# Patient Record
Sex: Male | Born: 1960
Health system: Southern US, Community
[De-identification: ages and names within clinical notes are randomized; demographics above are authoritative.]

## PROBLEM LIST (undated history)

## (undated) ENCOUNTER — Emergency Department (HOSPITAL_COMMUNITY): Admission: EM | Disposition: A | Discharge status: Home / Self Care | Payer: Medicaid Other

## (undated) DIAGNOSIS — C099 Malignant neoplasm of tonsil, unspecified: Secondary | ICD-10-CM

## (undated) DIAGNOSIS — I724 Aneurysm of artery of lower extremity: Secondary | ICD-10-CM

## (undated) DIAGNOSIS — D2360 Other benign neoplasm of skin of unspecified upper limb, including shoulder: Secondary | ICD-10-CM

## (undated) DIAGNOSIS — I10 Essential (primary) hypertension: Secondary | ICD-10-CM

## (undated) DIAGNOSIS — I4891 Unspecified atrial fibrillation: Secondary | ICD-10-CM

## (undated) DIAGNOSIS — R001 Bradycardia, unspecified: Secondary | ICD-10-CM

## (undated) DIAGNOSIS — C801 Malignant (primary) neoplasm, unspecified: Secondary | ICD-10-CM

## (undated) DIAGNOSIS — J189 Pneumonia, unspecified organism: Secondary | ICD-10-CM

## (undated) DIAGNOSIS — Z7901 Long term (current) use of anticoagulants: Secondary | ICD-10-CM

## (undated) DIAGNOSIS — E039 Hypothyroidism, unspecified: Secondary | ICD-10-CM

## (undated) DIAGNOSIS — I82819 Embolism and thrombosis of superficial veins of unspecified lower extremities: Secondary | ICD-10-CM

## (undated) DIAGNOSIS — D751 Secondary polycythemia: Secondary | ICD-10-CM

## (undated) DIAGNOSIS — D6859 Other primary thrombophilia: Secondary | ICD-10-CM

## (undated) DIAGNOSIS — E782 Mixed hyperlipidemia: Secondary | ICD-10-CM

## (undated) DIAGNOSIS — I743 Embolism and thrombosis of arteries of the lower extremities: Secondary | ICD-10-CM

## (undated) HISTORY — DX: Long term (current) use of anticoagulants: Z79.01

## (undated) HISTORY — DX: Other primary thrombophilia: D68.59

## (undated) HISTORY — DX: Aneurysm of artery of lower extremity: I72.4

## (undated) HISTORY — DX: Secondary polycythemia: D75.1

## (undated) HISTORY — DX: Embolism and thrombosis of superficial veins of unspecified lower extremity: I82.819

## (undated) HISTORY — DX: Bradycardia, unspecified: R00.1

## (undated) HISTORY — DX: Embolism and thrombosis of arteries of the lower extremities: I74.3

## (undated) HISTORY — DX: Mixed hyperlipidemia: E78.2

## (undated) HISTORY — DX: Other benign neoplasm of skin of unspecified upper limb, including shoulder: D23.60

## (undated) HISTORY — DX: Essential (primary) hypertension: I10

---

## 1998-02-26 ENCOUNTER — Emergency Department (HOSPITAL_COMMUNITY): Admission: EM | Admit: 1998-02-26 | Discharge: 1998-02-26 | Payer: Self-pay | Admitting: Emergency Medicine

## 1998-07-19 ENCOUNTER — Inpatient Hospital Stay (HOSPITAL_COMMUNITY): Admission: EM | Admit: 1998-07-19 | Discharge: 1998-07-27 | Payer: Self-pay | Admitting: Emergency Medicine

## 1998-07-19 ENCOUNTER — Encounter: Payer: Self-pay | Admitting: Emergency Medicine

## 2002-01-11 ENCOUNTER — Ambulatory Visit: Admission: RE | Admit: 2002-01-11 | Discharge: 2002-01-11 | Payer: Self-pay | Admitting: Oncology

## 2004-05-20 ENCOUNTER — Ambulatory Visit: Payer: Self-pay | Admitting: Oncology

## 2004-05-20 ENCOUNTER — Inpatient Hospital Stay (HOSPITAL_COMMUNITY): Admission: EM | Admit: 2004-05-20 | Discharge: 2004-05-22 | Payer: Self-pay

## 2004-05-21 ENCOUNTER — Encounter (INDEPENDENT_AMBULATORY_CARE_PROVIDER_SITE_OTHER): Payer: Self-pay | Admitting: Interventional Cardiology

## 2004-07-11 ENCOUNTER — Ambulatory Visit: Payer: Self-pay | Admitting: Oncology

## 2004-09-18 ENCOUNTER — Ambulatory Visit: Payer: Self-pay | Admitting: Oncology

## 2004-11-19 ENCOUNTER — Ambulatory Visit: Payer: Self-pay | Admitting: Oncology

## 2004-11-28 ENCOUNTER — Ambulatory Visit (HOSPITAL_COMMUNITY): Admission: RE | Admit: 2004-11-28 | Discharge: 2004-11-28 | Payer: Self-pay | Admitting: Oncology

## 2005-01-21 ENCOUNTER — Ambulatory Visit: Payer: Self-pay | Admitting: Oncology

## 2005-05-01 ENCOUNTER — Ambulatory Visit: Payer: Self-pay | Admitting: Oncology

## 2005-05-20 ENCOUNTER — Ambulatory Visit (HOSPITAL_COMMUNITY): Admission: RE | Admit: 2005-05-20 | Discharge: 2005-05-20 | Payer: Self-pay | Admitting: Oncology

## 2005-05-26 ENCOUNTER — Ambulatory Visit: Payer: Self-pay | Admitting: Oncology

## 2005-07-17 ENCOUNTER — Ambulatory Visit: Payer: Self-pay | Admitting: Oncology

## 2005-09-14 ENCOUNTER — Ambulatory Visit: Payer: Self-pay | Admitting: Oncology

## 2005-11-09 ENCOUNTER — Ambulatory Visit: Payer: Self-pay | Admitting: Oncology

## 2005-12-02 LAB — CBC WITH DIFFERENTIAL (CANCER CENTER ONLY)
BASO#: 0.1 10*3/uL (ref 0.0–0.2)
BASO%: 2.3 % — ABNORMAL HIGH (ref 0.0–2.0)
EOS%: 3.7 % (ref 0.0–7.0)
Eosinophils Absolute: 0.2 10*3/uL (ref 0.0–0.5)
HCT: 50.2 % — ABNORMAL HIGH (ref 38.7–49.9)
HGB: 17.3 g/dL — ABNORMAL HIGH (ref 13.0–17.1)
LYMPH#: 1.7 10*3/uL (ref 0.9–3.3)
LYMPH%: 29.7 % (ref 14.0–48.0)
MCH: 31.4 pg (ref 28.0–33.4)
MCHC: 34.5 g/dL (ref 32.0–35.9)
MCV: 91 fL (ref 82–98)
MONO#: 0.6 10*3/uL (ref 0.1–0.9)
MONO%: 9.6 % (ref 0.0–13.0)
NEUT#: 3.1 10*3/uL (ref 1.5–6.5)
NEUT%: 54.7 % (ref 40.0–80.0)
Platelets: 170 10*3/uL (ref 145–400)
RBC: 5.51 10*6/uL (ref 4.20–5.70)
RDW: 13.1 % (ref 10.5–14.6)
WBC: 5.7 10*3/uL (ref 4.0–10.0)

## 2005-12-02 LAB — PROTIME-INR (CHCC SATELLITE): INR: 3.4 (ref 2.0–3.5)

## 2006-01-26 ENCOUNTER — Ambulatory Visit: Payer: Self-pay | Admitting: Oncology

## 2006-01-27 ENCOUNTER — Encounter: Payer: Self-pay | Admitting: Vascular Surgery

## 2006-01-27 ENCOUNTER — Ambulatory Visit: Payer: Self-pay | Admitting: Oncology

## 2006-01-27 ENCOUNTER — Ambulatory Visit (HOSPITAL_COMMUNITY): Admission: RE | Admit: 2006-01-27 | Discharge: 2006-01-27 | Payer: Self-pay | Admitting: Oncology

## 2006-01-27 LAB — CBC WITH DIFFERENTIAL (CANCER CENTER ONLY)
BASO#: 0.1 10*3/uL (ref 0.0–0.2)
BASO%: 1.3 % (ref 0.0–2.0)
EOS%: 3.9 % (ref 0.0–7.0)
HCT: 52.4 % — ABNORMAL HIGH (ref 38.7–49.9)
HGB: 17.2 g/dL — ABNORMAL HIGH (ref 13.0–17.1)
LYMPH#: 1.7 10*3/uL (ref 0.9–3.3)
LYMPH%: 33.1 % (ref 14.0–48.0)
MCH: 31.2 pg (ref 28.0–33.4)
MCHC: 32.7 g/dL (ref 32.0–35.9)
MCV: 95 fL (ref 82–98)
MONO%: 6.8 % (ref 0.0–13.0)
NEUT%: 54.9 % (ref 40.0–80.0)
RDW: 12.2 % (ref 10.5–14.6)

## 2006-01-27 LAB — PROTIME-INR (CHCC SATELLITE)

## 2006-02-01 LAB — PROTIME-INR: Protime: 16.7 Seconds — ABNORMAL HIGH (ref 10.6–13.4)

## 2006-02-08 LAB — PROTIME-INR (CHCC SATELLITE): Protime: 17.8 Seconds — ABNORMAL HIGH (ref 10.6–13.4)

## 2006-03-02 LAB — CBC WITH DIFFERENTIAL (CANCER CENTER ONLY)
BASO#: 0.3 10*3/uL — ABNORMAL HIGH (ref 0.0–0.2)
BASO%: 4.3 % — ABNORMAL HIGH (ref 0.0–2.0)
EOS%: 4 % (ref 0.0–7.0)
Eosinophils Absolute: 0.2 10*3/uL (ref 0.0–0.5)
HCT: 51.3 % — ABNORMAL HIGH (ref 38.7–49.9)
HGB: 17.4 g/dL — ABNORMAL HIGH (ref 13.0–17.1)
LYMPH#: 1.8 10*3/uL (ref 0.9–3.3)
LYMPH%: 31.8 % (ref 14.0–48.0)
MCH: 31 pg (ref 28.0–33.4)
MCHC: 33.8 g/dL (ref 32.0–35.9)
MCV: 92 fL (ref 82–98)
MONO#: 0.5 10*3/uL (ref 0.1–0.9)
MONO%: 8.7 % (ref 0.0–13.0)
NEUT#: 3 10*3/uL (ref 1.5–6.5)
NEUT%: 51.2 % (ref 40.0–80.0)
Platelets: 166 10*3/uL (ref 145–400)
RBC: 5.61 10*6/uL (ref 4.20–5.70)
RDW: 13.2 % (ref 10.5–14.6)
WBC: 5.8 10*3/uL (ref 4.0–10.0)

## 2006-03-02 LAB — PROTIME-INR (CHCC SATELLITE)

## 2006-04-22 ENCOUNTER — Ambulatory Visit: Payer: Self-pay | Admitting: Oncology

## 2006-05-18 LAB — COMPREHENSIVE METABOLIC PANEL
ALT: 24 U/L (ref 0–40)
AST: 18 U/L (ref 0–37)
Albumin: 3.8 g/dL (ref 3.5–5.2)
BUN: 12 mg/dL (ref 6–23)
CO2: 29 mEq/L (ref 19–32)
Calcium: 9.6 mg/dL (ref 8.4–10.5)
Chloride: 105 mEq/L (ref 96–112)
Potassium: 4.5 mEq/L (ref 3.5–5.3)

## 2006-05-18 LAB — LACTATE DEHYDROGENASE: LDH: 146 U/L (ref 94–250)

## 2006-05-18 LAB — PROTIME-INR (CHCC SATELLITE): INR: 2.2 (ref 2.0–3.5)

## 2006-05-18 LAB — CBC WITH DIFFERENTIAL (CANCER CENTER ONLY)
BASO%: 3.7 % — ABNORMAL HIGH (ref 0.0–2.0)
Eosinophils Absolute: 0.2 10*3/uL (ref 0.0–0.5)
HCT: 50.2 % — ABNORMAL HIGH (ref 38.7–49.9)
LYMPH#: 1.5 10*3/uL (ref 0.9–3.3)
LYMPH%: 24.8 % (ref 14.0–48.0)
MCV: 92 fL (ref 82–98)
MONO#: 0.5 10*3/uL (ref 0.1–0.9)
Platelets: 180 10*3/uL (ref 145–400)
RBC: 5.47 10*6/uL (ref 4.20–5.70)
WBC: 6.2 10*3/uL (ref 4.0–10.0)

## 2006-05-18 LAB — MORPHOLOGY - CHCC SATELLITE: PLT EST ~~LOC~~: ADEQUATE

## 2006-05-25 ENCOUNTER — Ambulatory Visit: Payer: Self-pay | Admitting: Oncology

## 2006-06-07 ENCOUNTER — Ambulatory Visit (HOSPITAL_COMMUNITY): Admission: RE | Admit: 2006-06-07 | Discharge: 2006-06-07 | Payer: Self-pay | Admitting: Oncology

## 2006-07-09 ENCOUNTER — Ambulatory Visit: Payer: Self-pay | Admitting: Oncology

## 2006-09-20 ENCOUNTER — Ambulatory Visit: Payer: Self-pay | Admitting: Oncology

## 2006-09-20 LAB — PROTIME-INR (CHCC SATELLITE)
INR: 2 (ref 2.0–3.5)
Protime: 24 Seconds — ABNORMAL HIGH (ref 10.6–13.4)

## 2006-11-12 ENCOUNTER — Ambulatory Visit: Payer: Self-pay | Admitting: Oncology

## 2006-11-16 LAB — PROTIME-INR (CHCC SATELLITE)

## 2007-01-06 ENCOUNTER — Ambulatory Visit: Payer: Self-pay | Admitting: Oncology

## 2007-01-06 ENCOUNTER — Ambulatory Visit: Admission: RE | Admit: 2007-01-06 | Discharge: 2007-01-06 | Payer: Self-pay | Admitting: Oncology

## 2007-01-06 ENCOUNTER — Encounter: Payer: Self-pay | Admitting: Oncology

## 2007-01-06 ENCOUNTER — Ambulatory Visit: Payer: Self-pay | Admitting: Vascular Surgery

## 2007-01-06 LAB — PROTIME-INR

## 2007-01-13 ENCOUNTER — Ambulatory Visit: Payer: Self-pay | Admitting: Oncology

## 2007-01-17 LAB — PROTIME-INR (CHCC SATELLITE): INR: 2.8 (ref 2.0–3.5)

## 2007-03-10 ENCOUNTER — Ambulatory Visit: Payer: Self-pay | Admitting: Oncology

## 2007-03-18 LAB — CBC WITH DIFFERENTIAL (CANCER CENTER ONLY)
BASO#: 0.1 10*3/uL (ref 0.0–0.2)
BASO%: 2.1 % — ABNORMAL HIGH (ref 0.0–2.0)
EOS%: 4.7 % (ref 0.0–7.0)
HCT: 45.2 % (ref 38.7–49.9)
HGB: 15.5 g/dL (ref 13.0–17.1)
LYMPH#: 1.5 10*3/uL (ref 0.9–3.3)
MCH: 31.5 pg (ref 28.0–33.4)
MCHC: 34.2 g/dL (ref 32.0–35.9)
MONO%: 8.9 % (ref 0.0–13.0)
NEUT#: 2.6 10*3/uL (ref 1.5–6.5)
NEUT%: 53.7 % (ref 40.0–80.0)
RDW: 12.8 % (ref 10.5–14.6)

## 2007-03-18 LAB — PROTIME-INR (CHCC SATELLITE)

## 2007-03-25 LAB — PROTIME-INR: Protime: 26.4 Seconds — ABNORMAL HIGH (ref 10.6–13.4)

## 2007-05-20 ENCOUNTER — Ambulatory Visit: Payer: Self-pay | Admitting: Oncology

## 2007-05-23 LAB — CBC WITH DIFFERENTIAL (CANCER CENTER ONLY)
BASO#: 0 10*3/uL (ref 0.0–0.2)
Eosinophils Absolute: 0.2 10*3/uL (ref 0.0–0.5)
HGB: 16.6 g/dL (ref 13.0–17.1)
LYMPH#: 1.9 10*3/uL (ref 0.9–3.3)
MONO#: 0.5 10*3/uL (ref 0.1–0.9)
MONO%: 9.1 % (ref 0.0–13.0)
NEUT#: 3 10*3/uL (ref 1.5–6.5)
Platelets: 186 10*3/uL (ref 145–400)
RBC: 5.21 10*6/uL (ref 4.20–5.70)
WBC: 5.6 10*3/uL (ref 4.0–10.0)

## 2007-05-23 LAB — COMPREHENSIVE METABOLIC PANEL
AST: 17 U/L (ref 0–37)
Alkaline Phosphatase: 68 U/L (ref 39–117)
BUN: 16 mg/dL (ref 6–23)
Glucose, Bld: 86 mg/dL (ref 70–99)
Sodium: 141 mEq/L (ref 135–145)
Total Bilirubin: 0.4 mg/dL (ref 0.3–1.2)

## 2007-05-23 LAB — PROTIME-INR (CHCC SATELLITE)
INR: 3.1 (ref 2.0–3.5)
Protime: 37.2 Seconds — ABNORMAL HIGH (ref 10.6–13.4)

## 2007-07-27 ENCOUNTER — Ambulatory Visit: Payer: Self-pay | Admitting: Oncology

## 2007-07-28 ENCOUNTER — Ambulatory Visit: Payer: Self-pay | Admitting: Oncology

## 2007-07-29 LAB — CBC WITH DIFFERENTIAL (CANCER CENTER ONLY)
BASO%: 1.2 % (ref 0.0–2.0)
EOS%: 2.9 % (ref 0.0–7.0)
HGB: 17.4 g/dL — ABNORMAL HIGH (ref 13.0–17.1)
LYMPH#: 1.7 10*3/uL (ref 0.9–3.3)
MCH: 31.6 pg (ref 28.0–33.4)
MCHC: 34.2 g/dL (ref 32.0–35.9)
MONO%: 8.8 % (ref 0.0–13.0)
NEUT#: 2.6 10*3/uL (ref 1.5–6.5)
Platelets: 181 10*3/uL (ref 145–400)

## 2007-07-29 LAB — PROTIME-INR (CHCC SATELLITE)
INR: 1.9 — ABNORMAL LOW (ref 2.0–3.5)
Protime: 22.8 Seconds — ABNORMAL HIGH (ref 10.6–13.4)

## 2007-08-01 LAB — LIPID PANEL
HDL: 36 mg/dL — ABNORMAL LOW (ref >39)
Triglycerides: 277 mg/dL — ABNORMAL HIGH (ref <150)

## 2007-09-13 ENCOUNTER — Ambulatory Visit: Payer: Self-pay | Admitting: Oncology

## 2007-09-22 ENCOUNTER — Ambulatory Visit: Payer: Self-pay | Admitting: Oncology

## 2007-09-23 LAB — PROTIME-INR (CHCC SATELLITE): INR: 2.2 (ref 2.0–3.5)

## 2007-11-24 ENCOUNTER — Ambulatory Visit: Payer: Self-pay | Admitting: Oncology

## 2007-11-25 LAB — CBC WITH DIFFERENTIAL (CANCER CENTER ONLY)
BASO%: 1 % (ref 0.0–2.0)
EOS%: 3.4 % (ref 0.0–7.0)
MCH: 30.7 pg (ref 28.0–33.4)
MCHC: 34.3 g/dL (ref 32.0–35.9)
MONO%: 9.5 % (ref 0.0–13.0)
NEUT#: 2.2 10*3/uL (ref 1.5–6.5)
Platelets: 176 10*3/uL (ref 145–400)
RBC: 5.3 10*6/uL (ref 4.20–5.70)
RDW: 13.2 % (ref 10.5–14.6)

## 2007-11-25 LAB — PROTIME-INR (CHCC SATELLITE)
INR: 2.7 (ref 2.0–3.5)
Protime: 32.4 Seconds — ABNORMAL HIGH (ref 10.6–13.4)

## 2008-01-25 ENCOUNTER — Ambulatory Visit: Payer: Self-pay | Admitting: Oncology

## 2008-01-31 LAB — CBC WITH DIFFERENTIAL (CANCER CENTER ONLY)
BASO%: 1.2 % (ref 0.0–2.0)
Eosinophils Absolute: 0.1 10*3/uL (ref 0.0–0.5)
MONO#: 0.4 10*3/uL (ref 0.1–0.9)
MONO%: 8.8 % (ref 0.0–13.0)
NEUT#: 2.5 10*3/uL (ref 1.5–6.5)
Platelets: 176 10*3/uL (ref 145–400)
RBC: 5.31 10*6/uL (ref 4.20–5.70)
RDW: 13.1 % (ref 10.5–14.6)
WBC: 4.6 10*3/uL (ref 4.0–10.0)

## 2008-01-31 LAB — PROTIME-INR (CHCC SATELLITE): Protime: 30 Seconds — ABNORMAL HIGH (ref 10.6–13.4)

## 2008-03-21 ENCOUNTER — Ambulatory Visit: Payer: Self-pay | Admitting: Oncology

## 2008-03-23 LAB — PROTIME-INR (CHCC SATELLITE)
INR: 3.9 — ABNORMAL HIGH (ref 2.0–3.5)
Protime: 46.8 Seconds — ABNORMAL HIGH (ref 10.6–13.4)

## 2008-03-30 LAB — PROTIME-INR (CHCC SATELLITE)
INR: 1.7 — ABNORMAL LOW (ref 2.0–3.5)
Protime: 20.4 Seconds — ABNORMAL HIGH (ref 10.6–13.4)

## 2008-04-09 LAB — PROTIME-INR (CHCC SATELLITE): Protime: 24 Seconds — ABNORMAL HIGH (ref 10.6–13.4)

## 2008-05-14 ENCOUNTER — Ambulatory Visit: Payer: Self-pay | Admitting: Oncology

## 2008-05-28 ENCOUNTER — Ambulatory Visit: Payer: Self-pay | Admitting: Oncology

## 2008-07-25 ENCOUNTER — Ambulatory Visit: Payer: Self-pay | Admitting: Oncology

## 2008-07-27 ENCOUNTER — Ambulatory Visit: Payer: Self-pay | Admitting: Oncology

## 2008-07-30 LAB — CMP (CANCER CENTER ONLY)
Albumin: 3.6 g/dL (ref 3.3–5.5)
Alkaline Phosphatase: 59 U/L (ref 26–84)
BUN, Bld: 13 mg/dL (ref 7–22)
Calcium: 9 mg/dL (ref 8.0–10.3)
Chloride: 106 mEq/L (ref 98–108)
Creat: 1 mg/dl (ref 0.6–1.2)
Glucose, Bld: 99 mg/dL (ref 73–118)
Potassium: 4.5 mEq/L (ref 3.3–4.7)

## 2008-07-30 LAB — LACTATE DEHYDROGENASE: LDH: 131 U/L (ref 94–250)

## 2008-07-30 LAB — CBC WITH DIFFERENTIAL (CANCER CENTER ONLY)
HCT: 49.7 % (ref 38.7–49.9)
MCH: 31 pg (ref 28.0–33.4)
MCHC: 33.9 g/dL (ref 32.0–35.9)
MCV: 92 fL (ref 82–98)
RDW: 12.6 % (ref 10.5–14.6)

## 2008-07-30 LAB — MANUAL DIFFERENTIAL (CHCC SATELLITE)
ALC: 1 10*3/uL (ref 0.9–3.3)
LYMPH: 19 % (ref 14–48)
SEG: 68 % (ref 40–75)

## 2008-07-30 LAB — PROTIME-INR (CHCC SATELLITE): Protime: 26.4 Seconds — ABNORMAL HIGH (ref 10.6–13.4)

## 2008-07-30 LAB — LIPID PANEL
HDL: 38 mg/dL — ABNORMAL LOW (ref >39)
Triglycerides: 269 mg/dL — ABNORMAL HIGH (ref <150)

## 2008-09-20 ENCOUNTER — Ambulatory Visit: Payer: Self-pay | Admitting: Oncology

## 2008-10-03 LAB — PROTIME-INR (CHCC SATELLITE)
INR: 1.9 — ABNORMAL LOW (ref 2.0–3.5)
Protime: 22.8 Seconds — ABNORMAL HIGH (ref 10.6–13.4)

## 2008-11-28 ENCOUNTER — Ambulatory Visit: Payer: Self-pay | Admitting: Oncology

## 2008-11-30 LAB — CBC WITH DIFFERENTIAL/PLATELET
Basophils Absolute: 0 10*3/uL (ref 0.0–0.1)
EOS%: 1.7 % (ref 0.0–7.0)
HCT: 51.3 % — ABNORMAL HIGH (ref 38.4–49.9)
HGB: 18.3 g/dL — ABNORMAL HIGH (ref 13.0–17.1)
MCH: 32 pg (ref 27.2–33.4)
MONO#: 0.6 10*3/uL (ref 0.1–0.9)
NEUT%: 57.7 % (ref 39.0–75.0)
lymph#: 1.7 10*3/uL (ref 0.9–3.3)

## 2008-11-30 LAB — COMPREHENSIVE METABOLIC PANEL
ALT: 15 U/L (ref 0–53)
AST: 16 U/L (ref 0–37)
Albumin: 4.1 g/dL (ref 3.5–5.2)
Alkaline Phosphatase: 89 U/L (ref 39–117)
BUN: 15 mg/dL (ref 6–23)
Calcium: 9.6 mg/dL (ref 8.4–10.5)
Chloride: 104 mEq/L (ref 96–112)
Potassium: 3.9 mEq/L (ref 3.5–5.3)
Sodium: 141 mEq/L (ref 135–145)

## 2008-11-30 LAB — LIPID PANEL
HDL: 46 mg/dL (ref >39)
LDL Cholesterol: 130 mg/dL — ABNORMAL HIGH (ref 0–99)

## 2008-11-30 LAB — PROTIME-INR: INR: 3 (ref 2.00–3.50)

## 2009-01-23 ENCOUNTER — Ambulatory Visit: Payer: Self-pay | Admitting: Oncology

## 2009-03-21 ENCOUNTER — Ambulatory Visit: Payer: Self-pay | Admitting: Oncology

## 2009-03-22 LAB — CBC WITH DIFFERENTIAL (CANCER CENTER ONLY)
BASO#: 0.2 10*3/uL (ref 0.0–0.2)
BASO%: 3.7 % — ABNORMAL HIGH (ref 0.0–2.0)
HCT: 52.3 % — ABNORMAL HIGH (ref 38.7–49.9)
LYMPH%: 29.1 % (ref 14.0–48.0)
MCV: 96 fL (ref 82–98)
MONO#: 0.5 10*3/uL (ref 0.1–0.9)
NEUT%: 55.2 % (ref 40.0–80.0)
RDW: 12.3 % (ref 10.5–14.6)
WBC: 5.2 10*3/uL (ref 4.0–10.0)

## 2009-03-22 LAB — PROTIME-INR (CHCC SATELLITE)

## 2009-05-15 ENCOUNTER — Ambulatory Visit: Payer: Self-pay | Admitting: Oncology

## 2009-05-24 LAB — PROTIME-INR (CHCC SATELLITE): INR: 2.1 (ref 2.0–3.5)

## 2009-07-09 ENCOUNTER — Ambulatory Visit: Payer: Self-pay | Admitting: Oncology

## 2009-08-01 LAB — PROTIME-INR (CHCC SATELLITE)

## 2009-08-15 ENCOUNTER — Ambulatory Visit: Payer: Self-pay | Admitting: Oncology

## 2009-08-16 LAB — PROTIME-INR (CHCC SATELLITE)
INR: 2.1 (ref 2.0–3.5)
Protime: 25.2 Seconds — ABNORMAL HIGH (ref 10.6–13.4)

## 2009-09-17 ENCOUNTER — Ambulatory Visit: Payer: Self-pay | Admitting: Oncology

## 2009-09-20 LAB — MANUAL DIFFERENTIAL (CHCC SATELLITE)
Eos: 2 % (ref 0–7)
RBC Comments: NORMAL
SEG: 54 % (ref 40–75)

## 2009-09-20 LAB — PROTIME-INR (CHCC SATELLITE)
INR: 2.6 (ref 2.0–3.5)
Protime: 31.2 Seconds — ABNORMAL HIGH (ref 10.6–13.4)

## 2009-09-20 LAB — CBC WITH DIFFERENTIAL (CANCER CENTER ONLY)
MCHC: 34 g/dL (ref 32.0–35.9)
Platelets: 162 10*3/uL (ref 145–400)
RDW: 12.6 % (ref 10.5–14.6)

## 2009-10-25 ENCOUNTER — Ambulatory Visit: Payer: Self-pay | Admitting: Oncology

## 2009-12-05 ENCOUNTER — Ambulatory Visit: Payer: Self-pay | Admitting: Oncology

## 2009-12-06 LAB — CBC WITH DIFFERENTIAL/PLATELET
BASO%: 1.3 % (ref 0.0–2.0)
EOS%: 3 % (ref 0.0–7.0)
HCT: 48.4 % (ref 38.4–49.9)
LYMPH%: 33 % (ref 14.0–49.0)
MCH: 33.1 pg (ref 27.2–33.4)
MCHC: 35.2 g/dL (ref 32.0–36.0)
MONO%: 9.3 % (ref 0.0–14.0)
NEUT%: 53.4 % (ref 39.0–75.0)
Platelets: 150 10*3/uL (ref 140–400)

## 2009-12-06 LAB — PROTIME-INR: Protime: 26.4 Seconds — ABNORMAL HIGH (ref 10.6–13.4)

## 2010-01-31 ENCOUNTER — Ambulatory Visit: Payer: Self-pay | Admitting: Oncology

## 2010-02-04 LAB — PROTIME-INR (CHCC SATELLITE): INR: 2.7 (ref 2.0–3.5)

## 2010-03-20 ENCOUNTER — Ambulatory Visit: Payer: Self-pay | Admitting: Oncology

## 2010-03-28 LAB — PROTIME-INR (CHCC SATELLITE): INR: 3.3 (ref 2.0–3.5)

## 2010-05-16 ENCOUNTER — Ambulatory Visit: Payer: Self-pay | Admitting: Oncology

## 2010-05-23 LAB — PROTIME-INR (CHCC SATELLITE): INR: 2.4 (ref 2.0–3.5)

## 2010-06-23 ENCOUNTER — Ambulatory Visit: Payer: Self-pay | Admitting: Oncology

## 2010-06-23 ENCOUNTER — Ambulatory Visit
Admission: RE | Admit: 2010-06-23 | Discharge: 2010-06-23 | Payer: Self-pay | Source: Home / Self Care | Admitting: Oncology

## 2010-06-23 ENCOUNTER — Encounter: Payer: Self-pay | Admitting: Oncology

## 2010-06-23 ENCOUNTER — Other Ambulatory Visit: Payer: Self-pay | Admitting: Oncology

## 2010-06-23 LAB — PROTIME-INR
INR: 2.6 (ref 2.00–3.50)
Protime: 31.2 Seconds — ABNORMAL HIGH (ref 10.6–13.4)

## 2010-08-29 ENCOUNTER — Other Ambulatory Visit: Payer: Self-pay | Admitting: Oncology

## 2010-08-29 ENCOUNTER — Encounter (HOSPITAL_BASED_OUTPATIENT_CLINIC_OR_DEPARTMENT_OTHER): Payer: Self-pay | Admitting: Oncology

## 2010-08-29 DIAGNOSIS — Z86718 Personal history of other venous thrombosis and embolism: Secondary | ICD-10-CM

## 2010-08-29 DIAGNOSIS — D45 Polycythemia vera: Secondary | ICD-10-CM

## 2010-08-29 DIAGNOSIS — Z7901 Long term (current) use of anticoagulants: Secondary | ICD-10-CM

## 2010-08-29 LAB — PROTIME-INR: Protime: 44.4 Seconds — ABNORMAL HIGH (ref 10.6–13.4)

## 2010-10-03 ENCOUNTER — Other Ambulatory Visit: Payer: Self-pay | Admitting: Oncology

## 2010-10-03 ENCOUNTER — Encounter (HOSPITAL_BASED_OUTPATIENT_CLINIC_OR_DEPARTMENT_OTHER): Payer: Self-pay | Admitting: Oncology

## 2010-10-03 DIAGNOSIS — Z86718 Personal history of other venous thrombosis and embolism: Secondary | ICD-10-CM

## 2010-10-03 DIAGNOSIS — Z7901 Long term (current) use of anticoagulants: Secondary | ICD-10-CM

## 2010-10-03 DIAGNOSIS — D45 Polycythemia vera: Secondary | ICD-10-CM

## 2010-10-03 LAB — CBC WITH DIFFERENTIAL/PLATELET
BASO%: 0.5 % (ref 0.0–2.0)
Basophils Absolute: 0 10*3/uL (ref 0.0–0.1)
EOS%: 3.4 % (ref 0.0–7.0)
HGB: 17.7 g/dL — ABNORMAL HIGH (ref 13.0–17.1)
MCH: 32.3 pg (ref 27.2–33.4)
RDW: 13.5 % (ref 11.0–14.6)
lymph#: 1.6 10*3/uL (ref 0.9–3.3)

## 2010-10-03 LAB — PROTIME-INR: INR: 1.7 — ABNORMAL LOW (ref 2.00–3.50)

## 2010-11-07 ENCOUNTER — Encounter (HOSPITAL_BASED_OUTPATIENT_CLINIC_OR_DEPARTMENT_OTHER): Payer: Self-pay | Admitting: Oncology

## 2010-11-07 ENCOUNTER — Other Ambulatory Visit: Payer: Self-pay | Admitting: Oncology

## 2010-11-07 DIAGNOSIS — D689 Coagulation defect, unspecified: Secondary | ICD-10-CM

## 2010-11-07 DIAGNOSIS — Z7901 Long term (current) use of anticoagulants: Secondary | ICD-10-CM

## 2010-11-07 DIAGNOSIS — Z86718 Personal history of other venous thrombosis and embolism: Secondary | ICD-10-CM

## 2010-11-07 DIAGNOSIS — D45 Polycythemia vera: Secondary | ICD-10-CM

## 2010-11-07 LAB — PROTIME-INR
INR: 3.3 (ref 2.00–3.50)
Protime: 39.6 Seconds — ABNORMAL HIGH (ref 10.6–13.4)

## 2010-12-05 ENCOUNTER — Other Ambulatory Visit: Payer: Self-pay | Admitting: Oncology

## 2010-12-05 ENCOUNTER — Encounter (HOSPITAL_BASED_OUTPATIENT_CLINIC_OR_DEPARTMENT_OTHER): Payer: Self-pay | Admitting: Oncology

## 2010-12-05 DIAGNOSIS — Z7901 Long term (current) use of anticoagulants: Secondary | ICD-10-CM

## 2010-12-05 DIAGNOSIS — D45 Polycythemia vera: Secondary | ICD-10-CM

## 2010-12-05 DIAGNOSIS — Z86718 Personal history of other venous thrombosis and embolism: Secondary | ICD-10-CM

## 2010-12-05 DIAGNOSIS — D689 Coagulation defect, unspecified: Secondary | ICD-10-CM

## 2010-12-05 LAB — CBC WITH DIFFERENTIAL/PLATELET
Basophils Absolute: 0 10*3/uL (ref 0.0–0.1)
Eosinophils Absolute: 0.2 10*3/uL (ref 0.0–0.5)
HCT: 49.8 % (ref 38.4–49.9)
MCV: 93.7 fL (ref 79.3–98.0)
MONO#: 0.5 10*3/uL (ref 0.1–0.9)
MONO%: 10.6 % (ref 0.0–14.0)
Platelets: 160 10*3/uL (ref 140–400)
RBC: 5.31 10*6/uL (ref 4.20–5.82)
RDW: 14.3 % (ref 11.0–14.6)
lymph#: 1.9 10*3/uL (ref 0.9–3.3)

## 2010-12-05 LAB — COMPREHENSIVE METABOLIC PANEL
Albumin: 4.5 g/dL (ref 3.5–5.2)
Alkaline Phosphatase: 72 U/L (ref 39–117)
BUN: 16 mg/dL (ref 6–23)
CO2: 22 mEq/L (ref 19–32)
Calcium: 9.8 mg/dL (ref 8.4–10.5)
Chloride: 105 mEq/L (ref 96–112)
Glucose, Bld: 98 mg/dL (ref 70–99)
Potassium: 4.6 mEq/L (ref 3.5–5.3)
Sodium: 140 mEq/L (ref 135–145)
Total Protein: 7 g/dL (ref 6.0–8.3)

## 2010-12-05 NOTE — Discharge Summary (Signed)
[name], [name]                ACCOUNT NO.:  [id number]   MEDICAL RECORD NO.:  [id number]          PATIENT TYPE:  INP   LOCATION:  4735                         FACILITY:  [facility]   PHYSICIAN:  Genene Churn. Granfortuna, M.D.DATE OF BIRTH:  05/25/1961   DATE OF ADMISSION:  05/20/2004  DATE OF DISCHARGE:  05/22/2004                                 DISCHARGE SUMMARY   HISTORY AND PHYSICAL:  Mr. [name] [name] is a pleasant 50 year old man with a  history of idiopathic recurrent pulmonary emboli occurring in December 1997,  January 1998, and October 1998, and again in December 1999.  Extensive  coagulopathy evaluation has been unremarkable.  He has been maintained on  full dose therapeutic Coumadin since December 1999 and has had no subsequent  thrombotic events.  He presented on the day of the current admission with a  24 hour history of atypical left chest pain.  The pain has a pleuritic  component, worse in the standing position, intermittent radiation down the  left arm, and intermittent diaphoresis.  Intermittent dyspnea.  No cough or  hemoptysis.   He has at least one coronary risk factor in that he is a heavy smoker for  many years.  He is normotensive, not diabetic.  Lipid status unknown.  No  family history of bleeding or chronic disorders or coronary disease.  He has  had chronic bradycardia with heart rates in the 50 to 60 range.  He also has  smokers polycythemia and has hemoglobins in the 17 to 18 gram range,  hematocrits in the 50 to 53 percent range.  Due to the possibility that his  symptoms were ischemic or cardiac in nature, he was admitted for further  evaluation.  Urgent CT scan of the chest with the pulmonary embolus protocol  done as an outpatient just prior to this admission showed no evidence for  acute pulmonary embolus and no infiltrates or effusions.   Initial exam revealed a healthy appearing man in no distress.  Blood  pressure 151/103, pulse 68 and regular,  respirations 20, temperature 98.1.  Lungs clear.  Regular rate and rhythm.  No murmurs, gallops, and rubs.  No  carotid bruits.  No focal neurologic deficits.   LABORATORY DATA:  Hemoglobin 17.5, hematocrit 51, white count 7,300, 62  neutrophils, platelets 217,000.  LDH 112, CK 94, MB fraction 94%, troponin  less than 0.1.  Protime 23.1, INR 3.6, on Coumadin 10 mg daily.  Electrocardiogram showed sinus bradycardia rate of 48, first degree AV  block, incomplete left bundle branch block, no acute ischemic change or  arrhythmia and probable left axis deviation.   HOSPITAL COURSE:  Anticoagulation was continued.  I elected to start him on  an antihypertensive drug pending further evaluation.  P.r.n. nitrates. and  Oxygen were administered.  A fasting lipid profile was obtained.  Cardiology  consultation was obtained.  Serial EKGs and cardiac enzymes excluded a  myocardial infarction.  Lipid profile did show elevated triglycerides at  541, HDL 29, LDL not calculated, cholesterol 205.  A transthoracic  echocardiogram was done May 21, 2004, this showed  normal left  ventricular  and right ventricular function, estimated left ventricular  ejection fraction 55-65%, no wall motion abnormalities.  There was some  dilatation of the right ventricle with mild reduction in the right  ventricular systolic function.  The aortic valve was trileaflet but the  aortic valve thickness was normal.  Mitral, pulmonic and tricuspid were  normal.  There was some dilatation of the right atrium.  No pericardial  effusion.  A stress Cardiolite study was done.  The patient was exercised to  stage IV and reported right lower extremity pain and numbness with shortness  of breath.  The test was stopped.  Occasional PVCs with exertion during the  first recovery were observed.  With administration of Adenosine, there was a  normal blood pressure and heart rate response, no ectopy.  The study was  felt to be grossly  normal.  The patient was felt stable for discharge at  that point for further evaluation as an outpatient.   The patient's primary care physician was notified of his admission.  He had  not seen the patient due to the patient's compliance problems for the last  five years.  However, he agreed to resume his medical care at this time.  A  smoking cessation consult was obtained prior to discharge.   Additional lab data obtained during this admission included a normal liver  profile, SGOT 19, SGPT 24, alkaline phos 80, bilirubin 0.9.  CBC with a  hemoglobin 12.7, hematocrit 38, white count 6100, platelets 150,000 on  November 3.   Overall, the patient's symptoms subsided, but he still had some residual  atypical chest pain.   CONSULTATIONS:  Cardiology.   PROCEDURES:  1.  Transesophageal echocardiogram.  2.  Adenosine Cardiolite study.   DIAGNOSIS:  1.  Atypical chest pain, pulmonary embolus and acute myocardial infarction      excluded.  2.  Asymptomatic bradycardia.  3.  Tobacco addiction.  4.  Hypertriglyceridemia.  5.  History of recurrent idiopathic pulmonary emboli on chronic Coumadin.  6.  History of smokers polycythemia with current hemoglobin lower than      baseline.   DISPOSITION:  Condition stable for discharge, resume regular activity and  regular diet.  Follow up in my office in one week.  Follow up with Dr.  Johnella Moloney and Dr. Darol Destine.   DISCHARGE MEDICATIONS:  Coumadin 10 mg daily, lipid lowering therapy to be  determined post discharge.  The patient became hypotensive on Diovan started  on admission.  Off anti-hypertensives, blood pressure remained normal for  the duration of the hospital course and it was not necessary to resume at  the time of discharge.      [name] [name]   JMG/MEDQ  D:  07/08/2004  T:  07/08/2004  Job:  [id number]   cc:   Darol Destine, M.D.   Candyce Churn, M.D. [address] [place]  [place] [address]  Fax:  [phone]

## 2010-12-05 NOTE — H&P (Signed)
NAME:  [name] [name], [name] [name]                ACCOUNT NO.:  [id number]   MEDICAL RECORD NO.:  [id number]          PATIENT TYPE:  OUT   LOCATION:  XRAY                         FACILITY:  [facility]   PHYSICIAN:  Genene Churn. Granfortuna, M.D.DATE OF BIRTH:  [birth year]   DATE OF ADMISSION:  05/20/2004  DATE OF DISCHARGE:                                HISTORY & PHYSICAL   CHIEF COMPLAINT:  Atypical left sided chest pain in a man with a history of  recurrent pulmonary embolus.   ____Jay______  is a 50 year old man who smokes a pack of cigarettes daily  for many years.  He has a history of idiopathic recurrent pulmonary embolus  which occurred in December 1997, January 1998, October 1998, and December  1999.  He has had no subsequent thrombotic events on chronic full dose  Coumadin anticoagulation, current dose 10 mg daily.  He now presents with a  24 hour history of atypical left chest pain which started around noon  yesterday at rest.  There is a pleuritic component.  The pain is worse in a  standing position.  The pain has been intermittent over the last 24 hours.  Last night the pain radiated down his left arm, and he had associated  diaphoresis.  His wife urged him to come to the hospital but he declined.  He went to work today.  He continued to have intermittent atypical left  sided chest pain with associated dyspnea.  I advised him to come to my  office for further evaluation.  He is not a diabetic.  He has not been  hypertensive to date.  His lipid status is unknown.  There is no family  history of bleeding or clotting disordersor coronary disease.  He does have  chronic bradycardia with heart rates in the 50-60 range.  He also has  smokers polycythemia and runs hemoglobins in the 17-18 gram range with  hematocrits in the 50-53% range.   PAST MEDICAL HISTORY:  As noted above.  He had a bronchoscopy, back in 1998,  which did not reveal any evidence for malignancy.  CT scans of the chest and  upper  abdomen were normal at that time.  No major surgical procedures.   No chronic medications except coumadin 10 mg daily.   No allergies.   FAMILY HISTORY:  Parents, two brothers and a sister alive and well.   SOCIAL HISTORY:  Married, two children, smokes one pack of cigarettes daily.  Drinks about a case of beer on weekends.  Remote use of cocaine.  No  intravenous drugs.   REVIEW OF SYSTEMS:  See HPI.   PHYSICAL EXAMINATION:  GENERAL:  Shows a healthy-appearing young man in no  distress.  VITAL SIGNS:  Blood pressure is 151/103, pulse is 68 and regular,  respirations 20, temperature 98.1.  SKIN/HAIR/NAILS:  Normal.  HEENT:  Pupils equal reactive to light.  Optic disks sharp.  Pharynx with no  erythema or exudate.  NECK:  Supple.  No thyromegaly.  Carotids 2+.  No bruits.  LUNGS:  Clear.  Resonant to per percussion.  HEART:  Regular  cardiac rhythm.  No murmur, gallop or rub.  ABDOMEN:  Soft and nontender.  No mass, no organomegaly.  EXTREMITIES:  No edema.  No calf tenderness.  NEUROLOGIC:  Mental status intact.  Cranial nerves intact.  Motor strength  5/5.  Reflexes 2+ symmetric.   LABS:  Hemoglobin 17.5, hematocrit 50.7, white count 7,300 with 62  neutrophils, 27 lymphs, 8 monos, platelet count 217,000.  BUN 18, creatinine  1.0.  Liver functions normal.  Potassium 4.4, LDH 112.  CK 94, MB 1.9%,  troponin less than 0.1.  Pro time 23.1, INR 3.6 on Coumadin 10 mg daily.   Electrocardiogram shows sinus bradycardia at rate of 48, first degree A-V  block, incomplete left bundle branch block, no acute ischemic change,  probable left axis deviation.   IMPRESSION:  1.  Atypical left chest pain.  I doubt that he is having a recurrent      pulmonary embolus with an INR of 3.6.  I am going to proceed with a      computed tomography scan of the chest at this time.  In view of the      bradycardia, and atypical chest pain, and his underlying risk factors,      he may in fact have an  underlying ischemic cardiomyopathy.  I feel      hospitalization is indicated to initiate a cardiology evaluation and      rule out myocardial infarction.  2.  Idiopathic coagulopathy, status post multiple episodes of pulmonary      embolus with no new events on chronic full dose Coumadin since December      1999.  3.  Smoker's polycythemia.  4.  Longstanding tobacco addiction.      Jame   JMG/MEDQ  D:  05/20/2004  T:  05/20/2004  Job:  [id number]   cc:   Armanda Magic, M.D.  [address]  [place], [place] [address]  Fax: [phone]   Candyce Churn, M.D.  [address] [place]  [place] [address]  Fax: [phone]   Marcelyn Bruins, M.D. [facility]

## 2010-12-05 NOTE — Consult Note (Signed)
NAMENYLAN, NEVEL                ACCOUNT NO.:  [id number]   MEDICAL RECORD NO.:  [id number]          PATIENT TYPE:  INP   LOCATION:  4735                         FACILITY:  [facility]   PHYSICIAN:  Quita Skye. Collman, MDDATE OF BIRTH:  1960-08-26   DATE OF CONSULTATION:  05/20/2004  DATE OF DISCHARGE:                                   CONSULTATION   HISTORY OF PRESENT ILLNESS:  [name] [name] is a 50 year old white man who was  admitted to [facility] for further evaluation of chest pain.   The patient, who has no past history of cardiac disease, presented to Dr.  Cyndie Chime with a 1-day history of chest pain.  The chest discomfort has  been intermittent in nature.  It is described as a vague discomfort in the  left anterior chest.  Occasionally, it radiates to his left arm.  It is not  associated with dyspnea, diaphoresis, or nausea.  It may occasionally be  exacerbated by deep inspiration or by standing erect.  There were no other  exacerbating or ameliorating factors.  It appears not to be related to  activity or meals.  It may last several minutes or hours, then resolve, and  then recur again for several minutes or hours.  He is free of chest  discomfort at this time.  It is not described as a pressure, tightness,  squeezing, or heaviness.   As noted, the patient has no past history of cardiac disease, including no  history of chest pain, myocardial infarction, coronary artery disease,  congestive heart failure, or arrhythmia.   PAST MEDICAL HISTORY:  Remarkable only for recurrent pulmonary emboli.  He  has been on chronic Coumadin for this problem.  He is also known to have a  baseline rhythm of sinus bradycardia in the 50s.   There is no history of hypertension, diabetes mellitus, dyslipidemia, or  family history of coronary artery disease.  He smokes one pack of cigarettes  per day.   MEDICATIONS:  Coumadin.   ALLERGIES:  None.   OPERATIONS:  None.   SOCIAL  HISTORY:  The patient is a Corporate investment banker.  He lives with his  wife.   REVIEW OF SYSTEMS:  Reveals no problems related to his head, eyes, ears,  nose, mouth, throat, lungs, gastrointestinal system, genitourinary system,  or extremities.  There is no history of neurologic or psychiatric disorder.  There is no history of fever, chills, or weight loss.   PHYSICAL EXAMINATION:  VITAL SIGNS:  Blood pressure 130/78, pulse 70 and  regular, respirations 20, temperature 97.7.  GENERAL:  The patient was a middle-aged white man in no discomfort.  He was  alert, oriented, appropriate, and responsive.  HEENT:  Head, eyes, nose, and mouth were normal.  NECK:  Without thyromegaly or adenopathy.  Carotid pulses were palpable  bilaterally and without bruits.  CARDIAC:  A normal S1 and S2.  There was no S3, S4, murmur, rub, or click.  Cardiac rhythm was regular.  CHEST:  No chest wall tenderness was noted.  LUNGS:  Clear.  ABDOMEN:  Soft and  nontender.  There was no mass, hepatosplenomegaly, bruit,  distention, rebound, guarding, or rigidity.  Bowel sounds were normal.  RECTAL/GENITAL:  Not performed, as they are not pertinent to the reason for  acute care hospitalization.  EXTREMITIES:  Without edema, deviation, or deformity.  Radial and dorsalis  pedis pulses were palpable bilaterally.  NEUROLOGIC:  Brief screening neurologic survey was unremarkable.   The electrocardiogram revealed sinus bradycardia and a nonspecific  intraventricular block.  There was also left axis deviation.  The tracing  was otherwise unremarkable.   IMPRESSION:  1.  Chest pain.  Rule out coronary artery disease, rule out pulmonary      embolus.  The chest pain is vague and difficult to characterize.  2.  History of recurrent pulmonary emboli.   PLAN:  1.  Telemetry.  2.  Serial cardiac enzymes.  3.  No heparin or Lovenox (INR is elevated due to Coumadin).  4.  Nitrol past.  5.  Fasting lipid profile.  6.   Echocardiogram.  7.  Chest CT to rule out pulmonary embolus.  8.  Discontinuation of smoking has been discussed.  9.  Further measures per Dr. Mayford Knife.       MSC/MEDQ  D:  05/20/2004  T:  05/21/2004  Job:  [id number]   cc:   Armanda Magic, M.D.  [address]  [place], [place] [address]  Fax: [phone]

## 2011-01-30 ENCOUNTER — Other Ambulatory Visit: Payer: Self-pay | Admitting: Oncology

## 2011-01-30 ENCOUNTER — Encounter (HOSPITAL_BASED_OUTPATIENT_CLINIC_OR_DEPARTMENT_OTHER): Payer: Self-pay | Admitting: Oncology

## 2011-01-30 DIAGNOSIS — Z86718 Personal history of other venous thrombosis and embolism: Secondary | ICD-10-CM

## 2011-01-30 DIAGNOSIS — D45 Polycythemia vera: Secondary | ICD-10-CM

## 2011-01-30 DIAGNOSIS — D689 Coagulation defect, unspecified: Secondary | ICD-10-CM

## 2011-01-30 DIAGNOSIS — Z7901 Long term (current) use of anticoagulants: Secondary | ICD-10-CM

## 2011-01-30 LAB — CBC WITH DIFFERENTIAL/PLATELET
Basophils Absolute: 0 10*3/uL (ref 0.0–0.1)
EOS%: 3.1 % (ref 0.0–7.0)
Eosinophils Absolute: 0.2 10*3/uL (ref 0.0–0.5)
LYMPH%: 29.2 % (ref 14.0–49.0)
MCH: 32.4 pg (ref 27.2–33.4)
MCV: 93.3 fL (ref 79.3–98.0)
MONO%: 10.9 % (ref 0.0–14.0)
NEUT#: 2.9 10*3/uL (ref 1.5–6.5)
Platelets: 138 10*3/uL — ABNORMAL LOW (ref 140–400)
RBC: 5.36 10*6/uL (ref 4.20–5.82)

## 2011-01-30 LAB — PROTIME-INR: Protime: 57.6 Seconds — ABNORMAL HIGH (ref 10.6–13.4)

## 2011-02-02 LAB — HEPATIC FUNCTION PANEL
ALT: 99 U/L — ABNORMAL HIGH (ref 0–53)
AST: 45 U/L — ABNORMAL HIGH (ref 0–37)
Alkaline Phosphatase: 76 U/L (ref 39–117)
Bilirubin, Direct: 0.1 mg/dL (ref 0.0–0.3)
Indirect Bilirubin: 0.4 mg/dL (ref 0.0–0.9)

## 2011-02-02 LAB — LIPID PANEL
Cholesterol: 226 mg/dL — ABNORMAL HIGH (ref 0–200)
VLDL: 78 mg/dL — ABNORMAL HIGH (ref 0–40)

## 2011-02-02 LAB — HEPATITIS PANEL, ACUTE: Hep B C IgM: NEGATIVE

## 2011-02-16 ENCOUNTER — Encounter (HOSPITAL_BASED_OUTPATIENT_CLINIC_OR_DEPARTMENT_OTHER): Payer: Self-pay | Admitting: Oncology

## 2011-02-16 ENCOUNTER — Other Ambulatory Visit: Payer: Self-pay | Admitting: Oncology

## 2011-02-16 DIAGNOSIS — Z5181 Encounter for therapeutic drug level monitoring: Secondary | ICD-10-CM

## 2011-02-16 DIAGNOSIS — D45 Polycythemia vera: Secondary | ICD-10-CM

## 2011-02-16 DIAGNOSIS — D689 Coagulation defect, unspecified: Secondary | ICD-10-CM

## 2011-02-16 DIAGNOSIS — Z7901 Long term (current) use of anticoagulants: Secondary | ICD-10-CM

## 2011-02-16 DIAGNOSIS — Z86718 Personal history of other venous thrombosis and embolism: Secondary | ICD-10-CM

## 2011-02-16 LAB — PROTIME-INR
INR: 1.7 — ABNORMAL LOW (ref 2.00–3.50)
Protime: 20.4 Seconds — ABNORMAL HIGH (ref 10.6–13.4)

## 2011-03-30 ENCOUNTER — Other Ambulatory Visit: Payer: Self-pay | Admitting: Oncology

## 2011-03-30 ENCOUNTER — Encounter (HOSPITAL_BASED_OUTPATIENT_CLINIC_OR_DEPARTMENT_OTHER): Payer: Self-pay | Admitting: Oncology

## 2011-03-30 DIAGNOSIS — Z86718 Personal history of other venous thrombosis and embolism: Secondary | ICD-10-CM

## 2011-03-30 DIAGNOSIS — Z7901 Long term (current) use of anticoagulants: Secondary | ICD-10-CM

## 2011-03-30 DIAGNOSIS — D689 Coagulation defect, unspecified: Secondary | ICD-10-CM

## 2011-03-30 DIAGNOSIS — D45 Polycythemia vera: Secondary | ICD-10-CM

## 2011-03-30 LAB — PROTIME-INR
INR: 2.6 (ref 2.00–3.50)
Protime: 31.2 Seconds — ABNORMAL HIGH (ref 10.6–13.4)

## 2011-05-18 ENCOUNTER — Encounter (HOSPITAL_BASED_OUTPATIENT_CLINIC_OR_DEPARTMENT_OTHER): Payer: Self-pay | Admitting: Oncology

## 2011-05-18 ENCOUNTER — Other Ambulatory Visit: Payer: Self-pay | Admitting: Oncology

## 2011-05-18 DIAGNOSIS — Z86718 Personal history of other venous thrombosis and embolism: Secondary | ICD-10-CM

## 2011-05-18 DIAGNOSIS — D689 Coagulation defect, unspecified: Secondary | ICD-10-CM

## 2011-05-18 DIAGNOSIS — Z7901 Long term (current) use of anticoagulants: Secondary | ICD-10-CM

## 2011-05-18 LAB — PROTIME-INR
INR: 1.6 — ABNORMAL LOW (ref 2.00–3.50)
Protime: 19.2 Seconds — ABNORMAL HIGH (ref 10.6–13.4)

## 2011-05-26 DIAGNOSIS — I2699 Other pulmonary embolism without acute cor pulmonale: Secondary | ICD-10-CM | POA: Insufficient documentation

## 2011-06-08 ENCOUNTER — Ambulatory Visit: Payer: Self-pay

## 2011-06-08 ENCOUNTER — Other Ambulatory Visit (HOSPITAL_BASED_OUTPATIENT_CLINIC_OR_DEPARTMENT_OTHER): Payer: Self-pay | Admitting: Lab

## 2011-06-08 ENCOUNTER — Other Ambulatory Visit: Payer: Self-pay | Admitting: Oncology

## 2011-06-08 DIAGNOSIS — D689 Coagulation defect, unspecified: Secondary | ICD-10-CM

## 2011-06-08 DIAGNOSIS — Z7901 Long term (current) use of anticoagulants: Secondary | ICD-10-CM

## 2011-06-08 DIAGNOSIS — Z86718 Personal history of other venous thrombosis and embolism: Secondary | ICD-10-CM

## 2011-06-08 DIAGNOSIS — I2699 Other pulmonary embolism without acute cor pulmonale: Secondary | ICD-10-CM

## 2011-06-08 LAB — POCT INR: INR: 1.9

## 2011-06-08 LAB — PROTIME-INR: INR: 1.9 — ABNORMAL LOW (ref 2.00–3.50)

## 2011-06-08 NOTE — Progress Notes (Signed)
No problems.  Has missed a dose of Coumadin recently.  Will continue current dose.  Recheck INR in 2 months.

## 2011-06-30 ENCOUNTER — Other Ambulatory Visit: Payer: Self-pay | Admitting: Pharmacist

## 2011-06-30 DIAGNOSIS — I2699 Other pulmonary embolism without acute cor pulmonale: Secondary | ICD-10-CM

## 2011-07-02 ENCOUNTER — Other Ambulatory Visit: Payer: Self-pay | Admitting: *Deleted

## 2011-07-02 DIAGNOSIS — Z86718 Personal history of other venous thrombosis and embolism: Secondary | ICD-10-CM

## 2011-07-02 DIAGNOSIS — E785 Hyperlipidemia, unspecified: Secondary | ICD-10-CM

## 2011-07-02 DIAGNOSIS — D45 Polycythemia vera: Secondary | ICD-10-CM

## 2011-07-02 MED ORDER — WARFARIN SODIUM 10 MG PO TABS: ORAL_TABLET | ORAL | Status: DC

## 2011-08-03 ENCOUNTER — Ambulatory Visit: Payer: Self-pay | Admitting: Oncology

## 2011-08-03 ENCOUNTER — Telehealth: Payer: Self-pay | Admitting: *Deleted

## 2011-08-03 ENCOUNTER — Ambulatory Visit: Payer: Self-pay

## 2011-08-03 ENCOUNTER — Other Ambulatory Visit (HOSPITAL_BASED_OUTPATIENT_CLINIC_OR_DEPARTMENT_OTHER): Payer: Self-pay | Admitting: Lab

## 2011-08-03 DIAGNOSIS — I2699 Other pulmonary embolism without acute cor pulmonale: Secondary | ICD-10-CM

## 2011-08-03 LAB — POCT INR: INR: 2.3

## 2011-08-03 LAB — PROTIME-INR
INR: 2.3 (ref 2.00–3.50)
Protime: 27.6 Seconds — ABNORMAL HIGH (ref 10.6–13.4)

## 2011-08-03 NOTE — Patient Instructions (Signed)
Continue 10mg  daily and recheck INR in 2 months. Call pharmacist if any new prescriptions received from today's MD visit.

## 2011-08-03 NOTE — Telephone Encounter (Signed)
Verbal order received and read back from Dr. Cyndie Chime that he can work patient in at 1:00 if he is willing to wait. Called and left message at 10:20 am requesting a return call if he is willing to wait to add to MD schedule.  Awaiting return call from pt.

## 2011-08-03 NOTE — Progress Notes (Unsigned)
Continue 10mg  daily. Recheck INR in 2 months. Call pharmacist at [phone] if given any new prescriptions at MD visit today.

## 2011-08-03 NOTE — Telephone Encounter (Signed)
Patient's live-in girlfriend, Lyna Poser RN called.  Asked if Dr. Cyndie Chime or another MD can come to see [name] [name] when he comes in later today for Coumadin Clinic f/u.  She just got home from work and Lawayne is in pain.  Pain started over the weekend.  He is having constipated bm's and we don't know what's wrong... his bowels may be twisted.  Pain is to his left abdomen and it moves to his back.  We're using miralax. Patient being followed here for P.E.  Instructed to call PCP.  Patient has not maintained f/u with PCP so instructed to try [facility] or establish him At [facility].

## 2011-09-02 ENCOUNTER — Other Ambulatory Visit: Payer: Self-pay

## 2011-09-02 DIAGNOSIS — E785 Hyperlipidemia, unspecified: Secondary | ICD-10-CM

## 2011-09-02 DIAGNOSIS — D45 Polycythemia vera: Secondary | ICD-10-CM

## 2011-09-02 DIAGNOSIS — Z86718 Personal history of other venous thrombosis and embolism: Secondary | ICD-10-CM

## 2011-09-02 MED ORDER — WARFARIN SODIUM 10 MG PO TABS: ORAL_TABLET | ORAL | Status: DC

## 2011-09-02 NOTE — Telephone Encounter (Signed)
Called [facility] and left refill information on voicemail at 5:25 pm.

## 2011-09-08 ENCOUNTER — Other Ambulatory Visit: Payer: Self-pay | Admitting: Pharmacist

## 2011-09-08 DIAGNOSIS — I2699 Other pulmonary embolism without acute cor pulmonale: Secondary | ICD-10-CM

## 2011-09-28 ENCOUNTER — Ambulatory Visit (HOSPITAL_BASED_OUTPATIENT_CLINIC_OR_DEPARTMENT_OTHER): Payer: Self-pay | Admitting: Pharmacist

## 2011-09-28 ENCOUNTER — Other Ambulatory Visit (HOSPITAL_BASED_OUTPATIENT_CLINIC_OR_DEPARTMENT_OTHER): Payer: Self-pay | Admitting: Lab

## 2011-09-28 DIAGNOSIS — I2699 Other pulmonary embolism without acute cor pulmonale: Secondary | ICD-10-CM

## 2011-09-28 LAB — PROTIME-INR: Protime: 28.8 Seconds — ABNORMAL HIGH (ref 10.6–13.4)

## 2011-09-28 NOTE — Progress Notes (Signed)
INR therapeutic (2.4).  Currently on Coumadin 10mg  daily.  No complaints.  Will continue current dose and recheck INR in 2 months.

## 2011-10-15 ENCOUNTER — Inpatient Hospital Stay: Payer: Self-pay | Admitting: Internal Medicine

## 2011-10-15 LAB — CK TOTAL AND CKMB (NOT AT ARMC): CK-MB: 3.4 ng/mL (ref 0.5–3.6)

## 2011-10-15 LAB — URINALYSIS, COMPLETE
Bacteria: NONE SEEN
Blood: NEGATIVE
Glucose,UR: NEGATIVE mg/dL (ref 0–75)
Leukocyte Esterase: NEGATIVE
Nitrite: NEGATIVE
Protein: NEGATIVE
Squamous Epithelial: NONE SEEN
WBC UR: NONE SEEN /HPF (ref 0–5)

## 2011-10-15 LAB — PROTIME-INR
INR: 1.4
Prothrombin Time: 17.5 secs — ABNORMAL HIGH (ref 11.5–14.7)

## 2011-10-15 LAB — CBC WITH DIFFERENTIAL/PLATELET
Basophil #: 0.1 10*3/uL (ref 0.0–0.1)
Basophil %: 0.7 %
HCT: 51.9 % (ref 40.0–52.0)
HGB: 17.6 g/dL (ref 13.0–18.0)
Lymphocyte %: 40.2 %
MCH: 32.1 pg (ref 26.0–34.0)
MCHC: 33.9 g/dL (ref 32.0–36.0)
Monocyte #: 0.9 10*3/uL — ABNORMAL HIGH (ref 0.0–0.7)
Monocyte %: 11.5 %
Neutrophil #: 3.7 10*3/uL (ref 1.4–6.5)
Neutrophil %: 45.6 %
Platelet: 190 10*3/uL (ref 150–440)
RDW: 14.2 % (ref 11.5–14.5)
WBC: 8.1 10*3/uL (ref 3.8–10.6)

## 2011-10-15 LAB — COMPREHENSIVE METABOLIC PANEL
Alkaline Phosphatase: 81 U/L (ref 50–136)
Bilirubin,Total: 0.4 mg/dL (ref 0.2–1.0)
Calcium, Total: 8.9 mg/dL (ref 8.5–10.1)
Chloride: 105 mmol/L (ref 98–107)
Co2: 27 mmol/L (ref 21–32)
Glucose: 88 mg/dL (ref 65–99)
SGOT(AST): 44 U/L — ABNORMAL HIGH (ref 15–37)
SGPT (ALT): 79 U/L — ABNORMAL HIGH
Sodium: 142 mmol/L (ref 136–145)
Total Protein: 7.7 g/dL (ref 6.4–8.2)

## 2011-10-15 LAB — ETHANOL: Ethanol: 97 mg/dL

## 2011-10-15 LAB — TROPONIN I
Troponin-I: <0.02 ng/mL
Troponin-I: <0.02 ng/mL

## 2011-10-15 LAB — TSH: Thyroid Stimulating Horm: 3.35 u[IU]/mL

## 2011-11-23 ENCOUNTER — Ambulatory Visit (HOSPITAL_BASED_OUTPATIENT_CLINIC_OR_DEPARTMENT_OTHER): Payer: Self-pay | Admitting: Pharmacist

## 2011-11-23 ENCOUNTER — Telehealth: Payer: Self-pay | Admitting: Oncology

## 2011-11-23 ENCOUNTER — Other Ambulatory Visit (HOSPITAL_BASED_OUTPATIENT_CLINIC_OR_DEPARTMENT_OTHER): Payer: Self-pay | Admitting: Lab

## 2011-11-23 DIAGNOSIS — I2699 Other pulmonary embolism without acute cor pulmonale: Secondary | ICD-10-CM

## 2011-11-23 LAB — PROTIME-INR: INR: 2 (ref 2.00–3.50)

## 2011-11-23 NOTE — Telephone Encounter (Signed)
pt came by and scheduled pending appt for may in [place] for [email or web address].

## 2011-11-23 NOTE — Progress Notes (Signed)
Continue 10mg  daily.  Recheck INR in 6 weeks with next scheduled MD appointment; 01/04/12 @ 10:30am.

## 2011-11-23 NOTE — Patient Instructions (Addendum)
Continue 10mg daily.  Recheck INR in 6 weeks with next scheduled MD appointment; 01/04/12 @ 10:30am.  

## 2011-12-03 ENCOUNTER — Telehealth: Payer: Self-pay | Admitting: *Deleted

## 2011-12-03 NOTE — Telephone Encounter (Signed)
Received call from pt. reporting that he has an abscessed tooth (molar) & has seen his dentist & is on an ATB as of last hs & plans to have tooth pulled & wants to know when to stop coumadin.  This has not been scheduled yet.  Call back # is [phone].  Note to Dr Cyndie Chime.

## 2011-12-04 ENCOUNTER — Encounter: Payer: Self-pay | Admitting: Pharmacist

## 2011-12-04 ENCOUNTER — Telehealth: Payer: Self-pay

## 2011-12-04 NOTE — Progress Notes (Signed)
Spoke w/ pt over the phone after Vicente Males, RN brought me a note re: pts tooth abscess & planned extraction.  Dr. Patsy Lager instructions were for pt to hold Coumadin for 2 doses. I contacted pt by phone & he is on Augmentin.  He has been on it for "a few days".  Pt understands there is a risk the Augmentin can slightly increase INR but since pt will be holding his Coumadin soon for extraction, I did not alter his Coumadin dose.  Pt knows to call us if he notices bleeding.  If so, we can have him come in for INR check. Pt knows to resume Coumadin the evening of his extraction at usual dose. Marily Lente, Pharm.D.

## 2011-12-04 NOTE — Telephone Encounter (Signed)
Notified pt per Dr Cyndie Chime -   Hold Coumadin 2 days prior to procedure.   Restart evening of procedure.    Note to Coumadin Clinic. Dr Patsy Lager recommendation faxed to Dr Lucila Maine [phone].  dph

## 2011-12-31 ENCOUNTER — Other Ambulatory Visit: Payer: Self-pay | Admitting: *Deleted

## 2011-12-31 DIAGNOSIS — Z86718 Personal history of other venous thrombosis and embolism: Secondary | ICD-10-CM

## 2011-12-31 DIAGNOSIS — D45 Polycythemia vera: Secondary | ICD-10-CM

## 2011-12-31 DIAGNOSIS — E785 Hyperlipidemia, unspecified: Secondary | ICD-10-CM

## 2011-12-31 MED ORDER — WARFARIN SODIUM 10 MG PO TABS: ORAL_TABLET | ORAL | Status: DC

## 2012-01-04 ENCOUNTER — Ambulatory Visit (HOSPITAL_BASED_OUTPATIENT_CLINIC_OR_DEPARTMENT_OTHER): Payer: Self-pay | Admitting: Oncology

## 2012-01-04 ENCOUNTER — Telehealth: Payer: Self-pay | Admitting: Oncology

## 2012-01-04 ENCOUNTER — Ambulatory Visit: Payer: Self-pay | Admitting: Pharmacist

## 2012-01-04 ENCOUNTER — Other Ambulatory Visit (HOSPITAL_BASED_OUTPATIENT_CLINIC_OR_DEPARTMENT_OTHER): Payer: Self-pay | Admitting: Lab

## 2012-01-04 ENCOUNTER — Encounter: Payer: Self-pay | Admitting: Oncology

## 2012-01-04 VITALS — BP 139/98 | HR 62 | Temp 97.3°F | Ht 76.0 in | Wt 256.4 lb

## 2012-01-04 DIAGNOSIS — D689 Coagulation defect, unspecified: Secondary | ICD-10-CM

## 2012-01-04 DIAGNOSIS — I10 Essential (primary) hypertension: Secondary | ICD-10-CM

## 2012-01-04 DIAGNOSIS — R001 Bradycardia, unspecified: Secondary | ICD-10-CM

## 2012-01-04 DIAGNOSIS — E782 Mixed hyperlipidemia: Secondary | ICD-10-CM

## 2012-01-04 DIAGNOSIS — Z86711 Personal history of pulmonary embolism: Secondary | ICD-10-CM

## 2012-01-04 DIAGNOSIS — I2699 Other pulmonary embolism without acute cor pulmonale: Secondary | ICD-10-CM

## 2012-01-04 DIAGNOSIS — Z7901 Long term (current) use of anticoagulants: Secondary | ICD-10-CM

## 2012-01-04 DIAGNOSIS — E785 Hyperlipidemia, unspecified: Secondary | ICD-10-CM | POA: Insufficient documentation

## 2012-01-04 DIAGNOSIS — D751 Secondary polycythemia: Secondary | ICD-10-CM

## 2012-01-04 DIAGNOSIS — Z5181 Encounter for therapeutic drug level monitoring: Secondary | ICD-10-CM

## 2012-01-04 DIAGNOSIS — D45 Polycythemia vera: Secondary | ICD-10-CM

## 2012-01-04 HISTORY — DX: Bradycardia, unspecified: R00.1

## 2012-01-04 HISTORY — DX: Essential (primary) hypertension: I10

## 2012-01-04 HISTORY — DX: Mixed hyperlipidemia: E78.2

## 2012-01-04 HISTORY — DX: Secondary polycythemia: D75.1

## 2012-01-04 LAB — COMPREHENSIVE METABOLIC PANEL
ALT: 50 U/L (ref 0–53)
AST: 46 U/L — ABNORMAL HIGH (ref 0–37)
Alkaline Phosphatase: 76 U/L (ref 39–117)
BUN: 16 mg/dL (ref 6–23)
Creatinine, Ser: 1.09 mg/dL (ref 0.50–1.35)
Potassium: 4.2 mEq/L (ref 3.5–5.3)

## 2012-01-04 LAB — CBC WITH DIFFERENTIAL/PLATELET
Basophils Absolute: 0 10*3/uL (ref 0.0–0.1)
Eosinophils Absolute: 0.2 10*3/uL (ref 0.0–0.5)
HCT: 51.2 % — ABNORMAL HIGH (ref 38.4–49.9)
HGB: 17.4 g/dL — ABNORMAL HIGH (ref 13.0–17.1)
LYMPH%: 27.4 % (ref 14.0–49.0)
MONO#: 0.5 10*3/uL (ref 0.1–0.9)
NEUT#: 2.9 10*3/uL (ref 1.5–6.5)
Platelets: 136 10*3/uL — ABNORMAL LOW (ref 140–400)
RBC: 5.42 10*6/uL (ref 4.20–5.82)
WBC: 5 10*3/uL (ref 4.0–10.3)

## 2012-01-04 LAB — POCT INR: INR: 3.2

## 2012-01-04 LAB — PROTIME-INR

## 2012-01-04 NOTE — Telephone Encounter (Signed)
Gave pt appt for lab on 01/18/12 and lab, MD appt for next year June 2014

## 2012-01-04 NOTE — Progress Notes (Signed)
INR = 3.2 on 10 mg/day No complaints/issues today. Pt states that he holds a dose of his Coumadin on his own each week.  He plans to hold today's dose. Continue same Coumadin dose.  I advised him not to hold today's dose.  Return in 6 weeks. Marily Lente, Pharm.D.

## 2012-01-05 NOTE — Progress Notes (Signed)
Hematology and Oncology Follow Up [name]  [name] [id number] 09-15-1960 50 y.o. 01/05/2012 10:11 AM   Principle Diagnosis: Encounter Diagnoses  Name Primary?  . Coagulopathy   . History of pulmonary embolus (PE)   . Benign essential HTN Yes  . Hyperlipidemia, mixed   . Polycythemia secondary to smoking   . Sinus bradycardia, chronic      Interim History:  Followup [name] for this 51 year old man on chronic Coumadin anticoagulation for recurrent idiopathic pulmonary emboli. He had pulmonary emboli in December 1997, January 1998, October 1998, and in December 1999. He had an episode of superficial phlebitis right lesser saphenous vein in July 2007 when Coumadin level was subtherapeutic. He has not had a major thrombotic event in over 10 years.  He has developed other medical problems over time with a symptomatic bradycardia, essential hypertension, and hyperlipidemia. He does not like taking medications and does not like seeing his primary internist for followup.  He reports that a few months ago he was having problems with constipation. He did schedule an appointment with Dr. Kevan Ny and was seen by his partner Dr. Valentina Lucks. Laxatives were prescribed. I don't have a record of any other testing that might have been done at that time. He has not had a colonoscopy.  He reports that he had a choking episode in April of this year when he choked on a glass of milk. He had difficulty catching his breath. He went to [facility] where he was found to have atrial fibrillation. He was given medication adenosine? And went back into sinus rhythm before he left the emergency department. He was started on Toprol-XL 25 mg daily but has only taken it sporadically usually every other day. He has had no more choking episodes and no palpitations. He denies any chest pain or chest pressure.  He recently divorced his wife of 26 years and is going out with another woman who is a  Engineer, civil (consulting).  Medications: reviewed  Allergies: No Known Allergies  Review of Systems: Constitutional:  No constitutional symptoms  Respiratory: No cough or dyspnea Cardiovascular:  See above Gastrointestinal: See above Genito-Urinary: No urinary tract symptoms Musculoskeletal: No muscle or bone pain Neurologic: No headache or change in vision Skin: No rash or ecchymosis Remaining ROS negative.  Physical Exam: Blood pressure 139/98, pulse 62, temperature 97.3 F (36.3 C), temperature source Oral, height 6\' 4"  (1.93 m), weight 256 lb 6.4 oz (116.302 kg). Wt Readings from Last 3 Encounters:  01/04/12 256 lb 6.4 oz (116.302 kg)     General appearance: Well-nourished Caucasian man [name] [name]: Chronic erythema of his face from secondary polycythemia, pharynx no erythema or exudate Lymph nodes: No lymphadenopathy Breasts: Lungs: Clear to auscultation resonant to percussion Heart: Regular rhythm no murmur or gallop Abdomen: Soft nontender no mass no organomegaly Extremities: No edema no calf tenderness Vascular: No cyanosis Neurologic: Mental status intact, cranial nerves intact, pupils equal round reactive to light, optic disc sharp, vessels normal, motor strength 5 over 5, reflexes 1+ symmetric. Skin: No rash or ecchymosis.  Lab Results: Lab Results  Component Value Date   WBC 5.0 01/04/2012   HGB 17.4* 01/04/2012   HCT 51.2* 01/04/2012   MCV 94.3 01/04/2012   PLT 136* 01/04/2012     Chemistry      Component Value Date/Time   NA 138 01/04/2012 1101   NA 139 07/30/2008 1121   K 4.2 01/04/2012 1101   K 4.5 07/30/2008 1121   CL 106 01/04/2012 1101  CL 106 07/30/2008 1121   CO2 25 01/04/2012 1101   CO2 27 07/30/2008 1121   BUN 16 01/04/2012 1101   BUN 13 07/30/2008 1121   CREATININE 1.09 01/04/2012 1101   CREATININE 1.0 07/30/2008 1121      Component Value Date/Time   CALCIUM 8.9 01/04/2012 1101   CALCIUM 9.0 07/30/2008 1121   ALKPHOS 76 01/04/2012 1101   ALKPHOS 59 07/30/2008 1121   AST  46* 01/04/2012 1101   AST 23 07/30/2008 1121   ALT 50 01/04/2012 1101   BILITOT 0.8 01/04/2012 1101   BILITOT 0.60 07/30/2008 1121       Radiological Studies: No results found.  Impression and Plan: #1. 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 Plan: Continue Coumadin  #2. Transient atrial fibrillation which he relates to a choking episode. I believe he does have underlying cardiac disease. He is strongly encouraged to followup with his general internist to determine whether he needs further evaluation.  #3. Chronic asymptomatic sinus bradycardia  #4. Essential hypertension. I'm going to take him off the Toprol-XL since he is only taking it sporadically anyway and since I don't think somebody with sinus bradycardia should be on a beta blocker without further advice from a cardiologist. I'm going to start him on Maxzide 37.5/25 mg and have him come back in 2 weeks to check a potassium level.  #5. Smoker's polycythemia in a man with tobacco addiction and no motivation to stop smoking.  #6. Recent problems with constipation. He is now 51 years old. He really should have an elective colonoscopy.  #7. Hypertriglyceridemia not currently on medication    CC:. Dr. Maryln Gottron Viviann Spare, MD 6/18/201310:11 AM

## 2012-01-18 ENCOUNTER — Other Ambulatory Visit: Payer: Self-pay | Admitting: Lab

## 2012-01-22 ENCOUNTER — Other Ambulatory Visit: Payer: Self-pay | Admitting: *Deleted

## 2012-01-22 DIAGNOSIS — I10 Essential (primary) hypertension: Secondary | ICD-10-CM

## 2012-01-22 MED ORDER — TRIAMTERENE-HCTZ 37.5-25 MG PO CAPS: 1.0000 | ORAL_CAPSULE | ORAL | Status: DC

## 2012-01-25 ENCOUNTER — Other Ambulatory Visit: Payer: Self-pay

## 2012-02-15 ENCOUNTER — Other Ambulatory Visit: Payer: Self-pay | Admitting: Lab

## 2012-02-15 ENCOUNTER — Ambulatory Visit: Payer: Self-pay

## 2012-02-19 ENCOUNTER — Telehealth: Payer: Self-pay | Admitting: Oncology

## 2012-02-19 NOTE — Telephone Encounter (Signed)
Returned pt's call re moved 8/5 appt to 8/12. appt is for lb/CC - message sent to coumadin clinic. lmonvm for pt informing him.

## 2012-02-22 ENCOUNTER — Ambulatory Visit: Payer: Self-pay

## 2012-02-22 ENCOUNTER — Other Ambulatory Visit: Payer: Self-pay | Admitting: Lab

## 2012-02-22 ENCOUNTER — Telehealth: Payer: Self-pay | Admitting: Pharmacist

## 2012-02-22 NOTE — Telephone Encounter (Signed)
Left VM for patient. Ok to reschedule lab and Coumadin clinic to 02/29/12. Lab = 11:45am Coumadin clinic = 12:00pm.

## 2012-02-29 ENCOUNTER — Other Ambulatory Visit (HOSPITAL_BASED_OUTPATIENT_CLINIC_OR_DEPARTMENT_OTHER): Payer: Self-pay | Admitting: Lab

## 2012-02-29 ENCOUNTER — Ambulatory Visit (HOSPITAL_BASED_OUTPATIENT_CLINIC_OR_DEPARTMENT_OTHER): Payer: Self-pay | Admitting: Pharmacist

## 2012-02-29 DIAGNOSIS — I2699 Other pulmonary embolism without acute cor pulmonale: Secondary | ICD-10-CM

## 2012-02-29 LAB — PROTIME-INR: INR: 2 (ref 2.00–3.50)

## 2012-02-29 NOTE — Progress Notes (Signed)
Continue 10mg  daily.  Recheck INR in 2 months on 05/02/12: lab = 11:45am and Coumadin clinic =12:00.

## 2012-02-29 NOTE — Patient Instructions (Signed)
Continue 10mg daily.  Recheck INR in 2 months on 05/02/12: lab = 11:45am and Coumadin clinic =12:00.  

## 2012-05-02 ENCOUNTER — Other Ambulatory Visit (HOSPITAL_BASED_OUTPATIENT_CLINIC_OR_DEPARTMENT_OTHER): Payer: Self-pay | Admitting: Lab

## 2012-05-02 ENCOUNTER — Ambulatory Visit (HOSPITAL_COMMUNITY)
Admission: RE | Admit: 2012-05-02 | Discharge: 2012-05-02 | Disposition: A | Discharge status: Home / Self Care | Payer: Self-pay | Source: Ambulatory Visit | Attending: Oncology | Admitting: Oncology

## 2012-05-02 ENCOUNTER — Ambulatory Visit (HOSPITAL_BASED_OUTPATIENT_CLINIC_OR_DEPARTMENT_OTHER): Payer: Self-pay | Admitting: Lab

## 2012-05-02 ENCOUNTER — Encounter: Payer: Self-pay | Admitting: Oncology

## 2012-05-02 ENCOUNTER — Encounter: Payer: Self-pay | Admitting: Nurse Practitioner

## 2012-05-02 ENCOUNTER — Telehealth: Payer: Self-pay | Admitting: Oncology

## 2012-05-02 ENCOUNTER — Ambulatory Visit (HOSPITAL_BASED_OUTPATIENT_CLINIC_OR_DEPARTMENT_OTHER): Payer: Self-pay

## 2012-05-02 ENCOUNTER — Ambulatory Visit: Payer: Self-pay | Admitting: Pharmacist

## 2012-05-02 ENCOUNTER — Ambulatory Visit (HOSPITAL_BASED_OUTPATIENT_CLINIC_OR_DEPARTMENT_OTHER): Payer: Self-pay | Admitting: Nurse Practitioner

## 2012-05-02 VITALS — BP 148/99 | HR 64 | Temp 96.9°F | Resp 20 | Ht 76.0 in | Wt 265.5 lb

## 2012-05-02 DIAGNOSIS — I82819 Embolism and thrombosis of superficial veins of unspecified lower extremities: Secondary | ICD-10-CM | POA: Insufficient documentation

## 2012-05-02 DIAGNOSIS — I2699 Other pulmonary embolism without acute cor pulmonale: Secondary | ICD-10-CM

## 2012-05-02 DIAGNOSIS — E782 Mixed hyperlipidemia: Secondary | ICD-10-CM

## 2012-05-02 DIAGNOSIS — M7989 Other specified soft tissue disorders: Secondary | ICD-10-CM

## 2012-05-02 DIAGNOSIS — M79609 Pain in unspecified limb: Secondary | ICD-10-CM | POA: Insufficient documentation

## 2012-05-02 DIAGNOSIS — I10 Essential (primary) hypertension: Secondary | ICD-10-CM

## 2012-05-02 DIAGNOSIS — Z7901 Long term (current) use of anticoagulants: Secondary | ICD-10-CM

## 2012-05-02 HISTORY — DX: Embolism and thrombosis of superficial veins of unspecified lower extremity: I82.819

## 2012-05-02 LAB — PROTIME-INR
INR: 3.2 (ref 2.00–3.50)
Protime: 38.4 Seconds — ABNORMAL HIGH (ref 10.6–13.4)

## 2012-05-02 LAB — D-DIMER, QUANTITATIVE: D-Dimer, Quant: 0.38 ug/mL-FEU (ref 0.00–0.48)

## 2012-05-02 LAB — POCT INR: INR: 3.2

## 2012-05-02 LAB — BASIC METABOLIC PANEL (CC13)
BUN: 14 mg/dL (ref 7.0–26.0)
CO2: 24 mEq/L (ref 22–29)
Glucose: 95 mg/dl (ref 70–99)
Potassium: 4.6 mEq/L (ref 3.5–5.1)

## 2012-05-02 LAB — LIPID PANEL: LDL Cholesterol: 108 mg/dL — ABNORMAL HIGH (ref 0–99)

## 2012-05-02 MED ORDER — ENOXAPARIN SODIUM 120 MG/0.8ML ~~LOC~~ SOLN
120.0000 mg | Freq: Once | SUBCUTANEOUS | Status: AC
  Administered 2012-05-02: 120 mg via SUBCUTANEOUS
  Filled 2012-05-02: qty 0.8

## 2012-05-02 NOTE — Patient Instructions (Addendum)
Discontinue Coumadin. Begin Lovenox as instructed by Dr. Cyndie Chime and Misty Stanley. Return for follow up appointment on 05/06/12.

## 2012-05-02 NOTE — Progress Notes (Signed)
OFFICE PROGRESS NOTE  Interval history:  [name] [name] is a 51 year old man on chronic Coumadin anticoagulation for recurrent idiopathic pulmonary emboli. He had pulmonary emboli in December 1997, January 1998, October 1998 and December 1999. He had an episode of superficial phlebitis right lesser saphenous vein in July 2007 when the Coumadin level was subtherapeutic.  He presents to the office today for a visit with the Coumadin clinic. The Coumadin pharmacist requested evaluation due to a painful swollen left leg.  Of note, the PT is 38.4 with an INR of 3.2.  Mr. Tanori reports an approximate 3 week history of a "knot" at the left medial calf. Over the past 4-5 days he has noted pain at the left posterior lower calf. He has also noted swelling. More recently he has been experiencing discomfort at the left medial thigh region. He has been compliant with the Coumadin. He denies known injury to the leg.  He denies shortness of breath. No chest pain. No bleeding.  Over the past several months he has noted an enlarging nodule at the left forearm.   Objective: Temperature 96.9, heart rate 64, respirations 20, blood pressure 148/99.  Lungs are clear. Regular cardiac rhythm. Abdomen soft and nontender. No organomegaly. The left calf is markedly edematous. The left calf measures 45-1/2 cm. The right calf measures 43 cm. Palpable superficial vein at the left medial calf. He is tender along the left medial thigh. The left medial thigh is mildly erythematous. Left forearm with an approximate 1/2-1 cm flesh-colored nodular lesion.  Lab Results: Lab Results  Component Value Date   WBC 5.0 01/04/2012   HGB 17.4* 01/04/2012   HCT 51.2* 01/04/2012   MCV 94.3 01/04/2012   PLT 136* 01/04/2012    Chemistry:    Chemistry      Component Value Date/Time   NA 140 05/02/2012 1214   NA 138 01/04/2012 1101   NA 139 07/30/2008 1121   K 4.6 05/02/2012 1214   K 4.2 01/04/2012 1101   K 4.5 07/30/2008 1121   CL 107  05/02/2012 1214   CL 106 01/04/2012 1101   CL 106 07/30/2008 1121   CO2 24 05/02/2012 1214   CO2 25 01/04/2012 1101   CO2 27 07/30/2008 1121   BUN 14.0 05/02/2012 1214   BUN 16 01/04/2012 1101   BUN 13 07/30/2008 1121   CREATININE 0.9 05/02/2012 1214   CREATININE 1.09 01/04/2012 1101   CREATININE 1.0 07/30/2008 1121      Component Value Date/Time   CALCIUM 9.7 05/02/2012 1214   CALCIUM 8.9 01/04/2012 1101   CALCIUM 9.0 07/30/2008 1121   ALKPHOS 76 01/04/2012 1101   ALKPHOS 59 07/30/2008 1121   AST 46* 01/04/2012 1101   AST 23 07/30/2008 1121   ALT 50 01/04/2012 1101   BILITOT 0.8 01/04/2012 1101   BILITOT 0.60 07/30/2008 1121       Studies/Results: No results found.  Medications: I have reviewed the patient's current medications.  Assessment/Plan:  1. Extensive superficial thrombosis involving the left lower extremity greater saphenous vein with thrombus coursing from the distal calf to approximately 3 inches distal to the confluence with the common femoral vein. No evidence of a deep vein thrombosis.  2. Supratherapeutic PT/INR 05/02/2012. 3. Idiopathic coagulopathy with recurrent pulmonary emboli. 4. Recent rapid enlargement of a nodular lesion at the left forearm. 5. Chronic asymptomatic sinus bradycardia. 6. Essential hypertension. 7. Smokers polycythemia. 8. Hypertriglyceridemia.  Disposition-Mr. Maffeo has an extensive superficial thrombus involving the left leg. This  has occurred on supratherapeutic Coumadin. He will begin Lovenox 1 mg per kilogram every 12 hours and discontinue Coumadin.  In addition, he has a fairly rapidly enlarging lesion at the left forearm. 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 We recommended prompt dermatology evaluation. We will initiate a dermatology referral.  He will return for a followup visit on 05/06/2012. He will contact the office in the interim with any problems.  Patient seen with Dr. Cyndie Chime.  [name] ANP/GNP-BC

## 2012-05-02 NOTE — Progress Notes (Signed)
Patient successfully administered SQ Lovenox under supervision.  No questions at this time.

## 2012-05-02 NOTE — Progress Notes (Signed)
INR = 3.2 today. Severe swelling, redness, tenderness, pain, warmth and knots in left upper calf and lower thigh area. Doppler positive for superficial thrombus. Discontinue Coumadin. Begin Lovenox as instructed by Dr. Cyndie Chime and Misty Stanley. Return for follow up appointment on 05/06/12.

## 2012-05-02 NOTE — Telephone Encounter (Signed)
[name] [name] and scheduled doppler.Marland KitchenMarland KitchenMarland Kitchen

## 2012-05-02 NOTE — Progress Notes (Signed)
VASCULAR LAB PRELIMINARY  PRELIMINARY  PRELIMINARY  PRELIMINARY  Left lower extremity venous duplex completed.    Preliminary report:  No evidence of left lower extremity DVT or Baker's cyst. Positive for a superficial thrombus of the right greater saphenous vein coursing from the distal calf to approximately 3 in. Distal to the confluence with the common femoral vein.  Phillips Goulette, RVS 05/02/2012, 3:25 PM

## 2012-05-02 NOTE — Progress Notes (Signed)
Patient came in. Darlena gave him an application- and to return with bank statement and 2012 tax forms. Patient has no insurance at this time.

## 2012-05-03 ENCOUNTER — Encounter: Payer: Self-pay | Admitting: Oncology

## 2012-05-03 NOTE — Progress Notes (Signed)
Called patient and left message for call back. I need to see if he can fax his financial application to me prior to Friday. Per Misty Stanley wanted to see what he qualify for prior to that visit if at all possible.

## 2012-05-06 ENCOUNTER — Ambulatory Visit (HOSPITAL_BASED_OUTPATIENT_CLINIC_OR_DEPARTMENT_OTHER): Payer: Self-pay | Admitting: Nurse Practitioner

## 2012-05-06 ENCOUNTER — Telehealth: Payer: Self-pay | Admitting: Oncology

## 2012-05-06 ENCOUNTER — Other Ambulatory Visit (HOSPITAL_BASED_OUTPATIENT_CLINIC_OR_DEPARTMENT_OTHER): Payer: Self-pay

## 2012-05-06 VITALS — BP 142/89 | HR 83 | Temp 97.0°F | Resp 20 | Ht 76.0 in | Wt 263.2 lb

## 2012-05-06 DIAGNOSIS — I82819 Embolism and thrombosis of superficial veins of unspecified lower extremities: Secondary | ICD-10-CM

## 2012-05-06 DIAGNOSIS — I2699 Other pulmonary embolism without acute cor pulmonale: Secondary | ICD-10-CM

## 2012-05-06 DIAGNOSIS — L989 Disorder of the skin and subcutaneous tissue, unspecified: Secondary | ICD-10-CM

## 2012-05-06 DIAGNOSIS — Z7901 Long term (current) use of anticoagulants: Secondary | ICD-10-CM

## 2012-05-06 LAB — PROTIME-INR
INR: 1.2 — ABNORMAL LOW (ref 2.00–3.50)
Protime: 14.4 Seconds — ABNORMAL HIGH (ref 10.6–13.4)

## 2012-05-06 LAB — CBC WITH DIFFERENTIAL/PLATELET
BASO%: 0.2 % (ref 0.0–2.0)
Eosinophils Absolute: 0.1 10*3/uL (ref 0.0–0.5)
MCHC: 35.3 g/dL (ref 32.0–36.0)
MONO#: 0.4 10*3/uL (ref 0.1–0.9)
NEUT#: 1.9 10*3/uL (ref 1.5–6.5)
RBC: 5.39 10*6/uL (ref 4.20–5.82)
RDW: 13.5 % (ref 11.0–14.6)
WBC: 4 10*3/uL (ref 4.0–10.3)

## 2012-05-06 NOTE — Progress Notes (Signed)
OFFICE PROGRESS NOTE  Interval history:  [name] [name] returns as scheduled. He continues Lovenox 120 mg twice daily. He has noted improvement in the left leg pain and swelling. He is also noted decreased erythema at the medial left thigh. He denies bleeding. No shortness of breath or chest pain.  The nodular lesion at the left forearm has been present for approximately 3 months and has progressively increased in size.   Objective: Blood pressure 142/89, pulse 83, temperature 97 F (36.1 C), temperature source Oral, resp. rate 20, height 6\' 4"  (1.93 m), weight 263 lb 3.2 oz (119.387 kg).  Lungs are clear. Regular cardiac rhythm. Abdomen is soft and nontender. 1 cm firm, pink nodular lesion at the left forearm. No palpable left axillary lymph nodes. The left lower leg is edematous below the knee. The left calf measures 45 and half centimeters. The right calf measures 44 cm. Palpable superficial vein at the left medial calf. Mild tenderness along the left medial thigh. No erythema.  Lab Results: Lab Results  Component Value Date   WBC 4.0 05/06/2012   HGB 17.5* 05/06/2012   HCT 49.6 05/06/2012   MCV 92.0 05/06/2012   PLT 149 05/06/2012    Chemistry:    Chemistry      Component Value Date/Time   NA 140 05/02/2012 1214   NA 138 01/04/2012 1101   NA 139 07/30/2008 1121   K 4.6 05/02/2012 1214   K 4.2 01/04/2012 1101   K 4.5 07/30/2008 1121   CL 107 05/02/2012 1214   CL 106 01/04/2012 1101   CL 106 07/30/2008 1121   CO2 24 05/02/2012 1214   CO2 25 01/04/2012 1101   CO2 27 07/30/2008 1121   BUN 14.0 05/02/2012 1214   BUN 16 01/04/2012 1101   BUN 13 07/30/2008 1121   CREATININE 0.9 05/02/2012 1214   CREATININE 1.09 01/04/2012 1101   CREATININE 1.0 07/30/2008 1121      Component Value Date/Time   CALCIUM 9.7 05/02/2012 1214   CALCIUM 8.9 01/04/2012 1101   CALCIUM 9.0 07/30/2008 1121   ALKPHOS 76 01/04/2012 1101   ALKPHOS 59 07/30/2008 1121   AST 46* 01/04/2012 1101   AST 23 07/30/2008 1121   ALT 50 01/04/2012 1101   BILITOT 0.8 01/04/2012 1101   BILITOT 0.60 07/30/2008 1121       Studies/Results: No results found.  Medications: I have reviewed the patient's current medications.  Assessment/Plan:  1. Extensive superficial thrombosis involving the left lower extremity greater saphenous vein with thrombus coursing from the distal calf to approximately 3 inches distal to the confluence with the common femoral vein 05/02/2012. No evidence of a deep vein thrombosis. Coumadin was discontinued and he was started on therapeutic Lovenox. 2. Supratherapeutic PT/INR 05/02/2012. 3. Idiopathic coagulopathy with recurrent pulmonary emboli. 4. Recent rapid enlargement of a nodular lesion at the left forearm. A referral has been made to dermatology. 5. Chronic asymptomatic sinus bradycardia. 6. Essential hypertension. 7. Smokers polycythemia. 8. Hypertriglyceridemia.  Disposition-[name] [name] appears stable. Clinically the left leg is improved. He will continue therapeutic Lovenox.   We have made an urgent referral to dermatology for evaluation of the nodular skin lesion at the left forearm. He has been contacted by [facility] dermatology. He plans to call them back later today to schedule an appointment.  He will return for a followup visit here on 05/17/2012. He will contact the office in the interim with any problems.   Plan reviewed with Dr. Cyndie Chime.  [name]  ANP/GNP-BC

## 2012-05-06 NOTE — Telephone Encounter (Signed)
Gave pt appt for  October 2013 ML only

## 2012-05-17 ENCOUNTER — Ambulatory Visit (HOSPITAL_BASED_OUTPATIENT_CLINIC_OR_DEPARTMENT_OTHER): Payer: Self-pay | Admitting: Nurse Practitioner

## 2012-05-17 VITALS — BP 130/91 | HR 66 | Temp 97.0°F | Resp 20 | Ht 76.0 in | Wt 257.6 lb

## 2012-05-17 DIAGNOSIS — D751 Secondary polycythemia: Secondary | ICD-10-CM

## 2012-05-17 DIAGNOSIS — I82819 Embolism and thrombosis of superficial veins of unspecified lower extremities: Secondary | ICD-10-CM

## 2012-05-17 DIAGNOSIS — I2699 Other pulmonary embolism without acute cor pulmonale: Secondary | ICD-10-CM

## 2012-05-17 DIAGNOSIS — D680 Von Willebrand's disease: Secondary | ICD-10-CM

## 2012-05-17 NOTE — Progress Notes (Signed)
OFFICE PROGRESS NOTE  Interval history:  [name] [name] returns as scheduled. He continues Lovenox. He continues to note improvement in the left leg pain and swelling. He denies bleeding. No shortness of breath, cough or chest pain.  He reports recent evaluation of the nodular lesion at the left forearm by a dermatologist. No biopsy is planned per his report.   Objective: Blood pressure 130/91, pulse 66, temperature 97 F (36.1 C), temperature source Oral, resp. rate 20, height 6\' 4"  (1.93 m), weight 257 lb 9.6 oz (116.847 kg).  Posterior palate is erythematous. No ulcerations. Lungs are clear. Regular cardiac rhythm. Abdomen is soft and nontender. No hepatomegaly. Trace edema at the left lower leg. The left calf measures 44-1/2 cm the right calf measures 44 cm. Firm, nontender superficial vein at the left medial calf. 1 cm firm nodular lesion at the left forearm.  Lab Results: Lab Results  Component Value Date   WBC 4.0 05/06/2012   HGB 17.5* 05/06/2012   HCT 49.6 05/06/2012   MCV 92.0 05/06/2012   PLT 149 05/06/2012    Chemistry:    Chemistry      Component Value Date/Time   NA 140 05/02/2012 1214   NA 138 01/04/2012 1101   NA 139 07/30/2008 1121   K 4.6 05/02/2012 1214   K 4.2 01/04/2012 1101   K 4.5 07/30/2008 1121   CL 107 05/02/2012 1214   CL 106 01/04/2012 1101   CL 106 07/30/2008 1121   CO2 24 05/02/2012 1214   CO2 25 01/04/2012 1101   CO2 27 07/30/2008 1121   BUN 14.0 05/02/2012 1214   BUN 16 01/04/2012 1101   BUN 13 07/30/2008 1121   CREATININE 0.9 05/02/2012 1214   CREATININE 1.09 01/04/2012 1101   CREATININE 1.0 07/30/2008 1121      Component Value Date/Time   CALCIUM 9.7 05/02/2012 1214   CALCIUM 8.9 01/04/2012 1101   CALCIUM 9.0 07/30/2008 1121   ALKPHOS 76 01/04/2012 1101   ALKPHOS 59 07/30/2008 1121   AST 46* 01/04/2012 1101   AST 23 07/30/2008 1121   ALT 50 01/04/2012 1101   BILITOT 0.8 01/04/2012 1101   BILITOT 0.60 07/30/2008 1121       Studies/Results: No  results found.  Medications: I have reviewed the patient's current medications.  Assessment/Plan:  1. Extensive superficial thrombosis involving the left lower extremity greater saphenous vein with thrombus coursing from the distal calf to approximately 3 inches distal to the confluence with the common femoral vein 05/02/2012. No evidence of a deep vein thrombosis. Coumadin was discontinued and he was started on therapeutic Lovenox. 2. Supratherapeutic PT/INR 05/02/2012. 3. Idiopathic coagulopathy with recurrent pulmonary emboli. 4. Recent rapid enlargement of a nodular lesion at the left forearm. He is status post dermatology evaluation. We have not received the office note from that visit. Per [name] [name] report no biopsy is planned. 5. Chronic asymptomatic sinus bradycardia. 6. Essential hypertension. 7. Smokers polycythemia. 8. Hypertriglyceridemia.  Disposition-[name] [name] appears stable. The left leg is less edematous and less tender. Dr. Cyndie Chime recommends resuming Coumadin. He will continue Lovenox until the Coumadin is therapeutic. Dr. Cyndie Chime also recommends that he begin aspirin 81 mg daily. [name] [name] understands to contact the office if he notes recurrent symptoms. Otherwise he will keep his regularly scheduled followup visit.  Plan per Dr. Cyndie Chime.  [name] ANP/GNP-BC

## 2012-05-20 ENCOUNTER — Encounter: Payer: Self-pay | Admitting: Pharmacist

## 2012-05-20 ENCOUNTER — Telehealth: Payer: Self-pay | Admitting: Pharmacist

## 2012-05-20 NOTE — Progress Notes (Signed)
Pt was seen 10/29 by [name] for f/u given recent history of extensive superficial thrombosis involving the left lower extremity dating 05/02/12.  He was off Coumadin for a short time as INR was supratherapeutic at time of re-thrombosis.  He was placed on therapeutic Lovenox. Pt was told on 10/29 to start Coumadin 10 mg/day (previous maintenance dose) and continue Lovenox injections until INR at goal. Pt will return Mon 05/23/12 for protime. Marily Lente, Pharm.D.

## 2012-05-23 ENCOUNTER — Ambulatory Visit (HOSPITAL_BASED_OUTPATIENT_CLINIC_OR_DEPARTMENT_OTHER): Payer: Self-pay | Admitting: Pharmacist

## 2012-05-23 ENCOUNTER — Other Ambulatory Visit (HOSPITAL_BASED_OUTPATIENT_CLINIC_OR_DEPARTMENT_OTHER): Payer: Self-pay | Admitting: Lab

## 2012-05-23 DIAGNOSIS — I2699 Other pulmonary embolism without acute cor pulmonale: Secondary | ICD-10-CM

## 2012-05-23 DIAGNOSIS — I82819 Embolism and thrombosis of superficial veins of unspecified lower extremities: Secondary | ICD-10-CM

## 2012-05-23 DIAGNOSIS — Z7901 Long term (current) use of anticoagulants: Secondary | ICD-10-CM

## 2012-05-23 LAB — PROTIME-INR
INR: 1.7 — ABNORMAL LOW (ref 2.00–3.50)
Protime: 20.4 Seconds — ABNORMAL HIGH (ref 10.6–13.4)

## 2012-05-23 LAB — POCT INR: INR: 1.7

## 2012-05-23 NOTE — Progress Notes (Signed)
Pt resumed Coumadin 10mg  daily on 05/19/12 and INR is increasing toward goal of 2-3.  Pt will continue Coumadin 10mg  daily and Lovenox 180mg  daily.  Will check PT/INR on Friday 05/27/12.  Lovenox samples given to pt. (100mg  x 2 syr and 80mg  x 2 syr.)

## 2012-05-27 ENCOUNTER — Ambulatory Visit (HOSPITAL_BASED_OUTPATIENT_CLINIC_OR_DEPARTMENT_OTHER): Payer: Self-pay | Admitting: Pharmacist

## 2012-05-27 ENCOUNTER — Other Ambulatory Visit (HOSPITAL_BASED_OUTPATIENT_CLINIC_OR_DEPARTMENT_OTHER): Payer: Self-pay

## 2012-05-27 DIAGNOSIS — I2699 Other pulmonary embolism without acute cor pulmonale: Secondary | ICD-10-CM

## 2012-05-27 LAB — POCT INR: INR: 2.5

## 2012-05-27 NOTE — Progress Notes (Signed)
INR = 2.5 on Lovenox & Coumadin.  Coumadin dose back at previous maintenance = 10 mg/day. Bruising in abdomen from injections.  Healing nicely. INR at goal.  D/C Lovenox inj.  Remain on Coumadin 10 mg/day. Repeat INR in 2 weeks. Marily Lente, Pharm.D.

## 2012-06-13 ENCOUNTER — Other Ambulatory Visit (HOSPITAL_BASED_OUTPATIENT_CLINIC_OR_DEPARTMENT_OTHER): Payer: Self-pay | Admitting: Lab

## 2012-06-13 ENCOUNTER — Ambulatory Visit (HOSPITAL_BASED_OUTPATIENT_CLINIC_OR_DEPARTMENT_OTHER): Payer: Self-pay | Admitting: Pharmacist

## 2012-06-13 ENCOUNTER — Other Ambulatory Visit: Payer: Self-pay | Admitting: Lab

## 2012-06-13 DIAGNOSIS — I2699 Other pulmonary embolism without acute cor pulmonale: Secondary | ICD-10-CM

## 2012-06-13 LAB — PROTIME-INR
INR: 4.7 — ABNORMAL HIGH (ref 2.00–3.50)
Protime: 56.4 Seconds — ABNORMAL HIGH (ref 10.6–13.4)

## 2012-06-13 NOTE — Progress Notes (Signed)
INR supratherapeutic (4.7) on 10mg  daily. Pt thinks he may have taken an additional dose. No problems with bleeding or bruising.  No changes in meds or diet.  Will have pt hold dose x 2 days, then resume 10mg  daily.  Recheck INR on Friday, 06/17/12 to assess if INR has returned to therapeutic range.

## 2012-06-17 ENCOUNTER — Other Ambulatory Visit (HOSPITAL_BASED_OUTPATIENT_CLINIC_OR_DEPARTMENT_OTHER): Payer: Self-pay

## 2012-06-17 ENCOUNTER — Ambulatory Visit: Payer: Self-pay | Admitting: Pharmacist

## 2012-06-17 DIAGNOSIS — I2699 Other pulmonary embolism without acute cor pulmonale: Secondary | ICD-10-CM

## 2012-06-17 LAB — PROTIME-INR
INR: 1.5 — ABNORMAL LOW (ref 2.00–3.50)
Protime: 18 Seconds — ABNORMAL HIGH (ref 10.6–13.4)

## 2012-06-17 NOTE — Progress Notes (Signed)
INR subtherapeutic (1.5) after holding 2 doses on Monday, then resuming 10mg  daily. No other missed doses.  No problems with bleeding/bruising.  No s/s clotting.  Since pt has been therapeutic on 10mg  daily previously, will have pt continue Coumadin 10mg  daily for now.  Will recheck INR in ~1 week to assess for return to therapeutic range.

## 2012-06-17 NOTE — Patient Instructions (Signed)
Pt to call with request for appt time on 06/27/12 once he knows his schedule that day.  He is taking a friend to behavioral health sometime that day, and would like to schedule his lab/CC appt around that visit.

## 2012-06-22 ENCOUNTER — Telehealth: Payer: Self-pay | Admitting: Oncology

## 2012-06-22 NOTE — Telephone Encounter (Signed)
Returned pt's call re lb/CC for 12/9 around 12:30pm. lmonvm for pt to call me back as we cannot accommodate a 12:30pm appt time we will need to work out another time w/pt. Jenna in [place] aware.

## 2012-06-27 ENCOUNTER — Other Ambulatory Visit (HOSPITAL_BASED_OUTPATIENT_CLINIC_OR_DEPARTMENT_OTHER): Payer: Self-pay

## 2012-06-27 ENCOUNTER — Ambulatory Visit (HOSPITAL_BASED_OUTPATIENT_CLINIC_OR_DEPARTMENT_OTHER): Payer: Self-pay | Admitting: Pharmacist

## 2012-06-27 DIAGNOSIS — Z7901 Long term (current) use of anticoagulants: Secondary | ICD-10-CM

## 2012-06-27 DIAGNOSIS — I2699 Other pulmonary embolism without acute cor pulmonale: Secondary | ICD-10-CM

## 2012-06-27 NOTE — Progress Notes (Signed)
INR at goal on same coumadin dose that pt has been stable on, 10mg  daily.  Will continue and see pt back in 3-4 weeks.

## 2012-07-26 ENCOUNTER — Ambulatory Visit (HOSPITAL_BASED_OUTPATIENT_CLINIC_OR_DEPARTMENT_OTHER): Payer: Self-pay | Admitting: Pharmacist

## 2012-07-26 ENCOUNTER — Other Ambulatory Visit (HOSPITAL_BASED_OUTPATIENT_CLINIC_OR_DEPARTMENT_OTHER): Payer: Self-pay | Admitting: Lab

## 2012-07-26 DIAGNOSIS — I2699 Other pulmonary embolism without acute cor pulmonale: Secondary | ICD-10-CM

## 2012-07-26 LAB — PROTIME-INR
INR: 2.9 (ref 2.00–3.50)
Protime: 34.8 Seconds — ABNORMAL HIGH (ref 10.6–13.4)

## 2012-07-26 NOTE — Progress Notes (Signed)
INR therapeutic today (2.9) on 10mg  daily. No changes.  No complaints. Will have pt continue current dose, and recheck INR in 4 weeks.

## 2012-07-28 ENCOUNTER — Ambulatory Visit: Payer: Self-pay

## 2012-07-28 ENCOUNTER — Other Ambulatory Visit: Payer: Self-pay | Admitting: Lab

## 2012-08-05 ENCOUNTER — Other Ambulatory Visit: Payer: Self-pay | Admitting: Oncology

## 2012-08-22 ENCOUNTER — Other Ambulatory Visit: Payer: Self-pay

## 2012-08-22 ENCOUNTER — Ambulatory Visit: Payer: Self-pay

## 2012-08-23 ENCOUNTER — Ambulatory Visit (HOSPITAL_BASED_OUTPATIENT_CLINIC_OR_DEPARTMENT_OTHER): Payer: Self-pay | Admitting: Pharmacist

## 2012-08-23 ENCOUNTER — Other Ambulatory Visit (HOSPITAL_BASED_OUTPATIENT_CLINIC_OR_DEPARTMENT_OTHER): Payer: Self-pay

## 2012-08-23 DIAGNOSIS — I2699 Other pulmonary embolism without acute cor pulmonale: Secondary | ICD-10-CM

## 2012-08-23 LAB — PROTIME-INR: INR: 2 (ref 2.00–3.50)

## 2012-08-23 NOTE — Progress Notes (Signed)
Therapeutic today at 2.0.  With 3 goal INR's each 4 weeks apart I have pushed out his next appmt to 8 weeks. Continue Coumadin 10mg  daily. Recheck INR in 8 weeks on 10/17/12 at 1145 for lab, 1200 for Coumadin clinic

## 2012-08-23 NOTE — Patient Instructions (Signed)
Continue Coumadin 10mg  daily. Recheck INR in 8 weeks on 10/17/12 at 1145 for lab, 1200 for Coumadin clinic

## 2012-10-17 ENCOUNTER — Other Ambulatory Visit (HOSPITAL_BASED_OUTPATIENT_CLINIC_OR_DEPARTMENT_OTHER): Payer: Self-pay | Admitting: Lab

## 2012-10-17 ENCOUNTER — Ambulatory Visit (HOSPITAL_BASED_OUTPATIENT_CLINIC_OR_DEPARTMENT_OTHER): Payer: Self-pay | Admitting: Pharmacist

## 2012-10-17 DIAGNOSIS — I82812 Embolism and thrombosis of superficial veins of left lower extremities: Secondary | ICD-10-CM

## 2012-10-17 DIAGNOSIS — I2699 Other pulmonary embolism without acute cor pulmonale: Secondary | ICD-10-CM

## 2012-10-17 LAB — PROTIME-INR: INR: 2.9 (ref 2.00–3.50)

## 2012-10-17 LAB — POCT INR: INR: 2.9

## 2012-10-17 MED ORDER — WARFARIN SODIUM 10 MG PO TABS: 10.0000 mg | ORAL_TABLET | Freq: Every day | ORAL | Status: DC

## 2012-10-17 NOTE — Progress Notes (Signed)
INR = 2.9 on Coumadin 10 mg/day No new issues. Doing well. INR at goal.  No changes to dose necessary. Repeat INR 12/26/12. Ebony Hail, Pharm.D., CPP 10/17/2012@12 :13 PM

## 2012-12-26 ENCOUNTER — Ambulatory Visit: Payer: Self-pay

## 2012-12-26 ENCOUNTER — Other Ambulatory Visit: Payer: Self-pay | Admitting: Lab

## 2012-12-28 ENCOUNTER — Ambulatory Visit (HOSPITAL_BASED_OUTPATIENT_CLINIC_OR_DEPARTMENT_OTHER): Payer: Self-pay | Admitting: Pharmacist

## 2012-12-28 ENCOUNTER — Other Ambulatory Visit (HOSPITAL_BASED_OUTPATIENT_CLINIC_OR_DEPARTMENT_OTHER): Payer: Self-pay | Admitting: Lab

## 2012-12-28 DIAGNOSIS — R001 Bradycardia, unspecified: Secondary | ICD-10-CM

## 2012-12-28 DIAGNOSIS — I2699 Other pulmonary embolism without acute cor pulmonale: Secondary | ICD-10-CM

## 2012-12-28 DIAGNOSIS — E782 Mixed hyperlipidemia: Secondary | ICD-10-CM

## 2012-12-28 DIAGNOSIS — D751 Secondary polycythemia: Secondary | ICD-10-CM

## 2012-12-28 DIAGNOSIS — I10 Essential (primary) hypertension: Secondary | ICD-10-CM

## 2012-12-28 DIAGNOSIS — D689 Coagulation defect, unspecified: Secondary | ICD-10-CM

## 2012-12-28 DIAGNOSIS — Z86711 Personal history of pulmonary embolism: Secondary | ICD-10-CM

## 2012-12-28 LAB — PROTIME-INR
INR: 4.4 — ABNORMAL HIGH (ref 2.00–3.50)
Protime: 52.8 Seconds — ABNORMAL HIGH (ref 10.6–13.4)

## 2012-12-28 LAB — CBC WITH DIFFERENTIAL/PLATELET
BASO%: 0.3 % (ref 0.0–2.0)
Basophils Absolute: 0 10*3/uL (ref 0.0–0.1)
EOS%: 2.8 % (ref 0.0–7.0)
HGB: 18.1 g/dL — ABNORMAL HIGH (ref 13.0–17.1)
MCH: 31.8 pg (ref 27.2–33.4)
MCHC: 34.7 g/dL (ref 32.0–36.0)
MCV: 91.7 fL (ref 79.3–98.0)
MONO%: 9.3 % (ref 0.0–14.0)
RBC: 5.69 10*6/uL (ref 4.20–5.82)
RDW: 14.5 % (ref 11.0–14.6)
lymph#: 1.9 10*3/uL (ref 0.9–3.3)

## 2012-12-28 LAB — COMPREHENSIVE METABOLIC PANEL (CC13)
ALT: 26 U/L (ref 0–55)
AST: 18 U/L (ref 5–34)
BUN: 12.8 mg/dL (ref 7.0–26.0)
Calcium: 9.6 mg/dL (ref 8.4–10.4)
Creatinine: 0.9 mg/dL (ref 0.7–1.3)
Total Bilirubin: 0.25 mg/dL (ref 0.20–1.20)

## 2012-12-28 NOTE — Patient Instructions (Signed)
You already took your coumadin dose today. Hold coumadin on 12/29/12.  On 12/30/12, continue Coumadin 10mg  daily.  Recheck INR on 01/02/13 at 11:30 am for lab, 12:00pm for apt with Dr. Cyndie Chime and 12:30 pm for coumadin clinic.

## 2012-12-28 NOTE — Progress Notes (Signed)
INR above goal today. Pt had a couple beers last night while watching a band at a local bar.  He also feels he may have double up on a few doses of coumadin lately (when he couldn't remember if he had taken the dose or not). No problems to report regarding anticoagulation. Pt no longer taking Aspirin. No changes in diet. Pt had elevated INR on 06/13/12 at 4.7. He held 2 doses and then resumed 10mg  daily. INR rechecked on 06/17/12 and INR = 1.5 Pt took coumadin today. Will hold one dose of coumadin. Hold coumadin on 12/29/12.  On 12/30/12, continue Coumadin 10mg  daily.  Recheck INR on 01/02/13 at 11:30 am for lab, 12:00pm for apt with Dr. Cyndie Chime and 12:30 pm for coumadin clinic.

## 2013-01-02 ENCOUNTER — Ambulatory Visit (HOSPITAL_BASED_OUTPATIENT_CLINIC_OR_DEPARTMENT_OTHER): Payer: Self-pay | Admitting: Pharmacist

## 2013-01-02 ENCOUNTER — Telehealth: Payer: Self-pay | Admitting: Oncology

## 2013-01-02 ENCOUNTER — Ambulatory Visit (HOSPITAL_BASED_OUTPATIENT_CLINIC_OR_DEPARTMENT_OTHER): Payer: Self-pay | Admitting: Oncology

## 2013-01-02 ENCOUNTER — Encounter: Payer: Self-pay | Admitting: Oncology

## 2013-01-02 ENCOUNTER — Other Ambulatory Visit (HOSPITAL_BASED_OUTPATIENT_CLINIC_OR_DEPARTMENT_OTHER): Payer: Self-pay | Admitting: Lab

## 2013-01-02 VITALS — BP 149/103 | HR 65 | Temp 98.4°F | Resp 18 | Ht 76.0 in | Wt 255.9 lb

## 2013-01-02 DIAGNOSIS — I2699 Other pulmonary embolism without acute cor pulmonale: Secondary | ICD-10-CM

## 2013-01-02 DIAGNOSIS — Z7901 Long term (current) use of anticoagulants: Secondary | ICD-10-CM | POA: Insufficient documentation

## 2013-01-02 DIAGNOSIS — D2360 Other benign neoplasm of skin of unspecified upper limb, including shoulder: Secondary | ICD-10-CM

## 2013-01-02 DIAGNOSIS — I82812 Embolism and thrombosis of superficial veins of left lower extremities: Secondary | ICD-10-CM

## 2013-01-02 DIAGNOSIS — E782 Mixed hyperlipidemia: Secondary | ICD-10-CM

## 2013-01-02 DIAGNOSIS — I82819 Embolism and thrombosis of superficial veins of unspecified lower extremities: Secondary | ICD-10-CM

## 2013-01-02 DIAGNOSIS — I495 Sick sinus syndrome: Secondary | ICD-10-CM

## 2013-01-02 DIAGNOSIS — D751 Secondary polycythemia: Secondary | ICD-10-CM

## 2013-01-02 DIAGNOSIS — D2362 Other benign neoplasm of skin of left upper limb, including shoulder: Secondary | ICD-10-CM

## 2013-01-02 DIAGNOSIS — R001 Bradycardia, unspecified: Secondary | ICD-10-CM

## 2013-01-02 DIAGNOSIS — I10 Essential (primary) hypertension: Secondary | ICD-10-CM

## 2013-01-02 HISTORY — DX: Other benign neoplasm of skin of unspecified upper limb, including shoulder: D23.60

## 2013-01-02 HISTORY — DX: Long term (current) use of anticoagulants: Z79.01

## 2013-01-02 LAB — PROTIME-INR
INR: 3.1 (ref 2.00–3.50)
Protime: 37.2 Seconds — ABNORMAL HIGH (ref 10.6–13.4)

## 2013-01-02 LAB — POCT INR: INR: 3.1

## 2013-01-02 MED ORDER — AMLODIPINE BESYLATE 5 MG PO TABS: 5.0000 mg | ORAL_TABLET | Freq: Every day | ORAL | Status: DC

## 2013-01-02 NOTE — Patient Instructions (Signed)
INR at goal today at upper end)  NO changes  Continue same dose of 10 mg daily  Return in 8 weeks on 03/06/13 at noon for lab and 12:15pm for coumadin clinic

## 2013-01-02 NOTE — Progress Notes (Signed)
Hematology and Oncology Follow Up Visit  [name] [name] [id number] 02/01/1961 52 y.o. 01/02/2013 12:54 PM   Principle Diagnosis: Encounter Diagnoses  Name Primary?  . Pulmonary embolism   . Benign essential HTN Yes  . Polycythemia secondary to smoking   . Sinus bradycardia, chronic   . Superficial thrombosis of lower extremity, left   . Chronic anticoagulation   . Hyperlipidemia, mixed      Interim History:   Follow up visit for this pleasant but only intermittently compliant 52 year old man I have followed for many years in view of a idiopathic coagulopathy presenting with recurrent pulmonary emboli and DVTs. He is on long-term Coumadin anticoagulation and has not had a deep venous thrombosis in over 10 years. He did develop an extensive superficial thrombophlebitis left lower extremity In October 2013. Coumadin was temporarily discontinued and he was put on therapeutic doses of Lovenox.  At time of that evaluation he was found to have a cutaneous nodular lesion on his left forearm. We referred him to dermatology. I was concerned that this was a malignant process. He was evaluated by Dr. Arminda Resides. 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 He has ongoing followup with Dr. Yetta Barre. The lesion has not grown in the interval. Neither has it regressed.  At some point, he stopped his antihypertensives without medical advice.  He continues to smoke a few cigarettes a day.   Medications: reviewed  Allergies: No Known Allergies  Review of Systems: Constitutional:   No constitutional symptoms Respiratory: He denies any cough or dyspnea Cardiovascular:  He denies any chest pain or palpitations Gastrointestinal: Genito-Urinary:  Musculoskeletal: Neurologic: Skin: See above Remaining ROS negative.  Physical Exam: Blood pressure 149/103, pulse 65, temperature 98.4 F (36.9 C), temperature source Oral, resp. rate 18, height 6\' 4"  (1.93 m), weight 255 lb 14.4 oz (116.075 kg). Wt Readings  from Last 3 Encounters:  01/02/13 255 lb 14.4 oz (116.075 kg)  05/17/12 257 lb 9.6 oz (116.847 kg)  05/06/12 263 lb 3.2 oz (119.387 kg)     General appearance: Well-nourished Caucasian man HENNT: Pharynx no erythema or exudate, ruddy complexion Lymph nodes: No adenopathy Breasts: Lungs: Clear to auscultation resonant to percussion Heart: Regular rhythm no murmur Abdomen: Soft, nontender, no mass, no organomegaly Extremities: No edema, no calf tenderness Musculoskeletal: No joint deformities GU: Vascular: No cyanosis Neurologic: Grossly normal Skin: Persistent, raised, nodular nonpigmented lesion skin of the left forearm  Lab Results: Lab Results  Component Value Date   WBC 4.9 12/28/2012   HGB 18.1* 12/28/2012   HCT 52.2* 12/28/2012   MCV 91.7 12/28/2012   PLT 182 12/28/2012     Chemistry      Component Value Date/Time   NA 139 12/28/2012 1405   NA 138 01/04/2012 1101   NA 139 07/30/2008 1121   K 4.5 12/28/2012 1405   K 4.2 01/04/2012 1101   K 4.5 07/30/2008 1121   CL 108* 12/28/2012 1405   CL 106 01/04/2012 1101   CL 106 07/30/2008 1121   CO2 23 12/28/2012 1405   CO2 25 01/04/2012 1101   CO2 27 07/30/2008 1121   BUN 12.8 12/28/2012 1405   BUN 16 01/04/2012 1101   BUN 13 07/30/2008 1121   CREATININE 0.9 12/28/2012 1405   CREATININE 1.09 01/04/2012 1101   CREATININE 1.0 07/30/2008 1121      Component Value Date/Time   CALCIUM 9.6 12/28/2012 1405   CALCIUM 8.9 01/04/2012 1101   CALCIUM 9.0 07/30/2008 1121   ALKPHOS  88 12/28/2012 1405   ALKPHOS 76 01/04/2012 1101   ALKPHOS 59 07/30/2008 1121   AST 18 12/28/2012 1405   AST 46* 01/04/2012 1101   AST 23 07/30/2008 1121   ALT 26 12/28/2012 1405   ALT 50 01/04/2012 1101   BILITOT 0.25 12/28/2012 1405   BILITOT 0.8 01/04/2012 1101   BILITOT 0.60 07/30/2008 1121      Impression:   Plan: #1. Idiopathic coagulopathy on chronic Coumadin anticoagulation  #2. History of recurrent DVT and pulmonary emboli secondary to #1  #3. History of  superficial thrombophlebitis requiring temporary low molecular weight heparin October 2013  #4. Chronic asymptomatic sinus bradycardia  #5. History of transient atrial fibrillation occurring one year ago no recurrence.  #6. Essential hypertension. He needs to be back on medication. I'm going to prescribe Norvasc 5 mg daily. His current lady friend has a blood pressure cuff and works as a Water quality scientist. She will monitor his pressures and he will call us if he needs to have any adjustments in the medication dose. He continues come to our office every other month for lab work related to his Coumadin.  #7. Smoker's polycythemia  #8. Dermatofibroma left forearm  #9. Next hyperlipidemia with prominent hypertriglyceridemia currently not on medication  #10. Health maintenance he still has not had a baseline colonoscopy.   CC:. Dr. Marden Noble; Dr. Arminda Resides   Levert Feinstein, MD 6/16/201412:54 PM

## 2013-01-02 NOTE — Progress Notes (Signed)
INR just slightly above goal range at 3.1. Patient has been stable on 10 mg daily dose. He saw Dr. Cyndie Chime this afternoon and then patient was called and discussed INR results with patient over the phone as he had left after his MD appointment. Patient is doing well with no complaints. No bruising/bleeding or missed doses. Plan to continue 10 mg daily and see patient back in ~ 8 weeks on 03/06/13 at noon for lab and 12:15pm for coumadin clinic

## 2013-01-02 NOTE — Telephone Encounter (Signed)
Gave pt appt for lab and MD for June 2014 °

## 2013-01-13 ENCOUNTER — Telehealth: Payer: Self-pay | Admitting: Dietician

## 2013-02-07 ENCOUNTER — Other Ambulatory Visit: Payer: Self-pay | Admitting: Oncology

## 2013-03-06 ENCOUNTER — Other Ambulatory Visit: Payer: Self-pay | Admitting: Lab

## 2013-03-06 ENCOUNTER — Ambulatory Visit (HOSPITAL_BASED_OUTPATIENT_CLINIC_OR_DEPARTMENT_OTHER): Payer: Self-pay | Admitting: Pharmacist

## 2013-03-06 DIAGNOSIS — I82819 Embolism and thrombosis of superficial veins of unspecified lower extremities: Secondary | ICD-10-CM

## 2013-03-06 DIAGNOSIS — I2699 Other pulmonary embolism without acute cor pulmonale: Secondary | ICD-10-CM

## 2013-03-06 DIAGNOSIS — I82812 Embolism and thrombosis of superficial veins of left lower extremities: Secondary | ICD-10-CM

## 2013-03-06 LAB — PROTIME-INR
INR: 4.5 — ABNORMAL HIGH (ref 2.00–3.50)
Protime: 54 Seconds — ABNORMAL HIGH (ref 10.6–13.4)

## 2013-03-06 NOTE — Progress Notes (Signed)
INR above goal today. Pt believes he may have took an extra dose or 2 of coumadin.  Pt states that he has not been eating as much broccoli or cabbage this summer. He is eating more squash, cucumbers and tomatoes instead. Pt started Norvasc 5mg  daily for hypertension in June 2014. No interactions noted with coumadin. No problems to report regarding anticoagulation. No unusual bruising. No bleeding. No missed coumadin doses. No s/s of clotting noted. Hold Coumadin today (8/18). On 8/19, continue Coumadin 10mg  daily.  Recheck INR on 03/13/13; lab at 12:00pm, 12:15pm for coumadin clinic. Pt has responded well to this dose adjustment in the past with similar INR levels.  Will evaluate INR and adjust dose as necessary. May need to consider reducing coumadin dose to 9mg  daily if INR remains elevated.

## 2013-03-06 NOTE — Patient Instructions (Addendum)
Hold Coumadin today (8/18). On 8/19, continue Coumadin 10mg  daily.  Recheck INR on 03/13/13; lab at 12:00pm, 12:15pm for coumadin clinic.

## 2013-03-13 ENCOUNTER — Other Ambulatory Visit: Payer: Self-pay | Admitting: Lab

## 2013-03-13 ENCOUNTER — Ambulatory Visit (HOSPITAL_BASED_OUTPATIENT_CLINIC_OR_DEPARTMENT_OTHER): Payer: Self-pay | Admitting: Pharmacist

## 2013-03-13 DIAGNOSIS — I82812 Embolism and thrombosis of superficial veins of left lower extremities: Secondary | ICD-10-CM

## 2013-03-13 DIAGNOSIS — I2699 Other pulmonary embolism without acute cor pulmonale: Secondary | ICD-10-CM

## 2013-03-13 LAB — POCT INR: INR: 3.2

## 2013-03-13 NOTE — Progress Notes (Signed)
INR decreased. INR near goal today. Pt took coumadin as instructed. No problems to report regarding anticoagulation. No changes in medications. Pt may try to include more vitamin K in his diet (more cabbage, vegetables). He hasn't been eating his usual amounts this summer. Continue Coumadin 10mg  daily.  Will recheck INR in 6 weeks vs 8 weeks (his usual time frame). Recheck INR on 04/24/13; lab at 12:00pm, 12:15pm for coumadin clinic.

## 2013-03-13 NOTE — Patient Instructions (Signed)
Continue Coumadin 10mg  daily.  Recheck INR on 04/24/13; lab at 12:00pm, 12:15pm for coumadin clinic.

## 2013-04-24 ENCOUNTER — Ambulatory Visit (HOSPITAL_BASED_OUTPATIENT_CLINIC_OR_DEPARTMENT_OTHER): Payer: Self-pay | Admitting: Pharmacist

## 2013-04-24 ENCOUNTER — Other Ambulatory Visit (HOSPITAL_BASED_OUTPATIENT_CLINIC_OR_DEPARTMENT_OTHER): Payer: Self-pay | Admitting: Lab

## 2013-04-24 DIAGNOSIS — I2699 Other pulmonary embolism without acute cor pulmonale: Secondary | ICD-10-CM

## 2013-04-24 DIAGNOSIS — I82812 Embolism and thrombosis of superficial veins of left lower extremities: Secondary | ICD-10-CM

## 2013-04-24 LAB — PROTIME-INR: Protime: 18 Seconds — ABNORMAL HIGH (ref 10.6–13.4)

## 2013-04-24 LAB — POCT INR: INR: 1.5

## 2013-04-24 NOTE — Progress Notes (Signed)
INR = 1.5 on 10 mg daily Pt missed a dose on Friday & he had brussel sprouts last night for dinner. Pt has been stable for a long time on 10 mg daily. No change to dose. Repeat INR in 10 days. Ebony Hail, Pharm.D., CPP 04/24/2013@12 :36 PM

## 2013-05-04 ENCOUNTER — Ambulatory Visit: Payer: Self-pay

## 2013-05-04 ENCOUNTER — Other Ambulatory Visit: Payer: Self-pay

## 2013-05-08 ENCOUNTER — Ambulatory Visit (HOSPITAL_BASED_OUTPATIENT_CLINIC_OR_DEPARTMENT_OTHER): Payer: Self-pay | Admitting: Pharmacist

## 2013-05-08 ENCOUNTER — Other Ambulatory Visit (HOSPITAL_BASED_OUTPATIENT_CLINIC_OR_DEPARTMENT_OTHER): Payer: Self-pay | Admitting: Lab

## 2013-05-08 DIAGNOSIS — I2699 Other pulmonary embolism without acute cor pulmonale: Secondary | ICD-10-CM

## 2013-05-08 DIAGNOSIS — I82812 Embolism and thrombosis of superficial veins of left lower extremities: Secondary | ICD-10-CM

## 2013-05-08 LAB — PROTIME-INR: Protime: 27.6 Seconds — ABNORMAL HIGH (ref 10.6–13.4)

## 2013-05-08 NOTE — Progress Notes (Signed)
INR at goal No complications regarding anticoagulation Pt continues to be stable on now on 10 mg daily  Pt reports no missed doses or extra doses No changes to regimen Plan to continue 10 mg daily Recheck in 1 month on 06/12/13 at 12pm for lab and 12:15pm for coumadin clinic

## 2013-05-08 NOTE — Patient Instructions (Signed)
INR at goal No changes Continue Coumadin 10mg  daily.  Recheck INR on 06/12/13; lab at  12 pm and 12:15pm for coumadin clinic.

## 2013-05-24 ENCOUNTER — Ambulatory Visit (HOSPITAL_COMMUNITY)
Admission: RE | Admit: 2013-05-24 | Discharge: 2013-05-24 | Disposition: A | Discharge status: Home / Self Care | Payer: Self-pay | Source: Ambulatory Visit | Attending: Oncology | Admitting: Oncology

## 2013-05-24 ENCOUNTER — Ambulatory Visit (HOSPITAL_BASED_OUTPATIENT_CLINIC_OR_DEPARTMENT_OTHER): Payer: Self-pay | Admitting: Oncology

## 2013-05-24 ENCOUNTER — Other Ambulatory Visit: Payer: Self-pay | Admitting: Oncology

## 2013-05-24 ENCOUNTER — Ambulatory Visit (HOSPITAL_BASED_OUTPATIENT_CLINIC_OR_DEPARTMENT_OTHER): Payer: Self-pay

## 2013-05-24 VITALS — BP 155/105 | HR 78 | Temp 97.8°F | Resp 20 | Ht 76.0 in | Wt 261.5 lb

## 2013-05-24 DIAGNOSIS — I82409 Acute embolism and thrombosis of unspecified deep veins of unspecified lower extremity: Secondary | ICD-10-CM

## 2013-05-24 DIAGNOSIS — I803 Phlebitis and thrombophlebitis of lower extremities, unspecified: Secondary | ICD-10-CM | POA: Insufficient documentation

## 2013-05-24 DIAGNOSIS — I724 Aneurysm of artery of lower extremity: Secondary | ICD-10-CM | POA: Insufficient documentation

## 2013-05-24 DIAGNOSIS — I824Y9 Acute embolism and thrombosis of unspecified deep veins of unspecified proximal lower extremity: Secondary | ICD-10-CM | POA: Insufficient documentation

## 2013-05-24 DIAGNOSIS — M7989 Other specified soft tissue disorders: Secondary | ICD-10-CM

## 2013-05-24 DIAGNOSIS — M79609 Pain in unspecified limb: Secondary | ICD-10-CM | POA: Insufficient documentation

## 2013-05-24 DIAGNOSIS — Z86718 Personal history of other venous thrombosis and embolism: Secondary | ICD-10-CM | POA: Insufficient documentation

## 2013-05-24 DIAGNOSIS — I82401 Acute embolism and thrombosis of unspecified deep veins of right lower extremity: Secondary | ICD-10-CM

## 2013-05-24 DIAGNOSIS — Z7901 Long term (current) use of anticoagulants: Secondary | ICD-10-CM | POA: Insufficient documentation

## 2013-05-24 HISTORY — DX: Acute embolism and thrombosis of unspecified deep veins of unspecified proximal lower extremity: I82.4Y9

## 2013-05-24 LAB — D-DIMER, QUANTITATIVE: D-Dimer, Quant: 2.36 ug/mL-FEU — ABNORMAL HIGH (ref 0.00–0.48)

## 2013-05-24 LAB — PROTIME-INR: Protime: 19.2 Seconds — ABNORMAL HIGH (ref 10.6–13.4)

## 2013-05-24 MED ORDER — WARFARIN SODIUM 2 MG PO TABS: 2.0000 mg | ORAL_TABLET | Freq: Every day | ORAL | Status: DC

## 2013-05-24 MED ORDER — ENOXAPARIN SODIUM 300 MG/3ML IJ SOLN
180.0000 mg | Freq: Once | INTRAMUSCULAR | Status: AC
  Administered 2013-05-24: 180 mg via SUBCUTANEOUS
  Filled 2013-05-24: qty 1.8

## 2013-05-24 NOTE — Progress Notes (Signed)
Unscheduled visit for this 52 year old man on chronic Coumadin anticoagulation for recurrent idiopathic pulmonary emboli. He had pulmonary emboli in December 1997, January 1998, October 1998, and in December 1999. He had an episode of superficial phlebitis right lesser saphenous vein in July 2007 when Coumadin level was subtherapeutic. He had a second episode of extensive superficial thrombophlebitis of the left lower extremity in October of 2013. Coumadin temporarily discontinued at that time and he was put on therapeutic doses of heparin. He called our office late afternoon to report five-day history of deep pain in his right calf. He denies any dyspnea, chest pain, or palpitations. INR checked in the office on October 20 was 2.3 on his chronic Coumadin dose of 10 mg. He states he has been compliant and has not missed any doses. We had him come in for further evaluation. INR today is subtherapeutic at 1.6.  Exam: Blood pressure 155/105 pulse 78 which is actually fast for him, temp 97. 8, respirations 20 not labored. Lungs clear to auscultation resonant to percussion. Regular cardiac rhythm no murmur. Extremities minimal tenderness on palpation deep into the right posterior calf. Measurements 47 cm and  identical in both legs.   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 To my surprise, he has extensive acute deep venous thrombosis in the femoral and popliteal veins. In addition, a popliteal artery aneurysm is noted. There is clot in the artery as well but there is good flow.  I will advise him to stay on the Lovenox. I am increasing his Coumadin to 12 mg daily. We will check a protime/INR on Monday, November 10.

## 2013-05-24 NOTE — Progress Notes (Signed)
VASCULAR LAB PRELIMINARY  PRELIMINARY  PRELIMINARY  PRELIMINARY  Right lower extremity venous duplex completed.    Preliminary report:  Right:  DVT noted in the distal FV, popliteal v, PTV, and peroneal v.  No evidence of superficial thrombosis.  No Baker's cyst.  Incidental finding:  Partially thrombosed right popliteal artery aneurysm.     Seddrick Flax, RVT 05/24/2013, 7:03 PM

## 2013-05-25 ENCOUNTER — Telehealth: Payer: Self-pay | Admitting: *Deleted

## 2013-05-25 MED ORDER — ENOXAPARIN SODIUM 120 MG/0.8ML ~~LOC~~ SOLN: 120.0000 mg | Freq: Two times a day (BID) | SUBCUTANEOUS | Status: DC

## 2013-05-25 NOTE — Telephone Encounter (Signed)
Return call from [name] [name] to confirm he received the message with the dosing instructions of 120 mg bid. His pharmacy will not have this until some time tomorrow. Was able to obtain #4 syringes (samples) from our pharmacy and he will pick these up today to administer until his supply at [facility] arrives. Last dose of Lovenox was 180 mg at 5pm last night. Instructed him to give his Lovenox as soon as he gets it today. Important to stay on schedule.

## 2013-05-25 NOTE — Telephone Encounter (Signed)
Called patient and left message on cell phone.  Dr.Granfortuna had also left a message on his cell phone last night that he did indeed have a clot and to go back on his lovenox.  Patient said he had some left over when he was here to see Dr.Granfortuna.  Dr. Cyndie Chime had told patient to call if he needed more.   Patient's last lovenox was 120mg  BID.  We gave him a dose of 180mg  last night.  Pharmacy mixed our dose into one injection.  However, coming from a regular pharmacy will require 2 injections as largest dose it comes in is 150.  Therefore, Dr. Cyndie Chime said he may as well do the 120mg  BID.   Will escribe to [facility].  Left patient this information on his cell phone and did ask him to contact us to verify that he did receive this information.

## 2013-05-26 ENCOUNTER — Telehealth: Payer: Self-pay | Admitting: Oncology

## 2013-05-26 NOTE — Telephone Encounter (Signed)
lvm for pt for NOV.[name] KitchenMarland [name]

## 2013-05-29 ENCOUNTER — Encounter (INDEPENDENT_AMBULATORY_CARE_PROVIDER_SITE_OTHER): Payer: Self-pay

## 2013-05-29 ENCOUNTER — Other Ambulatory Visit (HOSPITAL_BASED_OUTPATIENT_CLINIC_OR_DEPARTMENT_OTHER): Payer: Self-pay | Admitting: Lab

## 2013-05-29 DIAGNOSIS — I82401 Acute embolism and thrombosis of unspecified deep veins of right lower extremity: Secondary | ICD-10-CM

## 2013-05-29 DIAGNOSIS — I824Y9 Acute embolism and thrombosis of unspecified deep veins of unspecified proximal lower extremity: Secondary | ICD-10-CM

## 2013-05-29 LAB — CBC WITH DIFFERENTIAL/PLATELET
Basophils Absolute: 0 10*3/uL (ref 0.0–0.1)
EOS%: 2.8 % (ref 0.0–7.0)
HCT: 48.9 % (ref 38.4–49.9)
HGB: 17.4 g/dL — ABNORMAL HIGH (ref 13.0–17.1)
LYMPH%: 28.2 % (ref 14.0–49.0)
MCH: 31.9 pg (ref 27.2–33.4)
MCV: 89.7 fL (ref 79.3–98.0)
MONO#: 0.9 10*3/uL (ref 0.1–0.9)
MONO%: 14.9 % — ABNORMAL HIGH (ref 0.0–14.0)
NEUT%: 53.8 % (ref 39.0–75.0)
Platelets: 173 10*3/uL (ref 140–400)
RDW: 12.9 % (ref 11.0–14.6)
WBC: 6 10*3/uL (ref 4.0–10.3)
nRBC: 0 % (ref 0–0)

## 2013-05-29 LAB — PROTIME-INR
INR: 2.4 (ref 2.00–3.50)
Protime: 28.8 s — ABNORMAL HIGH (ref 10.6–13.4)

## 2013-05-30 ENCOUNTER — Encounter: Payer: Self-pay | Admitting: Pharmacist

## 2013-05-30 NOTE — Progress Notes (Signed)
Per Dr. Cyndie Chime: New DVT Arterial and venous occurred when INR 1.6 with value just 10 days prior to INR of 2.5 on same dose & pt swears he has not missed doses.  INR now back to goal at 2.4 OK to stop lovenox but let's change target INR to 2.4-3.5 per Dr. Cyndie Chime. Pt has appointment with coumadin clinic 06/12/13

## 2013-06-12 ENCOUNTER — Other Ambulatory Visit (HOSPITAL_BASED_OUTPATIENT_CLINIC_OR_DEPARTMENT_OTHER): Payer: Self-pay | Admitting: Lab

## 2013-06-12 ENCOUNTER — Ambulatory Visit (HOSPITAL_BASED_OUTPATIENT_CLINIC_OR_DEPARTMENT_OTHER): Payer: Self-pay | Admitting: Pharmacist

## 2013-06-12 DIAGNOSIS — I82812 Embolism and thrombosis of superficial veins of left lower extremities: Secondary | ICD-10-CM

## 2013-06-12 DIAGNOSIS — I82819 Embolism and thrombosis of superficial veins of unspecified lower extremities: Secondary | ICD-10-CM

## 2013-06-12 DIAGNOSIS — I2699 Other pulmonary embolism without acute cor pulmonale: Secondary | ICD-10-CM

## 2013-06-12 LAB — PROTIME-INR

## 2013-06-12 LAB — PROTHROMBIN TIME
INR: 5.07 (ref <1.50)
Prothrombin Time: 44.9 seconds — ABNORMAL HIGH (ref 11.6–15.2)

## 2013-06-12 LAB — POCT INR: INR: 5.07

## 2013-06-12 NOTE — Progress Notes (Signed)
INR above goal of 2.5-3.5.  No bleeding or bruising noted.  [name] [name] has been taking Coumadin 12mg  daily.  Per discussion with Dr. Cyndie Chime, will hold coumadin today and tomorrow and check PT/INR on 11/26.  Would resume coumadin back at 10mg  daily when appropriate to restart.  Coumadin 10mg  x 29 samples given to pt. Lot [id number] Exp 10/2013

## 2013-06-14 ENCOUNTER — Other Ambulatory Visit (HOSPITAL_BASED_OUTPATIENT_CLINIC_OR_DEPARTMENT_OTHER): Payer: Self-pay | Admitting: Lab

## 2013-06-14 ENCOUNTER — Ambulatory Visit (HOSPITAL_BASED_OUTPATIENT_CLINIC_OR_DEPARTMENT_OTHER): Payer: Self-pay | Admitting: Pharmacist

## 2013-06-14 DIAGNOSIS — I82812 Embolism and thrombosis of superficial veins of left lower extremities: Secondary | ICD-10-CM

## 2013-06-14 DIAGNOSIS — I2699 Other pulmonary embolism without acute cor pulmonale: Secondary | ICD-10-CM

## 2013-06-14 LAB — PROTIME-INR: Protime: 21.6 Seconds — ABNORMAL HIGH (ref 10.6–13.4)

## 2013-06-14 NOTE — Progress Notes (Signed)
Pt seen in clinic today INR=1.8 after holding dose for two days as instructed. Will restart at 10mg  daily.   No other changes to report. Recheck INR on 06/23/13 with lab at 12:00 and coumadin clinic at 10:15.

## 2013-06-14 NOTE — Patient Instructions (Signed)
Restart coumadin at 10 mg daily.  Recheck INR on 06/23/13 with lab at 12:00 and coumadin clinic at 10:15.

## 2013-06-23 ENCOUNTER — Telehealth: Payer: Self-pay | Admitting: Pharmacist

## 2013-06-23 ENCOUNTER — Other Ambulatory Visit: Payer: Self-pay

## 2013-06-23 ENCOUNTER — Ambulatory Visit: Payer: Self-pay

## 2013-06-23 NOTE — Telephone Encounter (Signed)
FTKA in coumadin clinic.  I left VM to reschedule.

## 2013-06-27 ENCOUNTER — Ambulatory Visit: Payer: Self-pay | Admitting: Lab

## 2013-06-27 ENCOUNTER — Telehealth: Payer: Self-pay | Admitting: Pharmacist

## 2013-06-27 ENCOUNTER — Ambulatory Visit: Payer: Self-pay

## 2013-06-27 NOTE — Telephone Encounter (Signed)
Pt FTKA today for coumadin clinic at lab. Called this afternoon and Left VM for pt to call back to reschedule  Christell Faith, PharmD

## 2013-07-10 ENCOUNTER — Telehealth: Payer: Self-pay | Admitting: Pharmacist

## 2013-07-18 ENCOUNTER — Encounter (INDEPENDENT_AMBULATORY_CARE_PROVIDER_SITE_OTHER): Payer: Self-pay

## 2013-07-18 ENCOUNTER — Ambulatory Visit (HOSPITAL_BASED_OUTPATIENT_CLINIC_OR_DEPARTMENT_OTHER): Payer: Self-pay | Admitting: Pharmacist

## 2013-07-18 ENCOUNTER — Other Ambulatory Visit (HOSPITAL_BASED_OUTPATIENT_CLINIC_OR_DEPARTMENT_OTHER): Payer: Self-pay

## 2013-07-18 DIAGNOSIS — I2699 Other pulmonary embolism without acute cor pulmonale: Secondary | ICD-10-CM

## 2013-07-18 DIAGNOSIS — I82812 Embolism and thrombosis of superficial veins of left lower extremities: Secondary | ICD-10-CM

## 2013-07-18 LAB — PROTIME-INR: INR: 4.3 — ABNORMAL HIGH (ref 2.00–3.50)

## 2013-07-18 LAB — POCT INR: INR: 4.3

## 2013-07-18 NOTE — Progress Notes (Signed)
INR = 4.3 on Coumadin 10 mg daily He had a "few" beers yesterday while watching ballgames on TV w/ his brother. No bleeding/bruising. INR high today. He plans to drink more over New Year's Eve.  Not sure how much he says. Hold Coumadin x 2 days then back to 10 mg daily. Return in 2 weeks. Ebony Hail, Pharm.D., CPP 07/18/2013@3 :15 PM

## 2013-07-31 ENCOUNTER — Encounter (INDEPENDENT_AMBULATORY_CARE_PROVIDER_SITE_OTHER): Payer: Self-pay

## 2013-07-31 ENCOUNTER — Ambulatory Visit (HOSPITAL_BASED_OUTPATIENT_CLINIC_OR_DEPARTMENT_OTHER): Payer: Self-pay | Admitting: Pharmacist

## 2013-07-31 ENCOUNTER — Other Ambulatory Visit (HOSPITAL_BASED_OUTPATIENT_CLINIC_OR_DEPARTMENT_OTHER): Payer: Self-pay

## 2013-07-31 DIAGNOSIS — I2699 Other pulmonary embolism without acute cor pulmonale: Secondary | ICD-10-CM

## 2013-07-31 DIAGNOSIS — I82812 Embolism and thrombosis of superficial veins of left lower extremities: Secondary | ICD-10-CM

## 2013-07-31 LAB — PROTIME-INR
INR: 2.5 (ref 2.00–3.50)
Protime: 30 Seconds — ABNORMAL HIGH (ref 10.6–13.4)

## 2013-07-31 LAB — POCT INR: INR: 2.5

## 2013-07-31 NOTE — Progress Notes (Signed)
INR = 2.5   Goal 2.5-3.5 INR within goal range. No complications of anticoagulation noted. No new medications or dietary changes. Patient states he had a "2 beers" during the ballgame yesterday, but has not been drinking more than that. He states that he usually takes his Coumadin as scheduled but may forget a dose or double up occasionally by accident. He has been compliant over the last 1-2 weeks. He would prefer to wait 6 weeks for his next appointment.  I told him this may be possible when he is stable for a while, but he will need to be seen more frequently at this time. He will continue Coumadin 10 mg daily and return in 4 weeks on 08/28/13 at 12 noon for lab and 12:15 for Coumadin clinic.  Theone Murdoch, PharmD

## 2013-08-28 ENCOUNTER — Other Ambulatory Visit (HOSPITAL_BASED_OUTPATIENT_CLINIC_OR_DEPARTMENT_OTHER): Payer: Self-pay

## 2013-08-28 ENCOUNTER — Ambulatory Visit (HOSPITAL_BASED_OUTPATIENT_CLINIC_OR_DEPARTMENT_OTHER): Payer: Self-pay | Admitting: Pharmacist

## 2013-08-28 DIAGNOSIS — I2699 Other pulmonary embolism without acute cor pulmonale: Secondary | ICD-10-CM

## 2013-08-28 DIAGNOSIS — I82812 Embolism and thrombosis of superficial veins of left lower extremities: Secondary | ICD-10-CM

## 2013-08-28 LAB — PROTIME-INR
INR: 2.1 (ref 2.00–3.50)
PROTIME: 25.2 s — AB (ref 10.6–13.4)

## 2013-08-28 LAB — POCT INR: INR: 2.1

## 2013-08-28 NOTE — Progress Notes (Signed)
INR just below goal today but > 2 (at 2.1) Pt is doing well with no complaints No issues regarding unusual bleeding or bruising No S/S of clotting Pt reports no recent missed or extra doses He has not had much alcohol recently but states he did have "a beer or 2" on Friday night Other than this pt reports no diet or medication changes Since pt is slightly below goal will boost slightly today Plan: Take 15 mg tonight Continue 10 mg daily Recheck INR on 10/02/13 with lab at 12:00 pm and coumadin clinic at 12:15 pm If INR continues to be below 2.5 may consider increasing dose (but this is difficult as pt can fluctuate to > 3.5 depending on alcohol intake, and this has been discussed with him on multiple occassions)

## 2013-08-28 NOTE — Patient Instructions (Signed)
INR just below goal today but > 2. Take 15 mg tonight Continue 10 mg daily Recheck INR on 10/02/13 with lab at 12:00 pm and coumadin clinic at 12:15 pm

## 2013-09-18 ENCOUNTER — Encounter: Payer: Self-pay | Admitting: Oncology

## 2013-10-02 ENCOUNTER — Other Ambulatory Visit (HOSPITAL_BASED_OUTPATIENT_CLINIC_OR_DEPARTMENT_OTHER): Payer: Self-pay

## 2013-10-02 ENCOUNTER — Other Ambulatory Visit: Payer: Self-pay | Admitting: Pharmacist

## 2013-10-02 ENCOUNTER — Ambulatory Visit (HOSPITAL_BASED_OUTPATIENT_CLINIC_OR_DEPARTMENT_OTHER): Payer: Self-pay | Admitting: Pharmacist

## 2013-10-02 DIAGNOSIS — I2699 Other pulmonary embolism without acute cor pulmonale: Secondary | ICD-10-CM

## 2013-10-02 DIAGNOSIS — I82812 Embolism and thrombosis of superficial veins of left lower extremities: Secondary | ICD-10-CM

## 2013-10-02 LAB — PROTIME-INR
INR: 2.9 (ref 2.00–3.50)
PROTIME: 34.8 s — AB (ref 10.6–13.4)

## 2013-10-02 LAB — POCT INR: INR: 2.9

## 2013-10-02 NOTE — Progress Notes (Signed)
Pt seen in clinic today INR=2.9 on 10mg  daily Pt has no changes to report No new meds Consistent diet Informed him, with copy of letter, how to schedule his annual appmt with Dr. Darnell Level at [facility] Continue same dose of 10 mg daily. RTC on 11/13/13 at noon for lab and 12:15 for CC

## 2013-10-02 NOTE — Patient Instructions (Signed)
Continue 10 mg daily Recheck INR on 11/13/13 with lab at 12:00 pm and coumadin clinic at 12:15 pm

## 2013-11-06 ENCOUNTER — Other Ambulatory Visit: Payer: Self-pay | Admitting: Oncology

## 2013-11-07 ENCOUNTER — Other Ambulatory Visit: Payer: Self-pay | Admitting: Oncology

## 2013-11-13 ENCOUNTER — Ambulatory Visit (HOSPITAL_BASED_OUTPATIENT_CLINIC_OR_DEPARTMENT_OTHER): Payer: Self-pay | Admitting: Pharmacist

## 2013-11-13 ENCOUNTER — Ambulatory Visit (HOSPITAL_BASED_OUTPATIENT_CLINIC_OR_DEPARTMENT_OTHER): Payer: Self-pay

## 2013-11-13 DIAGNOSIS — I2699 Other pulmonary embolism without acute cor pulmonale: Secondary | ICD-10-CM

## 2013-11-13 LAB — PROTIME-INR
INR: 4 — AB (ref 2.00–3.50)
Protime: 48 Seconds — ABNORMAL HIGH (ref 10.6–13.4)

## 2013-11-13 LAB — POCT INR: INR: 4

## 2013-11-13 NOTE — Patient Instructions (Signed)
Hold dose on Tue then continue 10 mg daily Recheck INR on 12/18/13 with lab at 12:00 pm and coumadin clinic at 12:15 pm

## 2013-11-13 NOTE — Progress Notes (Signed)
Patient seen in coumadin clinic today INR=4 and he has already taken todays dose of 10mg  Will hold tomorrow and restart on his usual dose of 10mg  daily His goal is 2.5-3.5 He states he should have his annual f/u in early June with Dr. Beryle Beams He will call and schedule that appmt with him. Instructed patient to let us know if he wants all his labs done here for that annual appmt with Dr. Darnell Level He will also inform Dr. Darnell Level office of his next appmt here on June 1 and to send orders for his labs No changes to report RTC on December 18, 2013

## 2013-12-18 ENCOUNTER — Other Ambulatory Visit: Payer: Self-pay

## 2013-12-18 ENCOUNTER — Ambulatory Visit: Payer: Self-pay

## 2013-12-18 ENCOUNTER — Telehealth: Payer: Self-pay | Admitting: Pharmacist

## 2013-12-25 ENCOUNTER — Other Ambulatory Visit: Payer: Self-pay

## 2013-12-28 ENCOUNTER — Telehealth: Payer: Self-pay | Admitting: Pharmacist

## 2014-01-01 ENCOUNTER — Ambulatory Visit: Payer: Self-pay | Admitting: Oncology

## 2014-01-08 ENCOUNTER — Other Ambulatory Visit (HOSPITAL_BASED_OUTPATIENT_CLINIC_OR_DEPARTMENT_OTHER): Payer: Self-pay

## 2014-01-08 ENCOUNTER — Ambulatory Visit (HOSPITAL_BASED_OUTPATIENT_CLINIC_OR_DEPARTMENT_OTHER): Payer: Self-pay | Admitting: Pharmacist

## 2014-01-08 DIAGNOSIS — I2699 Other pulmonary embolism without acute cor pulmonale: Secondary | ICD-10-CM

## 2014-01-08 DIAGNOSIS — I10 Essential (primary) hypertension: Secondary | ICD-10-CM

## 2014-01-08 DIAGNOSIS — Z7901 Long term (current) use of anticoagulants: Secondary | ICD-10-CM

## 2014-01-08 DIAGNOSIS — E782 Mixed hyperlipidemia: Secondary | ICD-10-CM

## 2014-01-08 LAB — COMPREHENSIVE METABOLIC PANEL (CC13)
ALBUMIN: 3.7 g/dL (ref 3.5–5.0)
ALT: 83 U/L — ABNORMAL HIGH (ref 0–55)
ANION GAP: 9 meq/L (ref 3–11)
AST: 50 U/L — ABNORMAL HIGH (ref 5–34)
Alkaline Phosphatase: 87 U/L (ref 40–150)
BILIRUBIN TOTAL: 0.67 mg/dL (ref 0.20–1.20)
BUN: 12.3 mg/dL (ref 7.0–26.0)
CALCIUM: 9.3 mg/dL (ref 8.4–10.4)
CHLORIDE: 107 meq/L (ref 98–109)
CO2: 23 meq/L (ref 22–29)
Creatinine: 0.9 mg/dL (ref 0.7–1.3)
Glucose: 107 mg/dl (ref 70–140)
Potassium: 4.5 mEq/L (ref 3.5–5.1)
SODIUM: 139 meq/L (ref 136–145)
TOTAL PROTEIN: 7.1 g/dL (ref 6.4–8.3)

## 2014-01-08 LAB — CBC WITH DIFFERENTIAL/PLATELET
BASO%: 0.2 % (ref 0.0–2.0)
BASOS ABS: 0 10*3/uL (ref 0.0–0.1)
EOS%: 4.1 % (ref 0.0–7.0)
Eosinophils Absolute: 0.2 10*3/uL (ref 0.0–0.5)
HEMATOCRIT: 50 % — AB (ref 38.4–49.9)
HGB: 17.3 g/dL — ABNORMAL HIGH (ref 13.0–17.1)
LYMPH%: 31.7 % (ref 14.0–49.0)
MCH: 31.9 pg (ref 27.2–33.4)
MCHC: 34.6 g/dL (ref 32.0–36.0)
MCV: 92.3 fL (ref 79.3–98.0)
MONO#: 0.6 10*3/uL (ref 0.1–0.9)
MONO%: 13.4 % (ref 0.0–14.0)
NEUT#: 2.2 10*3/uL (ref 1.5–6.5)
NEUT%: 50.6 % (ref 39.0–75.0)
Platelets: 155 10*3/uL (ref 140–400)
RBC: 5.42 10*6/uL (ref 4.20–5.82)
RDW: 13.8 % (ref 11.0–14.6)
WBC: 4.4 10*3/uL (ref 4.0–10.3)
lymph#: 1.4 10*3/uL (ref 0.9–3.3)

## 2014-01-08 LAB — LIPID PANEL
Cholesterol: 210 mg/dL — ABNORMAL HIGH (ref 0–200)
HDL: 35 mg/dL — ABNORMAL LOW (ref >39)
LDL Cholesterol: 102 mg/dL — ABNORMAL HIGH (ref 0–99)
Total CHOL/HDL Ratio: 6 Ratio
Triglycerides: 363 mg/dL — ABNORMAL HIGH (ref <150)
VLDL: 73 mg/dL — ABNORMAL HIGH (ref 0–40)

## 2014-01-08 LAB — PROTIME-INR
INR: 1.7 — AB (ref 2.00–3.50)
Protime: 20.4 Seconds — ABNORMAL HIGH (ref 10.6–13.4)

## 2014-01-08 LAB — POCT INR: INR: 1.7

## 2014-01-08 NOTE — Progress Notes (Signed)
INR = 1.7 Pt has been taking 10 mg Coumadin daily. He may have missed a dose or two, he says. No s/sxs of VTE. He has cut back on his smoking a little but isn't ready to stop completely. INR low.  I d/w Dr. Beryle Beams & no need for bridge w/ LMWH.  Pt will take Coumadin 15 mg today then resume 10 mg daily. Repeat protime in 3 weeks. [name], Pharm.D., CPP 01/08/2014@12 :38 PM

## 2014-01-29 ENCOUNTER — Other Ambulatory Visit: Payer: Self-pay

## 2014-01-29 ENCOUNTER — Ambulatory Visit: Payer: Self-pay

## 2014-01-29 ENCOUNTER — Telehealth: Payer: Self-pay | Admitting: Pharmacist

## 2014-01-29 NOTE — Telephone Encounter (Signed)
Pt FTKA today for lab and coumadin clinic. I called and spoke to patient who was unable to make appointment this morning. Rescheduled for next Monday, 02/05/14 at 12:30pm for lab and 12:45pm for coumadin clinic  Message sent to scheduling  Thank you!  Montel Clock, PharmD, BCOP

## 2014-02-03 ENCOUNTER — Other Ambulatory Visit: Payer: Self-pay | Admitting: Oncology

## 2014-02-05 ENCOUNTER — Other Ambulatory Visit: Payer: Self-pay

## 2014-02-05 ENCOUNTER — Ambulatory Visit: Payer: Self-pay

## 2014-02-06 ENCOUNTER — Telehealth: Payer: Self-pay | Admitting: Pharmacist

## 2014-02-06 NOTE — Telephone Encounter (Signed)
Asked pt to call and R/S lab and CC appointments that pt cancelled on 02/05/14. Please call [phone] to reschedule. Thanks

## 2014-02-09 ENCOUNTER — Telehealth: Payer: Self-pay | Admitting: Pharmacist

## 2014-02-09 NOTE — Telephone Encounter (Signed)
LVM to call us and r/s missed cc appmt

## 2014-02-16 ENCOUNTER — Telehealth: Payer: Self-pay | Admitting: Oncology

## 2014-02-16 NOTE — Telephone Encounter (Signed)
Pt lft msg wanting to r/s labs/coumadin clinic, lft pt msg advising r/s for 08/03 per pt's req.Marland KitchenMarland KitchenKJ

## 2014-02-19 ENCOUNTER — Ambulatory Visit (HOSPITAL_BASED_OUTPATIENT_CLINIC_OR_DEPARTMENT_OTHER): Payer: Self-pay | Admitting: Pharmacist

## 2014-02-19 ENCOUNTER — Other Ambulatory Visit (HOSPITAL_BASED_OUTPATIENT_CLINIC_OR_DEPARTMENT_OTHER): Payer: Self-pay

## 2014-02-19 DIAGNOSIS — I2699 Other pulmonary embolism without acute cor pulmonale: Secondary | ICD-10-CM

## 2014-02-19 LAB — PROTIME-INR
INR: 3.6 — ABNORMAL HIGH (ref 2.00–3.50)
Protime: 43.2 Seconds — ABNORMAL HIGH (ref 10.6–13.4)

## 2014-02-19 LAB — POCT INR: INR: 3.6

## 2014-02-19 NOTE — Progress Notes (Signed)
INR right at goal today Pt is doing well with no compaints No unusual bleeding or bruising No missed or extra doses No medication or diet changes Pt has follow up with Dr. Beryle Beams in ~ 2 weeks Plan: No changes  Continue 10 mg daily of coumadin  Recheck INR on 04/02/14 with lab at 12:30pm and coumadin clinic at 12:45pm

## 2014-02-19 NOTE — Patient Instructions (Signed)
INR right at goal No changes  Continue 10 mg daily of coumadin  Recheck INR on 04/02/14 with lab at 12:30pm and coumadin clinic at 12:45pm

## 2014-02-26 NOTE — Telephone Encounter (Signed)
Phone call - encounter closed. 

## 2014-03-06 ENCOUNTER — Encounter: Payer: Self-pay | Admitting: Oncology

## 2014-03-15 NOTE — Telephone Encounter (Signed)
Encounter was telephone call. 

## 2014-04-02 ENCOUNTER — Ambulatory Visit (HOSPITAL_BASED_OUTPATIENT_CLINIC_OR_DEPARTMENT_OTHER): Payer: Self-pay | Admitting: Pharmacist

## 2014-04-02 ENCOUNTER — Other Ambulatory Visit: Payer: Self-pay | Admitting: Pharmacist

## 2014-04-02 ENCOUNTER — Other Ambulatory Visit (HOSPITAL_BASED_OUTPATIENT_CLINIC_OR_DEPARTMENT_OTHER): Payer: Self-pay

## 2014-04-02 ENCOUNTER — Ambulatory Visit (INDEPENDENT_AMBULATORY_CARE_PROVIDER_SITE_OTHER): Payer: Self-pay | Admitting: Oncology

## 2014-04-02 ENCOUNTER — Encounter: Payer: Self-pay | Admitting: Oncology

## 2014-04-02 VITALS — BP 141/100 | HR 70 | Temp 98.0°F | Ht 75.0 in | Wt 270.9 lb

## 2014-04-02 DIAGNOSIS — I2699 Other pulmonary embolism without acute cor pulmonale: Secondary | ICD-10-CM

## 2014-04-02 DIAGNOSIS — I498 Other specified cardiac arrhythmias: Secondary | ICD-10-CM

## 2014-04-02 DIAGNOSIS — F17200 Nicotine dependence, unspecified, uncomplicated: Secondary | ICD-10-CM

## 2014-04-02 DIAGNOSIS — D689 Coagulation defect, unspecified: Secondary | ICD-10-CM

## 2014-04-02 DIAGNOSIS — Z86711 Personal history of pulmonary embolism: Secondary | ICD-10-CM

## 2014-04-02 DIAGNOSIS — E785 Hyperlipidemia, unspecified: Secondary | ICD-10-CM

## 2014-04-02 DIAGNOSIS — D751 Secondary polycythemia: Secondary | ICD-10-CM

## 2014-04-02 DIAGNOSIS — I82812 Embolism and thrombosis of superficial veins of left lower extremities: Secondary | ICD-10-CM

## 2014-04-02 DIAGNOSIS — Z86718 Personal history of other venous thrombosis and embolism: Secondary | ICD-10-CM

## 2014-04-02 DIAGNOSIS — I10 Essential (primary) hypertension: Secondary | ICD-10-CM

## 2014-04-02 DIAGNOSIS — R001 Bradycardia, unspecified: Secondary | ICD-10-CM

## 2014-04-02 DIAGNOSIS — D2360 Other benign neoplasm of skin of unspecified upper limb, including shoulder: Secondary | ICD-10-CM

## 2014-04-02 DIAGNOSIS — I824Y9 Acute embolism and thrombosis of unspecified deep veins of unspecified proximal lower extremity: Secondary | ICD-10-CM

## 2014-04-02 DIAGNOSIS — Z7901 Long term (current) use of anticoagulants: Secondary | ICD-10-CM

## 2014-04-02 LAB — PROTIME-INR
INR: 2.3 (ref 2.00–3.50)
PROTIME: 27.6 s — AB (ref 10.6–13.4)

## 2014-04-02 LAB — POCT INR: INR: 2.3

## 2014-04-02 MED ORDER — TRIAMTERENE-HCTZ 37.5-25 MG PO CAPS: 1.0000 | ORAL_CAPSULE | Freq: Every day | ORAL | Status: DC

## 2014-04-02 NOTE — Patient Instructions (Addendum)
Start new blood pressure med:  Triamterene/Hydrochlrothiazide one daily Continue amlodipine Go back on omega-3 fish oil capsules [facility] to see if you will need a potassium supplement Visit with Dr Darnell Level  In 6 months  October 01, 2014 @ 9:45 AM  bloodwork at [place] 1 week before MD visit

## 2014-04-02 NOTE — Progress Notes (Signed)
Dr Beryle Beams pt.  INR slightly below goal today.  Mr Malecha saw Dr Beryle Beams earlier today and was prescribed Dyazide.  No other med changes.  No bleeding/bruising.  Mr Bellanca has been taking coumadin 10mg  daily since started in coumadin clinic in 2002.  Will continue coumadin 10mg  daily and check PT/INR in 6 weeks.  Will check BMET at that time since starting diuretic.

## 2014-04-04 NOTE — Progress Notes (Signed)
Patient ID: [name] [name], male   DOB: [birth year], 53 y.o.   MRN: [id number] Hematology and Oncology Follow Up Visit  [name] [name] [id number] 02/11/1961 53 y.o. 04/04/2014 4:19 PM   Principle Diagnosis: Encounter Diagnoses  Name Primary?  . Pulmonary embolism   . Polycythemia secondary to smoking   . Sinus bradycardia, chronic   . Superficial thrombosis of lower extremity, left   . Chronic anticoagulation   . DVT, lower extremity, proximal   . Benign essential HTN Yes     Interim History:   Follow up visit for this 53 year old man on chronic Coumadin anticoagulation for recurrent idiopathic pulmonary emboli. He had pulmonary emboli in December 1997, January 1998, October 1998, and in December 1999. He had an episode of superficial phlebitis right lesser saphenous vein in July 2007 when Coumadin level was subtherapeutic. He had a second episode of extensive superficial thrombophlebitis of the left lower extremity in October of 2013. Coumadin temporarily discontinued at that time and he was put on therapeutic doses of heparin. He developed a deep venous thrombosis in the right femoral and popliteal veins on 05/24/2013. INR was subtherapeutic at 1.6. He was started on Lovenox and his Coumadin dose was increased to 12 mg which is his current dose. INR back in therapeutic range and was 2.3 on September 14 with a range of 23.6 on the other recent values. He continues to smoke but has cut back to about 5 cigarettes daily. He has persistent polycythemia which I believe is secondary to his cigarette smoking. I do not find a record of a JAK-2 gene analysis study.  He has chronic, asymptomatic, bradycardia. He denies any ischemic type chest pain, dyspnea, or palpitations.    Medications: reviewed  Allergies: No Known Allergies  Review of Systems: Hematology: No bleeding or bruising  ENT ROS: No sore throat  Breast ROS:  Respiratory ROS: See above Cardiovascular ROS: See above   Gastrointestinal ROS: No abdominal pain or change in bowel habit   Genito-Urinary ROS: No urinary tract symptoms Musculoskeletal ROS: Intermittent right shoulder pain Neurological ROS: No headache or change in vision Dermatological ROS: No rash Remaining ROS negative:   Physical Exam: Blood pressure 141/100, pulse 70, temperature 98 F (36.7 C), height 6\' 3"  (1.905 m), weight 270 lb 14.4 oz (122.879 kg), SpO2 97.00%. Wt Readings from Last 3 Encounters:  04/02/14 270 lb 14.4 oz (122.879 kg)  05/24/13 261 lb 8 oz (118.616 kg)  01/02/13 255 lb 14.4 oz (116.075 kg)     General appearance: Tall, well-nourished, Caucasian man with a ruddy complexion HENNT: Pharynx no erythema, exudate, mass, or ulcer. No thyromegaly or thyroid nodules Lymph nodes: No cervical, supraclavicular, or axillary lymphadenopathy Breasts:  Lungs: Clear to auscultation, resonant to percussion throughout Heart: Regular rhythm, no murmur, no gallop, no rub, no click, no edema Abdomen: Soft, nontender, normal bowel sounds, no mass, no organomegaly Extremities: No edema, no calf tenderness Musculoskeletal: no joint deformities GU:  Vascular: Carotid pulses 2+, no bruits,  Neurologic: Alert, oriented, PERRLA, optic discs sharp and vessels normal, no hemorrhage or exudate, cranial nerves grossly normal, motor strength 5 over 5, reflexes 1+ symmetric, upper body coordination normal, gait normal, Skin: No rash or ecchymosis  Lab Results: CBC W/Diff    Component Value Date/Time   WBC 4.4 01/08/2014 1201   WBC 5.2 09/20/2009 1205   RBC 5.42 01/08/2014 1201   RBC 5.59 09/20/2009 1205   HGB 17.3* 01/08/2014 1201   HGB 17.8* 09/20/2009 1205  HCT 50.0* 01/08/2014 1201   HCT 52.5* 09/20/2009 1205   PLT 155 01/08/2014 1201   PLT 162 09/20/2009 1205   MCV 92.3 01/08/2014 1201   MCV 94 09/20/2009 1205   MCH 31.9 01/08/2014 1201   MCH 31.9 09/20/2009 1205   MCHC 34.6 01/08/2014 1201   MCHC 34.0 09/20/2009 1205   RDW 13.8 01/08/2014 1201    RDW 12.6 09/20/2009 1205   LYMPHSABS 1.4 01/08/2014 1201   LYMPHSABS 1.5 03/22/2009 1236   MONOABS 0.6 01/08/2014 1201   EOSABS 0.2 01/08/2014 1201   EOSABS 0.1 03/22/2009 1236   BASOSABS 0.0 01/08/2014 1201   BASOSABS 0.2 03/22/2009 1236     Chemistry      Component Value Date/Time   NA 139 01/08/2014 1203   NA 138 01/04/2012 1101   NA 139 07/30/2008 1121   K 4.5 01/08/2014 1203   K 4.2 01/04/2012 1101   K 4.5 07/30/2008 1121   CL 108* 12/28/2012 1405   CL 106 01/04/2012 1101   CL 106 07/30/2008 1121   CO2 23 01/08/2014 1203   CO2 25 01/04/2012 1101   CO2 27 07/30/2008 1121   BUN 12.3 01/08/2014 1203   BUN 16 01/04/2012 1101   BUN 13 07/30/2008 1121   CREATININE 0.9 01/08/2014 1203   CREATININE 1.09 01/04/2012 1101   CREATININE 1.0 07/30/2008 1121      Component Value Date/Time   CALCIUM 9.3 01/08/2014 1203   CALCIUM 8.9 01/04/2012 1101   CALCIUM 9.0 07/30/2008 1121   ALKPHOS 87 01/08/2014 1203   ALKPHOS 76 01/04/2012 1101   ALKPHOS 59 07/30/2008 1121   AST 50* 01/08/2014 1203   AST 46* 01/04/2012 1101   AST 23 07/30/2008 1121   ALT 83* 01/08/2014 1203   ALT 50 01/04/2012 1101   ALT 22 07/30/2008 1121   BILITOT 0.67 01/08/2014 1203   BILITOT 0.8 01/04/2012 1101   BILITOT 0.60 07/30/2008 1121       Radiological Studies: No results found.  Impression:   #1. Idiopathic coagulopathy on chronic Coumadin anticoagulation currently on Coumadin 12 mg daily #2. History of recurrent DVT and pulmonary emboli secondary to #1  Recurrent events every time he lets his Coumadin become subtherapeutic. #3. History of superficial thrombophlebitis  October 2013  #4. Chronic asymptomatic sinus bradycardia  #5. History of transient atrial fibrillation occurring 2 years ago no recurrence.  #6. Essential hypertension.  Blood pressure remains borderline elevated on single agent Norvasc. I am going to add Maxide 37.5/50. Return in one month to check potassium level. #7. Smoker's polycythemia  He continues to smoke. We  discussed smoking cessation again today. #8. Dermatofibroma left forearm  #9.  hyperlipidemia with prominent hypertriglyceridemia currently not on medication. He is encouraged to go back on omega-3 fish capsules. #10. Health maintenance he still has not had a baseline colonoscopy.   CC: Patient has no care team.   Annia Belt, MD 9/16/20154:19 PM

## 2014-04-28 ENCOUNTER — Other Ambulatory Visit: Payer: Self-pay | Admitting: Oncology

## 2014-05-06 ENCOUNTER — Other Ambulatory Visit: Payer: Self-pay | Admitting: Oncology

## 2014-05-07 ENCOUNTER — Other Ambulatory Visit: Payer: Self-pay | Admitting: Pharmacist

## 2014-05-07 DIAGNOSIS — I2699 Other pulmonary embolism without acute cor pulmonale: Secondary | ICD-10-CM

## 2014-05-07 MED ORDER — WARFARIN SODIUM 10 MG PO TABS: 10.0000 mg | ORAL_TABLET | Freq: Every day | ORAL | Status: DC

## 2014-05-14 ENCOUNTER — Other Ambulatory Visit: Payer: Self-pay

## 2014-05-14 ENCOUNTER — Telehealth: Payer: Self-pay | Admitting: Pharmacist

## 2014-05-14 ENCOUNTER — Ambulatory Visit: Payer: Self-pay

## 2014-05-14 DIAGNOSIS — I10 Essential (primary) hypertension: Secondary | ICD-10-CM

## 2014-05-14 NOTE — Telephone Encounter (Signed)
Pt unable to come to appt today.  Pt called & s/w scheduler but only to cancel today's appt. Taking Dyazide.  Will need CMET when comes next week. Pt requested appt for lab/CC on 05/21/14. [name], Pharm.D., CPP 05/14/2014@3 :22 PM

## 2014-05-21 ENCOUNTER — Ambulatory Visit (HOSPITAL_BASED_OUTPATIENT_CLINIC_OR_DEPARTMENT_OTHER): Payer: Self-pay | Admitting: Pharmacist

## 2014-05-21 ENCOUNTER — Other Ambulatory Visit (HOSPITAL_BASED_OUTPATIENT_CLINIC_OR_DEPARTMENT_OTHER): Payer: Self-pay

## 2014-05-21 DIAGNOSIS — I2699 Other pulmonary embolism without acute cor pulmonale: Secondary | ICD-10-CM

## 2014-05-21 DIAGNOSIS — I10 Essential (primary) hypertension: Secondary | ICD-10-CM

## 2014-05-21 DIAGNOSIS — D751 Secondary polycythemia: Secondary | ICD-10-CM

## 2014-05-21 LAB — CBC WITH DIFFERENTIAL/PLATELET
BASO%: 0.4 % (ref 0.0–2.0)
BASOS ABS: 0 10*3/uL (ref 0.0–0.1)
EOS ABS: 0.2 10*3/uL (ref 0.0–0.5)
EOS%: 2.9 % (ref 0.0–7.0)
HCT: 50.9 % — ABNORMAL HIGH (ref 38.4–49.9)
HEMOGLOBIN: 17.8 g/dL — AB (ref 13.0–17.1)
LYMPH#: 1.8 10*3/uL (ref 0.9–3.3)
LYMPH%: 34.7 % (ref 14.0–49.0)
MCH: 31.8 pg (ref 27.2–33.4)
MCHC: 35 g/dL (ref 32.0–36.0)
MCV: 90.9 fL (ref 79.3–98.0)
MONO#: 0.6 10*3/uL (ref 0.1–0.9)
MONO%: 12.2 % (ref 0.0–14.0)
NEUT%: 49.8 % (ref 39.0–75.0)
NEUTROS ABS: 2.6 10*3/uL (ref 1.5–6.5)
Platelets: 180 10*3/uL (ref 140–400)
RBC: 5.6 10*6/uL (ref 4.20–5.82)
RDW: 13.6 % (ref 11.0–14.6)
WBC: 5.2 10*3/uL (ref 4.0–10.3)

## 2014-05-21 LAB — PROTIME-INR
INR: 3.1 (ref 2.00–3.50)
Protime: 37.2 Seconds — ABNORMAL HIGH (ref 10.6–13.4)

## 2014-05-21 LAB — BASIC METABOLIC PANEL (CC13)
ANION GAP: 8 meq/L (ref 3–11)
BUN: 13.2 mg/dL (ref 7.0–26.0)
CALCIUM: 9.3 mg/dL (ref 8.4–10.4)
CO2: 28 meq/L (ref 22–29)
Chloride: 105 mEq/L (ref 98–109)
Creatinine: 0.9 mg/dL (ref 0.7–1.3)
Glucose: 101 mg/dl (ref 70–140)
Potassium: 4.3 mEq/L (ref 3.5–5.1)
Sodium: 141 mEq/L (ref 136–145)

## 2014-05-21 LAB — POCT INR: INR: 3.1

## 2014-05-21 NOTE — Progress Notes (Addendum)
INR within goal today. INR goal = 2.5-3.5 Hg/Hct:  17.8/50.9, Pltc: 180, stable No missed or extra coumadin doses. No unusual bruising. No bleeding noted. No s/s of clotting noted. No changes in diet or medications. Continue coumadin 10 mg daily. Recheck INR in 7 weeks on 07/09/14 with lab at 12:00pm and coumadin clinic at 12:15pm. Will f/u BMET results.

## 2014-05-21 NOTE — Patient Instructions (Signed)
Continue 10 mg daily of coumadin  Recheck INR on 07/09/14 with lab at 12:00pm and coumadin clinic at 12:15pm.

## 2014-06-18 ENCOUNTER — Other Ambulatory Visit (INDEPENDENT_AMBULATORY_CARE_PROVIDER_SITE_OTHER): Payer: Self-pay

## 2014-06-18 ENCOUNTER — Encounter: Payer: Self-pay | Admitting: Oncology

## 2014-06-18 ENCOUNTER — Ambulatory Visit (HOSPITAL_COMMUNITY)
Admission: RE | Admit: 2014-06-18 | Discharge: 2014-06-18 | Disposition: A | Discharge status: Home / Self Care | Payer: Self-pay | Source: Ambulatory Visit | Attending: Oncology | Admitting: Oncology

## 2014-06-18 ENCOUNTER — Telehealth: Payer: Self-pay | Admitting: *Deleted

## 2014-06-18 ENCOUNTER — Other Ambulatory Visit: Payer: Self-pay | Admitting: Oncology

## 2014-06-18 DIAGNOSIS — I824Y9 Acute embolism and thrombosis of unspecified deep veins of unspecified proximal lower extremity: Secondary | ICD-10-CM | POA: Insufficient documentation

## 2014-06-18 DIAGNOSIS — M79609 Pain in unspecified limb: Secondary | ICD-10-CM

## 2014-06-18 DIAGNOSIS — Z7901 Long term (current) use of anticoagulants: Secondary | ICD-10-CM

## 2014-06-18 LAB — POCT INR: INR: 2.3

## 2014-06-18 LAB — D-DIMER, QUANTITATIVE (NOT AT ARMC): D DIMER QUANT: 0.34 ug{FEU}/mL (ref 0.00–0.48)

## 2014-06-18 NOTE — Telephone Encounter (Signed)
Returned pt's call - stated he thinks he has a blood clot in his leg; stated his leg is swollen and painful. Talked to Dr Beryle Beams who stated pt needs labs done possible today.  Called pt back - who stated he could come now for labs.

## 2014-06-18 NOTE — Progress Notes (Addendum)
Right lower extremity venous duplex completed.  Right:  No evidence of DVT, superficial thrombosis, or Baker's cyst.  Note:  Popliteal artery aneurysm that appears partially thrombosed was found 05-24-13 is still visualized in the popliteal fossa.  Left:  No evidence of DVT in the common femoral vein.

## 2014-06-18 NOTE — Addendum Note (Signed)
Addended by: Orson Gear on: 06/18/2014 04:14 PM   Modules accepted: Orders

## 2014-06-18 NOTE — Progress Notes (Signed)
Patient ID: [name] [name], male   DOB: Dec 16, 1960, 53 y.o.   MRN: [id number] The patient called to report right calf pain which started last Wednesday. It feels like when he had a previous blood clot in that leg. He is on long-term Coumadin anticoagulation due to idiopathic DVT and pulmonary emboli in the past. He reports no respiratory symptoms. On exam, his lips are chronically cyanotic. Lungs are clear. Regular cardiac rhythm. The right calf is not objectively tender. I'm able to palpate the posterior veins very easily and follow them up to the popliteal fossa. There does seem to be a prominent, spongy, area in the popliteal fossa which may be a Baker's cyst or a venous pool. There is a superficial cutaneous area where there is a blister on an erythematous background. Question insect bite.  INR is therapeutic at 2.3 D-dimer is pending.  Clinical suspicion for recurrent DVT is low but due to his concerns and persistent pain I am going to go ahead with a Doppler study today.

## 2014-06-19 ENCOUNTER — Other Ambulatory Visit: Payer: Self-pay | Admitting: *Deleted

## 2014-06-19 ENCOUNTER — Encounter: Payer: Self-pay | Admitting: Oncology

## 2014-06-19 DIAGNOSIS — I998 Other disorder of circulatory system: Secondary | ICD-10-CM

## 2014-06-19 NOTE — Progress Notes (Signed)
Patient ID: [name] [name], male   DOB: [birth year], 53 y.o.   MRN: [id number] Vascular lab technician called me yesterday to report results of venous Doppler examination of the right lower extremity. No new venous thrombosis seen. However, a partially thrombosed popliteal artery noted on a November 2014 study is now completely thrombosed. A D dimer was normal at 0.34. Coumadin was therapeutic at INR 2.3. I let the patient go home. There did not appear to be any vascular compromise on my exam earlier in the day but I do not recall examining his foot. I discussed his situation with vascular surgery today, Dr. Kellie Simmering. They will try to get him in for an evaluation this week. I left a message on the patient's cell phone to expect a call from the surgeon's office.

## 2014-06-20 ENCOUNTER — Encounter: Payer: Self-pay | Admitting: Vascular Surgery

## 2014-06-20 ENCOUNTER — Ambulatory Visit (HOSPITAL_COMMUNITY)
Admission: RE | Admit: 2014-06-20 | Discharge: 2014-06-20 | Disposition: A | Discharge status: Home / Self Care | Payer: Self-pay | Source: Ambulatory Visit | Attending: Vascular Surgery | Admitting: Vascular Surgery

## 2014-06-20 ENCOUNTER — Ambulatory Visit (INDEPENDENT_AMBULATORY_CARE_PROVIDER_SITE_OTHER)
Admission: RE | Admit: 2014-06-20 | Discharge: 2014-06-20 | Disposition: A | Discharge status: Home / Self Care | Payer: Self-pay | Source: Ambulatory Visit | Attending: Vascular Surgery | Admitting: Vascular Surgery

## 2014-06-20 ENCOUNTER — Telehealth: Payer: Self-pay | Admitting: Vascular Surgery

## 2014-06-20 ENCOUNTER — Ambulatory Visit (INDEPENDENT_AMBULATORY_CARE_PROVIDER_SITE_OTHER): Payer: Self-pay | Admitting: Vascular Surgery

## 2014-06-20 VITALS — BP 145/106 | HR 90 | Temp 97.5°F | Resp 18 | Ht 75.0 in | Wt 271.2 lb

## 2014-06-20 DIAGNOSIS — I998 Other disorder of circulatory system: Secondary | ICD-10-CM | POA: Insufficient documentation

## 2014-06-20 DIAGNOSIS — I82401 Acute embolism and thrombosis of unspecified deep veins of right lower extremity: Secondary | ICD-10-CM

## 2014-06-20 DIAGNOSIS — I724 Aneurysm of artery of lower extremity: Secondary | ICD-10-CM

## 2014-06-20 NOTE — Telephone Encounter (Signed)
Called patient multiple times and left messages to call me about his appointment on 06/20/14. Left message for wife as well. Did not receive a call back as of 9am on 06/20/14,.

## 2014-06-20 NOTE — Patient Instructions (Signed)
Please review the tobacco cessation information given to you today. It lists many hints that are useful in your effort to stop smoking. The [facility] contact phone # is [phone] These nurses and advisors offer lots of FREE information and aids to help you quit.    The [facility]uit Smoking line #  800-784-8669, they will also assist you with programs designed to help you stop smoking.    

## 2014-06-20 NOTE — Addendum Note (Signed)
Addended by: Peter Minium K on: 06/20/2014 03:00 PM   Modules accepted: Orders

## 2014-06-20 NOTE — Progress Notes (Signed)
Patient ID: [name] [name], male   DOB: [birth year], 53 y.o.   MRN: [id number]  Reason for Consult: Thrombosed right popliteal artery aneurysm   Referred by Dr. Marlene Bast  Subjective:     HPI:  [name] [name] is a 53 y.o. male who one week ago noted the gradual onset of some pain in his distal right calf which progressed up the calf slightly. This prompted a venous duplex scan which was done on 11:30 which did not show any evidence of DVT on the right lower extremity. However, an incidental finding was a thrombosed right popliteal artery aneurysm. He was sent for vascular consultation.  I do not get any history of claudication, rest pain, or nonhealing ulcers. He was unaware that he had a right popliteal artery aneurysm. A week ago, he developed the gradual onset of pain in the calf which was somewhat suspicious for DVT given his history of polycythemia. However, the patient has been on Coumadin therapy and when his INR was checked last approximately a week ago the Coumadin was therapeutic. The patient denies any pleuritic chest pain or shortness of breath.  His risk factors for vascular disease include hypertension and tobacco use. He denies any history of diabetes, hypercholesterolemia, or family history of premature cardiovascular disease.  Past Medical History  Diagnosis Date  . Benign essential HTN 01/04/2012  . Hyperlipidemia, mixed 01/04/2012  . 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 Sinus bradycardia, chronic 01/04/2012  . Superficial thrombosis of lower extremity 05/02/2012  . Chronic anticoagulation 01/02/2013  . Dermatofibroma of forearm 01/02/2013    Left side   History reviewed. No pertinent family history. History reviewed. No pertinent past surgical history.  He denies any family history of premature cardiovascular disease.  Short Social History:  History  Substance Use Topics  . Smoking status: Current Every Day Smoker -- 0.50 packs/day for 30 years   Types: Cigarettes  . Smokeless tobacco: Not on file     Comment: Varies.  . Alcohol Use: Yes     Comment: Drinks 2-3 nights weekly.    No Known Allergies  Current Outpatient Prescriptions  Medication Sig Dispense Refill  . amLODipine (NORVASC) 5 MG tablet TAKE ONE TABLET BY MOUTH ONE TIME DAILY  30 tablet 4  . triamterene-hydrochlorothiazide (DYAZIDE) 37.5-25 MG per capsule Take 1 each (1 capsule total) by mouth daily. 30 capsule 11  . triamterene-hydrochlorothiazide (DYAZIDE) 37.5-25 MG per capsule Take 1 each (1 capsule total) by mouth daily. 30 capsule 11  . warfarin (COUMADIN) 10 MG tablet Take 1 tablet (10 mg total) by mouth daily. 30 tablet 3   No current facility-administered medications for this visit.    Review of Systems  Constitutional: Negative for chills and fever.  Eyes: Negative for loss of vision.  Respiratory: Negative for cough and wheezing.  Cardiovascular: Positive for claudication. Negative for chest pain, chest tightness, dyspnea with exertion, orthopnea and palpitations.  GI: Negative for blood in stool and vomiting.  GU: Negative for dysuria and hematuria.  Musculoskeletal: Negative for leg pain, joint pain and myalgias.  Skin: Negative for rash and wound.  Neurological: Negative for dizziness and speech difficulty.  Hematologic: Negative for bruises/bleeds easily. Psychiatric: Negative for depressed mood.        Objective:  Objective  Filed Vitals:   06/20/14 1221  BP: 145/106  Pulse: 90  Temp: 97.5 F (36.4 C)  TempSrc: Oral  Resp: 18  Height: 6\' 3"  (1.905 m)  Weight: 271 lb 3.2  oz (123.016 kg)  SpO2: 98%   Body mass index is 33.9 kg/(m^2).  Physical Exam  Constitutional: He is oriented to person, place, and time. He appears well-developed and well-nourished.  HENT:  Head: Normocephalic and atraumatic.  Neck: Neck supple. No JVD present. No thyromegaly present.  Cardiovascular: Normal rate, regular rhythm and normal heart sounds.  Exam  reveals no friction rub.   No murmur heard. Pulses:      Femoral pulses are 2+ on the right side, and 2+ on the left side.      Popliteal pulses are 0 on the right side, and 2+ on the left side.       Dorsalis pedis pulses are 0 on the right side, and 2+ on the left side.       Posterior tibial pulses are 0 on the right side, and 2+ on the left side.  He has mild right lower extremity swelling.  Pulmonary/Chest: Breath sounds normal. He has no wheezes. He has no rales.  Abdominal: Soft. Bowel sounds are normal. There is no tenderness.  I cannot palpate an abdominal aortic aneurysm although his abdomen is difficult to assess because of its size.  Musculoskeletal: Normal range of motion. He exhibits no edema.  Lymphadenopathy:    He has no cervical adenopathy.  Neurological: He is alert and oriented to person, place, and time. He has normal strength. No sensory deficit.  Skin: No lesion and no rash noted.  Psychiatric: He has a normal mood and affect.   Data: I reviewed the lower extremity venous duplex study of the right lower extremity which was performed on 06/18/2014. At that time, there was no evidence of deep vein thrombosis of the right lower extremity. An incidental finding was that the right popliteal artery aneurysm which had been previously noted in November 2014 was now thrombosed.  I have independently interpreted the arterial duplex scan in our office today which shows that the patient does have a thrombosed 3.4 cm right popliteal artery aneurysm. The patient however has reasonable blood flow to the right lower extremity with an ankle-brachial index of 74% and a toe pressure of 43 mmHg. He has a monophasic stricture tibial signal and dorsalis pedis signal with the Doppler. An incidental finding on the arterial duplex today was some nonocclusive thrombus in the popliteal vein on the right which could potentially be new compared to the study on 06/18/2014.     Assessment/Plan:      THROMBOSED RIGHT POPLITEAL ARTERY ANEURYSM: On a venous duplex scan earlier this week, an incidental finding was a thrombosed right popliteal artery aneurysm. I do not think that this explains his symptoms in his right leg as he did not have any significant cyanosis of the foot, claudication, or rest pain on the right lower extremity. He had a study a year ago which showed a right popliteal artery aneurysm with partially occlusive thrombus and I think this went on to occlude silently. He has reasonable perfusion to the right leg with an ABI of 74% and given that the aneurysm is thrombosed there is really no indication for consideration for bypass at this point. I have explained however that there is an association with abdominal aortic aneurysms and I will arrange for a duplex of his infrarenal aorta to rule out an abdominal aortic aneurysm and also a duplex of his left popliteal artery to rule out a left popliteal artery aneurysm.  NONOCCLUSIVE THROMBUS IN THE RIGHT POPLITEAL VEIN: An incidental finding today was  a nonocclusive thrombus in the right popliteal vein which was not seen on the study earlier this week. He is on Coumadin and his Coumadin is therapeutic. I have recommended a follow up study in approximately 2 weeks to see if there is any propagation of the clot. The patient tells me when this is happened in the past Dr. Waymon Budge has placed him on Lovenox for a few days. I do not seen an indication for an IVC filter at this point and certainly he would be at increased risk for IVC thrombosis given his multiple thrombotic problems. We have had a long discussion about the importance of tobacco cessation also. I plan on seeing him back in approximately 2 weeks.   Angelia Mould MD [facility]

## 2014-06-25 ENCOUNTER — Telehealth: Payer: Self-pay | Admitting: *Deleted

## 2014-06-25 NOTE — Telephone Encounter (Signed)
He doesn't need lovenox  He did not have a new clot and his coumadin is therapeutic.  He should follow through with vascular surgeon!!! Keep appt w him

## 2014-06-25 NOTE — Telephone Encounter (Signed)
Call from pt - stated he saw someone (Dr Scot Dock) a few days ago about blood clot. He wants to know if u are going to give him Lovenox injections?  And he is not planning on getting the aorta duplex done as ordered by Dr Scot Dock. Telephone# [phone]. Thanks

## 2014-06-25 NOTE — Telephone Encounter (Signed)
Called /talked to pt - informed him he does not need Lovenox; does not have a new clot; coumadin level is therapeutic and should f/u with vascular surgeon per Dr Beryle Beams. Stated he does have a clot in his leg and Dr Beryle Beams has not read the report.  And he wants Dr Beryle Beams to call him when he returns on Wednesday.

## 2014-06-26 ENCOUNTER — Telehealth: Payer: Self-pay | Admitting: *Deleted

## 2014-06-26 ENCOUNTER — Other Ambulatory Visit: Payer: Self-pay | Admitting: Oncology

## 2014-06-26 MED ORDER — ENOXAPARIN SODIUM 150 MG/ML ~~LOC~~ SOLN: 180.0000 mg | SUBCUTANEOUS | Status: DC

## 2014-06-26 NOTE — Telephone Encounter (Signed)
Talked to pt, after several attempts - informed pt of Lovenox rx, to be taken SQ daily until he has repeat doppler in 2 weeks per/Dr Scot Dock; to stay on Coumadin and he will need a repeated INR this Thurs or Friday. Stated he will call back tomorrow to schedule a time for the lab appt.  And he wanted to know if Dr Beryle Beams will be back tomorrow. Also told pt Dr Beryle Beams did read the report per Dr Nicole Cella office about "Non occlusive vein".

## 2014-06-26 NOTE — Telephone Encounter (Signed)
I read surgeon note  Pt had a non occlusive clot in vein  That is why surgeon wanted him to come back to repeat doppler in 2 weeks. It is OK to go back on lovenox until he repeats the doppler  Dose is 1.5 mg/kg sq daily  Come in to clinic to repeat INR this thurs or Friday  Stay on coumadin. I will put in order to his pharmacy for the lovenox

## 2014-06-26 NOTE — Telephone Encounter (Signed)
Call from [facility], pharmacist at [facility] in [place] - # [phone]  She is asking for clarification on directions for Lovenox -  Directions are: Lovenox 180 mg ( 1.2 mg ) once daily.  You wrote for Lovenox 150 mg syringes so pt will need to use 2 syringes a day.  # 14 will only last 7 days.

## 2014-06-27 ENCOUNTER — Encounter: Payer: Self-pay | Admitting: Pharmacist

## 2014-06-27 NOTE — Progress Notes (Signed)
Pt with recent non occlusive clot in vein His surgeon wants him to come back to repeat doppler in 2 weeks. Per Dr. Beryle Beams: It is OK to go back on lovenox until he repeats the doppler. Dose is 1.5 mg/kg sq daily (180 mg) of Lovenox.  Pt to come in to clinic to repeat INR this Friday at 12pm for lab and 12:15pm for coumadin clinic. Pt is to stay on coumadin. Please discuss plan and INR results with Dr. Beryle Beams on Friday 06/29/14.  [facility] did not have lovenox injections in stock had needed to order. Pt supplied with Lovenox samples (100 mg x 1 box plus 80 mg x 1 box).  Thank you,  Montel Clock, PharmD, BCOP

## 2014-06-27 NOTE — Telephone Encounter (Signed)
Pt [facility] for [facility] 12/11.

## 2014-06-29 ENCOUNTER — Ambulatory Visit (HOSPITAL_BASED_OUTPATIENT_CLINIC_OR_DEPARTMENT_OTHER): Payer: Self-pay | Admitting: Pharmacist

## 2014-06-29 ENCOUNTER — Telehealth: Payer: Self-pay | Admitting: Pharmacist

## 2014-06-29 ENCOUNTER — Other Ambulatory Visit (HOSPITAL_BASED_OUTPATIENT_CLINIC_OR_DEPARTMENT_OTHER): Payer: Self-pay

## 2014-06-29 DIAGNOSIS — I2699 Other pulmonary embolism without acute cor pulmonale: Secondary | ICD-10-CM

## 2014-06-29 LAB — PROTIME-INR

## 2014-06-29 LAB — PROTHROMBIN TIME
INR: 4 — ABNORMAL HIGH (ref <1.50)
PROTHROMBIN TIME: 39.3 s — AB (ref 11.6–15.2)

## 2014-06-29 LAB — POCT INR: INR: 4

## 2014-06-29 NOTE — Telephone Encounter (Signed)
Pt called [facility] stating he got my voicemail. We went back over the dosing plan for anticoag & he understands plan. [name], Pharm.D., CPP 06/29/2014@4 :52 PM

## 2014-06-29 NOTE — Progress Notes (Signed)
INR = 4 (send out verified) on Coumadin 10 mg daily along w/ Lovenox for non-occlusive clot in vein. He reports no bleeding.  He has bruises only on his abdomen from the Lovenox injections. He states he has been taking 1-2 baby ASA daily. INR elevated.  Asymptomatic. He has a repeat doppler sched for 07/27/14 I have discussed w/ Dr. Beryle Beams & he recommends pt stop his Lovenox, decrease baby ASA to 1 tab daily & continue Coumadin 10 mg daily. Return 12/15 for another protime. I left a voicemail with these instructions on pts phone after an attempt to call him earlier this afternoon after I got his final INR result back. NO CHARGE - phone encounter. [name], Pharm.D., CPP 06/29/2014@4 :41 PM

## 2014-06-29 NOTE — Telephone Encounter (Signed)
Thanks

## 2014-07-02 ENCOUNTER — Other Ambulatory Visit (HOSPITAL_COMMUNITY): Payer: Self-pay

## 2014-07-03 ENCOUNTER — Ambulatory Visit (HOSPITAL_BASED_OUTPATIENT_CLINIC_OR_DEPARTMENT_OTHER): Payer: Self-pay | Admitting: Pharmacist

## 2014-07-03 ENCOUNTER — Other Ambulatory Visit (HOSPITAL_BASED_OUTPATIENT_CLINIC_OR_DEPARTMENT_OTHER): Payer: Self-pay

## 2014-07-03 DIAGNOSIS — I2699 Other pulmonary embolism without acute cor pulmonale: Secondary | ICD-10-CM

## 2014-07-03 LAB — PROTIME-INR
INR: 3.5 (ref 2.00–3.50)
PROTIME: 42 s — AB (ref 10.6–13.4)

## 2014-07-03 NOTE — Progress Notes (Signed)
INR = 3.5   Goal 2.5-3.5 INR within goal range. Patient FTKA this morning, seen as a walk in this afternoon. Spoke with Dr. Beryle Beams regarding anticoagulation for this patient. Patient has continued Lovenox despite being told to stop using it last Friday. He states he is "more comfortable" using Lovenox until leg has no pain. Once again told him to stop using Lovenox, declined to give him any more Lovenox samples. He has decreased his ASA dose to 81 mg daily as instructed. He missed appt for repeat doppler yesterday, now scheduled for doppler 07/27/14. He will continue Coumadin 10 mg daily. He will return on 07/16/14 for lab at 1pm and Coumadin clinic at 1:15pm. Stressed need for him to keep this appt.  Theone Murdoch, PharmD

## 2014-07-05 NOTE — Telephone Encounter (Signed)
This med was d/c.

## 2014-07-16 ENCOUNTER — Ambulatory Visit (HOSPITAL_BASED_OUTPATIENT_CLINIC_OR_DEPARTMENT_OTHER): Payer: Self-pay | Admitting: Pharmacist

## 2014-07-16 ENCOUNTER — Other Ambulatory Visit (HOSPITAL_BASED_OUTPATIENT_CLINIC_OR_DEPARTMENT_OTHER): Payer: Self-pay

## 2014-07-16 DIAGNOSIS — I2699 Other pulmonary embolism without acute cor pulmonale: Secondary | ICD-10-CM

## 2014-07-16 LAB — POCT INR: INR: 3.5

## 2014-07-16 LAB — PROTIME-INR
INR: 3.5 (ref 2.00–3.50)
PROTIME: 42 s — AB (ref 10.6–13.4)

## 2014-07-16 NOTE — Progress Notes (Signed)
INR = 3.5    Goal 2.5-3.5 INR within goal range. No complications of anticoagulation noted. Patient has stopped Lovenox, continues ASA 81 mg daily. He plans to eat collard greens on New Year's day. Cautioned him to limit amount consumed. He states that he has been taking Coumadin as directed. Sometimes he will "adjust" his dose by taking a half tab if he has been drinking a lot of beer. Cautioned him that this is not a good idea. He will continue Coumadin 10 mg daily. He will return on 08/13/14 for lab at 1pm and Coumadin clinic at 1:15.  Theone Murdoch, PharmD

## 2014-07-18 ENCOUNTER — Ambulatory Visit: Payer: Self-pay | Admitting: Vascular Surgery

## 2014-07-27 ENCOUNTER — Other Ambulatory Visit (HOSPITAL_COMMUNITY): Payer: Self-pay

## 2014-08-01 ENCOUNTER — Ambulatory Visit: Payer: Self-pay | Admitting: Vascular Surgery

## 2014-08-13 ENCOUNTER — Other Ambulatory Visit: Payer: Self-pay

## 2014-08-13 ENCOUNTER — Ambulatory Visit: Payer: Self-pay

## 2014-08-14 ENCOUNTER — Telehealth: Payer: Self-pay | Admitting: Pharmacist

## 2014-08-14 NOTE — Telephone Encounter (Signed)
LVM for patient. Asked him to call [phone] to reschedule lab and coumadin clinic appt from 08/13/14.

## 2014-08-17 ENCOUNTER — Telehealth: Payer: Self-pay | Admitting: Pharmacist

## 2014-08-17 NOTE — Telephone Encounter (Signed)
Called patient again to try and reschedule Missed coumadin visit. Unable to reach patient. Left VM on cell phone for patient to call back and reschedule  Thank you,  Montel Clock, PharmD, BCOP

## 2014-08-20 ENCOUNTER — Telehealth: Payer: Self-pay | Admitting: Pharmacist

## 2014-08-20 NOTE — Telephone Encounter (Signed)
Pt called [name] [name] asking to be seen in Coumadin clinic (appt overdue). He reports no issues w/ Coumadin.  No recent med changes. He would like to come this Wed 2/3 at 2:30 pm for lab; 2:45 pm for Coumadin clinic. [name], Pharm.D., CPP 08/20/2014@4 :20 PM

## 2014-08-21 ENCOUNTER — Telehealth: Payer: Self-pay | Admitting: Pharmacist

## 2014-08-21 NOTE — Telephone Encounter (Signed)
[name] [name] called again today stating he needed to move is scheduled appointment this week to Friday  New appointment now on Friday 08/24/14 at 2:30pm for lab and 2:45pm for coumadin clinic  Message sent to scheduling  Thank you!  Montel Clock, PharmD, BCOP

## 2014-08-22 ENCOUNTER — Other Ambulatory Visit: Payer: Self-pay

## 2014-08-22 ENCOUNTER — Ambulatory Visit: Payer: Self-pay

## 2014-08-24 ENCOUNTER — Ambulatory Visit (HOSPITAL_BASED_OUTPATIENT_CLINIC_OR_DEPARTMENT_OTHER): Payer: Self-pay | Admitting: Pharmacist

## 2014-08-24 ENCOUNTER — Other Ambulatory Visit (HOSPITAL_BASED_OUTPATIENT_CLINIC_OR_DEPARTMENT_OTHER): Payer: Self-pay

## 2014-08-24 DIAGNOSIS — I2699 Other pulmonary embolism without acute cor pulmonale: Secondary | ICD-10-CM

## 2014-08-24 LAB — PROTIME-INR
INR: 3 (ref 2.00–3.50)
Protime: 36 Seconds — ABNORMAL HIGH (ref 10.6–13.4)

## 2014-08-24 LAB — POCT INR: INR: 3

## 2014-08-24 NOTE — Progress Notes (Signed)
INR = 3 on Coumadin 10 mg daily  No new complaints. Meds same. Pt seeing Dr. Beryle Beams next month.  He has lab appt here on 09/24/14.  We can check his INR that day.  Pt asked if we'd just call him w/ results so he'll leave after his lab appt on 09/24/14. No change to Coumadin dose necessary. [name], Pharm.D., CPP 08/24/2014@3 :05 PM

## 2014-09-20 ENCOUNTER — Telehealth: Payer: Self-pay | Admitting: Oncology

## 2014-09-20 NOTE — Telephone Encounter (Signed)
Call to patient to confirm appointment for 09/24/14 at 2:00 lmtcb

## 2014-09-24 ENCOUNTER — Other Ambulatory Visit (INDEPENDENT_AMBULATORY_CARE_PROVIDER_SITE_OTHER): Payer: Self-pay

## 2014-09-24 ENCOUNTER — Ambulatory Visit (INDEPENDENT_AMBULATORY_CARE_PROVIDER_SITE_OTHER): Payer: Self-pay | Admitting: Pharmacist

## 2014-09-24 DIAGNOSIS — I2699 Other pulmonary embolism without acute cor pulmonale: Secondary | ICD-10-CM

## 2014-09-24 DIAGNOSIS — I82812 Embolism and thrombosis of superficial veins of left lower extremities: Secondary | ICD-10-CM

## 2014-09-24 DIAGNOSIS — I824Y9 Acute embolism and thrombosis of unspecified deep veins of unspecified proximal lower extremity: Secondary | ICD-10-CM

## 2014-09-24 DIAGNOSIS — I10 Essential (primary) hypertension: Secondary | ICD-10-CM

## 2014-09-24 DIAGNOSIS — R001 Bradycardia, unspecified: Secondary | ICD-10-CM

## 2014-09-24 DIAGNOSIS — Z7901 Long term (current) use of anticoagulants: Secondary | ICD-10-CM

## 2014-09-24 DIAGNOSIS — D751 Secondary polycythemia: Secondary | ICD-10-CM

## 2014-09-24 LAB — LIPID PANEL
Cholesterol: 229 mg/dL — ABNORMAL HIGH (ref 0–200)
HDL: 40 mg/dL
LDL Cholesterol: 113 mg/dL — ABNORMAL HIGH (ref 0–99)
Total CHOL/HDL Ratio: 5.7 ratio
Triglycerides: 380 mg/dL — ABNORMAL HIGH
VLDL: 76 mg/dL — ABNORMAL HIGH (ref 0–40)

## 2014-09-24 LAB — COMPREHENSIVE METABOLIC PANEL WITH GFR
ALT: 88 U/L — ABNORMAL HIGH (ref 0–53)
AST: 56 U/L — ABNORMAL HIGH (ref 0–37)
Albumin: 4.2 g/dL (ref 3.5–5.2)
Alkaline Phosphatase: 82 U/L (ref 39–117)
BUN: 17 mg/dL (ref 6–23)
CO2: 28 meq/L (ref 19–32)
Calcium: 9.4 mg/dL (ref 8.4–10.5)
Chloride: 101 meq/L (ref 96–112)
Creat: 1 mg/dL (ref 0.50–1.35)
Glucose, Bld: 91 mg/dL (ref 70–99)
Potassium: 4.2 meq/L (ref 3.5–5.3)
Sodium: 138 meq/L (ref 135–145)
Total Bilirubin: 0.7 mg/dL (ref 0.2–1.2)
Total Protein: 7.1 g/dL (ref 6.0–8.3)

## 2014-09-24 LAB — CBC WITH DIFFERENTIAL/PLATELET
BASOS ABS: 0 10*3/uL (ref 0.0–0.1)
BASOS PCT: 0 % (ref 0–1)
EOS ABS: 0.2 10*3/uL (ref 0.0–0.7)
Eosinophils Relative: 3 % (ref 0–5)
HCT: 51.1 % (ref 39.0–52.0)
Hemoglobin: 17.7 g/dL — ABNORMAL HIGH (ref 13.0–17.0)
Lymphocytes Relative: 29 % (ref 12–46)
Lymphs Abs: 1.5 10*3/uL (ref 0.7–4.0)
MCH: 31.3 pg (ref 26.0–34.0)
MCHC: 34.6 g/dL (ref 30.0–36.0)
MCV: 90.4 fL (ref 78.0–100.0)
MONOS PCT: 12 % (ref 3–12)
MPV: 10.3 fL (ref 8.6–12.4)
Monocytes Absolute: 0.6 10*3/uL (ref 0.1–1.0)
NEUTROS PCT: 56 % (ref 43–77)
Neutro Abs: 2.9 10*3/uL (ref 1.7–7.7)
PLATELETS: 204 10*3/uL (ref 150–400)
RBC: 5.65 MIL/uL (ref 4.22–5.81)
RDW: 15 % (ref 11.5–15.5)
WBC: 5.2 10*3/uL (ref 4.0–10.5)

## 2014-09-24 LAB — POCT INR: INR: 1.83

## 2014-09-24 LAB — PROTIME-INR
INR: 1.83 — ABNORMAL HIGH (ref 0.00–1.49)
Prothrombin Time: 21.3 s — ABNORMAL HIGH (ref 11.6–15.2)

## 2014-09-24 NOTE — Progress Notes (Signed)
INR below goal today at 1.83 *Telephone Encounter - no charge* INR drawn today at Dr. Azucena Freed office on 09/24/14 Spoke to patient over the phone today after multiple messages left on patient's voicemail Pt states he missed 1 dose last week He took 15 mg today (tuesday) as a boost. Per Dr. Beryle Beams recommendations pt to also begin lovenox 180 mg daily (1.5 mg/kg/day) Samples of lovenox left for patient to pick up at pharmacy Pt states he is out of town today and unable to come by until tomorrow (wednesday to pick up lovenox) No unusual bleeding or bruising No S/Sx of clotting No medication or diet changes reported  Plan: Take 15 mg today x 1 dose then Continue Coumadin 10 mg daily  Recheck INR on 10/01/14 at Dr. Beryle Beams office Will try to have INR drawn on 3/9 if patient comes to cancer center

## 2014-09-25 NOTE — Patient Instructions (Signed)
Start lovenox 180 mg daily (samples at cancer center to be picked up) Take 15 mg today x 1 dose then Continue Coumadin 10 mg daily  Recheck INR on 10/01/14 at Dr. Beryle Beams office

## 2014-09-26 ENCOUNTER — Other Ambulatory Visit: Payer: Self-pay

## 2014-09-27 ENCOUNTER — Telehealth: Payer: Self-pay | Admitting: *Deleted

## 2014-09-27 ENCOUNTER — Telehealth: Payer: Self-pay | Admitting: Pharmacist

## 2014-09-27 NOTE — Telephone Encounter (Signed)
Pt called and lm that chris at [place] [facility] was supposed to get him some lovenox but if he couldn't it was all right he would just go without. Called [facility] pharm/ coumadin clinic, they will call him and let him know he can pick it up there. Spoke w/ jess

## 2014-09-27 NOTE — Telephone Encounter (Signed)
LVM that patient can retreive Lovenox samples at [facility] front desk and they would be brought from the injection nurse.

## 2014-10-01 ENCOUNTER — Ambulatory Visit (INDEPENDENT_AMBULATORY_CARE_PROVIDER_SITE_OTHER): Payer: Self-pay | Admitting: Oncology

## 2014-10-01 ENCOUNTER — Encounter: Payer: Self-pay | Admitting: Oncology

## 2014-10-01 VITALS — BP 130/98 | HR 71 | Temp 97.5°F | Ht 75.0 in | Wt 282.1 lb

## 2014-10-01 DIAGNOSIS — D751 Secondary polycythemia: Secondary | ICD-10-CM

## 2014-10-01 DIAGNOSIS — E782 Mixed hyperlipidemia: Secondary | ICD-10-CM

## 2014-10-01 DIAGNOSIS — I2699 Other pulmonary embolism without acute cor pulmonale: Secondary | ICD-10-CM

## 2014-10-01 DIAGNOSIS — Z7901 Long term (current) use of anticoagulants: Secondary | ICD-10-CM

## 2014-10-01 DIAGNOSIS — I724 Aneurysm of artery of lower extremity: Secondary | ICD-10-CM

## 2014-10-01 DIAGNOSIS — I10 Essential (primary) hypertension: Secondary | ICD-10-CM

## 2014-10-01 DIAGNOSIS — D6859 Other primary thrombophilia: Secondary | ICD-10-CM

## 2014-10-01 DIAGNOSIS — D6852 Prothrombin gene mutation: Secondary | ICD-10-CM

## 2014-10-01 HISTORY — DX: Other primary thrombophilia: D68.59

## 2014-10-01 HISTORY — DX: Aneurysm of artery of lower extremity: I72.4

## 2014-10-01 LAB — POCT INR: INR: 3.4

## 2014-10-01 MED ORDER — CO-Q 10 OMEGA-3 FISH OIL PO CAPS: 1.0000 | ORAL_CAPSULE | Freq: Two times a day (BID) | ORAL | Status: DC

## 2014-10-01 MED ORDER — ATORVASTATIN CALCIUM 10 MG PO TABS: 10.0000 mg | ORAL_TABLET | Freq: Every day | ORAL | Status: DC

## 2014-10-01 NOTE — Patient Instructions (Signed)
Start lipitor 10 mg one daily Start omega fish oil capsules 1 twice daily Continue Pro-time checks at cancer center except go to our lab today at [facility] Return visit with Dr Darnell Level in 6 months - lab 1 week before at [facility] center

## 2014-10-01 NOTE — Progress Notes (Signed)
Patient ID: [name] [name], male   DOB: May 10, 1961, 54 y.o.   MRN: [id number] Hematology and Oncology Follow Up Visit  ALEKZANDER CARDELL [id number] 03-03-1961 54 y.o. 10/01/2014 4:53 PM   Principle Diagnosis: Encounter Diagnoses  Name Primary?  . Pulmonary embolism Yes  . Chronic anticoagulation   . Benign essential HTN   . Polycythemia secondary to smoking   . Chronic anticoagulation   . Hyperlipidemia, mixed   Clinical Summary: 54 year old man on chronic Coumadin anticoagulation for recurrent idiopathic pulmonary emboli. He had pulmonary emboli in December 1997, January 1998, October 1998, and in December 1999. He had an episode of superficial phlebitis right lesser saphenous vein in July 2007 when Coumadin level was subtherapeutic. He had a second episode of extensive superficial thrombophlebitis of the left lower extremity in October of 2013. Coumadin temporarily discontinued at that time and he was put on therapeutic doses of lovenox. He developed a deep venous thrombosis in the right femoral and popliteal veins on 05/24/2013. INR was subtherapeutic at 1.6. He was started on Lovenox and his Coumadin dose was increased.  He developed recurrent mid right calf pain on 06/18/2014. There was no objective tenderness on exam. INR was therapeutic at 2.3. D-dimer normal at 0.34. I did go ahead and get Doppler studies which unexpectedly showed a thrombosed right popliteal  artery aneurysm. This aneurysm had been seen but was not thrombosed on a November 2014 study. He was evaluated by vascular surgery Dr. Doren Custard. 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 Incidentally found was some nonocclusive thrombus in the popliteal vein on the right. He given reasonable arterial flow, he he was not felt to be in need for bypass surgery. He continues to smoke but  has cut back to about 5 cigarettes daily. He has persistent polycythemia which I believe is secondary to his cigarette smoking.   Interim History:  He does admit to claudication symptoms with pain in his right leg when he walks a lot. He continues to have intermittent subtherapeutic warfarin levels and admits that he occasionally skips doses. He continues to smoke but has cut back to about 5 cigarettes daily. He has persistent polycythemia which I believe is secondary to his cigarette smoking. He states that he has been compliant with his Norvasc and hydrochlorothiazide. Blood pressure today with normal systolic pressure but borderline diastolic pressure of 98 mm.  Medications: reviewed  Allergies: No Known Allergies  Review of Systems: See HPI Remaining ROS negative:   Physical Exam: Blood pressure 130/98, pulse 71, temperature 97.5 F (36.4 C), temperature source Oral, height 6\' 3"  (1.905 m), weight 282 lb 1.6 oz (127.96 kg), SpO2 97 %. Wt Readings from Last 3 Encounters:  10/01/14 282 lb 1.6 oz (127.96 kg)  06/20/14 271 lb 3.2 oz (123.016 kg)  04/02/14 270 lb 14.4 oz (122.879 kg)     General appearance: plethoric face, red, blue lips HENNT: Pharynx no erythema, exudate, mass, or ulcer. No thyromegaly or thyroid nodules Lymph nodes: No cervical, supraclavicular, or axillary lymphadenopathy Breasts:  Lungs: Clear to auscultation, resonant to percussion throughout Heart: Regular rhythm, no murmur, no gallop, no rub, no click, no edema Abdomen: Soft, obese,  nontender, normal bowel sounds, no mass, no organomegaly Extremities: No edema, no calf tenderness Musculoskeletal: no joint deformities GU:  Vascular: Carotid pulses 2+, no bruits, distal pulses:  Dorsalis pedis 2+ left foot, absent right foot, posterior tibial pulse absent on the right and not easily palpable on the left. Neurologic: Alert, oriented, PERRLA, , cranial nerves grossly normal, motor strength 5 over 5, reflexes  1+ symmetric, upper body coordination normal, gait normal, Skin: No rash or ecchymosis  Lab Results: CBC W/Diff    Component Value Date/Time   WBC 5.2 09/24/2014 1413   WBC 5.2 05/21/2014 1228   WBC 5.2 09/20/2009 1205   RBC 5.65 09/24/2014 1413   RBC 5.60 05/21/2014 1228   RBC 5.59 09/20/2009 1205   HGB 17.7* 09/24/2014 1413   HGB 17.8* 05/21/2014 1228   HGB 17.8* 09/20/2009 1205   HCT 51.1 09/24/2014 1413   HCT 50.9* 05/21/2014 1228   HCT 52.5* 09/20/2009 1205   PLT 204 09/24/2014 1413   PLT 180 05/21/2014 1228   PLT 162 09/20/2009 1205   MCV 90.4 09/24/2014 1413   MCV 90.9 05/21/2014 1228   MCV 94 09/20/2009 1205   MCH 31.3 09/24/2014 1413   MCH 31.8 05/21/2014 1228   MCH 31.9 09/20/2009 1205   MCHC 34.6 09/24/2014 1413   MCHC 35.0 05/21/2014 1228   MCHC 34.0 09/20/2009 1205   RDW 15.0 09/24/2014 1413   RDW 13.6 05/21/2014 1228   RDW 12.6 09/20/2009 1205   LYMPHSABS 1.5 09/24/2014 1413   LYMPHSABS 1.8 05/21/2014 1228   LYMPHSABS 1.5 03/22/2009 1236   MONOABS 0.6 09/24/2014 1413   MONOABS 0.6 05/21/2014 1228   EOSABS 0.2 09/24/2014 1413   EOSABS 0.2 05/21/2014 1228   EOSABS 0.1 03/22/2009 1236   BASOSABS 0.0 09/24/2014 1413   BASOSABS 0.0 05/21/2014 1228   BASOSABS 0.2 03/22/2009 1236     Chemistry      Component Value Date/Time   NA 138 09/24/2014 1413   NA 141 05/21/2014 1226   NA 139 07/30/2008 1121   K 4.2 09/24/2014 1413   K 4.3 05/21/2014 1226   K 4.5 07/30/2008 1121   CL 101 09/24/2014 1413   CL 108* 12/28/2012 1405   CL 106 07/30/2008 1121   CO2 28 09/24/2014 1413   CO2 28 05/21/2014 1226   CO2 27 07/30/2008 1121   BUN 17 09/24/2014 1413   BUN 13.2 05/21/2014 1226   BUN 13 07/30/2008 1121   CREATININE 1.00 09/24/2014 1413   CREATININE 0.9 05/21/2014 1226   CREATININE 1.09 01/04/2012 1101      Component Value Date/Time   CALCIUM 9.4 09/24/2014 1413   CALCIUM 9.3 05/21/2014 1226   CALCIUM 9.0 07/30/2008 1121   ALKPHOS 82 09/24/2014 1413    ALKPHOS 87 01/08/2014 1203   ALKPHOS 59 07/30/2008 1121   AST 56* 09/24/2014 1413   AST 50* 01/08/2014 1203   AST 23 07/30/2008 1121   ALT 88* 09/24/2014 1413   ALT 83* 01/08/2014 1203   ALT 22 07/30/2008 1121   BILITOT 0.7 09/24/2014 1413   BILITOT 0.67 01/08/2014 1203   BILITOT 0.60 07/30/2008 1121       Radiological Studies: See discussion above    Impression:  #1. 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 History of recurrent DVT and pulmonary emboli secondary to #1  Recurrent events every time he lets his Coumadin become subtherapeutic. #3. History of superficial thrombophlebitis October 2013  #4. Chronic asymptomatic sinus bradycardia  #5. History of transient atrial fibrillation occurring 2 years ago no recurrence.  #6. Essential hypertension.  Blood pressure remains borderline elevated   #7.  Smoker's polycythemia        He continues to smoke. I addressed this again today. #8. Dermatofibroma left forearm  #9. hyperlipidemia with prominent hypertriglyceridemia currently not on medication. He is encouraged to go back on omega-3 fish capsules which we talked about at time of last visit but he never started. I'm going to start him on Lipitor 10 mg daily. #10. Health maintenance:  he still has not had a baseline colonoscopy.   CC: Patient Care Team: Annia Belt, MD as PCP - General (Oncology)   Annia Belt, MD 3/14/20164:53 PM

## 2014-10-02 ENCOUNTER — Ambulatory Visit: Payer: Self-pay | Admitting: Pharmacist

## 2014-10-02 DIAGNOSIS — I2699 Other pulmonary embolism without acute cor pulmonale: Secondary | ICD-10-CM

## 2014-10-02 NOTE — Progress Notes (Signed)
INR at goal today t 3.4 (goal 2.5-3.5) *No Charge - Telephone Encounter* - Patient of Dr. Beryle Beams Patient had INR drawn at Dr. Beryle Beams office visit Called patient to discuss INR but unable tor each patient. Left voicemail with instructions and for patient to call back to schedule appointment Instructed patient to stop lovenox as his INR is now at goal Continue 10 mg daily of coumadin Pt starting Lipitor per Dr. Beryle Beams which could effect his INR. We will assess on next INR check and determine if slight decreased dose is needed Pt to call to schedule next INR check in ~ 4 weeks Left instructions on message for patient to call with any concerns or issues regarding anti-coagulation or increased bruising/bleeding  Plan: Continue Coumadin 10 mg daily  Recheck INR in one month

## 2014-10-02 NOTE — Patient Instructions (Signed)
INR at goal Stop lovenox Continue Coumadin 10 mg daily  Recheck INR in one month

## 2014-10-07 ENCOUNTER — Other Ambulatory Visit: Payer: Self-pay | Admitting: Oncology

## 2014-11-01 ENCOUNTER — Telehealth: Payer: Self-pay | Admitting: Pharmacist

## 2014-11-01 NOTE — Telephone Encounter (Signed)
Pt is due for follow up lab/INR check and coumadin clinic visit. Called and left VM for patient to call back to schedule appointment in the next ~1-2 weeks  Thank you,  Montel Clock, PharmD, BCOP

## 2014-11-11 NOTE — H&P (Signed)
PATIENT NAME:  [name] [name], [name] [name] MR#:  [id number] DATE OF BIRTH:  06-23-1961  DATE OF ADMISSION:  10/15/2011  PRIMARY CARE PHYSICIAN: Dr. Laurann Montana with [facility]  CHIEF COMPLAINT: Irregular heart beat preceded earlier by near syncope.   HISTORY OF PRESENT ILLNESS: [name] [name] is a 54 year old pleasant Caucasian male with a prior history of pulmonary embolism x2 in 1998 and then in 1999. He is maintained on chronic anticoagulation with Coumadin. The patient reports that he missed a few doses of Coumadin in the last few days stating that he did not to want to mix that with the alcohol. He also indicated that last evening while he was drinking whiskey he inhaled some of the alcohol. He choked and he started to cough, and then he felt weakness in his lower extremities, then he fell to the ground without losing consciousness. This episode had subsided and he recovered. However, after about 2 to 3 hours he noticed irregular heartbeats and palpitations in his left side of the chest. This is the time he noticed that, and then he decided later to come to the Emergency Department for evaluation. Here an EKG revealed atrial fibrillation with rapid ventricular rate. The patient was admitted for further evaluation and management.    REVIEW OF SYSTEMS: CONSTITUTIONAL: Denies any fever. No chills. No night sweats. No fatigue. EYES: No blurring of vision. No double vision. ENT: No hearing impairment. No sore throat. No dysphagia. CARDIOVASCULAR: No chest pain. No shortness of breath. No edema. He had near syncope earlier as I stated above, also, he felt palpitations. RESPIRATORY: He had some cough earlier just for a few minutes after choking with the alcohol; however, he felt fine after that. No sputum production. No chest pain. GASTROINTESTINAL: No abdominal pain. No vomiting, and no diarrhea. GENITOURINARY: No dysuria. No frequency of urination. MUSCULOSKELETAL: No joint pain or swelling. No muscular pain or swelling.  INTEGUMENTARY: No skin rash. No ulcers. NEUROLOGY: No focal weakness. No seizure activity. No headache. PSYCHIATRY: No anxiety. No depression. ENDOCRINE: No polyuria or polydipsia. No heat or cold intolerance.   PAST MEDICAL HISTORY: History of pulmonary embolism in 1998 and again in 1999 after stopping the Coumadin. After that, he maintained on chronic anticoagulation with Coumadin. Apparently he has some problem with blood clotting but does not recall the exact condition.  The patient also has history of tobacco abuse.   FAMILY HISTORY: He has a cousin who has atrial fibrillation. His mother suffered from skin cancer. His father died from stroke.   SOCIAL HISTORY: He is married. 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 He is unmarried and living with his girlfriend.   SOCIAL HABITS: Chronic smoker 1/2 pack a day for 30 years. He drinks alcohol about 2 to 3 times a week, primarily vodka, about two glasses. He mixes that with milk. His last drink was last evening. He took a white [place], and this is the time when he inhaled it and choked; and after two hours his palpitations started. No other drug abuse.   ADMISSION MEDICATIONS: Coumadin 10 mg a day. He is not very compliant with that and missed several doses in the last few days.   ALLERGIES: No known drug allergies.   PHYSICAL EXAMINATION:  VITAL SIGNS: Blood pressure 130/89, respiratory rate 18, pulse 135 per minute and regular, temperature 97.4, oxygen saturation 95%.   GENERAL APPEARANCE: A middle-aged male sitting on the stretcher in no acute distress, healthy  looking.   HEENT: Head: No pallor. No icterus. No cyanosis. Ears, nose and throat: Hearing was normal. Nasal mucosa, lips, tongue were normal. Eyes: Examination revealed normal iris and conjunctivae, although the conjunctivae are slightly congested. Pupils are about 4 to 5 mm, equal and reactive to light.   NECK: Supple. Trachea at  midline. No thyromegaly. No cervical lymphadenopathy. No masses.   HEART: Normal S1, S2. No S3 or S4. It is regular by the time of my auscultation. No murmur. No carotid bruits.   RESPIRATORY: Normal breathing pattern without use of accessory muscles. No rales. No wheezing.   ABDOMEN: Soft without tenderness. No hepatosplenomegaly. No masses. No hernias.   MUSCULOSKELETAL: No joint swelling. No clubbing.  SKIN: No ulcers. No subcutaneous nodules.   NEUROLOGICAL: Cranial nerves II through XII are intact. No focal motor deficit.   PSYCHIATRIC: The patient was alert and oriented x3. Mood and affect were normal.   LABORATORY, DIAGNOSTIC AND RADIOLOGICAL DATA:  Chest x-ray showed no consolidation, no effusion.  CT scan of the chest results are pending.  His EKG showed atrial fibrillation with rapid ventricular rate at 124 per minute. Otherwise unremarkable EKG.   His subsequent EKG now is sinus rhythm at 75 per minute. First degree AV block. Left axis deviation. Serum glucose 88, BUN 15, creatinine 1.09, sodium 142, potassium 3.9.  Liver function tests were normal with the exception of elevated liver transaminases. His AST is 44, ALT 79.  Total CPK 162. Troponin less than 0.02.  CBC showed a white count of 8000, hemoglobin 17, hematocrit 51, platelet count 190. Prothrombin time was 17.5 with INR of 1.4.   IMPRESSION:  1. New onset of atrial fibrillation with rapid ventricular rate, converted to normal sinus rhythm after receiving two doses of intravenous diltiazem 10 mg each.  2. Near syncope. This episode happened during inhalation of alcohol accidentally. He started to cough and then developed near syncope without true loss of consciousness. His palpitations started two hours after this episode.  3. Chronic anticoagulation with Coumadin with noncompliance. He missed a few doses.  4. Elevated liver transaminases consistent with his alcoholic hepatitis. The patient tells me that his liver  enzymes were even higher as was checked with his primary care physician, and then was dropping.  5. Tobacco abuse.  6. History of pulmonary embolism in 1998 and 1999 in association with unidentified serum clotting problem or factor deficiency.   PLAN: The patient was admitted to the telemetry unit. Follow on cardiac enzymes. Check TSH and obtain echocardiogram. Cardiology consultation. He started anticoagulation with Lovenox 1 mg/kg subcutaneous twice a day. I ordered his Coumadin to be given now. I advised the patient to quit smoking indefinitely. I offered nicotine patch, but he refused stating that he does not need that. He also needs to quit alcohol. We need to follow up on the results of his CAT scan of the chest which was a PE study. The technician tells me now that it was not an optimal study. I do not think that we need to pursue that because his presentation is not that of pulmonary embolism.   TIME SPENT:   Time spent in evaluating this patient took more than 55 minutes.   ____________________________ Clovis Pu. Lenore Manner, MD amd:cbb D: 10/15/2011 04:30:19 ET T: 10/15/2011 08:20:04 ET JOB#: [id number]  cc: Clovis Pu. Lenore Manner, MD, <Dictator> [facility]. [facility] [place], [place] Mike Craze [name] MD ELECTRONICALLY SIGNED 10/20/2011 23:37

## 2014-11-11 NOTE — Consult Note (Signed)
Brief Consult Note: Diagnosis: A fib wih RVR, resolved as converted with Diltiazem and in NSR.   Consult note dictated.   Comments: Patient with a fib with RVR after drinking ETOH/choking. He denies CP, chest pressure, SOB, no acute EKG changes, and no significant risk factors for sleep apnea. Has h/o PE, CTA poor quality but negative. Echo has been done and awaiting interpretation. His  BP sligthly elevated and would suggest adding beta-blocker on d/c and patient can follow-up Mon. April 1st at 9:00am with Dr. Laurelyn Sickle at [facility]. He is anxious for d/c home and is stable from cardiac standpoint.  Electronic Signatures: Angelica Ran (MD)   (Signed 28-Mar-13 13:07)  Authored: Brief Consult Note Manzi, Monica A (PA-C)   Entered: Brief Consult Note  Last Updated: 28-Mar-13 13:07 by Angelica Ran (MD)

## 2014-11-11 NOTE — Consult Note (Signed)
PATIENT NAME:  [name] [name], [name] [name] MR#:  [id number] DATE OF BIRTH:  06-19-1961  DATE OF CONSULTATION:  10/15/2011  REFERRING PHYSICIAN:  Wilfred Curtis, MD CONSULTING PHYSICIAN:  Merla Riches, PA-C  REASON FOR CONSULTATION: Atrial fibrillation with rapid ventricular response.   HISTORY OF PRESENT ILLNESS: Mr. [name] [name] is a pleasant 54 year old white male. The patient was drinking some alcohol yesterday which "went down the wrong pipe". He began coughing, felt, shaky, and he fell to the ground. The patient never did lose consciousness, but went to bed about three hours later and noticed that his heart was pounding at a fast rate and it was irregular. His girlfriend, who is a Marine scientist, took his pulse and confirmed that he had an irregular fast rhythm and he was brought to the emergency department. During this episode, the patient denies any chest pain, chest pressure, palpitations, shortness of breath, coughing, or peripheral edema. He has chronic wheezing, which he believes is secondary to smoking. He denies any associated claudication symptoms.   The patient states that he may have had a stress test and echocardiogram, but it has been several years ago. Of note, he has a history of recurrent pulmonary embolism for which he takes warfarin 10 mg daily. Since he did not want to "thin his blood" too much with the alcohol, he skipped two doses prior to his admission.   The patient states that he does not snore usually, is not tired in the morning, does not toss and turn or sweat excessively, and does not seem to stop breathing while sleeping. He has never been diagnosed with sleep apnea in the past.   PAST MEDICAL HISTORY:  1. Pulmonary embolism in 1998 and in 1999 after stopping Coumadin. 2. Coagulopathy of unknown type, followed by Dr. Beryle Beams  3. Borderline hypertension.  4. Nicotine dependence/tobacco abuse.   PAST SURGICAL HISTORY: No surgery.  DRUG ALLERGIES: No known drug allergies.     HOME MEDICATIONS: Coumadin 10 mg p.o. daily (the patient has missed several doses in the last few days).   FAMILY HISTORY: No family history of coronary artery disease or hypertension. His father died from an acute CVA. He has a cousin with atrial fibrillation.   SOCIAL HISTORY: The patient currently lives with his girlfriend. 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 He is a chronic smoker and has smoked 1/2 pack of cigarettes per day for at least 30 years. He denies any illicit drug use. He drinks alcohol about 2 to 3 times a week (primarily vodka), about two glasses.   REVIEW OF SYSTEMS: The patient complains of shortness of breath on exertion and cough. He denies fever, nausea, vomiting, abdominal pain, edema, and orthopnea. See the History of Present Illness.   PHYSICAL EXAMINATION:   GENERAL: This is a pleasant white male who is anxious for discharge. He is alert and oriented x3.   VITAL SIGNS: Temperature 98.2 degrees Fahrenheit, heart rate 68, respiratory rate 20, blood pressure 137/85, and oxygen saturation 96%.   HEAD: Head atraumatic, normocephalic.   EYES: Pupils are round and equal bilaterally, reactive to light. There is no scleral icterus. Conjunctivae are pink.  EARS, NOSE, AND THROAT: Normal external inspection. Mouth good dentition. Moist mucous membranes.   NECK: Supple. Trachea is midline. Thyroid is smooth and mobile.   LUNGS: Clear to auscultation bilaterally with some diminished breath sounds bilaterally, but no adventitious sounds appreciated.   HEART: Regular rate and rhythm. No murmurs, rubs, or gallops appreciated. No  carotid bruits.   ABDOMEN: Nondistended. Bowel sounds present in all four quadrants. There is no rebound tenderness, guarding, peritoneal signs, or hepatosplenomegaly.   EXTREMITIES: No cyanosis, clubbing, or edema. The patient has diminished left posterior tibial pulse.  ANCILLARY DATA: Labs: Glucose 88, BUN 15, creatinine 1.09,  sodium 142, potassium 3.9, chloride 105, and CO2 27. Estimated GFR is greater than 60. Ethanol 97 and ethanol percent is 0.097. Total protein 7.7, albumin 4.1, total bilirubin 0.4, alkaline phosphatase 81, SGOT 44, and SGPT 79. Total CK 162, CK-MB 3.4, and troponin I less than 0.02. TSH 3.35. White blood cell count 8.1, hemoglobin 17.6, hematocrit 51.9, and platelet count 190,000. PT 17.5. INR 1.4.   EKG on admission with atrial fibrillation with rapid ventricular response.   EKG after diltiazem: Normal sinus rhythm with first degree AV block.  Review of telemetry with normal sinus rhythm, 61 beats per minute.   RADIOLOGIC DATA: Chest x-ray: No acute changes identified.   CT of chest is suboptimal for diagnosis of acute pulmonary embolism, no definite filling defects seen. There is bibasilar atelectasis.   ASSESSMENT/PLAN:  1. Atrial fibrillation with rapid ventricular response: The patient's arrhythmia could be secondary to alcohol/holiday heart syndrome, and less likely from coronary artery disease, sleep apnea. The patient is chest pain free and cardiac enzymes have been negative x3. Echo has been ordered to rule out any possible thrombus. The patient has converted with diltiazem 10 mg and is currently in normal sinus rhythm.  2. Borderline hypertension: The patient notes that blood pressure has averaged about 140/90 over the last several years. Due to his atrial fibrillation with RVR we will initiate low-dose beta blocker with metoprolol succinate 25 mg once daily, which he should be discharged on.  3. History of recurrent pulmonary embolism: The patient has been maintained on warfarin but needs to be more compliant as he has underlying unknown coagulopathy. 4. Hepatitis: The patient has had elevations in his transaminase in the past most likely secondary to alcohol.   The patient will follow-up in the office on Monday, 10/19/2011, at 9 o'clock a.m. We will reassess the patient at that time to  see if stress test is warranted. He is stable at for discharge from a cardiac standpoint. Thank you for allowing [place] to participate in this patient's care. The patient's care was discussed with Dr. Neoma Laming who agreed with the assessment and management of the patient as above. ____________________________ Merla Riches, PA-C mam:slb D: 10/15/2011 13:56:41 ET T: 10/15/2011 14:19:00 ET JOB#: [id number]  cc: Merla Riches, PA-C, <Dictator> Pericles Carmicheal A Melecio Cueto PA ELECTRONICALLY SIGNED 10/19/2011 13:07

## 2014-11-11 NOTE — Discharge Summary (Signed)
PATIENT NAME:  [name] [name], PUCCI MR#:  [id number] DATE OF BIRTH:  [birth year]  DATE OF ADMISSION:  10/15/2011 DATE OF DISCHARGE:  10/15/2011  ADMITTING DIAGNOSIS: Atrial fibrillation.  DISCHARGE DIAGNOSES:   1. Atrial fibrillation, resolved, in sinus rhythm now.  2. Presyncope due to atrial fibrillation versus choking episode.  3. Suspected chronic obstructive pulmonary disease.  4. Ongoing tobacco abuse.  5. Alcohol abuse.  6. Mild alcoholic hepatitis. 7. History of pulmonary embolism, on Coumadin therapy, noncompliant, with discharge proTime level of 1.4 on 10/15/2011.   DISCHARGE CONDITION:  Stable.  DISCHARGE MEDICATIONS: The patient is to resume his outpatient medications which are:  Coumadin 10 mg p.o. daily.  Additional medications: The patient was advised to take an extra dose of Coumadin when he gets back home today. He is also advised to return back to his primary care physician and have his Coumadin level checked approximately 1 to 2 days after discharge.  New medications:  1. Toprol-XL 25 mg p.o. daily.  2. Nicotine oral inhaler, one cartridge every one hour as needed.  3. Nicotine patch 21 mg topically daily.  4. Combivent 2 puffs every four hours as needed.  The patient was advised to use it with OptiChamber.  The patient was also advised to stop smoking.   FOLLOWUP:  1. He is to follow up with Dr. Neoma Laming on Monday, 10/19/2011 at 14:00 hours.  2. Follow up with primary care physician, Dr. Inda Merlin, two days after discharge.  CONSULTANTS:  Dr. Neoma Laming.   RADIOLOGIC STUDIES: Echocardiogram 10/15/2011: Normal chamber size and function with trace tricuspid regurgitation,  ejection fraction equal or more than 55%. Left ventricle grossly normal size. Left ventricular systolic function is normal. Left atrium normal size. Mitral valve is normal in structure and function. Mild tricuspid regurgitation was noted. No other abnormalities were seen.   Chest x-ray, portable  single view on 10/15/2011 showed no acute changes.   CT of chest with IV dye to rule out pulmonary embolism on 10/15/2011 revealed suboptimal study for diagnosis of acute pulmonary embolism. No definite filling defect was seen. If strong clinical concern, ventilation perfusion lung scan could be performed. There is bibasilar atelectasis according to the radiologist.  HISTORY OF PRESENT ILLNESS:  The patient is a 54 year old Caucasian male with history of smoking who presented to the hospital with complaints of not feeling well. Apparently he had a near-syncopal episode earlier today after he choked on some fluid. He started coughing and then suddenly became very weak in his lower extremities and he fell down on the ground without losing consciousness. Later on he developed palpitations and decided to come to the Emergency Room for further evaluation. In the Emergency Room, his EKG showed atrial fibrillation with/RVR rate of 135 beats per minute, but no acute ST-T changes were noted. The patient's physical examination was unremarkable except for diminished breath sounds on lung auscultation. His BMP was unremarkable. The patient's alcohol level was elevated at 0.097. Liver enzymes showed AST elevation to 44, ALT 79. Cardiac enzymes times two did not show any abnormalities. TSH was normal at 3.35. CBC within normal limits. ProTime was 13.5, INR 1.4. Urinalysis was unremarkable. Initially, as mentioned above, the patient's EKG revealed a rate of 124 and left axis deviation, but no acute ST-T changes. Repeat EKG approximately an hour later revealed sinus rhythm.  The patient was admitted to the hospital for further evaluation.  Consultation with a cardiologist was obtained. Cardiologist Dr. Neoma Laming saw the patient in  consultation on 10/15/2011. He felt that the patient presented with atrial fibrillation/ rapid ventricular response and now converted with diltiazem to normal sinus rhythm. He felt that very  likely it occurred due to his alcohol abuse and choking. There was no chest pain or chest pressure or shortness of breath or acute EKG changes. Also, the patient had no risk factors such as sleep apnea and CT showed no pulmonary embolism, however the quality was somewhat poor.  Echocardiogram was also unremarkable. Initially his blood pressure was elevated, so he recommended adding a beta blocker and discharge the patient with followup with him on 10/19/2011 at [facility]. He felt that the patient was anxious to return back home, and according to him he was stable to return back home. The patient remained in sinus rhythm through his stay in the hospital and is being discharged with stable vital signs: Temperature 98.2, pulse 68, respiratory rate 20, blood pressure 137/85, saturation 96% on room air at rest. In regards to other medical issues, it was felt that the patient very likely has underlying chronic obstructive pulmonary disease because of history of tobacco abuse as well as significantly diminished breath sounds. The patient was advised to stop smoking and that was discussed with him quite extensively. He was also offered nicotine replacement therapy which he agreed to try to use.  Likely chronic obstructive pulmonary disease and intermittent wheezing: The patient was advised to use Combivent which was prescribed for him upon discharge. The patient is to continue nicotine replacement therapy as well as Combivent and follow up with his primary care physician for further recommendations.   In regards to coagulopathy and pulmonary embolism in the past, the patient is to continue Coumadin therapy. He was advised to take an extra 10 mg of Coumadin and follow up with his primary care physician to check his proTime in a short while to ensure the patient's proTime is therapeutic.   The patient is being discharged in stable condition with above-mentioned medications and followup.   TIME SPENT: 40  minutes.  ____________________________ Theodoro Grist, MD rv:bjt D: 10/15/2011 20:34:04 ET T: 10/18/2011 14:58:48 ET JOB#: [id number]  cc: Theodoro Grist, MD, <Dictator> Dionisio David, MD Dr. Julaine Fusi MD ELECTRONICALLY SIGNED 10/21/2011 14:05

## 2014-11-23 ENCOUNTER — Telehealth: Payer: Self-pay | Admitting: Pharmacist

## 2014-11-23 NOTE — Telephone Encounter (Signed)
Pt last seen in Coumadin clinic 10/02/14. I left another voicemail to try to reschedule missed appt. [name], Pharm.D., CPP 11/23/2014@2 :13 PM

## 2014-12-10 ENCOUNTER — Ambulatory Visit (HOSPITAL_BASED_OUTPATIENT_CLINIC_OR_DEPARTMENT_OTHER): Payer: Self-pay | Admitting: Pharmacist

## 2014-12-10 ENCOUNTER — Other Ambulatory Visit (HOSPITAL_BASED_OUTPATIENT_CLINIC_OR_DEPARTMENT_OTHER): Payer: Self-pay

## 2014-12-10 DIAGNOSIS — Z86711 Personal history of pulmonary embolism: Secondary | ICD-10-CM

## 2014-12-10 DIAGNOSIS — Z86718 Personal history of other venous thrombosis and embolism: Secondary | ICD-10-CM

## 2014-12-10 DIAGNOSIS — I2699 Other pulmonary embolism without acute cor pulmonale: Secondary | ICD-10-CM

## 2014-12-10 LAB — PROTIME-INR
INR: 2.4 (ref 2.00–3.50)
Protime: 28.8 Seconds — ABNORMAL HIGH (ref 10.6–13.4)

## 2014-12-10 LAB — POCT INR: INR: 2.4

## 2014-12-10 NOTE — Progress Notes (Signed)
Pt seen in clinic today INR 2.4 with goal 2.5-3.5 He states there have been days he missed a dose Will not change his current dose No new meds.  He does not take his dyazide as he states it made him gain weight. Diet cosistent Will call and make his f/u appmt with Dr. Darnell Level due in Sept 2016 No med refills needed at this visit  Pt will continue 10 mg daily RTC in 2 mos on July 18.  Lab at 1:30pm CC at 1:45 pm

## 2014-12-10 NOTE — Patient Instructions (Signed)
Pt will continue 10 mg daily RTC in 2 mos on July 18.  Lab at 1:30pm CC at 1:45 pm

## 2015-02-01 ENCOUNTER — Other Ambulatory Visit: Payer: Self-pay | Admitting: Pharmacist

## 2015-02-01 DIAGNOSIS — I2699 Other pulmonary embolism without acute cor pulmonale: Secondary | ICD-10-CM

## 2015-02-04 ENCOUNTER — Other Ambulatory Visit (HOSPITAL_BASED_OUTPATIENT_CLINIC_OR_DEPARTMENT_OTHER): Payer: Self-pay

## 2015-02-04 ENCOUNTER — Ambulatory Visit (HOSPITAL_BASED_OUTPATIENT_CLINIC_OR_DEPARTMENT_OTHER): Payer: Self-pay | Admitting: Pharmacist

## 2015-02-04 DIAGNOSIS — I2699 Other pulmonary embolism without acute cor pulmonale: Secondary | ICD-10-CM

## 2015-02-04 LAB — PROTIME-INR
INR: 2.4 (ref 2.00–3.50)
Protime: 28.8 Seconds — ABNORMAL HIGH (ref 10.6–13.4)

## 2015-02-04 LAB — POCT INR: INR: 2.4

## 2015-02-04 NOTE — Progress Notes (Signed)
**  Dr. Beryle Beams pt**  INR right at low end of goal today.  No med changes.  No bleeding or bruising.  No other medical changes.  Will continue coumadin 10mg  daily and check PT/INR in 2 months at Dr. Beryle Beams office with MD appt.

## 2015-02-13 ENCOUNTER — Other Ambulatory Visit: Payer: Self-pay | Admitting: *Deleted

## 2015-02-13 ENCOUNTER — Telehealth: Payer: Self-pay | Admitting: Oncology

## 2015-02-13 MED ORDER — AMLODIPINE BESYLATE 5 MG PO TABS: 5.0000 mg | ORAL_TABLET | Freq: Every day | ORAL | Status: DC

## 2015-02-13 NOTE — Telephone Encounter (Signed)
Patient requesting his Norvasc to be  Refilled @ [facility] in [place], [place].

## 2015-02-13 NOTE — Telephone Encounter (Signed)
Request sent to dr Beryle Beams

## 2015-04-09 ENCOUNTER — Ambulatory Visit: Payer: Self-pay | Admitting: Oncology

## 2015-05-14 ENCOUNTER — Ambulatory Visit (INDEPENDENT_AMBULATORY_CARE_PROVIDER_SITE_OTHER): Payer: Self-pay | Admitting: Oncology

## 2015-05-14 ENCOUNTER — Encounter: Payer: Self-pay | Admitting: Oncology

## 2015-05-14 VITALS — BP 152/87 | HR 72 | Temp 97.5°F | Ht 75.0 in | Wt 283.9 lb

## 2015-05-14 DIAGNOSIS — Z86711 Personal history of pulmonary embolism: Secondary | ICD-10-CM

## 2015-05-14 DIAGNOSIS — Z7901 Long term (current) use of anticoagulants: Secondary | ICD-10-CM

## 2015-05-14 DIAGNOSIS — R030 Elevated blood-pressure reading, without diagnosis of hypertension: Secondary | ICD-10-CM

## 2015-05-14 DIAGNOSIS — R001 Bradycardia, unspecified: Secondary | ICD-10-CM

## 2015-05-14 DIAGNOSIS — D689 Coagulation defect, unspecified: Secondary | ICD-10-CM

## 2015-05-14 DIAGNOSIS — I724 Aneurysm of artery of lower extremity: Secondary | ICD-10-CM

## 2015-05-14 DIAGNOSIS — D6859 Other primary thrombophilia: Secondary | ICD-10-CM

## 2015-05-14 DIAGNOSIS — I10 Essential (primary) hypertension: Secondary | ICD-10-CM

## 2015-05-14 DIAGNOSIS — Z86718 Personal history of other venous thrombosis and embolism: Secondary | ICD-10-CM

## 2015-05-14 DIAGNOSIS — E782 Mixed hyperlipidemia: Secondary | ICD-10-CM

## 2015-05-14 DIAGNOSIS — I2782 Chronic pulmonary embolism: Secondary | ICD-10-CM

## 2015-05-14 DIAGNOSIS — E785 Hyperlipidemia, unspecified: Secondary | ICD-10-CM

## 2015-05-14 DIAGNOSIS — F17210 Nicotine dependence, cigarettes, uncomplicated: Secondary | ICD-10-CM

## 2015-05-14 DIAGNOSIS — D751 Secondary polycythemia: Secondary | ICD-10-CM

## 2015-05-14 DIAGNOSIS — E669 Obesity, unspecified: Secondary | ICD-10-CM

## 2015-05-14 DIAGNOSIS — I825Y1 Chronic embolism and thrombosis of unspecified deep veins of right proximal lower extremity: Secondary | ICD-10-CM

## 2015-05-14 LAB — PROTIME-INR
INR: 1.98 — AB (ref 0.00–1.49)
Prothrombin Time: 22.4 seconds — ABNORMAL HIGH (ref 11.6–15.2)

## 2015-05-14 MED ORDER — WARFARIN SODIUM 10 MG PO TABS: 10.0000 mg | ORAL_TABLET | Freq: Every day | ORAL | Status: DC

## 2015-05-14 NOTE — Patient Instructions (Signed)
To lab today  Continue coumadin 10 mg daily [facility] in [facility] months: continue to check PT/INR  Every 2 months, next due 07/09/15

## 2015-05-14 NOTE — Progress Notes (Signed)
Patient ID: [name] [name], male   DOB: 10-04-1960, 54 y.o.   MRN: [id number] Hematology and Oncology Follow Up Visit  GAGANDEEP PETTET [id number] 1960-12-10 54 y.o. 05/14/2015 3:18 PM   Principle Diagnosis: Encounter Diagnoses  Name Primary?  . Other chronic pulmonary embolism ([place])   . Benign essential HTN   . Hyperlipidemia, mixed   . Polycythemia secondary to smoking   . Sinus bradycardia, chronic   . Chronic anticoagulation Yes  . Chronic deep vein thrombosis (DVT) of proximal vein of right lower extremity ([place])   . Primary hypercoagulable state ([place])   . Aneurysm artery, popliteal (H[facility])   Clinical Summary: 54 year old man on chronic Coumadin anticoagulation for recurrent idiopathic pulmonary emboli. He had pulmonary emboli in December 1997, January 1998, October 1998, and in December 1999. He had an episode of superficial phlebitis right lesser saphenous vein in July 2007 when Coumadin level was subtherapeutic. He had a second episode of extensive superficial thrombophlebitis of the left lower extremity in October of 2013. Coumadin temporarily discontinued at that time and he was put on therapeutic doses of lovenox. He developed a deep venous thrombosis in the right femoral and popliteal veins on 05/24/2013. INR was subtherapeutic at 1.6. He was started on Lovenox and his Coumadin dose was increased.  He developed recurrent mid right calf pain on 06/18/2014. There was no objective tenderness on exam. INR was therapeutic at 2.3. D-dimer normal at 0.34. I did go ahead and get Doppler studies which unexpectedly showed a thrombosed right popliteal artery aneurysm. This aneurysm had been seen but was not thrombosed on a November 2014 study. He was evaluated by vascular surgery Dr. Doren Custard. 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  Incidentally found was some nonocclusive thrombus in the popliteal vein on the right. Given reasonable arterial flow, he he was not felt to be in need for bypass surgery. He continues to smoke but has cut back to about 20 cigarettes weekly. He has persistent polycythemia which I believe is secondary to his cigarette smoking. We discussed smoking cessation again today (05/14/15). He does not want to try Chantix or a nicotine patch.   Interim History:   He has had no interim medical problems. No recurrent superficial or deep venous thrombosis. He continues on Coumadin 10 mg daily. Fluctuations in his INR due to intermittent noncompliance. He has gained a considerable amount of weight. 30 pounds over the last 3 years and 12 pounds over the last 1 year. He drinks about one case of beer per week(24 cans). He is still dating a Marine scientist who works here at [employer] for the last 7 years. He is divorced.  Medications: reviewed  Allergies: No Known Allergies  Review of Systems: Hematology:  No bleeding or bruising ENT ROS:  Breast ROS:  Respiratory ROS: No cough or dyspnea Cardiovascular ROS: Denies chest pain or palpitations   Gastrointestinal ROS: No change in bowel habit   Genito-Urinary ROS: No difficulty with urination  Musculoskeletal ROS: No musculoskeletal complaints Neurological ROS: No headache or change in vision Dermatological ROS: No rash or ecchymosis Remaining ROS negative:   Physical Exam: Blood pressure 152/87, pulse 72, temperature 97.5 F (36.4 C), temperature source Oral, height 6\' 3"  (1.905 m), weight 283 lb 14.4 oz (128.776 kg), SpO2 96 %. Wt Readings from Last 3 Encounters:  05/14/15 283  lb 14.4 oz (128.776 kg)  10/01/14 282 lb 1.6 oz (127.96 kg)  06/20/14 271 lb 3.2 oz (123.016 kg)     General appearance: Well-nourished Caucasian man with a chronically erythematous face. HENNT: Pharynx no erythema, exudate, mass, or ulcer. No thyromegaly or thyroid nodules Lymph nodes: No  cervical, supraclavicular, or axillary lymphadenopathy Breasts:  Lungs: Clear to auscultation, resonant to percussion throughout Heart: Regular rhythm, no murmur, no gallop, no rub, no click, no edema Abdomen: Soft, nontender, normal bowel sounds, no mass, no organomegaly Extremities: No edema, no calf tenderness Musculoskeletal: no joint deformities GU:  Vascular: Carotid pulses 2+, no bruits,  Neurologic: Alert, oriented, PERRLA, optic discs sharp and vessels normal, no hemorrhage or exudate, cranial nerves grossly normal, motor strength 5 over 5, reflexes 1+ symmetric upper extremities, absent symmetric lower extremities,, upper body coordination normal, gait normal, Skin: No rash or ecchymosis  Lab Results: CBC W/Diff    Component Value Date/Time   WBC 5.2 09/24/2014 1413   WBC 5.2 05/21/2014 1228   WBC 8.1 10/15/2011 0224   WBC 5.2 09/20/2009 1205   RBC 5.65 09/24/2014 1413   RBC 5.60 05/21/2014 1228   RBC 5.48 10/15/2011 0224   RBC 5.59 09/20/2009 1205   HGB 17.7* 09/24/2014 1413   HGB 17.8* 05/21/2014 1228   HGB 17.6 10/15/2011 0224   HGB 17.8* 09/20/2009 1205   HCT 51.1 09/24/2014 1413   HCT 50.9* 05/21/2014 1228   HCT 51.9 10/15/2011 0224   HCT 52.5* 09/20/2009 1205   PLT 204 09/24/2014 1413   PLT 180 05/21/2014 1228   PLT 190 10/15/2011 0224   PLT 162 09/20/2009 1205   MCV 90.4 09/24/2014 1413   MCV 90.9 05/21/2014 1228   MCV 95 10/15/2011 0224   MCV 94 09/20/2009 1205   MCH 31.3 09/24/2014 1413   MCH 31.8 05/21/2014 1228   MCH 32.1 10/15/2011 0224   MCH 31.9 09/20/2009 1205   MCHC 34.6 09/24/2014 1413   MCHC 35.0 05/21/2014 1228   MCHC 33.9 10/15/2011 0224   MCHC 34.0 09/20/2009 1205   RDW 15.0 09/24/2014 1413   RDW 13.6 05/21/2014 1228   RDW 14.2 10/15/2011 0224   RDW 12.6 09/20/2009 1205   LYMPHSABS 1.5 09/24/2014 1413   LYMPHSABS 1.8 05/21/2014 1228   LYMPHSABS 3.3 10/15/2011 0224   LYMPHSABS 1.5 03/22/2009 1236   MONOABS 0.6 09/24/2014 1413    MONOABS 0.6 05/21/2014 1228   MONOABS 0.9* 10/15/2011 0224   EOSABS 0.2 09/24/2014 1413   EOSABS 0.2 05/21/2014 1228   EOSABS 0.2 10/15/2011 0224   EOSABS 0.1 03/22/2009 1236   BASOSABS 0.0 09/24/2014 1413   BASOSABS 0.0 05/21/2014 1228   BASOSABS 0.1 10/15/2011 0224   BASOSABS 0.2 03/22/2009 1236     Chemistry      Component Value Date/Time   NA 138 09/24/2014 1413   NA 141 05/21/2014 1226   NA 142 10/15/2011 0224   NA 139 07/30/2008 1121   K 4.2 09/24/2014 1413   K 4.3 05/21/2014 1226   K 3.9 10/15/2011 0224   K 4.5 07/30/2008 1121   CL 101 09/24/2014 1413   CL 108* 12/28/2012 1405   CL 105 10/15/2011 0224   CL 106 07/30/2008 1121   CO2 28 09/24/2014 1413   CO2 28 05/21/2014 1226   CO2 27 10/15/2011 0224   CO2 27 07/30/2008 1121   BUN 17 09/24/2014 1413   BUN 13.2 05/21/2014 1226   BUN 15 10/15/2011 0224   BUN  13 07/30/2008 1121   CREATININE 1.00 09/24/2014 1413   CREATININE 0.9 05/21/2014 1226   CREATININE 1.09 01/04/2012 1101   CREATININE 1.09 10/15/2011 0224      Component Value Date/Time   CALCIUM 9.4 09/24/2014 1413   CALCIUM 9.3 05/21/2014 1226   CALCIUM 8.9 10/15/2011 0224   CALCIUM 9.0 07/30/2008 1121   ALKPHOS 82 09/24/2014 1413   ALKPHOS 87 01/08/2014 1203   ALKPHOS 81 10/15/2011 0224   ALKPHOS 59 07/30/2008 1121   AST 56* 09/24/2014 1413   AST 50* 01/08/2014 1203   AST 44* 10/15/2011 0224   AST 23 07/30/2008 1121   ALT 88* 09/24/2014 1413   ALT 83* 01/08/2014 1203   ALT 79* 10/15/2011 0224   ALT 22 07/30/2008 1121   BILITOT 0.7 09/24/2014 1413   BILITOT 0.67 01/08/2014 1203   BILITOT 0.4 10/15/2011 0224   BILITOT 0.60 07/30/2008 1121    INR 2.0 and he missed a dose of Coumadin yesterday   Radiological Studies: No results found.  Impression:  #1. 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 History of recurrent DVT and pulmonary emboli secondary to #1  Recurrent events every time he  lets his Coumadin become subtherapeutic. #3. History of superficial thrombophlebitis  #4. Chronic asymptomatic sinus bradycardia  #5. History of transient atrial fibrillation occurring in March, 2013, no recurrence.  #6. Essential hypertension.       Blood pressure remains borderline elevated. I stressed the need for him to lose some weight. #7. Smoker's polycythemia   He continues to smoke. I addressed this again today. #8. Dermatofibroma left forearm  #9. Hyperlipidemia with prominent hypertriglyceridemia  He went  back on omega-3 fish capsules  at time of last visit but he never started. I started him on Lipitor 10 mg daily at time of April 2016 visit but he has not been taking it.. #10. Health maintenance: he still has not had a baseline colonoscopy. #11. Tobacco addiction with no motivation to stop #12. Excessive weight gain. Discussed need for increased exercise and healthy diet with him.  I am checking chemistry profile and lipid profile today.  CC: No care team member to display   Annia Belt, MD 10/25/20163:18 PM

## 2015-05-15 ENCOUNTER — Telehealth: Payer: Self-pay | Admitting: *Deleted

## 2015-05-15 LAB — LIPID PANEL
CHOLESTEROL TOTAL: 202 mg/dL — AB (ref 100–199)
Chol/HDL Ratio: 6.3 ratio units — ABNORMAL HIGH (ref 0.0–5.0)
HDL: 32 mg/dL — ABNORMAL LOW (ref >39)
LDL Calculated: 130 mg/dL — ABNORMAL HIGH (ref 0–99)
Triglycerides: 201 mg/dL — ABNORMAL HIGH (ref 0–149)
VLDL CHOLESTEROL CAL: 40 mg/dL (ref 5–40)

## 2015-05-15 LAB — CBC WITH DIFFERENTIAL/PLATELET
BASOS ABS: 0 10*3/uL (ref 0.0–0.2)
Basos: 0 %
EOS (ABSOLUTE): 0.2 10*3/uL (ref 0.0–0.4)
Eos: 4 %
Hematocrit: 51.1 % — ABNORMAL HIGH (ref 37.5–51.0)
Hemoglobin: 17.6 g/dL (ref 12.6–17.7)
IMMATURE GRANS (ABS): 0 10*3/uL (ref 0.0–0.1)
IMMATURE GRANULOCYTES: 0 %
Lymphocytes Absolute: 1.6 10*3/uL (ref 0.7–3.1)
Lymphs: 30 %
MCH: 30.9 pg (ref 26.6–33.0)
MCHC: 34.4 g/dL (ref 31.5–35.7)
MCV: 90 fL (ref 79–97)
Monocytes Absolute: 0.7 10*3/uL (ref 0.1–0.9)
Monocytes: 12 %
NEUTROS PCT: 54 %
Neutrophils Absolute: 3 10*3/uL (ref 1.4–7.0)
Platelets: 177 10*3/uL (ref 150–379)
RBC: 5.7 x10E6/uL (ref 4.14–5.80)
RDW: 14.6 % (ref 12.3–15.4)
WBC: 5.5 10*3/uL (ref 3.4–10.8)

## 2015-05-15 LAB — COMPREHENSIVE METABOLIC PANEL
ALT: 154 IU/L — AB (ref 0–44)
AST: 103 IU/L — ABNORMAL HIGH (ref 0–40)
Albumin/Globulin Ratio: 1.7 (ref 1.1–2.5)
Albumin: 4.3 g/dL (ref 3.5–5.5)
Alkaline Phosphatase: 84 IU/L (ref 39–117)
BILIRUBIN TOTAL: 0.9 mg/dL (ref 0.0–1.2)
BUN/Creatinine Ratio: 14 (ref 9–20)
BUN: 12 mg/dL (ref 6–24)
CALCIUM: 9.2 mg/dL (ref 8.7–10.2)
CO2: 22 mmol/L (ref 18–29)
Chloride: 101 mmol/L (ref 97–106)
Creatinine, Ser: 0.85 mg/dL (ref 0.76–1.27)
GFR calc non Af Amer: 98 mL/min/{1.73_m2} (ref >59)
GFR, EST AFRICAN AMERICAN: 113 mL/min/{1.73_m2} (ref >59)
GLUCOSE: 87 mg/dL (ref 65–99)
Globulin, Total: 2.6 g/dL (ref 1.5–4.5)
Potassium: 4.4 mmol/L (ref 3.5–5.2)
Sodium: 140 mmol/L (ref 136–144)
TOTAL PROTEIN: 6.9 g/dL (ref 6.0–8.5)

## 2015-05-15 NOTE — Telephone Encounter (Signed)
Call pt: INR 2. Stay on same dose of coumadin per Dr Beryle Beams. Pt called x 2 - no answer ; left message - INR 2 (1.98) and to stay on same dose of Coumadin per Dr Beryle Beams. And to call if he has any questions.

## 2015-05-16 ENCOUNTER — Other Ambulatory Visit: Payer: Self-pay | Admitting: Oncology

## 2015-05-16 DIAGNOSIS — R7989 Other specified abnormal findings of blood chemistry: Secondary | ICD-10-CM

## 2015-05-16 DIAGNOSIS — R945 Abnormal results of liver function studies: Secondary | ICD-10-CM

## 2015-05-17 ENCOUNTER — Telehealth: Payer: Self-pay | Admitting: *Deleted

## 2015-05-17 NOTE — Telephone Encounter (Signed)
-----   Message from Annia Belt, MD sent at 05/16/2015  4:12 PM EDT ----- Call pt: low grade inflammation of liver. Need to check to see if he has been exposed to hepatitis. Is he taking the lipitor, because this could affect the liver tests. Return in 1-2 weeks. I will put in orders. Cholesterol good at 202 and triglycerides have come down from 380 to 201 on the fish oil capsules.

## 2015-05-17 NOTE — Telephone Encounter (Signed)
Pt called - no answer; left message to call me back. 

## 2015-05-17 NOTE — Telephone Encounter (Signed)
Called pt again - no answer; left message "low grade inflammation of liver. Need to check to see if he has been exposed to hepatitis" and if taking Lipitor, which he on his med list, this could affect liver tests.He needs to call back to  Schedule a lab appt in 1 -2 weeks. Also his cholesterol good @ 202 and Triglycerides down from 380 to 201 on the fish oil capsules per Dr Beryle Beams. And to call if he has any questions.

## 2015-05-22 ENCOUNTER — Telehealth: Payer: Self-pay | Admitting: *Deleted

## 2015-05-22 NOTE — Telephone Encounter (Signed)
Received message from pt - he would like to schedule lab appt on the 14th. Called pt back - no answer; left message appt on the 14th @ 1130AM and [facility] is closed for lunch. And to call back if this time is not convenient.

## 2015-05-22 NOTE — Telephone Encounter (Signed)
F/U call - no answer; left message to call me back if he had received my prior message and need f/u labs in 1-2 weeks ( no appt has been scheduled per [facility]).

## 2015-06-03 ENCOUNTER — Other Ambulatory Visit (INDEPENDENT_AMBULATORY_CARE_PROVIDER_SITE_OTHER): Payer: Self-pay

## 2015-06-03 DIAGNOSIS — R945 Abnormal results of liver function studies: Secondary | ICD-10-CM

## 2015-06-03 DIAGNOSIS — E782 Mixed hyperlipidemia: Secondary | ICD-10-CM

## 2015-06-03 DIAGNOSIS — R7989 Other specified abnormal findings of blood chemistry: Secondary | ICD-10-CM

## 2015-06-03 DIAGNOSIS — I10 Essential (primary) hypertension: Secondary | ICD-10-CM

## 2015-06-04 LAB — HEPATITIS PANEL, ACUTE
HEP A IGM: NEGATIVE
Hep B C IgM: NEGATIVE
Hep C Virus Ab: <0.1 s/co ratio (ref 0.0–0.9)
Hepatitis B Surface Ag: NEGATIVE

## 2015-06-04 LAB — HEMOGLOBIN A1C
Est. average glucose Bld gHb Est-mCnc: 120 mg/dL
Hgb A1c MFr Bld: 5.8 % — ABNORMAL HIGH (ref 4.8–5.6)

## 2015-06-05 ENCOUNTER — Telehealth: Payer: Self-pay | Admitting: *Deleted

## 2015-06-05 NOTE — Telephone Encounter (Signed)
Pt called - no answer; left message Hepatitis A,B, and C negative per Dr Beryle Beams. And to call if he has any questions.

## 2015-06-05 NOTE — Telephone Encounter (Signed)
-----   Message from Annia Belt, MD sent at 06/04/2015  3:21 PM EST ----- Call pt: hepatitis A,B,C negative

## 2016-02-09 ENCOUNTER — Other Ambulatory Visit: Payer: Self-pay | Admitting: Oncology

## 2016-05-08 ENCOUNTER — Other Ambulatory Visit: Payer: Self-pay | Admitting: Oncology

## 2016-11-11 ENCOUNTER — Other Ambulatory Visit: Payer: Self-pay

## 2016-11-11 NOTE — Telephone Encounter (Signed)
Please call him - he needs to come in for a coumadin check now & schedule a follow up appt with me or I will not refill his coumadin.

## 2016-11-11 NOTE — Telephone Encounter (Signed)
Called pt to schedule appts for coumadin check and with Dr Beryle Beams; no answer - left message to give [place] a call back.

## 2016-11-12 NOTE — Telephone Encounter (Signed)
Noted.  Thanks drG

## 2017-01-31 ENCOUNTER — Other Ambulatory Visit: Payer: Self-pay | Admitting: Oncology

## 2017-02-03 NOTE — Telephone Encounter (Signed)
Called pt - no answer; left message to call back and schedule an appt w/ Dr Beryle Beams, who has not seen pt in 1.5 yr.

## 2017-02-11 ENCOUNTER — Other Ambulatory Visit: Payer: Self-pay | Admitting: Oncology

## 2017-03-16 ENCOUNTER — Encounter: Payer: Self-pay | Admitting: Oncology

## 2017-04-13 ENCOUNTER — Encounter (INDEPENDENT_AMBULATORY_CARE_PROVIDER_SITE_OTHER): Payer: Self-pay

## 2017-04-13 ENCOUNTER — Ambulatory Visit (INDEPENDENT_AMBULATORY_CARE_PROVIDER_SITE_OTHER): Payer: Self-pay | Admitting: Oncology

## 2017-04-13 ENCOUNTER — Encounter: Payer: Self-pay | Admitting: Oncology

## 2017-04-13 VITALS — BP 144/98 | HR 78 | Temp 97.5°F | Ht 75.0 in | Wt 266.8 lb

## 2017-04-13 DIAGNOSIS — I825Y1 Chronic embolism and thrombosis of unspecified deep veins of right proximal lower extremity: Secondary | ICD-10-CM

## 2017-04-13 DIAGNOSIS — F17210 Nicotine dependence, cigarettes, uncomplicated: Secondary | ICD-10-CM

## 2017-04-13 DIAGNOSIS — E781 Pure hyperglyceridemia: Secondary | ICD-10-CM

## 2017-04-13 DIAGNOSIS — Z8679 Personal history of other diseases of the circulatory system: Secondary | ICD-10-CM

## 2017-04-13 DIAGNOSIS — I724 Aneurysm of artery of lower extremity: Secondary | ICD-10-CM

## 2017-04-13 DIAGNOSIS — Z8672 Personal history of thrombophlebitis: Secondary | ICD-10-CM

## 2017-04-13 DIAGNOSIS — Z9114 Patient's other noncompliance with medication regimen: Secondary | ICD-10-CM

## 2017-04-13 DIAGNOSIS — Z6833 Body mass index (BMI) 33.0-33.9, adult: Secondary | ICD-10-CM

## 2017-04-13 DIAGNOSIS — E782 Mixed hyperlipidemia: Secondary | ICD-10-CM

## 2017-04-13 DIAGNOSIS — D751 Secondary polycythemia: Secondary | ICD-10-CM

## 2017-04-13 DIAGNOSIS — I2782 Chronic pulmonary embolism: Secondary | ICD-10-CM

## 2017-04-13 DIAGNOSIS — I10 Essential (primary) hypertension: Secondary | ICD-10-CM

## 2017-04-13 DIAGNOSIS — Z79899 Other long term (current) drug therapy: Secondary | ICD-10-CM

## 2017-04-13 DIAGNOSIS — E663 Overweight: Secondary | ICD-10-CM

## 2017-04-13 DIAGNOSIS — D6859 Other primary thrombophilia: Secondary | ICD-10-CM

## 2017-04-13 DIAGNOSIS — R001 Bradycardia, unspecified: Secondary | ICD-10-CM

## 2017-04-13 DIAGNOSIS — R350 Frequency of micturition: Secondary | ICD-10-CM

## 2017-04-13 DIAGNOSIS — Z597 Insufficient social insurance and welfare support: Secondary | ICD-10-CM

## 2017-04-13 DIAGNOSIS — D2362 Other benign neoplasm of skin of left upper limb, including shoulder: Secondary | ICD-10-CM

## 2017-04-13 DIAGNOSIS — Z7901 Long term (current) use of anticoagulants: Secondary | ICD-10-CM

## 2017-04-13 LAB — COMPREHENSIVE METABOLIC PANEL
ALK PHOS: 69 U/L (ref 38–126)
ALT: 47 U/L (ref 17–63)
AST: 41 U/L (ref 15–41)
Albumin: 3.8 g/dL (ref 3.5–5.0)
Anion gap: 8 (ref 5–15)
BUN: 15 mg/dL (ref 6–20)
CALCIUM: 9.2 mg/dL (ref 8.9–10.3)
CHLORIDE: 105 mmol/L (ref 101–111)
CO2: 24 mmol/L (ref 22–32)
CREATININE: 1.08 mg/dL (ref 0.61–1.24)
GFR calc non Af Amer: >60 mL/min (ref >60)
Glucose, Bld: 93 mg/dL (ref 65–99)
Potassium: 3.8 mmol/L (ref 3.5–5.1)
SODIUM: 137 mmol/L (ref 135–145)
Total Bilirubin: 0.9 mg/dL (ref 0.3–1.2)
Total Protein: 6.9 g/dL (ref 6.5–8.1)

## 2017-04-13 LAB — CBC WITH DIFFERENTIAL/PLATELET
BASOS ABS: 0 10*3/uL (ref 0.0–0.1)
Basophils Relative: 0 %
Eosinophils Absolute: 0.3 10*3/uL (ref 0.0–0.7)
Eosinophils Relative: 3 %
HCT: 52.1 % — ABNORMAL HIGH (ref 39.0–52.0)
HEMOGLOBIN: 18.1 g/dL — AB (ref 13.0–17.0)
LYMPHS ABS: 2.8 10*3/uL (ref 0.7–4.0)
Lymphocytes Relative: 34 %
MCH: 32.7 pg (ref 26.0–34.0)
MCHC: 34.7 g/dL (ref 30.0–36.0)
MCV: 94.2 fL (ref 78.0–100.0)
MONO ABS: 1 10*3/uL (ref 0.1–1.0)
Monocytes Relative: 12 %
NEUTROS PCT: 51 %
Neutro Abs: 4.2 10*3/uL (ref 1.7–7.7)
PLATELETS: 177 10*3/uL (ref 150–400)
RBC: 5.53 MIL/uL (ref 4.22–5.81)
RDW: 13.7 % (ref 11.5–15.5)
WBC: 8.3 10*3/uL (ref 4.0–10.5)

## 2017-04-13 LAB — LIPID PANEL
CHOLESTEROL: 191 mg/dL (ref 0–200)
HDL: 34 mg/dL — ABNORMAL LOW (ref >40)
LDL Cholesterol: 94 mg/dL (ref 0–99)
TRIGLYCERIDES: 316 mg/dL — AB (ref <150)
Total CHOL/HDL Ratio: 5.6 RATIO
VLDL: 63 mg/dL — AB (ref 0–40)

## 2017-04-13 LAB — PROTIME-INR
INR: 2.37
Prothrombin Time: 25.7 seconds — ABNORMAL HIGH (ref 11.4–15.2)

## 2017-04-13 MED ORDER — TRIAMTERENE-HCTZ 37.5-25 MG PO CAPS: 1.0000 | ORAL_CAPSULE | Freq: Every day | ORAL | 11 refills | Status: DC

## 2017-04-13 MED ORDER — AMLODIPINE BESYLATE 10 MG PO TABS: 10.0000 mg | ORAL_TABLET | Freq: Every day | ORAL | 11 refills | Status: DC

## 2017-04-13 MED ORDER — WARFARIN SODIUM 10 MG PO TABS: 10.0000 mg | ORAL_TABLET | Freq: Every day | ORAL | 3 refills | Status: DC

## 2017-04-13 MED ORDER — AMOXICILLIN-POT CLAVULANATE 875-125 MG PO TABS: 1.0000 | ORAL_TABLET | Freq: Two times a day (BID) | ORAL | 0 refills | Status: AC

## 2017-04-13 MED ORDER — ATORVASTATIN CALCIUM 10 MG PO TABS: 10.0000 mg | ORAL_TABLET | Freq: Every day | ORAL | 11 refills | Status: DC

## 2017-04-13 NOTE — Progress Notes (Signed)
Hematology and Oncology Follow Up Visit  [name] [name] [id number] [birth year] 56 y.o. 04/13/2017 2:22 PM   Principle Diagnosis: Encounter Diagnoses  Name Primary?  . Polycythemia secondary to smoking   . Other chronic pulmonary embolism without acute cor pulmonale ([place])   . Chronic anticoagulation   . Chronic deep vein thrombosis (DVT) of proximal vein of right lower extremity ([place])   . Primary hypercoagulable state ([place]) Yes  . Hyperlipidemia, mixed   Clinical summary:  56 year old poorly compliant man on chronic Coumadin anticoagulation for recurrent idiopathic pulmonary emboli. He had pulmonary emboli in December 1997, January 1998, October 1998, and in December 1999. He had an episode of superficial phlebitis right lesser saphenous vein in July 2007 when Coumadin level was subtherapeutic. He had a second episode of extensive superficial thrombophlebitis of the left lower extremity in October of 2013. Coumadin temporarily discontinued at that time and he was put on therapeutic doses of lovenox. He developed a deep venous thrombosis in the right femoral and popliteal veins on 05/24/2013. INR was subtherapeutic at 1.6. He was started on Lovenox and his Coumadin dose was increased.  He developed recurrent mid right calf pain on 06/18/2014. There was no objective tenderness on exam. INR was therapeutic at 2.3. D-dimer normal at 0.34. I did go ahead and get Doppler studies which unexpectedly showed a thrombosed right popliteal artery aneurysm. This aneurysm had been seen but was not thrombosed on a November 2014 study. He was evaluated by vascular surgery Dr. Doren Custard. 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 Incidentally found was some nonocclusive thrombus in the popliteal vein on the right. Given reasonable arterial flow, he he was not felt  to be in need for bypass surgery. He continues to smoke but has cut back to 1-2 packs weekly. He has persistent polycythemia which I believe is secondary to his cigarette smoking.   Interim history: He has not kept in office visit here in 2 years. He has not had his PT INR check in that amount of time either. I refused to refill his Coumadin until he committed to a follow-up appointment. He stopped taking his diuretic and his statin. He was taking 5 mg of amlodipine. He typically holds his Coumadin on the weekends if he drinks a lot of be her. He is still drinking about 24 cans a week. He is still smoking 1-2 packs per week. He tells me about a month ago he was eating oysters at his daughter's birthday party and shortly after that developed a not a clot in his leg. He had some Lovenox at home and took that for a few days. He looked up on the Internet and found a link between blood clots and oysters which I think is likely untrue. Blood pressure borderline elevated today likely from noncompliance but I am going to increase his amlodipine to 10 mg daily. He has had a bad bronchitis with a hacking productive cough for the last 2 weeks. He has been coughing so hard it takes his breath away. He didn't take his temperature but feels sweaty. He denies any chest pain or palpitations. No headache or change in vision. He does report urinary frequency.  Medications: reviewed  Allergies: No Known Allergies  Review of Systems:  Remaining ROS negative:   Physical Exam: Blood pressure (!) 144/98, pulse 78, temperature (!)  97.5 F (36.4 C), temperature source Oral, height 6\' 3"  (1.905 m), weight 266 lb 12.8 oz (121 kg), SpO2 98 %. Wt Readings from Last 3 Encounters:  04/13/17 266 lb 12.8 oz (121 kg)  05/14/15 283 lb 14.4 oz (128.8 kg)  10/01/14 282 lb 1.6 oz (128 kg)     General appearance: Overweight Caucasian man. Plethoric face. HENNT: Pharynx no erythema, exudate, mass, or ulcer. No thyromegaly or  thyroid nodules Lymph nodes: No cervical, supraclavicular, or axillary lymphadenopathy Breasts:  Lungs: Clear to auscultation, resonant to percussion throughout Heart: Regular rhythm, no murmur, no gallop, no rub, no click, no edema Abdomen: Soft, nontender, normal bowel sounds, no mass, no organomegaly Extremities: No edema, no calf tenderness Musculoskeletal: no joint deformities GU:  Vascular: Carotid pulses 2+, no bruits,  Neurologic: Alert, oriented, PERRLA, optic discs sharp and vessels normal, no hemorrhage or exudate, cranial nerves grossly normal, motor strength 5 over 5, reflexes 1+ symmetric, upper body coordination normal, gait normal, Skin: No rash or ecchymosis  Lab Results: CBC W/Diff    Component Value Date/Time   WBC 8.3 04/13/2017 0948   RBC 5.53 04/13/2017 0948   HGB 18.1 (H) 04/13/2017 0948   HGB 17.6 05/14/2015 1159   HGB 17.8 (H) 05/21/2014 1228   HCT 52.1 (H) 04/13/2017 0948   HCT 51.1 (H) 05/14/2015 1159   HCT 50.9 (H) 05/21/2014 1228   PLT 177 04/13/2017 0948   PLT 177 05/14/2015 1159   MCV 94.2 04/13/2017 0948   MCV 90 05/14/2015 1159   MCV 90.9 05/21/2014 1228   MCH 32.7 04/13/2017 0948   MCHC 34.7 04/13/2017 0948   RDW 13.7 04/13/2017 0948   RDW 14.6 05/14/2015 1159   RDW 13.6 05/21/2014 1228   LYMPHSABS 2.8 04/13/2017 0948   LYMPHSABS 1.6 05/14/2015 1159   LYMPHSABS 1.8 05/21/2014 1228   MONOABS 1.0 04/13/2017 0948   MONOABS 0.6 05/21/2014 1228   EOSABS 0.3 04/13/2017 0948   EOSABS 0.2 05/14/2015 1159   EOSABS 0.2 10/15/2011 0224   EOSABS 0.1 03/22/2009 1236   BASOSABS 0.0 04/13/2017 0948   BASOSABS 0.0 05/14/2015 1159   BASOSABS 0.0 05/21/2014 1228     Chemistry      Component Value Date/Time   NA 137 04/13/2017 0955   NA 140 05/14/2015 1159   NA 141 05/21/2014 1226   K 3.8 04/13/2017 0955   K 4.3 05/21/2014 1226   CL 105 04/13/2017 0955   CL 108 (H) 12/28/2012 1405   CO2 24 04/13/2017 0955   CO2 28 05/21/2014 1226   BUN 15  04/13/2017 0955   BUN 12 05/14/2015 1159   BUN 13.2 05/21/2014 1226   CREATININE 1.08 04/13/2017 0955   CREATININE 1.00 09/24/2014 1413   CREATININE 0.9 05/21/2014 1226      Component Value Date/Time   CALCIUM 9.2 04/13/2017 0955   CALCIUM 9.3 05/21/2014 1226   ALKPHOS 69 04/13/2017 0955   ALKPHOS 87 01/08/2014 1203   AST 41 04/13/2017 0955   AST 50 (H) 01/08/2014 1203   ALT 47 04/13/2017 0955   ALT 83 (H) 01/08/2014 1203   BILITOT 0.9 04/13/2017 0955   BILITOT 0.9 05/14/2015 1159   BILITOT 0.67 01/08/2014 1203    Cholesterol 191. LDL 94. Ratio 5.6. Triglycerides 316.   Radiological Studies: No results found.  Impression:  #1. 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 I had him meet with one of our pharmacist to look into getting a home  monitor given his poor compliance with lab testing. We discussed changing to one of the oral Xa inhibitors. He has no insurance. He would prefer to stay on the Coumadin. #2. History of recurrent DVT and pulmonary emboli secondary to #1  Recurrent events every time he lets his Coumadin become subtherapeutic. #3. History of superficial thrombophlebitis  #4. Chronic asymptomatic sinus bradycardia  #5. History of transient atrial fibrillation occurring in March, 2013, no recurrence.  #6. Essential hypertension.       Blood pressure remains borderline elevated. This is partially due to noncompliance. I refilled all his medications again with 11 refills and encouraged him to go back on his diuretic. I'm increasing his amlodipine to 10 mg daily. #7. Smoker's polycythemia   He continues to smoke. I addressed this again today. #8. Dermatofibroma left forearm  #9. Hyperlipidemia with prominent hypertriglyceridemia  He is still using omega-3 fish capsules but triglycerides still elevated  . I started him on Lipitor 10 mg daily at time of April 2016 visit but he has not been taking it.. We  discussed this again today and I encouraged him to fill the prescription I just sent in. #10. Health maintenance: he still has not had a baseline colonoscopy. #11. Tobacco addiction with no motivation to stop #12. Excessive weight gain. I read him the riot act again today. He needs to take more responsibility for his health or he is going to wind up with a heart attack and back in the hospital.  More than 40 minutes spent with visit exam and counseling  CC: No care team member to display   Murriel Hopper, MD, [place]  Hematology-Oncology/Internal Medicine     9/25/20182:22 PM

## 2017-04-13 NOTE — Patient Instructions (Addendum)
Increase your Amlodipine to 10 mg daily Go back on your water pill Continue warfarin at same dose Go back on lipitor (Atorvastatin) Take Augmentin twice daily for 7 days for your bronchitis Continue lab checks every 2 months for warfarin  PT/INR next on Nov 26

## 2017-04-14 ENCOUNTER — Telehealth: Payer: Self-pay | Admitting: *Deleted

## 2017-04-14 NOTE — Telephone Encounter (Signed)
-----   Message from Annia Belt, MD sent at 04/13/2017  5:50 PM EDT ----- Call pt: INR good at 2.4; cholesterol OK at 191 but triglycerides still up at 316; everything else OK

## 2017-04-14 NOTE — Telephone Encounter (Signed)
Pt called - no answer; left message "INR good at 2.4 (2.37); cholesterol OK at 191 but triglycerides still up at 316; everything else OK " per Dr Beryle Beams. And call for any questions.

## 2017-05-03 ENCOUNTER — Other Ambulatory Visit (INDEPENDENT_AMBULATORY_CARE_PROVIDER_SITE_OTHER): Payer: Self-pay

## 2017-05-03 ENCOUNTER — Other Ambulatory Visit: Payer: Self-pay | Admitting: Oncology

## 2017-05-03 ENCOUNTER — Telehealth: Payer: Self-pay | Admitting: *Deleted

## 2017-05-03 DIAGNOSIS — I825Y1 Chronic embolism and thrombosis of unspecified deep veins of right proximal lower extremity: Secondary | ICD-10-CM

## 2017-05-03 DIAGNOSIS — Z7901 Long term (current) use of anticoagulants: Secondary | ICD-10-CM

## 2017-05-03 DIAGNOSIS — D6859 Other primary thrombophilia: Secondary | ICD-10-CM

## 2017-05-03 LAB — CBC WITH DIFFERENTIAL/PLATELET
Basophils Absolute: 0 10*3/uL (ref 0.0–0.1)
Basophils Relative: 0 %
EOS PCT: 3 %
Eosinophils Absolute: 0.2 10*3/uL (ref 0.0–0.7)
HCT: 51.8 % (ref 39.0–52.0)
Hemoglobin: 17.9 g/dL — ABNORMAL HIGH (ref 13.0–17.0)
LYMPHS ABS: 2 10*3/uL (ref 0.7–4.0)
LYMPHS PCT: 25 %
MCH: 32.3 pg (ref 26.0–34.0)
MCHC: 34.6 g/dL (ref 30.0–36.0)
MCV: 93.5 fL (ref 78.0–100.0)
MONO ABS: 1.2 10*3/uL — AB (ref 0.1–1.0)
MONOS PCT: 15 %
Neutro Abs: 4.6 10*3/uL (ref 1.7–7.7)
Neutrophils Relative %: 57 %
PLATELETS: 259 10*3/uL (ref 150–400)
RBC: 5.54 MIL/uL (ref 4.22–5.81)
RDW: 13.2 % (ref 11.5–15.5)
WBC: 8 10*3/uL (ref 4.0–10.5)

## 2017-05-03 LAB — PROTIME-INR
INR: 2.87
PROTHROMBIN TIME: 29.8 s — AB (ref 11.4–15.2)

## 2017-05-03 LAB — D-DIMER, QUANTITATIVE: D-Dimer, Quant: 0.56 ug/mL-FEU — ABNORMAL HIGH (ref 0.00–0.50)

## 2017-05-03 NOTE — Addendum Note (Signed)
Addended by: Truddie Crumble on: 05/03/2017 03:09 PM   Modules accepted: Orders

## 2017-05-03 NOTE — Progress Notes (Signed)
Enoxaparin was reviewed with the patient, including name, instructions, indication, goals of therapy, potential side effects, importance of adherence, and safe use. Patient is to take enoxaparin 120 mg BID.  Patient verbalized understanding by repeating back information and was advised to contact me if further medication-related questions arise. Patient was also provided an information handout.

## 2017-05-03 NOTE — Telephone Encounter (Signed)
Pt calls and states he knows he has a clot in his leg. States he usually goes to [facility] and then gets samples of lovenox. Called dr Beryle Beams, he is putting orders in for labs, doppler and will speak w/ dr Maudie Mercury for lovenox

## 2017-05-03 NOTE — Telephone Encounter (Signed)
Orders in - do doppler here at [facility]

## 2017-05-04 ENCOUNTER — Encounter (HOSPITAL_COMMUNITY): Payer: Self-pay

## 2017-05-04 NOTE — Telephone Encounter (Signed)
Called patient to offer appointment for today 05/04/2017 no answer.  LMOM.  Scheduled patient for tomorrow @ 1pm.  Message was LOM for patient to contact our office if any questions or concerns.

## 2017-05-04 NOTE — Telephone Encounter (Signed)
Sending to chilonb.

## 2017-05-05 ENCOUNTER — Ambulatory Visit (HOSPITAL_COMMUNITY): Admission: RE | Admit: 2017-05-05 | Payer: Self-pay | Source: Ambulatory Visit

## 2017-05-07 ENCOUNTER — Ambulatory Visit (HOSPITAL_COMMUNITY)
Admission: RE | Admit: 2017-05-07 | Discharge: 2017-05-07 | Disposition: A | Discharge status: Home / Self Care | Payer: Self-pay | Source: Ambulatory Visit | Attending: Oncology | Admitting: Oncology

## 2017-05-07 ENCOUNTER — Telehealth: Payer: Self-pay | Admitting: Internal Medicine

## 2017-05-07 DIAGNOSIS — D6859 Other primary thrombophilia: Secondary | ICD-10-CM

## 2017-05-07 DIAGNOSIS — Z7901 Long term (current) use of anticoagulants: Secondary | ICD-10-CM

## 2017-05-07 DIAGNOSIS — I825Y1 Chronic embolism and thrombosis of unspecified deep veins of right proximal lower extremity: Secondary | ICD-10-CM

## 2017-05-07 NOTE — Telephone Encounter (Signed)
Spoke with vascular lab.  [name] [name] presented today for Ultrasound.  No DVT was found, but patient with superficial femoral ARTERY clot.  Patient reports compliance with coumadin and lovenox and reports that pain is improving on Lovenox.  I asked that our clinic all him to be seen on Monday for evaluation and INR check.  If any changes, worsening of pain, etc, he should seek emergency care over the week.  Vascular lab tech relayed message and clinic nurse will call to schedule in [facility].

## 2017-05-07 NOTE — Progress Notes (Signed)
Preliminary results by tech - Right Lower Ext. Venous Duplex Completed. Negative for acute deep vein thrombosis in the right leg. Positive for a superficial vein thrombus in the proximal great saphenous vein.  Incidental findings - There is evidence of acute thrombus throughout the superficial femoral artery. A thrombosed popliteal aneurysm was noted, which is known. Results given to Dr. Daryll Drown. Patient left with instructions.  Oda Cogan, BS, RDMS, RVT

## 2017-05-07 NOTE — Telephone Encounter (Signed)
Called pt and scheduled appt mon [facility] at 1430 per dr Daryll Drown

## 2017-05-10 ENCOUNTER — Ambulatory Visit (INDEPENDENT_AMBULATORY_CARE_PROVIDER_SITE_OTHER): Payer: Self-pay | Admitting: Internal Medicine

## 2017-05-10 VITALS — BP 127/68 | HR 72 | Temp 97.9°F | Ht 75.0 in | Wt 265.6 lb

## 2017-05-10 DIAGNOSIS — I2782 Chronic pulmonary embolism: Secondary | ICD-10-CM

## 2017-05-10 DIAGNOSIS — Z7901 Long term (current) use of anticoagulants: Secondary | ICD-10-CM

## 2017-05-10 DIAGNOSIS — I82811 Embolism and thrombosis of superficial veins of right lower extremities: Secondary | ICD-10-CM

## 2017-05-10 DIAGNOSIS — R202 Paresthesia of skin: Secondary | ICD-10-CM

## 2017-05-10 MED ORDER — ENOXAPARIN SODIUM 30 MG/0.3ML ~~LOC~~ SOLN: SUBCUTANEOUS | 0 refills | Status: DC

## 2017-05-10 NOTE — Patient Instructions (Signed)
It was a pleasure to see you Mr. [name] [name].  I am glad your pain is improving.  We are stopping your Coumadin and continuing Lovenox for anticoagulation.  Take the Lovenox 180 mg total (120 mg shot plus 60 mg shot) together once a day.  I am referring you to the vascular and vein doctors for further evaluation.  Please follow up with Dr. Beryle Beams in about 1 week.

## 2017-05-10 NOTE — Progress Notes (Signed)
CC: RLE blood clot  HPI:  [name] is a 56 y.o. 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  About 1 week ago, patient began to have right sided inner thigh pain. He initially thought it was a muscle strain, but his pain worsened and he had difficulty ambulating. He was started on Lovenox 120 mg BID and a Doppler U/S of his RLE was obtained. This showed an acute thrombus throughout the superficial femoral artery, indeterminate age superficial vein thrombosis of the right greater saphenous vein, and no evidence of DVT.  Patient reports taking his Lovenox 120 mg every 18 hours regularly in addition to Coumadin. He says his pain is much improved since starting Lovenox. He denies any obvious bleeding, chest pain, hemoptysis, shortness of breath, change in sensation, or current pain in his legs or feet.  Past Medical History:  Diagnosis Date  . 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 Benign essential HTN 01/04/2012  . Chronic anticoagulation 01/02/2013  . Dermatofibroma of forearm 01/02/2013   Left side  . Hyperlipidemia, mixed 01/04/2012  . 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 Primary hypercoagulable state ([place]) 10/01/2014  . Sinus bradycardia, chronic 01/04/2012  . Superficial thrombosis of lower extremity 05/02/2012   Review of Systems:   Review of Systems  HENT: Negative for nosebleeds.   Respiratory: Negative for cough, hemoptysis and shortness of breath.   Cardiovascular: Negative for chest pain and palpitations.       Improved right thigh swelling  Gastrointestinal: Negative for blood in stool and melena.  Genitourinary: Negative for hematuria.  Musculoskeletal: Negative for falls.  Neurological: Positive for tingling. Negative for dizziness, sensory  change and loss of consciousness.     Physical Exam:  Vitals:   05/10/17 1457  BP: 127/68  Pulse: 72  Temp: 97.9 F (36.6 C)  TempSrc: Oral  SpO2: 100%  Weight: 265 lb 9.6 oz (120.5 kg)  Height: 6\' 3"  (1.905 m)   Physical Exam  Constitutional: He is oriented to person, place, and time. He appears well-developed and well-nourished. No distress.  Cardiovascular: Normal rate and regular rhythm.   No murmur heard. Right foot: no DP pulses by palpation or doppler, PT pulse present by doppler. Left foot: DP pulse +2 by hand, PT +1  Pulmonary/Chest: Effort normal. No respiratory distress. He has no wheezes. He has no rales.  Abdominal: Soft. He exhibits no distension. There is no tenderness.  Slight bruising from Lovenox injections  Musculoskeletal: Normal range of motion. He exhibits no edema or tenderness.  Neurological: He is alert and oriented to person, place, and time. No sensory deficit.  Skin: Skin is warm. He is not diaphoretic.  Prominent veins distal feet without varicosities. Skin is warm. Dusky appearance of toes bilaterally. No open wounds or necrotic appearance of toes.    Assessment & Plan:   See Encounters Tab for problem based charting.  Patient seen with [name] [name]  Superficial thrombosis of lower extremity Patient with a history of idiopathic hypercoagulopathy (PEs, DVTs) found to have an acute superficial femoral artery thrombus of his right leg. He has responded well to Lovenox so far in addition to his Coumadin. He has palpable DP/PT pulses in his left foot, however I cannot palpate these pulses in his right foot. PT pulse is present by  doppler, however I could not appreciate a DP pulse by doppler on his right foot. Both feet are warm without sign of necrosis. We will adjust his medications so he is not on two anticoagulation agents and increased risk of bleeding. He will follow up with [name] [name] in about 1 week. - d/c Coumadin - Start Lovenox 180 mg  every 24 hours [patient given samples including 120 mg syringes (#10) and 60 mg syringes (#8)] - Refer to vascular surgery - f/u with [name] [name] in 1 week

## 2017-05-10 NOTE — Assessment & Plan Note (Addendum)
Patient with a history of idiopathic hypercoagulopathy (PEs, DVTs) found to have an acute superficial femoral artery thrombus of his right leg. He has responded well to Lovenox so far in addition to his Coumadin. He has palpable DP/PT pulses in his left foot, however I cannot palpate these pulses in his right foot. PT pulse is present by doppler, however I could not appreciate a DP pulse by doppler on his right foot. Both feet are warm without sign of necrosis. We will adjust his medications so he is not on two anticoagulation agents and increased risk of bleeding. He will follow up with Dr. Beryle Beams in about 1 week. - d/c Coumadin - Start Lovenox 180 mg every 24 hours [patient given samples including 120 mg syringes (#10) and 60 mg syringes (#8)] - Refer to vascular surgery - f/u with Dr. Beryle Beams in 1 week

## 2017-05-12 NOTE — Progress Notes (Signed)
Internal Medicine Clinic Attending  I saw and evaluated the patient.  I personally confirmed the key portions of the history and exam documented by Dr. Patel and I reviewed pertinent patient test results.  The assessment, diagnosis, and plan were formulated together and I agree with the documentation in the resident's note.  

## 2017-05-25 ENCOUNTER — Other Ambulatory Visit: Payer: Self-pay

## 2017-05-25 ENCOUNTER — Ambulatory Visit (INDEPENDENT_AMBULATORY_CARE_PROVIDER_SITE_OTHER): Payer: Self-pay | Admitting: Oncology

## 2017-05-25 ENCOUNTER — Encounter: Payer: Self-pay | Admitting: Oncology

## 2017-05-25 ENCOUNTER — Encounter (INDEPENDENT_AMBULATORY_CARE_PROVIDER_SITE_OTHER): Payer: Self-pay

## 2017-05-25 VITALS — BP 121/90 | HR 94 | Temp 98.1°F | Ht 75.0 in | Wt 263.1 lb

## 2017-05-25 DIAGNOSIS — I739 Peripheral vascular disease, unspecified: Secondary | ICD-10-CM

## 2017-05-25 DIAGNOSIS — D751 Secondary polycythemia: Secondary | ICD-10-CM

## 2017-05-25 DIAGNOSIS — Z86718 Personal history of other venous thrombosis and embolism: Secondary | ICD-10-CM

## 2017-05-25 DIAGNOSIS — Z6832 Body mass index (BMI) 32.0-32.9, adult: Secondary | ICD-10-CM

## 2017-05-25 DIAGNOSIS — I743 Embolism and thrombosis of arteries of the lower extremities: Secondary | ICD-10-CM

## 2017-05-25 DIAGNOSIS — Z86711 Personal history of pulmonary embolism: Secondary | ICD-10-CM

## 2017-05-25 DIAGNOSIS — I724 Aneurysm of artery of lower extremity: Secondary | ICD-10-CM

## 2017-05-25 DIAGNOSIS — E663 Overweight: Secondary | ICD-10-CM

## 2017-05-25 DIAGNOSIS — Z7901 Long term (current) use of anticoagulants: Secondary | ICD-10-CM

## 2017-05-25 DIAGNOSIS — D6859 Other primary thrombophilia: Secondary | ICD-10-CM

## 2017-05-25 HISTORY — DX: Embolism and thrombosis of arteries of the lower extremities: I74.3

## 2017-05-25 LAB — BASIC METABOLIC PANEL
Anion gap: 7 (ref 5–15)
BUN: 12 mg/dL (ref 6–20)
CALCIUM: 9.7 mg/dL (ref 8.9–10.3)
CHLORIDE: 104 mmol/L (ref 101–111)
CO2: 28 mmol/L (ref 22–32)
CREATININE: 0.96 mg/dL (ref 0.61–1.24)
GFR calc Af Amer: >60 mL/min (ref >60)
GFR calc non Af Amer: >60 mL/min (ref >60)
GLUCOSE: 90 mg/dL (ref 65–99)
Potassium: 4.9 mmol/L (ref 3.5–5.1)
Sodium: 139 mmol/L (ref 135–145)

## 2017-05-25 LAB — PROTIME-INR
INR: 1.65
PROTHROMBIN TIME: 19.4 s — AB (ref 11.4–15.2)

## 2017-05-25 NOTE — Patient Instructions (Signed)
Continue coumadin Begin aspirin 81 mg daily We will try to move up your appointment with vascular surgery Go to emergency department immediately if foot turns blue, cold, or painful

## 2017-05-26 ENCOUNTER — Encounter: Payer: Self-pay | Admitting: Oncology

## 2017-05-26 NOTE — Progress Notes (Signed)
Hematology and Oncology Follow Up Visit  [name] [name] [id number] [birth year] 56 y.o. 05/26/2017 11:46 AM   Principle Diagnosis: Encounter Diagnoses  Name Primary?  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 Chronic anticoagulation   . Primary hypercoagulable state ([place])    Interim History:  Poorly compliant 56 year old man I have followed for many years.  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  He is on chronic warfarin anticoagulation.  He has secondary polycythemia.  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  This appeared partially thrombosed on a June 18, 2014 study.  He called the office on October 15 2 complaint of acute onset of pain in the right medial thigh.  He attributed this to a recurrent clot.  We had him come in for lab and a vascular Doppler study.  He showed up for the lab.  His INR was 2.9 on Coumadin 10 mg daily.  A d-dimer was mildly elevated at 0.56.  He states he was not informed that he was supposed to have a Doppler study that day and went home with some Lovenox samples.  When I called him that evening to report his warfarin was therapeutic and that he needed to get back for the Doppler study, he did not answer my call.  Fortunately, he was contacted by the ultrasound technician and did have the study done on October 19.  This did not show any recurrent venous thrombosis but it did show acute thrombus throughout the superficial femoral artery.  I was out of town.  My coverage evaluated the patient.  His pain had resolved on the Lovenox.  On exam there was no palpable dorsalis pedis pulse on the right.  A posterior tibial pulse was present by Doppler study.  Normal pulses on the left foot.  Sharee Holster appearance of the skin of the toes bilaterally but foot was warm. He was put back on warfarin and a vascular surgery referral was made.  Medications:  reviewed  Allergies: No Known Allergies  Review of Systems: See interim history Remaining ROS negative:   Physical Exam: Blood pressure 121/90, pulse 94, temperature 98.1 F (36.7 C), temperature source Oral, height 6\' 3"  (1.905 m), weight 263 lb 1.6 oz (119.3 kg), SpO2 98 %. Wt Readings from Last 3 Encounters:  05/25/17 263 lb 1.6 oz (119.3 kg)  05/10/17 265 lb 9.6 oz (120.5 kg)  04/13/17 266 lb 12.8 oz (121 kg)     General appearance: Overweight Caucasian man with chronic facial erythema. HENNT: Pharynx no erythema, exudate, mass, or ulcer. No thyromegaly or thyroid nodules Lymph nodes: No cervical, supraclavicular, or axillary lymphadenopathy Breasts: Lungs: Clear to auscultation, resonant to percussion throughout Heart: Regular rhythm, no murmur, no gallop, no rub, no click, no edema Abdomen: Soft, nontender, normal bowel sounds, no mass, no organomegaly Extremities: No edema, no calf tenderness Musculoskeletal: no joint deformities GU:  Vascular: Carotid pulses 2+, no bruits, distal pulses: Dorsalis pedis 2+ on the left, absent on the right, posterior tibial: 1+ on the left, nonpalpable on the right.  Dependent rubor of the foot.  Intermittent cyanotic changes.  Right foot is only minimally cool to the touch compared to the left. Neurologic: Alert, oriented, PERRLA,  cranial nerves grossly normal, motor strength 5 over 5, reflexes 1+ symmetric, upper body coordination normal, gait normal, Skin: No rash or ecchymosis  Lab Results: CBC W/Diff  Component Value Date/Time   WBC 8.0 05/03/2017 1509   RBC 5.54 05/03/2017 1509   HGB 17.9 (H) 05/03/2017 1509   HGB 17.6 05/14/2015 1159   HGB 17.8 (H) 05/21/2014 1228   HCT 51.8 05/03/2017 1509   HCT 51.1 (H) 05/14/2015 1159   HCT 50.9 (H) 05/21/2014 1228   PLT 259 05/03/2017 1509   PLT 177 05/14/2015 1159   MCV 93.5 05/03/2017 1509   MCV 90 05/14/2015 1159   MCV 90.9 05/21/2014 1228   MCH 32.3 05/03/2017 1509   MCHC 34.6  05/03/2017 1509   RDW 13.2 05/03/2017 1509   RDW 14.6 05/14/2015 1159   RDW 13.6 05/21/2014 1228   LYMPHSABS 2.0 05/03/2017 1509   LYMPHSABS 1.6 05/14/2015 1159   LYMPHSABS 1.8 05/21/2014 1228   MONOABS 1.2 (H) 05/03/2017 1509   MONOABS 0.6 05/21/2014 1228   EOSABS 0.2 05/03/2017 1509   EOSABS 0.2 05/14/2015 1159   EOSABS 0.2 10/15/2011 0224   EOSABS 0.1 03/22/2009 1236   BASOSABS 0.0 05/03/2017 1509   BASOSABS 0.0 05/14/2015 1159   BASOSABS 0.0 05/21/2014 1228     Chemistry      Component Value Date/Time   NA 139 05/25/2017 1641   NA 140 05/14/2015 1159   NA 141 05/21/2014 1226   K 4.9 05/25/2017 1641   K 4.3 05/21/2014 1226   CL 104 05/25/2017 1641   CL 108 (H) 12/28/2012 1405   CO2 28 05/25/2017 1641   CO2 28 05/21/2014 1226   BUN 12 05/25/2017 1641   BUN 12 05/14/2015 1159   BUN 13.2 05/21/2014 1226   CREATININE 0.96 05/25/2017 1641   CREATININE 1.00 09/24/2014 1413   CREATININE 0.9 05/21/2014 1226      Component Value Date/Time   CALCIUM 9.7 05/25/2017 1641   CALCIUM 9.3 05/21/2014 1226   ALKPHOS 69 04/13/2017 0955   ALKPHOS 87 01/08/2014 1203   AST 41 04/13/2017 0955   AST 50 (H) 01/08/2014 1203   ALT 47 04/13/2017 0955   ALT 83 (H) 01/08/2014 1203   BILITOT 0.9 04/13/2017 0955   BILITOT 0.9 05/14/2015 1159   BILITOT 0.67 01/08/2014 1203       Radiological Studies: See discussion above. We did screening ABIs in the office today.  They did not tell us more than we knew already from his physical exam.  Significant peripheral vascular disease on the right.  Impression: Acute arterial thrombosis right superficial femoral artery with history of a partially thrombosed right popliteal artery aneurysm occurring while on therapeutic warfarin. 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  Plan: I am going to try to expedite his vascular surgery evaluation.  He will likely need an angiogram and consideration for  thrombectomy/angioplasty.  The serious and life-threatening nature of arterial emboli reviewed with the patient and the importance of returning phone messages and compliance with prescribed medical therapy reinforced.     CC: Patient Care Team: Patient, No Pcp Per as PCP - General (General Practice)   Murriel Hopper, MD, [place]  Hematology-Oncology/Internal Medicine [phone]    11/7/201811:46 AM

## 2017-05-27 ENCOUNTER — Ambulatory Visit (INDEPENDENT_AMBULATORY_CARE_PROVIDER_SITE_OTHER): Payer: Self-pay | Admitting: Vascular Surgery

## 2017-05-27 ENCOUNTER — Ambulatory Visit (HOSPITAL_COMMUNITY)
Admission: RE | Admit: 2017-05-27 | Discharge: 2017-05-27 | Disposition: A | Discharge status: Home / Self Care | Payer: Self-pay | Source: Ambulatory Visit | Attending: Vascular Surgery | Admitting: Vascular Surgery

## 2017-05-27 ENCOUNTER — Encounter: Payer: Self-pay | Admitting: Vascular Surgery

## 2017-05-27 VITALS — BP 156/111 | HR 94 | Temp 97.2°F | Resp 20 | Ht 75.0 in | Wt 266.0 lb

## 2017-05-27 DIAGNOSIS — I724 Aneurysm of artery of lower extremity: Secondary | ICD-10-CM | POA: Insufficient documentation

## 2017-05-27 DIAGNOSIS — I739 Peripheral vascular disease, unspecified: Secondary | ICD-10-CM

## 2017-05-27 NOTE — Progress Notes (Signed)
Referring Physician: Dr Beryle Beams  Patient name: [name] [name] MRN: [id number] DOB: 1961-02-09 Sex: male  REASON FOR CONSULT: Peripheral arterial disease  HPI: [name] [name] is a 56 y.o. male who has previously seen my partner Dr. Scot Dock in 2015 for a thrombosed right popliteal artery aneurysm and a right popliteal vein DVT. At that time the popliteal aneurysm in the right leg was completely thrombosed and the patient had an ABI of 0.7 so no intervention was undertaken. The thinking at that point was that there was no risk of distal embolization because the popliteal aneurysm risk had artery abated with thrombosis of the aneurysm. He was scheduled for a follow-up duplex to see if he had a contralateral popliteal aneurysm as well as screening for abdominal aortic aneurysm which can be associated with popliteal aneurysms. Unfortunately the patient never return for follow-up. The patient then presented to Dr. Beryle Beams in October of this year complaining of pain in his thigh.  At that time he was found to have thrombosis of his right greater saphenous vein as well as what was read as "acute arterial thrombosis of his right superficial femoral artery".  He was transitioned from warfarin to Lovenox. The pain improved over a few days. He is now being transitioned back to his warfarin. He denies any rest pain in his foot. He does develop claudication type symptoms in his right calf after walking about a quarter mile. However the symptoms are essentially unchanged since 2015. He also has numbness and tingling in both feet left equals right similar to 2015. He did recently injure the dorsum of one of his toes while cutting his nails. This is slowly healing. He does not have any history of atrial fibrillation. He denies any family history of abdominal aortic or popliteal aneurysms. Other medical problems include hypertension hyperlipidemia polycythemia all of which have been stable. His INRs have  consistently and reliably been in the therapeutic range. He does smoke about a half to 1 pack of cigarettes per day. Greater than 3 minutes today spent regarding smoking cessation counseling. He is on aspirin and a statin.  Past Medical History:  Diagnosis Date  . 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 Benign essential HTN 01/04/2012  . Chronic anticoagulation 01/02/2013  . Dermatofibroma of forearm 01/02/2013   Left side  . Hyperlipidemia, mixed 01/04/2012  . 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 Primary hypercoagulable state ([place]) 10/01/2014  . Sinus bradycardia, chronic 01/04/2012  . Superficial thrombosis of lower extremity 05/02/2012   No past surgical history on file.  No family history on file.  SOCIAL HISTORY: Social History   Socioeconomic History  . Marital status: Married    Spouse name: Not on file  . Number of children: Not on file  . Years of education: Not on file  . Highest education level: Not on file  Social Needs  . Financial resource strain: Not on file  . Food insecurity - worry: Not on file  . Food insecurity - inability: Not on file  . Transportation needs - medical: Not on file  . Transportation needs - non-medical: Not on file  Occupational History  . Not on file  Tobacco Use  . Smoking status: Current Every Day Smoker    Packs/day: 0.50    Years: 30.00  Pack years: 15.00    Types: Cigarettes  . Smokeless tobacco: Never Used  . Tobacco comment: Varies.  Substance and Sexual Activity  . Alcohol use: Yes    Alcohol/week: 0.0 oz    Comment: 1-2 times per week.  . Drug use: No  . Sexual activity: Not on file  Other Topics Concern  . Not on file  Social History Narrative  . Not on file    No Known Allergies  Current Outpatient Medications  Medication Sig Dispense  Refill  . amLODipine (NORVASC) 10 MG tablet Take 1 tablet (10 mg total) by mouth daily. 30 tablet 11  . aspirin 81 MG tablet Take 81 mg by mouth daily.    Marland Kitchen atorvastatin (LIPITOR) 10 MG tablet Take 1 tablet (10 mg total) by mouth daily. 30 tablet 11  . Coenzyme Q10-Fish Oil-Vit E (CO-Q 10 OMEGA-3 FISH OIL) CAPS Take 1 capsule by mouth 2 (two) times daily. 60 capsule 6  . enoxaparin (LOVENOX) 30 MG/0.3ML injection Take 180 mg every 24 hours. 0 Syringe 0  . triamterene-hydrochlorothiazide (DYAZIDE) 37.5-25 MG capsule Take 1 each (1 capsule total) by mouth daily. 30 capsule 11   No current facility-administered medications for this visit.     ROS:   General:  No weight loss, Fever, chills  HEENT: No recent headaches, no nasal bleeding, no visual changes, no sore throat  Neurologic: No dizziness, blackouts, seizures. No recent symptoms of stroke or mini- stroke. No recent episodes of slurred speech, or temporary blindness.  Cardiac: No recent episodes of chest pain/pressure, no shortness of breath at rest.  No shortness of breath with exertion.  Denies history of atrial fibrillation or irregular heartbeat  Vascular: No history of rest pain in feet.  + history of claudication.  No history of non-healing ulcer, No history of DVT   Pulmonary: No home oxygen, no productive cough, no hemoptysis,  No asthma or wheezing  Musculoskeletal:  [ ]  Arthritis, [ ]  Low back pain,  [ ]  Joint pain  Hematologic:No history of hypercoagulable state.  No history of easy bleeding.  No history of anemia  Gastrointestinal: No hematochezia or melena,  No gastroesophageal reflux, no trouble swallowing  Urinary: [ ]  chronic Kidney disease, [ ]  on HD - [ ]  MWF or [ ]  TTHS, [ ]  Burning with urination, [ ]  Frequent urination, [ ]  Difficulty urinating;   Skin: No rashes  Psychological: No history of anxiety,  No history of depression   Physical Examination  General:  Alert and oriented, no acute  distress HEENT: Normal Neck: No bruit or JVD Pulmonary: Clear to auscultation bilaterally Cardiac: Regular Rate and Rhythm without murmur Abdomen: Soft, non-tender, non-distended, no mass Skin: No rash, feet and dusky in appearance bilaterally but symmetric temperature, abrasion right second toe 2 cm in length 3 mm in width with dry eschar, abrasions dorsum left toes 2 through 4 also healing Extremity Pulses:  2+ radial, brachial, femoral, absent right popliteal 3+ left popliteal 2+ left dorsalis pedis, posterior tibial pulses absent pedal pulses right foot Musculoskeletal: No deformity or edema  Neurologic: Upper and lower extremity motor 5/5 and symmetric  DATA:  The patient had bilateral ABIs performed today. Right side was 0.6. Left side was greater than 1 and normal.  ASSESSMENT:  Right thigh pain now resolved. The patient had thrombosis of his right greater saphenous vein. His pain may have been an episode of thrombophlebitis. This may have been the source of his pain. It is difficult  to know whether or not there was actually "acute" clot in his right superficial femoral artery. However, it would not be unusual for a thrombosed popliteal aneurysm to propagate clot proximally and usually this does not cause pain. Additionally acute arterial embolus all causes pain distal to the obstruction not at the site of obstruction. His pain has now resolved. His ABIs are essentially unchanged from 2 years ago. I do not believe he has overall had a significant change in his right lower extremity arterial tree. The patient was reassured today that if he has had some clot propagation this would not be that unusual and that since the popliteal aneurysm is completely thrombosed there is essentially no risk of distal embolization. He would also be very unusual for him to have thrombosed his artery in the presence of warfarin. In light of this I believe the best course of management for his right lower extremity is  continued observation with consideration for bypass only in the event that he has a nonhealing wound or develops rest pain in the right foot.  As per Dr. Nicole Cella previous evaluation in 2015 the patient needs a left lower extremity duplex to rule out a left popliteal aneurysm as well as an abdominal aortic ultrasound to rule out abdominal aortic aneurysm. These will be scheduled for the patient next week and he will see me in follow-up after that.  We will also recheck the wound on his right foot to make sure this has completely healed.  The patient needs to quit smoking.   PLAN:  See above   Ruta Hinds, MD [facility] Office: [phone] Pager: [phone]

## 2017-05-31 ENCOUNTER — Encounter: Payer: Self-pay | Admitting: Vascular Surgery

## 2017-06-01 ENCOUNTER — Ambulatory Visit (INDEPENDENT_AMBULATORY_CARE_PROVIDER_SITE_OTHER)
Admission: RE | Admit: 2017-06-01 | Discharge: 2017-06-01 | Disposition: A | Discharge status: Home / Self Care | Payer: Self-pay | Source: Ambulatory Visit | Attending: Vascular Surgery | Admitting: Vascular Surgery

## 2017-06-01 ENCOUNTER — Other Ambulatory Visit: Payer: Self-pay | Admitting: Vascular Surgery

## 2017-06-01 ENCOUNTER — Other Ambulatory Visit (HOSPITAL_COMMUNITY): Payer: Self-pay | Admitting: Family Medicine

## 2017-06-01 ENCOUNTER — Ambulatory Visit (HOSPITAL_COMMUNITY)
Admission: RE | Admit: 2017-06-01 | Discharge: 2017-06-01 | Disposition: A | Discharge status: Home / Self Care | Payer: Self-pay | Source: Ambulatory Visit | Attending: Vascular Surgery | Admitting: Vascular Surgery

## 2017-06-01 DIAGNOSIS — I714 Abdominal aortic aneurysm, without rupture, unspecified: Secondary | ICD-10-CM

## 2017-06-01 DIAGNOSIS — I724 Aneurysm of artery of lower extremity: Secondary | ICD-10-CM

## 2017-06-03 ENCOUNTER — Encounter: Payer: Self-pay | Admitting: Vascular Surgery

## 2017-06-03 ENCOUNTER — Ambulatory Visit (INDEPENDENT_AMBULATORY_CARE_PROVIDER_SITE_OTHER): Payer: Self-pay | Admitting: Vascular Surgery

## 2017-06-03 VITALS — BP 153/100 | HR 83 | Temp 97.2°F | Resp 16 | Ht 75.0 in | Wt 266.5 lb

## 2017-06-03 DIAGNOSIS — I739 Peripheral vascular disease, unspecified: Secondary | ICD-10-CM

## 2017-06-03 NOTE — Progress Notes (Signed)
Patient is a 56 year old male who returns for follow-up today. He was last seen on 05/30/2017. At that point he had been having some problems with thigh pain. He was found to have a thrombosed greater saphenous vein. He has a known chronically occluded right popliteal aneurysm. He returns today after recent ultrasound of his abdominal aorta and left popliteal artery to rule out aneurysms. He states he currently has no pain in his right leg. He does develop what sounds like claudication symptoms after walking about 3-400 yards in the right leg. This has been chronic and has not changed. He states the scar on his right second toe is slowly healing. He denies rest pain.  Physical exam:  Vitals:   06/03/17 1305  BP: (!) 153/100  Pulse: 83  Resp: 16  Temp: (!) 97.2 F (36.2 C)  SpO2: 98%  Weight: 266 lb 8 oz (120.9 kg)  Height: 6\' 3"  (1.905 m)    Right foot: 3 mm dry eschar dorsum right second toe appears to be healing no palpable pedal pulses  Assessment: #1 chronically thrombosed right popliteal aneurysm most likely not the etiology for his recent leg pain. No real significant change in his perfusion since 2015 with an ABI currently of 0.6.  Patient will follow-up with me in 1 year with repeat ABIs. He will try to walk 30 minutes daily to encourage collaterals. We would only consider an intervention on the right leg if he had limb threatening ischemia consisting of rest pain in the right foot or nonhealing wounds.  #2 popliteal ultrasound the left side shows no left popliteal aneurysm  #3 abdominal aortic ultrasound shows no abdominal aortic aneurysm  Ruta Hinds, MD [facility] [place] Office: [phone] Pager: [phone]

## 2017-06-04 ENCOUNTER — Encounter: Payer: Self-pay | Admitting: Vascular Surgery

## 2017-06-07 NOTE — Addendum Note (Signed)
Addended by: Lianne Cure A on: 06/07/2017 04:01 PM   Modules accepted: Orders

## 2017-06-14 ENCOUNTER — Ambulatory Visit (INDEPENDENT_AMBULATORY_CARE_PROVIDER_SITE_OTHER): Payer: Self-pay | Admitting: Pharmacist

## 2017-06-14 DIAGNOSIS — Z7901 Long term (current) use of anticoagulants: Secondary | ICD-10-CM

## 2017-06-14 DIAGNOSIS — I2699 Other pulmonary embolism without acute cor pulmonale: Secondary | ICD-10-CM

## 2017-06-14 DIAGNOSIS — Z86718 Personal history of other venous thrombosis and embolism: Secondary | ICD-10-CM

## 2017-06-14 DIAGNOSIS — I2609 Other pulmonary embolism with acute cor pulmonale: Secondary | ICD-10-CM

## 2017-06-14 LAB — POCT INR: INR: 2.9

## 2017-06-14 NOTE — Progress Notes (Signed)
Anticoagulation Management [name] [name] is a 56 y.o. male who reports to the clinic for monitoring of warfarin treatment.    Indication: PE, history of DVT, long term use of anticoagulants.   Duration: indefinite Supervising physician: Murriel Hopper  Anticoagulation Clinic Visit History: Patient does not report signs/symptoms of bleeding or thromboembolism  Other recent changes: No diet, medications, lifestyle changes endorsed by the patient to me except as noted in patient findings section.  Anticoagulation Episode Summary    Current INR goal:   2.5-3.5  TTR:   35.4 % (6 y)  Next INR check:   08/09/2017  INR from last check:   2.90 (06/14/2017)  Weekly max warfarin dose:     Target end date:     INR check location:     Preferred lab:     Send INR reminders to:   [facility]   Indications   Pulmonary embolism ([place]) [I26.99]       Comments:         Anticoagulation Care Providers    Provider Role Specialty Phone number   Annia Belt, MD Referring Oncology [phone]      No Known Allergies Prior to Admission medications   Medication Sig Start Date End Date Taking? Authorizing Provider  amLODipine (NORVASC) 10 MG tablet Take 1 tablet (10 mg total) by mouth daily. 04/13/17 04/13/18 Yes Annia Belt, MD  aspirin 81 MG tablet Take 81 mg by mouth daily.   Yes [provider]  atorvastatin (LIPITOR) 10 MG tablet Take 1 tablet (10 mg total) by mouth daily. 04/13/17 04/13/18 Yes Annia Belt, MD  Coenzyme Q10-Fish Oil-Vit E (CO-Q 10 OMEGA-3 FISH OIL) CAPS Take 1 capsule by mouth 2 (two) times daily. 10/01/14  Yes Annia Belt, MD  triamterene-hydrochlorothiazide (DYAZIDE) 37.5-25 MG capsule Take 1 each (1 capsule total) by mouth daily. 04/13/17  Yes Annia Belt, MD  warfarin (COUMADIN) 10 MG tablet Take 10 mg daily by mouth.   Yes [provider]  enoxaparin (LOVENOX) 30 MG/0.3ML injection Take 180 mg every 24 hours.  05/10/17 05/19/17  Zada Finders, MD   Past Medical History:  Diagnosis Date  . 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 Benign essential HTN 01/04/2012  . Chronic anticoagulation 01/02/2013  . Dermatofibroma of forearm 01/02/2013   Left side  . Hyperlipidemia, mixed 01/04/2012  . 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 Primary hypercoagulable state ([place]) 10/01/2014  . Sinus bradycardia, chronic 01/04/2012  . Superficial thrombosis of lower extremity 05/02/2012   Social History   Socioeconomic History  . Marital status: Married    Spouse name: Not on file  . Number of children: Not on file  . Years of education: Not on file  . Highest education level: Not on file  Social Needs  . Financial resource strain: Not on file  . Food insecurity - worry: Not on file  . Food insecurity - inability: Not on file  . Transportation needs - medical: Not on file  . Transportation needs - non-medical: Not on file  Occupational History  . Not on file  Tobacco Use  . Smoking status: Current Every Day Smoker    Packs/day: 0.50    Years: 30.00    Pack years: 15.00    Types: Cigarettes  . Smokeless tobacco: Never  Used  . Tobacco comment: Varies.  Substance and Sexual Activity  . Alcohol use: Yes    Alcohol/week: 0.0 oz    Comment: 1-2 times per week.  . Drug use: No  . Sexual activity: Not on file  Other Topics Concern  . Not on file  Social History Narrative  . Not on file   No family history on file.  ASSESSMENT Recent Results: The most recent result is correlated with 70 mg per week: Lab Results  Component Value Date   INR 2.90 06/14/2017   INR 1.65 05/25/2017   INR 2.87 05/03/2017   PROTIME 28.8 (H) 02/04/2015    Anticoagulation Dosing: Description   Take one (1) tablet of your 10mg  white-colored warfarin  tablets on all days of the week--EXCEPT on MONDAYS, take only one-half (1/2) tablet of your 10mg  white-colored warfarin tablets on Mondays of each week.      INR today: Therapeutic  PLAN Weekly dose was decreased by 7% to 65 mg per week  Patient Instructions  Patient instructed to take medications as defined in the Anti-coagulation Track section of this encounter.  Patient instructed to take today's dose.  Patient instructed to take one (1) tablet of your 10mg  white-colored warfarin tablets on all days of the week--EXCEPT on MONDAYS, take only one-half (1/2) tablet of your 10mg  white-colored warfarin tablets on Mondays of each week.  Patient verbalized understanding of these instructions.     Patient advised to contact clinic or seek medical attention if signs/symptoms of bleeding or thromboembolism occur.  Patient verbalized understanding by repeating back information and was advised to contact me if further medication-related questions arise. Patient was also provided an information handout.  Follow-up Return in 8 weeks (on 08/09/2017) for Follow up INR at 2:30PM.  Pennie Banter, PharmD, CACP, CPP  15 minutes spent face-to-face with the patient during the encounter. 50% of time spent on education. 50% of time was spent on fingerstick point of care INR sample collection, processing, results determination, dose adjustment and documentation in [email or web address].

## 2017-06-14 NOTE — Patient Instructions (Signed)
Patient instructed to take medications as defined in the Anti-coagulation Track section of this encounter.  Patient instructed to take today's dose.  Patient instructed to take one (1) tablet of your 10mg  white-colored warfarin tablets on all days of the week--EXCEPT on MONDAYS, take only one-half (1/2) tablet of your 10mg  white-colored warfarin tablets on Mondays of each week.  Patient verbalized understanding of these instructions.

## 2017-06-15 NOTE — Progress Notes (Signed)
Reviewed thx DrG 

## 2017-08-30 ENCOUNTER — Ambulatory Visit (INDEPENDENT_AMBULATORY_CARE_PROVIDER_SITE_OTHER): Payer: Self-pay | Admitting: Pharmacist

## 2017-08-30 ENCOUNTER — Encounter (INDEPENDENT_AMBULATORY_CARE_PROVIDER_SITE_OTHER): Payer: Self-pay

## 2017-08-30 DIAGNOSIS — Z7901 Long term (current) use of anticoagulants: Secondary | ICD-10-CM

## 2017-08-30 DIAGNOSIS — I2699 Other pulmonary embolism without acute cor pulmonale: Secondary | ICD-10-CM

## 2017-08-30 DIAGNOSIS — I2602 Saddle embolus of pulmonary artery with acute cor pulmonale: Secondary | ICD-10-CM

## 2017-08-30 LAB — POCT INR: INR: 3.7

## 2017-08-30 NOTE — Progress Notes (Signed)
Anticoagulation Management [name] [name] is a 57 y.o. male who reports to the clinic for monitoring of warfarin treatment.    Indication: PE, history of; long term (current) use of anticoagulants.  Duration: indefinite Supervising physician: Murriel Hopper  Anticoagulation Clinic Visit History: Patient does not report signs/symptoms of bleeding or thromboembolism  Other recent changes: No diet, medications, lifestyle changes.  Anticoagulation Episode Summary    Current INR goal:   2.5-3.5  TTR:   36.7 % (6.2 y)  Next INR check:   09/27/2017  INR from last check:   3.70! (08/30/2017)  Weekly max warfarin dose:     Target end date:     INR check location:     Preferred lab:     Send INR reminders to:   [facility]   Indications   Pulmonary embolism ([place]) [I26.99]       Comments:         Anticoagulation Care Providers    Provider Role Specialty Phone number   Annia Belt, MD Referring Oncology [phone]      No Known Allergies Prior to Admission medications   Medication Sig Start Date End Date Taking? Authorizing Provider  amLODipine (NORVASC) 10 MG tablet Take 1 tablet (10 mg total) by mouth daily. 04/13/17 04/13/18 Yes Annia Belt, MD  aspirin 81 MG tablet Take 81 mg by mouth daily.   Yes [provider]  Coenzyme Q10-Fish Oil-Vit E (CO-Q 10 OMEGA-3 FISH OIL) CAPS Take 1 capsule by mouth 2 (two) times daily. 10/01/14  Yes Annia Belt, MD  warfarin (COUMADIN) 10 MG tablet Take 10 mg daily by mouth.   Yes [provider]  atorvastatin (LIPITOR) 10 MG tablet Take 1 tablet (10 mg total) by mouth daily. Patient not taking: Reported on 08/30/2017 04/13/17 04/13/18  Annia Belt, MD  enoxaparin (LOVENOX) 30 MG/0.3ML injection Take 180 mg every 24 hours. 05/10/17 05/19/17  Zada Finders, MD  triamterene-hydrochlorothiazide (DYAZIDE) 37.5-25 MG capsule Take 1 each (1 capsule total) by mouth daily. Patient not taking: Reported on  08/30/2017 04/13/17   Annia Belt, MD   Past Medical History:  Diagnosis Date  . 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 Benign essential HTN 01/04/2012  . Chronic anticoagulation 01/02/2013  . Dermatofibroma of forearm 01/02/2013   Left side  . Hyperlipidemia, mixed 01/04/2012  . 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 Primary hypercoagulable state ([place]) 10/01/2014  . Sinus bradycardia, chronic 01/04/2012  . Superficial thrombosis of lower extremity 05/02/2012   Social History   Socioeconomic History  . Marital status: Married    Spouse name: Not on file  . Number of children: Not on file  . Years of education: Not on file  . Highest education level: Not on file  Social Needs  . Financial resource strain: Not on file  . Food insecurity - worry: Not on file  . Food insecurity - inability: Not on file  . Transportation needs - medical: Not on file  . Transportation needs - non-medical: Not on file  Occupational History  . Not on file  Tobacco Use  . Smoking status: Current Every Day Smoker    Packs/day: 0.50    Years: 30.00    Pack years: 15.00    Types: Cigarettes  . Smokeless tobacco: Never Used  .  Tobacco comment: Varies.  Substance and Sexual Activity  . Alcohol use: Yes    Alcohol/week: 0.0 oz    Comment: 1-2 times per week.  . Drug use: No  . Sexual activity: Not on file  Other Topics Concern  . Not on file  Social History Narrative  . Not on file   No family history on file.  ASSESSMENT Recent Results: The most recent result is correlated with 65 mg per week: Lab Results  Component Value Date   INR 3.70 08/30/2017   INR 2.90 06/14/2017   INR 1.65 05/25/2017   PROTIME 28.8 (H) 02/04/2015    Anticoagulation Dosing: Description   Take one (1) tablet of your 10mg  white-colored  warfarin tablets on all days of the week--EXCEPT on MONDAYS and THURSDAYS, take only one-half (1/2) tablet of your 10mg  white-colored warfarin tablets on Mondays and Thursdays of each week. OMIT dose for today, Monday 30-August-2017.     INR today: Supratherapeutic  PLAN Weekly dose was decreased by 8% to 65 mg per week  Patient Instructions  Patient instructed to take medications as defined in the Anti-coagulation Track section of this encounter.  Patient instructed to OMIT today's dose.  Patient instructed to take one (1) tablet of your 10mg  white-colored warfarin tablets on all days of the week--EXCEPT on MONDAYS and THURSDAYS, take only one-half (1/2) tablet of your 10mg  white-colored warfarin tablets on Mondays and Thursdays of each week. OMIT dose for today, Monday 30-August-2017. Patient verbalized understanding of these instructions.     Patient advised to contact clinic or seek medical attention if signs/symptoms of bleeding or thromboembolism occur.  Patient verbalized understanding by repeating back information and was advised to contact me if further medication-related questions arise. Patient was also provided an information handout.  Follow-up Return in 4 weeks (on 09/27/2017) for Follow up INR at 2:15PM.  Pennie Banter, PharmD, CACP, CPP  15 minutes spent face-to-face with the patient during the encounter. 50% of time spent on education. 50% of time was spent on fingerstick point of care INR sample collection, processing, results determination, dose adjustment and documentation in [email or web address].

## 2017-08-30 NOTE — Patient Instructions (Signed)
Patient instructed to take medications as defined in the Anti-coagulation Track section of this encounter.  Patient instructed to OMIT today's dose.  Patient instructed to take one (1) tablet of your 10mg  white-colored warfarin tablets on all days of the week--EXCEPT on MONDAYS and THURSDAYS, take only one-half (1/2) tablet of your 10mg  white-colored warfarin tablets on Mondays and Thursdays of each week. OMIT dose for today, Monday 30-August-2017. Patient verbalized understanding of these instructions.

## 2017-08-30 NOTE — Progress Notes (Signed)
Reviewed thx DrG 

## 2017-09-27 ENCOUNTER — Ambulatory Visit: Payer: Self-pay

## 2017-10-25 ENCOUNTER — Encounter (INDEPENDENT_AMBULATORY_CARE_PROVIDER_SITE_OTHER): Payer: Self-pay

## 2017-10-25 ENCOUNTER — Ambulatory Visit (INDEPENDENT_AMBULATORY_CARE_PROVIDER_SITE_OTHER): Payer: Self-pay | Admitting: Pharmacist

## 2017-10-25 DIAGNOSIS — Z7901 Long term (current) use of anticoagulants: Secondary | ICD-10-CM

## 2017-10-25 DIAGNOSIS — I2690 Septic pulmonary embolism without acute cor pulmonale: Secondary | ICD-10-CM

## 2017-10-25 DIAGNOSIS — I2699 Other pulmonary embolism without acute cor pulmonale: Secondary | ICD-10-CM

## 2017-10-25 DIAGNOSIS — Z5181 Encounter for therapeutic drug level monitoring: Secondary | ICD-10-CM

## 2017-10-25 LAB — POCT INR: INR: 2.2

## 2017-10-25 NOTE — Patient Instructions (Signed)
Patient instructed to take medications as defined in the Anti-coagulation Track section of this encounter.  Patient instructed to take today's dose.  Patient instructed to take one (1) tablet of your 10mg  white-colored warfarin tablets on all days of the week--EXCEPT on MONDAYS and THURSDAYS, take only one-half (1/2) tablet of your 10mg  white-colored warfarin tablets on Mondays and Thursdays of each week. Patient verbalized understanding of these instructions.

## 2017-10-25 NOTE — Progress Notes (Signed)
Anticoagulation Management [name] [name] is a 57 y.o. male who reports to the clinic for monitoring of warfarin treatment.    Indication: PE , Hx of; long term use of anticoagulant.  Duration: indefinite Supervising physician: Murriel Hopper  Anticoagulation Clinic Visit History: Patient does not report signs/symptoms of bleeding or thromboembolism  Other recent changes: No diet, medications, lifestyle except as noted in patient findings.  Anticoagulation Episode Summary    Current INR goal:   2.5-3.5  TTR:   37.4 % (6.4 y)  Next INR check:   12/13/2017  INR from last check:   2.20! (10/25/2017)  Weekly max warfarin dose:     Target end date:     INR check location:     Preferred lab:     Send INR reminders to:   [facility]   Indications   Pulmonary embolism ([place]) [I26.99]       Comments:         Anticoagulation Care Providers    Provider Role Specialty Phone number   Annia Belt, MD Referring Oncology [phone]      No Known Allergies Prior to Admission medications   Medication Sig Start Date End Date Taking? Authorizing Provider  amLODipine (NORVASC) 10 MG tablet Take 1 tablet (10 mg total) by mouth daily. 04/13/17 04/13/18 Yes Annia Belt, MD  aspirin 81 MG tablet Take 81 mg by mouth daily.   Yes [provider]  atorvastatin (LIPITOR) 10 MG tablet Take 1 tablet (10 mg total) by mouth daily. 04/13/17 04/13/18 Yes Annia Belt, MD  Coenzyme Q10-Fish Oil-Vit E (CO-Q 10 OMEGA-3 FISH OIL) CAPS Take 1 capsule by mouth 2 (two) times daily. 10/01/14  Yes Annia Belt, MD  triamterene-hydrochlorothiazide (DYAZIDE) 37.5-25 MG capsule Take 1 each (1 capsule total) by mouth daily. 04/13/17  Yes Annia Belt, MD  warfarin (COUMADIN) 10 MG tablet Take 10 mg daily by mouth.   Yes [provider]  enoxaparin (LOVENOX) 30 MG/0.3ML injection Take 180 mg every 24 hours. 05/10/17 05/19/17  Zada Finders, MD   Past Medical  History:  Diagnosis Date  . 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 Benign essential HTN 01/04/2012  . Chronic anticoagulation 01/02/2013  . Dermatofibroma of forearm 01/02/2013   Left side  . Hyperlipidemia, mixed 01/04/2012  . 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 Primary hypercoagulable state ([place]) 10/01/2014  . Sinus bradycardia, chronic 01/04/2012  . Superficial thrombosis of lower extremity 05/02/2012   Social History   Socioeconomic History  . Marital status: Married    Spouse name: Not on file  . Number of children: Not on file  . Years of education: Not on file  . Highest education level: Not on file  Occupational History  . Not on file  Social Needs  . Financial resource strain: Not on file  . Food insecurity:    Worry: Not on file    Inability: Not on file  . Transportation needs:    Medical: Not on file    Non-medical: Not on file  Tobacco Use  . Smoking status: Current Every Day Smoker    Packs/day: 0.50    Years: 30.00    Pack years: 15.00    Types: Cigarettes  . Smokeless tobacco: Never Used  . Tobacco comment: Varies.  Substance and  Sexual Activity  . Alcohol use: Yes    Alcohol/week: 0.0 oz    Comment: 1-2 times per week.  . Drug use: No  . Sexual activity: Not on file  Lifestyle  . Physical activity:    Days per week: Not on file    Minutes per session: Not on file  . Stress: Not on file  Relationships  . Social connections:    Talks on phone: Not on file    Gets together: Not on file    Attends religious service: Not on file    Active member of club or organization: Not on file    Attends meetings of clubs or organizations: Not on file    Relationship status: Not on file  Other Topics Concern  . Not on file  Social History Narrative  . Not on file   No family history  on file.  ASSESSMENT Recent Results: The most recent result is correlated with 60 mg per week: Lab Results  Component Value Date   INR 2.20 10/25/2017   INR 3.70 08/30/2017   INR 2.90 06/14/2017   PROTIME 28.8 (H) 02/04/2015    Anticoagulation Dosing: Description   Take one (1) tablet of your 10mg  white-colored warfarin tablets on all days of the week--EXCEPT on MONDAYS and THURSDAYS, take only one-half (1/2) tablet of your 10mg  white-colored warfarin tablets on Mondays and Thursdays of each week.     INR today: Therapeutic  PLAN Weekly dose was unchanged.  Patient Instructions  Patient instructed to take medications as defined in the Anti-coagulation Track section of this encounter.  Patient instructed to take today's dose.  Patient instructed to take one (1) tablet of your 10mg  white-colored warfarin tablets on all days of the week--EXCEPT on MONDAYS and THURSDAYS, take only one-half (1/2) tablet of your 10mg  white-colored warfarin tablets on Mondays and Thursdays of each week. Patient verbalized understanding of these instructions.     Patient advised to contact clinic or seek medical attention if signs/symptoms of bleeding or thromboembolism occur.  Patient verbalized understanding by repeating back information and was advised to contact me if further medication-related questions arise. Patient was also provided an information handout.  Follow-up Return in 7 weeks (on 12/13/2017) for Follow up INR at [place], PharmD, CACP, CPP  15 minutes spent face-to-face with the patient during the encounter. 50% of time spent on education. 50% of time was spent on fingerstick point of care INR sample collection, processing, results determination and documentation in [email or web address].

## 2017-10-25 NOTE — Progress Notes (Signed)
Reviewed Thanks DrG 

## 2017-11-01 ENCOUNTER — Encounter: Payer: Self-pay | Admitting: Oncology

## 2017-11-01 ENCOUNTER — Telehealth: Payer: Self-pay | Admitting: *Deleted

## 2017-11-01 ENCOUNTER — Other Ambulatory Visit (INDEPENDENT_AMBULATORY_CARE_PROVIDER_SITE_OTHER): Payer: Self-pay

## 2017-11-01 ENCOUNTER — Ambulatory Visit (INDEPENDENT_AMBULATORY_CARE_PROVIDER_SITE_OTHER): Payer: Self-pay | Admitting: Oncology

## 2017-11-01 ENCOUNTER — Other Ambulatory Visit: Payer: Self-pay | Admitting: Oncology

## 2017-11-01 VITALS — BP 120/85 | HR 76 | Temp 98.5°F | Wt 269.0 lb

## 2017-11-01 DIAGNOSIS — D689 Coagulation defect, unspecified: Secondary | ICD-10-CM

## 2017-11-01 DIAGNOSIS — Z7901 Long term (current) use of anticoagulants: Secondary | ICD-10-CM

## 2017-11-01 DIAGNOSIS — I824Y1 Acute embolism and thrombosis of unspecified deep veins of right proximal lower extremity: Secondary | ICD-10-CM

## 2017-11-01 DIAGNOSIS — Z5181 Encounter for therapeutic drug level monitoring: Secondary | ICD-10-CM

## 2017-11-01 DIAGNOSIS — D751 Secondary polycythemia: Secondary | ICD-10-CM

## 2017-11-01 DIAGNOSIS — I82811 Embolism and thrombosis of superficial veins of right lower extremities: Secondary | ICD-10-CM

## 2017-11-01 DIAGNOSIS — D6859 Other primary thrombophilia: Secondary | ICD-10-CM

## 2017-11-01 LAB — CBC WITH DIFFERENTIAL/PLATELET
BASOS ABS: 0 10*3/uL (ref 0.0–0.1)
BASOS PCT: 0 %
Eosinophils Absolute: 0.1 10*3/uL (ref 0.0–0.7)
Eosinophils Relative: 2 %
HEMATOCRIT: 53.3 % — AB (ref 39.0–52.0)
HEMOGLOBIN: 18.4 g/dL — AB (ref 13.0–17.0)
LYMPHS PCT: 31 %
Lymphs Abs: 1.4 10*3/uL (ref 0.7–4.0)
MCH: 32.3 pg (ref 26.0–34.0)
MCHC: 34.5 g/dL (ref 30.0–36.0)
MCV: 93.5 fL (ref 78.0–100.0)
Monocytes Absolute: 0.8 10*3/uL (ref 0.1–1.0)
Monocytes Relative: 19 %
NEUTROS ABS: 2.1 10*3/uL (ref 1.7–7.7)
NEUTROS PCT: 48 %
Platelets: 144 10*3/uL — ABNORMAL LOW (ref 150–400)
RBC: 5.7 MIL/uL (ref 4.22–5.81)
RDW: 14.1 % (ref 11.5–15.5)
WBC: 4.4 10*3/uL (ref 4.0–10.5)

## 2017-11-01 LAB — POCT INR: INR: 2.2

## 2017-11-01 LAB — D-DIMER, QUANTITATIVE: D-Dimer, Quant: 0.81 ug/mL-FEU — ABNORMAL HIGH (ref 0.00–0.50)

## 2017-11-01 MED ORDER — ENOXAPARIN SODIUM 150 MG/ML ~~LOC~~ SOLN
120.0000 mg | Freq: Once | SUBCUTANEOUS | Status: AC
  Administered 2017-11-01: 120 mg via SUBCUTANEOUS

## 2017-11-01 NOTE — Addendum Note (Signed)
Addended by: Annia Belt on: 11/01/2017 04:41 PM   Modules accepted: Level of Service

## 2017-11-01 NOTE — Telephone Encounter (Signed)
Pt's here. Dr Elie Confer aware pt is coming for PT/INR; pt weighed 269.0 lbs. Doppler scheduled for tomorrow at [facility] at 1100 AM - appt info given to pt.

## 2017-11-01 NOTE — Telephone Encounter (Signed)
Noted. I will see him today.

## 2017-11-01 NOTE — Telephone Encounter (Signed)
Pt will come in today for labs  

## 2017-11-01 NOTE — Patient Instructions (Addendum)
Hematology and Oncology Follow Up Visit  [name] [name] [id number] 01/29/1961 57 y.o. 11/01/2017 5:14 PM   Principle Diagnosis: Encounter Diagnoses  Name Primary?  . Superficial thrombosis of right lower extremity Yes  . Chronic anticoagulation   . Polycythemia secondary to smoking   . 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  He is on chronic anticoagulation with warfarin.  He cannot afford the newer anticoagulants.  More recently he has had recurrent bouts of superficial phlebitis.  This despite adequate warfarin anticoagulation.  He takes Lovenox for a few days and the inflammatory phlebitis subsides.  He noted a recurrent episode starting about 1 week ago in the same area of his right leg.  Most recent INR was 2.2 just checked on April 8. He denies any deep calf pain.  He denies any dyspnea chest pain or palpitations.  Medications: reviewed  Allergies: No Known Allergies  Review of Systems: He also got bit by a spider right groin area about a week ago.  He is taking some doxycycline.  He had some pains in his left ankle and right wrist.  No fevers. See interim history Remaining ROS negative:   Physical Exam: There were no vitals taken for this visit. Wt Readings from Last 3 Encounters:  11/01/17 269 lb (122 kg)  06/03/17 266 lb 8 oz (120.9 kg)  05/27/17 266 lb (120.7 kg)     General appearance: Overweight Caucasian man with a plethoric face HENNT: Pharynx no erythema, exudate, mass, or ulcer. No thyromegaly or thyroid nodules Lymph nodes: No cervical, supraclavicular, or axillary lymphadenopathy Breasts:  Lungs: Clear to auscultation, diffuse decreased breath sounds resonant to percussion throughout Heart: Regular rhythm, slow rate, no  murmur, no gallop, no rub, no click, no edema Abdomen: Soft, nontender, normal bowel sounds, no mass, no organomegaly Extremities: No edema, no calf tenderness Superficial tenderness and erythema along a 25 cm area anterior right shin.  Additional circular spongy area medial to that at the level of the popliteal fossa. Calf measurements: 44 cm on the left, 43 cm on the right Ankle measurements: 23 cm on the left, 22 cm on the right Musculoskeletal: no joint deformities GU: Small 3 mm round punctate scab and area level of the right inguinal fold where he got bit by a tick.  No surrounding erythematous reaction or target lesion. Vascular: Neurologic: Alert, oriented, PERRLA, cranial nerves grossly normal, motor strength 5 over 5, reflexes 1+ symmetric, upper body coordination normal, gait normal, Skin: No rash or ecchymosis.  Superficial phlebitis right leg see above.  Lab Results: CBC W/Diff    Component Value Date/Time   WBC 4.4 11/01/2017 1612   RBC 5.70 11/01/2017 1612   HGB 18.4 (H) 11/01/2017 1612   HGB 17.6 05/14/2015 1159   HGB 17.8 (H) 05/21/2014 1228   HCT 53.3 (H) 11/01/2017 1612   HCT 51.1 (H) 05/14/2015 1159   HCT 50.9 (H) 05/21/2014 1228   PLT 144 (L) 11/01/2017 1612   PLT 177 05/14/2015 1159   MCV 93.5 11/01/2017 1612   MCV 90 05/14/2015 1159   MCV 90.9 05/21/2014 1228   MCH 32.3 11/01/2017 1612   MCHC 34.5 11/01/2017 1612   RDW 14.1 11/01/2017 1612   RDW 14.6 05/14/2015 1159   RDW 13.6 05/21/2014 1228  LYMPHSABS 1.4 11/01/2017 1612   LYMPHSABS 1.6 05/14/2015 1159   LYMPHSABS 1.8 05/21/2014 1228   MONOABS 0.8 11/01/2017 1612   MONOABS 0.6 05/21/2014 1228   EOSABS 0.1 11/01/2017 1612   EOSABS 0.2 05/14/2015 1159   EOSABS 0.2 10/15/2011 0224   EOSABS 0.1 03/22/2009 1236   BASOSABS 0.0 11/01/2017 1612   BASOSABS 0.0 05/14/2015 1159   BASOSABS 0.0 05/21/2014 1228     Chemistry      Component Value Date/Time   NA 139 05/25/2017 1641   NA 140 05/14/2015 1159    NA 141 05/21/2014 1226   K 4.9 05/25/2017 1641   K 4.3 05/21/2014 1226   CL 104 05/25/2017 1641   CL 108 (H) 12/28/2012 1405   CO2 28 05/25/2017 1641   CO2 28 05/21/2014 1226   BUN 12 05/25/2017 1641   BUN 12 05/14/2015 1159   BUN 13.2 05/21/2014 1226   CREATININE 0.96 05/25/2017 1641   CREATININE 1.00 09/24/2014 1413   CREATININE 0.9 05/21/2014 1226      Component Value Date/Time   CALCIUM 9.7 05/25/2017 1641   CALCIUM 9.3 05/21/2014 1226   ALKPHOS 69 04/13/2017 0955   ALKPHOS 87 01/08/2014 1203   AST 41 04/13/2017 0955   AST 50 (H) 01/08/2014 1203   ALT 47 04/13/2017 0955   ALT 83 (H) 01/08/2014 1203   BILITOT 0.9 04/13/2017 0955   BILITOT 0.9 05/14/2015 1159   BILITOT 0.67 01/08/2014 1203    INR today 2.2 D-dimer 0.81   Radiological Studies: No results found.  Impression: 1.  Recurrent superficial phlebitis in a man on chronic warfarin anticoagulation for an idiopathic coagulopathy We are going to put him back on Lovenox for the next 5 days.  I am going to increase his target INR range to 2.5-3.5. I told to use topical Voltaren gel for symptomatic relief and he has some at home. Once all the inflammation has subsided, I will consider a referral to a vascular surgeon or vein specialist for a sclerosis or laser procedure.  2.  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  Essential hypertension Normotensive today  4.  Chronic sinus bradycardia  5.  Hyperlipidemia  6.  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  Mild decrease in his white count and platelet count compared with his baseline of unclear etiology.  May be due to the doxycycline he is taking which I told him to stop.  7.  Spider bite No clinical suspicion for Lyme infection.   CC: Patient Care Team: Patient, No Pcp Per as PCP - General (General Practice) Annia Belt, MD as Consulting Physician (Oncology)   Murriel Hopper, MD, [place]   Hematology-Oncology/Internal Medicine     4/15/20195:14 PM

## 2017-11-01 NOTE — Progress Notes (Signed)
Note is already in the chart.  Encounter opened in another window.

## 2017-11-01 NOTE — Telephone Encounter (Signed)
Pt had called c/o right leg swelling, pain, redness - worse last 4 -5 days. Requesting Lovenox - stated he had these symptoms before. Taking Coumadin.   Called / talked to Dr Beryle Beams - stated pt needs to come in for Doppler, PT/INR and D-dimer.  Talked to pt - stated he will be able to come in today. Need to call Vascular to schedule the appt once order is placed.

## 2017-11-01 NOTE — Patient Instructions (Signed)
lovenox 120 mg every 18 hours for 5 days Use topical Voltaren for superficial pain in the veins Increase coumadin back to 10 mg daily Return 4/22 to check INR MD visit 2-3 months

## 2017-11-01 NOTE — Addendum Note (Signed)
Addended by: Ebbie Latus on: 11/01/2017 04:52 PM   Modules accepted: Orders

## 2017-11-02 ENCOUNTER — Ambulatory Visit (HOSPITAL_COMMUNITY): Admission: RE | Admit: 2017-11-02 | Payer: Self-pay | Source: Ambulatory Visit

## 2017-11-08 ENCOUNTER — Ambulatory Visit (INDEPENDENT_AMBULATORY_CARE_PROVIDER_SITE_OTHER): Payer: Self-pay | Admitting: Pharmacist

## 2017-11-08 DIAGNOSIS — I2601 Septic pulmonary embolism with acute cor pulmonale: Secondary | ICD-10-CM

## 2017-11-08 DIAGNOSIS — Z5181 Encounter for therapeutic drug level monitoring: Secondary | ICD-10-CM

## 2017-11-08 DIAGNOSIS — Z7901 Long term (current) use of anticoagulants: Secondary | ICD-10-CM

## 2017-11-08 DIAGNOSIS — I8002 Phlebitis and thrombophlebitis of superficial vessels of left lower extremity: Secondary | ICD-10-CM

## 2017-11-08 DIAGNOSIS — I2782 Chronic pulmonary embolism: Secondary | ICD-10-CM

## 2017-11-08 DIAGNOSIS — I2699 Other pulmonary embolism without acute cor pulmonale: Secondary | ICD-10-CM

## 2017-11-08 LAB — POCT INR: INR: 3.2

## 2017-11-08 NOTE — Progress Notes (Signed)
Reviewed thx DrG 

## 2017-11-08 NOTE — Progress Notes (Signed)
Anticoagulation Management AKI [name] [name] is a 57 y.o. male who reports to the clinic for monitoring of warfarin treatment.    Indication: DVT, history of; PE, history of; superficial thrombophlebitis of left lower extremity; long term use of anticoagulant.  Duration: indefinite Supervising physician: Murriel Hopper  Anticoagulation Clinic Visit History: Patient does not report signs/symptoms of bleeding or thromboembolism  Other recent changes: No diet, medications, lifestyle changes endorsed by patient at this visit.  Anticoagulation Episode Summary    Current INR goal:   2.5-3.5  TTR:   37.4 % (6.4 y)  Next INR check:   11/22/2017  INR from last check:   3.20 (11/08/2017)  Weekly max warfarin dose:     Target end date:     INR check location:     Preferred lab:     Send INR reminders to:   [facility]   Indications   Pulmonary embolism ([place]) [I26.99]       Comments:         Anticoagulation Care Providers    Provider Role Specialty Phone number   Annia Belt, MD Referring Oncology [phone]      No Known Allergies Prior to Admission medications   Medication Sig Start Date End Date Taking? Authorizing Provider  amLODipine (NORVASC) 10 MG tablet Take 1 tablet (10 mg total) by mouth daily. 04/13/17 04/13/18 Yes Annia Belt, MD  aspirin 81 MG tablet Take 81 mg by mouth daily.   Yes [provider]  atorvastatin (LIPITOR) 10 MG tablet Take 1 tablet (10 mg total) by mouth daily. 04/13/17 04/13/18 Yes Annia Belt, MD  Coenzyme Q10-Fish Oil-Vit E (CO-Q 10 OMEGA-3 FISH OIL) CAPS Take 1 capsule by mouth 2 (two) times daily. 10/01/14  Yes Annia Belt, MD  triamterene-hydrochlorothiazide (DYAZIDE) 37.5-25 MG capsule Take 1 each (1 capsule total) by mouth daily. 04/13/17  Yes Annia Belt, MD  warfarin (COUMADIN) 10 MG tablet Take 10 mg daily by mouth.   Yes [provider]  enoxaparin (LOVENOX) 30 MG/0.3ML injection Take 180  mg every 24 hours. 05/10/17 05/19/17  Zada Finders, MD   Past Medical History:  Diagnosis Date  . 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 Benign essential HTN 01/04/2012  . Chronic anticoagulation 01/02/2013  . Dermatofibroma of forearm 01/02/2013   Left side  . Hyperlipidemia, mixed 01/04/2012  . 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 Primary hypercoagulable state ([place]) 10/01/2014  . Sinus bradycardia, chronic 01/04/2012  . Superficial thrombosis of lower extremity 05/02/2012   Social History   Socioeconomic History  . Marital status: Married    Spouse name: Not on file  . Number of children: Not on file  . Years of education: Not on file  . Highest education level: Not on file  Occupational History  . Not on file  Social Needs  . Financial resource strain: Not on file  . Food insecurity:    Worry: Not on file    Inability: Not on file  . Transportation needs:    Medical: Not on file    Non-medical: Not on file  Tobacco Use  . Smoking status: Current Every Day Smoker    Packs/day: 0.50    Years: 30.00    Pack years: 15.00    Types: Cigarettes  . Smokeless tobacco: Never  Used  . Tobacco comment: Varies.  Substance and Sexual Activity  . Alcohol use: Yes    Alcohol/week: 0.0 oz    Comment: 1-2 times per week.  . Drug use: No  . Sexual activity: Not on file  Lifestyle  . Physical activity:    Days per week: Not on file    Minutes per session: Not on file  . Stress: Not on file  Relationships  . Social connections:    Talks on phone: Not on file    Gets together: Not on file    Attends religious service: Not on file    Active member of club or organization: Not on file    Attends meetings of clubs or organizations: Not on file    Relationship status: Not on file  Other Topics Concern  . Not  on file  Social History Narrative  . Not on file   No family history on file.  ASSESSMENT Recent Results: The most recent result is correlated with 70 mg per week: Lab Results  Component Value Date   INR 3.20 11/08/2017   INR 2.2 11/01/2017   INR 2.20 10/25/2017   PROTIME 28.8 (H) 02/04/2015    Anticoagulation Dosing: Description   Take one (1) tablet of your 10mg  white-colored warfarin tablets on all days.     INR today: Therapeutic  PLAN Weekly dose was unchanged.  Patient Instructions  Patient instructed to take medications as defined in the Anti-coagulation Track section of this encounter.  Patient instructed to take today's dose.  Patient instructed to take one (1) tablet of your 10mg  white-colored warfarin tablets by mouth, once-daily at [facility] each day.  Patient verbalized understanding of these instructions.     Patient advised to contact clinic or seek medical attention if signs/symptoms of bleeding or thromboembolism occur.  Patient verbalized understanding by repeating back information and was advised to contact me if further medication-related questions arise. Patient was also provided an information handout.  Follow-up Return in 2 weeks (on 11/22/2017) for Follow up INR at 4:15PM.  Pennie Banter  15 minutes spent face-to-face with the patient during the encounter. 50% of time spent on education. 50% of time was spent on fingerstick point of care INR sample collection, processing, results determination and documentation in [email or web address].

## 2017-11-08 NOTE — Patient Instructions (Signed)
Patient instructed to take medications as defined in the Anti-coagulation Track section of this encounter.  Patient instructed to take today's dose.  Patient instructed to take one (1) tablet of your 10mg  white-colored warfarin tablets by mouth, once-daily at [facility] each day.  Patient verbalized understanding of these instructions.

## 2017-11-15 ENCOUNTER — Telehealth: Payer: Self-pay | Admitting: Pharmacist

## 2017-11-15 NOTE — Telephone Encounter (Signed)
Left message:  Have rescheduled appointment in anticoagulation management clinic from 20-MAY-19 to Monday, 3-JUN-19 at regularly scheduled time.

## 2017-11-22 ENCOUNTER — Ambulatory Visit: Payer: Self-pay

## 2017-12-06 ENCOUNTER — Ambulatory Visit: Payer: Self-pay

## 2017-12-20 ENCOUNTER — Ambulatory Visit (INDEPENDENT_AMBULATORY_CARE_PROVIDER_SITE_OTHER): Payer: Self-pay | Admitting: Pharmacist

## 2017-12-20 DIAGNOSIS — Z7901 Long term (current) use of anticoagulants: Secondary | ICD-10-CM

## 2017-12-20 DIAGNOSIS — I2782 Chronic pulmonary embolism: Secondary | ICD-10-CM

## 2017-12-20 DIAGNOSIS — I2601 Septic pulmonary embolism with acute cor pulmonale: Secondary | ICD-10-CM

## 2017-12-20 LAB — POCT INR: INR: 2.5 (ref 2.0–3.0)

## 2017-12-20 NOTE — Patient Instructions (Signed)
Patient instructed to take medications as defined in the Anti-coagulation Track section of this encounter.  Patient instructed to take today's dose.  Patient instructed to take  one (1) tablet of your 10mg  white-colored warfarin tablets on ALL DAYS of week--EXCEPT on Mondays and Thursdays--take one and one-half (1 & 1/2 tablets) on Mondays and Thursdays. Patient verbalized understanding of these instructions.

## 2017-12-20 NOTE — Progress Notes (Signed)
Anticoagulation Management [name] [name] is a 57 y.o. male who reports to the clinic for monitoring of warfarin treatment.    Indication: PE , history of; Long term current use of anticoagulants.  Duration: indefinite Supervising physician: Murriel Hopper  Anticoagulation Clinic Visit History: Patient does not report signs/symptoms of bleeding or thromboembolism  Other recent changes: No diet, medications, lifestyle changes endorsed by the patient to me at this visit.  Anticoagulation Episode Summary    Current INR goal:   2.5-3.5  TTR:   38.5 % (6.5 y)  Next INR check:   11/22/2017  INR from last check:   2.5 (12/20/2017)  Weekly max warfarin dose:     Target end date:     INR check location:     Preferred lab:     Send INR reminders to:   [facility]   Indications   Pulmonary embolism ([place]) [I26.99]       Comments:         Anticoagulation Care Providers    Provider Role Specialty Phone number   Annia Belt, MD Referring Oncology [phone]      No Known Allergies Prior to Admission medications   Medication Sig Start Date End Date Taking? Authorizing Provider  amLODipine (NORVASC) 10 MG tablet Take 1 tablet (10 mg total) by mouth daily. 04/13/17 04/13/18 Yes Annia Belt, MD  aspirin 81 MG tablet Take 81 mg by mouth daily.   Yes [provider]  atorvastatin (LIPITOR) 10 MG tablet Take 1 tablet (10 mg total) by mouth daily. 04/13/17 04/13/18 Yes Annia Belt, MD  Coenzyme Q10-Fish Oil-Vit E (CO-Q 10 OMEGA-3 FISH OIL) CAPS Take 1 capsule by mouth 2 (two) times daily. 10/01/14  Yes Annia Belt, MD  triamterene-hydrochlorothiazide (DYAZIDE) 37.5-25 MG capsule Take 1 each (1 capsule total) by mouth daily. 04/13/17  Yes Annia Belt, MD  warfarin (COUMADIN) 10 MG tablet Take 10 mg daily by mouth.   Yes [provider]  enoxaparin (LOVENOX) 30 MG/0.3ML injection Take 180 mg every 24 hours. 05/10/17 05/19/17  Zada Finders,  MD   Past Medical History:  Diagnosis Date  . 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 Benign essential HTN 01/04/2012  . Chronic anticoagulation 01/02/2013  . Dermatofibroma of forearm 01/02/2013   Left side  . Hyperlipidemia, mixed 01/04/2012  . 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 Primary hypercoagulable state ([place]) 10/01/2014  . Sinus bradycardia, chronic 01/04/2012  . Superficial thrombosis of lower extremity 05/02/2012   Social History   Socioeconomic History  . Marital status: Married    Spouse name: Not on file  . Number of children: Not on file  . Years of education: Not on file  . Highest education level: Not on file  Occupational History  . Not on file  Social Needs  . Financial resource strain: Not on file  . Food insecurity:    Worry: Not on file    Inability: Not on file  . Transportation needs:    Medical: Not on file    Non-medical: Not on file  Tobacco Use  . Smoking status: Current Every Day Smoker    Packs/day: 0.50    Years: 30.00    Pack years: 15.00    Types: Cigarettes  . Smokeless tobacco: Never Used  . Tobacco  comment: Varies.  Substance and Sexual Activity  . Alcohol use: Yes    Alcohol/week: 0.0 oz    Comment: 1-2 times per week.  . Drug use: No  . Sexual activity: Not on file  Lifestyle  . Physical activity:    Days per week: Not on file    Minutes per session: Not on file  . Stress: Not on file  Relationships  . Social connections:    Talks on phone: Not on file    Gets together: Not on file    Attends religious service: Not on file    Active member of club or organization: Not on file    Attends meetings of clubs or organizations: Not on file    Relationship status: Not on file  Other Topics Concern  . Not on file  Social History Narrative  . Not on file    No family history on file.  ASSESSMENT Recent Results: The most recent result is correlated with 70 mg per week: Lab Results  Component Value Date   INR 2.5 12/20/2017   INR 3.20 11/08/2017   INR 2.2 11/01/2017   PROTIME 28.8 (H) 02/04/2015    Anticoagulation Dosing: Description   Take one (1) tablet of your 10mg  white-colored warfarin tablets on ALL DAYS of week--EXCEPT on Mondays and Thursdays--take one and one-half (1 & 1/2 tablets) on Mondays and Thursdays.      INR today: Therapeutic  PLAN Weekly dose was increased by 12% to 80 mg per week  Patient Instructions  Patient instructed to take medications as defined in the Anti-coagulation Track section of this encounter.  Patient instructed to take today's dose.  Patient instructed to take  one (1) tablet of your 10mg  white-colored warfarin tablets on ALL DAYS of week--EXCEPT on Mondays and Thursdays--take one and one-half (1 & 1/2 tablets) on Mondays and Thursdays. Patient verbalized understanding of these instructions.     Patient advised to contact clinic or seek medical attention if signs/symptoms of bleeding or thromboembolism occur.  Patient verbalized understanding by repeating back information and was advised to contact me if further medication-related questions arise. Patient was also provided an information handout.  Follow-up Return in about 1 month (around 01/17/2018) for Follow up INR at 4:15PM.  Pennie Banter, PharmD, CACP, CPP  15 minutes spent face-to-face with the patient during the encounter. 50% of time spent on education. 50% of time was spent on fingerstick point of care INR sample collection, processing, results determination, dose adjustment and documentation in [email or web address].

## 2017-12-20 NOTE — Progress Notes (Signed)
Reviewed thx DrG 

## 2018-01-17 ENCOUNTER — Ambulatory Visit: Payer: Self-pay

## 2018-02-28 ENCOUNTER — Encounter (INDEPENDENT_AMBULATORY_CARE_PROVIDER_SITE_OTHER): Payer: Self-pay

## 2018-02-28 ENCOUNTER — Ambulatory Visit (INDEPENDENT_AMBULATORY_CARE_PROVIDER_SITE_OTHER): Payer: Self-pay | Admitting: Pharmacist

## 2018-02-28 DIAGNOSIS — Z5181 Encounter for therapeutic drug level monitoring: Secondary | ICD-10-CM

## 2018-02-28 DIAGNOSIS — Z7901 Long term (current) use of anticoagulants: Secondary | ICD-10-CM

## 2018-02-28 DIAGNOSIS — I2699 Other pulmonary embolism without acute cor pulmonale: Secondary | ICD-10-CM

## 2018-02-28 DIAGNOSIS — I2782 Chronic pulmonary embolism: Secondary | ICD-10-CM

## 2018-02-28 DIAGNOSIS — I2601 Septic pulmonary embolism with acute cor pulmonale: Secondary | ICD-10-CM

## 2018-02-28 LAB — POCT INR: INR: 2 (ref 2.0–3.0)

## 2018-02-28 NOTE — Progress Notes (Signed)
Anticoagulation Management [name] [name] is a 57 y.o. male who reports to the clinic for monitoring of warfarin treatment.    Indication: PE, history of (resolved); Long term current use of anticoagulant.   Duration: indefinite Supervising physician: Murriel Hopper  Anticoagulation Clinic Visit History: Patient does not report signs/symptoms of bleeding or thromboembolism  Other recent changes: No diet, medications, lifestyle changes endorsed.  Anticoagulation Episode Summary    Current INR goal:   2.5-3.5  TTR:   37.4 % (6.7 y)  Next INR check:   03/28/2018  INR from last check:   2.0! (02/28/2018)  Weekly max warfarin dose:     Target end date:     INR check location:     Preferred lab:     Send INR reminders to:   ANTICOAG IMP   Indications   Pulmonary embolism ([place]) [I26.99]       Comments:         Anticoagulation Care Providers    Provider Role Specialty Phone number   Annia Belt, MD Referring Oncology [phone]      No Known Allergies Prior to Admission medications   Medication Sig Start Date End Date Taking? Authorizing Provider  amLODipine (NORVASC) 10 MG tablet Take 1 tablet (10 mg total) by mouth daily. 04/13/17 04/13/18 Yes Annia Belt, MD  aspirin 81 MG tablet Take 81 mg by mouth daily.   Yes [provider]  atorvastatin (LIPITOR) 10 MG tablet Take 1 tablet (10 mg total) by mouth daily. 04/13/17 04/13/18 Yes Annia Belt, MD  Coenzyme Q10-Fish Oil-Vit E (CO-Q 10 OMEGA-3 FISH OIL) CAPS Take 1 capsule by mouth 2 (two) times daily. 10/01/14  Yes Annia Belt, MD  triamterene-hydrochlorothiazide (DYAZIDE) 37.5-25 MG capsule Take 1 each (1 capsule total) by mouth daily. 04/13/17  Yes Annia Belt, MD  warfarin (COUMADIN) 10 MG tablet Take 10 mg daily by mouth.   Yes [provider]  enoxaparin (LOVENOX) 30 MG/0.3ML injection Take 180 mg every 24 hours. 05/10/17 05/19/17  Zada Finders, MD   Past Medical  History:  Diagnosis Date  . 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 Benign essential HTN 01/04/2012  . Chronic anticoagulation 01/02/2013  . Dermatofibroma of forearm 01/02/2013   Left side  . Hyperlipidemia, mixed 01/04/2012  . 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 Primary hypercoagulable state ([place]) 10/01/2014  . Sinus bradycardia, chronic 01/04/2012  . Superficial thrombosis of lower extremity 05/02/2012   Social History   Socioeconomic History  . Marital status: Married    Spouse name: Not on file  . Number of children: Not on file  . Years of education: Not on file  . Highest education level: Not on file  Occupational History  . Not on file  Social Needs  . Financial resource strain: Not on file  . Food insecurity:    Worry: Not on file    Inability: Not on file  . Transportation needs:    Medical: Not on file    Non-medical: Not on file  Tobacco Use  . Smoking status: Current Every Day Smoker    Packs/day: 0.50    Years: 30.00    Pack years: 15.00    Types: Cigarettes  . Smokeless tobacco: Never Used  . Tobacco comment: Varies.  Substance and Sexual Activity  .  Alcohol use: Yes    Alcohol/week: 0.0 standard drinks    Comment: 1-2 times per week.  . Drug use: No  . Sexual activity: Not on file  Lifestyle  . Physical activity:    Days per week: Not on file    Minutes per session: Not on file  . Stress: Not on file  Relationships  . Social connections:    Talks on phone: Not on file    Gets together: Not on file    Attends religious service: Not on file    Active member of club or organization: Not on file    Attends meetings of clubs or organizations: Not on file    Relationship status: Not on file  Other Topics Concern  . Not on file  Social History Narrative  . Not on file   No  family history on file.  ASSESSMENT Recent Results: The most recent result is correlated with 80 mg per week: Lab Results  Component Value Date   INR 2.0 02/28/2018   INR 2.5 12/20/2017   INR 3.20 11/08/2017   PROTIME 28.8 (H) 02/04/2015    Anticoagulation Dosing: Description   Take one (1) tablet of your 10mg  white-colored warfarin tablets on ALL DAYS of week--EXCEPT on Mondays, Wednesdays and Fridays take one and one-half (1 & 1/2 tablets) on Mondays, Wednesdays and Fridays.       INR today: Subtherapeutic  PLAN Weekly dose was increased by 6% to 85 mg per week  Patient Instructions  Patient instructed to take medications as defined in the Anti-coagulation Track section of this encounter.  Patient instructed to take today's dose.  Patient instructed to take one (1) tablet of your 10mg  white-colored warfarin tablets on ALL DAYS of week--EXCEPT on Mondays, Wednesdays and Fridays take one and one-half (1 & 1/2 tablets) on Mondays, Wednesdays and Fridays.  Patient verbalized understanding of these instructions.     Patient advised to contact clinic or seek medical attention if signs/symptoms of bleeding or thromboembolism occur.  Patient verbalized understanding by repeating back information and was advised to contact me if further medication-related questions arise. Patient was also provided an information handout.  Follow-up Return in 4 weeks (on 03/28/2018) for Follow up INR at  [place], PharmD, CPP  15 minutes spent face-to-face with the patient during the encounter. 50% of time spent on education. 50% of time was spent on fingerstick point of care INR sample collection, processing, results determination, dose adjustment, and documentation in [email or web address].

## 2018-02-28 NOTE — Progress Notes (Signed)
Reviewed. I never understand why someone who has been therapeutic on one dose for so long suddenly needs more. thx DrG

## 2018-02-28 NOTE — Patient Instructions (Signed)
Patient instructed to take medications as defined in the Anti-coagulation Track section of this encounter.  Patient instructed to take today's dose.  Patient instructed to take one (1) tablet of your 10mg  white-colored warfarin tablets on ALL DAYS of week--EXCEPT on Mondays, Wednesdays and Fridays take one and one-half (1 & 1/2 tablets) on Mondays, Wednesdays and Fridays.  Patient verbalized understanding of these instructions.

## 2018-03-28 ENCOUNTER — Ambulatory Visit: Payer: Self-pay

## 2018-04-08 ENCOUNTER — Other Ambulatory Visit: Payer: Self-pay | Admitting: Oncology

## 2018-04-08 DIAGNOSIS — I825Y1 Chronic embolism and thrombosis of unspecified deep veins of right proximal lower extremity: Secondary | ICD-10-CM

## 2018-04-08 DIAGNOSIS — Z7901 Long term (current) use of anticoagulants: Secondary | ICD-10-CM

## 2018-04-08 DIAGNOSIS — D6859 Other primary thrombophilia: Secondary | ICD-10-CM

## 2018-04-08 DIAGNOSIS — E782 Mixed hyperlipidemia: Secondary | ICD-10-CM

## 2018-04-08 DIAGNOSIS — I2782 Chronic pulmonary embolism: Secondary | ICD-10-CM

## 2018-04-08 DIAGNOSIS — D751 Secondary polycythemia: Secondary | ICD-10-CM

## 2018-06-27 ENCOUNTER — Other Ambulatory Visit: Payer: Self-pay | Admitting: Oncology

## 2018-06-27 DIAGNOSIS — I825Y1 Chronic embolism and thrombosis of unspecified deep veins of right proximal lower extremity: Secondary | ICD-10-CM

## 2018-06-27 DIAGNOSIS — Z7901 Long term (current) use of anticoagulants: Secondary | ICD-10-CM

## 2018-06-27 DIAGNOSIS — D751 Secondary polycythemia: Secondary | ICD-10-CM

## 2018-06-27 DIAGNOSIS — D6859 Other primary thrombophilia: Secondary | ICD-10-CM

## 2018-06-27 DIAGNOSIS — I2782 Chronic pulmonary embolism: Secondary | ICD-10-CM

## 2018-06-27 DIAGNOSIS — E782 Mixed hyperlipidemia: Secondary | ICD-10-CM

## 2018-06-30 ENCOUNTER — Other Ambulatory Visit: Payer: Self-pay | Admitting: Oncology

## 2018-06-30 DIAGNOSIS — I825Y1 Chronic embolism and thrombosis of unspecified deep veins of right proximal lower extremity: Secondary | ICD-10-CM

## 2018-06-30 DIAGNOSIS — I2601 Septic pulmonary embolism with acute cor pulmonale: Secondary | ICD-10-CM

## 2018-06-30 DIAGNOSIS — I743 Embolism and thrombosis of arteries of the lower extremities: Secondary | ICD-10-CM

## 2018-06-30 DIAGNOSIS — D6859 Other primary thrombophilia: Secondary | ICD-10-CM

## 2018-06-30 DIAGNOSIS — I82811 Embolism and thrombosis of superficial veins of right lower extremities: Secondary | ICD-10-CM

## 2018-06-30 DIAGNOSIS — D751 Secondary polycythemia: Secondary | ICD-10-CM

## 2018-06-30 DIAGNOSIS — Z7901 Long term (current) use of anticoagulants: Secondary | ICD-10-CM

## 2018-09-30 ENCOUNTER — Encounter: Payer: Self-pay | Admitting: *Deleted

## 2018-10-03 ENCOUNTER — Other Ambulatory Visit: Payer: Self-pay

## 2018-10-03 ENCOUNTER — Ambulatory Visit (INDEPENDENT_AMBULATORY_CARE_PROVIDER_SITE_OTHER): Payer: Medicaid Other | Admitting: Oncology

## 2018-10-03 ENCOUNTER — Encounter: Payer: Self-pay | Admitting: Oncology

## 2018-10-03 VITALS — BP 112/83 | HR 60 | Temp 97.5°F | Ht 75.0 in | Wt 265.1 lb

## 2018-10-03 DIAGNOSIS — R161 Splenomegaly, not elsewhere classified: Secondary | ICD-10-CM | POA: Diagnosis not present

## 2018-10-03 DIAGNOSIS — E785 Hyperlipidemia, unspecified: Secondary | ICD-10-CM

## 2018-10-03 DIAGNOSIS — Z86711 Personal history of pulmonary embolism: Secondary | ICD-10-CM | POA: Diagnosis not present

## 2018-10-03 DIAGNOSIS — I2609 Other pulmonary embolism with acute cor pulmonale: Secondary | ICD-10-CM

## 2018-10-03 DIAGNOSIS — R001 Bradycardia, unspecified: Secondary | ICD-10-CM

## 2018-10-03 DIAGNOSIS — I2782 Chronic pulmonary embolism: Secondary | ICD-10-CM | POA: Diagnosis not present

## 2018-10-03 DIAGNOSIS — Z8679 Personal history of other diseases of the circulatory system: Secondary | ICD-10-CM | POA: Diagnosis not present

## 2018-10-03 DIAGNOSIS — Z86718 Personal history of other venous thrombosis and embolism: Secondary | ICD-10-CM | POA: Diagnosis not present

## 2018-10-03 DIAGNOSIS — D6859 Other primary thrombophilia: Secondary | ICD-10-CM | POA: Diagnosis not present

## 2018-10-03 DIAGNOSIS — E782 Mixed hyperlipidemia: Secondary | ICD-10-CM | POA: Diagnosis not present

## 2018-10-03 DIAGNOSIS — I739 Peripheral vascular disease, unspecified: Secondary | ICD-10-CM | POA: Diagnosis not present

## 2018-10-03 DIAGNOSIS — I825Y1 Chronic embolism and thrombosis of unspecified deep veins of right proximal lower extremity: Secondary | ICD-10-CM | POA: Diagnosis not present

## 2018-10-03 DIAGNOSIS — Z9114 Patient's other noncompliance with medication regimen: Secondary | ICD-10-CM | POA: Diagnosis not present

## 2018-10-03 DIAGNOSIS — Z79899 Other long term (current) drug therapy: Secondary | ICD-10-CM | POA: Diagnosis not present

## 2018-10-03 DIAGNOSIS — R590 Localized enlarged lymph nodes: Secondary | ICD-10-CM

## 2018-10-03 DIAGNOSIS — I82811 Embolism and thrombosis of superficial veins of right lower extremities: Secondary | ICD-10-CM | POA: Diagnosis not present

## 2018-10-03 DIAGNOSIS — R221 Localized swelling, mass and lump, neck: Secondary | ICD-10-CM

## 2018-10-03 DIAGNOSIS — R230 Cyanosis: Secondary | ICD-10-CM

## 2018-10-03 DIAGNOSIS — D751 Secondary polycythemia: Secondary | ICD-10-CM | POA: Diagnosis not present

## 2018-10-03 DIAGNOSIS — Z9119 Patient's noncompliance with other medical treatment and regimen: Secondary | ICD-10-CM | POA: Diagnosis not present

## 2018-10-03 DIAGNOSIS — F17210 Nicotine dependence, cigarettes, uncomplicated: Secondary | ICD-10-CM | POA: Diagnosis not present

## 2018-10-03 DIAGNOSIS — Z7901 Long term (current) use of anticoagulants: Secondary | ICD-10-CM

## 2018-10-03 DIAGNOSIS — I10 Essential (primary) hypertension: Secondary | ICD-10-CM | POA: Diagnosis not present

## 2018-10-03 DIAGNOSIS — C760 Malignant neoplasm of head, face and neck: Secondary | ICD-10-CM

## 2018-10-03 LAB — PROTIME-INR
INR: 2.3 — ABNORMAL HIGH (ref 0.8–1.2)
PROTHROMBIN TIME: 24.8 s — AB (ref 11.4–15.2)

## 2018-10-03 NOTE — Patient Instructions (Signed)
To lab today Schedule CT neck and chest to be done before next MD visit Please establish in [facility] for general med problems and coumadin monitoring Return visit 2 weeks

## 2018-10-03 NOTE — Progress Notes (Signed)
CT chest/soft tissue neck scheduled next Monday 3/23 @ 1500PM, arrival time  1445PM,pt informed.

## 2018-10-03 NOTE — Progress Notes (Signed)
Hematology and Oncology Follow Up Visit  [name] [name] [id number] [birth year] 58 y.o. 10/03/2018 3:06 PM   Principle Diagnosis: Encounter Diagnoses  Name Primary?  . 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 Other chronic pulmonary embolism with acute cor pulmonale ([place])   . Superficial thrombosis of right lower extremity   . Primary hypercoagulable state ([place]) Yes  . Chronic anticoagulation   . Polycythemia secondary to smoking   . Benign essential HTN   . Hyperlipidemia, mixed   . Sinus bradycardia, chronic   . Malignant neoplasm of head, face and neck ([place])   . Localized swelling, mass or lump of neck   Clinical summary Poorly compliant 58 year old man I have followed for many years.  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  He is on chronic warfarin anticoagulation.  He has secondary polycythemia and continues to smoke.  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  This appeared partially thrombosed on a June 18, 2014 study.  He called the office on October 15 to complain of acute onset of pain in the right medial thigh.  He attributed this to a recurrent clot.  We had him come in for lab and a vascular Doppler study.  He showed up for the lab.  His INR was 2.9 on Coumadin 10 mg daily.  A d-dimer was mildly elevated at 0.56.  He states he was not informed that he was supposed to have a Doppler study that day and went home with some Lovenox samples.  When I called him that evening to report his warfarin was therapeutic and that he needed to get back for the Doppler study, he did not answer my call.  Fortunately, he was contacted by the ultrasound technician and did have the study done on October 19.  This did not show any recurrent venous thrombosis but it did show acute thrombus throughout the superficial femoral artery.  I was out of town.  My coverage  evaluated the patient.  His pain had resolved on the Lovenox.  On exam there was no palpable dorsalis pedis pulse on the right.  A posterior tibial pulse was present by Doppler study.  Normal pulses on the left foot.  Sharee Holster appearance of the skin of the toes bilaterally but foot was warm. He was put back on warfarin and a vascular surgery referral was made.  He was seen in November 2018.  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  This appeared to have chronic thrombus in it.  Right ABI 0.63.  His pain had resolved and he was advised to follow-up with vascular surgery in 1 year.  He did not.  Interim History:   Follow-up visit for this noncompliant 58 year old man with an idiopathic coagulopathy status post deep and superficial venous thrombosis and pulmonary emboli on chronic warfarin anticoagulation. He stopped his prophylactic aspirin, statin, and 1 of his blood pressure medications. He denies any dyspnea, chest pain, or palpitations. No recurrent leg pain or recurrent superficial phlebitis since last evaluation. He has noted a hard lump upper left neck.  Minimal pain on swallowing but it but swallowing sometimes associated with a headache.  Denies dysphagia for liquids or solids. He is still smoking 1/2 pack cigarettes daily.  Drinking beer.  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  Trying to apply for Medicaid.  Weight up to  265 pounds but overall stable compared with values from November 2018.  Medications: reviewed  Allergies: No Known Allergies  Review of Systems: See interim history Remaining ROS negative:   Physical Exam: Blood pressure 112/83, pulse 60, temperature (!) 97.5 F (36.4 C), temperature source Oral, height 6\' 3"  (1.905 m), weight 265 lb 1.6 oz (120.2 kg), SpO2 98 %. Wt Readings from Last 3 Encounters:  10/03/18 265 lb 1.6 oz (120.2 kg)  11/01/17 269 lb (122 kg)  06/03/17 266 lb 8 oz (120.9 kg)     General appearance: Well-nourished Caucasian man.  Ruddy  complexion.  Cyanotic lips. HENNT: Pharynx no erythema, exudate, mass, or ulcer. No thyromegaly or thyroid nodules Lymph nodes: Hard barely movable lymph no palpable high left neck anterior sternocleidomastoid border, no  supraclavicular, or axillary lymphadenopathy Breasts:  Lungs: Clear to auscultation, resonant to percussion throughout Heart: Irregular rhythm, bigeminy with intermittent regular rhythm, no murmur, no gallop, no rub, no click, no edema Abdomen: Soft, nontender, normal bowel sounds, no mass, spleen enlarged approximately 10 cm below left costal margin  Extremities: No edema, no calf tenderness Musculoskeletal: no joint deformities GU:  Vascular: Carotid pulses 2+, no bruits, distal pulses: Right popliteal pulse trace to 1+, left popliteal pulse 2+, dorsalis pedis 2+ left, barely palpable on the right feet cyanotic Neurologic: Alert, oriented, PERRLA, optic discs sharp and vessels normal, no hemorrhage or exudate, cranial nerves grossly normal, motor strength 5 over 5, reflexes 1+ symmetric, upper body coordination normal, gait normal, Skin: No rash or ecchymosis.  Cyanotic lips.  Cyanotic extremities.  Patchy cyanosis on the skin of the abdomen  Lab Results: CBC W/Diff    Component Value Date/Time   WBC 4.4 11/01/2017 1612   RBC 5.70 11/01/2017 1612   HGB 18.4 (H) 11/01/2017 1612   HGB 17.6 05/14/2015 1159   HGB 17.8 (H) 05/21/2014 1228   HCT 53.3 (H) 11/01/2017 1612   HCT 51.1 (H) 05/14/2015 1159   HCT 50.9 (H) 05/21/2014 1228   PLT 144 (L) 11/01/2017 1612   PLT 177 05/14/2015 1159   MCV 93.5 11/01/2017 1612   MCV 90 05/14/2015 1159   MCV 90.9 05/21/2014 1228   MCH 32.3 11/01/2017 1612   MCHC 34.5 11/01/2017 1612   RDW 14.1 11/01/2017 1612   RDW 14.6 05/14/2015 1159   RDW 13.6 05/21/2014 1228   LYMPHSABS 1.4 11/01/2017 1612   LYMPHSABS 1.6 05/14/2015 1159   LYMPHSABS 1.8 05/21/2014 1228   MONOABS 0.8 11/01/2017 1612   MONOABS 0.6 05/21/2014 1228   EOSABS 0.1  11/01/2017 1612   EOSABS 0.2 05/14/2015 1159   EOSABS 0.2 10/15/2011 0224   EOSABS 0.1 03/22/2009 1236   BASOSABS 0.0 11/01/2017 1612   BASOSABS 0.0 05/14/2015 1159   BASOSABS 0.0 05/21/2014 1228     Chemistry      Component Value Date/Time   NA 139 05/25/2017 1641   NA 140 05/14/2015 1159   NA 141 05/21/2014 1226   K 4.9 05/25/2017 1641   K 4.3 05/21/2014 1226   CL 104 05/25/2017 1641   CL 108 (H) 12/28/2012 1405   CO2 28 05/25/2017 1641   CO2 28 05/21/2014 1226   BUN 12 05/25/2017 1641   BUN 12 05/14/2015 1159   BUN 13.2 05/21/2014 1226   CREATININE 0.96 05/25/2017 1641   CREATININE 1.00 09/24/2014 1413   CREATININE 0.9 05/21/2014 1226      Component Value Date/Time   CALCIUM 9.7 05/25/2017 1641   CALCIUM 9.3 05/21/2014 1226  ALKPHOS 69 04/13/2017 0955   ALKPHOS 87 01/08/2014 1203   AST 41 04/13/2017 0955   AST 50 (H) 01/08/2014 1203   ALT 47 04/13/2017 0955   ALT 83 (H) 01/08/2014 1203   BILITOT 0.9 04/13/2017 0955   BILITOT 0.9 05/14/2015 1159   BILITOT 0.67 01/08/2014 1203       Radiological Studies: No results found.  Impression:  1.  Enlarged left high cervical lymph node suspicious for malignancy in a chronic cigarette smoker. I will get a CT scan of his neck and a high resolution CT scan of his chest.  If node confirmed and appears suspicious, then refer for biopsy.  2.  Idiopathic hypercoagulable state on chronic anticoagulation with warfarin. Status post pulmonary emboli, lower extremity DVTs, and recurrent superficial thrombophlebitis. Poor compliance with lab monitoring.  Not a good candidate for a new oral anticoagulant either due to expense.  He has been stable on a 10 mg daily dose for years.  I will continue the same.  3.  Secondary polycythemia likely related to ongoing tobacco abuse. He is not motivated to stop smoking or drinking.  4.  Essential hypertension. Blood pressure remarkably normal today on amlodipine alone.  5.  Chronic  asymptomatic sinus bradycardia  6.  Hyperlipidemia: Noncompliant with meds although he says he is still taking fish oil capsules.  7.  Ongoing tobacco abuse  8.  Cyanosis in the absence of hypoxia.  Puzzling.  Not on anything that would provoke methemoglobinemia.  9.  Peripheral vascular disease  10.  Thrombosed right popliteal aneurysm  11.  History of transient atrial fibrillation in 2013.  No recurrence.  On anticoagulation for other reasons.  This should cover PAF.   He has agreed to establish with our general internal medicine clinic.  CC: Patient Care Team: Patient, No Pcp Per as PCP - General (General Practice) Annia Belt, MD as Consulting Physician (Oncology)   Murriel Hopper, MD, [place]  Hematology-Oncology/Internal Medicine     3/16/20203:06 PM

## 2018-10-04 ENCOUNTER — Telehealth: Payer: Self-pay | Admitting: *Deleted

## 2018-10-04 LAB — CBC WITH DIFFERENTIAL/PLATELET
BASOS: 0 %
Basophils Absolute: 0 10*3/uL (ref 0.0–0.2)
EOS (ABSOLUTE): 0.1 10*3/uL (ref 0.0–0.4)
EOS: 1 %
Hematocrit: 52.2 % — ABNORMAL HIGH (ref 37.5–51.0)
Hemoglobin: 18.7 g/dL — ABNORMAL HIGH (ref 13.0–17.7)
IMMATURE GRANS (ABS): 0 10*3/uL (ref 0.0–0.1)
Immature Granulocytes: 0 %
Lymphocytes Absolute: 1.1 10*3/uL (ref 0.7–3.1)
Lymphs: 15 %
MCH: 32 pg (ref 26.6–33.0)
MCHC: 35.8 g/dL — ABNORMAL HIGH (ref 31.5–35.7)
MCV: 89 fL (ref 79–97)
Monocytes Absolute: 0.8 10*3/uL (ref 0.1–0.9)
Monocytes: 12 %
Neutrophils Absolute: 5.2 10*3/uL (ref 1.4–7.0)
Neutrophils: 72 %
Platelets: 230 10*3/uL (ref 150–450)
RBC: 5.85 x10E6/uL — ABNORMAL HIGH (ref 4.14–5.80)
RDW: 14 % (ref 11.6–15.4)
WBC: 7.3 10*3/uL (ref 3.4–10.8)

## 2018-10-04 LAB — COMPREHENSIVE METABOLIC PANEL
ALT: 31 IU/L (ref 0–44)
AST: 33 IU/L (ref 0–40)
Albumin/Globulin Ratio: 1.5 (ref 1.2–2.2)
Albumin: 4.3 g/dL (ref 3.8–4.9)
Alkaline Phosphatase: 93 IU/L (ref 39–117)
BUN/Creatinine Ratio: 14 (ref 9–20)
BUN: 15 mg/dL (ref 6–24)
Bilirubin Total: 0.8 mg/dL (ref 0.0–1.2)
CO2: 23 mmol/L (ref 20–29)
Calcium: 9.9 mg/dL (ref 8.7–10.2)
Chloride: 99 mmol/L (ref 96–106)
Creatinine, Ser: 1.1 mg/dL (ref 0.76–1.27)
GFR calc Af Amer: 85 mL/min/{1.73_m2} (ref >59)
GFR calc non Af Amer: 74 mL/min/{1.73_m2} (ref >59)
GLUCOSE: 85 mg/dL (ref 65–99)
Globulin, Total: 2.9 g/dL (ref 1.5–4.5)
Potassium: 4.5 mmol/L (ref 3.5–5.2)
Sodium: 142 mmol/L (ref 134–144)
Total Protein: 7.2 g/dL (ref 6.0–8.5)

## 2018-10-04 LAB — LIPID PANEL
Chol/HDL Ratio: 5.3 ratio — ABNORMAL HIGH (ref 0.0–5.0)
Cholesterol, Total: 171 mg/dL (ref 100–199)
HDL: 32 mg/dL — ABNORMAL LOW (ref >39)
LDL Calculated: 96 mg/dL (ref 0–99)
Triglycerides: 213 mg/dL — ABNORMAL HIGH (ref 0–149)
VLDL Cholesterol Cal: 43 mg/dL — ABNORMAL HIGH (ref 5–40)

## 2018-10-04 NOTE — Telephone Encounter (Signed)
Pt called / informed "lab results pretty good. Triglycerides (@ 213) still elevated on fish oil capsules alone. If he wants to get rid of his blue lips & skin, he has to stop smoking - he is choking off his blood oxygen supply and as a result blood count getting dangerously high putting him at risks for more blood clots including heart attack and stroke. Would he be interested in a nicotine patch? " per Dr Beryle Beams. He stated he will try doubling up on the fish oil and he has something at home to help him stop smoking (type of nicotine capsule), which he will try. And will see Dr Darnell Level on the 30th.

## 2018-10-04 NOTE — Telephone Encounter (Signed)
-----   Message from Annia Belt, MD sent at 10/04/2018  8:30 AM EDT ----- Call pt: lab results pretty good. Triglycerides still elevated on fish oil capsules alone. If he wants to get rid of his blue lips & skin, he has to stop smoking - he is choking off his blood oxygen supply and as a result blood count getting dangerously high putting him at risks for more blood clots including heart attack and stroke. Would he be interested in a nicotine patch? I can send Rx to his pharmacy - find out where he is currently getting his Rxs.

## 2018-10-04 NOTE — Telephone Encounter (Signed)
Noted - thanks DrG

## 2018-10-10 ENCOUNTER — Ambulatory Visit (HOSPITAL_COMMUNITY)
Admission: RE | Admit: 2018-10-10 | Discharge: 2018-10-10 | Disposition: A | Discharge status: Home / Self Care | Payer: Medicaid Other | Source: Ambulatory Visit | Attending: Oncology | Admitting: Oncology

## 2018-10-10 ENCOUNTER — Other Ambulatory Visit: Payer: Self-pay

## 2018-10-10 ENCOUNTER — Other Ambulatory Visit: Payer: Self-pay | Admitting: Oncology

## 2018-10-10 DIAGNOSIS — R221 Localized swelling, mass and lump, neck: Secondary | ICD-10-CM | POA: Insufficient documentation

## 2018-10-10 DIAGNOSIS — C760 Malignant neoplasm of head, face and neck: Secondary | ICD-10-CM | POA: Insufficient documentation

## 2018-10-10 MED ORDER — IOHEXOL 300 MG/ML  SOLN
100.0000 mL | Freq: Once | INTRAMUSCULAR | Status: AC | PRN
  Administered 2018-10-10: 100 mL via INTRAVENOUS

## 2018-10-11 ENCOUNTER — Other Ambulatory Visit: Payer: Self-pay | Admitting: Oncology

## 2018-10-11 DIAGNOSIS — R220 Localized swelling, mass and lump, head: Principal | ICD-10-CM

## 2018-10-11 DIAGNOSIS — R221 Localized swelling, mass and lump, neck: Secondary | ICD-10-CM

## 2018-10-11 DIAGNOSIS — J358 Other chronic diseases of tonsils and adenoids: Secondary | ICD-10-CM

## 2018-10-13 ENCOUNTER — Encounter (HOSPITAL_BASED_OUTPATIENT_CLINIC_OR_DEPARTMENT_OTHER): Payer: Self-pay

## 2018-10-13 ENCOUNTER — Other Ambulatory Visit: Payer: Self-pay | Admitting: Otolaryngology

## 2018-10-13 ENCOUNTER — Other Ambulatory Visit: Payer: Self-pay

## 2018-10-13 ENCOUNTER — Telehealth: Payer: Self-pay | Admitting: *Deleted

## 2018-10-13 NOTE — Telephone Encounter (Signed)
Oncology Nurse Navigator Documentation  Confirmed with ENT Te[facility]'s office referral from Dr. Beryle Beams received.  Pt scheduled for bx 10/19/2018. Drs. Granfortuna and Zhao updated.  Gayleen Orem, RN, BSN Head & Neck Oncology Nurse [place] at [place] [phone]

## 2018-10-14 ENCOUNTER — Telehealth: Payer: Self-pay | Admitting: *Deleted

## 2018-10-14 ENCOUNTER — Other Ambulatory Visit: Payer: Self-pay | Admitting: *Deleted

## 2018-10-14 DIAGNOSIS — D4989 Neoplasm of unspecified behavior of other specified sites: Secondary | ICD-10-CM

## 2018-10-14 NOTE — Telephone Encounter (Signed)
Oncology Nurse Navigator Documentation  Mr. [name] [name] returned call.    Introduced myself as the H&N oncology nurse navigator that works with Drs. Isidore Moos and Maylon Peppers to whom he has been referred by Dr. Beryle Beams.    He confirmed understanding of referrals and appt with Dr. Benjamine Mola 4/1 for tonsil bx.  I explained I will be coordinating appts for PET, Drs. Tillie Fantasia.  I explained the purpose of a dental evaluation prior to starting RT, indicated he wd be contacted by Dental Medicine  to arrange an appt within a day or so of his appt with Dr. Isidore Moos.   Confirmed understanding of [place] and [facility] location, explained arrival and registration process.  Provided my contact information, encouraged him to call with questions/concerns.  He verbalized understanding of information provided.  Navigator Needs Assessment . Employment status:  Film/video editor, working PT. . Support system:  Significant other. . Transportation:  Yes . PCP: Dr. Murriel Hopper; aware of cancer dx. Marland Kitchen PCD: No, couple of years since he saw a dentist.   Gayleen Orem, RN, BSN Head & Neck Oncology Nurse [place] at [place] [phone]

## 2018-10-14 NOTE — Telephone Encounter (Signed)
Oncology Nurse Navigator Documentation  Placed new referral introductory call to [name] [name].  [facility] asking for call-back.  Gayleen Orem, RN, BSN Head & Neck Oncology Nurse [place] at [place] [phone]

## 2018-10-16 NOTE — Telephone Encounter (Signed)
Oncology Nurse Navigator Documentation  Called Mr. Joss to confirm appts for next week:  3/30 1:30 Dr. Beryle Beams; 3/31 1:00 PET [facility] w/ 12:30 arrival, NPO 6 hrs prior, low carb meal prior to NPO; 4/1 biopsy with Dr. Benjamine Mola; 11:45 lab and 12:30 Dr. Maylon Peppers [facility].  I provided address of [facility], explained future appts will be scheduled at [facility].  I indicated appt with Dr. Isidore Moos, RadOnc, pending.  He voiced understanding of information.   He stated he had just received call from Dr. Deeann Saint office (?) indicating 4/1 bx had been cancelled.  I noted appt still evident in Epic, I would follow-up with Dr. Benjamine Mola on Monday.  Gayleen Orem, RN, BSN Head & Neck Oncology Nurse [place] at [place] [phone]

## 2018-10-17 ENCOUNTER — Other Ambulatory Visit: Payer: Self-pay | Admitting: Otolaryngology

## 2018-10-17 ENCOUNTER — Telehealth: Payer: Self-pay | Admitting: *Deleted

## 2018-10-17 ENCOUNTER — Other Ambulatory Visit: Payer: Self-pay | Admitting: Hematology

## 2018-10-17 ENCOUNTER — Ambulatory Visit: Payer: Self-pay | Admitting: Oncology

## 2018-10-17 DIAGNOSIS — C099 Malignant neoplasm of tonsil, unspecified: Secondary | ICD-10-CM

## 2018-10-17 DIAGNOSIS — C090 Malignant neoplasm of tonsillar fossa: Secondary | ICD-10-CM | POA: Insufficient documentation

## 2018-10-17 DIAGNOSIS — I825Y1 Chronic embolism and thrombosis of unspecified deep veins of right proximal lower extremity: Secondary | ICD-10-CM

## 2018-10-17 NOTE — Progress Notes (Signed)
[name] [name]  Patient Care Team: Patient, No Pcp Per as PCP - General ([place]) [name] Belt, MD as Consulting Physician (Oncology)  HEME/ONC OVERVIEW: 1. 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 Melene [name], results pending   2. 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 Ongoing tobacco and EtOH abuse 2. Paroxysmal A-fib 3. 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 Beryle [name] for the management of his chronic recurrent DVTs, and reported a new onset left neck lump.  CT neck showed prominence at the left tonsillar area, concerning for primary tonsillar malignancy, as well as at least two left Level II and one Level IV LN's, suspicious for malignant adenopathy.  There was no contralateral LN disease.  CT  chest showed questionable right hilar fullness, but otherwise did not show any definitive evidence of metastasis. PET showed FDG-avid left tonsil w/ left Level II nodal involvement but no evidence of metastatic disease. Patient underwent DL and left tonsillar biopsy on 10/19/2018 by Dr. Melene [name], the results of which are currently pending.  -I reviewed the imaging and pathology results in detail with the patient -I also reviewed NCCN guidelines at length with the patient -We discussed in the multidisciplinary approach for head and neck cancers, including ENT, radiation oncology, and medical oncology.  We discussed that the treatment approach is individualized based on the specific case, and may involve various combinations of surgery, chemotherapy, and radiation. -I also discussed with the patient some of the benefits and potential toxicities of chemotherapy -Given the relatively low volume disease, upfront surgical resection, followed by adjuvant radiation with or without chemotherapy, is a very reasonable approach -Furthermore, in light of patient's multiple comorbidities, including ongoing tobacco/EtOH abuse, paroxysmal A. fib, peripheral vascular disease, and history of noncompliance, he would likely tolerate systemic chemotherapy poorly and is at high risk for adverse outcome -Patient expressed understanding and was leaning toward upfront surgical resection is he is a candidate   Recurrent DVT's of the RLE and remote hx of PTE in 1990's -Patient has been followed by Dr. Beryle [name] for many years -He is currently on warfarin, which he has had noncompliance issues in the past -I reviewed with the patient about the [name] for care for DVT -This last episode of blood clot appeared to be unprovoked. In the absence of major contraindications, the goal of anticoagulation therapy is lifelong.  -We discussed about various options of  anticoagulation therapies including warfarin, low molecular weight heparin such as  Lovenox or direct oral anticoagulants such as rivaroxaban and apixiban. Some of the risks and benefits discussed including costs involved, the need for monitoring, risks of life-threatening bleeding/hospitalization, reversibility of each agent in the event of bleeding or overdose, safety profile of each drug and taking into account other social issues such as ease of administration of medications, etc. Ultimately, the patient made the decision to continue warfarin for now -He will continue INR monitoring and warfarin dose adjustment per the Coumadin clinic  -I recommend the patient to use elastic compression stockings at 20-30 mmHg to reduce risks of chronic thrombophlebitis. -Finally, I reinforced the importance of preventive strategies such as avoiding hormonal supplement, avoiding cigarette smoking, keeping up-to-date with screening programs for early cancer detection, frequent ambulation for long distance travel and aggressive DVT prophylaxis in all surgical settings.  Secondary polycythemia -Secondary to EtOH and tobacco abuse -Hgb 18.1 today, stable -See discussion regarding EtOH and tobacco abuse below -In the setting of suspected secondary polycythemia, there is no indication for phlebotomy -We will monitor it for now  EtOH abuse -Patient is currently drinking 1/5 pint liquor/week as well as beer  -I strongly counseled the patient on the importance of avoiding EtOH use, which is a known risk factor for head and neck cancer -Patient expressed understanding, and was agreeable to start reducing his EtOH intake with the [name] to stop drinking over the next a few weeks   Tobacco abuse -I spent some time counseling the patient the importance of tobacco cessation. -We discussed common strategies including nicotine patches, Tobacco Quit-line, and other nicotine replacement products to assist in the patient's effort to quit. -The patient is interested in quitting smoking and will begin reducing his  tobacco use  No orders of the defined types were placed in this encounter.  A total of more than 60 minutes were spent face-to-face with the patient during this encounter and over half of that time was spent on counseling and coordination of care as outlined above.    All questions were answered. The patient knows to call the clinic with any problems, questions or concerns.  Return to clinic to be determined, pending surgery evaluation at [place], MD 10/21/2018 1:08 PM   CHIEF COMPLAINTS/PURPOSE OF CONSULTATION:  "I am a little sore in my throat"  HISTORY OF PRESENTING ILLNESS:  [name] [name] 58 y.o. male is here because of newly diagnosed left tonsillar squamous cell carcinoma.  Patient has a history of recurrent DVTs on warfarin, for which he has been followed closely by Dr. Beryle [name], who recently retired.  Patient has now transitioned his care to the [facility] for the management of recurrent DVTs and suspected newly diagnosed a left tonsillar malignancy.  Patient reports that he developed a sore throat in 06/2018, for which he contacted his PCP and was prescribed a course of antibiotics.  Sore throat did not improve initially with the antibiotics, but slowly resolved over the next 2 weeks.  However, it continued to recur, prompting the patient to see Dr. Beryle [name] in 09/2018 for evaluation of an enlarging, hard lump in the left neck.  He denied any associated fever, chill, night sweats, weight change, dysphagia, or odynophagia.  He underwent CT neck, which showed enhancement of the left tonsillar region, suspicious for left tonsillar primary.  In addition, there were at least two Level II and one Level IV LN's that appeared abnormal,  but there was no contralateral disease.  The CT chest showed equivocal right hilar fullness, but did not otherwise show any evidence of thoracic metastasis.  PET in late 09/2018 showed FDG-avid left tonsil lesion with suspected  Level II left cervical adenopathy, but no evidence of metastatic disease.  He underwent left tonsil biopsy on 10/19/2018 by Dr. Melene [name], the results of which is pending.  He reports mild left throat soreness as well as some generalized muscle achiness since the procedure, but otherwise denies any other complaint today.  With regard to the patient's DVTs, he has been on warfarin since late 2000's after being diagnosed with PTE. He has a history of medication noncompliance.   He recently quit smoking but began vaping.  In addition, he also drinks 1/5 pint of liquor per week as well as beer.  He denies any illicit drug use.  I have reviewed his chart and materials related to his cancer extensively and collaborated history with the patient. Summary of oncologic history is as follows:   Tonsil cancer ([place])   10/10/2018 Imaging    CT neck w/ contrast: FINDINGS: Pharynx and larynx: There is asymmetric prominence of the left tonsillar region. No other mucosal or submucosal lesion is seen.  Salivary glands: Parotid and submandibular glands are normal.  Thyroid: Normal  Lymph nodes: Definite adenopathy on the left. 2 level nodes which shows some internal necrosis. The larger measures 2.2 cm in diameter and the smaller measures 1.4 cm in diameter. Suspicious level 4 node image 83 just anterior to the jugular vein measuring 9 mm short axis diameter. No worrisome nodes on the right.    10/10/2018 Imaging    CT chest:  IMPRESSION: 1. Mild right hilar adenopathy, equivocal for metastasis. No additional potential evidence of metastatic disease in the chest. PET-CT could be considered for further staging evaluation, as clinically warranted. 2. 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 Ascending thoracic aortic aneurysm. Recommend semi-annual imaging followup by CTA or MRA and referral to cardiothoracic surgery if not already obtained. This recommendation follows  2010 ACCF/AHA/AATS/ACR/ASA/SCA/SCAI/SIR/STS/SVM Guidelines for the Diagnosis and Management of Patients With Thoracic Aortic Disease. Circulation. 2010; 121: Y195-K932. Aortic aneurysm NOS (ICD10-I71.9).    10/17/2018 Initial Diagnosis    Tonsil cancer ([place])    10/18/2018 Imaging    PET: IMPRESSION: 1. 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 2. Local hypermetabolic nodal metastasis to the LEFT level II nodal station. 3. No contralateral nodal metastasis. 4. No evidence distant metastatic disease. 5. Two small pulmonary nodules in the LEFT lower lobe are favored benign. Recommend attention on follow-up. 6. Several small common iliac lymph nodes with low metabolic activity. Favor reactive adenopathy.     MEDICAL HISTORY:  Past Medical History:  Diagnosis Date  . 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 Benign essential HTN 01/04/2012  . Chronic anticoagulation 01/02/2013  . Dermatofibroma of forearm 01/02/2013   Left side  . Hyperlipidemia, mixed 01/04/2012  . 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 Primary hypercoagulable state ([place]) 10/01/2014  . Sinus bradycardia, chronic 01/04/2012  . Superficial thrombosis of lower extremity 05/02/2012    SURGICAL HISTORY: Past Surgical History:  Procedure Laterality Date  . DIRECT LARYNGOSCOPY Left 10/19/2018   Procedure: DIRECT LARYNGOSCOPY;  Surgeon: Leta Baptist, MD;  Location: [facility];  Service: ENT;  Laterality: Left;  . TONSILLECTOMY Left 10/19/2018   Procedure: BIOPSY OF LEFT TONSIL;  Surgeon: Leta Baptist, MD;  Location: [facility];  Service: ENT;  Laterality: Left;    SOCIAL HISTORY: Social History   Socioeconomic History  . Marital status: Married    Spouse name: Not on file  . Number of  children: Not on file  . Years of education: Not on file  . Highest education level: Not on file  Occupational History  . Not on file  Social Needs  . Financial resource strain: Not on file  . Food insecurity:    Worry: Not on file    Inability: Not on file  . Transportation needs:    Medical: Not on file    Non-medical: Not on file  Tobacco Use  . Smoking status: Former Smoker    Packs/day: 0.50    Years: 30.00    Pack years: 15.00    Types: Cigarettes    Last attempt to quit: 10/19/2018  . Smokeless tobacco: Never Used  Substance and Sexual Activity  . Alcohol use: Yes    Alcohol/week: 13.0 - 14.0 standard drinks    Types: 12 Cans of beer, 1 - 2 Shots of liquor per week    Comment: 1-2 times per week.  . Drug use: No  . Sexual activity: Not on file  Lifestyle  . Physical activity:    Days per week: Not on file    Minutes per session: Not on file  . Stress: Not on file  Relationships  . Social connections:    Talks on phone: Not on file    Gets together: Not on file    Attends religious service: Not on file    Active member of club or organization: Not on file    Attends meetings of clubs or organizations: Not on file    Relationship status: Not on file  . Intimate partner violence:    Fear of current or ex partner: Not on file    Emotionally abused: Not on file    Physically abused: Not on file    Forced sexual activity: Not on file  Other Topics Concern  . Not on file  Social History Narrative  . Not on file    FAMILY HISTORY: History reviewed. No pertinent family history.  ALLERGIES:  has No Known Allergies.  MEDICATIONS:  Current Outpatient Medications  Medication Sig Dispense Refill  . amLODipine (NORVASC) 10 MG tablet TAKE 1 TABLET BY MOUTH EVERY DAY 90 tablet 2  . oxyCODONE-acetaminophen (PERCOCET) 5-325 MG tablet Take 1 tablet by mouth every 4 (four) hours as needed for severe pain. 25 tablet 0   No current facility-administered medications for  this visit.     REVIEW OF SYSTEMS:   Constitutional: ( - ) fevers, ( - )  chills , ( - ) night sweats Eyes: ( - ) blurriness of vision, ( - ) double vision, ( - ) watery eyes Ears, nose, mouth, throat, and face: ( - ) mucositis, ( + ) sore throat Respiratory: ( - ) cough, ( - ) dyspnea, ( - ) wheezes Cardiovascular: ( - ) palpitation, ( - ) chest discomfort, ( - ) lower extremity swelling Gastrointestinal:  ( - ) nausea, ( - ) heartburn, ( - ) change in bowel habits Skin: ( - ) abnormal skin rashes Lymphatics: ( + ) new lymphadenopathy, ( - ) easy bruising Neurological: ( - ) numbness, ( - ) tingling, ( - )  new weaknesses Behavioral/Psych: ( - ) mood change, ( - ) new changes  All other systems were reviewed with the patient and are negative.  PHYSICAL EXAMINATION: ECOG PERFORMANCE STATUS: 1 - Symptomatic but completely ambulatory  Vitals:   10/21/18 1231  BP: 133/88  Pulse: 92  Resp: 20  Temp: 98 F (36.7 C)  SpO2: 97%   There were no vitals filed for this visit.  GENERAL: alert, no distress and comfortable, facial erythema, c/w polycythemia SKIN: skin color, texture, turgor are normal, no rashes or significant lesions EYES: conjunctiva are pink and non-injected, sclera clear OROPHARYNX: no exudate, no erythema; lips, buccal mucosa, and tongue normal  NECK: supple, non-tender LYMPH:  left cervical adenopathy ~1-2cm LUNGS: clear to auscultation with normal breathing effort HEART: regular rate & rhythm, no murmurs, no lower extremity edema ABDOMEN: soft, non-tender, non-distended, normal bowel sounds Musculoskeletal: no cyanosis of digits and no clubbing  PSYCH: alert & oriented x 3, fluent speech NEURO: no focal motor/sensory deficits  LABORATORY DATA:  I have reviewed the data as listed Lab Results  Component Value Date   WBC 7.2 10/21/2018   HGB 18.1 (H) 10/21/2018   HCT 51.6 10/21/2018   MCV 93.0 10/21/2018   PLT 199 10/21/2018   Lab Results  Component Value  Date   NA 138 10/21/2018   K 3.9 10/21/2018   CL 100 10/21/2018   CO2 28 10/21/2018    RADIOGRAPHIC STUDIES: I have personally reviewed the radiological images as listed and agreed with the findings in the report. Ct Soft Tissue Neck W Contrast  Result Date: 10/10/2018 CLINICAL DATA:  Localized swelling of the neck. EXAM: CT NECK WITH CONTRAST TECHNIQUE: Multidetector CT imaging of the neck was performed using the standard protocol following the bolus administration of intravenous contrast. CONTRAST:  154mL OMNIPAQUE IOHEXOL 300 MG/ML  SOLN COMPARISON:  None. FINDINGS: Pharynx and larynx: There is asymmetric prominence of the left tonsillar region. No other mucosal or submucosal lesion is seen. Salivary glands: Parotid and submandibular glands are normal. Thyroid: Normal Lymph nodes: Definite adenopathy on the left. 2 level nodes which shows some internal necrosis. The larger measures 2.2 cm in diameter and the smaller measures 1.4 cm in diameter. Suspicious level 4 node image 83 just anterior to the jugular vein measuring 9 mm short axis diameter. No worrisome nodes on the right. Vascular: No abnormal vascular finding. Limited intracranial: Normal Visualized orbits: Normal Mastoids and visualized paranasal sinuses: Clear except for an insignificant retention cyst in the inferior left maxillary sinus. Skeleton: Normal Upper chest: Early emphysema.  See results of chest CT. Other: None IMPRESSION: Probable primary mass in the left tonsillar region. Lymphadenopathy on the left with some internal necrosis. 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 Electronically Signed   By: Nelson Chimes M.D.   On: 10/10/2018 20:31   Ct Chest W Contrast  Result Date: 10/11/2018 CLINICAL DATA:  Head neck cancer. Left neck adenopathy. Current smoker. EXAM: CT CHEST WITH CONTRAST TECHNIQUE: Multidetector CT imaging of the chest was performed during intravenous contrast administration. CONTRAST:   129mL OMNIPAQUE IOHEXOL 300 MG/ML  SOLN COMPARISON:  10/15/2011 chest CT angiogram. FINDINGS: Cardiovascular: Normal heart size. No significant pericardial effusion/thickening. 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 Top-normal caliber main pulmonary artery (3.4 cm diameter). No central pulmonary emboli. Mediastinum/Nodes: No discrete thyroid nodules. Unremarkable esophagus. No pathologically enlarged axillary, mediastinal or hilar lymph nodes. Lungs/Pleura: No pneumothorax. No pleural effusion. Mild centrilobular and paraseptal emphysema with mild  diffuse bronchial wall thickening. A few scattered solid pulmonary nodules in both lungs, largest 6 mm in the left lower lobe (series 4/image 104), all stable since 10/15/2011 chest CT, considered benign. No acute consolidative airspace disease, lung masses or new significant pulmonary nodules. Parenchymal banding at lung bases bilaterally, compatible with postinfectious/postinflammatory scarring. Upper abdomen: No acute abnormality. Musculoskeletal: No aggressive appearing focal osseous lesions. Mild thoracic spondylosis. IMPRESSION: 1. Mild right hilar adenopathy, equivocal for metastasis. No additional potential evidence of metastatic disease in the chest. PET-CT could be considered for further staging evaluation, as clinically warranted. 2. 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 Ascending thoracic aortic aneurysm. Recommend semi-annual imaging followup by CTA or MRA and referral to cardiothoracic surgery if not already obtained. This recommendation follows 2010 ACCF/AHA/AATS/ACR/ASA/SCA/SCAI/SIR/STS/SVM Guidelines for the Diagnosis and Management of Patients With Thoracic Aortic Disease. Circulation. 2010; 121: F643-P295. Aortic aneurysm NOS (ICD10-I71.9). Emphysema (ICD10-J43.9). Electronically Signed   By: Ilona Sorrel M.D.   On: 10/11/2018 08:15   Nm Pet Image Initial (pi) Skull Base To Thigh  Result Date: 10/18/2018 CLINICAL DATA:  Subsequent treatment  strategy for LEFT neck lymphadenopathy EXAM: NUCLEAR MEDICINE PET SKULL BASE TO THIGH TECHNIQUE: 13.94 mCi F-18 FDG was injected intravenously. Full-ring PET imaging was performed from the skull base to thigh after the radiotracer. CT data was obtained and used for attenuation correction and anatomic localization. Fasting blood glucose: 118 mg/dl COMPARISON:  Neck CT 10/10/2018 FINDINGS: Mediastinal blood pool activity: SUV max 2.5 NECK: Elongated hypermetabolic thickening of the LEFT lingual tonsil with SUV max equal 14.3. Two adjacent hypermetabolic LEFT level II lymph nodes beneath the sternocleidomastoid muscle. The larger anterior node measures 18 mm with SUV max equal 10.6. Smaller posterior node measures 11 mm with SUV max equal 9.3. No contralateral hypermetabolic cervical lymph nodes. Incidental CT findings: none CHEST: No hypermetabolic mediastinal or hilar nodes. No suspicious pulmonary nodules on the CT scan. Incidental CT findings: 6 mm nodule in the LEFT lower lobe (image 46/8). Small adjacent LEFT lung base nodule measuring 5 mm (image 43/8). No associated metabolic activity the small lymph nodes pulmonary nodules. ABDOMEN/PELVIS: No no upper abdominal adenopathy. Normal liver pancreas spleen and kidneys. Small LEFT distal common iliac lymph node measuring 9 mm (image 190/4) has mild metabolic activity (SUV max equal 2.5) which is equal to background adjacent vascular activity (SUV max equal 2.5). Similar small lymph node in the RIGHT common iliac station (image 186/4. Prostate normal Incidental CT findings: none SKELETON: No focal hypermetabolic activity to suggest skeletal metastasis. Incidental CT findings: none IMPRESSION: 1. 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 2. Local hypermetabolic nodal metastasis to the LEFT level II nodal station. 3. No contralateral nodal metastasis. 4. No evidence distant metastatic disease. 5. Two small pulmonary nodules in the  LEFT lower lobe are favored benign. Recommend attention on follow-up. 6. Several small common iliac lymph nodes with low metabolic activity. Favor reactive adenopathy. Electronically Signed   By: Suzy Bouchard M.D.   On: 10/18/2018 15:48    PATHOLOGY: I have reviewed the pathology reports as documented in the oncologist history.

## 2018-10-17 NOTE — Telephone Encounter (Signed)
Oncology Nurse Navigator Documentation  Called Dr. Deeann Saint office to inquire about status of scheduled 4/1 biopsy in follow-up to patient's indication 3/27 it is being cancelled.  Spoke with Jeannetta Nap who stated Dr. Benjamine Mola is evaluating scheduled biopsies which he conducts at the [facility].  She agreed to call me with update s/p discussion with Dr. Benjamine Mola.  Drs. Granfortuna and Maylon Peppers updated in advance of patient's scheduled appts this week.  Gayleen Orem, RN, BSN Head & Neck Oncology Nurse [place] [facility] [phone]

## 2018-10-18 ENCOUNTER — Other Ambulatory Visit: Payer: Self-pay

## 2018-10-18 ENCOUNTER — Encounter (HOSPITAL_BASED_OUTPATIENT_CLINIC_OR_DEPARTMENT_OTHER): Payer: Self-pay | Attending: Otolaryngology

## 2018-10-18 ENCOUNTER — Ambulatory Visit (HOSPITAL_COMMUNITY)
Admission: RE | Admit: 2018-10-18 | Discharge: 2018-10-18 | Disposition: A | Discharge status: Home / Self Care | Payer: Medicaid Other | Source: Ambulatory Visit | Attending: Hematology | Admitting: Hematology

## 2018-10-18 DIAGNOSIS — Z8679 Personal history of other diseases of the circulatory system: Secondary | ICD-10-CM | POA: Diagnosis not present

## 2018-10-18 DIAGNOSIS — Z79899 Other long term (current) drug therapy: Secondary | ICD-10-CM | POA: Diagnosis not present

## 2018-10-18 DIAGNOSIS — I4510 Unspecified right bundle-branch block: Secondary | ICD-10-CM | POA: Diagnosis not present

## 2018-10-18 DIAGNOSIS — J359 Chronic disease of tonsils and adenoids, unspecified: Secondary | ICD-10-CM | POA: Diagnosis present

## 2018-10-18 DIAGNOSIS — Z7901 Long term (current) use of anticoagulants: Secondary | ICD-10-CM | POA: Diagnosis not present

## 2018-10-18 DIAGNOSIS — F17210 Nicotine dependence, cigarettes, uncomplicated: Secondary | ICD-10-CM | POA: Diagnosis not present

## 2018-10-18 DIAGNOSIS — Z86718 Personal history of other venous thrombosis and embolism: Secondary | ICD-10-CM | POA: Diagnosis not present

## 2018-10-18 DIAGNOSIS — D4989 Neoplasm of unspecified behavior of other specified sites: Secondary | ICD-10-CM | POA: Diagnosis not present

## 2018-10-18 DIAGNOSIS — C099 Malignant neoplasm of tonsil, unspecified: Secondary | ICD-10-CM | POA: Diagnosis not present

## 2018-10-18 DIAGNOSIS — E785 Hyperlipidemia, unspecified: Secondary | ICD-10-CM | POA: Diagnosis not present

## 2018-10-18 DIAGNOSIS — I4891 Unspecified atrial fibrillation: Secondary | ICD-10-CM | POA: Diagnosis not present

## 2018-10-18 DIAGNOSIS — I10 Essential (primary) hypertension: Secondary | ICD-10-CM | POA: Diagnosis not present

## 2018-10-18 LAB — GLUCOSE, CAPILLARY: Glucose-Capillary: 118 mg/dL — ABNORMAL HIGH (ref 70–99)

## 2018-10-18 LAB — PROTIME-INR
INR: 1.4 — ABNORMAL HIGH (ref 0.8–1.2)
Prothrombin Time: 16.6 seconds — ABNORMAL HIGH (ref 11.4–15.2)

## 2018-10-18 MED ORDER — FLUDEOXYGLUCOSE F - 18 (FDG) INJECTION
13.9400 | Freq: Once | INTRAVENOUS | Status: AC | PRN
  Administered 2018-10-18: 13.94 via INTRAVENOUS

## 2018-10-18 NOTE — Anesthesia Preprocedure Evaluation (Addendum)
Anesthesia Evaluation  Patient identified by MRN, date of birth, ID band Patient awake    Reviewed: Allergy & Precautions, H&P , NPO status , Patient's Chart, lab work & pertinent test results  Airway Mallampati: III  TM Distance: >3 FB Neck ROM: Full  Mouth opening: Limited Mouth Opening  Dental no notable dental hx. (+) Teeth Intact   Pulmonary neg pulmonary ROS, Current Smoker,    Pulmonary exam normal breath sounds clear to auscultation       Cardiovascular Exercise Tolerance: Good hypertension, Pt. on medications Normal cardiovascular exam+ dysrhythmias Atrial Fibrillation  Rhythm:Regular Rate:Normal     Neuro/Psych negative neurological ROS  negative psych ROS   GI/Hepatic negative GI ROS, Neg liver ROS,   Endo/Other  negative endocrine ROS  Renal/GU negative Renal ROS  negative genitourinary   Musculoskeletal negative musculoskeletal ROS (+)   Abdominal   Peds negative pediatric ROS (+)  Hematology negative hematology ROS (+)   Anesthesia Other Findings   Reproductive/Obstetrics negative OB ROS                           Anesthesia Physical Anesthesia Plan  ASA: II  Anesthesia Plan: General   Post-op Pain Management:    Induction: Intravenous  PONV Risk Score and Plan: 2 and Ondansetron, Treatment may vary due to age or medical condition and Dexamethasone  Airway Management Planned: Oral ETT and LMA  Additional Equipment:   Intra-op Plan:   Post-operative Plan: Extubation in OR  Informed Consent: I have reviewed the patients History and Physical, chart, labs and discussed the procedure including the risks, benefits and alternatives for the proposed anesthesia with the patient or authorized representative who has indicated his/her understanding and acceptance.       Plan Discussed with: CRNA, Anesthesiologist and Surgeon  Anesthesia Plan Comments: (  )         Anesthesia Quick Evaluation

## 2018-10-18 NOTE — Progress Notes (Signed)
Spoke with [name] [name], he is supposed to come in today at 2:00pm for labwork.

## 2018-10-18 NOTE — Telephone Encounter (Signed)
Oncology Nurse Navigator Documentation  Received call from [place], ENT Dr. Deeann Saint office, indicating Dt. Benjamine Mola will be conducting 4/1 bx as planned.  Drs. Granfortuna and Zhao updated.  Gayleen Orem, RN, BSN Head & Neck Oncology Nurse [place] at [place] [phone]

## 2018-10-18 NOTE — Telephone Encounter (Signed)
Great. Thanks. I believe he sees Dr Maylon Peppers on Friday - with a little luck, prelim Path will be back.

## 2018-10-18 NOTE — Progress Notes (Signed)
Patient in AFIB when EKG done with no chest pain, fluttering or shortness of breath.  Patient states previous AFIB 10 years ago.  Comparison EKG printed and presented to Dr. Sabra Heck for review.  Patient takes warfarin but stopped a few days ago for procedure.    Per Dr. Sabra Heck, okay to proceed with surgery as scheduled with this current EKG.  Waiting on results from PT (INR).

## 2018-10-19 ENCOUNTER — Ambulatory Visit (HOSPITAL_BASED_OUTPATIENT_CLINIC_OR_DEPARTMENT_OTHER)
Admission: RE | Admit: 2018-10-19 | Discharge: 2018-10-19 | Disposition: A | Discharge status: Home / Self Care | Payer: Medicaid Other | Attending: Otolaryngology | Admitting: Otolaryngology

## 2018-10-19 ENCOUNTER — Ambulatory Visit (HOSPITAL_BASED_OUTPATIENT_CLINIC_OR_DEPARTMENT_OTHER): Payer: Medicaid Other | Admitting: Certified Registered"

## 2018-10-19 ENCOUNTER — Telehealth: Payer: Self-pay | Admitting: *Deleted

## 2018-10-19 ENCOUNTER — Encounter (HOSPITAL_BASED_OUTPATIENT_CLINIC_OR_DEPARTMENT_OTHER)
Admission: RE | Disposition: A | Discharge status: Home / Self Care | Payer: Self-pay | Source: Home / Self Care | Attending: Otolaryngology

## 2018-10-19 ENCOUNTER — Encounter (HOSPITAL_BASED_OUTPATIENT_CLINIC_OR_DEPARTMENT_OTHER): Payer: Self-pay

## 2018-10-19 DIAGNOSIS — F17210 Nicotine dependence, cigarettes, uncomplicated: Secondary | ICD-10-CM | POA: Insufficient documentation

## 2018-10-19 DIAGNOSIS — Z86718 Personal history of other venous thrombosis and embolism: Secondary | ICD-10-CM | POA: Insufficient documentation

## 2018-10-19 DIAGNOSIS — I4891 Unspecified atrial fibrillation: Secondary | ICD-10-CM | POA: Insufficient documentation

## 2018-10-19 DIAGNOSIS — Z79899 Other long term (current) drug therapy: Secondary | ICD-10-CM | POA: Insufficient documentation

## 2018-10-19 DIAGNOSIS — I4510 Unspecified right bundle-branch block: Secondary | ICD-10-CM | POA: Diagnosis not present

## 2018-10-19 DIAGNOSIS — D490 Neoplasm of unspecified behavior of digestive system: Secondary | ICD-10-CM | POA: Diagnosis not present

## 2018-10-19 DIAGNOSIS — E785 Hyperlipidemia, unspecified: Secondary | ICD-10-CM | POA: Insufficient documentation

## 2018-10-19 DIAGNOSIS — Z7901 Long term (current) use of anticoagulants: Secondary | ICD-10-CM | POA: Insufficient documentation

## 2018-10-19 DIAGNOSIS — C099 Malignant neoplasm of tonsil, unspecified: Secondary | ICD-10-CM | POA: Insufficient documentation

## 2018-10-19 DIAGNOSIS — Z8679 Personal history of other diseases of the circulatory system: Secondary | ICD-10-CM | POA: Insufficient documentation

## 2018-10-19 DIAGNOSIS — D3705 Neoplasm of uncertain behavior of pharynx: Secondary | ICD-10-CM | POA: Diagnosis not present

## 2018-10-19 DIAGNOSIS — I10 Essential (primary) hypertension: Secondary | ICD-10-CM | POA: Insufficient documentation

## 2018-10-19 HISTORY — PX: TONSILLECTOMY: SHX5217

## 2018-10-19 HISTORY — PX: DIRECT LARYNGOSCOPY: SHX5326

## 2018-10-19 SURGERY — LARYNGOSCOPY, DIRECT
Anesthesia: General | Site: Throat | Laterality: Left

## 2018-10-19 SURGERY — LARYNGOSCOPY, DIRECT
Anesthesia: General | Laterality: Left

## 2018-10-19 MED ORDER — FENTANYL CITRATE (PF) 100 MCG/2ML IJ SOLN: 25.0000 ug | INTRAMUSCULAR | Status: DC | PRN

## 2018-10-19 MED ORDER — CEFAZOLIN SODIUM-DEXTROSE 1-4 GM/50ML-% IV SOLN
INTRAVENOUS | Status: DC | PRN
  Administered 2018-10-19: 2 g via INTRAVENOUS

## 2018-10-19 MED ORDER — ONDANSETRON HCL 4 MG/2ML IJ SOLN: 4.0000 mg | Freq: Once | INTRAMUSCULAR | Status: DC | PRN

## 2018-10-19 MED ORDER — GLYCOPYRROLATE PF 0.2 MG/ML IJ SOSY
PREFILLED_SYRINGE | INTRAMUSCULAR | Status: AC
  Filled 2018-10-19: qty 1

## 2018-10-19 MED ORDER — OXYCODONE-ACETAMINOPHEN 5-325 MG PO TABS: 1.0000 | ORAL_TABLET | ORAL | 0 refills | Status: DC | PRN

## 2018-10-19 MED ORDER — SCOPOLAMINE 1 MG/3DAYS TD PT72: 1.0000 | MEDICATED_PATCH | Freq: Once | TRANSDERMAL | Status: DC | PRN

## 2018-10-19 MED ORDER — EPINEPHRINE PF 1 MG/ML IJ SOLN
INTRAMUSCULAR | Status: AC
  Filled 2018-10-19: qty 1

## 2018-10-19 MED ORDER — PROPOFOL 10 MG/ML IV BOLUS
INTRAVENOUS | Status: AC
  Filled 2018-10-19: qty 40

## 2018-10-19 MED ORDER — DEXAMETHASONE SODIUM PHOSPHATE 4 MG/ML IJ SOLN
INTRAMUSCULAR | Status: DC | PRN
  Administered 2018-10-19: 10 mg via INTRAVENOUS

## 2018-10-19 MED ORDER — OXYCODONE HCL 5 MG PO TABS: 5.0000 mg | ORAL_TABLET | Freq: Once | ORAL | Status: DC | PRN

## 2018-10-19 MED ORDER — LACTATED RINGERS IV SOLN
INTRAVENOUS | Status: DC
  Administered 2018-10-19: 08:00:00 via INTRAVENOUS

## 2018-10-19 MED ORDER — MIDAZOLAM HCL 2 MG/2ML IJ SOLN
1.0000 mg | INTRAMUSCULAR | Status: DC | PRN
  Administered 2018-10-19: 1 mg via INTRAVENOUS

## 2018-10-19 MED ORDER — MEPERIDINE HCL 25 MG/ML IJ SOLN: 6.2500 mg | INTRAMUSCULAR | Status: DC | PRN

## 2018-10-19 MED ORDER — ROCURONIUM BROMIDE 100 MG/10ML IV SOLN
INTRAVENOUS | Status: DC | PRN
  Administered 2018-10-19: 30 mg via INTRAVENOUS

## 2018-10-19 MED ORDER — LIDOCAINE 2% (20 MG/ML) 5 ML SYRINGE
INTRAMUSCULAR | Status: AC
  Filled 2018-10-19: qty 5

## 2018-10-19 MED ORDER — GLYCOPYRROLATE 0.2 MG/ML IJ SOLN
INTRAMUSCULAR | Status: DC | PRN
  Administered 2018-10-19: .1 mg via INTRAVENOUS

## 2018-10-19 MED ORDER — LIDOCAINE HCL (CARDIAC) PF 100 MG/5ML IV SOSY
PREFILLED_SYRINGE | INTRAVENOUS | Status: DC | PRN
  Administered 2018-10-19: 100 mg via INTRATRACHEAL

## 2018-10-19 MED ORDER — SODIUM CHLORIDE 0.9 % IR SOLN
Status: DC | PRN
  Administered 2018-10-19: 1

## 2018-10-19 MED ORDER — ROCURONIUM BROMIDE 50 MG/5ML IV SOSY
PREFILLED_SYRINGE | INTRAVENOUS | Status: AC
  Filled 2018-10-19: qty 5

## 2018-10-19 MED ORDER — SUCCINYLCHOLINE CHLORIDE 20 MG/ML IJ SOLN
INTRAMUSCULAR | Status: DC | PRN
  Administered 2018-10-19: 200 mg via INTRAVENOUS

## 2018-10-19 MED ORDER — OXYCODONE HCL 5 MG/5ML PO SOLN: 5.0000 mg | Freq: Once | ORAL | Status: DC | PRN

## 2018-10-19 MED ORDER — FENTANYL CITRATE (PF) 100 MCG/2ML IJ SOLN
INTRAMUSCULAR | Status: AC
  Filled 2018-10-19: qty 2

## 2018-10-19 MED ORDER — OXYMETAZOLINE HCL 0.05 % NA SOLN
NASAL | Status: AC
  Filled 2018-10-19: qty 30

## 2018-10-19 MED ORDER — SUGAMMADEX SODIUM 500 MG/5ML IV SOLN
INTRAVENOUS | Status: DC | PRN
  Administered 2018-10-19: 500 mg via INTRAVENOUS

## 2018-10-19 MED ORDER — MIDAZOLAM HCL 2 MG/2ML IJ SOLN
INTRAMUSCULAR | Status: AC
  Filled 2018-10-19: qty 2

## 2018-10-19 MED ORDER — ACETAMINOPHEN 160 MG/5ML PO SOLN: 325.0000 mg | ORAL | Status: DC | PRN

## 2018-10-19 MED ORDER — PROPOFOL 10 MG/ML IV BOLUS
INTRAVENOUS | Status: DC | PRN
  Administered 2018-10-19: 50 mg via INTRAVENOUS
  Administered 2018-10-19: 150 mg via INTRAVENOUS

## 2018-10-19 MED ORDER — ONDANSETRON HCL 4 MG/2ML IJ SOLN
INTRAMUSCULAR | Status: DC | PRN
  Administered 2018-10-19: 4 mg via INTRAVENOUS

## 2018-10-19 MED ORDER — SUGAMMADEX SODIUM 200 MG/2ML IV SOLN
INTRAVENOUS | Status: AC
  Filled 2018-10-19: qty 2

## 2018-10-19 MED ORDER — ACETAMINOPHEN 325 MG PO TABS: 325.0000 mg | ORAL_TABLET | ORAL | Status: DC | PRN

## 2018-10-19 MED ORDER — FENTANYL CITRATE (PF) 100 MCG/2ML IJ SOLN
50.0000 ug | INTRAMUSCULAR | Status: DC | PRN
  Administered 2018-10-19 (×2): 50 ug via INTRAVENOUS

## 2018-10-19 SURGICAL SUPPLY — 46 items
ADAPTER TUBE FLEX ULTRASET (MISCELLANEOUS) IMPLANT
BNDG COHESIVE 2X5 TAN STRL LF (GAUZE/BANDAGES/DRESSINGS) IMPLANT
CANISTER SUCT 1200ML W/VALVE (MISCELLANEOUS) ×3 IMPLANT
CATH ROBINSON RED A/P 10FR (CATHETERS) IMPLANT
CATH ROBINSON RED A/P 14FR (CATHETERS) IMPLANT
COAGULATOR SUCT 6 FR SWTCH (ELECTROSURGICAL)
COAGULATOR SUCT SWTCH 10FR 6 (ELECTROSURGICAL) IMPLANT
CONT SPEC 4OZ CLIKSEAL STRL BL (MISCELLANEOUS) IMPLANT
COVER BACK TABLE REUSABLE LG (DRAPES) ×3 IMPLANT
COVER MAYO STAND REUSABLE (DRAPES) ×3 IMPLANT
COVER WAND RF STERILE (DRAPES) IMPLANT
ELECT REM PT RETURN 9FT ADLT (ELECTROSURGICAL)
ELECT REM PT RETURN 9FT PED (ELECTROSURGICAL)
ELECTRODE REM PT RETRN 9FT PED (ELECTROSURGICAL) IMPLANT
ELECTRODE REM PT RTRN 9FT ADLT (ELECTROSURGICAL) IMPLANT
GAUZE SPONGE 4X4 12PLY STRL LF (GAUZE/BANDAGES/DRESSINGS) ×6 IMPLANT
GLOVE BIO SURGEON STRL SZ7.5 (GLOVE) ×3 IMPLANT
GOWN STRL REUS W/ TWL LRG LVL3 (GOWN DISPOSABLE) ×2 IMPLANT
GOWN STRL REUS W/TWL LRG LVL3 (GOWN DISPOSABLE) ×6
GUARD TEETH (MISCELLANEOUS) IMPLANT
IV NS 500ML (IV SOLUTION) ×3
IV NS 500ML BAXH (IV SOLUTION) ×1 IMPLANT
MARKER SKIN DUAL TIP RULER LAB (MISCELLANEOUS) IMPLANT
NDL HYPO 18GX1.5 BLUNT FILL (NEEDLE) ×1 IMPLANT
NDL SPNL 22GX7 QUINCKE BK (NEEDLE) IMPLANT
NDL SPNL 25GX3.5 QUINCKE BL (NEEDLE) IMPLANT
NEEDLE HYPO 18GX1.5 BLUNT FILL (NEEDLE) ×3 IMPLANT
NEEDLE SPNL 22GX7 QUINCKE BK (NEEDLE) IMPLANT
NEEDLE SPNL 25GX3.5 QUINCKE BL (NEEDLE) IMPLANT
NS IRRIG 1000ML POUR BTL (IV SOLUTION) ×3 IMPLANT
PACK BASIN DAY SURGERY FS (CUSTOM PROCEDURE TRAY) ×3 IMPLANT
PATTIES SURGICAL .5 X3 (DISPOSABLE) ×3 IMPLANT
SHEET MEDIUM DRAPE 40X70 STRL (DRAPES) ×3 IMPLANT
SLEEVE SCD COMPRESS KNEE MED (MISCELLANEOUS) IMPLANT
SOLUTION BUTLER CLEAR DIP (MISCELLANEOUS) ×3 IMPLANT
SPONGE TONSIL TAPE 1.25 RFD (DISPOSABLE) IMPLANT
SURGILUBE 2OZ TUBE FLIPTOP (MISCELLANEOUS) IMPLANT
SYR BULB 3OZ (MISCELLANEOUS) IMPLANT
SYR CONTROL 10ML LL (SYRINGE) IMPLANT
SYR TB 1ML LL NO SAFETY (SYRINGE) IMPLANT
TOWEL GREEN STERILE FF (TOWEL DISPOSABLE) ×3 IMPLANT
TUBE CONNECTING 20'X1/4 (TUBING) ×1
TUBE CONNECTING 20X1/4 (TUBING) ×2 IMPLANT
TUBE SALEM SUMP 12R W/ARV (TUBING) IMPLANT
TUBE SALEM SUMP 16 FR W/ARV (TUBING) IMPLANT
WAND COBLATOR 70 EVAC XTRA (SURGICAL WAND) IMPLANT

## 2018-10-19 NOTE — Transfer of Care (Signed)
Immediate Anesthesia Transfer of Care Note  Patient: [name] [name]  Procedure(s) Performed: DIRECT LARYNGOSCOPY (Left Throat) BIOPSY OF LEFT TONSIL (Left Throat)  Patient Location: PACU  Anesthesia Type:General  Level of Consciousness: awake, alert  and oriented  Airway & Oxygen Therapy: Patient Spontanous Breathing and Patient connected to face mask oxygen  Post-op Assessment: Report given to RN and Post -op Vital signs reviewed and stable  Post vital signs: Reviewed and stable  Last Vitals:  Vitals Value Taken Time  BP    Temp    Pulse    Resp 17 10/19/2018  9:06 AM  SpO2    Vitals shown include unvalidated device data.  Last Pain:  Vitals:   10/19/18 0724  TempSrc: Oral  PainSc: 0-No pain      Patients Stated Pain Goal: 5 (82/80/03 4917)  Complications: No apparent anesthesia complications

## 2018-10-19 NOTE — H&P (Signed)
Cc: Left tonsillar mass  HPI:  [name] [name] is an 58 y.o. male who presents today for biopsy of his left tonsillar mass. According to the patient, he has been experiencing persistent sore throat for the past 2 months. He was recently seen by Dr. Beryle Beams, who noted left neck lymphadenopathy. 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 The patient has a 30+ year history of tobacco use. He has no previous history of ENT surgery. The patient stopped the use of Coumadin 3 days ago.  Past Medical History:  Diagnosis Date  . 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 Benign essential HTN 01/04/2012  . Chronic anticoagulation 01/02/2013  . Dermatofibroma of forearm 01/02/2013   Left side  . Hyperlipidemia, mixed 01/04/2012  . 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 Primary hypercoagulable state ([place]) 10/01/2014  . Sinus bradycardia, chronic 01/04/2012  . Superficial thrombosis of lower extremity 05/02/2012    History reviewed. No pertinent surgical history.  History reviewed. No pertinent family history.  Social History:  reports that he has been smoking cigarettes. He has a 15.00 pack-year smoking history. He has never used smokeless tobacco. He reports current alcohol use. He reports that he does not use drugs.  Allergies: No Known Allergies  Prior to Admission medications   Medication Sig Start Date End Date Taking? Authorizing Provider  amLODipine (NORVASC) 10 MG tablet TAKE 1 TABLET BY MOUTH EVERY DAY 04/08/18  Yes Annia Belt, MD  Coenzyme Q10-Fish Oil-Vit E (CO-Q 10 OMEGA-3 FISH OIL) CAPS Take 1 capsule by mouth 2 (two) times daily. 10/01/14  Yes Annia Belt, MD  warfarin (COUMADIN) 10 MG tablet TAKE 1 TABLET BY MOUTH EVERY DAY 06/30/18  Yes Annia Belt, MD    Results for orders placed or performed during the hospital encounter of 10/19/18 (from the past 48 hour(s))  PT- INR at PAT visit (Pre-admission Testing)     Status: Abnormal   Collection Time: 10/18/18  3:30 PM  Result Value Ref Range   Prothrombin Time 16.6 (H) 11.4 - 15.2 seconds   INR 1.4 (H) 0.8 - 1.2    Comment: (NOTE) INR goal varies based on device and disease states. Performed at [facility], [address], [place], [place] [address]     Nm Pet Image Initial (pi) Skull Base To Thigh  Result Date: 10/18/2018 CLINICAL DATA:  Subsequent treatment strategy for LEFT neck lymphadenopathy EXAM: NUCLEAR MEDICINE PET SKULL BASE TO THIGH TECHNIQUE: 13.94 mCi F-18 FDG was injected intravenously. Full-ring PET imaging was performed from the skull base to thigh after the radiotracer. CT data was obtained and used for attenuation correction and anatomic localization. Fasting blood glucose: 118 mg/dl COMPARISON:  Neck CT 10/10/2018 FINDINGS: Mediastinal blood pool activity: SUV max 2.5 NECK: Elongated hypermetabolic thickening of the LEFT lingual tonsil with SUV max equal 14.3. Two adjacent hypermetabolic LEFT level II lymph nodes beneath the sternocleidomastoid muscle. The larger anterior node measures 18 mm with SUV max equal 10.6. Smaller posterior node measures 11 mm with SUV max equal 9.3. No contralateral hypermetabolic cervical lymph nodes. Incidental CT findings: none CHEST: No hypermetabolic mediastinal or hilar nodes. No suspicious pulmonary nodules on the CT scan. Incidental CT findings: 6 mm nodule in the LEFT lower lobe (image 46/8).  Small adjacent LEFT lung base nodule measuring 5 mm (image 43/8). No associated metabolic activity the small lymph nodes pulmonary nodules. ABDOMEN/PELVIS: No no upper abdominal adenopathy. Normal liver pancreas spleen and kidneys. Small LEFT distal common iliac lymph node measuring 9 mm (image 190/4) has mild metabolic activity (SUV max equal  2.5) which is equal to background adjacent vascular activity (SUV max equal 2.5). Similar small lymph node in the RIGHT common iliac station (image 186/4. Prostate normal Incidental CT findings: none SKELETON: No focal hypermetabolic activity to suggest skeletal metastasis. Incidental CT findings: none IMPRESSION: 1. 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 2. Local hypermetabolic nodal metastasis to the LEFT level II nodal station. 3. No contralateral nodal metastasis. 4. No evidence distant metastatic disease. 5. Two small pulmonary nodules in the LEFT lower lobe are favored benign. Recommend attention on follow-up. 6. Several small common iliac lymph nodes with low metabolic activity. Favor reactive adenopathy. Electronically Signed   By: Suzy Bouchard M.D.   On: 10/18/2018 15:48    Blood pressure (!) 139/91, pulse 91, temperature 97.6 F (36.4 C), temperature source Oral, resp. rate 16, height 6\' 3"  (1.905 m), weight 119.4 kg, SpO2 97 %. Physical Exam: Gen.: Alert and oriented 3, no acute distress. Eyes: His pupils are equal, round, reactive to light. Extraocular motion is intact.  Ears: Examination of the ears shows normal auricles and external auditory canals bilaterally. Both tympanic membranes are intact.  Nose: Nasal examination shows normal mucosa, septum, turbinates.  Face: Facial examination shows no asymmetry. Palpation of the face elicit no significant tenderness.  Mouth: Oral cavity examination shows no mucosal lacerations. No significant trismus is noted. Left tonsillar mass noted. Neck: Multiple left neck lymphadenopathy. The trachea is midline. The thyroid is not significantly enlarged.  Neuro: Cranial nerves 2-12 are all grossly in tact.  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 The patient also has multiple left neck lymphadenopathy, likely representing regional metastasis. - The physical exam findings  are reviewed with the patient. - We will proceed with direct laryngoscopy and biopsy of the left tonsillar mass. The risks, benefits, and details of the procedure are reviewed with the patient. Questions are invited and answered. Informed consent is obtained.  Azariah Latendresse W Antonio Creswell 10/19/2018, 8:17 AM

## 2018-10-19 NOTE — Telephone Encounter (Signed)
Oncology Nurse Navigator Documentation  In follow-up to this morning's ENT Conference recommendation, called [facility] Dept of Otolaryngology and placed referral for TORS consultation and pre-radiotherapy dental evaluation with Dr. Desmond Dike.   Provided information to Katharine Look who informed Dentistry is not currently seeing new pts.   Also spoke with Albertina Senegal, Scheduling Coordinator for Otolaryngology residents; she indicated she will call pt to schedule appt.  Drs. Erasmo Downer and [facility] informed.  Gayleen Orem, RN, BSN Head & Neck Oncology Nurse [place] at [place] [phone]

## 2018-10-19 NOTE — Progress Notes (Signed)
Head and Neck Cancer Location of Tumor / Histology:  Tissue sample collected 10/19/18- Dr. Benjamine Mola  Patient presented with symptoms of: He began experiencing a sore throat about 2 months ago. He was noted to have a left neck mass when he saw Dr. Beryle Beams on 10/03/18 and a CT neck was ordered to be done.   Biopsy of left tonsil revealed:  Nutrition Status Yes No Comments  Weight changes? []  []    Swallowing concerns? []  []    PEG? []  []     Referrals Yes No Comments  Social Work? []  []    Dentistry? []  []    Swallowing therapy? []  []    Nutrition? []  []    Med/Onc? [x]  []  Dr. Maylon Peppers 10/21/18   Safety Issues Yes No Comments  Prior radiation? []  [x]    Pacemaker/ICD? []  [x]    Possible current pregnancy? []  [x]    Is the patient on methotrexate? []  [x]     Tobacco/Marijuana/Snuff/ETOH use: He is smoking about a 1/2 pack daily and is drinking alcohol.  Past/Anticipated interventions by otolaryngology, if any:  Dr. Benjamine Mola 10/19/18 PROCEDURE PERFORMED:  Direct laryngoscopy and biopsy of the left tonsillar mass.  Past/Anticipated interventions by medical oncology, if any:  Dr. Maylon Peppers 10/21/18  Suspected left tonsillar malignancy  I reviewed the patient's records in detail, including IM and ENT clinic notes, lab studies, and imaging results -I also independently reviewed the radiologic images of recent CT neck/chest and PET, and agree with the findings as documented -In summary, patient presented to Dr. Beryle Beams for the management of his chronic recurrent DVTs, and reported a new onset left neck lump.  CT neck showed prominence at the left tonsillar area, concerning for primary tonsillar malignancy, as well as at least two left Level II and one Level IV LN's, suspicious for malignant adenopathy.  There was no contralateral LN disease.  CT chest showed questionable right hilar fullness, but otherwise did not show any definitive evidence of metastasis. PET showed FDG-avid left tonsil w/ left Level II nodal  involvement but no evidence of metastatic disease. Patient underwent DL and left tonsillar biopsy on 10/19/2018 by Dr. Melene Plan, the results of which are currently pending.  -I reviewed the imaging and pathology results in detail with the patient -I also reviewed NCCN guidelines at length with the patient -We discussed in the multidisciplinary approach for head and neck cancers, including ENT, radiation oncology, and medical oncology.  We discussed that the treatment approach is individualized based on the specific case, and may involve various combinations of surgery, chemotherapy, and radiation. -I also discussed with the patient some of the benefits and potential toxicities of chemotherapy -Given the relatively low volume disease, upfront surgical resection, followed by adjuvant radiation with or without chemotherapy, is a very reasonable approach -Furthermore, in light of patient's multiple comorbidities, including ongoing tobacco/EtOH abuse, paroxysmal A. fib, peripheral vascular disease, and history of noncompliance, he would likely tolerate systemic chemotherapy poorly and is at high risk for adverse outcome -Patient expressed understanding and was leaning toward upfront surgical resection is he is a candidate     Current Complaints / other details:

## 2018-10-19 NOTE — Anesthesia Procedure Notes (Signed)
Procedure Name: Intubation Performed by: Verita Lamb, CRNA Pre-anesthesia Checklist: Patient identified, Emergency Drugs available, Suction available, Patient being monitored and Timeout performed Patient Re-evaluated:Patient Re-evaluated prior to induction Oxygen Delivery Method: Circle system utilized Preoxygenation: Pre-oxygenation with 100% oxygen Induction Type: IV induction Laryngoscope Size: Mac, 4 and Glidescope Grade View: Grade I Tube type: Oral Tube size: 7.0 mm Number of attempts: 1 Airway Equipment and Method: Stylet and Rigid stylet Placement Confirmation: ETT inserted through vocal cords under direct vision,  positive ETCO2,  CO2 detector and breath sounds checked- equal and bilateral Secured at: 26 cm Tube secured with: Tape Dental Injury: Teeth and Oropharynx as per pre-operative assessment  Difficulty Due To: Difficulty was anticipated Future Recommendations: Recommend- induction with short-acting agent, and alternative techniques readily available

## 2018-10-19 NOTE — Op Note (Signed)
DATE OF PROCEDURE:  10/19/2018                              OPERATIVE REPORT  SURGEON:  Leta Baptist, MD  PREOPERATIVE DIAGNOSES: 1. Left tonsillar mass  POSTOPERATIVE DIAGNOSES: 1. Left tonsillar mass  PROCEDURE PERFORMED:  Direct laryngoscopy and biopsy of the left tonsillar mass.  ANESTHESIA:  General endotracheal tube anesthesia.  COMPLICATIONS:  None.  ESTIMATED BLOOD LOSS:  42ml  INDICATION FOR PROCEDURE:  [name] [name] is a 58 y.o. male who was recently noted to have a left tonsillar mass and left neck lymphadenopathy. 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 The patient is a long-term smoker. Based on the above findings, the decision was made for the patient to undergo the above-stated procedure.  The risks, benefits, alternatives, and details of the procedure were discussed with the patient.  Questions were invited and answered.  Informed consent was obtained.  DESCRIPTION:  The patient was taken to the operating room and placed supine on the operating table.  General endotracheal tube anesthesia was administered by the anesthesiologist.  The patient was positioned and prepped and draped in a standard fashion for direct laryngoscopy.  A Dedo laryngoscope was inserted via the oral cavity into the pharynx. A firm 3+ tonsillar mass was noted on the left side. 1+ tonsil was noted on the right. The vallecula, epiglottis, aryepiglottic folds, piriform sinuses, and the vocal cords were normal. A Crowe-Davis mouth gag was then inserted into the oral cavity for exposure. Multiple biopsy specimens were obtained from the left tonsillar mass. Hemostasis was achieved with a suction electrocautery device. The surgical site was copiously irrigated.  The care of the patient was turned over to the anesthesiologist.  The patient was awakened from anesthesia without difficulty.  The patient was extubated and transferred to the recovery room in good condition.  OPERATIVE  FINDINGS:  Left tonsillar mass, concerning for stem cell carcinoma.  SPECIMEN:  Left tonsillar mass biopsy.  FOLLOWUP CARE:  The patient will be discharged home once awake and alert.    Dalores Weger W Saramarie Stinger 10/19/2018 9:00 AM

## 2018-10-19 NOTE — Discharge Instructions (Addendum)
The patient may resume all his previous activities. He may advance his diet as tolerated. The patient will follow-up with his oncologist later this week.  He may resume his fish oil and Coumadin tomorrow if no significant bleeding is noted. Otherwise, he should withhold the medications until his bleeding resolves.  Call your surgeon if you experience:   1.  Fever over 101.0. 2.  Inability to urinate. 3.  Nausea and/or vomiting. 4.  Extreme swelling or bruising at the surgical site. 5.  Continued bleeding from the incision. 6.  Increased pain, redness or drainage from the incision. 7.  Problems related to your pain medication. 8.  Any problems and/or concerns.   Post Anesthesia Home Care Instructions  Activity: Get plenty of rest for the remainder of the day. A responsible individual must stay with you for 24 hours following the procedure.  For the next 24 hours, DO NOT: -Drive a car -Paediatric nurse -Drink alcoholic beverages -Take any medication unless instructed by your physician -Make any legal decisions or sign important papers.  Meals: Start with liquid foods such as gelatin or soup. Progress to regular foods as tolerated. Avoid greasy, spicy, heavy foods. If nausea and/or vomiting occur, drink only clear liquids until the nausea and/or vomiting subsides. Call your physician if vomiting continues.  Special Instructions/Symptoms: Your throat may feel dry or sore from the anesthesia or the breathing tube placed in your throat during surgery. If this causes discomfort, gargle with warm salt water. The discomfort should disappear within 24 hours.

## 2018-10-19 NOTE — Anesthesia Postprocedure Evaluation (Signed)
Anesthesia Post Note  Patient: [name] [name]  Procedure(s) Performed: DIRECT LARYNGOSCOPY (Left Throat) BIOPSY OF LEFT TONSIL (Left Throat)     Patient location during evaluation: PACU Anesthesia Type: General Level of consciousness: awake and alert Pain management: pain level controlled Vital Signs Assessment: post-procedure vital signs reviewed and stable Respiratory status: spontaneous breathing, nonlabored ventilation, respiratory function stable and patient connected to nasal cannula oxygen Cardiovascular status: blood pressure returned to baseline and stable Postop Assessment: no apparent nausea or vomiting Anesthetic complications: no    Last Vitals:  Vitals:   10/19/18 0930 10/19/18 0952  BP: 113/86 (!) 120/95  Pulse: 85 82  Resp: 17 18  Temp:  37.2 C  SpO2: 97% 95%    Last Pain:  Vitals:   10/19/18 0945  TempSrc:   PainSc: 0-No pain                 Giavonni Cizek

## 2018-10-20 ENCOUNTER — Encounter (HOSPITAL_BASED_OUTPATIENT_CLINIC_OR_DEPARTMENT_OTHER): Payer: Self-pay | Admitting: Otolaryngology

## 2018-10-21 ENCOUNTER — Encounter: Payer: Self-pay | Admitting: Hematology

## 2018-10-21 ENCOUNTER — Telehealth: Payer: Self-pay | Admitting: Hematology

## 2018-10-21 ENCOUNTER — Other Ambulatory Visit: Payer: Self-pay

## 2018-10-21 ENCOUNTER — Ambulatory Visit: Payer: Self-pay

## 2018-10-21 ENCOUNTER — Inpatient Hospital Stay: Payer: Medicaid Other | Attending: Hematology | Admitting: Hematology

## 2018-10-21 ENCOUNTER — Ambulatory Visit
Admission: RE | Admit: 2018-10-21 | Discharge: 2018-10-21 | Disposition: A | Discharge status: Home / Self Care | Payer: Medicaid Other | Source: Ambulatory Visit | Attending: Radiation Oncology | Admitting: Radiation Oncology

## 2018-10-21 ENCOUNTER — Encounter: Payer: Self-pay | Admitting: Radiation Oncology

## 2018-10-21 ENCOUNTER — Telehealth: Payer: Self-pay | Admitting: *Deleted

## 2018-10-21 ENCOUNTER — Ambulatory Visit
Admission: RE | Admit: 2018-10-21 | Discharge: 2018-10-21 | Disposition: A | Discharge status: Home / Self Care | Payer: Self-pay | Source: Ambulatory Visit | Attending: Radiation Oncology | Admitting: Radiation Oncology

## 2018-10-21 ENCOUNTER — Inpatient Hospital Stay: Payer: Medicaid Other

## 2018-10-21 VITALS — BP 133/88 | HR 92 | Temp 98.0°F | Resp 20

## 2018-10-21 DIAGNOSIS — Z86718 Personal history of other venous thrombosis and embolism: Secondary | ICD-10-CM | POA: Insufficient documentation

## 2018-10-21 DIAGNOSIS — C099 Malignant neoplasm of tonsil, unspecified: Secondary | ICD-10-CM

## 2018-10-21 DIAGNOSIS — R221 Localized swelling, mass and lump, neck: Secondary | ICD-10-CM | POA: Insufficient documentation

## 2018-10-21 DIAGNOSIS — D751 Secondary polycythemia: Secondary | ICD-10-CM | POA: Insufficient documentation

## 2018-10-21 DIAGNOSIS — C090 Malignant neoplasm of tonsillar fossa: Secondary | ICD-10-CM | POA: Insufficient documentation

## 2018-10-21 DIAGNOSIS — I825Y1 Chronic embolism and thrombosis of unspecified deep veins of right proximal lower extremity: Secondary | ICD-10-CM

## 2018-10-21 DIAGNOSIS — Z86711 Personal history of pulmonary embolism: Secondary | ICD-10-CM

## 2018-10-21 DIAGNOSIS — Z7901 Long term (current) use of anticoagulants: Secondary | ICD-10-CM | POA: Diagnosis not present

## 2018-10-21 DIAGNOSIS — I480 Paroxysmal atrial fibrillation: Secondary | ICD-10-CM

## 2018-10-21 DIAGNOSIS — I82401 Acute embolism and thrombosis of unspecified deep veins of right lower extremity: Secondary | ICD-10-CM | POA: Diagnosis not present

## 2018-10-21 DIAGNOSIS — Z79899 Other long term (current) drug therapy: Secondary | ICD-10-CM | POA: Insufficient documentation

## 2018-10-21 DIAGNOSIS — D6859 Other primary thrombophilia: Secondary | ICD-10-CM | POA: Insufficient documentation

## 2018-10-21 DIAGNOSIS — Z510 Encounter for antineoplastic radiation therapy: Secondary | ICD-10-CM | POA: Insufficient documentation

## 2018-10-21 DIAGNOSIS — I10 Essential (primary) hypertension: Secondary | ICD-10-CM | POA: Insufficient documentation

## 2018-10-21 DIAGNOSIS — Z87891 Personal history of nicotine dependence: Secondary | ICD-10-CM | POA: Insufficient documentation

## 2018-10-21 DIAGNOSIS — C770 Secondary and unspecified malignant neoplasm of lymph nodes of head, face and neck: Secondary | ICD-10-CM | POA: Insufficient documentation

## 2018-10-21 DIAGNOSIS — Z5111 Encounter for antineoplastic chemotherapy: Secondary | ICD-10-CM | POA: Diagnosis present

## 2018-10-21 DIAGNOSIS — Z720 Tobacco use: Secondary | ICD-10-CM | POA: Insufficient documentation

## 2018-10-21 DIAGNOSIS — Z9119 Patient's noncompliance with other medical treatment and regimen: Secondary | ICD-10-CM | POA: Diagnosis not present

## 2018-10-21 DIAGNOSIS — I739 Peripheral vascular disease, unspecified: Secondary | ICD-10-CM | POA: Diagnosis not present

## 2018-10-21 DIAGNOSIS — F1010 Alcohol abuse, uncomplicated: Secondary | ICD-10-CM | POA: Diagnosis not present

## 2018-10-21 LAB — CBC WITH DIFFERENTIAL (CANCER CENTER ONLY)
Abs Immature Granulocytes: 0.01 10*3/uL (ref 0.00–0.07)
Basophils Absolute: 0 10*3/uL (ref 0.0–0.1)
Basophils Relative: 0 %
Eosinophils Absolute: 0.1 10*3/uL (ref 0.0–0.5)
Eosinophils Relative: 1 %
HCT: 51.6 % (ref 39.0–52.0)
Hemoglobin: 18.1 g/dL — ABNORMAL HIGH (ref 13.0–17.0)
Immature Granulocytes: 0 %
Lymphocytes Relative: 25 %
Lymphs Abs: 1.8 10*3/uL (ref 0.7–4.0)
MCH: 32.6 pg (ref 26.0–34.0)
MCHC: 35.1 g/dL (ref 30.0–36.0)
MCV: 93 fL (ref 80.0–100.0)
Monocytes Absolute: 1 10*3/uL (ref 0.1–1.0)
Monocytes Relative: 13 %
Neutro Abs: 4.3 10*3/uL (ref 1.7–7.7)
Neutrophils Relative %: 61 %
Platelet Count: 199 10*3/uL (ref 150–400)
RBC: 5.55 MIL/uL (ref 4.22–5.81)
RDW: 13.3 % (ref 11.5–15.5)
WBC Count: 7.2 10*3/uL (ref 4.0–10.5)
nRBC: 0 % (ref 0.0–0.2)

## 2018-10-21 LAB — CMP (CANCER CENTER ONLY)
ALT: 19 U/L (ref 0–44)
AST: 20 U/L (ref 15–41)
Albumin: 4.4 g/dL (ref 3.5–5.0)
Alkaline Phosphatase: 73 U/L (ref 38–126)
Anion gap: 10 (ref 5–15)
BUN: 18 mg/dL (ref 6–20)
CO2: 28 mmol/L (ref 22–32)
Calcium: 9.1 mg/dL (ref 8.9–10.3)
Chloride: 100 mmol/L (ref 98–111)
Creatinine: 0.76 mg/dL (ref 0.61–1.24)
GFR, Est AFR Am: >60 mL/min (ref >60)
GFR, Estimated: >60 mL/min (ref >60)
Glucose, Bld: 98 mg/dL (ref 70–99)
Potassium: 3.9 mmol/L (ref 3.5–5.1)
Sodium: 138 mmol/L (ref 135–145)
Total Bilirubin: 0.6 mg/dL (ref 0.3–1.2)
Total Protein: 7.4 g/dL (ref 6.5–8.1)

## 2018-10-21 LAB — PROTIME-INR
INR: 2 — ABNORMAL HIGH (ref 0.8–1.2)
Prothrombin Time: 22.4 seconds — ABNORMAL HIGH (ref 11.4–15.2)

## 2018-10-21 LAB — T4, FREE: Free T4: 1.19 ng/dL (ref 0.82–1.77)

## 2018-10-21 NOTE — Telephone Encounter (Signed)
Oncology Nurse Navigator Documentation  LVMM for pt and SO asking for call-back re upcoming appts.  Gayleen Orem, RN, BSN Head & Neck Oncology Nurse [place] at [place] [phone]

## 2018-10-21 NOTE — Telephone Encounter (Signed)
Return to be determined

## 2018-10-21 NOTE — Progress Notes (Signed)
Radiation Oncology         [phone] ________________________________  Initial WebEx Consultation  Name: [name] [name] MRN: [id number]  Date: 10/21/2018  DOB: 1961/01/07  CN:[id number], No Pcp Per  Annia Belt, MD   REFERRING PHYSICIAN: Annia Belt, MD  DIAGNOSIS:    ICD-10-CM   1. 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 male who presented with a hard lump in his upper left neck. He has been followed by Dr. 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   Neck and chest CTs were ordered and performed on 10/10/2018. 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 Chest CT revealed: mild right hilar adenopathy, equivocal for metastasis, no additional potential evidence of metastatic disease in the chest; ascending thoracic aortic 4.7 cm aneurysm.  Biopsy of tonsillar mass on 10/19/2018 results squamous cell carcinoma, p16+  Pertinent imaging thus far includes PET scan performed on 10/18/2018 revealing: hypermetabolic left lingual tonsil fullness consistent with primary carcinoma; local hypermetabolic nodal metastasis to the left level 2 nodal station; no contralateral nodal metastasis; no evidence of distant metastatic disease; two small pulmonary nodules in the left lower lobe are favored benign; several small common iliac lymph nodes with low metabolic activity, favor reactive adenopathy. I've looked at his images.  He met with Dr. Maylon Peppers earlier today, 10/21/2018. He is  not felt to be a good candidate for chemotherapy given his co-morbidities.  He notes sore throat, neck mass.    Tobacco history, if any: current every day smoker, half pack a day for the last 30 years.  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    PAST SURGICAL HISTORY: Past Surgical History:  Procedure Laterality Date   DIRECT LARYNGOSCOPY Left 10/19/2018   Procedure: DIRECT LARYNGOSCOPY;  Surgeon: Leta Baptist, MD;  Location: [place];  Service: ENT;  Laterality: Left;   TONSILLECTOMY Left 10/19/2018   Procedure: BIOPSY OF LEFT TONSIL;  Surgeon: Leta Baptist, MD;  Location: [place];  Service: ENT;  Laterality: Left;    FAMILY HISTORY: family history is not on file.  SOCIAL HISTORY:  reports that he quit smoking 2 days ago. His smoking use included cigarettes. He has a 15.00 pack-year smoking history. He has never used smokeless tobacco. He reports current alcohol use of about 13.0 - 14.0 standard drinks of alcohol per week. He reports that he does not use drugs.  ALLERGIES: Patient has no known allergies.  MEDICATIONS:  Current Outpatient Medications  Medication Sig Dispense Refill   amLODipine (NORVASC) 10 MG tablet TAKE 1 TABLET BY MOUTH EVERY DAY 90 tablet 2  oxyCODONE-acetaminophen (PERCOCET) 5-325 MG tablet Take 1 tablet by mouth every 4 (four) hours as needed for severe pain. 25 tablet 0   warfarin (COUMADIN) 10  MG tablet Take 10 mg by mouth daily at 6 PM.     No current facility-administered medications for this encounter.     REVIEW OF SYSTEMS:  Notable for that above.   PHYSICAL EXAM:  vitals were not taken for this visit.   General: Alert and oriented, in no acute distress     LABORATORY DATA:  Lab Results  Component Value Date   WBC 7.2 10/21/2018   HGB 18.1 (H) 10/21/2018   HCT 51.6 10/21/2018   MCV 93.0 10/21/2018   PLT 199 10/21/2018   CMP     Component Value Date/Time   NA 138 10/21/2018 1148   NA 142 10/03/2018 1450   NA 141 05/21/2014 1226   K 3.9 10/21/2018 1148   K 4.3 05/21/2014 1226   CL 100 10/21/2018 1148   CL 108 (H) 12/28/2012 1405   CO2 28 10/21/2018 1148   CO2 28 05/21/2014 1226   GLUCOSE 98 10/21/2018 1148   GLUCOSE 101 05/21/2014 1226   GLUCOSE 95 12/28/2012 1405   BUN 18 10/21/2018 1148   BUN 15 10/03/2018 1450   BUN 13.2 05/21/2014 1226   CREATININE 0.76 10/21/2018 1148   CREATININE 1.00 09/24/2014 1413   CREATININE 0.9 05/21/2014 1226   CALCIUM 9.1 10/21/2018 1148   CALCIUM 9.3 05/21/2014 1226   PROT 7.4 10/21/2018 1148   PROT 7.2 10/03/2018 1450   PROT 7.1 01/08/2014 1203   ALBUMIN 4.4 10/21/2018 1148   ALBUMIN 4.3 10/03/2018 1450   ALBUMIN 3.7 01/08/2014 1203   AST 20 10/21/2018 1148   AST 50 (H) 01/08/2014 1203   ALT 19 10/21/2018 1148   ALT 83 (H) 01/08/2014 1203   ALKPHOS 73 10/21/2018 1148   ALKPHOS 87 01/08/2014 1203   BILITOT 0.6 10/21/2018 1148   BILITOT 0.67 01/08/2014 1203   GFRNONAA >60 10/21/2018 1148   GFRNONAA >60 10/15/2011 0224   GFRAA >60 10/21/2018 1148   GFRAA >60 10/15/2011 0224      Lab Results  Component Value Date   TSH 3.35 10/15/2011     RADIOGRAPHY: Ct Soft Tissue Neck W Contrast  Result Date: 10/10/2018 CLINICAL DATA:  Localized swelling of the neck. EXAM: CT NECK WITH CONTRAST TECHNIQUE: Multidetector CT imaging of the neck was performed using the standard protocol following the bolus administration  of intravenous contrast. CONTRAST:  139m OMNIPAQUE IOHEXOL 300 MG/ML  SOLN COMPARISON:  None. FINDINGS: Pharynx and larynx: There is asymmetric prominence of the left tonsillar region. No other mucosal or submucosal lesion is seen. Salivary glands: Parotid and submandibular glands are normal. Thyroid: Normal Lymph nodes: Definite adenopathy on the left. 2 level nodes which shows some internal necrosis. The larger measures 2.2 cm in diameter and the smaller measures 1.4 cm in diameter. Suspicious level 4 node image 83 just anterior to the jugular vein measuring 9 mm short axis diameter. No worrisome nodes on the right. Vascular: No abnormal vascular finding. Limited intracranial: Normal Visualized orbits: Normal Mastoids and visualized paranasal sinuses: Clear except for an insignificant retention cyst in the inferior left maxillary sinus. Skeleton: Normal Upper chest: Early emphysema.  See results of chest CT. Other: None IMPRESSION: Probable primary mass in the left tonsillar region. Lymphadenopathy on the left with some internal necrosis. 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 Electronically Signed  By: Nelson Chimes M.D.   On: 10/10/2018 20:31   Ct Chest W Contrast  Result Date: 10/11/2018 CLINICAL DATA:  Head neck cancer. Left neck adenopathy. Current smoker. EXAM: CT CHEST WITH CONTRAST TECHNIQUE: Multidetector CT imaging of the chest was performed during intravenous contrast administration. CONTRAST:  138m OMNIPAQUE IOHEXOL 300 MG/ML  SOLN COMPARISON:  10/15/2011 chest CT angiogram. FINDINGS: Cardiovascular: Normal heart size. No significant pericardial effusion/thickening. 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 Top-normal caliber main pulmonary artery (3.4 cm diameter). No central pulmonary emboli. Mediastinum/Nodes: No discrete thyroid nodules. Unremarkable esophagus. No pathologically enlarged axillary, mediastinal or hilar lymph nodes. Lungs/Pleura: No pneumothorax.  No pleural effusion. Mild centrilobular and paraseptal emphysema with mild diffuse bronchial wall thickening. A few scattered solid pulmonary nodules in both lungs, largest 6 mm in the left lower lobe (series 4/image 104), all stable since 10/15/2011 chest CT, considered benign. No acute consolidative airspace disease, lung masses or new significant pulmonary nodules. Parenchymal banding at lung bases bilaterally, compatible with postinfectious/postinflammatory scarring. Upper abdomen: No acute abnormality. Musculoskeletal: No aggressive appearing focal osseous lesions. Mild thoracic spondylosis. IMPRESSION: 1. Mild right hilar adenopathy, equivocal for metastasis. No additional potential evidence of metastatic disease in the chest. PET-CT could be considered for further staging evaluation, as clinically warranted. 2. 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 Ascending thoracic aortic aneurysm. Recommend semi-annual imaging followup by CTA or MRA and referral to cardiothoracic surgery if not already obtained. This recommendation follows 2010 ACCF/AHA/AATS/ACR/ASA/SCA/SCAI/SIR/STS/SVM Guidelines for the Diagnosis and Management of Patients With Thoracic Aortic Disease. Circulation. 2010; 121:: Q229-N989 Aortic aneurysm NOS (ICD10-I71.9). Emphysema (ICD10-J43.9). Electronically Signed   By: JIlona SorrelM.D.   On: 10/11/2018 08:15   Nm Pet Image Initial (pi) Skull Base To Thigh  Result Date: 10/18/2018 CLINICAL DATA:  Subsequent treatment strategy for LEFT neck lymphadenopathy EXAM: NUCLEAR MEDICINE PET SKULL BASE TO THIGH TECHNIQUE: 13.94 mCi F-18 FDG was injected intravenously. Full-ring PET imaging was performed from the skull base to thigh after the radiotracer. CT data was obtained and used for attenuation correction and anatomic localization. Fasting blood glucose: 118 mg/dl COMPARISON:  Neck CT 10/10/2018 FINDINGS: Mediastinal blood pool activity: SUV max 2.5 NECK: Elongated hypermetabolic thickening of  the LEFT lingual tonsil with SUV max equal 14.3. Two adjacent hypermetabolic LEFT level II lymph nodes beneath the sternocleidomastoid muscle. The larger anterior node measures 18 mm with SUV max equal 10.6. Smaller posterior node measures 11 mm with SUV max equal 9.3. No contralateral hypermetabolic cervical lymph nodes. Incidental CT findings: none CHEST: No hypermetabolic mediastinal or hilar nodes. No suspicious pulmonary nodules on the CT scan. Incidental CT findings: 6 mm nodule in the LEFT lower lobe (image 46/8). Small adjacent LEFT lung base nodule measuring 5 mm (image 43/8). No associated metabolic activity the small lymph nodes pulmonary nodules. ABDOMEN/PELVIS: No no upper abdominal adenopathy. Normal liver pancreas spleen and kidneys. Small LEFT distal common iliac lymph node measuring 9 mm (image 190/4) has mild metabolic activity (SUV max equal 2.5) which is equal to background adjacent vascular activity (SUV max equal 2.5). Similar small lymph node in the RIGHT common iliac station (image 186/4. Prostate normal Incidental CT findings: none SKELETON: No focal hypermetabolic activity to suggest skeletal metastasis. Incidental CT findings: none IMPRESSION: 1. 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 2. Local hypermetabolic nodal metastasis to the LEFT level II nodal station. 3. No contralateral nodal metastasis. 4. No evidence distant metastatic disease. 5. Two small pulmonary nodules in the LEFT  lower lobe are favored benign. Recommend attention on follow-up. 6. Several small common iliac lymph nodes with low metabolic activity. Favor reactive adenopathy. Electronically Signed   By: Suzy Bouchard M.D.   On: 10/18/2018 15:48      IMPRESSION/PLAN: Tonsillar Cancer  This is a delightful patient with head and neck cancer. I anticipate I will recommend radiotherapy for this patient. This will be definitive vs adjuvant if he opts for TORS.  Appt with ENT at [facility]  is pending 11/02/18.  We discussed the potential risks, benefits, and side effects of radiotherapy. We talked in detail about acute and late effects. We discussed that some of the most bothersome acute effects may be mucositis, dysgeusia, salivary changes, skin irritation,  dehydration, weight loss and fatigue.  No guarantees of treatment were given.  The patient is enthusiastic about proceeding with treatment. I look forward to participating in the patient's care as needed.    Simulation (treatment planning) will take place upon clearance of his disposition by [facility] ENT --- may pursue TORS  - TBD.  Gayleen Orem, RN, our Head and Neck Oncology Navigator will navigate him.  We discussed measures to reduce the risk of infection during the COVID-19 pandemic.    This encounter was provided by telemedicine platform Webex to lessen risk of viral contraction during the pandemic.  The patient has given verbal consent for this type of encounter and has been advised to only accept a meeting of this type in a secure network environment. The time spent during this encounter was over 20 minutes. The attendants for this meeting include Eppie Gibson  and [name].  During the encounter, Eppie Gibson was located at [facility] Radiation Oncology Department.  ZYLER HYSON was located at home.  __________________________________________   Eppie Gibson, MD   This document serves as a record of services personally performed by Eppie Gibson, MD. It was created on her behalf by Wilburn Mylar, a trained medical scribe. The creation of this record is based on the scribe's personal observations and the provider's statements to them. This document has been checked and approved by the attending provider.

## 2018-10-21 NOTE — Progress Notes (Signed)
Dental Form with Estimates of Radiation Dose     Red line depicts 50 Gy on LEFT.  Anticipate I can mold dose from tooth roots if needed.  Diagnosis:    ICD-10-CM   1. Malignant neoplasm of tonsillar fossa ([facility]) C09.0     Prognosis: good  Anticipated # of fractions: 30-35    Daily?: yes  # of weeks of radiotherapy: 6-7  Chemotherapy?: unlikely  Anticipated xerostomia:  Mild permanent    Pre-simulation needs:  Scatter protection   Simulation: TORS first? TBD.    Other Notes:   Please contact Eppie Gibson, MD, with patient's disposition after evaluation and/or dental treatment.

## 2018-10-24 ENCOUNTER — Encounter (HOSPITAL_COMMUNITY): Payer: Self-pay | Admitting: Dentistry

## 2018-10-24 ENCOUNTER — Telehealth: Payer: Self-pay | Admitting: *Deleted

## 2018-10-24 ENCOUNTER — Ambulatory Visit (HOSPITAL_COMMUNITY): Payer: Self-pay | Admitting: Dentistry

## 2018-10-24 ENCOUNTER — Other Ambulatory Visit: Payer: Self-pay

## 2018-10-24 VITALS — BP 131/88 | HR 80 | Temp 98.2°F

## 2018-10-24 DIAGNOSIS — Z01818 Encounter for other preprocedural examination: Secondary | ICD-10-CM

## 2018-10-24 DIAGNOSIS — K0889 Other specified disorders of teeth and supporting structures: Secondary | ICD-10-CM

## 2018-10-24 DIAGNOSIS — M264 Malocclusion, unspecified: Secondary | ICD-10-CM

## 2018-10-24 DIAGNOSIS — K036 Deposits [accretions] on teeth: Secondary | ICD-10-CM

## 2018-10-24 DIAGNOSIS — K045 Chronic apical periodontitis: Secondary | ICD-10-CM

## 2018-10-24 DIAGNOSIS — K08409 Partial loss of teeth, unspecified cause, unspecified class: Secondary | ICD-10-CM

## 2018-10-24 DIAGNOSIS — K0530 Chronic periodontitis, unspecified: Secondary | ICD-10-CM

## 2018-10-24 DIAGNOSIS — M270 Developmental disorders of jaws: Secondary | ICD-10-CM

## 2018-10-24 DIAGNOSIS — K029 Dental caries, unspecified: Secondary | ICD-10-CM

## 2018-10-24 DIAGNOSIS — C090 Malignant neoplasm of tonsillar fossa: Secondary | ICD-10-CM

## 2018-10-24 DIAGNOSIS — Z9189 Other specified personal risk factors, not elsewhere classified: Secondary | ICD-10-CM

## 2018-10-24 DIAGNOSIS — M2632 Excessive spacing of fully erupted teeth: Secondary | ICD-10-CM

## 2018-10-24 DIAGNOSIS — K06010 Localized gingival recession, unspecified: Secondary | ICD-10-CM

## 2018-10-24 DIAGNOSIS — K0850 Unsatisfactory restoration of tooth, unspecified: Secondary | ICD-10-CM

## 2018-10-24 LAB — TSH: TSH: 1.963 u[IU]/mL (ref 0.320–4.118)

## 2018-10-24 MED ORDER — SODIUM FLUORIDE 1.1 % DT CREA: TOPICAL_CREAM | DENTAL | 99 refills | Status: DC

## 2018-10-24 NOTE — Telephone Encounter (Signed)
Medical records faxed to [facility] Otolaryngology; [place] [phone]

## 2018-10-24 NOTE — Progress Notes (Signed)
DENTAL CONSULTATION  Date of Consultation:  10/24/2018 Patient Name:   [name] [name] Date of Birth:   04-Jan-1961 Medical Record Number: [id number]  VITALS: BP 131/88 (BP Location: Right Arm)   Pulse 80   Temp 98.2 F (36.8 C)   CHIEF COMPLAINT: Patient was referred by Dr. Isidore Moos for a dental consultation.  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 Patient with possible TORS procedure followed by postoperative radiation therapy and possible chemotherapy. Patient may also choose to proceed with chemoradiation therapy as his primary treatment modality. Patient is now seen as part of a medically necessary pre-chemoradiation therapy dental protocol examination.  The patient currently denies acute toothaches, swellings, or abscesses. Patient was last seen by a dentist in May of 2013 for a dental extraction. There were no complications. This was with Dr. Moss Mc. Prior to that, the patient had seen Dr. Tommi Emery in December 2010 for an exam and cleaning. Dr. Owens Shark has since retired leaving the practice to Dr. Moss Mc. Patient denies having any partial dentures. Patient denies having dental phobia.  PROBLEM LIST: Patient Active Problem List   Diagnosis Date Noted  . Malignant neoplasm of tonsillar fossa ([place]) 10/17/2018    Priority: High  . ETOH abuse 10/21/2018  . Tobacco abuse 10/21/2018  . 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 Primary hypercoagulable state ([place]) 10/01/2014  . DVT, lower extremity, proximal ([place]) 05/24/2013  . Chronic anticoagulation 01/02/2013  . Dermatofibroma of forearm 01/02/2013  . Superficial thrombosis of lower extremity 05/02/2012  . Benign essential HTN 01/04/2012  . Hyperlipidemia, mixed 01/04/2012  . 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 Sinus bradycardia, chronic 01/04/2012  .  Pulmonary embolism ([place]) 05/26/2011    PMH: Past Medical History:  Diagnosis Date  . 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 Benign essential HTN 01/04/2012  . Chronic anticoagulation 01/02/2013  . Dermatofibroma of forearm 01/02/2013   Left side  . Hyperlipidemia, mixed 01/04/2012  . 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 Primary hypercoagulable state ([place]) 10/01/2014  . Sinus bradycardia, chronic 01/04/2012  . Superficial thrombosis of lower extremity 05/02/2012    PSH: Past Surgical History:  Procedure Laterality Date  . DIRECT LARYNGOSCOPY Left 10/19/2018   Procedure: DIRECT LARYNGOSCOPY;  Surgeon: Leta Baptist, MD;  Location: [facility];  Service: ENT;  Laterality: Left;  . TONSILLECTOMY Left 10/19/2018   Procedure: BIOPSY OF LEFT TONSIL;  Surgeon: Leta Baptist, MD;  Location: [facility];  Service: ENT;  Laterality: Left;    ALLERGIES: No Known Allergies  MEDICATIONS: Current Outpatient Medications  Medication Sig Dispense Refill  . amLODipine (NORVASC) 10 MG tablet TAKE 1 TABLET BY MOUTH EVERY DAY 90 tablet 2  . oxyCODONE-acetaminophen (PERCOCET) 5-325 MG tablet Take 1 tablet by mouth every 4 (four) hours as needed for severe pain. 25 tablet 0  . warfarin (COUMADIN) 10 MG tablet Take 10 mg by mouth daily at 6 PM.     No current facility-administered medications for this visit.     LABS: Lab Results  Component Value Date   WBC 7.2 10/21/2018   HGB 18.1 (H) 10/21/2018   HCT 51.6 10/21/2018   MCV 93.0 10/21/2018  PLT 199 10/21/2018      Component Value Date/Time   NA 138 10/21/2018 1148   NA 142 10/03/2018 1450   NA 141 05/21/2014 1226   K 3.9 10/21/2018 1148   K 4.3 05/21/2014 1226   CL 100 10/21/2018 1148   CL 108 (H) 12/28/2012 1405   CO2 28 10/21/2018 1148   CO2 28  05/21/2014 1226   GLUCOSE 98 10/21/2018 1148   GLUCOSE 101 05/21/2014 1226   GLUCOSE 95 12/28/2012 1405   BUN 18 10/21/2018 1148   BUN 15 10/03/2018 1450   BUN 13.2 05/21/2014 1226   CREATININE 0.76 10/21/2018 1148   CREATININE 1.00 09/24/2014 1413   CREATININE 0.9 05/21/2014 1226   CALCIUM 9.1 10/21/2018 1148   CALCIUM 9.3 05/21/2014 1226   GFRNONAA >60 10/21/2018 1148   GFRNONAA >60 10/15/2011 0224   GFRAA >60 10/21/2018 1148   GFRAA >60 10/15/2011 0224   Lab Results  Component Value Date   INR 2.0 (H) 10/21/2018   INR 1.4 (H) 10/18/2018   INR 2.3 (H) 10/03/2018   PROTIME 28.8 (H) 02/04/2015   PROTIME 28.8 (H) 12/10/2014   PROTIME 36.0 (H) 08/24/2014   No results found for: PTT  SOCIAL HISTORY: Social History   Socioeconomic History  . Marital status: Divorced    Spouse name: Not on file  . Number of children: 2  . Years of education: Not on file  . Highest education level: Not on file  Occupational History  . Not on file  Social Needs  . Financial resource strain: Not on file  . Food insecurity:    Worry: Not on file    Inability: Not on file  . Transportation needs:    Medical: No    Non-medical: No  Tobacco Use  . Smoking status: Former Smoker    Packs/day: 0.50    Years: 30.00    Pack years: 15.00    Types: Cigarettes    Last attempt to quit: 10/19/2018    Years since quitting: 0.0  . Smokeless tobacco: Never Used  Substance and Sexual Activity  . Alcohol use: Yes    Alcohol/week: 13.0 - 14.0 standard drinks    Types: 12 Cans of beer, 1 - 2 Shots of liquor per week    Comment: 1-2 times per week.  . Drug use: No  . Sexual activity: Not on file  Lifestyle  . Physical activity:    Days per week: Not on file    Minutes per session: Not on file  . Stress: Not on file  Relationships  . Social connections:    Talks on phone: Not on file    Gets together: Not on file    Attends religious service: Not on file    Active member of club or  organization: Not on file    Attends meetings of clubs or organizations: Not on file    Relationship status: Not on file  . Intimate partner violence:    Fear of current or ex partner: No    Emotionally abused: No    Physically abused: No    Forced sexual activity: No  Other Topics Concern  . Not on file  Social History Narrative  . Not on file     FAMILY HISTORY: History reviewed. No pertinent family history.  REVIEW OF SYSTEMS: Reviewed with the patient as per History of present illness. Psych: Patient denies having dental phobia.  DENTAL HISTORY: CHIEF COMPLAINT: Patient was referred by Dr. Isidore Moos for  a dental consultation.  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 Patient with possible TORS procedure followed by postoperative radiation therapy and possible chemotherapy. Patient may also choose to proceed with chemoradiation therapy as his primary treatment modality. Patient is now seen as part of a medically necessary pre-chemoradiation therapy dental protocol examination.  The patient currently denies acute toothaches, swellings, or abscesses. Patient was last seen by a dentist in May of 2013 for a dental extraction. There were no complications. This was with Dr. Moss Mc. Prior to that, the patient had seen Dr. Tommi Emery in December 2010 for an exam and cleaning. Dr. Owens Shark has since retired leaving the practice to Dr. Moss Mc. Patient denies having any partial dentures. Patient denies having dental phobia.  DENTAL EXAMINATION: GENERAL:  The patient is a tall, well-developed, well-nourished male in no acute distress. HEAD AND NECK:  The patient has left neck lymphadenopathy. There is no right neck lymphadenopathy. The patient denies acute TMJ symptoms. Maximum Interincisal opening is measured at 45 mm. INTRAORAL EXAM: The patient has normal saliva. The patient has bilateral mandibular  lingual tori. There is no evidence of oral abscess formation. DENTITION: The patient is missing tooth numbers 1, 3, 13, 16, 17, 20, and 32. PERIODONTAL:  The patient has chronic periodontitis with plaque and calculus accumulations, gingival recession, and incipient to moderate bone loss. There is tooth mobility as per dental charting form. DENTAL CARIES/SUBOPTIMAL RESTORATIONS:  Patient has multiple dental caries and suboptimal dental restorations. ENDODONTIC:  The patient currently denies acute pulpitis symptoms.The patient does have a root canal therapy associated with tooth #31. There is persistent periapical radiolucency at the apex of the distal root. This tooth is not sensitive to percussion or palpation at this time. There is tooth mobility associated with tooth #31 measured at class I+. CROWN AND BRIDGE:  The patient has multiple crown and bridge restorations. There are recurrent caries on the facial aspect of tooth #19. PROSTHODONTIC:  There are no partial dentures. OCCLUSION: The patient has a poor occlusal scheme secondary to multiple missing teeth, supra-eruption and drifting of the unopposed teeth into the edentulous areas, and lack replacement of all missing teeth with dental prostheses.  RADIOGRAPHIC INTERPRETATION: Orthopantogram was taken today and supplemented with a full series of dental radiographs. There are multiple missing teeth. There is incipient to moderate bone loss noted. Dental caries and suboptimal dental restorations are noted.  There is a previous root canal therapy associated with tooth #31 with persistent periapical radiolucency involving the distal root. Multiple resin, amalgam, and crown restorations are noted.multiple restorations are suboptimal with presence of overhangs and recurrent caries.   ASSESSMENTS: 1. Squamous cell carcinoma of the left tonsillar fossa. 2. Pre-chemoradiation therapy dental protocol examination 3. Dental caries 4. Suboptimal dental  restorations 5. Chronic periodontitis with bone loss 6. Gingival recession 7. Tooth mobility 8. Accretions 9. Multiple missing teeth 10. Multiple diastemas 11. Poor occlusal scheme and malocclusion 12. Root canal therapy of tooth #31 with persistent periapical radiolucency 13. Bilateral, small mandibular lingual tori 14. Chronic anticoagulation with risk for bleeding with invasive dental procedures   PLAN/RECOMMENDATIONS: 1. I discussed the risks, benefits, and complications of various treatment options with the patient in relationship to his medical and dental conditions, possible TORS procedure, radiation therapy, and chemotherapy. We discussed risk for side effects to include xerostomia, radiation caries, trismus, mucositis, taste changes, gum and jawbone changes, and risk for infection,  bleeding, and osteoradionecrosis. We discussed various treatment options to include no treatment, extraction of tooth numbers 18, 19, and 31 with alveoloplasty, pre-prosthetic surgery as indicated, periodontal therapy, dental restorations, root canal therapy, crown and bridge therapy, implant therapy, and replacement of missing teeth as indicated. We also discussed impressions today for future fabrication of fluoride trays and scatter protection devices.  We also discussed referral to an oral surgeon, endodontist, and his primary dentist as indicated.  The patient currently is adamant about NOT wanting dental extractions at this time.  According to the anticipated port drawing by Dr. Isidore Moos, no teeth will be in the primary field of radiation therapy. However, due to the suboptimal restorations on tooth numbers 18 and 19 and the tooth mobility of #31 with the persistent periapical radiolucency, I recommended extraction of these teeth. The patient again however was adamant about not doing so.  The patient did agree to proceed with impressions today for future fabrication of fluoride trays and scatter protection  devices. Patient also agreed to be referred back to his primary dentist for evaluation for restorations on tooth number 18 and possibly 19. The patient will proceed with evaluation for TORS procedure at [facility] on Wednesday, 11/02/18.  Patient will then be scheduled for insertion of fluoride trays and scatter protection devices as indicated. A prescription for PreviDent 5000 fluoride toothpaste has been sent to his pharmacy with refills for one year with normal directions for brushing on his teeth at bedtime.   2. Discussion of findings with medical team and coordination of future medical and dental care as needed.  I spent in excess of  120 minutes during the conduct of this consultation and >50% of this time involved direct face-to-face encounter for counseling and/or coordination of the patient's care.    Lenn Cal, DDS

## 2018-10-24 NOTE — Patient Instructions (Signed)

## 2018-10-24 NOTE — Telephone Encounter (Signed)
Call from [facility], [place] w/ request for records on pt be sent to Albertina Senegal at [facility] fax@ [phone]

## 2018-10-24 NOTE — Addendum Note (Signed)
Addended by: Lenn Cal on: 10/24/2018 10:47 AM   Modules accepted: Orders

## 2018-10-25 ENCOUNTER — Telehealth: Payer: Self-pay | Admitting: *Deleted

## 2018-10-25 DIAGNOSIS — Z9089 Acquired absence of other organs: Secondary | ICD-10-CM | POA: Diagnosis not present

## 2018-10-25 DIAGNOSIS — Z86711 Personal history of pulmonary embolism: Secondary | ICD-10-CM | POA: Diagnosis not present

## 2018-10-25 DIAGNOSIS — Z86718 Personal history of other venous thrombosis and embolism: Secondary | ICD-10-CM | POA: Diagnosis not present

## 2018-10-25 DIAGNOSIS — C024 Malignant neoplasm of lingual tonsil: Secondary | ICD-10-CM | POA: Insufficient documentation

## 2018-10-25 DIAGNOSIS — Z7901 Long term (current) use of anticoagulants: Secondary | ICD-10-CM | POA: Diagnosis not present

## 2018-10-25 DIAGNOSIS — Z87891 Personal history of nicotine dependence: Secondary | ICD-10-CM | POA: Diagnosis not present

## 2018-10-25 NOTE — Telephone Encounter (Signed)
Oncology Nurse Navigator Documentation  In follow-up to 1452 VMM left yesterday for patient and SO indicating 4/15 [facility] surgical consult has been rescheduled for 4/7 1:30, called SO who confirmed message receipt, acknowledged will arrive.  Gayleen Orem, RN, BSN Head & Neck Oncology Nurse [place] at [place] [phone]

## 2018-10-26 ENCOUNTER — Telehealth: Payer: Self-pay | Admitting: *Deleted

## 2018-10-26 NOTE — Telephone Encounter (Addendum)
Oncology Nurse Navigator Documentation  LVMM and sent e-mail to pt, LVMM for SO, informing of 8:00 appt tomorrow morning Dental Medicine followed by 9:45 IV Start / 10:30 CT SIM.  Requested e-mail reply/call-back to confirm message receipt.  Note:  No confirmation as of 1605.  Addendum:  Mr. [name] [name] returned call, 1616, confirmed understanding of tomorrow's appts.  Drs. Isidore Moos and [employer] notified.     Gayleen Orem, RN, BSN Head & Neck Oncology Nurse [place] at [place] [phone]

## 2018-10-26 NOTE — Progress Notes (Signed)
Has armband been applied?  Yes  Does patient have an allergy to IV contrast dye?: No   Has patient ever received premedication for IV contrast dye?: N/A  Does patient take metformin?: No  If patient does take metformin when was the last dose: N/A  Date of lab work: 10/21/18 BUN: 18 CR: 0.76 EGFR: >60  IV site: Right Hand  Has IV site been added to flowsheet?  Yes

## 2018-10-27 ENCOUNTER — Ambulatory Visit
Admission: RE | Admit: 2018-10-27 | Discharge: 2018-10-27 | Disposition: A | Discharge status: Home / Self Care | Payer: Medicaid Other | Source: Ambulatory Visit | Attending: Radiation Oncology | Admitting: Radiation Oncology

## 2018-10-27 ENCOUNTER — Encounter (HOSPITAL_COMMUNITY): Payer: Self-pay | Admitting: Dentistry

## 2018-10-27 ENCOUNTER — Ambulatory Visit (HOSPITAL_COMMUNITY): Payer: Self-pay | Admitting: Dentistry

## 2018-10-27 ENCOUNTER — Other Ambulatory Visit: Payer: Self-pay

## 2018-10-27 VITALS — BP 119/90 | HR 65 | Temp 97.9°F

## 2018-10-27 VITALS — BP 122/84 | HR 63 | Temp 98.8°F | Resp 18 | Ht 75.0 in | Wt 261.1 lb

## 2018-10-27 DIAGNOSIS — C090 Malignant neoplasm of tonsillar fossa: Secondary | ICD-10-CM

## 2018-10-27 DIAGNOSIS — C770 Secondary and unspecified malignant neoplasm of lymph nodes of head, face and neck: Secondary | ICD-10-CM | POA: Diagnosis not present

## 2018-10-27 DIAGNOSIS — Z7901 Long term (current) use of anticoagulants: Secondary | ICD-10-CM | POA: Diagnosis not present

## 2018-10-27 DIAGNOSIS — Z86718 Personal history of other venous thrombosis and embolism: Secondary | ICD-10-CM | POA: Diagnosis not present

## 2018-10-27 DIAGNOSIS — Z510 Encounter for antineoplastic radiation therapy: Secondary | ICD-10-CM | POA: Diagnosis not present

## 2018-10-27 DIAGNOSIS — Z463 Encounter for fitting and adjustment of dental prosthetic device: Secondary | ICD-10-CM

## 2018-10-27 DIAGNOSIS — Z01818 Encounter for other preprocedural examination: Secondary | ICD-10-CM

## 2018-10-27 DIAGNOSIS — Z86711 Personal history of pulmonary embolism: Secondary | ICD-10-CM | POA: Diagnosis not present

## 2018-10-27 DIAGNOSIS — Z87891 Personal history of nicotine dependence: Secondary | ICD-10-CM | POA: Diagnosis not present

## 2018-10-27 DIAGNOSIS — Z79899 Other long term (current) drug therapy: Secondary | ICD-10-CM | POA: Diagnosis not present

## 2018-10-27 DIAGNOSIS — I10 Essential (primary) hypertension: Secondary | ICD-10-CM | POA: Diagnosis not present

## 2018-10-27 DIAGNOSIS — D6859 Other primary thrombophilia: Secondary | ICD-10-CM | POA: Diagnosis not present

## 2018-10-27 MED ORDER — SODIUM CHLORIDE 0.9% FLUSH
10.0000 mL | Freq: Once | INTRAVENOUS | Status: AC
  Administered 2018-10-27: 10 mL via INTRAVENOUS

## 2018-10-27 NOTE — Progress Notes (Signed)
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 Patient had consultation with Dr. Nicolette Bang for a TORS procedure.  It was determined, that the patient would not be a good surgical candidate due to the potential for surgical defect. Patient recommended to proceed with chemoradiation therapy at this time.  I then discussed the appointment with his primary dentist to discuss restoration of tooth numbers 18 and 19 with consideration for extraction of tooth numbers 18 and 19 and 31. Patient did not contact Dr. Nicki Reaper to arrange for this appointment. Patient again is adamant about not wanting extractions at this time. I then discussed proceeding with insertion of upper and lower fluoride trays and scatter protection devices. Patient agreed to this procedure today. The patient was instructed to contact Dr. Nicki Reaper to schedule an appointment for early next week on Tuesday or Wednesday to arrange for evaluation for restoration of tooth numbers 18 and 19. Patient expressed understanding.   PROCEDURE: Appliances were tried in and adjusted as needed. [place] Trismus device was fabricated at 45 mm using 24 sticks. Postop instructions were provided and a written and verbal format concerning the use and care of appliances. A prescription for the PreviDent 5000 fluoride therapy has already been called into his pharmacy with refills for one year. All questions were answered.   Plan: 1. Brush teeth after meals and at bedtime. 2. Use fluoride in the fluoride trays at bedtime as instructed. Alternatively, the patient may use the PreviDent 5000 on the toothbrush and a normal brushing method. 3. Patient is to perform trismus exercises daily once the radiation therapy starts. 4.  Patient is to follow-up with simulation appointment this morning at 9:45 AM. 5. Patient is to contact his primary dentist, Dr. Nicki Reaper, for appointment for evaluation for restoration of tooth numbers 18 and 19. 6. Patient to contact Dental Medicine if acute problems arise during radiation therapy, otherwise patient will be seen approximately one month after the chemoradiation therapy has been provided.  Patient to call if questions or problems arise before then.  Lenn Cal, DDS

## 2018-10-27 NOTE — Patient Instructions (Addendum)
Plan: 1. Brush teeth after meals and at bedtime. 2. Use fluoride in the fluoride trays at bedtime as instructed. Alternatively, the patient may use the PreviDent 5000 on the toothbrush and a normal brushing method. 3. Patient is to perform trismus exercises daily once the radiation therapy starts. 4. Patient is to follow-up with simulation appointment this morning at 9:45 AM. 5. Patient is to contact his primary dentist, Dr. Nicki Reaper, for appointment for evaluation for restoration of tooth numbers 18 and 19. 6. Patient to contact Dental Medicine if acute problems arise during radiation therapy, otherwise patient will be seen approximately one month after the chemoradiation therapy has been provided.  Patient to call if questions or problems arise before then.  Lenn Cal, DDS   FLUORIDE TRAYS PATIENT INSTRUCTIONS    Obtain Prevident 5000 prescription from the pharmacy.  Don't be surprised if it needs to be ordered.  Be sure to let the pharmacy know when you are close to needing a new refill for them to have it ready for you without interruption of Fluoride use.  The best time to use your Fluoride is before bedtime.  You must brush your teeth very well and floss before using the Fluoride in order to get the best use out of the Fluoride treatments.  Place Fluoride gel in the tray and spread gel around in the tray with your finger or cotton tip applicator.  Place the tray on your lower teeth and your upper teeth.  Make sure the trays are seated all the way.  Remember, they only fit one way on your teeth.  Insert for 5 full minutes.  At the end of the 5 minutes, take the trays out.  SPIT OUT excess.   Do NOT rinse your mouth!  Do NOT eat or drink after treatments for at least 30 minutes.  This is why the best time for your treatments is before bedtime.  Clean the inside of your Fluoride trays using COLD WATER and a toothbrush.  In order to keep your Trays from discoloring and  free from odors, soak them overnight in denture cleaners such as Efferdent.  Do not use bleach or non denture products.  Store the trays in a safe dry place AWAY from any heat until your next treatment.  If anything happens to your Fluoride trays, or they don't fit as well after any dental work, please let us know as soon as possible.

## 2018-10-28 ENCOUNTER — Other Ambulatory Visit: Payer: Self-pay | Admitting: Hematology

## 2018-10-28 ENCOUNTER — Telehealth: Payer: Self-pay | Admitting: *Deleted

## 2018-10-28 ENCOUNTER — Inpatient Hospital Stay (HOSPITAL_BASED_OUTPATIENT_CLINIC_OR_DEPARTMENT_OTHER): Payer: Medicaid Other | Admitting: Hematology

## 2018-10-28 ENCOUNTER — Encounter: Payer: Self-pay | Admitting: Hematology

## 2018-10-28 DIAGNOSIS — C090 Malignant neoplasm of tonsillar fossa: Secondary | ICD-10-CM | POA: Diagnosis not present

## 2018-10-28 DIAGNOSIS — D6859 Other primary thrombophilia: Secondary | ICD-10-CM | POA: Diagnosis not present

## 2018-10-28 DIAGNOSIS — Z87891 Personal history of nicotine dependence: Secondary | ICD-10-CM | POA: Diagnosis not present

## 2018-10-28 DIAGNOSIS — C099 Malignant neoplasm of tonsil, unspecified: Secondary | ICD-10-CM | POA: Diagnosis not present

## 2018-10-28 DIAGNOSIS — I825Y1 Chronic embolism and thrombosis of unspecified deep veins of right proximal lower extremity: Secondary | ICD-10-CM

## 2018-10-28 DIAGNOSIS — Z7901 Long term (current) use of anticoagulants: Secondary | ICD-10-CM | POA: Diagnosis not present

## 2018-10-28 DIAGNOSIS — Z86718 Personal history of other venous thrombosis and embolism: Secondary | ICD-10-CM | POA: Diagnosis not present

## 2018-10-28 DIAGNOSIS — Z86711 Personal history of pulmonary embolism: Secondary | ICD-10-CM | POA: Diagnosis not present

## 2018-10-28 DIAGNOSIS — Z510 Encounter for antineoplastic radiation therapy: Secondary | ICD-10-CM | POA: Diagnosis not present

## 2018-10-28 DIAGNOSIS — C770 Secondary and unspecified malignant neoplasm of lymph nodes of head, face and neck: Secondary | ICD-10-CM | POA: Diagnosis not present

## 2018-10-28 DIAGNOSIS — Z720 Tobacco use: Secondary | ICD-10-CM

## 2018-10-28 DIAGNOSIS — Z79899 Other long term (current) drug therapy: Secondary | ICD-10-CM | POA: Diagnosis not present

## 2018-10-28 DIAGNOSIS — I10 Essential (primary) hypertension: Secondary | ICD-10-CM | POA: Diagnosis not present

## 2018-10-28 DIAGNOSIS — F1010 Alcohol abuse, uncomplicated: Secondary | ICD-10-CM

## 2018-10-28 MED ORDER — ONDANSETRON HCL 8 MG PO TABS: 8.0000 mg | ORAL_TABLET | Freq: Two times a day (BID) | ORAL | 1 refills | Status: DC | PRN

## 2018-10-28 MED ORDER — DEXAMETHASONE 4 MG PO TABS: ORAL_TABLET | ORAL | 1 refills | Status: DC

## 2018-10-28 MED ORDER — LORAZEPAM 0.5 MG PO TABS: 0.5000 mg | ORAL_TABLET | Freq: Four times a day (QID) | ORAL | 0 refills | Status: DC | PRN

## 2018-10-28 MED ORDER — LIDOCAINE-PRILOCAINE 2.5-2.5 % EX CREA: TOPICAL_CREAM | CUTANEOUS | 3 refills | Status: DC

## 2018-10-28 MED ORDER — PROCHLORPERAZINE MALEATE 10 MG PO TABS: 10.0000 mg | ORAL_TABLET | Freq: Four times a day (QID) | ORAL | 1 refills | Status: DC | PRN

## 2018-10-28 NOTE — Telephone Encounter (Signed)
Oncology Nurse Navigator Documentation  Coordinated 4/16 port placement with Lanny Hurst, [facility] IR.   Informed pt of 12:30 arrival for 2:00 procedure, NPO status 6 hrs prior except for small sips of water with medications. He voiced understanding.  Gayleen Orem, RN, BSN Head & Neck Oncology Nurse [place] at [place] [phone]

## 2018-10-28 NOTE — Progress Notes (Signed)
[place]  HEMATOLOGY-ONCOLOGY TeleHEALTH VISIT PROGRESS NOTE   I connected with [name] [name] on 10/28/18 at  1:30 PM EDT by video enabled telemedicine visit and verified that I am speaking with the correct person using two identifiers.   I discussed the limitations, risks, security and privacy concerns of performing an evaluation and management service by telemedicine and the availability of in-person appointments. I also discussed with the patient that there may be a patient responsible charge related to this service. The patient expressed understanding and agreed to proceed.   Other persons participating in the visit and their role in the encounter: NA    Patient's location: home  Provider's location: clinic   Patient Care Team: Patient, No Pcp Per as PCP - General (General Practice) Annia Belt, MD as Consulting Physician (Oncology)  HEME/ONC OVERVIEW: 1. 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 Melene Plan, invasive SCCa, p16+; not a candidate for TORS per ENT   2. 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 Ongoing tobacco and EtOH abuse 2. Paroxysmal A-fib 3. 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 Nat Christen of ENT, who recommended against TORS due to significant morbidities from the surgery, including defect in the oropharynx  -In the absence of definitive upfront surgery, the standard-of-care approach is definitive chemoradiation -We discussed some of the risks, benefits, side-effects of cisplatin. The intent is for cure. -The plan is for cisplatin 40mg /m2 weekly x 7 weeks. -Some of the short term side-effects included, though not limited to, including weight loss, life threatening infections, risk of allergic reactions, need for transfusions of blood products, nausea, vomiting, change in bowel habits, loss of hair, admission to hospital for various reasons, and risks of death.  -Long term side-effects are also discussed including risks of infertility, permanent damage to nerve function, hearing loss, chronic fatigue, kidney damage with possibility needing hemodialysis, and rare secondary malignancy including bone marrow disorders. -The patient is aware that the response rates discussed earlier is not guaranteed.  After a long discussion, patient made an informed decision to proceed with the prescribed plan.  -In anticipation of chemoradiation, I have ordered PEG and port  -I have also sent PRN anti-emetics to the patient's pharmacy, including Zofran, Compazine, Ativan and dexamethasone -In light of the patient's uninsured status, I have asked the financial counselor to reach out to the patient regarding any financial assistance he may qualify for  Recurrent DVT's of the RLE and remote hx of PTE in 1990's -Patient has been on  warfarin since 2000 -We discussed different options of anticoagulation, including DOAC's, but the patient preferred to continue warfarin -He is followed by Coumadin clinic, and encourage patient to continue close follow-up with them for adjustment of warfarin dose as needed  EtOH abuse -Patient reports that he has made significant progress in reducing his EtOH use, and has only had 1 drink over the past week -I counseled the patient on the importance of EtOH abstinence, as EtOH abuse significantly increases risk of adverse outcomes from chemoradiation -Furthermore, anticipating that he will likely require opioid medication for the management of treatment-related mucositis, he would be at high risk for respiratory suppression and complications from concurrent usage of alcohol and opioid medications -Patient expressed understanding, and will continue to work toward alcohol abstinence over the next 1 to 2 weeks  Tobacco abuse -Patient has reduced his tobacco use down to only a few cigarettes over the past week; he is also vaping sparingly -I counseled the patient on the importance of abstinence from tobacco products, including vaping -Patient expressed understanding, and will continue to work toward tobacco abstinence over the next 1 to 2 weeks  Orders Placed This Encounter  Procedures  . IR GASTROSTOMY TUBE MOD SED    Standing Status:   Future    Standing Expiration Date:   12/28/2019    Order Specific Question:   Reason for exam:    Answer:   H&N cancer, starting chemoradiation    Order Specific Question:   Preferred Imaging Location?    Answer:   [facility]  . IR IMAGING GUIDED PORT INSERTION    Standing Status:   Future    Standing Expiration Date:   12/28/2019    Order Specific Question:   Reason for Exam (SYMPTOM  OR DIAGNOSIS REQUIRED)    Answer:   H&N cancer, starting chemoradiation    Order Specific Question:   Preferred Imaging Location?    Answer:   [facility]   . CBC with Differential ([place] Only)    Standing Status:   Standing    Number of Occurrences:   20    Standing Expiration Date:   10/28/2019  . Basic Metabolic Panel - [place] Only    Standing Status:   Standing    Number of Occurrences:   20    Standing Expiration Date:   10/28/2019  . PHYSICIAN COMMUNICATION ORDER    A baseline Audiogram is recommended prior to initiation of cisplatin chemotherapy.    I discussed the assessment and treatment plan with the patient. The patient was provided an opportunity to ask questions and all were answered. The patient agreed with the plan and demonstrated an understanding of the instructions.   The patient was advised to call back or seek an in-person evaluation if the symptoms worsen or if the condition fails to improve as anticipated.   I provided 40 minutes of face-to-face video visit time during this encounter, and > 50% was spent counseling as documented under my assessment & plan.   Return to clinic on 11/09/2018 for labs, port flush, and clinic appointment.  Tish Men, MD 10/28/2018 2:28 PM   CHIEF COMPLAINTS/PURPOSE OF CONSULTATION:  "I am doing alright"  HISTORY OF PRESENTING ILLNESS:  [name] [name] is 58 y.o. male who is being evaluated today for squamous cell carcinoma of the left tonsil.  Patient was recently seen by  Dr. Nat Christen of ENT at [facility], who reportedly felt that the patient was not a candidate for TORS due to the potential morbidities from the surgery.  However, patient has not yet received a phone call from [facility] to definitively confirm the and eligibility for surgery.  Patient reports that he still has mild left-sided throat soreness after recent tonsillectomy, for which she takes PRN Percocet with adequate pain relief.  Since the last clinic visit, he has cut back on his alcohol and tobacco use significantly.  He has only had 1 drink over the past week (down from 1/5 pint weekly), and has only smoked 1 or  2 cigarettes.  He denies any other complaint.  I have reviewed his chart and materials related to his cancer extensively and collaborated history with the patient. Summary of oncologic history is as follows:   Malignant neoplasm of tonsillar fossa ([place])   10/10/2018 Imaging    CT neck w/ contrast: FINDINGS: Pharynx and larynx: There is asymmetric prominence of the left tonsillar region. No other mucosal or submucosal lesion is seen.  Salivary glands: Parotid and submandibular glands are normal.  Thyroid: Normal  Lymph nodes: Definite adenopathy on the left. 2 level nodes which shows some internal necrosis. The larger measures 2.2 cm in diameter and the smaller measures 1.4 cm in diameter. Suspicious level 4 node image 83 just anterior to the jugular vein measuring 9 mm short axis diameter. No worrisome nodes on the right.    10/10/2018 Imaging    CT chest:  IMPRESSION: 1. Mild right hilar adenopathy, equivocal for metastasis. No additional potential evidence of metastatic disease in the chest. PET-CT could be considered for further staging evaluation, as clinically warranted. 2. 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 Ascending thoracic aortic aneurysm. Recommend semi-annual imaging followup by CTA or MRA and referral to cardiothoracic surgery if not already obtained. This recommendation follows 2010 ACCF/AHA/AATS/ACR/ASA/SCA/SCAI/SIR/STS/SVM Guidelines for the Diagnosis and Management of Patients With Thoracic Aortic Disease. Circulation. 2010; 121: Z366-Y403. Aortic aneurysm NOS (ICD10-I71.9).    10/17/2018 Initial Diagnosis    Tonsil cancer ([place])    10/18/2018 Imaging    PET: IMPRESSION: 1. 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 2. Local hypermetabolic nodal metastasis to the LEFT level II nodal station. 3. No contralateral nodal metastasis. 4. No evidence distant metastatic disease. 5. Two small pulmonary nodules in the  LEFT lower lobe are favored benign. Recommend attention on follow-up. 6. Several small common iliac lymph nodes with low metabolic activity. Favor reactive adenopathy.    10/21/2018 Cancer Staging    Staging form: Pharynx - HPV-Mediated Oropharynx, AJCC 8th Edition - Clinical: Stage I (cT2, cN1, cM0, p16+) - Signed by Eppie Gibson, MD on 10/21/2018    11/10/2018 -  Chemotherapy    The patient had palonosetron (ALOXI) injection 0.25 mg, 0.25 mg, Intravenous,  Once, 0 of 7 cycles CISplatin (PLATINOL) 100 mg in sodium chloride 0.9 % 500 mL chemo infusion, 40 mg/m2 = 100 mg, Intravenous,  Once, 0 of 7 cycles fosaprepitant (EMEND) 150 mg, dexamethasone (DECADRON) 12 mg in sodium chloride 0.9 % 145 mL IVPB, , Intravenous,  Once, 0 of 7 cycles  for chemotherapy treatment.      MEDICAL HISTORY:  Past Medical History:  Diagnosis Date  . 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  Benign essential HTN 01/04/2012  . Chronic anticoagulation 01/02/2013  . Dermatofibroma of forearm 01/02/2013   Left side  . Hyperlipidemia, mixed 01/04/2012  . 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 Primary hypercoagulable state ([place]) 10/01/2014  . Sinus bradycardia, chronic 01/04/2012  . Superficial thrombosis of lower extremity 05/02/2012    SURGICAL HISTORY: Past Surgical History:  Procedure Laterality Date  . DIRECT LARYNGOSCOPY Left 10/19/2018   Procedure: DIRECT LARYNGOSCOPY;  Surgeon: Leta Baptist, MD;  Location: [place];  Service: ENT;  Laterality: Left;  . TONSILLECTOMY Left 10/19/2018   Procedure: BIOPSY OF LEFT TONSIL;  Surgeon: Leta Baptist, MD;  Location: [place];  Service: ENT;  Laterality: Left;    SOCIAL HISTORY: Social History   Socioeconomic History  . Marital status: Divorced    Spouse name: Not on file   . Number of children: 2  . Years of education: Not on file  . Highest education level: Not on file  Occupational History  . Not on file  Social Needs  . Financial resource strain: Not on file  . Food insecurity:    Worry: Not on file    Inability: Not on file  . Transportation needs:    Medical: No    Non-medical: No  Tobacco Use  . Smoking status: Former Smoker    Packs/day: 0.50    Years: 30.00    Pack years: 15.00    Types: Cigarettes    Last attempt to quit: 10/19/2018    Years since quitting: 0.0  . Smokeless tobacco: Never Used  Substance and Sexual Activity  . Alcohol use: Yes    Alcohol/week: 13.0 - 14.0 standard drinks    Types: 12 Cans of beer, 1 - 2 Shots of liquor per week    Comment: 1-2 times per week.  . Drug use: No  . Sexual activity: Not on file  Lifestyle  . Physical activity:    Days per week: Not on file    Minutes per session: Not on file  . Stress: Not on file  Relationships  . Social connections:    Talks on phone: Not on file    Gets together: Not on file    Attends religious service: Not on file    Active member of club or organization: Not on file    Attends meetings of clubs or organizations: Not on file    Relationship status: Not on file  . Intimate partner violence:    Fear of current or ex partner: No    Emotionally abused: No    Physically abused: No    Forced sexual activity: No  Other Topics Concern  . Not on file  Social History Narrative  . Not on file    FAMILY HISTORY: Family History  Problem Relation Age of Onset  . Stroke Father     ALLERGIES:  has No Known Allergies.  MEDICATIONS:  Current Outpatient Medications  Medication Sig Dispense Refill  . amLODipine (NORVASC) 10 MG tablet TAKE 1 TABLET BY MOUTH EVERY DAY 90 tablet 2  . dexamethasone (DECADRON) 4 MG tablet Take 2 tablets by mouth once a day on the day after chemotherapy and then take 2 tablets two times a day for 2 days. Take with food. 30 tablet 1  .  lidocaine-prilocaine (EMLA) cream Apply to affected area once 30 g 3  . LORazepam (ATIVAN) 0.5 MG tablet Take 1 tablet (0.5 mg total) by mouth every 6 (six) hours as needed (Nausea  or vomiting). 30 tablet 0  . ondansetron (ZOFRAN) 8 MG tablet Take 1 tablet (8 mg total) by mouth 2 (two) times daily as needed. Start on the third day after chemotherapy. 30 tablet 1  . oxyCODONE-acetaminophen (PERCOCET) 5-325 MG tablet Take 1 tablet by mouth every 4 (four) hours as needed for severe pain. 25 tablet 0  . prochlorperazine (COMPAZINE) 10 MG tablet Take 1 tablet (10 mg total) by mouth every 6 (six) hours as needed (Nausea or vomiting). 30 tablet 1  . sodium fluoride (PREVIDENT 5000 PLUS) 1.1 % CREA dental cream Apply to tooth brush. Brush teeth for 2 minutes. Spit out excess. DO NOT rinse afterwards. Repeat nightly. 1 Tube prn  . warfarin (COUMADIN) 10 MG tablet Take 10 mg by mouth daily at 6 PM.     No current facility-administered medications for this visit.     REVIEW OF SYSTEMS:   Constitutional: ( - ) fevers, ( - )  chills , ( - ) night sweats Eyes: ( - ) blurriness of vision, ( - ) double vision, ( - ) watery eyes Ears, nose, mouth, throat, and face: ( - ) mucositis, ( - ) sore throat Respiratory: ( - ) cough, ( - ) dyspnea, ( - ) wheezes Cardiovascular: ( - ) palpitation, ( - ) chest discomfort, ( - ) lower extremity swelling Gastrointestinal:  ( - ) nausea, ( - ) heartburn, ( - ) change in bowel habits Skin: ( - ) abnormal skin rashes Lymphatics: ( - ) new lymphadenopathy, ( - ) easy bruising Neurological: ( - ) numbness, ( - ) tingling, ( - ) new weaknesses Behavioral/Psych: ( - ) mood change, ( - ) new changes  All other systems were reviewed with the patient and are negative.  PHYSICAL EXAMINATION: ECOG PERFORMANCE STATUS: 1 - Symptomatic but completely ambulatory  (The following exam findings are based on observation only due to virtual telemedicine visit)  GENERAL: alert, no  distress and comfortable RESPIRATORY: normal breathing effort PSYCH: alert & oriented x 3, fluent speech  LABORATORY DATA:  I have reviewed the data as listed Lab Results  Component Value Date   WBC 7.2 10/21/2018   HGB 18.1 (H) 10/21/2018   HCT 51.6 10/21/2018   MCV 93.0 10/21/2018   PLT 199 10/21/2018   Lab Results  Component Value Date   NA 138 10/21/2018   K 3.9 10/21/2018   CL 100 10/21/2018   CO2 28 10/21/2018    RADIOGRAPHIC STUDIES: I have personally reviewed the radiological images as listed and agreed with the findings in the report. Ct Soft Tissue Neck W Contrast  Result Date: 10/10/2018 CLINICAL DATA:  Localized swelling of the neck. EXAM: CT NECK WITH CONTRAST TECHNIQUE: Multidetector CT imaging of the neck was performed using the standard protocol following the bolus administration of intravenous contrast. CONTRAST:  144mL OMNIPAQUE IOHEXOL 300 MG/ML  SOLN COMPARISON:  None. FINDINGS: Pharynx and larynx: There is asymmetric prominence of the left tonsillar region. No other mucosal or submucosal lesion is seen. Salivary glands: Parotid and submandibular glands are normal. Thyroid: Normal Lymph nodes: Definite adenopathy on the left. 2 level nodes which shows some internal necrosis. The larger measures 2.2 cm in diameter and the smaller measures 1.4 cm in diameter. Suspicious level 4 node image 83 just anterior to the jugular vein measuring 9 mm short axis diameter. No worrisome nodes on the right. Vascular: No abnormal vascular finding. Limited intracranial: Normal Visualized orbits: Normal Mastoids and visualized paranasal  sinuses: Clear except for an insignificant retention cyst in the inferior left maxillary sinus. Skeleton: Normal Upper chest: Early emphysema.  See results of chest CT. Other: None IMPRESSION: Probable primary mass in the left tonsillar region. Lymphadenopathy on the left with some internal necrosis. Findings most consistent with P 16 positive squamous cell  carcinoma. Electronically Signed   By: Nelson Chimes M.D.   On: 10/10/2018 20:31   Ct Chest W Contrast  Result Date: 10/11/2018 CLINICAL DATA:  Head neck cancer. Left neck adenopathy. Current smoker. EXAM: CT CHEST WITH CONTRAST TECHNIQUE: Multidetector CT imaging of the chest was performed during intravenous contrast administration. CONTRAST:  139mL OMNIPAQUE IOHEXOL 300 MG/ML  SOLN COMPARISON:  10/15/2011 chest CT angiogram. FINDINGS: Cardiovascular: Normal heart size. No significant pericardial effusion/thickening. 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 Top-normal caliber main pulmonary artery (3.4 cm diameter). No central pulmonary emboli. Mediastinum/Nodes: No discrete thyroid nodules. Unremarkable esophagus. No pathologically enlarged axillary, mediastinal or hilar lymph nodes. Lungs/Pleura: No pneumothorax. No pleural effusion. Mild centrilobular and paraseptal emphysema with mild diffuse bronchial wall thickening. A few scattered solid pulmonary nodules in both lungs, largest 6 mm in the left lower lobe (series 4/image 104), all stable since 10/15/2011 chest CT, considered benign. No acute consolidative airspace disease, lung masses or new significant pulmonary nodules. Parenchymal banding at lung bases bilaterally, compatible with postinfectious/postinflammatory scarring. Upper abdomen: No acute abnormality. Musculoskeletal: No aggressive appearing focal osseous lesions. Mild thoracic spondylosis. IMPRESSION: 1. Mild right hilar adenopathy, equivocal for metastasis. No additional potential evidence of metastatic disease in the chest. PET-CT could be considered for further staging evaluation, as clinically warranted. 2. 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 Ascending thoracic aortic aneurysm. Recommend semi-annual imaging followup by CTA or MRA and referral to cardiothoracic surgery if not already obtained. This recommendation follows 2010 ACCF/AHA/AATS/ACR/ASA/SCA/SCAI/SIR/STS/SVM Guidelines for  the Diagnosis and Management of Patients With Thoracic Aortic Disease. Circulation. 2010; 121: V761-Y073. Aortic aneurysm NOS (ICD10-I71.9). Emphysema (ICD10-J43.9). Electronically Signed   By: Ilona Sorrel M.D.   On: 10/11/2018 08:15   Nm Pet Image Initial (pi) Skull Base To Thigh  Result Date: 10/18/2018 CLINICAL DATA:  Subsequent treatment strategy for LEFT neck lymphadenopathy EXAM: NUCLEAR MEDICINE PET SKULL BASE TO THIGH TECHNIQUE: 13.94 mCi F-18 FDG was injected intravenously. Full-ring PET imaging was performed from the skull base to thigh after the radiotracer. CT data was obtained and used for attenuation correction and anatomic localization. Fasting blood glucose: 118 mg/dl COMPARISON:  Neck CT 10/10/2018 FINDINGS: Mediastinal blood pool activity: SUV max 2.5 NECK: Elongated hypermetabolic thickening of the LEFT lingual tonsil with SUV max equal 14.3. Two adjacent hypermetabolic LEFT level II lymph nodes beneath the sternocleidomastoid muscle. The larger anterior node measures 18 mm with SUV max equal 10.6. Smaller posterior node measures 11 mm with SUV max equal 9.3. No contralateral hypermetabolic cervical lymph nodes. Incidental CT findings: none CHEST: No hypermetabolic mediastinal or hilar nodes. No suspicious pulmonary nodules on the CT scan. Incidental CT findings: 6 mm nodule in the LEFT lower lobe (image 46/8). Small adjacent LEFT lung base nodule measuring 5 mm (image 43/8). No associated metabolic activity the small lymph nodes pulmonary nodules. ABDOMEN/PELVIS: No no upper abdominal adenopathy. Normal liver pancreas spleen and kidneys. Small LEFT distal common iliac lymph node measuring 9 mm (image 190/4) has mild metabolic activity (SUV max equal 2.5) which is equal to background adjacent vascular activity (SUV max equal 2.5). Similar small lymph node in the RIGHT common iliac station (image 186/4. Prostate normal  Incidental CT findings: none SKELETON: No focal hypermetabolic activity to  suggest skeletal metastasis. Incidental CT findings: none IMPRESSION: 1. 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 2. Local hypermetabolic nodal metastasis to the LEFT level II nodal station. 3. No contralateral nodal metastasis. 4. No evidence distant metastatic disease. 5. Two small pulmonary nodules in the LEFT lower lobe are favored benign. Recommend attention on follow-up. 6. Several small common iliac lymph nodes with low metabolic activity. Favor reactive adenopathy. Electronically Signed   By: Suzy Bouchard M.D.   On: 10/18/2018 15:48    PATHOLOGY: I have reviewed the pathology reports as documented in the oncologist history.

## 2018-10-28 NOTE — Progress Notes (Signed)
START ON PATHWAY REGIMEN - Head and Neck     A cycle is every 7 days:     Cisplatin   **Always confirm dose/schedule in your pharmacy ordering system**  Patient Characteristics: Oropharynx, HPV Positive, Clinically Staged, T0-4, cN1-3 or T3-4, cN0 Disease Classification: Oropharynx HPV Status: Positive (+) AJCC N Category: cN1 AJCC 8 Stage Grouping: I Current Disease Status: No Distant Metastases and No Recurrent Disease AJCC T Category: T2 AJCC M Category: M0 Intent of Therapy: Curative Intent, Discussed with Patient

## 2018-10-28 NOTE — Progress Notes (Signed)
Head and Neck Cancer Simulation, IMRT treatment planning, and Special treatment procedure note   Outpatient  Diagnosis:    ICD-10-CM   1. 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 An Aquaplast head and shoulder mask was custom fitted to the patient's anatomy. High-resolution CT axial imaging was obtained of the head and neck with contrast. I verified that the quality of the imaging is good for treatment planning. 1 Medically Necessary Treatment Device was fabricated and supervised by me: Aquaplast mask.  Treatment planning note I plan to treat the patient with IMRT. I plan to treat the patient's tumor and bilateral neck nodes. I plan to treat to a total dose of 70 Gray in 35 fractions. Dose calculation was ordered from dosimetry.  IMRT planning Note  IMRT is medically necessary and an important modality to deliver adequate dose to the patient's at risk tissues while sparing the patient's normal structures, including the: esophagus, parotid tissue, mandible, brain stem, spinal cord, oral cavity, brachial plexus.  This justifies the use of IMRT in the patient's treatment.   Special Treatment Procedure Note:  The patient may be receiving chemotherapy concurrently. Chemotherapy heightens the risk of side effects. I have considered this during the patient's treatment planning process and will monitor the patient accordingly for side effects on a weekly basis. Concurrent chemotherapy increases the complexity of this patient's treatment and therefore this constitutes a special treatment procedure.  -----------------------------------  Eppie Gibson, MD

## 2018-10-31 ENCOUNTER — Other Ambulatory Visit: Payer: Self-pay | Admitting: Hematology

## 2018-10-31 ENCOUNTER — Telehealth: Payer: Self-pay | Admitting: *Deleted

## 2018-10-31 NOTE — Telephone Encounter (Signed)
Oncology Nurse Navigator Documentation  Following call from [facility] IR, called [name] [name], spoke with his SO as he was sleeping.  Explained because his daily coumadin must be held for 96 hrs, PAC placement has been rescheduled from this Thursday to Friday 12:00.  She confirmed he had not taken today's dose.  Further informed NPO after 8:00 am Friday morning except for small sips of water for necessary medications.  She voiced understanding.  Gayleen Orem, RN, BSN Head & Neck Oncology Nurse [place] at [place] [phone]

## 2018-11-01 ENCOUNTER — Telehealth: Payer: Self-pay | Admitting: *Deleted

## 2018-11-01 NOTE — Telephone Encounter (Signed)
Oncology Nurse Navigator Documentation  Returned call to Dr. Lennox Laity, [facility] Otolaryngology.  I confirmed understanding pt not TORS candidate, recommendation of [facility] Tumor Board he proceed with chemo/RT.  I informed he is scheduled to start RT 11/08/18 (70 Gray / 35 fxt) and chemotherapy 11/10/18 (LD cisplatin q7d).  She indicated she will schedule him for post-tmt follow-up in her clinic 6 weeks s/p projected EOT 12/27/2018 as needed.    Gayleen Orem, RN, BSN Head & Neck Oncology Nurse [place] at [place] [phone]

## 2018-11-02 ENCOUNTER — Telehealth: Payer: Self-pay | Admitting: *Deleted

## 2018-11-02 NOTE — Telephone Encounter (Signed)
Oncology Nurse Navigator Documentation  Rec'd call-back from [name] [name], informed him of referral to [facility] for PEG placement.  He voiced understanding he will receive call from Surgical Scheduling to arrange appt.  Gayleen Orem, RN, BSN Head & Neck Oncology Nurse [place] at [place] [phone]

## 2018-11-02 NOTE — Telephone Encounter (Signed)
Oncology Nurse Navigator Documentation  In follow-up to confirmation from H&N Oncology Nurse Navigator [facility] is presently placing PEGs, faxed per Surgical Scheduling's guidance, Drs. Squire's and Zhao's recent Progress Notes and PEG order.   Mr. Quillin to be contacted to schedule surgical consultation.  Notification of successful fax transmission received.   Gayleen Orem, RN, BSN Head & Neck Oncology Nurse [place] at [place] [phone]

## 2018-11-03 ENCOUNTER — Telehealth: Payer: Self-pay | Admitting: Hematology

## 2018-11-03 ENCOUNTER — Other Ambulatory Visit (HOSPITAL_COMMUNITY): Payer: Self-pay

## 2018-11-03 ENCOUNTER — Other Ambulatory Visit: Payer: Self-pay | Admitting: Physician Assistant

## 2018-11-03 NOTE — Telephone Encounter (Signed)
Scheduled appt per 4/13 sch message - called patient and left message with appt date and time

## 2018-11-04 ENCOUNTER — Ambulatory Visit (HOSPITAL_COMMUNITY)
Admission: RE | Admit: 2018-11-04 | Discharge: 2018-11-04 | Disposition: A | Discharge status: Home / Self Care | Payer: Medicaid Other | Source: Ambulatory Visit | Attending: Hematology | Admitting: Hematology

## 2018-11-04 ENCOUNTER — Encounter (HOSPITAL_COMMUNITY): Payer: Self-pay

## 2018-11-04 ENCOUNTER — Other Ambulatory Visit: Payer: Self-pay

## 2018-11-04 DIAGNOSIS — Z7901 Long term (current) use of anticoagulants: Secondary | ICD-10-CM | POA: Insufficient documentation

## 2018-11-04 DIAGNOSIS — E785 Hyperlipidemia, unspecified: Secondary | ICD-10-CM | POA: Diagnosis not present

## 2018-11-04 DIAGNOSIS — I739 Peripheral vascular disease, unspecified: Secondary | ICD-10-CM | POA: Insufficient documentation

## 2018-11-04 DIAGNOSIS — C090 Malignant neoplasm of tonsillar fossa: Secondary | ICD-10-CM | POA: Diagnosis not present

## 2018-11-04 DIAGNOSIS — F1010 Alcohol abuse, uncomplicated: Secondary | ICD-10-CM | POA: Diagnosis not present

## 2018-11-04 DIAGNOSIS — F17210 Nicotine dependence, cigarettes, uncomplicated: Secondary | ICD-10-CM | POA: Diagnosis not present

## 2018-11-04 DIAGNOSIS — C099 Malignant neoplasm of tonsil, unspecified: Secondary | ICD-10-CM | POA: Diagnosis not present

## 2018-11-04 DIAGNOSIS — I480 Paroxysmal atrial fibrillation: Secondary | ICD-10-CM | POA: Diagnosis not present

## 2018-11-04 DIAGNOSIS — Z86711 Personal history of pulmonary embolism: Secondary | ICD-10-CM | POA: Diagnosis not present

## 2018-11-04 DIAGNOSIS — Z79899 Other long term (current) drug therapy: Secondary | ICD-10-CM | POA: Diagnosis not present

## 2018-11-04 DIAGNOSIS — I10 Essential (primary) hypertension: Secondary | ICD-10-CM | POA: Diagnosis not present

## 2018-11-04 DIAGNOSIS — Z86718 Personal history of other venous thrombosis and embolism: Secondary | ICD-10-CM | POA: Diagnosis not present

## 2018-11-04 DIAGNOSIS — D6859 Other primary thrombophilia: Secondary | ICD-10-CM | POA: Insufficient documentation

## 2018-11-04 DIAGNOSIS — Z452 Encounter for adjustment and management of vascular access device: Secondary | ICD-10-CM | POA: Diagnosis not present

## 2018-11-04 HISTORY — PX: IR IMAGING GUIDED PORT INSERTION: IMG5740

## 2018-11-04 LAB — CBC
HCT: 53.3 % — ABNORMAL HIGH (ref 39.0–52.0)
Hemoglobin: 18.4 g/dL — ABNORMAL HIGH (ref 13.0–17.0)
MCH: 32.7 pg (ref 26.0–34.0)
MCHC: 34.5 g/dL (ref 30.0–36.0)
MCV: 94.7 fL (ref 80.0–100.0)
Platelets: 202 10*3/uL (ref 150–400)
RBC: 5.63 MIL/uL (ref 4.22–5.81)
RDW: 13.4 % (ref 11.5–15.5)
WBC: 6.4 10*3/uL (ref 4.0–10.5)
nRBC: 0 % (ref 0.0–0.2)

## 2018-11-04 LAB — PROTIME-INR
INR: 1.1 (ref 0.8–1.2)
Prothrombin Time: 13.6 seconds (ref 11.4–15.2)

## 2018-11-04 MED ORDER — CEFAZOLIN SODIUM-DEXTROSE 2-4 GM/100ML-% IV SOLN
2.0000 g | Freq: Once | INTRAVENOUS | Status: AC
  Administered 2018-11-04: 15:00:00 2 g via INTRAVENOUS

## 2018-11-04 MED ORDER — MIDAZOLAM HCL 2 MG/2ML IJ SOLN
INTRAMUSCULAR | Status: AC
  Filled 2018-11-04: qty 4

## 2018-11-04 MED ORDER — HEPARIN SOD (PORK) LOCK FLUSH 100 UNIT/ML IV SOLN
INTRAVENOUS | Status: AC | PRN
  Administered 2018-11-04: 500 [IU] via INTRAVENOUS

## 2018-11-04 MED ORDER — HEPARIN SOD (PORK) LOCK FLUSH 100 UNIT/ML IV SOLN
INTRAVENOUS | Status: AC
  Filled 2018-11-04: qty 5

## 2018-11-04 MED ORDER — FENTANYL CITRATE (PF) 100 MCG/2ML IJ SOLN
INTRAMUSCULAR | Status: AC | PRN
  Administered 2018-11-04 (×2): 50 ug via INTRAVENOUS

## 2018-11-04 MED ORDER — FENTANYL CITRATE (PF) 100 MCG/2ML IJ SOLN
INTRAMUSCULAR | Status: AC
  Filled 2018-11-04: qty 2

## 2018-11-04 MED ORDER — LIDOCAINE-EPINEPHRINE (PF) 2 %-1:200000 IJ SOLN
INTRAMUSCULAR | Status: AC | PRN
  Administered 2018-11-04 (×2): 10 mL

## 2018-11-04 MED ORDER — LIDOCAINE-EPINEPHRINE (PF) 2 %-1:200000 IJ SOLN
INTRAMUSCULAR | Status: AC
  Filled 2018-11-04: qty 20

## 2018-11-04 MED ORDER — MIDAZOLAM HCL 2 MG/2ML IJ SOLN
INTRAMUSCULAR | Status: AC | PRN
  Administered 2018-11-04 (×5): 1 mg via INTRAVENOUS

## 2018-11-04 MED ORDER — MIDAZOLAM HCL 2 MG/2ML IJ SOLN
INTRAMUSCULAR | Status: AC
  Filled 2018-11-04: qty 2

## 2018-11-04 MED ORDER — SODIUM CHLORIDE 0.9 % IV SOLN
INTRAVENOUS | Status: DC
  Administered 2018-11-04: 14:00:00 via INTRAVENOUS

## 2018-11-04 MED ORDER — CEFAZOLIN SODIUM-DEXTROSE 2-4 GM/100ML-% IV SOLN
INTRAVENOUS | Status: AC
  Administered 2018-11-04: 15:00:00 2 g via INTRAVENOUS
  Filled 2018-11-04: qty 100

## 2018-11-04 NOTE — Procedures (Signed)
Interventional Radiology Procedure Note  Procedure: Placement of a right IJ approach single lumen PowerPort.  Tip is positioned at the superior cavoatrial junction and catheter is ready for immediate use.  Complications: No immediate Recommendations:  - Ok to shower tomorrow - Do not submerge for 7 days - Routine line care   Signed,  Heath K. McCullough, MD   

## 2018-11-04 NOTE — H&P (Signed)
Chief Complaint: Patient was seen in consultation today for port placement.  Referring Physician(s): Zhao,Yan  Supervising Physician: Jacqulynn Cadet  Patient Status: [facility] - Out-pt  History of Present Illness: [name] [name] is a 58 y.o. male with a past medical history significant for ETOH abuse, tobacco abuse, paroxysmal a.fib, PVD, idiopathy hypercoaguable state with recurrent DVTs/PEs, HTN, HLD, medical noncompliance and squamous cell carcinoma of the left tonsil followed by Dr. Maylon Peppers who presents today for port placement. [name] began to experience a persistent sore throat in February of this year, he was seen in on 10/03/18 by Dr. Beryle Beams for a routine hematology follow up where he was noted to have an enlarged left cervical lymph node. A CT of neck and chest was performed on 10/10/18 which showed a probable primary mass in the left tonsillar region, lymphadenopathy on the left with some interval necrosis, mild right hilar adenopathy and an ascending aortic aneurysm. He then underwent PET scan on 10/18/18 which showed hypermetabolic left lingual tonsil fullness consistent with primary carcinoma, local hypermetabolic nodal metastasis to the left level II nodal station, two small pulmonary nodules in the left lower lobe favored to be benign and several small common iliac lymph nodes with low metabolic activity favor reactive adenopathy. He ultimately underwent a left tonsillar mass biopsy with Dr. Benjamine Mola on 10/19/18 and pathology from this biopsy showed invasive moderately differentiated squamous cell carcinoma. He is now followed by Dr. Maylon Peppers with plans for systemic chemotherapy. IR has been asked to place a port today.  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   Right SFA 05/07/17 while on warfarin INR 2.9   Benign  essential HTN 01/04/2012   Chronic anticoagulation 01/02/2013   Dermatofibroma of forearm 01/02/2013   Left side   Hyperlipidemia, mixed 01/04/2012   Polycythemia secondary to smoking 01/04/2012   Primary hypercoagulable state ([place]) 10/01/2014   Sinus bradycardia, chronic 01/04/2012   Superficial thrombosis of lower extremity 05/02/2012    Past Surgical History:  Procedure Laterality Date   DIRECT LARYNGOSCOPY Left 10/19/2018   Procedure: DIRECT LARYNGOSCOPY;  Surgeon: Leta Baptist, MD;  Location: [place];  Service: ENT;  Laterality: Left;   TONSILLECTOMY Left 10/19/2018   Procedure: BIOPSY OF LEFT TONSIL;  Surgeon: Leta Baptist, MD;  Location: [place];  Service: ENT;  Laterality: Left;    Allergies: Patient has no known allergies.  Medications: Prior to Admission medications   Medication Sig Start Date End Date Taking? Authorizing Provider  amLODipine (NORVASC) 10 MG tablet TAKE 1 TABLET BY MOUTH EVERY DAY 04/08/18   Annia Belt, MD  dexamethasone (DECADRON) 4 MG tablet Take 2 tablets by mouth once a day on the day after chemotherapy and then take 2 tablets two times a day for 2 days. Take with food. 10/28/18   Tish Men, MD  lidocaine-prilocaine (EMLA) cream Apply to affected area once 10/28/18   Tish Men, MD  LORazepam (ATIVAN) 0.5 MG tablet Take 1 tablet (0.5 mg total) by mouth every 6 (six) hours as needed (Nausea or vomiting). 10/28/18   Tish Men, MD  ondansetron (ZOFRAN) 8 MG tablet Take 1 tablet (8 mg total) by mouth 2 (two) times daily as needed. Start on the third day after chemotherapy. 10/28/18   Tish Men, MD  oxyCODONE-acetaminophen (PERCOCET) 5-325 MG tablet  Take 1 tablet by mouth every 4 (four) hours as needed for severe pain. 10/19/18   Leta Baptist, MD  prochlorperazine (COMPAZINE) 10 MG tablet Take 1 tablet (10 mg total) by mouth every 6 (six) hours as needed (Nausea or vomiting). 10/28/18   Tish Men, MD  sodium fluoride (PREVIDENT 5000  PLUS) 1.1 % CREA dental cream Apply to tooth brush. Brush teeth for 2 minutes. Spit out excess. DO NOT rinse afterwards. Repeat nightly. 10/24/18   Lenn Cal, DDS  warfarin (COUMADIN) 10 MG tablet Take 10 mg by mouth daily at 6 PM.    [provider]     Family History  Problem Relation Age of Onset   Stroke Father     Social History   Socioeconomic History   Marital status: Divorced    Spouse name: Not on file   Number of children: 2   Years of education: Not on file   Highest education level: Not on file  Occupational History   Not on file  Social Needs   Financial resource strain: Not on file   Food insecurity:    Worry: Not on file    Inability: Not on file   Transportation needs:    Medical: No    Non-medical: No  Tobacco Use   Smoking status: Former Smoker    Packs/day: 0.50    Years: 30.00    Pack years: 15.00    Types: Cigarettes    Last attempt to quit: 10/19/2018    Years since quitting: 0.0   Smokeless tobacco: Never Used  Substance and Sexual Activity   Alcohol use: Yes    Alcohol/week: 13.0 - 14.0 standard drinks    Types: 12 Cans of beer, 1 - 2 Shots of liquor per week    Comment: 1-2 times per week.   Drug use: No   Sexual activity: Not on file  Lifestyle   Physical activity:    Days per week: Not on file    Minutes per session: Not on file   Stress: Not on file  Relationships   Social connections:    Talks on phone: Not on file    Gets together: Not on file    Attends religious service: Not on file    Active member of club or organization: Not on file    Attends meetings of clubs or organizations: Not on file    Relationship status: Not on file  Other Topics Concern   Not on file  Social History Narrative   Not on file     Review of Systems: A 12 point ROS discussed and pertinent positives are indicated in the HPI above.  All other systems are negative.  Review of Systems  Constitutional: Positive for  diaphoresis (patient reports being "hot natured" and is often sweating). Negative for appetite change, chills and fever.  HENT: Positive for sore throat (present ever since tonsillar biopsy).   Respiratory: Negative for cough and shortness of breath.   Gastrointestinal: Negative for abdominal pain, diarrhea, nausea and vomiting.  Musculoskeletal: Negative for back pain.  Skin: Negative for color change.  Neurological: Negative for dizziness, syncope and headaches.    Vital Signs: BP (!) 130/92    Pulse 89    Temp 97.9 F (36.6 C) (Oral)    Resp 18    SpO2 98%   Physical Exam Vitals signs reviewed.  Constitutional:      General: He is not in acute distress.    Appearance: He  is diaphoretic.     Comments: Appears anxious, fidgeting throughout exam. Pleasant, good historian.  HENT:     Head: Normocephalic.  Cardiovascular:     Rate and Rhythm: Normal rate and regular rhythm.  Pulmonary:     Effort: Pulmonary effort is normal.     Breath sounds: Normal breath sounds.  Abdominal:     Palpations: Abdomen is soft.     Tenderness: There is no abdominal tenderness.  Skin:    General: Skin is warm.     Coloration: Skin is not jaundiced or pale.     Comments: Appears flushed  Neurological:     Mental Status: He is alert and oriented to person, place, and time.  Psychiatric:        Mood and Affect: Mood normal.        Behavior: Behavior normal.        Thought Content: Thought content normal.        Judgment: Judgment normal.      MD Evaluation Airway: WNL Heart: WNL Abdomen: WNL Chest/ Lungs: WNL ASA  Classification: 3 Mallampati/Airway Score: Two   Imaging: Ct Soft Tissue Neck W Contrast  Result Date: 10/10/2018 CLINICAL DATA:  Localized swelling of the neck. EXAM: CT NECK WITH CONTRAST TECHNIQUE: Multidetector CT imaging of the neck was performed using the standard protocol following the bolus administration of intravenous contrast. CONTRAST:  133mL OMNIPAQUE IOHEXOL 300  MG/ML  SOLN COMPARISON:  None. FINDINGS: Pharynx and larynx: There is asymmetric prominence of the left tonsillar region. No other mucosal or submucosal lesion is seen. Salivary glands: Parotid and submandibular glands are normal. Thyroid: Normal Lymph nodes: Definite adenopathy on the left. 2 level nodes which shows some internal necrosis. The larger measures 2.2 cm in diameter and the smaller measures 1.4 cm in diameter. Suspicious level 4 node image 83 just anterior to the jugular vein measuring 9 mm short axis diameter. No worrisome nodes on the right. Vascular: No abnormal vascular finding. Limited intracranial: Normal Visualized orbits: Normal Mastoids and visualized paranasal sinuses: Clear except for an insignificant retention cyst in the inferior left maxillary sinus. Skeleton: Normal Upper chest: Early emphysema.  See results of chest CT. Other: None IMPRESSION: Probable primary mass in the left tonsillar region. Lymphadenopathy on the left with some internal necrosis. 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 Electronically Signed   By: Nelson Chimes M.D.   On: 10/10/2018 20:31   Ct Chest W Contrast  Result Date: 10/11/2018 CLINICAL DATA:  Head neck cancer. Left neck adenopathy. Current smoker. EXAM: CT CHEST WITH CONTRAST TECHNIQUE: Multidetector CT imaging of the chest was performed during intravenous contrast administration. CONTRAST:  127mL OMNIPAQUE IOHEXOL 300 MG/ML  SOLN COMPARISON:  10/15/2011 chest CT angiogram. FINDINGS: Cardiovascular: Normal heart size. No significant pericardial effusion/thickening. 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 Top-normal caliber main pulmonary artery (3.4 cm diameter). No central pulmonary emboli. Mediastinum/Nodes: No discrete thyroid nodules. Unremarkable esophagus. No pathologically enlarged axillary, mediastinal or hilar lymph nodes. Lungs/Pleura: No pneumothorax. No pleural effusion. Mild centrilobular and paraseptal emphysema  with mild diffuse bronchial wall thickening. A few scattered solid pulmonary nodules in both lungs, largest 6 mm in the left lower lobe (series 4/image 104), all stable since 10/15/2011 chest CT, considered benign. No acute consolidative airspace disease, lung masses or new significant pulmonary nodules. Parenchymal banding at lung bases bilaterally, compatible with postinfectious/postinflammatory scarring. Upper abdomen: No acute abnormality. Musculoskeletal: No aggressive appearing focal osseous lesions. Mild thoracic spondylosis. IMPRESSION:  1. Mild right hilar adenopathy, equivocal for metastasis. No additional potential evidence of metastatic disease in the chest. PET-CT could be considered for further staging evaluation, as clinically warranted. 2. 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 Ascending thoracic aortic aneurysm. Recommend semi-annual imaging followup by CTA or MRA and referral to cardiothoracic surgery if not already obtained. This recommendation follows 2010 ACCF/AHA/AATS/ACR/ASA/SCA/SCAI/SIR/STS/SVM Guidelines for the Diagnosis and Management of Patients With Thoracic Aortic Disease. Circulation. 2010; 121: B716-R678. Aortic aneurysm NOS (ICD10-I71.9). Emphysema (ICD10-J43.9). Electronically Signed   By: Ilona Sorrel M.D.   On: 10/11/2018 08:15   Nm Pet Image Initial (pi) Skull Base To Thigh  Result Date: 10/18/2018 CLINICAL DATA:  Subsequent treatment strategy for LEFT neck lymphadenopathy EXAM: NUCLEAR MEDICINE PET SKULL BASE TO THIGH TECHNIQUE: 13.94 mCi F-18 FDG was injected intravenously. Full-ring PET imaging was performed from the skull base to thigh after the radiotracer. CT data was obtained and used for attenuation correction and anatomic localization. Fasting blood glucose: 118 mg/dl COMPARISON:  Neck CT 10/10/2018 FINDINGS: Mediastinal blood pool activity: SUV max 2.5 NECK: Elongated hypermetabolic thickening of the LEFT lingual tonsil with SUV max equal 14.3. Two adjacent  hypermetabolic LEFT level II lymph nodes beneath the sternocleidomastoid muscle. The larger anterior node measures 18 mm with SUV max equal 10.6. Smaller posterior node measures 11 mm with SUV max equal 9.3. No contralateral hypermetabolic cervical lymph nodes. Incidental CT findings: none CHEST: No hypermetabolic mediastinal or hilar nodes. No suspicious pulmonary nodules on the CT scan. Incidental CT findings: 6 mm nodule in the LEFT lower lobe (image 46/8). Small adjacent LEFT lung base nodule measuring 5 mm (image 43/8). No associated metabolic activity the small lymph nodes pulmonary nodules. ABDOMEN/PELVIS: No no upper abdominal adenopathy. Normal liver pancreas spleen and kidneys. Small LEFT distal common iliac lymph node measuring 9 mm (image 190/4) has mild metabolic activity (SUV max equal 2.5) which is equal to background adjacent vascular activity (SUV max equal 2.5). Similar small lymph node in the RIGHT common iliac station (image 186/4. Prostate normal Incidental CT findings: none SKELETON: No focal hypermetabolic activity to suggest skeletal metastasis. Incidental CT findings: none IMPRESSION: 1. 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 2. Local hypermetabolic nodal metastasis to the LEFT level II nodal station. 3. No contralateral nodal metastasis. 4. No evidence distant metastatic disease. 5. Two small pulmonary nodules in the LEFT lower lobe are favored benign. Recommend attention on follow-up. 6. Several small common iliac lymph nodes with low metabolic activity. Favor reactive adenopathy. Electronically Signed   By: Suzy Bouchard M.D.   On: 10/18/2018 15:48    Labs:  CBC: Recent Labs    10/03/18 1450 10/21/18 1148 11/04/18 1313  WBC 7.3 7.2 6.4  HGB 18.7* 18.1* 18.4*  HCT 52.2* 51.6 53.3*  PLT 230 199 202    COAGS: Recent Labs    10/03/18 1450 10/18/18 1530 10/21/18 1152 11/04/18 1313  INR 2.3* 1.4* 2.0* 1.1    BMP: Recent Labs     10/03/18 1450 10/21/18 1148  NA 142 138  K 4.5 3.9  CL 99 100  CO2 23 28  GLUCOSE 85 98  BUN 15 18  CALCIUM 9.9 9.1  CREATININE 1.10 0.76  GFRNONAA 74 >60  GFRAA 85 >60    LIVER FUNCTION TESTS: Recent Labs    10/03/18 1450 10/21/18 1148  BILITOT 0.8 0.6  AST 33 20  ALT 31 19  ALKPHOS 93 73  PROT 7.2 7.4  ALBUMIN 4.3 4.4  TUMOR MARKERS: No results for input(s): AFPTM, CEA, CA199, CHROMGRNA in the last 8760 hours.  Assessment and Plan:   58 y/o M with recently diagnosed left tonsillar squamous cell carcinoma followed by Dr. Maylon Peppers who presents today for port placement to begin chemotherapy.  Patient has been NPO since 7 am today, he took a pain pill with a small sip of water this morning around 10 am, last dose of coumadin was 4/19. Afebrile, WBC 6.4, hgb 18.4, plt 202, INR 1.1.  Risks and benefits of image guided port-a-catheter placement was discussed with the patient including, but not limited to bleeding, infection, pneumothorax, or fibrin sheath development and need for additional procedures.  All of the patient's questions were answered, patient is agreeable to proceed.  Consent signed and in chart.  Thank you for this interesting consult.  I greatly enjoyed meeting BRYCE CHEEVER and look forward to participating in their care.  A copy of this report was sent to the requesting provider on this date.  Electronically Signed: Joaquim Nam, PA-C 11/04/2018, 1:08 PM   I spent a total of  30 Minutes  in face to face in clinical consultation, greater than 50% of which was counseling/coordinating care for port placement.

## 2018-11-04 NOTE — Discharge Instructions (Signed)
Moderate Conscious Sedation, Adult, Care After °These instructions provide you with information about caring for yourself after your procedure. Your health care provider may also give you more specific instructions. Your treatment has been planned according to current medical practices, but problems sometimes occur. Call your health care provider if you have any problems or questions after your procedure. °What can I expect after the procedure? °After your procedure, it is common: °· To feel sleepy for several hours. °· To feel clumsy and have poor balance for several hours. °· To have poor judgment for several hours. °· To vomit if you eat too soon. °Follow these instructions at home: °For at least 24 hours after the procedure: ° °· Do not: °? Participate in activities where you could fall or become injured. °? Drive. °? Use heavy machinery. °? Drink alcohol. °? Take sleeping pills or medicines that cause drowsiness. °? Make important decisions or sign legal documents. °? Take care of children on your own. °· Rest. °Eating and drinking °· Follow the diet recommended by your health care provider. °· If you vomit: °? Drink water, juice, or soup when you can drink without vomiting. °? Make sure you have little or no nausea before eating solid foods. °General instructions °· Have a responsible adult stay with you until you are awake and alert. °· Take over-the-counter and prescription medicines only as told by your health care provider. °· If you smoke, do not smoke without supervision. °· Keep all follow-up visits as told by your health care provider. This is important. °Contact a health care provider if: °· You keep feeling nauseous or you keep vomiting. °· You feel light-headed. °· You develop a rash. °· You have a fever. °Get help right away if: °· You have trouble breathing. °This information is not intended to replace advice given to you by your health care provider. Make sure you discuss any questions you have  with your health care provider. °Document Released: 04/26/2013 Document Revised: 12/09/2015 Document Reviewed: 10/26/2015 °[employer] Interactive Patient Education © 2019 [employer] ° ° °You may shower and remove your dressing tomorrow.  DO NOT use EMLA cream for 2 weeks after port placement as this cream will remove surgical glue on your incision. ° ° °Implanted Port Insertion, Care After °This sheet gives you information about how to care for yourself after your procedure. Your health care provider may also give you more specific instructions. If you have problems or questions, contact your health care provider. °What can I expect after the procedure? °After the procedure, it is common to have: °· Discomfort at the port insertion site. °· Bruising on the skin over the port. This should improve over 3-4 days. °Follow these instructions at home: °Port care °· After your port is placed, you will get a manufacturer's information card. The card has information about your port. Keep this card with you at all times. °· Take care of the port as told by your health care provider. Ask your health care provider if you or a family member can get training for taking care of the port at home. A home health care nurse may also take care of the port. °· Make sure to remember what type of port you have. °Incision care ° °  ° °· Follow instructions from your health care provider about how to take care of your port insertion site. Make sure you: °? Wash your hands with soap and water before and after you change your bandage (dressing). If   soap and water are not available, use hand sanitizer. °? Change your dressing as told by your health care provider. °? Leave stitches (sutures), skin glue, or adhesive strips in place. These skin closures may need to stay in place for 2 weeks or longer. If adhesive strip edges start to loosen and curl up, you may trim the loose edges. Do not remove adhesive strips completely unless your health  care provider tells you to do that. °· Check your port insertion site every day for signs of infection. Check for: °? Redness, swelling, or pain. °? Fluid or blood. °? Warmth. °? Pus or a bad smell. °Activity °· Return to your normal activities as told by your health care provider. Ask your health care provider what activities are safe for you. °· Do not lift anything that is heavier than 10 lb (4.5 kg), or the limit that you are told, until your health care provider says that it is safe. °General instructions °· Take over-the-counter and prescription medicines only as told by your health care provider. °· Do not take baths, swim, or use a hot tub until your health care provider approves. Ask your health care provider if you may take showers. You may only be allowed to take sponge baths. °· Do not drive for 24 hours if you were given a sedative during your procedure. °· Wear a medical alert bracelet in case of an emergency. This will tell any health care providers that you have a port. °· Keep all follow-up visits as told by your health care provider. This is important. °Contact a health care provider if: °· You cannot flush your port with saline as directed, or you cannot draw blood from the port. °· You have a fever or chills. °· You have redness, swelling, or pain around your port insertion site. °· You have fluid or blood coming from your port insertion site. °· Your port insertion site feels warm to the touch. °· You have pus or a bad smell coming from the port insertion site. °Get help right away if: °· You have chest pain or shortness of breath. °· You have bleeding from your port that you cannot control. °Summary °· Take care of the port as told by your health care provider. Keep the manufacturer's information card with you at all times. °· Change your dressing as told by your health care provider. °· Contact a health care provider if you have a fever or chills or if you have redness, swelling, or pain  around your port insertion site. °· Keep all follow-up visits as told by your health care provider. °This information is not intended to replace advice given to you by your health care provider. Make sure you discuss any questions you have with your health care provider. °Document Released: 04/26/2013 Document Revised: 02/01/2018 Document Reviewed: 02/01/2018 °[employer] Interactive Patient Education © 2019 [employer] ° °

## 2018-11-07 ENCOUNTER — Ambulatory Visit: Payer: Medicaid Other | Admitting: Radiation Oncology

## 2018-11-08 ENCOUNTER — Ambulatory Visit
Admission: RE | Admit: 2018-11-08 | Discharge: 2018-11-08 | Disposition: A | Discharge status: Home / Self Care | Payer: Medicaid Other | Source: Ambulatory Visit | Attending: Radiation Oncology | Admitting: Radiation Oncology

## 2018-11-08 ENCOUNTER — Other Ambulatory Visit: Payer: Self-pay

## 2018-11-08 DIAGNOSIS — D6859 Other primary thrombophilia: Secondary | ICD-10-CM | POA: Diagnosis not present

## 2018-11-08 DIAGNOSIS — I10 Essential (primary) hypertension: Secondary | ICD-10-CM | POA: Diagnosis not present

## 2018-11-08 DIAGNOSIS — Z7901 Long term (current) use of anticoagulants: Secondary | ICD-10-CM | POA: Diagnosis not present

## 2018-11-08 DIAGNOSIS — Z86718 Personal history of other venous thrombosis and embolism: Secondary | ICD-10-CM | POA: Diagnosis not present

## 2018-11-08 DIAGNOSIS — Z86711 Personal history of pulmonary embolism: Secondary | ICD-10-CM | POA: Diagnosis not present

## 2018-11-08 DIAGNOSIS — Z87891 Personal history of nicotine dependence: Secondary | ICD-10-CM | POA: Diagnosis not present

## 2018-11-08 DIAGNOSIS — C770 Secondary and unspecified malignant neoplasm of lymph nodes of head, face and neck: Secondary | ICD-10-CM | POA: Diagnosis not present

## 2018-11-08 DIAGNOSIS — Z510 Encounter for antineoplastic radiation therapy: Secondary | ICD-10-CM | POA: Diagnosis not present

## 2018-11-08 DIAGNOSIS — Z79899 Other long term (current) drug therapy: Secondary | ICD-10-CM | POA: Diagnosis not present

## 2018-11-08 DIAGNOSIS — C090 Malignant neoplasm of tonsillar fossa: Secondary | ICD-10-CM | POA: Diagnosis not present

## 2018-11-09 ENCOUNTER — Inpatient Hospital Stay: Payer: Medicaid Other

## 2018-11-09 ENCOUNTER — Encounter: Payer: Self-pay | Admitting: Hematology

## 2018-11-09 ENCOUNTER — Ambulatory Visit
Admission: RE | Admit: 2018-11-09 | Discharge: 2018-11-09 | Disposition: A | Discharge status: Home / Self Care | Payer: Medicaid Other | Source: Ambulatory Visit | Attending: Radiation Oncology | Admitting: Radiation Oncology

## 2018-11-09 ENCOUNTER — Inpatient Hospital Stay (HOSPITAL_BASED_OUTPATIENT_CLINIC_OR_DEPARTMENT_OTHER): Payer: Medicaid Other | Admitting: Hematology

## 2018-11-09 ENCOUNTER — Other Ambulatory Visit: Payer: Self-pay

## 2018-11-09 VITALS — BP 134/100 | HR 57 | Temp 97.4°F | Resp 17 | Ht 75.0 in | Wt 259.9 lb

## 2018-11-09 DIAGNOSIS — C090 Malignant neoplasm of tonsillar fossa: Secondary | ICD-10-CM | POA: Diagnosis not present

## 2018-11-09 DIAGNOSIS — D751 Secondary polycythemia: Secondary | ICD-10-CM | POA: Diagnosis not present

## 2018-11-09 DIAGNOSIS — Z5111 Encounter for antineoplastic chemotherapy: Secondary | ICD-10-CM | POA: Diagnosis not present

## 2018-11-09 DIAGNOSIS — Z87891 Personal history of nicotine dependence: Secondary | ICD-10-CM | POA: Diagnosis not present

## 2018-11-09 DIAGNOSIS — I82401 Acute embolism and thrombosis of unspecified deep veins of right lower extremity: Secondary | ICD-10-CM | POA: Diagnosis not present

## 2018-11-09 DIAGNOSIS — I10 Essential (primary) hypertension: Secondary | ICD-10-CM | POA: Diagnosis not present

## 2018-11-09 DIAGNOSIS — Z79899 Other long term (current) drug therapy: Secondary | ICD-10-CM | POA: Diagnosis not present

## 2018-11-09 DIAGNOSIS — Z86711 Personal history of pulmonary embolism: Secondary | ICD-10-CM | POA: Diagnosis not present

## 2018-11-09 DIAGNOSIS — F1010 Alcohol abuse, uncomplicated: Secondary | ICD-10-CM | POA: Diagnosis not present

## 2018-11-09 DIAGNOSIS — I825Y1 Chronic embolism and thrombosis of unspecified deep veins of right proximal lower extremity: Secondary | ICD-10-CM

## 2018-11-09 DIAGNOSIS — C770 Secondary and unspecified malignant neoplasm of lymph nodes of head, face and neck: Secondary | ICD-10-CM | POA: Diagnosis not present

## 2018-11-09 DIAGNOSIS — Z720 Tobacco use: Secondary | ICD-10-CM

## 2018-11-09 DIAGNOSIS — Z510 Encounter for antineoplastic radiation therapy: Secondary | ICD-10-CM | POA: Diagnosis not present

## 2018-11-09 DIAGNOSIS — Z86718 Personal history of other venous thrombosis and embolism: Secondary | ICD-10-CM | POA: Diagnosis not present

## 2018-11-09 DIAGNOSIS — D6859 Other primary thrombophilia: Secondary | ICD-10-CM | POA: Diagnosis not present

## 2018-11-09 DIAGNOSIS — Z7901 Long term (current) use of anticoagulants: Secondary | ICD-10-CM | POA: Diagnosis not present

## 2018-11-09 LAB — BASIC METABOLIC PANEL - CANCER CENTER ONLY
Anion gap: 10 (ref 5–15)
BUN: 14 mg/dL (ref 6–20)
CO2: 28 mmol/L (ref 22–32)
Calcium: 9.6 mg/dL (ref 8.9–10.3)
Chloride: 101 mmol/L (ref 98–111)
Creatinine: 0.99 mg/dL (ref 0.61–1.24)
GFR, Est AFR Am: >60 mL/min (ref >60)
GFR, Estimated: >60 mL/min (ref >60)
Glucose, Bld: 100 mg/dL — ABNORMAL HIGH (ref 70–99)
Potassium: 4.6 mmol/L (ref 3.5–5.1)
Sodium: 139 mmol/L (ref 135–145)

## 2018-11-09 LAB — CBC WITH DIFFERENTIAL (CANCER CENTER ONLY)
Abs Immature Granulocytes: 0.01 10*3/uL (ref 0.00–0.07)
Basophils Absolute: 0 10*3/uL (ref 0.0–0.1)
Basophils Relative: 1 %
Eosinophils Absolute: 0.1 10*3/uL (ref 0.0–0.5)
Eosinophils Relative: 2 %
HCT: 53.2 % — ABNORMAL HIGH (ref 39.0–52.0)
Hemoglobin: 18 g/dL — ABNORMAL HIGH (ref 13.0–17.0)
Immature Granulocytes: 0 %
Lymphocytes Relative: 14 %
Lymphs Abs: 1 10*3/uL (ref 0.7–4.0)
MCH: 32.3 pg (ref 26.0–34.0)
MCHC: 33.8 g/dL (ref 30.0–36.0)
MCV: 95.5 fL (ref 80.0–100.0)
Monocytes Absolute: 1.1 10*3/uL — ABNORMAL HIGH (ref 0.1–1.0)
Monocytes Relative: 15 %
Neutro Abs: 4.9 10*3/uL (ref 1.7–7.7)
Neutrophils Relative %: 68 %
Platelet Count: 192 10*3/uL (ref 150–400)
RBC: 5.57 MIL/uL (ref 4.22–5.81)
RDW: 13 % (ref 11.5–15.5)
WBC Count: 7.1 10*3/uL (ref 4.0–10.5)
nRBC: 0 % (ref 0.0–0.2)

## 2018-11-09 LAB — MAGNESIUM: Magnesium: 1.9 mg/dL (ref 1.7–2.4)

## 2018-11-09 MED ORDER — SONAFINE EX EMUL
1.0000 "application " | Freq: Once | CUTANEOUS | Status: AC
  Administered 2018-11-09: 1 via TOPICAL

## 2018-11-09 NOTE — Progress Notes (Signed)
[place] OFFICE PROGRESS NOTE  Patient Care Team: Patient, No Pcp Per as PCP - General (General Practice) Annia Belt, MD as Consulting Physician (Oncology)  HEME/ONC OVERVIEW: 1. 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 Melene Plan, invasive SCCa, p16+; not a candidate for TORS per ENT  -Late 10/2018 - present: definitive chemoradiation with weekly cisplatin   2. 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 Port placed in 10/2018; PEG not yet scheduled   TREATMENT REGIMEN:  Early 2000 - present: warfarin, questionable compliance   11/08/2018 - present: definitive chemoradiation with weekly cisplatin   PERTINENT NON-HEM/ONC PROBLEMS: 1. Ongoing tobacco and EtOH abuse 2. Paroxysmal A-fib 3. 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, p16+  -I reinforced some of the benefits and side effects of chemotherapy today, including nausea, vomiting, increased risk of infection, change in bowel habits, and loss of hair -PEG not yet scheduled, due to the ongoing COVID-19 outbreak and IR not performing feeding tube placement here; patient has been referred to [facility] for feeding tube placement, but no  date has been scheduled due to patient being evaluated for financial assistance there before he can undergo the procedure  -As he may not urgently require feeding tube within the first one to two weeks of treatment, we can proceed with the 1st dose of cisplatin on 11/10/2018 -I have asked nutrition to reach out to the patient regarding how to optimize his nutrition while awaiting feeding tube placement -In addition, I have discussed the case with the H&N navigator, who will reach out to [facility] and try to expedite his feeding tube placement there  -PRN anti-emetics: Zofran, Compazine, Ativan and dexamethasone   Recurrent DVT's of the RLE and remote hx of PTE in 1990's -On warfarin since 2000; patient denies any abnormal bleeding or bruising -DOAC's discussed, but patient prefers to continuing warfarin due to lower cost; however, his compliance has been mixed -He stopped his warfarin prior to his port placement, but did not resume the medication until this week; he has not contacted the warfarin clinic regarding INR monitoring or dose adjustment -I strongly encouraged the patient to reach out to the warfarin clinic for further management of his anticoagulation; patient agreed to the plan  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  -Hgb 18.0, stable -See discussion regarding EtOH and tobacco abuse below -We will monitor it for now  EtOH abuse -Patient reports reducing his alcohol use steadily over the past a few weeks; however, he is still drinking 3-4 oz of liquor or several beers every a few days -I counseled the patient again on the importance of alcohol moderate, and ideally, abstinence, as heavy EtOH use significantly increases the risk of adverse  effects from chemotherapy and also impairs the treatment efficacy -Furthermore, concurrent use of opioid medication (in anticipation of mucositis) and alcohol use is unsafe, and therefore he would need to reduce his EtOH even  further before he can be safely prescribed pain medications  -Patient expressed understanding and will continue to reduce his EtOH use as recommended   Tobacco abuse  -Patient reports essentially quitting cigarettes but is vaping  -I counseled the patient on the importance of abstinence from all tobacco products -He is in agreement, but feels that he is not smoking very much, as a cartridge lasts him for a week  No orders of the defined types were placed in this encounter.  All questions were answered. The patient knows to call the clinic with any problems, questions or concerns. No barriers to learning was detected.  A total of more than 40 minutes were spent face-to-face with the patient during this encounter and over half of that time was spent on counseling and coordination of care as outlined above.   Return in 1 week for labs, port flush and clinic appt prior to Cycle 2 of weekly cisplatin.   Tish Men, MD 11/09/2018 9:13 PM  CHIEF COMPLAINT: "I am doing okay so far"  INTERVAL HISTORY: Mr. Marietta returns to clinic for follow-up of squamous cell carcinoma on definitive chemoradiation. He reports that over the past a few weeks, he has continued to reduce his EtOH use, and is currently only drinking once every 3-4 days. However, when he does drink, he drinks 3-4 oz of vodka or several bottles of beer. He also reports that he has stopping smoking cigarettes, but started vaping. However, he feels that he is not vaping very much, as one cartridge is lasting him about a week. He started radiation on Tuesday this week, and so far has not had any side effect. He was seen at [facility] for possible feeding tube placement and submitted his information for financial aid, but has not heard any update. No date has been given for feeding tube placement yet. He denies any other complaint today.   SUMMARY OF ONCOLOGIC HISTORY:   Malignant neoplasm of tonsillar fossa ([place])   10/10/2018 Imaging    CT  neck w/ contrast: FINDINGS: Pharynx and larynx: There is asymmetric prominence of the left tonsillar region. No other mucosal or submucosal lesion is seen.  Salivary glands: Parotid and submandibular glands are normal.  Thyroid: Normal  Lymph nodes: Definite adenopathy on the left. 2 level nodes which shows some internal necrosis. The larger measures 2.2 cm in diameter and the smaller measures 1.4 cm in diameter. Suspicious level 4 node image 83 just anterior to the jugular vein measuring 9 mm short axis diameter. No worrisome nodes on the right.    10/10/2018 Imaging    CT chest:  IMPRESSION: 1. Mild right hilar adenopathy, equivocal for metastasis. No additional potential evidence of metastatic disease in the chest. PET-CT could be considered for further staging evaluation, as clinically warranted. 2. 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 Ascending thoracic aortic aneurysm. Recommend semi-annual imaging followup by CTA or MRA and referral to cardiothoracic surgery if not already obtained. This recommendation follows 2010 ACCF/AHA/AATS/ACR/ASA/SCA/SCAI/SIR/STS/SVM Guidelines for the Diagnosis and Management of Patients With Thoracic Aortic Disease. Circulation. 2010; 121: H702-O378. Aortic aneurysm NOS (ICD10-I71.9).    10/17/2018 Initial Diagnosis    Tonsil cancer ([place])    10/18/2018 Imaging    PET: IMPRESSION: 1. 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 2.  Local hypermetabolic nodal metastasis to the LEFT level II nodal station. 3. No contralateral nodal metastasis. 4. No evidence distant metastatic disease. 5. Two small pulmonary nodules in the LEFT lower lobe are favored benign. Recommend attention on follow-up. 6. Several small common iliac lymph nodes with low metabolic activity. Favor reactive adenopathy.    10/21/2018 Cancer Staging    Staging form: Pharynx - HPV-Mediated Oropharynx, AJCC 8th Edition - Clinical:  Stage I (cT2, cN1, cM0, p16+) - Signed by Eppie Gibson, MD on 10/21/2018    11/10/2018 -  Chemotherapy    The patient had palonosetron (ALOXI) injection 0.25 mg, 0.25 mg, Intravenous,  Once, 0 of 7 cycles CISplatin (PLATINOL) 100 mg in sodium chloride 0.9 % 500 mL chemo infusion, 40 mg/m2 = 100 mg, Intravenous,  Once, 0 of 7 cycles fosaprepitant (EMEND) 150 mg, dexamethasone (DECADRON) 12 mg in sodium chloride 0.9 % 145 mL IVPB, , Intravenous,  Once, 0 of 7 cycles  for chemotherapy treatment.      REVIEW OF SYSTEMS:   Constitutional: ( - ) fevers, ( - )  chills , ( - ) night sweats Eyes: ( - ) blurriness of vision, ( - ) double vision, ( - ) watery eyes Ears, nose, mouth, throat, and face: ( - ) mucositis, ( - ) sore throat Respiratory: ( - ) cough, ( - ) dyspnea, ( - ) wheezes Cardiovascular: ( - ) palpitation, ( - ) chest discomfort, ( - ) lower extremity swelling Gastrointestinal:  ( - ) nausea, ( - ) heartburn, ( - ) change in bowel habits Skin: ( - ) abnormal skin rashes Lymphatics: ( - ) new lymphadenopathy, ( - ) easy bruising Neurological: ( - ) numbness, ( - ) tingling, ( - ) new weaknesses Behavioral/Psych: ( - ) mood change, ( - ) new changes  All other systems were reviewed with the patient and are negative.  I have reviewed the past medical history, past surgical history, social history and family history with the patient and they are unchanged from previous note.  ALLERGIES:  has No Known Allergies.  MEDICATIONS:  Current Outpatient Medications  Medication Sig Dispense Refill  . amLODipine (NORVASC) 10 MG tablet TAKE 1 TABLET BY MOUTH EVERY DAY 90 tablet 2  . dexamethasone (DECADRON) 4 MG tablet Take 2 tablets by mouth once a day on the day after chemotherapy and then take 2 tablets two times a day for 2 days. Take with food. 30 tablet 1  . lidocaine-prilocaine (EMLA) cream Apply to affected area once 30 g 3  . LORazepam (ATIVAN) 0.5 MG tablet Take 1 tablet (0.5 mg total)  by mouth every 6 (six) hours as needed (Nausea or vomiting). 30 tablet 0  . ondansetron (ZOFRAN) 8 MG tablet Take 1 tablet (8 mg total) by mouth 2 (two) times daily as needed. Start on the third day after chemotherapy. 30 tablet 1  . oxyCODONE-acetaminophen (PERCOCET) 5-325 MG tablet Take 1 tablet by mouth every 4 (four) hours as needed for severe pain. 25 tablet 0  . prochlorperazine (COMPAZINE) 10 MG tablet Take 1 tablet (10 mg total) by mouth every 6 (six) hours as needed (Nausea or vomiting). 30 tablet 1  . sodium fluoride (PREVIDENT 5000 PLUS) 1.1 % CREA dental cream Apply to tooth brush. Brush teeth for 2 minutes. Spit out excess. DO NOT rinse afterwards. Repeat nightly. 1 Tube prn  . warfarin (COUMADIN) 10 MG tablet Take 10 mg by mouth daily at 6 PM.  No current facility-administered medications for this visit.     PHYSICAL EXAMINATION: ECOG PERFORMANCE STATUS: 2 - Symptomatic, <50% confined to bed  Today's Vitals   11/09/18 1529 11/09/18 1537  BP: (!) 134/100   Pulse: (!) 57   Resp: 17   Temp: (!) 97.4 F (36.3 C)   TempSrc: Oral   SpO2: 97%   Weight: 259 lb 14.4 oz (117.9 kg)   Height: 6\' 3"  (1.905 m)   PainSc:  2    Body mass index is 32.49 kg/m.  Filed Weights   11/09/18 1529  Weight: 259 lb 14.4 oz (117.9 kg)    GENERAL: alert, no distress and comfortable, mild facial erythema from secondary polycythemia  SKIN: skin color, texture, turgor are normal, no rashes or significant lesions EYES: conjunctiva are pink and non-injected, sclera clear OROPHARYNX: no exudate, no erythema; lips, buccal mucosa, and tongue normal  NECK: supple, non-tender LUNGS: clear to auscultation with normal breathing effort HEART: regular rate & rhythm and no murmurs and no lower extremity edema ABDOMEN: soft, non-tender, non-distended, normal bowel sounds Musculoskeletal: no cyanosis of digits and no clubbing  PSYCH: alert & oriented x 3, fluent speech NEURO: no focal motor/sensory  deficits  LABORATORY DATA:  I have reviewed the data as listed    Component Value Date/Time   NA 139 11/09/2018 1520   NA 142 10/03/2018 1450   NA 141 05/21/2014 1226   K 4.6 11/09/2018 1520   K 4.3 05/21/2014 1226   CL 101 11/09/2018 1520   CL 108 (H) 12/28/2012 1405   CO2 28 11/09/2018 1520   CO2 28 05/21/2014 1226   GLUCOSE 100 (H) 11/09/2018 1520   GLUCOSE 101 05/21/2014 1226   GLUCOSE 95 12/28/2012 1405   BUN 14 11/09/2018 1520   BUN 15 10/03/2018 1450   BUN 13.2 05/21/2014 1226   CREATININE 0.99 11/09/2018 1520   CREATININE 1.00 09/24/2014 1413   CREATININE 0.9 05/21/2014 1226   CALCIUM 9.6 11/09/2018 1520   CALCIUM 9.3 05/21/2014 1226   PROT 7.4 10/21/2018 1148   PROT 7.2 10/03/2018 1450   PROT 7.1 01/08/2014 1203   ALBUMIN 4.4 10/21/2018 1148   ALBUMIN 4.3 10/03/2018 1450   ALBUMIN 3.7 01/08/2014 1203   AST 20 10/21/2018 1148   AST 50 (H) 01/08/2014 1203   ALT 19 10/21/2018 1148   ALT 83 (H) 01/08/2014 1203   ALKPHOS 73 10/21/2018 1148   ALKPHOS 87 01/08/2014 1203   BILITOT 0.6 10/21/2018 1148   BILITOT 0.67 01/08/2014 1203   GFRNONAA >60 11/09/2018 1520   GFRNONAA >60 10/15/2011 0224   GFRAA >60 11/09/2018 1520   GFRAA >60 10/15/2011 0224    No results found for: SPEP, UPEP  Lab Results  Component Value Date   WBC 7.1 11/09/2018   NEUTROABS 4.9 11/09/2018   HGB 18.0 (H) 11/09/2018   HCT 53.2 (H) 11/09/2018   MCV 95.5 11/09/2018   PLT 192 11/09/2018      Chemistry      Component Value Date/Time   NA 139 11/09/2018 1520   NA 142 10/03/2018 1450   NA 141 05/21/2014 1226   K 4.6 11/09/2018 1520   K 4.3 05/21/2014 1226   CL 101 11/09/2018 1520   CL 108 (H) 12/28/2012 1405   CO2 28 11/09/2018 1520   CO2 28 05/21/2014 1226   BUN 14 11/09/2018 1520   BUN 15 10/03/2018 1450   BUN 13.2 05/21/2014 1226   CREATININE 0.99 11/09/2018 1520  CREATININE 1.00 09/24/2014 1413   CREATININE 0.9 05/21/2014 1226      Component Value Date/Time   CALCIUM  9.6 11/09/2018 1520   CALCIUM 9.3 05/21/2014 1226   ALKPHOS 73 10/21/2018 1148   ALKPHOS 87 01/08/2014 1203   AST 20 10/21/2018 1148   AST 50 (H) 01/08/2014 1203   ALT 19 10/21/2018 1148   ALT 83 (H) 01/08/2014 1203   BILITOT 0.6 10/21/2018 1148   BILITOT 0.67 01/08/2014 1203       RADIOGRAPHIC STUDIES: I have personally reviewed the radiological images as listed below and agreed with the findings in the report. Nm Pet Image Initial (pi) Skull Base To Thigh  Result Date: 10/18/2018 CLINICAL DATA:  Subsequent treatment strategy for LEFT neck lymphadenopathy EXAM: NUCLEAR MEDICINE PET SKULL BASE TO THIGH TECHNIQUE: 13.94 mCi F-18 FDG was injected intravenously. Full-ring PET imaging was performed from the skull base to thigh after the radiotracer. CT data was obtained and used for attenuation correction and anatomic localization. Fasting blood glucose: 118 mg/dl COMPARISON:  Neck CT 10/10/2018 FINDINGS: Mediastinal blood pool activity: SUV max 2.5 NECK: Elongated hypermetabolic thickening of the LEFT lingual tonsil with SUV max equal 14.3. Two adjacent hypermetabolic LEFT level II lymph nodes beneath the sternocleidomastoid muscle. The larger anterior node measures 18 mm with SUV max equal 10.6. Smaller posterior node measures 11 mm with SUV max equal 9.3. No contralateral hypermetabolic cervical lymph nodes. Incidental CT findings: none CHEST: No hypermetabolic mediastinal or hilar nodes. No suspicious pulmonary nodules on the CT scan. Incidental CT findings: 6 mm nodule in the LEFT lower lobe (image 46/8). Small adjacent LEFT lung base nodule measuring 5 mm (image 43/8). No associated metabolic activity the small lymph nodes pulmonary nodules. ABDOMEN/PELVIS: No no upper abdominal adenopathy. Normal liver pancreas spleen and kidneys. Small LEFT distal common iliac lymph node measuring 9 mm (image 190/4) has mild metabolic activity (SUV max equal 2.5) which is equal to background adjacent vascular  activity (SUV max equal 2.5). Similar small lymph node in the RIGHT common iliac station (image 186/4. Prostate normal Incidental CT findings: none SKELETON: No focal hypermetabolic activity to suggest skeletal metastasis. Incidental CT findings: none IMPRESSION: 1. 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 2. Local hypermetabolic nodal metastasis to the LEFT level II nodal station. 3. No contralateral nodal metastasis. 4. No evidence distant metastatic disease. 5. Two small pulmonary nodules in the LEFT lower lobe are favored benign. Recommend attention on follow-up. 6. Several small common iliac lymph nodes with low metabolic activity. Favor reactive adenopathy. Electronically Signed   By: Suzy Bouchard M.D.   On: 10/18/2018 15:48   Ir Imaging Guided Port Insertion  Result Date: 11/04/2018 INDICATION: 58 year old male with malignant squamous cell carcinoma of the left tonsillar fossa. He presents for port catheter placement to facilitate chemotherapy. EXAM: IMPLANTED PORT A CATH PLACEMENT WITH ULTRASOUND AND FLUOROSCOPIC GUIDANCE MEDICATIONS: 2 g Ancef; The antibiotic was administered within an appropriate time interval prior to skin puncture. ANESTHESIA/SEDATION: Versed 5 mg IV; Fentanyl 100 mcg IV; Moderate Sedation Time:  26 minutes The patient was continuously monitored during the procedure by the interventional radiology nurse under my direct supervision. FLUOROSCOPY TIME:  0 minutes, 48 seconds (38 mGy) COMPLICATIONS: None immediate. PROCEDURE: The right neck and chest was prepped with chlorhexidine, and draped in the usual sterile fashion using maximum barrier technique (cap and mask, sterile gown, sterile gloves, large sterile sheet, hand hygiene and cutaneous antiseptic). Local anesthesia was attained by infiltration  with 1% lidocaine with epinephrine. Ultrasound demonstrated patency of the right internal jugular vein, and this was documented with an image.  Under real-time ultrasound guidance, this vein was accessed with a 21 gauge micropuncture needle and image documentation was performed. A small dermatotomy was made at the access site with an 11 scalpel. A 0.018" wire was advanced into the SVC and the access needle exchanged for a 35F micropuncture vascular sheath. The 0.018" wire was then removed and a 0.035" wire advanced into the IVC. An appropriate location for the subcutaneous reservoir was selected below the clavicle and an incision was made through the skin and underlying soft tissues. The subcutaneous tissues were then dissected using a combination of blunt and sharp surgical technique and a pocket was formed. A single lumen power injectable portacatheter was then tunneled through the subcutaneous tissues from the pocket to the dermatotomy and the port reservoir placed within the subcutaneous pocket. The venous access site was then serially dilated and a peel away vascular sheath placed over the wire. The wire was removed and the port catheter advanced into position under fluoroscopic guidance. The catheter tip is positioned in the superior cavoatrial junction. This was documented with a spot image. The portacatheter was then tested and found to flush and aspirate well. The port was flushed with saline followed by 100 units/mL heparinized saline. The pocket was then closed in two layers using first subdermal inverted interrupted absorbable sutures followed by a running subcuticular suture. The epidermis was then sealed with Dermabond. The dermatotomy at the venous access site was also closed with Dermabond. IMPRESSION: Successful placement of a right IJ approach Power Port with ultrasound and fluoroscopic guidance. The catheter is ready for use. Electronically Signed   By: Jacqulynn Cadet M.D.   On: 11/04/2018 16:00

## 2018-11-09 NOTE — Progress Notes (Signed)

## 2018-11-10 ENCOUNTER — Inpatient Hospital Stay: Payer: Medicaid Other

## 2018-11-10 ENCOUNTER — Ambulatory Visit
Admission: RE | Admit: 2018-11-10 | Discharge: 2018-11-10 | Disposition: A | Discharge status: Home / Self Care | Payer: Medicaid Other | Source: Ambulatory Visit | Attending: Radiation Oncology | Admitting: Radiation Oncology

## 2018-11-10 ENCOUNTER — Other Ambulatory Visit: Payer: Self-pay

## 2018-11-10 ENCOUNTER — Telehealth: Payer: Self-pay | Admitting: Hematology

## 2018-11-10 VITALS — BP 147/95 | HR 60 | Temp 97.5°F | Resp 19

## 2018-11-10 DIAGNOSIS — Z87891 Personal history of nicotine dependence: Secondary | ICD-10-CM | POA: Diagnosis not present

## 2018-11-10 DIAGNOSIS — C090 Malignant neoplasm of tonsillar fossa: Secondary | ICD-10-CM | POA: Diagnosis not present

## 2018-11-10 DIAGNOSIS — Z86718 Personal history of other venous thrombosis and embolism: Secondary | ICD-10-CM | POA: Diagnosis not present

## 2018-11-10 DIAGNOSIS — Z510 Encounter for antineoplastic radiation therapy: Secondary | ICD-10-CM | POA: Diagnosis not present

## 2018-11-10 DIAGNOSIS — D6859 Other primary thrombophilia: Secondary | ICD-10-CM | POA: Diagnosis not present

## 2018-11-10 DIAGNOSIS — Z7901 Long term (current) use of anticoagulants: Secondary | ICD-10-CM | POA: Diagnosis not present

## 2018-11-10 DIAGNOSIS — I10 Essential (primary) hypertension: Secondary | ICD-10-CM | POA: Diagnosis not present

## 2018-11-10 DIAGNOSIS — Z5111 Encounter for antineoplastic chemotherapy: Secondary | ICD-10-CM | POA: Diagnosis not present

## 2018-11-10 DIAGNOSIS — Z79899 Other long term (current) drug therapy: Secondary | ICD-10-CM | POA: Diagnosis not present

## 2018-11-10 DIAGNOSIS — Z86711 Personal history of pulmonary embolism: Secondary | ICD-10-CM | POA: Diagnosis not present

## 2018-11-10 DIAGNOSIS — C770 Secondary and unspecified malignant neoplasm of lymph nodes of head, face and neck: Secondary | ICD-10-CM | POA: Diagnosis not present

## 2018-11-10 MED ORDER — HEPARIN SOD (PORK) LOCK FLUSH 100 UNIT/ML IV SOLN
500.0000 [IU] | Freq: Once | INTRAVENOUS | Status: AC | PRN
  Administered 2018-11-10: 500 [IU]
  Filled 2018-11-10: qty 5

## 2018-11-10 MED ORDER — PALONOSETRON HCL INJECTION 0.25 MG/5ML
INTRAVENOUS | Status: AC
  Filled 2018-11-10: qty 5

## 2018-11-10 MED ORDER — SODIUM CHLORIDE 0.9 % IV SOLN
40.0000 mg/m2 | Freq: Once | INTRAVENOUS | Status: AC
  Administered 2018-11-10: 100 mg via INTRAVENOUS
  Filled 2018-11-10: qty 100

## 2018-11-10 MED ORDER — SODIUM CHLORIDE 0.9 % IV SOLN
Freq: Once | INTRAVENOUS | Status: AC
  Administered 2018-11-10: 08:00:00 via INTRAVENOUS
  Filled 2018-11-10: qty 250

## 2018-11-10 MED ORDER — POTASSIUM CHLORIDE 2 MEQ/ML IV SOLN
Freq: Once | INTRAVENOUS | Status: AC
  Administered 2018-11-10: 08:00:00 via INTRAVENOUS
  Filled 2018-11-10: qty 10

## 2018-11-10 MED ORDER — PALONOSETRON HCL INJECTION 0.25 MG/5ML
0.2500 mg | Freq: Once | INTRAVENOUS | Status: AC
  Administered 2018-11-10: 0.25 mg via INTRAVENOUS

## 2018-11-10 MED ORDER — SODIUM CHLORIDE 0.9 % IV SOLN
Freq: Once | INTRAVENOUS | Status: AC
  Administered 2018-11-10: 11:00:00 via INTRAVENOUS
  Filled 2018-11-10: qty 5

## 2018-11-10 MED ORDER — SODIUM CHLORIDE 0.9% FLUSH
10.0000 mL | INTRAVENOUS | Status: DC | PRN
  Administered 2018-11-10: 10 mL
  Filled 2018-11-10: qty 10

## 2018-11-10 NOTE — Patient Instructions (Signed)
Coronavirus (COVID-19) Are you at risk?  Are you at risk for the Coronavirus (COVID-19)?  To be considered HIGH RISK for Coronavirus (COVID-19), you have to meet the following criteria:  . Traveled to [place], [place], [place], [place] or [place]; or in the [place] to [place], [place], [place], or [place]; and have fever, cough, and shortness of breath within the last 2 weeks of travel OR . Been in close contact with a person diagnosed with COVID-19 within the last 2 weeks and have fever, cough, and shortness of breath . IF YOU DO NOT MEET THESE CRITERIA, YOU ARE CONSIDERED LOW RISK FOR COVID-19.  What to do if you are HIGH RISK for COVID-19?  . If you are having a medical emergency, call 911. . Seek medical care right away. Before you go to a doctor's office, urgent care or emergency department, call ahead and tell them about your recent travel, contact with someone diagnosed with COVID-19, and your symptoms. You should receive instructions from your physician's office regarding next steps of care.  . When you arrive at healthcare provider, tell the healthcare staff immediately you have returned from visiting [place], [place], [place], [place] or [place]; or traveled in the [place] to [place], [place], [place], or [place]; in the last two weeks or you have been in close contact with a person diagnosed with COVID-19 in the last 2 weeks.   . Tell the health care staff about your symptoms: fever, cough and shortness of breath. . After you have been seen by a medical provider, you will be either: o Tested for (COVID-19) and discharged home on quarantine except to seek medical care if symptoms worsen, and asked to  - Stay home and avoid contact with others until you get your results (4-5 days)  - Avoid travel on public transportation if possible (such as bus, train, or airplane) or o Sent to the Emergency Department by EMS for evaluation, COVID-19 testing, and possible  admission depending on your condition and test results.  What to do if you are LOW RISK for COVID-19?  Reduce your risk of any infection by using the same precautions used for avoiding the common cold or flu:  . Wash your hands often with soap and warm water for at least 20 seconds.  If soap and water are not readily available, use an alcohol-based hand sanitizer with at least 60% alcohol.  . If coughing or sneezing, cover your mouth and nose by coughing or sneezing into the elbow areas of your shirt or coat, into a tissue or into your sleeve (not your hands). . Avoid shaking hands with others and consider head nods or verbal greetings only. . Avoid touching your eyes, nose, or mouth with unwashed hands.  . Avoid close contact with people who are sick. . Avoid places or events with large numbers of people in one location, like concerts or sporting events. . Carefully consider travel plans you have or are making. . If you are planning any travel outside or inside the [place], visit the CDC's Travelers' Health webpage for the latest health notices. . If you have some symptoms but not all symptoms, continue to monitor at home and seek medical attention if your symptoms worsen. . If you are having a medical emergency, call 911.   ADDITIONAL HEALTHCARE OPTIONS FOR PATIENTS  [facility] / e-Visit: [email or web address]         [facility]: [phone]  On[facility]   Urgent Care: [facility]: [place] Discharge Instructions for Patients Receiving Chemotherapy  Today you received the following chemotherapy agents: Cisplatin (Platinol)  To help prevent nausea and vomiting after your treatment, we encourage you to take your nausea medication as directed. Received Aloxi during treatment today-->Take your Compazine or Ativan prescription (not your Zofran) for the  next 3 days as needed.    If you develop nausea and vomiting that is not controlled by your nausea medication, call the clinic.   BELOW ARE SYMPTOMS THAT SHOULD BE REPORTED IMMEDIATELY:  *FEVER GREATER THAN 100.5 F  *CHILLS WITH OR WITHOUT FEVER  NAUSEA AND VOMITING THAT IS NOT CONTROLLED WITH YOUR NAUSEA MEDICATION  *UNUSUAL SHORTNESS OF BREATH  *UNUSUAL BRUISING OR BLEEDING  TENDERNESS IN MOUTH AND THROAT WITH OR WITHOUT PRESENCE OF ULCERS  *URINARY PROBLEMS  *BOWEL PROBLEMS  UNUSUAL RASH Items with * indicate a potential emergency and should be followed up as soon as possible.  Feel free to call the clinic should you have any questions or concerns. The clinic phone number is [phone].  Please show the [facility] at check-in to the Emergency Department and triage nurse.  Cisplatin injection What is this medicine? CISPLATIN (SIS pla tin) is a chemotherapy drug. It targets fast dividing cells, like cancer cells, and causes these cells to die. This medicine is used to treat many types of cancer like bladder, ovarian, and testicular cancers. This medicine may be used for other purposes; ask your health care provider or pharmacist if you have questions. COMMON BRAND NAME(S): Platinol, Platinol -AQ What should I tell my health care provider before I take this medicine? They need to know if you have any of these conditions: -blood disorders -hearing problems -kidney disease -recent or ongoing radiation therapy -an unusual or allergic reaction to cisplatin, carboplatin, other chemotherapy, other medicines, foods, dyes, or preservatives -pregnant or trying to get pregnant -breast-feeding How should I use this medicine? This drug is given as an infusion into a vein. It is administered in a hospital or clinic by a specially trained health care professional. Talk to your pediatrician regarding the use of this medicine in children. Special care may be  needed. Overdosage: If you think you have taken too much of this medicine contact a poison control center or emergency room at once. NOTE: This medicine is only for you. Do not share this medicine with others. What if I miss a dose? It is important not to miss a dose. Call your doctor or health care professional if you are unable to keep an appointment. What may interact with this medicine? -dofetilide -foscarnet -medicines for seizures -medicines to increase blood counts like filgrastim, pegfilgrastim, sargramostim -probenecid -pyridoxine used with altretamine -rituximab -some antibiotics like amikacin, gentamicin, neomycin, polymyxin B, streptomycin, tobramycin -sulfinpyrazone -vaccines -zalcitabine Talk to your doctor or health care professional before taking any of these medicines: -acetaminophen -aspirin -ibuprofen -ketoprofen -naproxen This list may not describe all possible interactions. Give your health care provider a list of all the medicines, herbs, non-prescription drugs, or dietary supplements you use. Also tell them if you smoke, drink alcohol, or use illegal drugs. Some items may interact with your medicine. What should I watch for while using this medicine? Your condition will be monitored carefully while you are receiving this medicine. You will need important blood work  done while you are taking this medicine. This drug may make you feel generally unwell. This is not uncommon, as chemotherapy can affect healthy cells as well as cancer cells. Report any side effects. Continue your course of treatment even though you feel ill unless your doctor tells you to stop. In some cases, you may be given additional medicines to help with side effects. Follow all directions for their use. Call your doctor or health care professional for advice if you get a fever, chills or sore throat, or other symptoms of a cold or flu. Do not treat yourself. This drug decreases your body's ability  to fight infections. Try to avoid being around people who are sick. This medicine may increase your risk to bruise or bleed. Call your doctor or health care professional if you notice any unusual bleeding. Be careful brushing and flossing your teeth or using a toothpick because you may get an infection or bleed more easily. If you have any dental work done, tell your dentist you are receiving this medicine. Avoid taking products that contain aspirin, acetaminophen, ibuprofen, naproxen, or ketoprofen unless instructed by your doctor. These medicines may hide a fever. Do not become pregnant while taking this medicine. Women should inform their doctor if they wish to become pregnant or think they might be pregnant. There is a potential for serious side effects to an unborn child. Talk to your health care professional or pharmacist for more information. Do not breast-feed an infant while taking this medicine. Drink fluids as directed while you are taking this medicine. This will help protect your kidneys. Call your doctor or health care professional if you get diarrhea. Do not treat yourself. What side effects may I notice from receiving this medicine? Side effects that you should report to your doctor or health care professional as soon as possible: -allergic reactions like skin rash, itching or hives, swelling of the face, lips, or tongue -signs of infection - fever or chills, cough, sore throat, pain or difficulty passing urine -signs of decreased platelets or bleeding - bruising, pinpoint red spots on the skin, black, tarry stools, nosebleeds -signs of decreased red blood cells - unusually weak or tired, fainting spells, lightheadedness -breathing problems -changes in hearing -gout pain -low blood counts - This drug may decrease the number of white blood cells, red blood cells and platelets. You may be at increased risk for infections and bleeding. -nausea and vomiting -pain, swelling, redness or  irritation at the injection site -pain, tingling, numbness in the hands or feet -problems with balance, movement -trouble passing urine or change in the amount of urine Side effects that usually do not require medical attention (report to your doctor or health care professional if they continue or are bothersome): -changes in vision -loss of appetite -metallic taste in the mouth or changes in taste This list may not describe all possible side effects. Call your doctor for medical advice about side effects. You may report side effects to FDA at 1-800-FDA-1088. Where should I keep my medicine? This drug is given in a hospital or clinic and will not be stored at home. NOTE: This sheet is a summary. It may not cover all possible information. If you have questions about this medicine, talk to your doctor, pharmacist, or health care provider.  2019 [employer] Standard (2007-10-11 14:40:54)

## 2018-11-10 NOTE — Telephone Encounter (Signed)
Scheduled appt per 4/22 los. °

## 2018-11-11 ENCOUNTER — Other Ambulatory Visit: Payer: Self-pay

## 2018-11-11 ENCOUNTER — Ambulatory Visit
Admission: RE | Admit: 2018-11-11 | Discharge: 2018-11-11 | Disposition: A | Discharge status: Home / Self Care | Payer: Medicaid Other | Source: Ambulatory Visit | Attending: Radiation Oncology | Admitting: Radiation Oncology

## 2018-11-11 ENCOUNTER — Telehealth: Payer: Self-pay

## 2018-11-11 DIAGNOSIS — C090 Malignant neoplasm of tonsillar fossa: Secondary | ICD-10-CM | POA: Diagnosis not present

## 2018-11-11 DIAGNOSIS — Z7901 Long term (current) use of anticoagulants: Secondary | ICD-10-CM | POA: Diagnosis not present

## 2018-11-11 DIAGNOSIS — C770 Secondary and unspecified malignant neoplasm of lymph nodes of head, face and neck: Secondary | ICD-10-CM | POA: Diagnosis not present

## 2018-11-11 DIAGNOSIS — Z86718 Personal history of other venous thrombosis and embolism: Secondary | ICD-10-CM | POA: Diagnosis not present

## 2018-11-11 DIAGNOSIS — Z510 Encounter for antineoplastic radiation therapy: Secondary | ICD-10-CM | POA: Diagnosis not present

## 2018-11-11 DIAGNOSIS — D6859 Other primary thrombophilia: Secondary | ICD-10-CM | POA: Diagnosis not present

## 2018-11-11 DIAGNOSIS — Z79899 Other long term (current) drug therapy: Secondary | ICD-10-CM | POA: Diagnosis not present

## 2018-11-11 DIAGNOSIS — I10 Essential (primary) hypertension: Secondary | ICD-10-CM | POA: Diagnosis not present

## 2018-11-11 DIAGNOSIS — Z86711 Personal history of pulmonary embolism: Secondary | ICD-10-CM | POA: Diagnosis not present

## 2018-11-11 DIAGNOSIS — Z87891 Personal history of nicotine dependence: Secondary | ICD-10-CM | POA: Diagnosis not present

## 2018-11-11 NOTE — Telephone Encounter (Signed)
Contacted patient for first time chemotherapy follow up. Left message with purpose of call and to call back if he has any questions or concerns.

## 2018-11-11 NOTE — Telephone Encounter (Signed)
-----   Message from Zola Button, RN sent at 11/10/2018  3:30 PM EDT ----- Regarding: Dr, Maylon Peppers; First time F/U Call Pt received first time Cisplatin on 11/10/18. Tolerated well. Thank you!

## 2018-11-13 ENCOUNTER — Encounter: Payer: Self-pay | Admitting: General Surgery

## 2018-11-14 ENCOUNTER — Ambulatory Visit
Admission: RE | Admit: 2018-11-14 | Discharge: 2018-11-14 | Disposition: A | Discharge status: Home / Self Care | Payer: Medicaid Other | Source: Ambulatory Visit | Attending: Radiation Oncology | Admitting: Radiation Oncology

## 2018-11-14 ENCOUNTER — Telehealth: Payer: Self-pay | Admitting: *Deleted

## 2018-11-14 ENCOUNTER — Other Ambulatory Visit: Payer: Self-pay | Admitting: Radiation Oncology

## 2018-11-14 ENCOUNTER — Other Ambulatory Visit: Payer: Self-pay | Admitting: General Surgery

## 2018-11-14 ENCOUNTER — Other Ambulatory Visit: Payer: Self-pay

## 2018-11-14 DIAGNOSIS — I10 Essential (primary) hypertension: Secondary | ICD-10-CM | POA: Diagnosis not present

## 2018-11-14 DIAGNOSIS — C099 Malignant neoplasm of tonsil, unspecified: Secondary | ICD-10-CM | POA: Diagnosis not present

## 2018-11-14 DIAGNOSIS — C090 Malignant neoplasm of tonsillar fossa: Secondary | ICD-10-CM | POA: Diagnosis not present

## 2018-11-14 DIAGNOSIS — Z510 Encounter for antineoplastic radiation therapy: Secondary | ICD-10-CM | POA: Diagnosis not present

## 2018-11-14 DIAGNOSIS — Z7901 Long term (current) use of anticoagulants: Secondary | ICD-10-CM | POA: Diagnosis not present

## 2018-11-14 DIAGNOSIS — Z79899 Other long term (current) drug therapy: Secondary | ICD-10-CM | POA: Diagnosis not present

## 2018-11-14 DIAGNOSIS — D6859 Other primary thrombophilia: Secondary | ICD-10-CM | POA: Diagnosis not present

## 2018-11-14 DIAGNOSIS — Z87891 Personal history of nicotine dependence: Secondary | ICD-10-CM | POA: Diagnosis not present

## 2018-11-14 DIAGNOSIS — Z86718 Personal history of other venous thrombosis and embolism: Secondary | ICD-10-CM | POA: Diagnosis not present

## 2018-11-14 DIAGNOSIS — Z86711 Personal history of pulmonary embolism: Secondary | ICD-10-CM | POA: Diagnosis not present

## 2018-11-14 DIAGNOSIS — C770 Secondary and unspecified malignant neoplasm of lymph nodes of head, face and neck: Secondary | ICD-10-CM | POA: Diagnosis not present

## 2018-11-14 MED ORDER — LIDOCAINE VISCOUS HCL 2 % MT SOLN: OROMUCOSAL | 5 refills | Status: DC

## 2018-11-14 MED ORDER — SUCRALFATE 1 G PO TABS: ORAL_TABLET | ORAL | 5 refills | Status: DC

## 2018-11-14 NOTE — Telephone Encounter (Signed)
Oncology Nurse Navigator Documentation  Spoke with Mr. [name] [name] in follow-up to today's [facility] surgical consult for PEG placement.    He indicated he did not schedule date/time b/c he wanted additional guidance from Dr. Isidore Moos when he saw her for weekly PUT but he forgot to discuss.  I re-educated him on the oral intake challenges he will have, importance of having PEG for maintaining optional nutritional/hydration intake during chemo/RT, criteria for removal s/p tmt.    He noted he took his daily dose of coumadin today so likely he will not be able to schedule procedure until early next week d/t 96-hour hold requirement.  He voice understanding of information, agreed to call Dr. Pollie Friar office tomorrow to schedule.    Gayleen Orem, RN, BSN Head & Neck Oncology Nurse [facility] [place] [phone]

## 2018-11-15 ENCOUNTER — Other Ambulatory Visit: Payer: Self-pay

## 2018-11-15 ENCOUNTER — Ambulatory Visit
Admission: RE | Admit: 2018-11-15 | Discharge: 2018-11-15 | Disposition: A | Discharge status: Home / Self Care | Payer: Medicaid Other | Source: Ambulatory Visit | Attending: Radiation Oncology | Admitting: Radiation Oncology

## 2018-11-15 DIAGNOSIS — C770 Secondary and unspecified malignant neoplasm of lymph nodes of head, face and neck: Secondary | ICD-10-CM | POA: Diagnosis not present

## 2018-11-15 DIAGNOSIS — Z7901 Long term (current) use of anticoagulants: Secondary | ICD-10-CM | POA: Diagnosis not present

## 2018-11-15 DIAGNOSIS — Z87891 Personal history of nicotine dependence: Secondary | ICD-10-CM | POA: Diagnosis not present

## 2018-11-15 DIAGNOSIS — Z79899 Other long term (current) drug therapy: Secondary | ICD-10-CM | POA: Diagnosis not present

## 2018-11-15 DIAGNOSIS — D6859 Other primary thrombophilia: Secondary | ICD-10-CM | POA: Diagnosis not present

## 2018-11-15 DIAGNOSIS — Z510 Encounter for antineoplastic radiation therapy: Secondary | ICD-10-CM | POA: Diagnosis not present

## 2018-11-15 DIAGNOSIS — C090 Malignant neoplasm of tonsillar fossa: Secondary | ICD-10-CM | POA: Diagnosis not present

## 2018-11-15 DIAGNOSIS — Z86711 Personal history of pulmonary embolism: Secondary | ICD-10-CM | POA: Diagnosis not present

## 2018-11-15 DIAGNOSIS — Z86718 Personal history of other venous thrombosis and embolism: Secondary | ICD-10-CM | POA: Diagnosis not present

## 2018-11-15 DIAGNOSIS — I10 Essential (primary) hypertension: Secondary | ICD-10-CM | POA: Diagnosis not present

## 2018-11-16 ENCOUNTER — Inpatient Hospital Stay (HOSPITAL_BASED_OUTPATIENT_CLINIC_OR_DEPARTMENT_OTHER): Payer: Medicaid Other | Admitting: Hematology

## 2018-11-16 ENCOUNTER — Encounter: Payer: Self-pay | Admitting: Hematology

## 2018-11-16 ENCOUNTER — Inpatient Hospital Stay: Payer: Medicaid Other

## 2018-11-16 ENCOUNTER — Other Ambulatory Visit: Payer: Self-pay

## 2018-11-16 ENCOUNTER — Ambulatory Visit
Admission: RE | Admit: 2018-11-16 | Discharge: 2018-11-16 | Disposition: A | Discharge status: Home / Self Care | Payer: Medicaid Other | Source: Ambulatory Visit | Attending: Radiation Oncology | Admitting: Radiation Oncology

## 2018-11-16 ENCOUNTER — Telehealth: Payer: Self-pay | Admitting: Hematology

## 2018-11-16 VITALS — BP 109/84 | HR 58 | Temp 97.6°F | Resp 18 | Ht 75.0 in | Wt 253.4 lb

## 2018-11-16 DIAGNOSIS — G893 Neoplasm related pain (acute) (chronic): Secondary | ICD-10-CM | POA: Diagnosis not present

## 2018-11-16 DIAGNOSIS — C090 Malignant neoplasm of tonsillar fossa: Secondary | ICD-10-CM

## 2018-11-16 DIAGNOSIS — D6859 Other primary thrombophilia: Secondary | ICD-10-CM | POA: Diagnosis not present

## 2018-11-16 DIAGNOSIS — Z86711 Personal history of pulmonary embolism: Secondary | ICD-10-CM

## 2018-11-16 DIAGNOSIS — Z7901 Long term (current) use of anticoagulants: Secondary | ICD-10-CM | POA: Diagnosis not present

## 2018-11-16 DIAGNOSIS — I82401 Acute embolism and thrombosis of unspecified deep veins of right lower extremity: Secondary | ICD-10-CM

## 2018-11-16 DIAGNOSIS — C770 Secondary and unspecified malignant neoplasm of lymph nodes of head, face and neck: Secondary | ICD-10-CM | POA: Diagnosis not present

## 2018-11-16 DIAGNOSIS — D751 Secondary polycythemia: Secondary | ICD-10-CM | POA: Diagnosis not present

## 2018-11-16 DIAGNOSIS — Z86718 Personal history of other venous thrombosis and embolism: Secondary | ICD-10-CM | POA: Diagnosis not present

## 2018-11-16 DIAGNOSIS — Z720 Tobacco use: Secondary | ICD-10-CM

## 2018-11-16 DIAGNOSIS — I10 Essential (primary) hypertension: Secondary | ICD-10-CM | POA: Diagnosis not present

## 2018-11-16 DIAGNOSIS — I825Y1 Chronic embolism and thrombosis of unspecified deep veins of right proximal lower extremity: Secondary | ICD-10-CM

## 2018-11-16 DIAGNOSIS — Z87891 Personal history of nicotine dependence: Secondary | ICD-10-CM | POA: Diagnosis not present

## 2018-11-16 DIAGNOSIS — Z79899 Other long term (current) drug therapy: Secondary | ICD-10-CM | POA: Diagnosis not present

## 2018-11-16 DIAGNOSIS — F1010 Alcohol abuse, uncomplicated: Secondary | ICD-10-CM

## 2018-11-16 DIAGNOSIS — E876 Hypokalemia: Secondary | ICD-10-CM

## 2018-11-16 DIAGNOSIS — Z510 Encounter for antineoplastic radiation therapy: Secondary | ICD-10-CM | POA: Diagnosis not present

## 2018-11-16 DIAGNOSIS — Z5111 Encounter for antineoplastic chemotherapy: Secondary | ICD-10-CM | POA: Diagnosis not present

## 2018-11-16 LAB — CBC WITH DIFFERENTIAL (CANCER CENTER ONLY)
Abs Immature Granulocytes: 0.07 10*3/uL (ref 0.00–0.07)
Basophils Absolute: 0 10*3/uL (ref 0.0–0.1)
Basophils Relative: 0 %
Eosinophils Absolute: 0 10*3/uL (ref 0.0–0.5)
Eosinophils Relative: 0 %
HCT: 52.1 % — ABNORMAL HIGH (ref 39.0–52.0)
Hemoglobin: 18.2 g/dL — ABNORMAL HIGH (ref 13.0–17.0)
Immature Granulocytes: 1 %
Lymphocytes Relative: 5 %
Lymphs Abs: 0.5 10*3/uL — ABNORMAL LOW (ref 0.7–4.0)
MCH: 32.3 pg (ref 26.0–34.0)
MCHC: 34.9 g/dL (ref 30.0–36.0)
MCV: 92.4 fL (ref 80.0–100.0)
Monocytes Absolute: 0.5 10*3/uL (ref 0.1–1.0)
Monocytes Relative: 6 %
Neutro Abs: 8.5 10*3/uL — ABNORMAL HIGH (ref 1.7–7.7)
Neutrophils Relative %: 88 %
Platelet Count: 192 10*3/uL (ref 150–400)
RBC: 5.64 MIL/uL (ref 4.22–5.81)
RDW: 12.4 % (ref 11.5–15.5)
WBC Count: 9.6 10*3/uL (ref 4.0–10.5)
nRBC: 0 % (ref 0.0–0.2)

## 2018-11-16 LAB — BASIC METABOLIC PANEL - CANCER CENTER ONLY
Anion gap: 11 (ref 5–15)
BUN: 27 mg/dL — ABNORMAL HIGH (ref 6–20)
CO2: 31 mmol/L (ref 22–32)
Calcium: 9 mg/dL (ref 8.9–10.3)
Chloride: 92 mmol/L — ABNORMAL LOW (ref 98–111)
Creatinine: 1.04 mg/dL (ref 0.61–1.24)
GFR, Est AFR Am: >60 mL/min (ref >60)
GFR, Estimated: >60 mL/min (ref >60)
Glucose, Bld: 106 mg/dL — ABNORMAL HIGH (ref 70–99)
Potassium: 3.3 mmol/L — ABNORMAL LOW (ref 3.5–5.1)
Sodium: 134 mmol/L — ABNORMAL LOW (ref 135–145)

## 2018-11-16 LAB — MAGNESIUM: Magnesium: 1.8 mg/dL (ref 1.7–2.4)

## 2018-11-16 MED ORDER — MAGNESIUM OXIDE 400 (241.3 MG) MG PO TABS: 400.0000 mg | ORAL_TABLET | Freq: Two times a day (BID) | ORAL | 3 refills | Status: AC

## 2018-11-16 MED ORDER — POTASSIUM CHLORIDE CRYS ER 20 MEQ PO TBCR: 20.0000 meq | EXTENDED_RELEASE_TABLET | Freq: Every day | ORAL | 3 refills | Status: DC

## 2018-11-16 NOTE — Progress Notes (Signed)
[facility] OFFICE PROGRESS NOTE  Patient Care Team: Patient, No Pcp Per as PCP - General (General Practice) Annia Belt, MD as Consulting Physician (Oncology)  HEME/ONC OVERVIEW: 1. 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 Melene Plan, invasive SCCa, p16+; not a candidate for TORS per ENT  -Late 10/2018 - present: definitive chemoradiation with weekly cisplatin   2. 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 Port placed in 10/2018; PEG not yet scheduled   TREATMENT REGIMEN:  Early 2000 - present: warfarin, questionable compliance   11/08/2018 - present: definitive chemoradiation with weekly cisplatin   PERTINENT NON-HEM/ONC PROBLEMS: 1. Tobacco and EtOH abuse 2. Paroxysmal A-fib 3. 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  Based on my discussion with Dr. Beryle Beams, patient would be able to continue follow-up with the coumadin clinic, but the patient had been told that he can no longer be monitored by the Coumadin clinic since Dr. Azucena Freed retirement. 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 -Hgb 18.2 today, stable -See discussion regarding EtOH and tobacco abuse below -We will monitor for now  Hypokalemia -Secondary to electrolyte wasting from chemotherapy -K 3.3 today, patient is asymptomatic -I have prescribed KCl 20 mEq daily  Cancer-related pain -Primarily localized to the tonsil malignancy  -Patient is currently taking 2-3 tabs of ibuprofen at night -I counseled the patient on the risk of nephrotoxicity from concurrent use of NSAIDs and cisplatin, and cautioned him in taking only 2 tabs of ibuprofen as needed  EtOH abuse -Patient reports that he has only drunk 1 time over the past week, which is significantly less prior to starting chemoradiation (up to a gallon a week) -I congratulated patient on his progress, and encouraged him to continue to reduce his alcohol use with a goal of EtOH cessation -Patient expressed understanding, and agreed with the plan  Tobacco abuse  -Patient reports that he has quit smoking cigarettes and is reducing the frequency of vaping -He is using  nicotine gums to help with craving -I congratulated patient on the progress, and encouraged him to continue reducing the usage of nicotine products  No orders of the defined types were placed in this  encounter.  All questions were answered. The patient knows to call the clinic with any problems, questions or concerns. No barriers to learning was detected.  Return in 1 week for port flush, labs, and clinic appointment prior to Cycle 3 of weekly cisplatin.  [name] Men, MD 11/16/2018 3:51 PM  CHIEF COMPLAINT: "I am doing pretty good so far "  INTERVAL HISTORY: [name] [name] returns to clinic for follow-up of squamous cell carcinoma of the tonsil on definitive chemoradiation.  Patient reports that he has tolerated first cycle of weekly cisplatin concurrent with chemotherapy relatively well.  He has some soreness in the back of the throat, but the intensity of the pain is relatively mild, and he takes 2 to 3 tablets of ibuprofen at night to help with the pain.  He still has some Percocets from the recent tonsillectomy surgery, but the pain has not been severe enough to require opioid pain medication.  He is continue to reduce his alcohol and tobacco use.  He has only had drunk 1 time over the past week.  He is using nicotine gums to help with his craving, and he has only vaped occasionally.  He has not resumed any cigarette use.  He denies any other complaint today.  SUMMARY OF ONCOLOGIC HISTORY:   Malignant neoplasm of tonsillar fossa ([place])   10/10/2018 Imaging    CT neck w/ contrast: FINDINGS: Pharynx and larynx: There is asymmetric prominence of the left tonsillar region. No other mucosal or submucosal lesion is seen.  Salivary glands: Parotid and submandibular glands are normal.  Thyroid: Normal  Lymph nodes: Definite adenopathy on the left. 2 level nodes which shows some internal necrosis. The larger measures 2.2 cm in diameter and the smaller measures 1.4 cm in diameter. Suspicious level 4 node image 83 just anterior to the jugular vein measuring 9 mm short axis diameter. No worrisome nodes on the right.    10/10/2018 Imaging    CT chest:  IMPRESSION: 1. Mild right hilar  adenopathy, equivocal for metastasis. No additional potential evidence of metastatic disease in the chest. PET-CT could be considered for further staging evaluation, as clinically warranted. 2. 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 Ascending thoracic aortic aneurysm. Recommend semi-annual imaging followup by CTA or MRA and referral to cardiothoracic surgery if not already obtained. This recommendation follows 2010 ACCF/AHA/AATS/ACR/ASA/SCA/SCAI/SIR/STS/SVM Guidelines for the Diagnosis and Management of Patients With Thoracic Aortic Disease. Circulation. 2010; 121: Z858-I502. Aortic aneurysm NOS (ICD10-I71.9).    10/17/2018 Initial Diagnosis    Tonsil cancer ([place])    10/18/2018 Imaging    PET: IMPRESSION: 1. 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 2. Local hypermetabolic nodal metastasis to the LEFT level II nodal station. 3. No contralateral nodal metastasis. 4. No evidence distant metastatic disease. 5. Two small pulmonary nodules in the LEFT lower lobe are favored benign. Recommend attention on follow-up. 6. Several small common iliac lymph nodes with low metabolic activity. Favor reactive adenopathy.    10/21/2018 Cancer Staging    Staging form: Pharynx - HPV-Mediated Oropharynx, AJCC 8th Edition - Clinical: Stage I (cT2, cN1, cM0, p16+) - Signed by Eppie Gibson, MD on 10/21/2018    11/10/2018 -  Chemotherapy    The patient had palonosetron (ALOXI) injection 0.25 mg, 0.25 mg, Intravenous,  Once, 1  of 7 cycles Administration: 0.25 mg (11/10/2018) CISplatin (PLATINOL) 100 mg in sodium chloride 0.9 % 500 mL chemo infusion, 40 mg/m2 = 100 mg, Intravenous,  Once, 1 of 7 cycles Administration: 100 mg (11/10/2018) fosaprepitant (EMEND) 150 mg, dexamethasone (DECADRON) 12 mg in sodium chloride 0.9 % 145 mL IVPB, , Intravenous,  Once, 1 of 7 cycles Administration:  (11/10/2018)  for chemotherapy treatment.      REVIEW OF SYSTEMS:    Constitutional: ( - ) fevers, ( - )  chills , ( - ) night sweats Eyes: ( - ) blurriness of vision, ( - ) double vision, ( - ) watery eyes Ears, nose, mouth, throat, and face: ( - ) mucositis, ( + ) sore throat Respiratory: ( - ) cough, ( - ) dyspnea, ( - ) wheezes Cardiovascular: ( - ) palpitation, ( - ) chest discomfort, ( - ) lower extremity swelling Gastrointestinal:  ( - ) nausea, ( - ) heartburn, ( - ) change in bowel habits Skin: ( - ) abnormal skin rashes Lymphatics: ( - ) new lymphadenopathy, ( - ) easy bruising Neurological: ( - ) numbness, ( - ) tingling, ( - ) new weaknesses Behavioral/Psych: ( - ) mood change, ( - ) new changes  All other systems were reviewed with the patient and are negative.  I have reviewed the past medical history, past surgical history, social history and family history with the patient and they are unchanged from previous note.  ALLERGIES:  has No Known Allergies.  MEDICATIONS:  Current Outpatient Medications  Medication Sig Dispense Refill  . amLODipine (NORVASC) 10 MG tablet TAKE 1 TABLET BY MOUTH EVERY DAY 90 tablet 2  . dexamethasone (DECADRON) 4 MG tablet Take 2 tablets by mouth once a day on the day after chemotherapy and then take 2 tablets two times a day for 2 days. Take with food. 30 tablet 1  . lidocaine (XYLOCAINE) 2 % solution Patient: Mix 1part 2% viscous lidocaine, 1part H20. Swish & swallow 20mL of diluted mixture, 18min before meals and at bedtime, up to QID 100 mL 5  . lidocaine-prilocaine (EMLA) cream Apply to affected area once 30 g 3  . LORazepam (ATIVAN) 0.5 MG tablet Take 1 tablet (0.5 mg total) by mouth every 6 (six) hours as needed (Nausea or vomiting). 30 tablet 0  . magnesium oxide (MAG-OX) 400 (241.3 Mg) MG tablet Take 1 tablet (400 mg total) by mouth 2 (two) times daily for 30 days. 60 tablet 3  . ondansetron (ZOFRAN) 8 MG tablet Take 1 tablet (8 mg total) by mouth 2 (two) times daily as needed. Start on the third day after  chemotherapy. 30 tablet 1  . oxyCODONE-acetaminophen (PERCOCET) 5-325 MG tablet Take 1 tablet by mouth every 4 (four) hours as needed for severe pain. 25 tablet 0  . potassium chloride SA (K-DUR) 20 MEQ tablet Take 1 tablet (20 mEq total) by mouth daily for 30 days. 30 tablet 3  . prochlorperazine (COMPAZINE) 10 MG tablet Take 1 tablet (10 mg total) by mouth every 6 (six) hours as needed (Nausea or vomiting). 30 tablet 1  . sodium fluoride (PREVIDENT 5000 PLUS) 1.1 % CREA dental cream Apply to tooth brush. Brush teeth for 2 minutes. Spit out excess. DO NOT rinse afterwards. Repeat nightly. 1 Tube prn  . sucralfate (CARAFATE) 1 g tablet Dissolve 1 tablet in 10 mL H20 and swallow prn soreness, up to QID. 40 tablet 5  . warfarin (COUMADIN) 10 MG tablet Take  10 mg by mouth daily at 6 PM.     No current facility-administered medications for this visit.     PHYSICAL EXAMINATION: ECOG PERFORMANCE STATUS: 1 - Symptomatic but completely ambulatory  Today's Vitals   11/16/18 1509 11/16/18 1524  BP: 109/84   Pulse: (!) 58   Resp: 18   Temp: 97.6 F (36.4 C)   TempSrc: Oral   SpO2: 100%   Weight: 253 lb 6.4 oz (114.9 kg)   Height: 6\' 3"  (1.905 m)   PainSc:  2    Body mass index is 31.67 kg/m.  Filed Weights   11/16/18 1509  Weight: 253 lb 6.4 oz (114.9 kg)    GENERAL: alert, no distress and comfortable SKIN: some facial erythema from secondary polycythemia, no apparent skin rash  EYES: conjunctiva are pink and non-injected, sclera clear OROPHARYNX: no exudate, no erythema; lips, buccal mucosa, and tongue normal  NECK: supple, non-tender LUNGS: clear to auscultation with normal breathing effort HEART: regular rate & rhythm and no murmurs and no lower extremity edema ABDOMEN: soft, non-tender, non-distended, normal bowel sounds Musculoskeletal: no cyanosis of digits and no clubbing  PSYCH: alert & oriented x 3, fluent speech NEURO: no focal motor/sensory deficits  LABORATORY DATA:  I  have reviewed the data as listed    Component Value Date/Time   NA 134 (L) 11/16/2018 1450   NA 142 10/03/2018 1450   NA 141 05/21/2014 1226   K 3.3 (L) 11/16/2018 1450   K 4.3 05/21/2014 1226   CL 92 (L) 11/16/2018 1450   CL 108 (H) 12/28/2012 1405   CO2 31 11/16/2018 1450   CO2 28 05/21/2014 1226   GLUCOSE 106 (H) 11/16/2018 1450   GLUCOSE 101 05/21/2014 1226   GLUCOSE 95 12/28/2012 1405   BUN 27 (H) 11/16/2018 1450   BUN 15 10/03/2018 1450   BUN 13.2 05/21/2014 1226   CREATININE 1.04 11/16/2018 1450   CREATININE 1.00 09/24/2014 1413   CREATININE 0.9 05/21/2014 1226   CALCIUM 9.0 11/16/2018 1450   CALCIUM 9.3 05/21/2014 1226   PROT 7.4 10/21/2018 1148   PROT 7.2 10/03/2018 1450   PROT 7.1 01/08/2014 1203   ALBUMIN 4.4 10/21/2018 1148   ALBUMIN 4.3 10/03/2018 1450   ALBUMIN 3.7 01/08/2014 1203   AST 20 10/21/2018 1148   AST 50 (H) 01/08/2014 1203   ALT 19 10/21/2018 1148   ALT 83 (H) 01/08/2014 1203   ALKPHOS 73 10/21/2018 1148   ALKPHOS 87 01/08/2014 1203   BILITOT 0.6 10/21/2018 1148   BILITOT 0.67 01/08/2014 1203   GFRNONAA >60 11/16/2018 1450   GFRNONAA >60 10/15/2011 0224   GFRAA >60 11/16/2018 1450   GFRAA >60 10/15/2011 0224    No results found for: SPEP, UPEP  Lab Results  Component Value Date   WBC 9.6 11/16/2018   NEUTROABS 8.5 (H) 11/16/2018   HGB 18.2 (H) 11/16/2018   HCT 52.1 (H) 11/16/2018   MCV 92.4 11/16/2018   PLT 192 11/16/2018      Chemistry      Component Value Date/Time   NA 134 (L) 11/16/2018 1450   NA 142 10/03/2018 1450   NA 141 05/21/2014 1226   K 3.3 (L) 11/16/2018 1450   K 4.3 05/21/2014 1226   CL 92 (L) 11/16/2018 1450   CL 108 (H) 12/28/2012 1405   CO2 31 11/16/2018 1450   CO2 28 05/21/2014 1226   BUN 27 (H) 11/16/2018 1450   BUN 15 10/03/2018 1450   BUN  13.2 05/21/2014 1226   CREATININE 1.04 11/16/2018 1450   CREATININE 1.00 09/24/2014 1413   CREATININE 0.9 05/21/2014 1226      Component Value Date/Time    CALCIUM 9.0 11/16/2018 1450   CALCIUM 9.3 05/21/2014 1226   ALKPHOS 73 10/21/2018 1148   ALKPHOS 87 01/08/2014 1203   AST 20 10/21/2018 1148   AST 50 (H) 01/08/2014 1203   ALT 19 10/21/2018 1148   ALT 83 (H) 01/08/2014 1203   BILITOT 0.6 10/21/2018 1148   BILITOT 0.67 01/08/2014 1203       RADIOGRAPHIC STUDIES: I have personally reviewed the radiological images as listed below and agreed with the findings in the report. Nm Pet Image Initial (pi) Skull Base To Thigh  Result Date: 10/18/2018 CLINICAL DATA:  Subsequent treatment strategy for LEFT neck lymphadenopathy EXAM: NUCLEAR MEDICINE PET SKULL BASE TO THIGH TECHNIQUE: 13.94 mCi F-18 FDG was injected intravenously. Full-ring PET imaging was performed from the skull base to thigh after the radiotracer. CT data was obtained and used for attenuation correction and anatomic localization. Fasting blood glucose: 118 mg/dl COMPARISON:  Neck CT 10/10/2018 FINDINGS: Mediastinal blood pool activity: SUV max 2.5 NECK: Elongated hypermetabolic thickening of the LEFT lingual tonsil with SUV max equal 14.3. Two adjacent hypermetabolic LEFT level II lymph nodes beneath the sternocleidomastoid muscle. The larger anterior node measures 18 mm with SUV max equal 10.6. Smaller posterior node measures 11 mm with SUV max equal 9.3. No contralateral hypermetabolic cervical lymph nodes. Incidental CT findings: none CHEST: No hypermetabolic mediastinal or hilar nodes. No suspicious pulmonary nodules on the CT scan. Incidental CT findings: 6 mm nodule in the LEFT lower lobe (image 46/8). Small adjacent LEFT lung base nodule measuring 5 mm (image 43/8). No associated metabolic activity the small lymph nodes pulmonary nodules. ABDOMEN/PELVIS: No no upper abdominal adenopathy. Normal liver pancreas spleen and kidneys. Small LEFT distal common iliac lymph node measuring 9 mm (image 190/4) has mild metabolic activity (SUV max equal 2.5) which is equal to background adjacent  vascular activity (SUV max equal 2.5). Similar small lymph node in the RIGHT common iliac station (image 186/4. Prostate normal Incidental CT findings: none SKELETON: No focal hypermetabolic activity to suggest skeletal metastasis. Incidental CT findings: none IMPRESSION: 1. 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 2. Local hypermetabolic nodal metastasis to the LEFT level II nodal station. 3. No contralateral nodal metastasis. 4. No evidence distant metastatic disease. 5. Two small pulmonary nodules in the LEFT lower lobe are favored benign. Recommend attention on follow-up. 6. Several small common iliac lymph nodes with low metabolic activity. Favor reactive adenopathy. Electronically Signed   By: Suzy Bouchard M.D.   On: 10/18/2018 15:48   Ir Imaging Guided Port Insertion  Result Date: 11/04/2018 INDICATION: 58 year old male with malignant squamous cell carcinoma of the left tonsillar fossa. He presents for port catheter placement to facilitate chemotherapy. EXAM: IMPLANTED PORT A CATH PLACEMENT WITH ULTRASOUND AND FLUOROSCOPIC GUIDANCE MEDICATIONS: 2 g Ancef; The antibiotic was administered within an appropriate time interval prior to skin puncture. ANESTHESIA/SEDATION: Versed 5 mg IV; Fentanyl 100 mcg IV; Moderate Sedation Time:  26 minutes The patient was continuously monitored during the procedure by the interventional radiology nurse under my direct supervision. FLUOROSCOPY TIME:  0 minutes, 48 seconds (38 mGy) COMPLICATIONS: None immediate. PROCEDURE: The right neck and chest was prepped with chlorhexidine, and draped in the usual sterile fashion using maximum barrier technique (cap and mask, sterile gown, sterile gloves, large sterile sheet,  hand hygiene and cutaneous antiseptic). Local anesthesia was attained by infiltration with 1% lidocaine with epinephrine. Ultrasound demonstrated patency of the right internal jugular vein, and this was documented with an  image. Under real-time ultrasound guidance, this vein was accessed with a 21 gauge micropuncture needle and image documentation was performed. A small dermatotomy was made at the access site with an 11 scalpel. A 0.018" wire was advanced into the SVC and the access needle exchanged for a 17F micropuncture vascular sheath. The 0.018" wire was then removed and a 0.035" wire advanced into the IVC. An appropriate location for the subcutaneous reservoir was selected below the clavicle and an incision was made through the skin and underlying soft tissues. The subcutaneous tissues were then dissected using a combination of blunt and sharp surgical technique and a pocket was formed. A single lumen power injectable portacatheter was then tunneled through the subcutaneous tissues from the pocket to the dermatotomy and the port reservoir placed within the subcutaneous pocket. The venous access site was then serially dilated and a peel away vascular sheath placed over the wire. The wire was removed and the port catheter advanced into position under fluoroscopic guidance. The catheter tip is positioned in the superior cavoatrial junction. This was documented with a spot image. The portacatheter was then tested and found to flush and aspirate well. The port was flushed with saline followed by 100 units/mL heparinized saline. The pocket was then closed in two layers using first subdermal inverted interrupted absorbable sutures followed by a running subcuticular suture. The epidermis was then sealed with Dermabond. The dermatotomy at the venous access site was also closed with Dermabond. IMPRESSION: Successful placement of a right IJ approach Power Port with ultrasound and fluoroscopic guidance. The catheter is ready for use. Electronically Signed   By: Jacqulynn Cadet M.D.   On: 11/04/2018 16:00

## 2018-11-16 NOTE — Telephone Encounter (Signed)
Per 4/29 los, no change in appts.

## 2018-11-17 ENCOUNTER — Inpatient Hospital Stay: Payer: Medicaid Other

## 2018-11-17 ENCOUNTER — Ambulatory Visit
Admission: RE | Admit: 2018-11-17 | Discharge: 2018-11-17 | Disposition: A | Discharge status: Home / Self Care | Payer: Medicaid Other | Source: Ambulatory Visit | Attending: Radiation Oncology | Admitting: Radiation Oncology

## 2018-11-17 ENCOUNTER — Encounter: Payer: Self-pay | Admitting: Hematology

## 2018-11-17 ENCOUNTER — Other Ambulatory Visit: Payer: Self-pay

## 2018-11-17 VITALS — BP 109/80 | HR 78 | Temp 97.4°F | Resp 16

## 2018-11-17 DIAGNOSIS — Z510 Encounter for antineoplastic radiation therapy: Secondary | ICD-10-CM | POA: Diagnosis not present

## 2018-11-17 DIAGNOSIS — Z5111 Encounter for antineoplastic chemotherapy: Secondary | ICD-10-CM | POA: Diagnosis not present

## 2018-11-17 DIAGNOSIS — Z86718 Personal history of other venous thrombosis and embolism: Secondary | ICD-10-CM | POA: Diagnosis not present

## 2018-11-17 DIAGNOSIS — I10 Essential (primary) hypertension: Secondary | ICD-10-CM | POA: Diagnosis not present

## 2018-11-17 DIAGNOSIS — C090 Malignant neoplasm of tonsillar fossa: Secondary | ICD-10-CM | POA: Diagnosis not present

## 2018-11-17 DIAGNOSIS — Z7901 Long term (current) use of anticoagulants: Secondary | ICD-10-CM | POA: Diagnosis not present

## 2018-11-17 DIAGNOSIS — Z86711 Personal history of pulmonary embolism: Secondary | ICD-10-CM | POA: Diagnosis not present

## 2018-11-17 DIAGNOSIS — Z79899 Other long term (current) drug therapy: Secondary | ICD-10-CM | POA: Diagnosis not present

## 2018-11-17 DIAGNOSIS — C770 Secondary and unspecified malignant neoplasm of lymph nodes of head, face and neck: Secondary | ICD-10-CM | POA: Diagnosis not present

## 2018-11-17 DIAGNOSIS — Z87891 Personal history of nicotine dependence: Secondary | ICD-10-CM | POA: Diagnosis not present

## 2018-11-17 DIAGNOSIS — D6859 Other primary thrombophilia: Secondary | ICD-10-CM | POA: Diagnosis not present

## 2018-11-17 MED ORDER — SODIUM CHLORIDE 0.9 % IV SOLN
40.0000 mg/m2 | Freq: Once | INTRAVENOUS | Status: AC
  Administered 2018-11-17: 11:00:00 100 mg via INTRAVENOUS
  Filled 2018-11-17: qty 100

## 2018-11-17 MED ORDER — SODIUM CHLORIDE 0.9 % IV SOLN
Freq: Once | INTRAVENOUS | Status: AC
  Administered 2018-11-17: 08:00:00 via INTRAVENOUS
  Filled 2018-11-17: qty 250

## 2018-11-17 MED ORDER — POTASSIUM CHLORIDE 2 MEQ/ML IV SOLN
Freq: Once | INTRAVENOUS | Status: AC
  Administered 2018-11-17: 08:00:00 via INTRAVENOUS
  Filled 2018-11-17: qty 10

## 2018-11-17 MED ORDER — SODIUM CHLORIDE 0.9 % IV SOLN
Freq: Once | INTRAVENOUS | Status: AC
  Administered 2018-11-17: 10:00:00 via INTRAVENOUS
  Filled 2018-11-17: qty 5

## 2018-11-17 MED ORDER — SODIUM CHLORIDE 0.9% FLUSH
10.0000 mL | INTRAVENOUS | Status: DC | PRN
  Administered 2018-11-17: 10 mL
  Filled 2018-11-17: qty 10

## 2018-11-17 MED ORDER — PALONOSETRON HCL INJECTION 0.25 MG/5ML
INTRAVENOUS | Status: AC
  Filled 2018-11-17: qty 5

## 2018-11-17 MED ORDER — PALONOSETRON HCL INJECTION 0.25 MG/5ML
0.2500 mg | Freq: Once | INTRAVENOUS | Status: AC
  Administered 2018-11-17: 0.25 mg via INTRAVENOUS

## 2018-11-17 MED ORDER — HEPARIN SOD (PORK) LOCK FLUSH 100 UNIT/ML IV SOLN
500.0000 [IU] | Freq: Once | INTRAVENOUS | Status: AC | PRN
  Administered 2018-11-17: 14:00:00 500 [IU]
  Filled 2018-11-17: qty 5

## 2018-11-17 NOTE — Patient Instructions (Signed)
[facility] Discharge Instructions for Patients Receiving Chemotherapy  Today you received the following chemotherapy agents Cisplatin  To help prevent nausea and vomiting after your treatment, we encourage you to take your nausea medication as directed  If you develop nausea and vomiting that is not controlled by your nausea medication, call the clinic.   BELOW ARE SYMPTOMS THAT SHOULD BE REPORTED IMMEDIATELY:  *FEVER GREATER THAN 100.5 F  *CHILLS WITH OR WITHOUT FEVER  NAUSEA AND VOMITING THAT IS NOT CONTROLLED WITH YOUR NAUSEA MEDICATION  *UNUSUAL SHORTNESS OF BREATH  *UNUSUAL BRUISING OR BLEEDING  TENDERNESS IN MOUTH AND THROAT WITH OR WITHOUT PRESENCE OF ULCERS  *URINARY PROBLEMS  *BOWEL PROBLEMS  UNUSUAL RASH Items with * indicate a potential emergency and should be followed up as soon as possible.  Feel free to call the clinic should you have any questions or concerns. The clinic phone number is [phone].  Please show the CHEMO ALERT CARD at check-in to the Emergency Department and triage nurse.   

## 2018-11-17 NOTE — Progress Notes (Signed)
Met w/ pt to introduce myself as his Arboriculturist.  I gave him a Medicaid application and a financial application to apply for a discount thru the hospital.  I also informed him of the [place] and gave him an expense sheet.  If he would like to apply he will provide a letter of support since he's not receiving income at this time.  He has my card for any questions or concerns he may have in the future.

## 2018-11-18 ENCOUNTER — Other Ambulatory Visit: Payer: Self-pay

## 2018-11-18 ENCOUNTER — Ambulatory Visit
Admission: RE | Admit: 2018-11-18 | Discharge: 2018-11-18 | Disposition: A | Discharge status: Home / Self Care | Payer: Medicaid Other | Source: Ambulatory Visit | Attending: Radiation Oncology | Admitting: Radiation Oncology

## 2018-11-18 DIAGNOSIS — Z510 Encounter for antineoplastic radiation therapy: Secondary | ICD-10-CM | POA: Diagnosis not present

## 2018-11-18 DIAGNOSIS — D6859 Other primary thrombophilia: Secondary | ICD-10-CM | POA: Diagnosis not present

## 2018-11-18 DIAGNOSIS — Z7901 Long term (current) use of anticoagulants: Secondary | ICD-10-CM | POA: Diagnosis not present

## 2018-11-18 DIAGNOSIS — Z86711 Personal history of pulmonary embolism: Secondary | ICD-10-CM | POA: Diagnosis not present

## 2018-11-18 DIAGNOSIS — Z79899 Other long term (current) drug therapy: Secondary | ICD-10-CM | POA: Insufficient documentation

## 2018-11-18 DIAGNOSIS — Z86718 Personal history of other venous thrombosis and embolism: Secondary | ICD-10-CM | POA: Insufficient documentation

## 2018-11-18 DIAGNOSIS — C770 Secondary and unspecified malignant neoplasm of lymph nodes of head, face and neck: Secondary | ICD-10-CM | POA: Insufficient documentation

## 2018-11-18 DIAGNOSIS — C090 Malignant neoplasm of tonsillar fossa: Secondary | ICD-10-CM | POA: Diagnosis not present

## 2018-11-18 DIAGNOSIS — Z87891 Personal history of nicotine dependence: Secondary | ICD-10-CM | POA: Insufficient documentation

## 2018-11-18 DIAGNOSIS — I10 Essential (primary) hypertension: Secondary | ICD-10-CM | POA: Insufficient documentation

## 2018-11-21 ENCOUNTER — Other Ambulatory Visit: Payer: Self-pay

## 2018-11-21 ENCOUNTER — Other Ambulatory Visit: Payer: Self-pay | Admitting: Radiation Oncology

## 2018-11-21 ENCOUNTER — Ambulatory Visit
Admission: RE | Admit: 2018-11-21 | Discharge: 2018-11-21 | Disposition: A | Discharge status: Home / Self Care | Payer: Medicaid Other | Source: Ambulatory Visit | Attending: Radiation Oncology | Admitting: Radiation Oncology

## 2018-11-21 DIAGNOSIS — Z86711 Personal history of pulmonary embolism: Secondary | ICD-10-CM | POA: Diagnosis not present

## 2018-11-21 DIAGNOSIS — I10 Essential (primary) hypertension: Secondary | ICD-10-CM | POA: Diagnosis not present

## 2018-11-21 DIAGNOSIS — Z510 Encounter for antineoplastic radiation therapy: Secondary | ICD-10-CM | POA: Diagnosis not present

## 2018-11-21 DIAGNOSIS — Z87891 Personal history of nicotine dependence: Secondary | ICD-10-CM | POA: Diagnosis not present

## 2018-11-21 DIAGNOSIS — C770 Secondary and unspecified malignant neoplasm of lymph nodes of head, face and neck: Secondary | ICD-10-CM | POA: Diagnosis not present

## 2018-11-21 DIAGNOSIS — Z86718 Personal history of other venous thrombosis and embolism: Secondary | ICD-10-CM | POA: Diagnosis not present

## 2018-11-21 DIAGNOSIS — D6859 Other primary thrombophilia: Secondary | ICD-10-CM | POA: Diagnosis not present

## 2018-11-21 DIAGNOSIS — C090 Malignant neoplasm of tonsillar fossa: Secondary | ICD-10-CM | POA: Diagnosis not present

## 2018-11-21 DIAGNOSIS — Z79899 Other long term (current) drug therapy: Secondary | ICD-10-CM | POA: Diagnosis not present

## 2018-11-21 DIAGNOSIS — Z7901 Long term (current) use of anticoagulants: Secondary | ICD-10-CM | POA: Diagnosis not present

## 2018-11-22 ENCOUNTER — Telehealth: Payer: Self-pay | Admitting: Hematology

## 2018-11-22 ENCOUNTER — Ambulatory Visit
Admission: RE | Admit: 2018-11-22 | Discharge: 2018-11-22 | Disposition: A | Discharge status: Home / Self Care | Payer: Medicaid Other | Source: Ambulatory Visit | Attending: Radiation Oncology | Admitting: Radiation Oncology

## 2018-11-22 ENCOUNTER — Other Ambulatory Visit: Payer: Self-pay

## 2018-11-22 DIAGNOSIS — I10 Essential (primary) hypertension: Secondary | ICD-10-CM | POA: Diagnosis not present

## 2018-11-22 DIAGNOSIS — Z86711 Personal history of pulmonary embolism: Secondary | ICD-10-CM | POA: Diagnosis not present

## 2018-11-22 DIAGNOSIS — C770 Secondary and unspecified malignant neoplasm of lymph nodes of head, face and neck: Secondary | ICD-10-CM | POA: Diagnosis not present

## 2018-11-22 DIAGNOSIS — Z87891 Personal history of nicotine dependence: Secondary | ICD-10-CM | POA: Diagnosis not present

## 2018-11-22 DIAGNOSIS — Z7901 Long term (current) use of anticoagulants: Secondary | ICD-10-CM | POA: Diagnosis not present

## 2018-11-22 DIAGNOSIS — Z86718 Personal history of other venous thrombosis and embolism: Secondary | ICD-10-CM | POA: Diagnosis not present

## 2018-11-22 DIAGNOSIS — Z79899 Other long term (current) drug therapy: Secondary | ICD-10-CM | POA: Diagnosis not present

## 2018-11-22 DIAGNOSIS — D6859 Other primary thrombophilia: Secondary | ICD-10-CM | POA: Diagnosis not present

## 2018-11-22 DIAGNOSIS — Z510 Encounter for antineoplastic radiation therapy: Secondary | ICD-10-CM | POA: Diagnosis not present

## 2018-11-22 DIAGNOSIS — C090 Malignant neoplasm of tonsillar fossa: Secondary | ICD-10-CM | POA: Diagnosis not present

## 2018-11-22 NOTE — Telephone Encounter (Signed)
Patient called back to confirm appt time and date.  He stated the appt time is okay and if something comes up before then, he will give me a call back.

## 2018-11-22 NOTE — Telephone Encounter (Signed)
Called patient to inform patient that their appt time has been moved up per MD request. Left a voice message of that scheduled appt and my number for the patient to call me back if they have any questions.

## 2018-11-23 ENCOUNTER — Other Ambulatory Visit: Payer: Self-pay

## 2018-11-23 ENCOUNTER — Inpatient Hospital Stay: Payer: Medicaid Other

## 2018-11-23 ENCOUNTER — Ambulatory Visit
Admission: RE | Admit: 2018-11-23 | Discharge: 2018-11-23 | Disposition: A | Discharge status: Home / Self Care | Payer: Medicaid Other | Source: Ambulatory Visit | Attending: Radiation Oncology | Admitting: Radiation Oncology

## 2018-11-23 ENCOUNTER — Ambulatory Visit: Payer: Self-pay | Admitting: Hematology

## 2018-11-23 ENCOUNTER — Encounter: Payer: Self-pay | Admitting: Hematology

## 2018-11-23 ENCOUNTER — Inpatient Hospital Stay (HOSPITAL_BASED_OUTPATIENT_CLINIC_OR_DEPARTMENT_OTHER): Payer: Medicaid Other | Admitting: Hematology

## 2018-11-23 ENCOUNTER — Inpatient Hospital Stay: Payer: Medicaid Other | Attending: Hematology | Admitting: Nutrition

## 2018-11-23 ENCOUNTER — Telehealth: Payer: Self-pay | Admitting: Hematology

## 2018-11-23 ENCOUNTER — Other Ambulatory Visit: Payer: Self-pay | Admitting: Hematology

## 2018-11-23 DIAGNOSIS — D751 Secondary polycythemia: Secondary | ICD-10-CM | POA: Diagnosis not present

## 2018-11-23 DIAGNOSIS — I825Y1 Chronic embolism and thrombosis of unspecified deep veins of right proximal lower extremity: Secondary | ICD-10-CM | POA: Diagnosis not present

## 2018-11-23 DIAGNOSIS — R21 Rash and other nonspecific skin eruption: Secondary | ICD-10-CM

## 2018-11-23 DIAGNOSIS — F1010 Alcohol abuse, uncomplicated: Secondary | ICD-10-CM | POA: Diagnosis not present

## 2018-11-23 DIAGNOSIS — C090 Malignant neoplasm of tonsillar fossa: Secondary | ICD-10-CM

## 2018-11-23 DIAGNOSIS — Z86718 Personal history of other venous thrombosis and embolism: Secondary | ICD-10-CM | POA: Diagnosis not present

## 2018-11-23 DIAGNOSIS — I2609 Other pulmonary embolism with acute cor pulmonale: Secondary | ICD-10-CM

## 2018-11-23 DIAGNOSIS — D6859 Other primary thrombophilia: Secondary | ICD-10-CM | POA: Diagnosis not present

## 2018-11-23 DIAGNOSIS — I10 Essential (primary) hypertension: Secondary | ICD-10-CM | POA: Diagnosis not present

## 2018-11-23 DIAGNOSIS — Z87891 Personal history of nicotine dependence: Secondary | ICD-10-CM | POA: Diagnosis not present

## 2018-11-23 DIAGNOSIS — F17210 Nicotine dependence, cigarettes, uncomplicated: Secondary | ICD-10-CM

## 2018-11-23 DIAGNOSIS — Z5111 Encounter for antineoplastic chemotherapy: Secondary | ICD-10-CM | POA: Diagnosis present

## 2018-11-23 DIAGNOSIS — I2782 Chronic pulmonary embolism: Secondary | ICD-10-CM

## 2018-11-23 DIAGNOSIS — C770 Secondary and unspecified malignant neoplasm of lymph nodes of head, face and neck: Secondary | ICD-10-CM | POA: Diagnosis not present

## 2018-11-23 DIAGNOSIS — Z79899 Other long term (current) drug therapy: Secondary | ICD-10-CM | POA: Diagnosis not present

## 2018-11-23 DIAGNOSIS — Z86711 Personal history of pulmonary embolism: Secondary | ICD-10-CM | POA: Diagnosis not present

## 2018-11-23 DIAGNOSIS — Z510 Encounter for antineoplastic radiation therapy: Secondary | ICD-10-CM | POA: Diagnosis not present

## 2018-11-23 DIAGNOSIS — Z7901 Long term (current) use of anticoagulants: Secondary | ICD-10-CM | POA: Diagnosis not present

## 2018-11-23 DIAGNOSIS — Z720 Tobacco use: Secondary | ICD-10-CM

## 2018-11-23 LAB — CBC WITH DIFFERENTIAL (CANCER CENTER ONLY)
Abs Immature Granulocytes: 0.06 10*3/uL (ref 0.00–0.07)
Basophils Absolute: 0 10*3/uL (ref 0.0–0.1)
Basophils Relative: 0 %
Eosinophils Absolute: 0 10*3/uL (ref 0.0–0.5)
Eosinophils Relative: 1 %
HCT: 48.3 % (ref 39.0–52.0)
Hemoglobin: 16.4 g/dL (ref 13.0–17.0)
Immature Granulocytes: 1 %
Lymphocytes Relative: 6 %
Lymphs Abs: 0.5 10*3/uL — ABNORMAL LOW (ref 0.7–4.0)
MCH: 32.2 pg (ref 26.0–34.0)
MCHC: 34 g/dL (ref 30.0–36.0)
MCV: 94.9 fL (ref 80.0–100.0)
Monocytes Absolute: 0.8 10*3/uL (ref 0.1–1.0)
Monocytes Relative: 9 %
Neutro Abs: 7.3 10*3/uL (ref 1.7–7.7)
Neutrophils Relative %: 83 %
Platelet Count: 115 10*3/uL — ABNORMAL LOW (ref 150–400)
RBC: 5.09 MIL/uL (ref 4.22–5.81)
RDW: 13.3 % (ref 11.5–15.5)
WBC Count: 8.7 10*3/uL (ref 4.0–10.5)
nRBC: 0 % (ref 0.0–0.2)

## 2018-11-23 LAB — BASIC METABOLIC PANEL - CANCER CENTER ONLY
Anion gap: 8 (ref 5–15)
BUN: 18 mg/dL (ref 6–20)
CO2: 32 mmol/L (ref 22–32)
Calcium: 8.3 mg/dL — ABNORMAL LOW (ref 8.9–10.3)
Chloride: 100 mmol/L (ref 98–111)
Creatinine: 0.84 mg/dL (ref 0.61–1.24)
GFR, Est AFR Am: >60 mL/min (ref >60)
GFR, Estimated: >60 mL/min (ref >60)
Glucose, Bld: 82 mg/dL (ref 70–99)
Potassium: 4.1 mmol/L (ref 3.5–5.1)
Sodium: 140 mmol/L (ref 135–145)

## 2018-11-23 LAB — PROTIME-INR
INR: 1.5 — ABNORMAL HIGH (ref 0.8–1.2)
Prothrombin Time: 18.2 seconds — ABNORMAL HIGH (ref 11.4–15.2)

## 2018-11-23 LAB — MAGNESIUM: Magnesium: 1.8 mg/dL (ref 1.7–2.4)

## 2018-11-23 NOTE — Telephone Encounter (Signed)
Per 5/6 los No change in appts.

## 2018-11-23 NOTE — Progress Notes (Signed)
RD working remotely.  58 year old male diagnosed with P 16+ tonsil cancer.  He is receiving chemoradiation with weekly cisplatin which began April 21. Patient does not have a PEG scheduled.  Past medical history includes hyperlipidemia, hypertension, tobacco, alcohol, and A. fib.  Medications include Decadron, Ativan, magnesium oxide, Zofran, Compazine, Carafate, and Coumadin.  Labs include sodium 134, potassium 3.3, glucose 106, and BUN 27.  Height: 6 feet 3 inches. Weight: 253.4 pounds on April 29. Patient weighed 265 pounds October 03, 2018. BMI: 31.67.  Patient reports that he is currently not having difficulty chewing or swallowing.   He denies nausea and vomiting.   Reports decreased taste after chemotherapy. Patient does not consume nutrition shakes. He reports his weight is currently stable.  Estimated nutrition needs: 2400-2600 cal, 120-140 g protein, 2.6 L fluid.  Nutrition diagnosis:  Predicted suboptimal energy intake related to new diagnosis of tonsil cancer as evidenced by a condition for which research shows an increased incidence of suboptimal energy intake.  Intervention: Educated patient to consume small frequent meals and snacks with adequate calories and protein. Encouraged weight maintenance. Recommended patient consume Carnation breakfast essentials mixed with milk which he enjoys. I will email fax sheets to patient on increasing calories and protein and soft protein foods. Questions were answered.  Monitoring, evaluation, goals: Patient will tolerate increased calories and protein to minimize weight loss throughout treatment and promote healing.  Next visit: Thursday, May 14 during infusion.

## 2018-11-23 NOTE — Progress Notes (Signed)
[place]  HEMATOLOGY-ONCOLOGY TeleHEALTH VISIT PROGRESS NOTE   I connected with [name] [name] on 11/23/18 at 11:30 AM EDT by video enabled telemedicine visit and verified that I am speaking with the correct person using two identifiers.   I discussed the limitations, risks, security and privacy concerns of performing an evaluation and management service by telemedicine and the availability of in-person appointments. I also discussed with the patient that there may be a patient responsible charge related to this service. The patient expressed understanding and agreed to proceed.   Other persons participating in the visit and their role in the encounter: NA   Patient's location: home   Provider's location: clinic  Patient Care Team: Patient, No Pcp Per as PCP - General (General Practice) Annia Belt, MD as Consulting Physician (Oncology)  HEME/ONC OVERVIEW: 1. 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 Melene Plan, invasive SCCa, p16+; not a candidate for TORS per ENT  -Late 10/2018 - present: definitive chemoradiation with weekly cisplatin   2. 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 Port placed in 10/2018; PEG not yet scheduled    TREATMENT REGIMEN:  Early 2000 - present: warfarin, questionable compliance   11/08/2018 - present: definitive chemoradiation with weekly cisplatin   PERTINENT NON-HEM/ONC PROBLEMS: 1. Tobacco and EtOH abuse 2. Paroxysmal A-fib 3. 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, p16+  -S/p 2 cycles of weekly cisplatin concurrent with RT in the definitive setting -Labs adequate today, proceed with Cycle 3 of chemotherapy -PEG placement has not yet been scheduled; patient reported that he called [facility] to schedule the procedure but did not receive a call back -I have contacted the H&N navigator to assist with scheduling for the procedure ASAP -When/if the patient is scheduled for PEG tube placement, patient can hold his warfarin for 5 days prior to the procedure, resume it in 1-2 days after the procedure  -PRN anti-emetics: Zofran, Compazine, Ativan and dexamethasone  Recurrent DVT's of the RLE and remote hx of PTE in 1990's -Patient has been on warfarin since 2000's, and denies any abnormal bleeding or bruising -He was previously followed by Coumadin clinic, but since Dr. Azucena Freed retirement, patient's care has not been transitioned to another provider within the group. I was informed that patient could no longer be monitored by the Coumadin clinic due to lack of PCP, even though the reason for the lack of care transition was due to his PCP's office being closed in the setting of COVID-19 outbreak. -We discussed about various options of anticoagulation therapies including warfarin, low molecular weight heparin such as Lovenox or direct oral anticoagulants such as rivaroxaban and apixiban. Some of the risks and benefits discussed including costs involved, the need  for monitoring, risks of life-threatening bleeding/hospitalization, reversibility of each agent in the event of bleeding or overdose, safety profile of each drug and  taking into account other social issues such as ease of administration of medications, etc.  -The patient is concerned about the cost of the DOAC's, such as Eliquis, as he does not yet have insurance -I have reached out to the financial aid office and see if he qualifies for any patient assistance program for Eliquis -If eligible, we can transition him from warfarin to Eliquis for better compliance and consistent anticoagulation effect  -Meanwhile, we will periodically monitor his INR and try to titrate his warfarin dose as needed   -When/if the patient is scheduled for PEG tube placement, patient can hold his warfarin for 5 days prior to the procedure, resume it in 1-2 days after the procedure   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 -Hgb 16.4, improving -See discussion regarding EtOH and tobacco use below -We will monitor for now  Erythematous rash -New; faint, erythematous without any associated pruritis or tenderness -Etiology unclear -I encouraged the patient to monitor it closely and for any worsening spread or desqumationa -Meanwhile, he can try OTC hydrocortisone cream as needed   Cancer-related pain -Primary localized to tonsil malignancy -Overall, pain remains relatively mild opioid pain medication -I counseled the patient regarding the potential risks of excess NSAID use, which may increase the risk of renal injury  EtOH abuse -Patient reports that he currently drinks approximately once per week (~4 oz of liquor), which is significantly less prior to starting chemoradiation.  -I counseled the patient on the importance of EtOH moderation, preferably abstinence, during, as it may interfere with chemoradiation effect and increase the risk of side effects  Tobacco abuse  -Patient reports that he has quit smoking cigarettes, and is only occasionally vaping -I encouraged patient to continue to reduce his usage of nicotine products  No orders of the defined  types were placed in this encounter.  I discussed the assessment and treatment plan with the patient. The patient was provided an opportunity to ask questions and all were answered. The patient agreed with the plan and demonstrated an understanding of the instructions.   The patient was advised to call back or seek an in-person evaluation if the symptoms worsen or if the condition fails to improve as anticipated.   I provided 40 minutes of face-to-face video visit time during this encounter, and > 50% was spent counseling as documented under my assessment & plan.   Tish Men, MD 11/23/2018 2:26 PM   CHIEF COMPLAINTS/PURPOSE OF CONSULTATION:  "I am doing fine so far"  HISTORY OF PRESENTING ILLNESS:  [name] [name] is 58 y.o. male who is being evaluated via telemedicine for squamous cell carcinoma of the tonsil on definitive chemoradiation.  Patient reports that he has had chronic intermittent numbness sensation in both feet, which was present prior to starting chemoradiation.  Since starting chemotherapy, he reports the numbness may be slightly worse, but it is not affecting his walking or other ADLs.  He reports that the taste has been less, but he still able to taste salty food.  He has a few small scattered erythematous red "dots" over the bilateral forearms started a few days ago, but they are not pruritic or tender, and overall they have not become worse.  He has been self titrating his warfarin dose, and denies any abnormal bleeding or bruising.  He is oral intake has been unchanged, and  his weight has been stable.  He reports drinking approximately 1 drink per week (approximately 4 ounces of liquor total).  He is not smoking cigarettes, and is only vaping occasionally.  He denies any other complaint today.  I have reviewed his chart and materials related to his cancer extensively and collaborated history with the patient. Summary of oncologic history is as follows:   Malignant neoplasm of  tonsillar fossa ([place])   10/10/2018 Imaging    CT neck w/ contrast: FINDINGS: Pharynx and larynx: There is asymmetric prominence of the left tonsillar region. No other mucosal or submucosal lesion is seen.  Salivary glands: Parotid and submandibular glands are normal.  Thyroid: Normal  Lymph nodes: Definite adenopathy on the left. 2 level nodes which shows some internal necrosis. The larger measures 2.2 cm in diameter and the smaller measures 1.4 cm in diameter. Suspicious level 4 node image 83 just anterior to the jugular vein measuring 9 mm short axis diameter. No worrisome nodes on the right.    10/10/2018 Imaging    CT chest:  IMPRESSION: 1. Mild right hilar adenopathy, equivocal for metastasis. No additional potential evidence of metastatic disease in the chest. PET-CT could be considered for further staging evaluation, as clinically warranted. 2. 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 Ascending thoracic aortic aneurysm. Recommend semi-annual imaging followup by CTA or MRA and referral to cardiothoracic surgery if not already obtained. This recommendation follows 2010 ACCF/AHA/AATS/ACR/ASA/SCA/SCAI/SIR/STS/SVM Guidelines for the Diagnosis and Management of Patients With Thoracic Aortic Disease. Circulation. 2010; 121: Q595-G387. Aortic aneurysm NOS (ICD10-I71.9).    10/17/2018 Initial Diagnosis    Tonsil cancer ([place])    10/18/2018 Imaging    PET: IMPRESSION: 1. 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 2. Local hypermetabolic nodal metastasis to the LEFT level II nodal station. 3. No contralateral nodal metastasis. 4. No evidence distant metastatic disease. 5. Two small pulmonary nodules in the LEFT lower lobe are favored benign. Recommend attention on follow-up. 6. Several small common iliac lymph nodes with low metabolic activity. Favor reactive adenopathy.    10/21/2018 Cancer Staging    Staging form: Pharynx -  HPV-Mediated Oropharynx, AJCC 8th Edition - Clinical: Stage I (cT2, cN1, cM0, p16+) - Signed by Eppie Gibson, MD on 10/21/2018    11/10/2018 -  Chemotherapy    The patient had palonosetron (ALOXI) injection 0.25 mg, 0.25 mg, Intravenous,  Once, 2 of 7 cycles Administration: 0.25 mg (11/10/2018), 0.25 mg (11/17/2018) CISplatin (PLATINOL) 100 mg in sodium chloride 0.9 % 500 mL chemo infusion, 40 mg/m2 = 100 mg, Intravenous,  Once, 2 of 7 cycles Administration: 100 mg (11/10/2018), 100 mg (11/17/2018) fosaprepitant (EMEND) 150 mg, dexamethasone (DECADRON) 12 mg in sodium chloride 0.9 % 145 mL IVPB, , Intravenous,  Once, 2 of 7 cycles Administration:  (11/10/2018),  (11/17/2018)  for chemotherapy treatment.      MEDICAL HISTORY:  Past Medical History:  Diagnosis Date  . 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 Benign essential HTN 01/04/2012  . Chronic anticoagulation 01/02/2013  . Dermatofibroma of forearm 01/02/2013   Left side  . Hyperlipidemia, mixed 01/04/2012  . 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 Primary hypercoagulable state ([place]) 10/01/2014  . Sinus bradycardia, chronic 01/04/2012  . Superficial thrombosis of lower extremity 05/02/2012    SURGICAL HISTORY: Past Surgical History:  Procedure  Laterality Date  . DIRECT LARYNGOSCOPY Left 10/19/2018   Procedure: DIRECT LARYNGOSCOPY;  Surgeon: Leta Baptist, MD;  Location: [facility];  Service: ENT;  Laterality: Left;  . IR IMAGING GUIDED PORT INSERTION  11/04/2018  . TONSILLECTOMY Left 10/19/2018   Procedure: BIOPSY OF LEFT TONSIL;  Surgeon: Leta Baptist, MD;  Location: [facility];  Service: ENT;  Laterality: Left;    SOCIAL HISTORY: Social History   Socioeconomic History  . Marital status: Divorced    Spouse name: Not on file  . Number of  children: 2  . Years of education: Not on file  . Highest education level: Not on file  Occupational History  . Not on file  Social Needs  . Financial resource strain: Not on file  . Food insecurity:    Worry: Not on file    Inability: Not on file  . Transportation needs:    Medical: No    Non-medical: No  Tobacco Use  . Smoking status: Former Smoker    Packs/day: 0.50    Years: 30.00    Pack years: 15.00    Types: Cigarettes    Last attempt to quit: 10/19/2018    Years since quitting: 0.0  . Smokeless tobacco: Never Used  Substance and Sexual Activity  . Alcohol use: Yes    Alcohol/week: 13.0 - 14.0 standard drinks    Types: 12 Cans of beer, 1 - 2 Shots of liquor per week    Comment: 1-2 times per week.  . Drug use: No  . Sexual activity: Not on file  Lifestyle  . Physical activity:    Days per week: Not on file    Minutes per session: Not on file  . Stress: Not on file  Relationships  . Social connections:    Talks on phone: Not on file    Gets together: Not on file    Attends religious service: Not on file    Active member of club or organization: Not on file    Attends meetings of clubs or organizations: Not on file    Relationship status: Not on file  . Intimate partner violence:    Fear of current or ex partner: No    Emotionally abused: No    Physically abused: No    Forced sexual activity: No  Other Topics Concern  . Not on file  Social History Narrative  . Not on file    FAMILY HISTORY: Family History  Problem Relation Age of Onset  . Stroke Father     ALLERGIES:  has No Known Allergies.  MEDICATIONS:  Current Outpatient Medications  Medication Sig Dispense Refill  . amLODipine (NORVASC) 10 MG tablet TAKE 1 TABLET BY MOUTH EVERY DAY 90 tablet 2  . dexamethasone (DECADRON) 4 MG tablet Take 2 tablets by mouth once a day on the day after chemotherapy and then take 2 tablets two times a day for 2 days. Take with food. 30 tablet 1  . lidocaine  (XYLOCAINE) 2 % solution Patient: Mix 1part 2% viscous lidocaine, 1part H20. Swish & swallow 59mL of diluted mixture, 33min before meals and at bedtime, up to QID 100 mL 5  . lidocaine-prilocaine (EMLA) cream Apply to affected area once 30 g 3  . LORazepam (ATIVAN) 0.5 MG tablet Take 1 tablet (0.5 mg total) by mouth every 6 (six) hours as needed (Nausea or vomiting). 30 tablet 0  . magnesium oxide (MAG-OX) 400 (241.3 Mg) MG tablet Take 1 tablet (400  mg total) by mouth 2 (two) times daily for 30 days. 60 tablet 3  . ondansetron (ZOFRAN) 8 MG tablet Take 1 tablet (8 mg total) by mouth 2 (two) times daily as needed. Start on the third day after chemotherapy. 30 tablet 1  . oxyCODONE-acetaminophen (PERCOCET) 5-325 MG tablet Take 1 tablet by mouth every 4 (four) hours as needed for severe pain. 25 tablet 0  . potassium chloride SA (K-DUR) 20 MEQ tablet Take 1 tablet (20 mEq total) by mouth daily for 30 days. 30 tablet 3  . prochlorperazine (COMPAZINE) 10 MG tablet Take 1 tablet (10 mg total) by mouth every 6 (six) hours as needed (Nausea or vomiting). 30 tablet 1  . sodium fluoride (PREVIDENT 5000 PLUS) 1.1 % CREA dental cream Apply to tooth brush. Brush teeth for 2 minutes. Spit out excess. DO NOT rinse afterwards. Repeat nightly. 1 Tube prn  . sucralfate (CARAFATE) 1 g tablet Dissolve 1 tablet in 10 mL H20 and swallow prn soreness, up to QID. 40 tablet 5  . warfarin (COUMADIN) 10 MG tablet Take 10 mg by mouth daily at 6 PM.     No current facility-administered medications for this visit.     REVIEW OF SYSTEMS:   Constitutional: ( - ) fevers, ( - )  chills , ( - ) night sweats Eyes: ( - ) blurriness of vision, ( - ) double vision, ( - ) watery eyes Ears, nose, mouth, throat, and face: ( - ) mucositis, ( - ) sore throat Respiratory: ( - ) cough, ( - ) dyspnea, ( - ) wheezes Cardiovascular: ( - ) palpitation, ( - ) chest discomfort, ( - ) lower extremity swelling Gastrointestinal:  ( - ) nausea, ( - )  heartburn, ( - ) change in bowel habits Skin: ( + ) abnormal skin rashes Lymphatics: ( - ) new lymphadenopathy, ( - ) easy bruising Neurological: ( + ) numbness, ( - ) tingling, ( - ) new weaknesses Behavioral/Psych: ( - ) mood change, ( - ) new changes  All other systems were reviewed with the patient and are negative.  PHYSICAL EXAMINATION: ECOG PERFORMANCE STATUS: 1 - Symptomatic but completely ambulatory  (The following exam findings are based on observation only due to virtual telemedicine visit)  GENERAL: alert, no distress and comfortable RESPIRATORY: normal breathing effort PSYCH: alert & oriented x 3, fluent speech SKIN: very faint small erythematous papules over the bilateral forearms, but these lesions were not well visualized due to the limited quality of the Webex visit   LABORATORY DATA:  I have reviewed the data as listed Lab Results  Component Value Date   WBC 9.6 11/16/2018   HGB 18.2 (H) 11/16/2018   HCT 52.1 (H) 11/16/2018   MCV 92.4 11/16/2018   PLT 192 11/16/2018   Lab Results  Component Value Date   NA 134 (L) 11/16/2018   K 3.3 (L) 11/16/2018   CL 92 (L) 11/16/2018   CO2 31 11/16/2018    RADIOGRAPHIC STUDIES: I have personally reviewed the radiological images as listed and agreed with the findings in the report. Ir Imaging Guided Port Insertion  Result Date: 11/04/2018 INDICATION: 58 year old male with malignant squamous cell carcinoma of the left tonsillar fossa. He presents for port catheter placement to facilitate chemotherapy. EXAM: IMPLANTED PORT A CATH PLACEMENT WITH ULTRASOUND AND FLUOROSCOPIC GUIDANCE MEDICATIONS: 2 g Ancef; The antibiotic was administered within an appropriate time interval prior to skin puncture. ANESTHESIA/SEDATION: Versed 5 mg IV; Fentanyl 100 mcg  IV; Moderate Sedation Time:  26 minutes The patient was continuously monitored during the procedure by the interventional radiology nurse under my direct supervision. FLUOROSCOPY  TIME:  0 minutes, 48 seconds (38 mGy) COMPLICATIONS: None immediate. PROCEDURE: The right neck and chest was prepped with chlorhexidine, and draped in the usual sterile fashion using maximum barrier technique (cap and mask, sterile gown, sterile gloves, large sterile sheet, hand hygiene and cutaneous antiseptic). Local anesthesia was attained by infiltration with 1% lidocaine with epinephrine. Ultrasound demonstrated patency of the right internal jugular vein, and this was documented with an image. Under real-time ultrasound guidance, this vein was accessed with a 21 gauge micropuncture needle and image documentation was performed. A small dermatotomy was made at the access site with an 11 scalpel. A 0.018" wire was advanced into the SVC and the access needle exchanged for a 103F micropuncture vascular sheath. The 0.018" wire was then removed and a 0.035" wire advanced into the IVC. An appropriate location for the subcutaneous reservoir was selected below the clavicle and an incision was made through the skin and underlying soft tissues. The subcutaneous tissues were then dissected using a combination of blunt and sharp surgical technique and a pocket was formed. A single lumen power injectable portacatheter was then tunneled through the subcutaneous tissues from the pocket to the dermatotomy and the port reservoir placed within the subcutaneous pocket. The venous access site was then serially dilated and a peel away vascular sheath placed over the wire. The wire was removed and the port catheter advanced into position under fluoroscopic guidance. The catheter tip is positioned in the superior cavoatrial junction. This was documented with a spot image. The portacatheter was then tested and found to flush and aspirate well. The port was flushed with saline followed by 100 units/mL heparinized saline. The pocket was then closed in two layers using first subdermal inverted interrupted absorbable sutures followed by a  running subcuticular suture. The epidermis was then sealed with Dermabond. The dermatotomy at the venous access site was also closed with Dermabond. IMPRESSION: Successful placement of a right IJ approach Power Port with ultrasound and fluoroscopic guidance. The catheter is ready for use. Electronically Signed   By: Jacqulynn Cadet M.D.   On: 11/04/2018 16:00    PATHOLOGY: I have reviewed the pathology reports as documented in the oncologist history.

## 2018-11-24 ENCOUNTER — Encounter: Payer: Self-pay | Admitting: Hematology

## 2018-11-24 ENCOUNTER — Other Ambulatory Visit: Payer: Self-pay

## 2018-11-24 ENCOUNTER — Telehealth: Payer: Self-pay | Admitting: *Deleted

## 2018-11-24 ENCOUNTER — Ambulatory Visit
Admission: RE | Admit: 2018-11-24 | Discharge: 2018-11-24 | Disposition: A | Discharge status: Home / Self Care | Payer: Medicaid Other | Source: Ambulatory Visit | Attending: Radiation Oncology | Admitting: Radiation Oncology

## 2018-11-24 ENCOUNTER — Other Ambulatory Visit: Payer: Self-pay | Admitting: Medical

## 2018-11-24 ENCOUNTER — Inpatient Hospital Stay: Payer: Medicaid Other

## 2018-11-24 ENCOUNTER — Inpatient Hospital Stay: Payer: Medicaid Other | Admitting: Medical

## 2018-11-24 VITALS — BP 122/82 | HR 96 | Temp 97.5°F | Resp 17

## 2018-11-24 DIAGNOSIS — Z86718 Personal history of other venous thrombosis and embolism: Secondary | ICD-10-CM | POA: Diagnosis not present

## 2018-11-24 DIAGNOSIS — C770 Secondary and unspecified malignant neoplasm of lymph nodes of head, face and neck: Secondary | ICD-10-CM | POA: Diagnosis not present

## 2018-11-24 DIAGNOSIS — L309 Dermatitis, unspecified: Secondary | ICD-10-CM

## 2018-11-24 DIAGNOSIS — C090 Malignant neoplasm of tonsillar fossa: Secondary | ICD-10-CM | POA: Diagnosis not present

## 2018-11-24 DIAGNOSIS — Z5111 Encounter for antineoplastic chemotherapy: Secondary | ICD-10-CM | POA: Diagnosis not present

## 2018-11-24 DIAGNOSIS — Z86711 Personal history of pulmonary embolism: Secondary | ICD-10-CM | POA: Diagnosis not present

## 2018-11-24 DIAGNOSIS — Z79899 Other long term (current) drug therapy: Secondary | ICD-10-CM | POA: Diagnosis not present

## 2018-11-24 DIAGNOSIS — Z7901 Long term (current) use of anticoagulants: Secondary | ICD-10-CM | POA: Diagnosis not present

## 2018-11-24 DIAGNOSIS — R21 Rash and other nonspecific skin eruption: Secondary | ICD-10-CM

## 2018-11-24 DIAGNOSIS — Z87891 Personal history of nicotine dependence: Secondary | ICD-10-CM | POA: Diagnosis not present

## 2018-11-24 DIAGNOSIS — D6859 Other primary thrombophilia: Secondary | ICD-10-CM | POA: Diagnosis not present

## 2018-11-24 DIAGNOSIS — I10 Essential (primary) hypertension: Secondary | ICD-10-CM | POA: Diagnosis not present

## 2018-11-24 DIAGNOSIS — Z510 Encounter for antineoplastic radiation therapy: Secondary | ICD-10-CM | POA: Diagnosis not present

## 2018-11-24 MED ORDER — SODIUM CHLORIDE 0.9 % IV SOLN
Freq: Once | INTRAVENOUS | Status: AC
  Administered 2018-11-24: 11:00:00 via INTRAVENOUS
  Filled 2018-11-24: qty 5

## 2018-11-24 MED ORDER — PALONOSETRON HCL INJECTION 0.25 MG/5ML
0.2500 mg | Freq: Once | INTRAVENOUS | Status: AC
  Administered 2018-11-24: 0.25 mg via INTRAVENOUS

## 2018-11-24 MED ORDER — SODIUM CHLORIDE 0.9% FLUSH
10.0000 mL | INTRAVENOUS | Status: DC | PRN
  Administered 2018-11-24: 10 mL
  Filled 2018-11-24: qty 10

## 2018-11-24 MED ORDER — FLUTICASONE PROPIONATE 0.05 % EX LOTN: 1.0000 "application " | TOPICAL_LOTION | Freq: Two times a day (BID) | CUTANEOUS | 1 refills | Status: DC

## 2018-11-24 MED ORDER — SODIUM CHLORIDE 0.9 % IV SOLN
40.0000 mg/m2 | Freq: Once | INTRAVENOUS | Status: AC
  Administered 2018-11-24: 100 mg via INTRAVENOUS
  Filled 2018-11-24: qty 100

## 2018-11-24 MED ORDER — SODIUM CHLORIDE 0.9 % IV SOLN
Freq: Once | INTRAVENOUS | Status: AC
  Administered 2018-11-24: 08:00:00 via INTRAVENOUS
  Filled 2018-11-24: qty 250

## 2018-11-24 MED ORDER — HEPARIN SOD (PORK) LOCK FLUSH 100 UNIT/ML IV SOLN
500.0000 [IU] | Freq: Once | INTRAVENOUS | Status: AC | PRN
  Administered 2018-11-24: 15:00:00 500 [IU]
  Filled 2018-11-24: qty 5

## 2018-11-24 MED ORDER — POTASSIUM CHLORIDE 2 MEQ/ML IV SOLN
Freq: Once | INTRAVENOUS | Status: AC
  Administered 2018-11-24: 08:00:00 via INTRAVENOUS
  Filled 2018-11-24: qty 10

## 2018-11-24 MED ORDER — PALONOSETRON HCL INJECTION 0.25 MG/5ML
INTRAVENOUS | Status: AC
  Filled 2018-11-24: qty 5

## 2018-11-24 NOTE — Progress Notes (Signed)
Pt is approved for the $700 [facility] grant.  °

## 2018-11-24 NOTE — Patient Instructions (Signed)
[facility] Discharge Instructions for Patients Receiving Chemotherapy  Today you received the following chemotherapy agents: Cisplatin   To help prevent nausea and vomiting after your treatment, we encourage you to take your nausea medication as directed.    If you develop nausea and vomiting that is not controlled by your nausea medication, call the clinic.   BELOW ARE SYMPTOMS THAT SHOULD BE REPORTED IMMEDIATELY:  *FEVER GREATER THAN 100.5 F  *CHILLS WITH OR WITHOUT FEVER  NAUSEA AND VOMITING THAT IS NOT CONTROLLED WITH YOUR NAUSEA MEDICATION  *UNUSUAL SHORTNESS OF BREATH  *UNUSUAL BRUISING OR BLEEDING  TENDERNESS IN MOUTH AND THROAT WITH OR WITHOUT PRESENCE OF ULCERS  *URINARY PROBLEMS  *BOWEL PROBLEMS  UNUSUAL RASH Items with * indicate a potential emergency and should be followed up as soon as possible.  Feel free to call the clinic should you have any questions or concerns. The clinic phone number is [phone].  Please show the CHEMO ALERT CARD at check-in to the Emergency Department and triage nurse.  Coronavirus (COVID-19) Are you at risk?  Are you at risk for the Coronavirus (COVID-19)?  To be considered HIGH RISK for Coronavirus (COVID-19), you have to meet the following criteria:  . Traveled to [place], [place], [place], [place] or [place]; or in the [place] to [place], [place], [place], or [place]; and have fever, cough, and shortness of breath within the last 2 weeks of travel OR . Been in close contact with a person diagnosed with COVID-19 within the last 2 weeks and have fever, cough, and shortness of breath . IF YOU DO NOT MEET THESE CRITERIA, YOU ARE CONSIDERED LOW RISK FOR COVID-19.  What to do if you are HIGH RISK for COVID-19?  . If you are having a medical emergency, call 911. . Seek medical care right away. Before you go to a doctor's office, urgent care or emergency department, call ahead and tell them about your  recent travel, contact with someone diagnosed with COVID-19, and your symptoms. You should receive instructions from your physician's office regarding next steps of care.  . When you arrive at healthcare provider, tell the healthcare staff immediately you have returned from visiting [place], [place], [place], [place] or [place]; or traveled in the [place] to [place], [place], [place], or [place]; in the last two weeks or you have been in close contact with a person diagnosed with COVID-19 in the last 2 weeks.   . Tell the health care staff about your symptoms: fever, cough and shortness of breath. . After you have been seen by a medical provider, you will be either: o Tested for (COVID-19) and discharged home on quarantine except to seek medical care if symptoms worsen, and asked to  - Stay home and avoid contact with others until you get your results (4-5 days)  - Avoid travel on public transportation if possible (such as bus, train, or airplane) or o Sent to the Emergency Department by EMS for evaluation, COVID-19 testing, and possible admission depending on your condition and test results.  What to do if you are LOW RISK for COVID-19?  Reduce your risk of any infection by using the same precautions used for avoiding the common cold or flu:  . Wash your hands often with soap and warm water for at least 20 seconds.  If soap and water are not readily available, use an alcohol-based hand sanitizer with at least 60% alcohol.  . If coughing   or sneezing, cover your mouth and nose by coughing or sneezing into the elbow areas of your shirt or coat, into a tissue or into your sleeve (not your hands). . Avoid shaking hands with others and consider head nods or verbal greetings only. . Avoid touching your eyes, nose, or mouth with unwashed hands.  . Avoid close contact with people who are sick. . Avoid places or events with large numbers of people in one location, like concerts or sporting  events. . Carefully consider travel plans you have or are making. . If you are planning any travel outside or inside the [place], visit the CDC's Travelers' Health webpage for the latest health notices. . If you have some symptoms but not all symptoms, continue to monitor at home and seek medical attention if your symptoms worsen. . If you are having a medical emergency, call 911.   ADDITIONAL HEALTHCARE OPTIONS FOR PATIENTS  [facility] / e-Visit: [email or web address]         [facility]: [phone]  [facility]: [phone]                   [facility]: [phone]   

## 2018-11-24 NOTE — Telephone Encounter (Signed)
[name] [name]- You sent this today  [employer]

## 2018-11-24 NOTE — Telephone Encounter (Signed)
Oncology Nurse Navigator Documentation  Spoke with Sunday Spillers, CCS.  She confirmed receipt of call yesterday from Dr. 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  I noted [facility] physicians able to view Dr. [name] PN from yesterday in [place].  She acknowledge, indicated surgical date now pending.    Gayleen Orem, RN, BSN Head & Neck Oncology Nurse [place] [facility] [phone]

## 2018-11-25 ENCOUNTER — Other Ambulatory Visit: Payer: Self-pay

## 2018-11-25 ENCOUNTER — Ambulatory Visit
Admission: RE | Admit: 2018-11-25 | Discharge: 2018-11-25 | Disposition: A | Discharge status: Home / Self Care | Payer: Medicaid Other | Source: Ambulatory Visit | Attending: Radiation Oncology | Admitting: Radiation Oncology

## 2018-11-25 DIAGNOSIS — Z7901 Long term (current) use of anticoagulants: Secondary | ICD-10-CM | POA: Diagnosis not present

## 2018-11-25 DIAGNOSIS — C090 Malignant neoplasm of tonsillar fossa: Secondary | ICD-10-CM | POA: Diagnosis not present

## 2018-11-25 DIAGNOSIS — Z79899 Other long term (current) drug therapy: Secondary | ICD-10-CM | POA: Diagnosis not present

## 2018-11-25 DIAGNOSIS — Z87891 Personal history of nicotine dependence: Secondary | ICD-10-CM | POA: Diagnosis not present

## 2018-11-25 DIAGNOSIS — Z86711 Personal history of pulmonary embolism: Secondary | ICD-10-CM | POA: Diagnosis not present

## 2018-11-25 DIAGNOSIS — Z86718 Personal history of other venous thrombosis and embolism: Secondary | ICD-10-CM | POA: Diagnosis not present

## 2018-11-25 DIAGNOSIS — Z510 Encounter for antineoplastic radiation therapy: Secondary | ICD-10-CM | POA: Diagnosis not present

## 2018-11-25 DIAGNOSIS — D6859 Other primary thrombophilia: Secondary | ICD-10-CM | POA: Diagnosis not present

## 2018-11-25 DIAGNOSIS — I10 Essential (primary) hypertension: Secondary | ICD-10-CM | POA: Diagnosis not present

## 2018-11-25 DIAGNOSIS — C770 Secondary and unspecified malignant neoplasm of lymph nodes of head, face and neck: Secondary | ICD-10-CM | POA: Diagnosis not present

## 2018-11-25 NOTE — Progress Notes (Signed)
The patient was seen in the infusion room today.  He reported having a diffuse nonpruritic macular rash over his bilateral upper extremities.  He has had no change in activity or medications recently.  He was given a prescription for fluticasone 0.05% lotion to apply twice daily as needed.  He was told not to use this on his face.  Sandi Mealy, MHS, PA-C Physician Assistant

## 2018-11-28 ENCOUNTER — Other Ambulatory Visit: Payer: Self-pay

## 2018-11-28 ENCOUNTER — Ambulatory Visit
Admission: RE | Admit: 2018-11-28 | Discharge: 2018-11-28 | Disposition: A | Discharge status: Home / Self Care | Payer: Medicaid Other | Source: Ambulatory Visit | Attending: Radiation Oncology | Admitting: Radiation Oncology

## 2018-11-28 DIAGNOSIS — Z510 Encounter for antineoplastic radiation therapy: Secondary | ICD-10-CM | POA: Diagnosis not present

## 2018-11-28 DIAGNOSIS — D6859 Other primary thrombophilia: Secondary | ICD-10-CM | POA: Diagnosis not present

## 2018-11-28 DIAGNOSIS — Z79899 Other long term (current) drug therapy: Secondary | ICD-10-CM | POA: Diagnosis not present

## 2018-11-28 DIAGNOSIS — Z7901 Long term (current) use of anticoagulants: Secondary | ICD-10-CM | POA: Diagnosis not present

## 2018-11-28 DIAGNOSIS — C770 Secondary and unspecified malignant neoplasm of lymph nodes of head, face and neck: Secondary | ICD-10-CM | POA: Diagnosis not present

## 2018-11-28 DIAGNOSIS — Z86718 Personal history of other venous thrombosis and embolism: Secondary | ICD-10-CM | POA: Diagnosis not present

## 2018-11-28 DIAGNOSIS — Z87891 Personal history of nicotine dependence: Secondary | ICD-10-CM | POA: Diagnosis not present

## 2018-11-28 DIAGNOSIS — Z86711 Personal history of pulmonary embolism: Secondary | ICD-10-CM | POA: Diagnosis not present

## 2018-11-28 DIAGNOSIS — I10 Essential (primary) hypertension: Secondary | ICD-10-CM | POA: Diagnosis not present

## 2018-11-28 DIAGNOSIS — C090 Malignant neoplasm of tonsillar fossa: Secondary | ICD-10-CM | POA: Diagnosis not present

## 2018-11-28 NOTE — Progress Notes (Signed)
[place] OFFICE PROGRESS NOTE  Patient Care Team: Patient, No Pcp Per as PCP - General (General Practice) Annia Belt, MD as Consulting Physician (Oncology)  HEME/ONC OVERVIEW: 1. 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 Melene Plan, invasive SCCa, p16+; not a candidate for TORS per ENT  -Late 10/2018 - present: definitive chemoradiation with weekly cisplatin   2. 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 Port placed in 10/2018; PEG not yet scheduled   TREATMENT REGIMEN:  Early 2000 - present: warfarin, questionable compliance   11/08/2018 - present: definitive chemoradiation with weekly cisplatin   PERTINENT NON-HEM/ONC PROBLEMS: 1. Tobacco and EtOH abuse 2. Paroxysmal A-fib 3. 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, p16+  -S/p 3 cycles of weekly cisplatin concurrent with RT in the definitive setting -Due to worsening thrombocytopenia, we will hold Cycle 4 of chemotherapy on 12/01/2018 and re-assess CBC in one week -I have provided recs on anticoagulation prior to the procedure, but PEG placement not yet scheduled by [facility]; I have asked H&N navigator to expedite his  appt  -PRN anti-emetics: Zofran, Compazine, Ativan and dexamethasone  Chemotherapy-associated thrombocytopenia -Secondary to chemotherapy -Plts 72k, significantly lower than last week -Patient denies any symptoms of bleeding or excess bruising, such as epistaxis, hematochezia, melena, or hematuria -Given that patient is only half way through his treatment and has had worsening thrombocytopenia, we will delay his chemotherapy by one -If he develops persistent thrombocytopenia, we may have to dose reduce cisplatin in the future   Recurrent DVT's of the RLE and remote hx of PTE in 1990's -Patient has been on warfarin since 2000's and has not had any abnormal bleeding or bruising -I discussed the case at length with Dr. Beryle Beams, who recently retired; he has contacted the Coumadin clinic and ensured that the patient can continue follow-up with the Coumadin clinic for INR monitoring and warfarin dose adjustment as needed -Meanwhile, I have also reached out to financial office to explore the costs of DOAC's, such as Eliquis and Xarelto, and any patient assistant program that he may qualify for -We will monitor his INR weekly with his chemotherapy labs -See warfarin dose adjustment below while on fluconazole   Erythematous rash -Possibly drug reaction -Patient was prescribed fluticasone topical cream last week; overall unchanged -We will monitor it for now   Chemotherapy-associated mucositis -Secondary to chemoradiation -Currently taking PRN ibuprofen -I counseled the patient on avoiding NSAID use, due to its potential nephrotoxicity -I have prescribed liquid IR morphine 10mg /0.40mL q6hrs PRN -I counseled the patient on the importance of minimizing EtOH use while taking opioid pain medication, as their concurrent use significantly increases her risk of respiratory complications  Oral candidiasis -New; exam notable for mild  oral candidiasis -I have prescribed fluconazole daily x 3  weeks -Due to the potential increased anticoagulation effect while on warfarin, I have reduced the patient's warfarin dose from 10mg  to 5mg  daily -Weekly INR monitoring as above   EtOH abuse -Patient has reduced his alcohol use significantly since starting treatment, and is only drinking approximately once per week -I counseled the patient on importance of alcohol moderation, preferably abstinence, as it may interfere with treatment efficacy, and increase the risk of adverse outcomes from concurrent opioid use, should he need it for radiation-induced mucositis -Patient expressed understanding, and agreed with the plan  No orders of the defined types were placed in this encounter.  All questions were answered. The patient knows to call the clinic with any problems, questions or concerns. No barriers to learning was detected.  Return in one week for labs, port flush, clinic appt and Dose #4 of weekly cisplatin.   Tish Men, MD 12/01/2018 8:44 AM  CHIEF COMPLAINT: "I am doing okay so far"  INTERVAL HISTORY: [name] [name] to clinic for follow-up of squamous cell carcinoma of the tonsil on definitive chemoradiation.  Patient reports that he has tolerated the treatment relatively well so far, except he has noticed some increase in the pain in the back of the throat and his mouth.  He has been using salt/soda rinses intermittently.  He is still able to maintain reasonable oral intake, including spaghetti, pasta, and approximately a quart of milk plus ~48 oz of water per day.  He has taken ibuprofen occasionally for the throat pain.  He has lost about 5 pounds since last visit.  He has not yet received a phone call from [place] surgery regarding the timing of the feeding tube placement, but has cut his warfarin dose down from 10 mg to 5 mg daily because he thought he might be getting the surgery soon.  He denies any abnormal bleeding or bruising.  The rash over the bilateral forearms remains  overall unchanged, and he has not seen any  rash on other parts of the body.  He denies any other complaint today.  SUMMARY OF ONCOLOGIC HISTORY:   Malignant neoplasm of tonsillar fossa ([place])   10/10/2018 Imaging    CT neck w/ contrast: FINDINGS: Pharynx and larynx: There is asymmetric prominence of the left tonsillar region. No other mucosal or submucosal lesion is seen.  Salivary glands: Parotid and submandibular glands are normal.  Thyroid: Normal  Lymph nodes: Definite adenopathy on the left. 2 level nodes which shows some internal necrosis. The larger measures 2.2 cm in diameter and the smaller measures 1.4 cm in diameter. Suspicious level 4 node image 83 just anterior to the jugular vein measuring 9 mm short axis diameter. No worrisome nodes on the right.    10/10/2018 Imaging    CT chest:  IMPRESSION: 1. Mild right hilar adenopathy, equivocal for metastasis. No additional potential evidence of metastatic disease in the chest. PET-CT could be considered for further staging evaluation, as clinically warranted. 2. 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 Ascending thoracic aortic aneurysm. Recommend semi-annual imaging followup by CTA or MRA and referral to cardiothoracic surgery if not already obtained. This recommendation follows 2010 ACCF/AHA/AATS/ACR/ASA/SCA/SCAI/SIR/STS/SVM Guidelines for the Diagnosis and Management of Patients With Thoracic Aortic Disease. Circulation. 2010; 121: K270-W237. Aortic aneurysm NOS (ICD10-I71.9).    10/17/2018 Initial Diagnosis    Tonsil cancer ([place])    10/18/2018 Imaging    PET: IMPRESSION: 1. Hypermetabolic LEFT lingual tonsil fullness consistent with  primary carcinoma. 2. Local hypermetabolic nodal metastasis to the LEFT level II nodal station. 3. No contralateral nodal metastasis. 4. No evidence distant metastatic disease. 5. Two small pulmonary nodules in the LEFT lower lobe are favored benign. Recommend attention on  follow-up. 6. Several small common iliac lymph nodes with low metabolic activity. Favor reactive adenopathy.    10/21/2018 Cancer Staging    Staging form: Pharynx - HPV-Mediated Oropharynx, AJCC 8th Edition - Clinical: Stage I (cT2, cN1, cM0, p16+) - Signed by Eppie Gibson, MD on 10/21/2018    11/10/2018 -  Chemotherapy    The patient had palonosetron (ALOXI) injection 0.25 mg, 0.25 mg, Intravenous,  Once, 3 of 7 cycles Administration: 0.25 mg (11/10/2018), 0.25 mg (11/17/2018), 0.25 mg (11/24/2018) CISplatin (PLATINOL) 100 mg in sodium chloride 0.9 % 500 mL chemo infusion, 40 mg/m2 = 100 mg, Intravenous,  Once, 3 of 7 cycles Administration: 100 mg (11/10/2018), 100 mg (11/17/2018), 100 mg (11/24/2018) fosaprepitant (EMEND) 150 mg, dexamethasone (DECADRON) 12 mg in sodium chloride 0.9 % 145 mL IVPB, , Intravenous,  Once, 3 of 7 cycles Administration:  (11/10/2018),  (11/17/2018),  (11/24/2018)  for chemotherapy treatment.      REVIEW OF SYSTEMS:   Constitutional: ( - ) fevers, ( - )  chills , ( - ) night sweats Eyes: ( - ) blurriness of vision, ( - ) double vision, ( - ) watery eyes Ears, nose, mouth, throat, and face: ( + ) mucositis, ( + ) sore throat Respiratory: ( - ) cough, ( - ) dyspnea, ( - ) wheezes Cardiovascular: ( - ) palpitation, ( - ) chest discomfort, ( - ) lower extremity swelling Gastrointestinal:  ( - ) nausea, ( - ) heartburn, ( - ) change in bowel habits Skin: ( + ) abnormal skin rashes Lymphatics: ( - ) new lymphadenopathy, ( - ) easy bruising Neurological: ( - ) numbness, ( - ) tingling, ( - ) new weaknesses Behavioral/Psych: ( - ) mood change, ( - ) new changes  All other systems were reviewed with the patient and are negative.  I have reviewed the past medical history, past surgical history, social history and family history with the patient and they are unchanged from previous note.  ALLERGIES:  has No Known Allergies.  MEDICATIONS:  Current Outpatient Medications   Medication Sig Dispense Refill  . amLODipine (NORVASC) 10 MG tablet TAKE 1 TABLET BY MOUTH EVERY DAY 90 tablet 2  . Fluticasone Propionate 0.05 % LOTN Apply 1 application topically 2 (two) times daily. Apply to rash twice daily as needed 120 mL 1  . lidocaine (XYLOCAINE) 2 % solution Patient: Mix 1part 2% viscous lidocaine, 1part H20. Swish & swallow 46mL of diluted mixture, 31min before meals and at bedtime, up to QID 100 mL 5  . magnesium oxide (MAG-OX) 400 (241.3 Mg) MG tablet Take 1 tablet (400 mg total) by mouth 2 (two) times daily for 30 days. 60 tablet 3  . ondansetron (ZOFRAN) 8 MG tablet Take 1 tablet (8 mg total) by mouth 2 (two) times daily as needed. Start on the third day after chemotherapy. 30 tablet 1  . oxyCODONE-acetaminophen (PERCOCET) 5-325 MG tablet Take 1 tablet by mouth every 4 (four) hours as needed for severe pain. 25 tablet 0  . potassium chloride SA (K-DUR) 20 MEQ tablet Take 1 tablet (20 mEq total) by mouth daily for 30 days. 30 tablet 3  . sodium fluoride (PREVIDENT 5000 PLUS) 1.1 % CREA dental cream Apply to tooth  brush. Brush teeth for 2 minutes. Spit out excess. DO NOT rinse afterwards. Repeat nightly. 1 Tube prn  . warfarin (COUMADIN) 10 MG tablet Take 5 mg by mouth daily at 6 PM.     . dexamethasone (DECADRON) 4 MG tablet Take 2 tablets by mouth once a day on the day after chemotherapy and then take 2 tablets two times a day for 2 days. Take with food. (Patient not taking: Reported on 11/30/2018) 30 tablet 1  . fluconazole (DIFLUCAN) 100 MG tablet Take 2 tabs on the first day, and then 1 tab daily until finished 22 tablet 0  . lidocaine-prilocaine (EMLA) cream Apply to affected area once (Patient not taking: Reported on 11/30/2018) 30 g 3  . LORazepam (ATIVAN) 0.5 MG tablet Take 1 tablet (0.5 mg total) by mouth every 6 (six) hours as needed (Nausea or vomiting). (Patient not taking: Reported on 11/30/2018) 30 tablet 0  . Morphine Sulfate (MORPHINE CONCENTRATE) 10 mg /  0.5 ml concentrated solution Take 0.5 mLs (10 mg total) by mouth every 6 (six) hours as needed for up to 30 days for severe pain. 60 mL 0  . prochlorperazine (COMPAZINE) 10 MG tablet Take 1 tablet (10 mg total) by mouth every 6 (six) hours as needed (Nausea or vomiting). (Patient not taking: Reported on 11/30/2018) 30 tablet 1  . sucralfate (CARAFATE) 1 g tablet Dissolve 1 tablet in 10 mL H20 and swallow prn soreness, up to QID. (Patient not taking: Reported on 11/30/2018) 40 tablet 5   No current facility-administered medications for this visit.    Facility-Administered Medications Ordered in Other Visits  Medication Dose Route Frequency Provider Last Rate Last Dose  . heparin lock flush 100 unit/mL  500 Units Intravenous Once Tish Men, MD      . sodium chloride flush (NS) 0.9 % injection 10 mL  10 mL Intravenous PRN Tish Men, MD        PHYSICAL EXAMINATION: ECOG PERFORMANCE STATUS: 1 - Symptomatic but completely ambulatory  Today's Vitals   11/30/18 1535  BP: 124/78  Pulse: 99  Resp: 19  Temp: 98.2 F (36.8 C)  TempSrc: Oral  SpO2: 98%  Weight: 247 lb 4.8 oz (112.2 kg)  Height: 6\' 3"  (1.905 m)  PainSc: 3    Body mass index is 30.91 kg/m.  Filed Weights   11/30/18 1535  Weight: 247 lb 4.8 oz (112.2 kg)    GENERAL: alert, no distress and comfortable SKIN: very faint, small erythematous papules over the bilateral forearms, mild erythema over the neck area from radiation EYES: conjunctiva are pink and non-injected, sclera clear OROPHARYNX: small area of oral candidiasis  NECK: supple, non-tender LUNGS: clear to auscultation with normal breathing effort HEART: regular rate & rhythm and no murmurs and no lower extremity edema ABDOMEN: soft, non-tender, non-distended, normal bowel sounds Musculoskeletal: no cyanosis of digits and no clubbing  PSYCH: alert & oriented x 3, fluent speech NEURO: no focal motor/sensory deficits  LABORATORY DATA:  I have reviewed the data as  listed    Component Value Date/Time   NA 137 11/30/2018 1507   NA 142 10/03/2018 1450   NA 141 05/21/2014 1226   K 4.1 11/30/2018 1507   K 4.3 05/21/2014 1226   CL 98 11/30/2018 1507   CL 108 (H) 12/28/2012 1405   CO2 30 11/30/2018 1507   CO2 28 05/21/2014 1226   GLUCOSE 85 11/30/2018 1507   GLUCOSE 101 05/21/2014 1226   GLUCOSE 95 12/28/2012 1405  BUN 24 (H) 11/30/2018 1507   BUN 15 10/03/2018 1450   BUN 13.2 05/21/2014 1226   CREATININE 0.93 11/30/2018 1507   CREATININE 1.00 09/24/2014 1413   CREATININE 0.9 05/21/2014 1226   CALCIUM 9.2 11/30/2018 1507   CALCIUM 9.3 05/21/2014 1226   PROT 7.4 10/21/2018 1148   PROT 7.2 10/03/2018 1450   PROT 7.1 01/08/2014 1203   ALBUMIN 4.4 10/21/2018 1148   ALBUMIN 4.3 10/03/2018 1450   ALBUMIN 3.7 01/08/2014 1203   AST 20 10/21/2018 1148   AST 50 (H) 01/08/2014 1203   ALT 19 10/21/2018 1148   ALT 83 (H) 01/08/2014 1203   ALKPHOS 73 10/21/2018 1148   ALKPHOS 87 01/08/2014 1203   BILITOT 0.6 10/21/2018 1148   BILITOT 0.67 01/08/2014 1203   GFRNONAA >60 11/30/2018 1507   GFRNONAA >60 10/15/2011 0224   GFRAA >60 11/30/2018 1507   GFRAA >60 10/15/2011 0224    No results found for: SPEP, UPEP  Lab Results  Component Value Date   WBC 5.7 11/30/2018   NEUTROABS 4.8 11/30/2018   HGB 16.2 11/30/2018   HCT 47.1 11/30/2018   MCV 93.6 11/30/2018   PLT 72 (L) 11/30/2018      Chemistry      Component Value Date/Time   NA 137 11/30/2018 1507   NA 142 10/03/2018 1450   NA 141 05/21/2014 1226   K 4.1 11/30/2018 1507   K 4.3 05/21/2014 1226   CL 98 11/30/2018 1507   CL 108 (H) 12/28/2012 1405   CO2 30 11/30/2018 1507   CO2 28 05/21/2014 1226   BUN 24 (H) 11/30/2018 1507   BUN 15 10/03/2018 1450   BUN 13.2 05/21/2014 1226   CREATININE 0.93 11/30/2018 1507   CREATININE 1.00 09/24/2014 1413   CREATININE 0.9 05/21/2014 1226      Component Value Date/Time   CALCIUM 9.2 11/30/2018 1507   CALCIUM 9.3 05/21/2014 1226   ALKPHOS  73 10/21/2018 1148   ALKPHOS 87 01/08/2014 1203   AST 20 10/21/2018 1148   AST 50 (H) 01/08/2014 1203   ALT 19 10/21/2018 1148   ALT 83 (H) 01/08/2014 1203   BILITOT 0.6 10/21/2018 1148   BILITOT 0.67 01/08/2014 1203       RADIOGRAPHIC STUDIES: I have personally reviewed the radiological images as listed below and agreed with the findings in the report. Ir Imaging Guided Port Insertion  Result Date: 11/04/2018 INDICATION: 58 year old male with malignant squamous cell carcinoma of the left tonsillar fossa. He presents for port catheter placement to facilitate chemotherapy. EXAM: IMPLANTED PORT A CATH PLACEMENT WITH ULTRASOUND AND FLUOROSCOPIC GUIDANCE MEDICATIONS: 2 g Ancef; The antibiotic was administered within an appropriate time interval prior to skin puncture. ANESTHESIA/SEDATION: Versed 5 mg IV; Fentanyl 100 mcg IV; Moderate Sedation Time:  26 minutes The patient was continuously monitored during the procedure by the interventional radiology nurse under my direct supervision. FLUOROSCOPY TIME:  0 minutes, 48 seconds (38 mGy) COMPLICATIONS: None immediate. PROCEDURE: The right neck and chest was prepped with chlorhexidine, and draped in the usual sterile fashion using maximum barrier technique (cap and mask, sterile gown, sterile gloves, large sterile sheet, hand hygiene and cutaneous antiseptic). Local anesthesia was attained by infiltration with 1% lidocaine with epinephrine. Ultrasound demonstrated patency of the right internal jugular vein, and this was documented with an image. Under real-time ultrasound guidance, this vein was accessed with a 21 gauge micropuncture needle and image documentation was performed. A small dermatotomy was made at the access  site with an 11 scalpel. A 0.018" wire was advanced into the SVC and the access needle exchanged for a 107F micropuncture vascular sheath. The 0.018" wire was then removed and a 0.035" wire advanced into the IVC. An appropriate location for the  subcutaneous reservoir was selected below the clavicle and an incision was made through the skin and underlying soft tissues. The subcutaneous tissues were then dissected using a combination of blunt and sharp surgical technique and a pocket was formed. A single lumen power injectable portacatheter was then tunneled through the subcutaneous tissues from the pocket to the dermatotomy and the port reservoir placed within the subcutaneous pocket. The venous access site was then serially dilated and a peel away vascular sheath placed over the wire. The wire was removed and the port catheter advanced into position under fluoroscopic guidance. The catheter tip is positioned in the superior cavoatrial junction. This was documented with a spot image. The portacatheter was then tested and found to flush and aspirate well. The port was flushed with saline followed by 100 units/mL heparinized saline. The pocket was then closed in two layers using first subdermal inverted interrupted absorbable sutures followed by a running subcuticular suture. The epidermis was then sealed with Dermabond. The dermatotomy at the venous access site was also closed with Dermabond. IMPRESSION: Successful placement of a right IJ approach Power Port with ultrasound and fluoroscopic guidance. The catheter is ready for use. Electronically Signed   By: Jacqulynn Cadet M.D.   On: 11/04/2018 16:00

## 2018-11-29 ENCOUNTER — Ambulatory Visit
Admission: RE | Admit: 2018-11-29 | Discharge: 2018-11-29 | Disposition: A | Discharge status: Home / Self Care | Payer: Medicaid Other | Source: Ambulatory Visit | Attending: Radiation Oncology | Admitting: Radiation Oncology

## 2018-11-29 ENCOUNTER — Other Ambulatory Visit: Payer: Self-pay

## 2018-11-29 DIAGNOSIS — I10 Essential (primary) hypertension: Secondary | ICD-10-CM | POA: Diagnosis not present

## 2018-11-29 DIAGNOSIS — Z86718 Personal history of other venous thrombosis and embolism: Secondary | ICD-10-CM | POA: Diagnosis not present

## 2018-11-29 DIAGNOSIS — C090 Malignant neoplasm of tonsillar fossa: Secondary | ICD-10-CM | POA: Diagnosis not present

## 2018-11-29 DIAGNOSIS — Z7901 Long term (current) use of anticoagulants: Secondary | ICD-10-CM | POA: Diagnosis not present

## 2018-11-29 DIAGNOSIS — Z87891 Personal history of nicotine dependence: Secondary | ICD-10-CM | POA: Diagnosis not present

## 2018-11-29 DIAGNOSIS — C770 Secondary and unspecified malignant neoplasm of lymph nodes of head, face and neck: Secondary | ICD-10-CM | POA: Diagnosis not present

## 2018-11-29 DIAGNOSIS — Z79899 Other long term (current) drug therapy: Secondary | ICD-10-CM | POA: Diagnosis not present

## 2018-11-29 DIAGNOSIS — Z510 Encounter for antineoplastic radiation therapy: Secondary | ICD-10-CM | POA: Diagnosis not present

## 2018-11-29 DIAGNOSIS — Z86711 Personal history of pulmonary embolism: Secondary | ICD-10-CM | POA: Diagnosis not present

## 2018-11-29 DIAGNOSIS — D6859 Other primary thrombophilia: Secondary | ICD-10-CM | POA: Diagnosis not present

## 2018-11-30 ENCOUNTER — Inpatient Hospital Stay: Payer: Medicaid Other

## 2018-11-30 ENCOUNTER — Ambulatory Visit
Admission: RE | Admit: 2018-11-30 | Discharge: 2018-11-30 | Disposition: A | Discharge status: Home / Self Care | Payer: Medicaid Other | Source: Ambulatory Visit | Attending: Radiation Oncology | Admitting: Radiation Oncology

## 2018-11-30 ENCOUNTER — Other Ambulatory Visit: Payer: Self-pay

## 2018-11-30 ENCOUNTER — Encounter: Payer: Self-pay | Admitting: Hematology

## 2018-11-30 ENCOUNTER — Telehealth: Payer: Self-pay | Admitting: Hematology

## 2018-11-30 ENCOUNTER — Inpatient Hospital Stay (HOSPITAL_BASED_OUTPATIENT_CLINIC_OR_DEPARTMENT_OTHER): Payer: Medicaid Other | Admitting: Hematology

## 2018-11-30 VITALS — BP 124/78 | HR 99 | Temp 98.2°F | Resp 19 | Ht 75.0 in | Wt 247.3 lb

## 2018-11-30 DIAGNOSIS — Z87891 Personal history of nicotine dependence: Secondary | ICD-10-CM | POA: Diagnosis not present

## 2018-11-30 DIAGNOSIS — K1231 Oral mucositis (ulcerative) due to antineoplastic therapy: Secondary | ICD-10-CM | POA: Diagnosis not present

## 2018-11-30 DIAGNOSIS — Z510 Encounter for antineoplastic radiation therapy: Secondary | ICD-10-CM | POA: Diagnosis not present

## 2018-11-30 DIAGNOSIS — Z86718 Personal history of other venous thrombosis and embolism: Secondary | ICD-10-CM | POA: Diagnosis not present

## 2018-11-30 DIAGNOSIS — D6959 Other secondary thrombocytopenia: Secondary | ICD-10-CM

## 2018-11-30 DIAGNOSIS — I825Y1 Chronic embolism and thrombosis of unspecified deep veins of right proximal lower extremity: Secondary | ICD-10-CM

## 2018-11-30 DIAGNOSIS — I2782 Chronic pulmonary embolism: Secondary | ICD-10-CM

## 2018-11-30 DIAGNOSIS — Z5111 Encounter for antineoplastic chemotherapy: Secondary | ICD-10-CM | POA: Diagnosis not present

## 2018-11-30 DIAGNOSIS — C090 Malignant neoplasm of tonsillar fossa: Secondary | ICD-10-CM

## 2018-11-30 DIAGNOSIS — L539 Erythematous condition, unspecified: Secondary | ICD-10-CM

## 2018-11-30 DIAGNOSIS — Z79899 Other long term (current) drug therapy: Secondary | ICD-10-CM | POA: Diagnosis not present

## 2018-11-30 DIAGNOSIS — Z7901 Long term (current) use of anticoagulants: Secondary | ICD-10-CM | POA: Diagnosis not present

## 2018-11-30 DIAGNOSIS — Z86711 Personal history of pulmonary embolism: Secondary | ICD-10-CM | POA: Diagnosis not present

## 2018-11-30 DIAGNOSIS — C770 Secondary and unspecified malignant neoplasm of lymph nodes of head, face and neck: Secondary | ICD-10-CM | POA: Diagnosis not present

## 2018-11-30 DIAGNOSIS — F1010 Alcohol abuse, uncomplicated: Secondary | ICD-10-CM

## 2018-11-30 DIAGNOSIS — B370 Candidal stomatitis: Secondary | ICD-10-CM

## 2018-11-30 DIAGNOSIS — I10 Essential (primary) hypertension: Secondary | ICD-10-CM | POA: Diagnosis not present

## 2018-11-30 DIAGNOSIS — I2609 Other pulmonary embolism with acute cor pulmonale: Secondary | ICD-10-CM

## 2018-11-30 DIAGNOSIS — D6859 Other primary thrombophilia: Secondary | ICD-10-CM | POA: Diagnosis not present

## 2018-11-30 LAB — CBC WITH DIFFERENTIAL (CANCER CENTER ONLY)
Abs Immature Granulocytes: 0.03 10*3/uL (ref 0.00–0.07)
Basophils Absolute: 0 10*3/uL (ref 0.0–0.1)
Basophils Relative: 0 %
Eosinophils Absolute: 0.1 10*3/uL (ref 0.0–0.5)
Eosinophils Relative: 1 %
HCT: 47.1 % (ref 39.0–52.0)
Hemoglobin: 16.2 g/dL (ref 13.0–17.0)
Immature Granulocytes: 1 %
Lymphocytes Relative: 7 %
Lymphs Abs: 0.4 10*3/uL — ABNORMAL LOW (ref 0.7–4.0)
MCH: 32.2 pg (ref 26.0–34.0)
MCHC: 34.4 g/dL (ref 30.0–36.0)
MCV: 93.6 fL (ref 80.0–100.0)
Monocytes Absolute: 0.5 10*3/uL (ref 0.1–1.0)
Monocytes Relative: 8 %
Neutro Abs: 4.8 10*3/uL (ref 1.7–7.7)
Neutrophils Relative %: 83 %
Platelet Count: 72 10*3/uL — ABNORMAL LOW (ref 150–400)
RBC: 5.03 MIL/uL (ref 4.22–5.81)
RDW: 13.3 % (ref 11.5–15.5)
WBC Count: 5.7 10*3/uL (ref 4.0–10.5)
nRBC: 0 % (ref 0.0–0.2)

## 2018-11-30 LAB — BASIC METABOLIC PANEL - CANCER CENTER ONLY
Anion gap: 9 (ref 5–15)
BUN: 24 mg/dL — ABNORMAL HIGH (ref 6–20)
CO2: 30 mmol/L (ref 22–32)
Calcium: 9.2 mg/dL (ref 8.9–10.3)
Chloride: 98 mmol/L (ref 98–111)
Creatinine: 0.93 mg/dL (ref 0.61–1.24)
GFR, Est AFR Am: >60 mL/min (ref >60)
GFR, Estimated: >60 mL/min (ref >60)
Glucose, Bld: 85 mg/dL (ref 70–99)
Potassium: 4.1 mmol/L (ref 3.5–5.1)
Sodium: 137 mmol/L (ref 135–145)

## 2018-11-30 LAB — PROTIME-INR
INR: 2 — ABNORMAL HIGH (ref 0.8–1.2)
Prothrombin Time: 22.1 seconds — ABNORMAL HIGH (ref 11.4–15.2)

## 2018-11-30 LAB — MAGNESIUM: Magnesium: 1.7 mg/dL (ref 1.7–2.4)

## 2018-11-30 MED ORDER — SODIUM CHLORIDE 0.9% FLUSH
10.0000 mL | INTRAVENOUS | Status: DC | PRN
  Filled 2018-11-30: qty 10

## 2018-11-30 MED ORDER — MORPHINE SULFATE (CONCENTRATE) 10 MG /0.5 ML PO SOLN: 10.0000 mg | Freq: Four times a day (QID) | ORAL | 0 refills | Status: DC | PRN

## 2018-11-30 MED ORDER — FLUCONAZOLE 100 MG PO TABS: ORAL_TABLET | ORAL | 0 refills | Status: DC

## 2018-11-30 MED ORDER — HEPARIN SOD (PORK) LOCK FLUSH 100 UNIT/ML IV SOLN
500.0000 [IU] | Freq: Once | INTRAVENOUS | Status: DC
  Filled 2018-11-30: qty 5

## 2018-11-30 MED FILL — MORPHINE SULF 100 MG/5 ML S: 100 | 30 days supply | Qty: 60 | Fill #0

## 2018-11-30 NOTE — Progress Notes (Signed)
Faxed [employer] patient assistance app for Eliquis today.  Will notify Dr. Maylon Peppers and the pt of the outcome once received.

## 2018-11-30 NOTE — Telephone Encounter (Signed)
Per 5/13 los No change in appts

## 2018-12-01 ENCOUNTER — Encounter: Payer: Self-pay | Admitting: Nutrition

## 2018-12-01 ENCOUNTER — Other Ambulatory Visit: Payer: Self-pay

## 2018-12-01 ENCOUNTER — Inpatient Hospital Stay: Payer: Medicaid Other | Admitting: Nutrition

## 2018-12-01 ENCOUNTER — Inpatient Hospital Stay: Payer: Medicaid Other

## 2018-12-01 ENCOUNTER — Ambulatory Visit
Admission: RE | Admit: 2018-12-01 | Discharge: 2018-12-01 | Disposition: A | Discharge status: Home / Self Care | Payer: Medicaid Other | Source: Ambulatory Visit | Attending: Radiation Oncology | Admitting: Radiation Oncology

## 2018-12-01 DIAGNOSIS — Z87891 Personal history of nicotine dependence: Secondary | ICD-10-CM | POA: Diagnosis not present

## 2018-12-01 DIAGNOSIS — C090 Malignant neoplasm of tonsillar fossa: Secondary | ICD-10-CM | POA: Diagnosis not present

## 2018-12-01 DIAGNOSIS — Z7901 Long term (current) use of anticoagulants: Secondary | ICD-10-CM | POA: Diagnosis not present

## 2018-12-01 DIAGNOSIS — I10 Essential (primary) hypertension: Secondary | ICD-10-CM | POA: Diagnosis not present

## 2018-12-01 DIAGNOSIS — C770 Secondary and unspecified malignant neoplasm of lymph nodes of head, face and neck: Secondary | ICD-10-CM | POA: Diagnosis not present

## 2018-12-01 DIAGNOSIS — D6859 Other primary thrombophilia: Secondary | ICD-10-CM | POA: Diagnosis not present

## 2018-12-01 DIAGNOSIS — Z86711 Personal history of pulmonary embolism: Secondary | ICD-10-CM | POA: Diagnosis not present

## 2018-12-01 DIAGNOSIS — Z79899 Other long term (current) drug therapy: Secondary | ICD-10-CM | POA: Diagnosis not present

## 2018-12-01 DIAGNOSIS — Z510 Encounter for antineoplastic radiation therapy: Secondary | ICD-10-CM | POA: Diagnosis not present

## 2018-12-01 DIAGNOSIS — Z86718 Personal history of other venous thrombosis and embolism: Secondary | ICD-10-CM | POA: Diagnosis not present

## 2018-12-01 MED FILL — FLUCONAZOLE 100 MG TAB: 100 | 21 days supply | Qty: 22 | Fill #0

## 2018-12-01 NOTE — Progress Notes (Signed)
RD working remotely.  Contacted patient who was not available. Left message for him to call me back with a good time to return call.

## 2018-12-02 ENCOUNTER — Telehealth (HOSPITAL_COMMUNITY): Payer: Self-pay

## 2018-12-02 ENCOUNTER — Telehealth: Payer: Self-pay

## 2018-12-02 ENCOUNTER — Ambulatory Visit
Admission: RE | Admit: 2018-12-02 | Discharge: 2018-12-02 | Disposition: A | Discharge status: Home / Self Care | Payer: Medicaid Other | Source: Ambulatory Visit | Attending: Radiation Oncology | Admitting: Radiation Oncology

## 2018-12-02 ENCOUNTER — Other Ambulatory Visit: Payer: Self-pay

## 2018-12-02 DIAGNOSIS — C090 Malignant neoplasm of tonsillar fossa: Secondary | ICD-10-CM | POA: Diagnosis not present

## 2018-12-02 DIAGNOSIS — I10 Essential (primary) hypertension: Secondary | ICD-10-CM | POA: Diagnosis not present

## 2018-12-02 DIAGNOSIS — Z86711 Personal history of pulmonary embolism: Secondary | ICD-10-CM | POA: Diagnosis not present

## 2018-12-02 DIAGNOSIS — Z87891 Personal history of nicotine dependence: Secondary | ICD-10-CM | POA: Diagnosis not present

## 2018-12-02 DIAGNOSIS — C770 Secondary and unspecified malignant neoplasm of lymph nodes of head, face and neck: Secondary | ICD-10-CM | POA: Diagnosis not present

## 2018-12-02 DIAGNOSIS — Z79899 Other long term (current) drug therapy: Secondary | ICD-10-CM | POA: Diagnosis not present

## 2018-12-02 DIAGNOSIS — Z7901 Long term (current) use of anticoagulants: Secondary | ICD-10-CM | POA: Diagnosis not present

## 2018-12-02 DIAGNOSIS — D6859 Other primary thrombophilia: Secondary | ICD-10-CM | POA: Diagnosis not present

## 2018-12-02 DIAGNOSIS — Z510 Encounter for antineoplastic radiation therapy: Secondary | ICD-10-CM | POA: Diagnosis not present

## 2018-12-02 DIAGNOSIS — Z86718 Personal history of other venous thrombosis and embolism: Secondary | ICD-10-CM | POA: Diagnosis not present

## 2018-12-02 MED FILL — DEXAMETHASONE 4 MG TABLET: 4 | 7 days supply | Qty: 30 | Fill #0

## 2018-12-02 NOTE — Telephone Encounter (Signed)
Nutrition Follow-up:  RD working remotely.  Patient with squamous cell carcinoma of left tonsil, p 16 +.  Patient is receiving chemotherapy and radiation therapy.  Patient followed by Dr. Maylon Peppers.    Patient returned RD's call.  Patient reports that he is eating but having taste alterations and mild sore throat.  Reports that he is drinking 2% milk and water. Has eaten fettucine alfredo with chicken and salad yesterday, with cookies yesterday.  Reports has tolerated pizza recently as well.  Not forth coming with typical day's intake.  Reports mild sore throat.    No feeding tube currently.     Medications: liquid morphine, diflucan, lidocaine, ativan, zofran, KCL, compazine, carafate  Labs: reviewed  Anthropometrics:   Weight 247 lb 4.8 oz on 5/13 decreased from 253 lb 4 oz on 4/29  Patient reports that he has been walking more and lifting weights recently which he thinks has contributed to weight loss  NUTRITION DIAGNOSIS: Predicted suboptimal energy intake continues as treatment progresses   INTERVENTION:  Discussed importance of good nutrition during treatment and weight maintenance.   Discussed 350 calorie oral nutrition shakes at least 3 times per day in addition to trying to eat soft foods. Examples of products discussed.  Discussed adding ice cream to shakes or frozen fruit and yogurt or peanut butter for additional calories and protein.   Reviewed soft protein foods and ways to increase calories and protein. Patient has contact information    MONITORING, EVALUATION, GOAL: Patient will tolerate increased calories and protein to minimize weight loss throughout treatment and promote healing   NEXT VISIT: phone follow-up on Thursday, May 21 with Pamala Hurry.  Informed patient of this phone call  Tee Richeson B. Zenia Resides, [place], [place] Registered Dietitian [phone] (pager)

## 2018-12-02 NOTE — Telephone Encounter (Signed)
Nutrition  RD working remotely.  Called patient for nutrition follow-up as no return call from patient yesterday (5/14) from messages left by Ernestene Kiel.  No answer.  Left message with call back number.    Chrisanne Loose B. Zenia Resides, [place], [place] Registered Dietitian [phone] (pager)

## 2018-12-05 ENCOUNTER — Ambulatory Visit
Admission: RE | Admit: 2018-12-05 | Discharge: 2018-12-05 | Disposition: A | Discharge status: Home / Self Care | Payer: Medicaid Other | Source: Ambulatory Visit | Attending: Radiation Oncology | Admitting: Radiation Oncology

## 2018-12-05 ENCOUNTER — Other Ambulatory Visit: Payer: Self-pay

## 2018-12-05 DIAGNOSIS — C770 Secondary and unspecified malignant neoplasm of lymph nodes of head, face and neck: Secondary | ICD-10-CM | POA: Diagnosis not present

## 2018-12-05 DIAGNOSIS — I10 Essential (primary) hypertension: Secondary | ICD-10-CM | POA: Diagnosis not present

## 2018-12-05 DIAGNOSIS — C090 Malignant neoplasm of tonsillar fossa: Secondary | ICD-10-CM | POA: Diagnosis not present

## 2018-12-05 DIAGNOSIS — Z86718 Personal history of other venous thrombosis and embolism: Secondary | ICD-10-CM | POA: Diagnosis not present

## 2018-12-05 DIAGNOSIS — D6859 Other primary thrombophilia: Secondary | ICD-10-CM | POA: Diagnosis not present

## 2018-12-05 DIAGNOSIS — Z510 Encounter for antineoplastic radiation therapy: Secondary | ICD-10-CM | POA: Diagnosis not present

## 2018-12-05 DIAGNOSIS — Z87891 Personal history of nicotine dependence: Secondary | ICD-10-CM | POA: Diagnosis not present

## 2018-12-05 DIAGNOSIS — Z79899 Other long term (current) drug therapy: Secondary | ICD-10-CM | POA: Diagnosis not present

## 2018-12-05 DIAGNOSIS — Z7901 Long term (current) use of anticoagulants: Secondary | ICD-10-CM | POA: Diagnosis not present

## 2018-12-05 DIAGNOSIS — Z86711 Personal history of pulmonary embolism: Secondary | ICD-10-CM | POA: Diagnosis not present

## 2018-12-05 MED ORDER — SONAFINE EX EMUL
1.0000 "application " | Freq: Once | CUTANEOUS | Status: AC
  Administered 2018-12-05: 1 via TOPICAL

## 2018-12-05 NOTE — Progress Notes (Signed)
[facility] OFFICE PROGRESS NOTE  Patient Care Team: Patient, No Pcp Per as PCP - General (General Practice) Annia Belt, MD as Consulting Physician (Oncology)  HEME/ONC OVERVIEW: 1. 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 Melene Plan, invasive SCCa, p16+; not a candidate for TORS per ENT  -Late 10/2018 - present: definitive chemoradiation with weekly cisplatin   2. 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 Port placed in 10/2018; PEG not yet scheduled   TREATMENT REGIMEN:  Early 2000 - present: warfarin, questionable compliance   11/08/2018 - present: definitive chemoradiation with weekly cisplatin   PERTINENT NON-HEM/ONC PROBLEMS: 1. Tobacco and EtOH abuse 2. Paroxysmal A-fib 3. 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, p16+  -S/p 3 doses of weekly cisplatin concurrent with RT in the definitive setting -Plt count 65k, worse than last week; patient has also developed progressive leukopenia  -No chemotherapy this week due to worsening thrombocytopenia  -Given the prolonged, worsening thrombocytopenia, I will reduce the cisplatin dose from 40 to 30mg /m2 with subsequent treatments  -PRN  anti-emetics: Zofran, Compazine, Ativan and dexamethasone  Chemotherapy-associated thrombocytopenia -Secondary to chemotherapy -Plts 65k today, worse than last week  -Patient denies any symptoms of bleeding or excess bruising, such as epistaxis, hematochezia, melena, or hematuria -Hold chemotherapy this week and dose reduction of cisplatin as outlined above   Chemotherapy-associated leukopenia -Secondary to chemotherapy -WBC 2.0k with ANC 1400, worse than last week  -Patient denies any symptoms of infection -Cisplatin dose reduction as above   Hypomagnesemia -Mg 1.6 today -Patient was prescribed oral magnesium oxide, but has not been able to take it consistently due to mucositis -I have ordered IV Mg sulfate 4gm for 12/08/2018  -Continue oral magnesium as tolerated  Chemotherapy-associated mucositis -Secondary to chemotherapy -Currently on liquid IR morphine 10mg /0.40mL q4hrs PRN -Pain tolerable but not well controlled  -Patient was counseled on avoiding NSAIDs due to this potential nephrotoxicity and the risk of bleeding; in addition, patient was counseled on importance of avoiding concurrent use of alcohol and opioid pain medication due to the risk of respiratory complications -If the pain is not well controlled with morphine 10mg /0.69mL, okay to increase to 20mg /31mL and see if it helps with the pain -Patient is instructed to call the clinic if he develops any recurrent light-headedness with morphine, and we can consider changing it to a different medication, such as oxycodone   Borderline hypotension -Patient reports one episode of systolic BP in the 13'K this week with some light-headedness -BP 108/87 today -Given the borderline low BP and recent light-headedness, I instructed the patient to hold his amlodipine and check his BP twice a day -If he develops recurrent HTN, we can consider resuming his amlodipine at a lower dose  -  We will also administer 1L NS on 12/08/2018   Hx of  recurrent DVT's and PTE -Patient is currently on warfarin, INR 1.8 today -He has been approved for Eliquis by [employer] and the prescription should be mailed to him in the next few days -I instructed the patient not to start Eliquis until we repeat his INR and ensure that it is less than 2 before switching  Oral candidiasis -Currently on p.o. fluconazole since 11/30/2018; plan for 3 weeks -Warfarin dose reduced by half due to the interaction between warfarin and fluconazole   EtOH abuse -Patient has reduced his EtOH use significantly since starting treatment -I have counseled the patient regarding the importance of minimizing his alcohol use during each visit, especially given the concurrent use with opioid pain medication for mucositis secondary to chemoradiation  No orders of the defined types were placed in this encounter.  All questions were answered. The patient knows to call the clinic with any problems, questions or concerns. No barriers to learning was detected.  Return in 1 week for port flush, labs, clinic appointment, and dose #4 of cisplatin.  Tish Men, MD 12/07/2018 4:19 PM  CHIEF COMPLAINT: "I felt a little foggy on Monday"  INTERVAL HISTORY: Mr. Alewine returns to clinic for follow-up of squamous cell carcinoma of the tonsil on definitive chemoradiation.  Patient reports that he tried IR liquid morphine a couple of times last week for the throat pain, but felt that it did not help with his pain significantly.  He tried it again on Monday morning, and a few hours later, he felt "foggy in the head".  By the time he presented to radiation oncology clinic, his blood pressure systolic was in the low 16X.  He is currently on amlodipine 10 mg daily, but he skipped a couple of the doses this week due to borderline low BP.  He reports that he may have been eating a little less over the past week due to decreased taste, but he has been able to drink approximately half a gallon  of water as well as some milk per day.  His weight has remained overall stable.  He denies any other complaint today.  SUMMARY OF ONCOLOGIC HISTORY:   Malignant neoplasm of tonsillar fossa ([place])   10/10/2018 Imaging    CT neck w/ contrast: FINDINGS: Pharynx and larynx: There is asymmetric prominence of the left tonsillar region. No other mucosal or submucosal lesion is seen.  Salivary glands: Parotid and submandibular glands are normal.  Thyroid: Normal  Lymph nodes: Definite adenopathy on the left. 2 level nodes which shows some internal necrosis. The larger measures 2.2 cm in diameter and the smaller measures 1.4 cm in diameter. Suspicious level 4 node image 83 just anterior to the jugular vein measuring 9 mm short axis diameter. No worrisome nodes on the right.    10/10/2018 Imaging    CT chest:  IMPRESSION: 1. Mild right hilar adenopathy, equivocal for metastasis. No additional potential evidence of metastatic disease in the chest. PET-CT could be considered for further staging evaluation, as clinically warranted. 2. 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 Ascending thoracic aortic aneurysm. Recommend semi-annual imaging followup by CTA or MRA and referral to cardiothoracic surgery if not already obtained. This recommendation follows 2010 ACCF/AHA/AATS/ACR/ASA/SCA/SCAI/SIR/STS/SVM Guidelines for the Diagnosis and Management of Patients With Thoracic Aortic Disease. Circulation. 2010; 121: W960-A540. Aortic aneurysm NOS (ICD10-I71.9).    10/17/2018 Initial Diagnosis    Tonsil cancer ([place])    10/18/2018 Imaging  PET: IMPRESSION: 1. 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 2. Local hypermetabolic nodal metastasis to the LEFT level II nodal station. 3. No contralateral nodal metastasis. 4. No evidence distant metastatic disease. 5. Two small pulmonary nodules in the LEFT lower lobe are favored benign. Recommend attention on  follow-up. 6. Several small common iliac lymph nodes with low metabolic activity. Favor reactive adenopathy.    10/21/2018 Cancer Staging    Staging form: Pharynx - HPV-Mediated Oropharynx, AJCC 8th Edition - Clinical: Stage I (cT2, cN1, cM0, p16+) - Signed by Eppie Gibson, MD on 10/21/2018    11/10/2018 -  Chemotherapy    The patient had palonosetron (ALOXI) injection 0.25 mg, 0.25 mg, Intravenous,  Once, 3 of 7 cycles Administration: 0.25 mg (11/10/2018), 0.25 mg (11/17/2018), 0.25 mg (11/24/2018) CISplatin (PLATINOL) 100 mg in sodium chloride 0.9 % 500 mL chemo infusion, 40 mg/m2 = 100 mg, Intravenous,  Once, 3 of 7 cycles Administration: 100 mg (11/10/2018), 100 mg (11/17/2018), 100 mg (11/24/2018) fosaprepitant (EMEND) 150 mg, dexamethasone (DECADRON) 12 mg in sodium chloride 0.9 % 145 mL IVPB, , Intravenous,  Once, 3 of 7 cycles Administration:  (11/10/2018),  (11/17/2018),  (11/24/2018)  for chemotherapy treatment.      REVIEW OF SYSTEMS:   Constitutional: ( - ) fevers, ( - )  chills , ( - ) night sweats Eyes: ( - ) blurriness of vision, ( - ) double vision, ( - ) watery eyes Ears, nose, mouth, throat, and face: ( + ) mucositis, ( + ) sore throat Respiratory: ( - ) cough, ( - ) dyspnea, ( - ) wheezes Cardiovascular: ( - ) palpitation, ( - ) chest discomfort, ( - ) lower extremity swelling Gastrointestinal:  ( - ) nausea, ( - ) heartburn, ( - ) change in bowel habits Skin: ( - ) abnormal skin rashes Lymphatics: ( - ) new lymphadenopathy, ( - ) easy bruising Neurological: ( - ) numbness, ( - ) tingling, ( - ) new weaknesses Behavioral/Psych: ( - ) mood change, ( - ) new changes  All other systems were reviewed with the patient and are negative.  I have reviewed the past medical history, past surgical history, social history and family history with the patient and they are unchanged from previous note.  ALLERGIES:  has No Known Allergies.  MEDICATIONS:  Current Outpatient Medications   Medication Sig Dispense Refill  . amLODipine (NORVASC) 10 MG tablet TAKE 1 TABLET BY MOUTH EVERY DAY 90 tablet 2  . fluconazole (DIFLUCAN) 100 MG tablet Take 2 tabs on the first day, and then 1 tab daily until finished 22 tablet 0  . Fluticasone Propionate 0.05 % LOTN Apply 1 application topically 2 (two) times daily. Apply to rash twice daily as needed 120 mL 1  . lidocaine (XYLOCAINE) 2 % solution Patient: Mix 1part 2% viscous lidocaine, 1part H20. Swish & swallow 7mL of diluted mixture, 52min before meals and at bedtime, up to QID 100 mL 5  . lidocaine-prilocaine (EMLA) cream Apply to affected area once 30 g 3  . magnesium oxide (MAG-OX) 400 (241.3 Mg) MG tablet Take 1 tablet (400 mg total) by mouth 2 (two) times daily for 30 days. 60 tablet 3  . Morphine Sulfate (MORPHINE CONCENTRATE) 10 mg / 0.5 ml concentrated solution Take 0.5 mLs (10 mg total) by mouth every 6 (six) hours as needed for up to 30 days for severe pain. 60 mL 0  . potassium chloride SA (K-DUR) 20 MEQ tablet  Take 1 tablet (20 mEq total) by mouth daily for 30 days. 30 tablet 3  . sodium fluoride (PREVIDENT 5000 PLUS) 1.1 % CREA dental cream Apply to tooth brush. Brush teeth for 2 minutes. Spit out excess. DO NOT rinse afterwards. Repeat nightly. 1 Tube prn  . sucralfate (CARAFATE) 1 g tablet Dissolve 1 tablet in 10 mL H20 and swallow prn soreness, up to QID. 40 tablet 5  . warfarin (COUMADIN) 10 MG tablet Take 5 mg by mouth daily at 6 PM.     . dexamethasone (DECADRON) 4 MG tablet Take 2 tablets by mouth once a day on the day after chemotherapy and then take 2 tablets two times a day for 2 days. Take with food. (Patient not taking: Reported on 11/30/2018) 30 tablet 1  . LORazepam (ATIVAN) 0.5 MG tablet Take 1 tablet (0.5 mg total) by mouth every 6 (six) hours as needed (Nausea or vomiting). (Patient not taking: Reported on 11/30/2018) 30 tablet 0  . ondansetron (ZOFRAN) 8 MG tablet Take 1 tablet (8 mg total) by mouth 2 (two) times  daily as needed. Start on the third day after chemotherapy. (Patient not taking: Reported on 12/07/2018) 30 tablet 1  . oxyCODONE-acetaminophen (PERCOCET) 5-325 MG tablet Take 1 tablet by mouth every 4 (four) hours as needed for severe pain. (Patient not taking: Reported on 12/07/2018) 25 tablet 0  . prochlorperazine (COMPAZINE) 10 MG tablet Take 1 tablet (10 mg total) by mouth every 6 (six) hours as needed (Nausea or vomiting). (Patient not taking: Reported on 11/30/2018) 30 tablet 1   No current facility-administered medications for this visit.    Facility-Administered Medications Ordered in Other Visits  Medication Dose Route Frequency Provider Last Rate Last Dose  . heparin lock flush 100 unit/mL  500 Units Intravenous Once Tish Men, MD      . sodium chloride flush (NS) 0.9 % injection 10 mL  10 mL Intravenous PRN Tish Men, MD        PHYSICAL EXAMINATION: ECOG PERFORMANCE STATUS: 1 - Symptomatic but completely ambulatory  Today's Vitals   12/07/18 1537 12/07/18 1544  BP: 105/87   Pulse: 98   Resp: 18   Temp: 98.2 F (36.8 C)   TempSrc: Oral   SpO2: 98%   Weight: 248 lb 3.2 oz (112.6 kg)   Height: 6\' 3"  (1.905 m)   PainSc:  3    Body mass index is 31.02 kg/m.  Filed Weights   12/07/18 1537  Weight: 248 lb 3.2 oz (112.6 kg)    GENERAL: alert, no distress and comfortable SKIN: Mild, formed dark erythema around the neck area from radiation  EYES: conjunctiva are pink and non-injected, sclera clear OROPHARYNX: Grade 2 confluent mucositis in the left oropharynx NECK: supple, non-tender LUNGS: clear to auscultation with normal breathing effort HEART: regular rate & rhythm and no murmurs and no lower extremity edema ABDOMEN: soft, non-tender, non-distended, normal bowel sounds Musculoskeletal: no cyanosis of digits and no clubbing  PSYCH: alert & oriented x 3, fluent speech NEURO: no focal motor/sensory deficits  LABORATORY DATA:  I have reviewed the data as listed     Component Value Date/Time   NA 140 12/07/2018 1453   NA 142 10/03/2018 1450   NA 141 05/21/2014 1226   K 4.5 12/07/2018 1453   K 4.3 05/21/2014 1226   CL 102 12/07/2018 1453   CL 108 (H) 12/28/2012 1405   CO2 29 12/07/2018 1453   CO2 28 05/21/2014 1226   GLUCOSE  91 12/07/2018 1453   GLUCOSE 101 05/21/2014 1226   GLUCOSE 95 12/28/2012 1405   BUN 11 12/07/2018 1453   BUN 15 10/03/2018 1450   BUN 13.2 05/21/2014 1226   CREATININE 0.82 12/07/2018 1453   CREATININE 1.00 09/24/2014 1413   CREATININE 0.9 05/21/2014 1226   CALCIUM 9.6 12/07/2018 1453   CALCIUM 9.3 05/21/2014 1226   PROT 7.4 10/21/2018 1148   PROT 7.2 10/03/2018 1450   PROT 7.1 01/08/2014 1203   ALBUMIN 4.4 10/21/2018 1148   ALBUMIN 4.3 10/03/2018 1450   ALBUMIN 3.7 01/08/2014 1203   AST 20 10/21/2018 1148   AST 50 (H) 01/08/2014 1203   ALT 19 10/21/2018 1148   ALT 83 (H) 01/08/2014 1203   ALKPHOS 73 10/21/2018 1148   ALKPHOS 87 01/08/2014 1203   BILITOT 0.6 10/21/2018 1148   BILITOT 0.67 01/08/2014 1203   GFRNONAA >60 12/07/2018 1453   GFRNONAA >60 10/15/2011 0224   GFRAA >60 12/07/2018 1453   GFRAA >60 10/15/2011 0224    No results found for: SPEP, UPEP  Lab Results  Component Value Date   WBC 2.0 (L) 12/07/2018   NEUTROABS 1.4 (L) 12/07/2018   HGB 15.4 12/07/2018   HCT 45.3 12/07/2018   MCV 94.6 12/07/2018   PLT 65 (L) 12/07/2018      Chemistry      Component Value Date/Time   NA 140 12/07/2018 1453   NA 142 10/03/2018 1450   NA 141 05/21/2014 1226   K 4.5 12/07/2018 1453   K 4.3 05/21/2014 1226   CL 102 12/07/2018 1453   CL 108 (H) 12/28/2012 1405   CO2 29 12/07/2018 1453   CO2 28 05/21/2014 1226   BUN 11 12/07/2018 1453   BUN 15 10/03/2018 1450   BUN 13.2 05/21/2014 1226   CREATININE 0.82 12/07/2018 1453   CREATININE 1.00 09/24/2014 1413   CREATININE 0.9 05/21/2014 1226      Component Value Date/Time   CALCIUM 9.6 12/07/2018 1453   CALCIUM 9.3 05/21/2014 1226   ALKPHOS 73  10/21/2018 1148   ALKPHOS 87 01/08/2014 1203   AST 20 10/21/2018 1148   AST 50 (H) 01/08/2014 1203   ALT 19 10/21/2018 1148   ALT 83 (H) 01/08/2014 1203   BILITOT 0.6 10/21/2018 1148   BILITOT 0.67 01/08/2014 1203

## 2018-12-06 ENCOUNTER — Other Ambulatory Visit: Payer: Self-pay

## 2018-12-06 ENCOUNTER — Ambulatory Visit
Admission: RE | Admit: 2018-12-06 | Discharge: 2018-12-06 | Disposition: A | Discharge status: Home / Self Care | Payer: Medicaid Other | Source: Ambulatory Visit | Attending: Radiation Oncology | Admitting: Radiation Oncology

## 2018-12-06 ENCOUNTER — Other Ambulatory Visit: Payer: Self-pay | Admitting: Oncology

## 2018-12-06 DIAGNOSIS — Z7901 Long term (current) use of anticoagulants: Secondary | ICD-10-CM | POA: Diagnosis not present

## 2018-12-06 DIAGNOSIS — C090 Malignant neoplasm of tonsillar fossa: Secondary | ICD-10-CM | POA: Diagnosis not present

## 2018-12-06 DIAGNOSIS — Z86711 Personal history of pulmonary embolism: Secondary | ICD-10-CM | POA: Diagnosis not present

## 2018-12-06 DIAGNOSIS — C770 Secondary and unspecified malignant neoplasm of lymph nodes of head, face and neck: Secondary | ICD-10-CM | POA: Diagnosis not present

## 2018-12-06 DIAGNOSIS — Z87891 Personal history of nicotine dependence: Secondary | ICD-10-CM | POA: Diagnosis not present

## 2018-12-06 DIAGNOSIS — Z510 Encounter for antineoplastic radiation therapy: Secondary | ICD-10-CM | POA: Diagnosis not present

## 2018-12-06 DIAGNOSIS — D6859 Other primary thrombophilia: Secondary | ICD-10-CM | POA: Diagnosis not present

## 2018-12-06 DIAGNOSIS — I10 Essential (primary) hypertension: Secondary | ICD-10-CM | POA: Diagnosis not present

## 2018-12-06 DIAGNOSIS — Z79899 Other long term (current) drug therapy: Secondary | ICD-10-CM | POA: Diagnosis not present

## 2018-12-06 DIAGNOSIS — Z86718 Personal history of other venous thrombosis and embolism: Secondary | ICD-10-CM | POA: Diagnosis not present

## 2018-12-07 ENCOUNTER — Other Ambulatory Visit: Payer: Self-pay

## 2018-12-07 ENCOUNTER — Encounter: Payer: Self-pay | Admitting: Hematology

## 2018-12-07 ENCOUNTER — Inpatient Hospital Stay (HOSPITAL_BASED_OUTPATIENT_CLINIC_OR_DEPARTMENT_OTHER): Payer: Medicaid Other | Admitting: Hematology

## 2018-12-07 ENCOUNTER — Ambulatory Visit
Admission: RE | Admit: 2018-12-07 | Discharge: 2018-12-07 | Disposition: A | Discharge status: Home / Self Care | Payer: Medicaid Other | Source: Ambulatory Visit | Attending: Radiation Oncology | Admitting: Radiation Oncology

## 2018-12-07 ENCOUNTER — Inpatient Hospital Stay: Payer: Medicaid Other

## 2018-12-07 ENCOUNTER — Encounter: Payer: Self-pay | Admitting: Nurse Practitioner

## 2018-12-07 VITALS — BP 105/87 | HR 98 | Temp 98.2°F | Resp 18 | Ht 75.0 in | Wt 248.2 lb

## 2018-12-07 DIAGNOSIS — Z5111 Encounter for antineoplastic chemotherapy: Secondary | ICD-10-CM | POA: Diagnosis not present

## 2018-12-07 DIAGNOSIS — D6959 Other secondary thrombocytopenia: Secondary | ICD-10-CM | POA: Diagnosis not present

## 2018-12-07 DIAGNOSIS — Z86711 Personal history of pulmonary embolism: Secondary | ICD-10-CM | POA: Diagnosis not present

## 2018-12-07 DIAGNOSIS — Z510 Encounter for antineoplastic radiation therapy: Secondary | ICD-10-CM | POA: Diagnosis not present

## 2018-12-07 DIAGNOSIS — Z95828 Presence of other vascular implants and grafts: Secondary | ICD-10-CM

## 2018-12-07 DIAGNOSIS — C770 Secondary and unspecified malignant neoplasm of lymph nodes of head, face and neck: Secondary | ICD-10-CM | POA: Diagnosis not present

## 2018-12-07 DIAGNOSIS — I825Y1 Chronic embolism and thrombosis of unspecified deep veins of right proximal lower extremity: Secondary | ICD-10-CM

## 2018-12-07 DIAGNOSIS — Z7901 Long term (current) use of anticoagulants: Secondary | ICD-10-CM | POA: Diagnosis not present

## 2018-12-07 DIAGNOSIS — T451X5A Adverse effect of antineoplastic and immunosuppressive drugs, initial encounter: Secondary | ICD-10-CM

## 2018-12-07 DIAGNOSIS — D6859 Other primary thrombophilia: Secondary | ICD-10-CM | POA: Diagnosis not present

## 2018-12-07 DIAGNOSIS — C090 Malignant neoplasm of tonsillar fossa: Secondary | ICD-10-CM

## 2018-12-07 DIAGNOSIS — D701 Agranulocytosis secondary to cancer chemotherapy: Secondary | ICD-10-CM

## 2018-12-07 DIAGNOSIS — Z87891 Personal history of nicotine dependence: Secondary | ICD-10-CM | POA: Diagnosis not present

## 2018-12-07 DIAGNOSIS — I2609 Other pulmonary embolism with acute cor pulmonale: Secondary | ICD-10-CM

## 2018-12-07 DIAGNOSIS — F1010 Alcohol abuse, uncomplicated: Secondary | ICD-10-CM

## 2018-12-07 DIAGNOSIS — Z79899 Other long term (current) drug therapy: Secondary | ICD-10-CM | POA: Diagnosis not present

## 2018-12-07 DIAGNOSIS — B370 Candidal stomatitis: Secondary | ICD-10-CM

## 2018-12-07 DIAGNOSIS — I10 Essential (primary) hypertension: Secondary | ICD-10-CM | POA: Diagnosis not present

## 2018-12-07 DIAGNOSIS — K1231 Oral mucositis (ulcerative) due to antineoplastic therapy: Secondary | ICD-10-CM

## 2018-12-07 DIAGNOSIS — Z86718 Personal history of other venous thrombosis and embolism: Secondary | ICD-10-CM | POA: Diagnosis not present

## 2018-12-07 LAB — BASIC METABOLIC PANEL - CANCER CENTER ONLY
Anion gap: 9 (ref 5–15)
BUN: 11 mg/dL (ref 6–20)
CO2: 29 mmol/L (ref 22–32)
Calcium: 9.6 mg/dL (ref 8.9–10.3)
Chloride: 102 mmol/L (ref 98–111)
Creatinine: 0.82 mg/dL (ref 0.61–1.24)
GFR, Est AFR Am: >60 mL/min (ref >60)
GFR, Estimated: >60 mL/min (ref >60)
Glucose, Bld: 91 mg/dL (ref 70–99)
Potassium: 4.5 mmol/L (ref 3.5–5.1)
Sodium: 140 mmol/L (ref 135–145)

## 2018-12-07 LAB — CBC WITH DIFFERENTIAL (CANCER CENTER ONLY)
Abs Immature Granulocytes: 0.01 10*3/uL (ref 0.00–0.07)
Basophils Absolute: 0 10*3/uL (ref 0.0–0.1)
Basophils Relative: 1 %
Eosinophils Absolute: 0.1 10*3/uL (ref 0.0–0.5)
Eosinophils Relative: 4 %
HCT: 45.3 % (ref 39.0–52.0)
Hemoglobin: 15.4 g/dL (ref 13.0–17.0)
Immature Granulocytes: 1 %
Lymphocytes Relative: 14 %
Lymphs Abs: 0.3 10*3/uL — ABNORMAL LOW (ref 0.7–4.0)
MCH: 32.2 pg (ref 26.0–34.0)
MCHC: 34 g/dL (ref 30.0–36.0)
MCV: 94.6 fL (ref 80.0–100.0)
Monocytes Absolute: 0.3 10*3/uL (ref 0.1–1.0)
Monocytes Relative: 12 %
Neutro Abs: 1.4 10*3/uL — ABNORMAL LOW (ref 1.7–7.7)
Neutrophils Relative %: 68 %
Platelet Count: 65 10*3/uL — ABNORMAL LOW (ref 150–400)
RBC: 4.79 MIL/uL (ref 4.22–5.81)
RDW: 13.8 % (ref 11.5–15.5)
WBC Count: 2 10*3/uL — ABNORMAL LOW (ref 4.0–10.5)
nRBC: 0 % (ref 0.0–0.2)

## 2018-12-07 LAB — MAGNESIUM: Magnesium: 1.6 mg/dL — ABNORMAL LOW (ref 1.7–2.4)

## 2018-12-07 LAB — PROTIME-INR
INR: 1.8 — ABNORMAL HIGH (ref 0.8–1.2)
Prothrombin Time: 20.2 seconds — ABNORMAL HIGH (ref 11.4–15.2)

## 2018-12-07 MED ORDER — SODIUM CHLORIDE 0.9% FLUSH
10.0000 mL | INTRAVENOUS | Status: DC | PRN
  Administered 2018-12-07: 10 mL via INTRAVENOUS
  Filled 2018-12-07: qty 10

## 2018-12-07 MED ORDER — HEPARIN SOD (PORK) LOCK FLUSH 100 UNIT/ML IV SOLN
500.0000 [IU] | Freq: Once | INTRAVENOUS | Status: AC
  Administered 2018-12-07: 500 [IU] via INTRAVENOUS
  Filled 2018-12-07: qty 5

## 2018-12-07 NOTE — Progress Notes (Signed)
Pt had labs draws from arm in lab. Flush complete with blood return noted with no issues.

## 2018-12-07 NOTE — Progress Notes (Signed)
Rcvd approval letter from [employer].  Pt is eligible to receive Eliquis free of charge from 11/30/18 through 11/30/19.  Left a vm for pt informing him of the approval.

## 2018-12-08 ENCOUNTER — Telehealth: Payer: Self-pay | Admitting: Hematology

## 2018-12-08 ENCOUNTER — Other Ambulatory Visit: Payer: Self-pay

## 2018-12-08 ENCOUNTER — Inpatient Hospital Stay: Payer: Medicaid Other

## 2018-12-08 ENCOUNTER — Inpatient Hospital Stay: Payer: Medicaid Other | Admitting: Nutrition

## 2018-12-08 ENCOUNTER — Ambulatory Visit: Payer: Self-pay

## 2018-12-08 ENCOUNTER — Ambulatory Visit
Admission: RE | Admit: 2018-12-08 | Discharge: 2018-12-08 | Disposition: A | Discharge status: Home / Self Care | Payer: Medicaid Other | Source: Ambulatory Visit | Attending: Radiation Oncology | Admitting: Radiation Oncology

## 2018-12-08 VITALS — BP 120/84 | HR 58 | Temp 97.8°F | Resp 18

## 2018-12-08 DIAGNOSIS — Z510 Encounter for antineoplastic radiation therapy: Secondary | ICD-10-CM | POA: Diagnosis not present

## 2018-12-08 DIAGNOSIS — I10 Essential (primary) hypertension: Secondary | ICD-10-CM | POA: Diagnosis not present

## 2018-12-08 DIAGNOSIS — Z7901 Long term (current) use of anticoagulants: Secondary | ICD-10-CM | POA: Diagnosis not present

## 2018-12-08 DIAGNOSIS — C090 Malignant neoplasm of tonsillar fossa: Secondary | ICD-10-CM

## 2018-12-08 DIAGNOSIS — Z79899 Other long term (current) drug therapy: Secondary | ICD-10-CM | POA: Diagnosis not present

## 2018-12-08 DIAGNOSIS — Z86718 Personal history of other venous thrombosis and embolism: Secondary | ICD-10-CM | POA: Diagnosis not present

## 2018-12-08 DIAGNOSIS — Z86711 Personal history of pulmonary embolism: Secondary | ICD-10-CM | POA: Diagnosis not present

## 2018-12-08 DIAGNOSIS — D6859 Other primary thrombophilia: Secondary | ICD-10-CM | POA: Diagnosis not present

## 2018-12-08 DIAGNOSIS — Z87891 Personal history of nicotine dependence: Secondary | ICD-10-CM | POA: Diagnosis not present

## 2018-12-08 DIAGNOSIS — Z5111 Encounter for antineoplastic chemotherapy: Secondary | ICD-10-CM | POA: Diagnosis not present

## 2018-12-08 DIAGNOSIS — C770 Secondary and unspecified malignant neoplasm of lymph nodes of head, face and neck: Secondary | ICD-10-CM | POA: Diagnosis not present

## 2018-12-08 MED ORDER — SODIUM CHLORIDE 0.9 % IV SOLN
Freq: Once | INTRAVENOUS | Status: AC
  Administered 2018-12-08: 13:00:00 via INTRAVENOUS
  Filled 2018-12-08: qty 250

## 2018-12-08 MED ORDER — SODIUM CHLORIDE 0.9% FLUSH
10.0000 mL | Freq: Once | INTRAVENOUS | Status: AC
  Administered 2018-12-08: 16:00:00 10 mL via INTRAVENOUS
  Filled 2018-12-08: qty 10

## 2018-12-08 MED ORDER — SODIUM CHLORIDE 0.9 % IV SOLN
4.0000 g | Freq: Once | INTRAVENOUS | Status: AC
  Administered 2018-12-08: 13:00:00 4 g via INTRAVENOUS
  Filled 2018-12-08: qty 8

## 2018-12-08 MED ORDER — HEPARIN SOD (PORK) LOCK FLUSH 100 UNIT/ML IV SOLN
500.0000 [IU] | Freq: Once | INTRAVENOUS | Status: AC
  Administered 2018-12-08: 16:00:00 500 [IU] via INTRAVENOUS
  Filled 2018-12-08: qty 5

## 2018-12-08 NOTE — Progress Notes (Signed)
RD working remotely. Called patient 2 times but was unable to reach him. Left 2 messages with name and call back number. Asked him to leave me a message with how he is feeling.

## 2018-12-08 NOTE — Patient Instructions (Signed)
[facility] Discharge Instructions for Patients Receiving Chemotherapy  Today you received the following chemotherapy agents  To help prevent nausea and vomiting after your treatment, we encourage you to take your nausea medication as directed If you develop nausea and vomiting that is not controlled by your nausea medication, call the clinic.   BELOW ARE SYMPTOMS THAT SHOULD BE REPORTED IMMEDIATELY:  *FEVER GREATER THAN 100.5 F  *CHILLS WITH OR WITHOUT FEVER  NAUSEA AND VOMITING THAT IS NOT CONTROLLED WITH YOUR NAUSEA MEDICATION  *UNUSUAL SHORTNESS OF BREATH  *UNUSUAL BRUISING OR BLEEDING  TENDERNESS IN MOUTH AND THROAT WITH OR WITHOUT PRESENCE OF ULCERS  *URINARY PROBLEMS  *BOWEL PROBLEMS  UNUSUAL RASH Items with * indicate a potential emergency and should be followed up as soon as possible.  Feel free to call the clinic should you have any questions or concerns. The clinic phone number is [phone].  Please show the [facility] at check-in to the Emergency Department and triage nurse.   Hypomagnesemia Hypomagnesemia is a condition in which the level of magnesium in the blood is low. Magnesium is a mineral that is found in many foods. It is used in many different processes in the body. Hypomagnesemia can affect every organ in the body. In severe cases, it can cause life-threatening problems. What are the causes? This condition may be caused by:  Not getting enough magnesium in your diet.  Malnutrition.  Problems with absorbing magnesium from the intestines.  Dehydration.  Alcohol abuse.  Vomiting.  Severe or chronic diarrhea.  Some medicines, including medicines that make you urinate more (diuretics).  Certain diseases, such as kidney disease, diabetes, celiac disease, and overactive thyroid. What are the signs or symptoms? Symptoms of this condition include:  Loss of appetite.  Nausea and vomiting.  Involuntary shaking or  trembling of a body part (tremor).  Muscle weakness.  Tingling in the arms and legs.  Sudden tightening of muscles (muscle spasms).  Confusion.  Psychiatric issues, such as depression, irritability, or psychosis.  A feeling of fluttering of the heart.  Seizures. These symptoms are more severe if magnesium levels drop suddenly. How is this diagnosed? This condition may be diagnosed based on:  Your symptoms and medical history.  A physical exam.  Blood and urine tests. How is this treated? Treatment depends on the cause and the severity of the condition. It may be treated with:  A magnesium supplement. This can be taken in pill form. If the condition is severe, magnesium is usually given through an IV.  Changes to your diet. You may be directed to eat foods that have a lot of magnesium, such as green leafy vegetables, peas, beans, and nuts.  Stopping any intake of alcohol. Follow these instructions at home:      Make sure that your diet includes foods with magnesium. Foods that have a lot of magnesium in them include: ? Green leafy vegetables, such as spinach and broccoli. ? Beans and peas. ? Nuts and seeds, such as almonds and sunflower seeds. ? Whole grains, such as whole grain bread and fortified cereals.  Take magnesium supplements if your health care provider tells you to do that. Take them as directed.  Take over-the-counter and prescription medicines only as told by your health care provider.  Have your magnesium levels monitored as told by your health care provider.  When you are active, drink fluids that contain electrolytes.  Avoid drinking alcohol.  Keep all follow-up visits as told  by your health care provider. This is important. Contact a health care provider if:  You get worse instead of better.  Your symptoms return. Get help right away if you:  Develop severe muscle weakness.  Have trouble breathing.  Feel that your heart is racing.  Summary  Hypomagnesemia is a condition in which the level of magnesium in the blood is low.  Hypomagnesemia can affect every organ in the body.  Treatment may include eating more foods that contain magnesium, taking magnesium supplements, and not drinking alcohol.  Have your magnesium levels monitored as told by your health care provider. This information is not intended to replace advice given to you by your health care provider. Make sure you discuss any questions you have with your health care provider. Document Released: 04/01/2005 Document Revised: 06/07/2017 Document Reviewed: 06/07/2017 Elsevier Interactive Patient Education  2019 [employer].

## 2018-12-08 NOTE — Telephone Encounter (Signed)
Per 5/20 los No change in appts. °

## 2018-12-09 ENCOUNTER — Ambulatory Visit
Admission: RE | Admit: 2018-12-09 | Discharge: 2018-12-09 | Disposition: A | Discharge status: Home / Self Care | Payer: Medicaid Other | Source: Ambulatory Visit | Attending: Radiation Oncology | Admitting: Radiation Oncology

## 2018-12-09 ENCOUNTER — Telehealth: Payer: Self-pay | Admitting: *Deleted

## 2018-12-09 ENCOUNTER — Other Ambulatory Visit: Payer: Self-pay

## 2018-12-09 DIAGNOSIS — C770 Secondary and unspecified malignant neoplasm of lymph nodes of head, face and neck: Secondary | ICD-10-CM | POA: Diagnosis not present

## 2018-12-09 DIAGNOSIS — Z7901 Long term (current) use of anticoagulants: Secondary | ICD-10-CM | POA: Diagnosis not present

## 2018-12-09 DIAGNOSIS — Z87891 Personal history of nicotine dependence: Secondary | ICD-10-CM | POA: Diagnosis not present

## 2018-12-09 DIAGNOSIS — D6859 Other primary thrombophilia: Secondary | ICD-10-CM | POA: Diagnosis not present

## 2018-12-09 DIAGNOSIS — I10 Essential (primary) hypertension: Secondary | ICD-10-CM | POA: Diagnosis not present

## 2018-12-09 DIAGNOSIS — Z86718 Personal history of other venous thrombosis and embolism: Secondary | ICD-10-CM | POA: Diagnosis not present

## 2018-12-09 DIAGNOSIS — C090 Malignant neoplasm of tonsillar fossa: Secondary | ICD-10-CM | POA: Diagnosis not present

## 2018-12-09 DIAGNOSIS — Z79899 Other long term (current) drug therapy: Secondary | ICD-10-CM | POA: Diagnosis not present

## 2018-12-09 DIAGNOSIS — Z86711 Personal history of pulmonary embolism: Secondary | ICD-10-CM | POA: Diagnosis not present

## 2018-12-09 DIAGNOSIS — Z510 Encounter for antineoplastic radiation therapy: Secondary | ICD-10-CM | POA: Diagnosis not present

## 2018-12-10 NOTE — Telephone Encounter (Signed)
Oncology Nurse Navigator Documentation  Called Mr. Sliwa to check on his well-being, to check on scheduling of PEG placement with [facility].  LVMM, asked for call-back.  Gayleen Orem, RN, BSN Head & Neck Oncology Nurse [facility] [place] [phone]

## 2018-12-13 ENCOUNTER — Other Ambulatory Visit: Payer: Self-pay

## 2018-12-13 ENCOUNTER — Ambulatory Visit
Admission: RE | Admit: 2018-12-13 | Discharge: 2018-12-13 | Disposition: A | Discharge status: Home / Self Care | Payer: Medicaid Other | Source: Ambulatory Visit | Attending: Radiation Oncology | Admitting: Radiation Oncology

## 2018-12-13 DIAGNOSIS — Z86718 Personal history of other venous thrombosis and embolism: Secondary | ICD-10-CM | POA: Diagnosis not present

## 2018-12-13 DIAGNOSIS — D6859 Other primary thrombophilia: Secondary | ICD-10-CM | POA: Diagnosis not present

## 2018-12-13 DIAGNOSIS — I10 Essential (primary) hypertension: Secondary | ICD-10-CM | POA: Diagnosis not present

## 2018-12-13 DIAGNOSIS — C090 Malignant neoplasm of tonsillar fossa: Secondary | ICD-10-CM | POA: Diagnosis not present

## 2018-12-13 DIAGNOSIS — Z87891 Personal history of nicotine dependence: Secondary | ICD-10-CM | POA: Diagnosis not present

## 2018-12-13 DIAGNOSIS — Z7901 Long term (current) use of anticoagulants: Secondary | ICD-10-CM | POA: Diagnosis not present

## 2018-12-13 DIAGNOSIS — C770 Secondary and unspecified malignant neoplasm of lymph nodes of head, face and neck: Secondary | ICD-10-CM | POA: Diagnosis not present

## 2018-12-13 DIAGNOSIS — Z86711 Personal history of pulmonary embolism: Secondary | ICD-10-CM | POA: Diagnosis not present

## 2018-12-13 DIAGNOSIS — Z510 Encounter for antineoplastic radiation therapy: Secondary | ICD-10-CM | POA: Diagnosis not present

## 2018-12-13 DIAGNOSIS — Z79899 Other long term (current) drug therapy: Secondary | ICD-10-CM | POA: Diagnosis not present

## 2018-12-13 NOTE — Progress Notes (Signed)
[facility] OFFICE PROGRESS NOTE  Patient Care Team: Patient, No Pcp Per as PCP - General (General Practice) [name] Belt, MD as Consulting Physician (Oncology)  HEME/ONC OVERVIEW: 1. 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 Melene Plan, invasive SCCa, p16+; not a candidate for TORS per ENT  -Late 10/2018 - present: definitive chemoradiation with weekly cisplatin   2. 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 Port placed in 10/2018; PEG not yet scheduled   TREATMENT REGIMEN:  Early 2000 - present: warfarin, questionable compliance   11/08/2018 - present: definitive chemoradiation with weekly cisplatin   PERTINENT NON-HEM/ONC PROBLEMS: 1. Tobacco and EtOH abuse 2. Paroxysmal A-fib 3. 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, p16+  -S/p 3 cycles of weekly cisplatin concurrent with RT in the definitive setting -Chemotherapy has been on hold due to progressive leukopenia and worsening thrombocytopenia -Due to the cytopenias, I have reduced the cisplatin dose to 30mg /m2  -Labs adequate today, proceed with Dose #4 of chemotherapy -Feeding tube on hold, as the patient has been tolerating oral  intake reasonably well and not interested in pursuing feeding tube  -PRN anti-emetics: Zofran, Compazine, Ativan and dexamethasone  Left neck swelling -New; 1 week duration, fluctuating -Patient was instructed by radiation oncology to apply neosporin cream -Given the marked enlargement of the left neck, I have ordered STAT CT neck to assess for any abscess vs. lymphadenopathy  Chemotherapy-associated mucositis -Secondary to chemotherapy -Currently on liquid IR morphine 10mg /0.57mL q4hrs PRN with modest pain relief; he also has been taking ibuprofen 2 tabs at night, which he feels help with the pain -Patient was given instruction to increase it to 20mg /42mL q4hrs PRN if the pain was not well controlled -He was also concerned that morphine was causing light-headedness, but it was more likely due to dehydration  -I also cautioned the patient on NSAID use, which may mask fever, increase risk of renal injury and bleeding   Protein malnutrition -Secondary to chemotherapy vs. chemoradiation -Weight down by 5 lbs since the last visit  -He is currently eating food by mouth, as well as 1 Carn[employer] instant breakfast shake per day -I emphasized the importance of maintaining adequate oral intake, even if his taste is decreased, due to the lack of feeding tube -I instructed patient to increase [employer] Breakfast strength to at least 2 to 3/day, and to maintain adequate hydration  Borderline hypotension -Patient was given 1L NS last week and instructed to hold his amlodipine -BP 106/85 today -I counseled the patient on monitoring his BP closely at home, and to continue to hold his amlodipine for now unless his BP rebounds above 350-093'G systolics  Hx of recurrent DVT's and PTE -He has been on warfarin since  early 2000's but questionable compliance -He was approved for Eliquis via the patient assistance program, but has not yet received his Eliquis in the mail -INR 3.4 today -I have instructed  to him to skip his warfarin for the next two days, and resume at 5mg  qhs  -As the patient is currently followed by the Coumadin clinic, I have instructed him to follow up with the anticoagulation clinic this week for further management of wafarin  -Patient is instructed not to start Eliquis until INR can be rechecked and is < 2.0   Oral candidiasis -On fluconazole x 3 weeks  -Clinically, oral candidiasis is improving, but patient only takes the medication sporadically -I emphasized the importance of medication adherence  Orders Placed This Encounter  Procedures  . CT Soft Tissue Neck W Contrast    Standing Status:   Future    Standing Expiration Date:   12/14/2019    Order Specific Question:   ** REASON FOR EXAM (FREE TEXT)    Answer:   new left neck swelling, on chemoRT    Order Specific Question:   If indicated for the ordered procedure, I authorize the administration of contrast media per Radiology protocol    Answer:   Yes    Order Specific Question:   Preferred imaging location?    Answer:   [facility]    Order Specific Question:   Radiology Contrast Protocol - do NOT remove file path    Answer:   [email or web address]    All questions were answered. The patient knows to call the clinic with any problems, questions or concerns. No barriers to learning was detected.  Return in 1 week for labs, port flush, clinic appt and Dose #5 of chemotherapy.   [name] Men, MD 12/14/2018 4:15 PM  CHIEF COMPLAINT: "I am doing a little better"  INTERVAL HISTORY: [name] [name] returns to clinic for follow-up of squamous cell carcinoma of the left tonsil on definitive chemoradiation with weekly cisplatin.  Patient reports that since holding chemotherapy for the past 2 weeks, his swallowing function has gotten better, and he has had less pain with eating.  He tried liquid morphine a few times, which did not seem to help with the pain, but he also did not cause any  recurrent lightheadedness.  He has been taking 2 tabs ibuprofen 200 mg before dinner, which seem to control the throat pain.  He is able to eat most solid food, as well as drinking 1 Carn[employer] Instant Breakfast and 3-4 bottles of 16 ounce fluid (water and Gatorade) per day, but his oral intake has been limited due to lack of taste.  He is only taking his fluconazole sporadically because he forgets to take the medication.  He has been contacted by [employer] for his Eliquis prescription, but he has not yet received the medication in the mail.  He is currently taking warfarin, and denies any abnormal bleeding or bruising.  SUMMARY OF ONCOLOGIC HISTORY:   Malignant neoplasm of tonsillar fossa ([place])   10/10/2018 Imaging    CT neck w/ contrast: FINDINGS: Pharynx and larynx: There is asymmetric prominence of the left tonsillar region. No other mucosal or submucosal lesion is seen.  Salivary glands: Parotid and submandibular glands are normal.  Thyroid: Normal  Lymph nodes: Definite adenopathy on the left. 2 level nodes which shows some internal necrosis. The larger measures 2.2 cm in diameter and the smaller measures 1.4 cm in diameter. Suspicious level 4 node image 83 just anterior to the  jugular vein measuring 9 mm short axis diameter. No worrisome nodes on the right.    10/10/2018 Imaging    CT chest:  IMPRESSION: 1. Mild right hilar adenopathy, equivocal for metastasis. No additional potential evidence of metastatic disease in the chest. PET-CT could be considered for further staging evaluation, as clinically warranted. 2. 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 Ascending thoracic aortic aneurysm. Recommend semi-annual imaging followup by CTA or MRA and referral to cardiothoracic surgery if not already obtained. This recommendation follows 2010 ACCF/AHA/AATS/ACR/ASA/SCA/SCAI/SIR/STS/SVM Guidelines for the Diagnosis and Management of Patients With Thoracic Aortic  Disease. Circulation. 2010; 121: U235-T614. Aortic aneurysm NOS (ICD10-I71.9).    10/17/2018 Initial Diagnosis    Tonsil cancer ([place])    10/18/2018 Imaging    PET: IMPRESSION: 1. 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 2. Local hypermetabolic nodal metastasis to the LEFT level II nodal station. 3. No contralateral nodal metastasis. 4. No evidence distant metastatic disease. 5. Two small pulmonary nodules in the LEFT lower lobe are favored benign. Recommend attention on follow-up. 6. Several small common iliac lymph nodes with low metabolic activity. Favor reactive adenopathy.    10/21/2018 Cancer Staging    Staging form: Pharynx - HPV-Mediated Oropharynx, AJCC 8th Edition - Clinical: Stage I (cT2, cN1, cM0, p16+) - Signed by Eppie Gibson, MD on 10/21/2018    11/10/2018 -  Chemotherapy    The patient had palonosetron (ALOXI) injection 0.25 mg, 0.25 mg, Intravenous,  Once, 3 of 7 cycles Administration: 0.25 mg (11/10/2018), 0.25 mg (11/17/2018), 0.25 mg (11/24/2018) CISplatin (PLATINOL) 100 mg in sodium chloride 0.9 % 500 mL chemo infusion, 40 mg/m2 = 100 mg, Intravenous,  Once, 3 of 7 cycles Dose modification: 30 mg/m2 (original dose 40 mg/m2, Cycle 5, Reason: Dose not tolerated) Administration: 100 mg (11/10/2018), 100 mg (11/17/2018), 100 mg (11/24/2018) fosaprepitant (EMEND) 150 mg, dexamethasone (DECADRON) 12 mg in sodium chloride 0.9 % 145 mL IVPB, , Intravenous,  Once, 3 of 7 cycles Administration:  (11/10/2018),  (11/17/2018),  (11/24/2018)  for chemotherapy treatment.      REVIEW OF SYSTEMS:   Constitutional: ( - ) fevers, ( - )  chills , ( - ) night sweats Eyes: ( - ) blurriness of vision, ( - ) double vision, ( - ) watery eyes Ears, nose, mouth, throat, and face: ( + ) mucositis, ( + ) sore throat Respiratory: ( - ) cough, ( - ) dyspnea, ( - ) wheezes Cardiovascular: ( - ) palpitation, ( - ) chest discomfort, ( - ) lower extremity  swelling Gastrointestinal:  ( - ) nausea, ( - ) heartburn, ( - ) change in bowel habits Skin: ( - ) abnormal skin rashes Lymphatics: ( - ) new lymphadenopathy, ( - ) easy bruising Neurological: ( - ) numbness, ( - ) tingling, ( - ) new weaknesses Behavioral/Psych: ( - ) mood change, ( - ) new changes  All other systems were reviewed with the patient and are negative.  I have reviewed the past medical history, past surgical history, social history and family history with the patient and they are unchanged from previous note.  ALLERGIES:  has No Known Allergies.  MEDICATIONS:  Current Outpatient Medications  Medication Sig Dispense Refill  . amLODipine (NORVASC) 10 MG tablet TAKE 1 TABLET BY MOUTH EVERY DAY 90 tablet 2  . fluconazole (DIFLUCAN) 100 MG tablet Take 2 tabs on the first day, and then 1 tab daily until finished 22 tablet 0  . Fluticasone Propionate 0.05 %  LOTN Apply 1 application topically 2 (two) times daily. Apply to rash twice daily as needed 120 mL 1  . ibuprofen (ADVIL) 100 MG tablet Take 200 mg by mouth as needed for fever.    . lidocaine (XYLOCAINE) 2 % solution Patient: Mix 1part 2% viscous lidocaine, 1part H20. Swish & swallow 77mL of diluted mixture, 55min before meals and at bedtime, up to QID 100 mL 5  . lidocaine-prilocaine (EMLA) cream Apply to affected area once 30 g 3  . LORazepam (ATIVAN) 0.5 MG tablet Take 1 tablet (0.5 mg total) by mouth every 6 (six) hours as needed (Nausea or vomiting). 30 tablet 0  . magnesium oxide (MAG-OX) 400 (241.3 Mg) MG tablet Take 1 tablet (400 mg total) by mouth 2 (two) times daily for 30 days. 60 tablet 3  . Morphine Sulfate (MORPHINE CONCENTRATE) 10 mg / 0.5 ml concentrated solution Take 0.5 mLs (10 mg total) by mouth every 6 (six) hours as needed for up to 30 days for severe pain. 60 mL 0  . oxyCODONE-acetaminophen (PERCOCET) 5-325 MG tablet Take 1 tablet by mouth every 4 (four) hours as needed for severe pain. 25 tablet 0  .  potassium chloride SA (K-DUR) 20 MEQ tablet Take 1 tablet (20 mEq total) by mouth daily for 30 days. 30 tablet 3  . sodium fluoride (PREVIDENT 5000 PLUS) 1.1 % CREA dental cream Apply to tooth brush. Brush teeth for 2 minutes. Spit out excess. DO NOT rinse afterwards. Repeat nightly. 1 Tube prn  . warfarin (COUMADIN) 10 MG tablet Take 5 mg by mouth daily at 6 PM.     . dexamethasone (DECADRON) 4 MG tablet Take 2 tablets by mouth once a day on the day after chemotherapy and then take 2 tablets two times a day for 2 days. Take with food. (Patient not taking: Reported on 11/30/2018) 30 tablet 1  . ondansetron (ZOFRAN) 8 MG tablet Take 1 tablet (8 mg total) by mouth 2 (two) times daily as needed. Start on the third day after chemotherapy. (Patient not taking: Reported on 12/07/2018) 30 tablet 1  . prochlorperazine (COMPAZINE) 10 MG tablet Take 1 tablet (10 mg total) by mouth every 6 (six) hours as needed (Nausea or vomiting). (Patient not taking: Reported on 11/30/2018) 30 tablet 1  . sucralfate (CARAFATE) 1 g tablet Dissolve 1 tablet in 10 mL H20 and swallow prn soreness, up to QID. (Patient not taking: Reported on 12/14/2018) 40 tablet 5   No current facility-administered medications for this visit.    Facility-Administered Medications Ordered in Other Visits  Medication Dose Route Frequency Provider Last Rate Last Dose  . heparin lock flush 100 unit/mL  500 Units Intravenous Once [name] Men, MD      . sodium chloride flush (NS) 0.9 % injection 10 mL  10 mL Intravenous PRN [name] Men, MD        PHYSICAL EXAMINATION: ECOG PERFORMANCE STATUS: 1 - Symptomatic but completely ambulatory  Today's Vitals   12/14/18 1529 12/14/18 1534  BP: 106/85   Pulse: (!) 108   Resp: (!) 8   Temp: 98.3 F (36.8 C)   TempSrc: Oral   SpO2: 98%   Weight: 243 lb (110.2 kg)   Height: 6\' 3"  (1.905 m)   PainSc:  2    Body mass index is 30.37 kg/m.  Filed Weights   12/14/18 1529  Weight: 243 lb (110.2 kg)     GENERAL: alert, no distress and comfortable SKIN: skin color, texture, turgor are  normal, no rashes or significant lesions EYES: conjunctiva are pink and non-injected, sclera clear OROPHARYNX: mild mucositis and some residual oral candidiasis NECK: mild to moderate left neck swelling, non-tender, no palpable mass appreciated  LYMPH:  no palpable lymphadenopathy in the cervical LUNGS: clear to auscultation with normal breathing effort HEART: regular rate & rhythm and no murmurs and no lower extremity edema ABDOMEN: soft, non-tender, non-distended, normal bowel sounds Musculoskeletal: no cyanosis of digits and no clubbing  PSYCH: alert & oriented x 3, fluent speech NEURO: no focal motor/sensory deficits  LABORATORY DATA:  I have reviewed the data as listed    Component Value Date/Time   NA 139 12/14/2018 1510   NA 142 10/03/2018 1450   NA 141 05/21/2014 1226   K 4.4 12/14/2018 1510   K 4.3 05/21/2014 1226   CL 102 12/14/2018 1510   CL 108 (H) 12/28/2012 1405   CO2 29 12/14/2018 1510   CO2 28 05/21/2014 1226   GLUCOSE 81 12/14/2018 1510   GLUCOSE 101 05/21/2014 1226   GLUCOSE 95 12/28/2012 1405   BUN 19 12/14/2018 1510   BUN 15 10/03/2018 1450   BUN 13.2 05/21/2014 1226   CREATININE 0.86 12/14/2018 1510   CREATININE 1.00 09/24/2014 1413   CREATININE 0.9 05/21/2014 1226   CALCIUM 9.5 12/14/2018 1510   CALCIUM 9.3 05/21/2014 1226   PROT 7.4 10/21/2018 1148   PROT 7.2 10/03/2018 1450   PROT 7.1 01/08/2014 1203   ALBUMIN 4.4 10/21/2018 1148   ALBUMIN 4.3 10/03/2018 1450   ALBUMIN 3.7 01/08/2014 1203   AST 20 10/21/2018 1148   AST 50 (H) 01/08/2014 1203   ALT 19 10/21/2018 1148   ALT 83 (H) 01/08/2014 1203   ALKPHOS 73 10/21/2018 1148   ALKPHOS 87 01/08/2014 1203   BILITOT 0.6 10/21/2018 1148   BILITOT 0.67 01/08/2014 1203   GFRNONAA >60 12/14/2018 1510   GFRNONAA >60 10/15/2011 0224   GFRAA >60 12/14/2018 1510   GFRAA >60 10/15/2011 0224    No results found  for: SPEP, UPEP  Lab Results  Component Value Date   WBC 4.0 12/14/2018   NEUTROABS 2.6 12/14/2018   HGB 15.5 12/14/2018   HCT 45.0 12/14/2018   MCV 94.5 12/14/2018   PLT 246 12/14/2018      Chemistry      Component Value Date/Time   NA 139 12/14/2018 1510   NA 142 10/03/2018 1450   NA 141 05/21/2014 1226   K 4.4 12/14/2018 1510   K 4.3 05/21/2014 1226   CL 102 12/14/2018 1510   CL 108 (H) 12/28/2012 1405   CO2 29 12/14/2018 1510   CO2 28 05/21/2014 1226   BUN 19 12/14/2018 1510   BUN 15 10/03/2018 1450   BUN 13.2 05/21/2014 1226   CREATININE 0.86 12/14/2018 1510   CREATININE 1.00 09/24/2014 1413   CREATININE 0.9 05/21/2014 1226      Component Value Date/Time   CALCIUM 9.5 12/14/2018 1510   CALCIUM 9.3 05/21/2014 1226   ALKPHOS 73 10/21/2018 1148   ALKPHOS 87 01/08/2014 1203   AST 20 10/21/2018 1148   AST 50 (H) 01/08/2014 1203   ALT 19 10/21/2018 1148   ALT 83 (H) 01/08/2014 1203   BILITOT 0.6 10/21/2018 1148   BILITOT 0.67 01/08/2014 1203

## 2018-12-14 ENCOUNTER — Ambulatory Visit (HOSPITAL_COMMUNITY)
Admission: RE | Admit: 2018-12-14 | Discharge: 2018-12-14 | Disposition: A | Discharge status: Home / Self Care | Payer: Medicaid Other | Source: Ambulatory Visit | Attending: Hematology | Admitting: Hematology

## 2018-12-14 ENCOUNTER — Inpatient Hospital Stay: Payer: Medicaid Other

## 2018-12-14 ENCOUNTER — Encounter: Payer: Self-pay | Admitting: Hematology

## 2018-12-14 ENCOUNTER — Other Ambulatory Visit: Payer: Self-pay

## 2018-12-14 ENCOUNTER — Ambulatory Visit
Admission: RE | Admit: 2018-12-14 | Discharge: 2018-12-14 | Disposition: A | Discharge status: Home / Self Care | Payer: Medicaid Other | Source: Ambulatory Visit | Attending: Radiation Oncology | Admitting: Radiation Oncology

## 2018-12-14 ENCOUNTER — Inpatient Hospital Stay (HOSPITAL_BASED_OUTPATIENT_CLINIC_OR_DEPARTMENT_OTHER): Payer: Medicaid Other | Admitting: Hematology

## 2018-12-14 VITALS — BP 106/85 | HR 108 | Temp 98.3°F | Resp 8 | Ht 75.0 in | Wt 243.0 lb

## 2018-12-14 DIAGNOSIS — E46 Unspecified protein-calorie malnutrition: Secondary | ICD-10-CM | POA: Diagnosis not present

## 2018-12-14 DIAGNOSIS — Z87891 Personal history of nicotine dependence: Secondary | ICD-10-CM | POA: Diagnosis not present

## 2018-12-14 DIAGNOSIS — B370 Candidal stomatitis: Secondary | ICD-10-CM | POA: Diagnosis not present

## 2018-12-14 DIAGNOSIS — K1231 Oral mucositis (ulcerative) due to antineoplastic therapy: Secondary | ICD-10-CM | POA: Diagnosis not present

## 2018-12-14 DIAGNOSIS — I10 Essential (primary) hypertension: Secondary | ICD-10-CM | POA: Diagnosis not present

## 2018-12-14 DIAGNOSIS — Z86718 Personal history of other venous thrombosis and embolism: Secondary | ICD-10-CM | POA: Diagnosis not present

## 2018-12-14 DIAGNOSIS — Z7901 Long term (current) use of anticoagulants: Secondary | ICD-10-CM | POA: Diagnosis not present

## 2018-12-14 DIAGNOSIS — C090 Malignant neoplasm of tonsillar fossa: Secondary | ICD-10-CM

## 2018-12-14 DIAGNOSIS — C770 Secondary and unspecified malignant neoplasm of lymph nodes of head, face and neck: Secondary | ICD-10-CM | POA: Diagnosis not present

## 2018-12-14 DIAGNOSIS — I825Y1 Chronic embolism and thrombosis of unspecified deep veins of right proximal lower extremity: Secondary | ICD-10-CM | POA: Diagnosis not present

## 2018-12-14 DIAGNOSIS — I2609 Other pulmonary embolism with acute cor pulmonale: Secondary | ICD-10-CM

## 2018-12-14 DIAGNOSIS — Z510 Encounter for antineoplastic radiation therapy: Secondary | ICD-10-CM | POA: Diagnosis not present

## 2018-12-14 DIAGNOSIS — R221 Localized swelling, mass and lump, neck: Secondary | ICD-10-CM | POA: Insufficient documentation

## 2018-12-14 DIAGNOSIS — D6859 Other primary thrombophilia: Secondary | ICD-10-CM | POA: Diagnosis not present

## 2018-12-14 DIAGNOSIS — Z86711 Personal history of pulmonary embolism: Secondary | ICD-10-CM | POA: Diagnosis not present

## 2018-12-14 DIAGNOSIS — Z79899 Other long term (current) drug therapy: Secondary | ICD-10-CM | POA: Diagnosis not present

## 2018-12-14 DIAGNOSIS — Z5111 Encounter for antineoplastic chemotherapy: Secondary | ICD-10-CM | POA: Diagnosis not present

## 2018-12-14 LAB — BASIC METABOLIC PANEL - CANCER CENTER ONLY
Anion gap: 8 (ref 5–15)
BUN: 19 mg/dL (ref 6–20)
CO2: 29 mmol/L (ref 22–32)
Calcium: 9.5 mg/dL (ref 8.9–10.3)
Chloride: 102 mmol/L (ref 98–111)
Creatinine: 0.86 mg/dL (ref 0.61–1.24)
GFR, Est AFR Am: >60 mL/min (ref >60)
GFR, Estimated: >60 mL/min (ref >60)
Glucose, Bld: 81 mg/dL (ref 70–99)
Potassium: 4.4 mmol/L (ref 3.5–5.1)
Sodium: 139 mmol/L (ref 135–145)

## 2018-12-14 LAB — CBC WITH DIFFERENTIAL (CANCER CENTER ONLY)
Abs Immature Granulocytes: 0.05 10*3/uL (ref 0.00–0.07)
Basophils Absolute: 0 10*3/uL (ref 0.0–0.1)
Basophils Relative: 0 %
Eosinophils Absolute: 0 10*3/uL (ref 0.0–0.5)
Eosinophils Relative: 1 %
HCT: 45 % (ref 39.0–52.0)
Hemoglobin: 15.5 g/dL (ref 13.0–17.0)
Immature Granulocytes: 1 %
Lymphocytes Relative: 9 %
Lymphs Abs: 0.4 10*3/uL — ABNORMAL LOW (ref 0.7–4.0)
MCH: 32.6 pg (ref 26.0–34.0)
MCHC: 34.4 g/dL (ref 30.0–36.0)
MCV: 94.5 fL (ref 80.0–100.0)
Monocytes Absolute: 1 10*3/uL (ref 0.1–1.0)
Monocytes Relative: 25 %
Neutro Abs: 2.6 10*3/uL (ref 1.7–7.7)
Neutrophils Relative %: 64 %
Platelet Count: 246 10*3/uL (ref 150–400)
RBC: 4.76 MIL/uL (ref 4.22–5.81)
RDW: 14.1 % (ref 11.5–15.5)
WBC Count: 4 10*3/uL (ref 4.0–10.5)
nRBC: 0 % (ref 0.0–0.2)

## 2018-12-14 LAB — PROTIME-INR
INR: 3.4 — ABNORMAL HIGH (ref 0.8–1.2)
Prothrombin Time: 33.6 seconds — ABNORMAL HIGH (ref 11.4–15.2)

## 2018-12-14 LAB — MAGNESIUM: Magnesium: 2.1 mg/dL (ref 1.7–2.4)

## 2018-12-14 MED ORDER — IOHEXOL 300 MG/ML  SOLN
75.0000 mL | Freq: Once | INTRAMUSCULAR | Status: AC | PRN
  Administered 2018-12-14: 17:00:00 75 mL via INTRAVENOUS

## 2018-12-14 NOTE — Progress Notes (Signed)
[facility] OFFICE PROGRESS NOTE  Patient Care Team: Patient, No Pcp Per as PCP - General (General Practice) Annia Belt, MD as Consulting Physician (Oncology)  HEME/ONC OVERVIEW: 1. 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 Melene Plan, invasive SCCa, p16+; not a candidate for TORS per ENT  -Late 10/2018 - present: definitive chemoradiation with weekly cisplatin   2. 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 Port placed in 10/2018; PEG not yet scheduled   TREATMENT REGIMEN:  Early 2000 - present: warfarin, questionable compliance   11/08/2018 - present: definitive chemoradiation with weekly cisplatin   PERTINENT NON-HEM/ONC PROBLEMS: 1. Tobacco and EtOH abuse 2. Paroxysmal A-fib 3. 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, p16+  -S/p 4 cycles of weekly cisplatin concurrent with RT in the definitive setting; chemotherapy delayed with subsequent dose reduction to 30mg /m2 due to cytopenias  -Labs adequate today, proceed with dose # 5 of chemotherapy -Feeding tube deferred due to patient preference  -He is scheduled to complete RT on 12/27/2018; we will try to pursue Dose #6 next week if his  counts can tolerate the treatment  -PRN anti-emetics: Zofran, Compazine, Ativan and dexamethasone  Chemotherapy-associated thrombocytopenia -Secondary to chemotherapy -Plts 143k, slightly lower than last week -Patient denies any symptoms of bleeding or excess bruising, such as epistaxis, hematochezia, melena, or hematuria -We will monitor for now  Chemotherapy-associated mucositis -Secondary to chemotherapy -Currently on liquid IR morphine 10mg /0.50mL q4hrs PRN as well as occasional ibuprofen -Pain reasonably controlled -Patient was counseled on the importance of avoiding excess NSAID use due to the risk of nephrotoxicity  Hx of recurrent DVT's and PTE -He has been on warfarin since early 2000's but poor compliance -He was approved for Eliquis via the patient assistance program but still has not yet received the medication in the mail  -INR 1.1 today -Given the recurrent bilateral lower extremity pain on subtherapeutic anticoagulation, I have ordered doppler of the lower extremities  -Patient is instructed to call the clinic ASAP when he receives his Eliquis so that we can make a plan for anticoagulation -As he has been taking his warfarin only sparingly, I instructed him to hold off resuming warfarin for now so that we can start Eliquis quickly, unless his doppler shows recurrent DVT's   Oral candidiasis -Patient was prescribed fluconazole but takes it "only when I remember it" -On exam, he still has some oral candidiasis but it is improving -I counseled the patient on the importance of medication adherence  Protein malnutrition -Secondary to chemoradiation -Weight down by 3 lbs since the last visit  -He is currently taking nutritional supplements by mouth as well as some solid food -Patient declined feeding tube  -I encouraged the patient to maintain  adequate PO intake as tolerated, and to adhere to nutritional recommendations   Orders Placed This Encounter  Procedures  . US  Venous Img Lower Bilateral    Standing Status:   Future    Standing Expiration Date:   02/20/2020    Order Specific Question:   Reason for Exam (SYMPTOM  OR DIAGNOSIS REQUIRED)    Answer:   Hx of DVT, recurrent bilateral lower extremity pain    Order Specific Question:   Preferred imaging location?    Answer:   [facility]    All questions were answered. The patient knows to call the clinic with any problems, questions or concerns. No barriers to learning was detected.  Return in 1 week for labs, port flush, and clinic appt prior to Dose #6 of weekly cisplatin.   Tish Men, MD 12/21/2018 4:25 PM  CHIEF COMPLAINT: "I am doing okay"  INTERVAL HISTORY: [name] [name] returns to clinic for follow-up of squamous carcinoma of the tonsil on definitive chemoradiation.  Patient reports that he called [employer] regarding his Eliquis prescription last week and was informed that his prescription should arrive sometime this week, but he has not yet received this medication in the mail.  He reports that starting on Monday, he has had a mild bilateral lower extremity pain near the upper calves, right greater than left, but he denies any associated swelling and reports that "it does not feel like a blood clot."  He has only been taking his warfarin sparingly, and his INR is 1.1 today.  He also has been taking fluconazole "when I remember it."  He drinks 2 Carnation Instant Breakfast per day, as well as half a gallon of milk and at least 32 ounce of fluids per day.  He is able to eat some food and starch, but feels the starch-based products get stuck in the roof of his mouth.  He denies any other complaint today.  SUMMARY OF ONCOLOGIC HISTORY:   Malignant neoplasm of tonsillar fossa ([place])   10/10/2018 Imaging    CT neck w/ contrast: FINDINGS: Pharynx and larynx: There is asymmetric prominence of the left tonsillar region. No other mucosal or submucosal lesion is seen.  Salivary glands:  Parotid and submandibular glands are normal.  Thyroid: Normal  Lymph nodes: Definite adenopathy on the left. 2 level nodes which shows some internal necrosis. The larger measures 2.2 cm in diameter and the smaller measures 1.4 cm in diameter. Suspicious level 4 node image 83 just anterior to the jugular vein measuring 9 mm short axis diameter. No worrisome nodes on the right.    10/10/2018 Imaging    CT chest:  IMPRESSION: 1. Mild right hilar adenopathy, equivocal for metastasis. No additional potential evidence of metastatic disease in the chest. PET-CT could be considered for further staging evaluation, as clinically warranted. 2. 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 Ascending thoracic aortic aneurysm. Recommend semi-annual imaging followup by CTA or MRA and referral to cardiothoracic surgery if not already obtained. This recommendation follows 2010 ACCF/AHA/AATS/ACR/ASA/SCA/SCAI/SIR/STS/SVM Guidelines for the Diagnosis and Management of Patients With Thoracic Aortic Disease. Circulation. 2010; 121: Z308-M578. Aortic aneurysm NOS (ICD10-I71.9).    10/17/2018 Initial Diagnosis    Tonsil cancer ([place])    10/18/2018 Imaging    PET: IMPRESSION: 1. 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 2. Local hypermetabolic nodal metastasis to the LEFT level II nodal station. 3. No contralateral nodal metastasis. 4. No evidence distant metastatic disease. 5. Two small pulmonary nodules in  the LEFT lower lobe are favored benign. Recommend attention on follow-up. 6. Several small common iliac lymph nodes with low metabolic activity. Favor reactive adenopathy.    10/21/2018 Cancer Staging    Staging form: Pharynx - HPV-Mediated Oropharynx, AJCC 8th Edition - Clinical: Stage I (cT2, cN1, cM0, p16+) - Signed by Eppie Gibson, MD on 10/21/2018    11/10/2018 -  Chemotherapy    The patient had palonosetron (ALOXI) injection 0.25 mg, 0.25 mg, Intravenous,   Once, 4 of 7 cycles Administration: 0.25 mg (11/10/2018), 0.25 mg (11/17/2018), 0.25 mg (11/24/2018), 0.25 mg (12/15/2018) CISplatin (PLATINOL) 100 mg in sodium chloride 0.9 % 500 mL chemo infusion, 40 mg/m2 = 100 mg, Intravenous,  Once, 4 of 7 cycles Dose modification: 30 mg/m2 (original dose 40 mg/m2, Cycle 5, Reason: Dose not tolerated) Administration: 100 mg (11/10/2018), 100 mg (11/17/2018), 100 mg (11/24/2018), 75 mg (12/15/2018) fosaprepitant (EMEND) 150 mg, dexamethasone (DECADRON) 12 mg in sodium chloride 0.9 % 145 mL IVPB, , Intravenous,  Once, 4 of 7 cycles Administration:  (11/10/2018),  (11/17/2018),  (11/24/2018),  (12/15/2018)  for chemotherapy treatment.      REVIEW OF SYSTEMS:   Constitutional: ( - ) fevers, ( - )  chills , ( - ) night sweats Eyes: ( - ) blurriness of vision, ( - ) double vision, ( - ) watery eyes Ears, nose, mouth, throat, and face: ( + ) mucositis, ( + ) sore throat Respiratory: ( - ) cough, ( - ) dyspnea, ( - ) wheezes Cardiovascular: ( - ) palpitation, ( - ) chest discomfort, ( - ) lower extremity swelling Gastrointestinal:  ( - ) nausea, ( - ) heartburn, ( - ) change in bowel habits Skin: ( - ) abnormal skin rashes Lymphatics: ( - ) new lymphadenopathy, ( - ) easy bruising Neurological: ( - ) numbness, ( - ) tingling, ( - ) new weaknesses Behavioral/Psych: ( - ) mood change, ( - ) new changes  All other systems were reviewed with the patient and are negative.  I have reviewed the past medical history, past surgical history, social history and family history with the patient and they are unchanged from previous note.  ALLERGIES:  has No Known Allergies.  MEDICATIONS:  Current Outpatient Medications  Medication Sig Dispense Refill  . dexamethasone (DECADRON) 4 MG tablet Take 2 tablets by mouth once a day on the day after chemotherapy and then take 2 tablets two times a day for 2 days. Take with food. 30 tablet 1  . fluconazole (DIFLUCAN) 100 MG tablet Take 2  tabs on the first day, and then 1 tab daily until finished 22 tablet 0  . ibuprofen (ADVIL) 100 MG tablet Take 200 mg by mouth as needed for fever.    . lidocaine-prilocaine (EMLA) cream Apply to affected area once 30 g 3  . amLODipine (NORVASC) 10 MG tablet TAKE 1 TABLET BY MOUTH EVERY DAY (Patient not taking: Reported on 12/21/2018) 90 tablet 2  . Fluticasone Propionate 0.05 % LOTN Apply 1 application topically 2 (two) times daily. Apply to rash twice daily as needed 120 mL 1  . lidocaine (XYLOCAINE) 2 % solution Patient: Mix 1part 2% viscous lidocaine, 1part H20. Swish & swallow 22mL of diluted mixture, 87min before meals and at bedtime, up to QID (Patient not taking: Reported on 12/21/2018) 100 mL 5  . LORazepam (ATIVAN) 0.5 MG tablet Take 1 tablet (0.5 mg total) by mouth every 6 (six) hours as needed (Nausea or vomiting). (Patient not  taking: Reported on 12/21/2018) 30 tablet 0  . Morphine Sulfate (MORPHINE CONCENTRATE) 10 mg / 0.5 ml concentrated solution Take 0.5 mLs (10 mg total) by mouth every 6 (six) hours as needed for up to 30 days for severe pain. (Patient not taking: Reported on 12/21/2018) 60 mL 0  . ondansetron (ZOFRAN) 8 MG tablet Take 1 tablet (8 mg total) by mouth 2 (two) times daily as needed. Start on the third day after chemotherapy. (Patient not taking: Reported on 12/07/2018) 30 tablet 1  . oxyCODONE-acetaminophen (PERCOCET) 5-325 MG tablet Take 1 tablet by mouth every 4 (four) hours as needed for severe pain. (Patient not taking: Reported on 12/21/2018) 25 tablet 0  . prochlorperazine (COMPAZINE) 10 MG tablet Take 1 tablet (10 mg total) by mouth every 6 (six) hours as needed (Nausea or vomiting). (Patient not taking: Reported on 11/30/2018) 30 tablet 1  . sodium fluoride (PREVIDENT 5000 PLUS) 1.1 % CREA dental cream Apply to tooth brush. Brush teeth for 2 minutes. Spit out excess. DO NOT rinse afterwards. Repeat nightly. 1 Tube prn  . sucralfate (CARAFATE) 1 g tablet Dissolve 1 tablet in 10  mL H20 and swallow prn soreness, up to QID. (Patient not taking: Reported on 12/14/2018) 40 tablet 5  . warfarin (COUMADIN) 10 MG tablet Take 5 mg by mouth daily at 6 PM.      No current facility-administered medications for this visit.    Facility-Administered Medications Ordered in Other Visits  Medication Dose Route Frequency Provider Last Rate Last Dose  . heparin lock flush 100 unit/mL  500 Units Intravenous Once Tish Men, MD      . sodium chloride flush (NS) 0.9 % injection 10 mL  10 mL Intravenous PRN Tish Men, MD        PHYSICAL EXAMINATION: ECOG PERFORMANCE STATUS: 1 - Symptomatic but completely ambulatory  Today's Vitals   12/21/18 1535 12/21/18 1551  BP: 117/84   Pulse: 71   Resp: 18   Temp: 97.8 F (36.6 C)   TempSrc: Oral   SpO2: 96%   Weight: 237 lb 9.6 oz (107.8 kg)   Height: 6\' 3"  (1.905 m)   PainSc:  2    Body mass index is 29.7 kg/m.  Filed Weights   12/21/18 1535  Weight: 237 lb 9.6 oz (107.8 kg)    GENERAL: alert, no distress and comfortable SKIN: mild erythema in the left base of the neck, mild tenderness with palpation  EYES: conjunctiva are pink and non-injected, sclera clear OROPHARYNX: some persistent oral candidiasis, overall improving NECK: supple, mild tenderness to palpation in the left side of the neck from skin irriation LUNGS: clear to auscultation with normal breathing effort HEART: regular rate & rhythm and no murmurs and no lower extremity edema ABDOMEN: soft, non-tender, non-distended, normal bowel sounds Musculoskeletal: no cyanosis of digits and no clubbing  PSYCH: alert & oriented x 3, fluent speech NEURO: no focal motor/sensory deficits  LABORATORY DATA:  I have reviewed the data as listed    Component Value Date/Time   NA 140 12/21/2018 1500   NA 142 10/03/2018 1450   NA 141 05/21/2014 1226   K 4.0 12/21/2018 1500   K 4.3 05/21/2014 1226   CL 101 12/21/2018 1500   CL 108 (H) 12/28/2012 1405   CO2 28 12/21/2018 1500    CO2 28 05/21/2014 1226   GLUCOSE 87 12/21/2018 1500   GLUCOSE 101 05/21/2014 1226   GLUCOSE 95 12/28/2012 1405   BUN 21 (H) 12/21/2018  1500   BUN 15 10/03/2018 1450   BUN 13.2 05/21/2014 1226   CREATININE 1.00 12/21/2018 1500   CREATININE 1.00 09/24/2014 1413   CREATININE 0.9 05/21/2014 1226   CALCIUM 9.2 12/21/2018 1500   CALCIUM 9.3 05/21/2014 1226   PROT 7.4 10/21/2018 1148   PROT 7.2 10/03/2018 1450   PROT 7.1 01/08/2014 1203   ALBUMIN 4.4 10/21/2018 1148   ALBUMIN 4.3 10/03/2018 1450   ALBUMIN 3.7 01/08/2014 1203   AST 20 10/21/2018 1148   AST 50 (H) 01/08/2014 1203   ALT 19 10/21/2018 1148   ALT 83 (H) 01/08/2014 1203   ALKPHOS 73 10/21/2018 1148   ALKPHOS 87 01/08/2014 1203   BILITOT 0.6 10/21/2018 1148   BILITOT 0.67 01/08/2014 1203   GFRNONAA >60 12/21/2018 1500   GFRNONAA >60 10/15/2011 0224   GFRAA >60 12/21/2018 1500   GFRAA >60 10/15/2011 0224    No results found for: SPEP, UPEP  Lab Results  Component Value Date   WBC 8.6 12/21/2018   NEUTROABS 7.1 12/21/2018   HGB 16.3 12/21/2018   HCT 47.8 12/21/2018   MCV 95.8 12/21/2018   PLT 143 (L) 12/21/2018      Chemistry      Component Value Date/Time   NA 140 12/21/2018 1500   NA 142 10/03/2018 1450   NA 141 05/21/2014 1226   K 4.0 12/21/2018 1500   K 4.3 05/21/2014 1226   CL 101 12/21/2018 1500   CL 108 (H) 12/28/2012 1405   CO2 28 12/21/2018 1500   CO2 28 05/21/2014 1226   BUN 21 (H) 12/21/2018 1500   BUN 15 10/03/2018 1450   BUN 13.2 05/21/2014 1226   CREATININE 1.00 12/21/2018 1500   CREATININE 1.00 09/24/2014 1413   CREATININE 0.9 05/21/2014 1226      Component Value Date/Time   CALCIUM 9.2 12/21/2018 1500   CALCIUM 9.3 05/21/2014 1226   ALKPHOS 73 10/21/2018 1148   ALKPHOS 87 01/08/2014 1203   AST 20 10/21/2018 1148   AST 50 (H) 01/08/2014 1203   ALT 19 10/21/2018 1148   ALT 83 (H) 01/08/2014 1203   BILITOT 0.6 10/21/2018 1148   BILITOT 0.67 01/08/2014 1203       RADIOGRAPHIC  STUDIES: I have personally reviewed the radiological images as listed below and agreed with the findings in the report. Ct Soft Tissue Neck W Contrast  Result Date: 12/14/2018 CLINICAL DATA:  Left neck swelling. Currently on chemo radiation. Previously demonstrated left tonsillar mass. EXAM: CT NECK WITH CONTRAST TECHNIQUE: Multidetector CT imaging of the neck was performed using the standard protocol following the bolus administration of intravenous contrast. CONTRAST:  23mL OMNIPAQUE IOHEXOL 300 MG/ML  SOLN COMPARISON:  CT neck 10/10/2018 and PET CT 10/18/2018 FINDINGS: PHARYNX AND LARYNX: --Nasopharynx: Fossae of Rosenmuller are clear. Normal adenoid tonsils for age. --Oral cavity and oropharynx: Previously demonstrated fullness of the left lingual tonsil has decreased. No discrete mass. No new lesion. --Hypopharynx: Normal vallecula and pyriform sinuses. --Larynx: Normal epiglottis and pre-epiglottic space. Normal aryepiglottic and vocal folds. --Retropharyngeal space: No abscess, effusion or lymphadenopathy. SALIVARY GLANDS: --Parotid: No mass lesion or inflammation. No sialolithiasis or ductal dilatation. --Submandibular: Symmetric without inflammation. No sialolithiasis or ductal dilatation. --Sublingual: Normal. No ranula or other visible lesion of the base of tongue and floor of mouth. THYROID: Normal. LYMPH NODES: No enlarged or abnormal density lymph nodes. Previously demonstrated abnormal lymph nodes along the left cervical chain have resolved. No new enlarged or abnormal density nodes. VASCULAR:  Major cervical vessels are patent. LIMITED INTRACRANIAL: Normal. VISUALIZED ORBITS: Normal. MASTOIDS AND VISUALIZED PARANASAL SINUSES: No fluid levels or advanced mucosal thickening. No mastoid effusion. SKELETON: No bony spinal canal stenosis. No lytic or blastic lesions. UPPER CHEST: Clear. OTHER: None. IMPRESSION: 1. No acute abnormality of the cervical soft tissues. No new soft tissue mass. 2. Markedly  decreased fullness at the left lingual tonsillar site identified on PET CT. 3. Resolution of left cervical lymphadenopathy. Electronically Signed   By: Ulyses Jarred M.D.   On: 12/14/2018 19:05

## 2018-12-15 ENCOUNTER — Inpatient Hospital Stay: Payer: Medicaid Other | Admitting: Nutrition

## 2018-12-15 ENCOUNTER — Other Ambulatory Visit: Payer: Self-pay

## 2018-12-15 ENCOUNTER — Ambulatory Visit (HOSPITAL_COMMUNITY): Payer: Medicaid Other

## 2018-12-15 ENCOUNTER — Inpatient Hospital Stay: Payer: Medicaid Other

## 2018-12-15 ENCOUNTER — Ambulatory Visit
Admission: RE | Admit: 2018-12-15 | Discharge: 2018-12-15 | Disposition: A | Discharge status: Home / Self Care | Payer: Medicaid Other | Source: Ambulatory Visit | Attending: Radiation Oncology | Admitting: Radiation Oncology

## 2018-12-15 ENCOUNTER — Telehealth: Payer: Self-pay | Admitting: Hematology

## 2018-12-15 ENCOUNTER — Telehealth: Payer: Self-pay | Admitting: *Deleted

## 2018-12-15 VITALS — BP 131/87 | HR 57 | Temp 97.4°F | Resp 18

## 2018-12-15 DIAGNOSIS — D6859 Other primary thrombophilia: Secondary | ICD-10-CM | POA: Diagnosis not present

## 2018-12-15 DIAGNOSIS — C770 Secondary and unspecified malignant neoplasm of lymph nodes of head, face and neck: Secondary | ICD-10-CM | POA: Diagnosis not present

## 2018-12-15 DIAGNOSIS — Z7901 Long term (current) use of anticoagulants: Secondary | ICD-10-CM | POA: Diagnosis not present

## 2018-12-15 DIAGNOSIS — Z87891 Personal history of nicotine dependence: Secondary | ICD-10-CM | POA: Diagnosis not present

## 2018-12-15 DIAGNOSIS — I10 Essential (primary) hypertension: Secondary | ICD-10-CM | POA: Diagnosis not present

## 2018-12-15 DIAGNOSIS — Z86711 Personal history of pulmonary embolism: Secondary | ICD-10-CM | POA: Diagnosis not present

## 2018-12-15 DIAGNOSIS — Z79899 Other long term (current) drug therapy: Secondary | ICD-10-CM | POA: Diagnosis not present

## 2018-12-15 DIAGNOSIS — C090 Malignant neoplasm of tonsillar fossa: Secondary | ICD-10-CM | POA: Diagnosis not present

## 2018-12-15 DIAGNOSIS — Z86718 Personal history of other venous thrombosis and embolism: Secondary | ICD-10-CM | POA: Diagnosis not present

## 2018-12-15 DIAGNOSIS — Z510 Encounter for antineoplastic radiation therapy: Secondary | ICD-10-CM | POA: Diagnosis not present

## 2018-12-15 DIAGNOSIS — Z5111 Encounter for antineoplastic chemotherapy: Secondary | ICD-10-CM | POA: Diagnosis not present

## 2018-12-15 MED ORDER — SODIUM CHLORIDE 0.9 % IV SOLN
30.0000 mg/m2 | Freq: Once | INTRAVENOUS | Status: AC
  Administered 2018-12-15: 75 mg via INTRAVENOUS
  Filled 2018-12-15: qty 75

## 2018-12-15 MED ORDER — SODIUM CHLORIDE 0.9% FLUSH
10.0000 mL | INTRAVENOUS | Status: DC | PRN
  Administered 2018-12-15: 15:00:00 10 mL
  Filled 2018-12-15: qty 10

## 2018-12-15 MED ORDER — POTASSIUM CHLORIDE 2 MEQ/ML IV SOLN
Freq: Once | INTRAVENOUS | Status: AC
  Administered 2018-12-15: 09:00:00 via INTRAVENOUS
  Filled 2018-12-15: qty 10

## 2018-12-15 MED ORDER — HEPARIN SOD (PORK) LOCK FLUSH 100 UNIT/ML IV SOLN
500.0000 [IU] | Freq: Once | INTRAVENOUS | Status: AC | PRN
  Administered 2018-12-15: 500 [IU]
  Filled 2018-12-15: qty 5

## 2018-12-15 MED ORDER — SODIUM CHLORIDE 0.9 % IV SOLN
Freq: Once | INTRAVENOUS | Status: AC
  Administered 2018-12-15: 11:00:00 via INTRAVENOUS
  Filled 2018-12-15: qty 5

## 2018-12-15 MED ORDER — PALONOSETRON HCL INJECTION 0.25 MG/5ML
0.2500 mg | Freq: Once | INTRAVENOUS | Status: AC
  Administered 2018-12-15: 11:00:00 0.25 mg via INTRAVENOUS

## 2018-12-15 MED ORDER — SODIUM CHLORIDE 0.9 % IV SOLN
Freq: Once | INTRAVENOUS | Status: AC
  Administered 2018-12-15: 08:00:00 via INTRAVENOUS
  Filled 2018-12-15: qty 250

## 2018-12-15 NOTE — Patient Instructions (Signed)
Cisplatin injection  What is this medicine?  CISPLATIN (SIS pla tin) is a chemotherapy drug. It targets fast dividing cells, like cancer cells, and causes these cells to die. This medicine is used to treat many types of cancer like bladder, ovarian, and testicular cancers.  This medicine may be used for other purposes; ask your health care provider or pharmacist if you have questions.  COMMON BRAND NAME(S): Platinol, Platinol -AQ  What should I tell my health care provider before I take this medicine?  They need to know if you have any of these conditions:  -blood disorders  -hearing problems  -kidney disease  -recent or ongoing radiation therapy  -an unusual or allergic reaction to cisplatin, carboplatin, other chemotherapy, other medicines, foods, dyes, or preservatives  -pregnant or trying to get pregnant  -breast-feeding  How should I use this medicine?  This drug is given as an infusion into a vein. It is administered in a hospital or clinic by a specially trained health care professional.  Talk to your pediatrician regarding the use of this medicine in children. Special care may be needed.  Overdosage: If you think you have taken too much of this medicine contact a poison control center or emergency room at once.  NOTE: This medicine is only for you. Do not share this medicine with others.  What if I miss a dose?  It is important not to miss a dose. Call your doctor or health care professional if you are unable to keep an appointment.  What may interact with this medicine?  -dofetilide  -foscarnet  -medicines for seizures  -medicines to increase blood counts like filgrastim, pegfilgrastim, sargramostim  -probenecid  -pyridoxine used with altretamine  -rituximab  -some antibiotics like amikacin, gentamicin, neomycin, polymyxin B, streptomycin, tobramycin  -sulfinpyrazone  -vaccines  -zalcitabine  Talk to your doctor or health care professional before taking any of these  medicines:  -acetaminophen  -aspirin  -ibuprofen  -ketoprofen  -naproxen  This list may not describe all possible interactions. Give your health care provider a list of all the medicines, herbs, non-prescription drugs, or dietary supplements you use. Also tell them if you smoke, drink alcohol, or use illegal drugs. Some items may interact with your medicine.  What should I watch for while using this medicine?  Your condition will be monitored carefully while you are receiving this medicine. You will need important blood work done while you are taking this medicine.  This drug may make you feel generally unwell. This is not uncommon, as chemotherapy can affect healthy cells as well as cancer cells. Report any side effects. Continue your course of treatment even though you feel ill unless your doctor tells you to stop.  In some cases, you may be given additional medicines to help with side effects. Follow all directions for their use.  Call your doctor or health care professional for advice if you get a fever, chills or sore throat, or other symptoms of a cold or flu. Do not treat yourself. This drug decreases your body's ability to fight infections. Try to avoid being around people who are sick.  This medicine may increase your risk to bruise or bleed. Call your doctor or health care professional if you notice any unusual bleeding.  Be careful brushing and flossing your teeth or using a toothpick because you may get an infection or bleed more easily. If you have any dental work done, tell your dentist you are receiving this medicine.  Avoid taking products   that contain aspirin, acetaminophen, ibuprofen, naproxen, or ketoprofen unless instructed by your doctor. These medicines may hide a fever.  Do not become pregnant while taking this medicine. Women should inform their doctor if they wish to become pregnant or think they might be pregnant. There is a potential for serious side effects to an unborn child. Talk to  your health care professional or pharmacist for more information. Do not breast-feed an infant while taking this medicine.  Drink fluids as directed while you are taking this medicine. This will help protect your kidneys.  Call your doctor or health care professional if you get diarrhea. Do not treat yourself.  What side effects may I notice from receiving this medicine?  Side effects that you should report to your doctor or health care professional as soon as possible:  -allergic reactions like skin rash, itching or hives, swelling of the face, lips, or tongue  -signs of infection - fever or chills, cough, sore throat, pain or difficulty passing urine  -signs of decreased platelets or bleeding - bruising, pinpoint red spots on the skin, black, tarry stools, nosebleeds  -signs of decreased red blood cells - unusually weak or tired, fainting spells, lightheadedness  -breathing problems  -changes in hearing  -gout pain  -low blood counts - This drug may decrease the number of white blood cells, red blood cells and platelets. You may be at increased risk for infections and bleeding.  -nausea and vomiting  -pain, swelling, redness or irritation at the injection site  -pain, tingling, numbness in the hands or feet  -problems with balance, movement  -trouble passing urine or change in the amount of urine  Side effects that usually do not require medical attention (report to your doctor or health care professional if they continue or are bothersome):  -changes in vision  -loss of appetite  -metallic taste in the mouth or changes in taste  This list may not describe all possible side effects. Call your doctor for medical advice about side effects. You may report side effects to FDA at 1-800-FDA-1088.  Where should I keep my medicine?  This drug is given in a hospital or clinic and will not be stored at home.  NOTE: This sheet is a summary. It may not cover all possible information. If you have questions about this medicine,  talk to your doctor, pharmacist, or health care provider.   2019 Elsevier/Gold Standard (2007-10-11 14:40:54)

## 2018-12-15 NOTE — Telephone Encounter (Signed)
Per 5/27 los No change in appts

## 2018-12-15 NOTE — Telephone Encounter (Signed)
-----   Message from Tish Men, MD sent at 12/15/2018  8:48 AM EDT ----- Fidela Salisbury,  Can we let Mr. Thelin know that his CT neck was normal? The swelling may be from lymphadema. He can massage it as needed, and it should help with the swelling.  Thanks.  Dixon  ----- Message ----- From: Interface, Rad Results In Sent: 12/14/2018   7:07 PM EDT To: Tish Men, MD

## 2018-12-15 NOTE — Telephone Encounter (Signed)
Spoke with pt in infusion room today.  Informed pt of CT neck scan results as per Dr. [name] instructions below.  Pt voiced understanding.

## 2018-12-15 NOTE — Progress Notes (Signed)
Nutrition follow up completed with patient during infusion. Patient is receiving treatment for left tonsil cancer. Patient denies nausea, vomiting, constipation, and diarrhea. He is drinking to [employer] daily. He continues to try to eat some soft foods. Weight decreased and documented as 243 pounds May 27, decreased from 248.2 pounds May 20. Labs were reviewed. Patient does not want to schedule feeding tube placement.  Nutrition diagnosis:  Predicted suboptimal energy intake has evolved into inadequate oral intake related to tonsil cancer and associated treatments as evidenced by 10 pound weight loss over past month.  Intervention: I again reviewed the importance of adequate nutrition during treatment. Reminded patient that weight loss during treatment can result in muscle loss, decreased healing, and decreased immune system.  Explained the importance of good nutrition for healing and preserving muscle mass and energy. Recommended patient increase Carnation breakfast essentials as tolerated.  Monitoring, evaluation, goals: Patient will tolerate increased calories and protein to minimize weight loss.  Next visit: Thursday, June 4.  **Disclaimer: This note was dictated with voice recognition software. Similar sounding words can inadvertently be transcribed and this note may contain transcription errors which may not have been corrected upon publication of note.**

## 2018-12-16 ENCOUNTER — Other Ambulatory Visit: Payer: Self-pay

## 2018-12-16 ENCOUNTER — Ambulatory Visit
Admission: RE | Admit: 2018-12-16 | Discharge: 2018-12-16 | Disposition: A | Discharge status: Home / Self Care | Payer: Medicaid Other | Source: Ambulatory Visit | Attending: Radiation Oncology | Admitting: Radiation Oncology

## 2018-12-16 DIAGNOSIS — D6859 Other primary thrombophilia: Secondary | ICD-10-CM | POA: Diagnosis not present

## 2018-12-16 DIAGNOSIS — C090 Malignant neoplasm of tonsillar fossa: Secondary | ICD-10-CM

## 2018-12-16 DIAGNOSIS — Z86711 Personal history of pulmonary embolism: Secondary | ICD-10-CM | POA: Diagnosis not present

## 2018-12-16 DIAGNOSIS — Z79899 Other long term (current) drug therapy: Secondary | ICD-10-CM | POA: Diagnosis not present

## 2018-12-16 DIAGNOSIS — Z7901 Long term (current) use of anticoagulants: Secondary | ICD-10-CM | POA: Diagnosis not present

## 2018-12-16 DIAGNOSIS — Z87891 Personal history of nicotine dependence: Secondary | ICD-10-CM | POA: Diagnosis not present

## 2018-12-16 DIAGNOSIS — Z510 Encounter for antineoplastic radiation therapy: Secondary | ICD-10-CM | POA: Diagnosis not present

## 2018-12-16 DIAGNOSIS — Z86718 Personal history of other venous thrombosis and embolism: Secondary | ICD-10-CM | POA: Diagnosis not present

## 2018-12-16 DIAGNOSIS — C770 Secondary and unspecified malignant neoplasm of lymph nodes of head, face and neck: Secondary | ICD-10-CM | POA: Diagnosis not present

## 2018-12-16 DIAGNOSIS — I10 Essential (primary) hypertension: Secondary | ICD-10-CM | POA: Diagnosis not present

## 2018-12-16 MED ORDER — SONAFINE EX EMUL
1.0000 "application " | Freq: Once | CUTANEOUS | Status: AC
  Administered 2018-12-16: 1 via TOPICAL

## 2018-12-19 ENCOUNTER — Ambulatory Visit
Admission: RE | Admit: 2018-12-19 | Discharge: 2018-12-19 | Disposition: A | Discharge status: Home / Self Care | Payer: Medicaid Other | Source: Ambulatory Visit | Attending: Radiation Oncology | Admitting: Radiation Oncology

## 2018-12-19 ENCOUNTER — Ambulatory Visit: Payer: Medicaid Other

## 2018-12-19 ENCOUNTER — Other Ambulatory Visit: Payer: Self-pay

## 2018-12-19 DIAGNOSIS — Z79899 Other long term (current) drug therapy: Secondary | ICD-10-CM | POA: Insufficient documentation

## 2018-12-19 DIAGNOSIS — Z87891 Personal history of nicotine dependence: Secondary | ICD-10-CM | POA: Insufficient documentation

## 2018-12-19 DIAGNOSIS — C090 Malignant neoplasm of tonsillar fossa: Secondary | ICD-10-CM | POA: Diagnosis not present

## 2018-12-19 DIAGNOSIS — Z510 Encounter for antineoplastic radiation therapy: Secondary | ICD-10-CM | POA: Diagnosis not present

## 2018-12-19 DIAGNOSIS — Z7901 Long term (current) use of anticoagulants: Secondary | ICD-10-CM | POA: Diagnosis not present

## 2018-12-19 DIAGNOSIS — Z86718 Personal history of other venous thrombosis and embolism: Secondary | ICD-10-CM | POA: Insufficient documentation

## 2018-12-19 DIAGNOSIS — C770 Secondary and unspecified malignant neoplasm of lymph nodes of head, face and neck: Secondary | ICD-10-CM | POA: Insufficient documentation

## 2018-12-19 DIAGNOSIS — Z86711 Personal history of pulmonary embolism: Secondary | ICD-10-CM | POA: Insufficient documentation

## 2018-12-19 DIAGNOSIS — D6859 Other primary thrombophilia: Secondary | ICD-10-CM | POA: Insufficient documentation

## 2018-12-19 DIAGNOSIS — I10 Essential (primary) hypertension: Secondary | ICD-10-CM | POA: Diagnosis not present

## 2018-12-20 ENCOUNTER — Other Ambulatory Visit: Payer: Self-pay

## 2018-12-20 ENCOUNTER — Ambulatory Visit
Admission: RE | Admit: 2018-12-20 | Discharge: 2018-12-20 | Disposition: A | Discharge status: Home / Self Care | Payer: Medicaid Other | Source: Ambulatory Visit | Attending: Radiation Oncology | Admitting: Radiation Oncology

## 2018-12-20 ENCOUNTER — Encounter: Payer: Self-pay | Admitting: *Deleted

## 2018-12-20 DIAGNOSIS — Z79899 Other long term (current) drug therapy: Secondary | ICD-10-CM | POA: Diagnosis not present

## 2018-12-20 DIAGNOSIS — C770 Secondary and unspecified malignant neoplasm of lymph nodes of head, face and neck: Secondary | ICD-10-CM | POA: Diagnosis not present

## 2018-12-20 DIAGNOSIS — Z87891 Personal history of nicotine dependence: Secondary | ICD-10-CM | POA: Diagnosis not present

## 2018-12-20 DIAGNOSIS — Z86718 Personal history of other venous thrombosis and embolism: Secondary | ICD-10-CM | POA: Diagnosis not present

## 2018-12-20 DIAGNOSIS — Z510 Encounter for antineoplastic radiation therapy: Secondary | ICD-10-CM | POA: Diagnosis not present

## 2018-12-20 DIAGNOSIS — Z86711 Personal history of pulmonary embolism: Secondary | ICD-10-CM | POA: Diagnosis not present

## 2018-12-20 DIAGNOSIS — D6859 Other primary thrombophilia: Secondary | ICD-10-CM | POA: Diagnosis not present

## 2018-12-20 DIAGNOSIS — Z7901 Long term (current) use of anticoagulants: Secondary | ICD-10-CM | POA: Diagnosis not present

## 2018-12-20 DIAGNOSIS — I10 Essential (primary) hypertension: Secondary | ICD-10-CM | POA: Diagnosis not present

## 2018-12-20 DIAGNOSIS — C090 Malignant neoplasm of tonsillar fossa: Secondary | ICD-10-CM | POA: Diagnosis not present

## 2018-12-21 ENCOUNTER — Inpatient Hospital Stay: Payer: Medicaid Other

## 2018-12-21 ENCOUNTER — Ambulatory Visit
Admission: RE | Admit: 2018-12-21 | Discharge: 2018-12-21 | Disposition: A | Discharge status: Home / Self Care | Payer: Medicaid Other | Source: Ambulatory Visit | Attending: Radiation Oncology | Admitting: Radiation Oncology

## 2018-12-21 ENCOUNTER — Ambulatory Visit: Payer: Medicaid Other

## 2018-12-21 ENCOUNTER — Inpatient Hospital Stay: Payer: Medicaid Other | Attending: Hematology | Admitting: Hematology

## 2018-12-21 ENCOUNTER — Other Ambulatory Visit: Payer: Self-pay

## 2018-12-21 ENCOUNTER — Encounter: Payer: Self-pay | Admitting: Hematology

## 2018-12-21 VITALS — BP 117/84 | HR 71 | Temp 97.8°F | Resp 18 | Ht 75.0 in | Wt 237.6 lb

## 2018-12-21 DIAGNOSIS — K1231 Oral mucositis (ulcerative) due to antineoplastic therapy: Secondary | ICD-10-CM | POA: Diagnosis not present

## 2018-12-21 DIAGNOSIS — D6959 Other secondary thrombocytopenia: Secondary | ICD-10-CM | POA: Insufficient documentation

## 2018-12-21 DIAGNOSIS — Z510 Encounter for antineoplastic radiation therapy: Secondary | ICD-10-CM | POA: Diagnosis not present

## 2018-12-21 DIAGNOSIS — I825Y1 Chronic embolism and thrombosis of unspecified deep veins of right proximal lower extremity: Secondary | ICD-10-CM | POA: Diagnosis not present

## 2018-12-21 DIAGNOSIS — Z86711 Personal history of pulmonary embolism: Secondary | ICD-10-CM | POA: Diagnosis not present

## 2018-12-21 DIAGNOSIS — Z79899 Other long term (current) drug therapy: Secondary | ICD-10-CM | POA: Diagnosis not present

## 2018-12-21 DIAGNOSIS — Z86718 Personal history of other venous thrombosis and embolism: Secondary | ICD-10-CM | POA: Diagnosis not present

## 2018-12-21 DIAGNOSIS — Z7901 Long term (current) use of anticoagulants: Secondary | ICD-10-CM | POA: Diagnosis not present

## 2018-12-21 DIAGNOSIS — B370 Candidal stomatitis: Secondary | ICD-10-CM | POA: Diagnosis not present

## 2018-12-21 DIAGNOSIS — Z5111 Encounter for antineoplastic chemotherapy: Secondary | ICD-10-CM | POA: Insufficient documentation

## 2018-12-21 DIAGNOSIS — D6859 Other primary thrombophilia: Secondary | ICD-10-CM | POA: Diagnosis not present

## 2018-12-21 DIAGNOSIS — T451X5A Adverse effect of antineoplastic and immunosuppressive drugs, initial encounter: Secondary | ICD-10-CM | POA: Diagnosis not present

## 2018-12-21 DIAGNOSIS — I10 Essential (primary) hypertension: Secondary | ICD-10-CM | POA: Diagnosis not present

## 2018-12-21 DIAGNOSIS — E46 Unspecified protein-calorie malnutrition: Secondary | ICD-10-CM | POA: Insufficient documentation

## 2018-12-21 DIAGNOSIS — Z87891 Personal history of nicotine dependence: Secondary | ICD-10-CM | POA: Diagnosis not present

## 2018-12-21 DIAGNOSIS — C090 Malignant neoplasm of tonsillar fossa: Secondary | ICD-10-CM

## 2018-12-21 DIAGNOSIS — I2609 Other pulmonary embolism with acute cor pulmonale: Secondary | ICD-10-CM

## 2018-12-21 DIAGNOSIS — C770 Secondary and unspecified malignant neoplasm of lymph nodes of head, face and neck: Secondary | ICD-10-CM | POA: Diagnosis not present

## 2018-12-21 LAB — CBC WITH DIFFERENTIAL (CANCER CENTER ONLY)
Abs Immature Granulocytes: 0.13 10*3/uL — ABNORMAL HIGH (ref 0.00–0.07)
Basophils Absolute: 0 10*3/uL (ref 0.0–0.1)
Basophils Relative: 0 %
Eosinophils Absolute: 0 10*3/uL (ref 0.0–0.5)
Eosinophils Relative: 0 %
HCT: 47.8 % (ref 39.0–52.0)
Hemoglobin: 16.3 g/dL (ref 13.0–17.0)
Immature Granulocytes: 2 %
Lymphocytes Relative: 3 %
Lymphs Abs: 0.3 10*3/uL — ABNORMAL LOW (ref 0.7–4.0)
MCH: 32.7 pg (ref 26.0–34.0)
MCHC: 34.1 g/dL (ref 30.0–36.0)
MCV: 95.8 fL (ref 80.0–100.0)
Monocytes Absolute: 1.1 10*3/uL — ABNORMAL HIGH (ref 0.1–1.0)
Monocytes Relative: 13 %
Neutro Abs: 7.1 10*3/uL (ref 1.7–7.7)
Neutrophils Relative %: 82 %
Platelet Count: 143 10*3/uL — ABNORMAL LOW (ref 150–400)
RBC: 4.99 MIL/uL (ref 4.22–5.81)
RDW: 15.1 % (ref 11.5–15.5)
WBC Count: 8.6 10*3/uL (ref 4.0–10.5)
nRBC: 0 % (ref 0.0–0.2)

## 2018-12-21 LAB — BASIC METABOLIC PANEL - CANCER CENTER ONLY
Anion gap: 11 (ref 5–15)
BUN: 21 mg/dL — ABNORMAL HIGH (ref 6–20)
CO2: 28 mmol/L (ref 22–32)
Calcium: 9.2 mg/dL (ref 8.9–10.3)
Chloride: 101 mmol/L (ref 98–111)
Creatinine: 1 mg/dL (ref 0.61–1.24)
GFR, Est AFR Am: >60 mL/min (ref >60)
GFR, Estimated: >60 mL/min (ref >60)
Glucose, Bld: 87 mg/dL (ref 70–99)
Potassium: 4 mmol/L (ref 3.5–5.1)
Sodium: 140 mmol/L (ref 135–145)

## 2018-12-21 LAB — PROTIME-INR
INR: 1.1 (ref 0.8–1.2)
Prothrombin Time: 13.7 seconds (ref 11.4–15.2)

## 2018-12-21 LAB — MAGNESIUM: Magnesium: 1.9 mg/dL (ref 1.7–2.4)

## 2018-12-22 ENCOUNTER — Ambulatory Visit (HOSPITAL_COMMUNITY)
Admission: RE | Admit: 2018-12-22 | Discharge: 2018-12-22 | Disposition: A | Discharge status: Home / Self Care | Payer: Medicaid Other | Source: Ambulatory Visit | Attending: Hematology | Admitting: Hematology

## 2018-12-22 ENCOUNTER — Inpatient Hospital Stay: Payer: Medicaid Other | Admitting: Nutrition

## 2018-12-22 ENCOUNTER — Other Ambulatory Visit: Payer: Self-pay

## 2018-12-22 ENCOUNTER — Ambulatory Visit
Admission: RE | Admit: 2018-12-22 | Discharge: 2018-12-22 | Disposition: A | Discharge status: Home / Self Care | Payer: Medicaid Other | Source: Ambulatory Visit | Attending: Radiation Oncology | Admitting: Radiation Oncology

## 2018-12-22 ENCOUNTER — Telehealth: Payer: Self-pay | Admitting: *Deleted

## 2018-12-22 ENCOUNTER — Inpatient Hospital Stay: Payer: Medicaid Other

## 2018-12-22 ENCOUNTER — Telehealth: Payer: Self-pay | Admitting: Hematology

## 2018-12-22 VITALS — BP 105/80 | HR 60 | Temp 98.1°F | Resp 17

## 2018-12-22 DIAGNOSIS — C770 Secondary and unspecified malignant neoplasm of lymph nodes of head, face and neck: Secondary | ICD-10-CM | POA: Diagnosis not present

## 2018-12-22 DIAGNOSIS — I825Y1 Chronic embolism and thrombosis of unspecified deep veins of right proximal lower extremity: Secondary | ICD-10-CM | POA: Insufficient documentation

## 2018-12-22 DIAGNOSIS — Z5111 Encounter for antineoplastic chemotherapy: Secondary | ICD-10-CM | POA: Diagnosis not present

## 2018-12-22 DIAGNOSIS — I10 Essential (primary) hypertension: Secondary | ICD-10-CM | POA: Diagnosis not present

## 2018-12-22 DIAGNOSIS — Z87891 Personal history of nicotine dependence: Secondary | ICD-10-CM | POA: Diagnosis not present

## 2018-12-22 DIAGNOSIS — Z86718 Personal history of other venous thrombosis and embolism: Secondary | ICD-10-CM | POA: Diagnosis not present

## 2018-12-22 DIAGNOSIS — Z86711 Personal history of pulmonary embolism: Secondary | ICD-10-CM | POA: Diagnosis not present

## 2018-12-22 DIAGNOSIS — Z7901 Long term (current) use of anticoagulants: Secondary | ICD-10-CM | POA: Diagnosis not present

## 2018-12-22 DIAGNOSIS — C090 Malignant neoplasm of tonsillar fossa: Secondary | ICD-10-CM | POA: Diagnosis not present

## 2018-12-22 DIAGNOSIS — D6859 Other primary thrombophilia: Secondary | ICD-10-CM | POA: Diagnosis not present

## 2018-12-22 DIAGNOSIS — Z79899 Other long term (current) drug therapy: Secondary | ICD-10-CM | POA: Diagnosis not present

## 2018-12-22 DIAGNOSIS — Z510 Encounter for antineoplastic radiation therapy: Secondary | ICD-10-CM | POA: Diagnosis not present

## 2018-12-22 MED ORDER — PALONOSETRON HCL INJECTION 0.25 MG/5ML
0.2500 mg | Freq: Once | INTRAVENOUS | Status: AC
  Administered 2018-12-22: 11:00:00 0.25 mg via INTRAVENOUS

## 2018-12-22 MED ORDER — HEPARIN SOD (PORK) LOCK FLUSH 100 UNIT/ML IV SOLN
500.0000 [IU] | Freq: Once | INTRAVENOUS | Status: AC | PRN
  Administered 2018-12-22: 16:00:00 500 [IU]
  Filled 2018-12-22: qty 5

## 2018-12-22 MED ORDER — PALONOSETRON HCL INJECTION 0.25 MG/5ML
INTRAVENOUS | Status: AC
  Filled 2018-12-22: qty 5

## 2018-12-22 MED ORDER — SODIUM CHLORIDE 0.9 % IV SOLN
Freq: Once | INTRAVENOUS | Status: AC
  Administered 2018-12-22: 11:00:00 via INTRAVENOUS
  Filled 2018-12-22: qty 5

## 2018-12-22 MED ORDER — SODIUM CHLORIDE 0.9 % IV SOLN
30.0000 mg/m2 | Freq: Once | INTRAVENOUS | Status: AC
  Administered 2018-12-22: 13:00:00 75 mg via INTRAVENOUS
  Filled 2018-12-22: qty 75

## 2018-12-22 MED ORDER — SODIUM CHLORIDE 0.9% FLUSH
10.0000 mL | INTRAVENOUS | Status: DC | PRN
  Administered 2018-12-22: 10 mL
  Filled 2018-12-22: qty 10

## 2018-12-22 MED ORDER — SODIUM CHLORIDE 0.9 % IV SOLN
Freq: Once | INTRAVENOUS | Status: AC
  Administered 2018-12-22: 11:00:00 via INTRAVENOUS
  Filled 2018-12-22: qty 250

## 2018-12-22 MED ORDER — POTASSIUM CHLORIDE 2 MEQ/ML IV SOLN
Freq: Once | INTRAVENOUS | Status: AC
  Administered 2018-12-22: 09:00:00 via INTRAVENOUS
  Filled 2018-12-22: qty 10

## 2018-12-22 NOTE — Progress Notes (Signed)
Ok to proceed with Cisplatin with output of 133ml per MD Maylon Peppers

## 2018-12-22 NOTE — Patient Instructions (Signed)
[facility] Discharge Instructions for Patients Receiving Chemotherapy  Today you received the following chemotherapy agents Cisplatin.  To help prevent nausea and vomiting after your treatment, we encourage you to take your nausea medication as directed. BUT NO ZOFRAN FOR 3 DAYS AFTER CHEMO.  If you develop nausea and vomiting that is not controlled by your nausea medication, call the clinic.   BELOW ARE SYMPTOMS THAT SHOULD BE REPORTED IMMEDIATELY:  *FEVER GREATER THAN 100.5 F  *CHILLS WITH OR WITHOUT FEVER  NAUSEA AND VOMITING THAT IS NOT CONTROLLED WITH YOUR NAUSEA MEDICATION  *UNUSUAL SHORTNESS OF BREATH  *UNUSUAL BRUISING OR BLEEDING  TENDERNESS IN MOUTH AND THROAT WITH OR WITHOUT PRESENCE OF ULCERS  *URINARY PROBLEMS  *BOWEL PROBLEMS  UNUSUAL RASH Items with * indicate a potential emergency and should be followed up as soon as possible.  Feel free to call the clinic you have any questions or concerns. The clinic phone number is [phone].  Please show the [place] at check-in to the Emergency Department and triage nurse.

## 2018-12-22 NOTE — Telephone Encounter (Signed)
Scheduled appt per 6/3 los.  Spoke with infusion nurse and they will make sure he get a copy of his new scheduled appts.

## 2018-12-22 NOTE — Progress Notes (Addendum)
Bilateral lower extremity venous duplex completed. Refer to "CV Proc" under chart review to view preliminary results.  Critical results given to [facility], RN who has instructed the patient to be discharged home, and has agreed to call Dr. Maylon Peppers with results. I am unable to contact Dr. Maylon Peppers due to him being offsite today.  12/22/2018 4:35 PM Maudry Mayhew, MHA, RVT, RDCS, RDMS

## 2018-12-22 NOTE — Telephone Encounter (Signed)
Received call from Vascular lab with doppler study done today on the pt. 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 His right posterior tibial artery is patent and has blood flow to that leg at this time. Message sent to Dr. Maylon Peppers

## 2018-12-22 NOTE — Progress Notes (Signed)
RD working remotely.  Nutrition follow up completed with patient receiving treatment for tonsil cancer. Weight decreased and documented as 237.6 pounds. This is a 9% loss in 6 weeks. Continues to drink CIB, milk, gatorade and eat sandwiches. He denies nutrition impact symptoms, Final treatment scheduled for Tuesday, June 9.  Nutrition Diagnosis: Inadequate oral intake continues.  Intervention: Educated patient to increase calories and protein to promote weight stabilization and healing. Mail coupons for [employer].  Monitoring, Evaluation, Goals: Patient will tolerate increased calories and protein while minimizing weight loss.  No follow up scheduled. Patient prefers to contact me.

## 2018-12-23 ENCOUNTER — Telehealth: Payer: Self-pay | Admitting: *Deleted

## 2018-12-23 ENCOUNTER — Other Ambulatory Visit: Payer: Self-pay | Admitting: Hematology

## 2018-12-23 ENCOUNTER — Other Ambulatory Visit: Payer: Self-pay

## 2018-12-23 ENCOUNTER — Ambulatory Visit: Payer: Medicaid Other

## 2018-12-23 ENCOUNTER — Encounter (HOSPITAL_COMMUNITY): Payer: Self-pay | Admitting: Emergency Medicine

## 2018-12-23 ENCOUNTER — Emergency Department (HOSPITAL_COMMUNITY)
Admission: EM | Admit: 2018-12-23 | Discharge: 2018-12-23 | Disposition: A | Discharge status: Home / Self Care | Payer: Medicaid Other | Attending: Emergency Medicine | Admitting: Emergency Medicine

## 2018-12-23 ENCOUNTER — Encounter (HOSPITAL_COMMUNITY): Payer: Medicaid Other

## 2018-12-23 ENCOUNTER — Emergency Department (HOSPITAL_COMMUNITY): Payer: Medicaid Other

## 2018-12-23 DIAGNOSIS — Z03818 Encounter for observation for suspected exposure to other biological agents ruled out: Secondary | ICD-10-CM | POA: Diagnosis not present

## 2018-12-23 DIAGNOSIS — I10 Essential (primary) hypertension: Secondary | ICD-10-CM | POA: Diagnosis not present

## 2018-12-23 DIAGNOSIS — I825Y1 Chronic embolism and thrombosis of unspecified deep veins of right proximal lower extremity: Secondary | ICD-10-CM

## 2018-12-23 DIAGNOSIS — Z8529 Personal history of malignant neoplasm of other respiratory and intrathoracic organs: Secondary | ICD-10-CM | POA: Diagnosis not present

## 2018-12-23 DIAGNOSIS — I82411 Acute embolism and thrombosis of right femoral vein: Secondary | ICD-10-CM | POA: Diagnosis not present

## 2018-12-23 DIAGNOSIS — Z86718 Personal history of other venous thrombosis and embolism: Secondary | ICD-10-CM | POA: Diagnosis not present

## 2018-12-23 DIAGNOSIS — I723 Aneurysm of iliac artery: Secondary | ICD-10-CM | POA: Diagnosis not present

## 2018-12-23 DIAGNOSIS — M79604 Pain in right leg: Secondary | ICD-10-CM | POA: Diagnosis not present

## 2018-12-23 DIAGNOSIS — Z7901 Long term (current) use of anticoagulants: Secondary | ICD-10-CM | POA: Insufficient documentation

## 2018-12-23 DIAGNOSIS — M79661 Pain in right lower leg: Secondary | ICD-10-CM | POA: Diagnosis not present

## 2018-12-23 DIAGNOSIS — I2609 Other pulmonary embolism with acute cor pulmonale: Secondary | ICD-10-CM

## 2018-12-23 DIAGNOSIS — I743 Embolism and thrombosis of arteries of the lower extremities: Secondary | ICD-10-CM | POA: Diagnosis not present

## 2018-12-23 DIAGNOSIS — Z87891 Personal history of nicotine dependence: Secondary | ICD-10-CM | POA: Diagnosis not present

## 2018-12-23 DIAGNOSIS — Z1159 Encounter for screening for other viral diseases: Secondary | ICD-10-CM | POA: Diagnosis not present

## 2018-12-23 DIAGNOSIS — I4891 Unspecified atrial fibrillation: Secondary | ICD-10-CM | POA: Diagnosis not present

## 2018-12-23 LAB — CBC
HCT: 40.2 % (ref 39.0–52.0)
Hemoglobin: 14.3 g/dL (ref 13.0–17.0)
MCH: 32.9 pg (ref 26.0–34.0)
MCHC: 35.6 g/dL (ref 30.0–36.0)
MCV: 92.6 fL (ref 80.0–100.0)
Platelets: 119 10*3/uL — ABNORMAL LOW (ref 150–400)
RBC: 4.34 MIL/uL (ref 4.22–5.81)
RDW: 14.6 % (ref 11.5–15.5)
WBC: 8.5 10*3/uL (ref 4.0–10.5)
nRBC: 0 % (ref 0.0–0.2)

## 2018-12-23 LAB — BASIC METABOLIC PANEL
Anion gap: 9 (ref 5–15)
BUN: 13 mg/dL (ref 6–20)
CO2: 27 mmol/L (ref 22–32)
Calcium: 9.2 mg/dL (ref 8.9–10.3)
Chloride: 102 mmol/L (ref 98–111)
Creatinine, Ser: 0.81 mg/dL (ref 0.61–1.24)
GFR calc Af Amer: >60 mL/min (ref >60)
GFR calc non Af Amer: >60 mL/min (ref >60)
Glucose, Bld: 105 mg/dL — ABNORMAL HIGH (ref 70–99)
Potassium: 4.6 mmol/L (ref 3.5–5.1)
Sodium: 138 mmol/L (ref 135–145)

## 2018-12-23 LAB — SARS CORONAVIRUS 2: SARS Coronavirus 2: NOT DETECTED

## 2018-12-23 LAB — PROTIME-INR
INR: 2.5 — ABNORMAL HIGH (ref 0.8–1.2)
Prothrombin Time: 26.7 seconds — ABNORMAL HIGH (ref 11.4–15.2)

## 2018-12-23 MED ORDER — IOHEXOL 350 MG/ML SOLN
125.0000 mL | Freq: Once | INTRAVENOUS | Status: AC | PRN
  Administered 2018-12-23: 125 mL via INTRAVENOUS

## 2018-12-23 MED ORDER — ENOXAPARIN SODIUM 150 MG/ML ~~LOC~~ SOLN: 1.0000 mg/kg | Freq: Two times a day (BID) | SUBCUTANEOUS | 0 refills | Status: DC

## 2018-12-23 MED ORDER — ELIQUIS 5 MG VTE STARTER PACK: ORAL_TABLET | ORAL | 0 refills | Status: DC

## 2018-12-23 NOTE — ED Notes (Signed)
Patient transported to CT 

## 2018-12-23 NOTE — Telephone Encounter (Signed)
TCT [facility] regarding pt's status and his prescription for Eliquis.  Pt has been approved for free Eliquis through [employer] for 1 year per note from [employer] in [place] dated 12/07/18.  They have not yet received the medication   [name] states they do not have the approval letter or a contact # in order to receive the Eliquis.  VM message left for Lenise as to next steps to obtain the Eliquis.  Per Dr. Maylon Peppers, he has sent in prescription for starter pack of Eliquis to pt's pharmacy in [place].  He is to start the Eliquis 12 hours after last dose of lovenox. Pt is being discharged to home from ED with prescription for lovenox. VM message left for Manual [facility] regarding status of eliquis.

## 2018-12-23 NOTE — ED Triage Notes (Signed)
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 He states he noticed pain in calves Monday. History of same, on coumadin. Denies new numbness/tingling. Skin color red, but he reports this is normal for him. Ambulatory. No shortness of breath or chest pain. Resp e/u.

## 2018-12-23 NOTE — Discharge Instructions (Signed)
You were evaluated in the Emergency Department and after careful evaluation, we did not find any emergent condition requiring admission or further testing in the hospital.  Your symptoms today seem to be due to blood clots in the veins of the leg.  Your CT scan did not show any other more serious conditions.  The vascular surgeons recommend that you start on the Lovenox injections twice daily as prescribed.  You should not take any other blood thinners with this medication.  Please call your regular doctor to discuss your blood thinners and follow-up with vascular surgery.  Please return to the Emergency Department if you experience any worsening of your condition.  We encourage you to follow up with a primary care provider.  Thank you for allowing [place] to be a part of your care.

## 2018-12-23 NOTE — Telephone Encounter (Signed)
Spoke with Dr. Maylon Peppers this morning regarding pt's LE doppler results from late Thursday afternoon, 12/22/18. Reviewed results with him.   Call made to patient to see if has received his Eliquis in the mail yet. He was to receive prescription at no cost.  No answer on identified phone but was able to leave vm message for pt to call back regarding doppler results and his Eliquis.  Received call from Dr. Maylon Peppers. He spoke with Vascular surgeon. Pt is to go to [facility] ED as soon as possible.Vascular surgeon will meet him there. Pt may need surgical intervention for occluded femoral artery.  Dr. Maylon Peppers called pt and pt's significant other to advise of the above. No answer but he was able to leave vm message for pt to go to ED @ [facility]. Will continue to call patient. Call made to [facility] as pt is scheduled for radiation treatment to alert them to the above situation.  Was able to reach patient @ [name] Poe's #  . Explained the above situation to pt. Instructed him to go to [facility] ED now and that vascular surgeon will meet him there. Pt and his girlfriend voiced understanding. He states he will be there within the hour. Dr. Maylon Peppers notified

## 2018-12-23 NOTE — ED Notes (Signed)
Patient verbalizes understanding of discharge instructions. Opportunity for questioning and answers were provided. Pt discharged from ED. 

## 2018-12-23 NOTE — ED Provider Notes (Signed)
[facility] Emergency Department Provider Note MRN:  [id number]  Arrival date & time: 12/23/18     Chief Complaint   DVT   History of Present Illness   [name] [name] is a 58 y.o. year-old male with a history of tonsillar cancer, VTE presenting to the ED with chief complaint of DVT.  Patient has been experiencing increased right leg pain for 1 week.  Received outpatient ultrasounds 2 days ago, was called by his oncologist to come to the emergency department for evaluation by a vascular surgeon.  Pain is mild, constant, worse with palpation.  Denies fever, no cough, chest pain, shortness of breath, no abdominal pain.  Takes Coumadin, doctors trying to transition him to apixaban.  Review of Systems  A complete 10 system review of systems was obtained and all systems are negative except as noted in the HPI and PMH.   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   Primary hypercoagulable state ([place]) 10/01/2014   Sinus bradycardia, chronic 01/04/2012   Superficial thrombosis of lower extremity 05/02/2012    Past Surgical History:  Procedure Laterality Date   DIRECT LARYNGOSCOPY Left 10/19/2018   Procedure: DIRECT LARYNGOSCOPY;  Surgeon: Leta Baptist, MD;  Location: [facility];  Service: ENT;  Laterality: Left;   IR IMAGING GUIDED PORT INSERTION  11/04/2018   TONSILLECTOMY Left 10/19/2018   Procedure: BIOPSY OF LEFT TONSIL;  Surgeon: Leta Baptist, MD;  Location: [place];  Service: ENT;  Laterality: Left;    Family  History  Problem Relation Age of Onset   Stroke Father     Social History   Socioeconomic History   Marital status: Divorced    Spouse name: Not on file   Number of children: 2   Years of education: Not on file   Highest education level: Not on file  Occupational History   Not on file  Social Needs   Financial resource strain: Not on file   Food insecurity:    Worry: Not on file    Inability: Not on file   Transportation needs:    Medical: No    Non-medical: No  Tobacco Use   Smoking status: Former Smoker    Packs/day: 0.50    Years: 30.00    Pack years: 15.00    Types: Cigarettes    Last attempt to quit: 10/19/2018    Years since quitting: 0.1   Smokeless tobacco: Never Used  Substance and Sexual Activity   Alcohol use: Yes    Alcohol/week: 13.0 - 14.0 standard drinks    Types: 12 Cans of beer, 1 - 2 Shots of liquor per week    Comment: 1-2 times per week.   Drug use: No   Sexual activity: Not on file  Lifestyle   Physical activity:    Days per week: Not on file    Minutes per session: Not on file   Stress: Not on file  Relationships   Social connections:    Talks on phone: Not on file    Gets  together: Not on file    Attends religious service: Not on file    Active member of club or organization: Not on file    Attends meetings of clubs or organizations: Not on file    Relationship status: Not on file   Intimate partner violence:    Fear of current or ex partner: No    Emotionally abused: No    Physically abused: No    Forced sexual activity: No  Other Topics Concern   Not on file  Social History Narrative   Not on file     Physical Exam  Vital Signs and Nursing Notes reviewed Vitals:   12/23/18 1430 12/23/18 1445  BP: 115/86 101/67  Pulse:  83  Resp: (!) 23 (!) 21  Temp:    SpO2:  95%    CONSTITUTIONAL: Well-appearing, NAD NEURO:  Alert and oriented x 3, no focal deficits EYES:  eyes equal and reactive ENT/NECK:  no LAD,  no JVD CARDIO: Regular rate, well-perfused, normal S1 and S2 PULM:  CTAB no wheezing or rhonchi GI/GU:  normal bowel sounds, non-distended, non-tender MSK/SPINE:  No gross deformities, no edema; mild erythema to the right leg, decreased DP and PT pulses but normal cap refill to the distal toes SKIN:  no rash, atraumatic PSYCH:  Appropriate speech and behavior  Diagnostic and Interventional Summary    EKG Interpretation  Date/Time:  Friday December 23 2018 11:44:54 EDT Ventricular Rate:  108 PR Interval:    QRS Duration: 122 QT Interval:  345 QTC Calculation: 463 R Axis:   -50 Text Interpretation:  Atrial fibrillation RBBB and LAFB Borderline ST elevation, lateral leads Confirmed by Gerlene Fee [phone]) on 12/23/2018 2:11:46 PM      Labs Reviewed  CBC - Abnormal; Notable for the following components:      Result Value   Platelets 119 (*)    All other components within normal limits  BASIC METABOLIC PANEL - Abnormal; Notable for the following components:   Glucose, Bld 105 (*)    All other components within normal limits  PROTIME-INR - Abnormal; Notable for the following components:   Prothrombin Time 26.7 (*)    INR 2.5 (*)    All other components within normal limits  SARS CORONAVIRUS 2    CT ANGIO AO+BIFEM W & OR WO CONTRAST  Final Result      Medications  iohexol (OMNIPAQUE) 350 MG/ML injection 125 mL (125 mLs Intravenous Contrast Given 12/23/18 1327)     Procedures Critical Care  ED Course and Medical Decision Making  I have reviewed the triage vital signs and the nursing notes.  Pertinent labs & imaging results that were available during my care of the patient were reviewed by me and considered in my medical decision making (see below for details).  Question of right femoral artery thrombus in this 58 year old male who received ultrasound imaging 2 days ago at [facility].  Unable to see this study in our records currently as this was a preliminary read.  We are  thankful for our vascular surgery colleagues who were prompt and seeing the patient here, plan to obtain CT runoff study to determine the acuity or chronicity of this ultrasound finding.  Patient is otherwise with decreased PT and DP pulses in the right foot but with good cap refill, and the decreased pulses is somewhat expected given his known PVD.  Clinical Course as of Dec 22 1501  Fri Dec 23, 2018  1411 Imaging reviewed by Dr.  Cain vascular surgery, who feels the patient has no acute issues and will likely be appropriate for discharge.  Radiology read still pending.  Dr. Donzetta Matters also recommends transition to Lovenox at home.  1 mg/kg every 12 hours.   [MB]    Clinical Course User Index [MB] Maudie Flakes, MD    CT revealing multiple aneurysms that were noted by vascular surgery, nothing to do acutely.  Will provide patient with prescription for Lovenox, advised to stop any other anticoagulation while on Lovenox, patient is therapeutic and has been appropriately bridged with Coumadin for this transition.  Advised close PCP follow-up as well as vascular surgery follow-up to discuss duration of Lovenox and/or transition to apixaban.  After the discussed management above, the patient was determined to be safe for discharge.  The patient was in agreement with this plan and all questions regarding their care were answered.  ED return precautions were discussed and the patient will return to the ED with any significant worsening of condition.  Barth Kirks. Sedonia Small, [place] [email or web address]  Final Clinical Impressions(s) / ED Diagnoses     ICD-10-CM   1. Pain of right lower extremity M79.604     ED Discharge Orders         Ordered    enoxaparin (LOVENOX) 150 MG/ML injection  Every 12 hours     12/23/18 1500             Maudie Flakes, MD 12/23/18 1505

## 2018-12-23 NOTE — Consult Note (Addendum)
Hospital Consult    Reason for Consult: Incidental finding of right femoral artery thrombosis Requesting Physician: ED MRN #:  [id number]  History of Present Illness: This is a 58 y.o. 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  He has stopped his Coumadin several days ago and given new onset bilateral lower extremity pain a bilateral lower extremity venous duplex was ordered by his oncologist.  This demonstrated bilateral acute tibial DVTs and an incidental finding of acute right femoral thrombosis.  He was told to come to the emergency department for evaluation by vascular surgery.  Patient denies any rest pain, change in motor function, or change in sensation of right lower extremity.  He has chronic neuropathy with numbness in bilateral feet however this is unchanged.  He states he did have some pain overnight in his right leg however currently does not describe any pain.  He is a known claudicant after about 200 300 yards.  Patient also states he has been taking 10 mg of Coumadin over the past 2 nights.  Creatinine as of 12/21/2018 was within normal limits.  Past medical history also significant for paroxysmal atrial fibrillation.  Past Medical History:  Diagnosis Date  . Aneurysm artery, popliteal ([place]) 10/01/2014   Right 1st seen 11/14; thrombosed 11/15  . 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 Benign essential HTN 01/04/2012  . Chronic anticoagulation 01/02/2013  . Dermatofibroma of forearm 01/02/2013   Left side  . Hyperlipidemia, mixed 01/04/2012  . 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 Primary hypercoagulable state ([place]) 10/01/2014  . Sinus bradycardia, chronic 01/04/2012  . Superficial thrombosis of lower extremity  05/02/2012    Past Surgical History:  Procedure Laterality Date  . DIRECT LARYNGOSCOPY Left 10/19/2018   Procedure: DIRECT LARYNGOSCOPY;  Surgeon: Leta Baptist, MD;  Location: [facility];  Service: ENT;  Laterality: Left;  . IR IMAGING GUIDED PORT INSERTION  11/04/2018  . TONSILLECTOMY Left 10/19/2018   Procedure: BIOPSY OF LEFT TONSIL;  Surgeon: Leta Baptist, MD;  Location: [facility];  Service: ENT;  Laterality: Left;    No Known Allergies  Prior to Admission medications   Medication Sig Start Date End Date Taking? Authorizing Provider  amLODipine (NORVASC) 10 MG tablet TAKE 1 TABLET BY MOUTH EVERY DAY Patient not taking: Reported on 12/21/2018 04/08/18   Annia Belt, MD  dexamethasone (DECADRON) 4 MG tablet Take 2 tablets by mouth once a day on the day after chemotherapy and then take 2 tablets two times a day for 2 days. Take with food. 10/28/18   Tish Men, MD  fluconazole (DIFLUCAN) 100 MG tablet Take 2 tabs on the first day, and then 1 tab daily until finished 11/30/18   Tish Men, MD  Fluticasone Propionate 0.05 % LOTN Apply 1 application topically 2 (two) times daily. Apply to rash twice daily as needed 11/24/18   Harle Stanford., PA-C  ibuprofen (ADVIL) 100 MG tablet Take 200 mg by mouth as needed for fever.    [provider]  lidocaine (XYLOCAINE) 2 % solution Patient: Mix 1part 2% viscous lidocaine, 1part H20. Swish & swallow 53mL of diluted mixture, 36min before meals and at bedtime, up to QID Patient not taking: Reported on 12/21/2018 11/14/18   Eppie Gibson, MD  lidocaine-prilocaine (  EMLA) cream Apply to affected area once 10/28/18   Tish Men, MD  LORazepam (ATIVAN) 0.5 MG tablet Take 1 tablet (0.5 mg total) by mouth every 6 (six) hours as needed (Nausea or vomiting). Patient not taking: Reported on 12/21/2018 10/28/18   Tish Men, MD  Morphine Sulfate (MORPHINE CONCENTRATE) 10 mg / 0.5 ml concentrated solution Take 0.5 mLs (10 mg total) by mouth every  6 (six) hours as needed for up to 30 days for severe pain. Patient not taking: Reported on 12/21/2018 11/30/18 12/30/18  Tish Men, MD  ondansetron (ZOFRAN) 8 MG tablet Take 1 tablet (8 mg total) by mouth 2 (two) times daily as needed. Start on the third day after chemotherapy. Patient not taking: Reported on 12/07/2018 10/28/18   Tish Men, MD  oxyCODONE-acetaminophen (PERCOCET) 5-325 MG tablet Take 1 tablet by mouth every 4 (four) hours as needed for severe pain. Patient not taking: Reported on 12/21/2018 10/19/18   Leta Baptist, MD  prochlorperazine (COMPAZINE) 10 MG tablet Take 1 tablet (10 mg total) by mouth every 6 (six) hours as needed (Nausea or vomiting). Patient not taking: Reported on 11/30/2018 10/28/18   Tish Men, MD  sodium fluoride (PREVIDENT 5000 PLUS) 1.1 % CREA dental cream Apply to tooth brush. Brush teeth for 2 minutes. Spit out excess. DO NOT rinse afterwards. Repeat nightly. 10/24/18   Lenn Cal, DDS  sucralfate (CARAFATE) 1 g tablet Dissolve 1 tablet in 10 mL H20 and swallow prn soreness, up to QID. Patient not taking: Reported on 12/14/2018 11/14/18   Eppie Gibson, MD  warfarin (COUMADIN) 10 MG tablet Take 5 mg by mouth daily at 6 PM.     [provider]    Social History   Socioeconomic History  . Marital status: Divorced    Spouse name: Not on file  . Number of children: 2  . Years of education: Not on file  . Highest education level: Not on file  Occupational History  . Not on file  Social Needs  . Financial resource strain: Not on file  . Food insecurity:    Worry: Not on file    Inability: Not on file  . Transportation needs:    Medical: No    Non-medical: No  Tobacco Use  . Smoking status: Former Smoker    Packs/day: 0.50    Years: 30.00    Pack years: 15.00    Types: Cigarettes    Last attempt to quit: 10/19/2018    Years since quitting: 0.1  . Smokeless tobacco: Never Used  Substance and Sexual Activity  . Alcohol use: Yes    Alcohol/week: 13.0  - 14.0 standard drinks    Types: 12 Cans of beer, 1 - 2 Shots of liquor per week    Comment: 1-2 times per week.  . Drug use: No  . Sexual activity: Not on file  Lifestyle  . Physical activity:    Days per week: Not on file    Minutes per session: Not on file  . Stress: Not on file  Relationships  . Social connections:    Talks on phone: Not on file    Gets together: Not on file    Attends religious service: Not on file    Active member of club or organization: Not on file    Attends meetings of clubs or organizations: Not on file    Relationship status: Not on file  . Intimate partner violence:    Fear of current or ex  partner: No    Emotionally abused: No    Physically abused: No    Forced sexual activity: No  Other Topics Concern  . Not on file  Social History Narrative  . Not on file     Family History  Problem Relation Age of Onset  . Stroke Father     ROS: Otherwise negative unless mentioned in HPI  Physical Examination  Vitals:   12/23/18 1041  BP: 132/89  Pulse: (!) 122  Resp: (!) 36  Temp: 98.2 F (36.8 C)  SpO2: 95%   Body mass index is 29.62 kg/m.  General:  WDWN in NAD Gait: Not observed HENT: WNL, normocephalic Pulmonary: normal non-labored breathing Cardiac: Irregular Abdomen:  soft, NT/ND, no masses Skin: without rashes Vascular Exam/Pulses: Symmetrical easily palpable femoral pulses; palpable left DP; soft monophasic right AT by Doppler; brisk multiphasic right PTA by Doppler Extremities: without ischemic changes, without Gangrene , without cellulitis; without open wounds;  Musculoskeletal: no muscle wasting or atrophy  Neurologic: A&O X 3;  No focal weakness or paresthesias are detected; speech is fluent/normal Psychiatric:  The pt has Normal affect. Lymph:  Unremarkable  CBC    Component Value Date/Time   WBC 8.6 12/21/2018 1500   WBC 6.4 11/04/2018 1313   RBC 4.99 12/21/2018 1500   HGB 16.3 12/21/2018 1500   HGB 18.7 (H)  10/03/2018 1450   HGB 17.8 (H) 05/21/2014 1228   HCT 47.8 12/21/2018 1500   HCT 52.2 (H) 10/03/2018 1450   HCT 50.9 (H) 05/21/2014 1228   PLT 143 (L) 12/21/2018 1500   PLT 230 10/03/2018 1450   MCV 95.8 12/21/2018 1500   MCV 89 10/03/2018 1450   MCV 90.9 05/21/2014 1228   MCH 32.7 12/21/2018 1500   MCHC 34.1 12/21/2018 1500   RDW 15.1 12/21/2018 1500   RDW 14.0 10/03/2018 1450   RDW 13.6 05/21/2014 1228   LYMPHSABS 0.3 (L) 12/21/2018 1500   LYMPHSABS 1.1 10/03/2018 1450   LYMPHSABS 1.8 05/21/2014 1228   MONOABS 1.1 (H) 12/21/2018 1500   MONOABS 0.6 05/21/2014 1228   EOSABS 0.0 12/21/2018 1500   EOSABS 0.1 10/03/2018 1450   EOSABS 0.2 10/15/2011 0224   EOSABS 0.1 03/22/2009 1236   BASOSABS 0.0 12/21/2018 1500   BASOSABS 0.0 10/03/2018 1450   BASOSABS 0.0 05/21/2014 1228    BMET    Component Value Date/Time   NA 140 12/21/2018 1500   NA 142 10/03/2018 1450   NA 141 05/21/2014 1226   K 4.0 12/21/2018 1500   K 4.3 05/21/2014 1226   CL 101 12/21/2018 1500   CL 108 (H) 12/28/2012 1405   CO2 28 12/21/2018 1500   CO2 28 05/21/2014 1226   GLUCOSE 87 12/21/2018 1500   GLUCOSE 101 05/21/2014 1226   GLUCOSE 95 12/28/2012 1405   BUN 21 (H) 12/21/2018 1500   BUN 15 10/03/2018 1450   BUN 13.2 05/21/2014 1226   CREATININE 1.00 12/21/2018 1500   CREATININE 1.00 09/24/2014 1413   CREATININE 0.9 05/21/2014 1226   CALCIUM 9.2 12/21/2018 1500   CALCIUM 9.3 05/21/2014 1226   GFRNONAA >60 12/21/2018 1500   GFRNONAA >60 10/15/2011 0224   GFRAA >60 12/21/2018 1500   GFRAA >60 10/15/2011 0224    COAGS: Lab Results  Component Value Date   INR 1.1 12/21/2018   INR 3.4 (H) 12/14/2018   INR 1.8 (H) 12/07/2018   PROTIME 28.8 (H) 02/04/2015   PROTIME 28.8 (H) 12/10/2014   PROTIME 36.0 (H) 08/24/2014  Non-Invasive Vascular Imaging:   Venous duplex demonstrating bilateral acute tibial vein DVTs; incidental finding of acute femoral thrombus right lower extremity     ASSESSMENT/PLAN: This is a 58 y.o. male with incidental finding of acute femoral artery thrombus right lower extremity  Patient is known to [facility] having been evaluated in the past for occluded right popliteal aneurysm and known history of claudication of right leg.  On exam patient seems to be perfusing his right leg well with motor and sensation at baseline and does not describe any rest pain.  Patien as well.  Dagoberto Ligas PA-C [facility] [phone]  I have independently interviewed and examined patient and agree with PA assessment and plan above.  Has an occluded right popliteal aneurysm.  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  Also has a right common iliac artery aneurysm and possible has a left-sided popliteal aneurysm as well it was previously 1.7 cm on duplex.  I will have him follow-up in a few months with lower extremity duplexes bilaterally.  He will get a prescription for Lovenox today although unsure if he will feel it or not.  He has been approved for free Eliquis for his DVTs.  Hopefully when we see him back you have completed chemotherapy.  Chauntay Paszkiewicz C. Donzetta Matters, MD [facility] Office: [phone] Pager: [phone]

## 2018-12-26 ENCOUNTER — Ambulatory Visit: Payer: Medicaid Other

## 2018-12-26 ENCOUNTER — Ambulatory Visit
Admission: RE | Admit: 2018-12-26 | Discharge: 2018-12-26 | Disposition: A | Discharge status: Home / Self Care | Payer: Medicaid Other | Source: Ambulatory Visit | Attending: Radiation Oncology | Admitting: Radiation Oncology

## 2018-12-26 ENCOUNTER — Other Ambulatory Visit: Payer: Self-pay

## 2018-12-26 ENCOUNTER — Encounter: Payer: Self-pay | Admitting: Hematology

## 2018-12-26 DIAGNOSIS — D6859 Other primary thrombophilia: Secondary | ICD-10-CM | POA: Diagnosis not present

## 2018-12-26 DIAGNOSIS — Z86718 Personal history of other venous thrombosis and embolism: Secondary | ICD-10-CM | POA: Diagnosis not present

## 2018-12-26 DIAGNOSIS — C770 Secondary and unspecified malignant neoplasm of lymph nodes of head, face and neck: Secondary | ICD-10-CM | POA: Diagnosis not present

## 2018-12-26 DIAGNOSIS — Z7901 Long term (current) use of anticoagulants: Secondary | ICD-10-CM | POA: Diagnosis not present

## 2018-12-26 DIAGNOSIS — C090 Malignant neoplasm of tonsillar fossa: Secondary | ICD-10-CM | POA: Diagnosis not present

## 2018-12-26 DIAGNOSIS — I10 Essential (primary) hypertension: Secondary | ICD-10-CM | POA: Diagnosis not present

## 2018-12-26 DIAGNOSIS — Z87891 Personal history of nicotine dependence: Secondary | ICD-10-CM | POA: Diagnosis not present

## 2018-12-26 DIAGNOSIS — Z86711 Personal history of pulmonary embolism: Secondary | ICD-10-CM | POA: Diagnosis not present

## 2018-12-26 DIAGNOSIS — Z79899 Other long term (current) drug therapy: Secondary | ICD-10-CM | POA: Diagnosis not present

## 2018-12-26 DIAGNOSIS — Z510 Encounter for antineoplastic radiation therapy: Secondary | ICD-10-CM | POA: Diagnosis not present

## 2018-12-26 NOTE — Progress Notes (Signed)
Rcvd staff msg and vm from Dr. Maylon Peppers and his nurse stating pt still hasn't received his Eliquis.  I informed them I talked with Mr. Nguyen weeks ago and let him know to call [employer] to schedule delivery of his medication, I gave him the number to their pharmacy.  [employer] informed me Mr. Murakami should've received a call from them the Tuesday after he was approved which was 11/30/18 to schedule delivery.  I called pt's significant other, Miss Manual Meier and informed her of the same thing.  I will email the [employer] approval letter to Mr. Verdi so he can call [employer] to schedule delivery.

## 2018-12-26 NOTE — Telephone Encounter (Signed)
I spoke with Lenise previously regarding this, and she had mentioned that she gave the patient number to call the pharmaceutical company as well as the approval #. I had spoken with Mr. Gurka on several occasions, and he indicated that he had called the company to request medication and that it was being sent to him, but never brought up the questions of having no number to call or approval letter.   Lenise, would you mind reaching out to the patient's partner and give her the number as well as the approval # so that they can call again?  Thanks.  Dr. Maylon Peppers

## 2018-12-26 NOTE — Progress Notes (Signed)
Oncology Nurse Navigator Documentation  Met with Mr. [name] [name] prior to RT appt to check on his well-being. He reported:  Doing well, throat pain tolerable.  Able to eat/dtink orally, does not think he is going to need feeding tube.  He recognized next Tuesday is his final RT, might have final chemo this week. I encouraged him to to call me as needed.  He agreed.  Gayleen Orem, RN, BSN [facility] Ne[facility] Oncology Nurse [facility] [phone]

## 2018-12-27 ENCOUNTER — Ambulatory Visit: Payer: Medicaid Other

## 2018-12-27 ENCOUNTER — Ambulatory Visit
Admission: RE | Admit: 2018-12-27 | Discharge: 2018-12-27 | Disposition: A | Discharge status: Home / Self Care | Payer: Medicaid Other | Source: Ambulatory Visit | Attending: Radiation Oncology | Admitting: Radiation Oncology

## 2018-12-27 ENCOUNTER — Other Ambulatory Visit: Payer: Self-pay

## 2018-12-27 DIAGNOSIS — Z87891 Personal history of nicotine dependence: Secondary | ICD-10-CM | POA: Diagnosis not present

## 2018-12-27 DIAGNOSIS — C090 Malignant neoplasm of tonsillar fossa: Secondary | ICD-10-CM | POA: Diagnosis not present

## 2018-12-27 DIAGNOSIS — I10 Essential (primary) hypertension: Secondary | ICD-10-CM | POA: Diagnosis not present

## 2018-12-27 DIAGNOSIS — Z86718 Personal history of other venous thrombosis and embolism: Secondary | ICD-10-CM | POA: Diagnosis not present

## 2018-12-27 DIAGNOSIS — D6859 Other primary thrombophilia: Secondary | ICD-10-CM | POA: Diagnosis not present

## 2018-12-27 DIAGNOSIS — C770 Secondary and unspecified malignant neoplasm of lymph nodes of head, face and neck: Secondary | ICD-10-CM | POA: Diagnosis not present

## 2018-12-27 DIAGNOSIS — Z7901 Long term (current) use of anticoagulants: Secondary | ICD-10-CM | POA: Diagnosis not present

## 2018-12-27 DIAGNOSIS — Z510 Encounter for antineoplastic radiation therapy: Secondary | ICD-10-CM | POA: Diagnosis not present

## 2018-12-27 DIAGNOSIS — Z79899 Other long term (current) drug therapy: Secondary | ICD-10-CM | POA: Diagnosis not present

## 2018-12-27 DIAGNOSIS — Z86711 Personal history of pulmonary embolism: Secondary | ICD-10-CM | POA: Diagnosis not present

## 2018-12-28 ENCOUNTER — Inpatient Hospital Stay (HOSPITAL_BASED_OUTPATIENT_CLINIC_OR_DEPARTMENT_OTHER): Payer: Medicaid Other | Admitting: Hematology

## 2018-12-28 ENCOUNTER — Other Ambulatory Visit: Payer: Medicaid Other

## 2018-12-28 ENCOUNTER — Ambulatory Visit
Admission: RE | Admit: 2018-12-28 | Discharge: 2018-12-28 | Disposition: A | Discharge status: Home / Self Care | Payer: Medicaid Other | Source: Ambulatory Visit | Attending: Radiation Oncology | Admitting: Radiation Oncology

## 2018-12-28 ENCOUNTER — Encounter: Payer: Self-pay | Admitting: Radiation Oncology

## 2018-12-28 ENCOUNTER — Inpatient Hospital Stay: Payer: Medicaid Other

## 2018-12-28 ENCOUNTER — Encounter: Payer: Self-pay | Admitting: Hematology

## 2018-12-28 ENCOUNTER — Ambulatory Visit: Payer: Medicaid Other | Admitting: Hematology

## 2018-12-28 ENCOUNTER — Other Ambulatory Visit: Payer: Self-pay

## 2018-12-28 VITALS — BP 124/80 | HR 77 | Temp 97.4°F | Resp 18 | Ht 75.0 in | Wt 238.3 lb

## 2018-12-28 DIAGNOSIS — Z86711 Personal history of pulmonary embolism: Secondary | ICD-10-CM

## 2018-12-28 DIAGNOSIS — C770 Secondary and unspecified malignant neoplasm of lymph nodes of head, face and neck: Secondary | ICD-10-CM | POA: Diagnosis not present

## 2018-12-28 DIAGNOSIS — L309 Dermatitis, unspecified: Secondary | ICD-10-CM

## 2018-12-28 DIAGNOSIS — Z510 Encounter for antineoplastic radiation therapy: Secondary | ICD-10-CM | POA: Diagnosis not present

## 2018-12-28 DIAGNOSIS — Z5111 Encounter for antineoplastic chemotherapy: Secondary | ICD-10-CM | POA: Diagnosis not present

## 2018-12-28 DIAGNOSIS — T451X5A Adverse effect of antineoplastic and immunosuppressive drugs, initial encounter: Secondary | ICD-10-CM | POA: Diagnosis not present

## 2018-12-28 DIAGNOSIS — Z79899 Other long term (current) drug therapy: Secondary | ICD-10-CM | POA: Diagnosis not present

## 2018-12-28 DIAGNOSIS — I10 Essential (primary) hypertension: Secondary | ICD-10-CM | POA: Diagnosis not present

## 2018-12-28 DIAGNOSIS — D6959 Other secondary thrombocytopenia: Secondary | ICD-10-CM

## 2018-12-28 DIAGNOSIS — Z87891 Personal history of nicotine dependence: Secondary | ICD-10-CM | POA: Diagnosis not present

## 2018-12-28 DIAGNOSIS — Z86718 Personal history of other venous thrombosis and embolism: Secondary | ICD-10-CM | POA: Diagnosis not present

## 2018-12-28 DIAGNOSIS — D6859 Other primary thrombophilia: Secondary | ICD-10-CM | POA: Diagnosis not present

## 2018-12-28 DIAGNOSIS — R21 Rash and other nonspecific skin eruption: Secondary | ICD-10-CM

## 2018-12-28 DIAGNOSIS — C090 Malignant neoplasm of tonsillar fossa: Secondary | ICD-10-CM | POA: Diagnosis not present

## 2018-12-28 DIAGNOSIS — Z7901 Long term (current) use of anticoagulants: Secondary | ICD-10-CM

## 2018-12-28 DIAGNOSIS — B370 Candidal stomatitis: Secondary | ICD-10-CM

## 2018-12-28 DIAGNOSIS — E46 Unspecified protein-calorie malnutrition: Secondary | ICD-10-CM | POA: Diagnosis not present

## 2018-12-28 DIAGNOSIS — I825Y1 Chronic embolism and thrombosis of unspecified deep veins of right proximal lower extremity: Secondary | ICD-10-CM

## 2018-12-28 DIAGNOSIS — I2609 Other pulmonary embolism with acute cor pulmonale: Secondary | ICD-10-CM

## 2018-12-28 DIAGNOSIS — K1231 Oral mucositis (ulcerative) due to antineoplastic therapy: Secondary | ICD-10-CM | POA: Diagnosis not present

## 2018-12-28 LAB — CBC WITH DIFFERENTIAL (CANCER CENTER ONLY)
Abs Immature Granulocytes: 0.06 10*3/uL (ref 0.00–0.07)
Basophils Absolute: 0 10*3/uL (ref 0.0–0.1)
Basophils Relative: 0 %
Eosinophils Absolute: 0 10*3/uL (ref 0.0–0.5)
Eosinophils Relative: 0 %
HCT: 44.1 % (ref 39.0–52.0)
Hemoglobin: 15.1 g/dL (ref 13.0–17.0)
Immature Granulocytes: 1 %
Lymphocytes Relative: 4 %
Lymphs Abs: 0.3 10*3/uL — ABNORMAL LOW (ref 0.7–4.0)
MCH: 33 pg (ref 26.0–34.0)
MCHC: 34.2 g/dL (ref 30.0–36.0)
MCV: 96.3 fL (ref 80.0–100.0)
Monocytes Absolute: 1 10*3/uL (ref 0.1–1.0)
Monocytes Relative: 14 %
Neutro Abs: 5.7 10*3/uL (ref 1.7–7.7)
Neutrophils Relative %: 81 %
Platelet Count: 136 10*3/uL — ABNORMAL LOW (ref 150–400)
RBC: 4.58 MIL/uL (ref 4.22–5.81)
RDW: 15.9 % — ABNORMAL HIGH (ref 11.5–15.5)
WBC Count: 7 10*3/uL (ref 4.0–10.5)
nRBC: 0 % (ref 0.0–0.2)

## 2018-12-28 LAB — BASIC METABOLIC PANEL - CANCER CENTER ONLY
Anion gap: 12 (ref 5–15)
BUN: 19 mg/dL (ref 6–20)
CO2: 26 mmol/L (ref 22–32)
Calcium: 9 mg/dL (ref 8.9–10.3)
Chloride: 101 mmol/L (ref 98–111)
Creatinine: 0.84 mg/dL (ref 0.61–1.24)
GFR, Est AFR Am: >60 mL/min (ref >60)
GFR, Estimated: >60 mL/min (ref >60)
Glucose, Bld: 79 mg/dL (ref 70–99)
Potassium: 3.9 mmol/L (ref 3.5–5.1)
Sodium: 139 mmol/L (ref 135–145)

## 2018-12-28 LAB — PROTIME-INR
INR: 1 (ref 0.8–1.2)
Prothrombin Time: 12.5 seconds (ref 11.4–15.2)

## 2018-12-28 LAB — MAGNESIUM: Magnesium: 1.7 mg/dL (ref 1.7–2.4)

## 2018-12-28 MED ORDER — FLUCONAZOLE 100 MG PO TABS: 100.0000 mg | ORAL_TABLET | Freq: Every day | ORAL | 0 refills | Status: AC

## 2018-12-28 MED ORDER — FLUTICASONE PROPIONATE 0.05 % EX LOTN: 1.0000 "application " | TOPICAL_LOTION | Freq: Two times a day (BID) | CUTANEOUS | 1 refills | Status: DC

## 2018-12-28 NOTE — Progress Notes (Signed)
[name] OFFICE PROGRESS NOTE  Patient Care Team: Default, Provider, MD as PCP - General [name] [name], [name] Locket, MD as Consulting Physician (Oncology)  HEME/ONC OVERVIEW: 1. 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 Melene Plan, invasive SCCa, p16+; not a candidate for TORS per ENT  -Late 10/2018 - present: definitive chemoradiation with weekly cisplatin   2. 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 -04/2017: no DVT in the RLE but an age-indeterminate superficial vein thrombosis involving the R greater saphenous vein  -12/2018: acute DVT involving R peroneal vein and L gastrocnemius vein; new arterial thrombosis involving R common femoral artery and popliteal arteries, due to artery aneurysm  3. Port placed in 10/2018; PEG not yet scheduled   TREATMENT REGIMEN:  Early 2000 - present: warfarin, questionable compliance   11/08/2018 - present: definitive chemoradiation with weekly cisplatin   PERTINENT NON-HEM/ONC PROBLEMS: 1. Tobacco and EtOH abuse 2. Paroxysmal A-fib 3. 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, p16+  -S/p 5 cycles of weekly cisplatin concurrent with RT in the definitive setting -Treatment delayed and dose reduced to 30mg /m2 due to prolonged cytopenias  -Patient declined feeding tube -Labs adequate today,  proceed with Dose #6 (and the final cycle) of weekly cisplatin -RT completed on 12/27/2018 -PRN anti-emetics: Zofran, Compazine, Ativan and dexamethasone   Chemotherapy-associated thrombocytopenia -Secondary to chemotherapy -Plts 136k, stable -Patient denies any symptoms of bleeding or excess bruising, such as epistaxis, hematochezia, melena, or hematuria -We will monitor for now  Chemotherapy-associated mucositis -Secondary to chemotherapy -Currently on liquid IR morphine 10mg /0.67mL q4hrs PRN as well as occasional ibuprofen -Pain tolerated; patient not requiring opioid or NSAIDs -Patient was counseled on the importance of avoiding excess NSAID use due to the risk of nephrotoxicity and masking fever   Recurrent DVT's and PTE -Doppler in 12/2018 showed acute bilateral lower extremity DVT and incidental right lower extremity arterial thrombosis, likely due to aneurysms -No surgical intervention indicated per vascular surgery -Patient has been taking warfarin only sporadically; he was approved for Eliquis by [employer] but has not yet received the medication, unclear if the patient did not set up the delivery or the medication was delayed -Currently on Lovenox 1mg /kg BID -I have prescribed Eliquis starter pack, which the patient can start 12 hours after the last dose of Lovenox, but he still has not yet filled his prescription  -I have also had several conversations with financial aid office to ensure that patient has number to call the pharmaceutical company so that he can call and set up Eliquis delivery ASAP  -Continue follow-up with vascular surgery as scheduled  -I emphasized with the patient that before he starts his Eliquis, he must call the clinic for further instruction   Oral candidiasis -Secondary to chemoradiation -Not compliant with fluconazole -I counseled the patient on the importance of medication adherence, and have prescribed one more week  of fluconazole due to  some persistent oral candiasis   Protein malnutrition -Secondary to chemoradiation -Weight stable since last week -No feeding tube due to patient preference -I encouraged the patient to maintain adequate PO intake as tolerated and to adhere to the nutritional recommendations  Upper extremity rash -Possibly due to chemotherapy -Overall unchanged, confined to the upper extremities -Continue PRN fluticasone cream for now  No orders of the defined types were placed in this encounter.  All questions were answered. The patient knows to call the clinic with any problems, questions or concerns. No barriers to learning was detected.  Return in 1 week for labs, port flush and clinic appt.   [name] Men, MD 12/28/2018 3:09 PM  CHIEF COMPLAINT: "I still haven't gotten my Eliquis yet"  INTERVAL HISTORY: [name] [name] returns to clinic for follow-up of squamous of carcinoma of the tonsil on definitive chemoradiation.  Patient reports that since discharge from the ER, he has been doing well with Lovenox injection twice a day, and denies any abnormal bleeding or bruising.  He has not resumed warfarin.  He was prescribed Eliquis starter pack to his [facility], but apparently his pharmacy did not have it in stock, and he has not yet filled the prescription.  He also contacted the [employer] for his free Eliquis medication, but the medication has not yet been delivered to his house but likely sometime this week.  His leg pain is overall improving.  He still has some sore throat, but has not had to take any morphine or ibuprofen.  He is able to eat and drink mostly without limitation, including meatballs, spaghetti noodles, shrimps, as well as two Carnation instant breakfast per day, a quart of milk and about 24 ounces water per day.  He denies any significant nausea.  He still takes his fluconazole sporadically, and he still has a few tabs remaining.  SUMMARY OF ONCOLOGIC HISTORY:   Malignant neoplasm  of tonsillar fossa ([place])   10/10/2018 Imaging    CT neck w/ contrast: FINDINGS: Pharynx and larynx: There is asymmetric prominence of the left tonsillar region. No other mucosal or submucosal lesion is seen.  Salivary glands: Parotid and submandibular glands are normal.  Thyroid: Normal  Lymph nodes: Definite adenopathy on the left. 2 level nodes which shows some internal necrosis. The larger measures 2.2 cm in diameter and the smaller measures 1.4 cm in diameter. Suspicious level 4 node image 83 just anterior to the jugular vein measuring 9 mm short axis diameter. No worrisome nodes on the right.    10/10/2018 Imaging    CT chest:  IMPRESSION: 1. Mild right hilar adenopathy, equivocal for metastasis. No additional potential evidence of metastatic disease in the chest. PET-CT could be considered for further staging evaluation, as clinically warranted. 2. 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 Ascending thoracic aortic aneurysm. Recommend semi-annual imaging followup by CTA or MRA and referral to cardiothoracic surgery if not already obtained. This recommendation follows 2010 ACCF/AHA/AATS/ACR/ASA/SCA/SCAI/SIR/STS/SVM Guidelines for the Diagnosis and Management of Patients With Thoracic Aortic Disease. Circulation. 2010; 121: X914-N829. Aortic aneurysm NOS (ICD10-I71.9).    10/17/2018 Initial Diagnosis    Tonsil cancer ([place])    10/18/2018 Imaging    PET: IMPRESSION: 1. 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 2. Local hypermetabolic nodal metastasis to the LEFT level II nodal station. 3. No contralateral nodal metastasis. 4. No evidence distant metastatic disease. 5. Two small pulmonary nodules in the LEFT lower lobe are favored benign. Recommend attention  on follow-up. 6. Several small common iliac lymph nodes with low metabolic activity. Favor reactive adenopathy.    10/21/2018 Cancer Staging    Staging form: Pharynx -  HPV-Mediated Oropharynx, AJCC 8th Edition - Clinical: Stage I (cT2, cN1, cM0, p16+) - Signed by Eppie Gibson, MD on 10/21/2018    11/10/2018 -  Chemotherapy    The patient had palonosetron (ALOXI) injection 0.25 mg, 0.25 mg, Intravenous,  Once, 5 of 7 cycles Administration: 0.25 mg (11/10/2018), 0.25 mg (11/17/2018), 0.25 mg (12/22/2018), 0.25 mg (11/24/2018), 0.25 mg (12/15/2018) CISplatin (PLATINOL) 100 mg in sodium chloride 0.9 % 500 mL chemo infusion, 40 mg/m2 = 100 mg, Intravenous,  Once, 5 of 7 cycles Dose modification: 30 mg/m2 (original dose 40 mg/m2, Cycle 5, Reason: Dose not tolerated) Administration: 100 mg (11/10/2018), 100 mg (11/17/2018), 75 mg (12/22/2018), 100 mg (11/24/2018), 75 mg (12/15/2018) fosaprepitant (EMEND) 150 mg, dexamethasone (DECADRON) 12 mg in sodium chloride 0.9 % 145 mL IVPB, , Intravenous,  Once, 5 of 7 cycles Administration:  (11/10/2018),  (11/17/2018),  (12/22/2018),  (11/24/2018),  (12/15/2018)  for chemotherapy treatment.      REVIEW OF SYSTEMS:   Constitutional: ( - ) fevers, ( - )  chills , ( - ) night sweats Eyes: ( - ) blurriness of vision, ( - ) double vision, ( - ) watery eyes Ears, nose, mouth, throat, and face: ( + ) mucositis, ( - ) sore throat Respiratory: ( - ) cough, ( - ) dyspnea, ( - ) wheezes Cardiovascular: ( - ) palpitation, ( - ) chest discomfort, ( - ) lower extremity swelling Gastrointestinal:  ( - ) nausea, ( - ) heartburn, ( - ) change in bowel habits Skin: ( + ) abnormal skin rashes Lymphatics: ( - ) new lymphadenopathy, ( - ) easy bruising Neurological: ( - ) numbness, ( - ) tingling, ( - ) new weaknesses Behavioral/Psych: ( - ) mood change, ( - ) new changes  All other systems were reviewed with the patient and are negative.  I have reviewed the past medical history, past surgical history, social history and family history with the patient and they are unchanged from previous note.  ALLERGIES:  has No Known Allergies.  MEDICATIONS:  Current  Outpatient Medications  Medication Sig Dispense Refill  . amLODipine (NORVASC) 10 MG tablet TAKE 1 TABLET BY MOUTH EVERY DAY (Patient taking differently: Take 10 mg by mouth daily. ) 90 tablet 2  . enoxaparin (LOVENOX) 150 MG/ML injection Inject 0.72 mLs (110 mg total) into the skin every 12 (twelve) hours for 14 days. 28 Syringe 0  . Fluticasone Propionate 0.05 % LOTN Apply 1 application topically 2 (two) times daily. Apply to rash twice daily as needed (Patient taking differently: Apply 1 application topically 2 (two) times daily as needed (rash). ) 120 mL 1  . ibuprofen (ADVIL) 200 MG tablet Take 600 mg by mouth every 6 (six) hours as needed for pain or fever.     . sodium fluoride (PREVIDENT 5000 PLUS) 1.1 % CREA dental cream Apply to tooth brush. Brush teeth for 2 minutes. Spit out excess. DO NOT rinse afterwards. Repeat nightly. 1 Tube prn  . dexamethasone (DECADRON) 4 MG tablet Take 2 tablets by mouth once a day on the day after chemotherapy and then take 2 tablets two times a day for 2 days. Take with food. (Patient not taking: Reported on 12/28/2018) 30 tablet 1  . Eliquis DVT/PE Starter Pack (ELIQUIS STARTER PACK) 5 MG TABS Take  as directed on package: start with two-5mg  tablets twice daily for 7 days. On day 8, switch to one-5mg  tablet twice daily. (Patient not taking: Reported on 12/28/2018) 1 each 0  . fluconazole (DIFLUCAN) 100 MG tablet Take 1 tablet (100 mg total) by mouth daily for 7 days. 7 tablet 0   No current facility-administered medications for this visit.    Facility-Administered Medications Ordered in Other Visits  Medication Dose Route Frequency Provider Last Rate Last Dose  . heparin lock flush 100 unit/mL  500 Units Intravenous Once [name] Men, MD      . sodium chloride flush (NS) 0.9 % injection 10 mL  10 mL Intravenous PRN [name] Men, MD        PHYSICAL EXAMINATION: ECOG PERFORMANCE STATUS: 1 - Symptomatic but completely ambulatory  Today's Vitals   12/28/18 1430  12/28/18 1448  BP: 124/80   Pulse: 77   Resp: 18   Temp: (!) 97.4 F (36.3 C)   TempSrc: Oral   SpO2: 100%   Weight: 238 lb 4.8 oz (108.1 kg)   Height: 6\' 3"  (1.905 m)   PainSc:  3    Body mass index is 29.79 kg/m.  Filed Weights   12/28/18 1430  Weight: 238 lb 4.8 oz (108.1 kg)    GENERAL: alert, no distress and comfortable SKIN: darkening of skin around the neck, a few scattered erythematous papules over bilateral upper extremities, unchanged  EYES: conjunctiva are pink and non-injected, sclera clear OROPHARYNX: Grade 1 mucositis, some residual oral candidiasis NECK: supple, non-tender LYMPH:  no palpable lymphadenopathy in the cervical LUNGS: clear to auscultation with normal breathing effort HEART: regular rate & rhythm and no murmurs and no lower extremity edema ABDOMEN: soft, non-tender, non-distended, normal bowel sounds Musculoskeletal: no cyanosis of digits and no clubbing  PSYCH: alert & oriented x 3, fluent speech NEURO: no focal motor/sensory deficits  LABORATORY DATA:  I have reviewed the data as listed    Component Value Date/Time   NA 139 12/28/2018 1421   NA 142 10/03/2018 1450   NA 141 05/21/2014 1226   K 3.9 12/28/2018 1421   K 4.3 05/21/2014 1226   CL 101 12/28/2018 1421   CL 108 (H) 12/28/2012 1405   CO2 26 12/28/2018 1421   CO2 28 05/21/2014 1226   GLUCOSE 79 12/28/2018 1421   GLUCOSE 101 05/21/2014 1226   GLUCOSE 95 12/28/2012 1405   BUN 19 12/28/2018 1421   BUN 15 10/03/2018 1450   BUN 13.2 05/21/2014 1226   CREATININE 0.84 12/28/2018 1421   CREATININE 1.00 09/24/2014 1413   CREATININE 0.9 05/21/2014 1226   CALCIUM 9.0 12/28/2018 1421   CALCIUM 9.3 05/21/2014 1226   PROT 7.4 10/21/2018 1148   PROT 7.2 10/03/2018 1450   PROT 7.1 01/08/2014 1203   ALBUMIN 4.4 10/21/2018 1148   ALBUMIN 4.3 10/03/2018 1450   ALBUMIN 3.7 01/08/2014 1203   AST 20 10/21/2018 1148   AST 50 (H) 01/08/2014 1203   ALT 19 10/21/2018 1148   ALT 83 (H)  01/08/2014 1203   ALKPHOS 73 10/21/2018 1148   ALKPHOS 87 01/08/2014 1203   BILITOT 0.6 10/21/2018 1148   BILITOT 0.67 01/08/2014 1203   GFRNONAA >60 12/28/2018 1421   GFRNONAA >60 10/15/2011 0224   GFRAA >60 12/28/2018 1421   GFRAA >60 10/15/2011 0224    No results found for: SPEP, UPEP  Lab Results  Component Value Date   WBC 7.0 12/28/2018   NEUTROABS 5.7 12/28/2018  HGB 15.1 12/28/2018   HCT 44.1 12/28/2018   MCV 96.3 12/28/2018   PLT 136 (L) 12/28/2018      Chemistry      Component Value Date/Time   NA 139 12/28/2018 1421   NA 142 10/03/2018 1450   NA 141 05/21/2014 1226   K 3.9 12/28/2018 1421   K 4.3 05/21/2014 1226   CL 101 12/28/2018 1421   CL 108 (H) 12/28/2012 1405   CO2 26 12/28/2018 1421   CO2 28 05/21/2014 1226   BUN 19 12/28/2018 1421   BUN 15 10/03/2018 1450   BUN 13.2 05/21/2014 1226   CREATININE 0.84 12/28/2018 1421   CREATININE 1.00 09/24/2014 1413   CREATININE 0.9 05/21/2014 1226      Component Value Date/Time   CALCIUM 9.0 12/28/2018 1421   CALCIUM 9.3 05/21/2014 1226   ALKPHOS 73 10/21/2018 1148   ALKPHOS 87 01/08/2014 1203   AST 20 10/21/2018 1148   AST 50 (H) 01/08/2014 1203   ALT 19 10/21/2018 1148   ALT 83 (H) 01/08/2014 1203   BILITOT 0.6 10/21/2018 1148   BILITOT 0.67 01/08/2014 1203       RADIOGRAPHIC STUDIES: I have personally reviewed the radiological images as listed below and agreed with the findings in the report. Ct Soft Tissue Neck W Contrast  Result Date: 12/14/2018 CLINICAL DATA:  Left neck swelling. Currently on chemo radiation. Previously demonstrated left tonsillar mass. EXAM: CT NECK WITH CONTRAST TECHNIQUE: Multidetector CT imaging of the neck was performed using the standard protocol following the bolus administration of intravenous contrast. CONTRAST:  49mL OMNIPAQUE IOHEXOL 300 MG/ML  SOLN COMPARISON:  CT neck 10/10/2018 and PET CT 10/18/2018 FINDINGS: PHARYNX AND LARYNX: --Nasopharynx: Fossae of Rosenmuller  are clear. Normal adenoid tonsils for age. --Oral cavity and oropharynx: Previously demonstrated fullness of the left lingual tonsil has decreased. No discrete mass. No new lesion. --Hypopharynx: Normal vallecula and pyriform sinuses. --Larynx: Normal epiglottis and pre-epiglottic space. Normal aryepiglottic and vocal folds. --Retropharyngeal space: No abscess, effusion or lymphadenopathy. SALIVARY GLANDS: --Parotid: No mass lesion or inflammation. No sialolithiasis or ductal dilatation. --Submandibular: Symmetric without inflammation. No sialolithiasis or ductal dilatation. --Sublingual: Normal. No ranula or other visible lesion of the base of tongue and floor of mouth. THYROID: Normal. LYMPH NODES: No enlarged or abnormal density lymph nodes. Previously demonstrated abnormal lymph nodes along the left cervical chain have resolved. No new enlarged or abnormal density nodes. VASCULAR: Major cervical vessels are patent. LIMITED INTRACRANIAL: Normal. VISUALIZED ORBITS: Normal. MASTOIDS AND VISUALIZED PARANASAL SINUSES: No fluid levels or advanced mucosal thickening. No mastoid effusion. SKELETON: No bony spinal canal stenosis. No lytic or blastic lesions. UPPER CHEST: Clear. OTHER: None. IMPRESSION: 1. No acute abnormality of the cervical soft tissues. No new soft tissue mass. 2. Markedly decreased fullness at the left lingual tonsillar site identified on PET CT. 3. Resolution of left cervical lymphadenopathy. Electronically Signed   By: Ulyses Jarred M.D.   On: 12/14/2018 19:05   Ct Angio Ao+bifem W & Or Wo Contrast  Result Date: 12/23/2018 CLINICAL DATA:  58 year old with lower extremity DVT. Recent venous duplex examination suggested occlusion of the right common femoral artery, right superficial femoral artery and right popliteal artery. EXAM: CT ANGIOGRAPHY OF ABDOMINAL AORTA WITH ILIOFEMORAL RUNOFF TECHNIQUE: Multidetector CT imaging of the abdomen, pelvis and lower extremities was performed using the  standard protocol during bolus administration of intravenous contrast. Multiplanar CT image reconstructions and MIPs were obtained to evaluate the vascular anatomy. CONTRAST:  166mL  OMNIPAQUE IOHEXOL 350 MG/ML SOLN COMPARISON:  Venous duplex report from 12/22/2018. PET-CT 10/18/2018 FINDINGS: VASCULAR Aorta: Aorta at the hiatus measures 2.9 cm. Abdominal aorta near the bifurcation measures 3 cm with a small amount of mural thrombus. Negative for an aortic dissection. Celiac: Patent without evidence of aneurysm, dissection, vasculitis or significant stenosis. Incidentally, there is a replaced left hepatic artery from the left gastric artery. SMA: Patent without evidence of aneurysm, dissection, vasculitis or significant stenosis. Incidentally, there is a replaced right hepatic artery. Renals: Both renal arteries are patent without evidence of aneurysm, dissection, vasculitis, fibromuscular dysplasia or significant stenosis. IMA: Patent without evidence of aneurysm, dissection, vasculitis or significant stenosis. 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 Peripheral thrombus associated with the right common iliac artery aneurysm. Aneurysm of the right internal iliac artery trunk measuring up to 1.7 cm. Right internal iliac artery aneurysm is best seen on the coronal reformats, sequence 7, image 142. Right external iliac artery is widely patent measures up to 1.1 cm. Outflow: Peripheral mural thrombus in the right common femoral artery. Right common femoral artery is patent. The right profunda femoral artery is patent. Occlusion of the right SFA at the origin. The entire right SFA is occluded. No significant flow identified in the proximal and mid popliteal artery. Calcifications associated the popliteal artery compatible with underlying aneurysm. The distal popliteal artery at the level of the knee joint is small and patent. Runoff: Runoff vessels cannot be  accurately evaluated due to the timing of the study. LEFT Lower Extremity Inflow: Left common and external iliac artery are patent. The left internal iliac artery is occluded at the origin. There is reconstitution of the left internal iliac artery branches. Probable aneurysm involving the occluded left internal iliac artery measuring up to 1.9 cm. Outflow: Left common femoral artery is patent. The proximal left SFA and the proximal left profunda femoral artery are patent. Unfortunately, there is no significant contrast in left lower extremity beyond the mid thigh but this is most likely related to the timing of the study rather than an occlusion. Left popliteal artery cannot be evaluated. Runoff: Left runoff vessels cannot be evaluated due to timing of the study. Veins: No obvious venous abnormality within the limitations of this arterial phase study. Review of the MIP images confirms the above findings. NON-VASCULAR Lower chest: Limited evaluation of the lung bases. Hepatobiliary: Liver is only partially visualized. Gallbladder appears normal without distension. Pancreas: Unremarkable. No pancreatic ductal dilatation or surrounding inflammatory changes. Spleen: Spleen is partially visualized without gross abnormality. Adrenals/Urinary Tract: Normal adrenal glands. Exophytic hypodensity in the right kidney is likely related to a cyst and measures up to 3.1 cm. Limited evaluation of the kidneys due to motion artifact. Urinary bladder is unremarkable. Exophytic low-density structure in left kidney probably represents a cyst. Stomach/Bowel: Moderate amount of stool in the rectum. No evidence for bowel obstruction or focal bowel inflammation. Lymphatic: No abdominopelvic lymphadenopathy. Reproductive: Prostate is unremarkable. Other: Left inguinal hernia containing fat. Negative for ascites. Negative for free air. Umbilical hernia containing fat. Musculoskeletal: No acute abnormality. IMPRESSION: VASCULAR 1. Occluded  right SFA. The proximal and mid right popliteal artery are occluded. Probable aneurysm associated the right popliteal artery. 2. Limited evaluation of the bilateral runoff vessels due to the timing of the study and technical issues. 3. Limited evaluation of the left lower extremity beyond the proximal let thigh. Left outflow and runoff vessels cannot be accurately evaluated. 4.  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 Aneurysm of the right internal iliac artery measuring 1.7 cm. 5. Occlusion of the proximal left internal iliac artery. Probable aneurysm in the left internal iliac artery measuring up to 1.9 cm. 6. Abdominal aortic aneurysm measuring 3 cm. NON-VASCULAR 1. Exophytic renal cysts. 2. Umbilical hernia containing fat. Left inguinal hernia containing fat. Electronically Signed   By: Markus Daft M.D.   On: 12/23/2018 14:16   Vas [place] Lower Extremity Venous (dvt)  Result Date: 12/23/2018  Lower Venous Study Indications: Pain.  Risk Factors: DVT History of DVT and PE. Performing Technologist: Maudry Mayhew MHA, RDMS, RVT, RDCS  Examination Guidelines: A complete evaluation includes B-mode imaging, spectral Doppler, color Doppler, and power Doppler as needed of all accessible portions of each vessel. Bilateral testing is considered an integral part of a complete examination. Limited examinations for reoccurring indications may be performed as noted.  +---------+---------------+---------+-----------+----------+-------+ RIGHT    CompressibilityPhasicitySpontaneityPropertiesSummary +---------+---------------+---------+-----------+----------+-------+ CFV      Full           Yes      Yes                          +---------+---------------+---------+-----------+----------+-------+ SFJ      Full                                                 +---------+---------------+---------+-----------+----------+-------+ FV Prox  Full                                                  +---------+---------------+---------+-----------+----------+-------+ FV Mid   Full                                                 +---------+---------------+---------+-----------+----------+-------+ FV DistalFull                                                 +---------+---------------+---------+-----------+----------+-------+ PFV      Full                                                 +---------+---------------+---------+-----------+----------+-------+ POP      Full           Yes      Yes                          +---------+---------------+---------+-----------+----------+-------+ PTV      Full                                                 +---------+---------------+---------+-----------+----------+-------+ PERO  None                    No                   Acute   +---------+---------------+---------+-----------+----------+-------+   +---------+---------------+---------+-----------+----------+-------+ LEFT     CompressibilityPhasicitySpontaneityPropertiesSummary +---------+---------------+---------+-----------+----------+-------+ CFV      Full           Yes      Yes                          +---------+---------------+---------+-----------+----------+-------+ SFJ      Full                                                 +---------+---------------+---------+-----------+----------+-------+ FV Prox  Full                                                 +---------+---------------+---------+-----------+----------+-------+ FV Mid   Full                                                 +---------+---------------+---------+-----------+----------+-------+ FV DistalFull                                                 +---------+---------------+---------+-----------+----------+-------+ PFV      Full                                                 +---------+---------------+---------+-----------+----------+-------+ POP       Full           Yes      Yes                          +---------+---------------+---------+-----------+----------+-------+ PTV      Full                                                 +---------+---------------+---------+-----------+----------+-------+ PERO     Full                                                 +---------+---------------+---------+-----------+----------+-------+ Gastroc  None                                         Acute   +---------+---------------+---------+-----------+----------+-------+     Summary: Right: Findings consistent with acute deep vein thrombosis involving the right peroneal vein.  No cystic structure found in the popliteal fossa. 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 Right distal PTA is patent with monophasic flow. Left: Findings consistent with acute deep vein thrombosis involving the left gastrocnemius vein.  *See table(s) above for measurements and observations. Electronically signed by Servando Snare MD on 12/23/2018 at 12:25:42 PM.    Final

## 2018-12-29 ENCOUNTER — Other Ambulatory Visit: Payer: Self-pay

## 2018-12-29 ENCOUNTER — Inpatient Hospital Stay: Payer: Medicaid Other

## 2018-12-29 ENCOUNTER — Telehealth: Payer: Self-pay | Admitting: Hematology

## 2018-12-29 VITALS — BP 102/70 | HR 73 | Temp 98.8°F | Resp 16

## 2018-12-29 DIAGNOSIS — C090 Malignant neoplasm of tonsillar fossa: Secondary | ICD-10-CM

## 2018-12-29 DIAGNOSIS — Z5111 Encounter for antineoplastic chemotherapy: Secondary | ICD-10-CM | POA: Diagnosis not present

## 2018-12-29 MED ORDER — HEPARIN SOD (PORK) LOCK FLUSH 100 UNIT/ML IV SOLN
500.0000 [IU] | Freq: Once | INTRAVENOUS | Status: AC | PRN
  Administered 2018-12-29: 500 [IU]
  Filled 2018-12-29: qty 5

## 2018-12-29 MED ORDER — SODIUM CHLORIDE 0.9 % IV SOLN
Freq: Once | INTRAVENOUS | Status: AC
  Administered 2018-12-29: 12:00:00 via INTRAVENOUS
  Filled 2018-12-29: qty 5

## 2018-12-29 MED ORDER — PALONOSETRON HCL INJECTION 0.25 MG/5ML
0.2500 mg | Freq: Once | INTRAVENOUS | Status: AC
  Administered 2018-12-29: 12:00:00 0.25 mg via INTRAVENOUS

## 2018-12-29 MED ORDER — SODIUM CHLORIDE 0.9 % IV SOLN
Freq: Once | INTRAVENOUS | Status: AC
  Administered 2018-12-29: 09:00:00 via INTRAVENOUS
  Filled 2018-12-29: qty 250

## 2018-12-29 MED ORDER — SODIUM CHLORIDE 0.9 % IV SOLN
30.0000 mg/m2 | Freq: Once | INTRAVENOUS | Status: AC
  Administered 2018-12-29: 75 mg via INTRAVENOUS
  Filled 2018-12-29: qty 75

## 2018-12-29 MED ORDER — POTASSIUM CHLORIDE 2 MEQ/ML IV SOLN
Freq: Once | INTRAVENOUS | Status: AC
  Administered 2018-12-29: 09:00:00 via INTRAVENOUS
  Filled 2018-12-29: qty 10

## 2018-12-29 MED ORDER — PALONOSETRON HCL INJECTION 0.25 MG/5ML
INTRAVENOUS | Status: AC
  Filled 2018-12-29: qty 5

## 2018-12-29 MED ORDER — SODIUM CHLORIDE 0.9% FLUSH
10.0000 mL | INTRAVENOUS | Status: DC | PRN
  Administered 2018-12-29: 16:00:00 10 mL
  Filled 2018-12-29: qty 10

## 2018-12-29 NOTE — Patient Instructions (Signed)
[facility] Discharge Instructions for Patients Receiving Chemotherapy  Today you received the following chemotherapy agents: Cisplatin  To help prevent nausea and vomiting after your treatment, we encourage you to take your nausea medication  as prescribed.    If you develop nausea and vomiting that is not controlled by your nausea medication, call the clinic.   BELOW ARE SYMPTOMS THAT SHOULD BE REPORTED IMMEDIATELY:  *FEVER GREATER THAN 100.5 F  *CHILLS WITH OR WITHOUT FEVER  NAUSEA AND VOMITING THAT IS NOT CONTROLLED WITH YOUR NAUSEA MEDICATION  *UNUSUAL SHORTNESS OF BREATH  *UNUSUAL BRUISING OR BLEEDING  TENDERNESS IN MOUTH AND THROAT WITH OR WITHOUT PRESENCE OF ULCERS  *URINARY PROBLEMS  *BOWEL PROBLEMS  UNUSUAL RASH Items with * indicate a potential emergency and should be followed up as soon as possible.  Feel free to call the clinic should you have any questions or concerns. The clinic phone number is [phone].  Please show the CHEMO ALERT CARD at check-in to the Emergency Department and triage nurse.   

## 2018-12-29 NOTE — Telephone Encounter (Signed)
Scheduled appt per 6/10 los. Patient aware of appt date and time.

## 2018-12-30 ENCOUNTER — Telehealth: Payer: Self-pay | Admitting: Emergency Medicine

## 2018-12-30 ENCOUNTER — Other Ambulatory Visit: Payer: Self-pay | Admitting: *Deleted

## 2018-12-30 DIAGNOSIS — L309 Dermatitis, unspecified: Secondary | ICD-10-CM

## 2018-12-30 MED ORDER — TRIAMCINOLONE ACETONIDE 0.1 % EX LOTN: TOPICAL_LOTION | CUTANEOUS | 0 refills | Status: DC

## 2018-12-30 NOTE — Telephone Encounter (Signed)
Pt left VM stating that his Eliquis had come in the mail and wanted to know if he should start it/how much he should take.  Alerted MD Maylon Peppers who stated he would call the patient back to discuss administration.

## 2019-01-02 NOTE — Progress Notes (Signed)
[facility] OFFICE PROGRESS NOTE  Patient Care Team: Default, Provider, MD as PCP - General Beryle Beams, Alyson Locket, MD as Consulting Physician (Oncology)  HEME/ONC OVERVIEW: 1. 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 Melene Plan, invasive SCCa, p16+; not a candidate for TORS per ENT  -Late 10/2018 - present: definitive chemoradiation with weekly cisplatin   2. 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 -04/2017: no DVT in the RLE but an age-indeterminate superficial vein thrombosis involving the R greater saphenous vein  -12/2018: acute DVT involving R peroneal vein and L gastrocnemius vein; new arterial thrombosis involving R common femoral artery and popliteal arteries, due to artery aneurysm -Mid-12/2018 - present: on Eliquis 5mg  BID   3. Port placed in 10/2018; PEG deferred   TREATMENT REGIMEN:  Early 2000 - 12/2018: warfarin, non-compliant   11/08/2018 - 12/29/2018: definitive chemoradiation with weekly cisplatin x 6  Mid-12/2018 - present: Eliquis 5mg  BID   PERTINENT NON-HEM/ONC PROBLEMS: 1. Tobacco and EtOH abuse 2. Paroxysmal A-fib 3. 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, p16+  -S/p definitive chemoRT, completed on 12/29/2018 -No feeding tube due to patient preference -Review of the CT in late 11/2018 also showed  interval resolution of cervical lymphadenopathy without any new disease  -On exam, patient does not have any evidence of worsening nodal disease  -We will tentatively plan to obtain PET in 3-4 months after completing chemoRT to assess disease response -PRN anti-emetics: Zofran, Compazine, and Ativan   Chemotherapy-associated thrombocytopenia -Secondary to chemotherapy -Plts 85k today, lower than last visit  -Patient denies any symptoms of bleeding or excess bruising, such as epistaxis, hematochezia, melena, or hematuria -We will monitor for now  Hypomagnesemia -Secondary to electrolyte wasting from chemotherapy -Mg 1.6 today, patient is asymptomatic -As he has completed chemotherapy, I anticipate that his magnesium level will slowly improve and therefore no indication for IV Mg repletion -I have prescribed magnesium oxide 400 mg BID  Chemotherapy-associated mucositis -Secondary to chemoradiation -Currently on liquid IR morphine 10mg /0.60mL q4hrs PRN as well as occasional ibuprofen -Pain relatively tolerable and he has not required significant opioid medication  -Continue the regimen above as needed; I counseled the patient on limiting NSAID use due to concurrent Eliquis, which can increase the risk of bleeding   Recurrent DVT's and PTE -Hx of poor compliance with warfarin; he was approved for free Eliquis assistance program from [employer] but there was significant delay in the medication delivery  -Recurrent acute bilateral LE DVT and incidental RLL arterial thrombosis (likely due to aneurysm) demonstrated on doppler in 12/2018 -He was bridged with therapeutic Lovenox to Eliquis 5mg  in mid-12/2018  -He is currently tolerating Eliquis well without significant bleeding or bruising -Continue Eliquis starter pack as prescribed, the goal of anticoagulation is lifelong -Continue follow-up with vascular surgery as scheduled   Oral candidiasis -Secondary to chemoradiation -Not compliant with  fluconazole -He was prescribed fluconazole x 1 more week at the last visit  -On exam, he still has a small area of white plaques in the posterior oropharynx -I counseled the patient on the importance of medication compliance   Protein malnutrition -Secondary to chemoradiation -Weight down by 4 lbs since the last visit  -No feeding tube due to patient preference -I encouraged the patient to maintain adequate PO intake as tolerated and to adhere to the nutritional recommendations  Upper extremity rash -Possibly due to chemotherapy -Overall improving, confined to the upper extremities -Continue PRN fluticasone cream for now  Orders Placed This Encounter  Procedures  . CBC with Differential (Cancer Center Only)    Standing Status:   Future    Standing Expiration Date:   02/08/2020  . CMP ([facility] only)    Standing Status:   Future    Standing Expiration Date:   02/08/2020  . TSH    Standing Status:   Future    Standing Expiration Date:   01/04/2020  . T4, free    Standing Status:   Future    Standing Expiration Date:   01/04/2020  . Magnesium    Standing Status:   Future    Standing Expiration Date:   02/08/2020   All questions were answered. The patient knows to call the clinic with any problems, questions or concerns. No barriers to learning was detected.  Return in 2 weeks for labs, port flush and clinic appt.   Tish Men, MD 01/04/2019 3:50 PM  CHIEF COMPLAINT: "I am just tired"  INTERVAL HISTORY: Mr. Mcginley returns to clinic for follow-up of squamous cell carcinoma of the tonsil s/p definitive chemoRT.  Patient reports that since completing treatment, he has been feeling more tired.  He still has some pain in the throat, overall mild, for which she has not taking any pain medication.  He started his Eliquis starter pack over the weekend, and is so far doing well without any abnormal bleeding or bruising.  He has chronic numbness sensation in the bilateral feet, which is  unchanged.  He no longer has any pain in the right leg since starting anticoagulation.  He has been trying to maintain adequate oral intake, and is so far doing reasonably well.  He denies any complaint today.  SUMMARY OF ONCOLOGIC HISTORY: Oncology History  Malignant neoplasm of tonsillar fossa ([place])  10/10/2018 Imaging   CT neck w/ contrast: FINDINGS: Pharynx and larynx: There is asymmetric prominence of the left tonsillar region. No other mucosal or submucosal lesion is seen.  Salivary glands: Parotid and submandibular glands are normal.  Thyroid: Normal  Lymph nodes: Definite adenopathy on the left. 2 level nodes which shows some internal necrosis. The larger measures 2.2 cm in diameter and the smaller measures 1.4 cm in diameter. Suspicious level 4 node image 83 just anterior to the jugular vein measuring 9 mm short axis diameter. No worrisome nodes on the right.   10/10/2018 Imaging   CT chest:  IMPRESSION: 1. Mild right hilar adenopathy, equivocal for metastasis. No additional potential evidence of metastatic disease in the chest. PET-CT could be considered for further staging evaluation, as clinically warranted. 2. 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 Ascending thoracic aortic aneurysm. Recommend semi-annual imaging followup by CTA or MRA and referral to cardiothoracic surgery if not already obtained. This recommendation follows 2010 ACCF/AHA/AATS/ACR/ASA/SCA/SCAI/SIR/STS/SVM Guidelines for the Diagnosis and Management of Patients With Thoracic Aortic Disease. Circulation. 2010; 121: E527-P824. Aortic aneurysm NOS (ICD10-I71.9).  10/17/2018 Initial Diagnosis   Tonsil cancer ([place])   10/18/2018 Imaging   PET: IMPRESSION: 1. 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 2. Local hypermetabolic nodal metastasis to the LEFT level II nodal station. 3. No contralateral nodal metastasis. 4. No evidence distant metastatic  disease. 5. Two small pulmonary nodules in the LEFT lower lobe are favored benign. Recommend attention on follow-up. 6. Several small common iliac lymph nodes with low metabolic activity. Favor reactive adenopathy.   10/21/2018 Cancer Staging   Staging form: Pharynx - HPV-Mediated Oropharynx, AJCC 8th Edition - Clinical: Stage I (cT2, cN1, cM0, p16+) - Signed by Eppie Gibson, MD on 10/21/2018   11/10/2018 -  Chemotherapy   The patient had palonosetron (ALOXI) injection 0.25 mg, 0.25 mg, Intravenous,  Once, 6 of 7 cycles Administration: 0.25 mg (11/10/2018), 0.25 mg (11/17/2018), 0.25 mg (12/22/2018), 0.25 mg (11/24/2018), 0.25 mg (12/15/2018), 0.25 mg (12/29/2018) CISplatin (PLATINOL) 100 mg in sodium chloride 0.9 % 500 mL chemo infusion, 40 mg/m2 = 100 mg, Intravenous,  Once, 6 of 7 cycles Dose modification: 30 mg/m2 (original dose 40 mg/m2, Cycle 5, Reason: Dose not tolerated) Administration: 100 mg (11/10/2018), 100 mg (11/17/2018), 75 mg (12/22/2018), 100 mg (11/24/2018), 75 mg (12/15/2018), 75 mg (12/29/2018) fosaprepitant (EMEND) 150 mg, dexamethasone (DECADRON) 12 mg in sodium chloride 0.9 % 145 mL IVPB, , Intravenous,  Once, 6 of 7 cycles Administration:  (11/10/2018),  (11/17/2018),  (12/22/2018),  (11/24/2018),  (12/15/2018),  (12/29/2018)  for chemotherapy treatment.      REVIEW OF SYSTEMS:   Constitutional: ( - ) fevers, ( - )  chills , ( - ) night sweats Eyes: ( - ) blurriness of vision, ( - ) double vision, ( - ) watery eyes Ears, nose, mouth, throat, and face: ( + ) mucositis, ( + ) sore throat Respiratory: ( - ) cough, ( - ) dyspnea, ( - ) wheezes Cardiovascular: ( - ) palpitation, ( - ) chest discomfort, ( - ) lower extremity swelling Gastrointestinal:  ( - ) nausea, ( - ) heartburn, ( - ) change in bowel habits Skin: ( - ) abnormal skin rashes Lymphatics: ( - ) new lymphadenopathy, ( - ) easy bruising Neurological: ( + ) numbness, ( - ) tingling, ( - ) new weaknesses Behavioral/Psych: ( - )  mood change, ( - ) new changes  All other systems were reviewed with the patient and are negative.  I have reviewed the past medical history, past surgical history, social history and family history with the patient and they are unchanged from previous note.  ALLERGIES:  has No Known Allergies.  MEDICATIONS:  Current Outpatient Medications  Medication Sig Dispense Refill  . amLODipine (NORVASC) 10 MG tablet TAKE 1 TABLET BY MOUTH EVERY DAY (Patient taking differently: Take 10 mg by mouth daily. ) 90 tablet 2  . Eliquis DVT/PE Starter Pack (ELIQUIS STARTER PACK) 5 MG TABS Take as directed on package: start with two-5mg  tablets twice daily for 7 days. On day 8, switch to one-5mg  tablet twice daily. 1 each 0  . fluconazole (DIFLUCAN) 100 MG tablet Take 1 tablet (100 mg total) by mouth daily for 7 days. 7 tablet 0  . Fluticasone Propionate 0.05 % LOTN Apply 1 application topically 2 (two) times daily. Apply to rash twice daily as needed 120 mL 1  . ibuprofen (ADVIL) 200 MG tablet Take 600 mg by mouth every 6 (six) hours as needed for pain or fever.     . sodium  fluoride (PREVIDENT 5000 PLUS) 1.1 % CREA dental cream Apply to tooth brush. Brush teeth for 2 minutes. Spit out excess. DO NOT rinse afterwards. Repeat nightly. 1 Tube prn  . triamcinolone lotion (KENALOG) 0.1 % Apply to affected area twice a day 60 mL 0  . dexamethasone (DECADRON) 4 MG tablet Take 2 tablets by mouth once a day on the day after chemotherapy and then take 2 tablets two times a day for 2 days. Take with food. (Patient not taking: Reported on 12/28/2018) 30 tablet 1  . magnesium oxide (MAG-OX) 400 (241.3 Mg) MG tablet Take 1 tablet (400 mg total) by mouth 2 (two) times daily for 30 days. 60 tablet 2   No current facility-administered medications for this visit.    Facility-Administered Medications Ordered in Other Visits  Medication Dose Route Frequency Provider Last Rate Last Dose  . heparin lock flush 100 unit/mL  500 Units  Intravenous Once Tish Men, MD      . sodium chloride flush (NS) 0.9 % injection 10 mL  10 mL Intravenous PRN Tish Men, MD        PHYSICAL EXAMINATION: ECOG PERFORMANCE STATUS: 1 - Symptomatic but completely ambulatory  Today's Vitals   01/04/19 1506 01/04/19 1520  BP: 103/78   Pulse: 64   Resp: 17   Temp: 98.5 F (36.9 C)   TempSrc: Oral   SpO2: 96%   Weight: 234 lb 6.4 oz (106.3 kg)   Height: 6\' 3"  (1.905 m)   PainSc:  2    Body mass index is 29.3 kg/m.  Filed Weights   01/04/19 1506  Weight: 234 lb 6.4 oz (106.3 kg)    GENERAL: alert, no distress and comfortable SKIN: mild erythema over the bilateral neck secondary to radiation  EYES: conjunctiva are pink and non-injected, sclera clear OROPHARYNX: a small area of white plaques over the posterior oropharynx, Grade 1 mucositis  NECK: supple, non-tender LYMPH:  no palpable lymphadenopathy in the cervical LUNGS: clear to auscultation with normal breathing effort HEART: regular rate & rhythm and no murmurs and no lower extremity edema ABDOMEN: soft, non-tender, non-distended, normal bowel sounds Musculoskeletal: no cyanosis of digits and no clubbing  PSYCH: alert & oriented x 3, fluent speech NEURO: no focal motor/sensory deficits  LABORATORY DATA:  I have reviewed the data as listed    Component Value Date/Time   NA 138 01/04/2019 1438   NA 142 10/03/2018 1450   NA 141 05/21/2014 1226   K 4.3 01/04/2019 1438   K 4.3 05/21/2014 1226   CL 100 01/04/2019 1438   CL 108 (H) 12/28/2012 1405   CO2 26 01/04/2019 1438   CO2 28 05/21/2014 1226   GLUCOSE 88 01/04/2019 1438   GLUCOSE 101 05/21/2014 1226   GLUCOSE 95 12/28/2012 1405   BUN 22 (H) 01/04/2019 1438   BUN 15 10/03/2018 1450   BUN 13.2 05/21/2014 1226   CREATININE 1.07 01/04/2019 1438   CREATININE 1.00 09/24/2014 1413   CREATININE 0.9 05/21/2014 1226   CALCIUM 9.2 01/04/2019 1438   CALCIUM 9.3 05/21/2014 1226   PROT 7.4 10/21/2018 1148   PROT 7.2  10/03/2018 1450   PROT 7.1 01/08/2014 1203   ALBUMIN 4.4 10/21/2018 1148   ALBUMIN 4.3 10/03/2018 1450   ALBUMIN 3.7 01/08/2014 1203   AST 20 10/21/2018 1148   AST 50 (H) 01/08/2014 1203   ALT 19 10/21/2018 1148   ALT 83 (H) 01/08/2014 1203   ALKPHOS 73 10/21/2018 1148   ALKPHOS 87  01/08/2014 1203   BILITOT 0.6 10/21/2018 1148   BILITOT 0.67 01/08/2014 1203   GFRNONAA >60 01/04/2019 1438   GFRNONAA >60 10/15/2011 0224   GFRAA >60 01/04/2019 1438   GFRAA >60 10/15/2011 0224    No results found for: SPEP, UPEP  Lab Results  Component Value Date   WBC 6.2 01/04/2019   NEUTROABS 5.2 01/04/2019   HGB 14.9 01/04/2019   HCT 43.2 01/04/2019   MCV 96.4 01/04/2019   PLT 85 (L) 01/04/2019      Chemistry      Component Value Date/Time   NA 138 01/04/2019 1438   NA 142 10/03/2018 1450   NA 141 05/21/2014 1226   K 4.3 01/04/2019 1438   K 4.3 05/21/2014 1226   CL 100 01/04/2019 1438   CL 108 (H) 12/28/2012 1405   CO2 26 01/04/2019 1438   CO2 28 05/21/2014 1226   BUN 22 (H) 01/04/2019 1438   BUN 15 10/03/2018 1450   BUN 13.2 05/21/2014 1226   CREATININE 1.07 01/04/2019 1438   CREATININE 1.00 09/24/2014 1413   CREATININE 0.9 05/21/2014 1226      Component Value Date/Time   CALCIUM 9.2 01/04/2019 1438   CALCIUM 9.3 05/21/2014 1226   ALKPHOS 73 10/21/2018 1148   ALKPHOS 87 01/08/2014 1203   AST 20 10/21/2018 1148   AST 50 (H) 01/08/2014 1203   ALT 19 10/21/2018 1148   ALT 83 (H) 01/08/2014 1203   BILITOT 0.6 10/21/2018 1148   BILITOT 0.67 01/08/2014 1203       RADIOGRAPHIC STUDIES: I have personally reviewed the radiological images as listed below and agreed with the findings in the report. Ct Soft Tissue Neck W Contrast  Result Date: 12/14/2018 CLINICAL DATA:  Left neck swelling. Currently on chemo radiation. Previously demonstrated left tonsillar mass. EXAM: CT NECK WITH CONTRAST TECHNIQUE: Multidetector CT imaging of the neck was performed using the standard  protocol following the bolus administration of intravenous contrast. CONTRAST:  25mL OMNIPAQUE IOHEXOL 300 MG/ML  SOLN COMPARISON:  CT neck 10/10/2018 and PET CT 10/18/2018 FINDINGS: PHARYNX AND LARYNX: --Nasopharynx: Fossae of Rosenmuller are clear. Normal adenoid tonsils for age. --Oral cavity and oropharynx: Previously demonstrated fullness of the left lingual tonsil has decreased. No discrete mass. No new lesion. --Hypopharynx: Normal vallecula and pyriform sinuses. --Larynx: Normal epiglottis and pre-epiglottic space. Normal aryepiglottic and vocal folds. --Retropharyngeal space: No abscess, effusion or lymphadenopathy. SALIVARY GLANDS: --Parotid: No mass lesion or inflammation. No sialolithiasis or ductal dilatation. --Submandibular: Symmetric without inflammation. No sialolithiasis or ductal dilatation. --Sublingual: Normal. No ranula or other visible lesion of the base of tongue and floor of mouth. THYROID: Normal. LYMPH NODES: No enlarged or abnormal density lymph nodes. Previously demonstrated abnormal lymph nodes along the left cervical chain have resolved. No new enlarged or abnormal density nodes. VASCULAR: Major cervical vessels are patent. LIMITED INTRACRANIAL: Normal. VISUALIZED ORBITS: Normal. MASTOIDS AND VISUALIZED PARANASAL SINUSES: No fluid levels or advanced mucosal thickening. No mastoid effusion. SKELETON: No bony spinal canal stenosis. No lytic or blastic lesions. UPPER CHEST: Clear. OTHER: None. IMPRESSION: 1. No acute abnormality of the cervical soft tissues. No new soft tissue mass. 2. Markedly decreased fullness at the left lingual tonsillar site identified on PET CT. 3. Resolution of left cervical lymphadenopathy. Electronically Signed   By: Ulyses Jarred M.D.   On: 12/14/2018 19:05   Ct Angio Ao+bifem W & Or Wo Contrast  Result Date: 12/23/2018 CLINICAL DATA:  59 year old with lower extremity DVT. Recent venous  duplex examination suggested occlusion of the right common femoral  artery, right superficial femoral artery and right popliteal artery. EXAM: CT ANGIOGRAPHY OF ABDOMINAL AORTA WITH ILIOFEMORAL RUNOFF TECHNIQUE: Multidetector CT imaging of the abdomen, pelvis and lower extremities was performed using the standard protocol during bolus administration of intravenous contrast. Multiplanar CT image reconstructions and MIPs were obtained to evaluate the vascular anatomy. CONTRAST:  157mL OMNIPAQUE IOHEXOL 350 MG/ML SOLN COMPARISON:  Venous duplex report from 12/22/2018. PET-CT 10/18/2018 FINDINGS: VASCULAR Aorta: Aorta at the hiatus measures 2.9 cm. Abdominal aorta near the bifurcation measures 3 cm with a small amount of mural thrombus. Negative for an aortic dissection. Celiac: Patent without evidence of aneurysm, dissection, vasculitis or significant stenosis. Incidentally, there is a replaced left hepatic artery from the left gastric artery. SMA: Patent without evidence of aneurysm, dissection, vasculitis or significant stenosis. Incidentally, there is a replaced right hepatic artery. Renals: Both renal arteries are patent without evidence of aneurysm, dissection, vasculitis, fibromuscular dysplasia or significant stenosis. IMA: Patent without evidence of aneurysm, dissection, vasculitis or significant stenosis. 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 Peripheral thrombus associated with the right common iliac artery aneurysm. Aneurysm of the right internal iliac artery trunk measuring up to 1.7 cm. Right internal iliac artery aneurysm is best seen on the coronal reformats, sequence 7, image 142. Right external iliac artery is widely patent measures up to 1.1 cm. Outflow: Peripheral mural thrombus in the right common femoral artery. Right common femoral artery is patent. The right profunda femoral artery is patent. Occlusion of the right SFA at the origin. The entire right SFA is occluded. No significant flow identified in the  proximal and mid popliteal artery. Calcifications associated the popliteal artery compatible with underlying aneurysm. The distal popliteal artery at the level of the knee joint is small and patent. Runoff: Runoff vessels cannot be accurately evaluated due to the timing of the study. LEFT Lower Extremity Inflow: Left common and external iliac artery are patent. The left internal iliac artery is occluded at the origin. There is reconstitution of the left internal iliac artery branches. Probable aneurysm involving the occluded left internal iliac artery measuring up to 1.9 cm. Outflow: Left common femoral artery is patent. The proximal left SFA and the proximal left profunda femoral artery are patent. Unfortunately, there is no significant contrast in left lower extremity beyond the mid thigh but this is most likely related to the timing of the study rather than an occlusion. Left popliteal artery cannot be evaluated. Runoff: Left runoff vessels cannot be evaluated due to timing of the study. Veins: No obvious venous abnormality within the limitations of this arterial phase study. Review of the MIP images confirms the above findings. NON-VASCULAR Lower chest: Limited evaluation of the lung bases. Hepatobiliary: Liver is only partially visualized. Gallbladder appears normal without distension. Pancreas: Unremarkable. No pancreatic ductal dilatation or surrounding inflammatory changes. Spleen: Spleen is partially visualized without gross abnormality. Adrenals/Urinary Tract: Normal adrenal glands. Exophytic hypodensity in the right kidney is likely related to a cyst and measures up to 3.1 cm. Limited evaluation of the kidneys due to motion artifact. Urinary bladder is unremarkable. Exophytic low-density structure in left kidney probably represents a cyst. Stomach/Bowel: Moderate amount of stool in the rectum. No evidence for bowel obstruction or focal bowel inflammation. Lymphatic: No abdominopelvic lymphadenopathy.  Reproductive: Prostate is unremarkable. Other: Left inguinal hernia containing fat. Negative for ascites. Negative for free air. Umbilical hernia containing fat.  Musculoskeletal: No acute abnormality. IMPRESSION: VASCULAR 1. Occluded right SFA. The proximal and mid right popliteal artery are occluded. Probable aneurysm associated the right popliteal artery. 2. Limited evaluation of the bilateral runoff vessels due to the timing of the study and technical issues. 3. Limited evaluation of the left lower extremity beyond the proximal let thigh. Left outflow and runoff vessels cannot be accurately evaluated. 4. 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 Aneurysm of the right internal iliac artery measuring 1.7 cm. 5. Occlusion of the proximal left internal iliac artery. Probable aneurysm in the left internal iliac artery measuring up to 1.9 cm. 6. Abdominal aortic aneurysm measuring 3 cm. NON-VASCULAR 1. Exophytic renal cysts. 2. Umbilical hernia containing fat. Left inguinal hernia containing fat. Electronically Signed   By: Markus Daft M.D.   On: 12/23/2018 14:16   Vas [place] Lower Extremity Venous (dvt)  Result Date: 12/23/2018  Lower Venous Study Indications: Pain.  Risk Factors: DVT History of DVT and PE. Performing Technologist: Maudry Mayhew MHA, RDMS, RVT, RDCS  Examination Guidelines: A complete evaluation includes B-mode imaging, spectral Doppler, color Doppler, and power Doppler as needed of all accessible portions of each vessel. Bilateral testing is considered an integral part of a complete examination. Limited examinations for reoccurring indications may be performed as noted.  +---------+---------------+---------+-----------+----------+-------+ RIGHT    CompressibilityPhasicitySpontaneityPropertiesSummary +---------+---------------+---------+-----------+----------+-------+ CFV      Full           Yes      Yes                           +---------+---------------+---------+-----------+----------+-------+ SFJ      Full                                                 +---------+---------------+---------+-----------+----------+-------+ FV Prox  Full                                                 +---------+---------------+---------+-----------+----------+-------+ FV Mid   Full                                                 +---------+---------------+---------+-----------+----------+-------+ FV DistalFull                                                 +---------+---------------+---------+-----------+----------+-------+ PFV      Full                                                 +---------+---------------+---------+-----------+----------+-------+ POP      Full           Yes      Yes                          +---------+---------------+---------+-----------+----------+-------+  PTV      Full                                                 +---------+---------------+---------+-----------+----------+-------+ PERO     None                    No                   Acute   +---------+---------------+---------+-----------+----------+-------+   +---------+---------------+---------+-----------+----------+-------+ LEFT     CompressibilityPhasicitySpontaneityPropertiesSummary +---------+---------------+---------+-----------+----------+-------+ CFV      Full           Yes      Yes                          +---------+---------------+---------+-----------+----------+-------+ SFJ      Full                                                 +---------+---------------+---------+-----------+----------+-------+ FV Prox  Full                                                 +---------+---------------+---------+-----------+----------+-------+ FV Mid   Full                                                 +---------+---------------+---------+-----------+----------+-------+ FV DistalFull                                                  +---------+---------------+---------+-----------+----------+-------+ PFV      Full                                                 +---------+---------------+---------+-----------+----------+-------+ POP      Full           Yes      Yes                          +---------+---------------+---------+-----------+----------+-------+ PTV      Full                                                 +---------+---------------+---------+-----------+----------+-------+ PERO     Full                                                 +---------+---------------+---------+-----------+----------+-------+ Gastroc  None  Acute   +---------+---------------+---------+-----------+----------+-------+     Summary: Right: Findings consistent with acute deep vein thrombosis involving the right peroneal vein. No cystic structure found in the popliteal fossa. 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 Right distal PTA is patent with monophasic flow. Left: Findings consistent with acute deep vein thrombosis involving the left gastrocnemius vein.  *See table(s) above for measurements and observations. Electronically signed by Servando Snare MD on 12/23/2018 at 12:25:42 PM.    Final

## 2019-01-03 ENCOUNTER — Telehealth: Payer: Self-pay | Admitting: *Deleted

## 2019-01-03 NOTE — Telephone Encounter (Signed)
Pt called yesterday and left message re:  Pt had started Eliquis starter pack on Saturday 12/31/18.  Wanted to know if he should continue taking Fluconazole. Attempted to call pt back today to clarify when last dose of Fluconazole will be, and if pt continues to take med since Saturday.  Left message on voice mail requesting a call back to nurse. Pt's   Phone        [phone]

## 2019-01-03 NOTE — Telephone Encounter (Signed)
Spoke with pt and informed pt of Dr. [name] instructions - okay to continue with Fluconazole while on Eliquis.  Pt stated he had not taken Fluconazole for past couple of days.  Pt voiced understanding, and aware of office appt for 01/04/2019.

## 2019-01-03 NOTE — Telephone Encounter (Signed)
Okay to continue to take fluconazole while on Eliquis. There is no significant interaction.  Thank you.  Dr. Maylon Peppers

## 2019-01-04 ENCOUNTER — Inpatient Hospital Stay: Payer: Medicaid Other

## 2019-01-04 ENCOUNTER — Telehealth: Payer: Self-pay | Admitting: *Deleted

## 2019-01-04 ENCOUNTER — Inpatient Hospital Stay (HOSPITAL_BASED_OUTPATIENT_CLINIC_OR_DEPARTMENT_OTHER): Payer: Medicaid Other | Admitting: Hematology

## 2019-01-04 ENCOUNTER — Other Ambulatory Visit: Payer: Self-pay

## 2019-01-04 ENCOUNTER — Encounter: Payer: Self-pay | Admitting: Hematology

## 2019-01-04 VITALS — BP 103/78 | HR 64 | Temp 98.5°F | Resp 17 | Ht 75.0 in | Wt 234.4 lb

## 2019-01-04 DIAGNOSIS — K1231 Oral mucositis (ulcerative) due to antineoplastic therapy: Secondary | ICD-10-CM

## 2019-01-04 DIAGNOSIS — C090 Malignant neoplasm of tonsillar fossa: Secondary | ICD-10-CM

## 2019-01-04 DIAGNOSIS — I2609 Other pulmonary embolism with acute cor pulmonale: Secondary | ICD-10-CM

## 2019-01-04 DIAGNOSIS — D6959 Other secondary thrombocytopenia: Secondary | ICD-10-CM | POA: Diagnosis not present

## 2019-01-04 DIAGNOSIS — B370 Candidal stomatitis: Secondary | ICD-10-CM | POA: Diagnosis not present

## 2019-01-04 DIAGNOSIS — T451X5A Adverse effect of antineoplastic and immunosuppressive drugs, initial encounter: Secondary | ICD-10-CM

## 2019-01-04 DIAGNOSIS — R200 Anesthesia of skin: Secondary | ICD-10-CM

## 2019-01-04 DIAGNOSIS — E46 Unspecified protein-calorie malnutrition: Secondary | ICD-10-CM

## 2019-01-04 DIAGNOSIS — Z7901 Long term (current) use of anticoagulants: Secondary | ICD-10-CM

## 2019-01-04 DIAGNOSIS — I825Y1 Chronic embolism and thrombosis of unspecified deep veins of right proximal lower extremity: Secondary | ICD-10-CM | POA: Diagnosis not present

## 2019-01-04 DIAGNOSIS — Z86711 Personal history of pulmonary embolism: Secondary | ICD-10-CM

## 2019-01-04 DIAGNOSIS — R21 Rash and other nonspecific skin eruption: Secondary | ICD-10-CM

## 2019-01-04 DIAGNOSIS — Z5111 Encounter for antineoplastic chemotherapy: Secondary | ICD-10-CM | POA: Diagnosis not present

## 2019-01-04 LAB — BASIC METABOLIC PANEL - CANCER CENTER ONLY
Anion gap: 12 (ref 5–15)
BUN: 22 mg/dL — ABNORMAL HIGH (ref 6–20)
CO2: 26 mmol/L (ref 22–32)
Calcium: 9.2 mg/dL (ref 8.9–10.3)
Chloride: 100 mmol/L (ref 98–111)
Creatinine: 1.07 mg/dL (ref 0.61–1.24)
GFR, Est AFR Am: >60 mL/min (ref >60)
GFR, Estimated: >60 mL/min (ref >60)
Glucose, Bld: 88 mg/dL (ref 70–99)
Potassium: 4.3 mmol/L (ref 3.5–5.1)
Sodium: 138 mmol/L (ref 135–145)

## 2019-01-04 LAB — CBC WITH DIFFERENTIAL (CANCER CENTER ONLY)
Abs Immature Granulocytes: 0.03 10*3/uL (ref 0.00–0.07)
Basophils Absolute: 0 10*3/uL (ref 0.0–0.1)
Basophils Relative: 0 %
Eosinophils Absolute: 0 10*3/uL (ref 0.0–0.5)
Eosinophils Relative: 1 %
HCT: 43.2 % (ref 39.0–52.0)
Hemoglobin: 14.9 g/dL (ref 13.0–17.0)
Immature Granulocytes: 1 %
Lymphocytes Relative: 4 %
Lymphs Abs: 0.2 10*3/uL — ABNORMAL LOW (ref 0.7–4.0)
MCH: 33.3 pg (ref 26.0–34.0)
MCHC: 34.5 g/dL (ref 30.0–36.0)
MCV: 96.4 fL (ref 80.0–100.0)
Monocytes Absolute: 0.6 10*3/uL (ref 0.1–1.0)
Monocytes Relative: 10 %
Neutro Abs: 5.2 10*3/uL (ref 1.7–7.7)
Neutrophils Relative %: 84 %
Platelet Count: 85 10*3/uL — ABNORMAL LOW (ref 150–400)
RBC: 4.48 MIL/uL (ref 4.22–5.81)
RDW: 16.7 % — ABNORMAL HIGH (ref 11.5–15.5)
WBC Count: 6.2 10*3/uL (ref 4.0–10.5)
nRBC: 0 % (ref 0.0–0.2)

## 2019-01-04 LAB — PROTIME-INR
INR: 1.1 (ref 0.8–1.2)
Prothrombin Time: 13.8 seconds (ref 11.4–15.2)

## 2019-01-04 LAB — MAGNESIUM: Magnesium: 1.6 mg/dL — ABNORMAL LOW (ref 1.7–2.4)

## 2019-01-04 MED ORDER — MAGNESIUM OXIDE 400 (241.3 MG) MG PO TABS: 400.0000 mg | ORAL_TABLET | Freq: Two times a day (BID) | ORAL | 2 refills | Status: AC

## 2019-01-04 NOTE — Telephone Encounter (Signed)
-----   Message from Tish Men, MD sent at 01/04/2019  3:44 PM EDT ----- Regarding: Low Mg Hi [name],  Can you let Mr. [name] [name] know that his Mg level is low, and I have sent a prescription for oral Mg supplement to his pharmacy?Thanks.  [place]

## 2019-01-04 NOTE — Telephone Encounter (Signed)
Spoke with pt and informed him of low Magnesium level.  Per pt, he is taking Magnesium BID - has not taken evening dose yet.  Reinforced the need to continue with oral Magnesium as per Dr. Maylon Peppers.  Pt voiced understanding.

## 2019-01-05 ENCOUNTER — Telehealth: Payer: Self-pay | Admitting: Hematology

## 2019-01-05 NOTE — Telephone Encounter (Signed)
Called and swp regarding appointments date/time per 6/17 staff message

## 2019-01-05 NOTE — Telephone Encounter (Signed)
Per 6/17 los Return to be scheduled at [facility].

## 2019-01-10 NOTE — Progress Notes (Signed)
  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 He experienced mild fatigue and some dysphagia with moderate throat pain. He also noted thick saliva and taste changes. He has had some weight loss but reports he is eating and drinking well. Bright erythema noted over neck. He was advised to apply Sonafine 2-3 times a day for skin irritation.  Plan: The patient will follow-up with radiation oncology in 2-3 weeks.  ________________________________________________  Eppie Gibson, MD  This document serves as a record of services personally performed by Eppie Gibson, MD. It was created on her behalf by Rae Lips, a trained medical scribe. The creation of this record is based on the scribe's personal observations and the provider's statements to them. This document has been checked and approved by the attending provider.

## 2019-01-13 ENCOUNTER — Telehealth: Payer: Self-pay | Admitting: Hematology

## 2019-01-13 ENCOUNTER — Other Ambulatory Visit: Payer: Self-pay

## 2019-01-13 ENCOUNTER — Ambulatory Visit (HOSPITAL_COMMUNITY)
Admission: RE | Admit: 2019-01-13 | Discharge: 2019-01-13 | Disposition: A | Discharge status: Home / Self Care | Payer: Medicaid Other | Source: Ambulatory Visit | Attending: Hematology | Admitting: Hematology

## 2019-01-13 ENCOUNTER — Encounter (HOSPITAL_COMMUNITY): Payer: Medicaid Other

## 2019-01-13 ENCOUNTER — Telehealth: Payer: Self-pay | Admitting: *Deleted

## 2019-01-13 ENCOUNTER — Other Ambulatory Visit: Payer: Self-pay | Admitting: Hematology

## 2019-01-13 DIAGNOSIS — I825Y1 Chronic embolism and thrombosis of unspecified deep veins of right proximal lower extremity: Secondary | ICD-10-CM | POA: Diagnosis not present

## 2019-01-13 MED ORDER — ENOXAPARIN SODIUM 120 MG/0.8ML ~~LOC~~ SOLN: 120.0000 mg | Freq: Two times a day (BID) | SUBCUTANEOUS | 0 refills | Status: DC

## 2019-01-13 MED FILL — ENOXAPARIN 120 MG/0.8 ML SY: 120 | 21 days supply | Qty: 34 | Fill #0

## 2019-01-13 NOTE — Progress Notes (Signed)
Left lower extremity venous duplex completed. Refer to "CV Proc" under chart review to view preliminary results.  Critical results discussed with Dr. Maylon Peppers.  01/13/2019 3:47 PM Maudry Mayhew, MHA, RVT, RDCS, RDMS

## 2019-01-13 NOTE — Telephone Encounter (Signed)
Pt states he thinks he has a blood clot in his left leg. Has been hurting since Tuesday/Wednesday. " I am very familiar with blood clots, I'm taking eliquis and have been on lovenox in the past"  Dr Maylon Peppers notified- stat doppler ordered.  Pt notified to come to [facility] at 1100. Does not have transportation until 2. Pt is going to call us office to see what they can work out.   Scheduled for 3:00pm

## 2019-01-13 NOTE — Telephone Encounter (Signed)
Scheduled apt per 6/26 sch message - pt aware of appt date and time

## 2019-01-13 NOTE — Telephone Encounter (Signed)
Patient called the clinic today regarding increasing left lower extremity pain.  He reports that he started having increasing left calf pain approximately 2 days ago.  Patient was recently changed from warfarin to Eliquis due to poor compliance and frequent subtherapeutic INRs.  He reports compliance with Eliquis twice a day.  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  Patient reports that since he developed increasing left lower extremity pain, he stopped taking his Eliquis yesterday and started Lovenox 1 mg/kg yesterday evening because he thought he may have a new blood clot.  However, he did not discussed this with the oncology clinic.  I discussed the preliminary imaging results with the patient.  Given the progression of DVT in the left lower extremity despite reported compliance with Eliquis, I recommended him to go back on therapeutic Lovenox injection 1 mg/kg BID (120mg  BID). I have sent a prescription for 21 days of Lovenox to his pharmacy.  I also discussed with the patient that given the presumed failure of Eliquis, his anticoagulation options are Lovenox indefinitely vs. 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  Patient expressed preference for Lovenox bridging back warfarin.  As the patient is established with the anticoagulation clinic with [facility], I recommended the patient to contact anticoagulation on Monday to set up an appointment to facilitate transition back on warfarin.  Meanwhile, if he develops worsening lower extremity pain, chest pain, dyspnea, or hemoptysis over the weekend, he is instructed to go to the ER for further evaluation.  Patient expressed understanding and agreed with the plan.

## 2019-01-16 ENCOUNTER — Telehealth: Payer: Self-pay | Admitting: Pharmacist

## 2019-01-16 NOTE — Telephone Encounter (Signed)
Thank you :)

## 2019-01-16 NOTE — Telephone Encounter (Signed)
No problem. As [name] [name] has had issues with timely follow-up in the past, I would appreciate your help if you can reach out to him, so that we can get him in as soon as possible.  Thank you. Let me know if I can help with anything else.  Dr. Maylon Peppers

## 2019-01-16 NOTE — Telephone Encounter (Signed)
Called patient. Was provided instructions regarding CONTINUED LMWH/enoxaparin/Lovenox 120mg  SQ BID for 21 days (ending on 03-Feb-2019) WARFARIN 10mg  PO QD will commence on Monday 30-Jan-2019 for 5 day overlap. Appt. 06-Feb-2019 at [facility]. Patient has LMWH at home AND warfarin 10mg  strength-white, warfarin tablets. Will create a "word" document detailing these instructions, a copy of which will be MAILED to the patient and a copy scanned in to [facility]. We discussed at length the important question:  Compliance to his Eliquis. He states he was fully compliant (actually told me his dosing regimen). We discussed that while Eliquis does not require monitoring--it appears he has "failed" Eliquis for which his oncologist had advised he do one of two things: 120mg  Lovenox BID for 21 days (full treatment) and then revert to a LOWER DOSE of Lovenox DAILY (as per NCCN/ASCO Recommendations)--OR, transition OFF of LMWH ONTO warfarin. Patient had a long history of non-compliance with attendance at the anticoagulation management clinic which was documented. Discussed that he MUST be compliant to RTC visits to anticoagulation management clinic--as well as his current hypercoagulable state (more than usual, with active cancer). Will CONTINUE LMWH 120mg  SQ BID through Friday 03-Feb-2019. On Monday July 13th, 2020--will COMMENCE warfarin 10mg  PO QD for 5 day overlap (Lovenox ending on 03-Feb-2019). He will CONTINUE warfarin 10mg  PO qd until he sees me in anticoagulation management clinic on Monday 06-Feb-2019 at  [facility].

## 2019-01-16 NOTE — Telephone Encounter (Signed)
Seems reasonable, thank you

## 2019-01-16 NOTE — Telephone Encounter (Signed)
Thank you for this follow-up/documentation. Will await his call to re-establish care within the anticoagulation management clinic. If I do not hear from him, I will call him. Did you provide him with any warfarin?

## 2019-01-16 NOTE — Telephone Encounter (Signed)
Will do sir.

## 2019-01-16 NOTE — Telephone Encounter (Signed)
Patient being provided written instructions for bridging.

## 2019-01-17 ENCOUNTER — Ambulatory Visit: Payer: Medicaid Other | Admitting: Hematology

## 2019-01-17 ENCOUNTER — Other Ambulatory Visit: Payer: Medicaid Other

## 2019-01-17 NOTE — Progress Notes (Signed)
[place] OFFICE PROGRESS NOTE  Patient Care Team: Default, Provider, MD as PCP - General Beryle Beams, Alyson Locket, MD as Consulting Physician (Oncology)  HEME/ONC OVERVIEW: 1. 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 Melene Plan, invasive SCCa, p16+; not a candidate for TORS per ENT  -Late 10/2018 - present: definitive chemoradiation with weekly cisplatin   2. 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 -12/2018: acute DVT involving R peroneal vein and L gastrocnemius vein; new arterial thrombosis involving R common femoral artery and popliteal arteries, due to artery aneurysm -Late 12/2018: progression of acute DVT in the LLE from gastrocnemius vein to the femoral, popliteal, posterior tibial and peroneal veins despite being on Eliquis   3. Port placed in 10/2018; PEG deferred   TREATMENT REGIMEN:  11/08/2018 - 12/29/2018: definitive chemoradiation with weekly cisplatin x 6  Anticoagulation:  Early 2000 - 12/2018: warfarin, non-compliant   12/2018: Eliquis 5mg  BID x ~4 weeks with clot progression  Late 12/2018 - present: Lovenox 1mg /kg BID bridging to warfarin   PERTINENT NON-HEM/ONC PROBLEMS: 1. Tobacco and EtOH abuse 2. Paroxysmal A-fib 3. 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, p16+  -S/p definitive chemoRT, completed on  12/29/2018 -No feeding tube due to patient preference  -We will plan to obtain PET in 3-4 months after completing chemoRT to assess disease response -PRN anti-emetics: Zofran, Compazine, and Ativan   Acute LLE DVT -Patient was transitioned from warfarin to Eliquis in early 12/2018 for ease of administration, but recent doppler of the LLE showed progression of the acute DVT extending to the left femoral vein; patient confirmed that he was taking Eliquis without any interruption  -I discussed with the patient about his limited anticoagulation options, including Lovenox vs. Lovenox bridging to warfarin -Patient ultimately decided to transition back to warfarin, and understands the importance of medication compliance -I have had several discussions with the anticoagulation clinic, who will continue to monitor INR and adjust his warfarin dose as needed   Chemotherapy-associated anemia -Secondary to chemotherapy -Hgb 12.0, lower than last visit  -Patient denies any symptom of bleeding -We will monitor for now  Chemotherapy-associated leukopenia -Secondary to chemotherapy -WBC 2.9k with ANC 1900, lower than last visit  -Patient denies any symptoms of infection -We will monitor for now  Hypomagnesemia -Secondary to electrolyte wasting from chemotherapy -Mg 1.6 today, mildly low  -Continue PO Mg 400mg  BID   Chemoradiation-associated mucositis -Secondary to chemoradiation -Patient has not taking morphine due to drowsiness -I recommended the patient to try viscous lidocaine swish and swallow, as his pain is primarily localized to the oropharynx  -He may also take occasional NSAID, as he has now completed chemoradiation, but I counseled him on avoiding excess NSAID use due to the risk of nephrotoxicity   Protein malnutrition -Secondary to chemoradiation -Weight stable since the last visit  -No feeding tube due to patient  preference -I encouraged the patient to maintain adequate PO intake as  tolerated and to adhere to the nutritional recommendations  No orders of the defined types were placed in this encounter.  All questions were answered. The patient knows to call the clinic with any problems, questions or concerns. No barriers to learning was detected.  Return in 3 weeks for labs, port flush, and clinic appt.   Tish Men, MD 01/18/2019 2:20 PM  CHIEF COMPLAINT: "I am doing all right"  INTERVAL HISTORY: [name] [name] returns to clinic for follow-up of squamous of carcinoma of the tonsil s/p definitive chemoradiation with weekly cisplatin.  Patient reports that he is able to eat and drink mostly without limitation, but his appetite has been limited and that the taste has been poor, which limits his p.o. intake.  He has mild pain in the oropharynx, exacerbated by eating and drinking, but he has not been taking any medication for it.  He has liquid morphine, but it makes him drowsy, and therefore he has not been taking it.  His left lower extremity pain is improving since starting Lovenox.  He denies any abnormal bleeding or bruising.  He denies any other complaint today.  SUMMARY OF ONCOLOGIC HISTORY: Oncology History  Malignant neoplasm of tonsillar fossa ([place])  10/10/2018 Imaging   CT neck w/ contrast: FINDINGS: Pharynx and larynx: There is asymmetric prominence of the left tonsillar region. No other mucosal or submucosal lesion is seen.  Salivary glands: Parotid and submandibular glands are normal.  Thyroid: Normal  Lymph nodes: Definite adenopathy on the left. 2 level nodes which shows some internal necrosis. The larger measures 2.2 cm in diameter and the smaller measures 1.4 cm in diameter. Suspicious level 4 node image 83 just anterior to the jugular vein measuring 9 mm short axis diameter. No worrisome nodes on the right.   10/10/2018 Imaging   CT chest:  IMPRESSION: 1. Mild right hilar adenopathy, equivocal for metastasis. No additional potential evidence of  metastatic disease in the chest. PET-CT could be considered for further staging evaluation, as clinically warranted. 2. 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 Ascending thoracic aortic aneurysm. Recommend semi-annual imaging followup by CTA or MRA and referral to cardiothoracic surgery if not already obtained. This recommendation follows 2010 ACCF/AHA/AATS/ACR/ASA/SCA/SCAI/SIR/STS/SVM Guidelines for the Diagnosis and Management of Patients With Thoracic Aortic Disease. Circulation. 2010; 121: C166-A630. Aortic aneurysm NOS (ICD10-I71.9).   10/17/2018 Initial Diagnosis   Tonsil cancer ([place])   10/18/2018 Imaging   PET: IMPRESSION: 1. 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 2. Local hypermetabolic nodal metastasis to the LEFT level II nodal station. 3. No contralateral nodal metastasis. 4. No evidence distant metastatic disease. 5. Two small pulmonary nodules in the LEFT lower lobe are favored benign. Recommend attention on follow-up. 6. Several small common iliac lymph nodes with low metabolic activity. Favor reactive adenopathy.   10/21/2018 Cancer Staging   Staging form: Pharynx - HPV-Mediated Oropharynx, AJCC 8th Edition - Clinical: Stage I (cT2, cN1, cM0, p16+) - Signed by Eppie Gibson, MD on 10/21/2018   11/10/2018 -  Chemotherapy   The patient had palonosetron (ALOXI) injection 0.25 mg, 0.25 mg, Intravenous,  Once, 6 of 7 cycles Administration: 0.25 mg (11/10/2018), 0.25 mg (11/17/2018), 0.25 mg (12/22/2018), 0.25 mg (11/24/2018), 0.25 mg (12/15/2018), 0.25 mg (12/29/2018) CISplatin (PLATINOL) 100 mg in sodium chloride 0.9 % 500 mL chemo infusion, 40 mg/m2 = 100 mg, Intravenous,  Once, 6 of 7 cycles Dose modification: 30 mg/m2 (original dose 40  mg/m2, Cycle 5, Reason: Dose not tolerated) Administration: 100 mg (11/10/2018), 100 mg (11/17/2018), 75 mg (12/22/2018), 100 mg (11/24/2018), 75 mg (12/15/2018), 75 mg (12/29/2018) fosaprepitant (EMEND) 150 mg,  dexamethasone (DECADRON) 12 mg in sodium chloride 0.9 % 145 mL IVPB, , Intravenous,  Once, 6 of 7 cycles Administration:  (11/10/2018),  (11/17/2018),  (12/22/2018),  (11/24/2018),  (12/15/2018),  (12/29/2018)  for chemotherapy treatment.      REVIEW OF SYSTEMS:   Constitutional: ( - ) fevers, ( - )  chills , ( - ) night sweats Eyes: ( - ) blurriness of vision, ( - ) double vision, ( - ) watery eyes Ears, nose, mouth, throat, and face: ( + ) mucositis, ( - ) sore throat Respiratory: ( - ) cough, ( - ) dyspnea, ( - ) wheezes Cardiovascular: ( - ) palpitation, ( - ) chest discomfort, ( - ) lower extremity swelling Gastrointestinal:  ( - ) nausea, ( - ) heartburn, ( - ) change in bowel habits Skin: ( - ) abnormal skin rashes Lymphatics: ( - ) new lymphadenopathy, ( - ) easy bruising Neurological: ( - ) numbness, ( - ) tingling, ( - ) new weaknesses Behavioral/Psych: ( - ) mood change, ( - ) new changes  All other systems were reviewed with the patient and are negative.  I have reviewed the past medical history, past surgical history, social history and family history with the patient and they are unchanged from previous note.  ALLERGIES:  has No Known Allergies.  MEDICATIONS:  Current Outpatient Medications  Medication Sig Dispense Refill  . amLODipine (NORVASC) 10 MG tablet TAKE 1 TABLET BY MOUTH EVERY DAY (Patient taking differently: Take 10 mg by mouth daily. ) 90 tablet 2  . dexamethasone (DECADRON) 4 MG tablet Take 2 tablets by mouth once a day on the day after chemotherapy and then take 2 tablets two times a day for 2 days. Take with food. (Patient not taking: Reported on 12/28/2018) 30 tablet 1  . Eliquis DVT/PE Starter Pack (ELIQUIS STARTER PACK) 5 MG TABS Take as directed on package: start with two-5mg  tablets twice daily for 7 days. On day 8, switch to one-5mg  tablet twice daily. 1 each 0  . enoxaparin (LOVENOX) 120 MG/0.8ML injection Inject 0.8 mLs (120 mg total) into the skin every 12  (twelve) hours for 21 days. 33.6 mL 0  . Fluticasone Propionate 0.05 % LOTN Apply 1 application topically 2 (two) times daily. Apply to rash twice daily as needed 120 mL 1  . ibuprofen (ADVIL) 200 MG tablet Take 600 mg by mouth every 6 (six) hours as needed for pain or fever.     . magnesium oxide (MAG-OX) 400 (241.3 Mg) MG tablet Take 1 tablet (400 mg total) by mouth 2 (two) times daily for 30 days. 60 tablet 2  . sodium fluoride (PREVIDENT 5000 PLUS) 1.1 % CREA dental cream Apply to tooth brush. Brush teeth for 2 minutes. Spit out excess. DO NOT rinse afterwards. Repeat nightly. 1 Tube prn  . triamcinolone lotion (KENALOG) 0.1 % Apply to affected area twice a day 60 mL 0   No current facility-administered medications for this visit.    Facility-Administered Medications Ordered in Other Visits  Medication Dose Route Frequency Provider Last Rate Last Dose  . heparin lock flush 100 unit/mL  500 Units Intravenous Once Tish Men, MD      . sodium chloride flush (NS) 0.9 % injection 10 mL  10 mL Intravenous PRN Tish Men,  MD        PHYSICAL EXAMINATION: ECOG PERFORMANCE STATUS: 1 - Symptomatic but completely ambulatory  Today's Vitals   01/18/19 1336 01/18/19 1343  BP: (!) 134/108   Pulse: 77   Resp: 17   Temp: 98.3 F (36.8 C)   TempSrc: Oral   SpO2: 100%   Weight: 237 lb 9.6 oz (107.8 kg)   Height: 6\' 3"  (1.905 m)   PainSc:  4    Body mass index is 29.7 kg/m.  Filed Weights   01/18/19 1336  Weight: 237 lb 9.6 oz (107.8 kg)    GENERAL: alert, no distress and comfortable SKIN: skin color, texture, turgor are normal, no rashes or significant lesions EYES: conjunctiva are pink and non-injected, sclera clear OROPHARYNX: Grade 1 mucositis   NECK: supple, non-tender LYMPH:  no palpable lymphadenopathy in the cervical LUNGS: clear to auscultation with normal breathing effort HEART: regular rate & rhythm and no murmurs and no lower extremity edema ABDOMEN: soft, non-tender,  non-distended, normal bowel sounds Musculoskeletal: no cyanosis of digits and no clubbing  PSYCH: alert & oriented x 3, fluent speech NEURO: no focal motor/sensory deficits  LABORATORY DATA:  I have reviewed the data as listed    Component Value Date/Time   NA 138 01/04/2019 1438   NA 142 10/03/2018 1450   NA 141 05/21/2014 1226   K 4.3 01/04/2019 1438   K 4.3 05/21/2014 1226   CL 100 01/04/2019 1438   CL 108 (H) 12/28/2012 1405   CO2 26 01/04/2019 1438   CO2 28 05/21/2014 1226   GLUCOSE 88 01/04/2019 1438   GLUCOSE 101 05/21/2014 1226   GLUCOSE 95 12/28/2012 1405   BUN 22 (H) 01/04/2019 1438   BUN 15 10/03/2018 1450   BUN 13.2 05/21/2014 1226   CREATININE 1.07 01/04/2019 1438   CREATININE 1.00 09/24/2014 1413   CREATININE 0.9 05/21/2014 1226   CALCIUM 9.2 01/04/2019 1438   CALCIUM 9.3 05/21/2014 1226   PROT 7.4 10/21/2018 1148   PROT 7.2 10/03/2018 1450   PROT 7.1 01/08/2014 1203   ALBUMIN 4.4 10/21/2018 1148   ALBUMIN 4.3 10/03/2018 1450   ALBUMIN 3.7 01/08/2014 1203   AST 20 10/21/2018 1148   AST 50 (H) 01/08/2014 1203   ALT 19 10/21/2018 1148   ALT 83 (H) 01/08/2014 1203   ALKPHOS 73 10/21/2018 1148   ALKPHOS 87 01/08/2014 1203   BILITOT 0.6 10/21/2018 1148   BILITOT 0.67 01/08/2014 1203   GFRNONAA >60 01/04/2019 1438   GFRNONAA >60 10/15/2011 0224   GFRAA >60 01/04/2019 1438   GFRAA >60 10/15/2011 0224    No results found for: SPEP, UPEP  Lab Results  Component Value Date   WBC 6.2 01/04/2019   NEUTROABS 5.2 01/04/2019   HGB 14.9 01/04/2019   HCT 43.2 01/04/2019   MCV 96.4 01/04/2019   PLT 85 (L) 01/04/2019      Chemistry      Component Value Date/Time   NA 138 01/04/2019 1438   NA 142 10/03/2018 1450   NA 141 05/21/2014 1226   K 4.3 01/04/2019 1438   K 4.3 05/21/2014 1226   CL 100 01/04/2019 1438   CL 108 (H) 12/28/2012 1405   CO2 26 01/04/2019 1438   CO2 28 05/21/2014 1226   BUN 22 (H) 01/04/2019 1438   BUN 15 10/03/2018 1450   BUN  13.2 05/21/2014 1226   CREATININE 1.07 01/04/2019 1438   CREATININE 1.00 09/24/2014 1413   CREATININE 0.9 05/21/2014 1226  Component Value Date/Time   CALCIUM 9.2 01/04/2019 1438   CALCIUM 9.3 05/21/2014 1226   ALKPHOS 73 10/21/2018 1148   ALKPHOS 87 01/08/2014 1203   AST 20 10/21/2018 1148   AST 50 (H) 01/08/2014 1203   ALT 19 10/21/2018 1148   ALT 83 (H) 01/08/2014 1203   BILITOT 0.6 10/21/2018 1148   BILITOT 0.67 01/08/2014 1203       RADIOGRAPHIC STUDIES: I have personally reviewed the radiological images as listed below and agreed with the findings in the report. Ct Angio Ao+bifem W & Or Wo Contrast  Result Date: 12/23/2018 CLINICAL DATA:  58 year old with lower extremity DVT. Recent venous duplex examination suggested occlusion of the right common femoral artery, right superficial femoral artery and right popliteal artery. EXAM: CT ANGIOGRAPHY OF ABDOMINAL AORTA WITH ILIOFEMORAL RUNOFF TECHNIQUE: Multidetector CT imaging of the abdomen, pelvis and lower extremities was performed using the standard protocol during bolus administration of intravenous contrast. Multiplanar CT image reconstructions and MIPs were obtained to evaluate the vascular anatomy. CONTRAST:  14mL OMNIPAQUE IOHEXOL 350 MG/ML SOLN COMPARISON:  Venous duplex report from 12/22/2018. PET-CT 10/18/2018 FINDINGS: VASCULAR Aorta: Aorta at the hiatus measures 2.9 cm. Abdominal aorta near the bifurcation measures 3 cm with a small amount of mural thrombus. Negative for an aortic dissection. Celiac: Patent without evidence of aneurysm, dissection, vasculitis or significant stenosis. Incidentally, there is a replaced left hepatic artery from the left gastric artery. SMA: Patent without evidence of aneurysm, dissection, vasculitis or significant stenosis. Incidentally, there is a replaced right hepatic artery. Renals: Both renal arteries are patent without evidence of aneurysm, dissection, vasculitis, fibromuscular dysplasia  or significant stenosis. IMA: Patent without evidence of aneurysm, dissection, vasculitis or significant stenosis. 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 Peripheral thrombus associated with the right common iliac artery aneurysm. Aneurysm of the right internal iliac artery trunk measuring up to 1.7 cm. Right internal iliac artery aneurysm is best seen on the coronal reformats, sequence 7, image 142. Right external iliac artery is widely patent measures up to 1.1 cm. Outflow: Peripheral mural thrombus in the right common femoral artery. Right common femoral artery is patent. The right profunda femoral artery is patent. Occlusion of the right SFA at the origin. The entire right SFA is occluded. No significant flow identified in the proximal and mid popliteal artery. Calcifications associated the popliteal artery compatible with underlying aneurysm. The distal popliteal artery at the level of the knee joint is small and patent. Runoff: Runoff vessels cannot be accurately evaluated due to the timing of the study. LEFT Lower Extremity Inflow: Left common and external iliac artery are patent. The left internal iliac artery is occluded at the origin. There is reconstitution of the left internal iliac artery branches. Probable aneurysm involving the occluded left internal iliac artery measuring up to 1.9 cm. Outflow: Left common femoral artery is patent. The proximal left SFA and the proximal left profunda femoral artery are patent. Unfortunately, there is no significant contrast in left lower extremity beyond the mid thigh but this is most likely related to the timing of the study rather than an occlusion. Left popliteal artery cannot be evaluated. Runoff: Left runoff vessels cannot be evaluated due to timing of the study. Veins: No obvious venous abnormality within the limitations of this arterial phase study. Review of the MIP images confirms the above findings.  NON-VASCULAR Lower chest: Limited evaluation of the lung bases. Hepatobiliary: Liver is only partially  visualized. Gallbladder appears normal without distension. Pancreas: Unremarkable. No pancreatic ductal dilatation or surrounding inflammatory changes. Spleen: Spleen is partially visualized without gross abnormality. Adrenals/Urinary Tract: Normal adrenal glands. Exophytic hypodensity in the right kidney is likely related to a cyst and measures up to 3.1 cm. Limited evaluation of the kidneys due to motion artifact. Urinary bladder is unremarkable. Exophytic low-density structure in left kidney probably represents a cyst. Stomach/Bowel: Moderate amount of stool in the rectum. No evidence for bowel obstruction or focal bowel inflammation. Lymphatic: No abdominopelvic lymphadenopathy. Reproductive: Prostate is unremarkable. Other: Left inguinal hernia containing fat. Negative for ascites. Negative for free air. Umbilical hernia containing fat. Musculoskeletal: No acute abnormality. IMPRESSION: VASCULAR 1. Occluded right SFA. The proximal and mid right popliteal artery are occluded. Probable aneurysm associated the right popliteal artery. 2. Limited evaluation of the bilateral runoff vessels due to the timing of the study and technical issues. 3. Limited evaluation of the left lower extremity beyond the proximal let thigh. Left outflow and runoff vessels cannot be accurately evaluated. 4. 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 Aneurysm of the right internal iliac artery measuring 1.7 cm. 5. Occlusion of the proximal left internal iliac artery. Probable aneurysm in the left internal iliac artery measuring up to 1.9 cm. 6. Abdominal aortic aneurysm measuring 3 cm. NON-VASCULAR 1. Exophytic renal cysts. 2. Umbilical hernia containing fat. Left inguinal hernia containing fat. Electronically Signed   By: Markus Daft M.D.   On: 12/23/2018 14:16   Vas [place] Lower Extremity Venous (dvt)  Result  Date: 01/15/2019  Lower Venous Study Indications: Pain, and Swelling.  Comparison Study: lower extremity venous duplex 12/22/2018. Performing Technologist: Maudry Mayhew MHA, RDMS, RVT, RDCS  Examination Guidelines: A complete evaluation includes B-mode imaging, spectral Doppler, color Doppler, and power Doppler as needed of all accessible portions of each vessel. Bilateral testing is considered an integral part of a complete examination. Limited examinations for reoccurring indications may be performed as noted.  +-----+---------------+---------+-----------+----------+-------+ RIGHTCompressibilityPhasicitySpontaneityPropertiesSummary +-----+---------------+---------+-----------+----------+-------+ CFV  Full           Yes      Yes                          +-----+---------------+---------+-----------+----------+-------+   +---------+---------------+---------+-----------+----------+-------+ LEFT     CompressibilityPhasicitySpontaneityPropertiesSummary +---------+---------------+---------+-----------+----------+-------+ CFV      Full           Yes      Yes                          +---------+---------------+---------+-----------+----------+-------+ SFJ      Full                                                 +---------+---------------+---------+-----------+----------+-------+ FV Prox  None                    No                   Acute   +---------+---------------+---------+-----------+----------+-------+ FV Mid   None                    No                   Acute   +---------+---------------+---------+-----------+----------+-------+  FV DistalNone                    No                   Acute   +---------+---------------+---------+-----------+----------+-------+ PFV      Full                                                 +---------+---------------+---------+-----------+----------+-------+ POP      None                    No                    Acute   +---------+---------------+---------+-----------+----------+-------+ PTV      None                    No                   Acute   +---------+---------------+---------+-----------+----------+-------+ PERO     None                    No                   Acute   +---------+---------------+---------+-----------+----------+-------+ Gastroc  None                    No                   Acute   +---------+---------------+---------+-----------+----------+-------+     Summary: Right: No evidence of common femoral vein obstruction. Left: Findings consistent with acute deep vein thrombosis involving the left femoral vein, left popliteal vein, left posterior tibial veins, left peroneal veins, and left gastrocnemius veins. No cystic structure found in the popliteal fossa.  When compared to prior study, DVT has extended through the left lower extremity. *See table(s) above for measurements and observations. Electronically signed by Curt Jews MD on 01/15/2019 at 9:08:43 AM.    Final    Vas [place] Lower Extremity Venous (dvt)  Result Date: 12/23/2018  Lower Venous Study Indications: Pain.  Risk Factors: DVT History of DVT and PE. Performing Technologist: Maudry Mayhew MHA, RDMS, RVT, RDCS  Examination Guidelines: A complete evaluation includes B-mode imaging, spectral Doppler, color Doppler, and power Doppler as needed of all accessible portions of each vessel. Bilateral testing is considered an integral part of a complete examination. Limited examinations for reoccurring indications may be performed as noted.  +---------+---------------+---------+-----------+----------+-------+ RIGHT    CompressibilityPhasicitySpontaneityPropertiesSummary +---------+---------------+---------+-----------+----------+-------+ CFV      Full           Yes      Yes                          +---------+---------------+---------+-----------+----------+-------+ SFJ      Full                                                  +---------+---------------+---------+-----------+----------+-------+ FV Prox  Full                                                 +---------+---------------+---------+-----------+----------+-------+  FV Mid   Full                                                 +---------+---------------+---------+-----------+----------+-------+ FV DistalFull                                                 +---------+---------------+---------+-----------+----------+-------+ PFV      Full                                                 +---------+---------------+---------+-----------+----------+-------+ POP      Full           Yes      Yes                          +---------+---------------+---------+-----------+----------+-------+ PTV      Full                                                 +---------+---------------+---------+-----------+----------+-------+ PERO     None                    No                   Acute   +---------+---------------+---------+-----------+----------+-------+   +---------+---------------+---------+-----------+----------+-------+ LEFT     CompressibilityPhasicitySpontaneityPropertiesSummary +---------+---------------+---------+-----------+----------+-------+ CFV      Full           Yes      Yes                          +---------+---------------+---------+-----------+----------+-------+ SFJ      Full                                                 +---------+---------------+---------+-----------+----------+-------+ FV Prox  Full                                                 +---------+---------------+---------+-----------+----------+-------+ FV Mid   Full                                                 +---------+---------------+---------+-----------+----------+-------+ FV DistalFull                                                  +---------+---------------+---------+-----------+----------+-------+ PFV      Full                                                 +---------+---------------+---------+-----------+----------+-------+  POP      Full           Yes      Yes                          +---------+---------------+---------+-----------+----------+-------+ PTV      Full                                                 +---------+---------------+---------+-----------+----------+-------+ PERO     Full                                                 +---------+---------------+---------+-----------+----------+-------+ Gastroc  None                                         Acute   +---------+---------------+---------+-----------+----------+-------+     Summary: Right: Findings consistent with acute deep vein thrombosis involving the right peroneal vein. No cystic structure found in the popliteal fossa. 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 Right distal PTA is patent with monophasic flow. Left: Findings consistent with acute deep vein thrombosis involving the left gastrocnemius vein.  *See table(s) above for measurements and observations. Electronically signed by Servando Snare MD on 12/23/2018 at 12:25:42 PM.    Final

## 2019-01-17 NOTE — Telephone Encounter (Signed)
Thank you for everyone's help.

## 2019-01-18 ENCOUNTER — Other Ambulatory Visit: Payer: Self-pay

## 2019-01-18 ENCOUNTER — Telehealth: Payer: Self-pay | Admitting: *Deleted

## 2019-01-18 ENCOUNTER — Inpatient Hospital Stay: Payer: Medicaid Other

## 2019-01-18 ENCOUNTER — Encounter: Payer: Self-pay | Admitting: Hematology

## 2019-01-18 ENCOUNTER — Inpatient Hospital Stay: Payer: Medicaid Other | Attending: Hematology | Admitting: Hematology

## 2019-01-18 VITALS — BP 134/108 | HR 77 | Temp 98.3°F | Resp 17 | Ht 75.0 in | Wt 237.6 lb

## 2019-01-18 DIAGNOSIS — D6481 Anemia due to antineoplastic chemotherapy: Secondary | ICD-10-CM | POA: Diagnosis not present

## 2019-01-18 DIAGNOSIS — E46 Unspecified protein-calorie malnutrition: Secondary | ICD-10-CM | POA: Diagnosis not present

## 2019-01-18 DIAGNOSIS — K1231 Oral mucositis (ulcerative) due to antineoplastic therapy: Secondary | ICD-10-CM

## 2019-01-18 DIAGNOSIS — D701 Agranulocytosis secondary to cancer chemotherapy: Secondary | ICD-10-CM | POA: Diagnosis not present

## 2019-01-18 DIAGNOSIS — C090 Malignant neoplasm of tonsillar fossa: Secondary | ICD-10-CM | POA: Insufficient documentation

## 2019-01-18 DIAGNOSIS — Z79899 Other long term (current) drug therapy: Secondary | ICD-10-CM | POA: Diagnosis not present

## 2019-01-18 DIAGNOSIS — I825Y1 Chronic embolism and thrombosis of unspecified deep veins of right proximal lower extremity: Secondary | ICD-10-CM | POA: Insufficient documentation

## 2019-01-18 DIAGNOSIS — Z95828 Presence of other vascular implants and grafts: Secondary | ICD-10-CM | POA: Insufficient documentation

## 2019-01-18 DIAGNOSIS — I82811 Embolism and thrombosis of superficial veins of right lower extremities: Secondary | ICD-10-CM

## 2019-01-18 LAB — CBC WITH DIFFERENTIAL (CANCER CENTER ONLY)
Abs Immature Granulocytes: 0.01 10*3/uL (ref 0.00–0.07)
Basophils Absolute: 0 10*3/uL (ref 0.0–0.1)
Basophils Relative: 0 %
Eosinophils Absolute: 0 10*3/uL (ref 0.0–0.5)
Eosinophils Relative: 1 %
HCT: 34.8 % — ABNORMAL LOW (ref 39.0–52.0)
Hemoglobin: 12 g/dL — ABNORMAL LOW (ref 13.0–17.0)
Immature Granulocytes: 0 %
Lymphocytes Relative: 14 %
Lymphs Abs: 0.4 10*3/uL — ABNORMAL LOW (ref 0.7–4.0)
MCH: 33.8 pg (ref 26.0–34.0)
MCHC: 34.5 g/dL (ref 30.0–36.0)
MCV: 98 fL (ref 80.0–100.0)
Monocytes Absolute: 0.5 10*3/uL (ref 0.1–1.0)
Monocytes Relative: 19 %
Neutro Abs: 1.9 10*3/uL (ref 1.7–7.7)
Neutrophils Relative %: 66 %
Platelet Count: 161 10*3/uL (ref 150–400)
RBC: 3.55 MIL/uL — ABNORMAL LOW (ref 4.22–5.81)
RDW: 17.9 % — ABNORMAL HIGH (ref 11.5–15.5)
WBC Count: 2.9 10*3/uL — ABNORMAL LOW (ref 4.0–10.5)
nRBC: 0 % (ref 0.0–0.2)

## 2019-01-18 LAB — CMP (CANCER CENTER ONLY)
ALT: 16 U/L (ref 0–44)
AST: 14 U/L — ABNORMAL LOW (ref 15–41)
Albumin: 3.2 g/dL — ABNORMAL LOW (ref 3.5–5.0)
Alkaline Phosphatase: 82 U/L (ref 38–126)
Anion gap: 13 (ref 5–15)
BUN: 13 mg/dL (ref 6–20)
CO2: 26 mmol/L (ref 22–32)
Calcium: 9.3 mg/dL (ref 8.9–10.3)
Chloride: 101 mmol/L (ref 98–111)
Creatinine: 0.83 mg/dL (ref 0.61–1.24)
GFR, Est AFR Am: >60 mL/min (ref >60)
GFR, Estimated: >60 mL/min (ref >60)
Glucose, Bld: 87 mg/dL (ref 70–99)
Potassium: 3.8 mmol/L (ref 3.5–5.1)
Sodium: 140 mmol/L (ref 135–145)
Total Bilirubin: 0.7 mg/dL (ref 0.3–1.2)
Total Protein: 6.4 g/dL — ABNORMAL LOW (ref 6.5–8.1)

## 2019-01-18 LAB — MAGNESIUM: Magnesium: 1.6 mg/dL — ABNORMAL LOW (ref 1.7–2.4)

## 2019-01-18 LAB — T4, FREE: Free T4: 1.1 ng/dL (ref 0.61–1.12)

## 2019-01-18 MED ORDER — SODIUM CHLORIDE 0.9% FLUSH
10.0000 mL | INTRAVENOUS | Status: DC | PRN
  Administered 2019-01-18: 10 mL
  Filled 2019-01-18: qty 10

## 2019-01-18 MED ORDER — HEPARIN SOD (PORK) LOCK FLUSH 100 UNIT/ML IV SOLN
500.0000 [IU] | Freq: Once | INTRAVENOUS | Status: AC | PRN
  Administered 2019-01-18: 500 [IU]
  Filled 2019-01-18: qty 5

## 2019-01-18 NOTE — Telephone Encounter (Signed)
TCT pt and then his significant other. No answer with pt's #. LVM with Manual Meier informing them that pt needs to come in a little bit earlier for lab work prior to his appt with Dr. Maylon Peppers. Lab work scheduled at 12:15 with port flush

## 2019-01-19 ENCOUNTER — Telehealth: Payer: Self-pay | Admitting: Hematology

## 2019-01-19 LAB — TSH: TSH: 1.404 u[IU]/mL (ref 0.320–4.118)

## 2019-01-19 NOTE — Telephone Encounter (Signed)
Called and left msg for patient regarding appts

## 2019-01-23 ENCOUNTER — Telehealth (HOSPITAL_COMMUNITY): Payer: Self-pay | Admitting: Dentistry

## 2019-01-23 NOTE — Telephone Encounter (Signed)
01/23/2019  Patient:            [name] [name] Date of Birth:  1961/01/02 MRN:                [id number]   [facility] to call back and schedule follow up dental examination.  Dr. Enrique Sack

## 2019-01-24 ENCOUNTER — Telehealth: Payer: Self-pay | Admitting: *Deleted

## 2019-01-24 NOTE — Telephone Encounter (Signed)
-----   Message from Tish Men, MD sent at 01/18/2019  4:42 PM EDT ----- Jamas Lav,  Can you let Mr. Tanimoto know that his Mg level is still low, and he needs to continue his oral Mg supplement twice a day?Thanks.  Forrest  ----- Message ----- From: Buel Ream, Lab In [place] Sent: 01/18/2019   3:48 PM EDT To: Tish Men, MD

## 2019-01-24 NOTE — Telephone Encounter (Signed)
TCT patient regarding lab results from 01/18/19.  No answer but was able to leave message on identified phone #.  Pt's Magnesium remains low. Advised to continue taking magnesium supplements 2 x a day. Advised patient to call back @ [phone] if he had any questions or concerns

## 2019-02-01 ENCOUNTER — Telehealth: Payer: Self-pay | Admitting: Hematology

## 2019-02-01 ENCOUNTER — Telehealth: Payer: Self-pay | Admitting: Radiation Oncology

## 2019-02-01 NOTE — Telephone Encounter (Signed)
New message:    LVM for patient to call back to schedule follow up visit per patients request on vcmail left by patient on 01/31/19

## 2019-02-01 NOTE — Telephone Encounter (Signed)
Called pt per 7/14 sch message - unable to reach pt . Left message for patient to call back for reschedule.

## 2019-02-06 ENCOUNTER — Other Ambulatory Visit: Payer: Self-pay

## 2019-02-06 ENCOUNTER — Ambulatory Visit (INDEPENDENT_AMBULATORY_CARE_PROVIDER_SITE_OTHER): Payer: Medicaid Other | Admitting: Internal Medicine

## 2019-02-06 VITALS — BP 106/77 | HR 88 | Temp 97.9°F | Ht 75.0 in | Wt 238.8 lb

## 2019-02-06 DIAGNOSIS — I712 Thoracic aortic aneurysm, without rupture, unspecified: Secondary | ICD-10-CM

## 2019-02-06 DIAGNOSIS — Z79899 Other long term (current) drug therapy: Secondary | ICD-10-CM | POA: Diagnosis not present

## 2019-02-06 DIAGNOSIS — M79605 Pain in left leg: Secondary | ICD-10-CM

## 2019-02-06 DIAGNOSIS — I82409 Acute embolism and thrombosis of unspecified deep veins of unspecified lower extremity: Secondary | ICD-10-CM | POA: Insufficient documentation

## 2019-02-06 DIAGNOSIS — Z7901 Long term (current) use of anticoagulants: Secondary | ICD-10-CM

## 2019-02-06 DIAGNOSIS — I10 Essential (primary) hypertension: Secondary | ICD-10-CM | POA: Diagnosis present

## 2019-02-06 DIAGNOSIS — E785 Hyperlipidemia, unspecified: Secondary | ICD-10-CM | POA: Diagnosis not present

## 2019-02-06 DIAGNOSIS — Z86718 Personal history of other venous thrombosis and embolism: Secondary | ICD-10-CM

## 2019-02-06 DIAGNOSIS — Z9221 Personal history of antineoplastic chemotherapy: Secondary | ICD-10-CM | POA: Diagnosis not present

## 2019-02-06 DIAGNOSIS — C090 Malignant neoplasm of tonsillar fossa: Secondary | ICD-10-CM

## 2019-02-06 DIAGNOSIS — I7121 Aneurysm of the ascending aorta, without rupture: Secondary | ICD-10-CM | POA: Insufficient documentation

## 2019-02-06 DIAGNOSIS — Z720 Tobacco use: Secondary | ICD-10-CM | POA: Diagnosis not present

## 2019-02-06 DIAGNOSIS — Z923 Personal history of irradiation: Secondary | ICD-10-CM | POA: Diagnosis not present

## 2019-02-06 DIAGNOSIS — E7800 Pure hypercholesterolemia, unspecified: Secondary | ICD-10-CM

## 2019-02-06 DIAGNOSIS — F1010 Alcohol abuse, uncomplicated: Secondary | ICD-10-CM | POA: Diagnosis not present

## 2019-02-06 DIAGNOSIS — E782 Mixed hyperlipidemia: Secondary | ICD-10-CM

## 2019-02-06 LAB — PROTIME-INR
INR: 2.1 — ABNORMAL HIGH (ref 0.8–1.2)
Prothrombin Time: 23.4 seconds — ABNORMAL HIGH (ref 11.4–15.2)

## 2019-02-06 NOTE — Patient Instructions (Signed)
We will get some blood work to look at your PT/INR. I'll also check your lipids while you are here. I am also sending a referral to cardiothoracic surgery to see you for your the aortic aneurysm.

## 2019-02-06 NOTE — Assessment & Plan Note (Signed)
Incidental finding from a CT chest.  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 Ascending thoracic aortic aneurysm. Recommend semi-annual imaging followup by CTA or MRA and referral to cardiothoracic surgery if not already obtained. Patient denies any chest pain at this time. Plan: Referral to CT surgery placed.  Further management pending their evaluation.

## 2019-02-06 NOTE — Assessment & Plan Note (Signed)
58 year old male with history of recurrent blood clots.  He was previously on warfarin for several years however he was recently switched to Eliquis.  Shortly after that, he developed another DVT (12/2018) at which time he was switched back to warfarin with a heparin bridge. Today he notes that he ran out of Lovenox last week, and he has been taking 10 mg of warfarin daily.  Does not appear that he has had his/INR checked since mid June.  INR was about 1.1 at that point.  I believe this was prior to him starting the warfarin therapy.  Today he is complaining of increased left leg pain.  From his previous DVT of 3 weeks ago had improved last week however about 2 days after being done with Lovenox, he feels like the left leg became more painful again.  He denies any fevers, chills, dyspnea, chest pain.  Physical exam was rather benign however left leg is tenderness.  Patient notes that this is approximately how it appeared prior to the onset of his symptoms.  Plan: Will repeat his PT/INR will also get him an appointment with Dr. gross, pharmacist, for closer evaluation of his warfarin.  Addendum: INR 2.1 today.  Will leave his current warfarin dose at 10 mg.

## 2019-02-06 NOTE — Progress Notes (Signed)
   CC: Left leg pain  HPI:  [name] [name] is a 58 y.o. with a past medical history including tonsillar fossa squamous cell carcinoma, recurrent DVT, essential hypertension, hyperlipidemia.  Presents today for establishment of care.  He also wishes to have his left leg pain evaluated.  He was found to have a DVT of his left leg about 3 weeks ago.  At that time, the Eliquis for which she was taking for DVTs, was stopped and he was bridged to warfarin with Lovenox.  His last dose of Lovenox was about 5 days ago.  To me again taking warfarin 10 mg daily.  About 2 days after he discontinued the Lovenox, his left leg started to become more painful again.  He denies any dyspnea, fevers, or chest pain.  Denies increased swelling of the left leg.  This issue is currently being managed by his oncologist however he wishes for further evaluation by the internal medicine clinic.  In regards to the tonsillar squamous cell carcinoma, he is completed his chemotherapy and radiation treatments per his report.  He notes that he is starting to have less fatigue and has been able to eat better.  He is following up regularly with the oncologist radiation oncologist.    Past Medical History:  Diagnosis Date  . 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 Benign essential HTN 01/04/2012  . Chronic anticoagulation 01/02/2013  . Dermatofibroma of forearm 01/02/2013   Left side  . Hyperlipidemia, mixed 01/04/2012  . 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 Primary hypercoagulable state ([place]) 10/01/2014  . Sinus bradycardia, chronic 01/04/2012  . Superficial thrombosis of lower extremity 05/02/2012   Review of Systems:  negative other than those stated in HPI  Physical Exam:  Vitals:   02/06/19 1458  BP: 106/77  Pulse: 88  Temp: 97.9 F (36.6 C)   TempSrc: Oral  SpO2: 99%  Weight: 238 lb 12.8 oz (108.3 kg)  Height: 6\' 3"  (1.905 m)    GENERAL: well appearing, in no apparent distress HEENT: no conjunctival injection. Nares patent.  CARDIAC: heart regular rate and rhythm, no peripheral edema appreciated PULMONARY: lung sounds clear to auscultation ABDOMEN: bowel sounds active.  SKIN: no rash or lesion on limited exam NEURO: CN II-XII grossly intact   Assessment & Plan:   See Encounters Tab for problem based charting.  Pertinent labs & imaging results that were available during my care of the patient were reviewed by me and considered in my medical decision making  Patient is in agreement with the plan and endorses no further questions at this time.  Patient seen with Dr. Clista Bernhardt, MD Internal Medicine Resident-PGY1 02/06/19

## 2019-02-06 NOTE — Assessment & Plan Note (Signed)
Patient said he has stopped smoking recently.  He is currently using nicotine gum.  Offered him our clinics resources should he feel that he needs them in the future.

## 2019-02-06 NOTE — Assessment & Plan Note (Signed)
Essential hypertension.  Currently taking amlodipine 10 mg daily.  Blood pressure is 106/77 in the clinic today. Plan: Continue current regimen.

## 2019-02-06 NOTE — Assessment & Plan Note (Signed)
We briefly discussed this today.  Patient reports that he is drinking around 10-12 beers a week.

## 2019-02-07 ENCOUNTER — Telehealth: Payer: Self-pay | Admitting: Internal Medicine

## 2019-02-07 ENCOUNTER — Telehealth: Payer: Self-pay | Admitting: *Deleted

## 2019-02-07 ENCOUNTER — Other Ambulatory Visit: Payer: Self-pay | Admitting: Internal Medicine

## 2019-02-07 DIAGNOSIS — E785 Hyperlipidemia, unspecified: Secondary | ICD-10-CM

## 2019-02-07 DIAGNOSIS — E7800 Pure hypercholesterolemia, unspecified: Secondary | ICD-10-CM | POA: Insufficient documentation

## 2019-02-07 LAB — LIPID PANEL
Chol/HDL Ratio: 5.4 ratio — ABNORMAL HIGH (ref 0.0–5.0)
Cholesterol, Total: 199 mg/dL (ref 100–199)
HDL: 37 mg/dL — ABNORMAL LOW
LDL Calculated: 98 mg/dL (ref 0–99)
Triglycerides: 321 mg/dL — ABNORMAL HIGH (ref 0–149)
VLDL Cholesterol Cal: 64 mg/dL — ABNORMAL HIGH (ref 5–40)

## 2019-02-07 MED ORDER — ATORVASTATIN CALCIUM 20 MG PO TABS: 20.0000 mg | ORAL_TABLET | Freq: Every day | ORAL | 11 refills | Status: DC

## 2019-02-07 NOTE — Progress Notes (Signed)
Internal Medicine Clinic Attending  I saw and evaluated the patient.  I personally confirmed the key portions of the history and exam documented by Dr. Darrick Meigs and I reviewed pertinent patient test results.  The assessment, diagnosis, and plan were formulated together and I agree with the documentation in the resident's note.  Unfortunately, developed recurrent DVT on apixiban, now back on warfarin after enoxaparin bridge. INR therapeutic at 2.1, will continue current dose and manage through our warfarin clinic. If leg swelling and pain do not improve, will consider repeat imaging to evaluate clot burden and consider intervention.   Lenice Pressman, M.D., Ph.D.

## 2019-02-07 NOTE — Telephone Encounter (Signed)
Patient notified of below. Hubbard Hartshorn, RN, BSN

## 2019-02-07 NOTE — Telephone Encounter (Signed)
Spoke with [name] [name] regarding his lab results. Will start on atorvastatin 20mg . Discussed potential myalgias. Patient in agreement with the plan and has no further questions at this time.

## 2019-02-07 NOTE — Assessment & Plan Note (Signed)
10-year cardiovascular risk is 10.3%.  This is based off his most recent lipid panel, history of smoking, hypertension.  Results received after patient left the office however I did give him a call personally and explained to him the lipid panel results and discuss treatment options.  Plan: will start atorvastatin 20 mg daily

## 2019-02-07 NOTE — Telephone Encounter (Signed)
-----   Message from [name], MD sent at 02/06/2019  6:19 PM EDT ----- Please let Mr. [name] [name] know that INR was 2.1. Will continue on his current dose of warfarin.

## 2019-02-07 NOTE — Telephone Encounter (Signed)
Called [name] [name] to discuss lab results and starting a statin. No answer. Left voicemail to have him call us back at clinic.

## 2019-02-08 ENCOUNTER — Other Ambulatory Visit: Payer: Medicaid Other

## 2019-02-08 ENCOUNTER — Inpatient Hospital Stay: Payer: Medicaid Other | Admitting: Hematology

## 2019-02-08 ENCOUNTER — Ambulatory Visit: Payer: Medicaid Other | Admitting: Hematology

## 2019-02-08 ENCOUNTER — Other Ambulatory Visit: Payer: Self-pay | Admitting: *Deleted

## 2019-02-08 ENCOUNTER — Inpatient Hospital Stay: Payer: Medicaid Other

## 2019-02-08 ENCOUNTER — Telehealth: Payer: Self-pay | Admitting: *Deleted

## 2019-02-08 DIAGNOSIS — C090 Malignant neoplasm of tonsillar fossa: Secondary | ICD-10-CM

## 2019-02-08 NOTE — Telephone Encounter (Signed)
Pt was NO  SHOW today.  Called pt and left message on voice mail asking pt to call nurse back for rescheduling appts.

## 2019-02-15 ENCOUNTER — Inpatient Hospital Stay: Payer: Medicaid Other

## 2019-02-15 ENCOUNTER — Other Ambulatory Visit: Payer: Self-pay

## 2019-02-15 ENCOUNTER — Inpatient Hospital Stay (HOSPITAL_BASED_OUTPATIENT_CLINIC_OR_DEPARTMENT_OTHER): Payer: Medicaid Other | Admitting: Hematology

## 2019-02-15 ENCOUNTER — Encounter: Payer: Self-pay | Admitting: Hematology

## 2019-02-15 ENCOUNTER — Inpatient Hospital Stay: Payer: Medicaid Other | Admitting: Hematology

## 2019-02-15 VITALS — BP 106/81 | HR 93 | Temp 98.3°F | Resp 18 | Ht 75.0 in | Wt 240.5 lb

## 2019-02-15 DIAGNOSIS — Z95828 Presence of other vascular implants and grafts: Secondary | ICD-10-CM

## 2019-02-15 DIAGNOSIS — I825Y1 Chronic embolism and thrombosis of unspecified deep veins of right proximal lower extremity: Secondary | ICD-10-CM

## 2019-02-15 DIAGNOSIS — C090 Malignant neoplasm of tonsillar fossa: Secondary | ICD-10-CM

## 2019-02-15 DIAGNOSIS — D7589 Other specified diseases of blood and blood-forming organs: Secondary | ICD-10-CM

## 2019-02-15 DIAGNOSIS — K1231 Oral mucositis (ulcerative) due to antineoplastic therapy: Secondary | ICD-10-CM | POA: Diagnosis not present

## 2019-02-15 DIAGNOSIS — I82811 Embolism and thrombosis of superficial veins of right lower extremities: Secondary | ICD-10-CM

## 2019-02-15 LAB — CMP (CANCER CENTER ONLY)
ALT: 18 U/L (ref 0–44)
AST: 14 U/L — ABNORMAL LOW (ref 15–41)
Albumin: 3.5 g/dL (ref 3.5–5.0)
Alkaline Phosphatase: 87 U/L (ref 38–126)
Anion gap: 9 (ref 5–15)
BUN: 14 mg/dL (ref 6–20)
CO2: 26 mmol/L (ref 22–32)
Calcium: 9.8 mg/dL (ref 8.9–10.3)
Chloride: 105 mmol/L (ref 98–111)
Creatinine: 0.87 mg/dL (ref 0.61–1.24)
GFR, Est AFR Am: >60 mL/min (ref >60)
GFR, Estimated: >60 mL/min (ref >60)
Glucose, Bld: 86 mg/dL (ref 70–99)
Potassium: 4.7 mmol/L (ref 3.5–5.1)
Sodium: 140 mmol/L (ref 135–145)
Total Bilirubin: 0.4 mg/dL (ref 0.3–1.2)
Total Protein: 6.8 g/dL (ref 6.5–8.1)

## 2019-02-15 LAB — CBC WITH DIFFERENTIAL (CANCER CENTER ONLY)
Abs Immature Granulocytes: 0.02 10*3/uL (ref 0.00–0.07)
Basophils Absolute: 0 10*3/uL (ref 0.0–0.1)
Basophils Relative: 1 %
Eosinophils Absolute: 0.1 10*3/uL (ref 0.0–0.5)
Eosinophils Relative: 2 %
HCT: 40.2 % (ref 39.0–52.0)
Hemoglobin: 13.8 g/dL (ref 13.0–17.0)
Immature Granulocytes: 0 %
Lymphocytes Relative: 13 %
Lymphs Abs: 0.7 10*3/uL (ref 0.7–4.0)
MCH: 35.5 pg — ABNORMAL HIGH (ref 26.0–34.0)
MCHC: 34.3 g/dL (ref 30.0–36.0)
MCV: 103.3 fL — ABNORMAL HIGH (ref 80.0–100.0)
Monocytes Absolute: 0.8 10*3/uL (ref 0.1–1.0)
Monocytes Relative: 14 %
Neutro Abs: 4.1 10*3/uL (ref 1.7–7.7)
Neutrophils Relative %: 70 %
Platelet Count: 176 10*3/uL (ref 150–400)
RBC: 3.89 MIL/uL — ABNORMAL LOW (ref 4.22–5.81)
RDW: 17.1 % — ABNORMAL HIGH (ref 11.5–15.5)
WBC Count: 5.8 10*3/uL (ref 4.0–10.5)
nRBC: 0 % (ref 0.0–0.2)

## 2019-02-15 LAB — PROTIME-INR
INR: 3.9 — ABNORMAL HIGH (ref 0.8–1.2)
Prothrombin Time: 37.8 seconds — ABNORMAL HIGH (ref 11.4–15.2)

## 2019-02-15 MED ORDER — SODIUM CHLORIDE 0.9% FLUSH
10.0000 mL | INTRAVENOUS | Status: DC | PRN
  Administered 2019-02-15: 10 mL
  Filled 2019-02-15: qty 10

## 2019-02-15 MED ORDER — HEPARIN SOD (PORK) LOCK FLUSH 100 UNIT/ML IV SOLN
500.0000 [IU] | Freq: Once | INTRAVENOUS | Status: AC | PRN
  Administered 2019-02-15: 15:00:00 500 [IU]
  Filled 2019-02-15: qty 5

## 2019-02-15 NOTE — Progress Notes (Signed)
[name] [name] OFFICE PROGRESS NOTE  Patient Care Team: Default, Provider, MD as PCP - General Beryle Beams, Alyson Locket, MD as Consulting Physician (Oncology) Leta Baptist, MD as Consulting Physician (Otolaryngology) Eppie Gibson, MD as Attending Physician (Radiation Oncology) Tish Men, MD as Consulting Physician (Hematology) Leota Sauers, RN as Oncology Nurse Navigator  HEME/ONC OVERVIEW: 1. 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 Melene Plan, invasive SCCa, p16+; not a candidate for TORS -Late 10/2018 - 12/2018: definitive chemoradiation with weekly cisplatin   2. 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 -12/2018: acute DVT involving R peroneal vein and L gastrocnemius vein; new arterial thrombosis involving R common femoral artery and popliteal arteries, due to artery aneurysm -Late 12/2018: progression of acute DVT in the LLE from gastrocnemius vein to the femoral, popliteal, posterior tibial and peroneal veins despite being on Eliquis  -Currently on warfarin   3. Port placed in 10/2018; patient declined PEG   TREATMENT REGIMEN:  11/08/2018 - 12/29/2018: definitive chemoradiation with weekly cisplatin x 6  Anticoagulation:  Early 2000 - 12/2018: warfarin, non-compliant   12/2018: Eliquis 5mg  BID x ~4 weeks with clot progression  Late 12/2018 - present: warfarin (bridged with Lovenox)  PERTINENT NON-HEM/ONC PROBLEMS: 1. Tobacco and EtOH abuse 2. Paroxysmal A-fib 3. 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, p16+  -S/p  definitive chemoRT, completed on 12/29/2018 -We will plan to obtain PET in 3-4 months after completing chemoRT to assess disease response (tentatively early 04/2019) -PRN anti-emetics: Zofran, Compazine, and Ativan   Progressive LLE DVT -Currently on warfarin, managed by the anticoagulation clinic -INR 3.9 today -Patient already took warfarin today; no symptoms of bleeding or excess bruising  -I instructed the patient to hold his warfarin dose tomorrow, and to follow up with the Coumadin clinic for further management    Macrocytosis -MCV 103 with normal Hgb -Most likely due to recent chemotherapy -I have ordered B12 and folate levels for the next visit  -We will monitor it for now  Chemoradiation-associated mucositis -Secondary to chemoradiation -Overall improving; he still does salt/soda rinses and takes occasional ibuprofen -I cautioned the patient on taking NSAIDs due to the increased risk of bleeding while taking warfarin   No orders of the defined types were placed in this encounter.  All questions were answered. The patient knows to call the clinic with any problems, questions or concerns. No barriers to learning was detected.  Return in 6 weeks for labs, port flush, clinic appointment.  Tish Men, MD 02/15/2019 3:14 PM  CHIEF COMPLAINT: "I am doing better"  INTERVAL HISTORY: [name] [name] returns clinic for follow-up of squamous cell carcinoma of the left tonsil s/p definitive chemoradiation.  Patient reports that he still has fluctuating swelling in the left lower extremity, usually better in the morning and worse by the end of the day.  Overall, the swelling is improving, but he is not wearing the compression stockings.  He still has mild soreness in the back of the throat, for which he does regular salt/soda rinses and takes occasional ibuprofen.  His taste is still minimal, but he is able to eat and drink without any dysphasia or odynophagia.  His weight has been stable.  He  denies any other complaint today.  SUMMARY OF ONCOLOGIC HISTORY: Oncology History  Malignant neoplasm of tonsillar fossa ([place])  10/10/2018 Imaging   CT neck w/ contrast: FINDINGS: Pharynx and larynx: There is asymmetric prominence of the left tonsillar region. No other mucosal or submucosal lesion is seen.  Salivary glands: Parotid and submandibular glands are normal.  Thyroid: Normal  Lymph nodes: Definite adenopathy on the left. 2 level nodes which shows some internal necrosis. The larger measures 2.2 cm in diameter and the smaller measures 1.4 cm in diameter. Suspicious level 4 node image 83 just anterior to the jugular vein measuring 9 mm short axis diameter. No worrisome nodes on the right.   10/10/2018 Imaging   CT chest:  IMPRESSION: 1. Mild right hilar adenopathy, equivocal for metastasis. No additional potential evidence of metastatic disease in the chest. PET-CT could be considered for further staging evaluation, as clinically warranted. 2. 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 Ascending thoracic aortic aneurysm. Recommend semi-annual imaging followup by CTA or MRA and referral to cardiothoracic surgery if not already obtained. This recommendation follows 2010 ACCF/AHA/AATS/ACR/ASA/SCA/SCAI/SIR/STS/SVM Guidelines for the Diagnosis and Management of Patients With Thoracic Aortic Disease. Circulation. 2010; 121: W413-K440. Aortic aneurysm NOS (ICD10-I71.9).   10/17/2018 Initial Diagnosis   Tonsil cancer ([place])   10/18/2018 Imaging   PET: IMPRESSION: 1. 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 2. Local hypermetabolic nodal metastasis to the LEFT level II nodal station. 3. No contralateral nodal metastasis. 4. No evidence distant metastatic disease. 5. Two small pulmonary nodules in the LEFT lower lobe are favored benign. Recommend attention on follow-up. 6. Several small common iliac lymph nodes with low  metabolic activity. Favor reactive adenopathy.   10/21/2018 Cancer Staging   Staging form: Pharynx - HPV-Mediated Oropharynx, AJCC 8th Edition - Clinical: Stage I (cT2, cN1, cM0, p16+) - Signed by Eppie Gibson, MD on 10/21/2018   11/10/2018 -  Chemotherapy   The patient had palonosetron (ALOXI) injection 0.25 mg, 0.25 mg, Intravenous,  Once, 6 of 7 cycles Administration: 0.25 mg (11/10/2018), 0.25 mg (11/17/2018), 0.25 mg (12/22/2018), 0.25 mg (11/24/2018), 0.25 mg (12/15/2018), 0.25 mg (12/29/2018) CISplatin (PLATINOL) 100 mg in sodium chloride 0.9 % 500 mL chemo infusion, 40 mg/m2 = 100 mg, Intravenous,  Once, 6 of 7 cycles Dose modification: 30 mg/m2 (original dose 40 mg/m2, Cycle 5, Reason: Dose not tolerated) Administration: 100 mg (11/10/2018), 100 mg (11/17/2018), 75 mg (12/22/2018), 100 mg (11/24/2018), 75 mg (12/15/2018), 75 mg (12/29/2018) fosaprepitant (EMEND) 150 mg, dexamethasone (DECADRON) 12 mg in sodium chloride 0.9 % 145 mL IVPB, , Intravenous,  Once, 6 of 7 cycles Administration:  (11/10/2018),  (11/17/2018),  (12/22/2018),  (11/24/2018),  (12/15/2018),  (12/29/2018)  for chemotherapy treatment.      REVIEW OF SYSTEMS:   Constitutional: ( - ) fevers, ( - )  chills , ( - ) night sweats Eyes: ( - ) blurriness of vision, ( - ) double vision, ( - ) watery eyes Ears, nose, mouth, throat, and face: ( + ) mucositis, ( + ) sore throat Respiratory: ( - ) cough, ( - ) dyspnea, ( - ) wheezes Cardiovascular: ( - ) palpitation, ( - ) chest discomfort, ( + ) lower extremity swelling Gastrointestinal:  ( - ) nausea, ( - ) heartburn, ( - ) change in bowel habits Skin: ( - )  abnormal skin rashes Lymphatics: ( - ) new lymphadenopathy, ( - ) easy bruising Neurological: ( - ) numbness, ( - ) tingling, ( - ) new weaknesses Behavioral/Psych: ( - ) mood change, ( - ) new changes  All other systems were reviewed with the patient and are negative.  I have reviewed the past medical history, past surgical history, social  history and family history with the patient and they are unchanged from previous note.  ALLERGIES:  has No Known Allergies.  MEDICATIONS:  Current Outpatient Medications  Medication Sig Dispense Refill  . amLODipine (NORVASC) 10 MG tablet TAKE 1 TABLET BY MOUTH EVERY DAY (Patient taking differently: Take 10 mg by mouth daily. ) 90 tablet 2  . atorvastatin (LIPITOR) 20 MG tablet Take 1 tablet (20 mg total) by mouth daily. 30 tablet 11  . ibuprofen (ADVIL) 200 MG tablet Take 600 mg by mouth every 6 (six) hours as needed for pain or fever.     . sodium fluoride (PREVIDENT 5000 PLUS) 1.1 % CREA dental cream Apply to tooth brush. Brush teeth for 2 minutes. Spit out excess. DO NOT rinse afterwards. Repeat nightly. 1 Tube prn  . triamcinolone lotion (KENALOG) 0.1 % Apply to affected area twice a day 60 mL 0  . warfarin (COUMADIN) 10 MG tablet Take 10 mg by mouth daily.     No current facility-administered medications for this visit.    Facility-Administered Medications Ordered in Other Visits  Medication Dose Route Frequency Provider Last Rate Last Dose  . heparin lock flush 100 unit/mL  500 Units Intravenous Once Tish Men, MD      . sodium chloride flush (NS) 0.9 % injection 10 mL  10 mL Intravenous PRN Tish Men, MD        PHYSICAL EXAMINATION: ECOG PERFORMANCE STATUS: 1 - Symptomatic but completely ambulatory  Today's Vitals   02/15/19 1501 02/15/19 1506  BP: 106/81   Pulse: 93   Resp: 18   Temp: 98.3 F (36.8 C)   TempSrc: Oral   SpO2: 99%   Weight: 240 lb 8 oz (109.1 kg)   Height: 6\' 3"  (1.905 m)   PainSc:  0-No pain   Body mass index is 30.06 kg/m.  Filed Weights   02/15/19 1501  Weight: 240 lb 8 oz (109.1 kg)    GENERAL: alert, no distress and comfortable SKIN: skin color, texture, turgor are normal, no rashes or significant lesions EYES: conjunctiva are pink and non-injected, sclera clear OROPHARYNX: no exudate, no erythema; lips, buccal mucosa, and tongue normal   NECK: supple, non-tender LYMPH:  no palpable lymphadenopathy in the cervical LUNGS: clear to auscultation with normal breathing effort HEART: regular rate & rhythm and no murmurs and 1+ left lower extremity edema ABDOMEN: soft, non-tender, non-distended, normal bowel sounds Musculoskeletal: no cyanosis of digits and no clubbing  PSYCH: alert & oriented x 3, fluent speech NEURO: no focal motor/sensory deficits  LABORATORY DATA:  I have reviewed the data as listed    Component Value Date/Time   NA 140 02/15/2019 1415   NA 142 10/03/2018 1450   NA 141 05/21/2014 1226   K 4.7 02/15/2019 1415   K 4.3 05/21/2014 1226   CL 105 02/15/2019 1415   CL 108 (H) 12/28/2012 1405   CO2 26 02/15/2019 1415   CO2 28 05/21/2014 1226   GLUCOSE 86 02/15/2019 1415   GLUCOSE 101 05/21/2014 1226   GLUCOSE 95 12/28/2012 1405   BUN 14 02/15/2019 1415   BUN 15  10/03/2018 1450   BUN 13.2 05/21/2014 1226   CREATININE 0.87 02/15/2019 1415   CREATININE 1.00 09/24/2014 1413   CREATININE 0.9 05/21/2014 1226   CALCIUM 9.8 02/15/2019 1415   CALCIUM 9.3 05/21/2014 1226   PROT 6.8 02/15/2019 1415   PROT 7.2 10/03/2018 1450   PROT 7.1 01/08/2014 1203   ALBUMIN 3.5 02/15/2019 1415   ALBUMIN 4.3 10/03/2018 1450   ALBUMIN 3.7 01/08/2014 1203   AST 14 (L) 02/15/2019 1415   AST 50 (H) 01/08/2014 1203   ALT 18 02/15/2019 1415   ALT 83 (H) 01/08/2014 1203   ALKPHOS 87 02/15/2019 1415   ALKPHOS 87 01/08/2014 1203   BILITOT 0.4 02/15/2019 1415   BILITOT 0.67 01/08/2014 1203   GFRNONAA >60 02/15/2019 1415   GFRNONAA >60 10/15/2011 0224   GFRAA >60 02/15/2019 1415   GFRAA >60 10/15/2011 0224    No results found for: SPEP, UPEP  Lab Results  Component Value Date   WBC 5.8 02/15/2019   NEUTROABS 4.1 02/15/2019   HGB 13.8 02/15/2019   HCT 40.2 02/15/2019   MCV 103.3 (H) 02/15/2019   PLT 176 02/15/2019      Chemistry      Component Value Date/Time   NA 140 02/15/2019 1415   NA 142 10/03/2018 1450    NA 141 05/21/2014 1226   K 4.7 02/15/2019 1415   K 4.3 05/21/2014 1226   CL 105 02/15/2019 1415   CL 108 (H) 12/28/2012 1405   CO2 26 02/15/2019 1415   CO2 28 05/21/2014 1226   BUN 14 02/15/2019 1415   BUN 15 10/03/2018 1450   BUN 13.2 05/21/2014 1226   CREATININE 0.87 02/15/2019 1415   CREATININE 1.00 09/24/2014 1413   CREATININE 0.9 05/21/2014 1226      Component Value Date/Time   CALCIUM 9.8 02/15/2019 1415   CALCIUM 9.3 05/21/2014 1226   ALKPHOS 87 02/15/2019 1415   ALKPHOS 87 01/08/2014 1203   AST 14 (L) 02/15/2019 1415   AST 50 (H) 01/08/2014 1203   ALT 18 02/15/2019 1415   ALT 83 (H) 01/08/2014 1203   BILITOT 0.4 02/15/2019 1415   BILITOT 0.67 01/08/2014 1203

## 2019-02-16 ENCOUNTER — Telehealth: Payer: Self-pay | Admitting: Hematology

## 2019-02-16 NOTE — Telephone Encounter (Signed)
Called and left msg. Mailed printout  °

## 2019-02-17 ENCOUNTER — Telehealth: Payer: Self-pay | Admitting: Pharmacist

## 2019-02-17 NOTE — Telephone Encounter (Signed)
Received to my in-box in [place], result of INR collected at [facility] 2 days ago, INR was 3.9, on 10mg  warfarin PO each day. Have called patient and advised for TODAY-take ONLY 1/2 x 10mg  (5mg ) dose; 10mg  Sa/Su. INR Monday 20-Feb-2019 at 2:45PM.

## 2019-02-17 NOTE — Progress Notes (Signed)
Thanks Dr. Earnest Conroy. I will call him and adjust dose between now and his scheduled appointment on 20-Feb-2019 at 2:45PM.

## 2019-02-20 ENCOUNTER — Other Ambulatory Visit: Payer: Self-pay

## 2019-02-20 ENCOUNTER — Ambulatory Visit (INDEPENDENT_AMBULATORY_CARE_PROVIDER_SITE_OTHER): Payer: Medicaid Other | Admitting: Pharmacist

## 2019-02-20 ENCOUNTER — Encounter: Payer: Self-pay | Admitting: Internal Medicine

## 2019-02-20 ENCOUNTER — Ambulatory Visit (INDEPENDENT_AMBULATORY_CARE_PROVIDER_SITE_OTHER): Payer: Medicaid Other | Admitting: Internal Medicine

## 2019-02-20 VITALS — BP 128/86 | HR 59 | Temp 97.5°F | Wt 254.6 lb

## 2019-02-20 DIAGNOSIS — R791 Abnormal coagulation profile: Secondary | ICD-10-CM | POA: Diagnosis not present

## 2019-02-20 DIAGNOSIS — Z79899 Other long term (current) drug therapy: Secondary | ICD-10-CM | POA: Diagnosis not present

## 2019-02-20 DIAGNOSIS — I7121 Aneurysm of the ascending aorta, without rupture: Secondary | ICD-10-CM

## 2019-02-20 DIAGNOSIS — Z86718 Personal history of other venous thrombosis and embolism: Secondary | ICD-10-CM | POA: Diagnosis not present

## 2019-02-20 DIAGNOSIS — I82409 Acute embolism and thrombosis of unspecified deep veins of unspecified lower extremity: Secondary | ICD-10-CM

## 2019-02-20 DIAGNOSIS — I712 Thoracic aortic aneurysm, without rupture: Secondary | ICD-10-CM

## 2019-02-20 DIAGNOSIS — I10 Essential (primary) hypertension: Secondary | ICD-10-CM

## 2019-02-20 DIAGNOSIS — Z9221 Personal history of antineoplastic chemotherapy: Secondary | ICD-10-CM

## 2019-02-20 DIAGNOSIS — B370 Candidal stomatitis: Secondary | ICD-10-CM

## 2019-02-20 DIAGNOSIS — Z5181 Encounter for therapeutic drug level monitoring: Secondary | ICD-10-CM | POA: Diagnosis not present

## 2019-02-20 DIAGNOSIS — Z7901 Long term (current) use of anticoagulants: Secondary | ICD-10-CM

## 2019-02-20 DIAGNOSIS — E782 Mixed hyperlipidemia: Secondary | ICD-10-CM

## 2019-02-20 DIAGNOSIS — C090 Malignant neoplasm of tonsillar fossa: Secondary | ICD-10-CM

## 2019-02-20 LAB — POCT INR: INR: 3.7 — AB (ref 2.0–3.0)

## 2019-02-20 MED ORDER — NYSTATIN 100000 UNIT/ML MT SUSP: 5.0000 mL | Freq: Four times a day (QID) | OROMUCOSAL | 0 refills | Status: DC

## 2019-02-20 NOTE — Assessment & Plan Note (Signed)
The patient has a follow-up appointment with CT surgery on 8/26

## 2019-02-20 NOTE — Patient Instructions (Signed)
Thank you for visiting [place] in clinic today.  Below is a summary of what we discussed:  1.  [name] [name] - It appears you have a fungal infection in your mouth.  We have will prescribe nystatin mouthwash for you to take.  2.  INR levels - Please take your warfarin as Dr. gross has instructed you to.  We will check your INR levels at your next visit.  If you have any questions, comments or concerns please feel free to reach out to [place].

## 2019-02-20 NOTE — Assessment & Plan Note (Signed)
History of tonsillar fossa squamous cell carcinoma.  Completed chemotherapy regimen on 12/29/2018 with Dr. Maylon Peppers. Next f/u appointment 9/9

## 2019-02-20 NOTE — Progress Notes (Signed)
Anticoagulation Management [name] [name] is a 58 y.o. male who reports to the clinic for monitoring of warfarin treatment.    Indication: History of pulmonary embolism (resolved); Long term current anticoagulation.    Duration: indefinite Supervising physician: Aldine Contes  Anticoagulation Clinic Visit History: Patient does not report signs/symptoms of bleeding or thromboembolism  Other recent changes: No diet, medications, lifestyle changes other than as noted and observed in the current problem list.  Anticoagulation Episode Summary    Current INR goal:  2.5-3.5  TTR:  33.1 % (7.7 y)  Next INR check:  03/20/2019  INR from last check:  3.7 (02/20/2019)  Weekly max warfarin dose:    Target end date:    INR check location:    Preferred lab:    Send INR reminders to:  [facility]   Indications   Pulmonary embolism (HCC) (Resolved) [I26.99]       Comments:        Anticoagulation Care Providers    Provider Role Specialty Phone number   Annia Belt, MD Referring Oncology [phone]      No Known Allergies  Current Outpatient Medications:  .  amLODipine (NORVASC) 10 MG tablet, TAKE 1 TABLET BY MOUTH EVERY DAY (Patient taking differently: Take 10 mg by mouth daily. ), Disp: 90 tablet, Rfl: 2 .  atorvastatin (LIPITOR) 20 MG tablet, Take 1 tablet (20 mg total) by mouth daily., Disp: 30 tablet, Rfl: 11 .  ibuprofen (ADVIL) 200 MG tablet, Take 600 mg by mouth every 6 (six) hours as needed for pain or fever. , Disp: , Rfl:  .  sodium fluoride (PREVIDENT 5000 PLUS) 1.1 % CREA dental cream, Apply to tooth brush. Brush teeth for 2 minutes. Spit out excess. DO NOT rinse afterwards. Repeat nightly., Disp: 1 Tube, Rfl: prn .  triamcinolone lotion (KENALOG) 0.1 %, Apply to affected area twice a day, Disp: 60 mL, Rfl: 0 .  warfarin (COUMADIN) 10 MG tablet, Take 10 mg by mouth daily., Disp: , Rfl:  No current facility-administered medications for this visit.    Facility-Administered Medications Ordered in Other Visits:  .  heparin lock flush 100 unit/mL, 500 Units, Intravenous, Once, Tish Men, MD .  sodium chloride flush (NS) 0.9 % injection 10 mL, 10 mL, Intravenous, PRN, Tish Men, MD Past Medical History:  Diagnosis Date  . 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 Benign essential HTN 01/04/2012  . Chronic anticoagulation 01/02/2013  . Dermatofibroma of forearm 01/02/2013   Left side  . Hyperlipidemia, mixed 01/04/2012  . 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 Primary hypercoagulable state ([place]) 10/01/2014  . Sinus bradycardia, chronic 01/04/2012  . Superficial thrombosis of lower extremity 05/02/2012   Social History   Socioeconomic History  . Marital status: Divorced    Spouse name: Not on file  . Number of children: 2  . Years of education: Not on file  . Highest education level: Not on file  Occupational History  . Not on file  Social Needs  . Financial resource strain: Not on file  . Food insecurity    Worry: Not on file    Inability: Not on file  . Transportation needs    Medical: No    Non-medical: No  Tobacco Use  . Smoking status: Former Smoker    Packs/day: 0.50  Years: 30.00    Pack years: 15.00    Types: Cigarettes    Quit date: 10/19/2018    Years since quitting: 0.3  . Smokeless tobacco: Never Used  Substance and Sexual Activity  . Alcohol use: Yes    Alcohol/week: 13.0 - 14.0 standard drinks    Types: 12 Cans of beer, 1 - 2 Shots of liquor per week    Comment: 1-2 times per week.  . Drug use: No  . Sexual activity: Not on file  Lifestyle  . Physical activity    Days per week: Not on file    Minutes per session: Not on file  . Stress: Not on file  Relationships  . Social Herbalist on phone: Not on file    Gets together:  Not on file    Attends religious service: Not on file    Active member of club or organization: Not on file    Attends meetings of clubs or organizations: Not on file    Relationship status: Not on file  Other Topics Concern  . Not on file  Social History Narrative  . Not on file   Family History  Problem Relation Age of Onset  . Stroke Father     ASSESSMENT Recent Results: The most recent result is correlated with having had a dose omitted per Physician at [facility], decreased to 5mg  last Friday, 10mg  this past Saturday and Sunday.  Lab Results  Component Value Date   INR 3.7 (A) 02/20/2019   INR 3.9 (H) 02/15/2019   INR 2.1 (H) 02/06/2019   PROTIME 28.8 (H) 02/04/2015    Anticoagulation Dosing: Description   Take one (1) tablet of your 10mg  white-colored warfarin tablets on ALL DAYS of week--EXCEPT on TUESDAYS and FRIDAYS, take only  one-half (1/2 tablet on Tuesdays and Fridays.)       INR today: Supratherapeutic  PLAN Weekly dose was decreased to 60mg /wk warfarin.   Patient Instructions  Patient instructed to take medications as defined in the Anti-coagulation Track section of this encounter.  Patient instructed to take today's dose.  Patient instructed to take one (1) tablet of your 10mg  white-colored warfarin tablets on ALL DAYS of week--EXCEPT on TUESDAYS and FRIDAYS, take only  one-half (1/2 tablet on Tuesdays and Fridays.)   Patient verbalized understanding of these instructions.     Patient advised to contact clinic or seek medical attention if signs/symptoms of bleeding or thromboembolism occur.  Patient verbalized understanding by repeating back information and was advised to contact me if further medication-related questions arise. Patient was also provided an information handout.  Follow-up Return in 4 weeks (on 03/20/2019) for Follow up INR.  Pennie Banter, PharmD, CPP  15 minutes spent face-to-face with the patient during the encounter. 50% of time spent on  education, including signs/sx bleeding and clotting, as well as food and drug interactions with warfarin. 50% of time was spent on fingerprick POC INR sample collection,processing, results determination, and documentation in http://www.kim.net/.

## 2019-02-20 NOTE — Patient Instructions (Signed)
Patient instructed to take medications as defined in the Anti-coagulation Track section of this encounter.  Patient instructed to take today's dose.  Patient instructed to take one (1) tablet of your 10mg  white-colored warfarin tablets on ALL DAYS of week--EXCEPT on TUESDAYS and FRIDAYS, take only  one-half (1/2 tablet on Tuesdays and Fridays.)   Patient verbalized understanding of these instructions.

## 2019-02-20 NOTE — Progress Notes (Addendum)
CC: INR follow up   HPI:  [name] is a 58 y.o. 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 The patient had been on Warfarin 10 mg daily for recurrent DVTs, however was found to be supratheraputic at his last check on 7/29 (INR = 3.9). Dr. Elie Confer, our pharmacist, was able to talk to the patient and instructed him to take 5mg  until his check up today.   Mr. [name] [name] does endorse throat pain today.  He says he has had increased saliva and feels like there is something on his tongue.  He says that he has been treated for thrush about a month ago with his oncologist but the treatment did not improve much.   Of note, the patient has not had a colonoscopy done. He says that he is open to talking about it at his next visit but doesn't want to set this up right now.   Past Medical History:  Diagnosis Date  . 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 Benign essential HTN 01/04/2012  . Chronic anticoagulation 01/02/2013  . Dermatofibroma of forearm 01/02/2013   Left side  . Hyperlipidemia, mixed 01/04/2012  . 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 Primary hypercoagulable state ([place]) 10/01/2014  . Sinus bradycardia, chronic 01/04/2012  . Superficial thrombosis of lower extremity 05/02/2012   Review of Systems: All systems have been reviewed and are otherwise negative unless mentioned in the HPI.  Physical Exam:  Vitals:   02/20/19 1451  BP: 128/86  Pulse: (!) 59  Temp: (!) 97.5 F (36.4 C)  TempSrc: Oral  SpO2: 100%  Weight: 254 lb 9.6 oz (115.5 kg)    Physical Exam Vitals signs reviewed.  Constitutional:      General: He is not in  acute distress.    Appearance: Normal appearance. He is not ill-appearing, toxic-appearing or diaphoretic.  HENT:     Head: Normocephalic and atraumatic.     Mouth/Throat:     Mouth: Mucous membranes are moist.     Comments: Thrush present  Eyes:     General: No scleral icterus.       Right eye: No discharge.        Left eye: No discharge.     Extraocular Movements: Extraocular movements intact.     Conjunctiva/sclera: Conjunctivae normal.  Neck:     Musculoskeletal: Neck supple.  Cardiovascular:     Rate and Rhythm: Normal rate and regular rhythm.     Pulses: Normal pulses.     Heart sounds: Normal heart sounds. No murmur. No friction rub. No gallop.   Pulmonary:     Effort: No respiratory distress.     Breath sounds: Normal breath sounds. No wheezing or rales.  Abdominal:     General: Abdomen is flat. Bowel sounds are normal. There is no distension.     Palpations: Abdomen is soft.     Tenderness: There is no abdominal tenderness. There is no guarding.  Musculoskeletal: Normal range of motion.  Lymphadenopathy:     Cervical: No cervical adenopathy.  Skin:    General: Skin is warm.     Coloration: Skin is not jaundiced.     Findings:  No bruising.  Neurological:     General: No focal deficit present.     Mental Status: He is alert and oriented to person, place, and time.  Psychiatric:        Mood and Affect: Mood normal.        Behavior: Behavior normal.        Thought Content: Thought content normal.        Judgment: Judgment normal.    Assessment & Plan:   See Encounters Tab for problem based charting.  Patient seen with Dr. Dareen Piano

## 2019-02-20 NOTE — Addendum Note (Signed)
Addended by: [name] Fray on: 02/20/2019 03:46 PM   Modules accepted: Orders

## 2019-02-20 NOTE — Assessment & Plan Note (Signed)
Currently takes amlodipine 10 mg and has had adequate control.  We will continue this regimen and reassess needs at the next visit.

## 2019-02-20 NOTE — Assessment & Plan Note (Addendum)
Prescribed atorvastatin 20 mg on 7/21 at last visit.  Will need another lipid panel at next visit.

## 2019-02-20 NOTE — Assessment & Plan Note (Addendum)
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 Elie Confer instructed to take one 10mg  tablets on ALL DAYS of week--EXCEPT on TUESDAYS and FRIDAYS (take 1/2 tablet on Tuesdays and Fridays) - f/u in 4 weeks for another INR check.

## 2019-02-20 NOTE — Assessment & Plan Note (Signed)
Will prescribe oral nystatin today.

## 2019-02-21 ENCOUNTER — Encounter: Payer: Self-pay | Admitting: Hematology

## 2019-02-21 NOTE — Progress Notes (Signed)
INTERNAL MEDICINE TEACHING ATTENDING ADDENDUM - Aldine Contes M.D  Duration- indefinite, Indication- recurrent DVT/PE, arterial thrombosis, INR- supratherapeutic. Agree with pharmacy recommendations as outlined in their note.

## 2019-02-21 NOTE — Progress Notes (Signed)
Internal Medicine Clinic Attending ° °I saw and evaluated the patient.  I personally confirmed the key portions of the history and exam documented by Dr. Alexander and I reviewed pertinent patient test results.  The assessment, diagnosis, and plan were formulated together and I agree with the documentation in the resident’s note.  °

## 2019-02-21 NOTE — Addendum Note (Signed)
Addended by: Aldine Contes on: 02/21/2019 02:11 PM   Modules accepted: Level of Service

## 2019-03-14 ENCOUNTER — Telehealth: Payer: Self-pay | Admitting: Radiation Oncology

## 2019-03-14 ENCOUNTER — Other Ambulatory Visit: Payer: Self-pay | Admitting: Internal Medicine

## 2019-03-14 DIAGNOSIS — D6859 Other primary thrombophilia: Secondary | ICD-10-CM

## 2019-03-14 DIAGNOSIS — I2782 Chronic pulmonary embolism: Secondary | ICD-10-CM

## 2019-03-14 DIAGNOSIS — I825Y1 Chronic embolism and thrombosis of unspecified deep veins of right proximal lower extremity: Secondary | ICD-10-CM

## 2019-03-14 DIAGNOSIS — E782 Mixed hyperlipidemia: Secondary | ICD-10-CM

## 2019-03-14 DIAGNOSIS — Z7901 Long term (current) use of anticoagulants: Secondary | ICD-10-CM

## 2019-03-14 DIAGNOSIS — D751 Secondary polycythemia: Secondary | ICD-10-CM

## 2019-03-14 NOTE — Telephone Encounter (Signed)
Left voicemail to confirm appt

## 2019-03-14 NOTE — Telephone Encounter (Signed)
Refill Request  amLODipine (NORVASC) 10 MG tablet warfarin (COUMADIN) 10 MG tablet  [id number] [name] [name], [place]

## 2019-03-15 ENCOUNTER — Other Ambulatory Visit: Payer: Self-pay | Admitting: Internal Medicine

## 2019-03-15 ENCOUNTER — Other Ambulatory Visit: Payer: Self-pay

## 2019-03-15 ENCOUNTER — Institutional Professional Consult (permissible substitution): Payer: Medicaid Other | Admitting: Surgery

## 2019-03-15 ENCOUNTER — Encounter: Payer: Self-pay | Admitting: Surgery

## 2019-03-15 ENCOUNTER — Encounter: Payer: Self-pay | Admitting: *Deleted

## 2019-03-15 ENCOUNTER — Telehealth: Payer: Self-pay | Admitting: *Deleted

## 2019-03-15 ENCOUNTER — Ambulatory Visit
Admission: RE | Admit: 2019-03-15 | Discharge: 2019-03-15 | Disposition: A | Discharge status: Home / Self Care | Payer: Medicaid Other | Source: Ambulatory Visit | Attending: Radiation Oncology | Admitting: Radiation Oncology

## 2019-03-15 ENCOUNTER — Encounter: Payer: Self-pay | Admitting: Radiation Oncology

## 2019-03-15 VITALS — BP 117/83 | HR 88 | Temp 97.9°F | Resp 16 | Ht 75.0 in | Wt 240.2 lb

## 2019-03-15 DIAGNOSIS — D751 Secondary polycythemia: Secondary | ICD-10-CM

## 2019-03-15 DIAGNOSIS — C090 Malignant neoplasm of tonsillar fossa: Secondary | ICD-10-CM

## 2019-03-15 DIAGNOSIS — I825Y1 Chronic embolism and thrombosis of unspecified deep veins of right proximal lower extremity: Secondary | ICD-10-CM

## 2019-03-15 DIAGNOSIS — Z7901 Long term (current) use of anticoagulants: Secondary | ICD-10-CM

## 2019-03-15 DIAGNOSIS — I82409 Acute embolism and thrombosis of unspecified deep veins of unspecified lower extremity: Secondary | ICD-10-CM

## 2019-03-15 DIAGNOSIS — E782 Mixed hyperlipidemia: Secondary | ICD-10-CM

## 2019-03-15 DIAGNOSIS — I712 Thoracic aortic aneurysm, without rupture: Secondary | ICD-10-CM | POA: Diagnosis not present

## 2019-03-15 DIAGNOSIS — I2782 Chronic pulmonary embolism: Secondary | ICD-10-CM

## 2019-03-15 DIAGNOSIS — I7121 Aneurysm of the ascending aorta, without rupture: Secondary | ICD-10-CM

## 2019-03-15 DIAGNOSIS — D6859 Other primary thrombophilia: Secondary | ICD-10-CM

## 2019-03-15 MED ORDER — AMLODIPINE BESYLATE 10 MG PO TABS: 10.0000 mg | ORAL_TABLET | Freq: Every day | ORAL | 2 refills | Status: DC

## 2019-03-15 NOTE — Progress Notes (Signed)
Radiation Oncology         [phone] ________________________________  Name: [name] [name] MRN: [id number]  Date: 03/15/2019  DOB: 1961/05/24  Follow-Up Visit Note by WebEx due to pandemic precautions  CC: [name], MD  Annia Belt, MD  Diagnosis and Prior Radiotherapy:       ICD-10-CM   1. Malignant neoplasm of tonsillar fossa ([place])  C09.0 Ambulatory referral to Physical Therapy    NM PET Image Restag (PS) Skull Base To Thigh    Ambulatory Referral to Neuro Rehab    11/08/2018 - 12/28/2018: Left Tonsil / 70 Gy in 35 fractions  CHIEF COMPLAINT:  Here for follow-up and surveillance of tonsillar cancer  Narrative:  The patient returns today for routine follow-up. He last saw Dr. Maylon Peppers on 02/15/2019.  On review of systems, he notes left sided throat pain and poor taste. Skin has tanning. Uses lotion occasionally. Never got a feeding tube.  Eating relatively well. Notes some generalized soft swelling in front neck.  ALLERGIES:  has No Known Allergies.  Meds: Current Outpatient Medications  Medication Sig Dispense Refill  . amLODipine (NORVASC) 10 MG tablet Take 1 tablet (10 mg total) by mouth daily. 90 tablet 2  . atorvastatin (LIPITOR) 20 MG tablet Take 1 tablet (20 mg total) by mouth daily. 30 tablet 11  . ibuprofen (ADVIL) 200 MG tablet Take 600 mg by mouth every 6 (six) hours as needed for pain or fever.     . nystatin (MYCOSTATIN) 100000 UNIT/ML suspension Take 5 mLs (500,000 Units total) by mouth 4 (four) times daily. 60 mL 0  . sodium fluoride (PREVIDENT 5000 PLUS) 1.1 % CREA dental cream Apply to tooth brush. Brush teeth for 2 minutes. Spit out excess. DO NOT rinse afterwards. Repeat nightly. 1 Tube prn  . triamcinolone lotion (KENALOG) 0.1 % Apply to affected area twice a day 60 mL 0  . warfarin (COUMADIN) 10 MG tablet Take 10 mg by mouth daily.     No current facility-administered medications for this encounter.    Facility-Administered Medications  Ordered in Other Encounters  Medication Dose Route Frequency Provider Last Rate Last Dose  . heparin lock flush 100 unit/mL  500 Units Intravenous Once Tish Men, MD      . sodium chloride flush (NS) 0.9 % injection 10 mL  10 mL Intravenous PRN Tish Men, MD        Physical Findings: The patient is in no acute distress. Patient is alert and oriented. Wt Readings from Last 3 Encounters:  02/20/19 254 lb 9.6 oz (115.5 kg)  02/15/19 240 lb 8 oz (109.1 kg)  02/06/19 238 lb 12.8 oz (108.3 kg)    vitals were not taken for this visit. .  General: Alert and oriented, in no acute distress Neck - lymphedema Psychiatric: Judgment and insight are intact. Affect is appropriate.}   Lab Findings: Lab Results  Component Value Date   WBC 5.8 02/15/2019   HGB 13.8 02/15/2019   HCT 40.2 02/15/2019   MCV 103.3 (H) 02/15/2019   PLT 176 02/15/2019    Lab Results  Component Value Date   TSH 1.404 01/18/2019    Radiographic Findings: No results found.  Impression/Plan:    1) Head and Neck Cancer Status: healing from treatments  2) Nutritional Status: stable per history PEG tube: none  3) Risk Factors: The patient has been educated about risk factors including alcohol and tobacco abuse; they understand that avoidance of alcohol and tobacco is important  to prevent recurrences as well as other cancers  4) Swallowing: good function, refer to SLP for prevention of long term dysphagia  5) Dental: Encouraged to continue regular followup with dentistry, and dental hygiene including fluoride rinses.   6) Thyroid function:  Lab Results  Component Value Date   TSH 1.404 01/18/2019    7) Other: I am ordering a PET scan at the end of September and he will follow-up with me after that study.  Also referring him to physical therapy for lymphedema of the neck.  This encounter was provided by telemedicine platform Webex.  The patient has given verbal consent for this type of encounter and has been  advised to only accept a meeting of this type in a secure network environment. The time spent during this encounter was 15 minutes. The attendants for this meeting include Eppie Gibson  and [name]. Also Gayleen Orem, RN, our Head and Neck Oncology Navigator present. During the encounter, Eppie Gibson was located at [facility] Radiation Oncology Department.  JERAN LEPAGE was located at home.  _____________________________________   Eppie Gibson, MD  This document serves as a record of services personally performed by Eppie Gibson, MD. It was created on her behalf by Wilburn Mylar, a trained medical scribe. The creation of this record is based on the scribe's personal observations and the provider's statements to them. This document has been checked and approved by the attending provider.

## 2019-03-15 NOTE — Telephone Encounter (Signed)
CALLED PATIENT TO INFORM OF PT APPT. ON 03-21-19 @ 2:30 PM @ [facility], LVM FOR A RETURN CALL

## 2019-03-16 ENCOUNTER — Encounter: Payer: Self-pay | Admitting: Surgery

## 2019-03-16 MED ORDER — WARFARIN SODIUM 10 MG PO TABS: ORAL_TABLET | ORAL | 0 refills | Status: DC

## 2019-03-16 NOTE — Progress Notes (Signed)
Cardiothoracic Surgery Consultation  PCP is [name], MD Referring Provider is Axel Filler,*  Chief Complaint  Patient presents with  . 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  He also has an abdominal aortic aneurysm measuring 3 cm.  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  He had a CT scan of the chest in March 2020 and work-up of his tonsillar cancer which showed a 4.7 cm fusiform ascending aortic aneurysm.  Review of his medical record shows that he has had prior CT scans of the chest in 2006, 2007, and 2013 for evaluation of pulmonary embolism and pulmonary nodules although there is no mention of the ascending aorta on those reports and we do not have access to those images.  There is no family history of aortic aneurysm, aortic dissection, or connective tissue disorder.  Past Medical History:  Diagnosis Date  . 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 Benign essential HTN 01/04/2012  . Chronic anticoagulation 01/02/2013  . Dermatofibroma of forearm 01/02/2013   Left side  . Hyperlipidemia, mixed 01/04/2012  . 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 Primary hypercoagulable state ([place])  10/01/2014  . Sinus bradycardia, chronic 01/04/2012  . Superficial thrombosis of lower extremity 05/02/2012    Past Surgical History:  Procedure Laterality Date  . DIRECT LARYNGOSCOPY Left 10/19/2018   Procedure: DIRECT LARYNGOSCOPY;  Surgeon: Leta Baptist, MD;  Location: [facility];  Service: ENT;  Laterality: Left;  . IR IMAGING GUIDED PORT INSERTION  11/04/2018  . TONSILLECTOMY Left 10/19/2018   Procedure: BIOPSY OF LEFT TONSIL;  Surgeon: Leta Baptist, MD;  Location: [facility];  Service: ENT;  Laterality: Left;    Family History  Problem Relation Age of Onset  . Stroke Father     Social History Social History   Tobacco Use  . Smoking status: Former Smoker    Packs/day: 0.50    Years: 30.00    Pack years: 15.00    Types: Cigarettes    Quit date: 10/19/2018    Years since quitting: 0.4  . Smokeless tobacco: Never Used  Substance Use Topics  . Alcohol use: Yes    Alcohol/week: 13.0 - 14.0 standard drinks    Types: 12 Cans of beer, 1 - 2 Shots of liquor per week    Comment: 1-2 times per week.  . Drug use: No    Current Outpatient Medications  Medication Sig Dispense Refill  . amLODipine (NORVASC) 10 MG tablet Take 1 tablet (10 mg total) by mouth daily. 90 tablet 2  . atorvastatin (LIPITOR) 20 MG tablet Take 1 tablet (20 mg total) by mouth daily. 30 tablet 11  . ibuprofen (ADVIL) 200 MG tablet Take 600 mg by mouth  every 6 (six) hours as needed for pain or fever.     . nystatin (MYCOSTATIN) 100000 UNIT/ML suspension Take 5 mLs (500,000 Units total) by mouth 4 (four) times daily. 60 mL 0  . sodium fluoride (PREVIDENT 5000 PLUS) 1.1 % CREA dental cream Apply to tooth brush. Brush teeth for 2 minutes. Spit out excess. DO NOT rinse afterwards. Repeat nightly. 1 Tube prn  . triamcinolone lotion (KENALOG) 0.1 % Apply to affected area twice a day 60 mL 0  . warfarin (COUMADIN) 10 MG tablet Take 10 mg by mouth daily.     No current facility-administered  medications for this visit.    Facility-Administered Medications Ordered in Other Visits  Medication Dose Route Frequency Provider Last Rate Last Dose  . heparin lock flush 100 unit/mL  500 Units Intravenous Once Tish Men, MD      . sodium chloride flush (NS) 0.9 % injection 10 mL  10 mL Intravenous PRN Tish Men, MD        No Known Allergies  Review of Systems  Constitutional: Positive for appetite change and fatigue.  HENT:       Sore throat  Eyes: Negative.   Respiratory: Positive for shortness of breath.   Cardiovascular: Positive for leg swelling. Negative for chest pain.       History of lower extremity DVT and PE  Gastrointestinal: Negative.   Endocrine: Negative.   Genitourinary: Negative.   Musculoskeletal: Negative.   Skin: Negative.   Neurological: Positive for dizziness.  Hematological:       Hypercoagulability followed by Dr. Beryle Beams  Psychiatric/Behavioral: Negative.     BP 117/83 (BP Location: Right Arm, Patient Position: Sitting, Cuff Size: Large)   Pulse 88   Temp 97.9 F (36.6 C)   Resp 16   Ht 6\' 3"  (1.905 m)   Wt 240 lb 3.2 oz (109 kg)   SpO2 97% Comment: RA  BMI 30.02 kg/m  Physical Exam Constitutional:      Appearance: Normal appearance.  HENT:     Head: Normocephalic and atraumatic.  Eyes:     Extraocular Movements: Extraocular movements intact.     Conjunctiva/sclera: Conjunctivae normal.     Pupils: Pupils are equal, round, and reactive to light.  Neck:     Musculoskeletal: Normal range of motion.     Vascular: No carotid bruit.     Comments: Thickening of skin of the left neck with some tanning from radiation therapy Cardiovascular:     Rate and Rhythm: Normal rate and regular rhythm.     Heart sounds: Normal heart sounds. No murmur.  Pulmonary:     Effort: Pulmonary effort is normal.     Breath sounds: Normal breath sounds.  Musculoskeletal: Normal range of motion.        General: No swelling.  Skin:    General: Skin is warm  and dry.  Neurological:     General: No focal deficit present.     Mental Status: He is alert and oriented to person, place, and time.  Psychiatric:        Mood and Affect: Mood normal.        Behavior: Behavior normal.      Diagnostic Tests:  CLINICAL DATA:  Head neck cancer. Left neck adenopathy. Current smoker.  EXAM: CT CHEST WITH CONTRAST  TECHNIQUE: Multidetector CT imaging of the chest was performed during intravenous contrast administration.  CONTRAST:  126mL OMNIPAQUE IOHEXOL 300 MG/ML  SOLN  COMPARISON:  10/15/2011 chest CT  angiogram.  FINDINGS: Cardiovascular: Normal heart size. No significant pericardial effusion/thickening. 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 Top-normal caliber main pulmonary artery (3.4 cm diameter). No central pulmonary emboli.  Mediastinum/Nodes: No discrete thyroid nodules. Unremarkable esophagus. No pathologically enlarged axillary, mediastinal or hilar lymph nodes.  Lungs/Pleura: No pneumothorax. No pleural effusion. Mild centrilobular and paraseptal emphysema with mild diffuse bronchial wall thickening. A few scattered solid pulmonary nodules in both lungs, largest 6 mm in the left lower lobe (series 4/image 104), all stable since 10/15/2011 chest CT, considered benign. No acute consolidative airspace disease, lung masses or new significant pulmonary nodules. Parenchymal banding at lung bases bilaterally, compatible with postinfectious/postinflammatory scarring.  Upper abdomen: No acute abnormality.  Musculoskeletal: No aggressive appearing focal osseous lesions. Mild thoracic spondylosis.  IMPRESSION: 1. Mild right hilar adenopathy, equivocal for metastasis. No additional potential evidence of metastatic disease in the chest. PET-CT could be considered for further staging evaluation, as clinically warranted. 2. 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 Ascending thoracic aortic aneurysm. Recommend  semi-annual imaging followup by CTA or MRA and referral to cardiothoracic surgery if not already obtained. This recommendation follows 2010 ACCF/AHA/AATS/ACR/ASA/SCA/SCAI/SIR/STS/SVM Guidelines for the Diagnosis and Management of Patients With Thoracic Aortic Disease. Circulation. 2010; [id number]. Aortic aneurysm NOS (ICD10-I71.9).  Emphysema (ICD10-J43.9).   Electronically Signed   By: Ilona Sorrel M.D.   On: 10/11/2018 08:15  Impression:  This 58 year old gentleman has a 4.7 cm fusiform ascending aortic aneurysm of unknown duration.  He has had prior CT scans of the chest dating back to 2006 but we do not have access to those images and there was no mention of the aorta on the reports.  This is still well below the surgical threshold of 5.5 cm.  I reviewed the images with him and answered his questions.  I stressed the importance of good blood pressure control and preventing further enlargement and acute aortic dissection.  He does have some aneurysmal disease involving the abdominal aorta, iliac arteries, and right popliteal artery.  I have recommended that he have a follow-up CT scan of the chest in 1 year.  I have advised him against doing any heavy lifting of more than 35 pounds that may result in a Valsalva maneuver and a sudden marked rise in his blood pressure.   Plan:  He will return to see me in 1 year with a CTA of the chest.  I spent 30 minutes performing this consultation and > 50% of this time was spent face to face counseling and coordinating the care of this patient's ascending aortic aneurysm.   Gaye Pollack, MD [facility] [phone]

## 2019-03-16 NOTE — Telephone Encounter (Signed)
Pt would like the   warfarin (COUMADIN) 10 MG tablet  To be sent to   [id number] - [place], [place] [phone] (Phone) [phone] (Fax)

## 2019-03-20 ENCOUNTER — Other Ambulatory Visit: Payer: Self-pay

## 2019-03-20 ENCOUNTER — Ambulatory Visit (INDEPENDENT_AMBULATORY_CARE_PROVIDER_SITE_OTHER): Payer: Medicaid Other | Admitting: Pharmacist

## 2019-03-20 DIAGNOSIS — I82409 Acute embolism and thrombosis of unspecified deep veins of unspecified lower extremity: Secondary | ICD-10-CM

## 2019-03-20 DIAGNOSIS — Z5181 Encounter for therapeutic drug level monitoring: Secondary | ICD-10-CM | POA: Diagnosis not present

## 2019-03-20 DIAGNOSIS — Z7901 Long term (current) use of anticoagulants: Secondary | ICD-10-CM

## 2019-03-20 DIAGNOSIS — Z86711 Personal history of pulmonary embolism: Secondary | ICD-10-CM | POA: Diagnosis not present

## 2019-03-20 DIAGNOSIS — Z86718 Personal history of other venous thrombosis and embolism: Secondary | ICD-10-CM

## 2019-03-20 DIAGNOSIS — I825Y1 Chronic embolism and thrombosis of unspecified deep veins of right proximal lower extremity: Secondary | ICD-10-CM

## 2019-03-20 LAB — POCT INR: INR: 2.9 (ref 2.0–3.0)

## 2019-03-20 NOTE — Patient Instructions (Signed)
Patient instructed to take medications as defined in the Anti-coagulation Track section of this encounter.  Patient instructed to take today's dose.  Patient instructed to take one (1) tablet of your 10mg  white-colored warfarin tablets on ALL DAYS of week--EXCEPT on MONDAYS, WEDNESDAYS and  FRIDAYS, take only  one-half (1/2 tablet)  on Mondays, Wednesdays and Fridays.  Patient verbalized understanding of these instructions.

## 2019-03-20 NOTE — Progress Notes (Signed)
Anticoagulation Management [name] [name] is a 58 y.o. male who reports to the clinic for monitoring of warfarin treatment.    Indication: PE, History of (resolved); DVT (History of, resolved); Long term current use of anticoagulant.   Duration: indefinite Supervising physician: [facility] Visit History: Patient does not report signs/symptoms of bleeding or thromboembolism  Other recent changes: No diet, medications, lifestyle changes.  Anticoagulation Episode Summary    Current INR goal:  2.5-3.5  TTR:  33.5 % (7.8 y)  Next INR check:  04/17/2019  INR from last check:  2.9 (03/20/2019)  Weekly max warfarin dose:    Target end date:    INR check location:    Preferred lab:    Send INR reminders to:  [facility]   Indications   Pulmonary embolism ([place]) (Resolved) [I26.99]       Comments:        Anticoagulation Care Providers    Provider Role Specialty Phone number   Annia Belt, MD Referring Oncology [phone]      No Known Allergies  Current Outpatient Medications:  .  amLODipine (NORVASC) 10 MG tablet, Take 1 tablet (10 mg total) by mouth daily., Disp: 90 tablet, Rfl: 2 .  atorvastatin (LIPITOR) 20 MG tablet, Take 1 tablet (20 mg total) by mouth daily., Disp: 30 tablet, Rfl: 11 .  ibuprofen (ADVIL) 200 MG tablet, Take 600 mg by mouth every 6 (six) hours as needed for pain or fever. , Disp: , Rfl:  .  nystatin (MYCOSTATIN) 100000 UNIT/ML suspension, Take 5 mLs (500,000 Units total) by mouth 4 (four) times daily., Disp: 60 mL, Rfl: 0 .  sodium fluoride (PREVIDENT 5000 PLUS) 1.1 % CREA dental cream, Apply to tooth brush. Brush teeth for 2 minutes. Spit out excess. DO NOT rinse afterwards. Repeat nightly., Disp: 1 Tube, Rfl: prn .  triamcinolone lotion (KENALOG) 0.1 %, Apply to affected area twice a day, Disp: 60 mL, Rfl: 0 .  warfarin (COUMADIN) 10 MG tablet, Take 1 tablet of 10 mg warfarin daily except on Tuesday and Friday take 1/2  tablet (5 mg), Disp: 30 tablet, Rfl: 0 No current facility-administered medications for this visit.   Facility-Administered Medications Ordered in Other Visits:  .  heparin lock flush 100 unit/mL, 500 Units, Intravenous, Once, Tish Men, MD .  sodium chloride flush (NS) 0.9 % injection 10 mL, 10 mL, Intravenous, PRN, Tish Men, MD Past Medical History:  Diagnosis Date  . 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 Benign essential HTN 01/04/2012  . Chronic anticoagulation 01/02/2013  . Dermatofibroma of forearm 01/02/2013   Left side  . Hyperlipidemia, mixed 01/04/2012  . 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 Primary hypercoagulable state ([place]) 10/01/2014  . Sinus bradycardia, chronic 01/04/2012  . Superficial thrombosis of lower extremity 05/02/2012   Social History   Socioeconomic History  . Marital status: Divorced    Spouse name: Not on file  . Number of children: 2  . Years of education: Not on file  . Highest education level: Not on file  Occupational History  . Not on file  Social Needs  . Financial resource strain: Not on file  . Food insecurity    Worry: Not on file    Inability: Not on file  . Transportation needs  Medical: No    Non-medical: No  Tobacco Use  . Smoking status: Former Smoker    Packs/day: 0.50    Years: 30.00    Pack years: 15.00    Types: Cigarettes    Quit date: 10/19/2018    Years since quitting: 0.4  . Smokeless tobacco: Never Used  Substance and Sexual Activity  . Alcohol use: Yes    Alcohol/week: 13.0 - 14.0 standard drinks    Types: 12 Cans of beer, 1 - 2 Shots of liquor per week    Comment: 1-2 times per week.  . Drug use: No  . Sexual activity: Not on file  Lifestyle  . Physical activity    Days per week: Not on file    Minutes per session: Not on file  . Stress:  Not on file  Relationships  . Social Herbalist on phone: Not on file    Gets together: Not on file    Attends religious service: Not on file    Active member of club or organization: Not on file    Attends meetings of clubs or organizations: Not on file    Relationship status: Not on file  Other Topics Concern  . Not on file  Social History Narrative  . Not on file   Family History  Problem Relation Age of Onset  . Stroke Father     ASSESSMENT Recent Results: The most recent result is correlated with 60 mg per week: Lab Results  Component Value Date   INR 2.9 03/20/2019   INR 3.7 (A) 02/20/2019   INR 3.9 (H) 02/15/2019   PROTIME 28.8 (H) 02/04/2015    Anticoagulation Dosing: Description   Take one (1) tablet of your 10mg  white-colored warfarin tablets on ALL DAYS of week--EXCEPT on MONDAYS, WEDNESDAYS and  FRIDAYS, take only  one-half (1/2 tablet)  on Mondays, Wednesdays and Fridays.      INR today: Therapeutic  PLAN Weekly dose was decreased by 8% to 55 mg per week  Patient Instructions  Patient instructed to take medications as defined in the Anti-coagulation Track section of this encounter.  Patient instructed to take today's dose.  Patient instructed to take one (1) tablet of your 10mg  white-colored warfarin tablets on ALL DAYS of week--EXCEPT on MONDAYS, WEDNESDAYS and  FRIDAYS, take only  one-half (1/2 tablet)  on Mondays, Wednesdays and Fridays.  Patient verbalized understanding of these instructions.     Patient advised to contact clinic or seek medical attention if signs/symptoms of bleeding or thromboembolism occur.  Patient verbalized understanding by repeating back information and was advised to contact me if further medication-related questions arise. Patient was also provided an information handout.  Follow-up Return in 4 weeks (on 04/17/2019) for Follow up INR.  Pennie Banter, PharmD, CPP  15 minutes spent face-to-face with the patient  during the encounter. 50% of time spent on education, including signs/sx bleeding and clotting, as well as food and drug interactions with warfarin. 50% of time was spent on fingerprick POC INR sample collection,processing, results determination, and documentation in [email or web address].kim.[email or web address]/.

## 2019-03-21 ENCOUNTER — Ambulatory Visit: Payer: Medicaid Other | Attending: Radiation Oncology | Admitting: Physical Therapy

## 2019-03-21 DIAGNOSIS — R1310 Dysphagia, unspecified: Secondary | ICD-10-CM | POA: Insufficient documentation

## 2019-03-21 DIAGNOSIS — I890 Lymphedema, not elsewhere classified: Secondary | ICD-10-CM | POA: Insufficient documentation

## 2019-03-21 DIAGNOSIS — R293 Abnormal posture: Secondary | ICD-10-CM | POA: Insufficient documentation

## 2019-03-22 NOTE — Progress Notes (Signed)
I reviewed Dr. [name] note.  Patient was at goal, however, dose was modestly decreased.

## 2019-03-29 ENCOUNTER — Inpatient Hospital Stay: Payer: Medicaid Other

## 2019-03-29 ENCOUNTER — Telehealth: Payer: Self-pay | Admitting: Hematology

## 2019-03-29 ENCOUNTER — Inpatient Hospital Stay: Payer: Medicaid Other | Attending: Hematology | Admitting: Hematology

## 2019-03-29 ENCOUNTER — Encounter: Payer: Self-pay | Admitting: Hematology

## 2019-03-29 ENCOUNTER — Other Ambulatory Visit: Payer: Self-pay

## 2019-03-29 VITALS — BP 114/88 | HR 96 | Temp 98.2°F | Resp 18 | Ht 75.0 in | Wt 237.4 lb

## 2019-03-29 DIAGNOSIS — I82811 Embolism and thrombosis of superficial veins of right lower extremities: Secondary | ICD-10-CM

## 2019-03-29 DIAGNOSIS — C090 Malignant neoplasm of tonsillar fossa: Secondary | ICD-10-CM

## 2019-03-29 DIAGNOSIS — K1231 Oral mucositis (ulcerative) due to antineoplastic therapy: Secondary | ICD-10-CM | POA: Diagnosis not present

## 2019-03-29 DIAGNOSIS — R859 Unspecified abnormal finding in specimens from digestive organs and abdominal cavity: Secondary | ICD-10-CM

## 2019-03-29 DIAGNOSIS — Z7901 Long term (current) use of anticoagulants: Secondary | ICD-10-CM | POA: Diagnosis not present

## 2019-03-29 DIAGNOSIS — I825Y1 Chronic embolism and thrombosis of unspecified deep veins of right proximal lower extremity: Secondary | ICD-10-CM | POA: Insufficient documentation

## 2019-03-29 DIAGNOSIS — Z95828 Presence of other vascular implants and grafts: Secondary | ICD-10-CM

## 2019-03-29 LAB — CMP (CANCER CENTER ONLY)
ALT: 15 U/L (ref 0–44)
AST: 14 U/L — ABNORMAL LOW (ref 15–41)
Albumin: 4.2 g/dL (ref 3.5–5.0)
Alkaline Phosphatase: 86 U/L (ref 38–126)
Anion gap: 11 (ref 5–15)
BUN: 12 mg/dL (ref 6–20)
CO2: 25 mmol/L (ref 22–32)
Calcium: 9.9 mg/dL (ref 8.9–10.3)
Chloride: 103 mmol/L (ref 98–111)
Creatinine: 0.91 mg/dL (ref 0.61–1.24)
GFR, Est AFR Am: >60 mL/min (ref >60)
GFR, Estimated: >60 mL/min (ref >60)
Glucose, Bld: 96 mg/dL (ref 70–99)
Potassium: 4.1 mmol/L (ref 3.5–5.1)
Sodium: 139 mmol/L (ref 135–145)
Total Bilirubin: 0.4 mg/dL (ref 0.3–1.2)
Total Protein: 7 g/dL (ref 6.5–8.1)

## 2019-03-29 LAB — CBC WITH DIFFERENTIAL (CANCER CENTER ONLY)
Abs Immature Granulocytes: 0.01 10*3/uL (ref 0.00–0.07)
Basophils Absolute: 0 10*3/uL (ref 0.0–0.1)
Basophils Relative: 0 %
Eosinophils Absolute: 0.1 10*3/uL (ref 0.0–0.5)
Eosinophils Relative: 2 %
HCT: 47.3 % (ref 39.0–52.0)
Hemoglobin: 16.3 g/dL (ref 13.0–17.0)
Immature Granulocytes: 0 %
Lymphocytes Relative: 12 %
Lymphs Abs: 0.7 10*3/uL (ref 0.7–4.0)
MCH: 34 pg (ref 26.0–34.0)
MCHC: 34.5 g/dL (ref 30.0–36.0)
MCV: 98.5 fL (ref 80.0–100.0)
Monocytes Absolute: 0.7 10*3/uL (ref 0.1–1.0)
Monocytes Relative: 13 %
Neutro Abs: 4.3 10*3/uL (ref 1.7–7.7)
Neutrophils Relative %: 73 %
Platelet Count: 157 10*3/uL (ref 150–400)
RBC: 4.8 MIL/uL (ref 4.22–5.81)
RDW: 12.9 % (ref 11.5–15.5)
WBC Count: 5.9 10*3/uL (ref 4.0–10.5)
nRBC: 0 % (ref 0.0–0.2)

## 2019-03-29 LAB — MAGNESIUM: Magnesium: 1.7 mg/dL (ref 1.7–2.4)

## 2019-03-29 MED ORDER — SODIUM CHLORIDE 0.9% FLUSH
10.0000 mL | INTRAVENOUS | Status: DC | PRN
  Administered 2019-03-29: 15:00:00 10 mL
  Filled 2019-03-29: qty 10

## 2019-03-29 MED ORDER — HEPARIN SOD (PORK) LOCK FLUSH 100 UNIT/ML IV SOLN
500.0000 [IU] | Freq: Once | INTRAVENOUS | Status: AC | PRN
  Administered 2019-03-29: 500 [IU]
  Filled 2019-03-29: qty 5

## 2019-03-29 MED ORDER — GUAIFENESIN ER 600 MG PO TB12: 600.0000 mg | ORAL_TABLET | Freq: Two times a day (BID) | ORAL | 2 refills | Status: AC

## 2019-03-29 NOTE — Telephone Encounter (Signed)
Scheduled appt per 9/9 los - gave patient AVS and calender per los.   

## 2019-03-29 NOTE — Progress Notes (Signed)
[place] OFFICE PROGRESS NOTE  Patient Care Team: [name], MD as PCP - General (Internal Medicine) Annia Belt, MD as Consulting Physician (Oncology) Leta Baptist, MD as Consulting Physician (Otolaryngology) Eppie Gibson, MD as Attending Physician (Radiation Oncology) Tish Men, MD as Consulting Physician (Hematology) Leota Sauers, RN as Oncology Nurse Navigator  HEME/ONC OVERVIEW: 1. 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 Melene Plan, invasive SCCa, p16+; not a candidate for TORS -Late 10/2018 - 12/2018: definitive chemoradiation with weekly cisplatin   2. 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 -12/2018: acute DVT involving R peroneal vein and L gastrocnemius vein; new arterial thrombosis involving R common femoral artery and popliteal arteries, due to artery aneurysm -Late 12/2018: progression of acute DVT in the LLE from gastrocnemius vein to the femoral, popliteal, posterior tibial and peroneal veins despite being on Eliquis  -Currently on warfarin   3. Port placed in 10/2018; patient declined PEG   TREATMENT REGIMEN:  11/08/2018 - 12/29/2018: definitive chemoradiation with weekly cisplatin x 6  Anticoagulation:  Early 2000 - 12/2018: warfarin, non-compliant   12/2018: Eliquis 5mg  BID x ~4 weeks with clot progression  Late 12/2018 - present: warfarin  PERTINENT NON-HEM/ONC PROBLEMS: 1. Tobacco and EtOH abuse 2. Paroxysmal A-fib 3. 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 Benjamine Mola; I will ask the H&N navigator to coordinate ENT follow-up   Recurrent LLE DVT -Currently on warfarin -INR 2.9 on 03/20/2019  -No evidence of lower extremity swelling on exam today  -Continue follow-up with anticoagulation clinic  -Goal of anticoagulation is lifelong   Chemoradiation-associated mucositis -Secondary to chemoradiation -Overall improving but still mildly persistent; he does salt/soda rinses periodically and takes occasional ibuprofen -I encouraged the patient to try viscous lidocaine before meals and see if it would help with odynophagia   Thick saliva -Secondary to radiation damage to the salivary glands -He does salt/soda rinses 2-3x/day -I recommended the patient to increase the frequency of salt/soda rinses, and have prescribed Mucinex ER BID, which can help with thick secretions -He was counseled on the importance of maintaining adequate hydration    Port-a-cath in place -If PET shows NED, we can remove the port   Orders Placed This Encounter  Procedures  . Magnesium    Add on    Standing Status:   Future    Standing Expiration Date:   05/02/2020   All questions were answered. The patient knows to call the clinic with any problems, questions or concerns. No barriers to learning was detected.  A total of more than 25 minutes were spent face-to-face with the patient during this encounter and over half of that time was spent on counseling and coordination of care as outlined above.   Tentatively return  to clinic in 2 months for labs and clinic follow-up.  Tish Men, MD 03/29/2019 4:04 PM  CHIEF COMPLAINT: "I am doing alright"  INTERVAL HISTORY: [name] [name] returns to  clinic for follow-up of squamous cell carcinoma of the left tonsil s/p definitive chemoradiation.  He reports that he still has thick secretions, for which he uses salt/baking soda rinses 2-3 times a day with modest relief.  In addition, he still has throat pain with swallowing (both liquids and solids), for which he takes ibuprofen occasionally with some improvement, but the pain is not hampering his ability to eat and drink.  His weight has been relatively stable.  He is compliant with warfarin and being monitored closely by the anticoagulation clinic.  He has not had any recurrent swelling in the legs or abnormal bleeding/bruising on warfarin.  He denies any other complaint today.  SUMMARY OF ONCOLOGIC HISTORY: Oncology History  Malignant neoplasm of tonsillar fossa ([place])  10/10/2018 Imaging   CT neck w/ contrast: FINDINGS: Pharynx and larynx: There is asymmetric prominence of the left tonsillar region. No other mucosal or submucosal lesion is seen.  Salivary glands: Parotid and submandibular glands are normal.  Thyroid: Normal  Lymph nodes: Definite adenopathy on the left. 2 level nodes which shows some internal necrosis. The larger measures 2.2 cm in diameter and the smaller measures 1.4 cm in diameter. Suspicious level 4 node image 83 just anterior to the jugular vein measuring 9 mm short axis diameter. No worrisome nodes on the right.   10/10/2018 Imaging   CT chest:  IMPRESSION: 1. Mild right hilar adenopathy, equivocal for metastasis. No additional potential evidence of metastatic disease in the chest. PET-CT could be considered for further staging evaluation, as clinically warranted. 2. 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 Ascending thoracic aortic aneurysm. Recommend semi-annual imaging followup by CTA or MRA and referral to cardiothoracic surgery if not already obtained. This recommendation follows 2010 ACCF/AHA/AATS/ACR/ASA/SCA/SCAI/SIR/STS/SVM Guidelines for  the Diagnosis and Management of Patients With Thoracic Aortic Disease. Circulation. 2010; [id number]. Aortic aneurysm NOS (ICD10-I71.9).   10/17/2018 Initial Diagnosis   Tonsil cancer ([place])   10/18/2018 Imaging   PET: IMPRESSION: 1. 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 2. Local hypermetabolic nodal metastasis to the LEFT level II nodal station. 3. No contralateral nodal metastasis. 4. No evidence distant metastatic disease. 5. Two small pulmonary nodules in the LEFT lower lobe are favored benign. Recommend attention on follow-up. 6. Several small common iliac lymph nodes with low metabolic activity. Favor reactive adenopathy.   10/21/2018 Cancer Staging   Staging form: Pharynx - HPV-Mediated Oropharynx, AJCC 8th Edition - Clinical: Stage I (cT2, cN1, cM0, p16+) - Signed by Eppie Gibson, MD on 10/21/2018   11/10/2018 -  Chemotherapy   The patient had palonosetron (ALOXI) injection 0.25 mg, 0.25 mg, Intravenous,  Once, 6 of 7 cycles Administration: 0.25 mg (11/10/2018), 0.25 mg (11/17/2018), 0.25 mg (12/22/2018), 0.25 mg (11/24/2018), 0.25 mg (12/15/2018), 0.25 mg (12/29/2018) CISplatin (PLATINOL) 100 mg in sodium chloride 0.9 % 500 mL chemo infusion, 40 mg/m2 = 100 mg, Intravenous,  Once, 6 of 7 cycles Dose modification: 30 mg/m2 (original dose 40 mg/m2, Cycle 5, Reason: Dose not tolerated) Administration: 100 mg (11/10/2018), 100 mg (11/17/2018), 75 mg (12/22/2018), 100 mg (11/24/2018), 75 mg (12/15/2018), 75 mg (12/29/2018) fosaprepitant (EMEND) 150 mg, dexamethasone (DECADRON) 12 mg in sodium chloride 0.9 % 145 mL IVPB, , Intravenous,  Once, 6 of 7 cycles Administration:  (11/10/2018),  (11/17/2018),  (12/22/2018),  (  11/24/2018),  (12/15/2018),  (12/29/2018)  for chemotherapy treatment.      REVIEW OF SYSTEMS:   Constitutional: ( - ) fevers, ( - )  chills , ( - ) night sweats Eyes: ( - ) blurriness of vision, ( - ) double vision, ( - ) watery eyes Ears,  nose, mouth, throat, and face: ( + ) mucositis, ( + ) sore throat Respiratory: ( - ) cough, ( - ) dyspnea, ( - ) wheezes Cardiovascular: ( - ) palpitation, ( - ) chest discomfort, ( - ) lower extremity swelling Gastrointestinal:  ( - ) nausea, ( - ) heartburn, ( - ) change in bowel habits Skin: ( - ) abnormal skin rashes Lymphatics: ( - ) new lymphadenopathy, ( - ) easy bruising Neurological: ( - ) numbness, ( - ) tingling, ( - ) new weaknesses Behavioral/Psych: ( - ) mood change, ( - ) new changes  All other systems were reviewed with the patient and are negative.  I have reviewed the past medical history, past surgical history, social history and family history with the patient and they are unchanged from previous note.  ALLERGIES:  has No Known Allergies.  MEDICATIONS:  Current Outpatient Medications  Medication Sig Dispense Refill  . amLODipine (NORVASC) 10 MG tablet Take 1 tablet (10 mg total) by mouth daily. 90 tablet 2  . atorvastatin (LIPITOR) 20 MG tablet Take 1 tablet (20 mg total) by mouth daily. 30 tablet 11  . guaiFENesin (MUCINEX) 600 MG 12 hr tablet Take 1 tablet (600 mg total) by mouth 2 (two) times daily. For thick oral secretions. Must maintain adequate hydration. 60 tablet 2  . ibuprofen (ADVIL) 200 MG tablet Take 600 mg by mouth every 6 (six) hours as needed for pain or fever.     . nystatin (MYCOSTATIN) 100000 UNIT/ML suspension Take 5 mLs (500,000 Units total) by mouth 4 (four) times daily. 60 mL 0  . sodium fluoride (PREVIDENT 5000 PLUS) 1.1 % CREA dental cream Apply to tooth brush. Brush teeth for 2 minutes. Spit out excess. DO NOT rinse afterwards. Repeat nightly. 1 Tube prn  . triamcinolone lotion (KENALOG) 0.1 % Apply to affected area twice a day 60 mL 0  . warfarin (COUMADIN) 10 MG tablet Take 1 tablet of 10 mg warfarin daily except on Tuesday and Friday take 1/2 tablet (5 mg) 30 tablet 0   No current facility-administered medications for this visit.     Facility-Administered Medications Ordered in Other Visits  Medication Dose Route Frequency Provider Last Rate Last Dose  . heparin lock flush 100 unit/mL  500 Units Intravenous Once Tish Men, MD      . sodium chloride flush (NS) 0.9 % injection 10 mL  10 mL Intravenous PRN Tish Men, MD      . sodium chloride flush (NS) 0.9 % injection 10 mL  10 mL Intracatheter PRN Tish Men, MD   10 mL at 03/29/19 1504    PHYSICAL EXAMINATION: ECOG PERFORMANCE STATUS: 1 - Symptomatic but completely ambulatory  Today's Vitals   03/29/19 1515 03/29/19 1523  BP:  114/88  Pulse:  96  Resp:  18  Temp:  98.2 F (36.8 C)  TempSrc:  Oral  SpO2:  99%  Weight:  237 lb 6.4 oz (107.7 kg)  Height:  6\' 3"  (1.905 m)  PainSc: 2     Body mass index is 29.67 kg/m.  Filed Weights   03/29/19 1523  Weight: 237 lb 6.4 oz (107.7 kg)  GENERAL: alert, no distress and comfortable SKIN: skin color, texture, turgor are normal, no rashes or significant lesions EYES: conjunctiva are pink and non-injected, sclera clear OROPHARYNX: no exudate, no erythema; lips, buccal mucosa, and tongue normal  NECK: supple, non-tender, some lymphadema under the chin  LYMPH:  no palpable lymphadenopathy in the cervical LUNGS: clear to auscultation with normal breathing effort HEART: regular rate & rhythm and no murmurs and no lower extremity edema ABDOMEN: soft, non-tender, non-distended, normal bowel sounds Musculoskeletal: no cyanosis of digits and no clubbing  PSYCH: alert & oriented x 3, fluent speech NEURO: no focal motor/sensory deficits  LABORATORY DATA:  I have reviewed the data as listed    Component Value Date/Time   NA 139 03/29/2019 1505   NA 142 10/03/2018 1450   NA 141 05/21/2014 1226   K 4.1 03/29/2019 1505   K 4.3 05/21/2014 1226   CL 103 03/29/2019 1505   CL 108 (H) 12/28/2012 1405   CO2 25 03/29/2019 1505   CO2 28 05/21/2014 1226   GLUCOSE 96 03/29/2019 1505   GLUCOSE 101 05/21/2014 1226    GLUCOSE 95 12/28/2012 1405   BUN 12 03/29/2019 1505   BUN 15 10/03/2018 1450   BUN 13.2 05/21/2014 1226   CREATININE 0.91 03/29/2019 1505   CREATININE 1.00 09/24/2014 1413   CREATININE 0.9 05/21/2014 1226   CALCIUM 9.9 03/29/2019 1505   CALCIUM 9.3 05/21/2014 1226   PROT 7.0 03/29/2019 1505   PROT 7.2 10/03/2018 1450   PROT 7.1 01/08/2014 1203   ALBUMIN 4.2 03/29/2019 1505   ALBUMIN 4.3 10/03/2018 1450   ALBUMIN 3.7 01/08/2014 1203   AST 14 (L) 03/29/2019 1505   AST 50 (H) 01/08/2014 1203   ALT 15 03/29/2019 1505   ALT 83 (H) 01/08/2014 1203   ALKPHOS 86 03/29/2019 1505   ALKPHOS 87 01/08/2014 1203   BILITOT 0.4 03/29/2019 1505   BILITOT 0.67 01/08/2014 1203   GFRNONAA >60 03/29/2019 1505   GFRNONAA >60 10/15/2011 0224   GFRAA >60 03/29/2019 1505   GFRAA >60 10/15/2011 0224    No results found for: SPEP, UPEP  Lab Results  Component Value Date   WBC 5.9 03/29/2019   NEUTROABS 4.3 03/29/2019   HGB 16.3 03/29/2019   HCT 47.3 03/29/2019   MCV 98.5 03/29/2019   PLT 157 03/29/2019      Chemistry      Component Value Date/Time   NA 139 03/29/2019 1505   NA 142 10/03/2018 1450   NA 141 05/21/2014 1226   K 4.1 03/29/2019 1505   K 4.3 05/21/2014 1226   CL 103 03/29/2019 1505   CL 108 (H) 12/28/2012 1405   CO2 25 03/29/2019 1505   CO2 28 05/21/2014 1226   BUN 12 03/29/2019 1505   BUN 15 10/03/2018 1450   BUN 13.2 05/21/2014 1226   CREATININE 0.91 03/29/2019 1505   CREATININE 1.00 09/24/2014 1413   CREATININE 0.9 05/21/2014 1226      Component Value Date/Time   CALCIUM 9.9 03/29/2019 1505   CALCIUM 9.3 05/21/2014 1226   ALKPHOS 86 03/29/2019 1505   ALKPHOS 87 01/08/2014 1203   AST 14 (L) 03/29/2019 1505   AST 50 (H) 01/08/2014 1203   ALT 15 03/29/2019 1505   ALT 83 (H) 01/08/2014 1203   BILITOT 0.4 03/29/2019 1505   BILITOT 0.67 01/08/2014 1203       RADIOGRAPHIC STUDIES: I have personally reviewed the radiological images as listed below and agreed  with the  findings in the report. No results found.

## 2019-03-31 ENCOUNTER — Telehealth: Payer: Self-pay | Admitting: *Deleted

## 2019-04-01 NOTE — Progress Notes (Signed)
Oncology Nurse Navigator Documentation  Joined Mr. Webster during WebEx post-tmt follow-up with Dr. Isidore Moos. He reported: "Doing OK", L-side throat discomfort, minimal taste sensation (sweet), denied issues with oral intake, thinks he is maintaining weight. He voiced understanding he will be referred to PT for tmt of post-RT lymphedema, SLP for HEP, RTC to see Dr. Isidore Moos 9/22 to discuss results of 9/21 re-staging PET. I encouraged him to call me with needs/concerns.  He agreed to do so.  Gayleen Orem, RN, BSN Head & Neck Oncology Nurse [place] at [place] [phone]

## 2019-04-01 NOTE — Telephone Encounter (Signed)
Oncology Nurse Navigator Documentation  In follow-up to pt's 9/9 post-tmt with Dr. Maylon Peppers, called ENT Dr. Deeann Saint office to coordinate appointment.  Spoke with Nira Conn, requested patient be contacted and scheduled for follow-up s/p re-staging PET tentatively scheduled for 9/21.  She voiced understanding.  Gayleen Orem, RN, BSN Head & Neck Oncology Nurse [place] at [place] [phone]

## 2019-04-04 ENCOUNTER — Other Ambulatory Visit: Payer: Self-pay

## 2019-04-04 ENCOUNTER — Encounter: Payer: Self-pay | Admitting: Physical Therapy

## 2019-04-04 ENCOUNTER — Ambulatory Visit: Payer: Medicaid Other | Admitting: Physical Therapy

## 2019-04-04 ENCOUNTER — Telehealth: Payer: Self-pay | Admitting: *Deleted

## 2019-04-04 DIAGNOSIS — I890 Lymphedema, not elsewhere classified: Secondary | ICD-10-CM

## 2019-04-04 DIAGNOSIS — R293 Abnormal posture: Secondary | ICD-10-CM

## 2019-04-04 DIAGNOSIS — R1310 Dysphagia, unspecified: Secondary | ICD-10-CM | POA: Diagnosis not present

## 2019-04-04 NOTE — Telephone Encounter (Signed)
CALLED PATIENT TO INFORM OF PET SCAN FOR 04-10-19, LVM FOR A RETURN CALL

## 2019-04-04 NOTE — Therapy (Addendum)
[place], [place], [address] Phone: [phone]   Fax:  [phone]  Physical Therapy Evaluation  Patient Details  Name: [name] [name] MRN: [id number] Date of Birth: Mar 10, 1961 Referring Provider (PT): Reita May Date: 04/04/2019  PT End of Session - 04/04/19 1632    Visit Number  1    Number of Visits  4    Date for PT Re-Evaluation  05/02/19    Authorization Type  sending for Medicaid auth today    Authorization - Visit Number  1    Authorization - Number of Visits  4    PT Start Time  [id number]    PT Stop Time  1630    PT Time Calculation (min)  25 min    Activity Tolerance  Patient tolerated treatment well    Behavior During Therapy  [facility] for tasks assessed/performed       Past Medical History:  Diagnosis Date  . 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 Benign essential HTN 01/04/2012  . Chronic anticoagulation 01/02/2013  . Dermatofibroma of forearm 01/02/2013   Left side  . Hyperlipidemia, mixed 01/04/2012  . 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 Primary hypercoagulable state ([place]) 10/01/2014  . Sinus bradycardia, chronic 01/04/2012  . Superficial thrombosis of lower extremity 05/02/2012    Past Surgical History:  Procedure Laterality Date  . DIRECT LARYNGOSCOPY Left 10/19/2018   Procedure: DIRECT LARYNGOSCOPY;  Surgeon: Leta Baptist, MD;  Location: [facility];  Service: ENT;  Laterality: Left;  . IR IMAGING GUIDED PORT INSERTION  11/04/2018  . TONSILLECTOMY Left 10/19/2018   Procedure: BIOPSY OF LEFT TONSIL;  Surgeon: Leta Baptist, MD;  Location: [facility];  Service: ENT;  Laterality: Left;    There were no vitals filed for this visit.   Subjective Assessment - 04/04/19 1608    Subjective   I don't know when my swelling started. It has been a few weeks actually it has been there for months.    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  Yes    Pain Score  3     Pain Location  Throat    Pain Orientation  Left    Pain Descriptors / Indicators  Burning    Pain Type  Acute pain    Pain Onset  More than a month ago    Pain Frequency  Constant    Aggravating Factors   eating sugar and citris foods    Pain Relieving Factors  taking ibuprophen    Effect of Pain on Daily Activities  hard to eat and drink at times         [facility] PT Assessment - 04/04/19 0001      Assessment   Medical Diagnosis  L tonsil squamous cell carcinoma    Referring Provider (PT)  Isidore Moos    Onset Date/Surgical Date  10/19/18    Hand Dominance  Right    Prior Therapy  none      Precautions   Precautions  Other (comment)    Precaution  Comments  lymphedema      Restrictions   Weight Bearing Restrictions  No      Balance Screen   Has the patient fallen in the past 6 months  No    Has the patient had a decrease in activity level because of a fear of falling?   Yes   sometimes has trouble going up/down steps, feels its improvi   Is the patient reluctant to leave their home because of a fear of falling?   No      Home Film/video editor residence    Living Arrangements  Spouse/significant other    Available Help at Discharge  Family    Type of [place] to enter    Entrance Stairs-Number of Steps  6    Entrance Stairs-Rails  Can reach both    [place]  Two level    Alternate Level Stairs-Number of Steps  14      Prior Function   Level of Independence  Independent    Vocation  Unemployed    Leisure  pt reports he lifts a few weights and does some  walking       Cognition   Overall Cognitive Status  Within Functional Limits for tasks assessed      Observation/Other Assessments   Observations  fullness at anterior neck      ROM / Strength   AROM / PROM / Strength  AROM      AROM   Overall AROM Comments  UE WFL    AROM Assessment Site  Cervical    Cervical Flexion  WFL    Cervical Extension  WFL    Cervical - Right Side Bend  WFL    Cervical - Left Side Bend  WFL    Cervical - Right Rotation  WFL    Cervical - Left Rotation  WFL        LYMPHEDEMA/ONCOLOGY QUESTIONNAIRE - 04/04/19 1617      Type   Cancer Type  L tonsil squamous cell carcinoma      Date Lymphedema/Swelling Started   Date  02/18/19      Treatment   Active Chemotherapy Treatment  No    Past Chemotherapy Treatment  Yes    Active Radiation Treatment  No    Past Radiation Treatment  Yes      What other symptoms do you have   Are you Having Heaviness or Tightness  Yes    Are you having Pain  No    Are you having pitting edema  No    Is it Hard or Difficult finding clothes that fit  No    Do you have infections  No      Lymphedema Assessments   Lymphedema Assessments  Head and Neck      Head and Neck   4 cm superior to sternal notch around neck  47.5 cm    6 cm superior to sternal notch around neck  48.5 cm    8 cm superior to sternal notch around neck  50.2 cm    Other  52   10 cm superior to sternal notch around neck   Other                Objective measurements completed on examination: See above findings.      [facility] Adult PT Treatment/Exercise - 04/04/19 0001      Manual Therapy  Manual Therapy  Edema management    Edema Management  created foam chip pack for pt to wear for head and neck swelling                  PT Long Term Goals - 04/04/19 1636      PT LONG TERM GOAL #1   Title  Pt will be able to independently manage lymphedema through self MLD and compression garments    Time  4    Period  Weeks     Status  New    Target Date  05/02/19      PT LONG TERM GOAL #2   Title  Pt will receive appropriate compression garments for long term management of lymphedema    Time  4    Period  Weeks    Status  New    Target Date  05/02/19      PT LONG TERM GOAL #3   Title  Pt will reduce swelling at 8 cm superior to sternal notch by 2 cm to decrease risk of swelling.    Baseline  50.2 cm    Time  4    Period  Weeks    Status  New    Target Date  05/02/19             Plan - 04/04/19 1633    Clinical Impression Statement  Pt presents to PT following chemo and radiation treatment for L tonsil squamous cell carcinoma p16+. Pt began developing marked anterior neck swelling a few weeks ago. He reports he does not have discomfort from the swelling. His anterior neck is visibly very full. Pt would benefit from skilled PT services to decrease neck lymphedema and instruct pt how to independently manage lymphedema.    Personal Factors and Comorbidities  Comorbidity 1    Comorbidities  history of multiple DVT and PE    Stability/Clinical Decision Making  Stable/Uncomplicated    Clinical Decision Making  Low    Rehab Potential  Good    PT Frequency  Other (comment)   3 visits per Josem Kaufmann   PT Duration  4 weeks    PT Treatment/Interventions  ADLs/Self Care Home Management;Therapeutic exercise;Manual lymph drainage;Manual techniques;Compression bandaging;Taping    PT Next Visit Plan Schedule pt to be measured by [facility] on Oct 2,  begin MLD to head and neck and instruct pt and issue handout, see if chip pack is helping, possibly work on balance once lymphedema is managed    PT Home Exercise Plan  wear chip pack    Consulted and Agree with Plan of Care  Patient       Patient will benefit from skilled therapeutic intervention in order to improve the following deficits and impairments:  Postural dysfunction, Decreased strength, Increased edema, Decreased knowledge of precautions  Visit  Diagnosis: Lymphedema, not elsewhere classified  Abnormal posture     Problem List Patient Active Problem List   Diagnosis Date Noted  . Warfarin anticoagulation 02/20/2019  . Hypercholesteremia 02/07/2019  . Recurrent deep vein thrombosis (DVT) ([place]) 02/06/2019  . Thoracic ascending aortic aneurysm ([place]) 02/06/2019  . Port-A-Cath in place 01/18/2019  . Mucositis due to chemotherapy 12/07/2018  . Hypomagnesemia 12/07/2018  . Thrush 12/07/2018  . Malignant neoplasm of tonsillar fossa ([place]) 10/17/2018  . 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 DVT, lower extremity, proximal ([place]) 05/24/2013  . Chronic anticoagulation 01/02/2013  . Superficial  thrombosis of lower extremity 05/02/2012  . Benign essential HTN 01/04/2012  . Hyperlipidemia, mixed 01/04/2012    [facility] 04/04/2019, 4:39 PM  [place], [place], [address] Phone: [phone]   Fax:  [phone]  Name: [name] [name] MRN: [id number] Date of Birth: [birth year]  Manus Gunning, PT 04/04/19 4:39 PM

## 2019-04-06 NOTE — Progress Notes (Signed)
Mr. Kreisman presents for followup of radiation completed 12/28/18 to his tonsil.    Pain issues, if any: He reports pain to his throat when swallowing.  Using a feeding tube?: No Weight changes, if any:  Wt Readings from Last 3 Encounters:  04/11/19 240 lb 4 oz (109 kg)  03/29/19 237 lb 6.4 oz (107.7 kg)  03/15/19 240 lb 3.2 oz (109 kg)   Swallowing issues, if any: He is eating most everything he would like. He does tell me that orange juice burns his throat.  Smoking or chewing tobacco? He denies.  Using fluoride trays daily? He is using them occasionally. He is using fluoride toothpaste.  Last ENT visit was on: Not recently.  Other notable issues, if any:  He continues to report taste changes.  Dr. Maylon Peppers 03/29/19 next 05/24/19 He is seeing PT for neck lymphedema.  PET completed 04/10/19. He is here for results.   BP 131/79 (BP Location: Left Arm, Patient Position: Sitting)   Pulse 73   Temp 98.2 F (36.8 C) (Temporal)   Resp 18   Ht 6\' 3"  (1.905 m)   Wt 240 lb 4 oz (109 kg)   SpO2 98%   BMI 30.03 kg/m

## 2019-04-07 ENCOUNTER — Telehealth: Payer: Self-pay | Admitting: *Deleted

## 2019-04-07 NOTE — Telephone Encounter (Signed)
Oncology Nurse Navigator Documentation  LVMM for Mr. Berkovitz reminding him of [place] attendance next Tuesday morning for 10:45 appt with SLP after which he will proceed to 11:40 appt with Dr. Isidore Moos.  I encouraged 10:15 arrival for lobby registration so he can arrive to Radiation Waiting by 10:30.  Asked for call-back to confirm message receipt.  Gayleen Orem, RN, BSN Head & Neck Oncology Nurse [place] at [place] [phone]

## 2019-04-10 ENCOUNTER — Other Ambulatory Visit: Payer: Self-pay

## 2019-04-10 ENCOUNTER — Ambulatory Visit (HOSPITAL_COMMUNITY)
Admission: RE | Admit: 2019-04-10 | Discharge: 2019-04-10 | Disposition: A | Discharge status: Home / Self Care | Payer: Medicaid Other | Source: Ambulatory Visit | Attending: Radiation Oncology | Admitting: Radiation Oncology

## 2019-04-10 ENCOUNTER — Telehealth: Payer: Self-pay | Admitting: *Deleted

## 2019-04-10 DIAGNOSIS — C760 Malignant neoplasm of head, face and neck: Secondary | ICD-10-CM | POA: Diagnosis not present

## 2019-04-10 DIAGNOSIS — C090 Malignant neoplasm of tonsillar fossa: Secondary | ICD-10-CM | POA: Diagnosis not present

## 2019-04-10 LAB — GLUCOSE, CAPILLARY: Glucose-Capillary: 121 mg/dL — ABNORMAL HIGH (ref 70–99)

## 2019-04-10 MED ORDER — FLUDEOXYGLUCOSE F - 18 (FDG) INJECTION
12.4000 | Freq: Once | INTRAVENOUS | Status: AC | PRN
  Administered 2019-04-10: 13:00:00 12.4 via INTRAVENOUS

## 2019-04-10 NOTE — Telephone Encounter (Signed)
Oncology Nurse Navigator Documentation  Spoke with Mr. Demiko, confirmed 1030 arrival tomorrow morning to register for [place] followed by 11:40 follow-up with Dr. Isidore Moos.  Gayleen Orem, RN, BSN Head & Neck Oncology Nurse [place] at [place] [phone]

## 2019-04-11 ENCOUNTER — Encounter: Payer: Self-pay | Admitting: *Deleted

## 2019-04-11 ENCOUNTER — Ambulatory Visit: Payer: Medicaid Other

## 2019-04-11 ENCOUNTER — Ambulatory Visit
Admission: RE | Admit: 2019-04-11 | Discharge: 2019-04-11 | Disposition: A | Discharge status: Home / Self Care | Payer: Medicaid Other | Source: Ambulatory Visit | Attending: Radiation Oncology | Admitting: Radiation Oncology

## 2019-04-11 ENCOUNTER — Other Ambulatory Visit: Payer: Self-pay

## 2019-04-11 ENCOUNTER — Encounter: Payer: Self-pay | Admitting: Radiation Oncology

## 2019-04-11 DIAGNOSIS — J439 Emphysema, unspecified: Secondary | ICD-10-CM | POA: Insufficient documentation

## 2019-04-11 DIAGNOSIS — I890 Lymphedema, not elsewhere classified: Secondary | ICD-10-CM | POA: Diagnosis not present

## 2019-04-11 DIAGNOSIS — F17210 Nicotine dependence, cigarettes, uncomplicated: Secondary | ICD-10-CM | POA: Insufficient documentation

## 2019-04-11 DIAGNOSIS — R1310 Dysphagia, unspecified: Secondary | ICD-10-CM | POA: Diagnosis not present

## 2019-04-11 DIAGNOSIS — I700 Atherosclerosis of aorta: Secondary | ICD-10-CM | POA: Diagnosis not present

## 2019-04-11 DIAGNOSIS — Z7901 Long term (current) use of anticoagulants: Secondary | ICD-10-CM | POA: Insufficient documentation

## 2019-04-11 DIAGNOSIS — R293 Abnormal posture: Secondary | ICD-10-CM | POA: Diagnosis not present

## 2019-04-11 DIAGNOSIS — Z79899 Other long term (current) drug therapy: Secondary | ICD-10-CM | POA: Diagnosis not present

## 2019-04-11 DIAGNOSIS — R918 Other nonspecific abnormal finding of lung field: Secondary | ICD-10-CM | POA: Diagnosis not present

## 2019-04-11 DIAGNOSIS — C090 Malignant neoplasm of tonsillar fossa: Secondary | ICD-10-CM | POA: Insufficient documentation

## 2019-04-11 NOTE — Progress Notes (Addendum)
Radiation Oncology         [phone] ________________________________  Name: CAP MASSI MRN: [id number]  Date: 04/11/2019  DOB: [birth year]  Follow-Up Visit Note in person, outpatient  CC: [name], MD  Annia Belt, MD  Diagnosis and Prior Radiotherapy:       ICD-10-CM   1. Malignant neoplasm of tonsillar fossa ([facility])  C09.0     11/08/2018 - 12/28/2018: Left Tonsil / 70 Gy in 35 fractions  CHIEF COMPLAINT:  Here for follow-up and surveillance of tonsillar cancer  Narrative:  The patient returns today for routine follow-up and to review his recent scans. He last saw Dr. Maylon Peppers on 03/29/2019. He met with physical therapy on 04/04/2019 for his neck lymphedema. He has not seen ENT recently.  He underwent post-treatment PET scan yesterday, 04/10/2019. This showed: interval decrease in left tonsillar hypermetabolism, CT imaging does demonstrate some residual soft tissue fullness in this region; interval resolution of left-sided hypermetabolic nodal metastases; no evidence for hypermetabolic disease in the chest, abdomen, or pelvis; regarding the two previously seen tiny left lower lobe pulmonary nodules, one is stable and the other has resolved; unchanged small bilateral common iliac nodes, likely reactive.  On review of systems, he reports pain with swallowing. He is able to eat most anything but notes orange juice burns his throat. He reports continued taste changes. He endorses using fluoride toothpaste and occasionally using fluoride trays.  Thick secretions.  He is smoking occasionally.  ALLERGIES:  has No Known Allergies.  Meds: Current Outpatient Medications  Medication Sig Dispense Refill   amLODipine (NORVASC) 10 MG tablet Take 1 tablet (10 mg total) by mouth daily. 90 tablet 2   atorvastatin (LIPITOR) 20 MG tablet Take 1 tablet (20 mg total) by mouth daily. 30 tablet 11   guaiFENesin (MUCINEX) 600 MG 12 hr tablet Take 1 tablet (600 mg total) by mouth 2 (two)  times daily. For thick oral secretions. Must maintain adequate hydration. 60 tablet 2   ibuprofen (ADVIL) 200 MG tablet Take 600 mg by mouth every 6 (six) hours as needed for pain or fever.      nystatin (MYCOSTATIN) 100000 UNIT/ML suspension Take 5 mLs (500,000 Units total) by mouth 4 (four) times daily. 60 mL 0   sodium fluoride (PREVIDENT 5000 PLUS) 1.1 % CREA dental cream Apply to tooth brush. Brush teeth for 2 minutes. Spit out excess. DO NOT rinse afterwards. Repeat nightly. 1 Tube prn   triamcinolone lotion (KENALOG) 0.1 % Apply to affected area twice a day 60 mL 0   warfarin (COUMADIN) 10 MG tablet Take 1 tablet of 10 mg warfarin daily except on Tuesday and Friday take 1/2 tablet (5 mg) 30 tablet 0   No current facility-administered medications for this encounter.    Facility-Administered Medications Ordered in Other Encounters  Medication Dose Route Frequency Provider Last Rate Last Dose   heparin lock flush 100 unit/mL  500 Units Intravenous Once Tish Men, MD       sodium chloride flush (NS) 0.9 % injection 10 mL  10 mL Intravenous PRN Tish Men, MD        Physical Findings: The patient is in no acute distress. Patient is alert and oriented. Wt Readings from Last 3 Encounters:  04/11/19 240 lb 4 oz (109 kg)  03/29/19 237 lb 6.4 oz (107.7 kg)  03/15/19 240 lb 3.2 oz (109 kg)    height is _0  (1.905 m) and weight is 240 lb 4 oz (109  kg). His temporal temperature is 98.2 F (36.8 C). His blood pressure is 131/79 and his pulse is 73. His respiration is 18 and oxygen saturation is 98%. .   General: Alert and oriented, in no acute distress HEENT: white film on tongue,?thrush. Resolving mucositis in left oropharynx. Hard to view completely due to gag reflex. Neck:  +Lymphedema, no masses. Psychiatric: Judgment and insight are intact. Affect is appropriate   Lab Findings: Lab Results  Component Value Date   WBC 5.9 03/29/2019   HGB 16.3 03/29/2019   HCT 47.3 03/29/2019     MCV 98.5 03/29/2019   PLT 157 03/29/2019    Lab Results  Component Value Date   TSH 1.404 01/18/2019    Radiographic Findings: Nm Pet Image Restag (ps) Skull Base To Thigh  Result Date: 04/10/2019 CLINICAL DATA:  Subsequent treatment strategy for head neck cancer. EXAM: NUCLEAR MEDICINE PET SKULL BASE TO THIGH TECHNIQUE: 12.4 mCi F-18 FDG was injected intravenously. Full-ring PET imaging was performed from the skull base to thigh after the radiotracer. CT data was obtained and used for attenuation correction and anatomic localization. Fasting blood glucose: 121 mg/dl COMPARISON:  10/18/2018 FINDINGS: Mediastinal blood pool activity: SUV max 2.7 Liver activity: SUV max NA NECK: Persistent but decreased hypermetabolism noted in the left tonsillar region. Activity in this area demonstrates SUV max = 7.3 today compared to 14.3 previously. Hypermetabolic left cervical lymphadenopathy seen on the previous study has resolved with no hypermetabolism in this region today and no lymphadenopathy visible on CT imaging. Incidental CT findings: Persistent asymmetric soft tissue fullness noted left tonsillar region. CHEST: No hypermetabolic mediastinal or hilar nodes. No suspicious pulmonary nodules on the CT scan. Incidental CT findings: Right Port-A-Cath tip is positioned in the mid SVC. Atherosclerotic calcification is noted in the wall of the thoracic aorta. Dependent atelectasis noted both lower lobes. Centrilobular emphsyema noted. 6 mm left lower lobe pulmonary nodule seen previously is stable (99/4 today). A second tiny left lower lobe pulmonary nodule seen previously has resolved in the interval. ABDOMEN/PELVIS: No abnormal hypermetabolic activity within the liver, pancreas, adrenal glands, or spleen. No hypermetabolic lymph nodes in the abdomen or pelvis. Incidental CT findings: Multiple probable cysts noted in each kidney similar to prior. 9 mm short axis right common femoral node measured previously is 7  mm short axis today (185/4). The small left external iliac node measured previously at 9 mm is 8 mm today. Asymmetric enlargement of the right common iliac artery noted with diameter measuring 2.9 cm. There is abdominal aortic atherosclerosis without aneurysm. Mild left diverticulosis noted without diverticulitis. SKELETON: No focal hypermetabolic activity to suggest skeletal metastasis. Incidental CT findings: none IMPRESSION: 1. Interval decrease in hypermetabolism associated with the left tonsillar region. CT imaging does demonstrate some residual soft tissue fullness in this region. 2. Interval resolution of left-sided level II hypermetabolic nodal metastases. 3. No evidence for hypermetabolic metastatic disease in the chest, abdomen, or pelvis. 4. 2 tiny left lower lobe pulmonary nodules were identified on the previous study. One of these nodules is stable and the other has resolved. Continued attention on follow-up suggested. 5. The small bilateral common iliac nodes seen previously are unchanged, likely reactive 6.  Aortic Atherosclerois (ICD10-170.0) 7.  Emphysema. ([id number]) Electronically Signed   By: Misty Stanley M.D.   On: 04/10/2019 15:29    Impression/Plan:    1) Head and Neck Cancer Status: healing from treatments - favor NED per PET.   2) Nutritional Status: stable  PEG  tube: none Filed Weights   04/11/19 1104  Weight: 240 lb 4 oz (109 kg)    3) Risk Factors: The patient has been educated about risk factors including alcohol and tobacco abuse; they understand that avoidance of alcohol and tobacco is important to prevent recurrences as well as other cancers  I asked the patient today about tobacco use. The patient uses tobacco.  I advised the patient to quit. Services were offered by me today including outpatient counseling and pharmacotherapy. I assessed for the willingness to attempt to quit and provided encouragement and demonstrated willingness to make referrals and/or  prescriptions to help the patient attempt to quit. The patient has follow-up with the oncologic team to touch base on their tobacco use and /or cessation efforts.  Over 3 minutes were spent on this issue. He'll quit today without medication, has smoked rarely.  4) Swallowing: good function, continue exercises of SLP for prevention of long term dysphagia  5) Dental: Encouraged to continue regular followup with dentistry, and dental hygiene including fluoride treatment  6) Thyroid function: WNL Lab Results  Component Value Date   TSH 1.404 01/18/2019    7) Other: Gayleen Orem, RN, our Head and Neck Oncology Navigator will place him on tumor board for review.  Will arrange f/u in 3-6 mo with imaging recommended by tumor board consensus for surveillance. Pt will call Dr Benjamine Mola for f/u in the next month to follow the left tonsillar region closely. Continue med onc f/u as well.  NYSTATIN Rx for thrush.  In a face to face visit lasting 15 minutes, greater than 50% of the time was spent  discussing his condition, and coordinating the patient's care.  _____________________________________   Eppie Gibson, MD  This document serves as a record of services personally performed by Eppie Gibson, MD. It was created on her behalf by Wilburn Mylar, a trained medical scribe. The creation of this record is based on the scribe's personal observations and the provider's statements to them. This document has been checked and approved by the attending provider.

## 2019-04-12 ENCOUNTER — Other Ambulatory Visit: Payer: Self-pay | Admitting: Radiation Oncology

## 2019-04-12 ENCOUNTER — Encounter: Payer: Self-pay | Admitting: Physical Therapy

## 2019-04-12 ENCOUNTER — Other Ambulatory Visit: Payer: Self-pay | Admitting: Hematology

## 2019-04-12 ENCOUNTER — Ambulatory Visit: Payer: Medicaid Other | Admitting: Physical Therapy

## 2019-04-12 ENCOUNTER — Encounter: Payer: Self-pay | Admitting: Radiation Oncology

## 2019-04-12 DIAGNOSIS — R293 Abnormal posture: Secondary | ICD-10-CM | POA: Diagnosis not present

## 2019-04-12 DIAGNOSIS — C090 Malignant neoplasm of tonsillar fossa: Secondary | ICD-10-CM

## 2019-04-12 DIAGNOSIS — I890 Lymphedema, not elsewhere classified: Secondary | ICD-10-CM

## 2019-04-12 DIAGNOSIS — R1310 Dysphagia, unspecified: Secondary | ICD-10-CM | POA: Diagnosis not present

## 2019-04-12 MED ORDER — NYSTATIN 100000 UNIT/ML MT SUSP: 5.0000 mL | Freq: Four times a day (QID) | OROMUCOSAL | 0 refills | Status: DC

## 2019-04-12 NOTE — Patient Instructions (Signed)
SWALLOWING EXERCISES Do these until March 30, then 2 times per week afterwards  1. Effortful Swallows - Press your tongue against the roof of your mouth for 3 seconds, then squeeze          the muscles in your neck while you swallow your saliva or a sip of water - Repeat 10-15 times, 2 times a day, and use whenever you eat or drink  2. Masako Swallow - swallow with your tongue sticking out - Stick tongue out past your teeth and gently bite tongue with your teeth - Swallow, while holding your tongue with your teeth - Repeat 10-15 times, 2 times a day *use a wet spoon if your mouth gets dry*  3. Mendelsohn Maneuver - "half swallow" exercise - Start to swallow, and keep your Adam's apple up by squeezing hard with the            muscles of the throat - Hold the squeeze for 5-7 seconds and then relax - Repeat 10-15 times, 2 times a day *use a wet spoon if your mouth gets dry*              4.  "Super swallow"  - take a breath  - bear down  - swallow and cough  - repeat ten times

## 2019-04-12 NOTE — Therapy (Signed)
[place], [place], [address] Phone: [phone]   Fax:  [phone]  Physical Therapy Treatment  Patient Details  Name: [name] [name] MRN: [id number] Date of Birth: 1961/03/31 Referring Provider (PT): Reita May Date: 04/12/2019  PT End of Session - 04/12/19 1625    Visit Number  2    Date for PT Re-Evaluation  05/02/19    Authorization - Visit Number  2    Authorization - Number of Visits  4    PT Start Time  [id number]    PT Stop Time  1610    PT Time Calculation (min)  40 min    Activity Tolerance  Patient tolerated treatment well    Behavior During Therapy  [facility] for tasks assessed/performed       Past Medical History:  Diagnosis Date  . 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 Benign essential HTN 01/04/2012  . Chronic anticoagulation 01/02/2013  . Dermatofibroma of forearm 01/02/2013   Left side  . Hyperlipidemia, mixed 01/04/2012  . 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 Primary hypercoagulable state ([place]) 10/01/2014  . Sinus bradycardia, chronic 01/04/2012  . Superficial thrombosis of lower extremity 05/02/2012    Past Surgical History:  Procedure Laterality Date  . DIRECT LARYNGOSCOPY Left 10/19/2018   Procedure: DIRECT LARYNGOSCOPY;  Surgeon: Leta Baptist, MD;  Location: [facility];  Service: ENT;  Laterality: Left;  . IR IMAGING GUIDED PORT INSERTION  11/04/2018  . TONSILLECTOMY Left 10/19/2018   Procedure: BIOPSY OF LEFT TONSIL;  Surgeon: Leta Baptist, MD;  Location: [facility];  Service: ENT;  Laterality: Left;    There were no vitals filed for this visit.  Subjective Assessment - 04/12/19 1540    Subjective  "I'm doing ok"  His girlfriend was not able to come with him today .    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  Yes    Pain Score  2     Pain Location  Throat    Pain Orientation  Left    Pain Descriptors / Indicators  Burning    Pain Type  Acute pain    Pain Onset  More than a month ago    Pain Frequency  Constant    Aggravating Factors   eating    Pain Relieving Factors  nothing , iburophen                       [facility] Adult PT Treatment/Exercise - 04/12/19 0001      Manual Therapy   Manual Therapy  Edema management;Manual Lymphatic Drainage (MLD)    Manual therapy comments  pt has darkening of skin from radiation on check and both sides of neck and upper shoulders .  Full area at left supraclavicular area     Edema Management  talked to pt about Flexitouch and gave him brochure to think about.  Showed him picuter of tribute head and neck garment and he ok's sending demographics to get this measured on Oct. 2 gave him written handout about self MLD and link to klosetraining video  Manual Lymphatic Drainage (MLD)  in supine with head of mat elevated, short neck, diaphragmatic breaths, both axillary circles, stationary circles at front of neck toward chest  extra time spent on lateral left neck with firm area felt there., then to partial right sidelying for left supraclavicular area to posterior shoulder and neck              PT Education - 04/12/19 1624    Education Details  lymph system and self MLD    Person(s) Educated  Patient    Methods  Explanation;Demonstration;Handout    Comprehension  Need further instruction          PT Long Term Goals - 04/04/19 1636      PT LONG TERM GOAL #1   Title  Pt will be able to independently manage lymphedema through self MLD and compression garments    Time  4    Period  Weeks    Status  New    Target Date  05/02/19      PT LONG TERM GOAL #2    Title  Pt will receive appropriate compression garments for long term management of lymphedema    Time  4    Period  Weeks    Status  New    Target Date  05/02/19      PT LONG TERM GOAL #3   Title  Pt will reduce swelling at 8 cm superior to sternal notch by 2 cm to decrease risk of swelling.    Baseline  50.2 cm    Time  4    Period  Weeks    Status  New    Target Date  05/02/19            Plan - 04/12/19 1626    Clinical Impression Statement  Pt tried the chip pack and said he thinks it helped.Today he has significant lymphedema in anterior neck with fibrosis in left lateral neck and fullness extending to posterior neck and left supraclavicular area  His skin is sitll very darkened from radiation He will be getting compression garment and will benefit from a Flexitouch as well and will consider that    Stability/Clinical Decision Making  Stable/Uncomplicated    PT Treatment/Interventions  ADLs/Self Care Home Management;Therapeutic exercise;Manual lymph drainage;Manual techniques;Compression bandaging;Taping    PT Next Visit Plan  get script that was returned for [place]  MLD to head and neck and instruct pt per handout issued 9/23. continue to work on fullness in neck and supraclavicular area.  Reinforce neck ROM  possibly work on balance once lymphedema is managed    Consulted and Agree with Plan of Care  Patient       Patient will benefit from skilled therapeutic intervention in order to improve the following deficits and impairments:  Postural dysfunction, Decreased strength, Increased edema, Decreased knowledge of precautions  Visit Diagnosis: Abnormal posture  Lymphedema, not elsewhere classified     Problem List Patient Active Problem List   Diagnosis Date Noted  . Warfarin anticoagulation 02/20/2019  . Hypercholesteremia 02/07/2019  . Recurrent deep vein thrombosis (DVT) ([place]) 02/06/2019  . Thoracic ascending aortic aneurysm ([place]) 02/06/2019  .  Port-A-Cath in place 01/18/2019  . Mucositis due to chemotherapy 12/07/2018  . Hypomagnesemia 12/07/2018  . Thrush 12/07/2018  . Malignant neoplasm of tonsillar fossa ([place]) 10/17/2018  . 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 DVT, lower extremity, proximal ([place])  05/24/2013  . Chronic anticoagulation 01/02/2013  . Superficial thrombosis of lower extremity 05/02/2012  . Benign essential HTN 01/04/2012  . Hyperlipidemia, mixed 01/04/2012   [name]  [facility] 04/12/2019, [place], [place], [address] Phone: [phone]   Fax:  [phone]  Name: [name] [name] MRN: [id number] Date of Birth: [birth year]

## 2019-04-12 NOTE — Patient Instructions (Signed)
[email or web address] Resources Self care videos  MLD for head and neck     Manual lymph drainage for the neck, anterior approach  Sit in front of a mirror. Do 5 slow deep breaths, breathing in through the nose and out through the mouth, letting your belly "inflate" as you breathe in.  Rest your hands on your abdomen as you do this to give slight pressure there.  1) Place hands on areas just behind collar bones and do 10 stationary circles with stretch in outward directions. 2) Do stationary circles at each armpit about 10 times. 3) Place one hand on the front of the opposite shoulder and do stationary circles with stretch downward toward underarm. 4) Repeat #1 above. 5) Imagine a river running in a line from the earlobe straight down the neck.  Place hands just behind this and do circles with stretch coming forward slightly and down, thinking about fluid flowing down that river.  Do 10 times. 6) Place one hand just in front of the river on one side and do circles with a slight back and then downward stretch, thinking again about putting that fluid in the river.  Do 10-20 times on each side. 7) Place one hand just slightly in front of the spot you just did and do the same thing.  DO THIS VERY GENTLY. 8) Use the webspace between your thumb and index finger to pump downward starting just under the chin and "stair stepping" downward with a stretch, working down to the chest. 9) Repeat #1. 10) Do stationary circles on each side of the face just above the chin, out and down with the stretch 10 times. 11) Do stationary circles on each side of the face on the cheeks with pressure going back and down, 10 times. 12) Do stationary circles on each side of the face between the eyes and ears, again back and downward 10 times. 13) Repeat steps 9,8,7,6,5,1,3 and 2 in that order!  Do not slide on the skin, but STRETCH it with your motions. Only give enough pressure to stretch the skin. DO THIS SLOWLY,  PLEASE!  And do once a day.

## 2019-04-12 NOTE — Progress Notes (Signed)
Oncology Nurse Navigator Documentation  Met with Mr. Prosch upon his arrival for [facility].  He was seen by SLP Garald Balding  Following [facility]DC, he had follow-up with Dr. Isidore Moos which included discussion of yesterday's PET results. He voiced understanding of results, guidance for surveillance CTs in several months. I encouraged him to call me with needs/concerns, he agreed.  Gayleen Orem, RN, BSN Head & Neck Oncology [place] at [place] [phone]

## 2019-04-12 NOTE — Therapy (Signed)
[facility] [address] [place], [place], [address] Phone: [phone]   Fax:  [phone]  Speech Language Pathology Evaluation  Patient Details  Name: [name] [name] MRN: [id number] Date of Birth: 07/01/1961 Referring Provider (SLP): Eppie Gibson MD   Encounter Date: 04/11/2019  End of Session - 04/12/19 1019    Visit Number  1    Number of Visits  7    Date for SLP Re-Evaluation  07/10/19    Authorization Type  MEDICAID    SLP Start Time  [id number]    SLP Stop Time   1122    SLP Time Calculation (min)  37 min    Activity Tolerance  Patient tolerated treatment well       Past Medical History:  Diagnosis Date  . 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 Benign essential HTN 01/04/2012  . Chronic anticoagulation 01/02/2013  . Dermatofibroma of forearm 01/02/2013   Left side  . Hyperlipidemia, mixed 01/04/2012  . 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 Primary hypercoagulable state ([place]) 10/01/2014  . Sinus bradycardia, chronic 01/04/2012  . Superficial thrombosis of lower extremity 05/02/2012    Past Surgical History:  Procedure Laterality Date  . DIRECT LARYNGOSCOPY Left 10/19/2018   Procedure: DIRECT LARYNGOSCOPY;  Surgeon: Leta Baptist, MD;  Location: [facility];  Service: ENT;  Laterality: Left;  . IR IMAGING GUIDED PORT INSERTION  11/04/2018  . TONSILLECTOMY Left 10/19/2018   Procedure: BIOPSY OF LEFT TONSIL;  Surgeon: Leta Baptist, MD;  Location: [facility];  Service: ENT;  Laterality: Left;    There were no vitals filed for this visit.  Subjective Assessment - 04/12/19 1014    Subjective  "Pizza, I've had. But I have no taste."    Currently in Pain?  Yes    Pain Score  2     Pain Location  Throat    Pain Orientation   Left    Pain Descriptors / Indicators  Burning         SLP Evaluation [facility] - 04/12/19 1014      SLP Visit Information   SLP Received On  04/11/19    Referring Provider (SLP)  Eppie Gibson MD    Onset Date  November 2019    Medical Diagnosis  Lt tonsillar SCC      General Information   HPI  Pt presented to PCP with lump in lt neck. TORS consult at [facility] but pt wasn't candidate. Pt completed chemorad tx on 12-28-18. No PEGplaced, pt refused dental extractions.       Oral Motor/Sensory Function   Overall Oral Motor/Sensory Function  deferred due to clinic masking policy        Pt reports currently tolerating pizza. Today, pt demonstrated no overt s/sx of aspiration with water and deli [place] with mayonaise. Thyroid elevation appeared adequate, and swallows appeared timely. Pt  Reported needing more water than prior to radiation tx for his POs.   Because data states the risk for dysphagia after radiation treatment is high due to undergoing radiation tx, SLP taught pt about the possibility of reduced/limited ability for PO intake during rad tx. SLP educated pt re: changes to swallowing musculature after rad tx, and why adherence to dysphagia HEP provided today and PO consumption was necessary to  inhibit muscle fibrosis following rad tx. Pt demonstrated understanding of these things to SLP.    SLP then developed a HEP for pt and pt was instructed how to perform exercises involving lingual, vocal, and pharyngeal strengthening. SLP performed each exercise and pt return demonstrated each exercise. SLP ensured pt performance was correct prior to moving on to next exercise. Pt was instructed to complete this program 2 times a day for 6 months, then x2 a week after that.                SLP Education - 04/12/19 1019    Education Details  HEP procedure, rationale for HEP, late effects head/neck radiation    Person(s) Educated  Patient    Methods  Explanation;Demonstration;Verbal  cues;Handout    Comprehension  Verbalized understanding;Returned demonstration;Verbal cues required;Need further instruction       SLP Short Term Goals - 04/12/19 [place] #1   Title  Pt will complete HEP with rare min A over two sessions    Baseline  total A    Time  2    Period  --   visits, for all STGs   Status  New      SLP SHORT TERM GOAL #2   Title  Pt will demo knowledge of rationale for HEP completion    Baseline  total A    Time  2    Period  --   vistis   Status  New      SLP SHORT TERM GOAL #3   Title  Pt will tell SLP how a food journal can hasten/facilitate return to more normalized diet    Baseline  not provided yet    Time  2    Period  --   visits   Status  New       SLP Long Term Goals - 04/12/19 1052      SLP LONG TERM GOAL #1   Title  Pt will complete HEP with modified independence over 3 visits    Baseline  total A    Time  4    Period  --   visits   Status  New      SLP LONG TERM GOAL #2   Title  Pt will tell SLP when to decr frequency of HEP    Baseline  total A    Time  4    Period  --   visits   Status  New       Plan - 04/12/19 1020    Clinical Impression Statement  Pt presents today with WNL/WFL swallowing ability, with deli [place] slices and water. Pt reports ageusia complicates his PO intake. No overt s/sx aspiration PNA reported or observed today. Data suggests that WNL swallowing is threatened by muscle fibrosis that will likely develop after rad/chemorad is completed. Therefore, skilled ST would be beneficial to the pt in order to regularly assess pt's safety with POs and/or need for instrumental swallow assessment, as well as to assess proper completion of HEP.    Speech Therapy Frequency  --   once approx every four weeks   Duration  --   6 visits/7 total visits   Treatment/Interventions  Aspiration precaution training;Pharyngeal strengthening exercises;Diet toleration management by SLP;Trials of  upgraded texture/liquids;Cueing hierarchy;Patient/family education;SLP instruction and feedback;Environmental controls;Compensatory strategies    Potential to Achieve Goals  Good    SLP Home Exercise Plan  provided today  Consulted and Agree with Plan of Care  Patient       Patient will benefit from skilled therapeutic intervention in order to improve the following deficits and impairments:   Dysphagia, unspecified type    Problem List Patient Active Problem List   Diagnosis Date Noted  . Warfarin anticoagulation 02/20/2019  . Hypercholesteremia 02/07/2019  . Recurrent deep vein thrombosis (DVT) ([place]) 02/06/2019  . Thoracic ascending aortic aneurysm ([place]) 02/06/2019  . Port-A-Cath in place 01/18/2019  . Mucositis due to chemotherapy 12/07/2018  . Hypomagnesemia 12/07/2018  . Thrush 12/07/2018  . Malignant neoplasm of tonsillar fossa ([place]) 10/17/2018  . 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 DVT, lower extremity, proximal ([place]) 05/24/2013  . Chronic anticoagulation 01/02/2013  . Superficial thrombosis of lower extremity 05/02/2012  . Benign essential HTN 01/04/2012  . Hyperlipidemia, mixed 01/04/2012    [facility] ,MS, CCC-SLP  04/12/2019, 10:59 AM  [facility] [address] [place], [place], [address] Phone: [phone]   Fax:  [phone]  Name: [name] [name] MRN: [id number] Date of Birth: 09-16-1960

## 2019-04-13 ENCOUNTER — Telehealth: Payer: Self-pay | Admitting: *Deleted

## 2019-04-13 NOTE — Telephone Encounter (Signed)
Oncology Nurse Navigator Documentation  LVMM informing pt b/co favorable PET results, Dr. Maylon Peppers has placed order for [facility] removal, he can expect call from [facility] IR to schedule appt.  Encouraged call-back if any questions.  Gayleen Orem, RN, BSN Head & Neck Oncology Nurse [place] at [place] [phone]

## 2019-04-13 NOTE — Telephone Encounter (Addendum)
Oncology Nurse Navigator Documentation  Per patient's 9/22 post-treatment follow-up with Dr. Isidore Moos, called ENT Dr. Deeann Saint office to coordinate appointment.  Spoke with Anderson Malta, requested patient be contacted and scheduled for routine follow-up in one month, to include laryngoscopy per Dr. Deeann Saint discretion.  She verbalized understanding.  Gayleen Orem, RN, BSN Head & Neck Oncology Nurse [place] at [place] [phone]

## 2019-04-17 ENCOUNTER — Ambulatory Visit (INDEPENDENT_AMBULATORY_CARE_PROVIDER_SITE_OTHER): Payer: Medicaid Other | Admitting: Pharmacist

## 2019-04-17 ENCOUNTER — Other Ambulatory Visit: Payer: Self-pay

## 2019-04-17 DIAGNOSIS — Z86718 Personal history of other venous thrombosis and embolism: Secondary | ICD-10-CM

## 2019-04-17 DIAGNOSIS — I2782 Chronic pulmonary embolism: Secondary | ICD-10-CM

## 2019-04-17 DIAGNOSIS — Z5181 Encounter for therapeutic drug level monitoring: Secondary | ICD-10-CM | POA: Diagnosis not present

## 2019-04-17 DIAGNOSIS — Z7901 Long term (current) use of anticoagulants: Secondary | ICD-10-CM | POA: Diagnosis not present

## 2019-04-17 DIAGNOSIS — C760 Malignant neoplasm of head, face and neck: Secondary | ICD-10-CM

## 2019-04-17 LAB — POCT INR: INR: 6.3 — AB (ref 2.0–3.0)

## 2019-04-17 MED ORDER — WARFARIN SODIUM 10 MG PO TABS: ORAL_TABLET | ORAL | 1 refills | Status: DC

## 2019-04-17 NOTE — Progress Notes (Signed)
Anticoagulation Management [name] [name] is a 58 y.o. male who reports to the clinic for monitoring of warfarin treatment.    Indication: PE, history of; DVT, history of: Long term current use of anticoagulant.   Duration: indefinite Supervising physician: [facility] Visit History: Patient does not report signs/symptoms of bleeding or thromboembolism  Other recent changes: No diet, medications, lifestyle changes except as noted in patient findings.  Anticoagulation Episode Summary    Current INR goal:  2.5-3.5  TTR:  33.4 % (7.9 y)  Next INR check:  04/24/2019  INR from last check:  6.3 (04/17/2019)  Weekly max warfarin dose:    Target end date:    INR check location:    Preferred lab:    Send INR reminders to:  ANTICOAG [facility]   Indications   Pulmonary embolism ([place]) (Resolved) [I26.99]       Comments:        Anticoagulation Care Providers    Provider Role Specialty Phone number   Annia Belt, MD Referring Oncology [phone]      No Known Allergies  Current Outpatient Medications:  .  amLODipine (NORVASC) 10 MG tablet, Take 1 tablet (10 mg total) by mouth daily., Disp: 90 tablet, Rfl: 2 .  atorvastatin (LIPITOR) 20 MG tablet, Take 1 tablet (20 mg total) by mouth daily., Disp: 30 tablet, Rfl: 11 .  nystatin (MYCOSTATIN) 100000 UNIT/ML suspension, Take 5 mLs (500,000 Units total) by mouth 4 (four) times daily. Swish for 60 sec, then swallow. Continue QID until you use up bottle., Disp: 473 mL, Rfl: 0 .  sodium fluoride (PREVIDENT 5000 PLUS) 1.1 % CREA dental cream, Apply to tooth brush. Brush teeth for 2 minutes. Spit out excess. DO NOT rinse afterwards. Repeat nightly., Disp: 1 Tube, Rfl: prn .  triamcinolone lotion (KENALOG) 0.1 %, Apply to affected area twice a day, Disp: 60 mL, Rfl: 0 .  warfarin (COUMADIN) 10 MG tablet, Take 1 tablet of 10 mg warfarin daily except on Tuesday and Friday take 1/2 tablet (5 mg), Disp: 30 tablet, Rfl:  0 .  guaiFENesin (MUCINEX) 600 MG 12 hr tablet, Take 1 tablet (600 mg total) by mouth 2 (two) times daily. For thick oral secretions. Must maintain adequate hydration. (Patient not taking: Reported on 04/17/2019), Disp: 60 tablet, Rfl: 2 .  ibuprofen (ADVIL) 200 MG tablet, Take 600 mg by mouth every 6 (six) hours as needed for pain or fever. , Disp: , Rfl:  No current facility-administered medications for this visit.   Facility-Administered Medications Ordered in Other Visits:  .  heparin lock flush 100 unit/mL, 500 Units, Intravenous, Once, Tish Men, MD .  sodium chloride flush (NS) 0.9 % injection 10 mL, 10 mL, Intravenous, PRN, Tish Men, MD Past Medical History:  Diagnosis Date  . 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 Benign essential HTN 01/04/2012  . Chronic anticoagulation 01/02/2013  . Dermatofibroma of forearm 01/02/2013   Left side  . Hyperlipidemia, mixed 01/04/2012  . 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 Primary hypercoagulable state ([place]) 10/01/2014  . Sinus bradycardia, chronic 01/04/2012  . Superficial thrombosis of lower extremity 05/02/2012   Social History   Socioeconomic History  . Marital status: Divorced    Spouse name: Not on file  . Number of children: 2  . Years  of education: Not on file  . Highest education level: Not on file  Occupational History  . Not on file  Social Needs  . Financial resource strain: Not on file  . Food insecurity    Worry: Not on file    Inability: Not on file  . Transportation needs    Medical: No    Non-medical: No  Tobacco Use  . Smoking status: Former Smoker    Packs/day: 0.50    Years: 30.00    Pack years: 15.00    Types: Cigarettes    Quit date: 10/19/2018    Years since quitting: 0.4  . Smokeless tobacco: Never Used  Substance and Sexual Activity   . Alcohol use: Yes    Alcohol/week: 13.0 - 14.0 standard drinks    Types: 12 Cans of beer, 1 - 2 Shots of liquor per week    Comment: 1-2 times per week.  . Drug use: No  . Sexual activity: Not on file  Lifestyle  . Physical activity    Days per week: Not on file    Minutes per session: Not on file  . Stress: Not on file  Relationships  . Social Herbalist on phone: Not on file    Gets together: Not on file    Attends religious service: Not on file    Active member of club or organization: Not on file    Attends meetings of clubs or organizations: Not on file    Relationship status: Not on file  Other Topics Concern  . Not on file  Social History Narrative  . Not on file   Family History  Problem Relation Age of Onset  . Stroke Father     ASSESSMENT Recent Results: The most recent result is correlated with 55 mg per week: Lab Results  Component Value Date   INR 6.3 (A) 04/17/2019   INR 2.9 03/20/2019   INR 3.7 (A) 02/20/2019   PROTIME 28.8 (H) 02/04/2015    Anticoagulation Dosing: Description   OMIT doses on Tuesday 18-Apr-2019 and Wednesday 19-Apr-2019. Recommence warfarin on Thursday 20-Apr-2019 by taking only 1/2 of your 10mg  white-colored warfarin tablet on Thursday and Friday. On Saturday 22-Apr-2019 and Sunday, 23-Apr-2019, take one (1) tablet of your 10mg  strength white warfarin tablets. Return to clinic for repeat INR on Monday 24-Apr-2019 at 4:00PM.     INR today: Supratherapeutic  PLAN Weekly dose was decreased by 36% by OMITTING two days dosing, and then decreasing total weekly dose to 35mg  beginning on Thursday through Sunday, with INR repeat on Monday 24-Apr-2019 at [facility].   Patient Instructions  Patient instructed to take medications as defined in the Anti-coagulation Track section of this encounter.  Patient instructed to OMIT today's dose--but he states he has already taken today's dose. Patient instructed to OMIT doses on Tuesday 18-Apr-2019  and Wednesday 19-Apr-2019. Recommence warfarin on Thursday 20-Apr-2019 by taking only 1/2 of your 10mg  white-colored warfarin tablet on Thursday and Friday. On Saturday 22-Apr-2019 and Sunday, 23-Apr-2019, take one (1) tablet of your 10mg  strength white warfarin tablets. Return to clinic for repeat INR on Monday 24-Apr-2019 at 4:00PM. Patient verbalized understanding of these instructions.    Patient advised to contact clinic or seek medical attention if signs/symptoms of bleeding or thromboembolism occur.  Patient verbalized understanding by repeating back information and was advised to contact me if further medication-related questions arise. Patient was also provided an information handout.  Follow-up Return in 1 week (on 04/24/2019) for  Follow up INR.  Pennie Banter, PharmD, CPP  15 minutes spent face-to-face with the patient during the encounter. 50% of time spent on education, including signs/sx bleeding and clotting, as well as food and drug interactions with warfarin. 50% of time was spent on fingerprick POC INR sample collection,processing, results determination, and documentation in http://www.kim.net/.

## 2019-04-17 NOTE — Patient Instructions (Signed)
Patient instructed to take medications as defined in the Anti-coagulation Track section of this encounter.  Patient instructed to OMIT today's dose--but he states he has already taken today's dose. Patient instructed to OMIT doses on Tuesday 18-Apr-2019 and Wednesday 19-Apr-2019. Recommence warfarin on Thursday 20-Apr-2019 by taking only 1/2 of your 10mg  white-colored warfarin tablet on Thursday and Friday. On Saturday 22-Apr-2019 and Sunday, 23-Apr-2019, take one (1) tablet of your 10mg  strength white warfarin tablets. Return to clinic for repeat INR on Monday 24-Apr-2019 at 4:00PM. Patient verbalized understanding of these instructions.

## 2019-04-18 ENCOUNTER — Encounter: Payer: Self-pay | Admitting: Physical Therapy

## 2019-04-18 ENCOUNTER — Ambulatory Visit: Payer: Medicaid Other | Admitting: Physical Therapy

## 2019-04-18 DIAGNOSIS — I890 Lymphedema, not elsewhere classified: Secondary | ICD-10-CM | POA: Diagnosis not present

## 2019-04-18 DIAGNOSIS — R1310 Dysphagia, unspecified: Secondary | ICD-10-CM | POA: Diagnosis not present

## 2019-04-18 DIAGNOSIS — R293 Abnormal posture: Secondary | ICD-10-CM | POA: Diagnosis not present

## 2019-04-18 NOTE — Therapy (Signed)
[facility], [place], [address] Phone: [phone]   Fax:  [phone]  Physical Therapy Treatment  Patient Details  Name: [name] [name] MRN: [id number] Date of Birth: [birth year] Referring Provider (PT): Reita May Date: 04/18/2019  PT End of Session - 04/18/19 1645    Visit Number  3    Number of Visits  4    Date for PT Re-Evaluation  05/02/19    Authorization Type  auth 04/05/19 to 05/02/19    Authorization - Visit Number  3    Authorization - Number of Visits  4    PT Start Time  [id number]    PT Stop Time  1640    PT Time Calculation (min)  41 min       Past Medical History:  Diagnosis Date  . 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 Benign essential HTN 01/04/2012  . Chronic anticoagulation 01/02/2013  . Dermatofibroma of forearm 01/02/2013   Left side  . Hyperlipidemia, mixed 01/04/2012  . 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 Primary hypercoagulable state ([place]) 10/01/2014  . Sinus bradycardia, chronic 01/04/2012  . Superficial thrombosis of lower extremity 05/02/2012    Past Surgical History:  Procedure Laterality Date  . DIRECT LARYNGOSCOPY Left 10/19/2018   Procedure: DIRECT LARYNGOSCOPY;  Surgeon: Leta Baptist, MD;  Location: [facility];  Service: ENT;  Laterality: Left;  . IR IMAGING GUIDED PORT INSERTION  11/04/2018  . TONSILLECTOMY Left 10/19/2018   Procedure: BIOPSY OF LEFT TONSIL;  Surgeon: Leta Baptist, MD;  Location: [facility];  Service: ENT;  Laterality: Left;    There were no vitals filed for this visit.  Subjective Assessment - 04/18/19 1607    Subjective  The massage is going ok. I am not doing as much as I would like to. I wear the chip pack a lot.    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  Yes    Pain Score  2     Pain Location  Throat    Pain Orientation  Left    Pain Descriptors / Indicators  Burning    Pain Type  Acute pain    Pain Onset  More than a month ago                       [facility] Adult PT Treatment/Exercise - 04/18/19 0001      Manual Therapy   Edema Management  pt agreeable to compression pump and wishes to have demographics sent to [facility], signed pt up to be measured for garments on Oct 12    Manual Lymphatic Drainage (MLD)  instructed pt's wife in the following and had her return demonstrate: with pt in supine head of bed elevated: 5 diaphragmatic breaths, short neck, bilateral axillary nodes, bilateral pectoral nodes, posterior, lateral and anterior neck moving fluid towards pathways. Pt's wife require mod verbal and tactile cues to perform correctly. Pt's neck was more pliable at end of session.  PT Education - 04/18/19 2346    Education Details  self MLD    Person(s) Educated  Spouse    Methods  Explanation;Demonstration;Tactile cues;Verbal cues;Handout    Comprehension  Returned demonstration;Verbal cues required;Tactile cues required          PT Long Term Goals - 04/04/19 1636      PT LONG TERM GOAL #1   Title  Pt will be able to independently manage lymphedema through self MLD and compression garments    Time  4    Period  Weeks    Status  New    Target Date  05/02/19      PT LONG TERM GOAL #2   Title  Pt will receive appropriate compression garments for long term management of lymphedema    Time  4    Period  Weeks    Status  New    Target Date  05/02/19      PT LONG TERM GOAL #3   Title  Pt will reduce swelling at 8 cm superior to sternal notch by 2 cm to decrease risk of swelling.     Baseline  50.2 cm    Time  4    Period  Weeks    Status  New    Target Date  05/02/19            Plan - 04/18/19 2347    Clinical Impression Statement  Pt brought his spouse to the appointment to learn MLD today. Instructed her in correct technique and sequence and had her return demonstrate. Fibrosis in anterior neck softened signfincantly after treatment session. Pt wants to proceed with checking benefits for a compression pump. He is now signed up to be measured for a head and neck garment on October 12.    Rehab Potential  Good    PT Frequency  --   3 visits per Josem Kaufmann   PT Duration  4 weeks    PT Treatment/Interventions  ADLs/Self Care Home Management;Therapeutic exercise;Manual lymph drainage;Manual techniques;Compression bandaging;Taping    PT Next Visit Plan  get script that was returned for [place]  MLD to head and neck and instruct pt per handout issued 9/23. continue to work on fullness in neck and supraclavicular area.  Reinforce neck ROM  possibly work on balance once lymphedema is managed    PT Home Exercise Plan  wear chip pack    Consulted and Agree with Plan of Care  Patient;Family member/caregiver       Patient will benefit from skilled therapeutic intervention in order to improve the following deficits and impairments:  Postural dysfunction, Decreased strength, Increased edema, Decreased knowledge of precautions  Visit Diagnosis: Lymphedema, not elsewhere classified     Problem List Patient Active Problem List   Diagnosis Date Noted  . Warfarin anticoagulation 02/20/2019  . Hypercholesteremia 02/07/2019  . Recurrent deep vein thrombosis (DVT) ([place]) 02/06/2019  . Thoracic ascending aortic aneurysm ([place]) 02/06/2019  . Port-A-Cath in place 01/18/2019  . Mucositis due to chemotherapy 12/07/2018  . Hypomagnesemia 12/07/2018  . Thrush 12/07/2018  . Malignant neoplasm of tonsillar fossa ([place]) 10/17/2018  . Arterial embolus and thrombosis of lower  extremity ([place]) 05/25/2017  . 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 DVT, lower extremity, proximal ([place]) 05/24/2013  . Chronic anticoagulation 01/02/2013  . Superficial thrombosis of lower extremity 05/02/2012  . Benign essential HTN 01/04/2012  . Hyperlipidemia, mixed 01/04/2012    Allyson Sabal [facility] 04/18/2019, 11:50 PM  [facility]  [place], [place], [address] Phone: [phone]   Fax:  [phone]  Name: CHARLES BRINKERHOFF MRN: [id number] Date of Birth: 09-20-1960  Manus Gunning, PT 04/18/19 11:51 PM

## 2019-04-24 ENCOUNTER — Ambulatory Visit: Payer: Medicaid Other

## 2019-04-25 ENCOUNTER — Ambulatory Visit: Payer: Medicaid Other | Attending: Radiation Oncology | Admitting: Physical Therapy

## 2019-04-25 ENCOUNTER — Encounter: Payer: Self-pay | Admitting: Physical Therapy

## 2019-04-25 ENCOUNTER — Other Ambulatory Visit: Payer: Self-pay

## 2019-04-25 ENCOUNTER — Ambulatory Visit (INDEPENDENT_AMBULATORY_CARE_PROVIDER_SITE_OTHER): Payer: Medicaid Other | Admitting: Pharmacist

## 2019-04-25 DIAGNOSIS — Z5181 Encounter for therapeutic drug level monitoring: Secondary | ICD-10-CM | POA: Diagnosis not present

## 2019-04-25 DIAGNOSIS — R293 Abnormal posture: Secondary | ICD-10-CM | POA: Insufficient documentation

## 2019-04-25 DIAGNOSIS — Z86711 Personal history of pulmonary embolism: Secondary | ICD-10-CM

## 2019-04-25 DIAGNOSIS — I890 Lymphedema, not elsewhere classified: Secondary | ICD-10-CM | POA: Diagnosis not present

## 2019-04-25 DIAGNOSIS — R1310 Dysphagia, unspecified: Secondary | ICD-10-CM | POA: Diagnosis not present

## 2019-04-25 DIAGNOSIS — I2782 Chronic pulmonary embolism: Secondary | ICD-10-CM

## 2019-04-25 DIAGNOSIS — Z7901 Long term (current) use of anticoagulants: Secondary | ICD-10-CM | POA: Diagnosis not present

## 2019-04-25 LAB — POCT INR: INR: 5.9 — AB (ref 2.0–3.0)

## 2019-04-25 NOTE — Therapy (Signed)
[place], [place], [address] Phone: [phone]   Fax:  [phone]  Physical Therapy Treatment  Patient Details  Name: [name] [name] MRN: [id number] Date of Birth: March 27, 1961 Referring Provider (PT): Reita May Date: 04/25/2019  PT End of Session - 04/25/19 1552    Visit Number  4    Number of Visits  4    Date for PT Re-Evaluation  05/02/19    Authorization Type  auth 04/05/19 to 05/02/19, sent for an additional 1x/wk for 4 wks on 04/25/19    Authorization - Visit Number  4    Authorization - Number of Visits  4    PT Start Time  [id number]   pt arrived late   PT Stop Time  1545    PT Time Calculation (min)  29 min    Activity Tolerance  Patient tolerated treatment well    Behavior During Therapy  [facility] for tasks assessed/performed       Past Medical History:  Diagnosis Date  . 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 Benign essential HTN 01/04/2012  . Chronic anticoagulation 01/02/2013  . Dermatofibroma of forearm 01/02/2013   Left side  . Hyperlipidemia, mixed 01/04/2012  . 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 Primary hypercoagulable state ([place]) 10/01/2014  . Sinus bradycardia, chronic 01/04/2012  . Superficial thrombosis of lower extremity 05/02/2012    Past Surgical History:  Procedure Laterality Date  . DIRECT LARYNGOSCOPY Left 10/19/2018   Procedure: DIRECT LARYNGOSCOPY;  Surgeon: Leta Baptist, MD;  Location: [facility];  Service: ENT;  Laterality: Left;  . IR IMAGING GUIDED PORT INSERTION  11/04/2018  . TONSILLECTOMY Left 10/19/2018   Procedure: BIOPSY OF LEFT TONSIL;  Surgeon: Leta Baptist, MD;  Location: [place];  Service: ENT;  Laterality: Left;    There were no vitals filed for this  visit.  Subjective Assessment - 04/25/19 1519    Subjective  I got a call from the guy about the pump and I have not called him back yet.    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  Yes    Pain Score  2     Pain Location  Throat    Pain Orientation  Left    Pain Descriptors / Indicators  Burning    Pain Type  Acute pain            LYMPHEDEMA/ONCOLOGY QUESTIONNAIRE - 04/25/19 1520      Head and Neck   4 cm superior to sternal notch around neck  46 cm    6 cm superior to sternal notch around neck  47.2 cm    8 cm superior to sternal notch around neck  50.4 cm                [facility] Adult PT Treatment/Exercise - 04/25/19 0001      Manual Therapy   Edema Management  cicumferential measurements taken    Manual Lymphatic Drainage (MLD)  in supine head of bed elevated: 5 diaphragmatic breaths, short neck, bilateral  axillary nodes, bilateral pectoral nodes, posterior, lateral and anterior neck moving fluid towards pathways                   PT Long Term Goals - 04/25/19 1550      PT LONG TERM GOAL #1   Title  Pt will be able to independently manage lymphedema through self MLD and compression garments    Baseline  04/25/19- pt to get measured for compression garments next week, he is still learning self MLD    Time  4    Period  Weeks    Status  On-going      PT LONG TERM GOAL #2   Title  Pt will receive appropriate compression garments for long term management of lymphedema    Baseline  04/25/19- pt will be measured for compression garments next week    Time  4    Period  Weeks    Status  On-going      PT LONG TERM GOAL #3   Title  Pt will reduce swelling at 8 cm superior to sternal notch by 2 cm to decrease risk of swelling.    Baseline  50.2 cm, 04/25/19-  50.4 but rest of neck demonstrates around 1.5 cm reduction    Time  4    Period  Weeks    Status  On-going            Plan - 04/25/19 1552    Clinical Impression Statement  Remeasured circumferences today and pt demonstrates around a 1.5 cm decrease at 4 and 6 cm above sternal notch. He had a slight increase at 8 cm above notch.The patient presents with I89.0 Head and Neck lymphedema with significant symptoms still remaining after conservative therapies. A Flexitouch pneumatic compression device is medically necessary for lifelong daily in home treatment of the patient's head and neck lymphedema. He will be measured for a compression garment next week.    PT Frequency  1x / week    PT Duration  4 weeks    PT Treatment/Interventions  ADLs/Self Care Home Management;Therapeutic exercise;Manual lymph drainage;Manual techniques;Compression bandaging;Taping    PT Next Visit Plan  check medicaid reauth, get script that was returned for [employer]  MLD to head and neck and instruct pt per handout issued 9/23. continue to work on fullness in neck and supraclavicular area.  Reinforce neck ROM  possibly work on balance once lymphedema is managed    PT Home Exercise Plan  wear chip pack    Consulted and Agree with Plan of Care  Patient       Patient will benefit from skilled therapeutic intervention in order to improve the following deficits and impairments:  Postural dysfunction, Decreased strength, Increased edema, Decreased knowledge of precautions  Visit Diagnosis: Lymphedema, not elsewhere classified     Problem List Patient Active Problem List   Diagnosis Date Noted  . Warfarin anticoagulation 02/20/2019  . Hypercholesteremia 02/07/2019  . Recurrent deep vein thrombosis (DVT) ([place]) 02/06/2019  . Thoracic ascending aortic aneurysm ([place]) 02/06/2019  . Port-A-Cath in place 01/18/2019  . Mucositis due to chemotherapy 12/07/2018  . Hypomagnesemia 12/07/2018  . Thrush 12/07/2018  .  Malignant neoplasm of tonsillar fossa ([place]) 10/17/2018  . 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 DVT, lower extremity, proximal ([place]) 05/24/2013  . Chronic anticoagulation 01/02/2013  . Superficial thrombosis of lower extremity 05/02/2012  . Benign essential HTN 01/04/2012  .  Hyperlipidemia, mixed 01/04/2012    Allyson Sabal [facility] 04/25/2019, 3:56 PM  [place], [place], [address] Phone: [phone]   Fax:  [phone]  Name: [name] [name] MRN: [id number] Date of Birth: [birth year]  Manus Gunning, PT 04/25/19 3:56 PM

## 2019-04-25 NOTE — Progress Notes (Signed)
Anticoagulation Management [name] [name] is a 58 y.o. male who reports to the clinic for monitoring of warfarin treatment.    Indication: History of PE; Current active malignant disease; Long term current use of anticoagulant.    Duration: indefinite Supervising physician: Aldine Contes  Anticoagulation Clinic Visit History: Patient does not report signs/symptoms of bleeding or thromboembolism  Other recent changes: No diet, medications, lifestyle changes except as noted in patient findings.  Anticoagulation Episode Summary    Current INR goal:  2.5-3.5  TTR:  33.3 % (7.9 y)  Next INR check:  05/02/2019  INR from last check:    Weekly max warfarin dose:    Target end date:    INR check location:    Preferred lab:    Send INR reminders to:  ANTICOAG [facility]   Indications   Pulmonary embolism (HCC) (Resolved) [I26.99]       Comments:        Anticoagulation Care Providers    Provider Role Specialty Phone number   Annia Belt, MD Referring Oncology [phone]      No Known Allergies  Current Outpatient Medications:  .  amLODipine (NORVASC) 10 MG tablet, Take 1 tablet (10 mg total) by mouth daily., Disp: 90 tablet, Rfl: 2 .  atorvastatin (LIPITOR) 20 MG tablet, Take 1 tablet (20 mg total) by mouth daily., Disp: 30 tablet, Rfl: 11 .  nystatin (MYCOSTATIN) 100000 UNIT/ML suspension, Take 5 mLs (500,000 Units total) by mouth 4 (four) times daily. Swish for 60 sec, then swallow. Continue QID until you use up bottle., Disp: 473 mL, Rfl: 0 .  sodium fluoride (PREVIDENT 5000 PLUS) 1.1 % CREA dental cream, Apply to tooth brush. Brush teeth for 2 minutes. Spit out excess. DO NOT rinse afterwards. Repeat nightly., Disp: 1 Tube, Rfl: prn .  triamcinolone lotion (KENALOG) 0.1 %, Apply to affected area twice a day, Disp: 60 mL, Rfl: 0 .  warfarin (COUMADIN) 10 MG tablet, Take 1 tablet of 10 mg warfarin daily except on Tuesday and Friday take 1/2 tablet (5 mg), Disp: 30 tablet,  Rfl: 1 .  guaiFENesin (MUCINEX) 600 MG 12 hr tablet, Take 1 tablet (600 mg total) by mouth 2 (two) times daily. For thick oral secretions. Must maintain adequate hydration. (Patient not taking: Reported on 04/17/2019), Disp: 60 tablet, Rfl: 2 .  ibuprofen (ADVIL) 200 MG tablet, Take 600 mg by mouth every 6 (six) hours as needed for pain or fever. , Disp: , Rfl:  No current facility-administered medications for this visit.   Facility-Administered Medications Ordered in Other Visits:  .  heparin lock flush 100 unit/mL, 500 Units, Intravenous, Once, Tish Men, MD .  sodium chloride flush (NS) 0.9 % injection 10 mL, 10 mL, Intravenous, PRN, Tish Men, MD Past Medical History:  Diagnosis Date  . 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 Benign essential HTN 01/04/2012  . Chronic anticoagulation 01/02/2013  . Dermatofibroma of forearm 01/02/2013   Left side  . Hyperlipidemia, mixed 01/04/2012  . 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 Primary hypercoagulable state ([place]) 10/01/2014  . Sinus bradycardia, chronic 01/04/2012  . Superficial thrombosis of lower extremity 05/02/2012   Social History   Socioeconomic History  . Marital status: Divorced    Spouse name: Not on file  . Number of children: 2  .  Years of education: Not on file  . Highest education level: Not on file  Occupational History  . Not on file  Social Needs  . Financial resource strain: Not on file  . Food insecurity    Worry: Not on file    Inability: Not on file  . Transportation needs    Medical: No    Non-medical: No  Tobacco Use  . Smoking status: Former Smoker    Packs/day: 0.50    Years: 30.00    Pack years: 15.00    Types: Cigarettes    Quit date: 10/19/2018    Years since quitting: 0.5  . Smokeless tobacco: Never Used  Substance and Sexual  Activity  . Alcohol use: Yes    Alcohol/week: 13.0 - 14.0 standard drinks    Types: 12 Cans of beer, 1 - 2 Shots of liquor per week    Comment: 1-2 times per week.  . Drug use: No  . Sexual activity: Not on file  Lifestyle  . Physical activity    Days per week: Not on file    Minutes per session: Not on file  . Stress: Not on file  Relationships  . Social Herbalist on phone: Not on file    Gets together: Not on file    Attends religious service: Not on file    Active member of club or organization: Not on file    Attends meetings of clubs or organizations: Not on file    Relationship status: Not on file  Other Topics Concern  . Not on file  Social History Narrative  . Not on file   Family History  Problem Relation Age of Onset  . Stroke Father     ASSESSMENT Recent Results: The most recent result is correlated with 30 mg per week: Lab Results  Component Value Date   INR 5.9 (A) 04/25/2019   INR 6.3 (A) 04/17/2019   INR 2.9 03/20/2019   PROTIME 28.8 (H) 02/04/2015    Anticoagulation Dosing: Description   OMIT ALL DOSES between now and procedure on 01-May-2019.      INR today: Supratherapeutic  PLAN Weekly dose was deferred at this time. Will OMIT ALL DOSES between today and planned portacath removal next Monday 01-May-2019. He had been instructed to discontinue commencing Thursday 27-Apr-2019, given his continued marked hypoprothrombinemic response, will stop warfarin until after portacath is removed.   Patient Instructions  Patient instructed to take medications as defined in the Anti-coagulation Track section of this encounter.  Patient instructed to OMIT today's dose.  Patient instructed to OMIT ALL DOSES between now and procedure on 01-May-2019.  Patient verbalized understanding of these instructions.    Patient advised to contact clinic or seek medical attention if signs/symptoms of bleeding or thromboembolism occur.  Patient verbalized  understanding by repeating back information and was advised to contact me if further medication-related questions arise. Patient was also provided an information handout.  Follow-up Return in 1 week (on 05/02/2019) for Follow up INR.  Pennie Banter, PharmD, CPP  15 minutes spent face-to-face with the patient during the encounter. 50% of time spent on education, including signs/sx bleeding and clotting, as well as food and drug interactions with warfarin. 50% of time was spent on fingerprick POC INR sample collection,processing, results determination, and documentation in http://www.kim.net/.

## 2019-04-25 NOTE — Progress Notes (Addendum)
INTERNAL MEDICINE TEACHING ATTENDING ADDENDUM - [name] [name] M.D  Duration- indefinite, Indication- recurrent VTE, INR- supratherapeutic. Agree with pharmacy recommendations as outlined in their note.   Patient to have right port a cath removed and will have his warfarin on hold from Thursday for procedure on Monday and then will resume anticoagulation per pharmacy instructions.

## 2019-04-25 NOTE — Patient Instructions (Signed)
Patient instructed to take medications as defined in the Anti-coagulation Track section of this encounter.  Patient instructed to OMIT today's dose.  Patient instructed to OMIT ALL DOSES between now and procedure on 01-May-2019.  Patient verbalized understanding of these instructions.

## 2019-04-26 ENCOUNTER — Other Ambulatory Visit: Payer: Self-pay | Admitting: Hematology

## 2019-04-28 ENCOUNTER — Other Ambulatory Visit: Payer: Self-pay | Admitting: Radiology

## 2019-05-01 ENCOUNTER — Encounter (HOSPITAL_COMMUNITY): Payer: Self-pay

## 2019-05-01 ENCOUNTER — Ambulatory Visit (HOSPITAL_COMMUNITY)
Admission: RE | Admit: 2019-05-01 | Discharge: 2019-05-01 | Disposition: A | Discharge status: Home / Self Care | Payer: Medicaid Other | Source: Ambulatory Visit | Attending: Hematology | Admitting: Hematology

## 2019-05-01 ENCOUNTER — Other Ambulatory Visit: Payer: Self-pay

## 2019-05-01 DIAGNOSIS — Z452 Encounter for adjustment and management of vascular access device: Secondary | ICD-10-CM | POA: Insufficient documentation

## 2019-05-01 DIAGNOSIS — C090 Malignant neoplasm of tonsillar fossa: Secondary | ICD-10-CM

## 2019-05-01 HISTORY — DX: Malignant (primary) neoplasm, unspecified: C80.1

## 2019-05-01 HISTORY — PX: IR REMOVAL TUN ACCESS W/ PORT W/O FL MOD SED: IMG2290

## 2019-05-01 LAB — CBC WITH DIFFERENTIAL/PLATELET
Abs Immature Granulocytes: 0.01 10*3/uL (ref 0.00–0.07)
Basophils Absolute: 0 10*3/uL (ref 0.0–0.1)
Basophils Relative: 0 %
Eosinophils Absolute: 0.1 10*3/uL (ref 0.0–0.5)
Eosinophils Relative: 1 %
HCT: 47.3 % (ref 39.0–52.0)
Hemoglobin: 16.5 g/dL (ref 13.0–17.0)
Immature Granulocytes: 0 %
Lymphocytes Relative: 9 %
Lymphs Abs: 0.5 10*3/uL — ABNORMAL LOW (ref 0.7–4.0)
MCH: 33.3 pg (ref 26.0–34.0)
MCHC: 34.9 g/dL (ref 30.0–36.0)
MCV: 95.6 fL (ref 80.0–100.0)
Monocytes Absolute: 0.8 10*3/uL (ref 0.1–1.0)
Monocytes Relative: 13 %
Neutro Abs: 4.5 10*3/uL (ref 1.7–7.7)
Neutrophils Relative %: 77 %
Platelets: 155 10*3/uL (ref 150–400)
RBC: 4.95 MIL/uL (ref 4.22–5.81)
RDW: 13.5 % (ref 11.5–15.5)
WBC: 5.9 10*3/uL (ref 4.0–10.5)
nRBC: 0 % (ref 0.0–0.2)

## 2019-05-01 LAB — PROTIME-INR
INR: 1 (ref 0.8–1.2)
Prothrombin Time: 13.5 seconds (ref 11.4–15.2)

## 2019-05-01 MED ORDER — LIDOCAINE HCL 1 % IJ SOLN
INTRAMUSCULAR | Status: DC | PRN
  Administered 2019-05-01: 5 mL

## 2019-05-01 MED ORDER — CEFAZOLIN SODIUM-DEXTROSE 2-4 GM/100ML-% IV SOLN: 2.0000 g | INTRAVENOUS | Status: DC

## 2019-05-01 MED ORDER — LIDOCAINE-EPINEPHRINE (PF) 2 %-1:200000 IJ SOLN
INTRAMUSCULAR | Status: AC
  Filled 2019-05-01: qty 20

## 2019-05-01 MED ORDER — CEFAZOLIN SODIUM-DEXTROSE 2-4 GM/100ML-% IV SOLN
INTRAVENOUS | Status: AC
  Filled 2019-05-01: qty 100

## 2019-05-01 MED ORDER — SODIUM CHLORIDE 0.9 % IV SOLN
INTRAVENOUS | Status: DC
  Administered 2019-05-01: 13:00:00 via INTRAVENOUS

## 2019-05-01 NOTE — Discharge Instructions (Signed)
Implanted Port Removal, Care After °This sheet gives you information about how to care for yourself after your procedure. Your health care provider may also give you more specific instructions. If you have problems or questions, contact your health care provider. °What can I expect after the procedure? °After the procedure, it is common to have: °· Soreness or pain near your incision. °· Some swelling or bruising near your incision. °Follow these instructions at home: °Medicines °· Take over-the-counter and prescription medicines only as told by your health care provider. °· If you were prescribed an antibiotic medicine, take it as told by your health care provider. Do not stop taking the antibiotic even if you start to feel better. °Bathing °· Do not take baths, swim, or use a hot tub until your health care provider approves. Ask your health care provider if you can take showers. You may only be allowed to take sponge baths. °Incision care ° °· Follow instructions from your health care provider about how to take care of your incision. Make sure you: °? Wash your hands with soap and water before you change your bandage (dressing). If soap and water are not available, use hand sanitizer. °? Change your dressing as told by your health care provider. °? Keep your dressing dry. °? Leave stitches (sutures), skin glue, or adhesive strips in place. These skin closures may need to stay in place for 2 weeks or longer. If adhesive strip edges start to loosen and curl up, you may trim the loose edges. Do not remove adhesive strips completely unless your health care provider tells you to do that. °· Check your incision area every day for signs of infection. Check for: °? More redness, swelling, or pain. °? More fluid or blood. °? Warmth. °? Pus or a bad smell. °Driving ° °· Do not drive for 24 hours if you were given a medicine to help you relax (sedative) during your procedure. °· If you did not receive a sedative, ask your  health care provider when it is safe to drive. °Activity °· Return to your normal activities as told by your health care provider. Ask your health care provider what activities are safe for you. °· Do not lift anything that is heavier than 10 lb (4.5 kg), or the limit that you are told, until your health care provider says that it is safe. °· Do not do activities that involve lifting your arms over your head. °General instructions °· Do not use any products that contain nicotine or tobacco, such as cigarettes and e-cigarettes. These can delay healing. If you need help quitting, ask your health care provider. °· Keep all follow-up visits as told by your health care provider. This is important. °Contact a health care provider if: °· You have more redness, swelling, or pain around your incision. °· You have more fluid or blood coming from your incision. °· Your incision feels warm to the touch. °· You have pus or a bad smell coming from your incision. °· You have pain that is not relieved by your pain medicine. °Get help right away if you have: °· A fever or chills. °· Chest pain. °· Difficulty breathing. °Summary °· After the procedure, it is common to have pain, soreness, swelling, or bruising near your incision. °· If you were prescribed an antibiotic medicine, take it as told by your health care provider. Do not stop taking the antibiotic even if you start to feel better. °· Do not drive for 24 hours   if you were given a sedative during your procedure. °· Return to your normal activities as told by your health care provider. Ask your health care provider what activities are safe for you. °This information is not intended to replace advice given to you by your health care provider. Make sure you discuss any questions you have with your health care provider. °Document Released: 06/17/2015 Document Revised: 08/19/2017 Document Reviewed: 08/19/2017 °Elsevier Patient Education © 2020 [employer] ° °

## 2019-05-01 NOTE — Progress Notes (Signed)
Patient ID: [name] [name], male   DOB: Sep 25, 1960, 57 y.o.   MRN: [id number] Patient presents today for Port-A-Cath removal.  He has a history of squamous cell carcinoma of the left tonsil and has completed treatment.  He is no longer using Port-A-Cath.  Details/risks of procedure, including but not limited to, internal bleeding, infection, injury to adjacent structures discussed with patient with his understanding and consent.  He does not wish to receive IV conscious sedation for the procedure.

## 2019-05-01 NOTE — Procedures (Signed)
Pre Procedural Dx: Poor venous access Post Procedural Dx: Same  Successful removal of anterior chest wall port-a-cath.  EBL: Minimal  No immediate post procedural complications.   Jay Joyclyn Plazola, MD Pager #: [phone]   

## 2019-05-05 DIAGNOSIS — Z85819 Personal history of malignant neoplasm of unspecified site of lip, oral cavity, and pharynx: Secondary | ICD-10-CM | POA: Diagnosis not present

## 2019-05-09 ENCOUNTER — Ambulatory Visit: Payer: Medicaid Other | Admitting: Physical Therapy

## 2019-05-15 ENCOUNTER — Ambulatory Visit: Payer: Medicaid Other

## 2019-05-16 ENCOUNTER — Encounter: Payer: Self-pay | Admitting: Rehabilitation

## 2019-05-16 ENCOUNTER — Other Ambulatory Visit: Payer: Self-pay

## 2019-05-16 ENCOUNTER — Ambulatory Visit: Payer: Medicaid Other | Admitting: Rehabilitation

## 2019-05-16 DIAGNOSIS — R1310 Dysphagia, unspecified: Secondary | ICD-10-CM

## 2019-05-16 DIAGNOSIS — R293 Abnormal posture: Secondary | ICD-10-CM

## 2019-05-16 DIAGNOSIS — I890 Lymphedema, not elsewhere classified: Secondary | ICD-10-CM

## 2019-05-16 NOTE — Therapy (Signed)
[place], [place], [address] Phone: [phone]   Fax:  [phone]  Physical Therapy Treatment  Patient Details  Name: [name] [name] MRN: [id number] Date of Birth: Dec 27, 1960 Referring Provider (PT): Reita May Date: 05/16/2019  PT End of Session - 05/16/19 1548    Visit Number  8    Date for PT Re-Evaluation  06/13/19    Authorization Type  okay for 4 more visits from 10/19-11/15    Authorization - Visit Number  1    Authorization - Number of Visits  4    PT Start Time  1500    PT Stop Time  1545    PT Time Calculation (min)  45 min    Activity Tolerance  Patient tolerated treatment well    Behavior During Therapy  [facility] for tasks assessed/performed       Past Medical History:  Diagnosis Date  . 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 Benign essential HTN 01/04/2012  . Cancer ([place])    tonsilar  . Chronic anticoagulation 01/02/2013  . Dermatofibroma of forearm 01/02/2013   Left side  . Hyperlipidemia, mixed 01/04/2012  . 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 Primary hypercoagulable state ([place]) 10/01/2014  . Sinus bradycardia, chronic 01/04/2012  . Superficial thrombosis of lower extremity 05/02/2012    Past Surgical History:  Procedure Laterality Date  . DIRECT LARYNGOSCOPY Left 10/19/2018   Procedure: DIRECT LARYNGOSCOPY;  Surgeon: Leta Baptist, MD;  Location: [facility];  Service: ENT;  Laterality: Left;  . IR IMAGING GUIDED PORT INSERTION  11/04/2018  . IR REMOVAL TUN ACCESS W/ PORT W/O FL MOD SED  05/01/2019  . TONSILLECTOMY Left 10/19/2018   Procedure: BIOPSY OF LEFT TONSIL;  Surgeon: Leta Baptist, MD;  Location: [facility];  Service: ENT;  Laterality: Left;    There were no vitals filed  for this visit.  Subjective Assessment - 05/16/19 1500    Subjective  No changes.  I should be getting a pump.  I got measured by have not received it yet.  She said 4-5 weeks.    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  Yes    Pain Score  2     Pain Location  Throat    Pain Orientation  Left    Pain Descriptors / Indicators  Tightness;Aching    Pain Type  Surgical pain    Pain Onset  More than a month ago    Pain Frequency  Constant            LYMPHEDEMA/ONCOLOGY QUESTIONNAIRE - 05/16/19 1504      Head and Neck   4 cm superior to sternal notch around neck  50 cm    6 cm superior to sternal notch around neck  49.3 cm    8 cm superior to sternal notch around neck  49.5 cm    Other  50.2                [facility] Adult PT Treatment/Exercise - 05/16/19 0001      Manual Therapy   Edema Management  cicumferential measurements  taken    Manual Lymphatic Drainage (MLD)  in supine head of bed elevated: 5 diaphragmatic breaths, short neck, bilateral axillary nodes, bilateral pectoral nodes, posterior, lateral and anterior neck moving fluid towards pathways                   PT Long Term Goals - 05/16/19 1555      PT LONG TERM GOAL #1   Title  Pt will be able to independently manage lymphedema through self MLD and compression garments    Status  Partially Met      PT LONG TERM GOAL #2   Title  Pt will receive appropriate compression garments for long term management of lymphedema    Status  Partially Met      PT LONG TERM GOAL #3   Title  Pt will reduce swelling at 8 cm superior to sternal notch by 2 cm to decrease risk of swelling.    Status  Achieved            Plan - 05/16/19 1549    Clinical Impression Statement  Pt returns around 3 weeks after last PT visit so circumferential measurements were  taken.  Decrease around 2cm 6 and 8cm above sternal notch but slight increase at 2 and 4 cm.  Continues with puffiness and edema left supraclavicular region and anterior neck.  Pt reports that he is doing self MLD at home and reports no questions but seems to have only a mild understanding of technique overall.  Pt should be getting a flexitouch and garment soon.    PT Frequency  1x / week    PT Duration  4 weeks    PT Treatment/Interventions  ADLs/Self Care Home Management;Therapeutic exercise;Manual lymph drainage;Manual techniques;Compression bandaging;Taping    PT Next Visit Plan  MLD to head and neck and instruct pt per handout issued 9/23. continue to work on fullness in neck and supraclavicular area.  Reinforce neck ROM  possibly work on balance once lymphedema is managed    Consulted and Agree with Plan of Care  Patient       Patient will benefit from skilled therapeutic intervention in order to improve the following deficits and impairments:     Visit Diagnosis: Lymphedema, not elsewhere classified  Abnormal posture  Dysphagia, unspecified type     Problem List Patient Active Problem List   Diagnosis Date Noted  . Warfarin anticoagulation 02/20/2019  . Hypercholesteremia 02/07/2019  . Recurrent deep vein thrombosis (DVT) ([place]) 02/06/2019  . Thoracic ascending aortic aneurysm ([place]) 02/06/2019  . Port-A-Cath in place 01/18/2019  . Mucositis due to chemotherapy 12/07/2018  . Hypomagnesemia 12/07/2018  . Thrush 12/07/2018  . Malignant neoplasm of tonsillar fossa ([place]) 10/17/2018  . 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 DVT, lower extremity, proximal ([place]) 05/24/2013  . Chronic anticoagulation 01/02/2013  . Superficial thrombosis of lower extremity 05/02/2012  . Benign essential HTN 01/04/2012  . Hyperlipidemia, mixed 01/04/2012    [name] [name] 05/16/2019, 3:56 PM  [place], [place], [address] Phone: [phone]   Fax:  [phone]  Name: [name] [name] MRN: [id number] Date of Birth: 02/15/1961

## 2019-05-20 ENCOUNTER — Other Ambulatory Visit: Payer: Self-pay | Admitting: Pharmacist

## 2019-05-22 ENCOUNTER — Telehealth: Payer: Self-pay | Admitting: Pharmacist

## 2019-05-22 ENCOUNTER — Other Ambulatory Visit: Payer: Self-pay

## 2019-05-22 ENCOUNTER — Ambulatory Visit (INDEPENDENT_AMBULATORY_CARE_PROVIDER_SITE_OTHER): Payer: Medicaid Other | Admitting: Pharmacist

## 2019-05-22 DIAGNOSIS — Z86718 Personal history of other venous thrombosis and embolism: Secondary | ICD-10-CM

## 2019-05-22 DIAGNOSIS — Z7901 Long term (current) use of anticoagulants: Secondary | ICD-10-CM | POA: Diagnosis not present

## 2019-05-22 DIAGNOSIS — Z5181 Encounter for therapeutic drug level monitoring: Secondary | ICD-10-CM | POA: Diagnosis not present

## 2019-05-22 DIAGNOSIS — Z86711 Personal history of pulmonary embolism: Secondary | ICD-10-CM

## 2019-05-22 LAB — PROTIME-INR
INR: 7.4 (ref 0.8–1.2)
Prothrombin Time: 61.7 seconds — ABNORMAL HIGH (ref 11.4–15.2)

## 2019-05-22 LAB — POCT INR: INR: 7.4 — AB (ref 2.0–3.0)

## 2019-05-22 NOTE — Patient Instructions (Signed)
Patient instructed to take medications as defined in the Anti-coagulation Track section of this encounter.  Patient instructed to OMIT today's dose and tomorrow's dose.  Patient instructed to HOLD/OMIT doses on Monday 22-May-2019 and Tuesday 23-May-2019. Recommence warfarin on Wednesday 24-May-2019, alternating 1/2 tablet with 1 tablet every-other-day. Return to outpatient clinic on Monday 29-May-2019 for repeat INR. Patient verbalized understanding of these instructions.

## 2019-05-22 NOTE — Progress Notes (Signed)
Anticoagulation Management [name] [name] is a 58 y.o. male who reports to the clinic for monitoring of warfarin treatment.    Indication: DVT, History; Long term current use of anticoagulant.   Duration: indefinite Supervising physician: Velna Ochs, MD  Anticoagulation Clinic Visit History: Patient does not report signs/symptoms of bleeding or thromboembolism  Other recent changes: YES, See medication changes documented in patient findings. Anticoagulation Episode Summary    Current INR goal:  2.5-3.5  TTR:  33.1 % (8 y)  Next INR check:  05/29/2019  INR from last check:  7.4 (05/22/2019)  Weekly max warfarin dose:    Target end date:    INR check location:    Preferred lab:    Send INR reminders to:  [facility]   Indications   Pulmonary embolism ([place]) (Resolved) [I26.99]       Comments:        Anticoagulation Care Providers    Provider Role Specialty Phone number   Annia Belt, MD Referring Oncology [phone]      No Known Allergies  Current Outpatient Medications:  .  amLODipine (NORVASC) 10 MG tablet, Take 1 tablet (10 mg total) by mouth daily., Disp: 90 tablet, Rfl: 2 .  atorvastatin (LIPITOR) 20 MG tablet, Take 1 tablet (20 mg total) by mouth daily., Disp: 30 tablet, Rfl: 11 .  ibuprofen (ADVIL) 200 MG tablet, Take 600 mg by mouth every 6 (six) hours as needed for pain or fever. , Disp: , Rfl:  .  nystatin (MYCOSTATIN) 100000 UNIT/ML suspension, Take 5 mLs (500,000 Units total) by mouth 4 (four) times daily. Swish for 60 sec, then swallow. Continue QID until you use up bottle., Disp: 473 mL, Rfl: 0 .  sodium fluoride (PREVIDENT 5000 PLUS) 1.1 % CREA dental cream, Apply to tooth brush. Brush teeth for 2 minutes. Spit out excess. DO NOT rinse afterwards. Repeat nightly., Disp: 1 Tube, Rfl: prn .  triamcinolone lotion (KENALOG) 0.1 %, Apply to affected area twice a day, Disp: 60 mL, Rfl: 0 .  warfarin (COUMADIN) 10 MG tablet, Take 1 tablet of 10 mg  warfarin daily except on Mondays, Wednesdays and Fridays, take only 1/2 tablet (5 mg)., Disp: 24 tablet, Rfl: 2 No current facility-administered medications for this visit.   Facility-Administered Medications Ordered in Other Visits:  .  heparin lock flush 100 unit/mL, 500 Units, Intravenous, Once, Tish Men, MD .  sodium chloride flush (NS) 0.9 % injection 10 mL, 10 mL, Intravenous, PRN, Tish Men, MD Past Medical History:  Diagnosis Date  . 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 Benign essential HTN 01/04/2012  . 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 Chronic anticoagulation 01/02/2013  . Dermatofibroma of forearm 01/02/2013   Left side  . Hyperlipidemia, mixed 01/04/2012  . 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 Primary hypercoagulable state ([place]) 10/01/2014  . Sinus bradycardia, chronic 01/04/2012  . Superficial thrombosis of lower extremity 05/02/2012   Social History   Socioeconomic History  . Marital status: Divorced    Spouse name: Not on file  . Number of children: 2  . Years of education: Not on file  . Highest education level: Not on file  Occupational History  . Not on file  Social Needs  . Financial resource strain: Not on file  . Food insecurity  Worry: Not on file    Inability: Not on file  . Transportation needs    Medical: No    Non-medical: No  Tobacco Use  . Smoking status: Former Smoker    Packs/day: 0.50    Years: 30.00    Pack years: 15.00    Types: Cigarettes    Quit date: 10/19/2018    Years since quitting: 0.5  . Smokeless tobacco: Never Used  Substance and Sexual Activity  . Alcohol use: Yes    Alcohol/week: 13.0 - 14.0 standard drinks    Types: 12 Cans of beer, 1 - 2 Shots of liquor per week    Comment: 1-2 times per week.  . Drug use: No  . Sexual activity: Not on file   Lifestyle  . Physical activity    Days per week: Not on file    Minutes per session: Not on file  . Stress: Not on file  Relationships  . Social Herbalist on phone: Not on file    Gets together: Not on file    Attends religious service: Not on file    Active member of club or organization: Not on file    Attends meetings of clubs or organizations: Not on file    Relationship status: Not on file  Other Topics Concern  . Not on file  Social History Narrative  . Not on file   Family History  Problem Relation Age of Onset  . Stroke Father     ASSESSMENT Recent Results: The most recent result is correlated with 55 mg per week after resuming his warfarin after his procedure was performed [facility]): Lab Results  Component Value Date   INR 7.4 (A) 05/22/2019   INR 7.4 (HH) 05/22/2019   INR 1.0 05/01/2019   PROTIME 28.8 (H) 02/04/2015    Anticoagulation Dosing: Description   HOLD/OMIT doses on Monday 22-May-2019 and Tuesday 23-May-2019. Recommence warfarin on Wednesday 24-May-2019, alternating 1/2 tablet with 1 tablet every-other-day. Return to outpatient clinic on Monday 29-May-2019 for repeat INR.      INR today: Supratherapeutic  PLAN Weekly dose was decreased by omitting two doses, then resuming with 5mg  alternating with 10mg  every-other-day with repeat INR in one week.   Patient Instructions  Patient instructed to take medications as defined in the Anti-coagulation Track section of this encounter.  Patient instructed to OMIT today's dose and tomorrow's dose.  Patient instructed to HOLD/OMIT doses on Monday 22-May-2019 and Tuesday 23-May-2019. Recommence warfarin on Wednesday 24-May-2019, alternating 1/2 tablet with 1 tablet every-other-day. Return to outpatient clinic on Monday 29-May-2019 for repeat INR. Patient verbalized understanding of these instructions.   Patient advised to contact clinic or seek medical attention if signs/symptoms of bleeding or  thromboembolism occur.  Patient verbalized understanding by repeating back information and was advised to contact me if further medication-related questions arise. Patient was also provided an information handout.  Follow-up Return in 1 week (on 05/29/2019) for Follow up INR.  Dorene Grebe Groce  15 minutes spent face-to-face with the patient during the encounter. 50% of time spent on education, including signs/sx bleeding and clotting, as well as food and drug interactions with warfarin. 50% of time was spent on fingerprick POC INR sample collection,processing, results determination, and documentation in http://www.kim.net/.

## 2019-05-22 NOTE — Telephone Encounter (Signed)
Patient was left message to OMIT warfarin x 2 days (22-May-2019 and 23-May-2019) and recommence on Wednesday 25-May-2019 by taking 1/2x10mg  (5mg  dose) on Wed/Friday/Sun; take 1x10mg  (10mg  dose) on Th/Sa. RTC on 29-May-2019.

## 2019-05-23 ENCOUNTER — Encounter: Payer: Self-pay | Admitting: Physical Therapy

## 2019-05-23 ENCOUNTER — Ambulatory Visit: Payer: Medicaid Other | Attending: Radiation Oncology | Admitting: Physical Therapy

## 2019-05-23 DIAGNOSIS — I890 Lymphedema, not elsewhere classified: Secondary | ICD-10-CM

## 2019-05-23 DIAGNOSIS — R1310 Dysphagia, unspecified: Secondary | ICD-10-CM | POA: Diagnosis not present

## 2019-05-23 DIAGNOSIS — R293 Abnormal posture: Secondary | ICD-10-CM | POA: Insufficient documentation

## 2019-05-23 NOTE — Therapy (Signed)
[place], [place], [address] Phone: [phone]   Fax:  [phone]  Physical Therapy Treatment  Patient Details  Name: [name] [name] MRN: [id number] Date of Birth: October 11, 1960 Referring Provider (PT): Reita May Date: 05/23/2019  PT End of Session - 05/23/19 1654    Visit Number  6    Number of Visits  8    Date for PT Re-Evaluation  06/13/19    Authorization Type  okay for 4 more visits from 10/19-11/15    Authorization - Visit Number  2    Authorization - Number of Visits  4    PT Start Time  1601    PT Stop Time  0017    PT Time Calculation (min)  46 min    Activity Tolerance  Patient tolerated treatment well    Behavior During Therapy  [facility] for tasks assessed/performed       Past Medical History:  Diagnosis Date  . 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 Benign essential HTN 01/04/2012  . 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 Chronic anticoagulation 01/02/2013  . Dermatofibroma of forearm 01/02/2013   Left side  . Hyperlipidemia, mixed 01/04/2012  . 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 Primary hypercoagulable state ([place]) 10/01/2014  . Sinus bradycardia, chronic 01/04/2012  . Superficial thrombosis of lower extremity 05/02/2012    Past Surgical History:  Procedure Laterality Date  . DIRECT LARYNGOSCOPY Left 10/19/2018   Procedure: DIRECT LARYNGOSCOPY;  Surgeon: Leta Baptist, MD;  Location: [facility];  Service: ENT;  Laterality: Left;  . IR IMAGING GUIDED PORT INSERTION  11/04/2018  . IR REMOVAL TUN ACCESS W/ PORT W/O FL MOD SED  05/01/2019  . TONSILLECTOMY Left 10/19/2018   Procedure: BIOPSY OF LEFT TONSIL;  Surgeon: Leta Baptist, MD;  Location: [facility];  Service: ENT;  Laterality: Left;     There were no vitals filed for this visit.  Subjective Assessment - 05/23/19 1604    Subjective  My swelling is doing okay. I have been doing the massage a little bit.    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  Yes    Pain Score  2     Pain Location  Throat    Pain Orientation  Left    Pain Descriptors / Indicators  Aching;Tightness    Pain Type  Surgical pain    Pain Onset  More than a month ago    Pain Frequency  Constant            LYMPHEDEMA/ONCOLOGY QUESTIONNAIRE - 05/23/19 1606      Head and Neck   4 cm superior to sternal notch around neck  46.9 cm    6 cm superior to sternal notch around neck  46.7 cm    8 cm superior to sternal notch around neck  48.6 cm    Other  49                [facility] Adult PT Treatment/Exercise - 05/23/19 0001  Manual Therapy   Edema Management  cicumferential measurements taken    Manual Lymphatic Drainage (MLD)  in supine head of bed elevated: 5 diaphragmatic breaths, short neck, bilateral axillary nodes, bilateral pectoral nodes, posterior, lateral and anterior neck moving fluid towards pathways                   PT Long Term Goals - 05/16/19 1555      PT LONG TERM GOAL #1   Title  Pt will be able to independently manage lymphedema through self MLD and compression garments    Status  Partially Met      PT LONG TERM GOAL #2   Title  Pt will receive appropriate compression garments for long term management of lymphedema    Status  Partially Met      PT LONG TERM GOAL #3   Title  Pt will reduce swelling at 8 cm superior to sternal notch by 2 cm to decrease risk of swelling.    Status  Achieved            Plan - 05/23/19 1655    Clinical Impression Statement  Circumferential measurements taken  today. Pt has been very compliant and does self MLD several times a day and his wife does it for him at least once a day. Pt wears his chip pack for 8 hours a day. Despite all of this his swelling still remains and he has been in PT for over 4 weeks. He would benefit from a compression pump for this reason.    PT Frequency  1x / week   4 visits per Josem Kaufmann   PT Duration  4 weeks    PT Treatment/Interventions  ADLs/Self Care Home Management;Therapeutic exercise;Manual lymph drainage;Manual techniques;Compression bandaging;Taping    PT Next Visit Plan  MLD to head and neck and instruct pt per handout issued 9/23. continue to work on fullness in neck and supraclavicular area.  Reinforce neck ROM  possibly work on balance once lymphedema is managed    PT Home Exercise Plan  wear chip pack    Consulted and Agree with Plan of Care  Patient       Patient will benefit from skilled therapeutic intervention in order to improve the following deficits and impairments:  Postural dysfunction, Decreased strength, Increased edema, Decreased knowledge of precautions  Visit Diagnosis: Lymphedema, not elsewhere classified     Problem List Patient Active Problem List   Diagnosis Date Noted  . Warfarin anticoagulation 02/20/2019  . Hypercholesteremia 02/07/2019  . 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 Thoracic ascending aortic aneurysm ([place]) 02/06/2019  . Port-A-Cath in place 01/18/2019  . Mucositis due to chemotherapy 12/07/2018  . Hypomagnesemia 12/07/2018  . Thrush 12/07/2018  . Malignant neoplasm of tonsillar fossa ([place]) 10/17/2018  . 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 DVT, lower extremity, proximal ([place]) 05/24/2013  . Chronic anticoagulation 01/02/2013  . Superficial thrombosis of lower extremity 05/02/2012  . Benign essential HTN 01/04/2012  . Hyperlipidemia, mixed 01/04/2012    Allyson Sabal [facility] 05/23/2019, 4:59 PM  [place], [place], [address] Phone: [phone]   Fax:  [phone]  Name: [name] [name] MRN: [id number] Date of Birth: 04/27/1961  Manus Gunning, PT 05/23/19 4:59 PM

## 2019-05-23 NOTE — Progress Notes (Signed)
INTERNAL MEDICINE ATTENDING ADDENDUM  Patient on life long anticoagulation for recurrent VTE. INR today is supratherapeutic. Agree with pharmacy's recommendations to reduce dose as outlined in their note.

## 2019-05-24 ENCOUNTER — Encounter: Payer: Self-pay | Admitting: Hematology

## 2019-05-24 ENCOUNTER — Inpatient Hospital Stay: Payer: Medicaid Other

## 2019-05-24 ENCOUNTER — Inpatient Hospital Stay: Payer: Medicaid Other | Attending: Hematology | Admitting: Hematology

## 2019-05-24 ENCOUNTER — Other Ambulatory Visit: Payer: Self-pay

## 2019-05-24 VITALS — BP 125/85 | HR 111 | Temp 98.7°F | Resp 18 | Ht 75.0 in | Wt 244.3 lb

## 2019-05-24 DIAGNOSIS — B370 Candidal stomatitis: Secondary | ICD-10-CM | POA: Diagnosis not present

## 2019-05-24 DIAGNOSIS — Z7901 Long term (current) use of anticoagulants: Secondary | ICD-10-CM | POA: Diagnosis not present

## 2019-05-24 DIAGNOSIS — I825Y1 Chronic embolism and thrombosis of unspecified deep veins of right proximal lower extremity: Secondary | ICD-10-CM | POA: Diagnosis not present

## 2019-05-24 DIAGNOSIS — C090 Malignant neoplasm of tonsillar fossa: Secondary | ICD-10-CM | POA: Diagnosis not present

## 2019-05-24 DIAGNOSIS — Z79899 Other long term (current) drug therapy: Secondary | ICD-10-CM | POA: Diagnosis not present

## 2019-05-24 LAB — CMP (CANCER CENTER ONLY)
ALT: 21 U/L (ref 0–44)
AST: 19 U/L (ref 15–41)
Albumin: 4 g/dL (ref 3.5–5.0)
Alkaline Phosphatase: 97 U/L (ref 38–126)
Anion gap: 13 (ref 5–15)
BUN: 11 mg/dL (ref 6–20)
CO2: 26 mmol/L (ref 22–32)
Calcium: 9.8 mg/dL (ref 8.9–10.3)
Chloride: 100 mmol/L (ref 98–111)
Creatinine: 0.8 mg/dL (ref 0.61–1.24)
GFR, Est AFR Am: >60 mL/min (ref >60)
GFR, Estimated: >60 mL/min (ref >60)
Glucose, Bld: 100 mg/dL — ABNORMAL HIGH (ref 70–99)
Potassium: 4.1 mmol/L (ref 3.5–5.1)
Sodium: 139 mmol/L (ref 135–145)
Total Bilirubin: 0.5 mg/dL (ref 0.3–1.2)
Total Protein: 7.2 g/dL (ref 6.5–8.1)

## 2019-05-24 LAB — CBC WITH DIFFERENTIAL (CANCER CENTER ONLY)
Abs Immature Granulocytes: 0.01 10*3/uL (ref 0.00–0.07)
Basophils Absolute: 0 10*3/uL (ref 0.0–0.1)
Basophils Relative: 1 %
Eosinophils Absolute: 0.1 10*3/uL (ref 0.0–0.5)
Eosinophils Relative: 2 %
HCT: 46.5 % (ref 39.0–52.0)
Hemoglobin: 16.4 g/dL (ref 13.0–17.0)
Immature Granulocytes: 0 %
Lymphocytes Relative: 14 %
Lymphs Abs: 0.6 10*3/uL — ABNORMAL LOW (ref 0.7–4.0)
MCH: 32.6 pg (ref 26.0–34.0)
MCHC: 35.3 g/dL (ref 30.0–36.0)
MCV: 92.4 fL (ref 80.0–100.0)
Monocytes Absolute: 0.6 10*3/uL (ref 0.1–1.0)
Monocytes Relative: 14 %
Neutro Abs: 3 10*3/uL (ref 1.7–7.7)
Neutrophils Relative %: 69 %
Platelet Count: 187 10*3/uL (ref 150–400)
RBC: 5.03 MIL/uL (ref 4.22–5.81)
RDW: 13.8 % (ref 11.5–15.5)
WBC Count: 4.3 10*3/uL (ref 4.0–10.5)
nRBC: 0 % (ref 0.0–0.2)

## 2019-05-24 LAB — T4, FREE: Free T4: 0.76 ng/dL (ref 0.61–1.12)

## 2019-05-24 MED ORDER — CLOTRIMAZOLE 10 MG MT TROC: 10.0000 mg | Freq: Every day | OROMUCOSAL | 0 refills | Status: AC

## 2019-05-24 NOTE — Progress Notes (Addendum)
[place] OFFICE PROGRESS NOTE  Patient Care Team: [name], MD as PCP - General (Internal Medicine) Annia Belt, MD as Consulting Physician (Oncology) Leta Baptist, MD as Consulting Physician (Otolaryngology) Eppie Gibson, MD as Attending Physician (Radiation Oncology) Tish Men, MD as Consulting Physician (Hematology) Leota Sauers, RN as Oncology Nurse Navigator  HEME/ONC OVERVIEW: 1. 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 Melene Plan, invasive SCCa, p16+; not a candidate for TORS -Late 10/2018 - 12/2018: definitive chemoradiation with weekly cisplatin  -03/2019: end-of-treatment PET showed decrease in FDG avidity in the left tonsil but some residual soft tissue fullness, resolution of left LN disease    2. 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 -12/2018: acute DVT involving R peroneal vein and L gastrocnemius vein; new arterial thrombosis involving R common femoral artery and popliteal arteries, due to artery aneurysm -Late 12/2018: progression of acute DVT in the LLE from gastrocnemius vein to the femoral, popliteal, posterior tibial and peroneal veins despite being on Eliquis  -Currently on warfarin   TREATMENT REGIMEN:  11/08/2018 - 12/29/2018: definitive chemoradiation with weekly cisplatin x 6  Anticoagulation:  Early 2000 - 12/2018: warfarin, non-compliant   12/2018: Eliquis 5mg  BID x ~4 weeks with clot progression  Late 12/2018 - present: warfarin  PERTINENT NON-HEM/ONC PROBLEMS: 1. Tobacco and EtOH abuse 2. Paroxysmal A-fib 3. 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  Left-sided cervical LN disease has resolved.  There was no evidence of distant metastatic disease.  Follow-up CT neck in 06/2019 showed new diffuse pharyngeal swelling, most prominent in the region of the previous left tonsillar malignancy. There was no pathologically enlarged LN's.  -While the pharyngeal swelling may be residual inflammation from radiation, recurrent disease could not be ruled out.  I have reached out to Dr. Benjamine Mola to schedule a fiberoptic exam ASAP and determine if biopsy is indicated.  -Given the new pharyngeal swelling, I will order CT neck in 3 months for follow-up (ie 09/2019) -I emphasized the importance of ongoing ENT surveillance for any recurrent disease  Recurrent LLE DVT -Currently on warfarin -Patient apparently had taken fluconazole for oral candidiasis, which caused his INR to rise significantly  -No evidence of lower extremity swelling on exam today  -Continue follow-up with anticoagulation clinic  -Goal of anticoagulation is lifelong   Persistent oral candidiasis -No improvement despite recent fluconazole -Due to the patient being on warfarin, the anti-fungal options are limited -I have prescribed clotrimazole troches, 5x/day, for 10 days -I also counseled the patient on the importance of maintaining oral hygiene, including mouth rinse and tongue brushing  -In addition, due to the refractory oral candidiasis, I have referred the patient to infectious disease for further management    Orders Placed This  Encounter  Procedures  . PET, restage (skull base  to thigh)    Standing Status:   Future    Standing Expiration Date:   11/20/2020    Scheduling Instructions:     Pls schedule prior to clinic appt on 06/21/2019    Order Specific Question:   If indicated for the ordered procedure, I authorize the administration of a radiopharmaceutical per Radiology protocol    Answer:   Yes    Order Specific Question:   Preferred imaging location?    Answer:   [facility]    Order Specific Question:   Radiology Contrast Protocol - do NOT remove file path    Answer:   [email or web address]  . CBC w/ diff    Standing Status:   Future    Standing Expiration Date:   06/27/2020  . CMP    Standing Status:   Future    Standing Expiration Date:   06/27/2020  . TSH    Standing Status:   Future    Standing Expiration Date:   05/23/2020  . Ambulatory referral to Infectious Disease    Referral Priority:   Urgent    Referral Type:   Consultation    Referral Reason:   Specialty Services Required    Requested Specialty:   Infectious Diseases    Number of Visits Requested:   1   All questions were answered. The patient knows to call the clinic with any problems, questions or concerns. No barriers to learning was detected.  Return in 4 weeks for labs and clinic appt.   Tish Men, MD 05/24/2019 4:05 PM  CHIEF COMPLAINT: "My tongue is still white"  INTERVAL HISTORY: Mr. Warwick returns to clinic for follow-up of squamous cell carcinoma on the left tonsil s/p definitive chemoradiation.  The patient reports that he has had persistent white plaques under the tongue for at least the past 2 months.  He recently saw Dr. Benjamine Mola of ENT for disease surveillance, who reportedly performed fiberoptic exam that did not show any evidence of disease recurrence.  He was also prescribed 10 days of fluconazole, but it did not lead to any improvement.  However, he forgot to mention the fluconazole to his oral pharmacist, who manages his warfarin, and as a result  his INR rose to 7.7.  He is scheduled for repeat INR next Monday.  He denies any abnormal bleeding or excess bruising.  He still has some soreness with swallowing, but he is able to eat and drink without any limitation.  His weight has been stable.  He denies any other complaint today.  REVIEW OF SYSTEMS:   Constitutional: ( - ) fevers, ( - )  chills , ( - ) night sweats Eyes: ( - ) blurriness of vision, ( - ) double vision, ( - ) watery eyes Ears, nose, mouth, throat, and face: ( - ) mucositis, ( + ) sore throat Respiratory: ( - ) cough, ( - ) dyspnea, ( - ) wheezes Cardiovascular: ( - ) palpitation, ( - ) chest discomfort, ( - ) lower extremity swelling Gastrointestinal:  ( - ) nausea, ( - ) heartburn, ( - ) change in bowel habits Skin: ( - ) abnormal skin rashes Lymphatics: ( - ) new lymphadenopathy, ( - ) easy bruising Neurological: ( - ) numbness, ( - ) tingling, ( - ) new weaknesses Behavioral/Psych: ( - ) mood change, ( - ) new changes  All other systems were reviewed with the patient and are  negative.  SUMMARY OF ONCOLOGIC HISTORY: Oncology History  Malignant neoplasm of tonsillar fossa ([place])  10/10/2018 Imaging   CT neck w/ contrast: FINDINGS: Pharynx and larynx: There is asymmetric prominence of the left tonsillar region. No other mucosal or submucosal lesion is seen.  Salivary glands: Parotid and submandibular glands are normal.  Thyroid: Normal  Lymph nodes: Definite adenopathy on the left. 2 level nodes which shows some internal necrosis. The larger measures 2.2 cm in diameter and the smaller measures 1.4 cm in diameter. Suspicious level 4 node image 83 just anterior to the jugular vein measuring 9 mm short axis diameter. No worrisome nodes on the right.   10/10/2018 Imaging   CT chest:  IMPRESSION: 1. Mild right hilar adenopathy, equivocal for metastasis. No additional potential evidence of metastatic disease in the chest. PET-CT could be considered for further  staging evaluation, as clinically warranted. 2. 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 Ascending thoracic aortic aneurysm. Recommend semi-annual imaging followup by CTA or MRA and referral to cardiothoracic surgery if not already obtained. This recommendation follows 2010 ACCF/AHA/AATS/ACR/ASA/SCA/SCAI/SIR/STS/SVM Guidelines for the Diagnosis and Management of Patients With Thoracic Aortic Disease. Circulation. 2010; [id number]. Aortic aneurysm NOS (ICD10-I71.9).   10/17/2018 Initial Diagnosis   Tonsil cancer ([place])   10/18/2018 Imaging   PET: IMPRESSION: 1. 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 2. Local hypermetabolic nodal metastasis to the LEFT level II nodal station. 3. No contralateral nodal metastasis. 4. No evidence distant metastatic disease. 5. Two small pulmonary nodules in the LEFT lower lobe are favored benign. Recommend attention on follow-up. 6. Several small common iliac lymph nodes with low metabolic activity. Favor reactive adenopathy.   10/21/2018 Cancer Staging   Staging form: Pharynx - HPV-Mediated Oropharynx, AJCC 8th Edition - Clinical: Stage I (cT2, cN1, cM0, p16+) - Signed by Eppie Gibson, MD on 10/21/2018   11/10/2018 -  Chemotherapy   The patient had palonosetron (ALOXI) injection 0.25 mg, 0.25 mg, Intravenous,  Once, 6 of 7 cycles Administration: 0.25 mg (11/10/2018), 0.25 mg (11/17/2018), 0.25 mg (12/22/2018), 0.25 mg (11/24/2018), 0.25 mg (12/15/2018), 0.25 mg (12/29/2018) CISplatin (PLATINOL) 100 mg in sodium chloride 0.9 % 500 mL chemo infusion, 40 mg/m2 = 100 mg, Intravenous,  Once, 6 of 7 cycles Dose modification: 30 mg/m2 (original dose 40 mg/m2, Cycle 5, Reason: Dose not tolerated) Administration: 100 mg (11/10/2018), 100 mg (11/17/2018), 75 mg (12/22/2018), 100 mg (11/24/2018), 75 mg (12/15/2018), 75 mg (12/29/2018) fosaprepitant (EMEND) 150 mg, dexamethasone (DECADRON) 12 mg in sodium chloride 0.9 % 145 mL IVPB, ,  Intravenous,  Once, 6 of 7 cycles Administration:  (11/10/2018),  (11/17/2018),  (12/22/2018),  (11/24/2018),  (12/15/2018),  (12/29/2018)  for chemotherapy treatment.    04/10/2019 Imaging   PET (end-of-treatment) IMPRESSION: 1. Interval decrease in hypermetabolism associated with the left tonsillar region. CT imaging does demonstrate some residual soft tissue fullness in this region. 2. Interval resolution of left-sided level II hypermetabolic nodal metastases. 3. No evidence for hypermetabolic metastatic disease in the chest, abdomen, or pelvis. 4. 2 tiny left lower lobe pulmonary nodules were identified on the previous study. One of these nodules is stable and the other has resolved. Continued attention on follow-up suggested. 5. The small bilateral common iliac nodes seen previously are unchanged, likely reactive 6.  Aortic Atherosclerois (ICD10-170.0) 7.  Emphysema. [id number])     I have reviewed the past medical history, past surgical history, social history and family history with the patient and they are unchanged from previous note.  ALLERGIES:  has No Known Allergies.  MEDICATIONS:  Current Outpatient Medications  Medication Sig Dispense Refill  . amLODipine (NORVASC) 10 MG tablet Take 1 tablet (10 mg total) by mouth daily. 90 tablet 2  . atorvastatin (LIPITOR) 20 MG tablet Take 1 tablet (20 mg total) by mouth daily. (Patient taking differently: Take 20 mg by mouth as needed. Few times a week per pt.) 30 tablet 11  . ibuprofen (ADVIL) 200 MG tablet Take 600 mg by mouth every 6 (six) hours as needed for pain or fever.     . sodium fluoride (PREVIDENT 5000 PLUS) 1.1 % CREA dental cream Apply to tooth brush. Brush teeth for 2 minutes. Spit out excess. DO NOT rinse afterwards. Repeat nightly. 1 Tube prn  . warfarin (COUMADIN) 10 MG tablet Take 1 tablet of 10 mg warfarin daily except on Mondays, Wednesdays and Fridays, take only 1/2 tablet (5 mg). 24 tablet 2  . clotrimazole  (MYCELEX) 10 MG troche Take 1 tablet (10 mg total) by mouth 5 (five) times daily for 14 days. 70 tablet 0   No current facility-administered medications for this visit.    Facility-Administered Medications Ordered in Other Visits  Medication Dose Route Frequency Provider Last Rate Last Dose  . heparin lock flush 100 unit/mL  500 Units Intravenous Once Tish Men, MD      . sodium chloride flush (NS) 0.9 % injection 10 mL  10 mL Intravenous PRN Tish Men, MD        PHYSICAL EXAMINATION: ECOG PERFORMANCE STATUS: 1 - Symptomatic but completely ambulatory  Today's Vitals   05/24/19 1506 05/24/19 1520  BP: 125/85   Pulse: (!) 111   Resp: 18   Temp: 98.7 F (37.1 C)   TempSrc: Temporal   SpO2: 96%   Weight: 244 lb 4.8 oz (110.8 kg)   Height: 6\' 3"  (1.905 m)   PainSc:  2    Body mass index is 30.54 kg/m.  Filed Weights   05/24/19 1506  Weight: 244 lb 4.8 oz (110.8 kg)    GENERAL: alert, no distress and comfortable SKIN: skin color, texture, turgor are normal, no rashes or significant lesions EYES: conjunctiva are pink and non-injected, sclera clear OROPHARYNX: extensive white plaques over the tongue, no definite ulceration  NECK: supple, non-tender LYMPH:  no palpable lymphadenopathy in the cervical LUNGS: clear to auscultation with normal breathing effort HEART: regular rate & rhythm and no murmurs and no lower extremity edema ABDOMEN: soft, non-tender, non-distended, normal bowel sounds Musculoskeletal: no cyanosis of digits and no clubbing  PSYCH: alert & oriented x 3, fluent speech  LABORATORY DATA:  I have reviewed the data as listed    Component Value Date/Time   NA 139 05/24/2019 1452   NA 142 10/03/2018 1450   NA 141 05/21/2014 1226   K 4.1 05/24/2019 1452   K 4.3 05/21/2014 1226   CL 100 05/24/2019 1452   CL 108 (H) 12/28/2012 1405   CO2 26 05/24/2019 1452   CO2 28 05/21/2014 1226   GLUCOSE 100 (H) 05/24/2019 1452   GLUCOSE 101 05/21/2014 1226   GLUCOSE 95  12/28/2012 1405   BUN 11 05/24/2019 1452   BUN 15 10/03/2018 1450   BUN 13.2 05/21/2014 1226   CREATININE 0.80 05/24/2019 1452   CREATININE 1.00 09/24/2014 1413   CREATININE 0.9 05/21/2014 1226   CALCIUM 9.8 05/24/2019 1452   CALCIUM 9.3 05/21/2014 1226   PROT 7.2 05/24/2019 1452   PROT 7.2 10/03/2018 1450  PROT 7.1 01/08/2014 1203   ALBUMIN 4.0 05/24/2019 1452   ALBUMIN 4.3 10/03/2018 1450   ALBUMIN 3.7 01/08/2014 1203   AST 19 05/24/2019 1452   AST 50 (H) 01/08/2014 1203   ALT 21 05/24/2019 1452   ALT 83 (H) 01/08/2014 1203   ALKPHOS 97 05/24/2019 1452   ALKPHOS 87 01/08/2014 1203   BILITOT 0.5 05/24/2019 1452   BILITOT 0.67 01/08/2014 1203   GFRNONAA >60 05/24/2019 1452   GFRNONAA >60 10/15/2011 0224   GFRAA >60 05/24/2019 1452   GFRAA >60 10/15/2011 0224    No results found for: SPEP, UPEP  Lab Results  Component Value Date   WBC 4.3 05/24/2019   NEUTROABS 3.0 05/24/2019   HGB 16.4 05/24/2019   HCT 46.5 05/24/2019   MCV 92.4 05/24/2019   PLT 187 05/24/2019      Chemistry      Component Value Date/Time   NA 139 05/24/2019 1452   NA 142 10/03/2018 1450   NA 141 05/21/2014 1226   K 4.1 05/24/2019 1452   K 4.3 05/21/2014 1226   CL 100 05/24/2019 1452   CL 108 (H) 12/28/2012 1405   CO2 26 05/24/2019 1452   CO2 28 05/21/2014 1226   BUN 11 05/24/2019 1452   BUN 15 10/03/2018 1450   BUN 13.2 05/21/2014 1226   CREATININE 0.80 05/24/2019 1452   CREATININE 1.00 09/24/2014 1413   CREATININE 0.9 05/21/2014 1226      Component Value Date/Time   CALCIUM 9.8 05/24/2019 1452   CALCIUM 9.3 05/21/2014 1226   ALKPHOS 97 05/24/2019 1452   ALKPHOS 87 01/08/2014 1203   AST 19 05/24/2019 1452   AST 50 (H) 01/08/2014 1203   ALT 21 05/24/2019 1452   ALT 83 (H) 01/08/2014 1203   BILITOT 0.5 05/24/2019 1452   BILITOT 0.67 01/08/2014 1203       RADIOGRAPHIC STUDIES: I have personally reviewed the radiological images as listed below and agreed with the findings in  the report. Ir Removal [employer] W/o [place] Mod Sed  Result Date: 05/01/2019 CLINICAL DATA:  History of head neck cancer, completed chemotherapy. No longer in need of Port a [employer] a catheter was placed on 11/04/2018 by Dr. Rozann Lesches and functioned well throughout duration usage. There is no clinical sign or concern of infection. EXAM: REMOVAL OF IMPLANTED TUNNELED PORT-A-CATH MEDICATIONS: Ancef 2 gm IV. The antibiotic was administered within 1 hour prior to the start of the procedure. ANESTHESIA/SEDATION: None, per patient request FLUOROSCOPY TIME:  None PROCEDURE: Informed written consent was obtained from the patient after a discussion of the risk, benefits and alternatives to the procedure. The patient was positioned supine on the fluoroscopy table and the right chest Port-A-Cath site was prepped with chlorhexidine. A sterile gown and gloves were worn during the procedure. Local anesthesia was provided with 1% lidocaine with epinephrine. A timeout was performed prior to the initiation of the procedure. An incision was made overlying the Port-A-Cath with a #15 scalpel. Utilizing sharp and blunt dissection, the Port-A-Cath was removed completely. The pocked was irrigated with sterile saline. Wound closure was performed with subcutaneous 2-0 Vicryl, subcuticular 4-0 Vicryl, Dermabond and Steri-Strips. A dressing was placed. The patient tolerated the procedure well without immediate post procedural complication. FINDINGS: Successful removal of implant Port-A-Cath without immediate post procedural complication. IMPRESSION: Successful removal of implanted Port-A-Cath. Electronically Signed   By: Sandi Mariscal M.D.   On: 05/01/2019 14:53

## 2019-05-25 ENCOUNTER — Telehealth: Payer: Self-pay | Admitting: Pharmacist

## 2019-05-25 ENCOUNTER — Telehealth: Payer: Self-pay | Admitting: Hematology

## 2019-05-25 LAB — TSH: TSH: 3.216 u[IU]/mL (ref 0.320–4.118)

## 2019-05-25 NOTE — Telephone Encounter (Signed)
Patient called me t 3:40PM 5-NOV-20 reporting he is being put on clotrimazole oral troches. DDI potential with prolongation of INR does exist per DDI checker. Will empirically reduce his daily dose to only 1/2 x 10mg  (5mg ) dose EVERY DAY until Monday 29-May-2019 at next INR check.

## 2019-05-25 NOTE — Telephone Encounter (Signed)
I left a message regarding schedule  

## 2019-05-25 NOTE — Telephone Encounter (Signed)
Thank you. I agree 

## 2019-05-29 ENCOUNTER — Ambulatory Visit: Payer: Medicaid Other

## 2019-05-30 ENCOUNTER — Ambulatory Visit: Payer: Medicaid Other | Admitting: Physical Therapy

## 2019-05-30 ENCOUNTER — Other Ambulatory Visit: Payer: Self-pay

## 2019-05-30 ENCOUNTER — Encounter: Payer: Self-pay | Admitting: Physical Therapy

## 2019-05-30 DIAGNOSIS — I890 Lymphedema, not elsewhere classified: Secondary | ICD-10-CM | POA: Diagnosis not present

## 2019-05-30 DIAGNOSIS — R1310 Dysphagia, unspecified: Secondary | ICD-10-CM | POA: Diagnosis not present

## 2019-05-30 DIAGNOSIS — R293 Abnormal posture: Secondary | ICD-10-CM | POA: Diagnosis not present

## 2019-05-30 NOTE — Therapy (Signed)
[place], [place], [address] Phone: [phone]   Fax:  [phone]  Physical Therapy Treatment  Patient Details  Name: [name] [name] MRN: [id number] Date of Birth: May 05, 1961 Referring Provider (PT): Reita May Date: 05/30/2019  PT End of Session - 05/30/19 1547    Visit Number  7    Number of Visits  8    Date for PT Re-Evaluation  06/13/19    Authorization Type  okay for 4 more visits from 10/19-11/15    Authorization - Visit Number  3    Authorization - Number of Visits  4    PT Start Time  0354    PT Stop Time  1546    PT Time Calculation (min)  39 min    Activity Tolerance  Patient tolerated treatment well    Behavior During Therapy  [facility] for tasks assessed/performed       Past Medical History:  Diagnosis Date  . 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 Benign essential HTN 01/04/2012  . Cancer ([place])    tonsilar  . Chronic anticoagulation 01/02/2013  . Dermatofibroma of forearm 01/02/2013   Left side  . Hyperlipidemia, mixed 01/04/2012  . 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 Primary hypercoagulable state ([place]) 10/01/2014  . Sinus bradycardia, chronic 01/04/2012  . Superficial thrombosis of lower extremity 05/02/2012    Past Surgical History:  Procedure Laterality Date  . DIRECT LARYNGOSCOPY Left 10/19/2018   Procedure: DIRECT LARYNGOSCOPY;  Surgeon: Leta Baptist, MD;  Location: [facility];  Service: ENT;  Laterality: Left;  . IR IMAGING GUIDED PORT INSERTION  11/04/2018  . IR REMOVAL TUN ACCESS W/ PORT W/O FL MOD SED  05/01/2019  . TONSILLECTOMY Left 10/19/2018   Procedure: BIOPSY OF LEFT TONSIL;  Surgeon: Leta Baptist, MD;  Location: [facility];  Service: ENT;  Laterality: Left;     There were no vitals filed for this visit.  Subjective Assessment - 05/30/19 1508    Subjective  I started a new medicine for the thrust and I think it is helping. My swelling is doing good.    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  Yes    Pain Score  1     Pain Location  Throat    Pain Orientation  Left    Pain Descriptors / Indicators  Discomfort                       [facility] Adult PT Treatment/Exercise - 05/30/19 0001      Manual Therapy   Manual Lymphatic Drainage (MLD)  in supine head of bed elevated: 5 diaphragmatic breaths, short neck, bilateral axillary nodes, bilateral pectoral nodes, posterior, lateral and anterior neck moving fluid towards pathways                   PT Long Term Goals - 05/16/19 1555      PT LONG TERM GOAL #1   Title  Pt will be able to independently manage lymphedema through self  MLD and compression garments    Status  Partially Met      PT LONG TERM GOAL #2   Title  Pt will receive appropriate compression garments for long term management of lymphedema    Status  Partially Met      PT LONG TERM GOAL #3   Title  Pt will reduce swelling at 8 cm superior to sternal notch by 2 cm to decrease risk of swelling.    Status  Achieved            Plan - 05/30/19 1547    Clinical Impression Statement  Continued with MLD to head and neck today. Pt's swelling is more controlled today. He reports he wears the chip pack often. Therapist emailed both [employer] and [employer] for a status update on garment and pump delivery but have not heard back yet. Pt would benefit from a Medicaid renewal at next session to ensure he gets his pump and garment and is able to manage lymphedema independently. He also may benefit from balance exercises and  goals for that on next renewal.    PT Frequency  1x / week    PT Duration  4 weeks    PT Treatment/Interventions  ADLs/Self Care Home Management;Therapeutic exercise;Manual lymph drainage;Manual techniques;Compression bandaging;Taping    PT Next Visit Plan  medicaid renewal if pt does not have pump or garment, also may benefit from renewal for balance and exercise - discussed this with pt initially at eval so could add goals for this moving forward, MLD to head and neck and instruct pt per handout issued 9/23. continue to work on fullness in neck and supraclavicular area.  Reinforce neck ROM  possibly work on balance once lymphedema is managed    PT Home Exercise Plan  wear chip pack    Consulted and Agree with Plan of Care  Patient       Patient will benefit from skilled therapeutic intervention in order to improve the following deficits and impairments:  Postural dysfunction, Decreased strength, Increased edema, Decreased knowledge of precautions  Visit Diagnosis: Lymphedema, not elsewhere classified     Problem List Patient Active Problem List   Diagnosis Date Noted  . Warfarin anticoagulation 02/20/2019  . Hypercholesteremia 02/07/2019  . Recurrent deep vein thrombosis (DVT) ([place]) 02/06/2019  . Thoracic ascending aortic aneurysm ([place]) 02/06/2019  . Port-A-Cath in place 01/18/2019  . Mucositis due to chemotherapy 12/07/2018  . Hypomagnesemia 12/07/2018  . Thrush 12/07/2018  . Malignant neoplasm of tonsillar fossa ([place]) 10/17/2018  . 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 DVT, lower extremity, proximal ([place]) 05/24/2013  . Chronic anticoagulation 01/02/2013  . Superficial thrombosis of lower extremity 05/02/2012  . Benign essential HTN 01/04/2012  . Hyperlipidemia, mixed 01/04/2012    Allyson Sabal [facility] 05/30/2019, 3:50 PM  [place], [place], [address] Phone: [phone]   Fax:  [phone]  Name: DINA MOBLEY MRN: [id number] Date of Birth: [birth year]  Manus Gunning, PT 05/30/19 3:50 PM

## 2019-06-01 ENCOUNTER — Ambulatory Visit: Payer: Medicaid Other | Admitting: Rehabilitation

## 2019-06-01 ENCOUNTER — Encounter: Payer: Self-pay | Admitting: Rehabilitation

## 2019-06-01 ENCOUNTER — Other Ambulatory Visit: Payer: Self-pay

## 2019-06-01 DIAGNOSIS — R293 Abnormal posture: Secondary | ICD-10-CM

## 2019-06-01 DIAGNOSIS — R1310 Dysphagia, unspecified: Secondary | ICD-10-CM | POA: Diagnosis not present

## 2019-06-01 DIAGNOSIS — I890 Lymphedema, not elsewhere classified: Secondary | ICD-10-CM | POA: Diagnosis not present

## 2019-06-01 NOTE — Therapy (Signed)
[place], [place], [address] Phone: [phone]   Fax:  [phone]  Physical Therapy Treatment  Patient Details  Name: [name] [name] MRN: [id number] Date of Birth: 10-28-1960 Referring Provider (PT): Reita May Date: 06/01/2019  PT End of Session - 06/01/19 1651    Visit Number  8    Number of Visits  12    Date for PT Re-Evaluation  06/29/19    Authorization Type  pending visit approval after 11/15    PT Start Time  1608    PT Stop Time  1648    PT Time Calculation (min)  40 min    Activity Tolerance  Patient tolerated treatment well    Behavior During Therapy  [facility] for tasks assessed/performed       Past Medical History:  Diagnosis Date  . 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 Benign essential HTN 01/04/2012  . Cancer ([place])    tonsilar  . Chronic anticoagulation 01/02/2013  . Dermatofibroma of forearm 01/02/2013   Left side  . Hyperlipidemia, mixed 01/04/2012  . 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 Primary hypercoagulable state ([place]) 10/01/2014  . Sinus bradycardia, chronic 01/04/2012  . Superficial thrombosis of lower extremity 05/02/2012    Past Surgical History:  Procedure Laterality Date  . DIRECT LARYNGOSCOPY Left 10/19/2018   Procedure: DIRECT LARYNGOSCOPY;  Surgeon: Leta Baptist, MD;  Location: [facility];  Service: ENT;  Laterality: Left;  . IR IMAGING GUIDED PORT INSERTION  11/04/2018  . IR REMOVAL TUN ACCESS W/ PORT W/O FL MOD SED  05/01/2019  . TONSILLECTOMY Left 10/19/2018   Procedure: BIOPSY OF LEFT TONSIL;  Surgeon: Leta Baptist, MD;  Location: [facility];  Service: ENT;  Laterality: Left;    There were no vitals filed for this visit.  Subjective Assessment - 06/01/19 1612     Subjective  Still have not heard from pump or garment people.  Having the normal pain. 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  Yes    Pain Score  1     Pain Location  Throat    Pain Orientation  Left    Pain Descriptors / Indicators  Aching    Pain Type  Surgical pain    Pain Onset  More than a month ago    Pain Frequency  Constant            LYMPHEDEMA/ONCOLOGY QUESTIONNAIRE - 06/01/19 1619      Head and Neck   8 cm superior to sternal notch around neck  49.1 cm                [facility] Adult PT Treatment/Exercise - 06/01/19 0001      Manual Therapy   Manual Lymphatic Drainage (MLD)  in supine head of bed elevated: 5 diaphragmatic breaths, short neck, bilateral axillary nodes, bilateral pectoral nodes, posterior, lateral and anterior neck moving fluid towards pathways  PT Long Term Goals - 06/01/19 1614      PT LONG TERM GOAL #1   Title  Pt will be able to independently manage lymphedema through self MLD and compression garments    Status  Partially Met      PT LONG TERM GOAL #2   Title  Pt will receive appropriate compression garments for long term management of lymphedema    Status  Partially Met      PT LONG TERM GOAL #3   Title  Pt will reduce swelling at 8 cm superior to sternal notch by 2 cm to decrease risk of swelling.    Baseline  50.2 cm, 04/25/19- 50.4 but rest of neck demonstrates around 1.5 cm reduction, 06/01/19: 49.1    Status  Partially Met            Plan - 06/01/19 1652    Clinical Impression Statement  Submitted medicaid approval for more visits.  Neck swelling is softer and more uniform since this PT last saw this pt.  He has fluctuating neck measurements having met his reduction goal  but now missing it by around 0.5cm.  Pt is still waiting for his flexitouch compression pump and compression garment for discharge. Pt also mentions that his balance and endurance seem fine currently as does not think more visits are needed for this.    Personal Factors and Comorbidities  Comorbidity 1    Comorbidities  history of multiple DVT and PE    PT Frequency  1x / week    PT Duration  4 weeks    PT Treatment/Interventions  ADLs/Self Care Home Management;Therapeutic exercise;Manual lymph drainage;Manual techniques;Compression bandaging;Taping    PT Next Visit Plan  cont head and neck MLD until garment and pump arrive    Consulted and Agree with Plan of Care  Patient       Patient will benefit from skilled therapeutic intervention in order to improve the following deficits and impairments:  Postural dysfunction, Decreased strength, Increased edema, Decreased knowledge of precautions  Visit Diagnosis: Lymphedema, not elsewhere classified  Abnormal posture     Problem List Patient Active Problem List   Diagnosis Date Noted  . Warfarin anticoagulation 02/20/2019  . Hypercholesteremia 02/07/2019  . Recurrent deep vein thrombosis (DVT) ([place]) 02/06/2019  . Thoracic ascending aortic aneurysm ([place]) 02/06/2019  . Port-A-Cath in place 01/18/2019  . Mucositis due to chemotherapy 12/07/2018  . Hypomagnesemia 12/07/2018  . Thrush 12/07/2018  . Malignant neoplasm of tonsillar fossa ([place]) 10/17/2018  . 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 DVT, lower extremity, proximal ([place]) 05/24/2013  . Chronic anticoagulation 01/02/2013  . Superficial thrombosis of lower extremity 05/02/2012  . Benign essential HTN 01/04/2012  . Hyperlipidemia, mixed 01/04/2012    [name] 06/01/2019, 4:55 PM  [place], [place], [address] Phone:  [phone]   Fax:  [phone]  Name: FERNIE GRIMM MRN: [id number] Date of Birth: 09/21/1960

## 2019-06-05 NOTE — Addendum Note (Signed)
Addended by: Tish Men on: 06/05/2019 01:02 PM   Modules accepted: Orders

## 2019-06-07 ENCOUNTER — Ambulatory Visit (INDEPENDENT_AMBULATORY_CARE_PROVIDER_SITE_OTHER): Payer: Medicaid Other | Admitting: Internal Medicine

## 2019-06-07 ENCOUNTER — Other Ambulatory Visit: Payer: Self-pay

## 2019-06-07 ENCOUNTER — Encounter: Payer: Self-pay | Admitting: Internal Medicine

## 2019-06-07 VITALS — Ht 75.0 in | Wt 240.0 lb

## 2019-06-07 DIAGNOSIS — B370 Candidal stomatitis: Secondary | ICD-10-CM | POA: Diagnosis not present

## 2019-06-07 DIAGNOSIS — C090 Malignant neoplasm of tonsillar fossa: Secondary | ICD-10-CM | POA: Diagnosis not present

## 2019-06-07 DIAGNOSIS — I890 Lymphedema, not elsewhere classified: Secondary | ICD-10-CM | POA: Diagnosis not present

## 2019-06-07 MED ORDER — ISAVUCONAZONIUM SULFATE 186 MG PO CAPS: 1.0000 | ORAL_CAPSULE | ORAL | 0 refills | Status: DC

## 2019-06-07 NOTE — Progress Notes (Signed)
[facility] for Infectious Disease      Reason for Consult:thrush    Referring Physician: Dr. Maylon Peppers    Patient ID: [name] [name], male    DOB: Sep 04, 1960, 58 y.o.   MRN: [id number]  HPI:   Here for thrush.  He has been treated for this with fluconazole but with concurrent coumadin his INR level increased significantly.  He was changed to nystatin but is having little improvement.  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  He is having some cough and shows some darkened flakes that he has spit up.  He has had no weight loss or diarrhea.  He has some increased dfficulty with swallowing.    Previous record reviewed in Epic and partially reviewed above.   Past Medical History:  Diagnosis Date  . 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 Benign essential HTN 01/04/2012  . Cancer ([place])    tonsilar  . Chronic anticoagulation 01/02/2013  . Dermatofibroma of forearm 01/02/2013   Left side  . Hyperlipidemia, mixed 01/04/2012  . 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 Primary hypercoagulable state ([place]) 10/01/2014  . Sinus bradycardia, chronic 01/04/2012  . Superficial thrombosis of lower extremity 05/02/2012    Prior to Admission medications   Medication Sig Start Date End Date Taking? Authorizing Provider  amLODipine (NORVASC) 10 MG tablet Take 1 tablet (10 mg total) by mouth daily. 03/15/19  Yes Christian, Rylee, MD  atorvastatin (LIPITOR) 20 MG tablet Take 1 tablet (20 mg total) by mouth daily. Patient taking differently: Take 20 mg by mouth as needed. Few times a week per pt. 02/07/19 02/07/20 Yes Christian, Rylee, MD  clotrimazole (MYCELEX) 10 MG troche Take 1 tablet (10 mg total) by mouth 5 (five) times daily for 14 days. 05/24/19 06/07/19 Yes Tish Men, MD   ibuprofen (ADVIL) 200 MG tablet Take 600 mg by mouth every 6 (six) hours as needed for pain or fever.    Yes [provider]  sodium fluoride (PREVIDENT 5000 PLUS) 1.1 % CREA dental cream Apply to tooth brush. Brush teeth for 2 minutes. Spit out excess. DO NOT rinse afterwards. Repeat nightly. 10/24/18  Yes Lenn Cal, DDS  warfarin (COUMADIN) 10 MG tablet Take 1 tablet of 10 mg warfarin daily except on Mondays, Wednesdays and Fridays, take only 1/2 tablet (5 mg). 05/22/19  Yes Pennie Banter, RPH-CPP  Isavuconazonium Sulfate 186 MG CAPS Take 1 capsule (186 mg total) by mouth as directed. Load with 2 capsules every 8 hours x 6 doses, then 2 capsules daily the following day 06/07/19   Thayer Headings, MD    No Known Allergies  Social History   Tobacco Use  . Smoking status: Former Smoker    Packs/day: 0.50    Years: 30.00    Pack years: 15.00    Types: Cigarettes    Quit date: 10/19/2018    Years since quitting: 0.6  . Smokeless tobacco: Never Used  Substance Use Topics  . Alcohol use: Yes    Alcohol/week: 13.0 - 14.0 standard drinks    Types: 12 Cans of beer, 1 - 2 Shots of liquor per week    Comment: 1-2 times per week.  . Drug use: No    Family  History  Problem Relation Age of Onset  . Stroke Father     Review of Systems  Constitutional: negative for fevers, chills and anorexia Gastrointestinal: negative for nausea and diarrhea Integument/breast: negative for rash Musculoskeletal: negative for myalgias and arthralgias All other systems reviewed and are negative    Constitutional: in no apparent distress There were no vitals filed for this visit. EYES: anicteric ENMT: + thrush on tongue, none noted in other sites Cardiovascular: Cor RRR Respiratory: CTA B; normal respiratory effort GI: Bowel sounds are normal, liver is not enlarged, spleen is not enlarged Musculoskeletal: no pedal edema noted Skin: negatives: no rash Hematologic: no cervical lad; mainly  feel changes from previous radiation  Labs: Lab Results  Component Value Date   WBC 4.3 05/24/2019   HGB 16.4 05/24/2019   HCT 46.5 05/24/2019   MCV 92.4 05/24/2019   PLT 187 05/24/2019    Lab Results  Component Value Date   CREATININE 0.80 05/24/2019   BUN 11 05/24/2019   NA 139 05/24/2019   K 4.1 05/24/2019   CL 100 05/24/2019   CO2 26 05/24/2019    Lab Results  Component Value Date   ALT 21 05/24/2019   AST 19 05/24/2019   ALKPHOS 97 05/24/2019   BILITOT 0.5 05/24/2019   INR 7.4 (A) 05/22/2019     Assessment: persistent thrush.  Can't take fluconazole.  Will use isavuconazonium, which requires a PA.  Will recheck in 10 days or so  Plan: 1) isavuconazonium for 14 days including a loading dose 2) recheck in 12 days for improvement

## 2019-06-08 ENCOUNTER — Telehealth: Payer: Self-pay | Admitting: Pharmacy Technician

## 2019-06-08 ENCOUNTER — Telehealth: Payer: Self-pay | Admitting: Hematology

## 2019-06-08 DIAGNOSIS — I890 Lymphedema, not elsewhere classified: Secondary | ICD-10-CM | POA: Diagnosis not present

## 2019-06-08 NOTE — Telephone Encounter (Signed)
Notified pharmacy and patient. Landis Gandy, RN

## 2019-06-08 NOTE — Telephone Encounter (Signed)
Returned patient's phone call regarding rescheduling an appointment, left a voicemail. 

## 2019-06-08 NOTE — Telephone Encounter (Signed)
[facility] Patient Advocate Encounter  Prior Authorization for Corky Mull has been approved.    PA# [id number] Effective dates: 06/08/2019 through 09/06/2019  Patients co-pay is $3.    Venida Jarvis. Nadara Mustard [place] Patient [facility] for Infectious Disease Phone: [phone] Fax:  [phone]

## 2019-06-13 ENCOUNTER — Telehealth: Payer: Self-pay | Admitting: *Deleted

## 2019-06-13 ENCOUNTER — Encounter: Payer: Self-pay | Admitting: Physical Therapy

## 2019-06-13 ENCOUNTER — Other Ambulatory Visit: Payer: Self-pay

## 2019-06-13 ENCOUNTER — Ambulatory Visit: Payer: Medicaid Other | Admitting: Physical Therapy

## 2019-06-13 DIAGNOSIS — I890 Lymphedema, not elsewhere classified: Secondary | ICD-10-CM

## 2019-06-13 DIAGNOSIS — R293 Abnormal posture: Secondary | ICD-10-CM | POA: Diagnosis not present

## 2019-06-13 DIAGNOSIS — R1310 Dysphagia, unspecified: Secondary | ICD-10-CM

## 2019-06-13 NOTE — Telephone Encounter (Signed)
Oncology Nurse Navigator Documentation  In follow-up to call with Dr. Deeann Saint office and confirmation they LVMM 11/11 asking pt to call them to schedule appt, I called pt and his SO, LVMM encouraging return call to Dr. Deeann Saint office to schedule appt.  Gayleen Orem, RN, BSN Head & Neck Oncology Nurse [place] at [place] [phone]

## 2019-06-13 NOTE — Therapy (Signed)
[place], [place], [address] Phone: [phone]   Fax:  [phone]  Physical Therapy Treatment  Patient Details  Name: [name] [name] MRN: [id number] Date of Birth: [birth year] Referring Provider (PT): Reita May Date: 06/13/2019  PT End of Session - 06/13/19 1555    Visit Number  9    Number of Visits  12    Date for PT Re-Evaluation  06/29/19    PT Start Time  1500    PT Stop Time  1545    PT Time Calculation (min)  45 min    Activity Tolerance  Patient tolerated treatment well    Behavior During Therapy  [facility] for tasks assessed/performed       Past Medical History:  Diagnosis Date  . 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 Benign essential HTN 01/04/2012  . Cancer ([place])    tonsilar  . Chronic anticoagulation 01/02/2013  . Dermatofibroma of forearm 01/02/2013   Left side  . Hyperlipidemia, mixed 01/04/2012  . 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 Primary hypercoagulable state ([place]) 10/01/2014  . Sinus bradycardia, chronic 01/04/2012  . Superficial thrombosis of lower extremity 05/02/2012    Past Surgical History:  Procedure Laterality Date  . DIRECT LARYNGOSCOPY Left 10/19/2018   Procedure: DIRECT LARYNGOSCOPY;  Surgeon: Leta Baptist, MD;  Location: [facility];  Service: ENT;  Laterality: Left;  . IR IMAGING GUIDED PORT INSERTION  11/04/2018  . IR REMOVAL TUN ACCESS W/ PORT W/O FL MOD SED  05/01/2019  . TONSILLECTOMY Left 10/19/2018   Procedure: BIOPSY OF LEFT TONSIL;  Surgeon: Leta Baptist, MD;  Location: [facility];  Service: ENT;  Laterality: Left;    There were no vitals filed for this visit.  Subjective Assessment - 06/13/19 1552    Subjective  Pt says he is having some pain in his  throat today, "It hurts a little more"  He got the pump delivered and is waiting for the rep to come to his house to set it up He got his garment and has tried to wear it. He feels that his jaw is tightening up and that he cannot open his mouth as much.  He is having trouble with thrush and the medicine that has been prescribed last wednesday  has not come in to the pharmacy yet.  Pt appears to be very uncomfortable.    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  Yes    Pain Score  3     Pain Location  Throat    Pain Orientation  Left    Pain Descriptors / Indicators  Sore    Pain Onset  More than a month ago    Pain Frequency  Constant                       [facility] Adult PT Treatment/Exercise - 06/13/19 0001      Exercises   Exercises  Neck      Neck Exercises: Seated   Other Seated Exercise  neck  ROM and shoulder rolls.  Pt is very tight with neck extension       Manual Therapy   Manual Lymphatic Drainage (MLD)  in supine head of bed elevated: 5 diaphragmatic breaths, short neck, bilateral axillary nodes, bilateral pectoral nodes, posterior, lateral and anterior neck moving fluid towards pathways    extra time spent by TMJ as pt states he is tightening up                  PT Long Term Goals - 06/01/19 1614      PT LONG TERM GOAL #1   Title  Pt will be able to independently manage lymphedema through self MLD and compression garments    Status  Partially Met      PT LONG TERM GOAL #2   Title  Pt will receive appropriate compression garments for long term management of lymphedema    Status  Partially Met      PT LONG TERM GOAL #3   Title  Pt will reduce swelling at 8 cm superior to sternal notch by 2 cm to decrease risk of swelling.    Baseline  50.2 cm,  04/25/19- 50.4 but rest of neck demonstrates around 1.5 cm reduction, 06/01/19: 49.1    Status  Partially Met            Plan - 06/13/19 1556    Clinical Impression Statement  Pt appears to be more uncomfortable today.  He says he has thrush and has not been able to get medicine for it.  He got his Flexitouch and garment and will bring it in next session to assess fit.  Inbasket sent to Gayleen Orem to contatct pt to talk about his symptoms.    Comorbidities  history of multiple DVT and PE    PT Treatment/Interventions  ADLs/Self Care Home Management;Therapeutic exercise;Manual lymph drainage;Manual techniques;Compression bandaging;Taping    PT Next Visit Plan  check garment, and see if pt is using Flexi cont head and neck MLD until he is stable and comfortable using pump and garment to control lymphedema    Consulted and Agree with Plan of Care  Patient       Patient will benefit from skilled therapeutic intervention in order to improve the following deficits and impairments:  Postural dysfunction, Decreased strength, Increased edema, Decreased knowledge of precautions  Visit Diagnosis: Lymphedema, not elsewhere classified  Abnormal posture  Dysphagia, unspecified type     Problem List Patient Active Problem List   Diagnosis Date Noted  . Warfarin anticoagulation 02/20/2019  . Hypercholesteremia 02/07/2019  . Recurrent deep vein thrombosis (DVT) ([place]) 02/06/2019  . Thoracic ascending aortic aneurysm ([place]) 02/06/2019  . Port-A-Cath in place 01/18/2019  . Mucositis due to chemotherapy 12/07/2018  . Hypomagnesemia 12/07/2018  . Thrush 12/07/2018  . Malignant neoplasm of tonsillar fossa ([place]) 10/17/2018  . 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 DVT, lower extremity, proximal ([place]) 05/24/2013  . Chronic anticoagulation 01/02/2013  . Superficial thrombosis of lower extremity 05/02/2012  . Benign  essential HTN 01/04/2012  . Hyperlipidemia, mixed 01/04/2012   [name] [name]. Owens Shark PT  [place] Levo 06/13/2019, 4:00 PM  [place], [place], [address] Phone: [phone]   Fax:  [phone]  Name: [name] [name] MRN: [id number] Date of Birth: 10/21/1960

## 2019-06-14 ENCOUNTER — Telehealth: Payer: Self-pay

## 2019-06-14 NOTE — Telephone Encounter (Signed)
COVID-19 Pre-Screening Questions:06/14/19  Do you currently have a fever (>100 F), chills or unexplained body aches? NO   Are you currently experiencing new cough, shortness of breath, sore throat, runny nose? NO .  Have you recently travelled outside the state of [place] in the last 14 days? NO  .  Have you been in contact with someone that is currently pending confirmation of Covid19 testing or has been confirmed to have the [place] virus?  NO   **If the patient answers NO to ALL questions -  advise the patient to please call the clinic before coming to the office should any symptoms develop.

## 2019-06-19 ENCOUNTER — Ambulatory Visit (INDEPENDENT_AMBULATORY_CARE_PROVIDER_SITE_OTHER): Payer: Medicaid Other | Admitting: Pharmacist

## 2019-06-19 ENCOUNTER — Other Ambulatory Visit: Payer: Self-pay

## 2019-06-19 ENCOUNTER — Encounter: Payer: Self-pay | Admitting: Internal Medicine

## 2019-06-19 ENCOUNTER — Ambulatory Visit (HOSPITAL_COMMUNITY): Payer: Medicaid Other

## 2019-06-19 ENCOUNTER — Ambulatory Visit (INDEPENDENT_AMBULATORY_CARE_PROVIDER_SITE_OTHER): Payer: Medicaid Other | Admitting: Internal Medicine

## 2019-06-19 VITALS — BP 119/81 | HR 68 | Temp 97.3°F | Ht 75.0 in | Wt 230.0 lb

## 2019-06-19 DIAGNOSIS — Z86718 Personal history of other venous thrombosis and embolism: Secondary | ICD-10-CM

## 2019-06-19 DIAGNOSIS — Z7901 Long term (current) use of anticoagulants: Secondary | ICD-10-CM

## 2019-06-19 DIAGNOSIS — Z5181 Encounter for therapeutic drug level monitoring: Secondary | ICD-10-CM | POA: Diagnosis not present

## 2019-06-19 DIAGNOSIS — B370 Candidal stomatitis: Secondary | ICD-10-CM

## 2019-06-19 DIAGNOSIS — D6859 Other primary thrombophilia: Secondary | ICD-10-CM

## 2019-06-19 LAB — POCT INR: INR: 2.1 (ref 2.0–3.0)

## 2019-06-19 NOTE — Progress Notes (Signed)
I reviewed Dr. [name] note.  Patient is on [facility] for hypercoagulable state.  INR low at 2.1 and his warfarin was increased.

## 2019-06-19 NOTE — Patient Instructions (Signed)
Patient instructed to take medications as defined in the Anti-coagulation Track section of this encounter.  Patient instructed to take today's dose.  Patient instructed to take one (1) tablet of your 10mg -white warfarin tablets on Mondays, Wednesdays and Fridays. All OTHER days--take only one-half (1/2) of your 10mg -white warfarin tablets.  Patient verbalized understanding of these instructions.

## 2019-06-19 NOTE — Progress Notes (Signed)
   Subjective:    Patient ID: [name] [name], male    DOB: 12/12/1960, 58 y.o.   MRN: [id number]  HPI Here for follow up of thrush He has a history of squamous cell CA of his tonsil s/p chemo and radiation and has had issues with oral thrush.  On coumadin so unable to take fluconazole. Started on isavuconazonium about 5 days ago.  No improvement   Review of Systems  Constitutional: Negative for fatigue and unexpected weight change.  Skin: Negative for rash.       Objective:   Physical Exam Constitutional:      Appearance: Normal appearance.  HENT:     Mouth/Throat:     Comments: Still with white coating on tongue, whitish discharge Unable to open mouth wide Neurological:     Mental Status: He is alert.           Assessment & Plan:

## 2019-06-19 NOTE — Progress Notes (Signed)
Anticoagulation Management [name] [name] is a 58 y.o. male who reports to the clinic for monitoring of warfarin treatment.    Indication: PE, history of (resolved); Long term current use of anticoagulant; Hypercoagulable state as per Dr. Beryle Beams.   Duration: indefinite Supervising physician: Gilles Chiquito  Anticoagulation Clinic Visit History: Patient does not report signs/symptoms of bleeding or thromboembolism  Other recent changes: No diet, medications, lifestyle changes except as noted in patient findings.  Anticoagulation Episode Summary    Current INR goal:  2.5-3.5  TTR:  33.0 % (8 y)  Next INR check:  06/26/2019  INR from last check:  2.1 (06/19/2019)  Weekly max warfarin dose:    Target end date:    INR check location:    Preferred lab:    Send INR reminders to:  [facility]   Indications   Pulmonary embolism ([place]) (Resolved) [I26.99]       Comments:        Anticoagulation Care Providers    Provider Role Specialty Phone number   Annia Belt, MD Referring Oncology [phone]      No Known Allergies  Current Outpatient Medications:  .  amLODipine (NORVASC) 10 MG tablet, Take 1 tablet (10 mg total) by mouth daily., Disp: 90 tablet, Rfl: 2 .  atorvastatin (LIPITOR) 20 MG tablet, Take 1 tablet (20 mg total) by mouth daily. (Patient taking differently: Take 20 mg by mouth as needed. Few times a week per pt.), Disp: 30 tablet, Rfl: 11 .  ibuprofen (ADVIL) 200 MG tablet, Take 600 mg by mouth every 6 (six) hours as needed for pain or fever. , Disp: , Rfl:  .  Isavuconazonium Sulfate 186 MG CAPS, Take 1 capsule (186 mg total) by mouth as directed. Load with 2 capsules every 8 hours x 6 doses, then 2 capsules daily the following day, Disp: 36 capsule, Rfl: 0 .  sodium fluoride (PREVIDENT 5000 PLUS) 1.1 % CREA dental cream, Apply to tooth brush. Brush teeth for 2 minutes. Spit out excess. DO NOT rinse afterwards. Repeat nightly., Disp: 1 Tube, Rfl: prn .   warfarin (COUMADIN) 10 MG tablet, Take 1 tablet of 10 mg warfarin daily except on Mondays, Wednesdays and Fridays, take only 1/2 tablet (5 mg)., Disp: 24 tablet, Rfl: 2 No current facility-administered medications for this visit.   Facility-Administered Medications Ordered in Other Visits:  .  heparin lock flush 100 unit/mL, 500 Units, Intravenous, Once, Tish Men, MD .  sodium chloride flush (NS) 0.9 % injection 10 mL, 10 mL, Intravenous, PRN, Tish Men, MD Past Medical History:  Diagnosis Date  . 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 Benign essential HTN 01/04/2012  . 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 Chronic anticoagulation 01/02/2013  . Dermatofibroma of forearm 01/02/2013   Left side  . Hyperlipidemia, mixed 01/04/2012  . 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 Primary hypercoagulable state ([place]) 10/01/2014  . Sinus bradycardia, chronic 01/04/2012  . Superficial thrombosis of lower extremity 05/02/2012   Social History   Socioeconomic History  . Marital status: Divorced    Spouse name: Not on file  . Number of children: 2  . Years of education: Not on file  . Highest education level: Not on file  Occupational History  . Not on file  Social Needs  . Financial  resource strain: Not on file  . Food insecurity    Worry: Not on file    Inability: Not on file  . Transportation needs    Medical: No    Non-medical: No  Tobacco Use  . Smoking status: Former Smoker    Packs/day: 0.50    Years: 30.00    Pack years: 15.00    Types: Cigarettes    Quit date: 10/19/2018    Years since quitting: 0.6  . Smokeless tobacco: Never Used  Substance and Sexual Activity  . Alcohol use: Yes    Alcohol/week: 13.0 - 14.0 standard drinks    Types: 12 Cans of beer, 1 - 2 Shots of liquor per week    Comment: 1-2 times per week.  .  Drug use: No  . Sexual activity: Not on file  Lifestyle  . Physical activity    Days per week: Not on file    Minutes per session: Not on file  . Stress: Not on file  Relationships  . Social Herbalist on phone: Not on file    Gets together: Not on file    Attends religious service: Not on file    Active member of club or organization: Not on file    Attends meetings of clubs or organizations: Not on file    Relationship status: Not on file  Other Topics Concern  . Not on file  Social History Narrative  . Not on file   Family History  Problem Relation Age of Onset  . Stroke Father     ASSESSMENT Recent Results: The most recent result is correlated with 35 mg per week: Lab Results  Component Value Date   INR 2.1 06/19/2019   INR 7.4 (A) 05/22/2019   INR 7.4 (HH) 05/22/2019   PROTIME 28.8 (H) 02/04/2015    Anticoagulation Dosing: Description   Take one (1) tablet of your 10mg -white warfarin tablets on Mondays, Wednesdays and Fridays. All OTHER days--take only one-half (1/2) of your 10mg -white warfarin tablets.      INR today: Therapeutic  PLAN Weekly dose was increased to 50mg  warfarin per week.   Patient Instructions  Patient instructed to take medications as defined in the Anti-coagulation Track section of this encounter.  Patient instructed to take today's dose.  Patient instructed to take one (1) tablet of your 10mg -white warfarin tablets on Mondays, Wednesdays and Fridays. All OTHER days--take only one-half (1/2) of your 10mg -white warfarin tablets.  Patient verbalized understanding of these instructions.    Patient advised to contact clinic or seek medical attention if signs/symptoms of bleeding or thromboembolism occur.  Patient verbalized understanding by repeating back information and was advised to contact me if further medication-related questions arise. Patient was also provided an information handout.  Follow-up Return in about 1 week  (around 06/26/2019) for Follow up INR.  Pennie Banter, PharmD, CPP  15 minutes spent face-to-face with the patient during the encounter. 50% of time spent on education, including signs/sx bleeding and clotting, as well as food and drug interactions with warfarin. 50% of time was spent on fingerprick POC INR sample collection,processing, results determination, and documentation in [email or web address]/.

## 2019-06-20 ENCOUNTER — Telehealth: Payer: Self-pay | Admitting: Hematology

## 2019-06-20 ENCOUNTER — Encounter: Payer: Self-pay | Admitting: Internal Medicine

## 2019-06-20 NOTE — Assessment & Plan Note (Signed)
Avoiding fluconazole due to Warfarin

## 2019-06-20 NOTE — Assessment & Plan Note (Addendum)
Not improved.  Maybe not Candida or thrush.  He will continue with the same I will send a fungal culture  ADDENDUM: fungal culture with no growth.  No  Improvement with good antifungal coverage.  It seems to be a non-infectious process.  I will see if Dr. Benjamine Mola feels a biopsy of it would help confirm a diagnosis.

## 2019-06-20 NOTE — Telephone Encounter (Signed)
Returned patient's phone call regarding cancelling 12/02 appointment, per patient's request appointment has been cancelled.

## 2019-06-21 ENCOUNTER — Inpatient Hospital Stay: Payer: Medicaid Other | Admitting: Hematology

## 2019-06-21 ENCOUNTER — Inpatient Hospital Stay: Payer: Medicaid Other

## 2019-06-22 ENCOUNTER — Other Ambulatory Visit: Payer: Self-pay

## 2019-06-22 ENCOUNTER — Ambulatory Visit: Payer: Medicaid Other | Attending: Radiation Oncology | Admitting: Physical Therapy

## 2019-06-22 ENCOUNTER — Encounter: Payer: Self-pay | Admitting: Physical Therapy

## 2019-06-22 DIAGNOSIS — I890 Lymphedema, not elsewhere classified: Secondary | ICD-10-CM | POA: Diagnosis not present

## 2019-06-22 DIAGNOSIS — R293 Abnormal posture: Secondary | ICD-10-CM | POA: Diagnosis not present

## 2019-06-22 NOTE — Therapy (Signed)
[place], [place], [address] Phone: [phone]   Fax:  [phone]  Physical Therapy Treatment  Patient Details  Name: [name] [name] MRN: [id number] Date of Birth: 03-18-1961 Referring Provider (PT): Reita May Date: 06/22/2019  PT End of Session - 06/22/19 6384    Visit Number  10    Number of Visits  12    Date for PT Re-Evaluation  06/29/19    Authorization Type  pending visit approval after 11/15, approved for 4 additional visits until 12/14    PT Start Time  1504    PT Stop Time  1546    PT Time Calculation (min)  42 min    Activity Tolerance  Patient tolerated treatment well    Behavior During Therapy  [facility] for tasks assessed/performed       Past Medical History:  Diagnosis Date  . 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 Benign essential HTN 01/04/2012  . 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 Chronic anticoagulation 01/02/2013  . Dermatofibroma of forearm 01/02/2013   Left side  . Hyperlipidemia, mixed 01/04/2012  . 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 Primary hypercoagulable state ([place]) 10/01/2014  . Sinus bradycardia, chronic 01/04/2012  . Superficial thrombosis of lower extremity 05/02/2012    Past Surgical History:  Procedure Laterality Date  . DIRECT LARYNGOSCOPY Left 10/19/2018   Procedure: DIRECT LARYNGOSCOPY;  Surgeon: Leta Baptist, MD;  Location: [facility];  Service: ENT;  Laterality: Left;  . IR IMAGING GUIDED PORT INSERTION  11/04/2018  . IR REMOVAL TUN ACCESS W/ PORT W/O FL MOD SED  05/01/2019  . TONSILLECTOMY Left 10/19/2018   Procedure: BIOPSY OF LEFT TONSIL;  Surgeon: Leta Baptist, MD;  Location: [facility];  Service: ENT;  Laterality: Left;    There were no vitals filed for this  visit.  Subjective Assessment - 06/22/19 1506    Subjective  I hooked up the [place] myself and have been using it. I am still waiting for someone to come to my house. I can barely open my mouth.    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  Yes    Pain Score  3     Pain Location  Throat    Pain Orientation  Left    Pain Descriptors / Indicators  Sore    Pain Type  Acute pain    Pain Onset  More than a month ago    Pain Frequency  Constant                       [facility] Adult PT Treatment/Exercise - 06/22/19 0001      Neck Exercises: Seated   Other Seated Exercise  verbally instructed pt in 3 TMJ exercises incluiding depression, lateral glides and circles to help improve jaw mobility      Manual Therapy   Manual Therapy  Soft tissue mobilization    Edema Management  pt brought in his head and neck garment and therapist instructed pt in proper donning  technique as pt had been donning it incorrectly    Soft tissue mobilization  to left sternocleidomastoid, scalenes and musculature surrounding TMJ joint, numerous areas of tightness and tenderness palpated- pt has decreased jaw openening and reports that this has happened in the last week    Manual Lymphatic Drainage (MLD)  in supine head of bed elevated: 5 diaphragmatic breaths, short neck, bilateral axillary nodes, bilateral pectoral nodes, posterior, lateral and anterior neck moving fluid towards pathways                   PT Long Term Goals - 06/01/19 1614      PT LONG TERM GOAL #1   Title  Pt will be able to independently manage lymphedema through self MLD and compression garments    Status  Partially Met      PT LONG TERM GOAL #2   Title  Pt will receive appropriate compression garments for long term  management of lymphedema    Status  Partially Met      PT LONG TERM GOAL #3   Title  Pt will reduce swelling at 8 cm superior to sternal notch by 2 cm to decrease risk of swelling.    Baseline  50.2 cm, 04/25/19- 50.4 but rest of neck demonstrates around 1.5 cm reduction, 06/01/19: 49.1    Status  Partially Met            Plan - 06/22/19 1555    Clinical Impression Statement  Pt is still having difficulty opening his mouth due to jaw pain. Pt reports this has worsened in the last week. Encouraged pt to send an inbox to his doctor to make them aware. Instructed pt in TMJ exercises today as well as performed soft tissue mobilization to cervical postural muscles on L side to help decrease tightness. Numerous areas of tightness noted and pt felt tightness improved with therapy but not jaw opening. Continued with MLD to head and neck. Pt brought in his head and neck garment today so educated pt on how to properly don garment since he had been donning it incorrectly at home.    Rehab Potential  Good    PT Frequency  1x / week    PT Duration  4 weeks    PT Treatment/Interventions  ADLs/Self Care Home Management;Therapeutic exercise;Manual lymph drainage;Manual techniques;Compression bandaging;Taping    PT Next Visit Plan  see if pt sent inbasket to Dr Benjamine Mola about jaw ROM, ask if jaw exercises are helping, STM To left neck, SCM and jaw muscles, and see if pt is using Flexi cont head and neck MLD until he is stable and comfortable using pump and garment to control lymphedema    PT Home Exercise Plan  wear head and neck garment    Consulted and Agree with Plan of Care  Patient       Patient will benefit from skilled therapeutic intervention in order to improve the following deficits and impairments:  Postural dysfunction, Decreased strength, Increased edema, Decreased knowledge of precautions  Visit Diagnosis: Lymphedema, not elsewhere classified  Abnormal posture     Problem List Patient  Active Problem List   Diagnosis Date Noted  . Warfarin anticoagulation 02/20/2019  . Hypercholesteremia 02/07/2019  . Recurrent deep vein thrombosis (DVT) ([place]) 02/06/2019  . Thoracic ascending aortic aneurysm ([place]) 02/06/2019  . Port-A-Cath in place 01/18/2019  . Mucositis due to chemotherapy 12/07/2018  . Hypomagnesemia 12/07/2018  . Thrush 12/07/2018  . Malignant neoplasm of tonsillar fossa ([facility])  10/17/2018  . 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 DVT, lower extremity, proximal ([place]) 05/24/2013  . Chronic anticoagulation 01/02/2013  . Superficial thrombosis of lower extremity 05/02/2012  . Benign essential HTN 01/04/2012  . Hyperlipidemia, mixed 01/04/2012    Allyson Sabal [facility] 06/22/2019, 3:59 PM  [place], [place], [address] Phone: [phone]   Fax:  [phone]  Name: DETRAVION TESTER MRN: [id number] Date of Birth: [birth year]  Manus Gunning, PT 06/22/19 4:00 PM

## 2019-06-23 ENCOUNTER — Telehealth: Payer: Self-pay | Admitting: *Deleted

## 2019-06-23 NOTE — Telephone Encounter (Signed)
Per Dr Linus Salmons called patient to give him the results of recent cultures. Patient did not answer the line left message for him to call the office.

## 2019-06-23 NOTE — Telephone Encounter (Signed)
Patient returned call and was advised of results. He then asked what is going on in his mouth and what should he do. Advised him will ask provider since he does not have a follow up visit.

## 2019-06-23 NOTE — Telephone Encounter (Signed)
-----   Message from Thayer Headings, MD sent at 06/23/2019  2:19 PM EST ----- Please let him know his culture from his tongue is not growing any fungus so far.  Will continue to watch the culture but his thrush does not seem to be Candida/ infectious thrush.  thanks

## 2019-06-26 ENCOUNTER — Ambulatory Visit (INDEPENDENT_AMBULATORY_CARE_PROVIDER_SITE_OTHER): Payer: Medicaid Other | Admitting: Pharmacist

## 2019-06-26 ENCOUNTER — Ambulatory Visit (HOSPITAL_COMMUNITY)
Admission: RE | Admit: 2019-06-26 | Discharge: 2019-06-26 | Disposition: A | Discharge status: Home / Self Care | Payer: Medicaid Other | Source: Ambulatory Visit | Attending: Hematology | Admitting: Hematology

## 2019-06-26 ENCOUNTER — Other Ambulatory Visit: Payer: Self-pay

## 2019-06-26 DIAGNOSIS — C090 Malignant neoplasm of tonsillar fossa: Secondary | ICD-10-CM | POA: Insufficient documentation

## 2019-06-26 DIAGNOSIS — Z7901 Long term (current) use of anticoagulants: Secondary | ICD-10-CM | POA: Diagnosis not present

## 2019-06-26 DIAGNOSIS — I2782 Chronic pulmonary embolism: Secondary | ICD-10-CM

## 2019-06-26 DIAGNOSIS — Z5181 Encounter for therapeutic drug level monitoring: Secondary | ICD-10-CM

## 2019-06-26 DIAGNOSIS — I743 Embolism and thrombosis of arteries of the lower extremities: Secondary | ICD-10-CM

## 2019-06-26 DIAGNOSIS — Z86711 Personal history of pulmonary embolism: Secondary | ICD-10-CM | POA: Diagnosis not present

## 2019-06-26 DIAGNOSIS — D6859 Other primary thrombophilia: Secondary | ICD-10-CM

## 2019-06-26 DIAGNOSIS — I825Y1 Chronic embolism and thrombosis of unspecified deep veins of right proximal lower extremity: Secondary | ICD-10-CM

## 2019-06-26 DIAGNOSIS — Z86718 Personal history of other venous thrombosis and embolism: Secondary | ICD-10-CM

## 2019-06-26 LAB — POCT INR: INR: 3 (ref 2.0–3.0)

## 2019-06-26 MED ORDER — SODIUM CHLORIDE (PF) 0.9 % IJ SOLN
INTRAMUSCULAR | Status: AC
  Filled 2019-06-26: qty 50

## 2019-06-26 MED ORDER — IOHEXOL 300 MG/ML  SOLN
75.0000 mL | Freq: Once | INTRAMUSCULAR | Status: AC | PRN
  Administered 2019-06-26: 16:00:00 75 mL via INTRAVENOUS

## 2019-06-26 NOTE — Patient Instructions (Signed)
Patient instructed to take medications as defined in the Anti-coagulation Track section of this encounter.  Patient instructed to take today's dose.  Patient instructed to take  one (1) tablet of your 10mg -white warfarin tablets on Mondays, Wednesdays and Fridays. All OTHER days--take only one-half (1/2) of your 10mg -white warfarin tablets.  Patient instructed to call Pharmacist Jorene Guest with any signs/sypmtoms of bleeding or with any new medication or discontinuation of any medication by any provider you see.  Patient verbalized understanding of these instructions.

## 2019-06-26 NOTE — Progress Notes (Signed)
Anticoagulation Management [name] [name] is a 58 y.o. male who reports to the clinic for monitoring of warfarin treatment.    Indication: PE, History of (resolved); History of DVT, lower extremity (resolved); History of superficial thrombophlebitis; History of arterial embolism and thrombosis of lower extremity; Long term current use of anticoagulant.   Duration: indefinite Supervising physician: Joni Reining  Anticoagulation Clinic Visit History: Patient does not report signs/symptoms of bleeding or thromboembolism  Other recent changes: No diet, medications, lifestyle changes except as noted in patient findings.  Anticoagulation Episode Summary    Current INR goal:  2.5-3.5  TTR:  33.0 % (8.1 y)  Next INR check:  07/24/2019  INR from last check:    Weekly max warfarin dose:    Target end date:    INR check location:    Preferred lab:    Send INR reminders to:  [facility]   Indications   Pulmonary embolism ([facility]) (Resolved) [I26.99]       Comments:        Anticoagulation Care Providers    Provider Role Specialty Phone number   Annia Belt, MD Referring Oncology [phone]      No Known Allergies  Current Outpatient Medications:  .  amLODipine (NORVASC) 10 MG tablet, Take 1 tablet (10 mg total) by mouth daily., Disp: 90 tablet, Rfl: 2 .  atorvastatin (LIPITOR) 20 MG tablet, Take 1 tablet (20 mg total) by mouth daily. (Patient taking differently: Take 20 mg by mouth as needed. Few times a week per pt.), Disp: 30 tablet, Rfl: 11 .  ibuprofen (ADVIL) 200 MG tablet, Take 600 mg by mouth every 6 (six) hours as needed for pain or fever. , Disp: , Rfl:  .  Isavuconazonium Sulfate 186 MG CAPS, Take 1 capsule (186 mg total) by mouth as directed. Load with 2 capsules every 8 hours x 6 doses, then 2 capsules daily the following day, Disp: 36 capsule, Rfl: 0 .  sodium fluoride (PREVIDENT 5000 PLUS) 1.1 % CREA dental cream, Apply to tooth brush. Brush teeth for 2 minutes. Spit  out excess. DO NOT rinse afterwards. Repeat nightly., Disp: 1 Tube, Rfl: prn .  warfarin (COUMADIN) 10 MG tablet, Take 1 tablet of 10 mg warfarin daily except on Mondays, Wednesdays and Fridays, take only 1/2 tablet (5 mg)., Disp: 24 tablet, Rfl: 2 No current facility-administered medications for this visit.   Facility-Administered Medications Ordered in Other Visits:  .  heparin lock flush 100 unit/mL, 500 Units, Intravenous, Once, Tish Men, MD .  sodium chloride (PF) 0.9 % injection, , , ,  .  sodium chloride flush (NS) 0.9 % injection 10 mL, 10 mL, Intravenous, PRN, Tish Men, MD Past Medical History:  Diagnosis Date  . 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 Benign essential HTN 01/04/2012  . 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 Chronic anticoagulation 01/02/2013  . Dermatofibroma of forearm 01/02/2013   Left side  . Hyperlipidemia, mixed 01/04/2012  . 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 Primary hypercoagulable state ([place]) 10/01/2014  . Sinus bradycardia, chronic 01/04/2012  . Superficial thrombosis of lower extremity 05/02/2012   Social History   Socioeconomic History  . Marital status: Divorced    Spouse name: Not on file  . Number of children: 2  . Years of education: Not  on file  . Highest education level: Not on file  Occupational History  . Not on file  Social Needs  . Financial resource strain: Not on file  . Food insecurity    Worry: Not on file    Inability: Not on file  . Transportation needs    Medical: No    Non-medical: No  Tobacco Use  . Smoking status: Former Smoker    Packs/day: 0.50    Years: 30.00    Pack years: 15.00    Types: Cigarettes    Quit date: 10/19/2018    Years since quitting: 0.6  . Smokeless tobacco: Never Used  Substance and Sexual Activity  . Alcohol use: Yes     Alcohol/week: 13.0 - 14.0 standard drinks    Types: 12 Cans of beer, 1 - 2 Shots of liquor per week    Comment: 1-2 times per week.  . Drug use: No  . Sexual activity: Not on file  Lifestyle  . Physical activity    Days per week: Not on file    Minutes per session: Not on file  . Stress: Not on file  Relationships  . Social Herbalist on phone: Not on file    Gets together: Not on file    Attends religious service: Not on file    Active member of club or organization: Not on file    Attends meetings of clubs or organizations: Not on file    Relationship status: Not on file  Other Topics Concern  . Not on file  Social History Narrative  . Not on file   Family History  Problem Relation Age of Onset  . Stroke Father     ASSESSMENT Recent Results: The most recent result is correlated with 50 mg per week: Lab Results  Component Value Date   INR 3.0 06/26/2019   INR 2.1 06/19/2019   INR 7.4 (A) 05/22/2019   PROTIME 28.8 (H) 02/04/2015    Anticoagulation Dosing: Description   Take one (1) tablet of your 10mg -white warfarin tablets on Mondays, Wednesdays and Fridays. All OTHER days--take only one-half (1/2) of your 10mg -white warfarin tablets.      INR today: Therapeutic  PLAN Weekly dose was unchanged. Patient will remain on 50mg  warfarin/week.   Patient Instructions  Patient instructed to take medications as defined in the Anti-coagulation Track section of this encounter.  Patient instructed to take today's dose.  Patient instructed to take  one (1) tablet of your 10mg -white warfarin tablets on Mondays, Wednesdays and Fridays. All OTHER days--take only one-half (1/2) of your 10mg -white warfarin tablets.  Patient instructed to call Pharmacist Jorene Guest with any signs/sypmtoms of bleeding or with any new medication or discontinuation of any medication by any provider you see.  Patient verbalized understanding of these instructions.    Patient advised to  contact clinic or seek medical attention if signs/symptoms of bleeding or thromboembolism occur.  Patient verbalized understanding by repeating back information and was advised to contact me if further medication-related questions arise. Patient was also provided an information handout.  Follow-up Return in 4 weeks (on 07/24/2019) for Follow up INR.  Dorene Grebe Groce  15 minutes spent face-to-face with the patient during the encounter. 50% of time spent on education, including signs/sx bleeding and clotting, as well as food and drug interactions with warfarin. 50% of time was spent on fingerprick POC INR sample collection,processing, results determination, and documentation in [email or web address].[email or web address]/.

## 2019-06-26 NOTE — Telephone Encounter (Signed)
A biopsy from ENT, I have sent a message to Dr. Benjamine Mola. thanks

## 2019-06-27 ENCOUNTER — Telehealth: Payer: Self-pay | Admitting: Hematology

## 2019-06-27 NOTE — Telephone Encounter (Signed)
Returned patient's phone call regarding rescheduling 12/02 appointment, left a voicemail.

## 2019-06-28 ENCOUNTER — Other Ambulatory Visit: Payer: Self-pay | Admitting: Hematology

## 2019-06-28 ENCOUNTER — Other Ambulatory Visit: Payer: Self-pay

## 2019-06-28 ENCOUNTER — Telehealth: Payer: Self-pay | Admitting: Hematology

## 2019-06-28 ENCOUNTER — Ambulatory Visit: Payer: Medicaid Other | Admitting: Rehabilitation

## 2019-06-28 ENCOUNTER — Encounter: Payer: Self-pay | Admitting: Rehabilitation

## 2019-06-28 ENCOUNTER — Telehealth: Payer: Self-pay | Admitting: *Deleted

## 2019-06-28 DIAGNOSIS — I890 Lymphedema, not elsewhere classified: Secondary | ICD-10-CM

## 2019-06-28 DIAGNOSIS — Z85819 Personal history of malignant neoplasm of unspecified site of lip, oral cavity, and pharynx: Secondary | ICD-10-CM | POA: Diagnosis not present

## 2019-06-28 DIAGNOSIS — C090 Malignant neoplasm of tonsillar fossa: Secondary | ICD-10-CM

## 2019-06-28 DIAGNOSIS — R293 Abnormal posture: Secondary | ICD-10-CM

## 2019-06-28 DIAGNOSIS — J392 Other diseases of pharynx: Secondary | ICD-10-CM

## 2019-06-28 NOTE — Telephone Encounter (Signed)
Patient called to reschedule 12/2 lab/fu. Per patient he had his scan a few days ago. Confirmed 12/16 lab/fu with patient.

## 2019-06-28 NOTE — Telephone Encounter (Signed)
Oncology Nurse Navigator Documentation  Spoke with Vira Agar, ENT Dr. Deeann Saint office.  She confirmed pt has appt this afternoon 1:20, I noted 12/7 CT Neck available for review.  Gayleen Orem, RN, BSN Head & Neck Oncology Nurse [place] [facility] [phone]

## 2019-06-28 NOTE — Therapy (Signed)
[place], [place], [address] Phone: [phone]   Fax:  [phone]  Physical Therapy Treatment  Patient Details  Name: [name] [name] MRN: [id number] Date of Birth: 1960-08-05 Referring Provider (PT): Reita May Date: 06/28/2019  PT End of Session - 06/28/19 1653    Visit Number  11    Number of Visits  74   MD approval not medicaid   Date for PT Re-Evaluation  07/26/19   MD approval not medicaid   Authorization Type  pending visit approval after 11/15, approved for 4 additional visits until 12/14    Authorization - Visit Number  3    Authorization - Number of Visits  4    PT Start Time  1600    PT Stop Time  5329    PT Time Calculation (min)  45 min    Activity Tolerance  Patient tolerated treatment well    Behavior During Therapy  [facility] for tasks assessed/performed       Past Medical History:  Diagnosis Date  . 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 Benign essential HTN 01/04/2012  . Cancer ([place])    tonsilar  . Chronic anticoagulation 01/02/2013  . Dermatofibroma of forearm 01/02/2013   Left side  . Hyperlipidemia, mixed 01/04/2012  . 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 Primary hypercoagulable state ([place]) 10/01/2014  . Sinus bradycardia, chronic 01/04/2012  . Superficial thrombosis of lower extremity 05/02/2012    Past Surgical History:  Procedure Laterality Date  . DIRECT LARYNGOSCOPY Left 10/19/2018   Procedure: DIRECT LARYNGOSCOPY;  Surgeon: Leta Baptist, MD;  Location: [facility];  Service: ENT;  Laterality: Left;  . IR IMAGING GUIDED PORT INSERTION  11/04/2018  . IR REMOVAL TUN ACCESS W/ PORT W/O FL MOD SED  05/01/2019  . TONSILLECTOMY Left 10/19/2018   Procedure: BIOPSY OF LEFT TONSIL;  Surgeon:  Leta Baptist, MD;  Location: [facility];  Service: ENT;  Laterality: Left;    There were no vitals filed for this visit.  Subjective Assessment - 06/28/19 1604    Subjective  I had a CT Monday and it was all clear.  Saw Dr. Benjamine Mola today.  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  Yes    Pain Score  3     Pain Location  Throat    Pain Orientation  Left            LYMPHEDEMA/ONCOLOGY QUESTIONNAIRE - 06/28/19 1640      Head and Neck   4 cm superior to sternal notch around neck  46 cm    6 cm superior to sternal notch around neck  48.6 cm    8 cm superior to sternal notch around neck  48.8 cm    Other  --   from tragus to 8cm sup to sternal notch mark : Lt 13.6, R:16               [facility] Adult PT Treatment/Exercise - 06/28/19 0001      Manual Therapy   Manual therapy comments  remeasured pt  to capture new lateral neck inferior to the ear swelling    Edema Management  discussed not using the flexitouch until the rep is able to officially set it up for him due to increased swelling on the left    Soft tissue mobilization  to the left SCM, scalenes, masseter, and pterygoids    Manual Lymphatic Drainage (MLD)  in supine head of bed elevated: 5 diaphragmatic breaths, short neck, bilateral axillary nodes, bilateral pectoral nodes, posterior, lateral and anterior neck moving fluid towards pathways                   PT Long Term Goals - 06/01/19 1614      PT LONG TERM GOAL #1   Title  Pt will be able to independently manage lymphedema through self MLD and compression garments    Status  Partially Met      PT LONG TERM GOAL #2   Title  Pt will receive appropriate compression garments for long term management of lymphedema    Status  Partially Met      PT LONG TERM GOAL #3    Title  Pt will reduce swelling at 8 cm superior to sternal notch by 2 cm to decrease risk of swelling.    Baseline  50.2 cm, 04/25/19- 50.4 but rest of neck demonstrates around 1.5 cm reduction, 06/01/19: 49.1    Status  Partially Met            Plan - 06/28/19 1655    Clinical Impression Statement  Pt arrives today after MD visit regarding new pain and difficulty opening his mouth.  His CT scan showing new diffuse swelling especially in the area of his tonsillary tumor.  Also with salivary gland swelling.  He was given antibiotics he reports since he mentioned an occasional ear ache but may be due to new TMJ issues/trismus.  Made some new measurements today to caputure under the ear swelling and this appears increased.  Pt also with tenderness to palpation of the left submandibular gland and tightness here with mouth opening.  Pt will no use his pump until a flexitouch rep has set it up to see if it decreases some of the fluid movement laterally.    PT Frequency  1x / week    PT Duration  4 weeks    PT Treatment/Interventions  ADLs/Self Care Home Management;Therapeutic exercise;Manual lymph drainage;Manual techniques;Compression bandaging;Taping    PT Next Visit Plan  get flexitouch set up? any better without using flexitouch? STM To left neck, SCM and jaw muscles, continue MLD neck and neck anterior pathway.  More visits most likely,    Consulted and Agree with Plan of Care  Patient       Patient will benefit from skilled therapeutic intervention in order to improve the following deficits and impairments:     Visit Diagnosis: Lymphedema, not elsewhere classified  Abnormal posture     Problem List Patient Active Problem List   Diagnosis Date Noted  . Warfarin anticoagulation 02/20/2019  . Hypercholesteremia 02/07/2019  . Recurrent deep vein thrombosis (DVT) ([place]) 02/06/2019  . Thoracic ascending aortic aneurysm ([place]) 02/06/2019  . Port-A-Cath in place 01/18/2019  . Mucositis  due to chemotherapy 12/07/2018  . Hypomagnesemia 12/07/2018  . Thrush 12/07/2018  . Malignant neoplasm of tonsillar fossa ([place]) 10/17/2018  . 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 DVT, lower extremity, proximal ([place]) 05/24/2013  . Chronic  anticoagulation 01/02/2013  . Superficial thrombosis of lower extremity 05/02/2012  . Benign essential HTN 01/04/2012  . Hyperlipidemia, mixed 01/04/2012    [name] 06/28/2019, 5:21 PM  [place], [place], [address] Phone: [phone]   Fax:  [phone]  Name: [name] [name] MRN: [id number] Date of Birth: 05-Apr-1961

## 2019-06-29 ENCOUNTER — Telehealth: Payer: Self-pay | Admitting: Hematology

## 2019-06-29 NOTE — Telephone Encounter (Signed)
Patient called to cx 12/16 lab/fu and does not wish to reschedule at this time.

## 2019-06-29 NOTE — Telephone Encounter (Signed)
Reschedule to March 2021 if the patient is willing to come then.  Thanks.  Dr. Maylon Peppers

## 2019-06-30 ENCOUNTER — Telehealth: Payer: Self-pay | Admitting: Hematology

## 2019-06-30 LAB — NICHOLS-CHANTILLY MISCELLANEOUS ORDER: PRICE:: 127.08

## 2019-06-30 NOTE — Telephone Encounter (Signed)
Scheduled per staff message, patient Is aware of upcoming date and time.

## 2019-07-03 ENCOUNTER — Ambulatory Visit (INDEPENDENT_AMBULATORY_CARE_PROVIDER_SITE_OTHER): Payer: Medicaid Other | Admitting: Internal Medicine

## 2019-07-03 ENCOUNTER — Encounter: Payer: Self-pay | Admitting: Internal Medicine

## 2019-07-03 ENCOUNTER — Other Ambulatory Visit: Payer: Self-pay

## 2019-07-03 ENCOUNTER — Telehealth: Payer: Self-pay | Admitting: *Deleted

## 2019-07-03 ENCOUNTER — Ambulatory Visit: Payer: Medicaid Other

## 2019-07-03 DIAGNOSIS — B370 Candidal stomatitis: Secondary | ICD-10-CM | POA: Diagnosis not present

## 2019-07-03 DIAGNOSIS — R293 Abnormal posture: Secondary | ICD-10-CM | POA: Diagnosis not present

## 2019-07-03 DIAGNOSIS — I890 Lymphedema, not elsewhere classified: Secondary | ICD-10-CM | POA: Diagnosis not present

## 2019-07-03 NOTE — Telephone Encounter (Signed)
RN spoke with patient. Rescheduled for evisit 12/14 at his request, ok with Dr Linus Salmons.

## 2019-07-03 NOTE — Telephone Encounter (Signed)
-----   Message from Thayer Headings, MD sent at 07/03/2019 10:34 AM EST ----- Can you let him know that his fungal thrush culture is now growing a fungus.  In order to treat this, it would need to be IV medication.  Have him return to see me to consider this option and then I can make arrangements for this when he returns.    thanks

## 2019-07-03 NOTE — Progress Notes (Signed)
   Subjective:    Patient ID: [name] [name], male    DOB: July 07, 1961, 58 y.o.   MRN: [id number]  HPI I connected with  [name] [name] on 07/03/19 by phone and verified that I am speaking with the correct person using two identifiers.   I discussed the limitations of evaluation and management by telemedicine. The patient expressed understanding and agreed to proceed.  Cultures from his mouth swab now positive for Candida dubliniensis and Rhodotorula mucilaginosa, both with heavy growth.  I discussed with him that these are not typical organisms and are very difficult to treat and not typically responsive to oral azoles such as fluconazole or voriconazole and did not respond to [place].  I discussed the only good option for treatment of this is with amphotericin/AMbisome. I discussed the need to give IV boluses and electrolyte management.  He would like to proceed so will [facility]. Will likely do M-F and he will flush on weekends. Will monitor his labs and he has K and Mag pills at home already, if needed.    25 minutes spent on the call   Review of Systems     Objective:   Physical Exam        Assessment & Plan:

## 2019-07-03 NOTE — Telephone Encounter (Signed)
RN called patient's home number, his wife answered. He is currently at another appointment. RN relayed Dr Henreitta Leber message and offered appointment for 12/15 at 11:30.  Patient's wife tentatively accepted the appointment, will have patient call back to confirm. Landis Gandy, RN

## 2019-07-03 NOTE — Therapy (Signed)
[place], [place], [address] Phone: [phone]   Fax:  [phone]  Physical Therapy Treatment  Patient Details  Name: [name] [name] MRN: [id number] Date of Birth: 08/17/1960 Referring Provider (PT): Reita May Date: 07/03/2019  PT End of Session - 07/03/19 1105    Visit Number  12    Number of Visits  41   MD approval, not medicaid   Date for PT Re-Evaluation  07/26/19   MD approval, not medicaid   Authorization Type  pending visit approval after 11/15, approved for 4 additional visits until 12/14    Authorization - Visit Number  4    Authorization - Number of Visits  4    PT Start Time  1004    PT Stop Time  1102    PT Time Calculation (min)  58 min    Activity Tolerance  Patient tolerated treatment well    Behavior During Therapy  [facility] for tasks assessed/performed       Past Medical History:  Diagnosis Date  . 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 Benign essential HTN 01/04/2012  . Cancer ([place])    tonsilar  . Chronic anticoagulation 01/02/2013  . Dermatofibroma of forearm 01/02/2013   Left side  . Hyperlipidemia, mixed 01/04/2012  . 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 Primary hypercoagulable state ([place]) 10/01/2014  . Sinus bradycardia, chronic 01/04/2012  . Superficial thrombosis of lower extremity 05/02/2012    Past Surgical History:  Procedure Laterality Date  . DIRECT LARYNGOSCOPY Left 10/19/2018   Procedure: DIRECT LARYNGOSCOPY;  Surgeon: Leta Baptist, MD;  Location: [facility];  Service: ENT;  Laterality: Left;  . IR IMAGING GUIDED PORT INSERTION  11/04/2018  . IR REMOVAL TUN ACCESS W/ PORT W/O FL MOD SED  05/01/2019  . TONSILLECTOMY Left 10/19/2018   Procedure: BIOPSY OF LEFT TONSIL;   Surgeon: Leta Baptist, MD;  Location: [facility];  Service: ENT;  Laterality: Left;    There were no vitals filed for this visit.  Subjective Assessment - 07/03/19 1009    Subjective  Nothing new since I was here last except I started taking an antibiotic Friday so I think that has also helped bring the swelling down some over the weekend. Still need to call [employer] and make an appt for them to come out.    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  Yes    Pain Score  3     Pain Location  Neck    Pain Orientation  Left    Pain Descriptors / Indicators  Sore   "sore throat"   Pain Type  Acute pain    Pain Onset  More than a month ago    Pain Frequency  Constant    Aggravating Factors   jaw gets stiff at random times throughout day for past 1-2 weeks    Pain Relieving Factors  after therapy it's a little looser and chip pack and wearing garment helps some also         [facility]  PT Assessment - 07/03/19 0001      AROM   Cervical Flexion  65    Cervical Extension  44    Cervical - Right Side Bend  47    Cervical - Left Side Bend  25    Cervical - Right Rotation  59    Cervical - Left Rotation  59        LYMPHEDEMA/ONCOLOGY QUESTIONNAIRE - 07/03/19 1020      Head and Neck   4 cm superior to sternal notch around neck  42.7 cm    6 cm superior to sternal notch around neck  42.6 cm    8 cm superior to sternal notch around neck  42.8 cm    Other  From tragus to 8 cm superior to sternal notch Rt 15.5 cm, Lt 13 cm                [facility] Adult PT Treatment/Exercise - 07/03/19 0001      Manual Therapy   Manual Therapy  Passive ROM    Soft tissue mobilization  to the left SCM, scalenes, masseter, and pterygoids    Manual Lymphatic Drainage (MLD)  in supine head of  bed elevated: 5 diaphragmatic breaths, short neck, bilateral axillary nodes, bilateral pectoral nodes, posterior, lateral and anterior neck moving fluid towards pathways     Passive ROM  During MLD and STM into bil cervical side bending and rotation, more to Rt as Lt sided tightness limits this ROM                  PT Long Term Goals - 07/03/19 1014      PT LONG TERM GOAL #1   Title  Pt will be able to independently manage lymphedema through self MLD and compression garments    Baseline  04/25/19- pt to get measured for compression garments next week, he is still learning self MLD; pt doing self MLD a few times/day and wearing garment(s) daily-07/03/19    Status  Achieved      PT LONG TERM GOAL #2   Title  Pt will receive appropriate compression garments for long term management of lymphedema    Baseline  04/25/19- pt will be measured for compression garments next week; wearing this daily now for 2-3 hours at a time-07/03/19    Status  Achieved      PT LONG TERM GOAL #3   Title  Pt will reduce swelling at 8 cm superior to sternal notch by 2 cm to decrease risk of swelling.    Baseline  50.2 cm, 04/25/19- 50.4 but rest of neck demonstrates around 1.5 cm reduction, 06/01/19: 49.1; 42.5 cm - 07/03/19    Status  Achieved            Plan - 07/03/19 1234    Clinical Impression Statement  Though pt reports not noticing if is swelling is much improved, his circumference measurements were drastically reduced today. Upon having him palpate and asking him questions he is able to reports noticing reduction since start of care, and also has now had 6/14 doses of antibitotic he was put on end of last week. He does still demonstrate difficulty and limitation with opening his jaw and reports pain 3-4/10 at end ROM that is enough to make him stop eating. As he was c/o of increased pain/discomfort at Lt neck area remeasured his cervical A/ROM and this is limited as well. Pt would like to continue  with PT to address  these new defecits but will wait until the New Year. Will route note to Allyson Sabal, PT for renewal as last note indicated such and due to pt does report that he has some temporary relief from his symptoms after manual therapy. Pt also reports he has yet to call for an appointment for [employer] to come set up pump but plans to this week.    Personal Factors and Comorbidities  Comorbidity 1    Comorbidities  history of multiple DVT and PE    Stability/Clinical Decision Making  Stable/Uncomplicated    Rehab Potential  Good    PT Frequency  1x / week    PT Duration  4 weeks    PT Treatment/Interventions  ADLs/Self Care Home Management;Therapeutic exercise;Manual lymph drainage;Manual techniques;Compression bandaging;Taping    PT Next Visit Plan  Renewal done? get flexitouch set up? any better without using flexitouch? STM To left neck, SCM and jaw muscles, continue MLD neck and neck anterior pathway.    PT Home Exercise Plan  wear head and neck garment, self MLD    Consulted and Agree with Plan of Care  Patient       Patient will benefit from skilled therapeutic intervention in order to improve the following deficits and impairments:  Postural dysfunction, Decreased strength, Increased edema, Decreased knowledge of precautions  Visit Diagnosis: Lymphedema, not elsewhere classified  Abnormal posture     Problem List Patient Active Problem List   Diagnosis Date Noted  . Warfarin anticoagulation 02/20/2019  . Hypercholesteremia 02/07/2019  . Recurrent deep vein thrombosis (DVT) ([place]) 02/06/2019  . Thoracic ascending aortic aneurysm ([place]) 02/06/2019  . Port-A-Cath in place 01/18/2019  . Mucositis due to chemotherapy 12/07/2018  . Hypomagnesemia 12/07/2018  . Thrush 12/07/2018  . Malignant neoplasm of tonsillar fossa ([place]) 10/17/2018  . 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 DVT,  lower extremity, proximal ([place]) 05/24/2013  . Chronic anticoagulation 01/02/2013  . Superficial thrombosis of lower extremity 05/02/2012  . Benign essential HTN 01/04/2012  . Hyperlipidemia, mixed 01/04/2012    Otelia Limes, PTA 07/03/2019, 12:49 PM  [place], [place], [address] Phone: [phone]   Fax:  [phone]  Name: [name] [name] MRN: [id number] Date of Birth: 04-14-1961

## 2019-07-04 ENCOUNTER — Ambulatory Visit: Payer: Medicaid Other | Admitting: Internal Medicine

## 2019-07-04 ENCOUNTER — Telehealth: Payer: Self-pay

## 2019-07-04 NOTE — Telephone Encounter (Signed)
PICC appointment scheduled for Monday 12/21 @ 2pm. Called to inform patient of appointment and arrival time (1:45pm); left voicemail. Asked patient to call back to confirm he received message. Also informed patient that we would be scheduling his infusion therapy appointments (14 doses.)  Carlean Purl, RN

## 2019-07-04 NOTE — Telephone Encounter (Signed)
First dose scheduled at Patient [name] [name], 12/22 @ 8am. Informed that patient has to schedule remainder. Patient notified via MyChart and VM of updates.   Brinley Rosete Lorita Officer, RN

## 2019-07-05 ENCOUNTER — Telehealth: Payer: Self-pay | Admitting: *Deleted

## 2019-07-05 ENCOUNTER — Other Ambulatory Visit: Payer: Medicaid Other

## 2019-07-05 ENCOUNTER — Ambulatory Visit: Payer: Medicaid Other | Admitting: Hematology

## 2019-07-05 ENCOUNTER — Ambulatory Visit: Payer: Medicaid Other

## 2019-07-05 ENCOUNTER — Telehealth: Payer: Self-pay

## 2019-07-05 NOTE — Telephone Encounter (Signed)
Called to update the patient about upcoming scheduled appointments. Patient is scheduled for PICC placement now on 12/28 @ 2pm and is asked to arrive by 1:45PM.   His first dose of Ambisome is scheduled at [facility] on 12/29 @ 8am. They stated they do not do afternoon appointments and this was the first available. Patient was notified of this.   Patient was notified via MyChart of changes and asked to call with any questions or concerns.  [name] Stidham Lorita Officer, RN

## 2019-07-05 NOTE — Telephone Encounter (Signed)
Thanks. Can we get records from Dr. Benjamine Mola and see if he found anything suspicious?  Dr. Maylon Peppers

## 2019-07-05 NOTE — Telephone Encounter (Signed)
Oncology Nurse Navigator Documentation  In follow-up to this morning's ENT Conference review of pt's 12/7 CT Neck, called Dr. Deeann Saint office.  Spoke with Ebony Hail who confirmed Mr. Blankenburg was seen by Dr. Benjamine Mola 12/9, was aware of CT imaging.    Drs. Isidore Moos and Maylon Peppers updated.  Gayleen Orem, RN, BSN Head & Neck Oncology Nurse [facility] [phone]

## 2019-07-06 ENCOUNTER — Ambulatory Visit: Payer: Medicaid Other | Admitting: Physical Therapy

## 2019-07-10 ENCOUNTER — Ambulatory Visit (HOSPITAL_COMMUNITY): Payer: Medicaid Other

## 2019-07-11 ENCOUNTER — Ambulatory Visit (HOSPITAL_COMMUNITY): Payer: Medicaid Other

## 2019-07-12 ENCOUNTER — Telehealth: Payer: Self-pay

## 2019-07-12 NOTE — Telephone Encounter (Signed)
Patient wants to know if he should continue his Coumadin with PICC in place?   Please advise.    Laverle Patter, RN   Per Dr Megan Salon we do not advise patients to stop Coumadin while receiving IV antibiotics via PICC.   Laverle Patter, RN

## 2019-07-13 NOTE — Telephone Encounter (Signed)
Correct, we do not advise anything about coumadin. thanks

## 2019-07-17 ENCOUNTER — Ambulatory Visit (HOSPITAL_COMMUNITY)
Admission: RE | Admit: 2019-07-17 | Discharge: 2019-07-17 | Disposition: A | Discharge status: Home / Self Care | Payer: Medicaid Other | Source: Ambulatory Visit | Attending: Internal Medicine | Admitting: Internal Medicine

## 2019-07-17 ENCOUNTER — Other Ambulatory Visit: Payer: Self-pay | Admitting: Internal Medicine

## 2019-07-17 ENCOUNTER — Other Ambulatory Visit: Payer: Self-pay

## 2019-07-17 DIAGNOSIS — B370 Candidal stomatitis: Secondary | ICD-10-CM | POA: Diagnosis not present

## 2019-07-17 DIAGNOSIS — Z452 Encounter for adjustment and management of vascular access device: Secondary | ICD-10-CM | POA: Diagnosis not present

## 2019-07-17 MED ORDER — LIDOCAINE HCL 1 % IJ SOLN
INTRAMUSCULAR | Status: DC | PRN
  Administered 2019-07-17: 5 mL

## 2019-07-17 MED ORDER — LIDOCAINE HCL 1 % IJ SOLN
INTRAMUSCULAR | Status: AC
  Filled 2019-07-17: qty 20

## 2019-07-17 MED ORDER — HEPARIN SOD (PORK) LOCK FLUSH 100 UNIT/ML IV SOLN
INTRAVENOUS | Status: AC
  Filled 2019-07-17: qty 5

## 2019-07-18 ENCOUNTER — Telehealth: Payer: Self-pay

## 2019-07-18 ENCOUNTER — Ambulatory Visit (HOSPITAL_COMMUNITY)
Admission: RE | Admit: 2019-07-18 | Discharge: 2019-07-18 | Disposition: A | Discharge status: Home / Self Care | Payer: Medicaid Other | Source: Ambulatory Visit | Attending: Internal Medicine | Admitting: Internal Medicine

## 2019-07-18 DIAGNOSIS — B370 Candidal stomatitis: Secondary | ICD-10-CM | POA: Insufficient documentation

## 2019-07-18 LAB — MAGNESIUM: Magnesium: 2.1 mg/dL (ref 1.7–2.4)

## 2019-07-18 LAB — BASIC METABOLIC PANEL
Anion gap: 13 (ref 5–15)
BUN: 21 mg/dL — ABNORMAL HIGH (ref 6–20)
CO2: 26 mmol/L (ref 22–32)
Calcium: 9.4 mg/dL (ref 8.9–10.3)
Chloride: 99 mmol/L (ref 98–111)
Creatinine, Ser: 0.75 mg/dL (ref 0.61–1.24)
GFR calc Af Amer: >60 mL/min (ref >60)
GFR calc non Af Amer: >60 mL/min (ref >60)
Glucose, Bld: 106 mg/dL — ABNORMAL HIGH (ref 70–99)
Potassium: 3.7 mmol/L (ref 3.5–5.1)
Sodium: 138 mmol/L (ref 135–145)

## 2019-07-18 LAB — CBC
HCT: 44 % (ref 39.0–52.0)
Hemoglobin: 14.7 g/dL (ref 13.0–17.0)
MCH: 31.7 pg (ref 26.0–34.0)
MCHC: 33.4 g/dL (ref 30.0–36.0)
MCV: 95 fL (ref 80.0–100.0)
Platelets: 227 10*3/uL (ref 150–400)
RBC: 4.63 MIL/uL (ref 4.22–5.81)
RDW: 13.2 % (ref 11.5–15.5)
WBC: 6.3 10*3/uL (ref 4.0–10.5)
nRBC: 0 % (ref 0.0–0.2)

## 2019-07-18 MED ORDER — SODIUM CHLORIDE 0.9 % IV BOLUS FOR AMBISOME
500.0000 mL | INTRAVENOUS | Status: DC
  Administered 2019-07-18: 09:00:00 500 mL via INTRAVENOUS

## 2019-07-18 MED ORDER — SODIUM CHLORIDE 0.9% FLUSH
10.0000 mL | INTRAVENOUS | Status: AC | PRN
  Administered 2019-07-18: 13:00:00 10 mL

## 2019-07-18 MED ORDER — SODIUM CHLORIDE 0.9 % IV BOLUS FOR AMBISOME
500.0000 mL | INTRAVENOUS | Status: DC
  Administered 2019-07-18: 500 mL via INTRAVENOUS

## 2019-07-18 MED ORDER — DEXTROSE 5% FOR FLUSHING BEFORE AND AFTER AMBISOME
10.0000 mL | INTRAVENOUS | Status: DC
  Administered 2019-07-18: 10 mL via INTRAVENOUS
  Filled 2019-07-18: qty 50

## 2019-07-18 MED ORDER — HEPARIN SOD (PORK) LOCK FLUSH 100 UNIT/ML IV SOLN
250.0000 [IU] | INTRAVENOUS | Status: AC | PRN
  Administered 2019-07-18: 13:00:00 250 [IU]

## 2019-07-18 MED ORDER — DEXTROSE 5 % IV SOLN
350.0000 mg | INTRAVENOUS | Status: DC
  Administered 2019-07-18: 10:00:00 350 mg via INTRAVENOUS
  Filled 2019-07-18: qty 87.5

## 2019-07-18 NOTE — Telephone Encounter (Signed)
Contacted patient to schedule nurse visit for PICC line teaching. Patient is not receiving home health as he is going to the patient care center for his ambisome infusions. He will need to monitor and take care of his PICC on weekends when the offices are closed. Left HIPAA compliant voicemail asking patient to call back to schedule an office visit for teaching either on 12/30 or 12/31.   [name] Lorita Officer, RN

## 2019-07-18 NOTE — Discharge Instructions (Signed)
Amphotericin B Liposomal (LAmB) injection What is this medicine? AMPHOTERICIN (am foe TER i sin) B LIPOSOMAL INJECTION is an antifungal medicine. It is used to treat certain kinds of fungal or yeast infections. This medicine may be used for other purposes; ask your health care provider or pharmacist if you have questions. COMMON BRAND NAME(S): AmBisome What should I tell my health care provider before I take this medicine? They need to know if you have any of these conditions:  immune system problems  other chronic illness  an unusual or allergic reaction to amphotericin B, other medicines, foods, dyes or preservatives  pregnant or trying to get pregnant  breast-feeding How should I use this medicine? This medicine is for infusion into a vein. It is usually given by a health care professional in a hospital or clinic setting. If you get this medicine at home, you will be taught how to prepare and give this medicine. Use exactly as directed. Take your medicine at regular intervals. Do not take your medicine more often than directed. It is important that you put your used needles and syringes in a special sharps container. Do not put them in a trash can. If you do not have a sharps container, call your pharmacist or healthcare provider to get one. Talk to your pediatrician regarding the use of this medicine in children. Special care may be needed. Overdosage: If you think you have taken too much of this medicine contact a poison control center or emergency room at once. NOTE: This medicine is only for you. Do not share this medicine with others. What if I miss a dose? If you miss a dose, use it as soon as you can. If it is almost time for your next dose, use only that dose. Do not use double or extra doses. What may interact with this medicine? Do not take this medicine with any of the following medications:  cidofovir This medicine may also interact with the following  medications:  certain antibiotics given by injection  corticotropin  cyclosporine  digoxin  entecavir  flucytosine  medicines for fungal infections  medicines to treat cancer  muscle relaxers  pentamidine  steroid medicines like prednisone or cortisone This list may not describe all possible interactions. Give your health care provider a list of all the medicines, herbs, non-prescription drugs, or dietary supplements you use. Also tell them if you smoke, drink alcohol, or use illegal drugs. Some items may interact with your medicine. What should I watch for while using this medicine? Your condition will be monitored carefully while you are receiving this medicine. Tell your doctor or health care professional if your symptoms do not improve or if you get new symptoms. What side effects may I notice from receiving this medicine? Side effects that you should report to your doctor or health care professional as soon as possible:  allergic reactions like skin rash or itching, hives, swelling of the lips, mouth, tongue, or throat  anxiety, confusion  breathing trouble  burning, numbness, tingling  change in amount or color of urine  change in hearing, vision  chest tightness  fever, chills, infection  irregular heart beat, blood pressure  seizure  unusual bleeding, bruising  unusually weak or tired Side effects that usually do not require medical attention (report to your doctor or health care professional if they continue or are bothersome):  body aches, pain  cough  diarrhea  headache  flushing, redness of skin  nausea, vomiting  pain where injected  stomach pain  trouble sleeping This list may not describe all possible side effects. Call your doctor for medical advice about side effects. You may report side effects to FDA at 1-800-FDA-1088. Where should I keep my medicine? Keep out of the reach of children. If you are using this medicine at home,  you will be instructed on how to store this medicine. Throw away any unused medicine after the expiration date on the label. NOTE: This sheet is a summary. It may not cover all possible information. If you have questions about this medicine, talk to your doctor, pharmacist, or health care provider.  2020 Elsevier/Gold Standard (2007-12-20 10:47:54)

## 2019-07-18 NOTE — Progress Notes (Signed)
PATIENT CARE CENTER NOTE  Diagnosis: Thrush; Fungal Infection (B37.0)   Provider: Scharlene Gloss, MD   Procedure: Ambisome infusion   Note: Patient received Ambisome via right arm PICC. 500 cc bolus given pre and post-infusion per order. Patient tolerated infusion well. Vital signs stable.  Discharge instructions given. Patient is to receive a total of 14 doses. Will return tomorrow for second dose. Alert, oriented and ambulatory at discharge.

## 2019-07-19 ENCOUNTER — Telehealth: Payer: Self-pay | Admitting: Internal Medicine

## 2019-07-19 ENCOUNTER — Ambulatory Visit (HOSPITAL_COMMUNITY)
Admission: RE | Admit: 2019-07-19 | Discharge: 2019-07-19 | Disposition: A | Discharge status: Home / Self Care | Payer: Medicaid Other | Source: Ambulatory Visit | Attending: Internal Medicine | Admitting: Internal Medicine

## 2019-07-19 ENCOUNTER — Other Ambulatory Visit: Payer: Self-pay

## 2019-07-19 DIAGNOSIS — B370 Candidal stomatitis: Secondary | ICD-10-CM | POA: Diagnosis not present

## 2019-07-19 MED ORDER — DEXTROSE 5% FOR FLUSHING BEFORE AND AFTER AMBISOME
10.0000 mL | INTRAVENOUS | Status: DC
  Administered 2019-07-19: 10 mL via INTRAVENOUS
  Filled 2019-07-19: qty 50

## 2019-07-19 MED ORDER — SODIUM CHLORIDE 0.9 % IV BOLUS FOR AMBISOME
500.0000 mL | INTRAVENOUS | Status: DC
  Administered 2019-07-19: 500 mL via INTRAVENOUS

## 2019-07-19 MED ORDER — HEPARIN SOD (PORK) LOCK FLUSH 100 UNIT/ML IV SOLN
250.0000 [IU] | INTRAVENOUS | Status: AC | PRN
  Administered 2019-07-19: 250 [IU]
  Filled 2019-07-19: qty 5

## 2019-07-19 MED ORDER — DEXTROSE 5 % IV SOLN
350.0000 mg | INTRAVENOUS | Status: DC
  Administered 2019-07-19: 350 mg via INTRAVENOUS
  Filled 2019-07-19: qty 87.5

## 2019-07-19 MED ORDER — SODIUM CHLORIDE 0.9% FLUSH
10.0000 mL | INTRAVENOUS | Status: AC | PRN
  Administered 2019-07-19: 10 mL

## 2019-07-19 NOTE — Discharge Instructions (Signed)
Amphotericin B Liposomal (LAmB) injection What is this medicine? AMPHOTERICIN (am foe TER i sin) B LIPOSOMAL INJECTION is an antifungal medicine. It is used to treat certain kinds of fungal or yeast infections. This medicine may be used for other purposes; ask your health care provider or pharmacist if you have questions. COMMON BRAND NAME(S): AmBisome What should I tell my health care provider before I take this medicine? They need to know if you have any of these conditions:  immune system problems  other chronic illness  an unusual or allergic reaction to amphotericin B, other medicines, foods, dyes or preservatives  pregnant or trying to get pregnant  breast-feeding How should I use this medicine? This medicine is for infusion into a vein. It is usually given by a health care professional in a hospital or clinic setting. If you get this medicine at home, you will be taught how to prepare and give this medicine. Use exactly as directed. Take your medicine at regular intervals. Do not take your medicine more often than directed. It is important that you put your used needles and syringes in a special sharps container. Do not put them in a trash can. If you do not have a sharps container, call your pharmacist or healthcare provider to get one. Talk to your pediatrician regarding the use of this medicine in children. Special care may be needed. Overdosage: If you think you have taken too much of this medicine contact a poison control center or emergency room at once. NOTE: This medicine is only for you. Do not share this medicine with others. What if I miss a dose? If you miss a dose, use it as soon as you can. If it is almost time for your next dose, use only that dose. Do not use double or extra doses. What may interact with this medicine? Do not take this medicine with any of the following medications:  cidofovir This medicine may also interact with the following  medications:  certain antibiotics given by injection  corticotropin  cyclosporine  digoxin  entecavir  flucytosine  medicines for fungal infections  medicines to treat cancer  muscle relaxers  pentamidine  steroid medicines like prednisone or cortisone This list may not describe all possible interactions. Give your health care provider a list of all the medicines, herbs, non-prescription drugs, or dietary supplements you use. Also tell them if you smoke, drink alcohol, or use illegal drugs. Some items may interact with your medicine. What should I watch for while using this medicine? Your condition will be monitored carefully while you are receiving this medicine. Tell your doctor or health care professional if your symptoms do not improve or if you get new symptoms. What side effects may I notice from receiving this medicine? Side effects that you should report to your doctor or health care professional as soon as possible:  allergic reactions like skin rash or itching, hives, swelling of the lips, mouth, tongue, or throat  anxiety, confusion  breathing trouble  burning, numbness, tingling  change in amount or color of urine  change in hearing, vision  chest tightness  fever, chills, infection  irregular heart beat, blood pressure  seizure  unusual bleeding, bruising  unusually weak or tired Side effects that usually do not require medical attention (report to your doctor or health care professional if they continue or are bothersome):  body aches, pain  cough  diarrhea  headache  flushing, redness of skin  nausea, vomiting  pain where injected  stomach pain  trouble sleeping This list may not describe all possible side effects. Call your doctor for medical advice about side effects. You may report side effects to FDA at 1-800-FDA-1088. Where should I keep my medicine? Keep out of the reach of children. If you are using this medicine at home,  you will be instructed on how to store this medicine. Throw away any unused medicine after the expiration date on the label. NOTE: This sheet is a summary. It may not cover all possible information. If you have questions about this medicine, talk to your doctor, pharmacist, or health care provider.  2020 Elsevier/Gold Standard (2007-12-20 10:47:54)

## 2019-07-19 NOTE — Telephone Encounter (Signed)
Pt contact pt [phone]

## 2019-07-19 NOTE — Progress Notes (Signed)
  Diagnosis: Thrush, fungal infection   Provider: Scharlene Gloss, MD   Procedure: Ambisome infusion   Note:  Patient received Ambisome through right PICC line. 500 cc normal saline bolus given pre and post transfusion. PICC accessed and de-accessed with 250cc of heparin and 10 cc of normal saline.Tolerated well, vitals stable, discharge instructions given, verbalized understanding. Patient alert, oriented and ambulatory at the time of discharge. Alert, oriented and ambulatory at discharge.

## 2019-07-20 ENCOUNTER — Telehealth: Payer: Self-pay | Admitting: *Deleted

## 2019-07-20 ENCOUNTER — Ambulatory Visit (HOSPITAL_COMMUNITY)
Admission: RE | Admit: 2019-07-20 | Discharge: 2019-07-20 | Disposition: A | Discharge status: Home / Self Care | Payer: Medicaid Other | Source: Ambulatory Visit | Attending: Internal Medicine | Admitting: Internal Medicine

## 2019-07-20 DIAGNOSIS — B370 Candidal stomatitis: Secondary | ICD-10-CM | POA: Diagnosis not present

## 2019-07-20 LAB — BASIC METABOLIC PANEL
Anion gap: 12 (ref 5–15)
BUN: 12 mg/dL (ref 6–20)
CO2: 26 mmol/L (ref 22–32)
Calcium: 9.3 mg/dL (ref 8.9–10.3)
Chloride: 100 mmol/L (ref 98–111)
Creatinine, Ser: 0.72 mg/dL (ref 0.61–1.24)
GFR calc Af Amer: >60 mL/min (ref >60)
GFR calc non Af Amer: >60 mL/min (ref >60)
Glucose, Bld: 100 mg/dL — ABNORMAL HIGH (ref 70–99)
Potassium: 3.6 mmol/L (ref 3.5–5.1)
Sodium: 138 mmol/L (ref 135–145)

## 2019-07-20 LAB — CBC
HCT: 41.7 % (ref 39.0–52.0)
Hemoglobin: 13.9 g/dL (ref 13.0–17.0)
MCH: 31.5 pg (ref 26.0–34.0)
MCHC: 33.3 g/dL (ref 30.0–36.0)
MCV: 94.6 fL (ref 80.0–100.0)
Platelets: 218 10*3/uL (ref 150–400)
RBC: 4.41 MIL/uL (ref 4.22–5.81)
RDW: 13 % (ref 11.5–15.5)
WBC: 5.7 10*3/uL (ref 4.0–10.5)
nRBC: 0 % (ref 0.0–0.2)

## 2019-07-20 LAB — MAGNESIUM: Magnesium: 1.9 mg/dL (ref 1.7–2.4)

## 2019-07-20 MED ORDER — DEXTROSE 5% FOR FLUSHING BEFORE AND AFTER AMBISOME
10.0000 mL | INTRAVENOUS | Status: DC
  Administered 2019-07-20: 10 mL via INTRAVENOUS
  Filled 2019-07-20: qty 10

## 2019-07-20 MED ORDER — SODIUM CHLORIDE 0.9 % IV BOLUS FOR AMBISOME
500.0000 mL | INTRAVENOUS | Status: DC
  Administered 2019-07-20: 500 mL via INTRAVENOUS

## 2019-07-20 MED ORDER — HEPARIN SOD (PORK) LOCK FLUSH 100 UNIT/ML IV SOLN
500.0000 [IU] | INTRAVENOUS | Status: AC | PRN
  Administered 2019-07-20: 250 [IU]

## 2019-07-20 MED ORDER — DEXTROSE 5 % IV SOLN
350.0000 mg | INTRAVENOUS | Status: DC
  Administered 2019-07-20: 350 mg via INTRAVENOUS
  Filled 2019-07-20: qty 87.5

## 2019-07-20 MED ORDER — SODIUM CHLORIDE 0.9% FLUSH
10.0000 mL | INTRAVENOUS | Status: AC | PRN
  Administered 2019-07-20: 10 mL

## 2019-07-20 NOTE — Telephone Encounter (Signed)
Spoke with [name] [name], patient's girl friend.  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 He is drinking 2 carnation instant breakfasts/day, is able to drink water and swallow pills, but is not able to eat food.  They are wondering if the thrush could have been impacting this.    He is also is wondering about the mucous-filled cyst in his sinus, found on recent CT - is wondering if this is connected to the thrush.  They will speak with his cancer team as well.  RN advised [name] [name] to push fluids over the weekend, to make sure he is well-hydrated and eat/drink well while he is on the amphotericin infusions.  [name] Gandy, RN

## 2019-07-20 NOTE — Progress Notes (Signed)
PATIENT CARE CENTER NOTE  Diagnosis: Thrush; Fungal Infection (B37.0)   Provider: Scharlene Gloss, MD   Procedure: Ambisome infusion   Note: Patient received third dose of Ambisome via right arm PICC. 500 cc bolus given pre and post-infusion per order. Patient tolerated infusion well. Vital signs stable.  Discharge instructions given. Patient is to receive a total of 14 doses.  Alert, oriented and ambulatory at discharge.

## 2019-07-20 NOTE — Discharge Instructions (Signed)
Amphotericin B for infusion What is this medicine? AMPHOTERICIN B (am foe TER i sin) is an antifungal medicine. It is used to treat certain kinds of fungal or yeast infections. This medicine may be used for other purposes; ask your health care provider or pharmacist if you have questions. COMMON BRAND NAME(S): Amphocin, Fungizone What should I tell my health care provider before I take this medicine? They need to know if you have any of these conditions:  heart disease  kidney disease  recent medical procedures  an unusual or allergic reaction to amphotericin B, other medicines, foods, dyes or preservatives  pregnant or trying to get pregnant  breast-feeding How should I use this medicine? This medicine is for infusion into a vein. It is usually given by a health care professional in a hospital or clinic setting. If you get this medicine at home, you will be taught how to prepare and give this medicine. Use exactly as directed. Take your medicine at regular intervals. Do not take your medicine more often than directed. It is important that you put your used needles and syringes in a special sharps container. Do not put them in a trash can. If you do not have a sharps container, call your pharmacist or healthcare provider to get one. Talk to your pediatrician regarding the use of this medicine in children. Special care may be needed. Overdosage: If you think you have taken too much of this medicine contact a poison control center or emergency room at once. NOTE: This medicine is only for you. Do not share this medicine with others. What if I miss a dose? If you miss a dose, use it as soon as you can. If it is almost time for your next dose, use only that dose. Do not use double or extra doses. What may interact with this medicine? Do not take this medicine with any of the following medications:  cidofovir This medicine may also interact with the following medications:  certain  antibiotics given by injection  corticotropin  cyclosporine  digoxin  flucytosine  medicines for fungal infections  medicines to treat cancer  muscle relaxers  pentamidine  steroid medicines like prednisone or cortisone This list may not describe all possible interactions. Give your health care provider a list of all the medicines, herbs, non-prescription drugs, or dietary supplements you use. Also tell them if you smoke, drink alcohol, or use illegal drugs. Some items may interact with your medicine. What should I watch for while using this medicine? Tell your doctor or health care professional if your symptoms do not improve or if you get new symptoms. Your condition and lab work will be monitored while you are taking this medicine. What side effects may I notice from receiving this medicine? Side effects that you should report to your doctor or health care professional as soon as possible:  allergic reactions like skin rash or itching, hives, swelling of the lips, mouth, tongue, or throat  burning, numbness, tingling  change in amount or color of urine  changes in hearing  changes in vision  chest tightness  fever, chills  irregular heart beat, blood pressure  redness, blistering, peeling or loosening of the skin, including inside the mouth  seizures  short of breath, wheezing  unusual bleeding, bruising  unusually weak or tired  yellowing of eyes, skin Side effects that usually do not require medical attention (report to your doctor or health care professional if they continue or are bothersome):  body aches,  pain  diarrhea  headache  flushing, redness of skin  loss of appetite  nausea, vomiting  pain where injected  stomach upset, pain  weight loss This list may not describe all possible side effects. Call your doctor for medical advice about side effects. You may report side effects to FDA at 1-800-FDA-1088. Where should I keep my  medicine? Keep out of the reach of children. If you are using this medicine at home, you will be instructed on how to store this medicine. Throw away any unused medicine after the expiration date on the label. NOTE: This sheet is a summary. It may not cover all possible information. If you have questions about this medicine, talk to your doctor, pharmacist, or health care provider.  2020 Elsevier/Gold Standard (2007-09-27 11:32:16)

## 2019-07-22 NOTE — Telephone Encounter (Signed)
Cyst not related to thrush and he will need to talk to his ENT about the lock jaw, this is not related to the thrush.

## 2019-07-24 ENCOUNTER — Ambulatory Visit (INDEPENDENT_AMBULATORY_CARE_PROVIDER_SITE_OTHER): Payer: Medicaid Other | Admitting: Pharmacist

## 2019-07-24 ENCOUNTER — Other Ambulatory Visit: Payer: Self-pay

## 2019-07-24 ENCOUNTER — Ambulatory Visit (HOSPITAL_COMMUNITY)
Admission: RE | Admit: 2019-07-24 | Discharge: 2019-07-24 | Disposition: A | Discharge status: Home / Self Care | Payer: Medicaid Other | Source: Ambulatory Visit | Attending: Internal Medicine | Admitting: Internal Medicine

## 2019-07-24 DIAGNOSIS — B370 Candidal stomatitis: Secondary | ICD-10-CM | POA: Diagnosis present

## 2019-07-24 DIAGNOSIS — Z7901 Long term (current) use of anticoagulants: Secondary | ICD-10-CM

## 2019-07-24 DIAGNOSIS — Z5181 Encounter for therapeutic drug level monitoring: Secondary | ICD-10-CM | POA: Diagnosis not present

## 2019-07-24 DIAGNOSIS — Z86718 Personal history of other venous thrombosis and embolism: Secondary | ICD-10-CM

## 2019-07-24 DIAGNOSIS — D6859 Other primary thrombophilia: Secondary | ICD-10-CM

## 2019-07-24 LAB — CBC
HCT: 42.4 % (ref 39.0–52.0)
Hemoglobin: 14.1 g/dL (ref 13.0–17.0)
MCH: 31.3 pg (ref 26.0–34.0)
MCHC: 33.3 g/dL (ref 30.0–36.0)
MCV: 94.2 fL (ref 80.0–100.0)
Platelets: 257 10*3/uL (ref 150–400)
RBC: 4.5 MIL/uL (ref 4.22–5.81)
RDW: 13.1 % (ref 11.5–15.5)
WBC: 6.8 10*3/uL (ref 4.0–10.5)
nRBC: 0 % (ref 0.0–0.2)

## 2019-07-24 LAB — BASIC METABOLIC PANEL
Anion gap: 11 (ref 5–15)
BUN: 15 mg/dL (ref 6–20)
CO2: 26 mmol/L (ref 22–32)
Calcium: 9.3 mg/dL (ref 8.9–10.3)
Chloride: 101 mmol/L (ref 98–111)
Creatinine, Ser: 0.98 mg/dL (ref 0.61–1.24)
GFR calc Af Amer: >60 mL/min (ref >60)
GFR calc non Af Amer: >60 mL/min (ref >60)
Glucose, Bld: 105 mg/dL — ABNORMAL HIGH (ref 70–99)
Potassium: 3 mmol/L — ABNORMAL LOW (ref 3.5–5.1)
Sodium: 138 mmol/L (ref 135–145)

## 2019-07-24 LAB — MAGNESIUM: Magnesium: 1.8 mg/dL (ref 1.7–2.4)

## 2019-07-24 LAB — POCT INR: INR: 1.9 — AB (ref 2.0–3.0)

## 2019-07-24 MED ORDER — HEPARIN SOD (PORK) LOCK FLUSH 100 UNIT/ML IV SOLN
250.0000 [IU] | INTRAVENOUS | Status: AC | PRN
  Administered 2019-07-24: 250 [IU]

## 2019-07-24 MED ORDER — SODIUM CHLORIDE 0.9 % IV BOLUS FOR AMBISOME
500.0000 mL | INTRAVENOUS | Status: DC
  Administered 2019-07-24: 11:00:00 500 mL via INTRAVENOUS

## 2019-07-24 MED ORDER — SODIUM CHLORIDE 0.9 % IV BOLUS FOR AMBISOME
500.0000 mL | INTRAVENOUS | Status: DC
  Administered 2019-07-24: 500 mL via INTRAVENOUS

## 2019-07-24 MED ORDER — DEXTROSE 5% FOR FLUSHING BEFORE AND AFTER AMBISOME
10.0000 mL | INTRAVENOUS | Status: DC
  Administered 2019-07-24: 10 mL via INTRAVENOUS
  Filled 2019-07-24: qty 50
  Filled 2019-07-24: qty 10
  Filled 2019-07-24: qty 50

## 2019-07-24 MED ORDER — SODIUM CHLORIDE 0.9% FLUSH
10.0000 mL | INTRAVENOUS | Status: AC | PRN
  Administered 2019-07-24: 10 mL

## 2019-07-24 MED ORDER — DEXTROSE 5 % IV SOLN
350.0000 mg | INTRAVENOUS | Status: DC
  Administered 2019-07-24: 12:00:00 350 mg via INTRAVENOUS
  Filled 2019-07-24: qty 87.5

## 2019-07-24 MED ORDER — DEXTROSE 5% FOR FLUSHING BEFORE AND AFTER AMBISOME
10.0000 mL | INTRAVENOUS | Status: DC
  Administered 2019-07-24: 12:00:00 10 mL via INTRAVENOUS
  Filled 2019-07-24 (×2): qty 50
  Filled 2019-07-24: qty 10

## 2019-07-24 NOTE — Patient Instructions (Signed)
Patient instructed to take medications as defined in the Anti-coagulation Track section of this encounter.  Patient instructed to take today's dose.  Patient instructed to take one (1) tablet of your 10mg -white warfarin tablets on Mondays, Tuesdays, Wednesdays and Fridays. All OTHER days--take only one-half (1/2) of your 10mg -white warfarin tablets.  Patient verbalized understanding of these instructions.

## 2019-07-24 NOTE — Progress Notes (Signed)
Anticoagulation Management [name] [name] is a 59 y.o. male who reports to the clinic for monitoring of warfarin treatment.    Indication: PE, History of (resolved); Long term current use of anticoagulant.  Duration: indefinite Supervising physician: Velna Ochs, MD  Anticoagulation Clinic Visit History: Patient does not report signs/symptoms of bleeding or thromboembolism  Other recent changes: No diet, medications, lifestyle except as noted in patient findings.  Anticoagulation Episode Summary    Current INR goal:  2.5-3.5  TTR:  33.1 % (8.1 y)  Next INR check:  08/14/2019  INR from last check:  1.9 (07/24/2019)  Weekly max warfarin dose:    Target end date:    INR check location:    Preferred lab:    Send INR reminders to:  [facility]   Indications   Pulmonary embolism ([place]) (Resolved) [I26.99]       Comments:        Anticoagulation Care Providers    Provider Role Specialty Phone number   Annia Belt, MD Referring Oncology [phone]      No Known Allergies  Current Outpatient Medications:  .  amLODipine (NORVASC) 10 MG tablet, Take 1 tablet (10 mg total) by mouth daily., Disp: 90 tablet, Rfl: 2 .  atorvastatin (LIPITOR) 20 MG tablet, Take 1 tablet (20 mg total) by mouth daily. (Patient taking differently: Take 20 mg by mouth as needed. Few times a week per pt.), Disp: 30 tablet, Rfl: 11 .  ibuprofen (ADVIL) 200 MG tablet, Take 600 mg by mouth every 6 (six) hours as needed for pain or fever. , Disp: , Rfl:  .  sodium fluoride (PREVIDENT 5000 PLUS) 1.1 % CREA dental cream, Apply to tooth brush. Brush teeth for 2 minutes. Spit out excess. DO NOT rinse afterwards. Repeat nightly., Disp: 1 Tube, Rfl: prn .  warfarin (COUMADIN) 10 MG tablet, Take 1 tablet of 10 mg warfarin daily except on Mondays, Wednesdays and Fridays, take only 1/2 tablet (5 mg)., Disp: 24 tablet, Rfl: 2 .  amoxicillin (AMOXIL) 875 MG tablet, Take 875 mg by mouth 2 (two) times daily., Disp: ,  Rfl:  .  Isavuconazonium Sulfate 186 MG CAPS, Take 1 capsule (186 mg total) by mouth as directed. Load with 2 capsules every 8 hours x 6 doses, then 2 capsules daily the following day (Patient not taking: Reported on 07/24/2019), Disp: 36 capsule, Rfl: 0 No current facility-administered medications for this visit.  Facility-Administered Medications Ordered in Other Visits:  .  amphotericin B liposome (AMBISOME) 350 mg in dextrose 5 % 500 mL IVPB, 350 mg, Intravenous, Q24H, Thayer Headings, MD, Last Rate: 999 mL/hr at 07/24/19 1451, Rate Verify at 07/24/19 1451 .  dextrose 5 % 10 mL, 10 mL, Intravenous, Q24H, Comer, Okey Regal, MD, 10 mL at 07/24/19 1421 .  dextrose 5 % 10 mL, 10 mL, Intravenous, Q24H, Comer, Okey Regal, MD, 10 mL at 07/24/19 1213 .  sodium chloride 0.9 % bolus 500 mL, 500 mL, Intravenous, Q24H, Comer, Okey Regal, MD, 500 mL at 07/24/19 1110 .  sodium chloride 0.9 % bolus 500 mL, 500 mL, Intravenous, Q24H, Comer, Okey Regal, MD, 500 mL at 07/24/19 1424 Past Medical History:  Diagnosis Date  . 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 Benign essential HTN 01/04/2012  . 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  Chronic anticoagulation 01/02/2013  . Dermatofibroma of forearm 01/02/2013   Left side  . Hyperlipidemia, mixed 01/04/2012  . 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 Primary hypercoagulable state ([place]) 10/01/2014  . Sinus bradycardia, chronic 01/04/2012  . Superficial thrombosis of lower extremity 05/02/2012   Social History   Socioeconomic History  . Marital status: Divorced    Spouse name: Not on file  . Number of children: 2  . Years of education: Not on file  . Highest education level: Not on file  Occupational History  . Not on file  Tobacco Use  . Smoking status: Former Smoker    Packs/day: 0.50    Years: 30.00      Pack years: 15.00    Types: Cigarettes    Quit date: 10/19/2018    Years since quitting: 0.7  . Smokeless tobacco: Never Used  Substance and Sexual Activity  . Alcohol use: Yes    Alcohol/week: 13.0 - 14.0 standard drinks    Types: 12 Cans of beer, 1 - 2 Shots of liquor per week    Comment: 1-2 times per week.  . Drug use: No  . Sexual activity: Not on file  Other Topics Concern  . Not on file  Social History Narrative  . Not on file   Social Determinants of Health   Financial Resource Strain:   . Difficulty of Paying Living Expenses: Not on file  Food Insecurity:   . Worried About Charity fundraiser in the Last Year: Not on file  . Ran Out of Food in the Last Year: Not on file  Transportation Needs: No Transportation Needs  . Lack of Transportation (Medical): No  . Lack of Transportation (Non-Medical): No  Physical Activity:   . Days of Exercise per Week: Not on file  . Minutes of Exercise per Session: Not on file  Stress:   . Feeling of Stress : Not on file  Social Connections:   . Frequency of Communication with Friends and Family: Not on file  . Frequency of Social Gatherings with Friends and Family: Not on file  . Attends Religious Services: Not on file  . Active Member of Clubs or Organizations: Not on file  . Attends Archivist Meetings: Not on file  . Marital Status: Not on file   Family History  Problem Relation Age of Onset  . Stroke Father     ASSESSMENT Recent Results: The most recent result is correlated with 50 mg per week: Lab Results  Component Value Date   INR 1.9 (A) 07/24/2019   INR 3.0 06/26/2019   INR 2.1 06/19/2019   PROTIME 28.8 (H) 02/04/2015    Anticoagulation Dosing: Description   Take one (1) tablet of your 10mg -white warfarin tablets on Mondays, Tuesdays, Wednesdays and Fridays. All OTHER days--take only one-half (1/2) of your 10mg -white warfarin tablets.      INR today: Subtherapeutic  PLAN Weekly dose was  increased by 10% to 55 mg per week  Patient Instructions  Patient instructed to take medications as defined in the Anti-coagulation Track section of this encounter.  Patient instructed to take today's dose.  Patient instructed to take one (1) tablet of your 10mg -white warfarin tablets on Mondays, Tuesdays, Wednesdays and Fridays. All OTHER days--take only one-half (1/2) of your 10mg -white warfarin tablets.  Patient verbalized understanding of these instructions.    Patient advised to contact clinic or seek medical attention if signs/symptoms of bleeding or thromboembolism occur.  Patient verbalized understanding by repeating  back information and was advised to contact me if further medication-related questions arise. Patient was also provided an information handout.  Follow-up Return in 3 weeks (on 08/14/2019) for Follow up INR.  Pennie Banter, PharmD, CPP  15 minutes spent face-to-face with the patient during the encounter. 50% of time spent on education, including signs/sx bleeding and clotting, as well as food and drug interactions with warfarin. 50% of time was spent on fingerprick POC INR sample collection,processing, results determination, and documentation in http://[email or web address].net/.

## 2019-07-24 NOTE — Progress Notes (Signed)
PATIENT CARE CENTER NOTE  Diagnosis:Thrush; Fungal Infection (B37.0)   Provider:Comer, Herbie [place], MD   Procedure:Ambisome infusion and PICC dressing change   Note:Patient received fourth dose of Ambisome via right arm PICC. 500 cc bolus given pre and post-infusion per order. Patient tolerated infusion well. PICC dressing changed using sterile technique. Vital signs stable. Discharge instructions given. Patient is to receive a total of 14 doses.  Alert, oriented and ambulatory at discharge.

## 2019-07-24 NOTE — Discharge Instructions (Signed)
Amphotericin B Liposomal (LAmB) injection What is this medicine? AMPHOTERICIN (am foe TER i sin) B LIPOSOMAL INJECTION is an antifungal medicine. It is used to treat certain kinds of fungal or yeast infections. This medicine may be used for other purposes; ask your health care provider or pharmacist if you have questions. COMMON BRAND NAME(S): AmBisome What should I tell my health care provider before I take this medicine? They need to know if you have any of these conditions:  immune system problems  other chronic illness  an unusual or allergic reaction to amphotericin B, other medicines, foods, dyes or preservatives  pregnant or trying to get pregnant  breast-feeding How should I use this medicine? This medicine is for infusion into a vein. It is usually given by a health care professional in a hospital or clinic setting. If you get this medicine at home, you will be taught how to prepare and give this medicine. Use exactly as directed. Take your medicine at regular intervals. Do not take your medicine more often than directed. It is important that you put your used needles and syringes in a special sharps container. Do not put them in a trash can. If you do not have a sharps container, call your pharmacist or healthcare provider to get one. Talk to your pediatrician regarding the use of this medicine in children. Special care may be needed. Overdosage: If you think you have taken too much of this medicine contact a poison control center or emergency room at once. NOTE: This medicine is only for you. Do not share this medicine with others. What if I miss a dose? If you miss a dose, use it as soon as you can. If it is almost time for your next dose, use only that dose. Do not use double or extra doses. What may interact with this medicine? Do not take this medicine with any of the following medications:  cidofovir This medicine may also interact with the following  medications:  certain antibiotics given by injection  corticotropin  cyclosporine  digoxin  entecavir  flucytosine  medicines for fungal infections  medicines to treat cancer  muscle relaxers  pentamidine  steroid medicines like prednisone or cortisone This list may not describe all possible interactions. Give your health care provider a list of all the medicines, herbs, non-prescription drugs, or dietary supplements you use. Also tell them if you smoke, drink alcohol, or use illegal drugs. Some items may interact with your medicine. What should I watch for while using this medicine? Your condition will be monitored carefully while you are receiving this medicine. Tell your doctor or health care professional if your symptoms do not improve or if you get new symptoms. What side effects may I notice from receiving this medicine? Side effects that you should report to your doctor or health care professional as soon as possible:  allergic reactions like skin rash or itching, hives, swelling of the lips, mouth, tongue, or throat  anxiety, confusion  breathing trouble  burning, numbness, tingling  change in amount or color of urine  change in hearing, vision  chest tightness  fever, chills, infection  irregular heart beat, blood pressure  seizure  unusual bleeding, bruising  unusually weak or tired Side effects that usually do not require medical attention (report to your doctor or health care professional if they continue or are bothersome):  body aches, pain  cough  diarrhea  headache  flushing, redness of skin  nausea, vomiting  pain where injected  stomach pain  trouble sleeping This list may not describe all possible side effects. Call your doctor for medical advice about side effects. You may report side effects to FDA at 1-800-FDA-1088. Where should I keep my medicine? Keep out of the reach of children. If you are using this medicine at home,  you will be instructed on how to store this medicine. Throw away any unused medicine after the expiration date on the label. NOTE: This sheet is a summary. It may not cover all possible information. If you have questions about this medicine, talk to your doctor, pharmacist, or health care provider.  2020 Elsevier/Gold Standard (2007-12-20 10:47:54)

## 2019-07-24 NOTE — Telephone Encounter (Signed)
Left message with [name] asking her to call Sharyn Lull at [facility] to follow up on their questions. Landis Gandy, RN

## 2019-07-24 NOTE — Telephone Encounter (Signed)
Called patient at number you have provided. No response. Left VM. Patient is scheduled to see me at 1630h today.

## 2019-07-25 ENCOUNTER — Ambulatory Visit (HOSPITAL_COMMUNITY)
Admission: RE | Admit: 2019-07-25 | Discharge: 2019-07-25 | Disposition: A | Discharge status: Home / Self Care | Payer: Medicaid Other | Source: Ambulatory Visit | Attending: Internal Medicine | Admitting: Internal Medicine

## 2019-07-25 ENCOUNTER — Telehealth: Payer: Self-pay | Admitting: *Deleted

## 2019-07-25 DIAGNOSIS — B370 Candidal stomatitis: Secondary | ICD-10-CM | POA: Diagnosis not present

## 2019-07-25 MED ORDER — SODIUM CHLORIDE 0.9 % IV BOLUS FOR AMBISOME
500.0000 mL | INTRAVENOUS | Status: DC
  Administered 2019-07-25: 500 mL via INTRAVENOUS

## 2019-07-25 MED ORDER — HEPARIN SOD (PORK) LOCK FLUSH 100 UNIT/ML IV SOLN
250.0000 [IU] | INTRAVENOUS | Status: AC | PRN
  Administered 2019-07-25: 250 [IU]

## 2019-07-25 MED ORDER — DEXTROSE 5 % IV SOLN
350.0000 mg | INTRAVENOUS | Status: DC
  Administered 2019-07-25: 350 mg via INTRAVENOUS
  Filled 2019-07-25: qty 87.5

## 2019-07-25 MED ORDER — DEXTROSE 5% FOR FLUSHING BEFORE AND AFTER AMBISOME
10.0000 mL | INTRAVENOUS | Status: DC
  Administered 2019-07-25: 14:00:00 10 mL via INTRAVENOUS
  Filled 2019-07-25: qty 10

## 2019-07-25 MED ORDER — SODIUM CHLORIDE 0.9% FLUSH
10.0000 mL | INTRAVENOUS | Status: AC | PRN
  Administered 2019-07-25: 10 mL

## 2019-07-25 MED ORDER — DEXTROSE 5% FOR FLUSHING BEFORE AND AFTER AMBISOME
10.0000 mL | INTRAVENOUS | Status: DC
  Administered 2019-07-25: 10 mL via INTRAVENOUS
  Filled 2019-07-25: qty 10

## 2019-07-25 NOTE — Discharge Instructions (Signed)
Amphotericin B Liposomal (LAmB) injection What is this medicine? AMPHOTERICIN (am foe TER i sin) B LIPOSOMAL INJECTION is an antifungal medicine. It is used to treat certain kinds of fungal or yeast infections. This medicine may be used for other purposes; ask your health care provider or pharmacist if you have questions. COMMON BRAND NAME(S): AmBisome What should I tell my health care provider before I take this medicine? They need to know if you have any of these conditions:  immune system problems  other chronic illness  an unusual or allergic reaction to amphotericin B, other medicines, foods, dyes or preservatives  pregnant or trying to get pregnant  breast-feeding How should I use this medicine? This medicine is for infusion into a vein. It is usually given by a health care professional in a hospital or clinic setting. If you get this medicine at home, you will be taught how to prepare and give this medicine. Use exactly as directed. Take your medicine at regular intervals. Do not take your medicine more often than directed. It is important that you put your used needles and syringes in a special sharps container. Do not put them in a trash can. If you do not have a sharps container, call your pharmacist or healthcare provider to get one. Talk to your pediatrician regarding the use of this medicine in children. Special care may be needed. Overdosage: If you think you have taken too much of this medicine contact a poison control center or emergency room at once. NOTE: This medicine is only for you. Do not share this medicine with others. What if I miss a dose? If you miss a dose, use it as soon as you can. If it is almost time for your next dose, use only that dose. Do not use double or extra doses. What may interact with this medicine? Do not take this medicine with any of the following medications:  cidofovir This medicine may also interact with the following  medications:  certain antibiotics given by injection  corticotropin  cyclosporine  digoxin  entecavir  flucytosine  medicines for fungal infections  medicines to treat cancer  muscle relaxers  pentamidine  steroid medicines like prednisone or cortisone This list may not describe all possible interactions. Give your health care provider a list of all the medicines, herbs, non-prescription drugs, or dietary supplements you use. Also tell them if you smoke, drink alcohol, or use illegal drugs. Some items may interact with your medicine. What should I watch for while using this medicine? Your condition will be monitored carefully while you are receiving this medicine. Tell your doctor or health care professional if your symptoms do not improve or if you get new symptoms. What side effects may I notice from receiving this medicine? Side effects that you should report to your doctor or health care professional as soon as possible:  allergic reactions like skin rash or itching, hives, swelling of the lips, mouth, tongue, or throat  anxiety, confusion  breathing trouble  burning, numbness, tingling  change in amount or color of urine  change in hearing, vision  chest tightness  fever, chills, infection  irregular heart beat, blood pressure  seizure  unusual bleeding, bruising  unusually weak or tired Side effects that usually do not require medical attention (report to your doctor or health care professional if they continue or are bothersome):  body aches, pain  cough  diarrhea  headache  flushing, redness of skin  nausea, vomiting  pain where injected  stomach pain  trouble sleeping This list may not describe all possible side effects. Call your doctor for medical advice about side effects. You may report side effects to FDA at 1-800-FDA-1088. Where should I keep my medicine? Keep out of the reach of children. If you are using this medicine at home,  you will be instructed on how to store this medicine. Throw away any unused medicine after the expiration date on the label. NOTE: This sheet is a summary. It may not cover all possible information. If you have questions about this medicine, talk to your doctor, pharmacist, or health care provider.  2020 Elsevier/Gold Standard (2007-12-20 10:47:54)

## 2019-07-25 NOTE — Telephone Encounter (Signed)
Per Dr Linus Salmons, relayed advice to follow up with ENT regarding lock jaw and mucous-filled cyst found on CT. They will reach out to their cancer center nurse to help coordinate this. Patient is wondering when he will start to feel better.  He still has the same symptoms and level of pain as before. He will follow up with his oncologist regarding pain medication.  Today was his 5th dose (12/29 - 12/31, 1/4 & 1/5).

## 2019-07-25 NOTE — Telephone Encounter (Signed)
Relayed instructions to Gwenyth Bouillon. He has had difficulty swallowing his potassium 20 meq. [name] will crush these for him.  They will take 40 meq once daily for 3 days.   Comer, Okey Regal, MD  P [facility] Triage Nurse Pool  Could you have the patient take 40 mEq of KCL once daily for 3 days. I think he has the pills already. thanks

## 2019-07-25 NOTE — Progress Notes (Signed)
PATIENT CARE CENTER NOTE  Diagnosis: Thrush, fungal infection   Provider: Scharlene Gloss   Procedure: Ambisome infusion   Note: patient received Ambisome via right arm PICC. 500 cc normal saline bolus given pre and post infusions. Tolerated well, vitals stable, discharge instructions given, verbalized understanding. Patient alert, oriented and ambulatory at the time of discharge.

## 2019-07-25 NOTE — Progress Notes (Signed)
INTERNAL MEDICINE TEACHING ATTENDING ADDENDUM   I agree with pharmacy recommendations as outlined in their note.   Lux Skilton, MD  

## 2019-07-26 ENCOUNTER — Other Ambulatory Visit: Payer: Self-pay

## 2019-07-26 ENCOUNTER — Ambulatory Visit (HOSPITAL_COMMUNITY)
Admission: RE | Admit: 2019-07-26 | Discharge: 2019-07-26 | Disposition: A | Discharge status: Home / Self Care | Payer: Medicaid Other | Source: Ambulatory Visit | Attending: Internal Medicine | Admitting: Internal Medicine

## 2019-07-26 ENCOUNTER — Encounter: Payer: Medicaid Other | Admitting: Nutrition

## 2019-07-26 ENCOUNTER — Other Ambulatory Visit: Payer: Self-pay | Admitting: Hematology

## 2019-07-26 ENCOUNTER — Telehealth: Payer: Self-pay | Admitting: Nutrition

## 2019-07-26 DIAGNOSIS — B370 Candidal stomatitis: Secondary | ICD-10-CM

## 2019-07-26 MED ORDER — DEXTROSE 5 % IV SOLN
350.0000 mg | INTRAVENOUS | Status: DC
  Administered 2019-07-26: 350 mg via INTRAVENOUS
  Filled 2019-07-26: qty 87.5

## 2019-07-26 MED ORDER — HYDROCODONE-ACETAMINOPHEN 10-325 MG PO TABS: 1.0000 | ORAL_TABLET | Freq: Three times a day (TID) | ORAL | 0 refills | Status: DC | PRN

## 2019-07-26 MED ORDER — SODIUM CHLORIDE 0.9 % IV BOLUS FOR AMBISOME
500.0000 mL | INTRAVENOUS | Status: DC
  Administered 2019-07-26: 16:00:00 500 mL via INTRAVENOUS

## 2019-07-26 MED ORDER — HEPARIN SOD (PORK) LOCK FLUSH 100 UNIT/ML IV SOLN
250.0000 [IU] | INTRAVENOUS | Status: AC | PRN
  Administered 2019-07-26: 250 [IU]
  Filled 2019-07-26: qty 5

## 2019-07-26 MED ORDER — DEXTROSE 5% FOR FLUSHING BEFORE AND AFTER AMBISOME
10.0000 mL | INTRAVENOUS | Status: DC
  Administered 2019-07-26: 10 mL via INTRAVENOUS
  Filled 2019-07-26: qty 50

## 2019-07-26 MED ORDER — CYCLOBENZAPRINE HCL 10 MG PO TABS: 10.0000 mg | ORAL_TABLET | Freq: Three times a day (TID) | ORAL | 0 refills | Status: DC | PRN

## 2019-07-26 MED ORDER — SODIUM CHLORIDE 0.9 % IV BOLUS FOR AMBISOME
500.0000 mL | INTRAVENOUS | Status: DC
  Administered 2019-07-26: 12:00:00 500 mL via INTRAVENOUS

## 2019-07-26 MED ORDER — SODIUM CHLORIDE 0.9% FLUSH
10.0000 mL | INTRAVENOUS | Status: AC | PRN
  Administered 2019-07-26: 16:00:00 10 mL

## 2019-07-26 NOTE — Progress Notes (Signed)
PATIENT CARE CENTER NOTE  Diagnosis: Thrush, fungal infection   Provider: Scharlene Gloss MD   Procedure: Ambisome infusion   Note: Patient received Ambisome via right arm PICC. 500 cc of normal saline bolus given pre and post infusions.Tolerated well, vitals stable, discharge instructions given, verbalized understanding. Patient alert, oriented and ambulatory at the time of discharge.

## 2019-07-26 NOTE — Discharge Instructions (Signed)
Amphotericin B Liposomal (LAmB) injection What is this medicine? AMPHOTERICIN (am foe TER i sin) B LIPOSOMAL INJECTION is an antifungal medicine. It is used to treat certain kinds of fungal or yeast infections. This medicine may be used for other purposes; ask your health care provider or pharmacist if you have questions. COMMON BRAND NAME(S): AmBisome What should I tell my health care provider before I take this medicine? They need to know if you have any of these conditions:  immune system problems  other chronic illness  an unusual or allergic reaction to amphotericin B, other medicines, foods, dyes or preservatives  pregnant or trying to get pregnant  breast-feeding How should I use this medicine? This medicine is for infusion into a vein. It is usually given by a health care professional in a hospital or clinic setting. If you get this medicine at home, you will be taught how to prepare and give this medicine. Use exactly as directed. Take your medicine at regular intervals. Do not take your medicine more often than directed. It is important that you put your used needles and syringes in a special sharps container. Do not put them in a trash can. If you do not have a sharps container, call your pharmacist or healthcare provider to get one. Talk to your pediatrician regarding the use of this medicine in children. Special care may be needed. Overdosage: If you think you have taken too much of this medicine contact a poison control center or emergency room at once. NOTE: This medicine is only for you. Do not share this medicine with others. What if I miss a dose? If you miss a dose, use it as soon as you can. If it is almost time for your next dose, use only that dose. Do not use double or extra doses. What may interact with this medicine? Do not take this medicine with any of the following medications:  cidofovir This medicine may also interact with the following  medications:  certain antibiotics given by injection  corticotropin  cyclosporine  digoxin  entecavir  flucytosine  medicines for fungal infections  medicines to treat cancer  muscle relaxers  pentamidine  steroid medicines like prednisone or cortisone This list may not describe all possible interactions. Give your health care provider a list of all the medicines, herbs, non-prescription drugs, or dietary supplements you use. Also tell them if you smoke, drink alcohol, or use illegal drugs. Some items may interact with your medicine. What should I watch for while using this medicine? Your condition will be monitored carefully while you are receiving this medicine. Tell your doctor or health care professional if your symptoms do not improve or if you get new symptoms. What side effects may I notice from receiving this medicine? Side effects that you should report to your doctor or health care professional as soon as possible:  allergic reactions like skin rash or itching, hives, swelling of the lips, mouth, tongue, or throat  anxiety, confusion  breathing trouble  burning, numbness, tingling  change in amount or color of urine  change in hearing, vision  chest tightness  fever, chills, infection  irregular heart beat, blood pressure  seizure  unusual bleeding, bruising  unusually weak or tired Side effects that usually do not require medical attention (report to your doctor or health care professional if they continue or are bothersome):  body aches, pain  cough  diarrhea  headache  flushing, redness of skin  nausea, vomiting  pain where injected  stomach pain  trouble sleeping This list may not describe all possible side effects. Call your doctor for medical advice about side effects. You may report side effects to FDA at 1-800-FDA-1088. Where should I keep my medicine? Keep out of the reach of children. If you are using this medicine at home,  you will be instructed on how to store this medicine. Throw away any unused medicine after the expiration date on the label. NOTE: This sheet is a summary. It may not cover all possible information. If you have questions about this medicine, talk to your doctor, pharmacist, or health care provider.  2020 Elsevier/Gold Standard (2007-12-20 10:47:54)

## 2019-07-26 NOTE — Telephone Encounter (Signed)
Patient's girlfriend Manual Meier contacted me by telephone.  She reports patient is having difficulty consuming soft foods.  She reports his jaw is not opening very wide and he is unable to chew anything.  She describes it somewhat like " lockjaw".  Reports she has a call into patient's MD.  She would like to know what kinds of food patient could consume. Provided education on blenderized diet and strategies for pureing different foods to different textures and consistencies. Encourage Carnation instant breakfast be prepared with fortified milk to provide 350 cal and 20 g of protein per 8 ounces.  Also recommended Ensure Enlive if patient prefers.  I will email education fact sheets and recipes to Lisa's email and her request.  They have my contact information for further questions.

## 2019-07-27 ENCOUNTER — Ambulatory Visit (HOSPITAL_COMMUNITY)
Admission: RE | Admit: 2019-07-27 | Discharge: 2019-07-27 | Disposition: A | Discharge status: Home / Self Care | Payer: Medicaid Other | Source: Ambulatory Visit | Attending: Internal Medicine | Admitting: Internal Medicine

## 2019-07-27 DIAGNOSIS — B370 Candidal stomatitis: Secondary | ICD-10-CM | POA: Diagnosis present

## 2019-07-27 MED ORDER — SODIUM CHLORIDE 0.9 % IV BOLUS FOR AMBISOME
500.0000 mL | INTRAVENOUS | Status: DC
  Administered 2019-07-27: 11:00:00 500 mL via INTRAVENOUS

## 2019-07-27 MED ORDER — SODIUM CHLORIDE 0.9% FLUSH
10.0000 mL | INTRAVENOUS | Status: AC | PRN
  Administered 2019-07-27: 10 mL

## 2019-07-27 MED ORDER — DEXTROSE 5 % IV SOLN
350.0000 mg | INTRAVENOUS | Status: DC
  Administered 2019-07-27: 13:00:00 350 mg via INTRAVENOUS
  Filled 2019-07-27: qty 87.5

## 2019-07-27 MED ORDER — DEXTROSE 5% FOR FLUSHING BEFORE AND AFTER AMBISOME
10.0000 mL | INTRAVENOUS | Status: DC
  Administered 2019-07-27: 15:00:00 10 mL via INTRAVENOUS
  Filled 2019-07-27: qty 10

## 2019-07-27 MED ORDER — HEPARIN SOD (PORK) LOCK FLUSH 100 UNIT/ML IV SOLN
250.0000 [IU] | INTRAVENOUS | Status: AC | PRN
  Administered 2019-07-27: 16:00:00 250 [IU]
  Filled 2019-07-27: qty 5

## 2019-07-27 MED ORDER — DEXTROSE 5% FOR FLUSHING BEFORE AND AFTER AMBISOME
10.0000 mL | INTRAVENOUS | Status: DC
  Administered 2019-07-27: 13:00:00 10 mL via INTRAVENOUS
  Filled 2019-07-27: qty 10

## 2019-07-27 MED ORDER — SODIUM CHLORIDE 0.9 % IV BOLUS FOR AMBISOME
500.0000 mL | INTRAVENOUS | Status: DC
  Administered 2019-07-27: 500 mL via INTRAVENOUS

## 2019-07-27 NOTE — Progress Notes (Signed)
PATIENT CARE CENTER NOTE  Diagnosis:Thrush; Fungal Infection (B37.0)   Provider:Comer, Herbie [place], MD   Procedure:Ambisome infusion   Note:Patient receivedseventh dose ofAmbisome via right arm PICC. 500 cc bolus given pre and post-infusion per order. Patient tolerated infusion well. Vital signs stable. Discharge instructions given. Patient is to receive a total of 14 doses.Alert, oriented and ambulatory at discharge.

## 2019-07-27 NOTE — Discharge Instructions (Signed)
Amphotericin B Liposomal (LAmB) injection What is this medicine? AMPHOTERICIN (am foe TER i sin) B LIPOSOMAL INJECTION is an antifungal medicine. It is used to treat certain kinds of fungal or yeast infections. This medicine may be used for other purposes; ask your health care provider or pharmacist if you have questions. COMMON BRAND NAME(S): AmBisome What should I tell my health care provider before I take this medicine? They need to know if you have any of these conditions:  immune system problems  other chronic illness  an unusual or allergic reaction to amphotericin B, other medicines, foods, dyes or preservatives  pregnant or trying to get pregnant  breast-feeding How should I use this medicine? This medicine is for infusion into a vein. It is usually given by a health care professional in a hospital or clinic setting. If you get this medicine at home, you will be taught how to prepare and give this medicine. Use exactly as directed. Take your medicine at regular intervals. Do not take your medicine more often than directed. It is important that you put your used needles and syringes in a special sharps container. Do not put them in a trash can. If you do not have a sharps container, call your pharmacist or healthcare provider to get one. Talk to your pediatrician regarding the use of this medicine in children. Special care may be needed. Overdosage: If you think you have taken too much of this medicine contact a poison control center or emergency room at once. NOTE: This medicine is only for you. Do not share this medicine with others. What if I miss a dose? If you miss a dose, use it as soon as you can. If it is almost time for your next dose, use only that dose. Do not use double or extra doses. What may interact with this medicine? Do not take this medicine with any of the following medications:  cidofovir This medicine may also interact with the following  medications:  certain antibiotics given by injection  corticotropin  cyclosporine  digoxin  entecavir  flucytosine  medicines for fungal infections  medicines to treat cancer  muscle relaxers  pentamidine  steroid medicines like prednisone or cortisone This list may not describe all possible interactions. Give your health care provider a list of all the medicines, herbs, non-prescription drugs, or dietary supplements you use. Also tell them if you smoke, drink alcohol, or use illegal drugs. Some items may interact with your medicine. What should I watch for while using this medicine? Your condition will be monitored carefully while you are receiving this medicine. Tell your doctor or health care professional if your symptoms do not improve or if you get new symptoms. What side effects may I notice from receiving this medicine? Side effects that you should report to your doctor or health care professional as soon as possible:  allergic reactions like skin rash or itching, hives, swelling of the lips, mouth, tongue, or throat  anxiety, confusion  breathing trouble  burning, numbness, tingling  change in amount or color of urine  change in hearing, vision  chest tightness  fever, chills, infection  irregular heart beat, blood pressure  seizure  unusual bleeding, bruising  unusually weak or tired Side effects that usually do not require medical attention (report to your doctor or health care professional if they continue or are bothersome):  body aches, pain  cough  diarrhea  headache  flushing, redness of skin  nausea, vomiting  pain where injected  stomach pain  trouble sleeping This list may not describe all possible side effects. Call your doctor for medical advice about side effects. You may report side effects to FDA at 1-800-FDA-1088. Where should I keep my medicine? Keep out of the reach of children. If you are using this medicine at home,  you will be instructed on how to store this medicine. Throw away any unused medicine after the expiration date on the label. NOTE: This sheet is a summary. It may not cover all possible information. If you have questions about this medicine, talk to your doctor, pharmacist, or health care provider.  2020 Elsevier/Gold Standard (2007-12-20 10:47:54)

## 2019-07-28 ENCOUNTER — Other Ambulatory Visit: Payer: Self-pay

## 2019-07-28 ENCOUNTER — Ambulatory Visit
Admission: RE | Admit: 2019-07-28 | Discharge: 2019-07-28 | Disposition: A | Discharge status: Home / Self Care | Payer: Medicaid Other | Source: Ambulatory Visit | Attending: Radiation Oncology | Admitting: Radiation Oncology

## 2019-07-28 ENCOUNTER — Ambulatory Visit (HOSPITAL_COMMUNITY)
Admission: RE | Admit: 2019-07-28 | Discharge: 2019-07-28 | Disposition: A | Discharge status: Home / Self Care | Payer: Medicaid Other | Source: Ambulatory Visit | Attending: Internal Medicine | Admitting: Internal Medicine

## 2019-07-28 DIAGNOSIS — C090 Malignant neoplasm of tonsillar fossa: Secondary | ICD-10-CM

## 2019-07-28 DIAGNOSIS — B370 Candidal stomatitis: Secondary | ICD-10-CM | POA: Diagnosis not present

## 2019-07-28 LAB — CBC
HCT: 40 % (ref 39.0–52.0)
Hemoglobin: 13.5 g/dL (ref 13.0–17.0)
MCH: 32 pg (ref 26.0–34.0)
MCHC: 33.8 g/dL (ref 30.0–36.0)
MCV: 94.8 fL (ref 80.0–100.0)
Platelets: 249 10*3/uL (ref 150–400)
RBC: 4.22 MIL/uL (ref 4.22–5.81)
RDW: 13.1 % (ref 11.5–15.5)
WBC: 8.1 10*3/uL (ref 4.0–10.5)
nRBC: 0 % (ref 0.0–0.2)

## 2019-07-28 LAB — BASIC METABOLIC PANEL
Anion gap: 13 (ref 5–15)
BUN: 13 mg/dL (ref 6–20)
CO2: 25 mmol/L (ref 22–32)
Calcium: 9.1 mg/dL (ref 8.9–10.3)
Chloride: 100 mmol/L (ref 98–111)
Creatinine, Ser: 0.84 mg/dL (ref 0.61–1.24)
GFR calc Af Amer: >60 mL/min (ref >60)
GFR calc non Af Amer: >60 mL/min (ref >60)
Glucose, Bld: 115 mg/dL — ABNORMAL HIGH (ref 70–99)
Potassium: 2.6 mmol/L — CL (ref 3.5–5.1)
Sodium: 138 mmol/L (ref 135–145)

## 2019-07-28 LAB — MAGNESIUM: Magnesium: 1.6 mg/dL — ABNORMAL LOW (ref 1.7–2.4)

## 2019-07-28 MED ORDER — DEXTROSE 5% FOR FLUSHING BEFORE AND AFTER AMBISOME
10.0000 mL | INTRAVENOUS | Status: DC
  Administered 2019-07-28: 10 mL via INTRAVENOUS
  Filled 2019-07-28 (×2): qty 50

## 2019-07-28 MED ORDER — HEPARIN SOD (PORK) LOCK FLUSH 100 UNIT/ML IV SOLN
250.0000 [IU] | INTRAVENOUS | Status: AC | PRN
  Administered 2019-07-28: 250 [IU]
  Filled 2019-07-28: qty 5

## 2019-07-28 MED ORDER — SODIUM CHLORIDE 0.9 % IV BOLUS FOR AMBISOME
500.0000 mL | INTRAVENOUS | Status: DC
  Administered 2019-07-28: 500 mL via INTRAVENOUS

## 2019-07-28 MED ORDER — DEXTROSE 5% FOR FLUSHING BEFORE AND AFTER AMBISOME
10.0000 mL | INTRAVENOUS | Status: DC
  Administered 2019-07-28: 10 mL via INTRAVENOUS
  Filled 2019-07-28: qty 10

## 2019-07-28 MED ORDER — DEXTROSE 5 % IV SOLN
350.0000 mg | INTRAVENOUS | Status: DC
  Administered 2019-07-28: 350 mg via INTRAVENOUS
  Filled 2019-07-28: qty 87.5

## 2019-07-28 MED ORDER — SODIUM CHLORIDE 0.9% FLUSH
10.0000 mL | INTRAVENOUS | Status: AC | PRN
  Administered 2019-07-28: 10 mL

## 2019-07-28 NOTE — Progress Notes (Signed)
CRITICAL VALUE ALERT  Critical Value:  Potassium 2.6  Date & Time Notied:  07/28/19 at 11:42  Provider Notified: Scharlene Gloss, MD  Orders Received/Actions taken: Per Comer, MD patient advised to increase Potassium pills to twice daily.

## 2019-07-28 NOTE — Discharge Instructions (Signed)
Amphotericin B Liposomal (LAmB) injection What is this medicine? AMPHOTERICIN (am foe TER i sin) B LIPOSOMAL INJECTION is an antifungal medicine. It is used to treat certain kinds of fungal or yeast infections. This medicine may be used for other purposes; ask your health care provider or pharmacist if you have questions. COMMON BRAND NAME(S): AmBisome What should I tell my health care provider before I take this medicine? They need to know if you have any of these conditions:  immune system problems  other chronic illness  an unusual or allergic reaction to amphotericin B, other medicines, foods, dyes or preservatives  pregnant or trying to get pregnant  breast-feeding How should I use this medicine? This medicine is for infusion into a vein. It is usually given by a health care professional in a hospital or clinic setting. If you get this medicine at home, you will be taught how to prepare and give this medicine. Use exactly as directed. Take your medicine at regular intervals. Do not take your medicine more often than directed. It is important that you put your used needles and syringes in a special sharps container. Do not put them in a trash can. If you do not have a sharps container, call your pharmacist or healthcare provider to get one. Talk to your pediatrician regarding the use of this medicine in children. Special care may be needed. Overdosage: If you think you have taken too much of this medicine contact a poison control center or emergency room at once. NOTE: This medicine is only for you. Do not share this medicine with others. What if I miss a dose? If you miss a dose, use it as soon as you can. If it is almost time for your next dose, use only that dose. Do not use double or extra doses. What may interact with this medicine? Do not take this medicine with any of the following medications:  cidofovir This medicine may also interact with the following  medications:  certain antibiotics given by injection  corticotropin  cyclosporine  digoxin  entecavir  flucytosine  medicines for fungal infections  medicines to treat cancer  muscle relaxers  pentamidine  steroid medicines like prednisone or cortisone This list may not describe all possible interactions. Give your health care provider a list of all the medicines, herbs, non-prescription drugs, or dietary supplements you use. Also tell them if you smoke, drink alcohol, or use illegal drugs. Some items may interact with your medicine. What should I watch for while using this medicine? Your condition will be monitored carefully while you are receiving this medicine. Tell your doctor or health care professional if your symptoms do not improve or if you get new symptoms. What side effects may I notice from receiving this medicine? Side effects that you should report to your doctor or health care professional as soon as possible:  allergic reactions like skin rash or itching, hives, swelling of the lips, mouth, tongue, or throat  anxiety, confusion  breathing trouble  burning, numbness, tingling  change in amount or color of urine  change in hearing, vision  chest tightness  fever, chills, infection  irregular heart beat, blood pressure  seizure  unusual bleeding, bruising  unusually weak or tired Side effects that usually do not require medical attention (report to your doctor or health care professional if they continue or are bothersome):  body aches, pain  cough  diarrhea  headache  flushing, redness of skin  nausea, vomiting  pain where injected  stomach pain  trouble sleeping This list may not describe all possible side effects. Call your doctor for medical advice about side effects. You may report side effects to FDA at 1-800-FDA-1088. Where should I keep my medicine? Keep out of the reach of children. If you are using this medicine at home,  you will be instructed on how to store this medicine. Throw away any unused medicine after the expiration date on the label. NOTE: This sheet is a summary. It may not cover all possible information. If you have questions about this medicine, talk to your doctor, pharmacist, or health care provider.  2020 Elsevier/Gold Standard (2007-12-20 10:47:54)

## 2019-07-28 NOTE — Progress Notes (Signed)
PATIENT CARE CENTER NOTE  Diagnosis:Thrush; Fungal Infection (B37.0)   Provider:Comer, Herbie [place], MD   Procedure:Ambisome infusion and lab draw   Note:Patient received eighth dose of Ambisome via right arm PICC. Labs also drawn from PICC per order. 500 cc bolus given pre and post-infusion per order. Patient tolerated infusion well. Vital signs stable. Discharge instructions given. Patient is to receive a total of 14 doses.  Alert, oriented and ambulatory at discharge.

## 2019-07-28 NOTE — Progress Notes (Signed)
Radiation Oncology         [phone] ________________________________  Name: [name] [name] MRN: [id number]  Date: 07/28/2019  DOB: [birth year]  Follow-Up Visit Note by telephone as patient was unable to access MyChart video during pandemic precautions  CC: [name] Hansen, MD  [name] Belt, MD  Diagnosis and Prior Radiotherapy:       ICD-10-CM   1. 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 She reports continued difficulty with long-term radiation side effects. He has a fungal infection which he is receiving therapy for from Infectious Disease. She reports difficulty with "lock jaw", which has gotten worse lately.    I reached patient by phone today - he is smoking occasionally.  He has "thrush" all around his mouth, getting tx'd by infectious disease (Amphotericin B via [name]) but he doesn't think it is helping.  1 mo he saw Dr. Benjamine Mola, appt made for [facility]NT (pending for next week)  He is having issues with being unable to open jaw since December, getting worse. + Throat pain and HAs.  Muscle relaxant might help a little, Rx'd by Dr Maylon Peppers.  ALLERGIES:  has No Known Allergies.  Meds: Current Outpatient Medications  Medication Sig Dispense Refill  . amLODipine (NORVASC) 10 MG tablet Take 1 tablet (10 mg total) by mouth daily. 90 tablet 2  . amoxicillin (AMOXIL) 875 MG tablet Take 875 mg by mouth 2 (two) times daily.    Marland Kitchen atorvastatin (LIPITOR) 20 MG tablet Take 1 tablet (20 mg total) by mouth daily. (Patient taking  differently: Take 20 mg by mouth as needed. Few times a week per pt.) 30 tablet 11  . cyclobenzaprine (FLEXERIL) 10 MG tablet Take 1 tablet (10 mg total) by mouth 3 (three) times daily as needed for muscle spasms. 30 tablet 0  . HYDROcodone-acetaminophen (NORCO) 10-325 MG tablet Take 1 tablet by mouth every 8 (eight) hours as needed. 40 tablet 0  . ibuprofen (ADVIL) 200 MG tablet Take 600 mg by mouth every 6 (six) hours as needed for pain or fever.     . Isavuconazonium Sulfate 186 MG CAPS Take 1 capsule (186 mg total) by mouth as directed. Load with 2 capsules every 8 hours x 6 doses, then 2 capsules daily the following day (Patient not taking: Reported on 07/24/2019) 36 capsule 0  . sodium fluoride (PREVIDENT 5000 PLUS) 1.1 % CREA dental cream Apply to tooth brush. Brush teeth for 2 minutes. Spit out excess. DO NOT rinse afterwards. Repeat nightly. 1 Tube prn  . warfarin (COUMADIN) 10 MG tablet Take 1 tablet of 10 mg warfarin daily except on Mondays, Wednesdays and Fridays, take only 1/2 tablet (5 mg). 24 tablet 2   No current facility-administered medications for this encounter.   Facility-Administered Medications Ordered in Other Encounters  Medication Dose Route Frequency Provider Last Rate Last Admin  . amphotericin B liposome (AMBISOME) 350 mg in dextrose 5 % 500 mL IVPB  350 mg Intravenous Q24H Thayer Headings, MD 293.8 mL/hr at 08/02/19 1230 350 mg at  08/02/19 1230  . dextrose 5 % 10 mL  10 mL Intravenous Q24H Thayer Headings, MD   10 mL at 08/02/19 1224  . dextrose 5 % 10 mL  10 mL Intravenous Q24H Thayer Headings, MD      . sodium chloride 0.9 % bolus 500 mL  500 mL Intravenous Q24H Thayer Headings, MD   500 mL at 08/02/19 1112  . sodium chloride 0.9 % bolus 500 mL  500 mL Intravenous Q24H Thayer Headings, MD        Physical Findings: The patient is in no acute distress. Patient is alert and oriented. Wt Readings from Last 3 Encounters:  07/18/19 225 lb (102.1 kg)  06/19/19 230 lb  (104.3 kg)  06/07/19 240 lb (108.9 kg)    vitals were not taken for this visit. .   General: Alert and oriented, in no acute distress   Lab Findings: Lab Results  Component Value Date   WBC 7.3 07/31/2019   HGB 14.1 07/31/2019   HCT 41.6 07/31/2019   MCV 93.7 07/31/2019   PLT 296 07/31/2019    Lab Results  Component Value Date   TSH 3.216 05/24/2019    Radiographic Findings: IR PICC PLACEMENT RIGHT >5 YRS INC IMG GUIDE  Result Date: 07/17/2019 INDICATION: 59 year old male with a history of IV antibiotics EXAM: PICC LINE PLACEMENT WITH ULTRASOUND AND FLUOROSCOPIC GUIDANCE MEDICATIONS: None ANESTHESIA/SEDATION: None FLUOROSCOPY TIME:  Fluoroscopy Time: 0 minutes 6 seconds (1 mGy). COMPLICATIONS: None PROCEDURE: Informed written consent was obtained from the patient after a thorough discussion of the procedural risks, benefits and alternatives. All questions were addressed. Maximal Sterile Barrier Technique was utilized including caps, mask, sterile gowns, sterile gloves, sterile drape, hand hygiene and skin antiseptic. A timeout was performed prior to the initiation of the procedure. Dr Earleen Newport and Ms Namon Cirri, PA participated with placement of the PICC. Patient was position in the supine position on the fluoroscopy table with the right arm abducted 90 degrees. Ultrasound survey of the upper extremity was performed with images stored and sent to PACs. The right basilic vein was selected for access. Once the patient was prepped and draped in the usual sterile fashion, the skin and subcutaneous tissues were generously infiltrated with 1% lidocaine for local anesthesia. A micropuncture access kit was then used to access the targeted vein. Wire was passed centrally, confirmed to be within the venous system under fluoroscopy. A small stab incision was made with an 11 blade scalpel and the sheath was then placed over the wire. Estimated length of the catheter was then performed with the indwelling  wire. Catheter was amputated at 49 cm length and placed with coaxial wire through the peel-away. Single-lumen, power injectable PICC in the basilic vein. Tip confirmed at the cavoatrial junction, and the catheter is ready for use. Stat lock was placed. Patient tolerated the procedure well and remained hemodynamically stable throughout. No complications were encountered and no significant blood loss was encountered. IMPRESSION: Status post right basilic vein single-lumen power injectable PICC. Catheter ready for use. Signed, Dulcy Fanny. Dellia Nims, RPVI [facility] Radiology Electronically Signed   By: Corrie Mckusick D.O.   On: 07/17/2019 15:11    Impression/Plan: Patient has multiple issues going on.  He may need a biopsy to rule out local progression.  Note emailed to team by me today,  "Hi everyone, I returned a call to Jolene Provost today. DOB September 23, 1960  L tonsil cancer, treated with  Chemo RT and completed 7 month ago.  Last month's surveillance CT questioned concerning fullness in L oropharynx, can't rule out residual tumor.  Patient concerned today about severe worsening trismus, throat pain, "Thrush" not resolving with Amphotericin B, pain in throat, maxillary cyst ("is it causing my HAs and congestion?")  I thought it would be helpful to touch base with all of you.  Su - how did his exam look in Dec?  Did you see the primary site when you scoped him?  Herbie Baltimore - are you convinced he has thrush?    I saw a note in the EMR about possible biopsy but it was hard to discern.  Wondering about that too.  Also, pt said he is referred by Su to Dr Nobie Putnam at [facility].  Not sure what that is for.  Hoping we can share notes/impressions to help this nice gentleman.  Thanks! Pauline Trainer"  I let patient know I would contact him once I hear back from the team.   Encounter, on date of service, took a total of 35  minutes.  _____________________________________   Eppie Gibson, MD  This document serves as a record of services personally performed by Eppie Gibson, MD. It was created on her behalf by Wilburn Mylar, a trained medical scribe. The creation of this record is based on the scribe's personal observations and the provider's statements to them. This document has been checked and approved by the attending provider.

## 2019-07-31 ENCOUNTER — Other Ambulatory Visit: Payer: Self-pay

## 2019-07-31 ENCOUNTER — Encounter: Payer: Self-pay | Admitting: Radiation Oncology

## 2019-07-31 ENCOUNTER — Ambulatory Visit (HOSPITAL_COMMUNITY)
Admission: RE | Admit: 2019-07-31 | Discharge: 2019-07-31 | Disposition: A | Discharge status: Home / Self Care | Payer: Medicaid Other | Source: Ambulatory Visit | Attending: Internal Medicine | Admitting: Internal Medicine

## 2019-07-31 DIAGNOSIS — B370 Candidal stomatitis: Secondary | ICD-10-CM | POA: Diagnosis not present

## 2019-07-31 LAB — CBC
HCT: 41.6 % (ref 39.0–52.0)
Hemoglobin: 14.1 g/dL (ref 13.0–17.0)
MCH: 31.8 pg (ref 26.0–34.0)
MCHC: 33.9 g/dL (ref 30.0–36.0)
MCV: 93.7 fL (ref 80.0–100.0)
Platelets: 296 10*3/uL (ref 150–400)
RBC: 4.44 MIL/uL (ref 4.22–5.81)
RDW: 12.9 % (ref 11.5–15.5)
WBC: 7.3 10*3/uL (ref 4.0–10.5)
nRBC: 0 % (ref 0.0–0.2)

## 2019-07-31 LAB — MAGNESIUM: Magnesium: 1.6 mg/dL — ABNORMAL LOW (ref 1.7–2.4)

## 2019-07-31 LAB — BASIC METABOLIC PANEL
Anion gap: 14 (ref 5–15)
BUN: 19 mg/dL (ref 6–20)
CO2: 26 mmol/L (ref 22–32)
Calcium: 9.2 mg/dL (ref 8.9–10.3)
Chloride: 99 mmol/L (ref 98–111)
Creatinine, Ser: 1.18 mg/dL (ref 0.61–1.24)
GFR calc Af Amer: >60 mL/min (ref >60)
GFR calc non Af Amer: >60 mL/min (ref >60)
Glucose, Bld: 96 mg/dL (ref 70–99)
Potassium: 2.6 mmol/L — CL (ref 3.5–5.1)
Sodium: 139 mmol/L (ref 135–145)

## 2019-07-31 MED ORDER — SODIUM CHLORIDE 0.9 % IV BOLUS FOR AMBISOME
500.0000 mL | INTRAVENOUS | Status: DC
  Administered 2019-07-31: 500 mL via INTRAVENOUS

## 2019-07-31 MED ORDER — DEXTROSE 5% FOR FLUSHING BEFORE AND AFTER AMBISOME
10.0000 mL | INTRAVENOUS | Status: DC
  Administered 2019-07-31: 10 mL via INTRAVENOUS
  Filled 2019-07-31 (×2): qty 50

## 2019-07-31 MED ORDER — DEXTROSE 5 % IV SOLN
350.0000 mg | INTRAVENOUS | Status: DC
  Administered 2019-07-31: 350 mg via INTRAVENOUS
  Filled 2019-07-31: qty 87.5

## 2019-07-31 MED ORDER — SODIUM CHLORIDE 0.9% FLUSH
10.0000 mL | INTRAVENOUS | Status: AC | PRN
  Administered 2019-07-31: 10 mL

## 2019-07-31 MED ORDER — HEPARIN SOD (PORK) LOCK FLUSH 100 UNIT/ML IV SOLN
250.0000 [IU] | INTRAVENOUS | Status: AC | PRN
  Administered 2019-07-31: 250 [IU]
  Filled 2019-07-31: qty 5

## 2019-07-31 NOTE — Progress Notes (Signed)
PATIENT CARE CENTER NOTE  Diagnosis: Thrush, fungal infection   Provider: Scharlene Gloss   Procedure: Ambisome infusion,  lab draw and PICC line dressing change.   Note:   Patient received Ambisome via a right arm PICC line. Tolerated well, vitals stable, patient declined the printed discharge instructions given.  Patient alert, oriented and ambulatory at the time of discharge. Will be back tomorrow for a continuation of the infusion.

## 2019-07-31 NOTE — Discharge Instructions (Signed)
Amphotericin B Liposomal (LAmB) injection What is this medicine? AMPHOTERICIN (am foe TER i sin) B LIPOSOMAL INJECTION is an antifungal medicine. It is used to treat certain kinds of fungal or yeast infections. This medicine may be used for other purposes; ask your health care provider or pharmacist if you have questions. COMMON BRAND NAME(S): AmBisome What should I tell my health care provider before I take this medicine? They need to know if you have any of these conditions:  immune system problems  other chronic illness  an unusual or allergic reaction to amphotericin B, other medicines, foods, dyes or preservatives  pregnant or trying to get pregnant  breast-feeding How should I use this medicine? This medicine is for infusion into a vein. It is usually given by a health care professional in a hospital or clinic setting. If you get this medicine at home, you will be taught how to prepare and give this medicine. Use exactly as directed. Take your medicine at regular intervals. Do not take your medicine more often than directed. It is important that you put your used needles and syringes in a special sharps container. Do not put them in a trash can. If you do not have a sharps container, call your pharmacist or healthcare provider to get one. Talk to your pediatrician regarding the use of this medicine in children. Special care may be needed. Overdosage: If you think you have taken too much of this medicine contact a poison control center or emergency room at once. NOTE: This medicine is only for you. Do not share this medicine with others. What if I miss a dose? If you miss a dose, use it as soon as you can. If it is almost time for your next dose, use only that dose. Do not use double or extra doses. What may interact with this medicine? Do not take this medicine with any of the following medications:  cidofovir This medicine may also interact with the following  medications:  certain antibiotics given by injection  corticotropin  cyclosporine  digoxin  entecavir  flucytosine  medicines for fungal infections  medicines to treat cancer  muscle relaxers  pentamidine  steroid medicines like prednisone or cortisone This list may not describe all possible interactions. Give your health care provider a list of all the medicines, herbs, non-prescription drugs, or dietary supplements you use. Also tell them if you smoke, drink alcohol, or use illegal drugs. Some items may interact with your medicine. What should I watch for while using this medicine? Your condition will be monitored carefully while you are receiving this medicine. Tell your doctor or health care professional if your symptoms do not improve or if you get new symptoms. What side effects may I notice from receiving this medicine? Side effects that you should report to your doctor or health care professional as soon as possible:  allergic reactions like skin rash or itching, hives, swelling of the lips, mouth, tongue, or throat  anxiety, confusion  breathing trouble  burning, numbness, tingling  change in amount or color of urine  change in hearing, vision  chest tightness  fever, chills, infection  irregular heart beat, blood pressure  seizure  unusual bleeding, bruising  unusually weak or tired Side effects that usually do not require medical attention (report to your doctor or health care professional if they continue or are bothersome):  body aches, pain  cough  diarrhea  headache  flushing, redness of skin  nausea, vomiting  pain where injected  stomach pain  trouble sleeping This list may not describe all possible side effects. Call your doctor for medical advice about side effects. You may report side effects to FDA at 1-800-FDA-1088. Where should I keep my medicine? Keep out of the reach of children. If you are using this medicine at home,  you will be instructed on how to store this medicine. Throw away any unused medicine after the expiration date on the label. NOTE: This sheet is a summary. It may not cover all possible information. If you have questions about this medicine, talk to your doctor, pharmacist, or health care provider.  2020 Elsevier/Gold Standard (2007-12-20 10:47:54)

## 2019-07-31 NOTE — Progress Notes (Signed)
CRITICAL VALUE ALERT  Critical Value:  Potassium 2.6  Date & Time Notied:  07/31/2019; 12:46 pm  Provider Notified: Scharlene Gloss MD  Orders Received/Actions taken: Per MD, patiently is currently taking potassium. MD will call patient if there is a need to alter the potassium dose.

## 2019-08-01 ENCOUNTER — Ambulatory Visit (HOSPITAL_COMMUNITY)
Admission: RE | Admit: 2019-08-01 | Discharge: 2019-08-01 | Disposition: A | Discharge status: Home / Self Care | Payer: Medicaid Other | Source: Ambulatory Visit | Attending: Internal Medicine | Admitting: Internal Medicine

## 2019-08-01 ENCOUNTER — Telehealth: Payer: Self-pay | Admitting: *Deleted

## 2019-08-01 DIAGNOSIS — B370 Candidal stomatitis: Secondary | ICD-10-CM | POA: Diagnosis not present

## 2019-08-01 MED ORDER — DEXTROSE 5 % IV SOLN
350.0000 mg | INTRAVENOUS | Status: DC
  Administered 2019-08-01: 350 mg via INTRAVENOUS
  Filled 2019-08-01: qty 87.5

## 2019-08-01 MED ORDER — HEPARIN SOD (PORK) LOCK FLUSH 100 UNIT/ML IV SOLN
250.0000 [IU] | INTRAVENOUS | Status: AC | PRN
  Administered 2019-08-01: 250 [IU]

## 2019-08-01 MED ORDER — SODIUM CHLORIDE 0.9 % IV BOLUS FOR AMBISOME
500.0000 mL | INTRAVENOUS | Status: DC
  Administered 2019-08-01: 500 mL via INTRAVENOUS

## 2019-08-01 MED ORDER — DEXTROSE 5% FOR FLUSHING BEFORE AND AFTER AMBISOME
10.0000 mL | INTRAVENOUS | Status: DC
  Administered 2019-08-01: 10 mL via INTRAVENOUS
  Filled 2019-08-01: qty 50

## 2019-08-01 MED ORDER — SODIUM CHLORIDE 0.9% FLUSH
10.0000 mL | INTRAVENOUS | Status: AC | PRN
  Administered 2019-08-01: 10 mL

## 2019-08-01 NOTE — Telephone Encounter (Signed)
Left message for patient letting him know Dr Comer's recommendations for increased potassium and magnesium supplements. Asked patient to call back with any questions. RN received message from [name] (patient's girlfriend) in triage 1/11 asking to relay health updates to patient's daughter Hildred Alamin at [phone].  RN asked patient to confirm this - Hildred Alamin is listed as emergency contact. Landis Gandy, RN     Comer, Okey Regal, MD  P [facility] Triage Nurse Pool  Let him know to take Magnesium (he already has the pills) once a day. Maybe 400 mg daily or whatever he has.  I told the short stay staff already he should increase his KCl to 40 meq twice a day.   thanks

## 2019-08-01 NOTE — Discharge Instructions (Signed)
Amphotericin B Liposomal (LAmB) injection What is this medicine? AMPHOTERICIN (am foe TER i sin) B LIPOSOMAL INJECTION is an antifungal medicine. It is used to treat certain kinds of fungal or yeast infections. This medicine may be used for other purposes; ask your health care provider or pharmacist if you have questions. COMMON BRAND NAME(S): AmBisome What should I tell my health care provider before I take this medicine? They need to know if you have any of these conditions:  immune system problems  other chronic illness  an unusual or allergic reaction to amphotericin B, other medicines, foods, dyes or preservatives  pregnant or trying to get pregnant  breast-feeding How should I use this medicine? This medicine is for infusion into a vein. It is usually given by a health care professional in a hospital or clinic setting. If you get this medicine at home, you will be taught how to prepare and give this medicine. Use exactly as directed. Take your medicine at regular intervals. Do not take your medicine more often than directed. It is important that you put your used needles and syringes in a special sharps container. Do not put them in a trash can. If you do not have a sharps container, call your pharmacist or healthcare provider to get one. Talk to your pediatrician regarding the use of this medicine in children. Special care may be needed. Overdosage: If you think you have taken too much of this medicine contact a poison control center or emergency room at once. NOTE: This medicine is only for you. Do not share this medicine with others. What if I miss a dose? If you miss a dose, use it as soon as you can. If it is almost time for your next dose, use only that dose. Do not use double or extra doses. What may interact with this medicine? Do not take this medicine with any of the following medications:  cidofovir This medicine may also interact with the following  medications:  certain antibiotics given by injection  corticotropin  cyclosporine  digoxin  entecavir  flucytosine  medicines for fungal infections  medicines to treat cancer  muscle relaxers  pentamidine  steroid medicines like prednisone or cortisone This list may not describe all possible interactions. Give your health care provider a list of all the medicines, herbs, non-prescription drugs, or dietary supplements you use. Also tell them if you smoke, drink alcohol, or use illegal drugs. Some items may interact with your medicine. What should I watch for while using this medicine? Your condition will be monitored carefully while you are receiving this medicine. Tell your doctor or health care professional if your symptoms do not improve or if you get new symptoms. What side effects may I notice from receiving this medicine? Side effects that you should report to your doctor or health care professional as soon as possible:  allergic reactions like skin rash or itching, hives, swelling of the lips, mouth, tongue, or throat  anxiety, confusion  breathing trouble  burning, numbness, tingling  change in amount or color of urine  change in hearing, vision  chest tightness  fever, chills, infection  irregular heart beat, blood pressure  seizure  unusual bleeding, bruising  unusually weak or tired Side effects that usually do not require medical attention (report to your doctor or health care professional if they continue or are bothersome):  body aches, pain  cough  diarrhea  headache  flushing, redness of skin  nausea, vomiting  pain where injected  stomach pain  trouble sleeping This list may not describe all possible side effects. Call your doctor for medical advice about side effects. You may report side effects to FDA at 1-800-FDA-1088. Where should I keep my medicine? Keep out of the reach of children. If you are using this medicine at home,  you will be instructed on how to store this medicine. Throw away any unused medicine after the expiration date on the label. NOTE: This sheet is a summary. It may not cover all possible information. If you have questions about this medicine, talk to your doctor, pharmacist, or health care provider.  2020 Elsevier/Gold Standard (2007-12-20 10:47:54)

## 2019-08-01 NOTE — Telephone Encounter (Signed)
-----   Message from Thayer Headings, MD sent at 07/31/2019  2:15 PM EST ----- Let him know to increase his potassium to 40 mEq 3 times a day for this week, in place of twice a day.  Also to keep taking Magnesium. thanks

## 2019-08-01 NOTE — Progress Notes (Signed)
PATIENT CARE CENTER NOTE  Diagnosis:Thrush; Fungal Infection (B37.0)   Provider:Comer, Herbie [place], MD   Procedure:Ambisome infusion   Note:Patient receivedtenthdose ofAmbisome via right arm PICC. 500 cc bolus given pre and post-infusion per order. Patient tolerated infusion well.Vital signs stable. Discharge instructions given. Patient is to receive a total of 14 doses.Alert, oriented and ambulatory at discharge.

## 2019-08-02 ENCOUNTER — Encounter: Payer: Self-pay | Admitting: Radiation Oncology

## 2019-08-02 ENCOUNTER — Ambulatory Visit (HOSPITAL_COMMUNITY)
Admission: RE | Admit: 2019-08-02 | Discharge: 2019-08-02 | Disposition: A | Discharge status: Home / Self Care | Payer: Medicaid Other | Source: Ambulatory Visit | Attending: Internal Medicine | Admitting: Internal Medicine

## 2019-08-02 ENCOUNTER — Telehealth: Payer: Self-pay | Admitting: *Deleted

## 2019-08-02 DIAGNOSIS — B370 Candidal stomatitis: Secondary | ICD-10-CM | POA: Diagnosis not present

## 2019-08-02 MED ORDER — DEXTROSE 5% FOR FLUSHING BEFORE AND AFTER AMBISOME
10.0000 mL | INTRAVENOUS | Status: DC
  Administered 2019-08-02: 10 mL via INTRAVENOUS
  Filled 2019-08-02: qty 50

## 2019-08-02 MED ORDER — SODIUM CHLORIDE 0.9 % IV BOLUS FOR AMBISOME
500.0000 mL | INTRAVENOUS | Status: DC
  Administered 2019-08-02: 500 mL via INTRAVENOUS

## 2019-08-02 MED ORDER — SODIUM CHLORIDE 0.9% FLUSH
10.0000 mL | INTRAVENOUS | Status: AC | PRN
  Administered 2019-08-02: 10 mL

## 2019-08-02 MED ORDER — HEPARIN SOD (PORK) LOCK FLUSH 100 UNIT/ML IV SOLN
250.0000 [IU] | INTRAVENOUS | Status: AC | PRN
  Administered 2019-08-02: 250 [IU]
  Filled 2019-08-02: qty 5

## 2019-08-02 MED ORDER — DEXTROSE 5 % IV SOLN
350.0000 mg | INTRAVENOUS | Status: DC
  Administered 2019-08-02: 350 mg via INTRAVENOUS
  Filled 2019-08-02: qty 87.5

## 2019-08-02 NOTE — Progress Notes (Signed)
PATIENT CARE CENTER NOTE  Diagnosis: Thrush, fungal Infection   Provider: Scharlene Gloss MD   Procedure: Ambisome infusion   Note: Patient received ambisome via right arm PICC. 500 cc normal saline bolus given pre and post infusions per order. Vital signs stable. Discharge instructions given. Patient is to receive a total of 14 doses. Alert, oriented and ambulatory at discharge.

## 2019-08-02 NOTE — Discharge Instructions (Signed)
Amphotericin B Liposomal (LAmB) injection What is this medicine? AMPHOTERICIN (am foe TER i sin) B LIPOSOMAL INJECTION is an antifungal medicine. It is used to treat certain kinds of fungal or yeast infections. This medicine may be used for other purposes; ask your health care provider or pharmacist if you have questions. COMMON BRAND NAME(S): AmBisome What should I tell my health care provider before I take this medicine? They need to know if you have any of these conditions:  immune system problems  other chronic illness  an unusual or allergic reaction to amphotericin B, other medicines, foods, dyes or preservatives  pregnant or trying to get pregnant  breast-feeding How should I use this medicine? This medicine is for infusion into a vein. It is usually given by a health care professional in a hospital or clinic setting. If you get this medicine at home, you will be taught how to prepare and give this medicine. Use exactly as directed. Take your medicine at regular intervals. Do not take your medicine more often than directed. It is important that you put your used needles and syringes in a special sharps container. Do not put them in a trash can. If you do not have a sharps container, call your pharmacist or healthcare provider to get one. Talk to your pediatrician regarding the use of this medicine in children. Special care may be needed. Overdosage: If you think you have taken too much of this medicine contact a poison control center or emergency room at once. NOTE: This medicine is only for you. Do not share this medicine with others. What if I miss a dose? If you miss a dose, use it as soon as you can. If it is almost time for your next dose, use only that dose. Do not use double or extra doses. What may interact with this medicine? Do not take this medicine with any of the following medications:  cidofovir This medicine may also interact with the following  medications:  certain antibiotics given by injection  corticotropin  cyclosporine  digoxin  entecavir  flucytosine  medicines for fungal infections  medicines to treat cancer  muscle relaxers  pentamidine  steroid medicines like prednisone or cortisone This list may not describe all possible interactions. Give your health care provider a list of all the medicines, herbs, non-prescription drugs, or dietary supplements you use. Also tell them if you smoke, drink alcohol, or use illegal drugs. Some items may interact with your medicine. What should I watch for while using this medicine? Your condition will be monitored carefully while you are receiving this medicine. Tell your doctor or health care professional if your symptoms do not improve or if you get new symptoms. What side effects may I notice from receiving this medicine? Side effects that you should report to your doctor or health care professional as soon as possible:  allergic reactions like skin rash or itching, hives, swelling of the lips, mouth, tongue, or throat  anxiety, confusion  breathing trouble  burning, numbness, tingling  change in amount or color of urine  change in hearing, vision  chest tightness  fever, chills, infection  irregular heart beat, blood pressure  seizure  unusual bleeding, bruising  unusually weak or tired Side effects that usually do not require medical attention (report to your doctor or health care professional if they continue or are bothersome):  body aches, pain  cough  diarrhea  headache  flushing, redness of skin  nausea, vomiting  pain where injected  stomach pain  trouble sleeping This list may not describe all possible side effects. Call your doctor for medical advice about side effects. You may report side effects to FDA at 1-800-FDA-1088. Where should I keep my medicine? Keep out of the reach of children. If you are using this medicine at home,  you will be instructed on how to store this medicine. Throw away any unused medicine after the expiration date on the label. NOTE: This sheet is a summary. It may not cover all possible information. If you have questions about this medicine, talk to your doctor, pharmacist, or health care provider.  2020 Elsevier/Gold Standard (2007-12-20 10:47:54)

## 2019-08-02 NOTE — Progress Notes (Addendum)
Patient sees Dr. Posey Pronto tomorrow.  Note emailed to his colleague to share w/ Dr Posey Pronto as I do not have his contact info; I also called patient and left a VM notifying him about my discussions w/ the team:  [facility], thanks for fwding this to Dr. Harvin Hazel:  [name] [name] Apr 11, 2061 DOB sees him tomorrow. Dr. Benjamine Mola referred him.  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  Saw Dr. Benjamine Mola but worsening trismus made good exam, let alone biopsy, too difficult.   Please let [name] know that trismus, imaging findings, and reported throat pain make [place] uncomfortable with continuing surveillance imaging alone.  He still smokes, of note.  Can you pursue biopsy to r/o progression/recurrence?  Of note, patient is getting Amphotericin B for ? oral Thrush with no response; ID suspects the mucosal changes may be non infectious.  Thanks! Images below. [name]

## 2019-08-02 NOTE — Telephone Encounter (Signed)
Called patient to inform of fu appt. with Dr. Isidore Moos on 05-01-20 @ 11 am, lvm for a return call

## 2019-08-02 NOTE — Telephone Encounter (Signed)
Connected with patient. He has had to move some infusion appointments around to accommodate weekends and his ENT appointment at [facility], will complete infusions 1/19.  He is still not really seeing notable improvement in thrush. He got the voicemail about how to increase his potassium and magnesium. He stated Hildred Alamin is up to date on everything, no need to give her a call. Landis Gandy, RN

## 2019-08-03 ENCOUNTER — Inpatient Hospital Stay (HOSPITAL_COMMUNITY)
Admission: RE | Admit: 2019-08-03 | Discharge: 2019-08-03 | Disposition: A | Discharge status: Home / Self Care | Payer: Medicaid Other | Source: Ambulatory Visit

## 2019-08-03 DIAGNOSIS — Z85818 Personal history of malignant neoplasm of other sites of lip, oral cavity, and pharynx: Secondary | ICD-10-CM | POA: Diagnosis not present

## 2019-08-03 DIAGNOSIS — B3789 Other sites of candidiasis: Secondary | ICD-10-CM | POA: Diagnosis not present

## 2019-08-03 DIAGNOSIS — Z9889 Other specified postprocedural states: Secondary | ICD-10-CM | POA: Diagnosis not present

## 2019-08-03 DIAGNOSIS — L598 Other specified disorders of the skin and subcutaneous tissue related to radiation: Secondary | ICD-10-CM | POA: Diagnosis not present

## 2019-08-03 DIAGNOSIS — Z7901 Long term (current) use of anticoagulants: Secondary | ICD-10-CM | POA: Diagnosis not present

## 2019-08-03 DIAGNOSIS — C024 Malignant neoplasm of lingual tonsil: Secondary | ICD-10-CM | POA: Diagnosis not present

## 2019-08-03 DIAGNOSIS — Z9221 Personal history of antineoplastic chemotherapy: Secondary | ICD-10-CM | POA: Diagnosis not present

## 2019-08-03 DIAGNOSIS — Z923 Personal history of irradiation: Secondary | ICD-10-CM | POA: Diagnosis not present

## 2019-08-03 DIAGNOSIS — Z86718 Personal history of other venous thrombosis and embolism: Secondary | ICD-10-CM | POA: Diagnosis not present

## 2019-08-03 DIAGNOSIS — Z87891 Personal history of nicotine dependence: Secondary | ICD-10-CM | POA: Diagnosis not present

## 2019-08-03 DIAGNOSIS — R070 Pain in throat: Secondary | ICD-10-CM | POA: Diagnosis not present

## 2019-08-04 ENCOUNTER — Ambulatory Visit (HOSPITAL_COMMUNITY)
Admission: RE | Admit: 2019-08-04 | Discharge: 2019-08-04 | Disposition: A | Discharge status: Home / Self Care | Payer: Medicaid Other | Source: Ambulatory Visit | Attending: Internal Medicine | Admitting: Internal Medicine

## 2019-08-04 ENCOUNTER — Other Ambulatory Visit: Payer: Self-pay

## 2019-08-04 DIAGNOSIS — B370 Candidal stomatitis: Secondary | ICD-10-CM | POA: Diagnosis not present

## 2019-08-04 LAB — CBC
HCT: 40.9 % (ref 39.0–52.0)
Hemoglobin: 13.4 g/dL (ref 13.0–17.0)
MCH: 30.2 pg (ref 26.0–34.0)
MCHC: 32.8 g/dL (ref 30.0–36.0)
MCV: 92.3 fL (ref 80.0–100.0)
Platelets: 282 10*3/uL (ref 150–400)
RBC: 4.43 MIL/uL (ref 4.22–5.81)
RDW: 12.9 % (ref 11.5–15.5)
WBC: 8.1 10*3/uL (ref 4.0–10.5)
nRBC: 0 % (ref 0.0–0.2)

## 2019-08-04 LAB — BASIC METABOLIC PANEL
Anion gap: 13 (ref 5–15)
BUN: 16 mg/dL (ref 6–20)
CO2: 27 mmol/L (ref 22–32)
Calcium: 9 mg/dL (ref 8.9–10.3)
Chloride: 96 mmol/L — ABNORMAL LOW (ref 98–111)
Creatinine, Ser: 1.18 mg/dL (ref 0.61–1.24)
GFR calc Af Amer: >60 mL/min (ref >60)
GFR calc non Af Amer: >60 mL/min (ref >60)
Glucose, Bld: 103 mg/dL — ABNORMAL HIGH (ref 70–99)
Potassium: 2.7 mmol/L — CL (ref 3.5–5.1)
Sodium: 136 mmol/L (ref 135–145)

## 2019-08-04 LAB — MAGNESIUM: Magnesium: 1.4 mg/dL — ABNORMAL LOW (ref 1.7–2.4)

## 2019-08-04 MED ORDER — SODIUM CHLORIDE 0.9 % IV BOLUS FOR AMBISOME
500.0000 mL | INTRAVENOUS | Status: DC
  Administered 2019-08-04: 500 mL via INTRAVENOUS

## 2019-08-04 MED ORDER — DEXTROSE 5% FOR FLUSHING BEFORE AND AFTER AMBISOME
10.0000 mL | INTRAVENOUS | Status: DC
  Administered 2019-08-04: 10 mL via INTRAVENOUS
  Filled 2019-08-04: qty 50

## 2019-08-04 MED ORDER — HEPARIN SOD (PORK) LOCK FLUSH 100 UNIT/ML IV SOLN
250.0000 [IU] | INTRAVENOUS | Status: AC | PRN
  Administered 2019-08-04: 250 [IU]
  Filled 2019-08-04: qty 5

## 2019-08-04 MED ORDER — SODIUM CHLORIDE 0.9% FLUSH
10.0000 mL | INTRAVENOUS | Status: AC | PRN
  Administered 2019-08-04: 10 mL

## 2019-08-04 MED ORDER — DEXTROSE 5 % IV SOLN
350.0000 mg | INTRAVENOUS | Status: DC
  Administered 2019-08-04: 350 mg via INTRAVENOUS
  Filled 2019-08-04: qty 87.5

## 2019-08-04 NOTE — Progress Notes (Signed)
PATIENT CARE CENTER NOTE  Diagnosis:Thrush; Fungal Infection (B37.0)   Provider:Comer, Herbie [place], MD   Procedure:Ambisome infusion and lab draw   Note:Patient receivedtwelfthdose ofAmbisome via right arm PICC. 500 cc bolus given pre and post-infusion per order. Patient tolerated infusion well.Labs drawn from PICC pre-infusion. Vital signs stable. Discharge instructions given. Patient is to receive a total of 14 doses.Alert, oriented and ambulatory at discharge.

## 2019-08-04 NOTE — Progress Notes (Signed)
CRITICAL VALUE ALERT  Critical Value:  Potassium 2.7  Date & Time Notied:  08/04/19 at 11:35 am  Provider Notified: NA  Orders Received/Actions taken: Provider, Scharlene Gloss MD, aware of patient's low potassium level and is advising patient.

## 2019-08-04 NOTE — Discharge Instructions (Signed)
Amphotericin B Liposomal (LAmB) injection What is this medicine? AMPHOTERICIN (am foe TER i sin) B LIPOSOMAL INJECTION is an antifungal medicine. It is used to treat certain kinds of fungal or yeast infections. This medicine may be used for other purposes; ask your health care provider or pharmacist if you have questions. COMMON BRAND NAME(S): AmBisome What should I tell my health care provider before I take this medicine? They need to know if you have any of these conditions:  immune system problems  other chronic illness  an unusual or allergic reaction to amphotericin B, other medicines, foods, dyes or preservatives  pregnant or trying to get pregnant  breast-feeding How should I use this medicine? This medicine is for infusion into a vein. It is usually given by a health care professional in a hospital or clinic setting. If you get this medicine at home, you will be taught how to prepare and give this medicine. Use exactly as directed. Take your medicine at regular intervals. Do not take your medicine more often than directed. It is important that you put your used needles and syringes in a special sharps container. Do not put them in a trash can. If you do not have a sharps container, call your pharmacist or healthcare provider to get one. Talk to your pediatrician regarding the use of this medicine in children. Special care may be needed. Overdosage: If you think you have taken too much of this medicine contact a poison control center or emergency room at once. NOTE: This medicine is only for you. Do not share this medicine with others. What if I miss a dose? If you miss a dose, use it as soon as you can. If it is almost time for your next dose, use only that dose. Do not use double or extra doses. What may interact with this medicine? Do not take this medicine with any of the following medications:  cidofovir This medicine may also interact with the following  medications:  certain antibiotics given by injection  corticotropin  cyclosporine  digoxin  entecavir  flucytosine  medicines for fungal infections  medicines to treat cancer  muscle relaxers  pentamidine  steroid medicines like prednisone or cortisone This list may not describe all possible interactions. Give your health care provider a list of all the medicines, herbs, non-prescription drugs, or dietary supplements you use. Also tell them if you smoke, drink alcohol, or use illegal drugs. Some items may interact with your medicine. What should I watch for while using this medicine? Your condition will be monitored carefully while you are receiving this medicine. Tell your doctor or health care professional if your symptoms do not improve or if you get new symptoms. What side effects may I notice from receiving this medicine? Side effects that you should report to your doctor or health care professional as soon as possible:  allergic reactions like skin rash or itching, hives, swelling of the lips, mouth, tongue, or throat  anxiety, confusion  breathing trouble  burning, numbness, tingling  change in amount or color of urine  change in hearing, vision  chest tightness  fever, chills, infection  irregular heart beat, blood pressure  seizure  unusual bleeding, bruising  unusually weak or tired Side effects that usually do not require medical attention (report to your doctor or health care professional if they continue or are bothersome):  body aches, pain  cough  diarrhea  headache  flushing, redness of skin  nausea, vomiting  pain where injected  stomach pain  trouble sleeping This list may not describe all possible side effects. Call your doctor for medical advice about side effects. You may report side effects to FDA at 1-800-FDA-1088. Where should I keep my medicine? Keep out of the reach of children. If you are using this medicine at home,  you will be instructed on how to store this medicine. Throw away any unused medicine after the expiration date on the label. NOTE: This sheet is a summary. It may not cover all possible information. If you have questions about this medicine, talk to your doctor, pharmacist, or health care provider.  2020 Elsevier/Gold Standard (2007-12-20 10:47:54)

## 2019-08-07 ENCOUNTER — Other Ambulatory Visit: Payer: Self-pay

## 2019-08-07 ENCOUNTER — Ambulatory Visit (HOSPITAL_COMMUNITY)
Admission: RE | Admit: 2019-08-07 | Discharge: 2019-08-07 | Disposition: A | Discharge status: Home / Self Care | Payer: Medicaid Other | Source: Ambulatory Visit | Attending: Internal Medicine | Admitting: Internal Medicine

## 2019-08-07 ENCOUNTER — Other Ambulatory Visit: Payer: Self-pay | Admitting: Radiation Oncology

## 2019-08-07 ENCOUNTER — Telehealth: Payer: Self-pay | Admitting: *Deleted

## 2019-08-07 DIAGNOSIS — B370 Candidal stomatitis: Secondary | ICD-10-CM | POA: Diagnosis not present

## 2019-08-07 DIAGNOSIS — C090 Malignant neoplasm of tonsillar fossa: Secondary | ICD-10-CM

## 2019-08-07 LAB — CBC
HCT: 38.5 % — ABNORMAL LOW (ref 39.0–52.0)
Hemoglobin: 12.9 g/dL — ABNORMAL LOW (ref 13.0–17.0)
MCH: 30.8 pg (ref 26.0–34.0)
MCHC: 33.5 g/dL (ref 30.0–36.0)
MCV: 91.9 fL (ref 80.0–100.0)
Platelets: 270 10*3/uL (ref 150–400)
RBC: 4.19 MIL/uL — ABNORMAL LOW (ref 4.22–5.81)
RDW: 13.1 % (ref 11.5–15.5)
WBC: 8 10*3/uL (ref 4.0–10.5)
nRBC: 0 % (ref 0.0–0.2)

## 2019-08-07 MED ORDER — SODIUM CHLORIDE 0.9 % IV BOLUS FOR AMBISOME
500.0000 mL | INTRAVENOUS | Status: DC
  Administered 2019-08-07: 500 mL via INTRAVENOUS

## 2019-08-07 MED ORDER — HEPARIN SOD (PORK) LOCK FLUSH 100 UNIT/ML IV SOLN
250.0000 [IU] | INTRAVENOUS | Status: AC | PRN
  Administered 2019-08-07: 250 [IU]

## 2019-08-07 MED ORDER — SODIUM CHLORIDE 0.9% FLUSH
10.0000 mL | INTRAVENOUS | Status: AC | PRN
  Administered 2019-08-07: 10 mL

## 2019-08-07 MED ORDER — DEXTROSE 5 % IV SOLN
350.0000 mg | INTRAVENOUS | Status: DC
  Administered 2019-08-07: 350 mg via INTRAVENOUS
  Filled 2019-08-07: qty 87.5

## 2019-08-07 MED ORDER — DEXTROSE 5% FOR FLUSHING BEFORE AND AFTER AMBISOME
10.0000 mL | INTRAVENOUS | Status: DC
  Administered 2019-08-07: 10 mL via INTRAVENOUS
  Filled 2019-08-07: qty 50

## 2019-08-07 MED ORDER — HEPARIN SOD (PORK) LOCK FLUSH 100 UNIT/ML IV SOLN: 500.0000 [IU] | INTRAVENOUS | Status: DC | PRN

## 2019-08-07 NOTE — Telephone Encounter (Signed)
Left message on patient's voicemail, asked him to call back and confirm. Landis Gandy, RN

## 2019-08-07 NOTE — Progress Notes (Signed)
PATIENT CARE CENTER NOTE  Diagnosis: Thrush, Fungal infection   Provider: Scharlene Gloss MD   Procedure: Ambisome infusion and lab draw   Note: Patient received Ambisome via a right arm PICC line. 500 cc bolus given pre and post infusion per order. Tolerated well, vitals stable, discharge instructions given, verbalized understanding. Labs drawn from [facility] pre infusion. Patient alert, oriented and ambulatory at the time of discharge.

## 2019-08-07 NOTE — Telephone Encounter (Signed)
-----   Message from Thayer Headings, MD sent at 08/07/2019  9:04 AM EST ----- He should be finishing tomorrow, I believe with his amphotericin.  Have him keep taking the KCl three times a day and Mag and repeat a BMP and Mag on Thursday thanks

## 2019-08-08 ENCOUNTER — Ambulatory Visit: Payer: Medicaid Other | Admitting: Physical Therapy

## 2019-08-08 ENCOUNTER — Ambulatory Visit (HOSPITAL_COMMUNITY)
Admission: RE | Admit: 2019-08-08 | Discharge: 2019-08-08 | Disposition: A | Discharge status: Home / Self Care | Payer: Medicaid Other | Source: Ambulatory Visit | Attending: Internal Medicine | Admitting: Internal Medicine

## 2019-08-08 DIAGNOSIS — B370 Candidal stomatitis: Secondary | ICD-10-CM | POA: Diagnosis not present

## 2019-08-08 MED ORDER — SODIUM CHLORIDE 0.9% FLUSH
10.0000 mL | INTRAVENOUS | Status: AC | PRN
  Administered 2019-08-08: 10 mL

## 2019-08-08 MED ORDER — DEXTROSE 5% FOR FLUSHING BEFORE AND AFTER AMBISOME
10.0000 mL | INTRAVENOUS | Status: DC
  Administered 2019-08-08: 10 mL via INTRAVENOUS
  Filled 2019-08-08: qty 50

## 2019-08-08 MED ORDER — SODIUM CHLORIDE 0.9 % IV BOLUS FOR AMBISOME
500.0000 mL | INTRAVENOUS | Status: DC
  Administered 2019-08-08: 500 mL via INTRAVENOUS

## 2019-08-08 MED ORDER — DEXTROSE 5 % IV SOLN
350.0000 mg | INTRAVENOUS | Status: DC
  Administered 2019-08-08: 350 mg via INTRAVENOUS
  Filled 2019-08-08: qty 87.5

## 2019-08-08 MED ORDER — HEPARIN SOD (PORK) LOCK FLUSH 100 UNIT/ML IV SOLN
250.0000 [IU] | INTRAVENOUS | Status: AC | PRN
  Administered 2019-08-08: 250 [IU]
  Filled 2019-08-08: qty 5

## 2019-08-08 MED ORDER — DEXTROSE 5% FOR FLUSHING BEFORE AND AFTER AMBISOME
10.0000 mL | INTRAVENOUS | Status: DC
  Administered 2019-08-08: 10 mL via INTRAVENOUS
  Filled 2019-08-08: qty 10
  Filled 2019-08-08: qty 50

## 2019-08-08 NOTE — Discharge Instructions (Signed)
Amphotericin B Liposomal (LAmB) injection What is this medicine? AMPHOTERICIN (am foe TER i sin) B LIPOSOMAL INJECTION is an antifungal medicine. It is used to treat certain kinds of fungal or yeast infections. This medicine may be used for other purposes; ask your health care provider or pharmacist if you have questions. COMMON BRAND NAME(S): AmBisome What should I tell my health care provider before I take this medicine? They need to know if you have any of these conditions:  immune system problems  other chronic illness  an unusual or allergic reaction to amphotericin B, other medicines, foods, dyes or preservatives  pregnant or trying to get pregnant  breast-feeding How should I use this medicine? This medicine is for infusion into a vein. It is usually given by a health care professional in a hospital or clinic setting. If you get this medicine at home, you will be taught how to prepare and give this medicine. Use exactly as directed. Take your medicine at regular intervals. Do not take your medicine more often than directed. It is important that you put your used needles and syringes in a special sharps container. Do not put them in a trash can. If you do not have a sharps container, call your pharmacist or healthcare provider to get one. Talk to your pediatrician regarding the use of this medicine in children. Special care may be needed. Overdosage: If you think you have taken too much of this medicine contact a poison control center or emergency room at once. NOTE: This medicine is only for you. Do not share this medicine with others. What if I miss a dose? If you miss a dose, use it as soon as you can. If it is almost time for your next dose, use only that dose. Do not use double or extra doses. What may interact with this medicine? Do not take this medicine with any of the following medications:  cidofovir This medicine may also interact with the following  medications:  certain antibiotics given by injection  corticotropin  cyclosporine  digoxin  entecavir  flucytosine  medicines for fungal infections  medicines to treat cancer  muscle relaxers  pentamidine  steroid medicines like prednisone or cortisone This list may not describe all possible interactions. Give your health care provider a list of all the medicines, herbs, non-prescription drugs, or dietary supplements you use. Also tell them if you smoke, drink alcohol, or use illegal drugs. Some items may interact with your medicine. What should I watch for while using this medicine? Your condition will be monitored carefully while you are receiving this medicine. Tell your doctor or health care professional if your symptoms do not improve or if you get new symptoms. What side effects may I notice from receiving this medicine? Side effects that you should report to your doctor or health care professional as soon as possible:  allergic reactions like skin rash or itching, hives, swelling of the lips, mouth, tongue, or throat  anxiety, confusion  breathing trouble  burning, numbness, tingling  change in amount or color of urine  change in hearing, vision  chest tightness  fever, chills, infection  irregular heart beat, blood pressure  seizure  unusual bleeding, bruising  unusually weak or tired Side effects that usually do not require medical attention (report to your doctor or health care professional if they continue or are bothersome):  body aches, pain  cough  diarrhea  headache  flushing, redness of skin  nausea, vomiting  pain where injected  stomach pain  trouble sleeping This list may not describe all possible side effects. Call your doctor for medical advice about side effects. You may report side effects to FDA at 1-800-FDA-1088. Where should I keep my medicine? Keep out of the reach of children. If you are using this medicine at home,  you will be instructed on how to store this medicine. Throw away any unused medicine after the expiration date on the label. NOTE: This sheet is a summary. It may not cover all possible information. If you have questions about this medicine, talk to your doctor, pharmacist, or health care provider.  2020 Elsevier/Gold Standard (2007-12-20 10:47:54)

## 2019-08-08 NOTE — Telephone Encounter (Addendum)
Spoke with [name] [name] and [name]. He completed IV amphotericin today. He still has his [facility] in place. He will come to [facility] for lab on Thursday.  He still has white plaques present on his tongue. He worries that the amphotericin did not clear the thrush.  RN scheduled patient for follow up with Dr Linus Salmons 2/1 at 3:00. He also reports thick mucous that he can blow out of his mouth (clear to pink tinged). During the phone call, it sounds like he is blowing a heavy amount of mucous from his nose, but he states this is actually coming from his mouth. He saw ENT last week, but they only scoped his tonsil area. They did not address cyst in his sinus, new mucous issue, ear/throat pain, sinus pain/pressure.  He wonders if this could be a dental issue or sinus infection causing his jaw/ear/throat/temple/sinus pain.   1) he is taking magnesium/potassium as advised, worries that there will not be much difference by Thursday.  Would like to know if he should wait until his coumadin clinic appointment Monday 1/25.  If PO supplements are not working, he is willing to go for infusion of IV electrolyte replacement. Please advise.  2) he feels his care is quite fractured, does not know where to go for his questions/symptoms.  He is worried that the specialties are not speaking with each other.  Per chart, Dr Isidore Moos is emailing providers. Landis Gandy, RN

## 2019-08-08 NOTE — Progress Notes (Signed)
PATIENT CARE CENTER NOTE  Diagnosis:Thrush; Fungal Infection (B37.0)   Provider:Comer, Herbie [place], MD   Procedure:Ambisome infusion and PICC dressing change   Note:Patient received14th and finial dose ofAmbisome via right arm PICC. 500 cc bolus given pre and post-infusion per order. Patient tolerated infusion well. Vital signs stable. PICC line dressing changed using sterile technique. Discharge instructions given.Alert, oriented and ambulatory at discharge.

## 2019-08-09 NOTE — Telephone Encounter (Signed)
Thanks for the update.  If he wants to wait until 1/25 for the blood that is fine with me.  IF the amphotericin did not get rid of the white coating, it is not an infection and not much more for me to do.  A biopsy may be the next step by ENT, but will defer to them.  As you said, Dr. Isidore Moos has been keeping the communication together.   His symptoms are ENT related and Dr. Benjamine Mola referred him to [facility].  There is no note available in CareEverywhere from the visit so I do not now of what their thoughts are.   I do not think he has a different infection, nothing further from an ID standpoint.

## 2019-08-09 NOTE — Telephone Encounter (Signed)
Left message letting him know he can postpone lab draw. Relayed information about white patches/biopsy by ENT.  Offered to send notes to ENT. Asked him to call back in the am, to keep flushing his PICC as directed.

## 2019-08-10 ENCOUNTER — Other Ambulatory Visit: Payer: Medicaid Other

## 2019-08-14 ENCOUNTER — Ambulatory Visit (INDEPENDENT_AMBULATORY_CARE_PROVIDER_SITE_OTHER): Payer: Medicaid Other | Admitting: Pharmacist

## 2019-08-14 DIAGNOSIS — Z7901 Long term (current) use of anticoagulants: Secondary | ICD-10-CM | POA: Diagnosis not present

## 2019-08-14 DIAGNOSIS — Z5181 Encounter for therapeutic drug level monitoring: Secondary | ICD-10-CM

## 2019-08-14 DIAGNOSIS — Z86718 Personal history of other venous thrombosis and embolism: Secondary | ICD-10-CM

## 2019-08-14 LAB — POCT INR: INR: 4.5 — AB (ref 2.0–3.0)

## 2019-08-14 NOTE — Patient Instructions (Signed)
Patient instructed to take medications as defined in the Anti-coagulation Track section of this encounter.  Patient instructed to OMIT today's dose.  Patient instructed to take  one (1) tablet of your 10mg -white warfarin tablets on Mondays, Wednesdays and Fridays. All OTHER days--take only one-half (1/2) of your 10mg -white warfarin tablets.  Patient verbalized understanding of these instructions.

## 2019-08-14 NOTE — Progress Notes (Signed)
Anticoagulation Management [name] [name] is a 59 y.o. male who reports to the clinic for monitoring of warfarin treatment.    Indication: DVT and PE , History of; Long term current use of anticoagulant.  Duration: indefinite Supervising physician: Lenice Pressman, MD, PhD  Anticoagulation Clinic Visit History: Patient does not report signs/symptoms of bleeding or thromboembolism  Other recent changes: No diet, medications, lifestyle except as noted in patient findings.  Anticoagulation Episode Summary    Current INR goal:  2.5-3.5  TTR:  33.2 % (8.2 y)  Next INR check:  09/04/2019  INR from last check:    Weekly max warfarin dose:    Target end date:    INR check location:    Preferred lab:    Send INR reminders to:  [facility]   Indications   Pulmonary embolism (HCC) (Resolved) [I26.99]       Comments:        Anticoagulation Care Providers    Provider Role Specialty Phone number   Annia Belt, MD Referring Oncology [phone]      No Known Allergies  Current Outpatient Medications:  .  amLODipine (NORVASC) 10 MG tablet, Take 1 tablet (10 mg total) by mouth daily., Disp: 90 tablet, Rfl: 2 .  atorvastatin (LIPITOR) 20 MG tablet, Take 1 tablet (20 mg total) by mouth daily. (Patient taking differently: Take 20 mg by mouth as needed. Few times a week per pt.), Disp: 30 tablet, Rfl: 11 .  HYDROcodone-acetaminophen (NORCO) 10-325 MG tablet, Take 1 tablet by mouth every 8 (eight) hours as needed., Disp: 40 tablet, Rfl: 0 .  warfarin (COUMADIN) 10 MG tablet, Take 1 tablet of 10 mg warfarin daily except on Mondays, Wednesdays and Fridays, take only 1/2 tablet (5 mg)., Disp: 24 tablet, Rfl: 2 .  amoxicillin (AMOXIL) 875 MG tablet, Take 875 mg by mouth 2 (two) times daily., Disp: , Rfl:  .  cyclobenzaprine (FLEXERIL) 10 MG tablet, Take 1 tablet (10 mg total) by mouth 3 (three) times daily as needed for muscle spasms. (Patient not taking: Reported on 08/14/2019), Disp: 30  tablet, Rfl: 0 .  ibuprofen (ADVIL) 200 MG tablet, Take 600 mg by mouth every 6 (six) hours as needed for pain or fever. , Disp: , Rfl:  .  Isavuconazonium Sulfate 186 MG CAPS, Take 1 capsule (186 mg total) by mouth as directed. Load with 2 capsules every 8 hours x 6 doses, then 2 capsules daily the following day (Patient not taking: Reported on 07/24/2019), Disp: 36 capsule, Rfl: 0 .  sodium fluoride (PREVIDENT 5000 PLUS) 1.1 % CREA dental cream, Apply to tooth brush. Brush teeth for 2 minutes. Spit out excess. DO NOT rinse afterwards. Repeat nightly. (Patient not taking: Reported on 08/14/2019), Disp: 1 Tube, Rfl: prn Past Medical History:  Diagnosis Date  . 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 Benign essential HTN 01/04/2012  . Cancer ([place])    tonsilar  . Chronic anticoagulation 01/02/2013  . Dermatofibroma of forearm 01/02/2013   Left side  . Hyperlipidemia, mixed 01/04/2012  . 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 Primary hypercoagulable state ([place]) 10/01/2014  . Sinus bradycardia, chronic 01/04/2012  . Superficial thrombosis of lower extremity 05/02/2012   Social History   Socioeconomic History  . Marital status: Divorced    Spouse name: Not on  file  . Number of children: 2  . Years of education: Not on file  . Highest education level: Not on file  Occupational History  . Not on file  Tobacco Use  . Smoking status: Former Smoker    Packs/day: 0.50    Years: 30.00    Pack years: 15.00    Types: Cigarettes    Quit date: 10/19/2018    Years since quitting: 0.8  . Smokeless tobacco: Never Used  Substance and Sexual Activity  . Alcohol use: Yes    Alcohol/week: 13.0 - 14.0 standard drinks    Types: 12 Cans of beer, 1 - 2 Shots of liquor per week    Comment: 1-2 times per week.  . Drug use: No  . Sexual  activity: Not on file  Other Topics Concern  . Not on file  Social History Narrative  . Not on file   Social Determinants of Health   Financial Resource Strain:   . Difficulty of Paying Living Expenses: Not on file  Food Insecurity:   . Worried About Charity fundraiser in the Last Year: Not on file  . Ran Out of Food in the Last Year: Not on file  Transportation Needs: No Transportation Needs  . Lack of Transportation (Medical): No  . Lack of Transportation (Non-Medical): No  Physical Activity:   . Days of Exercise per Week: Not on file  . Minutes of Exercise per Session: Not on file  Stress:   . Feeling of Stress : Not on file  Social Connections:   . Frequency of Communication with Friends and Family: Not on file  . Frequency of Social Gatherings with Friends and Family: Not on file  . Attends Religious Services: Not on file  . Active Member of Clubs or Organizations: Not on file  . Attends Archivist Meetings: Not on file  . Marital Status: Not on file   Family History  Problem Relation Age of Onset  . Stroke Father     ASSESSMENT Recent Results: The most recent result is correlated with 55 mg per week: Lab Results  Component Value Date   INR 4.5 (A) 08/14/2019   INR 1.9 (A) 07/24/2019   INR 3.0 06/26/2019   PROTIME 28.8 (H) 02/04/2015    Anticoagulation Dosing: Description   Take one (1) tablet of your 10mg -white warfarin tablets on Mondays, Wednesdays and Fridays. All OTHER days--take only one-half (1/2) of your 10mg -white warfarin tablets.      INR today: Supratherapeutic  PLAN Weekly dose was decreased by 10% to 50 mg per week, with TODAY'S dose being held/omitted.   Patient Instructions  Patient instructed to take medications as defined in the Anti-coagulation Track section of this encounter.  Patient instructed to OMIT today's dose.  Patient instructed to take  one (1) tablet of your 10mg -white warfarin tablets on Mondays, Wednesdays and  Fridays. All OTHER days--take only one-half (1/2) of your 10mg -white warfarin tablets.  Patient verbalized understanding of these instructions.    Patient advised to contact clinic or seek medical attention if signs/symptoms of bleeding or thromboembolism occur.  Patient verbalized understanding by repeating back information and was advised to contact me if further medication-related questions arise. Patient was also provided an information handout.  Follow-up Return in 3 weeks (on 09/04/2019) for Follow up INR.  Pennie Banter, PharmD, CPP  15 minutes spent face-to-face with the patient during the encounter. 50% of time spent on education, including signs/sx bleeding and clotting, as  well as food and drug interactions with warfarin. 50% of time was spent on fingerprick POC INR sample collection,processing, results determination, and documentation in http://www.kim.net/.

## 2019-08-15 NOTE — Progress Notes (Signed)
INTERNAL MEDICINE TEACHING ATTENDING ADDENDUM  I agree with pharmacy recommendations as outlined in their note.   Coury Grieger N Gean Larose, MD  

## 2019-08-21 ENCOUNTER — Other Ambulatory Visit: Payer: Self-pay

## 2019-08-21 ENCOUNTER — Encounter: Payer: Self-pay | Admitting: Internal Medicine

## 2019-08-21 ENCOUNTER — Ambulatory Visit (INDEPENDENT_AMBULATORY_CARE_PROVIDER_SITE_OTHER): Payer: Medicaid Other | Admitting: Internal Medicine

## 2019-08-21 VITALS — BP 125/81 | HR 78 | Temp 97.3°F | Resp 12 | Ht 75.0 in | Wt 208.0 lb

## 2019-08-21 DIAGNOSIS — E876 Hypokalemia: Secondary | ICD-10-CM | POA: Diagnosis not present

## 2019-08-21 DIAGNOSIS — B370 Candidal stomatitis: Secondary | ICD-10-CM | POA: Diagnosis not present

## 2019-08-21 NOTE — Assessment & Plan Note (Addendum)
I will check his bmp today to see if he can stop his KCl supplementation.    Addendum: potassium wnl so will stop supplementation

## 2019-08-21 NOTE — Assessment & Plan Note (Addendum)
He is continuing with supplements and will recheck his level today to determine if he needs to continue it.   Addendum: Mag level wnl so will stop supplementation

## 2019-08-21 NOTE — Assessment & Plan Note (Addendum)
It appears this discharge is non infectious at this point as it did not respond to any anti-fungal treatment.  I don't suspect resistance at this point and no other antifungal left to try.  He is followed now by ENT at [facility] and scheduled in 2 weeks for discussion of possible biopsy.   No further intervention from an ID standpoint indicated.   He will follow up PRN

## 2019-08-21 NOTE — Progress Notes (Signed)
   Subjective:    Patient ID: [name] [name], male    DOB: June 07, 1961, 59 y.o.   MRN: [id number]  HPI Here for follow up of possible thrush.  He was first seen due to thrush like discharge on his tongue.  He had tried oral fluconazole with no improvement.  I got him Crezemba and no improvement.  I then cultured it and grew two different fungi and he received amphotericin lipid formulation for 14 doses.  He had no improvement of his oral discharge.   He also had issues with hypokalemia and continues to take supplementation of KCl and Mg.     Review of Systems  Constitutional: Negative for fever.  Gastrointestinal: Negative for diarrhea.       Objective:   Physical Exam HENT:     Mouth/Throat:     Comments: + thrush Eyes:     General: No scleral icterus. Neurological:     Mental Status: He is alert.  Psychiatric:        Mood and Affect: Mood normal.   Sh: history of tobacco use        Assessment & Plan:

## 2019-08-22 ENCOUNTER — Telehealth: Payer: Self-pay

## 2019-08-22 LAB — BASIC METABOLIC PANEL
BUN: 20 mg/dL (ref 7–25)
CO2: 28 mmol/L (ref 20–32)
Calcium: 9.9 mg/dL (ref 8.6–10.3)
Chloride: 95 mmol/L — ABNORMAL LOW (ref 98–110)
Creat: 0.89 mg/dL (ref 0.70–1.33)
Glucose, Bld: 102 mg/dL — ABNORMAL HIGH (ref 65–99)
Potassium: 3.7 mmol/L (ref 3.5–5.3)
Sodium: 137 mmol/L (ref 135–146)

## 2019-08-22 LAB — MAGNESIUM: Magnesium: 1.9 mg/dL (ref 1.5–2.5)

## 2019-08-22 NOTE — Telephone Encounter (Signed)
Left patient a VM regarding results and to STOP supplements. Call office with any questions.  Eugenia Mcalpine

## 2019-08-22 NOTE — Telephone Encounter (Signed)
-----   Message from Thayer Headings, MD sent at 08/22/2019  8:23 AM EST ----- Please let him know his K and Mag levels are normal again so he can stop the supplementation  thanks

## 2019-08-23 ENCOUNTER — Telehealth: Payer: Self-pay | Admitting: *Deleted

## 2019-08-23 NOTE — Telephone Encounter (Signed)
RN left message asking when patient's PICC had been pulled. If he still has the PICC, will need to arrange an appointment for pull per Dr Linus Salmons. Landis Gandy, RN

## 2019-08-28 ENCOUNTER — Telehealth: Payer: Self-pay | Admitting: *Deleted

## 2019-08-28 ENCOUNTER — Other Ambulatory Visit: Payer: Self-pay | Admitting: Hematology

## 2019-08-28 DIAGNOSIS — B370 Candidal stomatitis: Secondary | ICD-10-CM

## 2019-08-28 NOTE — Telephone Encounter (Signed)
Patient still with PICC in place. Per [name], they would like to keep this in place until after ENT evaluation on 2/19 in case ENT would like to prescribe IV therapy.  [name] is performing flushing/dressing change at the house. Per Dr Linus Salmons, ok to keep until ENT evaluation on the 19th, but should have it removed on the 20th if ENT is not going to be utilizing. [name] should maintain the PICC with daily flushes in the mean time. Landis Gandy, RN

## 2019-08-29 ENCOUNTER — Other Ambulatory Visit: Payer: Self-pay

## 2019-08-29 ENCOUNTER — Encounter: Payer: Self-pay | Admitting: Physical Therapy

## 2019-08-29 ENCOUNTER — Ambulatory Visit: Payer: Medicaid Other | Attending: Radiation Oncology | Admitting: Physical Therapy

## 2019-08-29 DIAGNOSIS — I890 Lymphedema, not elsewhere classified: Secondary | ICD-10-CM | POA: Insufficient documentation

## 2019-08-29 DIAGNOSIS — L599 Disorder of the skin and subcutaneous tissue related to radiation, unspecified: Secondary | ICD-10-CM

## 2019-08-29 DIAGNOSIS — R293 Abnormal posture: Secondary | ICD-10-CM | POA: Diagnosis not present

## 2019-08-29 NOTE — Therapy (Signed)
[place], [place], [address] Phone: [phone]   Fax:  [phone]  Physical Therapy Treatment  Patient Details  Name: [name] [name] MRN: [id number] Date of Birth: 12-19-1960 Referring Provider (PT): Reita May Date: 08/29/2019  PT End of Session - 08/29/19 1702    Visit Number  13    Number of Visits  16    Date for PT Re-Evaluation  09/26/19    Authorization Type  pending medicaid approval- 3 visits per 30 day auth    Authorization - Visit Number  1    Authorization - Number of Visits  3    PT Start Time  1502    PT Stop Time  1550    PT Time Calculation (min)  48 min    Activity Tolerance  Patient tolerated treatment well    Behavior During Therapy  [facility] for tasks assessed/performed       Past Medical History:  Diagnosis Date  . 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 Benign essential HTN 01/04/2012  . Cancer ([place])    tonsilar  . Chronic anticoagulation 01/02/2013  . Dermatofibroma of forearm 01/02/2013   Left side  . Hyperlipidemia, mixed 01/04/2012  . 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 Primary hypercoagulable state ([place]) 10/01/2014  . Sinus bradycardia, chronic 01/04/2012  . Superficial thrombosis of lower extremity 05/02/2012    Past Surgical History:  Procedure Laterality Date  . DIRECT LARYNGOSCOPY Left 10/19/2018   Procedure: DIRECT LARYNGOSCOPY;  Surgeon: Leta Baptist, MD;  Location: [place];  Service: ENT;  Laterality: Left;  . IR IMAGING GUIDED PORT INSERTION  11/04/2018  . IR REMOVAL TUN ACCESS W/ PORT W/O FL MOD SED  05/01/2019  . TONSILLECTOMY Left 10/19/2018   Procedure: BIOPSY OF LEFT TONSIL;  Surgeon: Leta Baptist, MD;  Location: [place];  Service: ENT;  Laterality:  Left;    There were no vitals filed for this visit.  Subjective Assessment - 08/29/19 1505    Subjective  I am still having trouble opening up my jaw. I have been unable to eat because I can not open my mouth. I have tried using a popsicle stick to try and increase the opening.    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  Yes    Pain Score  4     Pain Location  Throat    Pain Descriptors / Indicators  Sore;Stabbing    Pain Type  Acute pain    Pain Onset  More than a month ago    Pain Frequency  Constant    Aggravating Factors   jaw is stiff all the time now    Effect of Pain on Daily Activities  unable to eat         [facility] PT Assessment - 08/29/19 0001      ROM / Strength   AROM / PROM / Strength  AROM      AROM   AROM Assessment Site  Jaw    Cervical Flexion  60  Cervical Extension  33    Cervical - Right Side Bend  45    Cervical - Left Side Bend  45    Jaw-Incisal Opening   71mm    Jaw-Left Lateral Excurison  5 mm    Jaw-Right Lateral Excursion  2 mm        LYMPHEDEMA/ONCOLOGY QUESTIONNAIRE - 08/29/19 1513      Head and Neck   Right Corner of mouth to where ear lobe meets face  11 cm    Left Corner of mouth to where ear lobe meets face  12 cm    4 cm superior to sternal notch around neck  42 cm    6 cm superior to sternal notch around neck  42.5 cm    8 cm superior to sternal notch around neck  44.2 cm                [facility] Adult PT Treatment/Exercise - 08/29/19 0001      Manual Therapy   Soft tissue mobilization  gently to left jaw in area of pain and tightness    Manual Lymphatic Drainage (MLD)  in supine head of bed elevated: 5 diaphragmatic breaths, short neck, bilateral axillary nodes, bilateral pectoral nodes, posterior, lateral and  anterior neck moving fluid towards pathways              PT Education - 08/29/19 1701    Education Details  continue neck ROM exercises, jaw ROM exercises, follow up with Dr about decreased jaw opening and pain    Person(s) Educated  Patient;Child(ren)    Methods  Explanation;Handout    Comprehension  Verbalized understanding          PT Long Term Goals - 08/29/19 1509      PT LONG TERM GOAL #1   Title  Pt will be able to independently manage lymphedema through self MLD and compression garments    Baseline  04/25/19- pt to get measured for compression garments next week, he is still learning self MLD; pt doing self MLD a few times/day and wearing garment(s) daily-07/03/19    Time  4    Period  Weeks    Status  Achieved      PT LONG TERM GOAL #2   Title  Pt will receive appropriate compression garments for long term management of lymphedema    Baseline  04/25/19- pt will be measured for compression garments next week; wearing this daily now for 2-3 hours at a time-07/03/19    Time  4    Period  Weeks    Status  Achieved      PT LONG TERM GOAL #3   Title  Pt will reduce swelling at 8 cm superior to sternal notch by 2 cm to decrease risk of swelling.    Baseline  50.2 cm, 04/25/19- 50.4 but rest of neck demonstrates around 1.5 cm reduction, 06/01/19: 49.1; 42.5 cm - 07/03/19    Time  4    Period  Weeks    Status  Achieved      PT LONG TERM GOAL #4   Title  Pt to demonstrate 30 mm of jaw opening to allow pt to eat food.    Baseline  26mm- pt unable to eat    Time  4    Period  Weeks    Status  New    Target Date  09/26/19      PT LONG TERM GOAL #5   Title  Pt  to demonstrate 8 cm of bilateral lateral jaw excursion to allow pt to return to PLOF    Baseline  R 67mm, L 78mm    Time  4    Period  Weeks    Status  New    Target Date  09/26/19      Additional Long Term Goals   Additional Long Term Goals  Yes      PT LONG TERM GOAL #6   Title  Pt will repot a 50%  decrease in jaw pain to allow pt to return to prior level of function and be able to eat.    Time  4    Period  Weeks    Status  New    Target Date  09/26/19            Plan - 08/29/19 1703    Clinical Impression Statement  Pt presents back to PT for re eval since it has been several months since his last appointment. Since his last appointment he has had increased jaw pain and is unable to eat due to limited jaw motion. He is only able to open his jaw 28mm and normal is 30-24mm. He also has limited lateral excursion. His jaw appears swollen on left side and is very tender to the touch. Hard areas are palpable. Pt reports he has not seen his oncologist since early November of 2020. Educated pt to follow up with oncologist about decreased jaw ROM, increased pain and tenderness and consistent thrush despite taking numerous antibiotics. Pt's daughter accompanied him today and was going to call the doctor tomororw. Pt would benefit from continued skilled PT services to decrease jaw pain, improve jaw mobility and decrease swelling to allow pt to eat- his daughter reports he has lost 40 lbs in the past few months.    Personal Factors and Comorbidities  Comorbidity 1    Comorbidities  history of multiple DVT and PE    Stability/Clinical Decision Making  Evolving/Moderate complexity    Clinical Decision Making  Moderate    Rehab Potential  Good    PT Frequency  Other (comment)   3 visits per Josem Kaufmann   PT Duration  4 weeks    PT Treatment/Interventions  ADLs/Self Care Home Management;Therapeutic exercise;Manual lymph drainage;Manual techniques;Compression bandaging;Taping;Therapeutic activities;Patient/family education;Passive range of motion;Joint Manipulations    PT Next Visit Plan  jaw massage, MLD to head and neck, trismus exercises- see what dr said    PT Home Exercise Plan  wear head and neck garment, self MLD, neck and jaw ROM exercises    Consulted and Agree with Plan of Care  Patient        Patient will benefit from skilled therapeutic intervention in order to improve the following deficits and impairments:  Postural dysfunction, Decreased strength, Increased edema, Decreased knowledge of precautions, Pain, Increased fascial restricitons, Decreased range of motion, Increased muscle spasms  Visit Diagnosis: Lymphedema, not elsewhere classified  Disorder of the skin and subcutaneous tissue related to radiation, unspecified  Abnormal posture     Problem List Patient Active Problem List   Diagnosis Date Noted  . Hypokalemia 08/21/2019  . Warfarin anticoagulation 02/20/2019  . Hypercholesteremia 02/07/2019  . Recurrent deep vein thrombosis (DVT) ([place]) 02/06/2019  . Thoracic ascending aortic aneurysm ([place]) 02/06/2019  . Port-A-Cath in place 01/18/2019  . Mucositis due to chemotherapy 12/07/2018  . Hypomagnesemia 12/07/2018  . Thrush 12/07/2018  . Malignant neoplasm of tonsillar fossa ([place]) 10/17/2018  . Arterial embolus and thrombosis  of lower extremity ([place]) 05/25/2017  . 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 DVT, lower extremity, proximal ([place]) 05/24/2013  . Chronic anticoagulation 01/02/2013  . Superficial thrombosis of lower extremity 05/02/2012  . Benign essential HTN 01/04/2012  . Hyperlipidemia, mixed 01/04/2012    Allyson Sabal [facility] 08/29/2019, 5:14 PM  [place], [place], [address] Phone: [phone]   Fax:  [phone]  Name: [name] [name] MRN: [id number] Date of Birth: 1961/02/14  Manus Gunning, PT 08/29/19 5:14 PM

## 2019-08-30 ENCOUNTER — Telehealth: Payer: Self-pay | Admitting: *Deleted

## 2019-08-30 NOTE — Telephone Encounter (Signed)
Oncology Nurse Navigator Documentation  In follow-up to conversation with [name] [name], scheduler for Dr. Claud Kelp, faxed report for 06/26/2019 CT Soft Tissue Neck to Dr. Alfonso Ellis attention in advance of [name] [name] appt this Friday.  Noted patient's concern for increasing L sinus drainage, need for bx of L tonsillar mass per previous visit.  Notification of successful fax transmission received.  [name] Orem, RN, BSN Head & Neck Oncology Nurse [place] at [place] [phone]

## 2019-08-30 NOTE — Telephone Encounter (Signed)
Oncology Nurse Navigator Documentation  Spoke with [name] [name], informed her appt with Dr. Claud Kelp [facility] Otolaryngology has been moved up to this Friday 9:00.  She voiced understanding.  Gayleen Orem, RN, BSN Head & Neck Oncology Nurse [place] at [place] [phone]

## 2019-08-30 NOTE — Telephone Encounter (Signed)
Oncology Nurse Navigator Documentation  In follow-up to call from [place], [facility], called [name] [name] SO/caregiver [name] [name].  [name] [name] provided the following updates re [name] [name]:  Oral intake is diminishing d/t trismus and persistence drainage from L sinus (r/t cyst identified on 06/26/19 CT Neck).  Current weight 209 lbs, down from 260 lb baseline.  Thrush persists despite course of Isavuconazonium Sulfate Rxed by [name] [name] November last year.  Throat pain continues.  She indicated frustration with multiple providers, indicated wish for centralized care with one physician for symptom mgt, expressed interest in Palliative Care referral.  I indicated I would coordinate appt with [name] [name] for tomorrow at [facility] to address immediate needs, contact [facility] Otolaryngology to see if [name] [name] can see him before currently scheduled 2/19 appt.  [name] Orem, RN, BSN Head & Neck Oncology Nurse [place] at [place] [phone]

## 2019-08-31 ENCOUNTER — Inpatient Hospital Stay: Payer: Medicaid Other

## 2019-08-31 ENCOUNTER — Inpatient Hospital Stay: Payer: Medicaid Other | Attending: Hematology | Admitting: Hematology

## 2019-08-31 ENCOUNTER — Other Ambulatory Visit: Payer: Self-pay

## 2019-08-31 ENCOUNTER — Encounter: Payer: Self-pay | Admitting: Hematology

## 2019-08-31 VITALS — BP 109/79 | HR 66 | Temp 97.5°F | Resp 18 | Ht 72.0 in | Wt 203.0 lb

## 2019-08-31 DIAGNOSIS — F17210 Nicotine dependence, cigarettes, uncomplicated: Secondary | ICD-10-CM | POA: Diagnosis not present

## 2019-08-31 DIAGNOSIS — Z452 Encounter for adjustment and management of vascular access device: Secondary | ICD-10-CM

## 2019-08-31 DIAGNOSIS — B370 Candidal stomatitis: Secondary | ICD-10-CM | POA: Diagnosis not present

## 2019-08-31 DIAGNOSIS — I82402 Acute embolism and thrombosis of unspecified deep veins of left lower extremity: Secondary | ICD-10-CM | POA: Diagnosis not present

## 2019-08-31 DIAGNOSIS — R220 Localized swelling, mass and lump, head: Secondary | ICD-10-CM

## 2019-08-31 DIAGNOSIS — Z7901 Long term (current) use of anticoagulants: Secondary | ICD-10-CM | POA: Insufficient documentation

## 2019-08-31 DIAGNOSIS — M542 Cervicalgia: Secondary | ICD-10-CM | POA: Insufficient documentation

## 2019-08-31 DIAGNOSIS — Z79899 Other long term (current) drug therapy: Secondary | ICD-10-CM | POA: Insufficient documentation

## 2019-08-31 DIAGNOSIS — I825Y1 Chronic embolism and thrombosis of unspecified deep veins of right proximal lower extremity: Secondary | ICD-10-CM | POA: Diagnosis not present

## 2019-08-31 DIAGNOSIS — G893 Neoplasm related pain (acute) (chronic): Secondary | ICD-10-CM

## 2019-08-31 DIAGNOSIS — C090 Malignant neoplasm of tonsillar fossa: Secondary | ICD-10-CM | POA: Insufficient documentation

## 2019-08-31 LAB — CBC WITH DIFFERENTIAL (CANCER CENTER ONLY)
Abs Immature Granulocytes: 0.03 10*3/uL (ref 0.00–0.07)
Basophils Absolute: 0 10*3/uL (ref 0.0–0.1)
Basophils Relative: 0 %
Eosinophils Absolute: 0.1 10*3/uL (ref 0.0–0.5)
Eosinophils Relative: 1 %
HCT: 37.8 % — ABNORMAL LOW (ref 39.0–52.0)
Hemoglobin: 12.7 g/dL — ABNORMAL LOW (ref 13.0–17.0)
Immature Granulocytes: 0 %
Lymphocytes Relative: 5 %
Lymphs Abs: 0.5 10*3/uL — ABNORMAL LOW (ref 0.7–4.0)
MCH: 29.7 pg (ref 26.0–34.0)
MCHC: 33.6 g/dL (ref 30.0–36.0)
MCV: 88.3 fL (ref 80.0–100.0)
Monocytes Absolute: 1.1 10*3/uL — ABNORMAL HIGH (ref 0.1–1.0)
Monocytes Relative: 12 %
Neutro Abs: 7.4 10*3/uL (ref 1.7–7.7)
Neutrophils Relative %: 82 %
Platelet Count: 306 10*3/uL (ref 150–400)
RBC: 4.28 MIL/uL (ref 4.22–5.81)
RDW: 13.2 % (ref 11.5–15.5)
WBC Count: 9.1 10*3/uL (ref 4.0–10.5)
nRBC: 0 % (ref 0.0–0.2)

## 2019-08-31 LAB — CMP (CANCER CENTER ONLY)
ALT: 10 U/L (ref 0–44)
AST: 9 U/L — ABNORMAL LOW (ref 15–41)
Albumin: 3.9 g/dL (ref 3.5–5.0)
Alkaline Phosphatase: 83 U/L (ref 38–126)
Anion gap: 10 (ref 5–15)
BUN: 25 mg/dL — ABNORMAL HIGH (ref 6–20)
CO2: 30 mmol/L (ref 22–32)
Calcium: 10.3 mg/dL (ref 8.9–10.3)
Chloride: 96 mmol/L — ABNORMAL LOW (ref 98–111)
Creatinine: 1.06 mg/dL (ref 0.61–1.24)
GFR, Est AFR Am: >60 mL/min (ref >60)
GFR, Estimated: >60 mL/min (ref >60)
Glucose, Bld: 97 mg/dL (ref 70–99)
Potassium: 3.8 mmol/L (ref 3.5–5.1)
Sodium: 136 mmol/L (ref 135–145)
Total Bilirubin: 0.5 mg/dL (ref 0.3–1.2)
Total Protein: 6.9 g/dL (ref 6.5–8.1)

## 2019-08-31 MED ORDER — SODIUM CHLORIDE 0.9% FLUSH
10.0000 mL | Freq: Once | INTRAVENOUS | Status: AC
  Administered 2019-08-31: 10 mL via INTRAVENOUS
  Filled 2019-08-31: qty 10

## 2019-08-31 MED ORDER — HEPARIN SOD (PORK) LOCK FLUSH 100 UNIT/ML IV SOLN
500.0000 [IU] | Freq: Once | INTRAVENOUS | Status: AC
  Administered 2019-08-31: 500 [IU] via INTRAVENOUS
  Filled 2019-08-31: qty 5

## 2019-08-31 NOTE — Patient Instructions (Signed)

## 2019-08-31 NOTE — Progress Notes (Signed)
[place] OFFICE PROGRESS NOTE  Patient Care Team: [name], MD as PCP - General (Internal Medicine) Annia Belt, MD as Consulting Physician (Oncology) Leta Baptist, MD as Consulting Physician (Otolaryngology) Eppie Gibson, MD as Attending Physician (Radiation Oncology) Tish Men, MD as Consulting Physician (Hematology) Leota Sauers, RN as Oncology Nurse Navigator  HEME/ONC OVERVIEW: 1. 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 TBenjamine Mola invasive SCCa, p16+; not a candidate for TORS -Late 10/2018 - 12/2018: definitive chemoradiation with weekly cisplatin   03/2019: end-of-treatment PET showed decrease in FDG avidity in the left tonsil but some residual soft tissue fullness, resolution of left LN disease    2. 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 -12/2018: acute DVT involving R peroneal vein and L gastrocnemius vein; new arterial thrombosis involving R common femoral artery and popliteal arteries, due to artery aneurysm -Late 12/2018: progression of acute DVT in the LLE from gastrocnemius vein to the femoral, popliteal, posterior tibial and peroneal veins despite being on Eliquis  -Currently on warfarin   TREATMENT REGIMEN:  11/08/2018 - 12/29/2018: definitive chemoradiation with weekly cisplatin x 6  Anticoagulation:  Early 2000 - 12/2018: warfarin, non-compliant   12/2018: Eliquis 58mBID x ~4 weeks with clot progression  Late 12/2018 - present: warfarin  PERTINENT NON-HEM/ONC PROBLEMS: 1. Tobacco and EtOH abuse 2. Paroxysmal A-fib 3. PVD    ASSESSMENT & PLAN:   Stage I (cT2N1M0) squamous cell carcinoma  of left tonsil, p16+  -S/p definitive chemoRT, completed on 12/29/2018 -Follow-up CT neck in 06/2019 showed more fullness in the left tonsillar region, concerning for recurrent malignancy.  He was referred to Dr. TeBenjamine Molaor biopsy, but the biopsy was delayed due to trismus.  [facility] but no repeat biopsy has been done yet. -Unfortunately, he has developed worsening trismus over the past two months, and is only able to open his mouth ~1cm -On exam, patient has increased fullness/firmness in the left angle of the jaw, concerning for local disease recurrence -I have ordered CT maxillofacial, neck and chest ASAP to assess for any recurrent disease -[facility] which I strongly encouraged him to keep to determine the best way to pursue biopsy  Left neck pain -Possibly due to recurrent malignancy vs radiation-induced fibrosis -Patient currently takes ibuprofen alternating with Norco (10/325 x 2 tabs each time) -I recommended the patient to try IR liquid morphine PRN instead of Norco due to the concern about excess Tylenol intake.  If IR liquid morphine does not control the pain adequately, then we can try PRN liquid oxycodone (preferred over tablets due to trismus).  Recurrent LLE DVT -Currently on warfarin -Continue follow-up with anticoagulation clinic  -Goal of anticoagulation is lifelong   Persistent oral candidiasis -Despite aggressive antifungal therapy, including amphotericin, there was no significant improvement in oral candidiasis -Infectious disease felt that the oral candidiasis was noninfectious, and therefore does not warrant any further treatment at this time -Continue follow-up with ID as needed  Orders Placed This Encounter  Procedures  . CT SOFT TISSUE NECK W CONTRAST  Standing Status:   Future    Standing Expiration Date:   08/30/2020    Order Specific Question:   **  REASON FOR EXAM (FREE TEXT)    Answer:   Suspected left tonsil cancer recurrence    Order Specific Question:   If indicated for the ordered procedure, I authorize the administration of contrast media per Radiology protocol    Answer:   Yes    Order Specific Question:   Preferred imaging location?    Answer:   [facility] Specific Question:   Radiology Contrast Protocol - do NOT remove file path    Answer:   \\[email or web address].pdf  . CT CHEST W CONTRAST    Standing Status:   Future    Standing Expiration Date:   08/30/2020    Order Specific Question:   ** REASON FOR EXAM (FREE TEXT)    Answer:   Suspected left tonsil cancer recurrence    Order Specific Question:   If indicated for the ordered procedure, I authorize the administration of contrast media per Radiology protocol    Answer:   Yes    Order Specific Question:   Preferred imaging location?    Answer:   [facility] Specific Question:   Radiology Contrast Protocol - do NOT remove file path    Answer:   \\[email or web address]  . CT MAXILLOFACIAL W CONTRAST    Standing Status:   Future    Standing Expiration Date:   11/27/2020    Order Specific Question:   If indicated for the ordered procedure, I authorize the administration of contrast media per Radiology protocol    Answer:   Yes    Order Specific Question:   Preferred imaging location?    Answer:   [facility] Specific Question:   Radiology Contrast Protocol - do NOT remove file path    Answer:   \\[email or web address]   The total time spent in the encounter was 45 minutes, including face-to-face time with the patient, review of various tests results, order additional studies/medications, documentation, and coordination of care plan.   All questions were answered. The patient knows to call the clinic with any problems, questions or concerns. No barriers to  learning was detected.  Return on 09/08/2019 for CT results and to determine the next steps.  Tish Men, MD 2/11/20219:08 PM  CHIEF COMPLAINT: "I have a hard time opening my jaw"  INTERVAL HISTORY: Mr. [name] [name] returns to clinic for follow-up of left tonsil cancer s/p definitive chemoradiation.  Patient was accompanied by his daughter.  According to the patient's daughter, he has developed worsening trismus over the past several weeks, to the point that he can only open his mouth by ~1cm.  As a result, he can only drink nutritional supplements, but has not been able to eat solid food.  He continues to smoke, but states that he only smokes 4-5 cigarettes per day.  He has been taking ibuprofen alternating with Norco 10/325 x 2 tabs (each time) for the left jaw/neck pain.  He met with ENT at [facility] a month ago, but no biopsy was done at that time due to trismus.  He has not had any repeat imaging studies.   REVIEW OF SYSTEMS:   Constitutional: ( - ) fevers, ( - )  chills , ( - ) night sweats Eyes: ( - ) blurriness of vision, ( - ) double vision, ( - ) watery  eyes Ears, nose, mouth, throat, and face: ( - ) mucositis, ( + ) sore throat Respiratory: ( - ) cough, ( - ) dyspnea, ( - ) wheezes Cardiovascular: ( - ) palpitation, ( - ) chest discomfort, ( - ) lower extremity swelling Gastrointestinal:  ( - ) nausea, ( - ) heartburn, ( - ) change in bowel habits Skin: ( - ) abnormal skin rashes Lymphatics: ( - ) new lymphadenopathy, ( - ) easy bruising Neurological: ( - ) numbness, ( - ) tingling, ( - ) new weaknesses Behavioral/Psych: ( - ) mood change, ( - ) new changes  All other systems were reviewed with the patient and are negative.  SUMMARY OF ONCOLOGIC HISTORY: Oncology History  Malignant neoplasm of tonsillar fossa ([place])  10/10/2018 Imaging   CT neck w/ contrast: FINDINGS: Pharynx and larynx: There is asymmetric prominence of the left tonsillar region. No other mucosal or submucosal  lesion is seen.  Salivary glands: Parotid and submandibular glands are normal.  Thyroid: Normal  Lymph nodes: Definite adenopathy on the left. 2 level nodes which shows some internal necrosis. The larger measures 2.2 cm in diameter and the smaller measures 1.4 cm in diameter. Suspicious level 4 node image 83 just anterior to the jugular vein measuring 9 mm short axis diameter. No worrisome nodes on the right.   10/10/2018 Imaging   CT chest:  IMPRESSION: 1. Mild right hilar adenopathy, equivocal for metastasis. No additional potential evidence of metastatic disease in the chest. PET-CT could be considered for further staging evaluation, as clinically warranted. 2. 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 Ascending thoracic aortic aneurysm. Recommend semi-annual imaging followup by CTA or MRA and referral to cardiothoracic surgery if not already obtained. This recommendation follows 2010 ACCF/AHA/AATS/ACR/ASA/SCA/SCAI/SIR/STS/SVM Guidelines for the Diagnosis and Management of Patients With Thoracic Aortic Disease. Circulation. 2010; 121: S315-X458. Aortic aneurysm NOS (ICD10-I71.9).   10/17/2018 Initial Diagnosis   Tonsil cancer ([place])   10/18/2018 Imaging   PET: IMPRESSION: 1. 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 2. Local hypermetabolic nodal metastasis to the LEFT level II nodal station. 3. No contralateral nodal metastasis. 4. No evidence distant metastatic disease. 5. Two small pulmonary nodules in the LEFT lower lobe are favored benign. Recommend attention on follow-up. 6. Several small common iliac lymph nodes with low metabolic activity. Favor reactive adenopathy.   10/21/2018 Cancer Staging   Staging form: Pharynx - HPV-Mediated Oropharynx, AJCC 8th Edition - Clinical: Stage I (cT2, cN1, cM0, p16+) - Signed by Eppie Gibson, MD on 10/21/2018   11/10/2018 -  Chemotherapy   The patient had palonosetron (ALOXI) injection  0.25 mg, 0.25 mg, Intravenous,  Once, 6 of 7 cycles Administration: 0.25 mg (11/10/2018), 0.25 mg (11/17/2018), 0.25 mg (12/22/2018), 0.25 mg (11/24/2018), 0.25 mg (12/15/2018), 0.25 mg (12/29/2018) CISplatin (PLATINOL) 100 mg in sodium chloride 0.9 % 500 mL chemo infusion, 40 mg/m2 = 100 mg, Intravenous,  Once, 6 of 7 cycles Dose modification: 30 mg/m2 (original dose 40 mg/m2, Cycle 5, Reason: Dose not tolerated) Administration: 100 mg (11/10/2018), 100 mg (11/17/2018), 75 mg (12/22/2018), 100 mg (11/24/2018), 75 mg (12/15/2018), 75 mg (12/29/2018) fosaprepitant (EMEND) 150 mg, dexamethasone (DECADRON) 12 mg in sodium chloride 0.9 % 145 mL IVPB, , Intravenous,  Once, 6 of 7 cycles Administration:  (11/10/2018),  (11/17/2018),  (12/22/2018),  (11/24/2018),  (12/15/2018),  (12/29/2018)  for chemotherapy treatment.    04/10/2019 Imaging   PET (end-of-treatment) IMPRESSION: 1. Interval decrease in hypermetabolism associated with the left tonsillar region.  CT imaging does demonstrate some residual soft tissue fullness in this region. 2. Interval resolution of left-sided level II hypermetabolic nodal metastases. 3. No evidence for hypermetabolic metastatic disease in the chest, abdomen, or pelvis. 4. 2 tiny left lower lobe pulmonary nodules were identified on the previous study. One of these nodules is stable and the other has resolved. Continued attention on follow-up suggested. 5. The small bilateral common iliac nodes seen previously are unchanged, likely reactive 6.  Aortic Atherosclerois (ICD10-170.0) 7.  Emphysema. (RDE08-X44.9)     I have reviewed the past medical history, past surgical history, social history and family history with the patient and they are unchanged from previous note.  ALLERGIES:  has No Known Allergies.  MEDICATIONS:  Current Outpatient Medications  Medication Sig Dispense Refill  . amLODipine (NORVASC) 10 MG tablet Take 1 tablet (10 mg total) by mouth daily. 90 tablet 2  .  cyclobenzaprine (FLEXERIL) 10 MG tablet TAKE 1 TABLET BY MOUTH THREE TIMES A DAY AS NEEDED FOR MUSCLE SPASMS 30 tablet 0  . HYDROcodone-acetaminophen (NORCO) 10-325 MG tablet Take 1 tablet by mouth every 8 (eight) hours as needed. 40 tablet 0  . ibuprofen (ADVIL) 200 MG tablet Take 600 mg by mouth every 6 (six) hours as needed for pain or fever.     . warfarin (COUMADIN) 10 MG tablet Take 1 tablet of 10 mg warfarin daily except on Mondays, Wednesdays and Fridays, take only 1/2 tablet (5 mg). 24 tablet 2  . atorvastatin (LIPITOR) 20 MG tablet Take 1 tablet (20 mg total) by mouth daily. (Patient not taking: Reported on 08/31/2019) 30 tablet 11  . Isavuconazonium Sulfate 186 MG CAPS Take 1 capsule (186 mg total) by mouth as directed. Load with 2 capsules every 8 hours x 6 doses, then 2 capsules daily the following day (Patient not taking: Reported on 07/24/2019) 36 capsule 0  . sodium fluoride (PREVIDENT 5000 PLUS) 1.1 % CREA dental cream Apply to tooth brush. Brush teeth for 2 minutes. Spit out excess. DO NOT rinse afterwards. Repeat nightly. (Patient not taking: Reported on 08/14/2019) 1 Tube prn   No current facility-administered medications for this visit.    PHYSICAL EXAMINATION: ECOG PERFORMANCE STATUS: 1 - Symptomatic but completely ambulatory  Today's Vitals   08/31/19 1444  BP: 109/79  Pulse: 66  Resp: 18  Temp: (!) 97.5 F (36.4 C)  TempSrc: Temporal  SpO2: 98%  Weight: 203 lb (92.1 kg)  Height: 6' (1.829 m)  PainSc: 3    Body mass index is 27.53 kg/m.  Filed Weights   08/31/19 1444  Weight: 203 lb (92.1 kg)    GENERAL: alert, uncomfortable due to left jaw pain  SKIN: skin color, texture, turgor are normal, no rashes or significant lesions EYES: conjunctiva are pink and non-injected, sclera clear OROPHARYNX: severe trismus (~1cm opening), limited oral candidiasis visualized NECK: mild tenderness with palpation  LYMPH:  Firm, swelling with a firm mass protruding form the left  angle of the jaw, suspicious for recurrent disease  LUNGS: clear to auscultation with normal breathing effort HEART: regular rate & rhythm and no murmurs and no lower extremity edema ABDOMEN: soft, non-tender, non-distended, normal bowel sounds Musculoskeletal: no cyanosis of digits and no clubbing  PSYCH: alert & oriented x 3  LABORATORY DATA:  I have reviewed the data as listed    Component Value Date/Time   NA 136 08/31/2019 1421   NA 142 10/03/2018 1450   NA 141 05/21/2014 1226   K 3.8  08/31/2019 1421   K 4.3 05/21/2014 1226   CL 96 (L) 08/31/2019 1421   CL 108 (H) 12/28/2012 1405   CO2 30 08/31/2019 1421   CO2 28 05/21/2014 1226   GLUCOSE 97 08/31/2019 1421   GLUCOSE 101 05/21/2014 1226   GLUCOSE 95 12/28/2012 1405   BUN 25 (H) 08/31/2019 1421   BUN 15 10/03/2018 1450   BUN 13.2 05/21/2014 1226   CREATININE 1.06 08/31/2019 1421   CREATININE 0.89 08/21/2019 1523   CREATININE 0.9 05/21/2014 1226   CALCIUM 10.3 08/31/2019 1421   CALCIUM 9.3 05/21/2014 1226   PROT 6.9 08/31/2019 1421   PROT 7.2 10/03/2018 1450   PROT 7.1 01/08/2014 1203   ALBUMIN 3.9 08/31/2019 1421   ALBUMIN 4.3 10/03/2018 1450   ALBUMIN 3.7 01/08/2014 1203   AST 9 (L) 08/31/2019 1421   AST 50 (H) 01/08/2014 1203   ALT 10 08/31/2019 1421   ALT 83 (H) 01/08/2014 1203   ALKPHOS 83 08/31/2019 1421   ALKPHOS 87 01/08/2014 1203   BILITOT 0.5 08/31/2019 1421   BILITOT 0.67 01/08/2014 1203   GFRNONAA >60 08/31/2019 1421   GFRNONAA >60 10/15/2011 0224   GFRAA >60 08/31/2019 1421   GFRAA >60 10/15/2011 0224    No results found for: SPEP, UPEP  Lab Results  Component Value Date   WBC 9.1 08/31/2019   NEUTROABS 7.4 08/31/2019   HGB 12.7 (L) 08/31/2019   HCT 37.8 (L) 08/31/2019   MCV 88.3 08/31/2019   PLT 306 08/31/2019      Chemistry      Component Value Date/Time   NA 136 08/31/2019 1421   NA 142 10/03/2018 1450   NA 141 05/21/2014 1226   K 3.8 08/31/2019 1421   K 4.3 05/21/2014 1226    CL 96 (L) 08/31/2019 1421   CL 108 (H) 12/28/2012 1405   CO2 30 08/31/2019 1421   CO2 28 05/21/2014 1226   BUN 25 (H) 08/31/2019 1421   BUN 15 10/03/2018 1450   BUN 13.2 05/21/2014 1226   CREATININE 1.06 08/31/2019 1421   CREATININE 0.89 08/21/2019 1523   CREATININE 0.9 05/21/2014 1226      Component Value Date/Time   CALCIUM 10.3 08/31/2019 1421   CALCIUM 9.3 05/21/2014 1226   ALKPHOS 83 08/31/2019 1421   ALKPHOS 87 01/08/2014 1203   AST 9 (L) 08/31/2019 1421   AST 50 (H) 01/08/2014 1203   ALT 10 08/31/2019 1421   ALT 83 (H) 01/08/2014 1203   BILITOT 0.5 08/31/2019 1421   BILITOT 0.67 01/08/2014 1203       RADIOGRAPHIC STUDIES: I have personally reviewed the radiological images as listed below and agreed with the findings in the report. No results found.

## 2019-09-01 DIAGNOSIS — Z86718 Personal history of other venous thrombosis and embolism: Secondary | ICD-10-CM | POA: Diagnosis not present

## 2019-09-01 DIAGNOSIS — K1321 Leukoplakia of oral mucosa, including tongue: Secondary | ICD-10-CM | POA: Diagnosis not present

## 2019-09-01 DIAGNOSIS — Z87891 Personal history of nicotine dependence: Secondary | ICD-10-CM | POA: Diagnosis not present

## 2019-09-01 DIAGNOSIS — R070 Pain in throat: Secondary | ICD-10-CM | POA: Diagnosis not present

## 2019-09-01 DIAGNOSIS — C024 Malignant neoplasm of lingual tonsil: Secondary | ICD-10-CM | POA: Diagnosis not present

## 2019-09-01 DIAGNOSIS — Z86711 Personal history of pulmonary embolism: Secondary | ICD-10-CM | POA: Diagnosis not present

## 2019-09-01 DIAGNOSIS — Z7901 Long term (current) use of anticoagulants: Secondary | ICD-10-CM | POA: Diagnosis not present

## 2019-09-01 DIAGNOSIS — L858 Other specified epidermal thickening: Secondary | ICD-10-CM | POA: Diagnosis not present

## 2019-09-01 DIAGNOSIS — Z923 Personal history of irradiation: Secondary | ICD-10-CM | POA: Diagnosis not present

## 2019-09-01 DIAGNOSIS — Z85818 Personal history of malignant neoplasm of other sites of lip, oral cavity, and pharynx: Secondary | ICD-10-CM | POA: Diagnosis not present

## 2019-09-01 DIAGNOSIS — Z9221 Personal history of antineoplastic chemotherapy: Secondary | ICD-10-CM | POA: Diagnosis not present

## 2019-09-01 DIAGNOSIS — K1329 Other disturbances of oral epithelium, including tongue: Secondary | ICD-10-CM | POA: Diagnosis not present

## 2019-09-01 LAB — TSH: TSH: 3.584 u[IU]/mL (ref 0.320–4.118)

## 2019-09-04 ENCOUNTER — Ambulatory Visit: Payer: Medicaid Other

## 2019-09-04 ENCOUNTER — Ambulatory Visit (HOSPITAL_BASED_OUTPATIENT_CLINIC_OR_DEPARTMENT_OTHER): Admission: RE | Admit: 2019-09-04 | Payer: Medicaid Other | Source: Ambulatory Visit

## 2019-09-07 ENCOUNTER — Ambulatory Visit: Payer: Medicaid Other | Admitting: Physical Therapy

## 2019-09-08 ENCOUNTER — Inpatient Hospital Stay: Payer: Medicaid Other | Admitting: Hematology

## 2019-09-08 ENCOUNTER — Other Ambulatory Visit: Payer: Self-pay | Admitting: Radiation Oncology

## 2019-09-08 DIAGNOSIS — C090 Malignant neoplasm of tonsillar fossa: Secondary | ICD-10-CM

## 2019-09-08 MED ORDER — VITAMIN E 180 MG (400 UNIT) PO CAPS: ORAL_CAPSULE | ORAL | 5 refills | Status: DC

## 2019-09-08 MED ORDER — PENTOXIFYLLINE ER 400 MG PO TBCR: EXTENDED_RELEASE_TABLET | ORAL | 5 refills | Status: DC

## 2019-09-08 NOTE — Progress Notes (Signed)
I just spoke w/ Dr. Claud Kelp of [facility] about [name] [name] DOB [birth year]---  She reports that though his recent biopsies at [facility] were negative, they cannot definitively rule out recurrence.  More biopsy attempts would be complicated by his trismus. Radio necrosis is on the differential.  I just started pentoxifylline and Vit E in case of radionecrosis - Rx sent to [facility].  CT scan is still pending Medicaid approval - at least as of 36 hrs ago. Gayleen Orem, RN, our Head and Neck Oncology Navigator, will get these images to the [facility] team when available.   The [facility] team Nicolette Bang, [place]) believe that salvage surgery, if recurrence is proven, would be extremely morbid and most likely only palliative options would be prudent for residual disease in the throat.  Since he is losing a lot of weight and showing signs of pooled food in his throat (per Dr. Claud Kelp) I'm referring back to Nutrition and to SLP. Also referring back to social work. Of note, he is still smoking.  We have been unable to reach the patient and his daughter by phone. I just left a VM for his daughter [name] [name] and Dr. Claud Kelp tried calling Mr. Lemon this AM.  I hope to speak w/ one of them soon to update them on the above recommendations.  Orders Placed This Encounter  Procedures  . Ambulatory referral to Social Work  . Amb Referral to Nutrition and Diabetic E  . Referral to Neuro Rehab    -----------------------------------  Eppie Gibson, MD

## 2019-09-11 ENCOUNTER — Ambulatory Visit (HOSPITAL_BASED_OUTPATIENT_CLINIC_OR_DEPARTMENT_OTHER)
Admission: RE | Admit: 2019-09-11 | Discharge: 2019-09-11 | Disposition: A | Discharge status: Home / Self Care | Payer: Medicaid Other | Source: Ambulatory Visit | Attending: Hematology | Admitting: Hematology

## 2019-09-11 ENCOUNTER — Other Ambulatory Visit: Payer: Self-pay

## 2019-09-11 DIAGNOSIS — C099 Malignant neoplasm of tonsil, unspecified: Secondary | ICD-10-CM | POA: Diagnosis not present

## 2019-09-11 DIAGNOSIS — C090 Malignant neoplasm of tonsillar fossa: Secondary | ICD-10-CM

## 2019-09-11 DIAGNOSIS — J439 Emphysema, unspecified: Secondary | ICD-10-CM | POA: Diagnosis not present

## 2019-09-11 DIAGNOSIS — R918 Other nonspecific abnormal finding of lung field: Secondary | ICD-10-CM | POA: Diagnosis not present

## 2019-09-11 MED ORDER — IOHEXOL 300 MG/ML  SOLN
100.0000 mL | Freq: Once | INTRAMUSCULAR | Status: AC | PRN
  Administered 2019-09-11: 17:00:00 80 mL via INTRAVENOUS

## 2019-09-12 ENCOUNTER — Other Ambulatory Visit: Payer: Self-pay

## 2019-09-12 ENCOUNTER — Telehealth: Payer: Self-pay

## 2019-09-12 NOTE — Telephone Encounter (Signed)
-----   Message from Thayer Headings, MD sent at 09/11/2019  5:09 PM EST ----- Can you please make sure he has had his picc line removed ASAP as I recommended.  He is not using it and I want to avoid any complications from leaving it in.  thanks

## 2019-09-12 NOTE — Telephone Encounter (Signed)
Left message on voicemail: Please call , we would like to know if your PICC has been removed. Asked patient to leave answer on [facility]  voicemail.   Laverle Patter, RN

## 2019-09-13 ENCOUNTER — Telehealth: Payer: Self-pay | Admitting: General Practice

## 2019-09-13 ENCOUNTER — Ambulatory Visit (HOSPITAL_BASED_OUTPATIENT_CLINIC_OR_DEPARTMENT_OTHER)
Admission: RE | Admit: 2019-09-13 | Discharge: 2019-09-13 | Disposition: A | Discharge status: Home / Self Care | Payer: Medicaid Other | Source: Ambulatory Visit | Attending: Hematology | Admitting: Hematology

## 2019-09-13 ENCOUNTER — Ambulatory Visit: Payer: Medicaid Other

## 2019-09-13 ENCOUNTER — Inpatient Hospital Stay: Payer: Medicaid Other | Admitting: Nutrition

## 2019-09-13 ENCOUNTER — Other Ambulatory Visit: Payer: Self-pay

## 2019-09-13 ENCOUNTER — Telehealth: Payer: Self-pay | Admitting: Nutrition

## 2019-09-13 DIAGNOSIS — L599 Disorder of the skin and subcutaneous tissue related to radiation, unspecified: Secondary | ICD-10-CM

## 2019-09-13 DIAGNOSIS — I890 Lymphedema, not elsewhere classified: Secondary | ICD-10-CM | POA: Diagnosis not present

## 2019-09-13 DIAGNOSIS — R293 Abnormal posture: Secondary | ICD-10-CM | POA: Diagnosis not present

## 2019-09-13 DIAGNOSIS — R220 Localized swelling, mass and lump, head: Secondary | ICD-10-CM | POA: Insufficient documentation

## 2019-09-13 MED ORDER — IOHEXOL 300 MG/ML  SOLN
80.0000 mL | Freq: Once | INTRAMUSCULAR | Status: AC | PRN
  Administered 2019-09-13: 18:00:00 80 mL via INTRAVENOUS

## 2019-09-13 NOTE — Telephone Encounter (Signed)
I spoke with patient's family member on the telephone this morning as patient was not available.  She reports patient has actually increased his oral intake and is tolerating some soft foods.  He is able to eat scrambled eggs and soggy cereal.  He is drinking boost and milkshakes.  Reports patient able to open his mouth just a little bit more than he could before.  Educated on importance of increasing oral intake and trying to consume at least 2200 cal via soft food and shakes.  Speech evaluation has been ordered. They are aware of appointment with Dr. Nicolette Bang on March 2.  I encouraged return call questions developed after her appointment.

## 2019-09-13 NOTE — Therapy (Signed)
[place], [place], [address] Phone: [phone]   Fax:  [phone]  Physical Therapy Treatment  Patient Details  Name: [name] [name] MRN: [id number] Date of Birth: 12-Apr-1961 Referring Provider (PT): Reita May Date: 09/13/2019  PT End of Session - 09/13/19 1706    Visit Number  14    Number of Visits  16    Date for PT Re-Evaluation  09/26/19    Authorization Type  pending medicaid approval- 3 visits per 30 day auth    Authorization - Visit Number  2    Authorization - Number of Visits  3    PT Start Time  1509    PT Stop Time  1603    PT Time Calculation (min)  54 min    Activity Tolerance  Patient tolerated treatment well    Behavior During Therapy  [facility] for tasks assessed/performed       Past Medical History:  Diagnosis Date  . 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 Benign essential HTN 01/04/2012  . Cancer ([place])    tonsilar  . Chronic anticoagulation 01/02/2013  . Dermatofibroma of forearm 01/02/2013   Left side  . Hyperlipidemia, mixed 01/04/2012  . 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 Primary hypercoagulable state ([place]) 10/01/2014  . Sinus bradycardia, chronic 01/04/2012  . Superficial thrombosis of lower extremity 05/02/2012    Past Surgical History:  Procedure Laterality Date  . DIRECT LARYNGOSCOPY Left 10/19/2018   Procedure: DIRECT LARYNGOSCOPY;  Surgeon: Leta Baptist, MD;  Location: [place];  Service: ENT;  Laterality: Left;  . IR IMAGING GUIDED PORT INSERTION  11/04/2018  . IR REMOVAL TUN ACCESS W/ PORT W/O FL MOD SED  05/01/2019  . TONSILLECTOMY Left 10/19/2018   Procedure: BIOPSY OF LEFT TONSIL;  Surgeon: Leta Baptist, MD;  Location: [place];  Service: ENT;  Laterality:  Left;    There were no vitals filed for this visit.  Subjective Assessment - 09/13/19 1511    Subjective  Pt reports that he has been experiencing continued pain and difficulty opening his jaw. Going for maxillary facial scan today after treatment and then next week the ENT for another scan.    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  Yes    Pain Score  3     Pain Location  Jaw    Pain Orientation  Left    Pain Descriptors / Indicators  Constant    Pain Type  Acute pain    Pain Onset  More than a month ago    Pain Frequency  Constant    Aggravating Factors   jaw is stiff all the time now.                       [facility] Adult PT Treatment/Exercise - 09/13/19 0001      Neck Exercises: Supine   Other Supine Exercise  Mouth opening stretch 5x 5 seconds with PT assist with mandible depression, R/L mandibular deviation 5x 5 second stretch  w/PT assist    Other Supine Exercise  Biting stacked 4x tongue depressors 5x 5 seconds      Manual Therapy   Manual Therapy  Soft tissue mobilization;Manual Lymphatic Drainage (MLD)    Soft tissue mobilization  [place], SCM, ptergoids (internally), insertion of temporalis and upper trap; very minimal to no decrease in tightness/tenderness following very light STM.     Manual Lymphatic Drainage (MLD)  in supine head of bed elevated: 5 diaphragmatic breaths, short neck, bilateral axillary nodes, bilateral pectoral nodes, posterior, lateral and anterior neck moving fluid towards pathways; re-worked pathways then 5 diaphragmatic breathes             PT Education - 09/13/19 1705    Education Details  Pt was provided with jaw stretching exercises. He will continue to perform MLD and ROM exercises. Pt will not stretch into pain  discussed only very light stretching in order to prevent further inflammation.    Person(s) Educated  Patient;Spouse    Methods  Explanation;Demonstration;Verbal cues;Handout    Comprehension  Verbalized understanding;Returned demonstration          PT Long Term Goals - 08/29/19 1509      PT LONG TERM GOAL #1   Title  Pt will be able to independently manage lymphedema through self MLD and compression garments    Baseline  04/25/19- pt to get measured for compression garments next week, he is still learning self MLD; pt doing self MLD a few times/day and wearing garment(s) daily-07/03/19    Time  4    Period  Weeks    Status  Achieved      PT LONG TERM GOAL #2   Title  Pt will receive appropriate compression garments for long term management of lymphedema    Baseline  04/25/19- pt will be measured for compression garments next week; wearing this daily now for 2-3 hours at a time-07/03/19    Time  4    Period  Weeks    Status  Achieved      PT LONG TERM GOAL #3   Title  Pt will reduce swelling at 8 cm superior to sternal notch by 2 cm to decrease risk of swelling.    Baseline  50.2 cm, 04/25/19- 50.4 but rest of neck demonstrates around 1.5 cm reduction, 06/01/19: 49.1; 42.5 cm - 07/03/19    Time  4    Period  Weeks    Status  Achieved      PT LONG TERM GOAL #4   Title  Pt to demonstrate 30 mm of jaw opening to allow pt to eat food.    Baseline  17mm- pt unable to eat    Time  4    Period  Weeks    Status  New    Target Date  09/26/19      PT LONG TERM GOAL #5   Title  Pt to demonstrate 8 cm of bilateral lateral jaw excursion to allow pt to return to PLOF    Baseline  R 39mm, L 75mm    Time  4    Period  Weeks    Status  New    Target Date  09/26/19      Additional Long Term Goals   Additional Long Term Goals  Yes      PT LONG TERM GOAL #6   Title  Pt will repot a 50% decrease in jaw pain to allow pt to return to prior level of function and  be able to eat.    Time  4     Period  Weeks    Status  New    Target Date  09/26/19            Plan - 09/13/19 1512    Clinical Impression Statement  Pt presents with continued decrease in ability to depress the mandible. Pt is sensitive to touch over his lateral L buccal area and cervical area. Palpable tightness/tenderness noted at the insertion of the temporatlis, ptergoids medial/lateral, SCM and upper trapezius; very minmal decrease in tightness/tenderness following very light STM. MLD was performed with slight decrease in tightening of the skin in lateral facial area. Pt was able to perform light stretching for the mandible without a significant increase in pain and will coninue with these at home. Pt will benefit from continud POC unless otherwise specified by MD.    Personal Factors and Comorbidities  Comorbidity 1    Comorbidities  history of multiple DVT and PE    Rehab Potential  Good    PT Frequency  Other (comment)    PT Duration  4 weeks    PT Treatment/Interventions  ADLs/Self Care Home Management;Therapeutic exercise;Manual lymph drainage;Manual techniques;Compression bandaging;Taping;Therapeutic activities;Patient/family education;Passive range of motion;Joint Manipulations    PT Next Visit Plan  jaw massage, MLD to head and neck, try tongue to roof of mouth, jaw retraction/protraction and cervical retraction.    PT Home Exercise Plan  wear head and neck garment, self MLD, neck and jaw ROM exercises    Consulted and Agree with Plan of Care  Patient       Patient will benefit from skilled therapeutic intervention in order to improve the following deficits and impairments:  Postural dysfunction, Decreased strength, Increased edema, Decreased knowledge of precautions, Pain, Increased fascial restricitons, Decreased range of motion, Increased muscle spasms  Visit Diagnosis: Disorder of the skin and subcutaneous tissue related to radiation, unspecified  Lymphedema, not elsewhere classified  Abnormal  posture     Problem List Patient Active Problem List   Diagnosis Date Noted  . Hypokalemia 08/21/2019  . Warfarin anticoagulation 02/20/2019  . Hypercholesteremia 02/07/2019  . Recurrent deep vein thrombosis (DVT) ([place]) 02/06/2019  . Thoracic ascending aortic aneurysm ([place]) 02/06/2019  . Port-A-Cath in place 01/18/2019  . Mucositis due to chemotherapy 12/07/2018  . Hypomagnesemia 12/07/2018  . Thrush 12/07/2018  . Malignant neoplasm of tonsillar fossa ([place]) 10/17/2018  . 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 DVT, lower extremity, proximal ([place]) 05/24/2013  . Chronic anticoagulation 01/02/2013  . Superficial thrombosis of lower extremity 05/02/2012  . Benign essential HTN 01/04/2012  . Hyperlipidemia, mixed 01/04/2012    Ander Purpura , PT 09/13/2019, 5:10 PM  [place], [place], [address] Phone: [phone]   Fax:  [phone]  Name: DECLEN HOMRICH MRN: [id number] Date of Birth: 1961-03-23

## 2019-09-13 NOTE — Patient Instructions (Signed)
Open your mouth as wide as you  can, make your hand into a C shape and pull down on your chin until you feel a stretch in your jaw. Hold for 30 seconds repeat 2x.   2. Open your mouth as wide as you can and then with your and in a C shape cup your chin and pull down into a stretch holding for just 1-2 seconds repeat 10x  3. Move bottom jaw to the R or L as far as you can then gently use your hand to pull it the same direction further and hold 30 seconds repeat 2x on each side.   4. Look in a mirror. Line the middle line of your top and bottom teeth up. Keep your teeth in line and slowly open and close your mouth for 30 seconds  5. smile and lightly tap your teeth together for 30 seconds  Repeat these 2-3x/day.

## 2019-09-13 NOTE — Telephone Encounter (Signed)
[place] CSW Progress Notes  Call to patient at request of Dr Isidore Moos, unable to reach at home or mobile numbers.  Left VM w information on  [facility] services, encouraged to call back as needed.  Edwyna Shell, LCSW Clinical Social Worker Phone:  [phone] Cell:  [phone]

## 2019-09-14 ENCOUNTER — Ambulatory Visit: Payer: Medicaid Other

## 2019-09-14 NOTE — Telephone Encounter (Signed)
Left patient another voicemail. Will continue to follow. Eugenia Mcalpine

## 2019-09-20 ENCOUNTER — Telehealth: Payer: Self-pay

## 2019-09-20 NOTE — Telephone Encounter (Signed)
Left another voicemail asking for confirmation that patient's PICC has been pulled, or that it is being used/managed by ENT.  Asked patient to please call back and confirm today. Landis Gandy, RN

## 2019-09-20 NOTE — Telephone Encounter (Signed)
Patient's girlfriend is returning our call regarding PICC. She states we have called several times.  This is the first time they have returned our call even with multiple messages left starting 08-2019.   She has been advised the PICC will need to be removed ASAP per Dr Linus Salmons.  She was instructed to bring the patient to our office today for nurse visit for removal.  Appointment scheduled for today at 2:30 .  Bleu Minerd K Halana Deisher,RN

## 2019-09-20 NOTE — Telephone Encounter (Signed)
I am not sure what our next step can be.  She mentioned he had an ENT appointment today at 3 pm and the PICC was being used for other procedures in radiology and I informed her it was without your consent.  Something is not right about this situation.  The patient never calls and she calls on his behalf   Laverle Patter, RN

## 2019-09-21 ENCOUNTER — Encounter: Payer: Self-pay | Admitting: Physical Therapy

## 2019-09-21 ENCOUNTER — Ambulatory Visit: Payer: Medicaid Other | Attending: Radiation Oncology | Admitting: Physical Therapy

## 2019-09-21 ENCOUNTER — Ambulatory Visit: Payer: Medicaid Other | Admitting: *Deleted

## 2019-09-21 ENCOUNTER — Other Ambulatory Visit: Payer: Self-pay

## 2019-09-21 ENCOUNTER — Other Ambulatory Visit: Payer: Self-pay | Admitting: Hematology

## 2019-09-21 ENCOUNTER — Telehealth: Payer: Self-pay | Admitting: *Deleted

## 2019-09-21 DIAGNOSIS — I890 Lymphedema, not elsewhere classified: Secondary | ICD-10-CM | POA: Diagnosis not present

## 2019-09-21 DIAGNOSIS — L599 Disorder of the skin and subcutaneous tissue related to radiation, unspecified: Secondary | ICD-10-CM

## 2019-09-21 DIAGNOSIS — R1310 Dysphagia, unspecified: Secondary | ICD-10-CM | POA: Diagnosis not present

## 2019-09-21 DIAGNOSIS — B370 Candidal stomatitis: Secondary | ICD-10-CM

## 2019-09-21 DIAGNOSIS — R293 Abnormal posture: Secondary | ICD-10-CM | POA: Diagnosis not present

## 2019-09-21 MED ORDER — HYDROCODONE-ACETAMINOPHEN 10-325 MG PO TABS: 1.0000 | ORAL_TABLET | Freq: Three times a day (TID) | ORAL | 0 refills | Status: DC | PRN

## 2019-09-21 NOTE — Therapy (Signed)
[place], [place], [address] Phone: [phone]   Fax:  [phone]  Physical Therapy Treatment  Patient Details  Name: [name] [name] MRN: [id number] Date of Birth: April 13, 1961 Referring Provider (PT): Reita May Date: 09/21/2019  PT End of Session - 09/21/19 1712    Visit Number  15    Number of Visits  16    Date for PT Re-Evaluation  09/26/19    Authorization Type  pending medicaid approval- 3 visits per 30 day auth    Authorization - Visit Number  --    Authorization - Number of Visits  --    PT Start Time  [id number]    PT Stop Time  1550    PT Time Calculation (min)  45 min    Activity Tolerance  Patient tolerated treatment well    Behavior During Therapy  [facility] for tasks assessed/performed       Past Medical History:  Diagnosis Date  . 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 Benign essential HTN 01/04/2012  . Cancer ([place])    tonsilar  . Chronic anticoagulation 01/02/2013  . Dermatofibroma of forearm 01/02/2013   Left side  . Hyperlipidemia, mixed 01/04/2012  . 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 Primary hypercoagulable state ([place]) 10/01/2014  . Sinus bradycardia, chronic 01/04/2012  . Superficial thrombosis of lower extremity 05/02/2012    Past Surgical History:  Procedure Laterality Date  . DIRECT LARYNGOSCOPY Left 10/19/2018   Procedure: DIRECT LARYNGOSCOPY;  Surgeon: Leta Baptist, MD;  Location: [place];  Service: ENT;  Laterality: Left;  . IR IMAGING GUIDED PORT INSERTION  11/04/2018  . IR REMOVAL TUN ACCESS W/ PORT W/O FL MOD SED  05/01/2019  . TONSILLECTOMY Left 10/19/2018   Procedure: BIOPSY OF LEFT TONSIL;  Surgeon: Leta Baptist, MD;  Location: [place];  Service: ENT;  Laterality:  Left;    There were no vitals filed for this visit.  Subjective Assessment - 09/21/19 1514    Subjective  pt states she just had his PICC line out today .  He had  CT scan last week and 2 the week before. 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  Yes    Pain Score  4     Pain Location  Jaw    Pain Orientation  Left                       [facility] Adult PT Treatment/Exercise - 09/21/19 0001      Exercises   Exercises  Other Exercises    Other Exercises   max resistancet to jaw opening followed but quick jaw closer and repetitions of jaw opening and closing.  then stretch       Manual Therapy   Manual Therapy  Joint mobilization;Myofascial  release;Taping    Manual therapy comments  showed pt and Oswaldo Milian on [employer] as options.  She will contact Medicaid to see if they will over it     Edema Management  encouraged pt to continue use of Flexitouch     Joint Mobilization   internally to TMJ with distarction and oscillations with gloved thumb on back teeth     Soft tissue mobilization  to tight tissue at left side of neck with porlonged pressure at trigger points    some loosening felt    Myofascial Release  to scar on left side of neck     Manual Lymphatic Drainage (MLD)  briefly to head and neck     Kinesiotex  Edema      Kinesiotix   Edema  --   skin kote, I band with slits over tight tissue at left neck                 PT Long Term Goals - 08/29/19 1509      PT LONG TERM GOAL #1   Title  Pt will be able to independently manage lymphedema through self MLD and compression  garments    Baseline  04/25/19- pt to get measured for compression garments next week, he is still learning self MLD; pt doing self MLD a few times/day and wearing garment(s) daily-07/03/19    Time  4    Period  Weeks    Status  Achieved      PT LONG TERM GOAL #2   Title  Pt will receive appropriate compression garments for long term management of lymphedema    Baseline  04/25/19- pt will be measured for compression garments next week; wearing this daily now for 2-3 hours at a time-07/03/19    Time  4    Period  Weeks    Status  Achieved      PT LONG TERM GOAL #3   Title  Pt will reduce swelling at 8 cm superior to sternal notch by 2 cm to decrease risk of swelling.    Baseline  50.2 cm, 04/25/19- 50.4 but rest of neck demonstrates around 1.5 cm reduction, 06/01/19: 49.1; 42.5 cm - 07/03/19    Time  4    Period  Weeks    Status  Achieved      PT LONG TERM GOAL #4   Title  Pt to demonstrate 30 mm of jaw opening to allow pt to eat food.    Baseline  60mm- pt unable to eat    Time  4    Period  Weeks    Status  New    Target Date  09/26/19      PT LONG TERM GOAL #5   Title  Pt to demonstrate 8 cm of bilateral lateral jaw excursion to allow pt to return to PLOF    Baseline  R 60mm, L 71mm    Time  4    Period  Weeks    Status  New    Target Date  09/26/19      Additional Long Term Goals   Additional Long Term Goals  Yes      PT LONG TERM GOAL #6   Title  Pt will repot a 50% decrease in jaw pain to allow pt to return to prior level of function and be able to eat.    Time  4    Period  Weeks    Status  New  Target Date  09/26/19            Plan - 09/21/19 1713    Clinical Impression Statement  Pt struggles wtih thick saliva and decreased mouth opening.  He seemed to improve with soft tissue work and resistive then, active exercise today    Personal Factors and Comorbidities  Comorbidity 1    Comorbidities  history of multiple DVT and PE    Stability/Clinical Decision  Making  Evolving/Moderate complexity    Rehab Potential  Good    PT Frequency  Other (comment)    PT Duration  4 weeks    PT Treatment/Interventions  ADLs/Self Care Home Management;Therapeutic exercise;Manual lymph drainage;Manual techniques;Compression bandaging;Taping;Therapeutic activities;Patient/family education;Passive range of motion;Joint Manipulations    PT Next Visit Plan  jRenewal and medicaid reaurhorization Jaw massage, MLD to head and neck, try tongue to roof of mouth, jaw retraction/protraction and cervical retraction.    Consulted and Agree with Plan of Care  Patient       Patient will benefit from skilled therapeutic intervention in order to improve the following deficits and impairments:  Postural dysfunction, Decreased strength, Increased edema, Decreased knowledge of precautions, Pain, Increased fascial restricitons, Decreased range of motion, Increased muscle spasms  Visit Diagnosis: Disorder of the skin and subcutaneous tissue related to radiation, unspecified  Lymphedema, not elsewhere classified  Abnormal posture  Dysphagia, unspecified type     Problem List Patient Active Problem List   Diagnosis Date Noted  . Hypokalemia 08/21/2019  . Warfarin anticoagulation 02/20/2019  . Hypercholesteremia 02/07/2019  . Recurrent deep vein thrombosis (DVT) ([place]) 02/06/2019  . Thoracic ascending aortic aneurysm ([place]) 02/06/2019  . Port-A-Cath in place 01/18/2019  . Mucositis due to chemotherapy 12/07/2018  . Hypomagnesemia 12/07/2018  . Thrush 12/07/2018  . Malignant neoplasm of tonsillar fossa ([place]) 10/17/2018  . 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 DVT, lower extremity, proximal ([place]) 05/24/2013  . Chronic anticoagulation 01/02/2013  . Superficial thrombosis of lower extremity 05/02/2012  . Benign essential HTN 01/04/2012  . Hyperlipidemia, mixed 01/04/2012   Donato Heinz. Owens Shark  PT  [place] Levo 09/21/2019, 5:17 PM  [place], [place], [address] Phone: [phone]   Fax:  [phone]  Name: [name] [name] MRN: [id number] Date of Birth: 11-Jan-1961

## 2019-09-21 NOTE — Telephone Encounter (Signed)
Received vm message from patient's girlfriend, Manual Meier. She states that pt's appt at [facility] with ENT Dr. Silvio Clayman was canceled by that office yesterday. She is wondering what the next stwp is.  Also pt needs his Hydrocodone refilled. Last filled on 07/26/19 for # 40.  Please advise re:ENT appt and refill

## 2019-09-21 NOTE — Telephone Encounter (Signed)
I will give him a call tomorrow. Liliane Channel

## 2019-09-21 NOTE — Telephone Encounter (Signed)
I sent an email to his providers on a previous email string to see if they can help.

## 2019-09-21 NOTE — Progress Notes (Signed)
Per verbal order from Dr Linus Salmons, 49 cm Single Lumen Peripherally Inserted Central Catheter removed from right basilic, tip intact. No sutures present. RN confirmed length per chart. Dressing was clean and dry. Petroleum dressing applied. Pt advised no heavy lifting with this arm, leave dressing for 24 hours and call the office or seek emergent care if dressing becomes soaked with blood or sharp pain presents. Patient verbalized understanding and agreement.  Patient's questions answered to their satisfaction. Patient tolerated procedure well, RN walked patient to check out. Landis Gandy, RN

## 2019-09-21 NOTE — Telephone Encounter (Signed)
I can refill his Norco. I am not sure why his visit with Dr. Nicolette Bang was cancelled, so I would advise them to call his office and find out when the follow-up is going to be set up there.  Liliane Channel, can you communicate with the patient that the tumor board recommended possible trans-oral biopsy of the lesion, and if IR can do it here at [facility], we will try to facilitate that?  Thanks.  Dr. Maylon Peppers

## 2019-09-24 ENCOUNTER — Emergency Department
Admission: EM | Admit: 2019-09-24 | Discharge: 2019-09-24 | Disposition: A | Discharge status: Home / Self Care | Payer: Medicaid Other | Attending: Emergency Medicine | Admitting: Emergency Medicine

## 2019-09-24 ENCOUNTER — Emergency Department: Payer: Medicaid Other

## 2019-09-24 ENCOUNTER — Other Ambulatory Visit: Payer: Self-pay

## 2019-09-24 ENCOUNTER — Encounter: Payer: Self-pay | Admitting: Emergency Medicine

## 2019-09-24 DIAGNOSIS — R0602 Shortness of breath: Secondary | ICD-10-CM | POA: Insufficient documentation

## 2019-09-24 DIAGNOSIS — Z87891 Personal history of nicotine dependence: Secondary | ICD-10-CM | POA: Diagnosis not present

## 2019-09-24 DIAGNOSIS — Z7901 Long term (current) use of anticoagulants: Secondary | ICD-10-CM | POA: Diagnosis not present

## 2019-09-24 DIAGNOSIS — I10 Essential (primary) hypertension: Secondary | ICD-10-CM | POA: Insufficient documentation

## 2019-09-24 DIAGNOSIS — Z79899 Other long term (current) drug therapy: Secondary | ICD-10-CM | POA: Diagnosis not present

## 2019-09-24 DIAGNOSIS — K117 Disturbances of salivary secretion: Secondary | ICD-10-CM | POA: Diagnosis not present

## 2019-09-24 DIAGNOSIS — R05 Cough: Secondary | ICD-10-CM | POA: Diagnosis not present

## 2019-09-24 DIAGNOSIS — R918 Other nonspecific abnormal finding of lung field: Secondary | ICD-10-CM | POA: Diagnosis not present

## 2019-09-24 LAB — CBC WITH DIFFERENTIAL/PLATELET
Abs Immature Granulocytes: 0.04 10*3/uL (ref 0.00–0.07)
Basophils Absolute: 0 10*3/uL (ref 0.0–0.1)
Basophils Relative: 0 %
Eosinophils Absolute: 0.1 10*3/uL (ref 0.0–0.5)
Eosinophils Relative: 1 %
HCT: 35.9 % — ABNORMAL LOW (ref 39.0–52.0)
Hemoglobin: 11.9 g/dL — ABNORMAL LOW (ref 13.0–17.0)
Immature Granulocytes: 0 %
Lymphocytes Relative: 5 %
Lymphs Abs: 0.6 10*3/uL — ABNORMAL LOW (ref 0.7–4.0)
MCH: 29.5 pg (ref 26.0–34.0)
MCHC: 33.1 g/dL (ref 30.0–36.0)
MCV: 88.9 fL (ref 80.0–100.0)
Monocytes Absolute: 0.9 10*3/uL (ref 0.1–1.0)
Monocytes Relative: 8 %
Neutro Abs: 9.4 10*3/uL — ABNORMAL HIGH (ref 1.7–7.7)
Neutrophils Relative %: 86 %
Platelets: 312 10*3/uL (ref 150–400)
RBC: 4.04 MIL/uL — ABNORMAL LOW (ref 4.22–5.81)
RDW: 13.7 % (ref 11.5–15.5)
WBC: 11 10*3/uL — ABNORMAL HIGH (ref 4.0–10.5)
nRBC: 0 % (ref 0.0–0.2)

## 2019-09-24 LAB — BASIC METABOLIC PANEL WITH GFR
Anion gap: 9 (ref 5–15)
BUN: 16 mg/dL (ref 6–20)
CO2: 30 mmol/L (ref 22–32)
Calcium: 9.6 mg/dL (ref 8.9–10.3)
Chloride: 93 mmol/L — ABNORMAL LOW (ref 98–111)
Creatinine, Ser: 0.83 mg/dL (ref 0.61–1.24)
GFR calc Af Amer: >60 mL/min (ref >60)
GFR calc non Af Amer: >60 mL/min (ref >60)
Glucose, Bld: 136 mg/dL — ABNORMAL HIGH (ref 70–99)
Potassium: 3.8 mmol/L (ref 3.5–5.1)
Sodium: 132 mmol/L — ABNORMAL LOW (ref 135–145)

## 2019-09-24 LAB — PROTIME-INR
INR: 1.3 — ABNORMAL HIGH (ref 0.8–1.2)
Prothrombin Time: 15.7 seconds — ABNORMAL HIGH (ref 11.4–15.2)

## 2019-09-24 MED ORDER — AZITHROMYCIN 250 MG PO TABS: ORAL_TABLET | ORAL | 0 refills | Status: AC

## 2019-09-24 MED ORDER — PREDNISONE 10 MG (21) PO TBPK: ORAL_TABLET | ORAL | 0 refills | Status: DC

## 2019-09-24 MED ORDER — IOHEXOL 300 MG/ML  SOLN
75.0000 mL | Freq: Once | INTRAMUSCULAR | Status: AC | PRN
  Administered 2019-09-24: 75 mL via INTRAVENOUS

## 2019-09-24 NOTE — ED Notes (Signed)
Dr. Siadecki at bedside 

## 2019-09-24 NOTE — Discharge Instructions (Addendum)
Take the steroid course and antibiotic as prescribed and finish the full duration of both.  You should follow-up with ENT at [facility] next week as scheduled.  We have also provided referral information for one of our area ENT practices in case you want to switch your care there.  You should follow-up with your oncologist and primary care doctor as scheduled.  Your INR was 1.3 today which is slightly low.  You should have your INR rechecked in the next 1 to 2 weeks by your doctor.  Return to the ER immediately for new, worsening, or persistent severe shortness of breath, worsening secretions, difficulty swallowing, fever, chest pain, or any other new or worsening symptoms that concern you.

## 2019-09-24 NOTE — ED Notes (Signed)
Pt given phone to use 

## 2019-09-24 NOTE — ED Triage Notes (Signed)
FIRST NURSE NOTE:  Shortness of breath started this morning, EMS checked patient at home and advised oxygen sats were normal and ok for patient to arrive via POV.

## 2019-09-24 NOTE — ED Provider Notes (Signed)
[facility] Emergency Department Provider Note ____________________________________________   First MD Initiated Contact with Patient 09/24/19 [phone]     (approximate)  I have reviewed the triage vital signs and the nursing notes.   HISTORY  Chief Complaint Shortness of Breath    HPI [name] [name] is a 59 y.o. 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  The patient states that he has increased secretions in his mouth and throat which was diagnosed as possible thrush.  He states that the increase secretions intermittently make him feel like he is choking.  He also reports some cough but denies fever or vomiting.  He has no chest pain or acute leg swelling.  The patient states that he was on a course of amphotericin for possible thrush without any improvement.  He is able to swallow and eat soft foods.   Past Medical History:  Diagnosis Date  . 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 Benign essential HTN 01/04/2012  . Cancer ([place])    tonsilar  . Chronic anticoagulation 01/02/2013  . Dermatofibroma of forearm 01/02/2013   Left side  . Hyperlipidemia, mixed 01/04/2012  . 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 Primary hypercoagulable state ([place]) 10/01/2014  . Sinus bradycardia, chronic 01/04/2012  . Superficial thrombosis of lower extremity 05/02/2012    Patient Active Problem List   Diagnosis Date Noted  . Hypokalemia 08/21/2019  . Warfarin anticoagulation 02/20/2019  . Hypercholesteremia 02/07/2019  . Recurrent deep vein thrombosis (DVT) ([place]) 02/06/2019  .  Thoracic ascending aortic aneurysm ([place]) 02/06/2019  . Port-A-Cath in place 01/18/2019  . Mucositis due to chemotherapy 12/07/2018  . Hypomagnesemia 12/07/2018  . Thrush 12/07/2018  . Malignant neoplasm of tonsillar fossa ([place]) 10/17/2018  . 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 DVT, lower extremity, proximal ([place]) 05/24/2013  . Chronic anticoagulation 01/02/2013  . Superficial thrombosis of lower extremity 05/02/2012  . Benign essential HTN 01/04/2012  . Hyperlipidemia, mixed 01/04/2012    Past Surgical History:  Procedure Laterality Date  . DIRECT LARYNGOSCOPY Left 10/19/2018   Procedure: DIRECT LARYNGOSCOPY;  Surgeon: Leta Baptist, MD;  Location: [place];  Service: ENT;  Laterality: Left;  . IR IMAGING GUIDED PORT INSERTION  11/04/2018  . IR REMOVAL TUN ACCESS W/ PORT W/O FL MOD SED  05/01/2019  . TONSILLECTOMY Left 10/19/2018   Procedure: BIOPSY OF LEFT TONSIL;  Surgeon: Leta Baptist, MD;  Location: [place];  Service: ENT;  Laterality: Left;    Prior to Admission medications   Medication Sig Start Date End Date Taking? Authorizing Provider  amLODipine (NORVASC) 10 MG tablet Take 1 tablet (10 mg total) by mouth daily. 03/15/19   [name], MD  atorvastatin (LIPITOR) 20 MG tablet Take 1 tablet (20 mg total) by mouth daily. Patient not taking: Reported on 08/31/2019 02/07/19 02/07/20  [name], MD  azithromycin (ZITHROMAX Z-PAK) 250 MG tablet Take 2 tablets (500 mg) on  Day 1,  followed by 1 tablet (250 mg) once  daily on Days 2 through 5. 09/24/19 09/29/19  Arta Silence, MD  cyclobenzaprine (FLEXERIL) 10 MG tablet TAKE 1 TABLET BY MOUTH THREE TIMES A DAY AS NEEDED FOR MUSCLE SPASMS 08/29/19   Tish Men, MD  HYDROcodone-acetaminophen [facility]) 10-325 MG tablet Take 1 tablet by mouth every 8 (eight) hours as needed. 09/21/19   Tish Men, MD  ibuprofen (ADVIL) 200 MG tablet  Take 600 mg by mouth every 6 (six) hours as needed for pain or fever.     [provider]  Isavuconazonium Sulfate 186 MG CAPS Take 1 capsule (186 mg total) by mouth as directed. Load with 2 capsules every 8 hours x 6 doses, then 2 capsules daily the following day Patient not taking: Reported on 07/24/2019 06/07/19   Thayer Headings, MD  pentoxifylline (TRENTAL) 400 MG CR tablet Take 400mg  tab daily x 1 week, then 400mg  BID; take with food. Upset stomach is rare on this med, but stop if you have upset stomach. 09/08/19   Eppie Gibson, MD  predniSONE (STERAPRED UNI-PAK 21 TAB) 10 MG (21) TBPK tablet Take 5 tabs (50mg ) PO on day 1, then 4 tabs on day 2, 3 tabs on day 3, 2 tabs on day 4, and 1 tab on day 5 09/24/19   Arta Silence, MD  sodium fluoride (PREVIDENT 5000 PLUS) 1.1 % CREA dental cream Apply to tooth brush. Brush teeth for 2 minutes. Spit out excess. DO NOT rinse afterwards. Repeat nightly. Patient not taking: Reported on 08/14/2019 10/24/18   Lenn Cal, DDS  vitamin E 180 MG (400 UNITS) capsule Take 400 IU daily x 1 week, then 400 IU BID 09/08/19   Eppie Gibson, MD  warfarin (COUMADIN) 10 MG tablet Take 1 tablet of 10 mg warfarin daily except on Mondays, Wednesdays and Fridays, take only 1/2 tablet (5 mg). 05/22/19   Pennie Banter, RPH-CPP    Allergies Patient has no known allergies.  Family History  Problem Relation Age of Onset  . Stroke Father     Social History Social History   Tobacco Use  . Smoking status: Former Smoker    Packs/day: 0.50    Years: 30.00    Pack years: 15.00    Types: Cigarettes    Quit date: 10/19/2018    Years since quitting: 0.9  . Smokeless tobacco: Never Used  Substance Use Topics  . Alcohol use: Yes    Alcohol/week: 13.0 - 14.0 standard drinks    Types: 12 Cans of beer, 1 - 2 Shots of liquor per week    Comment: 1-2 times per week.  . Drug use: No    Review of Systems  Constitutional: No fever. Eyes: No redness. ENT:  Positive for mouth and throat discomfort. Cardiovascular: Denies chest pain. Respiratory: Positive for shortness of breath. Gastrointestinal: No vomiting or diarrhea.  Genitourinary: Negative for flank pain.  Musculoskeletal: Negative for back pain. Skin: Negative for rash. Neurological: Negative for headache.   ____________________________________________   PHYSICAL EXAM:  VITAL SIGNS: ED Triage Vitals  Enc Vitals Group     BP 09/24/19 0908 (!) 112/59     Pulse Rate 09/24/19 0908 (!) 50     Resp 09/24/19 0908 (!) 24     Temp 09/24/19 0908 98.5 F (36.9 C)     Temp Source 09/24/19 0908 Oral     SpO2 09/24/19 0908 98 %     Weight 09/24/19 0909 205 lb (93 kg)     Height 09/24/19 0909 6\' 3"  (  1.905 m)     Head Circumference --      Peak Flow --      Pain Score 09/24/19 0909 3     Pain Loc --      Pain Edu? --      Excl. in [place]? --     Constitutional: Alert and oriented.  Relatively well appearing and in no acute distress. Eyes: Conjunctivae are normal.  Head: Atraumatic. Nose: No congestion/rhinnorhea. Mouth/Throat: Mucous membranes are moist.  Oropharyngeal examination limited due to trismus, however visualized posterior oropharynx shows no swelling.  Some secretions to the tongue and mouth which the patient is able to clear, with no obvious thrush.  Coarse upper airway sounds with no stridor. Neck: Normal range of motion.  Cardiovascular: Normal rate, regular rhythm. Grossly normal heart sounds.  Good peripheral circulation. Respiratory: Normal respiratory effort.  No retractions. Lungs CTAB. Gastrointestinal: No distention.  Musculoskeletal: No lower extremity edema.  Extremities warm and well perfused.  Neurologic:  Normal speech and language. No gross focal neurologic deficits are appreciated.  Skin:  Skin is warm and dry. No rash noted. Psychiatric: Mood and affect are normal. Speech and behavior are normal.  ____________________________________________    LABS (all labs ordered are listed, but only abnormal results are displayed)  Labs Reviewed  BASIC METABOLIC PANEL - Abnormal; Notable for the following components:      Result Value   Sodium 132 (*)    Chloride 93 (*)    Glucose, Bld 136 (*)    All other components within normal limits  CBC WITH DIFFERENTIAL/PLATELET - Abnormal; Notable for the following components:   WBC 11.0 (*)    RBC 4.04 (*)    Hemoglobin 11.9 (*)    HCT 35.9 (*)    Neutro Abs 9.4 (*)    Lymphs Abs 0.6 (*)    All other components within normal limits  PROTIME-INR - Abnormal; Notable for the following components:   Prothrombin Time 15.7 (*)    INR 1.3 (*)    All other components within normal limits   ____________________________________________  EKG  Patient declined ____________________________________________  RADIOLOGY  CXR: Right upper lobe opacity CT soft tissue neck: Pharyngeal soft tissue swelling with no significant change when compared to CT of 2/22  ____________________________________________   PROCEDURES  Procedure(s) performed: No  Procedures  Critical Care performed: No ____________________________________________   INITIAL IMPRESSION / ASSESSMENT AND PLAN / ED COURSE  Pertinent labs & imaging results that were available during my care of the patient were reviewed by me and considered in my medical decision making (see chart for details).  58 year old male with PMH as noted above presents with shortness of breath and increased oral secretions.  The patient states that the secretions are chronic problem worsening gradually over approximately the last 6 months, however the shortness of breath acutely worsened over the last 1 to 2 days.  I reviewed the past medical records in [place].  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  The  patient had persistent oropharyngeal secretions and thrush which was treated with a course of IV amphotericin.  The patient had no response to this, so the thrush was determined to be noninfectious in nature.  In addition, the patient has a prior history of DVT and PE; he failed treatment with Eliquis and is on warfarin.  On exam, the  vital signs are normal.  The patient's O2 saturation is in the high 90s on room air.  He has some oral secretions which he is spitting up but is able to speak clearly swallow, and breathe comfortably in a sitting position.  He does have slightly coarse upper airway sounds but no stridor.  The lungs are clear.  The patient follows with ENT at [facility].  He was last seen there on 2/21.  He had a CT on 2/22 showing increased lobular soft tissue at the tongue base and increased mucosal irregularity of the anterolateral left pharyngeal wall with soft tissue thickening in the left tonsillar region.  This was concerning for recurrent tumor.  At this time, the patient's airway is intact.  Overall presentation is consistent with symptoms related to the patient's SCC or persistent noninfectious thrush.  Differential also includes aspiration or other pneumonia, or possible bronchitis.  Given the recent worsening of the symptoms over the last several days, I will obtain a repeat CT as well as a chest x-ray to evaluate for worsening swelling or other findings that may compromise the patient's airway, as well as basic labs.  At this time, there is no evidence of PE given that the patient does not tachycardic or hypoxic and has no chest pain.  ----------------------------------------- 12:32 PM on 09/24/2019 -----------------------------------------  Chest x-ray shows a possible right upper lobe infiltrate.  The CT shows pharyngeal soft tissue thickening with no significant change when compared to the CT obtained on 2/22.  On reassessment, the patient continues to appear comfortable  with stable vital signs.  I counseled him on the results of the work-up.  The patient states he would like to go home.  Given that he has no hypoxia, increased work of breathing or stridor, and is able to tolerate his secretions and swallow, he is stable for discharge.  The secretions are chronic in nature.  Given the finding of the possible right upper lobe infiltrate, I will prescribe empiric antibiotics for mild/early CAP.  I suspect somewhat of a bronchitic component, and I think a short taper of steroids would be helpful.  The patient agrees with this plan.  He plans to follow-up with ENT next week.  I gave him very thorough return precautions and he expressed understanding.  ____________________________________________   FINAL CLINICAL IMPRESSION(S) / ED DIAGNOSES  Final diagnoses:  Shortness of breath      NEW MEDICATIONS STARTED DURING THIS VISIT:  New Prescriptions   AZITHROMYCIN (ZITHROMAX Z-PAK) 250 MG TABLET    Take 2 tablets (500 mg) on  Day 1,  followed by 1 tablet (250 mg) once daily on Days 2 through 5.   PREDNISONE (STERAPRED UNI-PAK 21 TAB) 10 MG (21) TBPK TABLET    Take 5 tabs (50mg ) PO on day 1, then 4 tabs on day 2, 3 tabs on day 3, 2 tabs on day 4, and 1 tab on day 5     Note:  This document was prepared using Dragon voice recognition software and may include unintentional dictation errors.    Arta Silence, MD 09/24/19 1235

## 2019-09-24 NOTE — ED Notes (Signed)
This RN attempted EKG due to patient's c/o SOB, pt states, "well it's because of all this mess in my throat", no EKG obtained. IV initiated. Pt asking for suction in triage, this RN explained no suction available in triage, pt states "well do I need to go to another hospital?" This RN explained to patient that triage would be completed and suction available in ED room if needed.

## 2019-09-24 NOTE — ED Notes (Signed)
Pt transported for CT 

## 2019-09-24 NOTE — ED Triage Notes (Signed)
Pt presents to ED via POV with c/o SOB. Pt c/o "a lot of congestion and 'thrush' ". Pt states "I have this mucous and film that's been building in my mouth for the last 6 months, I've been treated for thrush". Pt with noted surgical bandage to L side of his neck, pt states hx of tonsilar cancer, under went chemo and radiation last year for same. Pt states band is for lymphedema is his neck. Pt states feels like he has difficulty swallowing sometimes.

## 2019-09-25 ENCOUNTER — Telehealth: Payer: Self-pay | Admitting: Adult Health Nurse Practitioner

## 2019-09-25 ENCOUNTER — Telehealth: Payer: Self-pay | Admitting: *Deleted

## 2019-09-25 NOTE — Telephone Encounter (Signed)
I agree

## 2019-09-25 NOTE — Telephone Encounter (Signed)
Received call from [facility] with [facility]. Patient has contacted their offices for palliative care services. OV is not required for this. Langley Gauss states patient is having difficulty managing his secretions 2/2 cancer as well as SHOB. Verbal auth given. Will route to PCP for agreement/denial. Hubbard Hartshorn, RN, BSN

## 2019-09-26 ENCOUNTER — Ambulatory Visit: Payer: Medicaid Other | Admitting: Physical Therapy

## 2019-09-26 ENCOUNTER — Encounter: Payer: Self-pay | Admitting: Physical Therapy

## 2019-09-26 ENCOUNTER — Other Ambulatory Visit: Payer: Self-pay

## 2019-09-26 DIAGNOSIS — I890 Lymphedema, not elsewhere classified: Secondary | ICD-10-CM | POA: Diagnosis not present

## 2019-09-26 DIAGNOSIS — L599 Disorder of the skin and subcutaneous tissue related to radiation, unspecified: Secondary | ICD-10-CM

## 2019-09-26 DIAGNOSIS — R1310 Dysphagia, unspecified: Secondary | ICD-10-CM | POA: Diagnosis not present

## 2019-09-26 DIAGNOSIS — R293 Abnormal posture: Secondary | ICD-10-CM

## 2019-09-26 NOTE — Therapy (Signed)
[place], [place], [address] Phone: [phone]   Fax:  [phone]  Physical Therapy Treatment  Patient Details  Name: [name] [name] MRN: [id number] Date of Birth: 05/17/1961 Referring Provider (PT): Reita May Date: 09/26/2019  PT End of Session - 09/26/19 1747    Visit Number  16    Number of Visits  24    Date for PT Re-Evaluation  11/03/19    PT Start Time  1510    PT Stop Time  1600    PT Time Calculation (min)  50 min    Activity Tolerance  Patient tolerated treatment well    Behavior During Therapy  [facility] for tasks assessed/performed       Past Medical History:  Diagnosis Date  . 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 Benign essential HTN 01/04/2012  . Cancer ([place])    tonsilar  . Chronic anticoagulation 01/02/2013  . Dermatofibroma of forearm 01/02/2013   Left side  . Hyperlipidemia, mixed 01/04/2012  . 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 Primary hypercoagulable state ([place]) 10/01/2014  . Sinus bradycardia, chronic 01/04/2012  . Superficial thrombosis of lower extremity 05/02/2012    Past Surgical History:  Procedure Laterality Date  . DIRECT LARYNGOSCOPY Left 10/19/2018   Procedure: DIRECT LARYNGOSCOPY;  Surgeon: Leta Baptist, MD;  Location: [place];  Service: ENT;  Laterality: Left;  . IR IMAGING GUIDED PORT INSERTION  11/04/2018  . IR REMOVAL TUN ACCESS W/ PORT W/O FL MOD SED  05/01/2019  . TONSILLECTOMY Left 10/19/2018   Procedure: BIOPSY OF LEFT TONSIL;  Surgeon: Leta Baptist, MD;  Location: [place];  Service: ENT;  Laterality: Left;    There were no vitals filed for this visit.  Subjective Assessment - 09/26/19 1512    Subjective  Pt went to the emergency room 2 days ago  because he had so much mucus and he couldn't speak  He said he got antibiotic and steroids and he is doing a little better now.    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  No/denies         [facility] PT Assessment - 09/26/19 0001      Assessment   Medical Diagnosis  L tonsil squamous cell carcinoma    Referring Provider (PT)  Isidore Moos    Onset Date/Surgical Date  10/19/18      Prior Function   Level of Independence  Independent                   [facility] Adult PT Treatment/Exercise - 09/26/19 0001      Exercises   Exercises  Other Exercises    Other Exercises   max resistancet to jaw opening followed but quick jaw closer and repetitions of jaw opening and closing.  then stretch       Manual Therapy   Manual Therapy  Joint mobilization;Myofascial release;Taping    Joint Mobilization   internally to TMJ with distarction and oscillations with gloved thumb on back teeth     Soft tissue mobilization  to tight tissue at left side of neck with  porlonged pressure at trigger points    some loosening felt    Myofascial Release  to scar on left side of neck  and ear pulling     Manual Lymphatic Drainage (MLD)  briefly to head and neck                   PT Long Term Goals - 09/26/19 1742      PT LONG TERM GOAL #1   Title  Pt will be able to independently manage lymphedema through self MLD and compression garments    Baseline  pt needs review of how to properly use compression garments and flexitouch    Time  4    Period  Weeks    Status  On-going      PT LONG TERM GOAL #2   Title  Pt will receive appropriate compression garments for long term management of lymphedema    Status  Achieved      PT LONG TERM GOAL #3   Title  Pt will reduce swelling at 8 cm superior to sternal notch by 2 cm to decrease risk of swelling.     Baseline  50.2 cm, 04/25/19- 50.4 but rest of neck demonstrates around 1.5 cm reduction, 06/01/19: 49.1; 42.5 cm - 07/03/19  40 on 09/26/2019    Status  Achieved      PT LONG TERM GOAL #4   Title  Pt to demonstrate 30 mm of jaw opening to allow pt to eat food.    Baseline  6mm- pt unable to eat, on 3/9/202115  mm only able to eat soup    Time  4    Period  Weeks    Status  On-going      PT LONG TERM GOAL #5   Title  Pt to demonstrate 8 cm of bilateral lateral jaw excursion to allow pt to return to PLOF    Baseline  R 37mm, L 71mm  on 09/26/3019  R 7 mm, L 10 mm    Time  4    Period  Weeks    Status  On-going      PT LONG TERM GOAL #6   Title  Pt will repot a 50% decrease in jaw pain to allow pt to return to prior level of function and be able to eat.    Time  4    Period  Weeks    Status  Achieved            Plan - 09/26/19 1748    Clinical Impression Statement  Pt appears to be doing better than last visit even though he had to go to ED 2 days about because of difficulty managing secretions and inability to speak.  He continues to have severe tightness in neck and is struggling with trismus.  He feels that he is doing better with PT and wants to continue so recert was sent as well as reauthorization to Medicaid    Personal Factors and Comorbidities  Comorbidity 1    Comorbidities  history of multiple DVT and PE    Stability/Clinical Decision Making  Evolving/Moderate complexity    Rehab Potential  Good    PT Frequency  Other (comment)    PT Duration  4 weeks    PT Treatment/Interventions  ADLs/Self Care Home Management;Therapeutic exercise;Manual lymph drainage;Manual techniques;Compression bandaging;Taping;Therapeutic activities;Patient/family education;Passive range of motion;Joint Manipulations    PT Next Visit Plan  myofascial relase to haand and neck,  Jaw massag amd  mobilizaion  MLD to head and neck, try tongue to roof of mouth, jaw retraction/protraction and cervical  retraction.    PT Home Exercise Plan  wear head and neck garment, self MLD, neck and jaw ROM exercises    Consulted and Agree with Plan of Care  Patient       Patient will benefit from skilled therapeutic intervention in order to improve the following deficits and impairments:  Postural dysfunction, Decreased strength, Increased edema, Decreased knowledge of precautions, Pain, Increased fascial restricitons, Decreased range of motion, Increased muscle spasms  Visit Diagnosis: Disorder of the skin and subcutaneous tissue related to radiation, unspecified - Plan: PT plan of care cert/re-cert  Lymphedema, not elsewhere classified - Plan: PT plan of care cert/re-cert  Abnormal posture - Plan: PT plan of care cert/re-cert     Problem List Patient Active Problem List   Diagnosis Date Noted  . Hypokalemia 08/21/2019  . Warfarin anticoagulation 02/20/2019  . Hypercholesteremia 02/07/2019  . Recurrent deep vein thrombosis (DVT) ([place]) 02/06/2019  . Thoracic ascending aortic aneurysm ([place]) 02/06/2019  . Port-A-Cath in place 01/18/2019  . Mucositis due to chemotherapy 12/07/2018  . Hypomagnesemia 12/07/2018  . Thrush 12/07/2018  . Malignant neoplasm of tonsillar fossa ([place]) 10/17/2018  . 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 DVT, lower extremity, proximal ([place]) 05/24/2013  . Chronic anticoagulation 01/02/2013  . Superficial thrombosis of lower extremity 05/02/2012  . Benign essential HTN 01/04/2012  . Hyperlipidemia, mixed 01/04/2012   [name]  Norwood Levo 09/26/2019, 5:53 PM  [place], [place], [address] Phone: [phone]   Fax:  [phone]  Name: KIRKLIN BARNO MRN: [id number] Date of Birth: May 15, 1961

## 2019-09-27 ENCOUNTER — Other Ambulatory Visit: Payer: Medicaid Other | Admitting: Adult Health Nurse Practitioner

## 2019-09-27 ENCOUNTER — Telehealth: Payer: Self-pay | Admitting: *Deleted

## 2019-09-27 DIAGNOSIS — Z515 Encounter for palliative care: Secondary | ICD-10-CM

## 2019-09-27 DIAGNOSIS — C090 Malignant neoplasm of tonsillar fossa: Secondary | ICD-10-CM

## 2019-09-27 NOTE — Telephone Encounter (Signed)
Pt called stating he has appt with ENT provider Dr. Claud Kelp at [facility] on  10/02/19.  Wanted to know if Dr. Maylon Peppers thinks pt should go.  Dr. Maylon Peppers notified.  Informed pt that per Dr. Maylon Peppers,  Pt needs to go for the appt 10/02/19 for further evaluation. Pt voiced understanding.

## 2019-09-28 ENCOUNTER — Ambulatory Visit: Payer: Medicaid Other

## 2019-09-28 ENCOUNTER — Other Ambulatory Visit: Payer: Self-pay

## 2019-09-28 ENCOUNTER — Telehealth: Payer: Self-pay | Admitting: Adult Health Nurse Practitioner

## 2019-09-28 DIAGNOSIS — R1310 Dysphagia, unspecified: Secondary | ICD-10-CM

## 2019-09-28 NOTE — Telephone Encounter (Signed)
Spoke with SO about recommended medications of gabapentin and glycopyrrolate, that Dr. Maylon Peppers agreed to and would like this provider to prescribe.  Discussed that we will start patient on gabapentin 100 mg Q8hrs PRN for pain and to monitor for day time drowsiness.  Also starting glycopyrrolate 1 mg 1-2 tabs Q8 hrs PRN for secretions. Eprescribed these to [facility] in [place].  Discussed referral to another ENT for second opinion and that Dr. Maylon Peppers has agreed to send in referral for him.  Also discussed getting established with a new provider that can come to the home, which will be good for his increased weakness.  Have discussed this case with new provider who has accepted him as a new patient.  Did discuss that these new providers may be dependent on Medicaid approval.   [name] Pinnock K. Olena Heckle NP

## 2019-09-28 NOTE — Progress Notes (Signed)
[facility] Consult Note Telephone: [phone]  Fax: [phone]  PATIENT NAME: [name] [name] DOB: Nov 25, 1960 MRN: [id number]  PRIMARY CARE PROVIDER:   [name], MD  REFERRING PROVIDER:  [name], MD [address] [place],  [place] [address]  RESPONSIBLE PARTY:  self [place], [place] H: [phone]  C: [phone]    RECOMMENDATIONS and PLAN:  1.  Advanced care planning.  Full code.  Did not go over ACP today. Did discuss disease progression and goals of care.  Patient states that he would like to pursue treatment if he is able to go through any cancer treatment.  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  In December 2020 he started having trismus and excessive secretions. Patient is working with oncology therapy for the trismus and is slowly being able to open his mouth wider.  He has been drinking supplemental drinks and states that last night was able to eat some chicken noodle soup and tuna salad.  He lost 20 pounds during his cancer treatment and has lost another 40 pounds over the last 3 months due to his trismus.  States that the excessive secretions makes things have a poor taste.  He has been found to have swelling of his esophagus and during an endoscopy was found to have Ensure in his throat from hours earlier.  Patient has had recent CT scan that showed possible recurrence.  Biopsy was done that was negative and is going in on 10/02/2019 for further biopsies.  Patient has had suction machine ordered to help with the secretions.  Discussed at length the options of trach and PEG tube placement.  It has been suggested to him to have these placed to help with his secretions and to help get the nutrition he needs.  His SO does state that he is working with a Automotive engineer.  Discussed that if he does not start getting more calories he will continue to get weaker and  may not be able to go through cancer treatment if needed.  Discussed the possibility of hospice.  States that he wants to see in a week if he is able to start eating more orally before making a decision on a PEG tube.  He is being followed by Dr. Claud Kelp with ENT and Dr. Maylon Peppers with oncology.    3.  Secretions.  Patient has been treated for thrush when the secretions started.  He has been seen by ID.  Sputum cultures showed 2 types of fungal infection and he was treated with amphotericin B.  This has not stopped the secretions.  He has been using Biotene, scrapping his tongue, trying to drink plenty of water with no relief.  He is currently being treated for pneumonia believed to be due to aspiration. He stated prior to going to ER with SOB he had an episode when he got up in the morning really SOB due to the secretions.  He now sleeps on the couch with his head elevated on multiple pillows. Have reached out to Dr. Maylon Peppers with recommendation for glycopyrrolate for secretions to see if this will offer any relief.    4.  Pain.  He has pain in his jaw more on the left side and describes this a shooting pain.  States that most days he can tolerate but some days it is excruciating.  He has tried ibuprofen and Norco 5/325 with little relief.  Have reached out to Dr. Maylon Peppers with recommendation for gabapentin.  Palliative will continue to monitor for symptom management/decline and make recommendations.  Will call Monday for effectiveness of new medications.  Have next appointment in 3 weeks.  Will continue to go over goals of care.  I spent 120 minutes providing this consultation,  from 4:30 to 6:30. More than 50% of the time in this consultation was spent coordinating communication.   HISTORY OF PRESENT ILLNESS:  [name] [name] is a 59 y.o. year old male with multiple medical problems including tonsillar cancer,HTN, HLD, h/o DVT and PE on coumadin . Palliative Care was asked to help address goals of care.   CODE  STATUS: see above  PPS: 50% HOSPICE ELIGIBILITY/DIAGNOSIS: TBD  PHYSICAL EXAM:   General: NAD, frail appearing, thin Extremities: no edema, lockjaw Neurological: Weakness but otherwise nonfocal  PAST MEDICAL HISTORY:  Past Medical History:  Diagnosis Date  . 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 Benign essential HTN 01/04/2012  . Cancer ([place])    tonsilar  . Chronic anticoagulation 01/02/2013  . Dermatofibroma of forearm 01/02/2013   Left side  . Hyperlipidemia, mixed 01/04/2012  . 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 Primary hypercoagulable state ([place]) 10/01/2014  . Sinus bradycardia, chronic 01/04/2012  . Superficial thrombosis of lower extremity 05/02/2012    SOCIAL HX:  Social History   Tobacco Use  . Smoking status: Former Smoker    Packs/day: 0.50    Years: 30.00    Pack years: 15.00    Types: Cigarettes    Quit date: 10/19/2018    Years since quitting: 0.9  . Smokeless tobacco: Never Used  Substance Use Topics  . Alcohol use: Yes    Alcohol/week: 13.0 - 14.0 standard drinks    Types: 12 Cans of beer, 1 - 2 Shots of liquor per week    Comment: 1-2 times per week.    ALLERGIES: No Known Allergies   PERTINENT MEDICATIONS:  Outpatient Encounter Medications as of 09/27/2019  Medication Sig  . amLODipine (NORVASC) 10 MG tablet Take 1 tablet (10 mg total) by mouth daily.  Marland Kitchen atorvastatin (LIPITOR) 20 MG tablet Take 1 tablet (20 mg total) by mouth daily.  Marland Kitchen azithromycin (ZITHROMAX Z-PAK) 250 MG tablet Take 2 tablets (500 mg) on  Day 1,  followed by 1 tablet (250 mg) once daily on Days 2 through 5.  . cyclobenzaprine (FLEXERIL) 10 MG tablet TAKE 1 TABLET BY MOUTH THREE TIMES A DAY AS NEEDED FOR MUSCLE SPASMS  . HYDROcodone-acetaminophen (NORCO) 10-325 MG tablet Take 1 tablet by mouth every 8 (eight)  hours as needed.  Marland Kitchen ibuprofen (ADVIL) 200 MG tablet Take 600 mg by mouth every 6 (six) hours as needed for pain or fever.   . Isavuconazonium Sulfate 186 MG CAPS Take 1 capsule (186 mg total) by mouth as directed. Load with 2 capsules every 8 hours x 6 doses, then 2 capsules daily the following day (Patient not taking: Reported on 07/24/2019)  . pentoxifylline (TRENTAL) 400 MG CR tablet Take 400mg  tab daily x 1 week, then 400mg  BID; take with food. Upset stomach is rare on this med, but stop if you have upset stomach.  . predniSONE (STERAPRED UNI-PAK 21 TAB) 10 MG (21) TBPK tablet Take 5 tabs (50mg ) PO on day 1, then 4  tabs on day 2, 3 tabs on day 3, 2 tabs on day 4, and 1 tab on day 5  . sodium fluoride (PREVIDENT 5000 PLUS) 1.1 % CREA dental cream Apply to tooth brush. Brush teeth for 2 minutes. Spit out excess. DO NOT rinse afterwards. Repeat nightly. (Patient not taking: Reported on 08/14/2019)  . vitamin E 180 MG (400 UNITS) capsule Take 400 IU daily x 1 week, then 400 IU BID  . warfarin (COUMADIN) 10 MG tablet Take 1 tablet of 10 mg warfarin daily except on Mondays, Wednesdays and Fridays, take only 1/2 tablet (5 mg).   No facility-administered encounter medications on file as of 09/27/2019.      Gae Bihl Jenetta Downer, NP

## 2019-09-28 NOTE — Patient Instructions (Addendum)
Pathologist)    SWALLOWING EXERCISES Do these until September 20, then 2 times per week afterwards  1. Effortful Swallows - Press your tongue against the roof of your mouth for 3 seconds, then squeeze          the muscles in your neck while you swallow your saliva or a sip of water - Repeat 10-15 times, 2 times a day, and use whenever you eat or drink  2. Masako Swallow - swallow with your tongue sticking out - Stick tongue out past your teeth and gently bite tongue with your teeth - Swallow, while holding your tongue with your teeth - Repeat 10-15 times, 2 times a day *use a wet spoon if your mouth gets dry*  3. Mendelsohn Maneuver - "half swallow" exercise - Start to swallow, and keep your Adam's apple up by squeezing hard with the            muscles of the throat - Hold the squeeze for 5-7 seconds and then relax - Repeat 10-15 times, 2 times a day *use a wet spoon if your mouth gets dry*              4.  "Super swallow"             - take a breath             - bear down             - swallow and cough             - repeat ten times

## 2019-09-29 ENCOUNTER — Telehealth: Payer: Self-pay

## 2019-09-29 NOTE — Telephone Encounter (Signed)
At the direction of Amy NP, phone call placed to patient to ensure that they were able to pick up medications from the pharmacy. VM left

## 2019-09-29 NOTE — Therapy (Signed)
[facility] [address] [place], [place], [address] Phone: [phone]   Fax:  [phone]  Speech Language Pathology Treatment  Patient Details  Name: [name] [name] MRN: [id number] Date of Birth: 18-Jan-1961 Referring Provider (SLP): Eppie Gibson MD   Encounter Date: 09/28/2019  End of Session - 09/29/19 0936    Visit Number  2    Number of Visits  7    Date for SLP Re-Evaluation  12/01/19    Authorization Type  MEDICAID    SLP Start Time  [id number]    SLP Stop Time   [id number]    SLP Time Calculation (min)  43 min    Activity Tolerance  Patient tolerated treatment well       Past Medical History:  Diagnosis Date  . 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 Benign essential HTN 01/04/2012  . Cancer ([facility])    tonsilar  . Chronic anticoagulation 01/02/2013  . Dermatofibroma of forearm 01/02/2013   Left side  . Hyperlipidemia, mixed 01/04/2012  . 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 Primary hypercoagulable state ([facility]) 10/01/2014  . Sinus bradycardia, chronic 01/04/2012  . Superficial thrombosis of lower extremity 05/02/2012    Past Surgical History:  Procedure Laterality Date  . DIRECT LARYNGOSCOPY Left 10/19/2018   Procedure: DIRECT LARYNGOSCOPY;  Surgeon: Leta Baptist, MD;  Location: [facility];  Service: ENT;  Laterality: Left;  . IR IMAGING GUIDED PORT INSERTION  11/04/2018  . IR REMOVAL TUN ACCESS W/ PORT W/O FL MOD SED  05/01/2019  . TONSILLECTOMY Left 10/19/2018   Procedure: BIOPSY OF LEFT TONSIL;  Surgeon: Leta Baptist, MD;  Location: [facility];  Service: ENT;  Laterality: Left;    There were no vitals filed for this visit.  Subjective Assessment - 09/28/19 1543    Subjective  Pt reports the appointment at [facility] next  week but is unsure why - "some sort of biopsy, I think. But I just had one of those. It was negative."    Currently in Pain?  No/denies            ADULT SLP TREATMENT - 09/29/19 0001      General Information   Behavior/Cognition  Alert;Cooperative;Pleasant mood    HPI  Strangely, pt unable to state with any certainty about what his upcoming medical appointments are for. See "S"      Treatment Provided   Treatment provided  Dysphagia      Dysphagia Treatment   Other treatment/comments  Clearing throat occasionally prior to POs today. Pt does report xerostomia, thrush, and thick mucous ("I havne't been able to really get anything out since December" pt stated), and in the AMs mucous is thicker and even harder to clear. "I think my main problem is I gotta get this (thrush) out of my mouth." Pt diet primarily liquids and tells LSP he might cough if he takes "a big gulp" but smaller bolus sizes result in no coughing. Pt was provided water today and immediate throat clearing, consistently, without any hydrophonia ("wet voice"). Pt shares with SLP he has a "Spit cup" at home in which he expectorates during the day. SLP STRONGLY cautioned pt to attempt a hard swallow to clear secretions/mucous x2-3 first, prior to expectorating or using suction machine. Pt reports this  consistent throat clearing heard today after POs is uncommon - pt feels like throat clearing is more due to loosening thickened mucous by multiple single sips of water and 2 bites of applesauce with the cued hard swallows than any airway difficulty with POs presented. Pt does in fact, not appear to have any other accompanying signs of airway difficulty such as watery eyes, "full" sounding voice (voice remains very "dry" sounding), hydrophonia, or changes in respiration. SLP re-educated pt on HEP procedure and he req'd max/total A consistently. Pt told rationale back to SLP and he was strongly encouraged to complete HEP BID to  alleviate/reduce danger of muscle fibrosis. Already, pt is at risk due to non-completion of the HEP.       Assessment / Recommendations / Plan   Plan  Continue with current plan of care      Dysphagia Recommendations   Diet recommendations  Dysphagia 1 (puree);Thin liquid   diet as tolerated   Liquids provided via  Cup;Straw   straw if small sips   Medication Administration  --   as tolerated   Compensations  Small sips/bites;Follow solids with liquid;Effortful swallow         SLP Short Term Goals - 09/29/19 0937      SLP SHORT TERM GOAL #1   Title  Pt will complete HEP with rare min A over two sessions    Baseline  total A    Time  2   all STGs renewed on 09-28-19   Period  --   visits, for all STGs   Status  On-going      SLP SHORT TERM GOAL #2   Title  Pt will demo knowledge of rationale for HEP completion    Baseline  total A    Time  2    Period  --   vistis   Status  On-going      SLP SHORT TERM GOAL #3   Title  Pt will tell SLP how a food journal can hasten/facilitate return to more normalized diet    Baseline  not provided yet    Time  2    Period  --   visits   Status  On-going      SLP SHORT TERM GOAL #4   Title  pt will tell SLP 3 overt s/sx aspiration PNA    Time  2    Status  New       SLP Long Term Goals - 09/29/19 1037      SLP LONG TERM GOAL #1   Title  Pt will complete HEP with modified independence over 3 visits    Baseline  total A    Time  4   all LTGs renewed on 09-28-19   Period  --   visits   Status  On-going      SLP LONG TERM GOAL #2   Title  Pt will tell SLP when to decr frequency of HEP    Baseline  total A    Time  4    Period  --   visits   Status  On-going       Plan - 09/29/19 0934    Clinical Impression Statement  Pt presents today with what appears to be [facility] swllowing ability with puree and water with small bites/sips and effortful swallows. No overt s/sx aspiration PNA reported or observed today. See 'other  comments" for more details. At this time I do not think pt requires objective  swallow eval (modified barium swallow). Strangely pt appears rahter unaware of reason/s for upcoming appointments surrounding his cancer care. Data suggests that WNL swallowing is threatened by muscle fibrosis that will likely develop after rad/chemorad is completed. Therefore, skilled ST would be beneficial to the pt in order to regularly assess pt's safety with POs and/or need for instrumental swallow assessment, as well as to assess proper completion of HEP.    Speech Therapy Frequency  --   once approx every four weeks   Duration  --   6 visits/7 total visits   Treatment/Interventions  Aspiration precaution training;Pharyngeal strengthening exercises;Diet toleration management by SLP;Trials of upgraded texture/liquids;Cueing hierarchy;Patient/family education;SLP instruction and feedback;Environmental controls;Compensatory strategies    Potential to Achieve Goals  Good    Potential Considerations  Cooperation/participation level;Ability to learn/carryover information    SLP Home Exercise Plan  provided today    Consulted and Agree with Plan of Care  Patient       Patient will benefit from skilled therapeutic intervention in order to improve the following deficits and impairments:   Dysphagia, unspecified type - Plan: SLP plan of care cert/re-cert    Problem List Patient Active Problem List   Diagnosis Date Noted  . Hypokalemia 08/21/2019  . Warfarin anticoagulation 02/20/2019  . Hypercholesteremia 02/07/2019  . Recurrent deep vein thrombosis (DVT) ([place]) 02/06/2019  . Thoracic ascending aortic aneurysm ([place]) 02/06/2019  . Port-A-Cath in place 01/18/2019  . Mucositis due to chemotherapy 12/07/2018  . Hypomagnesemia 12/07/2018  . Thrush 12/07/2018  . Malignant neoplasm of tonsillar fossa ([place]) 10/17/2018  . 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 Aneurysm artery, popliteal  ([place]) 10/01/2014  . DVT, lower extremity, proximal ([place]) 05/24/2013  . Chronic anticoagulation 01/02/2013  . Superficial thrombosis of lower extremity 05/02/2012  . Benign essential HTN 01/04/2012  . Hyperlipidemia, mixed 01/04/2012    [facility] ,[place], [place]  09/29/2019, 11:25 AM  [facility] [address] [place], [place], [address] Phone: [phone]   Fax:  [phone]   Name: [name] [name] MRN: [id number] Date of Birth: 01/29/1961

## 2019-10-02 DIAGNOSIS — K1329 Other disturbances of oral epithelium, including tongue: Secondary | ICD-10-CM | POA: Diagnosis not present

## 2019-10-02 DIAGNOSIS — Z86711 Personal history of pulmonary embolism: Secondary | ICD-10-CM | POA: Diagnosis not present

## 2019-10-02 DIAGNOSIS — Z86718 Personal history of other venous thrombosis and embolism: Secondary | ICD-10-CM | POA: Diagnosis not present

## 2019-10-02 DIAGNOSIS — C099 Malignant neoplasm of tonsil, unspecified: Secondary | ICD-10-CM | POA: Diagnosis not present

## 2019-10-02 DIAGNOSIS — J358 Other chronic diseases of tonsils and adenoids: Secondary | ICD-10-CM | POA: Diagnosis not present

## 2019-10-02 DIAGNOSIS — M8738 Other secondary osteonecrosis, other site: Secondary | ICD-10-CM | POA: Diagnosis not present

## 2019-10-02 DIAGNOSIS — Z87891 Personal history of nicotine dependence: Secondary | ICD-10-CM | POA: Diagnosis not present

## 2019-10-02 DIAGNOSIS — Y842 Radiological procedure and radiotherapy as the cause of abnormal reaction of the patient, or of later complication, without mention of misadventure at the time of the procedure: Secondary | ICD-10-CM | POA: Diagnosis not present

## 2019-10-02 DIAGNOSIS — Z85818 Personal history of malignant neoplasm of other sites of lip, oral cavity, and pharynx: Secondary | ICD-10-CM | POA: Diagnosis not present

## 2019-10-02 DIAGNOSIS — C024 Malignant neoplasm of lingual tonsil: Secondary | ICD-10-CM | POA: Diagnosis not present

## 2019-10-02 DIAGNOSIS — Z9221 Personal history of antineoplastic chemotherapy: Secondary | ICD-10-CM | POA: Diagnosis not present

## 2019-10-02 DIAGNOSIS — K148 Other diseases of tongue: Secondary | ICD-10-CM | POA: Diagnosis not present

## 2019-10-02 DIAGNOSIS — Z7901 Long term (current) use of anticoagulants: Secondary | ICD-10-CM | POA: Diagnosis not present

## 2019-10-02 DIAGNOSIS — Z923 Personal history of irradiation: Secondary | ICD-10-CM | POA: Diagnosis not present

## 2019-10-03 ENCOUNTER — Telehealth: Payer: Self-pay | Admitting: Adult Health Nurse Practitioner

## 2019-10-03 ENCOUNTER — Telehealth: Payer: Self-pay

## 2019-10-03 DIAGNOSIS — Z515 Encounter for palliative care: Secondary | ICD-10-CM | POA: Diagnosis not present

## 2019-10-03 DIAGNOSIS — C090 Malignant neoplasm of tonsillar fossa: Secondary | ICD-10-CM | POA: Diagnosis not present

## 2019-10-03 DIAGNOSIS — C024 Malignant neoplasm of lingual tonsil: Secondary | ICD-10-CM | POA: Diagnosis not present

## 2019-10-03 NOTE — Telephone Encounter (Signed)
Called to check if patient received medications prescribed left week and how he was doing on them.  Left VM with reason for call and call back info Joy Reiger K. Olena Heckle NP

## 2019-10-03 NOTE — Telephone Encounter (Signed)
Patient's SO, [name], returned call.  Discussed that he was able to get gabapentin and glycopyrrolate that was prescribed last week.  States that the gabapentin is helping with the nerve pain and he is getting relief taking it as needed.  He has not noticed a difference with the glycopyrrolate.  Have suggested using it every 8 hours for a few days to see if this will help initially and then try PRN.  States that he is having a lot of throat pain due to the radiation damage.   Has lidocaine mouth wash but does not like to take this as the numbing effect makes it hard to swallow and he already has difficulty swallowing and is an aspiration risk.  He has hydrocodone but this does not give much relief.  States that he uses ibuprofen a lot.  Concerned that the ibuprofen will cause GI bleeding with him taking it on a consistent basis. Morphine has not worked in the past. Have reached out to Dr. Maylon Peppers to see if possibly changing this to oxycodone or nucynta. Thaison Kolodziejski K. Olena Heckle NP

## 2019-10-04 ENCOUNTER — Ambulatory Visit (HOSPITAL_COMMUNITY): Payer: Medicaid Other

## 2019-10-04 ENCOUNTER — Telehealth: Payer: Self-pay | Admitting: Adult Health Nurse Practitioner

## 2019-10-04 NOTE — Telephone Encounter (Signed)
Called to let patient know that Dr. Maylon Peppers okay with me ordering oxycodone for his pain.  Let SO know that I sent escript to [facility] in [place].  Sent in 7 day supply for oxycodone 5 mg every 8 hours as needed for pain.  Will reevaluate for effectiveness and can order more when needed if getting relief with it. Amy K. Olena Heckle NP

## 2019-10-05 DIAGNOSIS — J358 Other chronic diseases of tonsils and adenoids: Secondary | ICD-10-CM | POA: Insufficient documentation

## 2019-10-05 NOTE — Telephone Encounter (Signed)
Encounter opened in error

## 2019-10-09 ENCOUNTER — Telehealth: Payer: Self-pay | Admitting: *Deleted

## 2019-10-09 ENCOUNTER — Ambulatory Visit: Payer: Medicaid Other

## 2019-10-09 ENCOUNTER — Ambulatory Visit (INDEPENDENT_AMBULATORY_CARE_PROVIDER_SITE_OTHER): Payer: Medicaid Other | Admitting: Pharmacist

## 2019-10-09 ENCOUNTER — Other Ambulatory Visit: Payer: Self-pay

## 2019-10-09 DIAGNOSIS — Z7901 Long term (current) use of anticoagulants: Secondary | ICD-10-CM | POA: Diagnosis not present

## 2019-10-09 DIAGNOSIS — L599 Disorder of the skin and subcutaneous tissue related to radiation, unspecified: Secondary | ICD-10-CM | POA: Diagnosis not present

## 2019-10-09 DIAGNOSIS — I2782 Chronic pulmonary embolism: Secondary | ICD-10-CM

## 2019-10-09 DIAGNOSIS — I890 Lymphedema, not elsewhere classified: Secondary | ICD-10-CM

## 2019-10-09 DIAGNOSIS — R1310 Dysphagia, unspecified: Secondary | ICD-10-CM | POA: Diagnosis not present

## 2019-10-09 DIAGNOSIS — R293 Abnormal posture: Secondary | ICD-10-CM | POA: Diagnosis not present

## 2019-10-09 DIAGNOSIS — Z5181 Encounter for therapeutic drug level monitoring: Secondary | ICD-10-CM | POA: Diagnosis not present

## 2019-10-09 LAB — POCT INR: INR: 1.5 — AB (ref 2.0–3.0)

## 2019-10-09 MED ORDER — WARFARIN SODIUM 10 MG PO TABS: ORAL_TABLET | ORAL | 2 refills | Status: DC

## 2019-10-09 NOTE — Patient Instructions (Signed)
Patient instructed to take medications as defined in the Anti-coagulation Track section of this encounter.  Patient instructed to take today's dose.  Patient instructed to take one (1) tablet of your 10mg -white warfarin tablets on Mondays, Tuesdays,  Wednesdays and Fridays. All OTHER days--take only one-half (1/2) of your 10mg -white warfarin tablets.  Patient verbalized understanding of these instructions.

## 2019-10-09 NOTE — Therapy (Signed)
[place], [place], [address] Phone: [phone]   Fax:  [phone]  Physical Therapy Treatment  Patient Details  Name: PARRIS DIFALCO MRN: [id number] Date of Birth: Jul 14, 1961 Referring Provider (PT): Reita May Date: 10/09/2019  PT End of Session - 10/09/19 1511    Visit Number  17    Number of Visits  24    Date for PT Re-Evaluation  11/03/19    Authorization Type  pending medicaid approval- 3 visits per 30 day auth; 09/28/19-10/25/19 for 8 visits    Authorization - Visit Number  1    Authorization - Number of Visits  8    PT Start Time  [id number]    PT Stop Time  1502    PT Time Calculation (min)  55 min    Activity Tolerance  Patient tolerated treatment well    Behavior During Therapy  [facility] for tasks assessed/performed       Past Medical History:  Diagnosis Date  . 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 Benign essential HTN 01/04/2012  . Cancer ([place])    tonsilar  . Chronic anticoagulation 01/02/2013  . Dermatofibroma of forearm 01/02/2013   Left side  . Hyperlipidemia, mixed 01/04/2012  . 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 Primary hypercoagulable state ([place]) 10/01/2014  . Sinus bradycardia, chronic 01/04/2012  . Superficial thrombosis of lower extremity 05/02/2012    Past Surgical History:  Procedure Laterality Date  . DIRECT LARYNGOSCOPY Left 10/19/2018   Procedure: DIRECT LARYNGOSCOPY;  Surgeon: Leta Baptist, MD;  Location: [place];  Service: ENT;  Laterality: Left;  . IR IMAGING GUIDED PORT INSERTION  11/04/2018  . IR REMOVAL TUN ACCESS W/ PORT W/O FL MOD SED  05/01/2019  . TONSILLECTOMY Left 10/19/2018   Procedure: BIOPSY OF LEFT TONSIL;  Surgeon: Leta Baptist, MD;  Location: [place];   Service: ENT;  Laterality: Left;    There were no vitals filed for this visit.  Subjective Assessment - 10/09/19 1411    Subjective  I feel about the same since I was here last, though I did feel some better after the last session.    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  Yes    Pain Score  3     Pain Location  Neck    Pain Orientation  Left    Pain Descriptors / Indicators  Aching   sharp when swallowing   Pain Type  Chronic pain    Pain Onset  More than a month ago    Pain Frequency  Constant    Aggravating Factors   swallowing    Pain Relieving Factors  therapy helps for a time and wearing garment and chip pack                       [facility] Adult PT Treatment/Exercise - 10/09/19 0001      Manual Therapy   Joint Mobilization   internally to TMJ with distraction and oscillations with gloved thumb on  back teeth , also gently side to side    Soft tissue mobilization  to tight tissue at left side of neck with prolonged pressure at trigger points     Myofascial Release  to left side of neck and ear pulling 2x, 37mins each      Manual Lymphatic Drainage (MLD)  In Supine with HOB elevated: Short and long neck, bil shoulder collectors, bil axillary nodes, 5 diaphragmatic breaths, anterior throat and submental nodes then retraced all steps ending with axillary nodes.                   PT Long Term Goals - 09/26/19 1742      PT LONG TERM GOAL #1   Title  Pt will be able to independently manage lymphedema through self MLD and compression garments    Baseline  pt needs review of how to properly use compression garments and flexitouch    Time  4    Period  Weeks    Status  On-going      PT LONG TERM GOAL #2   Title  Pt will receive appropriate  compression garments for long term management of lymphedema    Status  Achieved      PT LONG TERM GOAL #3   Title  Pt will reduce swelling at 8 cm superior to sternal notch by 2 cm to decrease risk of swelling.    Baseline  50.2 cm, 04/25/19- 50.4 but rest of neck demonstrates around 1.5 cm reduction, 06/01/19: 49.1; 42.5 cm - 07/03/19  40 on 09/26/2019    Status  Achieved      PT LONG TERM GOAL #4   Title  Pt to demonstrate 30 mm of jaw opening to allow pt to eat food.    Baseline  2mm- pt unable to eat, on 3/9/202115  mm only able to eat soup    Time  4    Period  Weeks    Status  On-going      PT LONG TERM GOAL #5   Title  Pt to demonstrate 8 cm of bilateral lateral jaw excursion to allow pt to return to PLOF    Baseline  R 53mm, L 61mm  on 09/26/3019  R 7 mm, L 10 mm    Time  4    Period  Weeks    Status  On-going      PT LONG TERM GOAL #6   Title  Pt will repot a 50% decrease in jaw pain to allow pt to return to prior level of function and be able to eat.    Time  4    Period  Weeks    Status  Achieved            Plan - 10/09/19 1802    Clinical Impression Statement  Pt returns today continuing to report myofascial tightness at Lt lateral neck and soreness/tenderness at Lt posterior mandible. Also still struggling with opening mouth reporting past few weeks he feels his progress with this has plateaued. Pt asked if okay to use his TENS unit along Lt jaw and lateral neck. After consulting with Leone Payor, PT,  agree his skin and sensation healed enough that should be okay but pt needs to be mindful of pulling off electrodes slowy and gently and to not increase output much. Educated him best time to use this would be when he's stretching with his tongue depressors. Pt verbalized understanding all. Focused on manual therapy addressing  defecits noted.    Personal Factors and Comorbidities  Comorbidity 1    Comorbidities  history of multiple DVT and PE    Stability/Clinical Decision  Making  Evolving/Moderate complexity    Rehab Potential  Good    PT Frequency  Other (comment)    PT Duration  4 weeks    PT Treatment/Interventions  ADLs/Self Care Home Management;Therapeutic exercise;Manual lymph drainage;Manual techniques;Compression bandaging;Taping;Therapeutic activities;Patient/family education;Passive range of motion;Joint Manipulations    PT Next Visit Plan  myofascial relase to head and neck,  Jaw massage and mobilizaion,  MLD to head and neck, try tongue to roof of mouth, jaw retraction/protraction and cervical retraction.    PT Home Exercise Plan  wear head and neck garment, self MLD, neck and jaw ROM exercises trying TENS with this    Consulted and Agree with Plan of Care  Patient       Patient will benefit from skilled therapeutic intervention in order to improve the following deficits and impairments:  Postural dysfunction, Decreased strength, Increased edema, Decreased knowledge of precautions, Pain, Increased fascial restricitons, Decreased range of motion, Increased muscle spasms  Visit Diagnosis: Disorder of the skin and subcutaneous tissue related to radiation, unspecified  Lymphedema, not elsewhere classified  Abnormal posture     Problem List Patient Active Problem List   Diagnosis Date Noted  . Hypokalemia 08/21/2019  . Warfarin anticoagulation 02/20/2019  . Hypercholesteremia 02/07/2019  . Recurrent deep vein thrombosis (DVT) ([place]) 02/06/2019  . Thoracic ascending aortic aneurysm ([place]) 02/06/2019  . Port-A-Cath in place 01/18/2019  . Mucositis due to chemotherapy 12/07/2018  . Hypomagnesemia 12/07/2018  . Thrush 12/07/2018  . Malignant neoplasm of tonsillar fossa ([place]) 10/17/2018  . 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 DVT, lower extremity, proximal ([place]) 05/24/2013  . Chronic anticoagulation 01/02/2013  . Superficial thrombosis of lower extremity 05/02/2012   . Benign essential HTN 01/04/2012  . Hyperlipidemia, mixed 01/04/2012    Otelia Limes, PTA 10/09/2019, 6:11 PM  [place], [place], [address] Phone: [phone]   Fax:  [phone]  Name: DACARI HOFLAND MRN: [id number] Date of Birth: January 12, 1961

## 2019-10-09 NOTE — Progress Notes (Signed)
Anticoagulation Management [name] [name] is a 59 y.o. male who reports to the clinic for monitoring of warfarin treatment.    Indication: PE, History of (resolved); Long term current use of warfarin/anticoagulant.  Duration: indefinite Supervising physician: [facility] Visit History: Patient does not report signs/symptoms of bleeding or thromboembolism  Other recent changes: YES has  Diet change, medications, lifestyle changes except as noted in patient findings. YES. Patient states he has started drinking 2-3 containers of "Boost" that contains 30 mcg of Vitamin K1 per serving. Anticoagulation Episode Summary    Current INR goal:  2.5-3.5  TTR:  33.0 % (8.3 y)  Next INR check:  10/23/2019  INR from last check:  1.5 (10/09/2019)  Weekly max warfarin dose:    Target end date:    INR check location:    Preferred lab:    Send INR reminders to:  [facility]   Indications   Pulmonary embolism (HCC) (Resolved) [I26.99]       Comments:        Anticoagulation Care Providers    Provider Role Specialty Phone number   Annia Belt, MD Referring Oncology [phone]      No Known Allergies  Current Outpatient Medications:  .  amLODipine (NORVASC) 10 MG tablet, Take 1 tablet (10 mg total) by mouth daily., Disp: 90 tablet, Rfl: 2 .  HYDROcodone-acetaminophen (NORCO) 10-325 MG tablet, Take 1 tablet by mouth every 8 (eight) hours as needed., Disp: 40 tablet, Rfl: 0 .  ibuprofen (ADVIL) 200 MG tablet, Take 600 mg by mouth every 6 (six) hours as needed for pain or fever. , Disp: , Rfl:  .  pentoxifylline (TRENTAL) 400 MG CR tablet, Take 400mg  tab daily x 1 week, then 400mg  BID; take with food. Upset stomach is rare on this med, but stop if you have upset stomach., Disp: 60 tablet, Rfl: 5 .  vitamin E 180 MG (400 UNITS) capsule, Take 400 IU daily x 1 week, then 400 IU BID, Disp: 60 capsule, Rfl: 5 .  warfarin (COUMADIN) 10 MG tablet, Take 1 tablet of 10  mg warfarin daily except on Sundays, Thursdays and Saturdays, take only 1/2 tablet (5 mg)., Disp: 72 tablet, Rfl: 2 .  atorvastatin (LIPITOR) 20 MG tablet, Take 1 tablet (20 mg total) by mouth daily. (Patient not taking: Reported on 10/09/2019), Disp: 30 tablet, Rfl: 11 .  cyclobenzaprine (FLEXERIL) 10 MG tablet, TAKE 1 TABLET BY MOUTH THREE TIMES A DAY AS NEEDED FOR MUSCLE SPASMS (Patient not taking: Reported on 10/09/2019), Disp: 30 tablet, Rfl: 0 .  Isavuconazonium Sulfate 186 MG CAPS, Take 1 capsule (186 mg total) by mouth as directed. Load with 2 capsules every 8 hours x 6 doses, then 2 capsules daily the following day (Patient not taking: Reported on 07/24/2019), Disp: 36 capsule, Rfl: 0 .  predniSONE (STERAPRED UNI-PAK 21 TAB) 10 MG (21) TBPK tablet, Take 5 tabs (50mg ) PO on day 1, then 4 tabs on day 2, 3 tabs on day 3, 2 tabs on day 4, and 1 tab on day 5 (Patient not taking: Reported on 10/09/2019), Disp: 21 tablet, Rfl: 0 .  sodium fluoride (PREVIDENT 5000 PLUS) 1.1 % CREA dental cream, Apply to tooth brush. Brush teeth for 2 minutes. Spit out excess. DO NOT rinse afterwards. Repeat nightly. (Patient not taking: Reported on 08/14/2019), Disp: 1 Tube, Rfl: prn Past Medical History:  Diagnosis Date  . 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 Benign essential HTN 01/04/2012  . Cancer ([place])    tonsilar  . Chronic anticoagulation 01/02/2013  . Dermatofibroma of forearm 01/02/2013   Left side  . Hyperlipidemia, mixed 01/04/2012  . 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 Primary hypercoagulable state ([place]) 10/01/2014  . Sinus bradycardia, chronic 01/04/2012  . Superficial thrombosis of lower extremity 05/02/2012   Social History   Socioeconomic History  . Marital status: Divorced    Spouse name: Not on file  . Number of children:  2  . Years of education: Not on file  . Highest education level: Not on file  Occupational History  . Not on file  Tobacco Use  . Smoking status: Former Smoker    Packs/day: 0.50    Years: 30.00    Pack years: 15.00    Types: Cigarettes    Quit date: 10/19/2018    Years since quitting: 0.9  . Smokeless tobacco: Never Used  Substance and Sexual Activity  . Alcohol use: Yes    Alcohol/week: 13.0 - 14.0 standard drinks    Types: 12 Cans of beer, 1 - 2 Shots of liquor per week    Comment: 1-2 times per week.  . Drug use: No  . Sexual activity: Not on file  Other Topics Concern  . Not on file  Social History Narrative  . Not on file   Social Determinants of Health   Financial Resource Strain:   . Difficulty of Paying Living Expenses:   Food Insecurity:   . Worried About Charity fundraiser in the Last Year:   . Arboriculturist in the Last Year:   Transportation Needs: No Transportation Needs  . Lack of Transportation (Medical): No  . Lack of Transportation (Non-Medical): No  Physical Activity:   . Days of Exercise per Week:   . Minutes of Exercise per Session:   Stress:   . Feeling of Stress :   Social Connections:   . Frequency of Communication with Friends and Family:   . Frequency of Social Gatherings with Friends and Family:   . Attends Religious Services:   . Active Member of Clubs or Organizations:   . Attends Archivist Meetings:   Marland Kitchen Marital Status:    Family History  Problem Relation Age of Onset  . Stroke Father     ASSESSMENT Recent Results: The most recent result is correlated with 50 mg per week: Lab Results  Component Value Date   INR 1.5 (A) 10/09/2019   INR 1.3 (H) 09/24/2019   INR 4.5 (A) 08/14/2019   PROTIME 28.8 (H) 02/04/2015    Anticoagulation Dosing: Description   Take one (1) tablet of your 10mg -white warfarin tablets on Mondays, Tuesdays,  Wednesdays and Fridays. All OTHER days--take only one-half (1/2) of your 10mg -white  warfarin tablets.      INR today: Subtherapeutic  PLAN Weekly dose was increased by 10% to 55 mg per week  Patient Instructions  Patient instructed to take medications as defined in the Anti-coagulation Track section of this encounter.  Patient instructed to take today's dose.  Patient instructed to take one (1) tablet of your 10mg -white warfarin tablets on Mondays, Tuesdays,  Wednesdays and Fridays. All OTHER days--take only one-half (1/2) of your 10mg -white warfarin tablets.  Patient verbalized understanding of these instructions.    Patient advised to contact  clinic or seek medical attention if signs/symptoms of bleeding or thromboembolism occur.  Patient verbalized understanding by repeating back information and was advised to contact me if further medication-related questions arise. Patient was also provided an information handout.  Follow-up Return in 2 weeks (on 10/23/2019) for Follow up INR.  Pennie Banter, PharmD, CPP  15 minutes spent face-to-face with the patient during the encounter. 50% of time spent on education, including signs/sx bleeding and clotting, as well as food and drug interactions with warfarin. 50% of time was spent on fingerprick POC INR sample collection,processing, results determination, and documentation in http://www.kim.net/.

## 2019-10-09 NOTE — Telephone Encounter (Signed)
CALLED PATIENT TO INFORM OF TELEPHONE FU WITH DR. Isidore Moos ON 10-13-19 @ 2:40 PM, LVM FOR A RETURN CALL

## 2019-10-11 ENCOUNTER — Other Ambulatory Visit: Payer: Medicaid Other

## 2019-10-11 ENCOUNTER — Ambulatory Visit: Payer: Medicaid Other | Admitting: Hematology

## 2019-10-12 ENCOUNTER — Ambulatory Visit: Payer: Medicaid Other | Admitting: Physical Therapy

## 2019-10-12 ENCOUNTER — Other Ambulatory Visit: Payer: Self-pay

## 2019-10-12 ENCOUNTER — Encounter: Payer: Self-pay | Admitting: Physical Therapy

## 2019-10-12 DIAGNOSIS — I890 Lymphedema, not elsewhere classified: Secondary | ICD-10-CM | POA: Diagnosis not present

## 2019-10-12 DIAGNOSIS — R293 Abnormal posture: Secondary | ICD-10-CM | POA: Diagnosis not present

## 2019-10-12 DIAGNOSIS — L599 Disorder of the skin and subcutaneous tissue related to radiation, unspecified: Secondary | ICD-10-CM | POA: Diagnosis not present

## 2019-10-12 DIAGNOSIS — R1310 Dysphagia, unspecified: Secondary | ICD-10-CM | POA: Diagnosis not present

## 2019-10-12 NOTE — Therapy (Signed)
[place], [place], [address] Phone: [phone]   Fax:  [phone]  Physical Therapy Treatment  Patient Details  Name: [name] [name] MRN: [id number] Date of Birth: [birth year] Referring Provider (PT): Reita May Date: 10/12/2019  PT End of Session - 10/12/19 1615    Visit Number  18    Number of Visits  24    Date for PT Re-Evaluation  11/03/19    Authorization Type  pending medicaid approval- 3 visits per 30 day auth; 09/28/19-10/25/19 for 8 visits    Activity Tolerance  Patient tolerated treatment well    Behavior During Therapy  [facility] for tasks assessed/performed       Past Medical History:  Diagnosis Date  . 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 Benign essential HTN 01/04/2012  . Cancer ([place])    tonsilar  . Chronic anticoagulation 01/02/2013  . Dermatofibroma of forearm 01/02/2013   Left side  . Hyperlipidemia, mixed 01/04/2012  . 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 Primary hypercoagulable state ([place]) 10/01/2014  . Sinus bradycardia, chronic 01/04/2012  . Superficial thrombosis of lower extremity 05/02/2012    Past Surgical History:  Procedure Laterality Date  . DIRECT LARYNGOSCOPY Left 10/19/2018   Procedure: DIRECT LARYNGOSCOPY;  Surgeon: Leta Baptist, MD;  Location: [place];  Service: ENT;  Laterality: Left;  . IR IMAGING GUIDED PORT INSERTION  11/04/2018  . IR REMOVAL TUN ACCESS W/ PORT W/O FL MOD SED  05/01/2019  . TONSILLECTOMY Left 10/19/2018   Procedure: BIOPSY OF LEFT TONSIL;  Surgeon: Leta Baptist, MD;  Location: [place];  Service: ENT;  Laterality: Left;    There were no vitals filed for this visit.  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  Yes    Pain Score  3     Pain Location  Throat    Pain Orientation  Left                       [facility] Adult PT Treatment/Exercise - 10/12/19 0001      Exercises   Exercises  Neck;Shoulder      Neck Exercises: Seated   Other Seated Exercise  neck ROM encouraging good posture, pt mostly limited in extension     Other Seated Exercise  shoulder rolls       Shoulder Exercises: Standing   Row  Right;Left;10 reps;Theraband    Theraband Level (Shoulder Row)  Level 2 (Red)      Manual Therapy   Manual Therapy  Joint mobilization;Myofascial release;Passive ROM    Joint Mobilization   internally to TMJ with distraction and oscillations with gloved thumb on back teeth , also gently side to side    Soft tissue mobilization  to tight tissue at left side of neck with prolonged pressure at trigger points     Myofascial Release  to left side of neck  and ear pulling 2x, 64mins each      Manual Lymphatic Drainage (MLD)  In Supine with HOB elevated: Short and long neck, bil shoulder collectors, bil axillary nodes, 5 diaphragmatic breaths, anterior throat and submental nodes then retraced all steps ending with axillary nodes.     Passive ROM  with thumbs on both sides on lower teeth , passive stretch to mouth opening 5 reps x 5 sec. Pt with increased opening after stretch                   PT Long Term Goals - 09/26/19 1742      PT LONG TERM GOAL #1   Title  Pt will be able to independently manage lymphedema through self MLD and compression garments    Baseline  pt needs review of how to properly use compression garments and flexitouch    Time  4    Period  Weeks    Status  On-going      PT LONG TERM  GOAL #2   Title  Pt will receive appropriate compression garments for long term management of lymphedema    Status  Achieved      PT LONG TERM GOAL #3   Title  Pt will reduce swelling at 8 cm superior to sternal notch by 2 cm to decrease risk of swelling.    Baseline  50.2 cm, 04/25/19- 50.4 but rest of neck demonstrates around 1.5 cm reduction, 06/01/19: 49.1; 42.5 cm - 07/03/19  40 on 09/26/2019    Status  Achieved      PT LONG TERM GOAL #4   Title  Pt to demonstrate 30 mm of jaw opening to allow pt to eat food.    Baseline  35mm- pt unable to eat, on 3/9/202115  mm only able to eat soup    Time  4    Period  Weeks    Status  On-going      PT LONG TERM GOAL #5   Title  Pt to demonstrate 8 cm of bilateral lateral jaw excursion to allow pt to return to PLOF    Baseline  R 32mm, L 79mm  on 09/26/3019  R 7 mm, L 10 mm    Time  4    Period  Weeks    Status  On-going      PT LONG TERM GOAL #6   Title  Pt will repot a 50% decrease in jaw pain to allow pt to return to prior level of function and be able to eat.    Time  4    Period  Weeks    Status  Achieved            Plan - 10/12/19 1616    Clinical Impression Statement  Pt appeared to be a little better today and as able to participate in exercise program.  Encouraged pt to continue to do Flexitouch WITH chest piece and his mouth opening exericses frequently.  Also encouraged pt to walk for several minutes ( or dance with his girlfriend to 2 songs ;) [employer] called Medicaid worker to see if [employer] can get covered but had to leave a message    Personal Factors and Comorbidities  Comorbidity 1    Comorbidities  history of multiple DVT and PE    Stability/Clinical Decision Making  Evolving/Moderate complexity    PT Frequency  Other (comment)    PT Duration  4 weeks    PT Treatment/Interventions  ADLs/Self  Care Home Management;Therapeutic exercise;Manual lymph drainage;Manual techniques;Compression bandaging;Taping;Therapeutic  activities;Patient/family education;Passive range of motion;Joint Manipulations    PT Next Visit Plan  myofascial relase to head and neck,  Jaw massage and mobilizaion,  MLD to head and neck, try tongue to roof of mouth, jaw retraction/protraction and cervical retraction.    PT Home Exercise Plan  wear head and neck garment, self MLD, neck and jaw ROM exercises trying TENS with this       Patient will benefit from skilled therapeutic intervention in order to improve the following deficits and impairments:  Postural dysfunction, Decreased strength, Increased edema, Decreased knowledge of precautions, Pain, Increased fascial restricitons, Decreased range of motion, Increased muscle spasms  Visit Diagnosis: Disorder of the skin and subcutaneous tissue related to radiation, unspecified  Lymphedema, not elsewhere classified  Abnormal posture     Problem List Patient Active Problem List   Diagnosis Date Noted  . Hypokalemia 08/21/2019  . Warfarin anticoagulation 02/20/2019  . Hypercholesteremia 02/07/2019  . Recurrent deep vein thrombosis (DVT) ([place]) 02/06/2019  . Thoracic ascending aortic aneurysm ([place]) 02/06/2019  . Port-A-Cath in place 01/18/2019  . Mucositis due to chemotherapy 12/07/2018  . Hypomagnesemia 12/07/2018  . Thrush 12/07/2018  . Malignant neoplasm of tonsillar fossa ([place]) 10/17/2018  . 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 DVT, lower extremity, proximal ([place]) 05/24/2013  . Chronic anticoagulation 01/02/2013  . Superficial thrombosis of lower extremity 05/02/2012  . Benign essential HTN 01/04/2012  . Hyperlipidemia, mixed 01/04/2012   [name]  Norwood Levo 10/12/2019, 4:19 PM  [place], [place], [address] Phone: [phone]   Fax:  [phone]  Name: [name] [name] MRN:  [id number] Date of Birth: [birth year]

## 2019-10-13 ENCOUNTER — Ambulatory Visit
Admission: RE | Admit: 2019-10-13 | Discharge: 2019-10-13 | Disposition: A | Discharge status: Home / Self Care | Payer: Medicaid Other | Source: Ambulatory Visit | Attending: Radiation Oncology | Admitting: Radiation Oncology

## 2019-10-13 DIAGNOSIS — C090 Malignant neoplasm of tonsillar fossa: Secondary | ICD-10-CM

## 2019-10-13 NOTE — Progress Notes (Signed)
Radiation Oncology         [phone] ________________________________  Name: [name] [name] MRN: [id number]  Date: 10/13/2019  DOB: [birth year]  Follow-Up Visit Note by telephone.  The patient opted for telemedicine to maximize safety during the pandemic.  MyChart video was not obtainable.  CC: [name], MD  [name], MD  Diagnosis and Prior Radiotherapy:       ICD-10-CM   1. Malignant neoplasm of tonsillar fossa [facility])  C09.0     11/08/2018 - 12/28/2018: Left Tonsil / 70 Gy in 35 fractions Cancer Staging Malignant neoplasm of tonsillar fossa ([place]) Staging form: Pharynx - HPV-Mediated Oropharynx, AJCC 8th Edition - Clinical: Stage I (cT2, cN1, cM0, p16+) - Signed by Eppie Gibson, MD on 10/21/2018   CHIEF COMPLAINT:  Here for follow-up and surveillance of tonsillar cancer  Narrative: I had a discussion today by phone with the patient and his significant other.  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 "Paucicellular necrotic debris with abundant bacterial organisms"  He reports that he has significantly cut down on smoking.  He does continue to smoke a couple cigs/weeks.  He is taking Vit E/Trental - well tolerated.  He is met with physical therapy.  He needs a Therabite device due to significant trismus.  He needs a prescription for this.  ALLERGIES:  has No Known Allergies.  Meds: Current Outpatient Medications  Medication Sig Dispense Refill  . amLODipine (NORVASC) 10 MG tablet Take 1 tablet (10 mg total) by mouth daily. 90 tablet 2  . atorvastatin (LIPITOR) 20 MG tablet Take 1 tablet (20 mg total) by mouth daily. (Patient not taking: Reported on 10/09/2019) 30 tablet 11  . cyclobenzaprine (FLEXERIL) 10 MG tablet TAKE 1 TABLET BY MOUTH THREE TIMES A DAY AS NEEDED FOR MUSCLE SPASMS (Patient not taking: Reported on 10/09/2019) 30 tablet 0  . HYDROcodone-acetaminophen (NORCO) 10-325 MG tablet Take 1 tablet by mouth every 8 (eight) hours as  needed. 40 tablet 0  . ibuprofen (ADVIL) 200 MG tablet Take 600 mg by mouth every 6 (six) hours as needed for pain or fever.     . Isavuconazonium Sulfate 186 MG CAPS Take 1 capsule (186 mg total) by mouth as directed. Load with 2 capsules every 8 hours x 6 doses, then 2 capsules daily the following day (Patient not taking: Reported on 07/24/2019) 36 capsule 0  . pentoxifylline (TRENTAL) 400 MG CR tablet Take 423m tab daily x 1 week, then 4066mBID; take with food. Upset stomach is rare on this med, but stop if you have upset stomach. 60 tablet 5  . predniSONE (STERAPRED UNI-PAK 21 TAB) 10 MG (21) TBPK tablet Take 5 tabs (5028mPO on day 1, then 4 tabs on day 2, 3 tabs on day 3, 2 tabs on day 4, and 1 tab on day 5 (Patient not taking: Reported on 10/09/2019) 21 tablet 0  . sodium fluoride (PREVIDENT 5000 PLUS) 1.1 % CREA dental cream Apply to tooth brush. Brush teeth for 2 minutes. Spit out excess. DO NOT rinse afterwards. Repeat nightly. (Patient not taking: Reported on 08/14/2019) 1 Tube prn  . vitamin E 180 MG (400 UNITS) capsule Take 400 IU daily x 1 week, then 400 IU BID 60 capsule 5  . warfarin (COUMADIN) 10 MG tablet Take 1 tablet of 10 mg warfarin daily except on Sundays, Thursdays and Saturdays, take only 1/2 tablet (5 mg). 72 tablet 2   No current facility-administered medications for this encounter.  Physical Findings: The patient is in no acute distress. Patient is alert and oriented. Wt Readings from Last 3 Encounters:  09/24/19 205 lb (93 kg)  08/31/19 203 lb (92.1 kg)  08/21/19 208 lb (94.3 kg)    vitals were not taken for this visit. .   General: Alert and oriented, in no acute distress   Lab Findings: Lab Results  Component Value Date   WBC 11.0 (H) 09/24/2019   HGB 11.9 (L) 09/24/2019   HCT 35.9 (L) 09/24/2019   MCV 88.9 09/24/2019   PLT 312 09/24/2019    Lab Results  Component Value Date   TSH 3.584 08/31/2019    Radiographic Findings: DG Chest 2  View  Result Date: 09/24/2019 CLINICAL DATA:  Shortness of breath and chest congestion with productive cough. Thrush. Treated with radiation therapy and chemotherapy 1 year ago for tonsillar cancer. EXAM: CHEST - 2 VIEW COMPARISON:  10/15/2011 FINDINGS: Normal sized heart. Tortuous aorta. Stable left basilar pleural and parenchymal scarring. The lungs are hyperexpanded with bullous changes. Interval minimal patchy/linear density in the right upper lung zone. Unremarkable bones. IMPRESSION: 1. Interval minimal right upper lung zone atelectasis or pneumonia. 2. Stable changes of COPD. 3. Stable left basilar pleural and parenchymal scarring. Electronically Signed   By: Claudie Revering M.D.   On: 09/24/2019 11:21   CT Soft Tissue Neck W Contrast  Result Date: 09/24/2019 CLINICAL DATA:  Increased secretions EXAM: CT NECK WITH CONTRAST TECHNIQUE: Multidetector CT imaging of the neck was performed using the standard protocol following the bolus administration of intravenous contrast. CONTRAST:  64m OMNIPAQUE IOHEXOL 300 MG/ML  SOLN COMPARISON:  09/11/2019 FINDINGS: Pharynx and larynx: Stable appearance of diffuse pharyngeal soft tissue thickening and associated airway narrowing related to radiation therapy. Stable lobular soft tissue at the tongue base (series 2, image 42). Mucosal irregularity of the anterolateral left pharyngeal wall at this level extending caudally with adjacent intraluminal retained debris again noted. Unchanged nonspecific asymmetric left tonsillar region soft tissue thickening. Salivary glands: Stable indeterminate 6 mm left parotid lesion, which could reflect a node or primary parotid neoplasm. Parotid glands are otherwise unremarkable. Post radiation changes of the submandibular glands. Thyroid: Unremarkable. Lymph nodes: No new adenopathy. Vascular: Major neck vessels are patent. Limited intracranial: No abnormal enhancement. Visualized orbits: Unremarkable Mastoids and visualized paranasal  sinuses: Left maxillary sinus polypoid mucosal thickening. Mastoid air cells are clear. Skeleton: Stable mild degenerative changes. Upper chest: Increased bandlike opacity in the right upper lobe (series 6, image 48). Other: None. IMPRESSION: No substantial change since recent prior study of 09/11/2019 with persistent sequelae of radiation therapy and possible recurrent tumor. Increased bandlike opacity in the right upper lobe which could reflect scarring. Electronically Signed   By: PMacy MisM.D.   On: 09/24/2019 11:58    Impression/Plan:   This is a wonderful 59year-old gentleman with a history of oropharyngeal cancer status post chemoradiation.  He has had fullness in the tumor bed with residual hypermetabolic activity of unknown etiology.  Biopsy has been difficult but ultimately tissue has been obtained to confirm nonhealing tissue consistent with radiation necrosis.  I recommend he continue Trental and vitamin E.  I recommend a wound care consultation.  He may benefit from hyperbaric oxygen.  I will refer him to the wound care clinic.  I defer to them on whether antibiotics are also appropriate for tissue healing.  I have written note prescription for Therabite device and nursing will send this to the patient.  Continue physical therapy for significant trismus.  He has cut down on smoking but he does continue to smoke and knows that ultimately it is best that he stop smoking entirely.  Alcohol cessation also highly recommended.  He will continue to follow closely with otolaryngology and medical oncology.  I will see him back for follow-up in 57mo  This encounter was provided by telemedicine by telephone.  The patient opted for telemedicine to maximize safety during the pandemic.  MyChart video was not obtainable. The patient has given verbal consent for this type of encounter and has been advised to only accept a meeting of this type in a secure network environment. The attendants for  this meeting include SEppie Gibson and HGladstone Pih  During the encounter, [facility].  HALASTOR KNEALEwas located at home.   Encounter, on date of service, took a total of 25 minutes.  _____________________________________   SEppie Gibson MD  This document serves as a record of services personally performed by SEppie Gibson MD. It was created on her behalf by KWilburn Mylar a trained medical scribe. The creation of this record is based on the scribe's personal observations and the provider's statements to them. This document has been checked and approved by the attending provider.

## 2019-10-16 ENCOUNTER — Other Ambulatory Visit: Payer: Self-pay

## 2019-10-16 ENCOUNTER — Ambulatory Visit: Payer: Medicaid Other

## 2019-10-16 DIAGNOSIS — L599 Disorder of the skin and subcutaneous tissue related to radiation, unspecified: Secondary | ICD-10-CM | POA: Diagnosis not present

## 2019-10-16 DIAGNOSIS — I890 Lymphedema, not elsewhere classified: Secondary | ICD-10-CM

## 2019-10-16 DIAGNOSIS — R1310 Dysphagia, unspecified: Secondary | ICD-10-CM | POA: Diagnosis not present

## 2019-10-16 DIAGNOSIS — R293 Abnormal posture: Secondary | ICD-10-CM

## 2019-10-16 NOTE — Therapy (Signed)
[place], [place], [address] Phone: [phone]   Fax:  [phone]  Physical Therapy Treatment  Patient Details  Name: [name] [name] MRN: [id number] Date of Birth: [birth year] Referring Provider (PT): Reita May Date: 10/16/2019  PT End of Session - 10/16/19 1608    Visit Number  19    Number of Visits  24    Date for PT Re-Evaluation  11/03/19    Authorization Type  pending medicaid approval- 3 visits per 30 day auth; 09/28/19-10/25/19 for 8 visits    Authorization - Visit Number  2    Authorization - Number of Visits  8    PT Start Time  1605    PT Stop Time  1700    PT Time Calculation (min)  55 min    Activity Tolerance  Patient tolerated treatment well    Behavior During Therapy  [facility] for tasks assessed/performed       Past Medical History:  Diagnosis Date  . 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 Benign essential HTN 01/04/2012  . Cancer ([place])    tonsilar  . Chronic anticoagulation 01/02/2013  . Dermatofibroma of forearm 01/02/2013   Left side  . Hyperlipidemia, mixed 01/04/2012  . 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 Primary hypercoagulable state ([place]) 10/01/2014  . Sinus bradycardia, chronic 01/04/2012  . Superficial thrombosis of lower extremity 05/02/2012    Past Surgical History:  Procedure Laterality Date  . DIRECT LARYNGOSCOPY Left 10/19/2018   Procedure: DIRECT LARYNGOSCOPY;  Surgeon: Leta Baptist, MD;  Location: [place];  Service: ENT;  Laterality: Left;  . IR IMAGING GUIDED PORT INSERTION  11/04/2018  . IR REMOVAL TUN ACCESS W/ PORT W/O FL MOD SED  05/01/2019  . TONSILLECTOMY Left 10/19/2018   Procedure: BIOPSY OF LEFT TONSIL;  Surgeon: Leta Baptist, MD;  Location: [place];   Service: ENT;  Laterality: Left;    There were no vitals filed for this visit.  Subjective Assessment - 10/16/19 1606    Subjective  Pt states that he feels about the same.    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  Yes    Pain Score  2     Pain Location  Throat    Pain Orientation  Left    Pain Descriptors / Indicators  Aching    Pain Type  Chronic pain    Pain Onset  More than a month ago    Pain Frequency  Constant    Aggravating Factors   swallowing                       [facility] Adult PT Treatment/Exercise - 10/16/19 0001      Neck Exercises: Supine   Neck Retraction  5 reps    Neck Retraction Limitations  pt required increased time for education on how to perform neck retractions difficulty segmenting movement away from extension    Other Supine Exercise  Tongue pressed to the roof ot he mouth 10x 10 second  holds with improvement in mouth opening following exercise.     Other Supine Exercise  Isometric contract/relax of the pterygoid lateralis and digastric muscles with isometric opening 10 seconds followed by stretching into open by therapist 10 seconds 10x       Manual Therapy   Manual Therapy  Joint mobilization;Myofascial release;Soft tissue mobilization;Manual Lymphatic Drainage (MLD)    Joint Mobilization  inferior glide of the L side of the mandible 8x8 2 sets grade III    Soft tissue mobilization  Trigger point release through the L SCM and medial pterygoid and Bil masseter with L >R; minimal improvement by end of session.     Myofascial Release  alont the lateral Bil cervcial spine down to chest and into the buccal area and down into the distal shoulder with extra time spent on the L; decreased fluid in the submandibular fossa following myofascial  release.     Manual Lymphatic Drainage (MLD)  with head elevated ~60 degrees to promote lympatic flow short neck, worked Bil lateral cervical R/L from mandible to axilla, worked Horticulturist, commercial with stationary circles at the submandibular fossa, swimming in the terminus 2x              PT Education - 10/16/19 1706    Education Details  Pt will practice tongue to roof of mouth 10x 10 seconds at home every day. He will wake up perform flexitouch, wear chin strap and chip pack until lunch time.    Person(s) Educated  Patient    Methods  Explanation;Demonstration;Verbal cues    Comprehension  Verbalized understanding;Returned demonstration          PT Long Term Goals - 09/26/19 1742      PT LONG TERM GOAL #1   Title  Pt will be able to independently manage lymphedema through self MLD and compression garments    Baseline  pt needs review of how to properly use compression garments and flexitouch    Time  4    Period  Weeks    Status  On-going      PT LONG TERM GOAL #2   Title  Pt will receive appropriate compression garments for long term management of lymphedema    Status  Achieved      PT LONG TERM GOAL #3   Title  Pt will reduce swelling at 8 cm superior to sternal notch by 2 cm to decrease risk of swelling.    Baseline  50.2 cm, 04/25/19- 50.4 but rest of neck demonstrates around 1.5 cm reduction, 06/01/19: 49.1; 42.5 cm - 07/03/19  40 on 09/26/2019    Status  Achieved      PT LONG TERM GOAL #4   Title  Pt to demonstrate 30 mm of jaw opening to allow pt to eat food.    Baseline  55mm- pt unable to eat, on 3/9/202115  mm only able to eat soup    Time  4    Period  Weeks    Status  On-going      PT LONG TERM GOAL #5   Title  Pt to demonstrate 8 cm of bilateral lateral jaw excursion to allow pt to return to PLOF    Baseline  R 70mm, L 68mm  on 09/26/3019  R 7 mm, L 10 mm    Time  4    Period  Weeks    Status  On-going      PT LONG TERM GOAL #6   Title  Pt  will repot a 50%  decrease in jaw pain to allow pt to return to prior level of function and be able to eat.    Time  4    Period  Weeks    Status  Achieved            Plan - 10/16/19 1608    Clinical Impression Statement  Pt had significant improvement this session with depression of the mandible following tongue pressure on roof of mouth with mandibular depression, contract relax and joint mobilizations. Discussed how to decrease the burden of care and overwhelming feeling that comes with management at home including performing exercises intermittently throughout the day, using pump first thing in the morning and only wearing compression until lunch time. Pt will work on tongue pressure on roof of mouth at home. Pt had significant tightness in myofascial sheath along the lateral cervical spine into the anterior chest and up into the buccal area that improved significantly following myofascial release including decrease fluid in the submandibular fossa. Pt will benefit from continued POC at this time.    Personal Factors and Comorbidities  Comorbidity 1    Comorbidities  history of multiple DVT and PE    Rehab Potential  Good    PT Frequency  Other (comment)    PT Duration  4 weeks    PT Treatment/Interventions  ADLs/Self Care Home Management;Therapeutic exercise;Manual lymph drainage;Manual techniques;Compression bandaging;Taping;Therapeutic activities;Patient/family education;Passive range of motion;Joint Manipulations    PT Next Visit Plan  work on chin tuck and contract relax with masseter/medial pterygoid next session, continue with myofascial relase to head and neck,  Jaw massage and mobilization,  MLD to head and neck, try tongue to roof of mouth, jaw retraction/protraction and cervical retraction.    PT Home Exercise Plan  wear head and neck garment, self MLD, neck and jaw ROM exercises trying TENS with this    Consulted and Agree with Plan of Care  Patient       Patient will benefit from skilled  therapeutic intervention in order to improve the following deficits and impairments:  Postural dysfunction, Decreased strength, Increased edema, Decreased knowledge of precautions, Pain, Increased fascial restricitons, Decreased range of motion, Increased muscle spasms  Visit Diagnosis: Disorder of the skin and subcutaneous tissue related to radiation, unspecified  Lymphedema, not elsewhere classified  Abnormal posture     Problem List Patient Active Problem List   Diagnosis Date Noted  . Hypokalemia 08/21/2019  . Warfarin anticoagulation 02/20/2019  . Hypercholesteremia 02/07/2019  . Recurrent deep vein thrombosis (DVT) ([place]) 02/06/2019  . Thoracic ascending aortic aneurysm ([place]) 02/06/2019  . Port-A-Cath in place 01/18/2019  . Mucositis due to chemotherapy 12/07/2018  . Hypomagnesemia 12/07/2018  . Thrush 12/07/2018  . Malignant neoplasm of tonsillar fossa ([place]) 10/17/2018  . 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 DVT, lower extremity, proximal ([place]) 05/24/2013  . Chronic anticoagulation 01/02/2013  . Superficial thrombosis of lower extremity 05/02/2012  . Benign essential HTN 01/04/2012  . Hyperlipidemia, mixed 01/04/2012    [name] [name], PT 10/16/2019, 5:11 PM  [place], [place], [address] Phone: [phone]   Fax:  [phone]  Name: [name] [name] MRN: [id number] Date of Birth: 24-Jan-1961

## 2019-10-17 ENCOUNTER — Other Ambulatory Visit: Payer: Medicaid Other | Admitting: Adult Health Nurse Practitioner

## 2019-10-17 DIAGNOSIS — C090 Malignant neoplasm of tonsillar fossa: Secondary | ICD-10-CM

## 2019-10-17 DIAGNOSIS — Z515 Encounter for palliative care: Secondary | ICD-10-CM

## 2019-10-17 NOTE — Progress Notes (Signed)
[facility] Consult Note Telephone: [phone]  Fax: [phone]  PATIENT NAME: [name] [name] DOB: Jun 30, 1961 MRN: [id number]  PRIMARY CARE PROVIDER:   [name], MD  REFERRING PROVIDER:  [name], MD [address] [place],  [place] [address]  RESPONSIBLE PARTY:   self [place], [place] H: [phone]  C: [phone]    RECOMMENDATIONS and PLAN:  1.  Advanced care planning.  Full code.  Left MOST form for family to review together and will discuss more at future visit  2.  Tonsillar cancer.  Patient's recent biopsy showed no recurrence of the cancer.  Determined that he has radiation necrosis causing his throat and jaw pain and that the sloughing of the skin in his throat is the cause of the secretions he is having. States that he is being referred to wound clinic to possibly start hyperbaric therapy to help with healing the radiation necrosis.  This has not been set up yet.  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  States that he is able to open it more and has been eating more solid food and still drinking Ensure.  Continue follow up and recommendations by oncology and wound clinic  3.  Secretions.  Is using a suction machine and tongue scrapper to help control the secretions. States that the glycopyrrolate helps a little bit. States that he is not having any difficulty coughing up secretions when needed.  Has not had any increased SOB or cough.  Continue current interventions  4. Pain.  Was started on gabapentin 100 mg up to TID and oxycodone 5 mg Q8 hrs PRN for his jaw pain.  States that this does give him good pain relief.  States taking up to 2 oxy a day and takes 1-2 of the gabapentin a day.  Continue current pain regimen.  Did refill oxycodone today.  Palliative will continue to monitor for symptom management/decline and make recommendations as needed.  Uncertain as to what  his schedule is going to look like once he starts hyperbaric therapy.  Will call back in 2-3 weeks to set up next appointment.     I spent 60 minutes providing this consultation,  from 3:00 to 4:00 including time with patient/family, chart review, provider coordination, and documentation. More than 50% of the time in this consultation was spent coordinating communication.   HISTORY OF PRESENT ILLNESS:  [name] [name] is a 59 y.o. year old male with multiple medical problems including including tonsillar cancer,HTN, HLD, h/o DVT and PE on coumadin. Palliative Care was asked to help address goals of care.   CODE STATUS: see above  PPS: 50% HOSPICE ELIGIBILITY/DIAGNOSIS: TBD  PHYSICAL EXAM:   General: NAD, frail appearing, thin Pulmonary: lung sounds clear; normal respiratory effort Extremities: no edema, no joint deformities Skin: no rashes on exposed skin Neurological: Weakness but otherwise nonfocal   PAST MEDICAL HISTORY:  Past Medical History:  Diagnosis Date  . 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 Benign essential HTN 01/04/2012  . Cancer ([place])    tonsilar  . Chronic anticoagulation 01/02/2013  . Dermatofibroma of forearm 01/02/2013   Left side  . Hyperlipidemia, mixed 01/04/2012  . 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 Primary hypercoagulable state ([place]) 10/01/2014  .  Sinus bradycardia, chronic 01/04/2012  . Superficial thrombosis of lower extremity 05/02/2012    SOCIAL HX:  Social History   Tobacco Use  . Smoking status: Former Smoker    Packs/day: 0.50    Years: 30.00    Pack years: 15.00    Types: Cigarettes    Quit date: 10/19/2018    Years since quitting: 0.9  . Smokeless tobacco: Never Used  Substance Use Topics  . Alcohol use: Yes    Alcohol/week: 13.0 - 14.0 standard drinks    Types:  12 Cans of beer, 1 - 2 Shots of liquor per week    Comment: 1-2 times per week.    ALLERGIES: No Known Allergies   PERTINENT MEDICATIONS:  Outpatient Encounter Medications as of 10/17/2019  Medication Sig  . amLODipine (NORVASC) 10 MG tablet Take 1 tablet (10 mg total) by mouth daily.  Marland Kitchen atorvastatin (LIPITOR) 20 MG tablet Take 1 tablet (20 mg total) by mouth daily. (Patient not taking: Reported on 10/09/2019)  . cyclobenzaprine (FLEXERIL) 10 MG tablet TAKE 1 TABLET BY MOUTH THREE TIMES A DAY AS NEEDED FOR MUSCLE SPASMS (Patient not taking: Reported on 10/09/2019)  . HYDROcodone-acetaminophen (NORCO) 10-325 MG tablet Take 1 tablet by mouth every 8 (eight) hours as needed.  Marland Kitchen ibuprofen (ADVIL) 200 MG tablet Take 600 mg by mouth every 6 (six) hours as needed for pain or fever.   . Isavuconazonium Sulfate 186 MG CAPS Take 1 capsule (186 mg total) by mouth as directed. Load with 2 capsules every 8 hours x 6 doses, then 2 capsules daily the following day (Patient not taking: Reported on 07/24/2019)  . pentoxifylline (TRENTAL) 400 MG CR tablet Take 400mg  tab daily x 1 week, then 400mg  BID; take with food. Upset stomach is rare on this med, but stop if you have upset stomach.  . predniSONE (STERAPRED UNI-PAK 21 TAB) 10 MG (21) TBPK tablet Take 5 tabs (50mg ) PO on day 1, then 4 tabs on day 2, 3 tabs on day 3, 2 tabs on day 4, and 1 tab on day 5 (Patient not taking: Reported on 10/09/2019)  . sodium fluoride (PREVIDENT 5000 PLUS) 1.1 % CREA dental cream Apply to tooth brush. Brush teeth for 2 minutes. Spit out excess. DO NOT rinse afterwards. Repeat nightly. (Patient not taking: Reported on 08/14/2019)  . vitamin E 180 MG (400 UNITS) capsule Take 400 IU daily x 1 week, then 400 IU BID  . warfarin (COUMADIN) 10 MG tablet Take 1 tablet of 10 mg warfarin daily except on Sundays, Thursdays and Saturdays, take only 1/2 tablet (5 mg).   No facility-administered encounter medications on file as of 10/17/2019.      Kaili Castille Jenetta Downer, NP

## 2019-10-18 ENCOUNTER — Telehealth: Payer: Self-pay | Admitting: Hematology

## 2019-10-18 ENCOUNTER — Inpatient Hospital Stay (HOSPITAL_BASED_OUTPATIENT_CLINIC_OR_DEPARTMENT_OTHER): Payer: Medicaid Other | Admitting: Hematology

## 2019-10-18 ENCOUNTER — Other Ambulatory Visit: Payer: Self-pay

## 2019-10-18 ENCOUNTER — Encounter: Payer: Self-pay | Admitting: Radiation Oncology

## 2019-10-18 ENCOUNTER — Encounter: Payer: Self-pay | Admitting: Hematology

## 2019-10-18 ENCOUNTER — Inpatient Hospital Stay: Payer: Medicaid Other | Attending: Hematology

## 2019-10-18 ENCOUNTER — Other Ambulatory Visit: Payer: Self-pay | Admitting: Hematology

## 2019-10-18 ENCOUNTER — Other Ambulatory Visit: Payer: Self-pay | Admitting: Radiation Oncology

## 2019-10-18 VITALS — BP 121/80 | HR 78 | Temp 97.8°F | Resp 18 | Ht 75.0 in | Wt 191.5 lb

## 2019-10-18 DIAGNOSIS — C090 Malignant neoplasm of tonsillar fossa: Secondary | ICD-10-CM

## 2019-10-18 DIAGNOSIS — I825Y1 Chronic embolism and thrombosis of unspecified deep veins of right proximal lower extremity: Secondary | ICD-10-CM | POA: Insufficient documentation

## 2019-10-18 DIAGNOSIS — B370 Candidal stomatitis: Secondary | ICD-10-CM | POA: Diagnosis not present

## 2019-10-18 DIAGNOSIS — Z923 Personal history of irradiation: Secondary | ICD-10-CM | POA: Insufficient documentation

## 2019-10-18 DIAGNOSIS — Z7901 Long term (current) use of anticoagulants: Secondary | ICD-10-CM | POA: Insufficient documentation

## 2019-10-18 DIAGNOSIS — Z79899 Other long term (current) drug therapy: Secondary | ICD-10-CM | POA: Insufficient documentation

## 2019-10-18 LAB — CBC WITH DIFFERENTIAL (CANCER CENTER ONLY)
Abs Immature Granulocytes: 0.01 10*3/uL (ref 0.00–0.07)
Basophils Absolute: 0 10*3/uL (ref 0.0–0.1)
Basophils Relative: 1 %
Eosinophils Absolute: 0.2 10*3/uL (ref 0.0–0.5)
Eosinophils Relative: 4 %
HCT: 38.7 % — ABNORMAL LOW (ref 39.0–52.0)
Hemoglobin: 12.1 g/dL — ABNORMAL LOW (ref 13.0–17.0)
Immature Granulocytes: 0 %
Lymphocytes Relative: 13 %
Lymphs Abs: 0.6 10*3/uL — ABNORMAL LOW (ref 0.7–4.0)
MCH: 28.3 pg (ref 26.0–34.0)
MCHC: 31.3 g/dL (ref 30.0–36.0)
MCV: 90.4 fL (ref 80.0–100.0)
Monocytes Absolute: 0.5 10*3/uL (ref 0.1–1.0)
Monocytes Relative: 11 %
Neutro Abs: 3.4 10*3/uL (ref 1.7–7.7)
Neutrophils Relative %: 71 %
Platelet Count: 266 10*3/uL (ref 150–400)
RBC: 4.28 MIL/uL (ref 4.22–5.81)
RDW: 15.2 % (ref 11.5–15.5)
WBC Count: 4.8 10*3/uL (ref 4.0–10.5)
nRBC: 0 % (ref 0.0–0.2)

## 2019-10-18 LAB — CMP (CANCER CENTER ONLY)
ALT: 7 U/L (ref 0–44)
AST: 11 U/L — ABNORMAL LOW (ref 15–41)
Albumin: 3.3 g/dL — ABNORMAL LOW (ref 3.5–5.0)
Alkaline Phosphatase: 94 U/L (ref 38–126)
Anion gap: 10 (ref 5–15)
BUN: 10 mg/dL (ref 6–20)
CO2: 30 mmol/L (ref 22–32)
Calcium: 9.7 mg/dL (ref 8.9–10.3)
Chloride: 100 mmol/L (ref 98–111)
Creatinine: 0.74 mg/dL (ref 0.61–1.24)
GFR, Est AFR Am: >60 mL/min (ref >60)
GFR, Estimated: >60 mL/min (ref >60)
Glucose, Bld: 97 mg/dL (ref 70–99)
Potassium: 4.1 mmol/L (ref 3.5–5.1)
Sodium: 140 mmol/L (ref 135–145)
Total Bilirubin: 0.4 mg/dL (ref 0.3–1.2)
Total Protein: 6.9 g/dL (ref 6.5–8.1)

## 2019-10-18 NOTE — Progress Notes (Signed)
Received call from DJ at [facility] that referral was received for hyperbaric oxygen would care to area on patient's throat. DJ stated that patient would be able to receive treatment, but more information was needed to get process started.  Faxed radiation EOT note, most recent office note from Dr. Isidore Moos, and palliative care note to [phone]. Received confirmation that fax went through successfully. Will continue to support as needed.

## 2019-10-18 NOTE — Telephone Encounter (Signed)
Scheduled per los. Patient declined printout  

## 2019-10-18 NOTE — Progress Notes (Signed)
[place] OFFICE PROGRESS NOTE  Patient Care Team: [name], MD as PCP - General (Internal Medicine) Annia Belt, MD as Consulting Physician (Oncology) Leta Baptist, MD as Consulting Physician (Otolaryngology) Eppie Gibson, MD as Attending Physician (Radiation Oncology) Tish Men, MD as Consulting Physician (Hematology) Leota Sauers, RN as Oncology Nurse Navigator  HEME/ONC OVERVIEW: 1. 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 Benjamine Mola, invasive SCCa, p16+; not a candidate for TORS -Late 10/2018 - 12/2018: definitive chemoradiation with weekly cisplatin   03/2019: end-of-treatment PET showed decrease in FDG avidity in the left tonsil but some residual soft tissue fullness, resolution of left LN disease   -2021:   Diffuse pharyngeal soft tissue thickening at the tongue base on multiple CT's   Necrotic tissue on multiple biopsies at [facility], suggestive of radionecrosis; no evidence of malignancy   2. 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 -12/2018: acute DVT involving R peroneal vein and L gastrocnemius vein; new arterial thrombosis involving R common femoral artery and popliteal arteries, due to artery aneurysm -Late 12/2018: progression of acute DVT in the LLE from gastrocnemius vein to the femoral, popliteal, posterior tibial and peroneal veins despite being on Eliquis  -Currently on warfarin   TREATMENT REGIMEN:  11/08/2018 - 12/29/2018: definitive chemoradiation with weekly cisplatin x 6  Anticoagulation:  Early 2000 - 12/2018: warfarin, non-compliant   12/2018: Eliquis 5mg  BID x ~4 weeks with clot  progression  Late 12/2018 - present: warfarin  PERTINENT NON-HEM/ONC PROBLEMS: 1. Tobacco and EtOH abuse 2. Paroxysmal A-fib 3. 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, as evidenced by necrotic tissue on multiple biopsies and the absence of any residual malignancy -He has been referred to wound care center for consideration of hyperbaric oxygen treatment  -I counseled the patient on the importance of ENT surveillance, as well as shoulder and jaw exercises to improve ROM   Recurrent LLE DVT -Currently on warfarin -Continue follow-up with anticoagulation clinic  -Goal of anticoagulation is lifelong   Persistent oral candidiasis -Despite aggressive antifungal therapy, including amphotericin, there was no significant improvement in oral candidiasis -Infectious disease felt that the oral candidiasis was noninfectious, and therefore does not warrant any further treatment at this time -I counseled the patient on the importance of maintaining good oral hygiene    Orders Placed This Encounter  Procedures  . CBC with Differential (Cancer Center Only)    Standing Status:   Future    Standing Expiration Date:   11/21/2020  . CMP ([place] only)    Standing Status:   Future    Standing Expiration Date:   11/21/2020  . TSH    Standing Status:   Future    Standing Expiration Date:   10/17/2020   The total time spent in the encounter was 35 minutes, including face-to-face time with the patient, review of various tests results,  order additional studies/medications, documentation, and coordination of care plan.   All questions were answered. The patient knows to call the clinic with any problems, questions or concerns. No barriers to  learning was detected.  Return in 3 months for labs and clinic follow-up.  Tish Men, MD 3/31/20213:24 PM  CHIEF COMPLAINT: "I am doing a little better"  INTERVAL HISTORY: Mr. Illes returns clinic for follow-up of history of squamous cell carcinoma of the left tonsil.  Patient has had multiple biopsies at [facility], most recently in mid-09/2019, which did not show any evidence of malignancy.  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  He has been referred to wound care clinic for hyperbaric oxygen, which he is scheduled at [facility], but the date has not yet been determined.  His jaw opening is slightly better, and he is able to eat mashed potato, small bites of chop steak, eggs, and shrimp, among other types of soft food.  He denies any other complaint today.  REVIEW OF SYSTEMS:   Constitutional: ( - ) fevers, ( - )  chills , ( - ) night sweats Eyes: ( - ) blurriness of vision, ( - ) double vision, ( - ) watery eyes Ears, nose, mouth, throat, and face: ( - ) mucositis, ( - ) sore throat Respiratory: ( - ) cough, ( - ) dyspnea, ( - ) wheezes Cardiovascular: ( - ) palpitation, ( - ) chest discomfort, ( - ) lower extremity swelling Gastrointestinal:  ( - ) nausea, ( - ) heartburn, ( - ) change in bowel habits Skin: ( - ) abnormal skin rashes Lymphatics: ( - ) new lymphadenopathy, ( - ) easy bruising Neurological: ( - ) numbness, ( - ) tingling, ( - ) new weaknesses Behavioral/Psych: ( - ) mood change, ( - ) new changes  All other systems were reviewed with the patient and are negative.  SUMMARY OF ONCOLOGIC HISTORY: Oncology History  Malignant neoplasm of tonsillar fossa ([place])  10/10/2018 Imaging   CT neck w/ contrast: FINDINGS: Pharynx and larynx: There is asymmetric prominence of the left tonsillar region. No other mucosal or submucosal lesion is seen.  Salivary glands: Parotid and submandibular glands are  normal.  Thyroid: Normal  Lymph nodes: Definite adenopathy on the left. 2 level nodes which shows some internal necrosis. The larger measures 2.2 cm in diameter and the smaller measures 1.4 cm in diameter. Suspicious level 4 node image 83 just anterior to the jugular vein measuring 9 mm short axis diameter. No worrisome nodes on the right.   10/10/2018 Imaging   CT chest:  IMPRESSION: 1. Mild right hilar adenopathy, equivocal for metastasis. No additional potential evidence of metastatic disease in the chest. PET-CT could be considered for further staging evaluation, as clinically warranted. 2. 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 Ascending thoracic aortic aneurysm. Recommend semi-annual imaging followup by CTA or MRA and referral to cardiothoracic surgery if not already obtained. This recommendation follows 2010 ACCF/AHA/AATS/ACR/ASA/SCA/SCAI/SIR/STS/SVM Guidelines for the Diagnosis and Management of Patients With Thoracic Aortic Disease. Circulation. 2010; [id number]. Aortic aneurysm NOS (ICD10-I71.9).   10/17/2018 Initial Diagnosis   Tonsil cancer ([place])   10/18/2018 Imaging   PET: IMPRESSION: 1. 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 2. Local hypermetabolic nodal metastasis to the LEFT level II nodal station. 3. No contralateral nodal metastasis. 4. No evidence distant metastatic disease. 5. Two small pulmonary nodules in the LEFT lower lobe are favored benign. Recommend attention  on follow-up. 6. Several small common iliac lymph nodes with low metabolic activity. Favor reactive adenopathy.   10/21/2018 Cancer Staging   Staging form: Pharynx - HPV-Mediated Oropharynx, AJCC 8th Edition - Clinical: Stage I (cT2, cN1, cM0, p16+) - Signed by Eppie Gibson, MD on 10/21/2018   11/10/2018 -  Chemotherapy   The patient had palonosetron (ALOXI) injection 0.25 mg, 0.25 mg, Intravenous,  Once, 6 of 7 cycles Administration: 0.25 mg  (11/10/2018), 0.25 mg (11/17/2018), 0.25 mg (12/22/2018), 0.25 mg (11/24/2018), 0.25 mg (12/15/2018), 0.25 mg (12/29/2018) CISplatin (PLATINOL) 100 mg in sodium chloride 0.9 % 500 mL chemo infusion, 40 mg/m2 = 100 mg, Intravenous,  Once, 6 of 7 cycles Dose modification: 30 mg/m2 (original dose 40 mg/m2, Cycle 5, Reason: Dose not tolerated) Administration: 100 mg (11/10/2018), 100 mg (11/17/2018), 75 mg (12/22/2018), 100 mg (11/24/2018), 75 mg (12/15/2018), 75 mg (12/29/2018) fosaprepitant (EMEND) 150 mg, dexamethasone (DECADRON) 12 mg in sodium chloride 0.9 % 145 mL IVPB, , Intravenous,  Once, 6 of 7 cycles Administration:  (11/10/2018),  (11/17/2018),  (12/22/2018),  (11/24/2018),  (12/15/2018),  (12/29/2018)  for chemotherapy treatment.    04/10/2019 Imaging   PET (end-of-treatment) IMPRESSION: 1. Interval decrease in hypermetabolism associated with the left tonsillar region. CT imaging does demonstrate some residual soft tissue fullness in this region. 2. Interval resolution of left-sided level II hypermetabolic nodal metastases. 3. No evidence for hypermetabolic metastatic disease in the chest, abdomen, or pelvis. 4. 2 tiny left lower lobe pulmonary nodules were identified on the previous study. One of these nodules is stable and the other has resolved. Continued attention on follow-up suggested. 5. The small bilateral common iliac nodes seen previously are unchanged, likely reactive 6.  Aortic Atherosclerois (ICD10-170.0) 7.  Emphysema. [id number])     I have reviewed the past medical history, past surgical history, social history and family history with the patient and they are unchanged from previous note.  ALLERGIES:  has No Known Allergies.  MEDICATIONS:  Current Outpatient Medications  Medication Sig Dispense Refill  . amLODipine (NORVASC) 10 MG tablet Take 1 tablet (10 mg total) by mouth daily. 90 tablet 2  . atorvastatin (LIPITOR) 20 MG tablet Take 1 tablet (20 mg total) by mouth daily. 30  tablet 11  . cyclobenzaprine (FLEXERIL) 10 MG tablet TAKE 1 TABLET BY MOUTH THREE TIMES A DAY AS NEEDED FOR MUSCLE SPASMS 30 tablet 0  . HYDROcodone-acetaminophen (NORCO) 10-325 MG tablet Take 1 tablet by mouth every 8 (eight) hours as needed. 40 tablet 0  . ibuprofen (ADVIL) 200 MG tablet Take 600 mg by mouth every 6 (six) hours as needed for pain or fever.     . pentoxifylline (TRENTAL) 400 MG CR tablet Take 400mg  tab daily x 1 week, then 400mg  BID; take with food. Upset stomach is rare on this med, but stop if you have upset stomach. 60 tablet 5  . vitamin E 180 MG (400 UNITS) capsule Take 400 IU daily x 1 week, then 400 IU BID 60 capsule 5  . warfarin (COUMADIN) 10 MG tablet Take 1 tablet of 10 mg warfarin daily except on Sundays, Thursdays and Saturdays, take only 1/2 tablet (5 mg). 72 tablet 2  . sodium fluoride (PREVIDENT 5000 PLUS) 1.1 % CREA dental cream Apply to tooth brush. Brush teeth for 2 minutes. Spit out excess. DO NOT rinse afterwards. Repeat nightly. (Patient not taking: Reported on 08/14/2019) 1 Tube prn   No current facility-administered medications for this visit.    PHYSICAL EXAMINATION:  ECOG PERFORMANCE STATUS: 1 - Symptomatic but completely ambulatory  Today's Vitals   10/18/19 1500  BP: 121/80  Pulse: 78  Resp: 18  Temp: 97.8 F (36.6 C)  TempSrc: Oral  SpO2: 99%  Weight: 191 lb 8 oz (86.9 kg)  Height: 6\' 3"  (1.905 m)  PainSc: 2    Body mass index is 23.94 kg/m.  Filed Weights   10/18/19 1500  Weight: 191 lb 8 oz (86.9 kg)    GENERAL: alert, no distress and comfortable SKIN: skin color, texture, turgor are normal, no rashes or significant lesions EYES: conjunctiva are pink and non-injected, sclera clear OROPHARYNX: moderate to severe trismus (jaw opening ~1cm), limited visualization of the oral cavity  NECK: firm, c/w lyphdemea, non-tender LYMPH:  no palpable lymphadenopathy in the cervical LUNGS: clear to auscultation with normal breathing  effort HEART: regular rate & rhythm and no murmurs and no lower extremity edema ABDOMEN: soft, non-tender, non-distended, normal bowel sounds Musculoskeletal: no cyanosis of digits and no clubbing  PSYCH: alert & oriented x 3, muffled speech  LABORATORY DATA:  I have reviewed the data as listed    Component Value Date/Time   NA 140 10/18/2019 1449   NA 142 10/03/2018 1450   NA 141 05/21/2014 1226   K 4.1 10/18/2019 1449   K 4.3 05/21/2014 1226   CL 100 10/18/2019 1449   CL 108 (H) 12/28/2012 1405   CO2 30 10/18/2019 1449   CO2 28 05/21/2014 1226   GLUCOSE 97 10/18/2019 1449   GLUCOSE 101 05/21/2014 1226   GLUCOSE 95 12/28/2012 1405   BUN 10 10/18/2019 1449   BUN 15 10/03/2018 1450   BUN 13.2 05/21/2014 1226   CREATININE 0.74 10/18/2019 1449   CREATININE 0.89 08/21/2019 1523   CREATININE 0.9 05/21/2014 1226   CALCIUM 9.7 10/18/2019 1449   CALCIUM 9.3 05/21/2014 1226   PROT 6.9 10/18/2019 1449   PROT 7.2 10/03/2018 1450   PROT 7.1 01/08/2014 1203   ALBUMIN 3.3 (L) 10/18/2019 1449   ALBUMIN 4.3 10/03/2018 1450   ALBUMIN 3.7 01/08/2014 1203   AST 11 (L) 10/18/2019 1449   AST 50 (H) 01/08/2014 1203   ALT 7 10/18/2019 1449   ALT 83 (H) 01/08/2014 1203   ALKPHOS 94 10/18/2019 1449   ALKPHOS 87 01/08/2014 1203   BILITOT 0.4 10/18/2019 1449   BILITOT 0.67 01/08/2014 1203   GFRNONAA >60 10/18/2019 1449   GFRNONAA >60 10/15/2011 0224   GFRAA >60 10/18/2019 1449   GFRAA >60 10/15/2011 0224    No results found for: SPEP, UPEP  Lab Results  Component Value Date   WBC 4.8 10/18/2019   NEUTROABS 3.4 10/18/2019   HGB 12.1 (L) 10/18/2019   HCT 38.7 (L) 10/18/2019   MCV 90.4 10/18/2019   PLT 266 10/18/2019      Chemistry      Component Value Date/Time   NA 140 10/18/2019 1449   NA 142 10/03/2018 1450   NA 141 05/21/2014 1226   K 4.1 10/18/2019 1449   K 4.3 05/21/2014 1226   CL 100 10/18/2019 1449   CL 108 (H) 12/28/2012 1405   CO2 30 10/18/2019 1449   CO2 28  05/21/2014 1226   BUN 10 10/18/2019 1449   BUN 15 10/03/2018 1450   BUN 13.2 05/21/2014 1226   CREATININE 0.74 10/18/2019 1449   CREATININE 0.89 08/21/2019 1523   CREATININE 0.9 05/21/2014 1226      Component Value Date/Time   CALCIUM 9.7 10/18/2019 1449  CALCIUM 9.3 05/21/2014 1226   ALKPHOS 94 10/18/2019 1449   ALKPHOS 87 01/08/2014 1203   AST 11 (L) 10/18/2019 1449   AST 50 (H) 01/08/2014 1203   ALT 7 10/18/2019 1449   ALT 83 (H) 01/08/2014 1203   BILITOT 0.4 10/18/2019 1449   BILITOT 0.67 01/08/2014 1203       RADIOGRAPHIC STUDIES: I have personally reviewed the radiological images as listed below and agreed with the findings in the report. DG Chest 2 View  Result Date: 09/24/2019 CLINICAL DATA:  Shortness of breath and chest congestion with productive cough. Thrush. Treated with radiation therapy and chemotherapy 1 year ago for tonsillar cancer. EXAM: CHEST - 2 VIEW COMPARISON:  10/15/2011 FINDINGS: Normal sized heart. Tortuous aorta. Stable left basilar pleural and parenchymal scarring. The lungs are hyperexpanded with bullous changes. Interval minimal patchy/linear density in the right upper lung zone. Unremarkable bones. IMPRESSION: 1. Interval minimal right upper lung zone atelectasis or pneumonia. 2. Stable changes of COPD. 3. Stable left basilar pleural and parenchymal scarring. Electronically Signed   By: Claudie Revering M.D.   On: 09/24/2019 11:21   CT Soft Tissue Neck W Contrast  Result Date: 09/24/2019 CLINICAL DATA:  Increased secretions EXAM: CT NECK WITH CONTRAST TECHNIQUE: Multidetector CT imaging of the neck was performed using the standard protocol following the bolus administration of intravenous contrast. CONTRAST:  61mL OMNIPAQUE IOHEXOL 300 MG/ML  SOLN COMPARISON:  09/11/2019 FINDINGS: Pharynx and larynx: Stable appearance of diffuse pharyngeal soft tissue thickening and associated airway narrowing related to radiation therapy. Stable lobular soft tissue at the  tongue base (series 2, image 42). Mucosal irregularity of the anterolateral left pharyngeal wall at this level extending caudally with adjacent intraluminal retained debris again noted. Unchanged nonspecific asymmetric left tonsillar region soft tissue thickening. Salivary glands: Stable indeterminate 6 mm left parotid lesion, which could reflect a node or primary parotid neoplasm. Parotid glands are otherwise unremarkable. Post radiation changes of the submandibular glands. Thyroid: Unremarkable. Lymph nodes: No new adenopathy. Vascular: Major neck vessels are patent. Limited intracranial: No abnormal enhancement. Visualized orbits: Unremarkable Mastoids and visualized paranasal sinuses: Left maxillary sinus polypoid mucosal thickening. Mastoid air cells are clear. Skeleton: Stable mild degenerative changes. Upper chest: Increased bandlike opacity in the right upper lobe (series 6, image 48). Other: None. IMPRESSION: No substantial change since recent prior study of 09/11/2019 with persistent sequelae of radiation therapy and possible recurrent tumor. Increased bandlike opacity in the right upper lobe which could reflect scarring. Electronically Signed   By: Macy Mis M.D.   On: 09/24/2019 11:58

## 2019-10-19 ENCOUNTER — Ambulatory Visit: Payer: Medicaid Other | Attending: Radiation Oncology | Admitting: Physical Therapy

## 2019-10-19 DIAGNOSIS — R1310 Dysphagia, unspecified: Secondary | ICD-10-CM | POA: Diagnosis not present

## 2019-10-19 DIAGNOSIS — C024 Malignant neoplasm of lingual tonsil: Secondary | ICD-10-CM | POA: Diagnosis not present

## 2019-10-19 DIAGNOSIS — L599 Disorder of the skin and subcutaneous tissue related to radiation, unspecified: Secondary | ICD-10-CM

## 2019-10-19 DIAGNOSIS — I890 Lymphedema, not elsewhere classified: Secondary | ICD-10-CM | POA: Diagnosis not present

## 2019-10-19 DIAGNOSIS — R293 Abnormal posture: Secondary | ICD-10-CM

## 2019-10-19 LAB — TSH: TSH: 2.943 u[IU]/mL (ref 0.320–4.118)

## 2019-10-19 NOTE — Therapy (Signed)
[place], [place], [address] Phone: [phone]   Fax:  [phone]  Physical Therapy Treatment  Patient Details  Name: [name] [name] MRN: [id number] Date of Birth: [birth year] Referring Provider (PT): Isidore Moos   Encounter Date: 10/19/2019   Progress Note Reporting Period 06/28/2019  to 10/19/2019  See note below for Objective Data and Assessment of Progress/Goals.      PT End of Session - 10/19/19 1738    Visit Number  20    Number of Visits  24    Date for PT Re-Evaluation  11/03/19    Authorization Type  pending medicaid approval- 3 visits per 30 day auth; 09/28/19-10/25/19 for 8 visits    PT Start Time  1500    PT Stop Time  1545    PT Time Calculation (min)  45 min    Activity Tolerance  Patient tolerated treatment well    Behavior During Therapy  [facility] for tasks assessed/performed       Past Medical History:  Diagnosis Date  . 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 Benign essential HTN 01/04/2012  . Cancer ([place])    tonsilar  . Chronic anticoagulation 01/02/2013  . Dermatofibroma of forearm 01/02/2013   Left side  . Hyperlipidemia, mixed 01/04/2012  . 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 Primary hypercoagulable state ([place]) 10/01/2014  . Sinus bradycardia, chronic 01/04/2012  . Superficial thrombosis of lower extremity 05/02/2012    Past Surgical History:  Procedure Laterality Date  . DIRECT LARYNGOSCOPY Left 10/19/2018   Procedure: DIRECT LARYNGOSCOPY;  Surgeon: Leta Baptist, MD;  Location: [place];  Service: ENT;  Laterality: Left;  . IR IMAGING GUIDED PORT INSERTION  11/04/2018  . IR REMOVAL TUN ACCESS W/ PORT W/O FL MOD SED  05/01/2019  . TONSILLECTOMY Left 10/19/2018   Procedure: BIOPSY OF LEFT TONSIL;   Surgeon: Leta Baptist, MD;  Location: [place];  Service: ENT;  Laterality: Left;    There were no vitals filed for this visit.  Subjective Assessment - 10/19/19 1506    Subjective  "The usual"  Pt says that he can do the exercises at home He ordered a therabite online that shoulde be here next week.  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  Yes    Pain Score  3     Pain Location  Neck    Pain Orientation  Left;Anterior    Pain Descriptors / Indicators  Aching    Pain Type  Chronic pain    Pain Radiating Towards  toward back of ear                       [facility] Adult PT Treatment/Exercise - 10/19/19 0001      Exercises   Exercises  Neck;Shoulder      Neck Exercises: Seated   Other Seated Exercise  neck ROM encouraging good posture, pt  mostly limited in extension     Other Seated Exercise  shoulder rolls       Neck Exercises: Supine   Other Supine Exercise  Tongue pressed to the roof ot he mouth 10x 10 second holds with improvement in mouth opening following exercise.     Other Supine Exercise  Isometric contract/relax of the pterygoid lateralis and digastric muscles with isometric opening 10 seconds followed by stretching into open by therapist 10 seconds 10x       Manual Therapy   Manual Therapy  Joint mobilization;Myofascial release;Soft tissue mobilization;Manual Lymphatic Drainage (MLD)    Joint Mobilization   internally to TMJ with distraction and oscillations with gloved thumb on back teeth , also gently side to side    Soft tissue mobilization  with thick massage cream to tight tissue at left side of neck with prolonged pressure at  trigger points     Manual Lymphatic Drainage (MLD)  In Supine with HOB elevated: Short and long neck, bil shoulder collectors, bil axillary nodes, 5 diaphragmatic breaths, anterior throat and submental nodes then retraced all steps ending with axillary nodes.     Passive ROM  with thumbs on both sides on lower teeth , passive stretch to mouth opening 5 reps x 5 sec. Pt with increased opening after stretch                   PT Long Term Goals - 10/19/19 1742      PT LONG TERM GOAL #1   Title  Pt will be able to independently manage lymphedema through self MLD and compression garments    Baseline  pt needs review of how to properly use compression garments and flexitouch    Time  4    Period  Weeks    Status  On-going      PT LONG TERM GOAL #2   Title  Pt will receive appropriate compression garments for long term management of lymphedema    Status  Achieved      PT LONG TERM GOAL #3   Title  Pt will reduce swelling at 8 cm superior to sternal notch by 2 cm to decrease risk of swelling.    Baseline  50.2 cm, 04/25/19- 50.4 but rest of neck demonstrates around 1.5 cm reduction, 06/01/19: 49.1; 42.5 cm - 07/03/19  40 on 09/26/2019    Status  Achieved      PT LONG TERM GOAL #4   Title  Pt to demonstrate 30 mm of jaw opening to allow pt to eat food.    Baseline  17mm- pt unable to eat, on 3/9/202115  mm only able to eat soup    Time  4    Period  Weeks    Status  On-going      PT LONG TERM GOAL #5   Title  Pt to demonstrate 8 cm of bilateral lateral jaw excursion to allow pt to return to PLOF    Baseline  R 57mm, L 59mm  on 09/26/3019  R 7 mm, L 10 mm    Time  4    Period  Weeks    Status  On-going      PT LONG TERM GOAL #6   Title  Pt will repot a 50% decrease in jaw pain to allow pt to return to prior level of function and be able to eat.    Period  Weeks    Status  Achieved  Plan - 10/19/19 1739    Clinical Impression Statement  Pt is making slow gains,  but admits he is not following through with everythings that was discussed last session. He did order the therabyte and will be starting hyperbaric oxygen too  .Those sessions will be 5 times a week. His authorization period for [place] will be up next week    Stability/Clinical Decision Making  Evolving/Moderate complexity    Rehab Potential  Good    PT Frequency  Other (comment)    PT Duration  4 weeks    PT Treatment/Interventions  ADLs/Self Care Home Management;Therapeutic exercise;Manual lymph drainage;Manual techniques;Compression bandaging;Taping;Therapeutic activities;Patient/family education;Passive range of motion;Joint Manipulations    PT Next Visit Plan  work on chin tuck and contract relax with masseter/medial pterygoid next session, continue with myofascial relase to head and neck,  Jaw massage and mobilization,  MLD to head and neck, try tongue to roof of mouth, jaw retraction/protraction and cervical retraction.    Consulted and Agree with Plan of Care  Patient       Patient will benefit from skilled therapeutic intervention in order to improve the following deficits and impairments:  Postural dysfunction, Decreased strength, Increased edema, Decreased knowledge of precautions, Pain, Increased fascial restricitons, Decreased range of motion, Increased muscle spasms  Visit Diagnosis: Disorder of the skin and subcutaneous tissue related to radiation, unspecified  Lymphedema, not elsewhere classified  Abnormal posture     Problem List Patient Active Problem List   Diagnosis Date Noted  . Hypokalemia 08/21/2019  . Warfarin anticoagulation 02/20/2019  . Hypercholesteremia 02/07/2019  . Recurrent deep vein thrombosis (DVT) ([place]) 02/06/2019  . Thoracic ascending aortic aneurysm ([place]) 02/06/2019  . Port-A-Cath in place 01/18/2019  . Mucositis due to chemotherapy 12/07/2018  . Hypomagnesemia 12/07/2018  . Thrush 12/07/2018  . Malignant neoplasm of tonsillar fossa ([place]) 10/17/2018   . 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 DVT, lower extremity, proximal ([place]) 05/24/2013  . Chronic anticoagulation 01/02/2013  . Superficial thrombosis of lower extremity 05/02/2012  . Benign essential HTN 01/04/2012  . Hyperlipidemia, mixed 01/04/2012   [name]  Norwood Levo 10/19/2019, 5:44 PM  [place], [place], [address] Phone: [phone]   Fax:  [phone]  Name: ABRAM PLESHA MRN: [id number] Date of Birth: 1960-12-13

## 2019-10-23 ENCOUNTER — Ambulatory Visit (INDEPENDENT_AMBULATORY_CARE_PROVIDER_SITE_OTHER): Payer: Medicaid Other | Admitting: Pharmacist

## 2019-10-23 ENCOUNTER — Other Ambulatory Visit: Payer: Self-pay

## 2019-10-23 ENCOUNTER — Ambulatory Visit: Payer: Medicaid Other

## 2019-10-23 DIAGNOSIS — Z86718 Personal history of other venous thrombosis and embolism: Secondary | ICD-10-CM

## 2019-10-23 DIAGNOSIS — Z7901 Long term (current) use of anticoagulants: Secondary | ICD-10-CM

## 2019-10-23 DIAGNOSIS — L599 Disorder of the skin and subcutaneous tissue related to radiation, unspecified: Secondary | ICD-10-CM

## 2019-10-23 DIAGNOSIS — I890 Lymphedema, not elsewhere classified: Secondary | ICD-10-CM

## 2019-10-23 DIAGNOSIS — Z86711 Personal history of pulmonary embolism: Secondary | ICD-10-CM | POA: Diagnosis not present

## 2019-10-23 DIAGNOSIS — Z5181 Encounter for therapeutic drug level monitoring: Secondary | ICD-10-CM

## 2019-10-23 DIAGNOSIS — R293 Abnormal posture: Secondary | ICD-10-CM | POA: Diagnosis not present

## 2019-10-23 DIAGNOSIS — I82409 Acute embolism and thrombosis of unspecified deep veins of unspecified lower extremity: Secondary | ICD-10-CM

## 2019-10-23 DIAGNOSIS — D6859 Other primary thrombophilia: Secondary | ICD-10-CM

## 2019-10-23 DIAGNOSIS — R1310 Dysphagia, unspecified: Secondary | ICD-10-CM

## 2019-10-23 LAB — POCT INR: INR: 2.7 (ref 2.0–3.0)

## 2019-10-23 NOTE — Patient Instructions (Signed)
Patient instructed to take medications as defined in the Anti-coagulation Track section of this encounter.  Patient instructed to take today's dose.  Patient instructed to take one (1) tablet of your 10mg -white warfarin tablets on Mondays, Tuesdays,  Wednesdays and Fridays. All OTHER days--take only one-half (1/2) of your 10mg -white warfarin tablets.  Patient verbalized understanding of these instructions.

## 2019-10-23 NOTE — Progress Notes (Signed)
Anticoagulation Management [name] [name] is a 59 y.o. male who reports to the clinic for monitoring of warfarin treatment.    Indication: History of PE; History of DVT; Long term current use of anticoagulant warfarin with INR range established by Dr. Beryle Beams 2.5 - 3.5 Duration: indefinite Supervising physician: [facility] Visit History: Patient does not report signs/symptoms of bleeding or thromboembolism  Other recent changes: No diet, medications, lifestyle changes endorsed at this visit.  Anticoagulation Episode Summary    Current INR goal:  2.5-3.5  TTR:  32.9 % (8.4 y)  Next INR check:  11/13/2019  INR from last check:  2.7 (10/23/2019)  Weekly max warfarin dose:    Target end date:    INR check location:    Preferred lab:    Send INR reminders to:  ANTICO[facility]   Indications   Pulmonary embolism (HCC) (Resolved) [I26.99]       Comments:        Anticoagulation Care Providers    Provider Role Specialty Phone number   Annia Belt, MD Referring Oncology [phone]      No Known Allergies  Current Outpatient Medications:  .  amLODipine (NORVASC) 10 MG tablet, Take 1 tablet (10 mg total) by mouth daily., Disp: 90 tablet, Rfl: 2 .  atorvastatin (LIPITOR) 20 MG tablet, Take 1 tablet (20 mg total) by mouth daily., Disp: 30 tablet, Rfl: 11 .  cyclobenzaprine (FLEXERIL) 10 MG tablet, TAKE 1 TABLET BY MOUTH THREE TIMES A DAY AS NEEDED FOR MUSCLE SPASMS, Disp: 30 tablet, Rfl: 0 .  HYDROcodone-acetaminophen (NORCO) 10-325 MG tablet, Take 1 tablet by mouth every 8 (eight) hours as needed., Disp: 40 tablet, Rfl: 0 .  ibuprofen (ADVIL) 200 MG tablet, Take 600 mg by mouth every 6 (six) hours as needed for pain or fever. , Disp: , Rfl:  .  pentoxifylline (TRENTAL) 400 MG CR tablet, Take 400mg  tab daily x 1 week, then 400mg  BID; take with food. Upset stomach is rare on this med, but stop if you have upset stomach., Disp: 60 tablet, Rfl: 5 .   sodium fluoride (PREVIDENT 5000 PLUS) 1.1 % CREA dental cream, Apply to tooth brush. Brush teeth for 2 minutes. Spit out excess. DO NOT rinse afterwards. Repeat nightly., Disp: 1 Tube, Rfl: prn .  vitamin E 180 MG (400 UNITS) capsule, Take 400 IU daily x 1 week, then 400 IU BID, Disp: 60 capsule, Rfl: 5 .  warfarin (COUMADIN) 10 MG tablet, Take 1 tablet of 10 mg warfarin daily except on Sundays, Thursdays and Saturdays, take only 1/2 tablet (5 mg)., Disp: 72 tablet, Rfl: 2 Past Medical History:  Diagnosis Date  . 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 Benign essential HTN 01/04/2012  . Cancer ([place])    tonsilar  . Chronic anticoagulation 01/02/2013  . Dermatofibroma of forearm 01/02/2013   Left side  . Hyperlipidemia, mixed 01/04/2012  . 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 Primary hypercoagulable state ([place]) 10/01/2014  . Sinus bradycardia, chronic 01/04/2012  . Superficial thrombosis of lower extremity 05/02/2012   Social History   Socioeconomic History  . Marital status: Divorced    Spouse name: Not on file  . Number of children: 2  . Years of education: Not on file  . Highest education level: Not on file  Occupational History  . Not on file  Tobacco Use  . Smoking status: Former Smoker    Packs/day: 0.50    Years: 30.00    Pack years: 15.00    Types: Cigarettes    Quit date: 10/19/2018    Years since quitting: 1.0  . Smokeless tobacco: Never Used  Substance and Sexual Activity  . Alcohol use: Yes    Alcohol/week: 13.0 - 14.0 standard drinks    Types: 12 Cans of beer, 1 - 2 Shots of liquor per week    Comment: 1-2 times per week.  . Drug use: No  . Sexual activity: Not on file  Other Topics Concern  . Not on file  Social History Narrative  . Not on file   Social Determinants of Health   Financial  Resource Strain:   . Difficulty of Paying Living Expenses:   Food Insecurity:   . Worried About Charity fundraiser in the Last Year:   . Arboriculturist in the Last Year:   Transportation Needs: No Transportation Needs  . Lack of Transportation (Medical): No  . Lack of Transportation (Non-Medical): No  Physical Activity:   . Days of Exercise per Week:   . Minutes of Exercise per Session:   Stress:   . Feeling of Stress :   Social Connections:   . Frequency of Communication with Friends and Family:   . Frequency of Social Gatherings with Friends and Family:   . Attends Religious Services:   . Active Member of Clubs or Organizations:   . Attends Archivist Meetings:   Marland Kitchen Marital Status:    Family History  Problem Relation Age of Onset  . Stroke Father     ASSESSMENT Recent Results: The most recent result is correlated with 55 mg per week: Lab Results  Component Value Date   INR 2.7 10/23/2019   INR 1.5 (A) 10/09/2019   INR 1.3 (H) 09/24/2019   PROTIME 28.8 (H) 02/04/2015    Anticoagulation Dosing: Description   Take one (1) tablet of your 10mg -white warfarin tablets on Mondays, Tuesdays,  Wednesdays and Fridays. All OTHER days--take only one-half (1/2) of your 10mg -white warfarin tablets.      INR today: Therapeutic  PLAN Weekly dose was unchanged, will remain on 55mg  warfarin/wk.  week  Patient Instructions  Patient instructed to take medications as defined in the Anti-coagulation Track section of this encounter.  Patient instructed to take today's dose.  Patient instructed to take one (1) tablet of your 10mg -white warfarin tablets on Mondays, Tuesdays,  Wednesdays and Fridays. All OTHER days--take only one-half (1/2) of your 10mg -white warfarin tablets.  Patient verbalized understanding of these instructions.    Patient advised to contact clinic or seek medical attention if signs/symptoms of bleeding or thromboembolism occur.  Patient verbalized  understanding by repeating back information and was advised to contact me if further medication-related questions arise. Patient was also provided an information handout.  Follow-up Return in 3 weeks (on 11/13/2019) for Follow up INR.  Pennie Banter, PharmD, CPP  15 minutes spent face-to-face with the patient during the encounter. 50% of time spent on education, including signs/sx bleeding and clotting, as well as food and drug interactions with warfarin. 50% of time was spent on fingerprick POC INR sample collection,processing, results determination, and documentation in http://www.kim.net/.

## 2019-10-23 NOTE — Therapy (Signed)
[place], [place], [address] Phone: [phone]   Fax:  [phone]  Physical Therapy Treatment  Patient Details  Name: [name] [name] MRN: [id number] Date of Birth: 1961-01-11 Referring Provider (PT): Reita May Date: 10/23/2019  PT End of Session - 10/23/19 1616    Visit Number  21    Number of Visits  24    Date for PT Re-Evaluation  11/03/19    Authorization Type  pending medicaid approval- 3 visits per 30 day auth; 09/28/19-10/25/19 for 8 visits    Authorization - Visit Number  3    Authorization - Number of Visits  8    PT Start Time  [id number]    PT Stop Time  1710    PT Time Calculation (min)  55 min    Activity Tolerance  Patient tolerated treatment well    Behavior During Therapy  [facility] for tasks assessed/performed       Past Medical History:  Diagnosis Date  . 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 Benign essential HTN 01/04/2012  . Cancer ([place])    tonsilar  . Chronic anticoagulation 01/02/2013  . Dermatofibroma of forearm 01/02/2013   Left side  . Hyperlipidemia, mixed 01/04/2012  . 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 Primary hypercoagulable state ([place]) 10/01/2014  . Sinus bradycardia, chronic 01/04/2012  . Superficial thrombosis of lower extremity 05/02/2012    Past Surgical History:  Procedure Laterality Date  . DIRECT LARYNGOSCOPY Left 10/19/2018   Procedure: DIRECT LARYNGOSCOPY;  Surgeon: Leta Baptist, MD;  Location: [place];  Service: ENT;  Laterality: Left;  . IR IMAGING GUIDED PORT INSERTION  11/04/2018  . IR REMOVAL TUN ACCESS W/ PORT W/O FL MOD SED  05/01/2019  . TONSILLECTOMY Left 10/19/2018   Procedure: BIOPSY OF LEFT TONSIL;  Surgeon: Leta Baptist, MD;  Location: [place];   Service: ENT;  Laterality: Left;    There were no vitals filed for this visit.  Subjective Assessment - 10/23/19 1616    Subjective  Pt states that he feels about the same.    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  Yes    Pain Score  2     Pain Location  Neck    Pain Orientation  Left;Anterior    Pain Descriptors / Indicators  Aching    Pain Type  Chronic pain    Pain Radiating Towards  toward back of ear    Pain Onset  More than a month ago    Pain Frequency  Constant    Aggravating Factors   swallowing                       [facility] Adult PT Treatment/Exercise - 10/23/19 0001      Neck Exercises: Supine   Other Supine Exercise  Tongue pressed to the roof ot he mouth 10x 10 second holds with improvement in mouth opening following exercise.     Other Supine Exercise  Isometric contract/relax of the pterygoid medialis  and masseter muscles with isometric closing 10 seconds followed by stretching into open by therapist 10 seconds 10x with improve opening. Quick stretch at end rnage opening 10x with good results including significant opening but results did not carry over into next mouth opening.       Manual Therapy   Manual Therapy  Joint mobilization;Myofascial release;Soft tissue mobilization;Manual Lymphatic Drainage (MLD);Passive ROM    Joint Mobilization  Side to side with palm at maxillary angle to the R 3x 10 seconds stretch w/oscillations    Soft tissue mobilization  with thick massage cream to tight tissue at left side of neck with prolonged pressure at trigger points then in supine to the posterior cervical musles and suboccipitals.    Manual Lymphatic Drainage (MLD)  with head elevated ~60 degrees to promote lympatic flow short neck, worked Bil lateral  cervical R/L from mandible to axilla, worked Horticulturist, commercial with stationary circles at the submandibular fossa, swimming in the terminus 2x     Passive ROM  with thumbs on both sides on lower teeth , passive stretch to mouth opening 5 reps x 5 sec. Pt with increased opening after stretch and then in supine cervical ROM rotation, retraction/protraction and side bending                  PT Long Term Goals - 10/23/19 1711      PT LONG TERM GOAL #1   Title  Pt will be able to independently manage lymphedema through self MLD and compression garments    Baseline  Pt has been using compression garment and flexitouch at home.    Status  Achieved      PT LONG TERM GOAL #2   Title  Pt will receive appropriate compression garments for long term management of lymphedema    Baseline  pt wears his compression daily from the moment he gets up until lunch time.    Status  Achieved      PT LONG TERM GOAL #3   Title  Pt will reduce swelling at 8 cm superior to sternal notch by 2 cm to decrease risk of swelling.    Status  On-going      PT LONG TERM GOAL #4   Title  Pt to demonstrate 30 mm of jaw opening to allow pt to eat food.    Baseline  15 mm 10/23/2019    Status  On-going      PT LONG TERM GOAL #5   Title  Pt to demonstrate 8 cm of bilateral lateral jaw excursion to allow pt to return to PLOF    Baseline  R 7 mm, L 31mm    Status  On-going      PT LONG TERM GOAL #6   Title  Pt will repot a 50% decrease in jaw pain to allow pt to return to prior level of function and be able to eat.    Status  On-going            Plan - 10/23/19 1616    Clinical Impression Statement  Pt continues with decreased depression of hte mandible that improves following very short amount of exercise. He has poor exercursion of the jaw as well. Since 1 month ago he has had very little progress carry over in depression of the mandible. Discussed with pt the importance of working on stretches throughout the  day. He continues very tender to touch through the L side of the cervcial spine and jaw that  only slightly improves with light STM; this tenerness is causing him to maintain a rotated side bend toward the R. Pt had significant opening of the mouth with quick stretch this session at end of depression of the mandible but was unable to carry over from one repetition to the next. Pt will benefit from continued POC at this time. Pt may be on hold until he finished at the hyperbaric chamber due to schedule.    Personal Factors and Comorbidities  Comorbidity 1    Comorbidities  history of multiple DVT and PE    Rehab Potential  Good    PT Frequency  Other (comment)    PT Duration  4 weeks    PT Treatment/Interventions  ADLs/Self Care Home Management;Therapeutic exercise;Manual lymph drainage;Manual techniques;Compression bandaging;Taping;Therapeutic activities;Patient/family education;Passive range of motion;Joint Manipulations    PT Next Visit Plan  Next session Re-authorization for Medicaid pt may be on hold due ot hyperbaric schedule. Goals were looked at this session. work on Theatre manager relax with masseter/medial pterygoid next session, continue with myofascial relase to head and neck,  Jaw massage and mobilization,  MLD to head and neck, try tongue to roof of mouth, jaw retraction/protraction and cervical retraction.    PT Home Exercise Plan  wear head and neck garment, self MLD, neck and jaw ROM exercises trying TENS with this    Consulted and Agree with Plan of Care  Patient       Patient will benefit from skilled therapeutic intervention in order to improve the following deficits and impairments:  Postural dysfunction, Decreased strength, Increased edema, Decreased knowledge of precautions, Pain, Increased fascial restricitons, Decreased range of motion, Increased muscle spasms  Visit Diagnosis: Disorder of the skin and subcutaneous tissue related to radiation, unspecified  Lymphedema,  not elsewhere classified  Abnormal posture  Dysphagia, unspecified type     Problem List Patient Active Problem List   Diagnosis Date Noted  . Hypokalemia 08/21/2019  . Warfarin anticoagulation 02/20/2019  . Hypercholesteremia 02/07/2019  . Recurrent deep vein thrombosis (DVT) ([place]) 02/06/2019  . Thoracic ascending aortic aneurysm ([place]) 02/06/2019  . Port-A-Cath in place 01/18/2019  . Mucositis due to chemotherapy 12/07/2018  . Hypomagnesemia 12/07/2018  . Thrush 12/07/2018  . Malignant neoplasm of tonsillar fossa ([place]) 10/17/2018  . 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 DVT, lower extremity, proximal ([place]) 05/24/2013  . Chronic anticoagulation 01/02/2013  . Superficial thrombosis of lower extremity 05/02/2012  . Benign essential HTN 01/04/2012  . Hyperlipidemia, mixed 01/04/2012    Ander Purpura, PT 10/23/2019, 5:23 PM  [place], [place], [address] Phone: [phone]   Fax:  [phone]  Name: [name] [name] MRN: [id number] Date of Birth: [birth year]

## 2019-10-24 ENCOUNTER — Encounter: Payer: Medicaid Other | Attending: Physician Assistant | Admitting: Physician Assistant

## 2019-10-24 DIAGNOSIS — I10 Essential (primary) hypertension: Secondary | ICD-10-CM | POA: Diagnosis not present

## 2019-10-24 DIAGNOSIS — L599 Disorder of the skin and subcutaneous tissue related to radiation, unspecified: Secondary | ICD-10-CM | POA: Insufficient documentation

## 2019-10-24 DIAGNOSIS — Z9221 Personal history of antineoplastic chemotherapy: Secondary | ICD-10-CM | POA: Insufficient documentation

## 2019-10-24 DIAGNOSIS — Z923 Personal history of irradiation: Secondary | ICD-10-CM | POA: Diagnosis not present

## 2019-10-24 DIAGNOSIS — F17218 Nicotine dependence, cigarettes, with other nicotine-induced disorders: Secondary | ICD-10-CM | POA: Diagnosis not present

## 2019-10-24 DIAGNOSIS — C099 Malignant neoplasm of tonsil, unspecified: Secondary | ICD-10-CM | POA: Insufficient documentation

## 2019-10-24 DIAGNOSIS — L598 Other specified disorders of the skin and subcutaneous tissue related to radiation: Secondary | ICD-10-CM | POA: Diagnosis not present

## 2019-10-25 NOTE — Progress Notes (Signed)
[name] [name], [name] [name] ([id number]) Visit Report for 10/24/2019 Abuse/Suicide Risk Screen Details Patient Name: [name] [name], [name] [name] Date of Service: 10/24/2019 1:00 PM Medical Record Number: [id number] Patient Account Number: [id number] Date of Birth/Sex: June 20, 1961 (59 y.o. M) Treating RN: Cornell Barman Primary Care Minnette Merida: [name] Other Clinician: Referring Devonte Migues: Eppie Gibson Treating Daisha Filosa/Extender: Melburn Hake, HOYT Weeks in Treatment: 0 Abuse/Suicide Risk Screen Items Answer ABUSE RISK SCREEN: Has anyone close to you tried to hurt or harm you recentlyo No Do you feel uncomfortable with anyone in your familyo No Has anyone forced you do things that you didnot want to doo No Electronic Signature(s) Signed: 10/24/2019 5:15:59 PM By: Gretta Cool, BSN, RN, CWS, Kim RN, BSN Entered By: Gretta Cool, BSN, RN, CWS, Kim on 10/24/2019 13:35:32 [name] [name] ([id number]) -------------------------------------------------------------------------------- Activities of Daily Living Details Patient Name: [name] [name] Date of Service: 10/24/2019 1:00 PM Medical Record Number: [id number] Patient Account Number: [id number] Date of Birth/Sex: 22-Jul-1960 (58 y.o. M) Treating RN: Cornell Barman Primary Care Jacorey Donaway: [name] Other Clinician: Referring Seraphine Gudiel: Eppie Gibson Treating Breeona Waid/Extender: Melburn Hake, HOYT Weeks in Treatment: 0 Activities of Daily Living Items Answer Activities of Daily Living (Please select one for each item) Drive Automobile Completely Able Take Medications Completely Able Use Telephone Completely Able Care for Appearance Completely Able Use Toilet Completely Able Bath / Shower Completely Able Dress Self Completely Able Feed Self Completely Able Walk Completely Able Get In / Out Bed Completely Able Housework Completely Able Prepare Meals Completely [place] for Self Completely Able Electronic Signature(s) Signed: 10/24/2019 5:15:59 PM  By: Gretta Cool, BSN, RN, CWS, Kim RN, BSN Entered By: Gretta Cool, BSN, RN, CWS, Kim on 10/24/2019 13:35:57 [name] [name] ([id number]) -------------------------------------------------------------------------------- Education Screening Details Patient Name: [name] [name] Date of Service: 10/24/2019 1:00 PM Medical Record Number: [id number] Patient Account Number: [id number] Date of Birth/Sex: Jul 23, 1960 (58 y.o. M) Treating RN: Cornell Barman Primary Care Emmogene Simson: [name] Other Clinician: Referring Exodus Kutzer: Eppie Gibson Treating Estephanie Hubbs/Extender: Sharalyn Ink in Treatment: 0 Primary Learner Assessed: Patient Learning Preferences/Education Level/Primary Language Learning Preference: Explanation Highest Education Level: College or Above Preferred Language: English Knowledge/Comprehension Knowledge Level: High Comprehension Level: High Ability to understand written instructions: High Ability to understand verbal instructions: High Motivation Anxiety Level: Calm Cooperation: Cooperative Education Importance: Acknowledges Need Interest in Health Problems: Uninterested Perception: Coherent Willingness to Engage in Self-Management High Activities: Readiness to Engage in Self-Management High Activities: Engineer, maintenance) Signed: 10/24/2019 5:15:59 PM By: Gretta Cool, BSN, RN, CWS, Kim RN, BSN Entered By: Gretta Cool, BSN, RN, CWS, Kim on 10/24/2019 13:36:25 [name] [name], [name] [name] ([id number]) -------------------------------------------------------------------------------- Fall Risk Assessment Details Patient Name: [name] [name] Date of Service: 10/24/2019 1:00 PM Medical Record Number: [id number] Patient Account Number: [id number] Date of Birth/Sex: 01-19-1961 (58 y.o. M) Treating RN: Cornell Barman Primary Care Alson Mcpheeters: [name] Other Clinician: Referring Shalla Bulluck: Eppie Gibson Treating Skylier Kretschmer/Extender: Melburn Hake, HOYT Weeks in Treatment: 0 Fall Risk Assessment Items Have  you had 2 or more falls in the last 12 monthso 0 No Have you had any fall that resulted in injury in the last 12 monthso 0 No FALLS RISK SCREEN History of falling - immediate or within 3 months 0 No Secondary diagnosis (Do you have 2 or more medical diagnoseso) 0 No Ambulatory aid None/bed rest/wheelchair/nurse 0 Yes Crutches/cane/walker 0 No Furniture 0 No Intravenous therapy Access/Saline/Heparin Lock 0 No Gait/Transferring Normal/ bed rest/ wheelchair 0 Yes Weak (short steps with or without shuffle, stooped but able to lift  head while walking, may seek 0 No support from furniture) Impaired (short steps with shuffle, may have difficulty arising from chair, head down, impaired 0 No balance) Mental Status Oriented to own ability 0 Yes Electronic Signature(s) Signed: 10/24/2019 5:15:59 PM By: Gretta Cool, BSN, RN, CWS, Kim RN, BSN Entered By: Gretta Cool, BSN, RN, CWS, Kim on 10/24/2019 13:36:40 [name] [name] ([id number]) -------------------------------------------------------------------------------- Foot Assessment Details Patient Name: [name] [name] Date of Service: 10/24/2019 1:00 PM Medical Record Number: [id number] Patient Account Number: [id number] Date of Birth/Sex: [birth year] (58 y.o. M) Treating RN: Cornell Barman Primary Care Fatima Fedie: [name] Other Clinician: Referring Arvind Mexicano: Eppie Gibson Treating Soleil Mas/Extender: Melburn Hake, HOYT Weeks in Treatment: 0 Foot Assessment Items Site Locations + = Sensation present, - = Sensation absent, C = Callus, U = Ulcer R = Redness, W = Warmth, M = Maceration, PU = Pre-ulcerative lesion F = Fissure, S = Swelling, D = Dryness Assessment Right: Left: Other Deformity: No No Prior Foot Ulcer: No No Prior Amputation: No No Charcot Joint: No No Ambulatory Status: Ambulatory Without Help Gait: Steady Electronic Signature(s) Signed: 10/24/2019 5:15:59 PM By: Gretta Cool, BSN, RN, CWS, Kim RN, BSN Entered By: Gretta Cool, BSN, RN, CWS, Kim on  10/24/2019 13:38:29 [name] [name] ([id number]) -------------------------------------------------------------------------------- Nutrition Risk Screening Details Patient Name: [name] [name] Date of Service: 10/24/2019 1:00 PM Medical Record Number: [id number] Patient Account Number: [id number] Date of Birth/Sex: Dec 10, 1960 (58 y.o. M) Treating RN: Cornell Barman Primary Care Dvaughn Fickle: [name] Other Clinician: Referring Orlene Salmons: Eppie Gibson Treating Montzerrat Brunell/Extender: Melburn Hake, HOYT Weeks in Treatment: 0 Height (in): 75 Weight (lbs): 187 Body Mass Index (BMI): 23.4 Nutrition Risk Screening Items Score Screening NUTRITION RISK SCREEN: I have an illness or condition that made me change the kind and/or amount of food I eat 2 Yes I eat fewer than two meals per day 3 Yes I eat few fruits and vegetables, or milk products 2 Yes I have three or more drinks of beer, liquor or wine almost every day 0 No I have tooth or mouth problems that make it hard for me to eat 2 Yes I don't always have enough money to buy the food I need 0 No I eat alone most of the time 0 No I take three or more different prescribed or over-the-counter drugs a day 1 Yes Without wanting to, I have lost or gained 10 pounds in the last six months 2 Yes I am not always physically able to shop, cook and/or feed myself 0 No Nutrition Protocols Good Risk Protocol Moderate Risk Protocol High Risk Proctocol 0 Provide education on nutrition Risk Level: High Risk Score: 12 Notes Liquid Diet-lost 50 pounds Electronic Signature(s) Signed: 10/24/2019 5:15:59 PM By: Gretta Cool, BSN, RN, CWS, Kim RN, BSN Entered By: Gretta Cool, BSN, RN, CWS, Kim on 10/24/2019 13:38:10

## 2019-10-25 NOTE — Progress Notes (Addendum)
[name] [name] ([id number]) Visit Report for 10/24/2019 Chief Complaint Document Details Patient Name: [name] [name], [name] [name] Date of Service: 10/24/2019 1:00 PM Medical Record Number: [id number] Patient Account Number: [id number] Date of Birth/Sex: 20-Apr-1961 (59 y.o. M) Treating RN: Montey Hora Primary Care Provider: [name] Other Clinician: Referring Provider: Eppie Gibson Treating Provider/Extender: Sharalyn Ink in Treatment: 0 Information Obtained from: Patient Chief Complaint Soft tissue radionecrosis Electronic Signature(s) Signed: 10/24/2019 1:22:18 PM By: [name] PA-C Previous Signature: 10/24/2019 1:13:40 PM Version By: [name] PA-C Entered By: [name] on 10/24/2019 13:22:17 [name] [name] ([id number]) -------------------------------------------------------------------------------- HPI Details Patient Name: [name] [name] Date of Service: 10/24/2019 1:00 PM Medical Record Number: [id number] Patient Account Number: [id number] Date of Birth/Sex: [birth year] (58 y.o. M) Treating RN: Montey Hora Primary Care Provider: [name] Other Clinician: Referring Provider: Eppie Gibson Treating Provider/Extender: Melburn Hake, Diannah Rindfleisch Weeks in Treatment: 0 History of Present Illness HPI Description: 10/24/2019 patient actually presents today for initial evaluation here in our clinic concerning issues that he has been having with a significant issue concerning his left tonsil region. 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 He underwent treatment April 2020 through June 2020 which was definitive chemoradiation with weekly cisplatin. On September 2020 the end of treatment PET scan showed decrease in FDG avidity in the left tonsil but some residual soft tissue fullness but resolution of the left elbow in disease the patient does have a history of paroxysmal atrial fibrillation, peripheral vascular disease, and tobacco and  ethanol abuse. He has seen ENT at [facility] and subsequently has undergone biopsy which most recently was on 10/02/2019 which showed paucicellular necrotic debris with abundant bacterial organisms. This was negative for fungal organisms. It appears therefore that the majority of the sloughing off and white debris noted in the oral cavity is necrotic debris/tissue that is sloughing off. There is no signs of active thrush and again this was on October 02, 2019 but the last biopsy was done. The patient did have a CT angiogram previous on 12/23/2018. 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 The study was somewhat limited in its full scope unfortunately. 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 There was occlusion of the proximal left internal iliac artery with a probable aneurysm in the left internal iliac artery measuring 1.9 cm. He had an abdominal aortic aneurysm measuring 3 cm. Nonetheless this is somewhat suggestive of some peripheral vascular issues and in general I am more concerned about vascular issues with regard to the heart from the standpoint of hyperbaric oxygen therapy. The patient also with a history of atrial fibrillation tells me he has seen a cardiologist though not sure who it is that he saw. He was somewhat of a poor historian in this regard unfortunately. The majority of the information that I obtained was from records that we had obtained from various sources. He unfortunately is not able to fully open his mouth and has significant discomfort he is also lost weight due to not being able to eat. He is still apparently smoking. The patient also has hypertension.  According to record review it does appear that there  is no residual malignancy noted according to the note from the [place] dated 10/18/2019. 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 He subsequently was therefore referred to me for consideration of hyperbaric oxygen therapy. He will also continue with ENT surveillance as well as shoulder and gentle exercises to improve range of motion according to the note. Electronic Signature(s) Signed: 10/27/2019 4:00:06 PM By: [name] PA-C Entered By: [name] on 10/27/2019 16:00:06 [name] [name], [name] [name] ([id number]) -------------------------------------------------------------------------------- Physical Exam Details Patient Name: [name] [name] Date of Service: 10/24/2019 1:00 PM Medical Record Number: [id number] Patient Account Number: [id number] Date of Birth/Sex: [birth year] (59 y.o. M) Treating RN: Montey Hora Primary Care Provider: [name] Other Clinician: Referring Provider: Eppie Gibson Treating Provider/Extender: Melburn Hake, Vencent Hauschild Weeks in Treatment: 0 Constitutional sitting or standing blood pressure is within target range for patient.. heart rate is elevated which is normal I think for him. respirations regular, non- labored and within target range for patient.Marland Kitchen temperature within target range for patient.. Well-nourished and well-hydrated in no acute distress. Eyes conjunctiva clear no eyelid edema noted. pupils equal round and reactive to light and accommodation. Ears, Nose, Mouth, and Throat no gross abnormality of ear auricles or external auditory canals. normal hearing noted during conversation. mucus membranes moist. Respiratory normal breathing without  difficulty. Cardiovascular regular rate and rhythm with normal S1, S2. Musculoskeletal normal gait and posture. no significant deformity or arthritic changes, no loss or range of motion, no clubbing. Psychiatric this patient is able to make decisions and demonstrates good insight into disease process. Alert and Oriented x 3. pleasant and cooperative thought the patient appears frustrated with his current situation. Notes Upon inspection today the patient obviously does not have a wound I did attempt to look in his mouth he is unable to really open significantly at this point unfortunately this is quite painful for him. He does have a lot of sloughy white tissue which again I would have assumed thrush except for the biopsy showed this was necrotic tissue and not fungal in nature. Obviously I would go with that assessment more so than just visual since it is obviously more complete. 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 Electronic Signature(s) Signed: 10/27/2019 4:10:18 PM By: [name] PA-C Entered By: [name] on 10/27/2019 16:10:17 [name] [name], [name] [name] ([id number]) -------------------------------------------------------------------------------- Physician Orders Details Patient Name: [name] [name] Date of Service: 10/24/2019 1:00 PM Medical Record Number: [id number] Patient Account Number: [id number] Date of Birth/Sex: 07/24/1960 (58 y.o. M) Treating RN: Montey Hora Primary Care Provider: [name] Other Clinician: Referring Provider: Eppie Gibson Treating Provider/Extender: Melburn Hake, Weylyn Ricciuti Weeks in Treatment: 0 Verbal / Phone Orders: No Diagnosis Coding ICD-10 Coding Code Description C09.9 Malignant neoplasm of tonsil, unspecified L59.9 Disorder of the skin and subcutaneous tissue related to radiation, unspecified F17.218 Nicotine dependence, cigarettes, with other nicotine-induced disorders I10 Essential (primary) hypertension Hyperbaric Oxygen  Therapy o Evaluate for HBO Therapy o Indication: - soft tissue radionecrosis o If appropriate for treatment, begin HBOT per protocol: o 2.0 ATA for 90 Minutes without Air Breaks o One treatment per day (delivered Monday through Friday unless otherwise specified in Special Instructions below): o Total # of Treatments: - 13 Consults o Cardiology - HBO Clearance Radiology o  X-ray, Chest Electronic Signature(s) Signed: 10/24/2019 4:31:26 PM By: Montey Hora Signed: 10/27/2019 10:31:16 AM By: [name] PA-C Entered By: Montey Hora on 10/24/2019 14:28:21 [name] [name], [name] [name] ([id number]) -------------------------------------------------------------------------------- Problem List Details Patient Name: [name] [name] Date of Service: 10/24/2019 1:00 PM Medical Record Number: [id number] Patient Account Number: [id number] Date of Birth/Sex: 21-May-1961 (59 y.o. M) Treating RN: Montey Hora Primary Care Provider: [name] Other Clinician: Referring Provider: Eppie Gibson Treating Provider/Extender: Melburn Hake, Marlyss Cissell Weeks in Treatment: 0 Active Problems ICD-10 Evaluated Encounter Code Description Active Date Today Diagnosis C09.9 Malignant neoplasm of tonsil, unspecified 10/24/2019 No Yes L59.9 Disorder of the skin and subcutaneous tissue related to radiation, 10/24/2019 No Yes unspecified F17.218 Nicotine dependence, cigarettes, with other nicotine-induced disorders 10/24/2019 No Yes I10 Essential (primary) hypertension 10/24/2019 No Yes Inactive Problems Resolved Problems Electronic Signature(s) Signed: 10/24/2019 1:46:39 PM By: [name] PA-C Previous Signature: 10/24/2019 1:20:43 PM Version By: [name] PA-C Previous Signature: 10/24/2019 1:13:34 PM Version By: [name] PA-C Entered By: [name] on 10/24/2019 13:46:38 [name] [name] ([id number]) -------------------------------------------------------------------------------- Progress Note  Details Patient Name: [name] [name] Date of Service: 10/24/2019 1:00 PM Medical Record Number: [id number] Patient Account Number: [id number] Date of Birth/Sex: [birth year] (58 y.o. M) Treating RN: Montey Hora Primary Care Provider: [name] Other Clinician: Referring Provider: Eppie Gibson Treating Provider/Extender: Melburn Hake, Zahira Brummond Weeks in Treatment: 0 Subjective Chief Complaint Information obtained from Patient Soft tissue radionecrosis History of Present Illness (HPI) 10/24/2019 patient actually presents today for initial evaluation here in our clinic concerning issues that he has been having with a significant issue concerning his left tonsil region. 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 He underwent treatment April 2020 through June 2020 which was definitive chemoradiation with weekly cisplatin. On September 2020 the end of treatment PET scan showed decrease in FDG avidity in the left tonsil but some residual soft tissue fullness but resolution of the left elbow in disease the patient does have a history of paroxysmal atrial fibrillation, peripheral vascular disease, and tobacco and ethanol abuse. He has seen ENT at [facility] and subsequently has undergone biopsy which most recently was on 10/02/2019 which showed paucicellular necrotic debris with abundant bacterial organisms. This was negative for fungal organisms. It appears therefore that the majority of the sloughing off and white debris noted in the oral cavity is necrotic debris/tissue that is sloughing off. There is no signs of active thrush and again this was on October 02, 2019 but the last biopsy was done. The patient did have a CT angiogram previous on 12/23/2018. This was notable for some fairly significant occlusions and peripheral vascular disease including the right SFA, the proximal and mid right popliteal artery which is occluded, probable aneurysms of the  right popliteal artery. The study was somewhat limited in its full scope unfortunately. 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 There was occlusion of the proximal left internal iliac artery with a probable aneurysm in the left internal iliac artery measuring 1.9 cm. He had an abdominal aortic aneurysm measuring 3 cm. Nonetheless this is somewhat suggestive of some peripheral vascular issues and in general I am more concerned about vascular issues with regard to the heart from the standpoint of hyperbaric oxygen therapy. The patient also with a history of  atrial fibrillation tells me he has seen a cardiologist though not sure who it is that he saw. He was somewhat of a poor historian in this regard unfortunately. The majority of the information that I obtained was from records that we had obtained from various sources. He unfortunately is not able to fully open his mouth and has significant discomfort he is also lost weight due to not being able to eat. He is still apparently smoking. The patient also has hypertension. According to record review it does appear that there is no residual malignancy noted according to the note from the [place] dated 10/18/2019. 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 He subsequently was therefore referred to me for consideration of hyperbaric oxygen therapy. He will also continue with ENT surveillance as well as shoulder and gentle exercises to improve range of motion according to the note. Patient History Information obtained from Patient. Allergies No Known Drug Allergies Family History No family  history of Cancer. Social History Current some day smoker, Marital Status - Divorced, Alcohol Use - Moderate, Drug Use - Prior History - marjuana, Caffeine Use - Never. Medical History Cardiovascular Patient has history of Arrhythmia, Hypertension Oncologic Patient has history of Received Chemotherapy - April-June 2020, Received Radiation - April-June 2020 Medical And Surgical History Notes Ear/Nose/Mouth/Throat Cancer of tonsil on left April 2020 Review of Systems (ROS) Respiratory Denies complaints or symptoms of Chronic or frequent coughs, Shortness of Breath. Cardiovascular Denies complaints or symptoms of Chest pain, LE edema. [name] [name], [name] [name] ([id number]) Gastrointestinal Denies complaints or symptoms of Frequent diarrhea, Nausea, Vomiting. Endocrine Denies complaints or symptoms of Hepatitis, Thyroid disease, Polydypsia (Excessive Thirst). Genitourinary Denies complaints or symptoms of Kidney failure/ Dialysis, Incontinence/dribbling. Immunological Denies complaints or symptoms of Hives, Itching. Integumentary (Skin) Denies complaints or symptoms of Wounds, Bleeding or bruising tendency, Breakdown, Swelling. Musculoskeletal Complains or has symptoms of Muscle Weakness. Denies complaints or symptoms of Muscle Pain. Neurologic Denies complaints or symptoms of Numbness/parasthesias, Focal/Weakness. Oncologic Tonsil Cancers Psychiatric Complains or has symptoms of Anxiety. Objective Constitutional sitting or standing blood pressure is within target range for patient.. heart rate is elevated which is normal I think for him. respirations regular, non- labored and within target range for patient.Marland Kitchen temperature within target range for patient.. Well-nourished and well-hydrated in no acute distress. Vitals Time Taken: 1:15 PM, Height: 75 in, Source: Stated, Weight: 187 lbs, Source: Measured, BMI: 23.4, Temperature: 97.8 F, Pulse: 112 bpm, Respiratory Rate: 16 breaths/min, Blood  Pressure: 113/80 mmHg. Eyes conjunctiva clear no eyelid edema noted. pupils equal round and reactive to light and accommodation. Ears, Nose, Mouth, and Throat no gross abnormality of ear auricles or external auditory canals. normal hearing noted during conversation. mucus membranes moist. Respiratory normal breathing without difficulty. Cardiovascular regular rate and rhythm with normal S1, S2. Musculoskeletal normal gait and posture. no significant deformity or arthritic changes, no loss or range of motion, no clubbing. Psychiatric this patient is able to make decisions and demonstrates good insight into disease process. Alert and Oriented x 3. pleasant and cooperative thought the patient appears frustrated with his current situation. General Notes: Upon inspection today the patient obviously does not have a wound I did attempt to look in his mouth he is unable to really open significantly at this  point unfortunately this is quite painful for him. He does have a lot of sloughy white tissue which again I would have assumed thrush except for the biopsy showed this was necrotic tissue and not fungal in nature. Obviously I would go with that assessment more so than just visual since it is obviously more complete. 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 At this point I do believe that the patient is a candidate for hyperbaric oxygen therapy. With that being said there are few things that we will need to check on first before proceeding with approval in this regard. First and foremost I do believe with his history of having recent pneumonia he needs to have a chest x-ray to evaluate for any residual lung issues. He tells me he has no issues with COPD, emphysema, or asthma initially and then he mentioned "well maybe I do have COPD". Nonetheless again the patient is somewhat of a poor historian I do want to get clearance before putting him into the chamber. 2. From a cardiovascular standpoint I explained the hyperbaric oxygen therapy can be considered moderate exercise with the stress that it can put the body through. For that reason due to his extensive cardiovascular and peripheral vascular history I do believe that he would need clearance from a cardiologist prior to going into the chamber in my opinion. Once we get clearance and they feel that he is safe to go into the chamber we can proceed with therapy. 3. I explained the regimen as well as showed him the chamber today and how things would proceed. Obviously he needs to be willing to go forward with the therapy which is good be 5 days a week and again he will be here for about roughly 2 to 2-1/2 hours each day. He voiced understanding of this and states that something he can do he really has no choice as there are no other treatment options here. We will work on getting approval and clearance through all sources that we need and then subsequently will work on getting the patient scheduled as soon as we can get everything approved. He is in agreement with that plan. Electronic Signature(s) Signed: 10/27/2019 4:12:56 PM By: [name] PA-C Entered By: [name] on 10/27/2019 16:12:56 [name] [name], [name] [name] ([id number]) -------------------------------------------------------------------------------- ROS/PFSH Details Patient Name: [name] [name] Date of Service: 10/24/2019 1:00 PM Medical Record Number: [id number] Patient Account Number: [id number] Date of Birth/Sex: 1960-12-05 (58 y.o. M) Treating RN: Cornell Barman Primary Care Provider: [name] Other Clinician: Referring Provider: Eppie Gibson Treating Provider/Extender: Melburn Hake, Shaneal Barasch Weeks in Treatment: 0 Information Obtained From Patient Respiratory Complaints and Symptoms: Negative for: Chronic or frequent coughs; Shortness of Breath Cardiovascular Complaints and Symptoms: Negative for: Chest pain; LE edema Medical History: Positive for: Arrhythmia; Hypertension Gastrointestinal Complaints and Symptoms: Negative for: Frequent diarrhea; Nausea; Vomiting Endocrine Complaints and Symptoms: Negative for: Hepatitis; Thyroid disease; Polydypsia (  Excessive Thirst) Genitourinary Complaints and Symptoms: Negative for: Kidney failure/ Dialysis; Incontinence/dribbling Immunological Complaints and Symptoms: Negative for: Hives; Itching Integumentary (Skin) Complaints and Symptoms: Negative for: Wounds; Bleeding or bruising tendency; Breakdown; Swelling Musculoskeletal Complaints and Symptoms: Positive for: Muscle Weakness Negative for: Muscle Pain Neurologic Complaints and Symptoms: Negative for: Numbness/parasthesias; Focal/Weakness Psychiatric Complaints and Symptoms: Positive for: Anxiety Ear/Nose/Mouth/Throat [name] [name], [name] [name] ([id number]) Medical History: Past Medical History Notes: Cancer of tonsil on left April 2020 Hematologic/Lymphatic Oncologic Complaints and Symptoms: Review of System Notes: Tonsil Cancers Medical History: Positive for: Received Chemotherapy - April-June 2020; Received Radiation - April-June 2020 Immunizations Pneumococcal Vaccine: Received Pneumococcal Vaccination:  No Implantable Devices None Family and Social History Cancer: No; Current some day smoker; Marital Status - Divorced; Alcohol Use: Moderate; Drug Use: Prior History - marjuana; Caffeine Use: Never Electronic Signature(s) Signed: 10/24/2019 5:15:59 PM By: Gretta Cool, BSN, RN, CWS, Kim RN, BSN Signed: 10/27/2019 10:31:16 AM By: [name] PA-C Entered By: Gretta Cool BSN, RN, CWS, Kim on 10/24/2019 13:35:23 [name] [name], [name] [name] ([id number]) -------------------------------------------------------------------------------- [place] Details Patient Name: [name] [name] Date of Service: 10/24/2019 Medical Record Number: [id number] Patient Account Number: [id number] Date of Birth/Sex: [birth year] (58 y.o. M) Treating RN: Montey Hora Primary Care Provider: [name] Other Clinician: Referring Provider: Eppie Gibson Treating Provider/Extender: Melburn Hake, Sidrah Harden Weeks in Treatment: 0 Diagnosis Coding ICD-10 Codes Code Description C09.9 Malignant neoplasm of tonsil, unspecified L59.9 Disorder of the skin and subcutaneous tissue related to radiation, unspecified F17.218 Nicotine dependence, cigarettes, with other nicotine-induced disorders I10 Essential (primary) hypertension Facility Procedures CPT4 Code: [id number] Description: [id number] - WOUND CARE VISIT-LEV 3 EST PT Modifier: Quantity: 1 Physician Procedures CPT4 Code: [id number] Description: [id number] - WC PHYS LEVEL 4 - NEW PT Modifier: Quantity: 1 CPT4 Code: Description: ICD-10 Diagnosis Description C09.9 Malignant neoplasm of tonsil, unspecified L59.9 Disorder of the skin and subcutaneous tissue related to radiation, unspeci F17.218 Nicotine dependence, cigarettes, with other nicotine-induced disorders I10  Essential (primary) hypertension Modifier: fied Quantity: Electronic Signature(s) Signed: 10/27/2019 10:31:16 AM By: [name] PA-C Entered By: [name] on 10/24/2019 23:52:14

## 2019-10-25 NOTE — Progress Notes (Signed)
PRECILIANO, DELACY ([id number]) Visit Report for 10/24/2019 HBO Risk Assessment Details Patient Name: [name] [name], [name] [name] Date of Service: 10/24/2019 1:00 PM Medical Record Number: [id number] Patient Account Number: [id number] Date of Birth/Sex: 08-Dec-1960 (59 y.o. M) Treating RN: Cornell Barman Primary Care Ruchi Stoney: [name] Other Clinician: Referring Elfrieda Espino: Eppie Gibson Treating Aahil Fredin/Extender: Melburn Hake, HOYT Weeks in Treatment: 0 HBO Risk Assessment Items Answer Any "Yes" answers must be brought to the hyperbaric physician's attention. Breathing or Lung problemso No Currently use tobacco productso Yes Used tobacco products in the pasto Yes Heart problemso No Do you take water pills (diuretic)o No Diabeteso No Dialysiso No Eye problems like glaucomao No Ear problems or surgeryo No Sinus Problemso Yes Cancero Yes List the location: tonsil (left) Surgery(s) for cancero Yes Describe the surgery (s): tonsil removal Radiation therapy for cancero Yes List the location of the radiation therapy: left neck and jaw Last treatment for radiation therapyo 12/28/2018 Chemotherapy for cancero Yes Last treatment for chemotherapyo 12/28/2018 Confinement Anxiety (Claustrophobia- fear of confined places)o No Any medical implants/devices that are fully or partially implanted or attached to your bodyo No Pregnanto No Seizureso No Notes Smokes 1-2 cigarettes weekly Electronic Signature(s) Signed: 10/24/2019 5:15:59 PM By: Gretta Cool, BSN, RN, CWS, Kim RN, BSN Entered By: Gretta Cool, BSN, RN, CWS, Kim on 10/24/2019 13:42:19

## 2019-10-27 ENCOUNTER — Ambulatory Visit: Payer: Medicaid Other

## 2019-10-30 DIAGNOSIS — C024 Malignant neoplasm of lingual tonsil: Secondary | ICD-10-CM | POA: Diagnosis not present

## 2019-11-05 ENCOUNTER — Encounter: Payer: Self-pay | Admitting: Internal Medicine

## 2019-11-05 DIAGNOSIS — I712 Thoracic aortic aneurysm, without rupture: Secondary | ICD-10-CM

## 2019-11-05 DIAGNOSIS — I7121 Aneurysm of the ascending aorta, without rupture: Secondary | ICD-10-CM

## 2019-11-06 ENCOUNTER — Telehealth: Payer: Self-pay | Admitting: *Deleted

## 2019-11-06 NOTE — Telephone Encounter (Signed)
Please call and help patient arrange an ACC appt for cardiac clearance.

## 2019-11-06 NOTE — Telephone Encounter (Signed)
Returned call to patient [phone]) requesting HBO referral. Spoke with [name] (per patient request), patient is needing cardiac clearance for HBO at [facility]. Patient was given appointment for Wed. 4-21-021 @ 1:15pm.   They are not sure if needing just clearance or if needing to see a cardiology doctor. Call Gay Filler [phone]) at the wound center , left message with [name] to have Ms. Gay Filler to call our office first thing Wed. morning to clarify if needing to be seen by IMC/OPC or referral to cardiologist , we can call patient to cancel clinic appointment @ 1:15pm.  Called patient back and spoke again with [name].  They both are aware to hear back from [place] at to what Ms. [name] say.  Will let nurse know and to expect the call from Ms. Gay Filler.

## 2019-11-08 ENCOUNTER — Encounter: Payer: Self-pay | Admitting: Internal Medicine

## 2019-11-08 ENCOUNTER — Ambulatory Visit (HOSPITAL_COMMUNITY)
Admission: RE | Admit: 2019-11-08 | Discharge: 2019-11-08 | Disposition: A | Discharge status: Home / Self Care | Payer: Medicaid Other | Source: Ambulatory Visit | Attending: Physician Assistant | Admitting: Physician Assistant

## 2019-11-08 ENCOUNTER — Ambulatory Visit: Payer: Medicaid Other | Admitting: Internal Medicine

## 2019-11-08 ENCOUNTER — Telehealth: Payer: Self-pay

## 2019-11-08 ENCOUNTER — Other Ambulatory Visit (HOSPITAL_COMMUNITY): Payer: Self-pay | Admitting: Physician Assistant

## 2019-11-08 ENCOUNTER — Other Ambulatory Visit: Payer: Self-pay

## 2019-11-08 ENCOUNTER — Ambulatory Visit (HOSPITAL_COMMUNITY)
Admission: RE | Admit: 2019-11-08 | Discharge: 2019-11-08 | Disposition: A | Discharge status: Home / Self Care | Payer: Medicaid Other | Source: Ambulatory Visit | Attending: Internal Medicine | Admitting: Internal Medicine

## 2019-11-08 VITALS — BP 107/74 | HR 64 | Temp 97.8°F | Ht 75.0 in | Wt 189.6 lb

## 2019-11-08 DIAGNOSIS — Z7189 Other specified counseling: Secondary | ICD-10-CM | POA: Diagnosis not present

## 2019-11-08 DIAGNOSIS — R0789 Other chest pain: Secondary | ICD-10-CM | POA: Insufficient documentation

## 2019-11-08 DIAGNOSIS — J439 Emphysema, unspecified: Secondary | ICD-10-CM | POA: Diagnosis not present

## 2019-11-08 MED ORDER — METOPROLOL SUCCINATE ER 25 MG PO TB24: 25.0000 mg | ORAL_TABLET | Freq: Every day | ORAL | 0 refills | Status: DC

## 2019-11-08 NOTE — Progress Notes (Signed)
CC: encounter for cardiac risk counseling  HPI:  [name] is a 59 y.o. male with PMH below.  Today we will address encounter for cardiac risk counseling  Please see A&P for status of the patient's chronic medical conditions  Past Medical History:  Diagnosis Date  . 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 Benign essential HTN 01/04/2012  . 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 Chronic anticoagulation 01/02/2013  . Dermatofibroma of forearm 01/02/2013   Left side  . Hyperlipidemia, mixed 01/04/2012  . 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 Primary hypercoagulable state ([place]) 10/01/2014  . Sinus bradycardia, chronic 01/04/2012  . Superficial thrombosis of lower extremity 05/02/2012   Review of Systems:  ROS: Pulmonary: pt denies increased work of breathing, shortness of breath,  Cardiac: pt denies palpitations, chest pain,  Abdominal: pt denies abdominal pain, nausea, vomiting, or diarrhea   Physical Exam:  Vitals:   11/08/19 1340  BP: 107/74  Pulse: 64  Temp: 97.8 F (36.6 C)  TempSrc: Oral  SpO2: 99%  Weight: 189 lb 9.6 oz (86 kg)  Height: 6\' 3"  (1.905 m)   Cardiac: JVD flat, irregularly irregular with tachycardia, clear s1 and s2, no murmurs, rubs or gallops, no LE edema Pulmonary: CTAB, not in distress Abdominal: non distended abdomen, soft and nontender Psych: Alert, conversant, in good spirits   Social History   Socioeconomic History  . Marital status: Divorced    Spouse name: Not on file  . Number of children: 2  . Years of education: Not on file  . Highest education level: Not on file  Occupational History  . Not on file  Tobacco Use  . Smoking status: Former Smoker    Packs/day: 0.50    Years: 30.00    Pack years: 15.00    Types: Cigarettes    Quit date: 10/19/2018    Years since  quitting: 1.0  . Smokeless tobacco: Never Used  Substance and Sexual Activity  . Alcohol use: Yes    Alcohol/week: 13.0 - 14.0 standard drinks    Types: 12 Cans of beer, 1 - 2 Shots of liquor per week    Comment: 1-2 times per week.  . Drug use: No  . Sexual activity: Not on file  Other Topics Concern  . Not on file  Social History Narrative  . Not on file   Social Determinants of Health   Financial Resource Strain:   . Difficulty of Paying Living Expenses:   Food Insecurity:   . Worried About Charity fundraiser in the Last Year:   . Arboriculturist in the Last Year:   Transportation Needs:   . Film/video editor (Medical):   Marland Kitchen Lack of Transportation (Non-Medical):   Physical Activity:   . Days of Exercise per Week:   . Minutes of Exercise per Session:   Stress:   . Feeling of Stress :   Social Connections:   . Frequency of Communication with Friends and Family:   . Frequency of Social Gatherings with Friends and Family:   . Attends Religious Services:   . Active Member of Clubs or Organizations:   . Attends Archivist Meetings:   Marland Kitchen Marital Status:   Intimate Partner Violence:   . Fear of Current or Ex-Partner:   .  Emotionally Abused:   Marland Kitchen Physically Abused:   . Sexually Abused:     Family History  Problem Relation Age of Onset  . Stroke Father     Assessment & Plan:   See Encounters Tab for problem based charting.  Patient discussed with Dr. Angelia Mould

## 2019-11-08 NOTE — Progress Notes (Signed)
Internal Medicine Clinic Attending  Case discussed with Dr. Shan Levans at the time of the visit.  We reviewed the resident's history and exam and pertinent patient test results.  I agree with the assessment, diagnosis, and plan of care documented in the resident's note.   As noted patient has no known history of CAD, he does have peripheral vascular disease notably history of aneurysms.  I do not feel that cardiac stress testing or other evaluation is needed to further stratify him before hyperbaric oxygen given his exercise tolerance.  WE will clarify with wound care if they require a cardiologist evaluation otherwise I think we can safely forgo this.  He does however have paroxsymal afib. He is on anticoagulation but not on rhythm or rate control.  His HR is elevated, I would go ahead and stop amlodipine and start metoprolol for rate control.  He does have some emphysematous changes on previous pulmonary imaging without any known history of obstructive disease.

## 2019-11-08 NOTE — Telephone Encounter (Signed)
Agree will evaluate today for "cardiac clearance"/ medical optimization prior to hyperbaric oxygen therapy. Will obtain EKG.

## 2019-11-08 NOTE — Assessment & Plan Note (Addendum)
Has lost a lot of weight, does not have as much energy or endurance.  Can walk around the grocery store, walked from the parking lot to our clinic and doesn't have to stop.  He walks upstairs to take a shower at home and doesn't have to stop and rest does not feel "winded" afterwards.  Never has chest pain with exertion or otherwise.  He still smokes he is down to about one cigarette every other day.  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   Most of his vascular disease seems to be due to a hypercoagulable state.  He has never required bypass grafting of his peripheral arterial disease.  Recent CT chest with contrast, no calcifications seen in coronary arteries, does show mild paraseptal and centrilobular emyphysema.  No PFT's in chart.  Normal Nuclear stress testing without ischemia in 2005.  Last lipid panel 9 months ago TG's 321, LDL 98, HDL 37.  He has lost significant weight since then.  He has a subjective history of paroxysmal afib, last 2 ecgs over 2020 with a fib.  2013 was in NSR.  On lipitor 20mg  but he takes it only a few times per week.  His revised cardiac risk index score is 0 with a 3.9% risk of death, MI or cardiac arrest post procedure although this was intended for surgical cases.  No evidence exists regarding hyperbaric procedures.  ECG afib rate of 109 today otherwise no pertinent findings.    -Taken in aggregate, I feel patient is at low to intermediate risk and would be able to tolerate hyperbaric oxygen treatments currently.   -switch pt from amlodipine to metoprolol xl 25mg  -sent a message to wound care to discuss cardiology referral

## 2019-11-08 NOTE — Telephone Encounter (Signed)
TC received from [facility] at [facility], asking if patient was going to be seen at [facility] today for cardiac clearance.  Pt has an appt in [facility] at 1:15 (Per attending, Dr. Heber Ozark, pt to keep appt today for evaluation for cardiac clearance).  [place] also needs an EKG performed today.    If patient cleared, please fax clearance to [facility] , attn: Gay Filler at [phone] so they can proceed with insurance approval.  Pt called and appt confirmed for 1:15 today.  Thank you, SChaplin, RN,BSN

## 2019-11-08 NOTE — Patient Instructions (Signed)
[name] [name] we will stop amlodipine.  Please start metoprolol starting tomorrow.  We have cleared you for hyperbaric oxygen therapy.  We will keep your referral in place in case you are required to get cardiology approval.

## 2019-11-08 NOTE — Telephone Encounter (Signed)
Cardiology referral placed

## 2019-11-13 ENCOUNTER — Other Ambulatory Visit: Payer: Self-pay

## 2019-11-13 ENCOUNTER — Ambulatory Visit: Payer: Medicaid Other

## 2019-11-13 DIAGNOSIS — C090 Malignant neoplasm of tonsillar fossa: Secondary | ICD-10-CM

## 2019-11-13 NOTE — Progress Notes (Signed)
Patient left VM stating that he would now prefer to do wound care treatment in [facility] because [facility] did not have any afternoon appointments. Patient stated he spoke with DJ at [facility] who told patient they would be able to see patient, they just needed a new referral from Dr. Isidore Moos.   Called DJ [phone]) to see if he still had copies of initial paperwork faxed over from original referral, or if he needed new copies. DJ stated he didn't have anything on file on patient and would records from Dr. Isidore Moos, as well as a new referral placed in Epic. Referral placed, and EOT note as well as last MD visit note faxed to [phone]. Received confirmation that fax went through successfully.   Returned patient's call to let him know that new referral was placed and paper work was sent, so he should be hearing from DJ or his colleague later this week. Encouraged patient to call clinic back should he have any other questions/concerns. Patient verbalized understanding and agreement, and denied any other needs at this time.

## 2019-11-16 ENCOUNTER — Encounter (HOSPITAL_BASED_OUTPATIENT_CLINIC_OR_DEPARTMENT_OTHER): Payer: Medicaid Other | Admitting: Internal Medicine

## 2019-11-16 ENCOUNTER — Other Ambulatory Visit: Payer: Self-pay

## 2019-11-16 ENCOUNTER — Encounter (HOSPITAL_BASED_OUTPATIENT_CLINIC_OR_DEPARTMENT_OTHER): Payer: Self-pay

## 2019-11-18 DIAGNOSIS — C024 Malignant neoplasm of lingual tonsil: Secondary | ICD-10-CM | POA: Diagnosis not present

## 2019-11-23 ENCOUNTER — Ambulatory Visit: Payer: Medicaid Other | Attending: Radiation Oncology | Admitting: Physical Therapy

## 2019-11-23 ENCOUNTER — Other Ambulatory Visit: Payer: Self-pay

## 2019-11-23 ENCOUNTER — Encounter: Payer: Self-pay | Admitting: Physical Therapy

## 2019-11-23 DIAGNOSIS — R293 Abnormal posture: Secondary | ICD-10-CM | POA: Diagnosis not present

## 2019-11-23 DIAGNOSIS — L599 Disorder of the skin and subcutaneous tissue related to radiation, unspecified: Secondary | ICD-10-CM | POA: Insufficient documentation

## 2019-11-23 DIAGNOSIS — M542 Cervicalgia: Secondary | ICD-10-CM | POA: Insufficient documentation

## 2019-11-23 DIAGNOSIS — M6249 Contracture of muscle, multiple sites: Secondary | ICD-10-CM | POA: Diagnosis not present

## 2019-11-23 NOTE — Therapy (Signed)
[place], [place], [address] Phone: [phone]   Fax:  [phone]  Physical Therapy Re-Evaluation  Patient Details  Name: [name] [name] MRN: [id number] Date of Birth: 1961-01-02 Referring Provider (PT): Reita May Date: 11/23/2019  PT End of Session - 11/23/19 1440    Visit Number  22   visit 10 in 2021   Number of Visits  30    Date for PT Re-Evaluation  01/23/20    PT Start Time  1400    PT Stop Time  1445    PT Time Calculation (min)  45 min    Activity Tolerance  Patient tolerated treatment well    Behavior During Therapy  [facility] for tasks assessed/performed       Past Medical History:  Diagnosis Date  . 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 Benign essential HTN 01/04/2012  . 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 Chronic anticoagulation 01/02/2013  . Dermatofibroma of forearm 01/02/2013   Left side  . Hyperlipidemia, mixed 01/04/2012  . 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 Primary hypercoagulable state ([place]) 10/01/2014  . Sinus bradycardia, chronic 01/04/2012  . Superficial thrombosis of lower extremity 05/02/2012    Past Surgical History:  Procedure Laterality Date  . DIRECT LARYNGOSCOPY Left 10/19/2018   Procedure: DIRECT LARYNGOSCOPY;  Surgeon: Leta Baptist, MD;  Location: [facility];  Service: ENT;  Laterality: Left;  . IR IMAGING GUIDED PORT INSERTION  11/04/2018  . IR REMOVAL TUN ACCESS W/ PORT W/O FL MOD SED  05/01/2019  . TONSILLECTOMY Left 10/19/2018   Procedure: BIOPSY OF LEFT TONSIL;  Surgeon: Leta Baptist, MD;  Location: [facility];  Service: ENT;  Laterality: Left;    There were no vitals filed for this visit.   Subjective Assessment - 11/23/19 1412    Subjective  Pt states he has  not yet started the hyperbaric treatments.  He is waiting for approval from insurance.  He wants to come back to PT because he feels like the PT was helping him. He says he was doing well, but his jaw is stiffening up again. 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  Yes    Pain Score  2     Pain Location  Throat    Pain Orientation  Left;Anterior    Pain Descriptors / Indicators  Aching    Pain Type  Chronic pain    Pain Radiating Towards  sometimes radiates and he feels a nerve pain    Pain Onset  More than a month ago    Aggravating Factors   touch makes it worse    Pain Relieving Factors  advil,pt gets relief from PT         [facility] PT Assessment - 11/23/19 0001      Assessment  Medical Diagnosis  L tonsil squamous cell carcinoma    Referring Provider (PT)  Isidore Moos    Onset Date/Surgical Date  10/19/18      Prior Function   Level of Independence  Independent      Cognition   Overall Cognitive Status  Within Functional Limits for tasks assessed      Observation/Other Assessments   Observations  Pt walks into clinic without device and appears to be able to stand with better posture and better velocity. He has much less fullness in neck, but skin is still dark and appears to be very tight on left side of neck       Coordination   Gross Motor Movements are Fluid and Coordinated  Yes      Posture/Postural Control   Posture/Postural Control  Postural limitations    Postural Limitations  Rounded Shoulders;Forward head    Posture Comments  pt is able to correct posture with cues       AROM    Cervical Flexion  60    Cervical Extension  20   reqlly tight on the left side    Cervical - Right Side Bend  30    tightness on the left neck   Cervical - Left Side Bend  45    Jaw-Incisal Opening   13    Jaw-Left Lateral Excurison  5    Jaw-Right Lateral Excursion  5        LYMPHEDEMA/ONCOLOGY QUESTIONNAIRE - 11/23/19 1507      Type   Cancer Type  L tonsil squamous cell carcinoma      Date Lymphedema/Swelling Started   Date  02/18/19      Treatment   Active Chemotherapy Treatment  No    Past Chemotherapy Treatment  Yes    Active Radiation Treatment  No    Past Radiation Treatment  Yes      What other symptoms do you have   Are you Having Heaviness or Tightness  Yes    Are you having Pain  Yes    Are you having pitting edema  No    Is it Hard or Difficult finding clothes that fit  No    Do you have infections  No    Is there Decreased scar mobility  Yes      Head and Neck   4 cm superior to sternal notch around neck  39 cm    6 cm superior to sternal notch around neck  38.7 cm    8 cm superior to sternal notch around neck  40 cm             Objective measurements completed on examination: See above findings.                   PT Long Term Goals - 11/23/19 1419      PT LONG TERM GOAL #1   Title  Pt will be able to independently manage lymphedema through self MLD and compression garments    Baseline  Pt has been using compression garment and flexitouch at home, but still is has tightness, fullness and pain    Time  4    Period  Weeks    Status  On-going      PT LONG TERM GOAL #2   Title  Pt will receive appropriate compression garments for long term management of lymphedema    Status  Achieved      PT LONG TERM GOAL #3  Title  Pt will reduce swelling at 8 cm superior to sternal notch by 2 cm to decrease risk of swelling.    Baseline  50.2 cm, 04/25/19- 50.4 but rest of neck demonstrates around 1.5 cm reduction, 06/01/19: 49.1; 42.5 cm -  07/03/19  40 on 09/26/2019    Status  Achieved      PT LONG TERM GOAL #4   Title  Pt to demonstrate 30 mm of jaw opening to allow pt to eat food.    Baseline  15 mm 10/23/2019    Time  8    Period  Weeks    Status  On-going      PT LONG TERM GOAL #5   Title  Pt to demonstrate 8 cm of bilateral lateral jaw excursion to allow pt to return to PLOF    Baseline  R 7 mm, L 47mm at baseline    Time  8    Period  Weeks    Status  On-going      Additional Long Term Goals   Additional Long Term Goals  Yes      PT LONG TERM GOAL #6   Title  Pt will repot a 50% decrease in jaw pain to allow pt to return to prior level of function and be able to eat.    Baseline  Pt states he still has significant pain especailly to touch    Time  4    Period  Weeks    Status  On-going      PT LONG TERM GOAL #7   Title  Pt will increase cervical extension to 40 degrees to allow him to look up easier in functional tasks    Baseline  20 degrees on 11/23/2019    Time  8    Period  Weeks    Status  New             Plan - 11/23/19 1512    Clinical Impression Statement  Pt returns to PT as there has been a delay in getting the hyperbaric oxygen treatments started and he wants to continue PT as he feels he was making progress here.  He has a compression garment and Flexitouch at home and has made good improvment in reducing the lymphedema in his neck.  He still struggles very much with neck and TMJ tightness and pain and wants PT to help his stretch and loosen this so he can continue at home with stretches and his therabyte. Reauthorization request sent to [facility] and recert sent to Dr. Isidore Moos today    Personal Factors and Comorbidities  Comorbidity 2;Comorbidity 3+    Comorbidities  history of multiple DVT and PE, radiation fibrosis and radiatin necrosis    Stability/Clinical Decision Making  Evolving/Moderate complexity   pt will be having hyperbaric treatment   Clinical Decision Making  Moderate    Rehab  Potential  Good    PT Frequency  1x / week    PT Duration  8 weeks    PT Treatment/Interventions  ADLs/Self Care Home Management;Therapeutic exercise;Manual lymph drainage;Manual techniques;Compression bandaging;Taping;Therapeutic activities;Patient/family education;Passive range of motion;Joint Manipulations    PT Next Visit Plan  Continue to  work on chin tuck and neck extension  and contract relax with masseter/medial pterygoid next session, continue with myofascial relase to head and neck,  Jaw massage and mobilization,  MLD to head and neck, try tongue to roof of mouth, jaw retraction/protraction and cervical retraction.    Consulted and Agree  with Plan of Care  Patient       Patient will benefit from skilled therapeutic intervention in order to improve the following deficits and impairments:  Postural dysfunction, Decreased strength, Increased edema, Decreased knowledge of precautions, Pain, Increased fascial restricitons, Decreased range of motion, Increased muscle spasms  Visit Diagnosis: Disorder of the skin and subcutaneous tissue 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 Encounter for cardiac risk counseling 11/08/2019  . Hypokalemia 08/21/2019  . Warfarin anticoagulation 02/20/2019  . Hypercholesteremia 02/07/2019  . Recurrent deep vein thrombosis (DVT) ([place]) 02/06/2019  . Thoracic ascending aortic aneurysm ([place]) 02/06/2019  . Port-A-Cath in place 01/18/2019  . Mucositis due to chemotherapy 12/07/2018  . Hypomagnesemia 12/07/2018  . Thrush 12/07/2018  . Malignant neoplasm of tonsillar fossa ([place]) 10/17/2018  . 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 DVT, lower extremity, proximal ([place]) 05/24/2013  . Chronic anticoagulation 01/02/2013  . Superficial  thrombosis of lower extremity 05/02/2012  . Benign essential HTN 01/04/2012  . Hyperlipidemia, mixed 01/04/2012   [name]  Norwood Levo 11/23/2019, 3:28 PM  [place], [place], [address] Phone: [phone]   Fax:  [phone]  Name: [name] [name] MRN: [id number] Date of Birth: 07-14-1961

## 2019-11-24 ENCOUNTER — Other Ambulatory Visit: Payer: Self-pay | Admitting: *Deleted

## 2019-11-24 ENCOUNTER — Encounter (HOSPITAL_BASED_OUTPATIENT_CLINIC_OR_DEPARTMENT_OTHER): Payer: Medicaid Other | Admitting: Internal Medicine

## 2019-11-24 ENCOUNTER — Telehealth: Payer: Self-pay | Admitting: Adult Health Nurse Practitioner

## 2019-11-24 DIAGNOSIS — B370 Candidal stomatitis: Secondary | ICD-10-CM

## 2019-11-24 MED ORDER — CYCLOBENZAPRINE HCL 10 MG PO TABS: ORAL_TABLET | ORAL | 0 refills | Status: DC

## 2019-11-24 NOTE — Telephone Encounter (Signed)
Spoke with patient and SO, [name] [name].  Patient needing refills on oxycodone.  He has had hydrocodone in the past and has been using hydrocodone 10/325 mg 1 tab with advil during the day and using the oxycodone 5 mg at night.  Discussed this with nurse at Dr. [name] office and hydrocodone will be discontinued and this provider will refill his oxycodone.  Patient also needed refill on flexeril.  Dr. [name] office to refill flexeril.  Looked up patient on substance abuse database and has ORS of 390.  Escript for oxy 5mg  Q 8 hrs PRN for pain sent to [facility] in [place]. [name] Olena Heckle NP

## 2019-11-24 NOTE — Progress Notes (Signed)
Earlier this week called DJ ([phone]) at [facility] to see if patient had been scheduled for a wound care consult/treatment. DJ stated that patient called the previous week to see if his appointment on 11/24/19 could be moved up to 11/17/19. DJ moved the appointment and patient came in, but left before being assessed by doctor stating: "I have somewhere else I have to be. I can't wait anymore." DJ attempted to call patient to reschedule again, but when patient finally answered he told DJ that he would "call to reschedule when I have time". DJ told me patient has his direct line so he can call him directly when he's ready to make an appointment.   Checked patient's chart today and saw that he saw Milas Gain yesterday 11/23/19 for reevaluation. Her note states that he is "waiting to schedule wound care visit until insurance approval". Looked at patient's appointments and see that someone from [facility] has made patient another appointment with same provider for 12/07/2019. Per Dr. Pearlie Oyster request myself or Anderson Malta Malmfelt-H&N Navigator will reach out to patient closer to appointment date to encourage him to keep appointment and stay for full duration.

## 2019-11-30 ENCOUNTER — Telehealth: Payer: Self-pay | Admitting: Hematology

## 2019-11-30 NOTE — Telephone Encounter (Signed)
Faxed medical records to [facility] at [phone], Release ID: [id number]

## 2019-12-07 ENCOUNTER — Encounter (HOSPITAL_BASED_OUTPATIENT_CLINIC_OR_DEPARTMENT_OTHER): Payer: Medicaid Other | Admitting: Internal Medicine

## 2019-12-12 ENCOUNTER — Ambulatory Visit: Payer: Medicaid Other

## 2019-12-19 DIAGNOSIS — C024 Malignant neoplasm of lingual tonsil: Secondary | ICD-10-CM | POA: Diagnosis not present

## 2019-12-22 ENCOUNTER — Encounter (HOSPITAL_BASED_OUTPATIENT_CLINIC_OR_DEPARTMENT_OTHER): Payer: Medicaid Other | Attending: Internal Medicine | Admitting: Internal Medicine

## 2019-12-22 DIAGNOSIS — L598 Other specified disorders of the skin and subcutaneous tissue related to radiation: Secondary | ICD-10-CM | POA: Insufficient documentation

## 2019-12-22 DIAGNOSIS — C099 Malignant neoplasm of tonsil, unspecified: Secondary | ICD-10-CM | POA: Insufficient documentation

## 2019-12-22 DIAGNOSIS — M272 Inflammatory conditions of jaws: Secondary | ICD-10-CM | POA: Insufficient documentation

## 2019-12-26 NOTE — Progress Notes (Signed)
AUDY, DAUPHINE ([id number]) Visit Report for 12/22/2019 Chief Complaint Document Details Patient Name: Date of Service: [name] [name], [name] [name] 12/22/2019 1:15 PM Medical Record Number: [id number] Patient Account Number: [id number] Date of Birth/Sex: Treating RN: 04-Jul-1961 (59 y.o. Male) Kela Millin Primary Care Provider: [name] Other Clinician: Referring Provider: Treating Provider/Extender: [name] Weeks in Treatment: 0 Information Obtained from: Patient Chief Complaint 12/22/2019; patient is here for consideration of hyperbaric oxygen therapy for radiation induced injury to his oral cavity Electronic Signature(s) Signed: 12/25/2019 5:22:40 PM By: Linton Ham MD Entered By: Linton Ham on 12/22/2019 15:52:33 -------------------------------------------------------------------------------- HPI Details Patient Name: Date of Service: [name] [name], [name] [name] 12/22/2019 1:15 PM Medical Record Number: [id number] Patient Account Number: [id number] Date of Birth/Sex: Treating RN: September 17, 1960 (59 y.o. Male) Kela Millin Primary Care Provider: [name] Other Clinician: Referring Provider: Treating Provider/Extender: [name] Weeks in Treatment: 0 History of Present Illness HPI Description: [place]; 10/24/2019 HPI Description: 10/24/2019 patient actually presents today for initial evaluation here in our clinic concerning issues that he has been having with a significant issue concerning his left tonsil region. 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 He underwent treatment April 2020 through June 2020 which was definitive chemoradiation with weekly cisplatin. On September 2020 the end of treatment PET scan showed decrease in FDG avidity in the left tonsil but some residual soft tissue fullness but resolution of the left elbow in disease the patient does have a history of paroxysmal atrial  fibrillation, peripheral vascular disease, and tobacco and ethanol abuse. He has seen ENT at [facility] and subsequently has undergone biopsy which most recently was on 10/02/2019 which showed paucicellular necrotic debris with abundant bacterial organisms. This was negative for fungal organisms. It appears therefore that the majority of the sloughing off and white debris noted in the oral cavity is necrotic debris/tissue that is sloughing off. There is no signs of active thrush and again this was on October 02, 2019 but the last biopsy was done. The patient did have a CT angiogram previous on 12/23/2018. 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 The study was somewhat limited in its full scope unfortunately. 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 There was occlusion of the proximal left internal iliac artery with a probable aneurysm in the left internal iliac artery measuring 1.9 cm. He had an abdominal aortic aneurysm measuring 3 cm. Nonetheless this is somewhat suggestive of some peripheral vascular issues and in general I am more concerned about vascular issues with regard to the heart from the standpoint of hyperbaric oxygen therapy. The patient also with a history of atrial fibrillation tells me he has seen a cardiologist though not sure who it is that he saw. He was somewhat of a poor historian in this regard unfortunately. The majority of the information that I obtained was from records that we had obtained from various sources. He unfortunately is not able to fully open his mouth and has significant discomfort he is also lost weight due to not being able to eat. He is  still apparently smoking. The patient also has hypertension. According  to record review it does appear that there is no residual malignancy noted according to the note from the [place] dated 10/18/2019. 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 He subsequently was therefore referred to me for consideration of hyperbaric oxygen therapy. He will also continue with ENT surveillance as well as shoulder and gentle exercises to improve range of motion according to the note. ADMISSION 12/22/2019 This is a 59 year old man who is here for consideration of hyperbaric oxygen. He was seen on 10/24/2019 in our sister clinic in [place] also for consideration of hyperbaric oxygen although the timing of his daily dives could not be negotiated that time so he is here for reconsideration. He has a history of a squamous cell carcinoma of the lingual tonsil treated with radiation in 2020. He received 70 GY in 35 fractions from 11/08/2018 until 12/28/2018. He did fairly well until the beginning of this year when he developed trismus and severe pain in the left lower jaw. He had a CT scan that suggested more fullness question recurrent malignancy versus radial fibrosis. He was followed closely by ENT at [facility] and had biopsies in February of this year and then again in March none of which showed no recurrent cancer only tissue necrosis. A CT scan in March showed no substantive changes radiation therapy or residual tumor. 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 The patient currently can only open his mouth about a centimeter. He has constant pain in the left part of his jaw probably secondary to nerve damage. He is able to only chew soft food. He has lost  weight of 50 to 60 pounds. 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 Infectious disease thinks that this is probably noninfectious. He also has paroxysmal A. fib. The patient was seen in consultation by his primary doctor after he was in [place]. He was not felt to have any serious cardiopulmonary issue that needed further cardiac evaluation prior to initiating HBO. He had a chest x-ray on 4/21 that was normal, EKG showed atrial fibrillation heart rate 109, septal MI and left anterior fascicular block. He does not describe chest pain or exertional shortness of breath Electronic Signature(s) Signed: 12/25/2019 5:22:40 PM By: Linton Ham MD Entered By: Linton Ham on 12/25/2019 07:47:39 -------------------------------------------------------------------------------- Physical Exam Details Patient Name: Date of Service: [name] [name], [name] [name] 12/22/2019 1:15 PM Medical Record Number: [id number] Patient Account Number: [id number] Date of Birth/Sex: Treating RN: [birth year] (58 y.o. Male) Kela Millin Primary Care Provider: [name] Other Clinician: Referring Provider: Treating Provider/Extender: Birdena Crandall, RYLEE Weeks in Treatment: 0 Constitutional Sitting or standing Blood Pressure is within target range for patient.. Pulse regular and within target range for patient.Marland Kitchen Respirations regular, non-labored and within target range.. Temperature is normal and within the target range for the patient.Marland Kitchen Appears in no distress. Ears, Nose, Mouth, and Throat His tongue is coated I really cannot see it to the back of his oropharynx to get a look at the tonsillar fossa on the left. Respiratory work of  breathing is normal. Bilateral  breath sounds are clear and equal in all lobes with no wheezes, rales or rhonchi.. Cardiovascular Heart rhythm and rate regular, without murmur or gallop.Marland Kitchen Lymphatic None palpable in the cervical cervical chain.Marland Kitchen Psychiatric appears at normal baseline. Electronic Signature(s) Signed: 12/25/2019 5:22:40 PM By: Linton Ham MD Entered By: Linton Ham on 12/22/2019 16:10:40 -------------------------------------------------------------------------------- Physician Orders Details Patient Name: Date of Service: [name] [name], [name] [name] 12/22/2019 1:15 PM Medical Record Number: [id number] Patient Account Number: [id number] Date of Birth/Sex: Treating RN: 01/02/1961 (58 y.o. Male) Kela Millin Primary Care Provider: [name] Other Clinician: Referring Provider: Treating Provider/Extender: Birdena Crandall, RYLEE Weeks in Treatment: 0 Verbal / Phone Orders: No Diagnosis Coding Follow-up Appointments Other: - Will call you to let you know when to start HBO, will run through insurance for approval. Hyperbaric Oxygen Therapy Evaluate for HBO Therapy Indication: - Soft Tissue Necrosis If appropriate for treatment, begin HBOT per protocol: 2.5 ATA for 90 Minutes with 2 Five (5) Minute A Breaks ir Total Number of Treatments: - 40 One treatments per day (delivered Monday through Friday unless otherwise specified in Special Instructions below): Follow Hyperbaric Oxygen Glycemia Protocol A frin (Oxymetazoline HCL) 0.05% nasal spray - 1 spray in both nostrils daily as needed prior to HBO treatment for difficulty clearing ears GLYCEMIA INTERVENTIONS PROTOCOL PRE-HBO GLYCEMIA INTERVENTIONS ACTION INTERVENTION Obtain pre-HBO capillary blood glucose (ensure 1 physician order is in chart). A. Notify HBO physician and await physician orders. 2 If result is 70 mg/dl or below: B. If the result meets the hospital definition of a critical result, follow hospital policy. A. Give  patient an 8 ounce Glucerna Shake, an 8 ounce Ensure, or 8 ounces of a Glucerna/Ensure equivalent dietary supplement*. B. Wait 30 minutes. If result is 71 mg/dl to 130 mg/dl: C. Retest patients capillary blood glucose (CBG). D. If result greater than or equal to 110 mg/dl, proceed with HBO. If result less than 110 mg/dl, notify HBO physician and consider holding HBO. If result is 131 mg/dl to 249 mg/dl: A. Proceed with HBO. A. Notify HBO physician and await physician orders. B. It is recommended to hold HBO and do If result is 250 mg/dl or greater: blood/urine ketone testing. C. If the result meets the hospital definition of a critical result, follow hospital policy. POST-HBO GLYCEMIA INTERVENTIONS ACTION INTERVENTION Obtain post HBO capillary blood glucose (ensure 1 physician order is in chart). A. Notify HBO physician and await physician orders. 2 If result is 70 mg/dl or below: B. If the result meets the hospital definition of a critical result, follow hospital policy. A. Give patient an 8 ounce Glucerna Shake, an 8 ounce Ensure, or 8 ounces of a Glucerna/Ensure equivalent dietary supplement*. B. Wait 15 minutes for symptoms of If result is 71 mg/dl to 100 mg/dl: hypoglycemia (i.e. nervousness, anxiety, sweating, chills, clamminess, irritability, confusion, tachycardia or dizziness). C. If patient asymptomatic, discharge patient. If patient symptomatic, repeat capillary blood glucose (CBG) and notify HBO physician. If result is 101 mg/dl to 249 mg/dl: A. Discharge patient. A. Notify HBO physician and await physician orders. B. It is recommended to do blood/urine ketone If result is 250 mg/dl or greater: testing. C. If the result meets the hospital definition of a critical result, follow hospital policy. *Juice or candies are NOT equivalent products. If patient refuses the Glucerna or Ensure, please consult the hospital dietitian for an appropriate  substitute. Electronic Signature(s) Signed: 12/22/2019 4:23:17 PM By: Kela Millin Signed: 12/25/2019  5:22:40 PM By: Linton Ham MD Entered By: Kela Millin on 12/22/2019 14:38:19 -------------------------------------------------------------------------------- Problem List Details Patient Name: Date of Service: [name] [name], [name] [name] 12/22/2019 1:15 PM Medical Record Number: [id number] Patient Account Number: [id number] Date of Birth/Sex: Treating RN: [birth year] (58 y.o. 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 12/22/2019 1:15 PM Medical Record Number: [id number] Patient Account Number: [id number] Date of Birth/Sex: Treating RN: 13-Aug-1960 (58 y.o. Male) Kela Millin Primary Care Provider: [name] Other Clinician: Referring Provider: Treating Provider/Extender: [name] Weeks in Treatment: 0 Subjective Chief Complaint Information obtained from Patient 12/22/2019; patient is here for consideration of hyperbaric oxygen therapy for radiation induced injury to his oral cavity History of Present Illness (HPI) [place]; 10/24/2019 HPI Description: 10/24/2019 patient actually presents today for  initial evaluation here in our clinic concerning issues that he has been having with a significant issue concerning his left tonsil region. 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 He underwent treatment April 2020 through June 2020 which was definitive chemoradiation with weekly cisplatin. On September 2020 the end of treatment PET scan showed decrease in FDG avidity in the left tonsil but some residual soft tissue fullness but resolution of the left elbow in disease the patient does have a history of paroxysmal atrial fibrillation, peripheral vascular disease, and tobacco and ethanol abuse. He has seen ENT at [facility] and subsequently has undergone biopsy which most recently was on 10/02/2019 which showed paucicellular necrotic debris with abundant bacterial organisms. This was negative for fungal organisms. It appears therefore that the majority of the sloughing off and white debris noted in the oral cavity is necrotic debris/tissue that is sloughing off. There is no signs of active thrush and again this was on October 02, 2019 but the last biopsy was done. The patient did have a CT angiogram previous on 12/23/2018. 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 The study was somewhat limited in its full scope unfortunately. 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 There was occlusion of the proximal left internal iliac artery with a probable aneurysm in the left internal iliac artery measuring 1.9 cm. He had an abdominal aortic aneurysm measuring 3 cm. Nonetheless this is somewhat suggestive of some peripheral vascular issues and in general I am  more concerned about vascular issues with regard to the heart from the standpoint of hyperbaric oxygen therapy. The patient also with a history of atrial fibrillation tells me he has seen a cardiologist though not sure who it is that he saw. He was somewhat of a poor historian in this regard unfortunately. The majority  of the information that I obtained was from records that we had obtained from various sources. He unfortunately is not able to fully open his mouth and has significant discomfort he is also lost weight due to not being able to eat. He is still apparently smoking. The patient also has hypertension. According to record review it does appear that there is no residual malignancy noted according to the note from the [facility] dated 10/18/2019. 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 He subsequently was therefore referred to me for consideration of hyperbaric oxygen therapy. He will also continue with ENT surveillance as well as shoulder and gentle exercises to improve range of motion according to the note. ADMISSION 12/22/2019 This is a 59 year old man who is here for consideration of hyperbaric oxygen. He was seen on 10/24/2019 in our sister clinic in [place] also for consideration of hyperbaric oxygen although the timing of his daily dives could not be negotiated that time so he is here for reconsideration. He has a history of a squamous cell carcinoma of the lingual tonsil treated with radiation in 2020. He received 70 GY in 35 fractions from 11/08/2018 until 12/28/2018. He did fairly well until the beginning of this year when he developed trismus and severe pain in the left lower jaw. He had a CT scan that suggested more fullness question  recurrent malignancy versus radial fibrosis. He was followed closely by ENT at [facility] and had biopsies in February of this year and then again in March none of which showed no recurrent cancer only tissue necrosis. A CT scan in March showed no substantive changes radiation therapy or residual tumor. 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 The patient currently can only open his mouth about a centimeter. He has constant pain in the left part of his jaw probably secondary to nerve damage. He is able to only chew soft food. He has lost weight of 50 to 60 pounds. 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 Infectious disease thinks that this is probably noninfectious. He also has paroxysmal A. fib. The patient was seen in consultation by his primary doctor after he was in [place]. He was not felt to have any serious cardiopulmonary issue that needed further cardiac evaluation prior to initiating HBO. He had a chest x-ray on 4/21 that was normal, EKG showed atrial fibrillation heart rate 109, septal MI and left anterior fascicular block. He does not describe chest pain or exertional shortness of breath Patient History Unable to Obtain Patient History due to Altered Mental Status. Information obtained from Patient. Allergies No Known Drug Allergies Family History Cancer - Maternal Grandparents, No family history of Diabetes, Heart Disease, Hereditary Spherocytosis, Hypertension, Kidney Disease, Lung Disease, Seizures, Stroke, Thyroid Problems, Tuberculosis. Social History Former smoker, Marital Status - Divorced, Alcohol Use - Rarely, Drug Use - No History, Caffeine Use - Rarely. Medical History Eyes Denies history of  Cataracts, Glaucoma, Optic Neuritis Ear/Nose/Mouth/Throat Denies history of Chronic sinus problems/congestion, Middle ear problems Hematologic/Lymphatic Denies history of Anemia, Hemophilia, Human Immunodeficiency Virus,  Lymphedema, Sickle Cell Disease Respiratory Denies history of Aspiration, Asthma, Chronic Obstructive Pulmonary Disease (COPD), Pneumothorax, Sleep Apnea, Tuberculosis Cardiovascular Patient has history of Hypertension Denies history of Angina, Arrhythmia, Congestive Heart Failure, Coronary Artery Disease, Deep Vein Thrombosis, Hypotension, Myocardial Infarction, Peripheral Arterial Disease, Peripheral Venous Disease, Phlebitis, Vasculitis Gastrointestinal Denies history of Cirrhosis , Colitis, Crohnoos, Hepatitis A, Hepatitis B, Hepatitis C Endocrine Denies history of Type I Diabetes, Type II Diabetes Genitourinary Denies history of End Stage Renal Disease Immunological Denies history of Lupus Erythematosus, Raynaudoos, Scleroderma Integumentary (Skin) Denies history of History of Burn Musculoskeletal Denies history of Gout, Rheumatoid Arthritis, Osteoarthritis, Osteomyelitis Neurologic Denies history of Dementia, Neuropathy, Quadriplegia, Paraplegia, Seizure Disorder Oncologic Patient has history of Received Radiation - 2020 Denies history of Received Chemotherapy Psychiatric Denies history of Anorexia/bulimia, Confinement Anxiety Review of Systems (ROS) Constitutional Symptoms (General Health) Denies complaints or symptoms of Fatigue, Fever, Chills, Marked Weight Change. Eyes Complains or has symptoms of Glasses / Contacts. Denies complaints or symptoms of Dry Eyes, Vision Changes. Ear/Nose/Mouth/Throat Denies complaints or symptoms of Chronic sinus problems or rhinitis. Respiratory Denies complaints or symptoms of Chronic or frequent coughs, Shortness of Breath. Cardiovascular Denies complaints or symptoms of Chest pain. Gastrointestinal Denies  complaints or symptoms of Frequent diarrhea, Nausea, Vomiting. Endocrine Denies complaints or symptoms of Heat/cold intolerance. Genitourinary Denies complaints or symptoms of Frequent urination. Integumentary (Skin) Denies complaints or symptoms of Wounds. Musculoskeletal Denies complaints or symptoms of Muscle Pain, Muscle Weakness. Neurologic Denies complaints or symptoms of Numbness/parasthesias. Psychiatric Denies complaints or symptoms of Claustrophobia, Suicidal. Objective Constitutional Sitting or standing Blood Pressure is within target range for patient.. Pulse regular and within target range for patient.Marland Kitchen Respirations regular, non-labored and within target range.. Temperature is normal and within the target range for the patient.Marland Kitchen Appears in no distress. Vitals Time Taken: 1:38 PM, Height: 75 in, Source: Stated, Weight: 190 lbs, Source: Stated, BMI: 23.7, Temperature: 97.6 F, Pulse: 106 bpm, Respiratory Rate: 18 breaths/min, Blood Pressure: 103/72 mmHg. Ears, Nose, Mouth, and Throat His tongue is coated I really cannot see it to the back of his oropharynx to get a look at the tonsillar fossa on the left. Respiratory work of breathing is normal. Bilateral breath sounds are clear and equal in all lobes with no wheezes, rales or rhonchi.. Cardiovascular Heart rhythm and rate regular, without murmur or gallop.Marland Kitchen Lymphatic None palpable in the cervical cervical chain.Marland Kitchen Psychiatric appears at normal baseline. Assessment Active Problems ICD-10 Other specified disorders of the skin and subcutaneous tissue related to radiation Inflammatory conditions of jaws Malignant neoplasm of tonsil, unspecified Plan Follow-up Appointments: Other: - Will call you to let you know when to start HBO, will run through insurance for approval. Hyperbaric Oxygen Therapy: Evaluate for HBO Therapy Indication: - Soft Tissue Necrosis If appropriate for treatment, begin HBOT per protocol: 2.5 ATA  for 90 Minutes with 2 Five (5) Minute Air Breaks T Number of Treatments: - 40 otal One treatments per day (delivered Monday through Friday unless otherwise specified in Special Instructions below): Follow Hyperbaric Oxygen Glycemia Protocol Afrin (Oxymetazoline HCL) 0.05% nasal spray - 1 spray in both nostrils daily as needed prior to HBO treatment for difficulty clearing ears 1. 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 2. I have reviewed the last note from hematology oncology [place] who states that multiple conversations with the ENT at [facility] have been had and the consensus is that the abnormal area in the oropharynx on CT is most  likely radionecrosis as evidenced by necrotic tissue on multiple biopsies in the absence of any residual malignancy identified. 3. I have initiated orders for hyperbaric oxygen 2.5 atm for 40 treatments with 5-minute air breaks x2. Goal of therapy here will be reduction in patient's pain. Any improvement of his ability to open his mouth will be a secondary goal 4. He has had multiple courses of antifungals. Reviewing infectious disease last note suggested that they did not feel that this was actually an infectious issue. His tongue is thickly coated which may be oral hygiene issues. 5. I agree with his primary physician he has atrial fibrillation and perhaps coronary artery disease although there is no evidence that this is currently active. I do not think any more detailed cardiac evaluations are indicated. I spent 45 minutes in review of this patient's past medical record, face-to-face evaluation and creation of this record Electronic Signature(s) Signed: 12/25/2019 7:48:40 AM By: Linton Ham MD Entered By: Linton Ham on 12/25/2019 07:48:40 -------------------------------------------------------------------------------- HxROS Details Patient Name: Date of Service: [name] Coe. 12/22/2019 1:15 PM Medical Record Number:  [id number] Patient Account Number: [id number] Date of Birth/Sex: Treating RN: [birth year] (58 y.o. Male) [name] Primary Care Provider: [name] Other Clinician: Referring Provider: Treating Provider/Extender: Birdena Crandall, RYLEE Weeks in Treatment: 0 Unable to Obtain Patient History due to Altered Mental Status Information Obtained From Patient Constitutional Symptoms (General Health) Complaints and Symptoms: Negative for: Fatigue; Fever; Chills; Marked Weight Change Eyes Complaints and Symptoms: Positive for: Glasses / Contacts Negative for: Dry Eyes; Vision Changes Medical History: Negative for: Cataracts; Glaucoma; Optic Neuritis Ear/Nose/Mouth/Throat Complaints and Symptoms: Negative for: Chronic sinus problems or rhinitis Medical History: Negative for: Chronic sinus problems/congestion; Middle ear problems Respiratory Complaints and Symptoms: Negative for: Chronic or frequent coughs; Shortness of Breath Medical History: Negative for: Aspiration; Asthma; Chronic Obstructive Pulmonary Disease (COPD); Pneumothorax; Sleep Apnea; Tuberculosis Cardiovascular Complaints and Symptoms: Negative for: Chest pain Medical History: Positive for: Hypertension Negative for: Angina; Arrhythmia; Congestive Heart Failure; Coronary Artery Disease; Deep Vein Thrombosis; Hypotension; Myocardial Infarction; Peripheral Arterial Disease; Peripheral Venous Disease; Phlebitis; Vasculitis Gastrointestinal Complaints and Symptoms: Negative for: Frequent diarrhea; Nausea; Vomiting Medical History: Negative for: Cirrhosis ; Colitis; Crohns; Hepatitis A; Hepatitis B; Hepatitis C Endocrine Complaints and Symptoms: Negative for: Heat/cold intolerance Medical History: Negative for: Type I Diabetes; Type II Diabetes Genitourinary Complaints and Symptoms: Negative for: Frequent urination Medical History: Negative for: End Stage Renal Disease Integumentary  (Skin) Complaints and Symptoms: Negative for: Wounds Medical History: Negative for: History of Burn Musculoskeletal Complaints and Symptoms: Negative for: Muscle Pain; Muscle Weakness Medical History: Negative for: Gout; Rheumatoid Arthritis; Osteoarthritis; Osteomyelitis Neurologic Complaints and Symptoms: Negative for: Numbness/parasthesias Medical History: Negative for: Dementia; Neuropathy; Quadriplegia; Paraplegia; Seizure Disorder Psychiatric Complaints and Symptoms: Negative for: Claustrophobia; Suicidal Medical History: Negative for: Anorexia/bulimia; Confinement Anxiety Hematologic/Lymphatic Medical History: Negative for: Anemia; Hemophilia; Human Immunodeficiency Virus; Lymphedema; Sickle Cell Disease Immunological Medical History: Negative for: Lupus Erythematosus; Raynauds; Scleroderma Oncologic Medical History: Positive for: Received Radiation - 2020 Negative for: Received Chemotherapy Immunizations Pneumococcal Vaccine: Received Pneumococcal Vaccination: No Implantable Devices None Family and Social History Cancer: Yes - Maternal Grandparents; Diabetes: No; Heart Disease: No; Hereditary Spherocytosis: No; Hypertension: No; Kidney Disease: No; Lung Disease: No; Seizures: No; Stroke: No; Thyroid Problems: No; Tuberculosis: No; Former smoker; Marital Status - Divorced; Alcohol Use: Rarely; Drug Use: No History; Caffeine Use: Rarely; Financial Concerns: No; Food, Clothing or Shelter Needs: No; Support System Lacking: No; Transportation  Concerns: No Electronic Signature(s) Signed: 12/25/2019 5:22:40 PM By: Linton Ham MD Signed: 12/26/2019 4:34:33 PM By: [name] RN Entered By: [name] on 12/22/2019 13:47:36 -------------------------------------------------------------------------------- SuperBill Details Patient Name: Date of Service: [name] Coe 12/22/2019 Medical Record Number: [id number] Patient Account Number: [id number] Date of  Birth/Sex: Treating RN: 12-29-1960 (58 y.o. 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

## 2019-12-26 NOTE — Progress Notes (Signed)
[name] [name] ([id number]) Visit Report for 12/22/2019 Allergy List Details Patient Name: Date of Service: [name] [name], [name] [name] 12/22/2019 1:15 PM Medical Record Number: [id number] Patient Account Number: [id number] Date of Birth/Sex: Treating RN: 1961-06-09 (59 y.o. Male) [name] Primary Care Genola Yuille: [name] Other Clinician: Referring Bunnie Lederman: Treating Alika Saladin/Extender: Birdena Crandall, RYLEE Weeks in Treatment: 0 Allergies Active Allergies No Known Drug Allergies Allergy Notes Electronic Signature(s) Signed: 12/26/2019 4:34:33 PM By: [name] RN Entered By: [name] on 12/22/2019 13:43:17 -------------------------------------------------------------------------------- [place] Information Details Patient Name: Date of Service: [name] [name] 12/22/2019 1:15 PM Medical Record Number: [id number] Patient Account Number: [id number] Date of Birth/Sex: Treating RN: 1961/05/02 (58 y.o. Male) Kela Millin Primary Care Derreck Wiltsey: [name] Other Clinician: Referring Mylani Gentry: Treating Ilean Spradlin/Extender: [name] Weeks in Treatment: 0 Visit Information Patient Arrived: Ambulatory Arrival Time: 13:38 Accompanied By: self Transfer Assistance: None Patient Identification Verified: Yes Secondary Verification Process Completed: Yes Patient Requires Transmission-Based Precautions: No Patient Has Alerts: No Electronic Signature(s) Signed: 12/26/2019 4:34:33 PM By: [name] RN Entered By: [name] on 12/22/2019 13:42:56 -------------------------------------------------------------------------------- Clinic Level of Care Assessment Details Patient Name: Date of Service: [name] [name] 12/22/2019 1:15 PM Medical Record Number: [id number] Patient Account Number: [id number] Date of Birth/Sex: Treating RN: 02-May-1961 (58 y.o. Male) Kela Millin Primary Care Aurie Harroun: [name] Other  Clinician: Referring Michaelene Dutan: Treating Areyanna Figeroa/Extender: Birdena Crandall, RYLEE Weeks in Treatment: 0 Clinic Level of Care Assessment Items TOOL 2 Quantity Score X- 1 0 Use when only an EandM is performed on the INITIAL visit ASSESSMENTS - Nursing Assessment / Reassessment X- 1 20 General Physical Exam (combine w/ comprehensive assessment (listed just below) when performed on new pt. evals) X- 1 25 Comprehensive Assessment (HX, ROS, Risk Assessments, Wounds Hx, etc.) ASSESSMENTS - Wound and Skin A ssessment / Reassessment []  - 0 Simple Wound Assessment / Reassessment - one wound []  - 0 Complex Wound Assessment / Reassessment - multiple wounds []  - 0 Dermatologic / Skin Assessment (not related to wound area) ASSESSMENTS - Ostomy and/or Continence Assessment and Care []  - 0 Incontinence Assessment and Management []  - 0 Ostomy Care Assessment and Management (repouching, etc.) PROCESS - Coordination of Care X - Simple Patient / Family Education for ongoing care 1 15 []  - 0 Complex (extensive) Patient / Family Education for ongoing care X- 1 10 Staff obtains Programmer, systems, Records, T Results / Process Orders est []  - 0 Staff telephones HHA, Nursing Homes / Clarify orders / etc []  - 0 Routine Transfer to another Facility (non-emergent condition) []  - 0 Routine Hospital Admission (non-emergent condition) X- 1 15 New Admissions / Biomedical engineer / Ordering NPWT Apligraf, etc. , []  - 0 Emergency Hospital Admission (emergent condition) X- 1 10 Simple Discharge Coordination []  - 0 Complex (extensive) Discharge Coordination PROCESS - Special Needs []  - 0 Pediatric / Minor Patient Management []  - 0 Isolation Patient Management []  - 0 Hearing / Language / Visual special needs []  - 0 Assessment of Community assistance (transportation, D/C planning, etc.) []  - 0 Additional assistance / Altered mentation []  - 0 Support Surface(s) Assessment (bed, cushion,  seat, etc.) INTERVENTIONS - Wound Cleansing / Measurement []  - 0 Wound Imaging (photographs - any number of wounds) []  - 0 Wound Tracing (instead of photographs) []  - 0 Simple Wound Measurement - one wound []  - 0 Complex Wound Measurement - multiple wounds []  - 0 Simple Wound  Cleansing - one wound []  - 0 Complex Wound Cleansing - multiple wounds INTERVENTIONS - Wound Dressings []  - 0 Small Wound Dressing one or multiple wounds []  - 0 Medium Wound Dressing one or multiple wounds []  - 0 Large Wound Dressing one or multiple wounds []  - 0 Application of Medications - injection INTERVENTIONS - Miscellaneous []  - 0 External ear exam []  - 0 Specimen Collection (cultures, biopsies, blood, body fluids, etc.) []  - 0 Specimen(s) / Culture(s) sent or taken to Lab for analysis []  - 0 Patient Transfer (multiple staff / Harrel Lemon Lift / Similar devices) []  - 0 Simple Staple / Suture removal (25 or less) []  - 0 Complex Staple / Suture removal (26 or more) []  - 0 Hypo / Hyperglycemic Management (close monitor of Blood Glucose) []  - 0 Ankle / Brachial Index (ABI) - do not check if billed separately Has the patient been seen at the hospital within the last three years: Yes Total Score: 95 Level Of Care: New/Established - Level 3 Electronic Signature(s) Signed: 12/22/2019 4:23:17 PM By: Kela Millin Entered By: Kela Millin on 12/22/2019 14:17:16 -------------------------------------------------------------------------------- Multi-Disciplinary Care Plan Details Patient Name: Date of Service: [name] [name]. 12/22/2019 1:15 PM Medical Record Number: [id number] Patient Account Number: [id number] Date of Birth/Sex: Treating RN: April 05, 1961 (59 y.o. Male) Kela Millin Primary Care Sae Handrich: [name] Other Clinician: Referring Salome Cozby: Treating Peggye Poon/Extender: Birdena Crandall, RYLEE Weeks in Treatment: 0 Active Inactive HBO Nursing  Diagnoses: Potential for barotraumas to ears, sinuses, teeth, and lungs or cerebral gas embolism related to changes in atmospheric pressure inside hyperbaric oxygen chamber Potential for oxygen toxicity seizures related to delivery of 100% oxygen at an increased atmospheric pressure Potential for pulmonary oxygen toxicity related to delivery of 100% oxygen at an increased atmospheric pressure Goals: Patient will tolerate the hyperbaric oxygen therapy treatment Date Initiated: 12/22/2019 Target Resolution Date: 02/16/2020 Goal Status: Active Interventions: Assess and provide for patients comfort related to the hyperbaric environment and equalization of middle ear Assess for signs and symptoms related to adverse events, including but not limited to confinement anxiety, pneumothorax, oxygen toxicity and baurotrauma Assess patient for any history of confinement anxiety Notes: Electronic Signature(s) Signed: 12/22/2019 4:23:17 PM By: Kela Millin Entered By: Kela Millin on 12/22/2019 14:15:55 -------------------------------------------------------------------------------- Pain Assessment Details Patient Name: Date of Service: JOHSUA, SHEVLIN 12/22/2019 1:15 PM Medical Record Number: [id number] Patient Account Number: [id number] Date of Birth/Sex: Treating RN: Aug 08, 1960 (58 y.o. Male) [name] Primary Care Alhaji Mcneal: [name] Other Clinician: Referring Cayde Held: Treating Neftaly Swiss/Extender: Birdena Crandall, RYLEE Weeks in Treatment: 0 Active Problems Location of Pain Severity and Description of Pain Patient Has Paino No Site Locations Pain Management and Medication Current Pain Management: Electronic Signature(s) Signed: 12/26/2019 4:34:33 PM By: [name] RN Entered By: [name] on 12/22/2019 13:51:24 -------------------------------------------------------------------------------- Patient/Caregiver Education Details Patient Name: Date of  Service: [name] [name] 6/4/2021andnbsp1:15 PM Medical Record Number: [id number] Patient Account Number: [id number] Date of Birth/Gender: Treating RN: 22-May-1961 (58 y.o. Male) Kela Millin Primary Care Physician: [name] Other Clinician: Referring Physician: Treating Physician/Extender: [name] Weeks in Treatment: 0 Education Assessment Education Provided To: Patient Education Topics Provided Hyperbaric Oxygenation: Handouts: Hyperbaric Oxygen Methods: Explain/Verbal Responses: State content correctly Electronic Signature(s) Signed: 12/22/2019 4:23:17 PM By: Kela Millin Entered By: Kela Millin on 12/22/2019 14:16:08 -------------------------------------------------------------------------------- Vitals Details Patient Name: Date of Service: [name] [name]. 12/22/2019 1:15 PM Medical Record Number:  [id number] Patient Account Number: [id number] Date of Birth/Sex: Treating RN: 02-Apr-1961 (58 y.o. Male) Kela Millin Primary Care Takina Busser: [name] Other Clinician: Referring Vaunda Gutterman: Treating Maclaine Ahola/Extender: Birdena Crandall, RYLEE Weeks in Treatment: 0 Vital Signs Time Taken: 13:38 Temperature (F): 97.6 Height (in): 75 Pulse (bpm): 106 Source: Stated Respiratory Rate (breaths/min): 18 Weight (lbs): 190 Blood Pressure (mmHg): 103/72 Source: Stated Reference Range: 80 - 120 mg / dl Body Mass Index (BMI): 23.7 Electronic Signature(s) Signed: 12/26/2019 4:34:33 PM By: [name] RN Entered By: [name] on 12/22/2019 13:43:01

## 2019-12-26 NOTE — Progress Notes (Signed)
[name] [name], [name] [name] ([id number]) Visit Report for 12/22/2019 Abuse/Suicide Risk Screen Details Patient Name: Date of Service: [name] [name], [name] [name] 12/22/2019 1:15 PM Medical Record Number: [id number] Patient Account Number: [id number] Date of Birth/Sex: Treating RN: 06-24-1961 (59 y.o. Male) [name] Primary Care Donyea Beverlin: [name] Other Clinician: Referring Eldred Sooy: Treating Finnigan Warriner/Extender: Birdena Crandall, RYLEE Weeks in Treatment: 0 Abuse/Suicide Risk Screen Items Answer ABUSE RISK SCREEN: Has anyone close to you tried to hurt or harm you recentlyo No Do you feel uncomfortable with anyone in your familyo No Has anyone forced you do things that you didnt want to doo No Electronic Signature(s) Signed: 12/26/2019 4:34:33 PM By: [name] RN Entered By: [name] on 12/22/2019 13:48:04 -------------------------------------------------------------------------------- Activities of Daily Living Details Patient Name: Date of Service: [name] [name], [name] [name] 12/22/2019 1:15 PM Medical Record Number: [id number] Patient Account Number: [id number] Date of Birth/Sex: Treating RN: 25-Nov-1960 (59 y.o. Male) [name] Primary Care Salmaan Patchin: [name] Other Clinician: Referring Zinedine Ellner: Treating Divine Imber/Extender: Birdena Crandall, RYLEE Weeks in Treatment: 0 Activities of Daily Living Items Answer Activities of Daily Living (Please select one for each item) Drive Automobile Completely Able T Medications ake Completely Able Use T elephone Completely Able Care for Appearance Completely Able Use T oilet Completely Able Bath / Shower Completely Able Dress Self Completely Able Feed Self Completely Able Walk Completely Able Get In / Out Bed Completely Able Housework Completely Able Prepare Meals Completely [place] for Self Completely Able Electronic Signature(s) Signed: 12/26/2019 4:34:33 PM By: [name] RN Entered  By: [name] on 12/22/2019 13:48:30 -------------------------------------------------------------------------------- Education Screening Details Patient Name: Date of Service: [name] [name], [name] [name] 12/22/2019 1:15 PM Medical Record Number: [id number] Patient Account Number: [id number] Date of Birth/Sex: Treating RN: [birth year] (58 y.o. Male) [name] Primary Care Hetty Linhart: [name] Other Clinician: Referring Jesenya Bowditch: Treating Makar Slatter/Extender: [name] Weeks in Treatment: 0 Primary Learner Assessed: Patient Learning Preferences/Education Level/Primary Language Learning Preference: Explanation Highest Education Level: College or Above Preferred Language: English Cognitive Barrier Language Barrier: No Translator Needed: No Memory Deficit: No Emotional Barrier: No Cultural/Religious Beliefs Affecting Medical Care: No Physical Barrier Impaired Vision: Yes Glasses Impaired Hearing: No Decreased Hand dexterity: No Knowledge/Comprehension Knowledge Level: Medium Comprehension Level: High Ability to understand written instructions: High Ability to understand verbal instructions: High Motivation Anxiety Level: Anxious Cooperation: Cooperative Education Importance: Acknowledges Need Interest in Health Problems: Asks Questions Perception: Coherent Willingness to Engage in Self-Management High Activities: Readiness to Engage in Self-Management High Activities: Electronic Signature(s) Signed: 12/26/2019 4:34:33 PM By: [name] RN Entered By: [name] on 12/22/2019 13:49:00 -------------------------------------------------------------------------------- Fall Risk Assessment Details Patient Name: Date of Service: [name] Coe. 12/22/2019 1:15 PM Medical Record Number: [id number] Patient Account Number: [id number] Date of Birth/Sex: Treating RN: May 30, 1961 (58 y.o. Male) [name] Primary Care Tequia Wolman: [name] Other  Clinician: Referring Lourdez Mcgahan: Treating Cruzito Standre/Extender: Birdena Crandall, RYLEE Weeks in Treatment: 0 Fall Risk Assessment Items Have you had 2 or more falls in the last 12 monthso 0 No Have you had any fall that resulted in injury in the last 12 monthso 0 No FALLS RISK SCREEN History of falling - immediate or within 3 months 0 No Secondary diagnosis (Do you have 2 or more medical diagnoseso) 0 No Ambulatory aid None/bed rest/wheelchair/nurse 0 No Crutches/cane/walker 0 No Furniture 0 No Intravenous therapy Access/Saline/Heparin Lock 0 No Gait/Transferring Normal/ bed  rest/ wheelchair 0 No Weak (short steps with or without shuffle, stooped but able to lift head while walking, may seek 0 No support from furniture) Impaired (short steps with shuffle, may have difficulty arising from chair, head down, impaired 0 No balance) Mental Status Oriented to own ability 0 No Electronic Signature(s) Signed: 12/26/2019 4:34:33 PM By: [name] RN Entered By: [name] on 12/22/2019 13:49:09 -------------------------------------------------------------------------------- Nutrition Risk Screening Details Patient Name: Date of Service: [name] [name], [name] [name] 12/22/2019 1:15 PM Medical Record Number: [id number] Patient Account Number: [id number] Date of Birth/Sex: Treating RN: 12-26-1960 (59 y.o. Male) [name] Primary Care Divina Neale: [name] Other Clinician: Referring Miamor Ayler: Treating Kaushik Maul/Extender: Birdena Crandall, RYLEE Weeks in Treatment: 0 Height (in): 75 Weight (lbs): 190 Body Mass Index (BMI): 23.7 Nutrition Risk Screening Items Score Screening NUTRITION RISK SCREEN: I have an illness or condition that made me change the kind and/or amount of food I eat 2 Yes I eat fewer than two meals per day 0 No I eat few fruits and vegetables, or milk products 2 Yes I have three or more drinks of beer, liquor or wine almost every day 0 No I have  tooth or mouth problems that make it hard for me to eat 0 No I don't always have enough money to buy the food I need 0 No I eat alone most of the time 0 No I take three or more different prescribed or over-the-counter drugs a day 1 Yes Without wanting to, I have lost or gained 10 pounds in the last six months 2 Yes I am not always physically able to shop, cook and/or feed myself 0 No Nutrition Protocols Good Risk Protocol Moderate Risk Protocol High Risk Proctocol 0 Provide education on nutrition Risk Level: High Risk Score: 7 Electronic Signature(s) Signed: 12/26/2019 4:34:33 PM By: [name] RN Entered By: [name] on 12/22/2019 13:50:19

## 2020-01-01 ENCOUNTER — Other Ambulatory Visit: Payer: Self-pay

## 2020-01-01 ENCOUNTER — Ambulatory Visit: Payer: Medicaid Other | Attending: Radiation Oncology

## 2020-01-01 ENCOUNTER — Telehealth: Payer: Self-pay | Admitting: Adult Health Nurse Practitioner

## 2020-01-01 ENCOUNTER — Other Ambulatory Visit: Payer: Self-pay | Admitting: Hematology

## 2020-01-01 DIAGNOSIS — M6249 Contracture of muscle, multiple sites: Secondary | ICD-10-CM

## 2020-01-01 DIAGNOSIS — R293 Abnormal posture: Secondary | ICD-10-CM | POA: Diagnosis not present

## 2020-01-01 DIAGNOSIS — M542 Cervicalgia: Secondary | ICD-10-CM | POA: Diagnosis not present

## 2020-01-01 DIAGNOSIS — L599 Disorder of the skin and subcutaneous tissue related to radiation, unspecified: Secondary | ICD-10-CM | POA: Diagnosis not present

## 2020-01-01 NOTE — Telephone Encounter (Signed)
Returned [name] [name]'s message about needing refills for oxycodone and gabapentin.  Checked [facility] website and oxy last prescribed by this provider on 10/17/19.  Sent in escripts for refills on gabapentin 100 Q8 hrs PRN for pain and oxycodone 5 mg Q8 hrs PRN for pain to [facility] in [place].  Offered to set up appointment.  Wanted to wait and will call when needed.  Also needed refill on flexeril.  Will reach out to Dr. [name] office with this request. Tzirel Leonor K. Olena Heckle NP

## 2020-01-01 NOTE — Therapy (Addendum)
[place], [place], [address] Phone: [phone]   Fax:  [phone]  Physical Therapy Treatment  Patient Details  Name: [name] [name] MRN: [id number] Date of Birth: 04/14/1961 Referring Provider (PT): Reita May Date: 01/01/2020   PT End of Session - 01/01/20 1509    Visit Number 23   visit 11 in 2021   Number of Visits 30    Date for PT Re-Evaluation 01/23/20    Authorization Type pending medicaid approval- 3 visits per 30 day auth; 09/28/19-10/25/19 for 8 visits; new renewal period: 8 visits from 11/28/19 to 01/22/20    Authorization - Visit Number 1    Authorization - Number of Visits 8    PT Start Time 6962    PT Stop Time 1506    PT Time Calculation (min) 59 min    Activity Tolerance Patient tolerated treatment well    Behavior During Therapy [employer] for tasks assessed/performed           Past Medical History:  Diagnosis Date  . 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 Benign essential HTN 01/04/2012  . Cancer ([place])    tonsilar  . Chronic anticoagulation 01/02/2013  . Dermatofibroma of forearm 01/02/2013   Left side  . Hyperlipidemia, mixed 01/04/2012  . 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 Primary hypercoagulable state ([place]) 10/01/2014  . Sinus bradycardia, chronic 01/04/2012  . Superficial thrombosis of lower extremity 05/02/2012    Past Surgical History:  Procedure Laterality Date  . DIRECT LARYNGOSCOPY Left 10/19/2018   Procedure: DIRECT LARYNGOSCOPY;  Surgeon: Leta Baptist, MD;  Location: [facility];  Service: ENT;  Laterality: Left;  . IR IMAGING GUIDED PORT INSERTION  11/04/2018  . IR REMOVAL TUN ACCESS W/ PORT W/O FL MOD SED  05/01/2019  . TONSILLECTOMY Left 10/19/2018   Procedure: BIOPSY OF LEFT  TONSIL;  Surgeon: Leta Baptist, MD;  Location: [facility];  Service: ENT;  Laterality: Left;    There were no vitals filed for this visit.   Subjective Assessment - 01/01/20 1407    Subjective I haven't been back since my eval bc I never got a call from you guys. I'm using my Flexitouch every day now. And I am finally going to start hyperbaric treatments tomorrow daily for the next 8 weeks.    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 Yes    Pain Score 3     Pain Location Jaw    Pain Orientation Left    Pain Descriptors / Indicators Sharp    Pain Type Chronic pain   post radiation   Pain Onset More than a month ago    Aggravating Factors  light touch    Pain Relieving Factors advil and massage when tolerable                             [facility] Adult PT Treatment/Exercise - 01/01/20 0001      Manual Therapy  Joint Mobilization --    Soft tissue mobilization With biotone to Lt>Rt cervical muscles and along jaw line where pt c/o most sensitivity and TTP; focused at [facility] origin and insertion and belly of levator scapulae muscles where most tightness and trigger points palpable    Myofascial Release Along Lt lateral neck from jaw to clavicle with pt positioned in Rt lateral rotation    Passive ROM In supine: Cervical ROM rotation, side bending and suboccipital release; then at end of session with gloves on and thumbs then on both sides of lower teeth for passive stretch to mouth opening 5 reps, 5 sec holds, pt with some increased opening after                       PT Long Term Goals - 11/23/19 1419      PT LONG TERM GOAL #1   Title Pt will be able to independently manage lymphedema through self MLD and  compression garments    Baseline Pt has been using compression garment and flexitouch at home, but still is has tightness, fullness and pain    Time 4    Period Weeks    Status On-going      PT LONG TERM GOAL #2   Title Pt will receive appropriate compression garments for long term management of lymphedema    Status Achieved      PT LONG TERM GOAL #3   Title Pt will reduce swelling at 8 cm superior to sternal notch by 2 cm to decrease risk of swelling.    Baseline 50.2 cm, 04/25/19- 50.4 but rest of neck demonstrates around 1.5 cm reduction, 06/01/19: 49.1; 42.5 cm - 07/03/19  40 on 09/26/2019    Status Achieved      PT LONG TERM GOAL #4   Title Pt to demonstrate 30 mm of jaw opening to allow pt to eat food.    Baseline 15 mm 10/23/2019    Time 8    Period Weeks    Status On-going      PT LONG TERM GOAL #5   Title Pt to demonstrate 8 cm of bilateral lateral jaw excursion to allow pt to return to PLOF    Baseline R 7 mm, L 31m at baseline    Time 8    Period Weeks    Status On-going      Additional Long Term Goals   Additional Long Term Goals Yes      PT LONG TERM GOAL #6   Title Pt will repot a 50% decrease in jaw pain to allow pt to return to prior level of function and be able to eat.    Baseline Pt states he still has significant pain especailly to touch    Time 4    Period Weeks    Status On-going      PT LONG TERM GOAL #7   Title Pt will increase cervical extension to 40 degrees to allow him to look up easier in functional tasks    Baseline 20 degrees on 11/23/2019    Time 8    Period Weeks    Status New                 Plan - 01/01/20 1812    Clinical Impression Statement Was able to resume treatment today as pt has been approved for 8 more visits until 01/22/20. Focused on manual therapy today working on improving his cervical ROM and decreasing  muscular tightness at his Lt>Rt side. Some softneing was noted by pt and therapist. Also pt was able to open his mouth  slightly more at end of session today after jaw stretching. Pt begins his hyperbaric chamber treatments tomorrow.    Personal Factors and Comorbidities Comorbidity 2;Comorbidity 3+    Comorbidities history of multiple DVT and PE, radiation fibrosis and radiation necrosis    Stability/Clinical Decision Making Evolving/Moderate complexity   pt will be having hyperbaric treatment   Rehab Potential Good    PT Frequency 1x / week    PT Duration 8 weeks    PT Treatment/Interventions ADLs/Self Care Home Management;Therapeutic exercise;Manual lymph drainage;Manual techniques;Compression bandaging;Taping;Therapeutic activities;Patient/family education;Passive range of motion;Joint Manipulations    PT Next Visit Plan Continue to  work on chin tuck and neck extension  and contract relax with masseter/medial pterygoid next session, continue with myofascial relase to head and neck,  Jaw massage and mobilization,  MLD to head and neck, try tongue to roof of mouth, jaw retraction/protraction and cervical retraction.    PT Home Exercise Plan wear head and neck garment, self MLD, neck and jaw ROM exercises trying TENS with this    Consulted and Agree with Plan of Care Patient           Patient will benefit from skilled therapeutic intervention in order to improve the following deficits and impairments:  Postural dysfunction, Decreased strength, Increased edema, Decreased knowledge of precautions, Pain, Increased fascial restricitons, Decreased range of motion, Increased muscle spasms  Visit Diagnosis: Disorder of the skin and subcutaneous tissue 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 Encounter for cardiac risk counseling 11/08/2019  . Hypokalemia 08/21/2019  . Warfarin anticoagulation 02/20/2019  . Hypercholesteremia 02/07/2019  . Recurrent deep vein thrombosis  (DVT) ([place]) 02/06/2019  . Thoracic ascending aortic aneurysm ([place]) 02/06/2019  . Port-A-Cath in place 01/18/2019  . Mucositis due to chemotherapy 12/07/2018  . Hypomagnesemia 12/07/2018  . Thrush 12/07/2018  . Malignant neoplasm of tonsillar fossa ([place]) 10/17/2018  . 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 DVT, lower extremity, proximal ([place]) 05/24/2013  . Chronic anticoagulation 01/02/2013  . Superficial thrombosis of lower extremity 05/02/2012  . Benign essential HTN 01/04/2012  . Hyperlipidemia, mixed 01/04/2012    Otelia Limes, PTA 01/01/2020, 6:17 PM  [place], [place], [address] Phone: [phone]   Fax:  [phone]  Name: [name] [name] MRN: [id number] Date of Birth: [birth year]  PHYSICAL THERAPY DISCHARGE SUMMARY  Visits from Start of Care: 23  Current functional level related to goals / functional outcomes: See above   Remaining deficits: See above   Education / Equipment: HEP  Plan: Patient agrees to discharge.  Patient goals were not met. Patient is being discharged due to not returning since the last visit.  ?????     [facility] [place], [place] 04/22/20 8:09 AM

## 2020-01-02 ENCOUNTER — Encounter (HOSPITAL_BASED_OUTPATIENT_CLINIC_OR_DEPARTMENT_OTHER): Payer: Medicaid Other | Admitting: Internal Medicine

## 2020-01-02 DIAGNOSIS — C099 Malignant neoplasm of tonsil, unspecified: Secondary | ICD-10-CM | POA: Diagnosis not present

## 2020-01-02 DIAGNOSIS — M272 Inflammatory conditions of jaws: Secondary | ICD-10-CM | POA: Diagnosis not present

## 2020-01-02 DIAGNOSIS — L598 Other specified disorders of the skin and subcutaneous tissue related to radiation: Secondary | ICD-10-CM | POA: Diagnosis not present

## 2020-01-02 NOTE — Progress Notes (Signed)
[name] [name], [name] [name] ([id number]) Visit Report for 01/02/2020 SuperBill Details Patient Name: Date of Service: [name] [name], [name] [name] 01/02/2020 Medical Record Number: [id number] Patient Account Number: [id number] Date of Birth/Sex: Treating RN: 05-Apr-1961 (58 y.o. [name]) [name] Primary Care Provider: [name] Other Clinician: [name] Referring Provider: Treating Provider/Extender: [name] Weeks in Treatment: 1 Diagnosis Coding ICD-10 Codes Code Description L59.8 Other specified disorders of the skin and subcutaneous tissue related to radiation M27.2 Inflammatory conditions of jaws C09.9 Malignant neoplasm of tonsil, unspecified Facility Procedures CPT4 Code Description Modifier Quantity [id number] 99183-Physician attendance and supervision of hyperbaric oxygen therapy, per session 1 ICD-10 Diagnosis Description L59.8 Other specified disorders of the skin and subcutaneous tissue related to radiation Physician Procedures Quantity CPT4 Code Description Modifier [id number] - WC PHYS HYPERBARIC OXYGEN THERAPY 1 ICD-10 Diagnosis Description L59.8 Other specified disorders of the skin and subcutaneous tissue related to radiation Electronic Signature(s) Signed: 01/02/2020 5:48:17 PM By: [name] EMT/HBOT/SD Signed: 01/02/2020 5:55:30 PM By: Linton Ham MD Entered By: [name] on 01/02/2020 17:47:35

## 2020-01-03 ENCOUNTER — Other Ambulatory Visit: Payer: Self-pay

## 2020-01-03 ENCOUNTER — Encounter (HOSPITAL_BASED_OUTPATIENT_CLINIC_OR_DEPARTMENT_OTHER): Payer: Medicaid Other | Admitting: Physician Assistant

## 2020-01-03 DIAGNOSIS — M272 Inflammatory conditions of jaws: Secondary | ICD-10-CM | POA: Diagnosis not present

## 2020-01-03 DIAGNOSIS — C099 Malignant neoplasm of tonsil, unspecified: Secondary | ICD-10-CM | POA: Diagnosis not present

## 2020-01-03 DIAGNOSIS — L598 Other specified disorders of the skin and subcutaneous tissue related to radiation: Secondary | ICD-10-CM | POA: Diagnosis not present

## 2020-01-04 ENCOUNTER — Encounter (HOSPITAL_BASED_OUTPATIENT_CLINIC_OR_DEPARTMENT_OTHER): Payer: Medicaid Other | Admitting: Internal Medicine

## 2020-01-04 ENCOUNTER — Encounter: Payer: Self-pay | Admitting: Physical Therapy

## 2020-01-04 ENCOUNTER — Encounter: Payer: Medicaid Other | Admitting: Physical Therapy

## 2020-01-04 ENCOUNTER — Other Ambulatory Visit: Payer: Self-pay | Admitting: Internal Medicine

## 2020-01-04 ENCOUNTER — Other Ambulatory Visit: Payer: Self-pay

## 2020-01-04 DIAGNOSIS — Z7189 Other specified counseling: Secondary | ICD-10-CM

## 2020-01-04 DIAGNOSIS — C099 Malignant neoplasm of tonsil, unspecified: Secondary | ICD-10-CM | POA: Diagnosis not present

## 2020-01-04 DIAGNOSIS — L598 Other specified disorders of the skin and subcutaneous tissue related to radiation: Secondary | ICD-10-CM | POA: Diagnosis not present

## 2020-01-04 DIAGNOSIS — M272 Inflammatory conditions of jaws: Secondary | ICD-10-CM | POA: Diagnosis not present

## 2020-01-04 NOTE — Progress Notes (Signed)
[name] [name], [name] [name] ([id number]) Visit Report for 01/03/2020 SuperBill Details Patient Name: Date of Service: [name] [name], [name] [name] 01/03/2020 Medical Record Number: [id number] Patient Account Number: [id number] Date of Birth/Sex: Treating RN: [birth year] (59 y.o. M) Primary Care Provider: [name] Other Clinician: [name] Referring Provider: Treating Provider/Extender: Eloy End, RYLEE Weeks in Treatment: 1 Diagnosis Coding ICD-10 Codes Code Description L59.8 Other specified disorders of the skin and subcutaneous tissue related to radiation M27.2 Inflammatory conditions of jaws C09.9 Malignant neoplasm of tonsil, unspecified Facility Procedures CPT4 Code Description Modifier Quantity [id number] 99183-Physician attendance and supervision of hyperbaric oxygen therapy, per session 1 ICD-10 Diagnosis Description L59.8 Other specified disorders of the skin and subcutaneous tissue related to radiation Physician Procedures Quantity CPT4 Code Description Modifier [id number] - WC PHYS HYPERBARIC OXYGEN THERAPY 1 ICD-10 Diagnosis Description L59.8 Other specified disorders of the skin and subcutaneous tissue related to radiation Electronic Signature(s) Signed: 01/03/2020 5:15:28 PM By: [name] EMT/HBOT/SD Signed: 01/04/2020 1:39:37 PM By: [name] PA-C Entered By: [name] on 01/03/2020 17:14:32

## 2020-01-05 ENCOUNTER — Encounter (HOSPITAL_BASED_OUTPATIENT_CLINIC_OR_DEPARTMENT_OTHER): Payer: Medicaid Other | Admitting: Internal Medicine

## 2020-01-05 NOTE — Progress Notes (Signed)
LOCKE, BARRELL ([id number]) Visit Report for 01/04/2020 Arrival Information Details Patient Name: Date of Service: [name] [name], [name] [name] 01/04/2020 3:00 PM Medical Record Number: [id number] Patient Account Number: [id number] Date of Birth/Sex: Treating RN: 05/28/1961 (59 y.o. [name] [name], [name] Primary Care Zayleigh Stroh: [name] Other Clinician: [name] Referring Alvan Culpepper: Treating Erdem Naas/Extender: [name] Weeks in Treatment: 1 Visit Information History Since Last Visit Added or deleted any medications: No Patient Arrived: Ambulatory Any new allergies or adverse reactions: No Arrival Time: 14:56 Had a fall or experienced change in No Accompanied By: self activities of daily living that may affect Transfer Assistance: None risk of falls: Patient Identification Verified: Yes Signs or symptoms of abuse/neglect since last visito No Secondary Verification Process Completed: Yes Hospitalized since last visit: No Patient Requires Transmission-Based Precautions: No Implantable device outside of the clinic excluding No Patient Has Alerts: No cellular tissue based products placed in the center since last visit: Pain Present Now: No Electronic Signature(s) Signed: 01/05/2020 8:50:29 AM By: [name] Entered By: [name] on 01/04/2020 15:30:46 -------------------------------------------------------------------------------- Encounter Discharge Information Details Patient Name: Date of Service: [name] Coe. 01/04/2020 3:00 PM Medical Record Number: [id number] Patient Account Number: [id number] Date of Birth/Sex: Treating RN: 02/09/1961 (59 y.o. [name] [name] Primary Care Ajene Carchi: [name] Other Clinician: [name] Referring Regina Coppolino: Treating Reeya Bound/Extender: [name] Weeks in Treatment: 1 Encounter Discharge Information Items Discharge Condition: Stable Ambulatory Status:  Ambulatory Discharge Destination: Home Transportation: Private Auto Accompanied By: self Schedule Follow-up Appointment: Yes Clinical Summary of Care: Patient Declined Electronic Signature(s) Signed: 01/04/2020 5:25:18 PM By: [name] EMT/HBOT/SD Entered By: [name] on 01/04/2020 17:25:01 -------------------------------------------------------------------------------- Patient/Caregiver Education Details Patient Name: Date of Service: [name] [name], [name] [name] 6/17/2021andnbsp3:00 PM Medical Record Number: [id number] Patient Account Number: [id number] Date of Birth/Gender: Treating RN: 02-23-1961 (59 y.o. [name] [name] Primary Care Physician: [name] Other Clinician: [name] Referring Physician: Treating Physician/Extender: Renard Hamper in Treatment: 1 Education Assessment Education Provided To: Patient Education Topics Provided Hyperbaric Oxygenation: Methods: Explain/Verbal Responses: State content correctly Electronic Signature(s) Signed: 01/04/2020 5:25:18 PM By: [name] EMT/HBOT/SD Entered By: [name] on 01/04/2020 17:24:50 -------------------------------------------------------------------------------- Vitals Details Patient Name: Date of Service: [name] Coe. 01/04/2020 3:00 PM Medical Record Number: [id number] Patient Account Number: [id number] Date of Birth/Sex: Treating RN: 1961/06/25 (59 y.o. [name] [name], [name] Primary Care Oland Arquette: [name] Other Clinician: [name] Referring Tanyiah Laurich: Treating Brieanna Nau/Extender: [name] Weeks in Treatment: 1 Vital Signs Time Taken: 14:58 Temperature (F): 98.2 Height (in): 75 Pulse (bpm): 106 Weight (lbs): 190 Respiratory Rate (breaths/min): 16 Body Mass Index (BMI): 23.7 Blood Pressure (mmHg): 137/98 Reference Range: 80 - 120 mg / dl Electronic Signature(s) Signed: 01/05/2020 8:50:29 AM By: [name] Entered By: [name] on 01/04/2020 15:31:09

## 2020-01-05 NOTE — Progress Notes (Signed)
[name], [name] ([id number]) Visit Report for 01/02/2020 HBO Details Patient Name: Date of Service: [name] [name], [name] [name] 01/02/2020 3:00 PM Medical Record Number: [id number] Patient Account Number: [id number] Date of Birth/Sex: Treating RN: [birth year] (58 y.o. 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 HBO Treatment Details Treatment Number: 1 Patient Type: Outpatient Chamber Type: Monoplace Chamber Serial #: [id number] Treatment Protocol: 2.5 ATA with 90 minutes oxygen, with two 5 minute air breaks Treatment Details Compression Rate Down: 1.0 psi / minute De-Compression Rate Up: 2.0 psi / minute A breaks and breathing ir Compress Tx Pressure periods Decompress Decompress Begins Reached (leave unused spaces Begins Ends blank) Chamber Pressure (ATA 1 2.5 2.5 2.5 2.5 2.5 - - 2.5 1 ) Clock Time (24 hr) 15:27 15:47 16:17 16:22 16:52 16:57 - - 17:27 17:39 Treatment Length: 132 (minutes) Treatment Segments: 4 Vital Signs Capillary Blood Glucose Reference Range: 80 - 120 mg / dl HBO Diabetic Blood Glucose Intervention Range: <131 mg/dl or >249 mg/dl Time Vitals Blood Respiratory Capillary Blood Glucose Pulse Action Type: Pulse: Temperature: Taken: Pressure: Rate: Glucose (mg/dl): Meter #: Oximetry (%) Taken: Pre 15:15 116/77 73 16 97.8 Post 17:41 104/76 82 14 97.9 Treatment Response Treatment Toleration: Well Treatment Completion Status: Treatment Completed without Adverse Event Kerianna Rawlinson Notes Patient completed first hyperbaric treatment. Tolerated well. Tympanic membranes were obscured by cerumen. Respiratory and cardiac  exams unremarkable Physician HBO Attestation: I certify that I supervised this HBO treatment in accordance with Medicare guidelines. A trained emergency response team is readily available per Yes hospital policies and procedures. Continue HBOT as ordered. Yes Electronic Signature(s) Signed: 01/02/2020 5:55:30 PM By: Linton Ham MD Previous Signature: 01/02/2020 5:48:17 PM Version By: [name] EMT/HBOT/SD Entered By: Linton Ham on 01/02/2020 17:54:03 -------------------------------------------------------------------------------- HBO Safety Checklist Details Patient Name: Date of Service: [name] Coe. 01/02/2020 3:00 PM Medical Record Number: [id number] Patient Account Number: [id number] Date of Birth/Sex: Treating RN: 09/26/1960 (58 y.o. [name]) [name] Primary Care Taras Rask: [name] Other Clinician: Referring Denney Shein: Treating Nitesh Pitstick/Extender: [name] Weeks in Treatment: 1 HBO Safety Checklist Items Safety Checklist Consent Form Signed Patient voided / foley secured and emptied When did you last eato N/A Last dose of injectable or oral agent Ostomy pouch emptied and vented if applicable NA All implantable devices assessed, documented and approved NA Intravenous access site secured and place NA Valuables secured Linens and cotton and cotton/polyester blend (less than 51% polyester) Personal oil-based products / skin lotions / body lotions removed Wigs or hairpieces removed NA Smoking or tobacco materials removed NA Books / newspapers / magazines / loose paper removed Cologne, aftershave, perfume and deodorant removed Jewelry removed (may wrap wedding band) Make-up removed Hair care products removed Battery operated devices (external) removed NA Heating patches and chemical warmers removed NA Titanium eyewear removed NA Nail polish cured greater than 10 hours NA Casting material cured greater than 10  hours NA Hearing aids removed NA Loose dentures or partials removed NA Prosthetics have been removed NA Patient demonstrates correct use of air break device (if applicable) Patient concerns have been addressed Patient grounding bracelet on and cord attached to chamber Specifics for Inpatients (complete in addition to above)  Medication sheet sent with patient Intravenous medications needed or due during therapy sent with patient Drainage tubes (e.g. nasogastric tube or chest tube secured and vented) Endotracheal or Tracheotomy tube secured Cuff deflated of air and inflated with saline Airway suctioned Electronic Signature(s) Signed: 01/05/2020 8:50:29 AM By: [name] Entered By: [name] on 01/02/2020 15:24:53

## 2020-01-05 NOTE — Progress Notes (Signed)
[name] [name], [name] [name] ([id number]) Visit Report for 01/02/2020 Arrival Information Details Patient Name: Date of Service: [name] [name], [name] [name] 01/02/2020 3:00 PM Medical Record Number: [id number] Patient Account Number: [id number] Date of Birth/Sex: Treating RN: May 16, 1961 (58 y.o. [name] [name]) [name] Primary Care [name] [name]: [name] [name] Other Clinician: Minerva [name] Referring [place] Festa: Treating Miabella Shannahan/Extender: [name] [name] Weeks in Treatment: 1 Visit Information History Since Last Visit Added or deleted any medications: No Patient Arrived: Ambulatory Any new allergies or adverse reactions: No Arrival Time: 15:00 Had a fall or experienced change in No Accompanied By: self activities of daily living that may affect Transfer Assistance: None risk of falls: Patient Identification Verified: Yes Signs or symptoms of abuse/neglect since last visito No Secondary Verification Process Completed: Yes Hospitalized since last visit: No Patient Requires Transmission-Based Precautions: No Implantable device outside of the clinic excluding No Patient Has Alerts: No cellular tissue based products placed in the center since last visit: Pain Present Now: No Electronic Signature(s) Signed: 01/05/2020 8:50:29 AM By: [name] [name] Entered By: [name] [name] on 01/02/2020 15:23:01 -------------------------------------------------------------------------------- Encounter Discharge Information Details Patient Name: Date of Service: [name] Coe. 01/02/2020 3:00 PM Medical Record Number: [id number] Patient Account Number: [id number] Date of Birth/Sex: Treating RN: 11/16/1960 (58 y.o. [name] [name] Primary Care Rhyder Bratz: [name] [name] Other Clinician: [name] Referring Darrell Hauk: Treating Vauda Salvucci/Extender: [name] [name] Weeks in Treatment: 1 Encounter Discharge Information Items Discharge Condition: Stable Ambulatory Status:  Ambulatory Discharge Destination: Home Transportation: Private Auto Accompanied By: self Schedule Follow-up Appointment: Yes Clinical Summary of Care: Patient Declined Electronic Signature(s) Signed: 01/02/2020 5:48:17 PM By: [name] [name] EMT/HBOT/SD Entered By: [name] [name] on 01/02/2020 17:47:59 -------------------------------------------------------------------------------- Patient/Caregiver Education Details Patient Name: Date of Service: [name] Coe 6/15/2021andnbsp3:00 PM Medical Record Number: [id number] Patient Account Number: [id number] Date of Birth/Gender: Treating RN: [birth year] (58 y.o. [name] [name] Primary Care Physician: [name] [name] Other Clinician: [name] Referring Physician: Treating Physician/Extender: [name] [name] Weeks in Treatment: 1 Education Assessment Education Provided To: Patient Education Topics Provided Hyperbaric Oxygenation: Methods: Explain/Verbal Responses: State content correctly Electronic Signature(s) Signed: 01/02/2020 5:48:17 PM By: [name] [name] EMT/HBOT/SD Entered By: [name] [name] on 01/02/2020 17:47:47 -------------------------------------------------------------------------------- Vitals Details Patient Name: Date of Service: [name] Coe. 01/02/2020 3:00 PM Medical Record Number: [id number] Patient Account Number: [id number] Date of Birth/Sex: Treating RN: [birth year] (58 y.o. [name] [name] Primary Care Kamiryn Bezanson: [name] [name] Other Clinician: Minerva [name] Referring Amariyon Maynes: Treating Auda Finfrock/Extender: Birdena Crandall, RYLEE Weeks in Treatment: 1 Vital Signs Time Taken: 15:15 Temperature (F): 97.8 Height (in): 75 Pulse (bpm): 73 Weight (lbs): 190 Respiratory Rate (breaths/min): 16 Body Mass Index (BMI): 23.7 Blood Pressure (mmHg): 116/77 Reference Range: 80 - 120 mg / dl Electronic Signature(s) Signed: 01/05/2020 8:50:29 AM By: [name] [name] Entered By: [name] [name] on 01/02/2020 15:23:32

## 2020-01-05 NOTE — Progress Notes (Signed)
[name] [name], [name] [name] ([id number]) Visit Report for 01/03/2020 HBO Details Patient Name: Date of Service: [name] [name], [name] [name] 01/03/2020 3:00 PM Medical Record Number: [id number] Patient Account Number: [id number] Date of Birth/Sex: Treating RN: Mar 26, 1961 (59 y.o. 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 HBO [name] Details [name] Number: 2 Patient Type: Outpatient Chamber Type: Monoplace Chamber Serial #: [id number] [name] Protocol: 2.5 ATA with 90 minutes oxygen, with two 5 minute air breaks [name] Details Compression Rate Down: 2.0 psi / minute De-Compression Rate Up: 3.0 psi / minute A breaks and breathing ir Compress Tx Pressure periods Decompress Decompress Begins Reached (leave unused spaces Begins Ends blank) Chamber Pressure (ATA 1 2.5 2.5 2.5 2.5 2.5 - - 2.5 1 ) Clock Time (24 hr) 15:03 15:16 15:46 15:51 16:21 16:26 - - 16:56 17:02 [name] Length: 119 (minutes) [name] Segments: 4 Vital Signs Capillary Blood Glucose Reference Range: 80 - 120 mg / dl HBO Diabetic Blood Glucose Intervention Range: <131 mg/dl or >249 mg/dl Time Vitals Blood Respiratory Capillary Blood Glucose Pulse Action Type: Pulse: Temperature: Taken: Pressure: Rate: Glucose (mg/dl): Meter #: Oximetry (%) Taken: Pre 14:50 117/85 94 18 98.4 Post 17:05 147/59 95 16 98.1 [name] Response [name] Toleration: Well [name] Completion Status: [name] Completed without Adverse Event Electronic Signature(s) Signed: 01/03/2020 5:15:28 PM By: [name] [name] Signed: 01/04/2020 1:39:37 PM By: [name] [name] Entered By: [name] [name] on 01/03/2020 17:14:13 -------------------------------------------------------------------------------- HBO Safety Checklist Details Patient Name: Date of Service: [name] [name]. 01/03/2020 3:00 PM Medical Record Number: [id number] Patient Account Number: [id number] Date of Birth/Sex: Treating RN: 09-23-1960 (59 y.o. M) Primary Care Zakara Parkey: [name] [name] Other Clinician: [name] Referring Macdonald Rigor: Treating Amillya Chavira/Extender: [name] [name], RYLEE Weeks in [name]: 1 HBO Safety Checklist Items Safety Checklist Consent Form Signed Patient voided / foley secured and emptied When did you last eato N/A Last dose of injectable or oral agent Ostomy pouch emptied and vented if applicable NA All implantable devices assessed, documented and approved NA Intravenous access site secured and place NA Valuables secured Linens and cotton and cotton/polyester blend (less than 51% polyester) Personal oil-based products / skin lotions / body lotions removed Wigs or hairpieces removed NA Smoking or tobacco materials removed NA Books / newspapers / magazines / loose paper removed Cologne, aftershave, perfume and deodorant removed Jewelry removed (may wrap wedding band) Make-up removed NA Hair care products removed NA Battery operated devices (external) removed NA Heating patches and chemical warmers removed NA Titanium eyewear removed NA Nail polish cured greater than 10 hours NA Casting material cured greater than 10 hours NA Hearing aids removed NA Loose dentures or partials removed NA Prosthetics have been removed NA Patient demonstrates correct use of air break device (if applicable) Patient concerns have been addressed Patient grounding bracelet on and cord attached to chamber Specifics for Inpatients (complete in addition to above) Medication sheet sent with patient Intravenous medications needed or due during therapy sent with  patient Drainage tubes (e.g. nasogastric tube or chest tube secured and vented) Endotracheal or Tracheotomy tube secured Cuff deflated of air and inflated with saline Airway suctioned Electronic Signature(s) Signed: 01/05/2020 8:50:29 AM By: [name] Entered By: [name]  on 01/03/2020 16:56:16

## 2020-01-05 NOTE — Progress Notes (Signed)
[name] [name], [name] [name] ([id number]) Visit Report for 01/04/2020 HBO Details Patient Name: Date of Service: [name] [name] 01/04/2020 3:00 PM Medical Record Number: [id number] Patient Account Number: [id number] Date of Birth/Sex: Treating RN: July 22, 1960 (59 y.o. 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 HBO Treatment Details Treatment Number: 3 Patient Type: Outpatient Chamber Type: Monoplace Chamber Serial #: [id number] Treatment Protocol: 2.5 ATA with 90 minutes oxygen, with two 5 minute air breaks Treatment Details Compression Rate Down: 2.0 psi / minute De-Compression Rate Up: 3.0 psi / minute A breaks and breathing ir Compress Tx Pressure periods Decompress Decompress Begins Reached (leave unused spaces Begins Ends blank) Chamber Pressure (ATA 1 2.5 2.5 2.5 2.5 2.5 - - 2.5 1 ) Clock Time (24 hr) 15:13 15:25 15:55 16:00 16:30 16:35 - - 17:05 17:11 Treatment Length: 118 (minutes) Treatment Segments: 4 Vital Signs Capillary Blood Glucose Reference Range: 80 - 120 mg / dl HBO Diabetic Blood Glucose Intervention Range: <131 mg/dl or >249 mg/dl Time Vitals Blood Respiratory Capillary Blood Glucose Pulse Action Type: Pulse: Temperature: Taken: Pressure: Rate: Glucose (mg/dl): Meter #: Oximetry (%) Taken: Pre 14:58 137/98 106 16 98.2 Post 17:13 110/70 93 14 98.4 Treatment Response Treatment Toleration: Well Treatment Completion Status: Treatment Completed without Adverse Event Rayya Yagi Notes No concerns with treatment given Physician HBO Attestation: I certify that I supervised this HBO treatment in accordance with  Medicare guidelines. A trained emergency response team is readily available per Yes hospital policies and procedures. Continue HBOT as ordered. Yes Electronic Signature(s) Signed: 01/05/2020 7:45:08 AM By: Linton Ham MD Previous Signature: 01/04/2020 5:25:18 PM Version By: [name] EMT/HBOT/SD Entered By: Linton Ham on 01/04/2020 17:58:01 -------------------------------------------------------------------------------- HBO Safety Checklist Details Patient Name: Date of Service: [name] [name]. 01/04/2020 3:00 PM Medical Record Number: [id number] Patient Account Number: [id number] Date of Birth/Sex: Treating RN: 09/19/1960 (59 y.o. [name], [name] Primary Care Merikay Lesniewski: [name] Other Clinician: [name] Referring Temperence Zenor: Treating Ammy Lienhard/Extender: [name] Weeks in Treatment: 1 HBO Safety Checklist Items Safety Checklist Consent Form Signed Patient voided / foley secured and emptied When did you last eato N/A Last dose of injectable or oral agent Ostomy pouch emptied and vented if applicable NA All implantable devices assessed, documented and approved NA Intravenous access site secured and place NA Valuables secured Linens and cotton and cotton/polyester blend (less than 51% polyester) Personal oil-based products / skin lotions / body lotions removed Wigs or hairpieces removed NA Smoking or tobacco materials removed NA Books / newspapers / magazines / loose paper removed Cologne, aftershave, perfume and deodorant removed Jewelry removed (may wrap wedding band) Make-up removed NA Hair care products removed NA Battery operated devices (external) removed NA Heating patches and chemical warmers removed NA Titanium eyewear removed NA Nail polish cured greater than 10 hours NA Casting material cured greater than 10 hours NA Hearing aids removed NA Loose dentures or partials removed NA Prosthetics have  been removed NA Patient demonstrates correct use of air break device (if applicable) Patient concerns have been addressed Patient grounding bracelet on and cord attached to chamber Specifics for Inpatients (complete in addition to above) Medication sheet sent with patient Intravenous medications needed or  due during therapy sent with patient Drainage tubes (e.g. nasogastric tube or chest tube secured and vented) Endotracheal or Tracheotomy tube secured Cuff deflated of air and inflated with saline Airway suctioned Electronic Signature(s) Signed: 01/05/2020 8:50:29 AM By: [name] Entered By: [name] on 01/04/2020 15:31:52

## 2020-01-05 NOTE — Progress Notes (Signed)
IMER, FOXWORTH ([id number]) Visit Report for 01/03/2020 Arrival Information Details Patient Name: Date of Service: [name] [name], [name] [name] 01/03/2020 3:00 PM Medical Record Number: [id number] Patient Account Number: [id number] Date of Birth/Sex: Treating RN: July 22, 1960 (59 y.o. M) Primary Care Jaeven Wanzer: [name] Other Clinician: [name] Referring Oluwatimilehin Balfour: Treating Future Yeldell/Extender: [name] Weeks in Treatment: 1 Visit Information History Since Last Visit Added or deleted any medications: No Patient Arrived: Ambulatory Any new allergies or adverse reactions: No Arrival Time: 14:48 Had a fall or experienced change in No Accompanied By: self activities of daily living that may affect Transfer Assistance: None risk of falls: Patient Identification Verified: Yes Signs or symptoms of abuse/neglect since last visito No Secondary Verification Process Completed: Yes Hospitalized since last visit: No Patient Requires Transmission-Based Precautions: No Implantable device outside of the clinic excluding No Patient Has Alerts: No cellular tissue based products placed in the center since last visit: Pain Present Now: No Electronic Signature(s) Signed: 01/05/2020 8:50:29 AM By: [name] Entered By: [name] on 01/03/2020 14:52:05 -------------------------------------------------------------------------------- Encounter Discharge Information Details Patient Name: Date of Service: [name] Coe. 01/03/2020 3:00 PM Medical Record Number: [id number] Patient Account Number: [id number] Date of Birth/Sex: Treating RN: 06-27-1961 (59 y.o. M) Primary Care Wadsworth Skolnick: [name] Other Clinician: [name] Referring Yeray Tomas: Treating Deovion Batrez/Extender: [name] Weeks in Treatment: 1 Encounter Discharge Information Items Discharge Condition: Stable Ambulatory Status: Ambulatory Discharge Destination:  Home Transportation: Private Auto Accompanied By: self Schedule Follow-up Appointment: Yes Clinical Summary of Care: Patient Declined Electronic Signature(s) Signed: 01/03/2020 5:15:28 PM By: [name] EMT/HBOT/SD Entered By: [name] on 01/03/2020 17:14:55 -------------------------------------------------------------------------------- Patient/Caregiver Education Details Patient Name: Date of Service: [name] [name], [name] [name] 6/16/2021andnbsp3:00 PM Medical Record Number: [id number] Patient Account Number: [id number] Date of Birth/Gender: Treating RN: 1960/08/05 (59 y.o. M) Primary Care Physician: [name] Other Clinician: [name] Referring Physician: Treating Physician/Extender: [name] Weeks in Treatment: 1 Education Assessment Education Provided To: Patient Education Topics Provided Hyperbaric Oxygenation: Methods: Explain/Verbal Responses: State content correctly Electronic Signature(s) Signed: 01/03/2020 5:15:28 PM By: [name] EMT/HBOT/SD Entered By: [name] on 01/03/2020 17:14:44 -------------------------------------------------------------------------------- Vitals Details Patient Name: Date of Service: [name] Coe. 01/03/2020 3:00 PM Medical Record Number: [id number] Patient Account Number: [id number] Date of Birth/Sex: Treating RN: [birth year] (59 y.o. M) Primary Care Gawain Crombie: [name] Other Clinician: [name] Referring Jorgina Binning: Treating Hafsah Hendler/Extender: Eloy End, RYLEE Weeks in Treatment: 1 Vital Signs Time Taken: 14:50 Temperature (F): 98.4 Height (in): 75 Pulse (bpm): 94 Weight (lbs): 190 Respiratory Rate (breaths/min): 18 Body Mass Index (BMI): 23.7 Blood Pressure (mmHg): 117/85 Reference Range: 80 - 120 mg / dl Electronic Signature(s) Signed: 01/05/2020 8:50:29 AM By: [name] Entered By: [name] on 01/03/2020 14:52:30

## 2020-01-05 NOTE — Progress Notes (Signed)
SENICA, CRALL ([id number]) Visit Report for 01/04/2020 SuperBill Details Patient Name: Date of Service: KELDEN, LAVALLEE 01/04/2020 Medical Record Number: [id number] Patient Account Number: [id number] Date of Birth/Sex: Treating RN: 02-07-1961 (59 y.o. 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

## 2020-01-08 ENCOUNTER — Encounter: Payer: Medicaid Other | Admitting: Rehabilitation

## 2020-01-08 ENCOUNTER — Encounter (HOSPITAL_BASED_OUTPATIENT_CLINIC_OR_DEPARTMENT_OTHER): Payer: Medicaid Other | Admitting: Internal Medicine

## 2020-01-08 DIAGNOSIS — L598 Other specified disorders of the skin and subcutaneous tissue related to radiation: Secondary | ICD-10-CM | POA: Diagnosis not present

## 2020-01-08 DIAGNOSIS — M272 Inflammatory conditions of jaws: Secondary | ICD-10-CM | POA: Diagnosis not present

## 2020-01-08 DIAGNOSIS — C099 Malignant neoplasm of tonsil, unspecified: Secondary | ICD-10-CM | POA: Diagnosis not present

## 2020-01-08 NOTE — Progress Notes (Signed)
SHI, BLANKENSHIP ([id number]) Visit Report for 01/08/2020 HBO Details Patient Name: Date of Service: [name] [name], [name] [name] 01/08/2020 3:00 PM Medical Record Number: [id number] Patient Account Number: [id number] Date of Birth/Sex: Treating RN: 1960/11/17 (59 y.o. 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 HBO Treatment Details Treatment Number: 4 Patient Type: Outpatient Chamber Type: Monoplace Chamber Serial #: [id number] Treatment Protocol: 2.5 ATA with 90 minutes oxygen, with two 5 minute air breaks Treatment Details Compression Rate Down: 2.0 psi / minute De-Compression Rate Up: 3.0 psi / minute A breaks and breathing ir Compress Tx Pressure periods Decompress Decompress Begins Reached (leave unused spaces Begins Ends blank) Chamber Pressure (ATA 1 2.5 2.5 2.5 2.5 2.5 - - 2.5 1 ) Clock Time (24 hr) 15:07 15:19 15:49 15:54 16:24 16:29 - - 16:59 17:05 Treatment Length: 118 (minutes) Treatment Segments: 4 Vital Signs Capillary Blood Glucose Reference Range: 80 - 120 mg / dl HBO Diabetic Blood Glucose Intervention Range: <131 mg/dl or >249 mg/dl Time Vitals Blood Respiratory Capillary Blood Glucose Pulse Action Type: Pulse: Temperature: Taken: Pressure: Rate: Glucose (mg/dl): Meter #: Oximetry (%) Taken: Pre 14:55 132/88 74 16 97.9 Post 17:07 135/98 90 18 97.6 Treatment Response Treatment Toleration: Well Treatment Completion Status: Treatment Completed without Adverse Event Tyreshia Ingman Notes No concerns with treatment given Physician HBO Attestation: I certify that I supervised this HBO treatment in accordance with  Medicare guidelines. A trained emergency response team is readily available per Yes hospital policies and procedures. Continue HBOT as ordered. Yes Electronic Signature(s) Signed: 01/08/2020 6:03:41 PM By: Linton Ham MD Previous Signature: 01/08/2020 5:11:39 PM Version By: [name] Entered By: Linton Ham on 01/08/2020 18:01:58 -------------------------------------------------------------------------------- HBO Safety Checklist Details Patient Name: Date of Service: [name] [name], [name] [name] 01/08/2020 3:00 PM Medical Record Number: [id number] Patient Account Number: [id number] Date of Birth/Sex: Treating RN: 06-27-1961 (59 y.o. [name] Primary Care Aedyn Mckeon: [name] Other Clinician: [name] Referring Jonell Krontz: Treating Olesya Wike/Extender: [name] Weeks in Treatment: 2 HBO Safety Checklist Items Safety Checklist Consent Form Signed Patient voided / foley secured and emptied When did you last eato N/A Last dose of injectable or oral agent Ostomy pouch emptied and vented if applicable NA All implantable devices assessed, documented and approved NA Intravenous access site secured and place NA Valuables secured Linens and cotton and cotton/polyester blend (less than 51% polyester) Personal oil-based products / skin lotions / body lotions removed Wigs or hairpieces removed NA Smoking or tobacco materials removed NA Books / newspapers / magazines / loose paper removed Cologne, aftershave, perfume and deodorant removed Jewelry removed (may wrap wedding band) Make-up removed NA Hair care products removed NA Battery operated devices (external) removed NA Heating patches and chemical warmers removed NA Titanium eyewear removed NA Nail polish cured greater than 10 hours NA Casting material cured greater than 10 hours NA Hearing aids removed NA Loose dentures or partials removed NA Prosthetics have been  removed NA Patient demonstrates correct use of air break device (if applicable) Patient concerns have been addressed Patient grounding bracelet on and cord attached to chamber Specifics for Inpatients (complete in addition to above) Medication sheet sent with patient Intravenous medications needed or due  during therapy sent with patient Drainage tubes (e.g. nasogastric tube or chest tube secured and vented) Endotracheal or Tracheotomy tube secured Cuff deflated of air and inflated with saline Airway suctioned Electronic Signature(s) Signed: 01/08/2020 5:11:39 PM By: [name] Entered By: [name] on 01/08/2020 15:52:48

## 2020-01-08 NOTE — Progress Notes (Addendum)
[name] [name], [name] [name] ([id number]) Visit Report for 01/08/2020 SuperBill Details Patient Name: Date of Service: [name] [name], [name] [name] 01/08/2020 Medical Record Number: [id number] Patient Account Number: [id number] Date of Birth/Sex: Treating RN: 11-21-1960 (59 y.o. 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

## 2020-01-08 NOTE — Progress Notes (Signed)
[name] [name], [name] [name] ([id number]) Visit Report for 01/08/2020 Arrival Information Details Patient Name: Date of Service: [name] [name], [name] [name] 01/08/2020 3:00 PM Medical Record Number: [id number] Patient Account Number: [id number] Date of Birth/Sex: Treating RN: 03-Jun-1961 (59 y.o. Jonette Eva, Briant Cedar Primary Care Angelos Wasco: [name] Other Clinician: [name] Referring Brandey Vandalen: Treating Tylar Amborn/Extender: [name] Weeks in Treatment: 2 Visit Information History Since Last Visit Added or deleted any medications: No Patient Arrived: Ambulatory Any new allergies or adverse reactions: No Arrival Time: 14:52 Had a fall or experienced change in No Accompanied By: self activities of daily living that may affect Transfer Assistance: None risk of falls: Patient Identification Verified: Yes Signs or symptoms of abuse/neglect since last visito No Secondary Verification Process Completed: Yes Hospitalized since last visit: No Patient Requires Transmission-Based Precautions: No Implantable device outside of the clinic excluding No Patient Has Alerts: No cellular tissue based products placed in the center since last visit: Pain Present Now: No Electronic Signature(s) Signed: 01/08/2020 5:11:39 PM By: [name] Entered By: [name] on 01/08/2020 15:51:46 -------------------------------------------------------------------------------- Encounter Discharge Information Details Patient Name: Date of Service: [name] Coe. 01/08/2020 3:00 PM Medical Record Number: [id number] Patient Account Number: [id number] Date of Birth/Sex: Treating RN: 1960-10-18 (59 y.o. [name] Primary Care Dellis Voght: [name] Other Clinician: [name] Referring Lamone Ferrelli: Treating Seraj Dunnam/Extender: [name] Weeks in Treatment: 2 Encounter Discharge Information Items Discharge Condition: Stable Ambulatory Status:  Ambulatory Discharge Destination: Home Transportation: Private Auto Accompanied By: self Schedule Follow-up Appointment: Yes Clinical Summary of Care: Patient Declined Electronic Signature(s) Signed: 01/08/2020 5:11:39 PM By: [name] Entered By: [name] on 01/08/2020 17:11:16 -------------------------------------------------------------------------------- Patient/Caregiver Education Details Patient Name: Date of Service: [name] [name], [name] [name] 6/21/2021andnbsp3:00 PM Medical Record Number: [id number] Patient Account Number: [id number] Date of Birth/Gender: Treating RN: [birth year] (59 y.o. [name] Primary Care Physician: [name] Other Clinician: [name] Referring Physician: Treating Physician/Extender: Renard Hamper in Treatment: 2 Education Assessment Education Provided To: Patient Education Topics Provided Hyperbaric Oxygenation: Methods: Explain/Verbal Responses: State content correctly Electronic Signature(s) Signed: 01/08/2020 5:11:39 PM By: [name] Entered By: [name] on 01/08/2020 17:10:59 -------------------------------------------------------------------------------- Vitals Details Patient Name: Date of Service: [name] Coe. 01/08/2020 3:00 PM Medical Record Number: [id number] Patient Account Number: [id number] Date of Birth/Sex: Treating RN: 22-Jun-1961 (59 y.o. [name] Primary Care Letroy Vazguez: [name] Other Clinician: [name] Referring Aviyah Swetz: Treating Laquanna Veazey/Extender: [name] Weeks in Treatment: 2 Vital Signs Time Taken: 14:55 Temperature (F): 97.9 Height (in): 75 Pulse (bpm): 74 Weight (lbs): 190 Respiratory Rate (breaths/min): 16 Body Mass Index (BMI): 23.7 Blood Pressure (mmHg): 132/88 Reference Range: 80 - 120 mg / dl Electronic Signature(s) Signed: 01/08/2020 5:11:39 PM By: [name] Entered By: [name] on  01/08/2020 15:52:13

## 2020-01-09 ENCOUNTER — Encounter (HOSPITAL_BASED_OUTPATIENT_CLINIC_OR_DEPARTMENT_OTHER): Payer: Medicaid Other | Admitting: Internal Medicine

## 2020-01-09 DIAGNOSIS — M272 Inflammatory conditions of jaws: Secondary | ICD-10-CM | POA: Diagnosis not present

## 2020-01-09 DIAGNOSIS — C099 Malignant neoplasm of tonsil, unspecified: Secondary | ICD-10-CM | POA: Diagnosis not present

## 2020-01-09 DIAGNOSIS — L598 Other specified disorders of the skin and subcutaneous tissue related to radiation: Secondary | ICD-10-CM | POA: Diagnosis not present

## 2020-01-09 NOTE — Progress Notes (Signed)
[name], [name] ([id number]) Visit Report for 01/09/2020 Arrival Information Details Patient Name: Date of Service: [name] [name], [name] [name] 01/09/2020 3:00 PM Medical Record Number: [id number] Patient Account Number: [id number] Date of Birth/Sex: Treating RN: [birth year] (59 y.o. [name] [name]) [name] Primary Care [name] [name]: [name] Other Clinician: Referring [name] [name]: Treating Britaney Espaillat/Extender: [name] Weeks in Treatment: 2 Visit Information History Since Last Visit Added or deleted any medications: No Patient Arrived: Ambulatory Any new allergies or adverse reactions: No Arrival Time: 14:50 Had a fall or experienced change in No Accompanied By: wife activities of daily living that may affect Transfer Assistance: None risk of falls: Patient Identification Verified: Yes Signs or symptoms of abuse/neglect since last visito No Secondary Verification Process Completed: Yes Hospitalized since last visit: No Patient Requires Transmission-Based Precautions: No Implantable device outside of the clinic excluding No Patient Has Alerts: No cellular tissue based products placed in the center since last visit: Pain Present Now: No Electronic Signature(s) Signed: 01/09/2020 5:21:45 PM By: [name] Entered By: [name] on 01/09/2020 15:23:24 -------------------------------------------------------------------------------- Encounter Discharge Information Details Patient Name: Date of Service: [name] Coe. 01/09/2020 3:00 PM Medical Record Number: [id number] Patient Account Number: [id number] Date of Birth/Sex: Treating RN: [birth year] (58 y.o. [name] [name] Primary Care [name] [name]: [name] Other Clinician: [name] Referring [name] [name]: Treating [name] [name]/Extender: [name] Weeks in Treatment: 2 Encounter Discharge Information Items Discharge Condition: Stable Ambulatory Status: Ambulatory Discharge Destination:  Home Transportation: Private Auto Accompanied By: self Schedule Follow-up Appointment: Yes Clinical Summary of Care: Patient Declined Electronic Signature(s) Signed: 01/09/2020 5:21:45 PM By: [name] Entered By: [name] on 01/09/2020 17:20:47 -------------------------------------------------------------------------------- Patient/Caregiver Education Details Patient Name: Date of Service: [name] [name], [name] [name] 6/22/2021andnbsp3:00 PM Medical Record Number: [id number] Patient Account Number: [id number] Date of Birth/Gender: Treating RN: [birth year] (58 y.o. [name] [name] Primary Care Physician: [name] Other Clinician: Referring Physician: Treating Physician/Extender: [name] Weeks in Treatment: 2 Education Assessment Education Provided To: Patient Education Topics Provided Hyperbaric Oxygenation: Methods: Explain/Verbal Responses: State content correctly Electronic Signature(s) Signed: 01/09/2020 5:21:45 PM By: [name] Entered By: [name] on 01/09/2020 17:20:31 -------------------------------------------------------------------------------- Vitals Details Patient Name: Date of Service: [name] Coe. 01/09/2020 3:00 PM Medical Record Number: [id number] Patient Account Number: [id number] Date of Birth/Sex: Treating RN: Jan 25, 1961 (58 y.o. [name] [name]) [name] Primary Care Ahlaya Ende: [name] Other Clinician: Referring Liesl Simons: Treating Angla Delahunt/Extender: Birdena Crandall, RYLEE Weeks in Treatment: 2 Vital Signs Time Taken: 14:52 Temperature (F): 98.0 Height (in): 75 Pulse (bpm): 90 Weight (lbs): 190 Respiratory Rate (breaths/min): 16 Body Mass Index (BMI): 23.7 Blood Pressure (mmHg): 116/89 Reference Range: 80 - 120 mg / dl Electronic Signature(s) Signed: 01/09/2020 5:21:45 PM By: [name] Entered By: [name] on 01/09/2020 15:23:56

## 2020-01-09 NOTE — Progress Notes (Addendum)
[name] [name], [name] [name] ([id number]) Visit Report for 01/09/2020 SuperBill Details Patient Name: Date of Service: [name] [name], [name] [name] 01/09/2020 Medical Record Number: [id number] Patient Account Number: [id number] Date of Birth/Sex: Treating RN: July 14, 1961 (58 y.o. 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

## 2020-01-09 NOTE — Progress Notes (Signed)
ANTOLIN, BELSITO ([id number]) Visit Report for 01/09/2020 HBO Details Patient Name: Date of Service: [name] [name], [name] [name] 01/09/2020 3:00 PM Medical Record Number: [id number] Patient Account Number: [id number] Date of Birth/Sex: Treating RN: 10/02/1960 (58 y.o. 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 HBO Treatment Details Treatment Number: 5 Patient Type: Outpatient Chamber Type: Monoplace Chamber Serial #: [id number] Treatment Protocol: 2.5 ATA with 90 minutes oxygen, with two 5 minute air breaks Treatment Details Compression Rate Down: 2.0 psi / minute De-Compression Rate Up: 3.0 psi / minute A breaks and breathing ir Compress Tx Pressure periods Decompress Decompress Begins Reached (leave unused spaces Begins Ends blank) Chamber Pressure (ATA 1 2.5 2.5 2.5 2.5 2.5 - - 2.5 1 ) Clock Time (24 hr) 15:14 15:26 15:56 16:01 16:31 16:36 - - 17:06 17:12 Treatment Length: 118 (minutes) Treatment Segments: 4 Vital Signs Capillary Blood Glucose Reference Range: 80 - 120 mg / dl HBO Diabetic Blood Glucose Intervention Range: <131 mg/dl or >249 mg/dl Time Vitals Blood Respiratory Capillary Blood Glucose Pulse Action Type: Pulse: Temperature: Taken: Pressure: Rate: Glucose (mg/dl): Meter #: Oximetry (%) Taken: Pre 14:52 116/89 90 16 98 Post 17:18 122/98 62 16 98.1 Treatment Response Treatment Toleration: Well Treatment Completion Status: Treatment Completed without Adverse Event Gaven Eugene Notes No concerns with treatment given Physician HBO Attestation: I certify that I supervised this HBO treatment in accordance with  Medicare guidelines. A trained emergency response team is readily available per Yes hospital policies and procedures. Continue HBOT as ordered. Yes Electronic Signature(s) Signed: 01/09/2020 5:20:50 PM By: Linton Ham MD Signed: 01/09/2020 5:21:45 PM By: [name] Previous Signature: 01/09/2020 5:20:04 PM Version By: Linton Ham MD Entered By: [name] on 01/09/2020 17:20:20 -------------------------------------------------------------------------------- HBO Safety Checklist Details Patient Name: Date of Service: [name] [name], [name] [name] 01/09/2020 3:00 PM Medical Record Number: [id number] Patient Account Number: [id number] Date of Birth/Sex: Treating RN: Feb 06, 1961 (58 y.o. [name]) [name] Primary Care Dezaray Shibuya: [name] Other Clinician: [name] Referring Kalilah Barua: Treating Madisin Hasan/Extender: [name] Weeks in Treatment: 2 HBO Safety Checklist Items Safety Checklist Consent Form Signed Patient voided / foley secured and emptied When did you last eato N/A Last dose of injectable or oral agent Ostomy pouch emptied and vented if applicable NA All implantable devices assessed, documented and approved NA Intravenous access site secured and place NA Valuables secured Linens and cotton and cotton/polyester blend (less than 51% polyester) Personal oil-based products / skin lotions / body lotions removed Wigs or hairpieces removed NA Smoking or tobacco materials removed NA Books / newspapers / magazines / loose paper removed Cologne, aftershave, perfume and deodorant removed Jewelry removed (may wrap wedding band) Make-up removed NA Hair care products removed NA Battery operated devices (external) removed NA Heating patches and chemical warmers removed NA Titanium eyewear removed NA Nail polish cured greater than 10 hours NA Casting material cured greater than 10 hours NA Hearing aids removed NA Loose dentures or  partials removed NA Prosthetics have been removed NA Patient demonstrates correct use of air break device (if applicable) Patient concerns have been addressed Patient grounding bracelet on and cord attached to chamber Specifics for Inpatients (complete in addition to above) Medication sheet  sent with patient Intravenous medications needed or due during therapy sent with patient Drainage tubes (e.g. nasogastric tube or chest tube secured and vented) Endotracheal or Tracheotomy tube secured Cuff deflated of air and inflated with saline Airway suctioned Electronic Signature(s) Signed: 01/09/2020 5:21:45 PM By: [name] Entered By: [name] on 01/09/2020 15:24:29

## 2020-01-10 ENCOUNTER — Encounter: Payer: Medicaid Other | Admitting: Rehabilitation

## 2020-01-10 ENCOUNTER — Encounter (HOSPITAL_BASED_OUTPATIENT_CLINIC_OR_DEPARTMENT_OTHER): Payer: Medicaid Other | Admitting: Physician Assistant

## 2020-01-10 DIAGNOSIS — M272 Inflammatory conditions of jaws: Secondary | ICD-10-CM | POA: Diagnosis not present

## 2020-01-10 DIAGNOSIS — C099 Malignant neoplasm of tonsil, unspecified: Secondary | ICD-10-CM | POA: Diagnosis not present

## 2020-01-10 DIAGNOSIS — L598 Other specified disorders of the skin and subcutaneous tissue related to radiation: Secondary | ICD-10-CM | POA: Diagnosis not present

## 2020-01-10 NOTE — Progress Notes (Signed)
[name] [name] ([id number]) Visit Report for 01/10/2020 Arrival Information Details Patient Name: Date of Service: [name] [name], [name] [name] 01/10/2020 3:00 PM Medical Record Number: [id number] Patient Account Number: [id number] Date of Birth/Sex: Treating RN: 1961/04/08 (59 y.o. [name] [name] Primary Care Grecia Lynk: [name] Other Clinician: [name] Referring Zadia Uhde: Treating Claire Dolores/Extender: [name] Weeks in Treatment: 2 Visit Information History Since Last Visit Added or deleted any medications: No Patient Arrived: Ambulatory Any new allergies or adverse reactions: No Arrival Time: 14:55 Had a fall or experienced change in No Accompanied By: self activities of daily living that may affect Transfer Assistance: None risk of falls: Patient Identification Verified: Yes Signs or symptoms of abuse/neglect since last visito No Secondary Verification Process Completed: Yes Hospitalized since last visit: No Patient Requires Transmission-Based Precautions: No Implantable device outside of the clinic excluding No Patient Has Alerts: No cellular tissue based products placed in the center since last visit: Pain Present Now: No Electronic Signature(s) Signed: 01/10/2020 5:20:20 PM By: [name] Entered By: [name] on 01/10/2020 15:33:59 -------------------------------------------------------------------------------- Encounter Discharge Information Details Patient Name: Date of Service: [name] 01/10/2020 3:00 PM Medical Record Number: [id number] Patient Account Number: [id number] Date of Birth/Sex: Treating RN: [birth year] (59 y.o. [name] [name] Primary Care [name] [name]: [name] Other Clinician: [name] Referring [name] [name]: Treating Nathanyl Andujo/Extender: [name] Weeks in Treatment: 2 Encounter Discharge Information Items Discharge Condition: Stable Ambulatory Status:  Ambulatory Discharge Destination: Home Transportation: Private Auto Accompanied By: self Schedule Follow-up Appointment: Yes Clinical Summary of Care: Patient Declined Electronic Signature(s) Signed: 01/10/2020 5:20:20 PM By: [name] Entered By: [name] on 01/10/2020 17:18:48 -------------------------------------------------------------------------------- Patient/Caregiver Education Details Patient Name: Date of Service: [name] [name] 6/23/2021andnbsp3:00 PM Medical Record Number: [id number] Patient Account Number: [id number] Date of Birth/Gender: Treating RN: 08-11-1960 (59 y.o. [name] [name] Primary Care Physician: [name] Other Clinician: [name] Referring Physician: Treating Physician/Extender: [name] Weeks in Treatment: 2 Education Assessment Education Provided To: Patient Education Topics Provided Hyperbaric Oxygenation: Methods: Explain/Verbal Responses: State content correctly Electronic Signature(s) Signed: 01/10/2020 5:20:20 PM By: [name] Entered By: [name] on 01/10/2020 17:18:30 -------------------------------------------------------------------------------- Vitals Details Patient Name: Date of Service: [name] 01/10/2020 3:00 PM Medical Record Number: [id number] Patient Account Number: [id number] Date of Birth/Sex: Treating RN: 03-Mar-1961 (59 y.o. [name] [name] Primary Care Syncere Eble: [name] Other Clinician: [name] Referring [name] [name]: Treating Bobbie Valletta/Extender: Eloy End, RYLEE Weeks in Treatment: 2 Vital Signs Time Taken: 15:05 Temperature (F): 97.7 Height (in): 75 Pulse (bpm): 71 Weight (lbs): 190 Respiratory Rate (breaths/min): 16 Body Mass Index (BMI): 23.7 Blood Pressure (mmHg): 121/89 Reference Range: 80 - 120 mg / dl Electronic Signature(s) Signed: 01/10/2020 5:20:20 PM By: [name] Entered By: [name] on  01/10/2020 15:34:26

## 2020-01-11 ENCOUNTER — Other Ambulatory Visit: Payer: Self-pay

## 2020-01-11 ENCOUNTER — Encounter (HOSPITAL_BASED_OUTPATIENT_CLINIC_OR_DEPARTMENT_OTHER): Payer: Medicaid Other | Admitting: Internal Medicine

## 2020-01-11 DIAGNOSIS — C099 Malignant neoplasm of tonsil, unspecified: Secondary | ICD-10-CM | POA: Diagnosis not present

## 2020-01-11 DIAGNOSIS — M272 Inflammatory conditions of jaws: Secondary | ICD-10-CM | POA: Diagnosis not present

## 2020-01-11 DIAGNOSIS — L598 Other specified disorders of the skin and subcutaneous tissue related to radiation: Secondary | ICD-10-CM | POA: Diagnosis not present

## 2020-01-12 ENCOUNTER — Other Ambulatory Visit: Payer: Self-pay

## 2020-01-12 ENCOUNTER — Encounter (HOSPITAL_BASED_OUTPATIENT_CLINIC_OR_DEPARTMENT_OTHER): Payer: Medicaid Other | Admitting: Internal Medicine

## 2020-01-12 ENCOUNTER — Inpatient Hospital Stay: Payer: Medicaid Other | Attending: Hematology and Oncology | Admitting: Hematology and Oncology

## 2020-01-12 ENCOUNTER — Other Ambulatory Visit: Payer: Self-pay | Admitting: Hematology and Oncology

## 2020-01-12 ENCOUNTER — Inpatient Hospital Stay: Payer: Medicaid Other

## 2020-01-12 VITALS — BP 132/95 | HR 105 | Temp 97.4°F | Resp 18 | Ht 75.0 in | Wt 195.4 lb

## 2020-01-12 DIAGNOSIS — Z79899 Other long term (current) drug therapy: Secondary | ICD-10-CM | POA: Diagnosis not present

## 2020-01-12 DIAGNOSIS — C090 Malignant neoplasm of tonsillar fossa: Secondary | ICD-10-CM | POA: Diagnosis not present

## 2020-01-12 DIAGNOSIS — B370 Candidal stomatitis: Secondary | ICD-10-CM | POA: Insufficient documentation

## 2020-01-12 DIAGNOSIS — Z7289 Other problems related to lifestyle: Secondary | ICD-10-CM | POA: Insufficient documentation

## 2020-01-12 DIAGNOSIS — Z7901 Long term (current) use of anticoagulants: Secondary | ICD-10-CM | POA: Insufficient documentation

## 2020-01-12 DIAGNOSIS — Z823 Family history of stroke: Secondary | ICD-10-CM | POA: Insufficient documentation

## 2020-01-12 DIAGNOSIS — K1231 Oral mucositis (ulcerative) due to antineoplastic therapy: Secondary | ICD-10-CM | POA: Diagnosis not present

## 2020-01-12 DIAGNOSIS — Z87891 Personal history of nicotine dependence: Secondary | ICD-10-CM | POA: Insufficient documentation

## 2020-01-12 DIAGNOSIS — I825Y1 Chronic embolism and thrombosis of unspecified deep veins of right proximal lower extremity: Secondary | ICD-10-CM | POA: Diagnosis not present

## 2020-01-12 DIAGNOSIS — Z86718 Personal history of other venous thrombosis and embolism: Secondary | ICD-10-CM | POA: Diagnosis not present

## 2020-01-12 DIAGNOSIS — C099 Malignant neoplasm of tonsil, unspecified: Secondary | ICD-10-CM | POA: Diagnosis not present

## 2020-01-12 LAB — CMP (CANCER CENTER ONLY)
ALT: 9 U/L (ref 0–44)
AST: 11 U/L — ABNORMAL LOW (ref 15–41)
Albumin: 3.7 g/dL (ref 3.5–5.0)
Alkaline Phosphatase: 101 U/L (ref 38–126)
Anion gap: 10 (ref 5–15)
BUN: 8 mg/dL (ref 6–20)
CO2: 27 mmol/L (ref 22–32)
Calcium: 9.5 mg/dL (ref 8.9–10.3)
Chloride: 103 mmol/L (ref 98–111)
Creatinine: 0.86 mg/dL (ref 0.61–1.24)
GFR, Est AFR Am: >60 mL/min (ref >60)
GFR, Estimated: >60 mL/min (ref >60)
Glucose, Bld: 84 mg/dL (ref 70–99)
Potassium: 4.3 mmol/L (ref 3.5–5.1)
Sodium: 140 mmol/L (ref 135–145)
Total Bilirubin: 0.3 mg/dL (ref 0.3–1.2)
Total Protein: 6.9 g/dL (ref 6.5–8.1)

## 2020-01-12 LAB — CBC WITH DIFFERENTIAL (CANCER CENTER ONLY)
Abs Immature Granulocytes: 0.01 10*3/uL (ref 0.00–0.07)
Basophils Absolute: 0 10*3/uL (ref 0.0–0.1)
Basophils Relative: 1 %
Eosinophils Absolute: 0.2 10*3/uL (ref 0.0–0.5)
Eosinophils Relative: 4 %
HCT: 46.1 % (ref 39.0–52.0)
Hemoglobin: 15.1 g/dL (ref 13.0–17.0)
Immature Granulocytes: 0 %
Lymphocytes Relative: 18 %
Lymphs Abs: 0.9 10*3/uL (ref 0.7–4.0)
MCH: 30.4 pg (ref 26.0–34.0)
MCHC: 32.8 g/dL (ref 30.0–36.0)
MCV: 92.8 fL (ref 80.0–100.0)
Monocytes Absolute: 0.6 10*3/uL (ref 0.1–1.0)
Monocytes Relative: 11 %
Neutro Abs: 3.3 10*3/uL (ref 1.7–7.7)
Neutrophils Relative %: 66 %
Platelet Count: 212 10*3/uL (ref 150–400)
RBC: 4.97 MIL/uL (ref 4.22–5.81)
RDW: 16.9 % — ABNORMAL HIGH (ref 11.5–15.5)
WBC Count: 5 10*3/uL (ref 4.0–10.5)
nRBC: 0 % (ref 0.0–0.2)

## 2020-01-12 NOTE — Progress Notes (Signed)
[name] [name] ([id number]) Visit Report for 01/11/2020 Arrival Information Details Patient Name: Date of Service: [name], [name] 01/11/2020 3:00 PM Medical Record Number: [id number] Patient Account Number: [id number] Date of Birth/Sex: Treating RN: [birth year] (59 y.o. [name] [name], [name] [name] Primary Care [name] [name]: [name] [name] Other Clinician: [name] Referring [name] [name]: Treating [name] [name]: [name] [name] Weeks in Treatment: 2 Visit Information History Since Last Visit Added or deleted any medications: No Patient Arrived: Ambulatory Any new allergies or adverse reactions: No Arrival Time: 14:55 Had a fall or experienced change in No Accompanied By: self activities of daily living that may affect Transfer Assistance: None risk of falls: Patient Identification Verified: Yes Signs or symptoms of abuse/neglect since last visito No Secondary Verification Process Completed: Yes Hospitalized since last visit: No Patient Requires Transmission-Based Precautions: No Implantable device outside of the clinic excluding No Patient Has Alerts: No cellular tissue based products placed in the center since last visit: Pain Present Now: No Electronic Signature(s) Signed: 01/12/2020 5:12:49 PM By: [name] Entered By: [name] on 01/11/2020 15:45:36 -------------------------------------------------------------------------------- Encounter Discharge Information Details Patient Name: Date of Service: [name] Coe. 01/11/2020 3:00 PM Medical Record Number: [id number] Patient Account Number: [id number] Date of Birth/Sex: Treating RN: Feb 18, 1961 (59 y.o. [name] [name] Primary Care [name] [name]: [name] [name] Other Clinician: [name] Referring Jasiya Markie: Treating [name] [name]: [name] [name] Weeks in Treatment: 2 Encounter Discharge Information Items Discharge Condition: Stable Ambulatory Status:  Ambulatory Discharge Destination: Home Transportation: Private Auto Accompanied By: self Schedule Follow-up Appointment: Yes Clinical Summary of Care: Patient Declined Electronic Signature(s) Signed: 01/12/2020 5:12:49 PM By: [name] Entered By: [name] on 01/11/2020 17:24:44 -------------------------------------------------------------------------------- Patient/Caregiver Education Details Patient Name: Date of Service: [name] [name] 6/24/2021andnbsp3:00 PM Medical Record Number: [id number] Patient Account Number: [id number] Date of Birth/Gender: Treating RN: 1961/07/09 (59 y.o. [name] [name] Primary Care Physician: [name] [name] Other Clinician: [name] Referring Physician: Treating Physician/Extender: [name] [name] in Treatment: 2 Education Assessment Education Provided To: Patient Education Topics Provided Hyperbaric Oxygenation: Methods: Explain/Verbal Responses: State content correctly Electronic Signature(s) Signed: 01/12/2020 5:12:49 PM By: [name] Entered By: [name] on 01/11/2020 17:24:32 -------------------------------------------------------------------------------- Vitals Details Patient Name: Date of Service: [name] Coe. 01/11/2020 3:00 PM Medical Record Number: [id number] Patient Account Number: [id number] Date of Birth/Sex: Treating RN: March 14, 1961 (59 y.o. [name] [name], [name] [name] Primary Care [name] [name]: [name] [name] Other Clinician: [name] Referring [name] [name]: Treating [name] [name]: [name] [name] Weeks in Treatment: 2 Vital Signs Time Taken: 14:58 Temperature (F): 98.0 Height (in): 75 Pulse (bpm): 71 Weight (lbs): 190 Respiratory Rate (breaths/min): 18 Body Mass Index (BMI): 23.7 Blood Pressure (mmHg): 121/94 Reference Range: 80 - 120 mg / dl Electronic Signature(s) Signed: 01/12/2020 5:12:49 PM By: [name] Entered By: [name] on  01/11/2020 15:46:12

## 2020-01-12 NOTE — Progress Notes (Signed)
[name], [name] ([id number]) Visit Report for 01/10/2020 SuperBill Details Patient Name: Date of Service: [name], [name] 01/10/2020 Medical Record Number: [id number] Patient Account Number: [id number] Date of Birth/Sex: Treating RN: 03-Feb-1961 (59 y.o. 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

## 2020-01-12 NOTE — Progress Notes (Signed)
JERIS, ROSER ([id number]) Visit Report for 01/11/2020 HBO Details Patient Name: Date of Service: [name] [name], [name] [name] 01/11/2020 3:00 PM Medical Record Number: [id number] Patient Account Number: [id number] Date of Birth/Sex: Treating RN: [birth year] (59 y.o. 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 HBO Treatment Details Treatment Number: 7 Patient Type: Outpatient Chamber Type: Monoplace Chamber Serial #: [id number] Treatment Protocol: 2.5 ATA with 90 minutes oxygen, with two 5 minute air breaks Treatment Details Compression Rate Down: 2.0 psi / minute De-Compression Rate Up: 3.0 psi / minute A breaks and breathing ir Compress Tx Pressure periods Decompress Decompress Begins Reached (leave unused spaces Begins Ends blank) Chamber Pressure (ATA 1 2.5 2.5 2.5 2.5 2.5 - - 2.5 1 ) Clock Time (24 hr) 15:18 15:30 16:00 16:05 16:35 16:40 - - 17:10 17:16 Treatment Length: 118 (minutes) Treatment Segments: 4 Vital Signs Capillary Blood Glucose Reference Range: 80 - 120 mg / dl HBO Diabetic Blood Glucose Intervention Range: <131 mg/dl or >249 mg/dl Time Vitals Blood Respiratory Capillary Blood Glucose Pulse Action Type: Pulse: Temperature: Taken: Pressure: Rate: Glucose (mg/dl): Meter #: Oximetry (%) Taken: Pre 14:58 121/94 71 18 98 Post 17:23 120/94 46 18 97.7 Treatment Response Treatment Toleration: Well Treatment Completion Status: Treatment Completed without Adverse Event Electronic Signature(s) Signed: 01/12/2020 5:12:49 PM By: [name] Signed: 01/12/2020 5:27:17 PM By: Linton Ham MD Entered By: [name] on 01/11/2020 17:24:20 -------------------------------------------------------------------------------- HBO Safety Checklist Details Patient Name: Date of Service: [name] [name], [name] [name]. 01/11/2020 3:00 PM Medical Record Number: [id number] Patient Account Number: [id number] Date of Birth/Sex: Treating RN: [birth year] (59 y.o. [name], [name] Primary Care Lemoyne Scarpati: [name] Other Clinician: [name] Referring Ajdin Macke: Treating Apolonia Ellwood/Extender: [name] Weeks in Treatment: 2 HBO Safety Checklist Items Safety Checklist Consent Form Signed Patient voided / foley secured and emptied When did you last eato N/A Last dose of injectable or oral agent Ostomy pouch emptied and vented if applicable NA All implantable devices assessed, documented and approved NA Intravenous access site secured and place NA Valuables secured Linens and cotton and cotton/polyester blend (less than 51% polyester) Personal oil-based products / skin lotions / body lotions removed Wigs or hairpieces removed NA Smoking or tobacco materials removed NA Books / newspapers / magazines / loose paper removed Cologne, aftershave, perfume and deodorant removed Jewelry removed (may wrap wedding band) Make-up removed NA Hair care products removed NA Battery operated devices (external) removed NA Heating patches and chemical warmers removed NA Titanium eyewear removed NA Nail polish cured greater than 10 hours NA Casting material cured greater than 10 hours NA Hearing aids removed NA Loose dentures or partials removed NA Prosthetics have been removed NA Patient demonstrates correct use of air break device (if applicable) Patient concerns have been addressed Patient grounding bracelet on and cord attached to chamber Specifics for Inpatients (complete in addition to above) Medication sheet sent with patient Intravenous medications needed or due during therapy sent  with patient Drainage tubes (e.g. nasogastric tube or chest tube secured and vented) Endotracheal or Tracheotomy tube secured Cuff deflated of air and inflated with saline Airway suctioned Electronic Signature(s) Signed: 01/12/2020 5:12:49 PM By: [name] Entered By: [name]  on 01/11/2020 15:46:42

## 2020-01-12 NOTE — Progress Notes (Signed)
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 Left tonsil prominence with two Level II LN's (largest 2.2cm) and at least one Level IV LN (6mm);  no metastasisi ? Tonsil bx by Dr. Benjamine Mola, invasive SCCa, p16+; not a candidate for TORS -Late 10/2018 - 12/2018: definitive chemoradiation with weekly cisplatin  ? 03/2019: end-of-treatment PET showed decrease in FDG avidity in the left tonsil but some residual soft tissue fullness, resolution of left LN disease   -2021:  ? Diffuse pharyngeal soft tissue thickening at the tongue base on multiple CT's  ? 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 male with medical history significant for squamous cell carcinoma of left tonsil presents for a follow up visit.  The patient's last visit was on 10/18/2019 with Dr. Maylon Peppers. 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   On exam today [name] [name] notes that he continues to have some soreness on the left side of his throat.  He notes that he feels that when he swallows but it does not limit what he is able to eat.  He also reports that he is having trouble opening his mouth and is only able to do so several millimeters.  He reports he has lost approximately 15 pounds since December but has not had any issues with fevers, chills, sweats, nausea, vomiting or diarrhea.  He notes that the hyperbaric oxygen is going "okay" and he has not noticed much of a difference in his symptoms.  A full 10 point ROS is listed below.  MEDICAL HISTORY:  Past Medical History:  Diagnosis Date  . 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 Benign essential HTN 01/04/2012  . Cancer ([place])    tonsilar  . Chronic anticoagulation 01/02/2013  . Dermatofibroma of forearm 01/02/2013   Left side  . Hyperlipidemia, mixed 01/04/2012  . 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 Primary hypercoagulable state ([place]) 10/01/2014  . Sinus bradycardia, chronic 01/04/2012  . Superficial thrombosis of lower extremity 05/02/2012    SURGICAL HISTORY: Past Surgical History:  Procedure Laterality Date  . DIRECT LARYNGOSCOPY Left 10/19/2018   Procedure: DIRECT LARYNGOSCOPY;  Surgeon: Leta Baptist, MD;  Location: [place];  Service: ENT;  Laterality:  Left;  . IR IMAGING GUIDED PORT INSERTION  11/04/2018  . IR REMOVAL TUN ACCESS W/ PORT W/O FL MOD SED  05/01/2019  . TONSILLECTOMY Left 10/19/2018   Procedure: BIOPSY OF LEFT TONSIL;  Surgeon: Leta Baptist, MD;  Location: [place];  Service: ENT;   Laterality: Left;    SOCIAL HISTORY: Social History   Socioeconomic History  . Marital status: Divorced    Spouse name: Not on file  . Number of children: 2  . Years of education: Not on file  . Highest education level: Not on file  Occupational History  . Not on file  Tobacco Use  . Smoking status: Former Smoker    Packs/day: 0.50    Years: 30.00    Pack years: 15.00    Types: Cigarettes    Quit date: 10/19/2018    Years since quitting: 1.2  . Smokeless tobacco: Never Used  Vaping Use  . Vaping Use: Former  . Substances: Nicotine, Flavoring  . Devices: 1 cartridge q2-3 days, he tells me he is not using.   Substance and Sexual Activity  . Alcohol use: Yes    Alcohol/week: 13.0 - 14.0 standard drinks    Types: 12 Cans of beer, 1 - 2 Shots of liquor per week    Comment: 1-2 times per week.  . Drug use: No  . Sexual activity: Not on file  Other Topics Concern  . Not on file  Social History Narrative  . Not on file   Social Determinants of Health   Financial Resource Strain:   . Difficulty of Paying Living Expenses:   Food Insecurity:   . Worried About Charity fundraiser in the Last Year:   . Arboriculturist in the Last Year:   Transportation Needs:   . Film/video editor (Medical):   Marland Kitchen Lack of Transportation (Non-Medical):   Physical Activity:   . Days of Exercise per Week:   . Minutes of Exercise per Session:   Stress:   . Feeling of Stress :   Social Connections:   . Frequency of Communication with Friends and Family:   . Frequency of Social Gatherings with Friends and Family:   . Attends Religious Services:   . Active Member of Clubs or Organizations:   . Attends Archivist Meetings:   Marland Kitchen Marital Status:   Intimate Partner Violence:   . Fear of Current or Ex-Partner:   . Emotionally Abused:   Marland Kitchen Physically Abused:   . Sexually Abused:     FAMILY HISTORY: Family History  Problem Relation Age of Onset  . Stroke Father     ALLERGIES:   has No Known Allergies.  MEDICATIONS:  Current Outpatient Medications  Medication Sig Dispense Refill  . atorvastatin (LIPITOR) 20 MG tablet Take 1 tablet (20 mg total) by mouth daily. 30 tablet 11  . cyclobenzaprine (FLEXERIL) 10 MG tablet TAKE 1 TABLET BY MOUTH THREE TIMES A DAY AS NEEDED FOR MUSCLE SPASMS 30 tablet 0  . gabapentin (NEURONTIN) 100 MG capsule Take 200 mg by mouth every 8 (eight) hours as needed.    Marland Kitchen glycopyrrolate (ROBINUL) 1 MG tablet Take 1-2 tablet by mouth every eight hours as needed take 1-2 tabs every eight hours as needed for secretions    . ibuprofen (ADVIL) 200 MG tablet Take 600 mg by mouth every 6 (six) hours as needed for pain or fever.     . metoprolol succinate (TOPROL-XL) 25 MG 24 hr  tablet TAKE 1 TABLET BY MOUTH EVERY DAY 90 tablet 1  . ondansetron (ZOFRAN) 8 MG tablet TAKE ONE TABLET BY MOUTH TWICE A DAY AS NEEDED.  START ON THIRD DAY AFTER CHEMO (Patient not taking: Reported on 01/12/2020) 30 tablet 0  . oxyCODONE (OXY IR/ROXICODONE) 5 MG immediate release tablet Take 5 mg by mouth every 8 (eight) hours as needed.    . pentoxifylline (TRENTAL) 400 MG CR tablet Take 400mg  tab daily x 1 week, then 400mg  BID; take with food. Upset stomach is rare on this med, but stop if you have upset stomach. 60 tablet 5  . sodium fluoride (PREVIDENT 5000 PLUS) 1.1 % CREA dental cream Apply to tooth brush. Brush teeth for 2 minutes. Spit out excess. DO NOT rinse afterwards. Repeat nightly. 1 Tube prn  . vitamin E 180 MG (400 UNITS) capsule Take 400 IU daily x 1 week, then 400 IU BID 60 capsule 5  . warfarin (COUMADIN) 10 MG tablet Take 1 tablet of 10 mg warfarin daily except on Sundays, Thursdays and Saturdays, take only 1/2 tablet (5 mg). 72 tablet 2   No current facility-administered medications for this visit.    REVIEW OF SYSTEMS:   Constitutional: ( - ) fevers, ( - )  chills , ( - ) night sweats Eyes: ( - ) blurriness of vision, ( - ) double vision, ( - ) watery  eyes Ears, nose, mouth, throat, and face: ( - ) mucositis, ( - ) sore throat Respiratory: ( - ) cough, ( - ) dyspnea, ( - ) wheezes Cardiovascular: ( - ) palpitation, ( - ) chest discomfort, ( - ) lower extremity swelling Gastrointestinal:  ( - ) nausea, ( - ) heartburn, ( - ) change in bowel habits Skin: ( - ) abnormal skin rashes Lymphatics: ( - ) new lymphadenopathy, ( - ) easy bruising Neurological: ( - ) numbness, ( - ) tingling, ( - ) new weaknesses Behavioral/Psych: ( - ) mood change, ( - ) new changes  All other systems were reviewed with the patient and are negative.  PHYSICAL EXAMINATION: ECOG PERFORMANCE STATUS: 1 - Symptomatic but completely ambulatory  Vitals:   01/12/20 1429  BP: (!) 132/95  Pulse: (!) 105  Resp: 18  Temp: (!) 97.4 F (36.3 C)  SpO2: 99%   Filed Weights   01/12/20 1429  Weight: 195 lb 6.4 oz (88.6 kg)    GENERAL: well appearing middle aged Caucasian male alert, no distress and comfortable SKIN: skin color, texture, turgor are normal, no rashes or significant lesions EYES: conjunctiva are pink and non-injected, sclera clear OROPHARYNX: exam limited as patient cannot open his mouth wide NECK: supple, non-tender LYMPH:  no palpable lymphadenopathy in the cervical, axillary or supraclavicular lymph nodes.  LUNGS: clear to auscultation and percussion with normal breathing effort HEART: regular rate & rhythm and no murmurs and no lower extremity edema Musculoskeletal: no cyanosis of digits and no clubbing  PSYCH: alert & oriented x 3, fluent speech NEURO: no focal motor/sensory deficits  LABORATORY DATA:  I have reviewed the data as listed CBC Latest Ref Rng & Units 01/12/2020 10/18/2019 09/24/2019  WBC 4.0 - 10.5 K/uL 5.0 4.8 11.0(H)  Hemoglobin 13.0 - 17.0 g/dL 15.1 12.1(L) 11.9(L)  Hematocrit 39 - 52 % 46.1 38.7(L) 35.9(L)  Platelets 150 - 400 K/uL 212 266 312    CMP Latest Ref Rng & Units 01/12/2020 10/18/2019 09/24/2019  Glucose 70 - 99 mg/dL 84  97 136(H)  BUN  6 - 20 mg/dL 8 10 16   Creatinine 0.61 - 1.24 mg/dL 0.86 0.74 0.83  Sodium 135 - 145 mmol/L 140 140 132(L)  Potassium 3.5 - 5.1 mmol/L 4.3 4.1 3.8  Chloride 98 - 111 mmol/L 103 100 93(L)  CO2 22 - 32 mmol/L 27 30 30   Calcium 8.9 - 10.3 mg/dL 9.5 9.7 9.6  Total Protein 6.5 - 8.1 g/dL 6.9 6.9 -  Total Bilirubin 0.3 - 1.2 mg/dL 0.3 0.4 -  Alkaline Phos 38 - 126 U/L 101 94 -  AST 15 - 41 U/L 11(L) 11(L) -  ALT 0 - 44 U/L 9 7 -    RADIOGRAPHIC STUDIES: No results found.  ASSESSMENT & PLAN [name] [name] 59 y.o. male with medical history significant for squamous cell carcinoma of left tonsil presents for a follow up visit.  After review the labs, discussion with the patient, and review of the prior records the patient's findings are most consistent with squamous cell carcinoma of the left tonsil status post definitive treatment with chemoradiation.  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  The patient is currently undergoing hyperbaric oxygen therapy for this lesion.  Today I stressed the importance of continuing routine follow-up with ENT for direct visualization of his malignancy site.  We will continue to monitor and help with symptom management during this posttreatment surveillance period.   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, as evidenced by necrotic tissue on multiple biopsies and the absence of any residual malignancy -He has been referred to wound care center for hyperbaric oxygen treatment  -re-emphasized the importance of ENT surveillance, as well as shoulder and jaw exercises to improve ROM   #Recurrent LLE DVT - Currently on warfarin - Continue follow-up  with anticoagulation clinic  - Duration of anticoagulation is lifelong   #Persistent oral candidiasis -Despite aggressive antifungal therapy, including amphotericin, there was no significant improvement in oral candidiasis -Infectious disease felt that the oral candidiasis was noninfectious, and therefore does not warrant any further treatment at this time -I counseled the patient on the importance of maintaining good oral hygiene            No orders of the defined types were placed in this encounter.   All questions were answered. The patient knows to call the clinic with any problems, questions or concerns.  A total of more than 40 minutes were spent on this encounter and over half of that time was spent on counseling and coordination of care as outlined above.   Ledell Peoples, MD Department of Hematology/Oncology [place] at [facility] Phone: [phone] Pager: [phone] Email: [email or web address]  01/16/2020 6:37 PM

## 2020-01-12 NOTE — Progress Notes (Signed)
ELIZARDO, CHILSON ([id number]) Visit Report for 10/24/2019 Allergy List Details Patient Name: [name] [name], [name] [name] Date of Service: 10/24/2019 1:00 PM Medical Record Number: [id number] Patient Account Number: [id number] Date of Birth/Sex: 26-Jul-1960 (59 y.o. M) Treating RN: Cornell Barman Primary Care Rainn Bullinger: [name] Other Clinician: Referring Kelso Bibby: Eppie Gibson Treating Brenn Deziel/Extender: Melburn Hake, HOYT Weeks in Treatment: 0 Allergies Active Allergies No Known Drug Allergies Allergy Notes Electronic Signature(s) Signed: 10/24/2019 5:15:59 PM By: Gretta Cool, BSN, RN, CWS, Kim RN, BSN Entered By: Gretta Cool, BSN, RN, CWS, Kim on 10/24/2019 13:30:09 [name] [name] ([id number]) -------------------------------------------------------------------------------- Arrival Information Details Patient Name: [name] [name] Date of Service: 10/24/2019 1:00 PM Medical Record Number: [id number] Patient Account Number: [id number] Date of Birth/Sex: [birth year] (58 y.o. M) Treating RN: Montey Hora Primary Care Malakye Nolden: [name] Other Clinician: Referring Alnita Aybar: Eppie Gibson Treating Iola Turri/Extender: Sharalyn Ink in Treatment: 0 Visit Information Patient Arrived: Ambulatory Arrival Time: 13:16 Accompanied By: friend Transfer Assistance: None Patient Identification Verified: Yes Secondary Verification Process Completed: Yes Electronic Signature(s) Signed: 10/24/2019 4:19:04 PM By: Lorine Bears RCP, RRT, CHT Entered By: Lorine Bears on 10/24/2019 13:19:48 [name] [name] ([id number]) -------------------------------------------------------------------------------- Clinic Level of Care Assessment Details Patient Name: [name] [name] Date of Service: 10/24/2019 1:00 PM Medical Record Number: [id number] Patient Account Number: [id number] Date of Birth/Sex: 1960-09-06 (58 y.o. M) Treating RN: Montey Hora Primary Care Arantxa Piercey: [name] Other  Clinician: Referring Billijo Dilling: Eppie Gibson Treating Hansen Carino/Extender: Melburn Hake, HOYT Weeks in Treatment: 0 Clinic Level of Care Assessment Items TOOL 2 Quantity Score []  - Use when only an EandM is performed on the INITIAL visit 0 ASSESSMENTS - Nursing Assessment / Reassessment X - General Physical Exam (combine w/ comprehensive assessment (listed just below) when performed on new 1 20 pt. evals) X- 1 25 Comprehensive Assessment (HX, ROS, Risk Assessments, Wounds Hx, etc.) ASSESSMENTS - Wound and Skin Assessment / Reassessment []  - Simple Wound Assessment / Reassessment - one wound 0 []  - 0 Complex Wound Assessment / Reassessment - multiple wounds X- 1 10 Dermatologic / Skin Assessment (not related to wound area) ASSESSMENTS - Ostomy and/or Continence Assessment and Care []  - Incontinence Assessment and Management 0 []  - 0 Ostomy Care Assessment and Management (repouching, etc.) PROCESS - Coordination of Care X - Simple Patient / Family Education for ongoing care 1 15 []  - 0 Complex (extensive) Patient / Family Education for ongoing care X- 1 10 Staff obtains Programmer, systems, Records, Test Results / Process Orders []  - 0 Staff telephones HHA, Nursing Homes / Clarify orders / etc []  - 0 Routine Transfer to another Facility (non-emergent condition) []  - 0 Routine Hospital Admission (non-emergent condition) []  - 0 New Admissions / Biomedical engineer / Ordering NPWT, Apligraf, etc. []  - 0 Emergency Hospital Admission (emergent condition) X- 1 10 Simple Discharge Coordination []  - 0 Complex (extensive) Discharge Coordination PROCESS - Special Needs []  - Pediatric / Minor Patient Management 0 []  - 0 Isolation Patient Management []  - 0 Hearing / Language / Visual special needs []  - 0 Assessment of Community assistance (transportation, D/C planning, etc.) []  - 0 Additional assistance / Altered mentation []  - 0 Support Surface(s) Assessment (bed, cushion, seat,  etc.) INTERVENTIONS - Wound Cleansing / Measurement []  - Wound Imaging (photographs - any number of wounds) 0 []  - 0 Wound Tracing (instead of photographs) []  - 0 Simple Wound Measurement - one wound []  - 0 Complex Wound Measurement - multiple wounds DASEAN, BROW. ([id number]) []  -  0 Simple Wound Cleansing - one wound []  - 0 Complex Wound Cleansing - multiple wounds INTERVENTIONS - Wound Dressings []  - Small Wound Dressing one or multiple wounds 0 []  - 0 Medium Wound Dressing one or multiple wounds []  - 0 Large Wound Dressing one or multiple wounds []  - 0 Application of Medications - injection INTERVENTIONS - Miscellaneous []  - External ear exam 0 []  - 0 Specimen Collection (cultures, biopsies, blood, body fluids, etc.) []  - 0 Specimen(s) / Culture(s) sent or taken to Lab for analysis []  - 0 Patient Transfer (multiple staff / Civil Service fast streamer / Similar devices) []  - 0 Simple Staple / Suture removal (25 or less) []  - 0 Complex Staple / Suture removal (26 or more) []  - 0 Hypo / Hyperglycemic Management (close monitor of Blood Glucose) []  - 0 Ankle / Brachial Index (ABI) - do not check if billed separately Has the patient been seen at the hospital within the last three years: Yes Total Score: 90 Level Of Care: New/Established - Level 3 Electronic Signature(s) Signed: 10/24/2019 4:31:26 PM By: Montey Hora Entered By: Montey Hora on 10/24/2019 14:29:07 [name] [name] ([id number]) -------------------------------------------------------------------------------- Encounter Discharge Information Details Patient Name: [name] [name] Date of Service: 10/24/2019 1:00 PM Medical Record Number: [id number] Patient Account Number: [id number] Date of Birth/Sex: 08/09/1960 (58 y.o. M) Treating RN: Montey Hora Primary Care Hilary Milks: [name] Other Clinician: Referring Meghann Landing: Eppie Gibson Treating Marvena Tally/Extender: Sharalyn Ink in Treatment: 0 Encounter  Discharge Information Items Discharge Condition: Stable Ambulatory Status: Ambulatory Discharge Destination: Home Transportation: Private Auto Accompanied By: caregiver Schedule Follow-up Appointment: No Clinical Summary of Care: Electronic Signature(s) Signed: 10/24/2019 4:31:26 PM By: Montey Hora Entered By: Montey Hora on 10/24/2019 14:29:42 [name] [name] ([id number]) -------------------------------------------------------------------------------- Lower Extremity Assessment Details Patient Name: [name] [name] Date of Service: 10/24/2019 1:00 PM Medical Record Number: [id number] Patient Account Number: [id number] Date of Birth/Sex: 05-29-1961 (58 y.o. M) Treating RN: Cornell Barman Primary Care Jyssica Rief: [name] Other Clinician: Referring Joel Mericle: Eppie Gibson Treating Deyja Sochacki/Extender: Sharalyn Ink in Treatment: 0 Electronic Signature(s) Signed: 10/24/2019 5:15:59 PM By: Gretta Cool, BSN, RN, CWS, Kim RN, BSN Entered By: Gretta Cool, BSN, RN, CWS, Kim on 10/24/2019 13:30:01 MY, MADARIAGA ([id number]) -------------------------------------------------------------------------------- Multi Wound Chart Details Patient Name: [name] [name] Date of Service: 10/24/2019 1:00 PM Medical Record Number: [id number] Patient Account Number: [id number] Date of Birth/Sex: [birth year] (58 y.o. M) Treating RN: Montey Hora Primary Care Marcianne Ozbun: [name] Other Clinician: Referring Simren Popson: Eppie Gibson Treating Jaelon Gatley/Extender: Melburn Hake, HOYT Weeks in Treatment: 0 Vital Signs Height(in): 75 [name] [name](bpm): 112 Weight(lbs): 187 Blood Pressure(mmHg): 113/80 Body Mass Index(BMI): 23 Temperature(F): 97.8 Respiratory Rate(breaths/min): 16 Wound Assessments Treatment Notes Electronic Signature(s) Signed: 10/24/2019 4:31:26 PM By: Montey Hora Entered By: Montey Hora on 10/24/2019 14:10:44 [name] [name]  ([id number]) -------------------------------------------------------------------------------- Multi-Disciplinary Care Plan Details Patient Name: [name] [name] Date of Service: 10/24/2019 1:00 PM Medical Record Number: [id number] Patient Account Number: [id number] Date of Birth/Sex: [birth year] (58 y.o. M) Treating RN: Montey Hora Primary Care Janaia Kozel: [name] Other Clinician: Referring Feiga Nadel: Eppie Gibson Treating Quatisha Zylka/Extender: Sharalyn Ink in Treatment: 0 Active Inactive Electronic Signature(s) Signed: 11/28/2019 12:57:24 PM By: Gretta Cool, BSN, RN, CWS, Kim RN, BSN Signed: 01/12/2020 10:49:50 AM By: Montey Hora Previous Signature: 10/24/2019 4:31:26 PM Version By: Montey Hora Entered By: Gretta Cool BSN, RN, CWS, Kim on 11/28/2019 12:57:23 JENSYN, [place] ([id number]) -------------------------------------------------------------------------------- Pain Assessment Details Patient Name: [name] [name] Date of Service: 10/24/2019 1:00 PM Medical  Record Number: [id number] Patient Account Number: [id number] Date of Birth/Sex: November 15, 1960 (58 y.o. M) Treating RN: Montey Hora Primary Care Ameya Vowell: [name] Other Clinician: Referring Suzette Flagler: Eppie Gibson Treating Jyaire Koudelka/Extender: Melburn Hake, HOYT Weeks in Treatment: 0 Active Problems Location of Pain Severity and Description of Pain Patient Has Paino Yes Site Locations Pain Management and Medication Current Pain Management: Electronic Signature(s) Signed: 10/24/2019 4:19:04 PM By: Paulla Fore, RRT, CHT Signed: 10/24/2019 4:31:26 PM By: Montey Hora Entered By: Lorine Bears on 10/24/2019 13:20:05 [name] [name] ([id number]) -------------------------------------------------------------------------------- Patient/Caregiver Education Details Patient Name: [name] [name] Date of Service: 10/24/2019 1:00 PM Medical Record Number: [id number] Patient Account Number:  [id number] Date of Birth/Gender: 1961/04/28 (59 y.o. M) Treating RN: Montey Hora Primary Care Physician: [name] Other Clinician: Referring Physician: Eppie Gibson Treating Physician/Extender: Sharalyn Ink in Treatment: 0 Education Assessment Education Provided To: Patient and Caregiver Education Topics Provided Hyperbaric Oxygenation: Handouts: Hyperbaric Oxygen Methods: Explain/Verbal Responses: State content correctly Electronic Signature(s) Signed: 10/24/2019 4:31:26 PM By: Montey Hora Entered By: Montey Hora on 10/24/2019 14:29:25 [name] [name] ([id number]) -------------------------------------------------------------------------------- [place] Details Patient Name: [name] [name] Date of Service: 10/24/2019 1:00 PM Medical Record Number: [id number] Patient Account Number: [id number] Date of Birth/Sex: Jan 02, 1961 (59 y.o. M) Treating RN: Montey Hora Primary Care Syesha Thaw: [name] Other Clinician: Referring Hazem Kenner: Eppie Gibson Treating Antrell Tipler/Extender: Melburn Hake, HOYT Weeks in Treatment: 0 Vital Signs Time Taken: 13:15 Temperature (F): 97.8 Height (in): 75 [name] [name] (bpm): 112 Source: Stated Respiratory Rate (breaths/min): 16 Weight (lbs): 187 Blood Pressure (mmHg): 113/80 Source: Measured Reference Range: 80 - 120 mg / dl Body Mass Index (BMI): 23.4 Electronic Signature(s) Signed: 10/24/2019 4:19:04 PM By: Lorine Bears RCP, RRT, CHT Entered By: Lorine Bears on 10/24/2019 13:20:59

## 2020-01-12 NOTE — Progress Notes (Signed)
[name] [name], [name] [name] ([id number]) Visit Report for 01/11/2020 SuperBill Details Patient Name: Date of Service: [name] [name], [name] [name] 01/11/2020 Medical Record Number: [id number] Patient Account Number: [id number] Date of Birth/Sex: Treating RN: 09-14-1960 (59 y.o. 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, unspecified Facility Procedures CPT4 Code Description Modifier Quantity [id number]-Physician attendance and supervision of hyperbaric oxygen therapy, per session 1 ICD-10 Diagnosis Description L59.8 Other specified disorders of the skin and subcutaneous tissue related to radiation Physician Procedures Quantity CPT4 Code Description Modifier [id number] - WC PHYS HYPERBARIC OXYGEN THERAPY 1 ICD-10 Diagnosis Description L59.8 Other specified disorders of the skin and subcutaneous tissue related to radiation Electronic Signature(s) Signed: 01/12/2020 5:12:49 PM By: [name] Signed: 01/12/2020 5:27:17 PM By: Linton Ham MD Entered By: [name] on 01/11/2020 15:49:37

## 2020-01-12 NOTE — Progress Notes (Signed)
AMARO, MANGOLD ([id number]) Visit Report for 01/10/2020 HBO Details Patient Name: Date of Service: [name] [name], [name] [name] 01/10/2020 3:00 PM Medical Record Number: [id number] Patient Account Number: [id number] Date of Birth/Sex: Treating RN: [birth year] (59 y.o. 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 HBO Treatment Details Treatment Number: 6 Patient Type: Outpatient Chamber Type: Monoplace Chamber Serial #: [id number] Treatment Protocol: 2.5 ATA with 90 minutes oxygen, with two 5 minute air breaks Treatment Details Compression Rate Down: 2.0 psi / minute De-Compression Rate Up: 3.0 psi / minute A breaks and breathing ir Compress Tx Pressure periods Decompress Decompress Begins Reached (leave unused spaces Begins Ends blank) Chamber Pressure (ATA 1 2.5 2.5 2.5 2.5 2.5 - - 2.5 1 ) Clock Time (24 hr) 15:14 15:26 15:56 16:01 16:31 16:36 - - 17:06 17:12 Treatment Length: 118 (minutes) Treatment Segments: 4 Vital Signs Capillary Blood Glucose Reference Range: 80 - 120 mg / dl HBO Diabetic Blood Glucose Intervention Range: <131 mg/dl or >249 mg/dl Time Vitals Blood Respiratory Capillary Blood Glucose Pulse Action Type: Pulse: Temperature: Taken: Pressure: Rate: Glucose (mg/dl): Meter #: Oximetry (%) Taken: Pre 15:05 121/89 71 16 97.7 Post 17:15 121/78 79 18 97.9 Treatment Response Treatment Toleration: Well Treatment Completion Status: Treatment Completed without Adverse Event Electronic Signature(s) Signed: 01/10/2020 5:20:20 PM By: [name] Signed: 01/12/2020 3:47:20 PM By: [name] PA-C Entered By:  [name] on 01/10/2020 17:18:16 -------------------------------------------------------------------------------- HBO Safety Checklist Details Patient Name: Date of Service: [name] [name]. 01/10/2020 3:00 PM Medical Record Number: [id number] Patient Account Number: [id number] Date of Birth/Sex: Treating RN: April 05, 1961 (59 y.o. [name] Primary Care Martino Tompson: [name] Other Clinician: [name] Referring Draycen Leichter: Treating Franklin Baumbach/Extender: [name] Weeks in Treatment: 2 HBO Safety Checklist Items Safety Checklist Consent Form Signed Patient voided / foley secured and emptied When did you last eato N/A Last dose of injectable or oral agent Ostomy pouch emptied and vented if applicable NA All implantable devices assessed, documented and approved NA Intravenous access site secured and place NA Valuables secured Linens and cotton and cotton/polyester blend (less than 51% polyester) Personal oil-based products / skin lotions / body lotions removed Wigs or hairpieces removed Smoking or tobacco materials removed NA Books / newspapers / magazines / loose paper removed Cologne, aftershave, perfume and deodorant removed Jewelry removed (may wrap wedding band) Make-up removed NA Hair care products removed Battery operated devices (external) removed NA Heating patches and chemical warmers removed NA Titanium eyewear removed NA Nail polish cured greater than 10 hours NA Casting material cured greater than 10 hours NA Hearing aids removed NA Loose dentures or partials removed NA Prosthetics have been removed NA Patient demonstrates correct use of air break device (if applicable) Patient concerns have been addressed Patient grounding bracelet on and cord attached to chamber Specifics for Inpatients (complete in addition to above) Medication sheet sent with patient Intravenous medications needed or due during therapy  sent with patient Drainage tubes (e.g. nasogastric tube or chest tube secured and vented) Endotracheal or Tracheotomy tube secured Cuff deflated of air and inflated with saline Airway suctioned Electronic Signature(s) Signed: 01/10/2020 5:20:20 PM By: [name] Entered By: Coralyn Helling,  Kayla on 01/10/2020 15:35:00

## 2020-01-15 ENCOUNTER — Encounter: Payer: Medicaid Other | Admitting: Physical Therapy

## 2020-01-15 ENCOUNTER — Telehealth: Payer: Self-pay | Admitting: Hematology and Oncology

## 2020-01-15 ENCOUNTER — Encounter (HOSPITAL_BASED_OUTPATIENT_CLINIC_OR_DEPARTMENT_OTHER): Payer: Medicaid Other | Admitting: Internal Medicine

## 2020-01-15 DIAGNOSIS — C099 Malignant neoplasm of tonsil, unspecified: Secondary | ICD-10-CM | POA: Diagnosis not present

## 2020-01-15 DIAGNOSIS — M272 Inflammatory conditions of jaws: Secondary | ICD-10-CM | POA: Diagnosis not present

## 2020-01-15 DIAGNOSIS — L598 Other specified disorders of the skin and subcutaneous tissue related to radiation: Secondary | ICD-10-CM | POA: Diagnosis not present

## 2020-01-15 LAB — TSH: TSH: 3.259 u[IU]/mL (ref 0.320–4.118)

## 2020-01-15 NOTE — Progress Notes (Signed)
EZREAL, TURAY ([id number]) Visit Report for 01/15/2020 Arrival Information Details Patient Name: Date of Service: YUTO, CAJUSTE 01/15/2020 3:00 PM Medical Record Number: [id number] Patient Account Number: [id number] Date of Birth/Sex: Treating RN: April 01, 1961 (59 y.o. [name] Primary Care Kalden Wanke: [name] Other Clinician: [name] Referring Josep Luviano: Treating Nohealani Medinger/Extender: [name] Weeks in Treatment: 3 Visit Information History Since Last Visit Added or deleted any medications: No Patient Arrived: Ambulatory Any new allergies or adverse reactions: No Arrival Time: 12:45 Had a fall or experienced change in No Accompanied By: self activities of daily living that may affect Transfer Assistance: None risk of falls: Patient Identification Verified: Yes Signs or symptoms of abuse/neglect since last visito No Secondary Verification Process Completed: Yes Hospitalized since last visit: No Patient Requires Transmission-Based Precautions: No Implantable device outside of the clinic excluding No Patient Has Alerts: No cellular tissue based products placed in the center since last visit: Pain Present Now: No Electronic Signature(s) Signed: 01/15/2020 5:28:49 PM By: [name] EMT/HBOT/SD Entered By: [name] on 01/15/2020 16:47:21 -------------------------------------------------------------------------------- Encounter Discharge Information Details Patient Name: Date of Service: [name] 01/15/2020 3:00 PM Medical Record Number: [id number] Patient Account Number: [id number] Date of Birth/Sex: Treating RN: [birth year] (59 y.o. [name] Primary Care Selden Noteboom: [name] Other Clinician: [name] Referring Krystn Dermody: Treating Earlin Sweeden/Extender: [name] Weeks in Treatment: 3 Encounter Discharge Information Items Discharge Condition: Stable Ambulatory Status:  Ambulatory Discharge Destination: Home Transportation: Private Auto Accompanied By: self Schedule Follow-up Appointment: Yes Clinical Summary of Care: Patient Declined Electronic Signature(s) Signed: 01/15/2020 5:28:49 PM By: [name] EMT/HBOT/SD Entered By: [name] on 01/15/2020 17:28:35 -------------------------------------------------------------------------------- Patient/Caregiver Education Details Patient Name: Date of Service: SIYON, LINCK 6/28/2021andnbsp3:00 PM Medical Record Number: [id number] Patient Account Number: [id number] Date of Birth/Gender: Treating RN: March 16, 1961 (59 y.o. [name] Primary Care Physician: [name] Other Clinician: [name] Referring Physician: Treating Physician/Extender: Renard Hamper in Treatment: 3 Education Assessment Education Provided To: Patient Education Topics Provided Hyperbaric Oxygenation: Methods: Explain/Verbal Responses: State content correctly Electronic Signature(s) Signed: 01/15/2020 5:28:49 PM By: [name] EMT/HBOT/SD Entered By: [name] on 01/15/2020 17:28:21 -------------------------------------------------------------------------------- Vitals Details Patient Name: Date of Service: [name] 01/15/2020 3:00 PM Medical Record Number: [id number] Patient Account Number: [id number] Date of Birth/Sex: Treating RN: 02/20/1961 (59 y.o. [name] Primary Care Revere Maahs: [name] Other Clinician: [name] Referring Rikki Trosper: Treating Jaleel Allen/Extender: [name] Weeks in Treatment: 3 Vital Signs Time Taken: 12:50 Temperature (F): 98.4 Height (in): 75 Pulse (bpm): 70 Weight (lbs): 190 Respiratory Rate (breaths/min): 15 Body Mass Index (BMI): 23.7 Blood Pressure (mmHg): 149/89 Reference Range: 80 - 120 mg / dl Electronic Signature(s) Signed: 01/15/2020 5:28:49 PM By: [name]  EMT/HBOT/SD Entered By: [name] on 01/15/2020 16:47:39

## 2020-01-15 NOTE — Progress Notes (Signed)
LUNDON, VERDEJO ([id number]) Visit Report for 01/15/2020 SuperBill Details Patient Name: Date of Service: BURLON, CENTRELLA 01/15/2020 Medical Record Number: [id number] Patient Account Number: [id number] Date of Birth/Sex: Treating RN: 1961/05/19 (59 y.o. 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, unspecified Facility Procedures CPT4 Code Description Modifier Quantity [id number] 99183-Physician attendance and supervision of hyperbaric oxygen therapy, per session 1 ICD-10 Diagnosis Description L59.8 Other specified disorders of the skin and subcutaneous tissue related to radiation Physician Procedures Quantity CPT4 Code Description Modifier [id number] 35701 - WC PHYS HYPERBARIC OXYGEN THERAPY 1 ICD-10 Diagnosis Description L59.8 Other specified disorders of the skin and subcutaneous tissue related to radiation Electronic Signature(s) Signed: 01/15/2020 5:28:49 PM By: [name] EMT/HBOT/SD Signed: 01/15/2020 5:46:39 PM By: Linton Ham MD Entered By: [name] on 01/15/2020 17:28:10

## 2020-01-15 NOTE — Progress Notes (Addendum)
[name] [name], [name] [name] ([id number]) Visit Report for 01/15/2020 HBO Details Patient Name: Date of Service: [name] [name], [name] [name] 01/15/2020 3:00 PM Medical Record Number: [id number] Patient Account Number: [id number] Date of Birth/Sex: Treating RN: 1960-12-10 (59 y.o. 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 HBO Treatment Details Treatment Number: 8 Patient Type: Outpatient Chamber Type: Monoplace Chamber Serial #: [id number] Treatment Protocol: 2.5 ATA with 90 minutes oxygen, with two 5 minute air breaks Treatment Details Compression Rate Down: 2.0 psi / minute De-Compression Rate Up: 3.0 psi / minute A breaks and breathing ir Compress Tx Pressure periods Decompress Decompress Begins Reached (leave unused spaces Begins Ends blank) Chamber Pressure (ATA 1 2.5 2.5 2.5 2.5 2.5 - - 2.5 1 ) Clock Time (24 hr) 15:13 15:25 15:55 16:00 16:30 16:35 - - 17:05 17:11 Treatment Length: 118 (minutes) Treatment Segments: 4 Vital Signs Capillary Blood Glucose Reference Range: 80 - 120 mg / dl HBO Diabetic Blood Glucose Intervention Range: <131 mg/dl or >249 mg/dl Time Vitals Blood Respiratory Capillary Blood Glucose Pulse Action Type: Pulse: Temperature: Taken: Pressure: Rate: Glucose (mg/dl): Meter #: Oximetry (%) Taken: Pre 12:50 149/89 70 15 98.4 Post 17:15 114/97 89 15 98.2 Treatment Response Treatment Toleration: Well Treatment Completion Status: Treatment Completed without Adverse Event Ia Leeb Notes No concerns with treatment given Physician HBO Attestation: I certify that I supervised this HBO treatment in accordance with  Medicare guidelines. A trained emergency response team is readily available per Yes hospital policies and procedures. Continue HBOT as ordered. Yes Electronic Signature(s) Signed: 01/15/2020 5:46:39 PM By: Linton Ham MD Previous Signature: 01/15/2020 5:28:49 PM Version By: [name] EMT/HBOT/SD Entered By: Linton Ham on 01/15/2020 17:45:03 -------------------------------------------------------------------------------- HBO Safety Checklist Details Patient Name: Date of Service: [name] [name], [name] [name] 01/15/2020 3:00 PM Medical Record Number: [id number] Patient Account Number: [id number] Date of Birth/Sex: Treating RN: [birth year] (59 y.o. [name] Primary Care Elvera Almario: [name] Other Clinician: [name] Referring Elaynah Virginia: Treating Claire Dolores/Extender: [name] Weeks in Treatment: 3 HBO Safety Checklist Items Safety Checklist Consent Form Signed Patient voided / foley secured and emptied When did you last eato n/a Last dose of injectable or oral agent n/a Ostomy pouch emptied and vented if applicable NA All implantable devices assessed, documented and approved NA Intravenous access site secured and place NA Valuables secured Linens and cotton and cotton/polyester blend (less than 51% polyester) Personal oil-based products / skin lotions / body lotions removed Wigs or hairpieces removed NA Smoking or tobacco materials removed NA Books / newspapers / magazines / loose paper removed Cologne, aftershave, perfume and deodorant removed Jewelry removed (may wrap wedding band) Make-up removed NA Hair care products removed Battery operated devices (external) removed NA Heating patches and chemical warmers removed NA Titanium eyewear removed NA Nail polish cured greater than 10 hours NA Casting material cured greater than 10 hours NA Hearing aids removed NA Loose dentures or partials removed NA Prosthetics have  been removed NA Patient demonstrates correct use of air break device (if applicable) Patient concerns have been addressed Patient grounding bracelet on and cord attached to chamber Specifics for Inpatients (complete in addition to above) Medication sheet sent with patient Intravenous medications needed or  due during therapy sent with patient Drainage tubes (e.g. nasogastric tube or chest tube secured and vented) Endotracheal or Tracheotomy tube secured Cuff deflated of air and inflated with saline Airway suctioned Electronic Signature(s) Signed: 01/15/2020 4:48:18 PM By: [name] EMT/HBOT/SD Entered By: [name] on 01/15/2020 16:48:18

## 2020-01-15 NOTE — Telephone Encounter (Signed)
Scheduled per 6/25 los. Pt is aware of appt time and date.

## 2020-01-16 ENCOUNTER — Encounter: Payer: Self-pay | Admitting: Hematology and Oncology

## 2020-01-16 ENCOUNTER — Encounter (HOSPITAL_BASED_OUTPATIENT_CLINIC_OR_DEPARTMENT_OTHER): Payer: Medicaid Other | Admitting: Internal Medicine

## 2020-01-16 DIAGNOSIS — M272 Inflammatory conditions of jaws: Secondary | ICD-10-CM | POA: Diagnosis not present

## 2020-01-16 DIAGNOSIS — C099 Malignant neoplasm of tonsil, unspecified: Secondary | ICD-10-CM | POA: Diagnosis not present

## 2020-01-16 DIAGNOSIS — L598 Other specified disorders of the skin and subcutaneous tissue related to radiation: Secondary | ICD-10-CM | POA: Diagnosis not present

## 2020-01-16 MED ORDER — CYCLOBENZAPRINE HCL 10 MG PO TABS: ORAL_TABLET | ORAL | 0 refills | Status: DC

## 2020-01-17 ENCOUNTER — Encounter (HOSPITAL_BASED_OUTPATIENT_CLINIC_OR_DEPARTMENT_OTHER): Payer: Medicaid Other | Admitting: Physician Assistant

## 2020-01-17 ENCOUNTER — Encounter: Payer: Medicaid Other | Admitting: Physical Therapy

## 2020-01-17 DIAGNOSIS — M272 Inflammatory conditions of jaws: Secondary | ICD-10-CM | POA: Diagnosis not present

## 2020-01-17 DIAGNOSIS — L598 Other specified disorders of the skin and subcutaneous tissue related to radiation: Secondary | ICD-10-CM | POA: Diagnosis not present

## 2020-01-17 DIAGNOSIS — C099 Malignant neoplasm of tonsil, unspecified: Secondary | ICD-10-CM | POA: Diagnosis not present

## 2020-01-17 NOTE — Progress Notes (Signed)
MADEX, SEALS ([id number]) Visit Report for 01/17/2020 Arrival Information Details Patient Name: Date of Service: [name] [name], [name] [name] 01/17/2020 3:00 PM Medical Record Number: [id number] Patient Account Number: [id number] Date of Birth/Sex: Treating RN: 09/05/1960 (59 y.o. [name] Primary Care Omolara Carol: [name] Other Clinician: [name] Referring Emeka Lindner: Treating Dareth Andrew/Extender: [name] Weeks in Treatment: 3 Visit Information History Since Last Visit Added or deleted any medications: No Patient Arrived: Ambulatory Any new allergies or adverse reactions: No Arrival Time: 14:40 Had a fall or experienced change in No Accompanied By: self activities of daily living that may affect Transfer Assistance: None risk of falls: Patient Identification Verified: Yes Signs or symptoms of abuse/neglect since last visito No Secondary Verification Process Completed: Yes Hospitalized since last visit: No Patient Requires Transmission-Based Precautions: No Implantable device outside of the clinic excluding No Patient Has Alerts: No cellular tissue based products placed in the center since last visit: Pain Present Now: No Electronic Signature(s) Signed: 01/17/2020 5:27:00 PM By: [name] EMT/HBOT/SD Entered By: [name] on 01/17/2020 16:49:53 -------------------------------------------------------------------------------- Encounter Discharge Information Details Patient Name: Date of Service: [name] Coe. 01/17/2020 3:00 PM Medical Record Number: [id number] Patient Account Number: [id number] Date of Birth/Sex: Treating RN: July 05, 1961 (59 y.o. [name] Primary Care Fadumo Heng: [name] Other Clinician: [name] Referring Keiva Dina: Treating Maymuna Detzel/Extender: [name] Weeks in Treatment: 3 Encounter Discharge Information Items Discharge Condition: Stable Ambulatory Status:  Ambulatory Discharge Destination: Home Transportation: Private Auto Accompanied By: self Schedule Follow-up Appointment: Yes Clinical Summary of Care: Patient Declined Electronic Signature(s) Signed: 01/17/2020 5:27:00 PM By: [name] EMT/HBOT/SD Entered By: [name] on 01/17/2020 17:25:06 -------------------------------------------------------------------------------- Patient/Caregiver Education Details Patient Name: Date of Service: [name] [name], [name] [name] 6/30/2021andnbsp3:00 PM Medical Record Number: [id number] Patient Account Number: [id number] Date of Birth/Gender: Treating RN: [birth year] (59 y.o. [name] Primary Care Physician: [name] Other Clinician: [name] Referring Physician: Treating Physician/Extender: [name] Weeks in Treatment: 3 Education Assessment Education Provided To: Patient Education Topics Provided Hyperbaric Oxygenation: Methods: Explain/Verbal Responses: State content correctly Electronic Signature(s) Signed: 01/17/2020 5:27:00 PM By: [name] EMT/HBOT/SD Entered By: [name] on 01/17/2020 17:24:55 -------------------------------------------------------------------------------- Vitals Details Patient Name: Date of Service: [name] Coe. 01/17/2020 3:00 PM Medical Record Number: [id number] Patient Account Number: [id number] Date of Birth/Sex: Treating RN: [birth year] (59 y.o. [name] Primary Care Yacine Garriga: [name] Other Clinician: [name] Referring Haadi Santellan: Treating Koleman Marling/Extender: Eloy End, RYLEE Weeks in Treatment: 3 Vital Signs Time Taken: 14:45 Temperature (F): 98.2 Height (in): 75 Pulse (bpm): 57 Weight (lbs): 190 Respiratory Rate (breaths/min): 16 Body Mass Index (BMI): 23.7 Blood Pressure (mmHg): 112/77 Reference Range: 80 - 120 mg / dl Electronic Signature(s) Signed: 01/17/2020 5:27:00 PM By: [name] EMT/HBOT/SD Entered By: [name] on 01/17/2020 16:50:10

## 2020-01-17 NOTE — Progress Notes (Signed)
HAWK, MONES ([id number]) Visit Report for 01/17/2020 SuperBill Details Patient Name: Date of Service: [name] [name], [name] [name] 01/17/2020 Medical Record Number: [id number] Patient Account Number: [id number] Date of Birth/Sex: Treating RN: 04/06/1961 (59 y.o. 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, unspecified Facility Procedures CPT4 Code Description Modifier Quantity [id number] 99183-Physician attendance and supervision of hyperbaric oxygen therapy, per session 1 ICD-10 Diagnosis Description L59.8 Other specified disorders of the skin and subcutaneous tissue related to radiation Physician Procedures Quantity CPT4 Code Description Modifier [id number] 89211 - WC PHYS HYPERBARIC OXYGEN THERAPY 1 ICD-10 Diagnosis Description L59.8 Other specified disorders of the skin and subcutaneous tissue related to radiation Electronic Signature(s) Signed: 01/17/2020 5:27:00 PM By: [name] EMT/HBOT/SD Signed: 01/17/2020 5:59:30 PM By: [name] PA-C Entered By: [name] on 01/17/2020 17:24:41

## 2020-01-17 NOTE — Progress Notes (Signed)
[name] [name], [name] [name] ([id number]) Visit Report for 01/16/2020 SuperBill Details Patient Name: Date of Service: [name] [name], [name] [name] 01/16/2020 Medical Record Number: [id number] Patient Account Number: [id number] Date of Birth/Sex: Treating RN: 03/31/1961 (59 y.o. 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, unspecified Facility Procedures CPT4 Code Description Modifier Quantity [id number] 99183-Physician attendance and supervision of hyperbaric oxygen therapy, per session 1 ICD-10 Diagnosis Description L59.8 Other specified disorders of the skin and subcutaneous tissue related to radiation Physician Procedures Quantity CPT4 Code Description Modifier [id number] 62229 - WC PHYS HYPERBARIC OXYGEN THERAPY 1 ICD-10 Diagnosis Description L59.8 Other specified disorders of the skin and subcutaneous tissue related to radiation Electronic Signature(s) Signed: 01/16/2020 5:46:32 PM By: [name] EMT/HBOT/SD Signed: 01/17/2020 3:41:00 PM By: Linton Ham MD Entered By: [name] on 01/16/2020 17:44:02

## 2020-01-17 NOTE — Progress Notes (Signed)
[name] [name], [name] [name] ([id number]) Visit Report for 01/16/2020 HBO Details Patient Name: Date of Service: [name] [name], [name] [name] 01/16/2020 3:00 PM Medical Record Number: [id number] Patient Account Number: [id number] Date of Birth/Sex: Treating RN: Jul 09, 1961 (59 y.o. 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 HBO Treatment Details Treatment Number: 9 Patient Type: Outpatient Chamber Type: Monoplace Chamber Serial #: [id number] Treatment Protocol: 2.5 ATA with 90 minutes oxygen, with two 5 minute air breaks Treatment Details Compression Rate Down: 2.0 psi / minute De-Compression Rate Up: 2.0 psi / minute A breaks and breathing ir Compress Tx Pressure periods Decompress Decompress Begins Reached (leave unused spaces Begins Ends blank) Chamber Pressure (ATA 1 2.5 2.5 2.5 2.5 2.5 - - 2.5 1 ) Clock Time (24 hr) 15:04 15:16 15:46 15:51 16:21 16:26 - - 16:56 17:02 Treatment Length: 118 (minutes) Treatment Segments: 4 Vital Signs Capillary Blood Glucose Reference Range: 80 - 120 mg / dl HBO Diabetic Blood Glucose Intervention Range: <131 mg/dl or >249 mg/dl Time Vitals Blood Respiratory Capillary Blood Glucose Pulse Action Type: Pulse: Temperature: Taken: Pressure: Rate: Glucose (mg/dl): Meter #: Oximetry (%) Taken: Pre 14:59 105/81 56 14 97.6 Post 17:05 138/85 83 16 98 Treatment Response Treatment Toleration: Well Treatment Completion Status: Treatment Completed without Adverse Event Joniqua Sidle Notes No concerns with treatment given Physician HBO Attestation: I certify that I supervised this HBO treatment in accordance with  Medicare guidelines. A trained emergency response team is readily available per Yes hospital policies and procedures. Continue HBOT as ordered. Yes Electronic Signature(s) Signed: 01/17/2020 3:41:00 PM By: Linton Ham MD Previous Signature: 01/16/2020 5:46:32 PM Version By: [name] EMT/HBOT/SD Entered By: Linton Ham on 01/17/2020 15:30:08 -------------------------------------------------------------------------------- HBO Safety Checklist Details Patient Name: Date of Service: [name] Coe. 01/16/2020 3:00 PM Medical Record Number: [id number] Patient Account Number: [id number] Date of Birth/Sex: Treating RN: [birth year] (59 y.o. [name] [name]) [name] Primary Care Dorraine Ellender: [name] Other Clinician: [name] Referring Casha Estupinan: Treating Lajuana Patchell/Extender: [name] Weeks in Treatment: 3 HBO Safety Checklist Items Safety Checklist Consent Form Signed Patient voided / foley secured and emptied When did you last eato N/A Last dose of injectable or oral agent n/a Ostomy pouch emptied and vented if applicable NA All implantable devices assessed, documented and approved NA Intravenous access site secured and place NA Valuables secured Linens and cotton and cotton/polyester blend (less than 51% polyester) Personal oil-based products / skin lotions / body lotions removed Wigs or hairpieces removed NA Smoking or tobacco materials removed NA Books / newspapers / magazines / loose paper removed Cologne, aftershave, perfume and deodorant removed Jewelry removed (may wrap wedding band) Make-up removed NA Hair care products removed NA Battery operated devices (external) removed NA Heating patches and chemical warmers removed NA Titanium eyewear removed NA Nail polish cured greater than 10 hours NA Casting material cured greater than 10 hours NA Hearing aids removed NA Loose dentures or partials removed NA Prosthetics have  been removed NA Patient demonstrates correct use of air break device (if applicable) Patient concerns have been addressed Patient grounding bracelet on and cord attached to chamber Specifics for Inpatients (complete in addition to above) Medication sheet sent with patient Intravenous medications needed  or due during therapy sent with patient Drainage tubes (e.g. nasogastric tube or chest tube secured and vented) Endotracheal or Tracheotomy tube secured Cuff deflated of air and inflated with saline Airway suctioned Electronic Signature(s) Signed: 01/16/2020 5:46:32 PM By: [name] EMT/HBOT/SD Entered By: [name] on 01/16/2020 17:42:23

## 2020-01-17 NOTE — Progress Notes (Addendum)
[name], [name] ([id number]) Visit Report for 01/17/2020 HBO Details Patient Name: Date of Service: [name] [name], [name] [name] 01/17/2020 3:00 PM Medical Record Number: [id number] Patient Account Number: [id number] Date of Birth/Sex: Treating RN: [birth year] (59 y.o. 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 HBO Treatment Details Treatment Number: 10 Patient Type: Outpatient Chamber Type: Monoplace Chamber Serial #: [id number] Treatment Protocol: 2.5 ATA with 90 minutes oxygen, with two 5 minute air breaks Treatment Details Compression Rate Down: 2.0 psi / minute De-Compression Rate Up: 3.0 psi / minute A breaks and breathing ir Compress Tx Pressure periods Decompress Decompress Begins Reached (leave unused spaces Begins Ends blank) Chamber Pressure (ATA 1 2.5 2.5 2.5 2.5 2.5 - - 2.5 1 ) Clock Time (24 hr) 14:53 15:05 15:35 15:40 16:10 16:15 - - 16:45 16:51 Treatment Length: 118 (minutes) Treatment Segments: 4 Vital Signs Capillary Blood Glucose Reference Range: 80 - 120 mg / dl HBO Diabetic Blood Glucose Intervention Range: <131 mg/dl or >249 mg/dl Time Vitals Blood Respiratory Capillary Blood Glucose Pulse Action Type: Pulse: Temperature: Taken: Pressure: Rate: Glucose (mg/dl): Meter #: Oximetry (%) Taken: Pre 14:45 112/77 57 16 98.2 Post 16:53 135/99 82 15 97.9 Treatment Response Treatment Toleration: Well Treatment Completion Status: Treatment Completed without Adverse Event Electronic Signature(s) Signed: 01/17/2020 5:27:00 PM By: [name] [name] EMT/HBOT/SD Signed: 01/17/2020 5:59:30 PM By: [name]  PA-C Entered By: [name] [name] on 01/17/2020 17:24:27 -------------------------------------------------------------------------------- HBO Safety Checklist Details Patient Name: Date of Service: [name] [name], [name] [name] 01/17/2020 3:00 PM Medical Record Number: [id number] Patient Account Number: [id number] Date of Birth/Sex: Treating RN: Jun 12, 1961 (59 y.o. [name] [name] Primary Care [name] [name]: [name] [name] Other Clinician: [name] Referring [name] [name]: Treating [name] [name]/Extender: [name] [name] Weeks in Treatment: 3 HBO Safety Checklist Items Safety Checklist Consent Form Signed Patient voided / foley secured and emptied When did you last eato n/a Last dose of injectable or oral agent n/a Ostomy pouch emptied and vented if applicable NA All implantable devices assessed, documented and approved NA Intravenous access site secured and place NA Valuables secured Linens and cotton and cotton/polyester blend (less than 51% polyester) Personal oil-based products / skin lotions / body lotions removed Wigs or hairpieces removed NA Smoking or tobacco materials removed Books / newspapers / magazines / loose paper removed Cologne, aftershave, perfume and deodorant removed Jewelry removed (may wrap wedding band) Make-up removed NA Hair care products removed Battery operated devices (external) removed NA Heating patches and chemical warmers removed NA Titanium eyewear removed NA Nail polish cured greater than 10 hours NA Casting material cured greater than 10 hours NA Hearing aids removed NA Loose dentures or partials removed NA Prosthetics have been removed NA Patient demonstrates correct use of air break device (if applicable) Patient concerns have been addressed Patient grounding bracelet on and cord attached to chamber Specifics for Inpatients (complete in addition to above) Medication sheet sent with patient Intravenous medications needed  or due during therapy sent with patient Drainage tubes (e.g. nasogastric tube or chest tube secured and vented) Endotracheal or Tracheotomy tube secured Cuff deflated of air and inflated with saline Airway suctioned Electronic Signature(s) Signed: 01/17/2020 4:50:53 PM By: [name] [name] EMT/HBOT/SD  Entered ByMikeal [name] on 01/17/2020 16:50:53

## 2020-01-18 ENCOUNTER — Encounter (HOSPITAL_BASED_OUTPATIENT_CLINIC_OR_DEPARTMENT_OTHER): Payer: Medicaid Other | Admitting: Internal Medicine

## 2020-01-18 DIAGNOSIS — C024 Malignant neoplasm of lingual tonsil: Secondary | ICD-10-CM | POA: Diagnosis not present

## 2020-01-19 ENCOUNTER — Encounter (HOSPITAL_BASED_OUTPATIENT_CLINIC_OR_DEPARTMENT_OTHER): Payer: Medicaid Other | Admitting: Internal Medicine

## 2020-01-23 ENCOUNTER — Encounter (HOSPITAL_BASED_OUTPATIENT_CLINIC_OR_DEPARTMENT_OTHER): Payer: Medicaid Other | Admitting: Internal Medicine

## 2020-01-24 ENCOUNTER — Other Ambulatory Visit: Payer: Self-pay

## 2020-01-24 ENCOUNTER — Encounter (HOSPITAL_BASED_OUTPATIENT_CLINIC_OR_DEPARTMENT_OTHER): Payer: Medicaid Other | Attending: Physician Assistant | Admitting: Physician Assistant

## 2020-01-24 DIAGNOSIS — L598 Other specified disorders of the skin and subcutaneous tissue related to radiation: Secondary | ICD-10-CM | POA: Diagnosis not present

## 2020-01-24 DIAGNOSIS — Z8589 Personal history of malignant neoplasm of other organs and systems: Secondary | ICD-10-CM | POA: Insufficient documentation

## 2020-01-24 DIAGNOSIS — M272 Inflammatory conditions of jaws: Secondary | ICD-10-CM | POA: Insufficient documentation

## 2020-01-24 DIAGNOSIS — Y842 Radiological procedure and radiotherapy as the cause of abnormal reaction of the patient, or of later complication, without mention of misadventure at the time of the procedure: Secondary | ICD-10-CM | POA: Insufficient documentation

## 2020-01-25 ENCOUNTER — Encounter (HOSPITAL_BASED_OUTPATIENT_CLINIC_OR_DEPARTMENT_OTHER): Payer: Medicaid Other | Admitting: Internal Medicine

## 2020-01-25 DIAGNOSIS — Z8589 Personal history of malignant neoplasm of other organs and systems: Secondary | ICD-10-CM | POA: Diagnosis not present

## 2020-01-25 DIAGNOSIS — L598 Other specified disorders of the skin and subcutaneous tissue related to radiation: Secondary | ICD-10-CM | POA: Diagnosis not present

## 2020-01-25 DIAGNOSIS — M272 Inflammatory conditions of jaws: Secondary | ICD-10-CM | POA: Diagnosis not present

## 2020-01-25 NOTE — Progress Notes (Signed)
EYDAN, [name] [name] ([id number]) Visit Report for 01/24/2020 Arrival Information Details Patient Name: Date of Service: [name] [name], [name] [name] 01/24/2020 3:00 PM Medical Record Number: [id number] Patient Account Number: [id number] Date of Birth/Sex: Treating RN: 27-Jan-1961 (59 y.o. [name] Primary Care Jasiri Hanawalt: [name] Other Clinician: [name] Referring Nema Oatley: Treating Bingham Millette/Extender: [name] Weeks in Treatment: 4 Visit Information History Since Last Visit Added or deleted any medications: No Patient Arrived: Ambulatory Any new allergies or adverse reactions: No Arrival Time: 14:55 Had a fall or experienced change in No Accompanied By: self activities of daily living that may affect Transfer Assistance: None risk of falls: Patient Identification Verified: Yes Signs or symptoms of abuse/neglect since last visito No Secondary Verification Process Completed: Yes Hospitalized since last visit: No Patient Requires Transmission-Based Precautions: No Implantable device outside of the clinic excluding No Patient Has Alerts: No cellular tissue based products placed in the center since last visit: Pain Present Now: No Electronic Signature(s) Signed: 01/25/2020 5:11:29 PM By: [name] Entered By: [name] on 01/24/2020 16:53:53 -------------------------------------------------------------------------------- Encounter Discharge Information Details Patient Name: Date of Service: [name] Coe. 01/24/2020 3:00 PM Medical Record Number: [id number] Patient Account Number: [id number] Date of Birth/Sex: Treating RN: May 24, 1961 (59 y.o. [name] Primary Care Yaiza Palazzola: [name] Other Clinician: [name] Referring Monicia Tse: Treating Greenlee Ancheta/Extender: [name] Weeks in Treatment: 4 Encounter Discharge Information Items Discharge Condition: Stable Ambulatory Status:  Ambulatory Discharge Destination: Home Transportation: Private Auto Accompanied By: self Schedule Follow-up Appointment: Yes Clinical Summary of Care: Patient Declined Electronic Signature(s) Signed: 01/25/2020 5:11:29 PM By: [name] Entered By: [name] on 01/24/2020 17:07:17 -------------------------------------------------------------------------------- Patient/Caregiver Education Details Patient Name: Date of Service: [name] [name], [name] [name] 7/7/2021andnbsp3:00 PM Medical Record Number: [id number] Patient Account Number: [id number] Date of Birth/Gender: Treating RN: [birth year] (59 y.o. [name] Primary Care Physician: [name] Other Clinician: [name] Referring Physician: Treating Physician/Extender: [name] Weeks in Treatment: 4 Education Assessment Education Provided To: Patient Education Topics Provided Hyperbaric Oxygenation: Methods: Explain/Verbal Responses: State content correctly Electronic Signature(s) Signed: 01/25/2020 5:11:29 PM By: [name] Entered By: [name] on 01/24/2020 17:07:02 -------------------------------------------------------------------------------- Vitals Details Patient Name: Date of Service: [name] Coe. 01/24/2020 3:00 PM Medical Record Number: [id number] Patient Account Number: [id number] Date of Birth/Sex: Treating RN: June 24, 1961 (60 y.o. [name] Primary Care Nyra Anspaugh: [name] Other Clinician: [name] Referring Kyden Potash: Treating Talah Cookston/Extender: Eloy End, RYLEE Weeks in Treatment: 4 Vital Signs Time Taken: 14:57 Temperature (F): 98.2 Height (in): 75 Pulse (bpm): 115 Weight (lbs): 190 Respiratory Rate (breaths/min): 18 Body Mass Index (BMI): 23.7 Blood Pressure (mmHg): 124/98 Reference Range: 80 - 120 mg / dl Electronic Signature(s) Signed: 01/25/2020 5:11:29 PM By: [name] Entered By: [name] on  01/24/2020 16:54:25

## 2020-01-25 NOTE — Progress Notes (Signed)
[name], [name] ([id number]) Visit Report for 01/25/2020 Arrival Information Details Patient Name: Date of Service: [name], [name] 01/25/2020 3:00 PM Medical Record Number: [id number] Patient Account Number: [id number] Date of Birth/Sex: Treating RN: [birth year] (59 y.o. [name] [name], [name] [name] Primary Care Nijae Doyel: [name] Other Clinician: [name] Referring Charelle Petrakis: Treating Atiba Kimberlin/Extender: [name] Weeks in Treatment: 4 Visit Information History Since Last Visit Added or deleted any medications: No Patient Arrived: Ambulatory Any new allergies or adverse reactions: No Arrival Time: 14:51 Had a fall or experienced change in No Accompanied By: self activities of daily living that may affect Transfer Assistance: None risk of falls: Patient Identification Verified: Yes Signs or symptoms of abuse/neglect since last visito No Secondary Verification Process Completed: Yes Hospitalized since last visit: No Patient Requires Transmission-Based Precautions: No Implantable device outside of the clinic excluding No Patient Has Alerts: No cellular tissue based products placed in the center since last visit: Pain Present Now: No Electronic Signature(s) Signed: 01/25/2020 5:11:29 PM By: [name] Entered By: [name] on 01/25/2020 15:35:20 -------------------------------------------------------------------------------- Encounter Discharge Information Details Patient Name: Date of Service: [name] Coe. 01/25/2020 3:00 PM Medical Record Number: [id number] Patient Account Number: [id number] Date of Birth/Sex: Treating RN: Jan 09, 1961 (59 y.o. [name] [name] Primary Care Akosua Constantine: [name] Other Clinician: [name] Referring Ivonne Freeburg: Treating Emoni Yang/Extender: [name] Weeks in Treatment: 4 Encounter Discharge Information Items Discharge Condition: Stable Ambulatory Status: Ambulatory Discharge  Destination: Home Transportation: Private Auto Accompanied By: self Schedule Follow-up Appointment: Yes Clinical Summary of Care: Patient Declined Electronic Signature(s) Signed: 01/25/2020 5:11:29 PM By: [name] Entered By: [name] on 01/25/2020 17:09:04 -------------------------------------------------------------------------------- Patient/Caregiver Education Details Patient Name: Date of Service: [name] [name] 7/8/2021andnbsp3:00 PM Medical Record Number: [id number] Patient Account Number: [id number] Date of Birth/Gender: Treating RN: 01-01-1961 (58 y.o. [name] [name] Primary Care Physician: [name] Other Clinician: [name] Referring Physician: Treating Physician/Extender: Renard Hamper in Treatment: 4 Education Assessment Education Provided To: Patient Education Topics Provided Hyperbaric Oxygenation: Methods: Explain/Verbal Responses: State content correctly Electronic Signature(s) Signed: 01/25/2020 5:11:29 PM By: [name] Entered By: [name] on 01/25/2020 17:08:49 -------------------------------------------------------------------------------- Vitals Details Patient Name: Date of Service: [name] Coe. 01/25/2020 3:00 PM Medical Record Number: [id number] Patient Account Number: [id number] Date of Birth/Sex: Treating RN: 20-Mar-1961 (59 y.o. [name] [name] Primary Care Aidan Moten: [name] Other Clinician: [name] Referring Maiah Sinning: Treating Rashada Klontz/Extender: [name] Weeks in Treatment: 4 Vital Signs Time Taken: 14:53 Temperature (F): 98.1 Height (in): 75 Pulse (bpm): 76 Weight (lbs): 190 Respiratory Rate (breaths/min): 14 Body Mass Index (BMI): 23.7 Blood Pressure (mmHg): 121/92 Reference Range: 80 - 120 mg / dl Electronic Signature(s) Signed: 01/25/2020 5:11:29 PM By: [name] Entered By: [name] on 01/25/2020 15:35:46

## 2020-01-25 NOTE — Progress Notes (Signed)
GIUSEPPE, DUCHEMIN ([id number]) Visit Report for 01/24/2020 SuperBill Details Patient Name: Date of Service: [name] [name], HINNENKAMP 01/24/2020 Medical Record Number: [id number] Patient Account Number: [id number] Date of Birth/Sex: Treating RN: 08/21/1960 (59 y.o. 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

## 2020-01-25 NOTE — Progress Notes (Signed)
CASIN, FEDERICI ([id number]) Visit Report for 01/25/2020 HBO Details Patient Name: Date of Service: [name] [name], [name] [name] 01/25/2020 3:00 PM Medical Record Number: [id number] Patient Account Number: [id number] Date of Birth/Sex: Treating RN: 01/22/1961 (59 y.o. 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 HBO Treatment Details Treatment Number: 12 Patient Type: Outpatient Chamber Type: Monoplace Chamber Serial #: [id number] Treatment Protocol: 2.5 ATA with 90 minutes oxygen, with two 5 minute air breaks Treatment Details Compression Rate Down: 2.0 psi / minute De-Compression Rate Up: 3.0 psi / minute A breaks and breathing ir Compress Tx Pressure periods Decompress Decompress Begins Reached (leave unused spaces Begins Ends blank) Chamber Pressure (ATA 1 2.5 2.5 2.5 2.5 2.5 - - 2.5 1 ) Clock Time (24 hr) 14:59 15:12 15:42 15:47 16:17 16:22 - - 16:52 16:58 Treatment Length: 119 (minutes) Treatment Segments: 4 Vital Signs Capillary Blood Glucose Reference Range: 80 - 120 mg / dl HBO Diabetic Blood Glucose Intervention Range: <131 mg/dl or >249 mg/dl Time Vitals Blood Respiratory Capillary Blood Glucose Pulse Action Type: Pulse: Temperature: Taken: Pressure: Rate: Glucose (mg/dl): Meter #: Oximetry (%) Taken: Pre 14:53 121/92 76 14 98.1 Post 17:00 121/92 76 14 98 Treatment Response Treatment Toleration: Well Treatment Completion Status: Treatment Completed without Adverse Event Reagen Haberman Notes No concerns with treatment given Physician HBO Attestation: I certify that I supervised this HBO treatment in accordance with  Medicare guidelines. A trained emergency response team is readily available per Yes hospital policies and procedures. Continue HBOT as ordered. Yes Electronic Signature(s) Signed: 01/25/2020 5:28:43 PM By: Linton Ham MD Previous Signature: 01/25/2020 5:11:29 PM Version By: [name] Entered By: Linton Ham on 01/25/2020 17:26:00 -------------------------------------------------------------------------------- HBO Safety Checklist Details Patient Name: Date of Service: [name] [name] 01/25/2020 3:00 PM Medical Record Number: [id number] Patient Account Number: [id number] Date of Birth/Sex: Treating RN: Feb 17, 1961 (59 y.o. [name] Primary Care Saree Krogh: [name] Other Clinician: [name] Referring Adelayde Minney: Treating Angad Nabers/Extender: [name] Weeks in Treatment: 4 HBO Safety Checklist Items Safety Checklist Consent Form Signed Patient voided / foley secured and emptied When did you last eato N/A Last dose of injectable or oral agent Ostomy pouch emptied and vented if applicable NA All implantable devices assessed, documented and approved NA Intravenous access site secured and place NA Valuables secured Linens and cotton and cotton/polyester blend (less than 51% polyester) Personal oil-based products / skin lotions / body lotions removed Wigs or hairpieces removed NA Smoking or tobacco materials removed Books / newspapers / magazines / loose paper removed Cologne, aftershave, perfume and deodorant removed Jewelry removed (may wrap wedding band) Make-up removed NA Hair care products removed NA Battery operated devices (external) removed NA Heating patches and chemical warmers removed NA Titanium eyewear removed NA Nail polish cured greater than 10 hours NA Casting material cured greater than 10 hours NA Hearing aids removed NA Loose dentures or partials removed NA Prosthetics have been  removed NA Patient demonstrates correct use of air break device (if applicable) Patient concerns have been addressed Patient grounding bracelet on and cord attached to chamber Specifics for Inpatients (complete in addition to above) Medication sheet sent with patient Intravenous medications needed or due during  therapy sent with patient Drainage tubes (e.g. nasogastric tube or chest tube secured and vented) Endotracheal or Tracheotomy tube secured Cuff deflated of air and inflated with saline Airway suctioned Electronic Signature(s) Signed: 01/25/2020 5:11:29 PM By: [name] Entered By: [name] on 01/25/2020 15:36:53

## 2020-01-25 NOTE — Progress Notes (Signed)
JAVEN, HINDERLITER ([id number]) Visit Report for 01/24/2020 HBO Details Patient Name: Date of Service: [name] [name], [name] [name] 01/24/2020 3:00 PM Medical Record Number: [id number] Patient Account Number: [id number] Date of Birth/Sex: Treating RN: 1961-05-28 (59 y.o. 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 HBO Treatment Details Treatment Number: 11 Patient Type: Outpatient Chamber Type: Monoplace Chamber Serial #: [id number] Treatment Protocol: 2.5 ATA with 90 minutes oxygen, with two 5 minute air breaks Treatment Details Compression Rate Down: 2.0 psi / minute De-Compression Rate Up: 3.0 psi / minute A breaks and breathing ir Compress Tx Pressure periods Decompress Decompress Begins Reached (leave unused spaces Begins Ends blank) Chamber Pressure (ATA 1 2.5 2.5 2.5 2.5 2.5 - - 2.5 1 ) Clock Time (24 hr) 15:03 15:15 15:45 15:50 16:20 16:25 - - 16:55 17:00 Treatment Length: 117 (minutes) Treatment Segments: 4 Vital Signs Capillary Blood Glucose Reference Range: 80 - 120 mg / dl HBO Diabetic Blood Glucose Intervention Range: <131 mg/dl or >249 mg/dl Time Vitals Blood Respiratory Capillary Blood Glucose Pulse Action Type: Pulse: Temperature: Taken: Pressure: Rate: Glucose (mg/dl): Meter #: Oximetry (%) Taken: Pre 14:57 124/98 115 18 98.2 Post 17:05 124/77 84 16 98 Treatment Response Treatment Toleration: Well Treatment Completion Status: Treatment Completed without Adverse Event Electronic Signature(s) Signed: 01/24/2020 5:11:14 PM By: [name] PA-C Signed: 01/25/2020 5:11:29 PM By: [name] Entered By: [name] on 01/24/2020 17:06:27 -------------------------------------------------------------------------------- HBO Safety Checklist Details Patient Name: Date of Service: [name] [name]. 01/24/2020 3:00 PM Medical Record Number: [id number] Patient Account Number: [id number] Date of Birth/Sex: Treating RN: December 19, 1960 (59 y.o. [name] Primary Care Brindy Higginbotham: [name] Other Clinician: [name] Referring Seaborn Nakama: Treating Allea Kassner/Extender: [name] Weeks in Treatment: 4 HBO Safety Checklist Items Safety Checklist Consent Form Signed Patient voided / foley secured and emptied When did you last eato N/A Last dose of injectable or oral agent Ostomy pouch emptied and vented if applicable NA All implantable devices assessed, documented and approved NA Intravenous access site secured and place NA Valuables secured Linens and cotton and cotton/polyester blend (less than 51% polyester) Personal oil-based products / skin lotions / body lotions removed Wigs or hairpieces removed NA Smoking or tobacco materials removed Books / newspapers / magazines / loose paper removed Cologne, aftershave, perfume and deodorant removed Jewelry removed (may wrap wedding band) Make-up removed NA Hair care products removed NA Battery operated devices (external) removed NA Heating patches and chemical warmers removed NA Titanium eyewear removed NA Nail polish cured greater than 10 hours NA Casting material cured greater than 10 hours NA Hearing aids removed NA Loose dentures or partials removed NA Prosthetics have been removed NA Patient demonstrates correct use of air break device (if applicable) Patient concerns have been addressed Patient grounding bracelet on and cord attached to chamber Specifics for Inpatients (complete in addition to above) Medication sheet sent with patient Intravenous medications needed or due during therapy sent  with patient Drainage tubes (e.g. nasogastric tube or chest tube secured and vented) Endotracheal or Tracheotomy tube secured Cuff deflated of air and inflated with saline Airway suctioned Electronic Signature(s) Signed: 01/25/2020 5:11:29 PM By: [name] Entered By:  [name] on 01/24/2020 16:55:01

## 2020-01-25 NOTE — Progress Notes (Addendum)
ROCKNE, DEARINGER ([id number]) Visit Report for 01/25/2020 SuperBill Details Patient Name: Date of Service: [name] [name], [name] [name] 01/25/2020 Medical Record Number: [id number] Patient Account Number: [id number] Date of Birth/Sex: Treating RN: 1961/04/24 (59 y.o. 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, unspecified Facility Procedures CPT4 Code Description Modifier Quantity 20100712 99183-Physician attendance and supervision of hyperbaric oxygen therapy, per session 1 ICD-10 Diagnosis Description L59.8 Other specified disorders of the skin and subcutaneous tissue related to radiation Physician Procedures Quantity CPT4 Code Description Modifier 1975883 25498 - WC PHYS HYPERBARIC OXYGEN THERAPY 1 ICD-10 Diagnosis Description L59.8 Other specified disorders of the skin and subcutaneous tissue related to radiation Electronic Signature(s) Signed: 01/29/2020 9:27:12 AM By: [name] Signed: 01/30/2020 8:12:39 AM By: Linton Ham MD Previous Signature: 01/25/2020 5:11:29 PM Version By: [name] Previous Signature: 01/25/2020 5:28:43 PM Version By: Linton Ham MD Entered By: [name] on 01/29/2020 09:25:26

## 2020-01-26 ENCOUNTER — Encounter (HOSPITAL_BASED_OUTPATIENT_CLINIC_OR_DEPARTMENT_OTHER): Payer: Medicaid Other | Admitting: Internal Medicine

## 2020-01-26 ENCOUNTER — Other Ambulatory Visit: Payer: Self-pay

## 2020-01-26 DIAGNOSIS — M272 Inflammatory conditions of jaws: Secondary | ICD-10-CM | POA: Diagnosis not present

## 2020-01-26 DIAGNOSIS — Z8589 Personal history of malignant neoplasm of other organs and systems: Secondary | ICD-10-CM | POA: Diagnosis not present

## 2020-01-26 DIAGNOSIS — L598 Other specified disorders of the skin and subcutaneous tissue related to radiation: Secondary | ICD-10-CM | POA: Diagnosis not present

## 2020-01-29 ENCOUNTER — Other Ambulatory Visit: Payer: Self-pay

## 2020-01-29 ENCOUNTER — Encounter (HOSPITAL_BASED_OUTPATIENT_CLINIC_OR_DEPARTMENT_OTHER): Payer: Medicaid Other | Admitting: Internal Medicine

## 2020-01-29 DIAGNOSIS — M272 Inflammatory conditions of jaws: Secondary | ICD-10-CM | POA: Diagnosis not present

## 2020-01-29 DIAGNOSIS — L598 Other specified disorders of the skin and subcutaneous tissue related to radiation: Secondary | ICD-10-CM | POA: Diagnosis not present

## 2020-01-29 DIAGNOSIS — Z8589 Personal history of malignant neoplasm of other organs and systems: Secondary | ICD-10-CM | POA: Diagnosis not present

## 2020-01-30 ENCOUNTER — Encounter (HOSPITAL_BASED_OUTPATIENT_CLINIC_OR_DEPARTMENT_OTHER): Payer: Medicaid Other | Admitting: Internal Medicine

## 2020-01-30 ENCOUNTER — Other Ambulatory Visit: Payer: Self-pay

## 2020-01-30 DIAGNOSIS — M272 Inflammatory conditions of jaws: Secondary | ICD-10-CM | POA: Diagnosis not present

## 2020-01-30 DIAGNOSIS — Z8589 Personal history of malignant neoplasm of other organs and systems: Secondary | ICD-10-CM | POA: Diagnosis not present

## 2020-01-30 DIAGNOSIS — L598 Other specified disorders of the skin and subcutaneous tissue related to radiation: Secondary | ICD-10-CM | POA: Diagnosis not present

## 2020-01-30 NOTE — Progress Notes (Addendum)
[name] [name], [name] [name] ([id number]) Visit Report for 01/29/2020 Arrival Information Details Patient Name: Date of Service: [name] [name], [name] [name] 01/29/2020 3:00 PM Medical Record Number: [id number] Patient Account Number: [id number] Date of Birth/Sex: Treating RN: [birth year] (59 y.o. [name] Primary Care Mikita Lesmeister: [name] Other Clinician: [name] Referring Bela Bonaparte: Treating Artesia Berkey/Extender: [name] Weeks in Treatment: 5 Visit Information History Since Last Visit All ordered tests and consults were completed: No Patient Arrived: Ambulatory Added or deleted any medications: No Arrival Time: 14:54 Any new allergies or adverse reactions: No Accompanied By: self Had a fall or experienced change in No Transfer Assistance: None activities of daily living that may affect Patient Identification Verified: Yes risk of falls: Secondary Verification Process Completed: Yes Signs or symptoms of abuse/neglect since last visito No Patient Requires Transmission-Based Precautions: No Hospitalized since last visit: No Patient Has Alerts: No Implantable device outside of the clinic excluding No cellular tissue based products placed in the center since last visit: Pain Present Now: No Electronic Signature(s) Signed: 01/30/2020 8:39:23 AM By: [name] Entered By: [name] on 01/29/2020 17:06:30 -------------------------------------------------------------------------------- Encounter Discharge Information Details Patient Name: Date of Service: [name] Coe. 01/29/2020 3:00 PM Medical Record Number: [id number] Patient Account Number: [id number] Date of Birth/Sex: Treating RN: 1961/01/13 (59 y.o. [name] Primary Care Dorice Stiggers: [name] Other Clinician: [name] Referring Eiliyah Reh: Treating Tyyne Cliett/Extender: [name] Weeks in Treatment: 5 Encounter Discharge Information Items Discharge  Condition: Stable Ambulatory Status: Ambulatory Discharge Destination: Home Transportation: Private Auto Accompanied By: self Schedule Follow-up Appointment: Yes Clinical Summary of Care: Patient Declined Electronic Signature(s) Signed: 01/30/2020 8:39:23 AM By: [name] Entered By: [name] on 01/29/2020 17:08:53 -------------------------------------------------------------------------------- Patient/Caregiver Education Details Patient Name: Date of Service: [name] [name], [name] [name] 7/12/2021andnbsp3:00 PM Medical Record Number: [id number] Patient Account Number: [id number] Date of Birth/Gender: Treating RN: [birth year] (59 y.o. [name] Primary Care Physician: [name] Other Clinician: [name] Referring Physician: Treating Physician/Extender: Renard Hamper in Treatment: 5 Education Assessment Education Provided To: Patient Education Topics Provided Hyperbaric Oxygenation: Methods: Explain/Verbal Responses: State content correctly Electronic Signature(s) Signed: 01/30/2020 8:39:23 AM By: [name] Entered By: [name] on 01/29/2020 17:08:35 -------------------------------------------------------------------------------- Norris Details Patient Name: Date of Service: [name] Coe. 01/29/2020 3:00 PM Medical Record Number: [id number] Patient Account Number: [id number] Date of Birth/Sex: Treating RN: 1960/11/14 (59 y.o. [name] Primary Care Tahsin Benyo: [name] Other Clinician: [name] Referring Terrence Wishon: Treating Iyanla Eilers/Extender: [name] Weeks in Treatment: 5 Vital Signs Time Taken: 12:56 Temperature (F): 98.1 Height (in): 75 Pulse (bpm): 80 Weight (lbs): 190 Respiratory Rate (breaths/min): 18 Body Mass Index (BMI): 23.7 Blood Pressure (mmHg): 127/90 Reference Range: 80 - 120 mg / dl Electronic Signature(s) Signed: 01/30/2020 8:39:23 AM By: [name] Entered By: [name] on 01/29/2020 17:06:51

## 2020-01-30 NOTE — Progress Notes (Addendum)
KANOA, [name] [name] ([id number]) Visit Report for 01/29/2020 HBO Details Patient Name: Date of Service: [name] [name], SHUKLA 01/29/2020 3:00 PM Medical Record Number: [id number] Patient Account Number: [id number] Date of Birth/Sex: Treating RN: Oct 25, 1960 (59 y.o. 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 HBO Treatment Details Treatment Number: 14 Patient Type: Outpatient Chamber Type: Monoplace Chamber Serial #: [id number] Treatment Protocol: 2.5 ATA with 90 minutes oxygen, with two 5 minute air breaks Treatment Details Compression Rate Down: 2.0 psi / minute De-Compression Rate Up: 3.0 psi / minute A breaks and breathing ir Compress Tx Pressure periods Decompress Decompress Begins Reached (leave unused spaces Begins Ends blank) Chamber Pressure (ATA 1 2.5 2.5 2.5 2.5 2.5 - - 2.5 1 ) Clock Time (24 hr) 15:00 15:12 15:42 15:47 16:17 16:22 - - 16:52 16:58 Treatment Length: 118 (minutes) Treatment Segments: 4 Vital Signs Capillary Blood Glucose Reference Range: 80 - 120 mg / dl HBO Diabetic Blood Glucose Intervention Range: <131 mg/dl or >249 mg/dl Time Vitals Blood Respiratory Capillary Blood Glucose Pulse Action Type: Pulse: Temperature: Taken: Pressure: Rate: Glucose (mg/dl): Meter #: Oximetry (%) Taken: Pre 12:56 127/90 80 18 98.1 Post 15:00 129/98 75 46 98.3 Treatment Response Treatment Toleration: Well Treatment Completion Status: Treatment Completed without Adverse Event Naasia Weilbacher Notes No concerns with treatment given Physician HBO Attestation: I certify that I supervised this HBO treatment in accordance with  Medicare guidelines. A trained emergency response team is readily available per Yes hospital policies and procedures. Continue HBOT as ordered. Yes Electronic Signature(s) Signed: 01/30/2020 8:12:39 AM By: Linton Ham MD Entered By: Linton Ham on 01/29/2020 17:25:41 -------------------------------------------------------------------------------- HBO Safety Checklist Details Patient Name: Date of Service: ARMOND, [name] [name] 01/29/2020 3:00 PM Medical Record Number: [id number] Patient Account Number: [id number] Date of Birth/Sex: Treating RN: Jul 22, 1960 (59 y.o. [name] Primary Care Semiyah Newgent: [name] Other Clinician: [name] Referring Thierno Hun: Treating Kila Godina/Extender: [name] Weeks in Treatment: 5 HBO Safety Checklist Items Safety Checklist Consent Form Signed Patient voided / foley secured and emptied When did you last eato N/A Last dose of injectable or oral agent Ostomy pouch emptied and vented if applicable NA All implantable devices assessed, documented and approved NA Intravenous access site secured and place NA Valuables secured Linens and cotton and cotton/polyester blend (less than 51% polyester) Personal oil-based products / skin lotions / body lotions removed Wigs or hairpieces removed Smoking or tobacco materials removed Books / newspapers / magazines / loose paper removed Cologne, aftershave, perfume and deodorant removed Jewelry removed (may wrap wedding band) Make-up removed NA Hair care products removed NA Battery operated devices (external) removed NA Heating patches and chemical warmers removed NA Titanium eyewear removed NA Nail polish cured greater than 10 hours NA Casting material cured greater than 10 hours NA Hearing aids removed NA Loose dentures or partials removed NA Prosthetics have been removed NA Patient demonstrates correct use of air break device (if  applicable) Patient concerns have been addressed Patient grounding bracelet on and cord attached to chamber Specifics for Inpatients (complete in addition to above) Medication sheet sent with patient Intravenous medications needed or due during therapy sent with patient Drainage tubes (e.g. nasogastric tube or  chest tube secured and vented) Endotracheal or Tracheotomy tube secured Cuff deflated of air and inflated with saline Airway suctioned Electronic Signature(s) Signed: 01/30/2020 8:39:23 AM By: [name] Entered By: [name] on 01/29/2020 17:07:20

## 2020-01-30 NOTE — Progress Notes (Addendum)
[name], [name] ([id number]) Visit Report for 01/30/2020 SuperBill Details Patient Name: Date of Service: [name], [name] 01/30/2020 Medical Record Number: [id number] Patient Account Number: [id number] Date of Birth/Sex: Treating RN: 1960/10/03 (59 y.o. 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, unspecified Facility Procedures CPT4 Code Description Modifier Quantity [id number] 99183-Physician attendance and supervision of hyperbaric oxygen therapy, per session 1 ICD-10 Diagnosis Description L59.8 Other specified disorders of the skin and subcutaneous tissue related to radiation Physician Procedures Quantity CPT4 Code Description Modifier [id number] 89373 - WC PHYS HYPERBARIC OXYGEN THERAPY 1 ICD-10 Diagnosis Description L59.8 Other specified disorders of the skin and subcutaneous tissue related to radiation Electronic Signature(s) Signed: 01/30/2020 5:10:59 PM By: [name] Signed: 01/30/2020 5:16:39 PM By: Linton Ham MD Entered By: [name] on 01/30/2020 17:10:06

## 2020-01-30 NOTE — Progress Notes (Addendum)
LEHI, PHIFER ([id number]) Visit Report for 01/26/2020 Problem List Details Patient Name: Date of Service: [name] [name], [name] [name] 01/26/2020 3:00 PM Medical Record Number: [id number] Patient Account Number: [id number] Date of Birth/Sex: Treating RN: 1961/07/20 (59 y.o. 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, unspecified Facility Procedures CPT4 Code: [id number] Description: [id number] attendance and supervision of hyperbaric oxygen therapy, per session ICD-10 Diagnosis Description L59.8 Other specified disorders of the skin and subcutaneous tissue  related to radiation Modifier: Quantity: 1 Physician Procedures : CPT4 Code Description Modifier [id number] 66440 - WC PHYS HYPERBARIC OXYGEN THERAPY ICD-10 Diagnosis Description L59.8 Other specified disorders of the skin and subcutaneous tissue related to radiation Quantity: 1 Electronic Signature(s) Signed: 01/29/2020 9:26:10 AM By: [name] EMT/HBOT/SD Signed: 01/30/2020 8:12:39 AM By: Linton Ham MD Previous Signature: 01/26/2020 5:14:52 PM Version By: [name] EMT/HBOT/SD Entered By: [name] on 01/29/2020 09:25:54

## 2020-01-30 NOTE — Progress Notes (Addendum)
MADOC, HOLQUIN ([id number]) Visit Report for 01/29/2020 SuperBill Details Patient Name: Date of Service: [name] [name], [name] [name] 01/29/2020 Medical Record Number: [id number] Patient Account Number: [id number] Date of Birth/Sex: Treating RN: 02/01/1961 (59 y.o. 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, unspecified Facility Procedures CPT4 Code Description Modifier Quantity [id number] 99183-Physician attendance and supervision of hyperbaric oxygen therapy, per session 1 ICD-10 Diagnosis Description L59.8 Other specified disorders of the skin and subcutaneous tissue related to radiation Physician Procedures Quantity CPT4 Code Description Modifier 3016010 93235 - WC PHYS HYPERBARIC OXYGEN THERAPY 1 ICD-10 Diagnosis Description L59.8 Other specified disorders of the skin and subcutaneous tissue related to radiation Electronic Signature(s) Signed: 01/30/2020 8:39:23 AM By: [name] Signed: 01/30/2020 5:16:39 PM By: Linton Ham MD Previous Signature: 01/30/2020 8:12:39 AM Version By: Linton Ham MD Entered By: [name] on 01/30/2020 08:39:05

## 2020-01-30 NOTE — Progress Notes (Addendum)
[name] [name], [name] [name] ([id number]) Visit Report for 01/30/2020 HBO Details Patient Name: Date of Service: [name] [name] 01/30/2020 3:00 PM Medical Record Number: [id number] Patient Account Number: [id number] Date of Birth/Sex: Treating RN: [birth year] (59 y.o. 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 HBO Treatment Details Treatment Number: 15 Patient Type: Outpatient Chamber Type: Monoplace Chamber Serial #: [id number] Treatment Protocol: 2.5 ATA with 90 minutes oxygen, with two 5 minute air breaks Treatment Details Compression Rate Down: 2.0 psi / minute De-Compression Rate Up: 3.0 psi / minute A breaks and breathing ir Compress Tx Pressure periods Decompress Decompress Begins Reached (leave unused spaces Begins Ends blank) Chamber Pressure (ATA 1 2.5 2.5 2.5 2.5 2.5 - - 2.5 1 ) Clock Time (24 hr) 15:00 15:12 15:42 15:47 16:17 16:22 - - 16:52 16:58 Treatment Length: 118 (minutes) Treatment Segments: 4 Vital Signs Capillary Blood Glucose Reference Range: 80 - 120 mg / dl HBO Diabetic Blood Glucose Intervention Range: <131 mg/dl or >249 mg/dl Time Vitals Blood Respiratory Capillary Blood Glucose Pulse Action Type: Pulse: Temperature: Taken: Pressure: Rate: Glucose (mg/dl): Meter #: Oximetry (%) Taken: Pre 14:55 99/76 83 14 98.3 Post 17:00 127/83 79 16 98.1 Treatment Response Treatment Toleration: Well Treatment Completion Status: Treatment Completed without Adverse Event [name] [name] Notes No concerns with treatment given. Patient was also seen for 30-day evaluation Physician HBO Attestation: I certify that I  supervised this HBO treatment in accordance with Medicare guidelines. A trained emergency response team is readily available per Yes hospital policies and procedures. Continue HBOT as ordered. Yes Electronic Signature(s) Signed: 01/30/2020 5:16:39 PM By: [name] [name] Previous Signature: 01/30/2020 5:10:59 PM Version By: [name] [name] Entered By: [name] [name] on 01/30/2020 17:15:22 -------------------------------------------------------------------------------- HBO Safety Checklist Details Patient Name: Date of Service: [name] [name] 01/30/2020 3:00 PM Medical Record Number: [id number] Patient Account Number: [id number] Date of Birth/Sex: Treating RN: 08-10-1960 (59 y.o. [name] [name]) [name] [name] Primary Care [name] [name]: Terrill [name] Other Clinician: Minerva [name] Referring Amadeus Oyama: Treating [name] [name]/Extender: [name] [name] Weeks in Treatment: 5 HBO Safety Checklist Items Safety Checklist Consent Form Signed Patient voided / foley secured and emptied When did you last eato N/A Last dose of injectable or oral agent Ostomy pouch emptied and vented if applicable NA All implantable devices assessed, documented and approved NA Intravenous access site secured and place NA Valuables secured Linens and cotton and cotton/polyester blend (less than 51% polyester) Personal oil-based products / skin lotions / body lotions removed Wigs or hairpieces removed NA Smoking or tobacco materials removed Books / newspapers / magazines / loose paper removed Cologne, aftershave, perfume and deodorant removed Jewelry removed (may wrap wedding band) Make-up removed NA Hair care products removed NA Battery operated devices (external) removed NA Heating patches and chemical warmers removed NA Titanium eyewear removed NA Nail polish cured greater than 10 hours NA Casting material cured greater than 10 hours NA Hearing aids removed NA Loose dentures or partials  removed NA Prosthetics have been removed NA Patient demonstrates correct use of air break device (if applicable) Patient concerns have been addressed Patient grounding bracelet on and cord attached to chamber Specifics for Inpatients (complete in addition to above) Medication sheet sent with  patient Intravenous medications needed or due during therapy sent with patient Drainage tubes (e.g. nasogastric tube or chest tube secured and vented) Endotracheal or Tracheotomy tube secured Cuff deflated of air and inflated with saline Airway suctioned Electronic Signature(s) Signed: 01/30/2020 5:10:59 PM By: [name] [name] Entered By: [name] [name] on 01/30/2020 17:09:04

## 2020-01-30 NOTE — Progress Notes (Addendum)
[name], [name] ([id number]) Visit Report for 01/30/2020 Arrival Information Details Patient Name: Date of Service: [name], [name] 01/30/2020 3:00 PM Medical Record Number: [id number] Patient Account Number: [id number] Date of Birth/Sex: Treating RN: 1961-05-14 (59 y.o. [name]) [name] Primary Care [place]: [name] Other Clinician: [name] Referring Taijuan Serviss: Treating Erma Raiche/Extender: [name] Weeks in Treatment: 5 Visit Information History Since Last Visit Added or deleted any medications: No Patient Arrived: Ambulatory Any new allergies or adverse reactions: No Arrival Time: 14:52 Had a fall or experienced change in No Accompanied By: self activities of daily living that may affect Transfer Assistance: None risk of falls: Patient Identification Verified: Yes Signs or symptoms of abuse/neglect since last visito No Secondary Verification Process Completed: Yes Hospitalized since last visit: No Patient Requires Transmission-Based Precautions: No Implantable device outside of the clinic excluding No Patient Has Alerts: No cellular tissue based products placed in the center since last visit: Pain Present Now: No Electronic Signature(s) Signed: 01/30/2020 5:10:59 PM By: [name] Entered By: [name] on 01/30/2020 17:08:16 -------------------------------------------------------------------------------- Encounter Discharge Information Details Patient Name: Date of Service: [name] [name]. 01/30/2020 3:00 PM Medical Record Number: [id number] Patient Account Number: [id number] Date of Birth/Sex: Treating RN: 1960/08/21 (59 y.o. [name] [name] Primary Care Lore Polka: [name] Other Clinician: [name] Referring Lamon Rotundo: Treating Brocha Gilliam/Extender: [name] Weeks in Treatment: 5 Encounter Discharge Information Items Discharge Condition: Stable Ambulatory Status: Ambulatory Discharge  Destination: Home Transportation: Private Auto Accompanied By: self Schedule Follow-up Appointment: Yes Clinical Summary of Care: Patient Declined Electronic Signature(s) Signed: 01/30/2020 5:10:59 PM By: [name] Entered By: [name] on 01/30/2020 17:10:38 -------------------------------------------------------------------------------- Patient/Caregiver Education Details Patient Name: Date of Service: [name], [name] 7/13/2021andnbsp3:00 PM Medical Record Number: [id number] Patient Account Number: [id number] Date of Birth/Gender: Treating RN: 1961-04-03 (59 y.o. [name] [name] Primary Care Physician: [name] Other Clinician: [name] Referring Physician: Treating Physician/Extender: [name] Weeks in Treatment: 5 Education Assessment Education Provided To: Patient Education Topics Provided Hyperbaric Oxygenation: Methods: Explain/Verbal Responses: State content correctly Electronic Signature(s) Signed: 02/05/2020 10:45:18 AM By: [name] Previous Signature: 01/30/2020 5:10:59 PM Version By: [name] Entered By: [name] on 02/05/2020 10:45:00 -------------------------------------------------------------------------------- Vitals Details Patient Name: Date of Service: [name] [name]. 01/30/2020 3:00 PM Medical Record Number: [id number] Patient Account Number: [id number] Date of Birth/Sex: Treating RN: 1961/06/03 (59 y.o. [name] [name] Primary Care Rhett Mutschler: [name] Other Clinician: [name] Referring Andretta Ergle: Treating [name] [name]/Extender: Birdena Crandall, RYLEE Weeks in Treatment: 5 Vital Signs Time Taken: 14:55 Temperature (F): 98.3 Height (in): 75 Pulse (bpm): 83 Weight (lbs): 190 Respiratory Rate (breaths/min): 14 Body Mass Index (BMI): 23.7 Blood Pressure (mmHg): 99/76 Reference Range: 80 - 120 mg / dl Electronic Signature(s) Signed: 01/30/2020 5:10:59 PM By:  [name] Entered By: [name] on 01/30/2020 17:08:32

## 2020-01-30 NOTE — Progress Notes (Addendum)
GILLERMO, POCH ([id number]) Visit Report for 01/26/2020 HBO Details Patient Name: Date of Service: [name] [name], [name] [name] 01/26/2020 3:00 PM Medical Record Number: [id number] Patient Account Number: [id number] Date of Birth/Sex: Treating RN: [birth year] (59 y.o. 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 HBO Treatment Details Treatment Number: 13 Patient Type: Outpatient Chamber Type: Monoplace Chamber Serial #: [id number] Treatment Protocol: 2.5 ATA with 90 minutes oxygen, with two 5 minute air breaks Treatment Details Compression Rate Down: 2.0 psi / minute De-Compression Rate Up: 3.0 psi / minute A breaks and breathing ir Compress Tx Pressure periods Decompress Decompress Begins Reached (leave unused spaces Begins Ends blank) Chamber Pressure (ATA 1 2.5 2.5 2.5 2.5 2.5 - - 2.5 1 ) Clock Time (24 hr) 15:03 15:15 15:45 15:50 16:20 16:25 - - 16:55 17:01 Treatment Length: 118 (minutes) Treatment Segments: 4 Vital Signs Capillary Blood Glucose Reference Range: 80 - 120 mg / dl HBO Diabetic Blood Glucose Intervention Range: <131 mg/dl or >249 mg/dl Time Vitals Blood Respiratory Capillary Blood Glucose Pulse Action Type: Pulse: Temperature: Taken: Pressure: Rate: Glucose (mg/dl): Meter #: Oximetry (%) Taken: Pre 15:02 124/95 74 18 98.2 Post 17:02 130/94 77 16 98 Treatment Response Treatment Toleration: Well Treatment Completion Status: Treatment Completed without Adverse Event Alenna Russell Notes No concerns with treatment given Physician HBO Attestation: I certify that I supervised this HBO treatment in accordance with  Medicare guidelines. A trained emergency response team is readily available per Yes hospital policies and procedures. Continue HBOT as ordered. Yes Electronic Signature(s) Signed: 01/30/2020 8:12:39 AM By: Linton Ham MD Previous Signature: 01/26/2020 5:14:52 PM Version By: [name] EMT/HBOT/SD Entered By: Linton Ham on 01/26/2020 17:26:18 -------------------------------------------------------------------------------- HBO Safety Checklist Details Patient Name: Date of Service: FAYETTE, HAMADA 01/26/2020 3:00 PM Medical Record Number: [id number] Patient Account Number: [id number] Date of Birth/Sex: Treating RN: February 28, 1961 (59 y.o. [name] Primary Care Latosha Gaylord: [name] Other Clinician: [name] Referring Khian Remo: Treating Alahni Varone/Extender: [name] Weeks in Treatment: 5 HBO Safety Checklist Items Safety Checklist Consent Form Signed Patient voided / foley secured and emptied When did you last eato N/A Last dose of injectable or oral agent n/a Ostomy pouch emptied and vented if applicable NA All implantable devices assessed, documented and approved NA Intravenous access site secured and place NA Valuables secured Linens and cotton and cotton/polyester blend (less than 51% polyester) Personal oil-based products / skin lotions / body lotions removed Wigs or hairpieces removed NA Smoking or tobacco materials removed Books / newspapers / magazines / loose paper removed Cologne, aftershave, perfume and deodorant removed Jewelry removed (may wrap wedding band) Make-up removed NA Hair care products removed NA Battery operated devices (external) removed NA Heating patches and chemical warmers removed NA Titanium eyewear removed NA Nail polish cured greater than 10 hours NA Casting material cured greater than 10 hours NA Hearing aids removed NA Loose dentures or partials removed NA Prosthetics have  been removed NA Patient demonstrates correct use of air break device (if applicable) Patient concerns have been addressed Patient grounding bracelet on and cord attached to chamber Specifics for Inpatients (complete in addition to above) Medication sheet sent with patient Intravenous medications needed or  due during therapy sent with patient Drainage tubes (e.g. nasogastric tube or chest tube secured and vented) Endotracheal or Tracheotomy tube secured Cuff deflated of air and inflated with saline Airway suctioned Electronic Signature(s) Signed: 01/26/2020 5:14:52 PM By: [name] EMT/HBOT/SD Entered By: [name] on 01/26/2020 17:13:05

## 2020-01-31 ENCOUNTER — Encounter (HOSPITAL_BASED_OUTPATIENT_CLINIC_OR_DEPARTMENT_OTHER): Payer: Medicaid Other | Admitting: Physician Assistant

## 2020-01-31 NOTE — Progress Notes (Signed)
PERRY, BRUCATO ([id number]) Visit Report for 01/26/2020 Arrival Information Details Patient Name: Date of Service: [name] [name], [name] [name] 01/26/2020 3:00 PM Medical Record Number: [id number] Patient Account Number: [id number] Date of Birth/Sex: Treating RN: 18-Sep-1960 (59 y.o. [name] Primary Care Khani Paino: [name] Other Clinician: [name] Referring Johndavid Geralds: Treating Esma Kilts/Extender: [name] Weeks in Treatment: 5 Visit Information History Since Last Visit Added or deleted any medications: No Patient Arrived: Ambulatory Any new allergies or adverse reactions: No Arrival Time: 15:00 Had a fall or experienced change in No Accompanied By: self activities of daily living that may affect Transfer Assistance: None risk of falls: Patient Identification Verified: Yes Signs or symptoms of abuse/neglect since last visito No Secondary Verification Process Completed: Yes Hospitalized since last visit: No Patient Requires Transmission-Based Precautions: No Implantable device outside of the clinic excluding No Patient Has Alerts: No cellular tissue based products placed in the center since last visit: Pain Present Now: No Electronic Signature(s) Signed: 01/29/2020 9:27:12 AM By: [name] Entered By: [name] on 01/26/2020 15:11:30 -------------------------------------------------------------------------------- Encounter Discharge Information Details Patient Name: Date of Service: [name] Coe. 01/26/2020 3:00 PM Medical Record Number: [id number] Patient Account Number: [id number] Date of Birth/Sex: Treating RN: [birth year] (59 y.o. [name] Primary Care Auda Finfrock: [name] Other Clinician: [name] Referring Yocelyn Brocious: Treating Hayla Hinger/Extender: [name] Weeks in Treatment: 5 Encounter Discharge Information Items Discharge Condition: Stable Ambulatory Status:  Ambulatory Discharge Destination: Home Transportation: Private Auto Accompanied By: self Schedule Follow-up Appointment: Yes Clinical Summary of Care: Patient Declined Electronic Signature(s) Signed: 01/26/2020 5:14:52 PM By: [name] EMT/HBOT/SD Entered By: [name] on 01/26/2020 17:14:38 -------------------------------------------------------------------------------- Patient/Caregiver Education Details Patient Name: Date of Service: [name] [name], [name] [name] 7/9/2021andnbsp3:00 PM Medical Record Number: [id number] Patient Account Number: [id number] Date of Birth/Gender: Treating RN: September 16, 1960 (59 y.o. [name] Primary Care Physician: [name] Other Clinician: [name] Referring Physician: Treating Physician/Extender: Renard Hamper in Treatment: 5 Education Assessment Education Provided To: Patient Education Topics Provided Hyperbaric Oxygenation: Methods: Explain/Verbal Responses: State content correctly Electronic Signature(s) Signed: 01/26/2020 5:14:52 PM By: [name] EMT/HBOT/SD Entered By: [name] on 01/26/2020 17:14:25 -------------------------------------------------------------------------------- Vitals Details Patient Name: Date of Service: [name] Coe. 01/26/2020 3:00 PM Medical Record Number: [id number] Patient Account Number: [id number] Date of Birth/Sex: Treating RN: 06-11-1961 (59 y.o. [name] Primary Care Judson Tsan: [name] Other Clinician: [name] Referring Chandlar Guice: Treating Zalma Channing/Extender: [name] Weeks in Treatment: 5 Vital Signs Time Taken: 15:02 Temperature (F): 98.2 Height (in): 75 Pulse (bpm): 74 Weight (lbs): 190 Respiratory Rate (breaths/min): 18 Body Mass Index (BMI): 23.7 Blood Pressure (mmHg): 124/95 Reference Range: 80 - 120 mg / dl Electronic Signature(s) Signed: 01/29/2020 9:27:12 AM By: [name] Entered By: [name] on 01/26/2020 15:11:50

## 2020-02-01 ENCOUNTER — Encounter (HOSPITAL_BASED_OUTPATIENT_CLINIC_OR_DEPARTMENT_OTHER): Payer: Medicaid Other | Admitting: Internal Medicine

## 2020-02-01 ENCOUNTER — Other Ambulatory Visit: Payer: Self-pay | Admitting: *Deleted

## 2020-02-01 DIAGNOSIS — M272 Inflammatory conditions of jaws: Secondary | ICD-10-CM | POA: Diagnosis not present

## 2020-02-01 DIAGNOSIS — L598 Other specified disorders of the skin and subcutaneous tissue related to radiation: Secondary | ICD-10-CM | POA: Diagnosis not present

## 2020-02-01 DIAGNOSIS — I712 Thoracic aortic aneurysm, without rupture, unspecified: Secondary | ICD-10-CM

## 2020-02-01 DIAGNOSIS — Z8589 Personal history of malignant neoplasm of other organs and systems: Secondary | ICD-10-CM | POA: Diagnosis not present

## 2020-02-01 NOTE — Progress Notes (Signed)
[name] [name], [name] [name] ([id number]) Visit Report for 02/01/2020 SuperBill Details Patient Name: Date of Service: [name] [name], [name] [name] 02/01/2020 Medical Record Number: [id number] Patient Account Number: [id number] Date of Birth/Sex: Treating RN: [birth year] (59 y.o. 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

## 2020-02-01 NOTE — Progress Notes (Signed)
[name] [name], [name] [name] ([id number]) Visit Report for 02/01/2020 HBO Details Patient Name: Date of Service: [name] [name], [name] [name] 02/01/2020 3:00 PM Medical Record Number: [id number] Patient Account Number: [id number] Date of Birth/Sex: Treating RN: 1960/08/06 (59 y.o. 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 HBO Treatment Details Treatment Number: 16 Patient Type: Outpatient Chamber Type: Monoplace Chamber Serial #: [id number] Treatment Protocol: 2.5 ATA with 90 minutes oxygen, with two 5 minute air breaks Treatment Details Compression Rate Down: 2.0 psi / minute De-Compression Rate Up: 3.0 psi / minute A breaks and breathing ir Compress Tx Pressure periods Decompress Decompress Begins Reached (leave unused spaces Begins Ends blank) Chamber Pressure (ATA 1 2.5 2.5 2.5 2.5 2.5 - - 2.5 1 ) Clock Time (24 hr) 15:11 15:23 15:53 15:58 16:28 16:33 - - 17:03 17:09 Treatment Length: 118 (minutes) Treatment Segments: 4 Vital Signs Capillary Blood Glucose Reference Range: 80 - 120 mg / dl HBO Diabetic Blood Glucose Intervention Range: <131 mg/dl or >249 mg/dl Time Vitals Blood Respiratory Capillary Blood Glucose Pulse Action Type: Pulse: Temperature: Taken: Pressure: Rate: Glucose (mg/dl): Meter #: Oximetry (%) Taken: Pre 15:03 123/81 89 15 98.4 Post 17:11 128/89 74 14 98 Treatment Response Treatment Toleration: Well Treatment Completion Status: Treatment Completed without Adverse Event Kessa Fairbairn Notes No concerns with treatment given Physician HBO Attestation: I certify that I supervised this HBO treatment in accordance with  Medicare guidelines. A trained emergency response team is readily available per Yes hospital policies and procedures. Continue HBOT as ordered. Yes Electronic Signature(s) Signed: 02/01/2020 5:51:19 PM By: Linton Ham MD Previous Signature: 02/01/2020 5:27:30 PM Version By: [name] EMT/HBOT/SD Entered By: Linton Ham on 02/01/2020 17:50:36 -------------------------------------------------------------------------------- HBO Safety Checklist Details Patient Name: Date of Service: [name] [name], [name] [name]. 02/01/2020 3:00 PM Medical Record Number: [id number] Patient Account Number: [id number] Date of Birth/Sex: Treating RN: June 24, 1961 (59 y.o. [name] Primary Care Eesha Schmaltz: [name] Other Clinician: [name] Referring Igor Bishop: Treating Adarsh Mundorf/Extender: [name] Weeks in Treatment: 5 HBO Safety Checklist Items Safety Checklist Consent Form Signed Patient voided / foley secured and emptied When did you last eato N/A Last dose of injectable or oral agent n/a Ostomy pouch emptied and vented if applicable NA All implantable devices assessed, documented and approved NA Intravenous access site secured and place NA Valuables secured Linens and cotton and cotton/polyester blend (less than 51% polyester) Personal oil-based products / skin lotions / body lotions removed Wigs or hairpieces removed NA Smoking or tobacco materials removed Books / newspapers / magazines / loose paper removed Cologne, aftershave, perfume and deodorant removed Jewelry removed (may wrap wedding band) Make-up removed NA Hair care products removed NA Battery operated devices (external) removed NA Heating patches and chemical warmers removed NA Titanium eyewear removed NA Nail polish cured greater than 10 hours NA Casting material cured greater than 10 hours NA Hearing aids removed NA Loose dentures or partials removed NA Prosthetics have  been removed NA Patient demonstrates correct use of air break device (if applicable) Patient concerns have been addressed Patient grounding bracelet on and cord attached to chamber Specifics for Inpatients (complete in addition to above) Medication sheet sent with patient Intravenous medications needed or  due during therapy sent with patient Drainage tubes (e.g. nasogastric tube or chest tube secured and vented) Endotracheal or Tracheotomy tube secured Cuff deflated of air and inflated with saline Airway suctioned Electronic Signature(s) Signed: 02/01/2020 5:27:30 PM By: [name] EMT/HBOT/SD Entered By: [name] on 02/01/2020 17:22:22

## 2020-02-02 ENCOUNTER — Encounter (HOSPITAL_BASED_OUTPATIENT_CLINIC_OR_DEPARTMENT_OTHER): Payer: Medicaid Other | Admitting: Internal Medicine

## 2020-02-02 DIAGNOSIS — Z8589 Personal history of malignant neoplasm of other organs and systems: Secondary | ICD-10-CM | POA: Diagnosis not present

## 2020-02-02 DIAGNOSIS — M272 Inflammatory conditions of jaws: Secondary | ICD-10-CM | POA: Diagnosis not present

## 2020-02-02 DIAGNOSIS — L598 Other specified disorders of the skin and subcutaneous tissue related to radiation: Secondary | ICD-10-CM | POA: Diagnosis not present

## 2020-02-05 ENCOUNTER — Encounter (HOSPITAL_BASED_OUTPATIENT_CLINIC_OR_DEPARTMENT_OTHER): Payer: Medicaid Other | Admitting: Internal Medicine

## 2020-02-05 ENCOUNTER — Other Ambulatory Visit: Payer: Self-pay

## 2020-02-05 DIAGNOSIS — M272 Inflammatory conditions of jaws: Secondary | ICD-10-CM | POA: Diagnosis not present

## 2020-02-05 DIAGNOSIS — L598 Other specified disorders of the skin and subcutaneous tissue related to radiation: Secondary | ICD-10-CM | POA: Diagnosis not present

## 2020-02-05 DIAGNOSIS — Z8589 Personal history of malignant neoplasm of other organs and systems: Secondary | ICD-10-CM | POA: Diagnosis not present

## 2020-02-05 NOTE — Progress Notes (Signed)
BIENVENIDO, PROEHL ([id number]) Visit Report for 02/02/2020 HBO Details Patient Name: Date of Service: [name] [name], [name] [name] 02/02/2020 3:00 PM Medical Record Number: [id number] Patient Account Number: [id number] Date of Birth/Sex: Treating RN: [birth year] (59 y.o. 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 HBO Treatment Details Treatment Number: 17 Patient Type: Outpatient Chamber Type: Monoplace Chamber Serial #: [id number] Treatment Protocol: 2.5 ATA with 90 minutes oxygen, with two 5 minute air breaks Treatment Details Compression Rate Down: 2.0 psi / minute De-Compression Rate Up: 3.0 psi / minute A breaks and breathing ir Compress Tx Pressure periods Decompress Decompress Begins Reached (leave unused spaces Begins Ends blank) Chamber Pressure (ATA 1 2.5 2.5 2.5 2.5 2.5 - - 2.5 1 ) Clock Time (24 hr) 15:27 15:39 16:09 16:14 16:44 16:49 - - 17:19 17:25 Treatment Length: 118 (minutes) Treatment Segments: 4 Vital Signs Capillary Blood Glucose Reference Range: 80 - 120 mg / dl HBO Diabetic Blood Glucose Intervention Range: <131 mg/dl or >249 mg/dl Time Vitals Blood Respiratory Capillary Blood Glucose Pulse Action Type: Pulse: Temperature: Taken: Pressure: Rate: Glucose (mg/dl): Meter #: Oximetry (%) Taken: Pre 15:20 121/80 90 14 98.4 Post 17:27 121/88 90 14 98.2 Treatment Response Treatment Toleration: Well Treatment Completion Status: Treatment Completed without Adverse Event Armine Rizzolo Notes No concerns with treatment given Physician HBO Attestation: I certify that I supervised this HBO treatment in accordance  with Medicare guidelines. A trained emergency response team is readily available per Yes hospital policies and procedures. Continue HBOT as ordered. Yes Electronic Signature(s) Signed: 02/05/2020 8:03:52 AM By: Linton Ham MD Previous Signature: 02/02/2020 5:36:16 PM Version By: [name] EMT/HBOT/SD Entered By: Linton Ham on 02/05/2020 08:02:01 -------------------------------------------------------------------------------- HBO Safety Checklist Details Patient Name: Date of Service: [name] [name], [name] [name]. 02/02/2020 3:00 PM Medical Record Number: [id number] Patient Account Number: [id number] Date of Birth/Sex: Treating RN: Apr 27, 1961 (59 y.o. [name] Primary Care Zakariya Knickerbocker: [name] Other Clinician: [name] Referring Frisco Cordts: Treating Levonne Carreras/Extender: [name] Weeks in Treatment: 6 HBO Safety Checklist Items Safety Checklist Consent Form Signed Patient voided / foley secured and emptied When did you last eato N/A Last dose of injectable or oral agent n/a Ostomy pouch emptied and vented if applicable NA All implantable devices assessed, documented and approved NA Intravenous access site secured and place NA Valuables secured Linens and cotton and cotton/polyester blend (less than 51% polyester) Personal oil-based products / skin lotions / body lotions removed Wigs or hairpieces removed NA Smoking or tobacco materials removed Books / newspapers / magazines / loose paper removed Cologne, aftershave, perfume and deodorant removed Jewelry removed (may wrap wedding band) Make-up removed NA Hair care products removed NA Battery operated devices (external) removed NA Heating patches and chemical warmers removed NA Titanium eyewear removed NA Nail polish cured greater than 10 hours NA Casting material cured greater than 10 hours NA Hearing aids removed NA Loose dentures or partials removed NA Prosthetics  have been removed NA Patient demonstrates correct use of air break device (if applicable) Patient concerns have been addressed Patient grounding bracelet on and cord attached to chamber Specifics for Inpatients (complete in addition to above) Medication sheet sent with patient Intravenous medications needed or  due during therapy sent with patient Drainage tubes (e.g. nasogastric tube or chest tube secured and vented) Endotracheal or Tracheotomy tube secured Cuff deflated of air and inflated with saline Airway suctioned Electronic Signature(s) Signed: 02/02/2020 5:36:16 PM By: [name] EMT/HBOT/SD Entered By: [name] on 02/02/2020 17:34:36

## 2020-02-05 NOTE — Progress Notes (Addendum)
ZACH, TIETJE ([id number]) Visit Report for 02/05/2020 HBO Details Patient Name: Date of Service: [name] [name], [name] [name] 02/05/2020 3:00 PM Medical Record Number: [id number] Patient Account Number: [id number] Date of Birth/Sex: Treating RN: 1961-04-28 (59 y.o. 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 HBO Treatment Details Treatment Number: 18 Patient Type: Outpatient Chamber Type: Monoplace Chamber Serial #: [id number] Treatment Protocol: 2.5 ATA with 90 minutes oxygen, with two 5 minute air breaks Treatment Details Compression Rate Down: 2.0 psi / minute De-Compression Rate Up: 3.0 psi / minute A breaks and breathing Decompress Decompress ir Compress Tx Pressure periods Begins Reached Begins Ends (leave unused spaces blank) Chamber Pressure (ATA 1 2.5 2.5 2.5 2.5 2.5 - - 2.5 1 ) Clock Time (24 hr) 15:04 15:16 15:46 15:51 16:21 16:26 16:56 - 16:56 17:02 Treatment Length: 118 (minutes) Treatment Segments: 4 Vital Signs Capillary Blood Glucose Reference Range: 80 - 120 mg / dl HBO Diabetic Blood Glucose Intervention Range: <131 mg/dl or >249 mg/dl Time Vitals Blood Respiratory Capillary Blood Glucose Pulse Action Type: Pulse: Temperature: Taken: Pressure: Rate: Glucose (mg/dl): Meter #: Oximetry (%) Taken: Pre 14:56 105/87 72 18 98.3 Post 17:05 131/97 88 15 98.1 Treatment Response Treatment Toleration: Well Treatment Completion Status: Treatment Completed without Adverse Event Electronic Signature(s) Signed: 02/05/2020 5:17:54 PM By: [name] EMT/HBOT/SD Signed: 02/06/2020 6:00:01 PM By: Linton Ham  MD Entered By: [name] on 02/05/2020 17:17:01 -------------------------------------------------------------------------------- HBO Safety Checklist Details Patient Name: Date of Service: [name] 02/05/2020 3:00 PM Medical Record Number: [id number] Patient Account Number: [id number] Date of Birth/Sex: Treating RN: 1960/10/05 (59 y.o. [name] Primary Care Nekeshia Lenhardt: [name] Other Clinician: [name] Referring Deloros Beretta: Treating Cathlyn Tersigni/Extender: [name] Weeks in Treatment: 6 HBO Safety Checklist Items Safety Checklist Consent Form Signed Patient voided / foley secured and emptied When did you last eato N/A Last dose of injectable or oral agent n/a Ostomy pouch emptied and vented if applicable NA All implantable devices assessed, documented and approved NA Intravenous access site secured and place NA Valuables secured Linens and cotton and cotton/polyester blend (less than 51% polyester) Personal oil-based products / skin lotions / body lotions removed Wigs or hairpieces removed NA Smoking or tobacco materials removed Books / newspapers / magazines / loose paper removed Cologne, aftershave, perfume and deodorant removed Jewelry removed (may wrap wedding band) Make-up removed NA Hair care products removed Battery operated devices (external) removed NA Heating patches and chemical warmers removed NA Titanium eyewear removed NA Nail polish cured greater than 10 hours NA Casting material cured greater than 10 hours NA Hearing aids removed NA Loose dentures or partials removed NA Prosthetics have been removed NA Patient demonstrates correct use of air break device (if applicable) Patient concerns have been addressed Patient grounding bracelet on and cord attached to chamber Specifics for Inpatients (complete in addition to above) Medication sheet sent with patient Intravenous medications needed or  due during therapy sent with patient Drainage tubes (e.g. nasogastric tube or chest tube secured and vented) Endotracheal or Tracheotomy tube secured Cuff deflated of air and inflated with saline Airway suctioned Electronic Signature(s) Signed: 02/05/2020 4:20:06 PM By: [name] EMT/HBOT/SD Entered By: Ronnald Ramp,  Dedrick on 02/05/2020 16:20:06

## 2020-02-05 NOTE — Progress Notes (Signed)
HAKIEM, MALIZIA ([id number]) Visit Report for 02/02/2020 Arrival Information Details Patient Name: Date of Service: GARDY, MONTANARI 02/02/2020 3:00 PM Medical Record Number: [id number] Patient Account Number: [id number] Date of Birth/Sex: Treating RN: [birth year] (59 y.o. [name] Primary Care Yazmyne Sara: [name] Other Clinician: [name] Referring Davelle Anselmi: Treating Allesha Aronoff/Extender: [name] Weeks in Treatment: 6 Visit Information History Since Last Visit Added or deleted any medications: No Patient Arrived: Ambulatory Any new allergies or adverse reactions: No Arrival Time: 14:45 Had a fall or experienced change in No Accompanied By: self activities of daily living that may affect Transfer Assistance: None risk of falls: Patient Identification Verified: Yes Signs or symptoms of abuse/neglect since last visito No Secondary Verification Process Completed: Yes Hospitalized since last visit: No Patient Requires Transmission-Based Precautions: No Implantable device outside of the clinic excluding No Patient Has Alerts: No cellular tissue based products placed in the center since last visit: Pain Present Now: No Electronic Signature(s) Signed: 02/05/2020 10:45:18 AM By: [name] Entered By: [name] on 02/02/2020 15:42:49 -------------------------------------------------------------------------------- Encounter Discharge Information Details Patient Name: Date of Service: [name] 02/02/2020 3:00 PM Medical Record Number: [id number] Patient Account Number: [id number] Date of Birth/Sex: Treating RN: May 13, 1961 (59 y.o. [name] Primary Care Nilah Belcourt: [name] Other Clinician: [name] Referring Tray Klayman: Treating Lauralye Kinn/Extender: [name] Weeks in Treatment: 6 Encounter Discharge Information Items Discharge Condition: Stable Ambulatory Status:  Ambulatory Discharge Destination: Home Transportation: Private Auto Accompanied By: self Schedule Follow-up Appointment: Yes Clinical Summary of Care: Patient Declined Electronic Signature(s) Signed: 02/02/2020 5:36:16 PM By: [name] EMT/HBOT/SD Entered By: [name] on 02/02/2020 17:35:54 -------------------------------------------------------------------------------- Patient/Caregiver Education Details Patient Name: Date of Service: JAMAAL, BERNASCONI 7/16/2021andnbsp3:00 PM Medical Record Number: [id number] Patient Account Number: [id number] Date of Birth/Gender: Treating RN: 08/02/1960 (59 y.o. [name] Primary Care Physician: [name] Other Clinician: [name] Referring Physician: Treating Physician/Extender: Renard Hamper in Treatment: 6 Education Assessment Education Provided To: Patient Education Topics Provided Hyperbaric Oxygenation: Methods: Explain/Verbal Responses: State content correctly Electronic Signature(s) Signed: 02/02/2020 5:36:16 PM By: [name] EMT/HBOT/SD Entered By: [name] on 02/02/2020 17:35:40 -------------------------------------------------------------------------------- Vitals Details Patient Name: Date of Service: [name] 02/02/2020 3:00 PM Medical Record Number: [id number] Patient Account Number: [id number] Date of Birth/Sex: Treating RN: 12-21-1960 (59 y.o. [name] Primary Care Tasfia Vasseur: [name] Other Clinician: [name] Referring Samier Jaco: Treating Leonard Feigel/Extender: [name] Weeks in Treatment: 6 Vital Signs Time Taken: 15:20 Temperature (F): 98.4 Height (in): 75 Pulse (bpm): 90 Weight (lbs): 190 Respiratory Rate (breaths/min): 14 Body Mass Index (BMI): 23.7 Blood Pressure (mmHg): 121/80 Reference Range: 80 - 120 mg / dl Electronic Signature(s) Signed: 02/05/2020 10:45:18 AM By: [name] Entered By: [name] on 02/02/2020 15:43:20

## 2020-02-05 NOTE — Progress Notes (Signed)
[name] [name], [name] [name] ([id number]) Visit Report for 02/02/2020 SuperBill Details Patient Name: Date of Service: [name] [name], [name] [name] 02/02/2020 Medical Record Number: [id number] Patient Account Number: [id number] Date of Birth/Sex: Treating RN: [birth year] (59 y.o. 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

## 2020-02-05 NOTE — Progress Notes (Signed)
TARVARES, LANT ([id number]) Visit Report for 02/01/2020 Arrival Information Details Patient Name: Date of Service: [name] [name], [name] [name] 02/01/2020 3:00 PM Medical Record Number: [id number] Patient Account Number: [id number] Date of Birth/Sex: Treating RN: October 07, 1960 (59 y.o. [name] Primary Care Hilmar Moldovan: [name] Other Clinician: [name] Referring Keyonte Cookston: Treating D'Arcy Abraha/Extender: [name] Weeks in Treatment: 5 Visit Information History Since Last Visit Added or deleted any medications: No Patient Arrived: Ambulatory Any new allergies or adverse reactions: No Arrival Time: 15:00 Had a fall or experienced change in No Accompanied By: self activities of daily living that may affect Transfer Assistance: None risk of falls: Patient Identification Verified: Yes Signs or symptoms of abuse/neglect since last visito No Secondary Verification Process Completed: Yes Hospitalized since last visit: No Patient Requires Transmission-Based Precautions: No Implantable device outside of the clinic excluding No Patient Has Alerts: No cellular tissue based products placed in the center since last visit: Pain Present Now: No Electronic Signature(s) Signed: 02/05/2020 10:45:18 AM By: [name] Entered By: [name] on 02/01/2020 15:27:03 -------------------------------------------------------------------------------- Encounter Discharge Information Details Patient Name: Date of Service: [name] Coe. 02/01/2020 3:00 PM Medical Record Number: [id number] Patient Account Number: [id number] Date of Birth/Sex: Treating RN: [birth year] (60 y.o. [name] Primary Care Rubee Vega: [name] Other Clinician: [name] Referring Moneisha Vosler: Treating Darcey Cardy/Extender: [name] Weeks in Treatment: 5 Encounter Discharge Information Items Discharge Condition: Stable Ambulatory Status:  Ambulatory Discharge Destination: Home Transportation: Private Auto Accompanied By: self Schedule Follow-up Appointment: Yes Clinical Summary of Care: Patient Declined Electronic Signature(s) Signed: 02/01/2020 5:27:30 PM By: [name] EMT/HBOT/SD Entered By: [name] on 02/01/2020 17:23:45 -------------------------------------------------------------------------------- Patient/Caregiver Education Details Patient Name: Date of Service: [name] [name] 7/15/2021andnbsp3:00 PM Medical Record Number: [id number] Patient Account Number: [id number] Date of Birth/Gender: Treating RN: 11-30-1960 (59 y.o. [name] Primary Care Physician: [name] Other Clinician: [name] Referring Physician: Treating Physician/Extender: Renard Hamper in Treatment: 5 Education Assessment Education Provided To: Patient Education Topics Provided Hyperbaric Oxygenation: Methods: Explain/Verbal Responses: State content correctly Electronic Signature(s) Signed: 02/01/2020 5:27:30 PM By: [name] EMT/HBOT/SD Entered By: [name] on 02/01/2020 17:23:35 -------------------------------------------------------------------------------- Vitals Details Patient Name: Date of Service: [name] Coe. 02/01/2020 3:00 PM Medical Record Number: [id number] Patient Account Number: [id number] Date of Birth/Sex: Treating RN: Jun 22, 1961 (59 y.o. [name] Primary Care Laila Myhre: [name] Other Clinician: [name] Referring Chibuike Fleek: Treating Shequita Peplinski/Extender: [name] Weeks in Treatment: 5 Vital Signs Time Taken: 15:03 Temperature (F): 98.4 Height (in): 75 Pulse (bpm): 89 Weight (lbs): 190 Respiratory Rate (breaths/min): 15 Body Mass Index (BMI): 23.7 Blood Pressure (mmHg): 123/81 Reference Range: 80 - 120 mg / dl Electronic Signature(s) Signed: 02/05/2020 10:45:18 AM By: [name] Entered By: [name] on 02/01/2020 15:27:28

## 2020-02-06 ENCOUNTER — Other Ambulatory Visit: Payer: Self-pay

## 2020-02-06 ENCOUNTER — Encounter (HOSPITAL_BASED_OUTPATIENT_CLINIC_OR_DEPARTMENT_OTHER): Payer: Medicaid Other | Admitting: Internal Medicine

## 2020-02-06 DIAGNOSIS — Z8589 Personal history of malignant neoplasm of other organs and systems: Secondary | ICD-10-CM | POA: Diagnosis not present

## 2020-02-06 DIAGNOSIS — L598 Other specified disorders of the skin and subcutaneous tissue related to radiation: Secondary | ICD-10-CM | POA: Diagnosis not present

## 2020-02-06 DIAGNOSIS — M272 Inflammatory conditions of jaws: Secondary | ICD-10-CM | POA: Diagnosis not present

## 2020-02-06 NOTE — Progress Notes (Signed)
[name] [name], [name] [name] ([id number]) Visit Report for 02/06/2020 Arrival Information Details Patient Name: Date of Service: [name] [name], [name] [name] 02/06/2020 3:00 PM Medical Record Number: [id number] Patient Account Number: [id number] Date of Birth/Sex: Treating RN: Aug 13, 1960 (59 y.o. [name]) [name] Primary Care Delcenia Inman: [name] Other Clinician: [name] Referring Candra Wegner: Treating Davonne Baby/Extender: [name] Weeks in Treatment: 6 Visit Information History Since Last Visit Added or deleted any medications: No Patient Arrived: Ambulatory Any new allergies or adverse reactions: No Arrival Time: 14:55 Had a fall or experienced change in No Accompanied By: self activities of daily living that may affect Transfer Assistance: None risk of falls: Patient Identification Verified: Yes Signs or symptoms of abuse/neglect since last visito No Secondary Verification Process Completed: Yes Hospitalized since last visit: No Patient Requires Transmission-Based Precautions: No Implantable device outside of the clinic excluding No Patient Has Alerts: No cellular tissue based products placed in the center since last visit: Pain Present Now: No Electronic Signature(s) Signed: 02/06/2020 5:14:12 PM By: [name] EMT/HBOT/SD Entered By: [name] on 02/06/2020 15:08:41 -------------------------------------------------------------------------------- Encounter Discharge Information Details Patient Name: Date of Service: [name] Coe. 02/06/2020 3:00 PM Medical Record Number: [id number] Patient Account Number: [id number] Date of Birth/Sex: Treating RN: [birth year] (59 y.o. [name] Primary Care Willisha Sligar: [name] Other Clinician: [name] Referring Tali Coster: Treating Renata Gambino/Extender: [name] Weeks in Treatment: 6 Encounter Discharge Information Items Discharge Condition: Stable Ambulatory Status:  Ambulatory Discharge Destination: Home Transportation: Private Auto Accompanied By: self Schedule Follow-up Appointment: Yes Clinical Summary of Care: Patient Declined Electronic Signature(s) Signed: 02/06/2020 5:14:12 PM By: [name] EMT/HBOT/SD Entered By: [name] on 02/06/2020 17:13:46 -------------------------------------------------------------------------------- Patient/Caregiver Education Details Patient Name: Date of Service: [name] [name], [name] [name] 7/20/2021andnbsp3:00 PM Medical Record Number: [id number] Patient Account Number: [id number] Date of Birth/Gender: Treating RN: 1961/03/25 (59 y.o. [name] Primary Care Physician: [name] Other Clinician: [name] Referring Physician: Treating Physician/Extender: [name] Weeks in Treatment: 6 Education Assessment Education Provided To: Patient Education Topics Provided Hyperbaric Oxygenation: Methods: Explain/Verbal Responses: State content correctly Electronic Signature(s) Signed: 02/06/2020 5:14:12 PM By: [name] EMT/HBOT/SD Entered By: [name] on 02/06/2020 17:13:32 -------------------------------------------------------------------------------- Vitals Details Patient Name: Date of Service: [name] Coe. 02/06/2020 3:00 PM Medical Record Number: [id number] Patient Account Number: [id number] Date of Birth/Sex: Treating RN: 12-18-1960 (59 y.o. [name] Primary Care Briar Sword: [name] Other Clinician: [name] Referring Missy Baksh: Treating Raigan Baria/Extender: Birdena Crandall, RYLEE Weeks in Treatment: 6 Vital Signs Time Taken: 15:00 Temperature (F): 98.4 Height (in): 75 Pulse (bpm): 94 Weight (lbs): 190 Respiratory Rate (breaths/min): 14 Body Mass Index (BMI): 23.7 Blood Pressure (mmHg): 118/72 Reference Range: 80 - 120 mg / dl Electronic Signature(s) Signed: 02/06/2020 5:14:12 PM By: [name]  EMT/HBOT/SD Entered By: [name] on 02/06/2020 15:09:00

## 2020-02-06 NOTE — Progress Notes (Addendum)
[name] [name] ([id number]) Visit Report for 02/06/2020 HBO Details Patient Name: Date of Service: [name] [name], [name] [name] 02/06/2020 3:00 PM Medical Record Number: [id number] Patient Account Number: [id number] Date of Birth/Sex: Treating RN: [birth year] (59 y.o. 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 HBO Treatment Details Treatment Number: 19 Patient Type: Outpatient Chamber Type: Monoplace Chamber Serial #: [id number] Treatment Protocol: 2.5 ATA with 90 minutes oxygen, with two 5 minute air breaks Treatment Details Compression Rate Down: 2.0 psi / minute De-Compression Rate Up: 3.0 psi / minute A breaks and breathing ir Compress Tx Pressure periods Decompress Decompress Begins Reached (leave unused spaces Begins Ends blank) Chamber Pressure (ATA 1 2.5 2.5 2.5 2.5 2.5 - - 2.5 1 ) Clock Time (24 hr) 15:01 15:13 15:43 15:48 16:18 16:23 - - 16:53 16:59 Treatment Length: 118 (minutes) Treatment Segments: 4 Vital Signs Capillary Blood Glucose Reference Range: 80 - 120 mg / dl HBO Diabetic Blood Glucose Intervention Range: <131 mg/dl or >249 mg/dl Time Vitals Blood Respiratory Capillary Blood Glucose Pulse Action Type: Pulse: Temperature: Taken: Pressure: Rate: Glucose (mg/dl): Meter #: Oximetry (%) Taken: Pre 15:00 118/72 94 14 98.4 Post 17:01 134/98 92 15 98.2 Treatment Response Treatment Toleration: Well Treatment Completion Status: Treatment Completed without Adverse Event Debara Kamphuis Notes No concerns with treatment given Physician HBO Attestation: I certify that I supervised this HBO treatment in accordance with  Medicare guidelines. A trained emergency response team is readily available per Yes hospital policies and procedures. Continue HBOT as ordered. Yes Electronic Signature(s) Signed: 02/06/2020 6:00:01 PM By: Linton Ham MD Previous Signature: 02/06/2020 5:14:12 PM Version By: [name] EMT/HBOT/SD Entered By: Linton Ham on 02/06/2020 17:59:04 -------------------------------------------------------------------------------- HBO Safety Checklist Details Patient Name: Date of Service: [name] Coe. 02/06/2020 3:00 PM Medical Record Number: [id number] Patient Account Number: [id number] Date of Birth/Sex: Treating RN: March 11, 1961 (59 y.o. [name] [name]) [name] Primary Care Seher Schlagel: [name] Other Clinician: [name] Referring Eola Waldrep: Treating Alpheus Stiff/Extender: [name] Weeks in Treatment: 6 HBO Safety Checklist Items Safety Checklist Consent Form Signed Patient voided / foley secured and emptied When did you last eato n/a Last dose of injectable or oral agent n/a Ostomy pouch emptied and vented if applicable NA All implantable devices assessed, documented and approved NA Intravenous access site secured and place NA Valuables secured Linens and cotton and cotton/polyester blend (less than 51% polyester) Personal oil-based products / skin lotions / body lotions removed Wigs or hairpieces removed NA Smoking or tobacco materials removed Books / newspapers / magazines / loose paper removed Cologne, aftershave, perfume and deodorant removed Jewelry removed (may wrap wedding band) Make-up removed NA Hair care products removed Battery operated devices (external) removed NA Heating patches and chemical warmers removed NA Titanium eyewear removed NA Nail polish cured greater than 10 hours NA Casting material cured greater than 10 hours NA Hearing aids removed NA Loose dentures or partials removed NA Prosthetics have been  removed NA Patient demonstrates correct use of air break device (if applicable) Patient concerns have been addressed Patient grounding bracelet on and cord attached to chamber Specifics for Inpatients (complete in addition to above) Medication sheet sent with patient Intravenous medications needed or due  during therapy sent with patient Drainage tubes (e.g. nasogastric tube or chest tube secured and vented) Endotracheal or Tracheotomy tube secured Cuff deflated of air and inflated with saline Airway suctioned Electronic Signature(s) Signed: 02/06/2020 3:09:43 PM By: [name] EMT/HBOT/SD Entered By: [name] on 02/06/2020 15:09:43

## 2020-02-06 NOTE — Progress Notes (Signed)
JAMORRIS, NDIAYE ([id number]) Visit Report for 02/06/2020 SuperBill Details Patient Name: Date of Service: [name], [name] 02/06/2020 Medical Record Number: [id number] Patient Account Number: [id number] Date of Birth/Sex: Treating RN: 1960/07/29 (59 y.o. 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

## 2020-02-06 NOTE — Progress Notes (Signed)
[name] [name], [name] [name] ([id number]) Visit Report for 02/05/2020 SuperBill Details Patient Name: Date of Service: [name] [name], [name] [name] 02/05/2020 Medical Record Number: [id number] Patient Account Number: [id number] Date of Birth/Sex: Treating RN: 10-23-1960 (59 y.o. [name] Primary Care Provider: [name] Other Clinician: [name] Referring Provider: Treating Provider/Extender: [name] Weeks in Treatment: 6 Diagnosis Coding ICD-10 Codes Code Description L59.8 Other specified disorders of the skin and subcutaneous tissue related to radiation M27.2 Inflammatory conditions of jaws C09.9 Malignant neoplasm of tonsil, unspecified Facility Procedures CPT4 Code Description Modifier Quantity [id number]-Physician attendance and supervision of hyperbaric oxygen therapy, per session 1 ICD-10 Diagnosis Description L59.8 Other specified disorders of the skin and subcutaneous tissue related to radiation Physician Procedures Quantity CPT4 Code Description Modifier [id number] - WC PHYS HYPERBARIC OXYGEN THERAPY 1 ICD-10 Diagnosis Description L59.8 Other specified disorders of the skin and subcutaneous tissue related to radiation Electronic Signature(s) Signed: 02/05/2020 5:17:54 PM By: [name] EMT/HBOT/SD Signed: 02/06/2020 6:00:01 PM By: Linton Ham MD Entered By: [name] on 02/05/2020 17:17:11

## 2020-02-07 ENCOUNTER — Telehealth: Payer: Self-pay | Admitting: Adult Health Nurse Practitioner

## 2020-02-07 ENCOUNTER — Encounter (HOSPITAL_BASED_OUTPATIENT_CLINIC_OR_DEPARTMENT_OTHER): Payer: Medicaid Other | Admitting: Physician Assistant

## 2020-02-07 NOTE — Telephone Encounter (Signed)
Returned VM from [place], [place].  Left VM with reason for call and contact info Amiaya Mcneeley K. Olena Heckle NP

## 2020-02-08 ENCOUNTER — Other Ambulatory Visit: Payer: Self-pay

## 2020-02-08 ENCOUNTER — Encounter (HOSPITAL_BASED_OUTPATIENT_CLINIC_OR_DEPARTMENT_OTHER): Payer: Medicaid Other | Admitting: Internal Medicine

## 2020-02-08 ENCOUNTER — Other Ambulatory Visit: Payer: Medicaid Other | Admitting: Adult Health Nurse Practitioner

## 2020-02-08 DIAGNOSIS — M272 Inflammatory conditions of jaws: Secondary | ICD-10-CM | POA: Diagnosis not present

## 2020-02-08 DIAGNOSIS — Z8589 Personal history of malignant neoplasm of other organs and systems: Secondary | ICD-10-CM | POA: Diagnosis not present

## 2020-02-08 DIAGNOSIS — C090 Malignant neoplasm of tonsillar fossa: Secondary | ICD-10-CM

## 2020-02-08 DIAGNOSIS — Z515 Encounter for palliative care: Secondary | ICD-10-CM

## 2020-02-08 DIAGNOSIS — L598 Other specified disorders of the skin and subcutaneous tissue related to radiation: Secondary | ICD-10-CM | POA: Diagnosis not present

## 2020-02-08 NOTE — Progress Notes (Signed)
[facility] Consult Note Telephone: [phone]  Fax: [phone]  PATIENT NAME: [name] [name] DOB: November 12, 1960 MRN: [id number]  PRIMARY CARE PROVIDER:   [name], MD  REFERRING PROVIDER:  [name], MD [address] [place],  [place] [address]  RESPONSIBLE PARTY:  self [phone] [name] [name]: [id number] C: [phone]   Due to the COVID-19 crisis, this visit was done via telemedicine and it was initiated and consent by this patient and or family. Video-audio (telehealth) contact was unable to be done due to technical barriers from the patients side.  RECOMMENDATIONS and PLAN:  1.  Advanced care planning. Full code.  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 Radiation necrosis is causing his throat and jaw pain and the sloughing of the skin in his throat which is causing his secretions. Patient states that he should be finished with the hyperbaric therapy at the end of August. States that he has not noted much of a difference yet. 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 Does state that he is able to open it more and has been eating more solid food. States that his weight has been stable and he has not lost any further weight. Continue follow her up and recommendations by oncology and continue hyperbaric therapy.  3. Pain. Patient continues to get good relief with as needed gabapentin 100 mg up to 3 times daily and oxycodone 5 mg every 8 hours as needed for his jaw pain. States that he has only been taking the oxycodone at night and takes 1 to 2 capsules of the gabapentin per day. E prescribed refill for oxycodone 5 mg every 8 hours as needed for pain and sent to [facility] in [place]. Continue current pain regimen  Palliative will continue to monitor  for symptom management/decline and make recommendations as needed. Patient states that he will call to update at the end of August when hyperbaric therapy is finished. If do not hear back from him we will call him to set up follow-up appointment.  I spent 30 minutes providing this consultation,  from 10:00 to 10:30 including time with patient, chart review, provider coordination, documentation. More than 50% of the time in this consultation was spent coordinating communication.   HISTORY OF PRESENT ILLNESS:  [name] [name] is a 59 y.o. year old male with multiple medical problems including tonsillar cancer,HTN, HLD, h/o DVT and PE on coumadin. Palliative Care was asked to help address goals of care.   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    ALLERGIES: No Known  Allergies   PERTINENT MEDICATIONS:  Outpatient Encounter Medications as of 02/08/2020  Medication Sig   atorvastatin (LIPITOR) 20 MG tablet Take 1 tablet (20 mg total) by mouth daily.   cyclobenzaprine (FLEXERIL) 10 MG tablet TAKE 1 TABLET BY MOUTH THREE TIMES A DAY AS NEEDED FOR MUSCLE SPASMS   gabapentin (NEURONTIN) 100 MG capsule Take 200 mg by mouth every 8 (eight) hours as needed.   glycopyrrolate (ROBINUL) 1 MG tablet Take 1-2 tablet by mouth every eight hours as needed take 1-2 tabs every eight hours as needed for secretions   ibuprofen (ADVIL) 200 MG tablet Take 600 mg by mouth every 6 (six) hours as needed for pain or fever.    metoprolol succinate (TOPROL-XL) 25 MG 24 hr tablet TAKE 1 TABLET BY MOUTH EVERY DAY   ondansetron (ZOFRAN) 8 MG tablet TAKE ONE TABLET BY MOUTH TWICE A DAY AS NEEDED.  START ON THIRD DAY AFTER CHEMO (Patient not taking: Reported on 01/12/2020)   oxyCODONE (OXY IR/ROXICODONE) 5 MG immediate release tablet Take 5 mg by mouth every 8 (eight) hours as needed.   pentoxifylline (TRENTAL) 400 MG CR tablet Take 400mg  tab daily x 1 week, then 400mg  BID; take with food. Upset stomach is rare on this med, but stop if you have upset stomach.   sodium fluoride (PREVIDENT 5000 PLUS) 1.1 % CREA dental cream Apply to tooth brush. Brush teeth for 2 minutes. Spit out excess. DO NOT rinse afterwards. Repeat nightly.   vitamin E 180 MG (400 UNITS) capsule Take 400 IU daily x 1 week, then 400 IU BID   warfarin (COUMADIN) 10 MG tablet Take 1 tablet of 10 mg warfarin daily except on Sundays, Thursdays and Saturdays, take only 1/2 tablet (5 mg).   No facility-administered encounter medications on file as of 02/08/2020.      [name] [name] Jenetta Downer, NP

## 2020-02-08 NOTE — Progress Notes (Addendum)
SHOWN, DISSINGER ([id number]) Visit Report for 02/08/2020 HBO Details Patient Name: Date of Service: [name] [name], [name] [name] 02/08/2020 3:00 PM Medical Record Number: [id number] Patient Account Number: [id number] Date of Birth/Sex: Treating RN: Sep 19, 1960 (59 y.o. 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 HBO Treatment Details Treatment Number: 20 Patient Type: Outpatient Chamber Type: Monoplace Chamber Serial #: [id number] Treatment Protocol: 2.5 ATA with 90 minutes oxygen, with two 5 minute air breaks Treatment Details Compression Rate Down: 2.0 psi / minute De-Compression Rate Up: 3.0 psi / minute A breaks and breathing ir Compress Tx Pressure periods Decompress Decompress Begins Reached (leave unused spaces Begins Ends blank) Chamber Pressure (ATA 1 2.5 2.5 2.5 2.5 2.5 - - 2.5 1 ) Clock Time (24 hr) 15:02 15:14 15:44 15:49 16:19 16:24 - - 16:54 17:00 Treatment Length: 118 (minutes) Treatment Segments: 4 Vital Signs Capillary Blood Glucose Reference Range: 80 - 120 mg / dl HBO Diabetic Blood Glucose Intervention Range: <131 mg/dl or >249 mg/dl Time Vitals Blood Respiratory Capillary Blood Glucose Pulse Action Type: Pulse: Temperature: Taken: Pressure: Rate: Glucose (mg/dl): Meter #: Oximetry (%) Taken: Pre 15:00 131/79 71 14 98.4 Post 17:03 135/89 74 14 98 Treatment Response Treatment Toleration: Well Treatment Completion Status: Treatment Completed without Adverse Event Honorio Devol Notes No concerns with treatment given Physician HBO Attestation: I certify that I supervised this HBO treatment in accordance with  Medicare guidelines. A trained emergency response team is readily available per Yes hospital policies and procedures. Continue HBOT as ordered. Yes Electronic Signature(s) Signed: 02/08/2020 5:29:29 PM By: Linton Ham MD Previous Signature: 02/08/2020 5:13:04 PM Version By: [name] EMT/HBOT/SD Entered By: Linton Ham on 02/08/2020 17:28:38 -------------------------------------------------------------------------------- HBO Safety Checklist Details Patient Name: Date of Service: [name] [name], [name] [name] 02/08/2020 3:00 PM Medical Record Number: [id number] Patient Account Number: [id number] Date of Birth/Sex: Treating RN: 09-16-1960 (59 y.o. [name] Primary Care Natsha Guidry: [name] Other Clinician: [name] Referring Andrea Colglazier: Treating Jazmene Racz/Extender: [name] Weeks in Treatment: 6 HBO Safety Checklist Items Safety Checklist Consent Form Signed Patient voided / foley secured and emptied When did you last eato n/a Last dose of injectable or oral agent n/a Ostomy pouch emptied and vented if applicable NA All implantable devices assessed, documented and approved NA Intravenous access site secured and place NA Valuables secured Linens and cotton and cotton/polyester blend (less than 51% polyester) Personal oil-based products / skin lotions / body lotions removed Wigs or hairpieces removed NA Smoking or tobacco materials removed NA Books / newspapers / magazines / loose paper removed Cologne, aftershave, perfume and deodorant removed Jewelry removed (may wrap wedding band) Make-up removed NA Hair care products removed Battery operated devices (external) removed NA Heating patches and chemical warmers removed NA Titanium eyewear removed NA Nail polish cured greater than 10 hours NA Casting material cured greater than 10 hours NA Hearing aids removed NA Loose dentures or partials removed NA Prosthetics have  been removed NA Patient demonstrates correct use of air break device (if applicable) Patient concerns have been addressed Patient grounding bracelet on and cord attached to chamber Specifics for Inpatients (complete in addition to above) Medication sheet sent with patient Intravenous medications needed or  due during therapy sent with patient Drainage tubes (e.g. nasogastric tube or chest tube secured and vented) Endotracheal or Tracheotomy tube secured Cuff deflated of air and inflated with saline Airway suctioned Electronic Signature(s) Signed: 02/08/2020 3:06:30 PM By: [name] EMT/HBOT/SD Entered By: [name] on 02/08/2020 15:06:29

## 2020-02-08 NOTE — Progress Notes (Signed)
GALO, SAYED ([id number]) Visit Report for 02/08/2020 Arrival Information Details Patient Name: Date of Service: LADERRICK, WILK 02/08/2020 3:00 PM Medical Record Number: [id number] Patient Account Number: [id number] Date of Birth/Sex: Treating RN: November 15, 1960 (59 y.o. [name] Primary Care Aliviana Burdell: [name] Other Clinician: [name] Referring Veora Fonte: Treating Ichael Pullara/Extender: [name] Weeks in Treatment: 6 Visit Information History Since Last Visit Added or deleted any medications: No Patient Arrived: Ambulatory Any new allergies or adverse reactions: No Arrival Time: 14:55 Had a fall or experienced change in No Accompanied By: self activities of daily living that may affect Transfer Assistance: None risk of falls: Patient Identification Verified: Yes Signs or symptoms of abuse/neglect since last visito No Secondary Verification Process Completed: Yes Hospitalized since last visit: No Patient Requires Transmission-Based Precautions: No Implantable device outside of the clinic excluding No Patient Has Alerts: No cellular tissue based products placed in the center since last visit: Pain Present Now: No Electronic Signature(s) Signed: 02/08/2020 5:13:04 PM By: [name] EMT/HBOT/SD Entered By: [name] on 02/08/2020 15:05:33 -------------------------------------------------------------------------------- Encounter Discharge Information Details Patient Name: Date of Service: [name] 02/08/2020 3:00 PM Medical Record Number: [id number] Patient Account Number: [id number] Date of Birth/Sex: Treating RN: [birth year] (59 y.o. [name] Primary Care Zarria Towell: [name] Other Clinician: [name] Referring Baer Hinton: Treating Ashaki Frosch/Extender: [name] Weeks in Treatment: 6 Encounter Discharge Information Items Discharge Condition: Stable Ambulatory Status:  Ambulatory Discharge Destination: Home Transportation: Private Auto Accompanied By: self Schedule Follow-up Appointment: Yes Clinical Summary of Care: Patient Declined Electronic Signature(s) Signed: 02/08/2020 5:13:04 PM By: [name] EMT/HBOT/SD Entered By: [name] on 02/08/2020 17:12:45 -------------------------------------------------------------------------------- Patient/Caregiver Education Details Patient Name: Date of Service: BRITNEY, CAPTAIN 7/22/2021andnbsp3:00 PM Medical Record Number: [id number] Patient Account Number: [id number] Date of Birth/Gender: Treating RN: 1961/01/27 (59 y.o. [name] Primary Care Physician: [name] Other Clinician: [name] Referring Physician: Treating Physician/Extender: Renard Hamper in Treatment: 6 Education Assessment Education Provided To: Patient Education Topics Provided Hyperbaric Oxygenation: Methods: Explain/Verbal Responses: State content correctly Electronic Signature(s) Signed: 02/08/2020 5:13:04 PM By: [name] EMT/HBOT/SD Entered By: [name] on 02/08/2020 17:12:33 -------------------------------------------------------------------------------- Vitals Details Patient Name: Date of Service: [name] 02/08/2020 3:00 PM Medical Record Number: [id number] Patient Account Number: [id number] Date of Birth/Sex: Treating RN: [birth year] (59 y.o. [name] Primary Care Sylvan Sookdeo: [name] Other Clinician: [name] Referring Ayva Veilleux: Treating Joevon Holliman/Extender: [name] Weeks in Treatment: 6 Vital Signs Time Taken: 15:00 Temperature (F): 98.4 Height (in): 75 Pulse (bpm): 71 Weight (lbs): 190 Respiratory Rate (breaths/min): 14 Body Mass Index (BMI): 23.7 Blood Pressure (mmHg): 131/79 Reference Range: 80 - 120 mg / dl Electronic Signature(s) Signed: 02/08/2020 5:13:04 PM By: [name]  EMT/HBOT/SD Entered By: [name] on 02/08/2020 15:05:54

## 2020-02-08 NOTE — Progress Notes (Signed)
LUISFELIPE, ENGELSTAD ([id number]) Visit Report for 02/08/2020 SuperBill Details Patient Name: Date of Service: [name] [name], [name] [name] 02/08/2020 Medical Record Number: [id number] Patient Account Number: [id number] Date of Birth/Sex: Treating RN: 03/02/1961 (59 y.o. [name] Primary Care Provider: [name] Other Clinician: [name] Referring Provider: Treating Provider/Extender: [name] Weeks in Treatment: 6 Diagnosis Coding ICD-10 Codes Code Description L59.8 Other specified disorders of the skin and subcutaneous tissue related to radiation M27.2 Inflammatory conditions of jaws C09.9 Malignant neoplasm of tonsil, unspecified Facility Procedures CPT4 Code Description Modifier Quantity [id number] 99183-Physician attendance and supervision of hyperbaric oxygen therapy, per session 1 ICD-10 Diagnosis Description L59.8 Other specified disorders of the skin and subcutaneous tissue related to radiation Physician Procedures Quantity CPT4 Code Description Modifier [id number] - WC PHYS HYPERBARIC OXYGEN THERAPY 1 ICD-10 Diagnosis Description L59.8 Other specified disorders of the skin and subcutaneous tissue related to radiation Electronic Signature(s) Signed: 02/08/2020 5:13:04 PM By: [name] EMT/HBOT/SD Signed: 02/08/2020 5:29:29 PM By: Linton Ham MD Entered By: [name] on 02/08/2020 17:12:17

## 2020-02-09 ENCOUNTER — Encounter (HOSPITAL_BASED_OUTPATIENT_CLINIC_OR_DEPARTMENT_OTHER): Payer: Medicaid Other | Admitting: Internal Medicine

## 2020-02-09 DIAGNOSIS — M272 Inflammatory conditions of jaws: Secondary | ICD-10-CM | POA: Diagnosis not present

## 2020-02-09 DIAGNOSIS — Z8589 Personal history of malignant neoplasm of other organs and systems: Secondary | ICD-10-CM | POA: Diagnosis not present

## 2020-02-09 DIAGNOSIS — L598 Other specified disorders of the skin and subcutaneous tissue related to radiation: Secondary | ICD-10-CM | POA: Diagnosis not present

## 2020-02-09 NOTE — Progress Notes (Addendum)
AVANT, PRINTY ([id number]) Visit Report for 02/09/2020 HBO Details Patient Name: Date of Service: [name] [name], [name] [name] 02/09/2020 3:00 PM Medical Record Number: [id number] Patient Account Number: [id number] Date of Birth/Sex: Treating RN: 01-May-1961 (59 y.o. 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 HBO Treatment Details Treatment Number: 21 Patient Type: Outpatient Chamber Type: Monoplace Chamber Serial #: [id number] Treatment Protocol: 2.5 ATA with 90 minutes oxygen, with two 5 minute air breaks Treatment Details Compression Rate Down: 2.0 psi / minute De-Compression Rate Up: 3.0 psi / minute A breaks and breathing ir Compress Tx Pressure periods Decompress Decompress Begins Reached (leave unused spaces Begins Ends blank) Chamber Pressure (ATA 1 2.5 2.5 2.5 2.5 2.5 - - 2.5 1 ) Clock Time (24 hr) 15:00 15:12 15:42 15:47 16:17 16:22 - - 16:52 16:58 Treatment Length: 118 (minutes) Treatment Segments: 4 Vital Signs Capillary Blood Glucose Reference Range: 80 - 120 mg / dl HBO Diabetic Blood Glucose Intervention Range: <131 mg/dl or >249 mg/dl Time Vitals Blood Respiratory Capillary Blood Glucose Pulse Action Type: Pulse: Temperature: Taken: Pressure: Rate: Glucose (mg/dl): Meter #: Oximetry (%) Taken: Pre 14:55 134/94 86 16 98.2 Post 17:00 128/87 71 14 97.9 Treatment Response Treatment Toleration: Well Treatment Completion Status: Treatment Completed without Adverse Event [name] [name] Notes No concerns with treatment given Physician HBO Attestation: I certify that I supervised this HBO treatment in accordance  with Medicare guidelines. A trained emergency response team is readily available per Yes hospital policies and procedures. Continue HBOT as ordered. Yes Electronic Signature(s) Signed: 02/09/2020 5:20:32 PM By: Linton Ham MD Previous Signature: 02/09/2020 5:03:51 PM Version By: [name] EMT/HBOT/SD Entered By: Linton Ham on 02/09/2020 17:19:21 -------------------------------------------------------------------------------- HBO Safety Checklist Details Patient Name: Date of Service: [name] [name] 02/09/2020 3:00 PM Medical Record Number: [id number] Patient Account Number: [id number] Date of Birth/Sex: Treating RN: [birth year] (59 y.o. [name] [name] Primary Care Para Cossey: [name] Other Clinician: [name] Referring Era Parr: Treating Shaquera Ansley/Extender: [name] Weeks in Treatment: 7 HBO Safety Checklist Items Safety Checklist Consent Form Signed Patient voided / foley secured and emptied When did you last eato n/a Last dose of injectable or oral agent n/a Ostomy pouch emptied and vented if applicable NA All implantable devices assessed, documented and approved NA Intravenous access site secured and place NA Valuables secured Linens and cotton and cotton/polyester blend (less than 51% polyester) Personal oil-based products / skin lotions / body lotions removed Wigs or hairpieces removed NA Smoking or tobacco materials removed Books / newspapers / magazines / loose paper removed Cologne, aftershave, perfume and deodorant removed Jewelry removed (may wrap wedding band) Make-up removed NA Hair care products removed Battery operated devices (external) removed NA Heating patches and chemical warmers removed NA Titanium eyewear removed NA Nail polish cured greater than 10 hours NA Casting material cured greater than 10 hours NA Hearing aids removed NA Loose dentures or partials removed NA Prosthetics  have been removed NA Patient demonstrates correct use of air break device (if applicable) Patient concerns have been addressed Patient grounding bracelet on and cord attached to chamber Specifics for Inpatients (complete in addition to above) Medication sheet sent with patient Intravenous medications needed or due  during therapy sent with patient Drainage tubes (e.g. nasogastric tube or chest tube secured and vented) Endotracheal or Tracheotomy tube secured Cuff deflated of air and inflated with saline Airway suctioned Electronic Signature(s) Signed: 02/09/2020 3:19:45 PM By: [name] EMT/HBOT/SD Entered By: [name] on 02/09/2020 15:19:44

## 2020-02-09 NOTE — Progress Notes (Signed)
MEHTAAB, MAYEDA ([id number]) Visit Report for 02/09/2020 Arrival Information Details Patient Name: Date of Service: [name] [name], [name] [name] 02/09/2020 3:00 PM Medical Record Number: [id number] Patient Account Number: [id number] Date of Birth/Sex: Treating RN: [birth year] (59 y.o. [name] Primary Care Keylon Labelle: [name] Other Clinician: [name] Referring Surya Schroeter: Treating Dreydon Cardenas/Extender: [name] Weeks in Treatment: 7 Visit Information History Since Last Visit Added or deleted any medications: No Patient Arrived: Ambulatory Any new allergies or adverse reactions: No Arrival Time: 14:50 Had a fall or experienced change in No Accompanied By: self activities of daily living that may affect Transfer Assistance: None risk of falls: Patient Identification Verified: Yes Signs or symptoms of abuse/neglect since last visito No Secondary Verification Process Completed: Yes Hospitalized since last visit: No Patient Requires Transmission-Based Precautions: No Implantable device outside of the clinic excluding No Patient Has Alerts: No cellular tissue based products placed in the center since last visit: Pain Present Now: No Electronic Signature(s) Signed: 02/09/2020 5:03:51 PM By: [name] EMT/HBOT/SD Entered By: [name] on 02/09/2020 15:17:29 -------------------------------------------------------------------------------- Encounter Discharge Information Details Patient Name: Date of Service: [name] Coe. 02/09/2020 3:00 PM Medical Record Number: [id number] Patient Account Number: [id number] Date of Birth/Sex: Treating RN: [birth year] (59 y.o. [name] Primary Care Tamsyn Owusu: [name] Other Clinician: [name] Referring Antonya Leeder: Treating Anagabriela Jokerst/Extender: [name] Weeks in Treatment: 7 Encounter Discharge Information Items Discharge Condition: Stable Ambulatory  Status: Ambulatory Discharge Destination: Home Transportation: Private Auto Accompanied By: self Schedule Follow-up Appointment: Yes Clinical Summary of Care: Patient Declined Electronic Signature(s) Signed: 02/09/2020 5:03:51 PM By: [name] EMT/HBOT/SD Entered By: [name] on 02/09/2020 17:03:26 -------------------------------------------------------------------------------- Patient/Caregiver Education Details Patient Name: Date of Service: [name] [name], [name] [name] 7/23/2021andnbsp3:00 PM Medical Record Number: [id number] Patient Account Number: [id number] Date of Birth/Gender: Treating RN: [birth year] (59 y.o. [name] Primary Care Physician: [name] Other Clinician: [name] Referring Physician: Treating Physician/Extender: Renard Hamper in Treatment: 7 Education Assessment Education Provided To: Patient Education Topics Provided Hyperbaric Oxygenation: Methods: Explain/Verbal Responses: State content correctly Electronic Signature(s) Signed: 02/09/2020 5:03:51 PM By: [name] EMT/HBOT/SD Entered By: [name] on 02/09/2020 17:03:13 -------------------------------------------------------------------------------- Vitals Details Patient Name: Date of Service: [name] Coe. 02/09/2020 3:00 PM Medical Record Number: [id number] Patient Account Number: [id number] Date of Birth/Sex: Treating RN: 1961/01/28 (59 y.o. [name] Primary Care Mitsuo Budnick: [name] Other Clinician: [name] Referring Shahzaib Azevedo: Treating Kable Haywood/Extender: [name] Weeks in Treatment: 7 Vital Signs Time Taken: 14:55 Temperature (F): 98.2 Height (in): 75 Pulse (bpm): 86 Weight (lbs): 190 Respiratory Rate (breaths/min): 16 Body Mass Index (BMI): 23.7 Blood Pressure (mmHg): 134/94 Reference Range: 80 - 120 mg / dl Electronic Signature(s) Signed: 02/09/2020 5:03:51 PM By:  [name] EMT/HBOT/SD Entered By: [name] on 02/09/2020 15:18:56

## 2020-02-09 NOTE — Progress Notes (Signed)
TRINTEN, BOUDOIN ([id number]) Visit Report for 01/30/2020 Physician Orders Details Patient Name: Date of Service: [name], [name] 01/30/2020 2:15 PM Medical Record Number: [id number] Patient Account Number: [id number] Date of Birth/Sex: Treating RN: [birth year] (59 y.o. [name]) [name] Primary Care Provider: [name] Other Clinician: Referring Provider: Treating Provider/Extender: [name] Weeks in Treatment: 5 Verbal / Phone Orders: No Diagnosis Coding Hyperbaric Oxygen Therapy Indication: - Soft Tissue Necrosis 2.5 ATA for 90 Minutes with 2 Five (5) Minute A Breaks ir Total Number of Treatments: - 40 One treatments per day (delivered Monday through Friday unless otherwise specified in Special Instructions below): Follow Hyperbaric Oxygen Glycemia Protocol Afrin (Oxymetazoline HCL) 0.05% nasal spray - 1 spray in both nostrils daily as needed prior to HBO treatment for difficulty clearing ears GLYCEMIA INTERVENTIONS PROTOCOL PRE-HBO GLYCEMIA INTERVENTIONS ACTION INTERVENTION Obtain pre-HBO capillary blood glucose (ensure 1 physician order is in chart). A. Notify HBO physician and await physician orders. 2 If result is 70 mg/dl or below: B. If the result meets the hospital definition of a critical result, follow hospital policy. A. Give patient an 8 ounce Glucerna Shake, an 8 ounce Ensure, or 8 ounces of a Glucerna/Ensure equivalent dietary supplement*. B. Wait 30 minutes. If result is 71 mg/dl to 130 mg/dl: C. Retest patients capillary blood glucose (CBG). D. If result greater than or equal to 110 mg/dl, proceed with HBO. If result less than 110 mg/dl, notify HBO physician and consider holding HBO. If result is 131 mg/dl to 249 mg/dl: A. Proceed with HBO. A. Notify HBO physician and await physician orders. B. It is recommended to hold HBO and do If result is 250 mg/dl or greater: blood/urine ketone testing. C. If the result meets  the hospital definition of a critical result, follow hospital policy. POST-HBO GLYCEMIA INTERVENTIONS ACTION INTERVENTION Obtain post HBO capillary blood glucose (ensure 1 physician order is in chart). A. Notify HBO physician and await physician orders. 2 If result is 70 mg/dl or below: B. If the result meets the hospital definition of a critical result, follow hospital policy. A. Give patient an 8 ounce Glucerna Shake, an 8 ounce Ensure, or 8 ounces of a Glucerna/Ensure equivalent dietary supplement*. B. Wait 15 minutes for symptoms of If result is 71 mg/dl to 100 mg/dl: hypoglycemia (i.e. nervousness, anxiety, sweating, chills, clamminess, irritability, confusion, tachycardia or dizziness). C. If patient asymptomatic, discharge patient. If patient symptomatic, repeat capillary blood glucose (CBG) and notify HBO physician. If result is 101 mg/dl to 249 mg/dl: A. Discharge patient. A. Notify HBO physician and await physician orders. B. It is recommended to do blood/urine ketone If result is 250 mg/dl or greater: testing. C. If the result meets the hospital definition of a critical result, follow hospital policy. *Juice or candies are NOT equivalent products. If patient refuses the Glucerna or Ensure, please consult the hospital dietitian for an appropriate substitute. Electronic Signature(s) Signed: 02/06/2020 6:00:01 PM By: Linton Ham MD Signed: 02/09/2020 5:43:43 PM By: [name] RN Entered By: [name] on 02/05/2020 11:13:27 -------------------------------------------------------------------------------- SuperBill Details Patient Name: Date of Service: [name] [name] 01/30/2020 Medical Record Number: [id number] Patient Account Number: [id number] Date of Birth/Sex: Treating RN: 11-Oct-1960 (59 y.o. 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

## 2020-02-09 NOTE — Progress Notes (Addendum)
[name] [name], [name] [name] ([id number]) Visit Report for 01/30/2020 Arrival Information Details Patient Name: Date of Service: [name] [name], [name] [name] 01/30/2020 2:15 PM Medical Record Number: [id number] Patient Account Number: [id number] Date of Birth/Sex: Treating RN: 1960-08-30 (59 y.o. [name]) [name] Primary Care Lamekia Nolden: [name] Other Clinician: Referring Shavell Nored: Treating Jakiya Bookbinder/Extender: [name] Weeks in Treatment: 5 Visit Information History Since Last Visit All ordered tests and consults were completed: No Patient Arrived: Ambulatory Added or deleted any medications: No Arrival Time: 11:08 Any new allergies or adverse reactions: No Accompanied By: SELF Had a fall or experienced change in No Transfer Assistance: None activities of daily living that may affect Patient Identification Verified: Yes risk of falls: Secondary Verification Process Completed: Yes Signs or symptoms of abuse/neglect since last visito No Patient Requires Transmission-Based Precautions: No Hospitalized since last visit: No Patient Has Alerts: No Implantable device outside of the clinic excluding No cellular tissue based products placed in the center since last visit: Has Dressing in Place as Prescribed: Yes Pain Present Now: No Electronic Signature(s) Signed: 02/09/2020 5:43:43 PM By: [name] RN Entered By: [name] on 02/05/2020 11:09:22 -------------------------------------------------------------------------------- Clinic Level of Care Assessment Details Patient Name: Date of Service: [name] [name], [name] [name] 01/30/2020 2:15 PM Medical Record Number: [id number] Patient Account Number: [id number] Date of Birth/Sex: Treating RN: Jun 20, 1961 (59 y.o. [name]) [name] Primary Care Elliott Quade: [name] Other Clinician: Referring Uthman Mroczkowski: Treating Yariah Selvey/Extender: Birdena Crandall, RYLEE Weeks in Treatment: 5 Clinic Level of Care Assessment Items TOOL  4 Quantity Score []  - 0 Use when only an EandM is performed on FOLLOW-UP visit ASSESSMENTS - Nursing Assessment / Reassessment X- 1 10 Reassessment of Co-morbidities (includes updates in patient status) X- 1 5 Reassessment of Adherence to Treatment Plan ASSESSMENTS - Wound and Skin A ssessment / Reassessment []  - 0 Simple Wound Assessment / Reassessment - one wound []  - 0 Complex Wound Assessment / Reassessment - multiple wounds []  - 0 Dermatologic / Skin Assessment (not related to wound area) ASSESSMENTS - Focused Assessment []  - 0 Circumferential Edema Measurements - multi extremities []  - 0 Nutritional Assessment / Counseling / Intervention []  - 0 Lower Extremity Assessment (monofilament, tuning fork, pulses) []  - 0 Peripheral Arterial Disease Assessment (using hand held doppler) ASSESSMENTS - Ostomy and/or Continence Assessment and Care []  - 0 Incontinence Assessment and Management []  - 0 Ostomy Care Assessment and Management (repouching, etc.) PROCESS - Coordination of Care X - Simple Patient / Family Education for ongoing care 1 15 []  - 0 Complex (extensive) Patient / Family Education for ongoing care X- 1 10 Staff obtains Programmer, systems, Records, T Results / Process Orders est []  - 0 Staff telephones HHA, Nursing Homes / Clarify orders / etc []  - 0 Routine Transfer to another Facility (non-emergent condition) []  - 0 Routine Hospital Admission (non-emergent condition) []  - 0 New Admissions / Biomedical engineer / Ordering NPWT Apligraf, etc. , []  - 0 Emergency Hospital Admission (emergent condition) X- 1 10 Simple Discharge Coordination []  - 0 Complex (extensive) Discharge Coordination PROCESS - Special Needs []  - 0 Pediatric / Minor Patient Management []  - 0 Isolation Patient Management []  - 0 Hearing / Language / Visual special needs []  - 0 Assessment of Community assistance (transportation, D/C planning, etc.) []  - 0 Additional assistance / Altered  mentation []  - 0 Support Surface(s) Assessment (bed, cushion, seat, etc.) INTERVENTIONS - Wound Cleansing / Measurement []  - 0 Simple Wound Cleansing -  one wound []  - 0 Complex Wound Cleansing - multiple wounds []  - 0 Wound Imaging (photographs - any number of wounds) []  - 0 Wound Tracing (instead of photographs) []  - 0 Simple Wound Measurement - one wound []  - 0 Complex Wound Measurement - multiple wounds INTERVENTIONS - Wound Dressings []  - 0 Small Wound Dressing one or multiple wounds []  - 0 Medium Wound Dressing one or multiple wounds []  - 0 Large Wound Dressing one or multiple wounds []  - 0 Application of Medications - topical []  - 0 Application of Medications - injection INTERVENTIONS - Miscellaneous []  - 0 External ear exam []  - 0 Specimen Collection (cultures, biopsies, blood, body fluids, etc.) []  - 0 Specimen(s) / Culture(s) sent or taken to Lab for analysis []  - 0 Patient Transfer (multiple staff / Civil Service fast streamer / Similar devices) []  - 0 Simple Staple / Suture removal (25 or less) []  - 0 Complex Staple / Suture removal (26 or more) []  - 0 Hypo / Hyperglycemic Management (close monitor of Blood Glucose) []  - 0 Ankle / Brachial Index (ABI) - do not check if billed separately X- 1 5 Vital Signs Has the patient been seen at the hospital within the last three years: Yes Total Score: 55 Level Of Care: New/Established - Level 2 Electronic Signature(s) Signed: 02/09/2020 5:43:43 PM By: [name] RN Entered By: [name] on 02/05/2020 11:14:29 -------------------------------------------------------------------------------- Multi-Disciplinary Care Plan Details Patient Name: Date of Service: [name] [name]. 01/30/2020 2:15 PM Medical Record Number: [id number] Patient Account Number: [id number] Date of Birth/Sex: Treating RN: 1960/11/06 (59 y.o. [name] Primary Care Safal Halderman: [name] Other Clinician: Referring Dayshawn Irizarry: Treating  Tasean Mancha/Extender: Birdena Crandall, RYLEE Weeks in Treatment: 5 Active Inactive Electronic Signature(s) Signed: 04/09/2020 7:59:16 AM By: Levan Hurst RN, BSN Signed: 04/22/2020 5:21:33 PM By: [name] RN Previous Signature: 02/09/2020 5:43:43 PM Version By: [name] RN Entered By: Levan Hurst on 04/05/2020 11:07:03 -------------------------------------------------------------------------------- Pain Assessment Details Patient Name: Date of Service: [name] [name], [name] [name] 01/30/2020 2:15 PM Medical Record Number: [id number] Patient Account Number: [id number] Date of Birth/Sex: Treating RN: Oct 21, 1960 (59 y.o. [name] Primary Care Mahagony Grieb: [name] Other Clinician: Referring Amyia Lodwick: Treating Enas Winchel/Extender: Birdena Crandall, RYLEE Weeks in Treatment: 5 Active Problems Location of Pain Severity and Description of Pain Patient Has Paino No Site Locations Pain Management and Medication Current Pain Management: Electronic Signature(s) Signed: 02/09/2020 5:43:43 PM By: [name] RN Entered By: [name] on 02/05/2020 11:11:07 -------------------------------------------------------------------------------- Patient/Caregiver Education Details Patient Name: Date of Service: [name] [name] 7/13/2021andnbsp2:15 PM Medical Record Number: [id number] Patient Account Number: [id number] Date of Birth/Gender: Treating RN: April 24, 1961 (59 y.o. [name] Primary Care Physician: [name] Other Clinician: Referring Physician: Treating Physician/Extender: [name] Weeks in Treatment: 5 Education Assessment Education Provided To: Patient Education Topics Provided Tissue Oxygenation: Methods: Explain/Verbal Responses: State content correctly Electronic Signature(s) Signed: 02/09/2020 5:43:43 PM By: [name] RN Entered By: [name] on 02/05/2020  11:12:35 -------------------------------------------------------------------------------- Vitals Details Patient Name: Date of Service: [name] [name]. 01/30/2020 2:15 PM Medical Record Number: [id number] Patient Account Number: [id number] Date of Birth/Sex: Treating RN: [birth year] (59 y.o. [name] Primary Care Analei Whinery: [name] Other Clinician: Referring Jaquese Irving: Treating Shoshanna Mcquitty/Extender: Birdena Crandall, RYLEE Weeks in Treatment: 5 Vital Signs Time Taken: 14:55 Temperature (F): 98.3 Height (in): 75 Pulse (bpm): 83 Weight (lbs): 190 Respiratory Rate (breaths/min):  14 Body Mass Index (BMI): 23.7 Blood Pressure (mmHg): 99/76 Reference Range: 80 - 120 mg / dl Electronic Signature(s) Signed: 02/09/2020 5:43:43 PM By: [name] RN Entered By: [name] on 02/05/2020 11:11:00

## 2020-02-09 NOTE — Progress Notes (Signed)
DEAVIN, FORST ([id number]) Visit Report for 02/09/2020 SuperBill Details Patient Name: Date of Service: [name] [name], [name] [name] 02/09/2020 Medical Record Number: [id number] Patient Account Number: [id number] Date of Birth/Sex: Treating RN: February 25, 1961 (59 y.o. 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, unspecified Facility Procedures CPT4 Code Description Modifier Quantity [id number] 99183-Physician attendance and supervision of hyperbaric oxygen therapy, per session 1 ICD-10 Diagnosis Description L59.8 Other specified disorders of the skin and subcutaneous tissue related to radiation Physician Procedures Quantity CPT4 Code Description Modifier [id number] 30131 - WC PHYS HYPERBARIC OXYGEN THERAPY 1 ICD-10 Diagnosis Description L59.8 Other specified disorders of the skin and subcutaneous tissue related to radiation Electronic Signature(s) Signed: 02/09/2020 5:03:51 PM By: [name] EMT/HBOT/SD Signed: 02/09/2020 5:20:32 PM By: Linton Ham MD Entered By: [name] on 02/09/2020 17:03:00

## 2020-02-12 ENCOUNTER — Encounter (HOSPITAL_BASED_OUTPATIENT_CLINIC_OR_DEPARTMENT_OTHER): Payer: Medicaid Other | Admitting: Internal Medicine

## 2020-02-12 DIAGNOSIS — L598 Other specified disorders of the skin and subcutaneous tissue related to radiation: Secondary | ICD-10-CM | POA: Diagnosis not present

## 2020-02-12 DIAGNOSIS — Z8589 Personal history of malignant neoplasm of other organs and systems: Secondary | ICD-10-CM | POA: Diagnosis not present

## 2020-02-12 DIAGNOSIS — M272 Inflammatory conditions of jaws: Secondary | ICD-10-CM | POA: Diagnosis not present

## 2020-02-12 NOTE — Progress Notes (Signed)
[name] [name], [name] [name] ([id number]) Visit Report for 02/12/2020 Arrival Information Details Patient Name: Date of Service: [name] [name], [name] [name] 02/12/2020 3:00 PM Medical Record Number: [id number] Patient Account Number: [id number] Date of Birth/Sex: Treating RN: 06-25-1961 (59 y.o. [name] Primary Care Kerney Hopfensperger: [name] Other Clinician: [name] Referring Jonnae Fonseca: Treating Denecia Brunette/Extender: [name] Weeks in Treatment: 7 Visit Information History Since Last Visit Added or deleted any medications: No Patient Arrived: Ambulatory Any new allergies or adverse reactions: No Arrival Time: 14:55 Had a fall or experienced change in No Accompanied By: self activities of daily living that may affect Transfer Assistance: None risk of falls: Patient Identification Verified: Yes Signs or symptoms of abuse/neglect since last visito No Secondary Verification Process Completed: Yes Hospitalized since last visit: No Patient Requires Transmission-Based Precautions: No Implantable device outside of the clinic excluding No Patient Has Alerts: No cellular tissue based products placed in the center since last visit: Pain Present Now: No Electronic Signature(s) Signed: 02/12/2020 5:18:56 PM By: [name] EMT/HBOT/SD Entered By: [name] on 02/12/2020 15:21:54 -------------------------------------------------------------------------------- Encounter Discharge Information Details Patient Name: Date of Service: [name] Coe. 02/12/2020 3:00 PM Medical Record Number: [id number] Patient Account Number: [id number] Date of Birth/Sex: Treating RN: February 24, 1961 (59 y.o. [name] Primary Care Cassi Jenne: [name] Other Clinician: [name] Referring Militza Devery: Treating Romualdo Prosise/Extender: [name] Weeks in Treatment: 7 Encounter Discharge Information Items Discharge Condition: Stable Ambulatory Status:  Ambulatory Discharge Destination: Home Transportation: Private Auto Accompanied By: self Schedule Follow-up Appointment: Yes Clinical Summary of Care: Patient Declined Electronic Signature(s) Signed: 02/12/2020 5:18:56 PM By: [name] EMT/HBOT/SD Entered By: [name] on 02/12/2020 17:18:29 -------------------------------------------------------------------------------- Patient/Caregiver Education Details Patient Name: Date of Service: [name] [name], [name] [name] 7/26/2021andnbsp3:00 PM Medical Record Number: [id number] Patient Account Number: [id number] Date of Birth/Gender: Treating RN: [birth year] (59 y.o. [name] Primary Care Physician: [name] Other Clinician: [name] Referring Physician: Treating Physician/Extender: Renard Hamper in Treatment: 7 Education Assessment Education Provided To: Patient Education Topics Provided Hyperbaric Oxygenation: Methods: Explain/Verbal Responses: State content correctly Electronic Signature(s) Signed: 02/12/2020 5:18:56 PM By: [name] EMT/HBOT/SD Entered By: [name] on 02/12/2020 17:18:18 -------------------------------------------------------------------------------- Vitals Details Patient Name: Date of Service: [name] Coe. 02/12/2020 3:00 PM Medical Record Number: [id number] Patient Account Number: [id number] Date of Birth/Sex: Treating RN: 09-19-1960 (59 y.o. [name] Primary Care Santosha [name]: [name] Other Clinician: [name] Referring Junie Avilla: Treating Yuuki Skeens/Extender: [name] Weeks in Treatment: 7 Vital Signs Time Taken: 15:00 Temperature (F): 98.2 Height (in): 75 Pulse (bpm): 64 Weight (lbs): 190 Respiratory Rate (breaths/min): 13 Body Mass Index (BMI): 23.7 Blood Pressure (mmHg): 144/84 Reference Range: 80 - 120 mg / dl Electronic Signature(s) Signed: 02/12/2020 5:18:56 PM By: [name]  EMT/HBOT/SD Entered By: [name] on 02/12/2020 15:23:41

## 2020-02-12 NOTE — Progress Notes (Signed)
MANDO, BLATZ ([id number]) Visit Report for 02/12/2020 SuperBill Details Patient Name: Date of Service: MARISA, HUFSTETLER 02/12/2020 Medical Record Number: [id number] Patient Account Number: [id number] Date of Birth/Sex: Treating RN: [birth year] (59 y.o. 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, unspecified Facility Procedures CPT4 Code Description Modifier Quantity [id number] 99183-Physician attendance and supervision of hyperbaric oxygen therapy, per session 1 ICD-10 Diagnosis Description L59.8 Other specified disorders of the skin and subcutaneous tissue related to radiation Physician Procedures Quantity CPT4 Code Description Modifier [id number] 16967 - WC PHYS HYPERBARIC OXYGEN THERAPY 1 ICD-10 Diagnosis Description L59.8 Other specified disorders of the skin and subcutaneous tissue related to radiation Electronic Signature(s) Signed: 02/12/2020 5:18:56 PM By: [name] EMT/HBOT/SD Signed: 02/12/2020 6:00:25 PM By: Linton Ham MD Entered By: [name] on 02/12/2020 17:18:02

## 2020-02-12 NOTE — Progress Notes (Addendum)
[name], [name] ([id number]) Visit Report for 02/12/2020 HBO Details Patient Name: Date of Service: [name] [name], [name] [name] 02/12/2020 3:00 PM Medical Record Number: [id number] Patient Account Number: [id number] Date of Birth/Sex: Treating RN: 05-09-1961 (59 y.o. 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 HBO Treatment Details Treatment Number: 22 Patient Type: Outpatient Chamber Type: Monoplace Chamber Serial #: [id number] Treatment Protocol: 2.5 ATA with 90 minutes oxygen, with two 5 minute air breaks Treatment Details Compression Rate Down: 2.0 psi / minute De-Compression Rate Up: 3.0 psi / minute A breaks and breathing ir Compress Tx Pressure periods Decompress Decompress Begins Reached (leave unused spaces Begins Ends blank) Chamber Pressure (ATA 1 2.5 2.5 2.5 2.5 2.5 - - 2.5 1 ) Clock Time (24 hr) 15:05 15:17 15:47 15:52 16:22 16:27 - - 16:57 17:03 Treatment Length: 118 (minutes) Treatment Segments: 4 Vital Signs Capillary Blood Glucose Reference Range: 80 - 120 mg / dl HBO Diabetic Blood Glucose Intervention Range: <131 mg/dl or >249 mg/dl Time Vitals Blood Respiratory Capillary Blood Glucose Pulse Action Type: Pulse: Temperature: Taken: Pressure: Rate: Glucose (mg/dl): Meter #: Oximetry (%) Taken: Pre 15:00 144/84 64 13 98.2 Post 17:05 114/89 77 15 97.8 Treatment Response Treatment Toleration: Well Treatment Completion Status: Treatment Completed without Adverse Event [name] Notes No concerns with treatment given Physician HBO Attestation: I certify that I supervised this HBO treatment in accordance with  Medicare guidelines. A trained emergency response team is readily available per Yes hospital policies and procedures. Continue HBOT as ordered. Yes Electronic Signature(s) Signed: 02/12/2020 6:00:25 PM By: Linton Ham MD Previous Signature: 02/12/2020 5:18:56 PM Version By: [name] EMT/HBOT/SD Entered By: Linton Ham on 02/12/2020 17:59:01 -------------------------------------------------------------------------------- HBO Safety Checklist Details Patient Name: Date of Service: [name] [name], [name] [name] 02/12/2020 3:00 PM Medical Record Number: [id number] Patient Account Number: [id number] Date of Birth/Sex: Treating RN: [birth year] (59 y.o. [name] Primary Care Piers Baade: [name] Other Clinician: [name] Referring Patina Spanier: Treating Aaliyah Cancro/Extender: [name] Weeks in Treatment: 7 HBO Safety Checklist Items Safety Checklist Consent Form Signed Patient voided / foley secured and emptied When did you last eato n/a Last dose of injectable or oral agent n/a Ostomy pouch emptied and vented if applicable NA All implantable devices assessed, documented and approved NA Intravenous access site secured and place NA Valuables secured Linens and cotton and cotton/polyester blend (less than 51% polyester) Personal oil-based products / skin lotions / body lotions removed Wigs or hairpieces removed NA Smoking or tobacco materials removed Books / newspapers / magazines / loose paper removed Cologne, aftershave, perfume and deodorant removed Jewelry removed (may wrap wedding band) Make-up removed NA Hair care products removed Battery operated devices (external) removed NA Heating patches and chemical warmers removed NA Titanium eyewear removed NA Nail polish cured greater than 10 hours NA Casting material cured greater than 10 hours NA Hearing aids removed NA Loose dentures or partials removed NA Prosthetics have been  removed NA Patient demonstrates correct use of air break device (if applicable) Patient concerns have been addressed Patient grounding bracelet on and cord attached to chamber Specifics for Inpatients (complete in addition to above) Medication sheet sent with patient Intravenous medications needed or due  during therapy sent with patient Drainage tubes (e.g. nasogastric tube or chest tube secured and vented) Endotracheal or Tracheotomy tube secured Cuff deflated of air and inflated with saline Airway suctioned Electronic Signature(s) Signed: 02/12/2020 3:24:23 PM By: [name] EMT/HBOT/SD Entered By: [name] on 02/12/2020 15:24:22

## 2020-02-13 ENCOUNTER — Encounter (HOSPITAL_BASED_OUTPATIENT_CLINIC_OR_DEPARTMENT_OTHER): Payer: Medicaid Other | Admitting: Internal Medicine

## 2020-02-13 DIAGNOSIS — L598 Other specified disorders of the skin and subcutaneous tissue related to radiation: Secondary | ICD-10-CM | POA: Diagnosis not present

## 2020-02-13 DIAGNOSIS — M272 Inflammatory conditions of jaws: Secondary | ICD-10-CM | POA: Diagnosis not present

## 2020-02-13 DIAGNOSIS — Z8589 Personal history of malignant neoplasm of other organs and systems: Secondary | ICD-10-CM | POA: Diagnosis not present

## 2020-02-13 NOTE — Progress Notes (Signed)
AMELIO, BROSKY ([id number]) Visit Report for 02/13/2020 Arrival Information Details Patient Name: Date of Service: [name] [name], [name] [name] 02/13/2020 3:00 PM Medical Record Number: [id number] Patient Account Number: [id number] Date of Birth/Sex: Treating RN: 1961/06/28 (59 y.o. [name]) [name] Primary Care Klark Vanderhoef: [name] Other Clinician: [name] Referring Dmya Long: Treating Lenard Kampf/Extender: [name] Weeks in Treatment: 7 Visit Information History Since Last Visit Added or deleted any medications: No Patient Arrived: Ambulatory Any new allergies or adverse reactions: No Arrival Time: 14:55 Had a fall or experienced change in No Accompanied By: self activities of daily living that may affect Transfer Assistance: None risk of falls: Patient Identification Verified: Yes Signs or symptoms of abuse/neglect since last visito No Secondary Verification Process Completed: Yes Hospitalized since last visit: No Patient Requires Transmission-Based Precautions: No Implantable device outside of the clinic excluding No Patient Has Alerts: No cellular tissue based products placed in the center since last visit: Pain Present Now: No Electronic Signature(s) Signed: 02/13/2020 5:30:44 PM By: [name] EMT/HBOT/SD Entered By: [name] on 02/13/2020 15:12:15 -------------------------------------------------------------------------------- Encounter Discharge Information Details Patient Name: Date of Service: [name] 02/13/2020 3:00 PM Medical Record Number: [id number] Patient Account Number: [id number] Date of Birth/Sex: Treating RN: 12/23/1960 (59 y.o. [name] Primary Care Khyran Riera: [name] Other Clinician: [name] Referring Tamirra Sienkiewicz: Treating Elanor Cale/Extender: [name] Weeks in Treatment: 7 Encounter Discharge Information Items Discharge Condition: Stable Ambulatory Status:  Ambulatory Discharge Destination: Home Transportation: Private Auto Accompanied By: self Schedule Follow-up Appointment: Yes Clinical Summary of Care: Patient Declined Electronic Signature(s) Signed: 02/13/2020 5:30:44 PM By: [name] EMT/HBOT/SD Entered By: [name] on 02/13/2020 17:30:25 -------------------------------------------------------------------------------- Patient/Caregiver Education Details Patient Name: Date of Service: DESMUND, ELMAN 7/27/2021andnbsp3:00 PM Medical Record Number: [id number] Patient Account Number: [id number] Date of Birth/Gender: Treating RN: 1961-07-07 (59 y.o. [name] Primary Care Physician: [name] Other Clinician: [name] Referring Physician: Treating Physician/Extender: [name] Weeks in Treatment: 7 Education Assessment Education Provided To: Patient Education Topics Provided Hyperbaric Oxygenation: Methods: Explain/Verbal Responses: State content correctly Electronic Signature(s) Signed: 02/13/2020 5:30:44 PM By: [name] EMT/HBOT/SD Entered By: [name] on 02/13/2020 17:30:13 -------------------------------------------------------------------------------- Vitals Details Patient Name: Date of Service: [name] 02/13/2020 3:00 PM Medical Record Number: [id number] Patient Account Number: [id number] Date of Birth/Sex: Treating RN: 1961/02/27 (59 y.o. [name] Primary Care Raymond Bhardwaj: [name] Other Clinician: [name] Referring Marvene Strohm: Treating Lochlyn Zullo/Extender: Birdena Crandall, RYLEE Weeks in Treatment: 7 Vital Signs Time Taken: 15:00 Temperature (F): 98.4 Height (in): 75 Pulse (bpm): 98 Weight (lbs): 190 Respiratory Rate (breaths/min): 14 Body Mass Index (BMI): 23.7 Blood Pressure (mmHg): 110/75 Reference Range: 80 - 120 mg / dl Electronic Signature(s) Signed: 02/13/2020 5:30:44 PM By: [name]  EMT/HBOT/SD Entered By: [name] on 02/13/2020 15:12:29

## 2020-02-13 NOTE — Progress Notes (Addendum)
TAHJ, NJOKU ([id number]) Visit Report for 02/13/2020 HBO Details Patient Name: Date of Service: [name] [name], [name] [name] 02/13/2020 3:00 PM Medical Record Number: [id number] Patient Account Number: [id number] Date of Birth/Sex: Treating RN: [birth year] (59 y.o. 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 HBO Treatment Details Treatment Number: 23 Patient Type: Outpatient Chamber Type: Monoplace Chamber Serial #: [id number] Treatment Protocol: 2.5 ATA with 90 minutes oxygen, with two 5 minute air breaks Treatment Details Compression Rate Down: 2.0 psi / minute De-Compression Rate Up: 3.0 psi / minute A breaks and breathing ir Compress Tx Pressure periods Decompress Decompress Begins Reached (leave unused spaces Begins Ends blank) Chamber Pressure (ATA 1 2.5 2.5 2.5 2.5 2.5 - - 2.5 1 ) Clock Time (24 hr) 15:02 15:14 15:44 15:49 16:19 16:24 - - 16:54 17:00 Treatment Length: 118 (minutes) Treatment Segments: 4 Vital Signs Capillary Blood Glucose Reference Range: 80 - 120 mg / dl HBO Diabetic Blood Glucose Intervention Range: <131 mg/dl or >249 mg/dl Time Vitals Blood Respiratory Capillary Blood Glucose Pulse Action Type: Pulse: Temperature: Taken: Pressure: Rate: Glucose (mg/dl): Meter #: Oximetry (%) Taken: Pre 15:00 110/75 98 14 98.4 Post 17:02 126/92 93 13 98.6 Treatment Response Treatment Toleration: Well Treatment Completion Status: Treatment Completed without Adverse Event Tyliah Schlereth Notes no concerns with treatment given Physician HBO Attestation: I certify that I supervised this HBO treatment in accordance with  Medicare guidelines. A trained emergency response team is readily available per Yes hospital policies and procedures. Continue HBOT as ordered. Yes Electronic Signature(s) Signed: 02/14/2020 3:52:22 AM By: Linton Ham MD Previous Signature: 02/13/2020 5:30:44 PM Version By: [name] EMT/HBOT/SD Entered By: Linton Ham on 02/14/2020 03:50:55 -------------------------------------------------------------------------------- HBO Safety Checklist Details Patient Name: Date of Service: [name] Coe. 02/13/2020 3:00 PM Medical Record Number: [id number] Patient Account Number: [id number] Date of Birth/Sex: Treating RN: July 03, 1961 (59 y.o. [name]) [name] Primary Care Charnise Lovan: [name] Other Clinician: [name] Referring Morna Flud: Treating Guilianna Mckoy/Extender: [name] Weeks in Treatment: 7 HBO Safety Checklist Items Safety Checklist Consent Form Signed Patient voided / foley secured and emptied When did you last eato n/a Last dose of injectable or oral agent n/a Ostomy pouch emptied and vented if applicable NA All implantable devices assessed, documented and approved NA Intravenous access site secured and place NA Valuables secured Linens and cotton and cotton/polyester blend (less than 51% polyester) Personal oil-based products / skin lotions / body lotions removed Wigs or hairpieces removed NA Smoking or tobacco materials removed Books / newspapers / magazines / loose paper removed Cologne, aftershave, perfume and deodorant removed Jewelry removed (may wrap wedding band) Make-up removed NA Hair care products removed Battery operated devices (external) removed NA Heating patches and chemical warmers removed NA Titanium eyewear removed NA Nail polish cured greater than 10 hours NA Casting material cured greater than 10 hours NA Hearing aids removed NA Loose dentures or partials removed NA Prosthetics have been  removed NA Patient demonstrates correct use of air break device (if applicable) Patient concerns have been addressed Patient grounding bracelet on and cord attached to chamber Specifics for Inpatients (complete in addition to above) Medication sheet sent with patient Intravenous medications needed or due  during therapy sent with patient Drainage tubes (e.g. nasogastric tube or chest tube secured and vented) Endotracheal or Tracheotomy tube secured Cuff deflated of air and inflated with saline Airway suctioned Electronic Signature(s) Signed: 02/13/2020 3:13:07 PM By: [name] EMT/HBOT/SD Entered By: [name] on 02/13/2020 15:13:06

## 2020-02-14 ENCOUNTER — Encounter (HOSPITAL_BASED_OUTPATIENT_CLINIC_OR_DEPARTMENT_OTHER): Payer: Medicaid Other | Admitting: Physician Assistant

## 2020-02-14 DIAGNOSIS — L598 Other specified disorders of the skin and subcutaneous tissue related to radiation: Secondary | ICD-10-CM | POA: Diagnosis not present

## 2020-02-14 DIAGNOSIS — Z8589 Personal history of malignant neoplasm of other organs and systems: Secondary | ICD-10-CM | POA: Diagnosis not present

## 2020-02-14 DIAGNOSIS — M272 Inflammatory conditions of jaws: Secondary | ICD-10-CM | POA: Diagnosis not present

## 2020-02-14 NOTE — Progress Notes (Signed)
AHMADOU, BOLZ ([id number]) Visit Report for 02/14/2020 SuperBill Details Patient Name: Date of Service: MATAN, STEEN 02/14/2020 Medical Record Number: [id number] Patient Account Number: [id number] Date of Birth/Sex: Treating RN: [birth year] (59 y.o. 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

## 2020-02-14 NOTE — Progress Notes (Signed)
ARRIE, ZUERCHER ([id number]) Visit Report for 02/13/2020 SuperBill Details Patient Name: Date of Service: [name] [name], [name] [name] 02/13/2020 Medical Record Number: [id number] Patient Account Number: [id number] Date of Birth/Sex: Treating RN: Mar 04, 1961 (59 y.o. 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

## 2020-02-14 NOTE — Progress Notes (Signed)
ABDOU, STOCKS ([id number]) Visit Report for 02/14/2020 Arrival Information Details Patient Name: Date of Service: CABLE, FEARN 02/14/2020 3:00 PM Medical Record Number: [id number] Patient Account Number: [id number] Date of Birth/Sex: Treating RN: 24-Sep-1960 (59 y.o. [name] Primary Care Brax Walen: [name] Other Clinician: [name] Referring Aiyanah Kalama: Treating Laurene Melendrez/Extender: [name] Weeks in Treatment: 7 Visit Information History Since Last Visit Added or deleted any medications: No Patient Arrived: Ambulatory Any new allergies or adverse reactions: No Arrival Time: 14:55 Had a fall or experienced change in No Accompanied By: self activities of daily living that may affect Transfer Assistance: None risk of falls: Patient Identification Verified: Yes Signs or symptoms of abuse/neglect since last visito No Secondary Verification Process Completed: Yes Hospitalized since last visit: No Patient Requires Transmission-Based Precautions: No Implantable device outside of the clinic excluding No Patient Has Alerts: No cellular tissue based products placed in the center since last visit: Pain Present Now: No Electronic Signature(s) Signed: 02/14/2020 5:12:26 PM By: [name] EMT/HBOT/SD Entered By: [name] on 02/14/2020 14:59:06 -------------------------------------------------------------------------------- Encounter Discharge Information Details Patient Name: Date of Service: [name] 02/14/2020 3:00 PM Medical Record Number: [id number] Patient Account Number: [id number] Date of Birth/Sex: Treating RN: 07-Jul-1961 (59 y.o. [name] Primary Care Syeda Prickett: [name] Other Clinician: [name] Referring Zay Yeargan: Treating Tipton Ballow/Extender: [name] Weeks in Treatment: 7 Encounter Discharge Information Items Discharge Condition: Stable Ambulatory Status:  Ambulatory Discharge Destination: Home Transportation: Private Auto Accompanied By: self Schedule Follow-up Appointment: Yes Clinical Summary of Care: Patient Declined Electronic Signature(s) Signed: 02/14/2020 5:12:26 PM By: [name] EMT/HBOT/SD Entered By: [name] on 02/14/2020 17:12:01 -------------------------------------------------------------------------------- Patient/Caregiver Education Details Patient Name: Date of Service: OLSON, LUCARELLI 7/28/2021andnbsp3:00 PM Medical Record Number: [id number] Patient Account Number: [id number] Date of Birth/Gender: Treating RN: 1961-03-01 (59 y.o. [name] Primary Care Physician: [name] Other Clinician: [name] Referring Physician: Treating Physician/Extender: [name] Weeks in Treatment: 7 Education Assessment Education Provided To: Patient Education Topics Provided Hyperbaric Oxygenation: Methods: Explain/Verbal Responses: State content correctly Electronic Signature(s) Signed: 02/14/2020 5:12:26 PM By: [name] EMT/HBOT/SD Entered By: [name] on 02/14/2020 17:11:49 -------------------------------------------------------------------------------- Vitals Details Patient Name: Date of Service: [name] 02/14/2020 3:00 PM Medical Record Number: [id number] Patient Account Number: [id number] Date of Birth/Sex: Treating RN: [birth year] (59 y.o. [name] Primary Care Forrest Jaroszewski: [name] Other Clinician: [name] Referring Danitra Payano: Treating Eilene Voigt/Extender: Eloy End, RYLEE Weeks in Treatment: 7 Vital Signs Time Taken: 15:00 Temperature (F): 98.3 Height (in): 75 Pulse (bpm): 54 Weight (lbs): 190 Respiratory Rate (breaths/min): 14 Body Mass Index (BMI): 23.7 Blood Pressure (mmHg): 118/84 Reference Range: 80 - 120 mg / dl Electronic Signature(s) Signed: 02/14/2020 5:12:26 PM By: [name] EMT/HBOT/SD Entered By: [name] on 02/14/2020 14:59:29

## 2020-02-14 NOTE — Progress Notes (Addendum)
[name], [name] ([id number]) Visit Report for 02/14/2020 HBO Details Patient Name: Date of Service: [name] [name], [name] [name] 02/14/2020 3:00 PM Medical Record Number: [id number] Patient Account Number: [id number] Date of Birth/Sex: Treating RN: Oct 27, 1960 (59 y.o. 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 HBO Treatment Details Treatment Number: 24 Patient Type: Outpatient Chamber Type: Monoplace Chamber Serial #: [id number] Treatment Protocol: 2.5 ATA with 90 minutes oxygen, with two 5 minute air breaks Treatment Details Compression Rate Down: 2.0 psi / minute De-Compression Rate Up: 3.0 psi / minute A breaks and breathing ir Compress Tx Pressure periods Decompress Decompress Begins Reached (leave unused spaces Begins Ends blank) Chamber Pressure (ATA 1 2.5 2.5 2.5 2.5 2.5 - - 2.5 1 ) Clock Time (24 hr) 15:03 15:15 15:45 15:50 16:20 16:25 - - 16:55 17:01 Treatment Length: 118 (minutes) Treatment Segments: 4 Vital Signs Capillary Blood Glucose Reference Range: 80 - 120 mg / dl HBO Diabetic Blood Glucose Intervention Range: <131 mg/dl or >249 mg/dl Time Vitals Blood Respiratory Capillary Blood Glucose Pulse Action Type: Pulse: Temperature: Taken: Pressure: Rate: Glucose (mg/dl): Meter #: Oximetry (%) Taken: Pre 15:00 118/84 54 14 98.3 Post 17:03 132/87 77 14 98.1 Treatment Response Treatment Toleration: Well Treatment Completion Status: Treatment Completed without Adverse Event Electronic Signature(s) Signed: 02/14/2020 5:12:26 PM By: [name] EMT/HBOT/SD Signed: 02/14/2020 6:41:43 PM By: [name]  PA-C Entered By: [name] on 02/14/2020 17:11:25 -------------------------------------------------------------------------------- HBO Safety Checklist Details Patient Name: Date of Service: [name] [name]. 02/14/2020 3:00 PM Medical Record Number: [id number] Patient Account Number: [id number] Date of Birth/Sex: Treating RN: 12-27-1960 (59 y.o. [name] Primary Care Domanique Luckett: [name] Other Clinician: [name] Referring [name]: Treating [name]: [name] Weeks in Treatment: 7 HBO Safety Checklist Items Safety Checklist Consent Form Signed Patient voided / foley secured and emptied When did you last eato n/a Last dose of injectable or oral agent n/a Ostomy pouch emptied and vented if applicable NA All implantable devices assessed, documented and approved NA Intravenous access site secured and place NA Valuables secured Linens and cotton and cotton/polyester blend (less than 51% polyester) Personal oil-based products / skin lotions / body lotions removed Wigs or hairpieces removed NA Smoking or tobacco materials removed NA Books / newspapers / magazines / loose paper removed Cologne, aftershave, perfume and deodorant removed Jewelry removed (may wrap wedding band) Make-up removed NA Hair care products removed Battery operated devices (external) removed NA Heating patches and chemical warmers removed NA Titanium eyewear removed NA Nail polish cured greater than 10 hours NA Casting material cured greater than 10 hours NA Hearing aids removed NA Loose dentures or partials removed NA Prosthetics have been removed NA Patient demonstrates correct use of air break device (if applicable) Patient concerns have been addressed Patient grounding bracelet on and cord attached to chamber Specifics for Inpatients (complete in addition to above) Medication sheet sent with patient Intravenous medications  needed or due during therapy sent with patient Drainage tubes (e.g. nasogastric tube or chest tube secured and vented) Endotracheal or Tracheotomy tube secured Cuff deflated of air and inflated with saline Airway suctioned Electronic Signature(s) Signed: 02/14/2020 3:00:08 PM By: [name]  EMT/HBOT/SD Entered By: [name] on 02/14/2020 15:00:07

## 2020-02-15 ENCOUNTER — Encounter (HOSPITAL_BASED_OUTPATIENT_CLINIC_OR_DEPARTMENT_OTHER): Payer: Medicaid Other | Admitting: Internal Medicine

## 2020-02-16 ENCOUNTER — Encounter (HOSPITAL_BASED_OUTPATIENT_CLINIC_OR_DEPARTMENT_OTHER): Payer: Medicaid Other | Admitting: Physician Assistant

## 2020-02-16 DIAGNOSIS — L598 Other specified disorders of the skin and subcutaneous tissue related to radiation: Secondary | ICD-10-CM | POA: Diagnosis not present

## 2020-02-16 DIAGNOSIS — Z8589 Personal history of malignant neoplasm of other organs and systems: Secondary | ICD-10-CM | POA: Diagnosis not present

## 2020-02-16 DIAGNOSIS — M272 Inflammatory conditions of jaws: Secondary | ICD-10-CM | POA: Diagnosis not present

## 2020-02-16 NOTE — Progress Notes (Signed)
[name] [name], [name] [name] ([id number]) Visit Report for 02/16/2020 SuperBill Details Patient Name: Date of Service: [name] [name], [name] [name] 02/16/2020 Medical Record Number: [id number] Patient Account Number: [id number] Date of Birth/Sex: Treating RN: May 25, 1961 (59 y.o. 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

## 2020-02-18 DIAGNOSIS — C024 Malignant neoplasm of lingual tonsil: Secondary | ICD-10-CM | POA: Diagnosis not present

## 2020-02-19 DIAGNOSIS — Z85819 Personal history of malignant neoplasm of unspecified site of lip, oral cavity, and pharynx: Secondary | ICD-10-CM | POA: Diagnosis not present

## 2020-02-21 ENCOUNTER — Encounter (HOSPITAL_BASED_OUTPATIENT_CLINIC_OR_DEPARTMENT_OTHER): Payer: Medicaid Other | Attending: Physician Assistant | Admitting: Physician Assistant

## 2020-02-21 DIAGNOSIS — L598 Other specified disorders of the skin and subcutaneous tissue related to radiation: Secondary | ICD-10-CM | POA: Insufficient documentation

## 2020-02-21 DIAGNOSIS — C099 Malignant neoplasm of tonsil, unspecified: Secondary | ICD-10-CM | POA: Insufficient documentation

## 2020-02-21 DIAGNOSIS — M272 Inflammatory conditions of jaws: Secondary | ICD-10-CM | POA: Insufficient documentation

## 2020-02-22 ENCOUNTER — Encounter (HOSPITAL_BASED_OUTPATIENT_CLINIC_OR_DEPARTMENT_OTHER): Payer: Medicaid Other | Admitting: Physician Assistant

## 2020-02-22 DIAGNOSIS — C099 Malignant neoplasm of tonsil, unspecified: Secondary | ICD-10-CM | POA: Diagnosis not present

## 2020-02-22 DIAGNOSIS — L598 Other specified disorders of the skin and subcutaneous tissue related to radiation: Secondary | ICD-10-CM | POA: Diagnosis present

## 2020-02-22 DIAGNOSIS — M272 Inflammatory conditions of jaws: Secondary | ICD-10-CM | POA: Diagnosis not present

## 2020-02-22 NOTE — Progress Notes (Signed)
[name] [name], [name] [name] ([id number]) Visit Report for 02/22/2020 SuperBill Details Patient Name: Date of Service: [name] [name], [name] [name] 02/22/2020 Medical Record Number: [id number] Patient Account Number: [id number] Date of Birth/Sex: Treating RN: 05/11/1961 (59 y.o. 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

## 2020-02-23 ENCOUNTER — Encounter (HOSPITAL_BASED_OUTPATIENT_CLINIC_OR_DEPARTMENT_OTHER): Payer: Medicaid Other | Admitting: Physician Assistant

## 2020-02-23 DIAGNOSIS — L598 Other specified disorders of the skin and subcutaneous tissue related to radiation: Secondary | ICD-10-CM | POA: Diagnosis not present

## 2020-02-23 DIAGNOSIS — M272 Inflammatory conditions of jaws: Secondary | ICD-10-CM | POA: Diagnosis not present

## 2020-02-23 NOTE — Progress Notes (Signed)
TYRAN, HUSER ([id number]) Visit Report for 02/22/2020 HBO Details Patient Name: Date of Service: [name] [name], [name] [name] 02/22/2020 3:00 PM Medical Record Number: [id number] Patient Account Number: [id number] Date of Birth/Sex: Treating RN: 15-May-1961 (59 y.o. 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 HBO Treatment Details Treatment Number: 26 Patient Type: Outpatient Chamber Type: Monoplace Chamber Serial #: [id number] Treatment Protocol: 2.5 ATA with 90 minutes oxygen, with two 5 minute air breaks Treatment Details Compression Rate Down: 2.0 psi / minute De-Compression Rate Up: 3.0 psi / minute A breaks and breathing ir Compress Tx Pressure periods Decompress Decompress Begins Reached (leave unused spaces Begins Ends blank) Chamber Pressure (ATA 1 2.5 2.5 2.5 2.5 2.5 - - 2.5 1 ) Clock Time (24 hr) 15:08 15:20 15:50 15:55 16:25 16:30 - - 17:00 17:06 Treatment Length: 118 (minutes) Treatment Segments: 4 Vital Signs Capillary Blood Glucose Reference Range: 80 - 120 mg / dl HBO Diabetic Blood Glucose Intervention Range: <131 mg/dl or >249 mg/dl Time Vitals Blood Respiratory Capillary Blood Glucose Pulse Action Type: Pulse: Temperature: Taken: Pressure: Rate: Glucose (mg/dl): Meter #: Oximetry (%) Taken: Pre 15:02 131/89 50 18 98.4 Post 17:08 131/98 62 17 98.3 Treatment Response Treatment Toleration: Well Treatment Completion Status: Treatment Completed without Adverse Event Electronic Signature(s) Signed: 02/22/2020 5:23:33 PM By: [name] EMT/HBOT/SD Signed: 02/22/2020 6:02:08 PM By: [name]  PA-C Entered By: [name] on 02/22/2020 17:22:41 -------------------------------------------------------------------------------- HBO Safety Checklist Details Patient Name: Date of Service: [name] [name], [name] [name]. 02/22/2020 3:00 PM Medical Record Number: [id number] Patient Account Number: [id number] Date of Birth/Sex: Treating RN: 1961-07-12 (59 y.o. [name] Primary Care Sharilyn Geisinger: [name] Other Clinician: [name] Referring Takoda Siedlecki: Treating Thad Osoria/Extender: [name] Weeks in Treatment: 8 HBO Safety Checklist Items Safety Checklist Consent Form Signed Patient voided / foley secured and emptied When did you last eato N/A Last dose of injectable or oral agent Ostomy pouch emptied and vented if applicable NA All implantable devices assessed, documented and approved NA Intravenous access site secured and place NA Valuables secured Linens and cotton and cotton/polyester blend (less than 51% polyester) Personal oil-based products / skin lotions / body lotions removed Wigs or hairpieces removed NA Smoking or tobacco materials removed Books / newspapers / magazines / loose paper removed Cologne, aftershave, perfume and deodorant removed Jewelry removed (may wrap wedding band) NA Make-up removed NA Hair care products removed NA Battery operated devices (external) removed NA Heating patches and chemical warmers removed NA Titanium eyewear removed NA Nail polish cured greater than 10 hours NA Casting material cured greater than 10 hours NA Hearing aids removed NA Loose dentures or partials removed NA Prosthetics have been removed NA Patient demonstrates correct use of air break device (if applicable) Patient concerns have been addressed Patient grounding bracelet on and cord attached to chamber Specifics for Inpatients (complete in addition to above) Medication sheet sent with patient Intravenous medications needed  or due during therapy sent with patient Drainage tubes (e.g. nasogastric tube or chest tube secured and vented) Endotracheal or Tracheotomy tube secured Cuff deflated of air and inflated with saline Airway suctioned Electronic Signature(s) Signed: 02/23/2020 5:14:43 PM By: [name]  Entered By: [name] on 02/22/2020 15:11:04

## 2020-02-23 NOTE — Progress Notes (Signed)
LAKER, THOMPSON ([id number]) Visit Report for 02/23/2020 Arrival Information Details Patient Name: Date of Service: [name] [name], [name] [name] 02/23/2020 3:00 PM Medical Record Number: [id number] Patient Account Number: [id number] Date of Birth/Sex: Treating RN: 04/25/1961 (59 y.o. [name] Primary Care Lugene Hitt: [name] Other Clinician: [name] Referring Loris Seelye: Treating Virgil Slinger/Extender: [name] Weeks in Treatment: 9 Visit Information History Since Last Visit Added or deleted any medications: No Patient Arrived: Ambulatory Any new allergies or adverse reactions: No Arrival Time: 15:00 Had a fall or experienced change in No Accompanied By: self activities of daily living that may affect Transfer Assistance: None risk of falls: Patient Identification Verified: Yes Signs or symptoms of abuse/neglect since last visito No Secondary Verification Process Completed: Yes Hospitalized since last visit: No Patient Requires Transmission-Based Precautions: No Implantable device outside of the clinic excluding No Patient Has Alerts: No cellular tissue based products placed in the center since last visit: Pain Present Now: No Electronic Signature(s) Signed: 02/23/2020 5:14:43 PM By: [name] Entered By: [name] on 02/23/2020 15:29:51 -------------------------------------------------------------------------------- Encounter Discharge Information Details Patient Name: Date of Service: [name] Coe. 02/23/2020 3:00 PM Medical Record Number: [id number] Patient Account Number: [id number] Date of Birth/Sex: Treating RN: 09/26/1960 (59 y.o. [name] Primary Care Aakash Hollomon: [name] Other Clinician: [name] Referring Wilferd Ritson: Treating Hailee Hollick/Extender: [name] Weeks in Treatment: 9 Encounter Discharge Information Items Discharge Condition: Stable Ambulatory Status:  Ambulatory Discharge Destination: Home Transportation: Private Auto Accompanied By: self Schedule Follow-up Appointment: Yes Clinical Summary of Care: Patient Declined Electronic Signature(s) Signed: 02/23/2020 5:14:43 PM By: [name] Entered By: [name] on 02/23/2020 17:14:15 -------------------------------------------------------------------------------- Patient/Caregiver Education Details Patient Name: Date of Service: [name] [name] 8/6/2021andnbsp3:00 PM Medical Record Number: [id number] Patient Account Number: [id number] Date of Birth/Gender: Treating RN: 11-06-1960 (59 y.o. [name] Primary Care Physician: [name] Other Clinician: [name] Referring Physician: Treating Physician/Extender: [name] Weeks in Treatment: 9 Education Assessment Education Provided To: Patient Education Topics Provided Hyperbaric Oxygenation: Methods: Explain/Verbal Responses: State content correctly Electronic Signature(s) Signed: 02/23/2020 5:14:43 PM By: [name] Entered By: [name] on 02/23/2020 17:13:56 -------------------------------------------------------------------------------- Vitals Details Patient Name: Date of Service: [name] Coe. 02/23/2020 3:00 PM Medical Record Number: [id number] Patient Account Number: [id number] Date of Birth/Sex: Treating RN: 30-Apr-1961 (59 y.o. [name] Primary Care Milca Sytsma: [name] Other Clinician: [name] Referring Kaiyden Simkin: Treating Meriel Kelliher/Extender: Eloy End, RYLEE Weeks in Treatment: 9 Vital Signs Time Taken: 15:03 Temperature (F): 98.2 Height (in): 75 Pulse (bpm): 90 Weight (lbs): 190 Respiratory Rate (breaths/min): 14 Body Mass Index (BMI): 23.7 Blood Pressure (mmHg): 132/85 Reference Range: 80 - 120 mg / dl Electronic Signature(s) Signed: 02/23/2020 5:14:43 PM By: [name] Entered By: [name] on  02/23/2020 15:30:23

## 2020-02-23 NOTE — Progress Notes (Signed)
[name] [name], [name] [name] ([id number]) Visit Report for 02/22/2020 Arrival Information Details Patient Name: Date of Service: [name] [name], [name] [name] 02/22/2020 3:00 PM Medical Record Number: [id number] Patient Account Number: [id number] Date of Birth/Sex: Treating RN: [birth year] (59 y.o. [name] Primary Care Evynn Boutelle: [name] Other Clinician: [name] Referring Rein Popov: Treating Maricarmen Braziel/Extender: [name] Weeks in Treatment: 8 Visit Information History Since Last Visit Added or deleted any medications: No Patient Arrived: Ambulatory Any new allergies or adverse reactions: No Arrival Time: 15:00 Had a fall or experienced change in No Accompanied By: self activities of daily living that may affect Transfer Assistance: None risk of falls: Patient Identification Verified: Yes Signs or symptoms of abuse/neglect since last visito No Secondary Verification Process Completed: Yes Hospitalized since last visit: No Patient Requires Transmission-Based Precautions: No Implantable device outside of the clinic excluding No Patient Has Alerts: No cellular tissue based products placed in the center since last visit: Pain Present Now: No Electronic Signature(s) Signed: 02/23/2020 5:14:43 PM By: [name] Entered By: [name] on 02/22/2020 15:10:07 -------------------------------------------------------------------------------- Encounter Discharge Information Details Patient Name: Date of Service: [name] Coe. 02/22/2020 3:00 PM Medical Record Number: [id number] Patient Account Number: [id number] Date of Birth/Sex: Treating RN: November 10, 1960 (59 y.o. [name] Primary Care Tempest Frankland: [name] Other Clinician: [name] Referring Juandedios Dudash: Treating Chinonso Linker/Extender: [name] Weeks in Treatment: 8 Encounter Discharge Information Items Discharge Condition: Stable Ambulatory Status:  Ambulatory Discharge Destination: Home Transportation: Private Auto Accompanied By: self Schedule Follow-up Appointment: No Clinical Summary of Care: Patient Declined Electronic Signature(s) Signed: 02/22/2020 5:23:33 PM By: [name] EMT/HBOT/SD Entered By: [name] on 02/22/2020 17:23:13 -------------------------------------------------------------------------------- Patient/Caregiver Education Details Patient Name: Date of Service: [name] [name], [name] [name] 8/5/2021andnbsp3:00 PM Medical Record Number: [id number] Patient Account Number: [id number] Date of Birth/Gender: Treating RN: 05-18-1961 (59 y.o. [name] Primary Care Physician: [name] Other Clinician: [name] Referring Physician: Treating Physician/Extender: [name] Weeks in Treatment: 8 Education Assessment Education Provided To: Patient Education Topics Provided Hyperbaric Oxygenation: Methods: Explain/Verbal Responses: State content correctly Electronic Signature(s) Signed: 02/22/2020 5:23:33 PM By: [name] EMT/HBOT/SD Entered By: [name] on 02/22/2020 17:23:03 -------------------------------------------------------------------------------- Vitals Details Patient Name: Date of Service: [name] Coe. 02/22/2020 3:00 PM Medical Record Number: [id number] Patient Account Number: [id number] Date of Birth/Sex: Treating RN: 01-Apr-1961 (59 y.o. [name] Primary Care Javaris Wigington: [name] Other Clinician: [name] Referring Sundance Moise: Treating Ander Wamser/Extender: Eloy End, RYLEE Weeks in Treatment: 8 Vital Signs Time Taken: 15:02 Temperature (F): 98.4 Height (in): 75 Pulse (bpm): 50 Weight (lbs): 190 Respiratory Rate (breaths/min): 18 Body Mass Index (BMI): 23.7 Blood Pressure (mmHg): 131/89 Reference Range: 80 - 120 mg / dl Electronic Signature(s) Signed: 02/23/2020 5:14:43 PM By: [name] Entered  By: [name] on 02/22/2020 15:10:32

## 2020-02-26 ENCOUNTER — Encounter (HOSPITAL_BASED_OUTPATIENT_CLINIC_OR_DEPARTMENT_OTHER): Payer: Medicaid Other | Admitting: Internal Medicine

## 2020-02-26 ENCOUNTER — Other Ambulatory Visit: Payer: Self-pay

## 2020-02-26 DIAGNOSIS — M272 Inflammatory conditions of jaws: Secondary | ICD-10-CM | POA: Diagnosis not present

## 2020-02-26 DIAGNOSIS — L598 Other specified disorders of the skin and subcutaneous tissue related to radiation: Secondary | ICD-10-CM | POA: Diagnosis not present

## 2020-02-26 NOTE — Progress Notes (Signed)
JONTE, SHILLER ([id number]) Visit Report for 02/23/2020 SuperBill Details Patient Name: Date of Service: [name] [name], [name] [name] 02/23/2020 Medical Record Number: [id number] Patient Account Number: [id number] Date of Birth/Sex: Treating RN: 1960/08/29 (59 y.o. 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

## 2020-02-26 NOTE — Progress Notes (Signed)
[name] [name] ([id number]) Visit Report for 02/23/2020 HBO Details Patient Name: Date of Service: [name], [name] 02/23/2020 3:00 PM Medical Record Number: [id number] Patient Account Number: [id number] Date of Birth/Sex: Treating RN: 07-01-1961 (59 y.o. 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 HBO Treatment Details Treatment Number: 27 Patient Type: Outpatient Chamber Type: Monoplace Chamber Serial #: [id number] Treatment Protocol: 2.5 ATA with 90 minutes oxygen, with two 5 minute air breaks Treatment Details A breaks and breathing ir Compress Tx Pressure periods Decompress Decompress Begins Reached (leave unused spaces Begins Ends blank) Chamber Pressure (ATA 1 2.5 2.5 2.5 2.5 2.5 - - 2.5 1 ) Clock Time (24 hr) 15:11 15:23 15:53 15:58 16:28 16:33 - - 17:03 17:09 Treatment Length: 118 (minutes) Treatment Segments: 4 Vital Signs Capillary Blood Glucose Reference Range: 80 - 120 mg / dl HBO Diabetic Blood Glucose Intervention Range: <131 mg/dl or >249 mg/dl Time Vitals Blood Respiratory Capillary Blood Glucose Pulse Action Type: Pulse: Temperature: Taken: Pressure: Rate: Glucose (mg/dl): Meter #: Oximetry (%) Taken: Pre 15:03 132/85 90 14 98.2 Post 17:11 128/86 86 16 98.3 Treatment Response Treatment Toleration: Well Treatment Completion Status: Treatment Completed without Adverse Event Electronic Signature(s) Signed: 02/23/2020 5:14:43 PM By: [name] Signed: 02/26/2020 2:22:24 PM By: [name] PA-C Entered By: [name] on 02/23/2020  17:13:31 -------------------------------------------------------------------------------- HBO Safety Checklist Details Patient Name: Date of Service: [name] Coe. 02/23/2020 3:00 PM Medical Record Number: [id number] Patient Account Number: [id number] Date of Birth/Sex: Treating RN: [birth year] (59 y.o. [name] [name] Primary Care Geisha Abernathy: [name] Other Clinician: [name] Referring Bo Rogue: Treating Merlie Noga/Extender: [name] Weeks in Treatment: 9 HBO Safety Checklist Items Safety Checklist Consent Form Signed Patient voided / foley secured and emptied When did you last eato N/A Last dose of injectable or oral agent Ostomy pouch emptied and vented if applicable NA All implantable devices assessed, documented and approved NA Intravenous access site secured and place NA Valuables secured Linens and cotton and cotton/polyester blend (less than 51% polyester) Personal oil-based products / skin lotions / body lotions removed Wigs or hairpieces removed Smoking or tobacco materials removed Books / newspapers / magazines / loose paper removed Cologne, aftershave, perfume and deodorant removed Jewelry removed (may wrap wedding band) Make-up removed NA Hair care products removed NA Battery operated devices (external) removed NA Heating patches and chemical warmers removed NA Titanium eyewear removed NA Nail polish cured greater than 10 hours NA Casting material cured greater than 10 hours NA Hearing aids removed NA Loose dentures or partials removed NA Prosthetics have been removed NA Patient demonstrates correct use of air break device (if applicable) Patient concerns have been addressed Patient grounding bracelet on and cord attached to chamber Specifics for Inpatients (complete in addition to above) Medication sheet sent with patient Intravenous medications needed or due during therapy sent with patient Drainage  tubes (e.g. nasogastric tube or chest tube secured and vented) Endotracheal or Tracheotomy tube secured Cuff deflated of air and inflated with saline Airway suctioned Electronic Signature(s) Signed: 02/23/2020 5:14:43 PM By: [name] Entered By: [name] on 02/23/2020 15:30:53

## 2020-02-27 ENCOUNTER — Encounter (HOSPITAL_BASED_OUTPATIENT_CLINIC_OR_DEPARTMENT_OTHER): Payer: Medicaid Other | Admitting: Internal Medicine

## 2020-02-27 DIAGNOSIS — L598 Other specified disorders of the skin and subcutaneous tissue related to radiation: Secondary | ICD-10-CM | POA: Diagnosis not present

## 2020-02-27 NOTE — Progress Notes (Signed)
JUSTO, HENGEL ([id number]) Visit Report for 02/26/2020 SuperBill Details Patient Name: Date of Service: [name] [name], [name] [name] 02/26/2020 Medical Record Number: [id number] Patient Account Number: [id number] Date of Birth/Sex: Treating RN: 1961-07-03 (59 y.o. 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

## 2020-02-28 ENCOUNTER — Encounter (HOSPITAL_BASED_OUTPATIENT_CLINIC_OR_DEPARTMENT_OTHER): Payer: Medicaid Other | Admitting: Physician Assistant

## 2020-02-28 NOTE — Telephone Encounter (Signed)
Opened in error

## 2020-02-29 ENCOUNTER — Encounter (HOSPITAL_BASED_OUTPATIENT_CLINIC_OR_DEPARTMENT_OTHER): Payer: Medicaid Other | Admitting: Internal Medicine

## 2020-02-29 DIAGNOSIS — L598 Other specified disorders of the skin and subcutaneous tissue related to radiation: Secondary | ICD-10-CM | POA: Diagnosis not present

## 2020-02-29 NOTE — Progress Notes (Signed)
[name], [name] ([id number]) Visit Report for 02/27/2020 SuperBill Details Patient Name: Date of Service: [name], [name] 02/27/2020 Medical Record Number: [id number] Patient Account Number: [id number] Date of Birth/Sex: Treating RN: 14-Apr-1961 (59 y.o. 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

## 2020-03-01 ENCOUNTER — Encounter (HOSPITAL_BASED_OUTPATIENT_CLINIC_OR_DEPARTMENT_OTHER): Payer: Medicaid Other | Admitting: Internal Medicine

## 2020-03-01 DIAGNOSIS — L598 Other specified disorders of the skin and subcutaneous tissue related to radiation: Secondary | ICD-10-CM | POA: Diagnosis not present

## 2020-03-01 NOTE — Progress Notes (Signed)
DRAIDEN, MIRSKY ([id number]) Visit Report for 02/29/2020 SuperBill Details Patient Name: Date of Service: [name] [name], [name] [name] 02/29/2020 Medical Record Number: [id number] Patient Account Number: [id number] Date of Birth/Sex: Treating RN: 12-Jul-1961 (59 y.o. 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

## 2020-03-04 ENCOUNTER — Encounter (HOSPITAL_BASED_OUTPATIENT_CLINIC_OR_DEPARTMENT_OTHER): Payer: Medicaid Other | Admitting: Internal Medicine

## 2020-03-04 DIAGNOSIS — L598 Other specified disorders of the skin and subcutaneous tissue related to radiation: Secondary | ICD-10-CM | POA: Diagnosis not present

## 2020-03-04 DIAGNOSIS — M272 Inflammatory conditions of jaws: Secondary | ICD-10-CM | POA: Diagnosis not present

## 2020-03-04 NOTE — Progress Notes (Addendum)
[name] [name], [name] [name] ([id number]) Visit Report for 03/04/2020 HBO Details Patient Name: Date of Service: [name] [name], [name] [name] 03/04/2020 3:00 PM Medical Record Number: [id number] Patient Account Number: [id number] Date of Birth/Sex: Treating RN: 1961-06-01 (59 y.o. 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 HBO Treatment Details Treatment Number: 32 Patient Type: Outpatient Chamber Type: Monoplace Chamber Serial #: [id number] Treatment Protocol: 2.5 ATA with 90 minutes oxygen, with two 5 minute air breaks Treatment Details Compression Rate Down: 2.0 psi / minute De-Compression Rate Up: 3.0 psi / minute A breaks and breathing ir Compress Tx Pressure periods Decompress Decompress Begins Reached (leave unused spaces Begins Ends blank) Chamber Pressure (ATA 1 2.5 2.5 2.5 2.5 2.5 - - 2.5 1 ) Clock Time (24 hr) 15:01 15:13 15:43 15:48 16:18 16:23 - - 16:53 16:59 Treatment Length: 118 (minutes) Treatment Segments: 4 Vital Signs Capillary Blood Glucose Reference Range: 80 - 120 mg / dl HBO Diabetic Blood Glucose Intervention Range: <131 mg/dl or >249 mg/dl Time Vitals Blood Respiratory Capillary Blood Glucose Pulse Action Type: Pulse: Temperature: Taken: Pressure: Rate: Glucose (mg/dl): Meter #: Oximetry (%) Taken: Pre 14:55 138/72 100 15 97.8 Post 17:01 115/91 79 15 98 Treatment Response Treatment Toleration: Well Treatment Completion Status: Treatment Completed without Adverse Event Ernisha Sorn Notes No concerns with treatment given on Physician HBO Attestation: I certify that I supervised this HBO treatment in accordance  with Medicare guidelines. A trained emergency response team is readily available per Yes hospital policies and procedures. Continue HBOT as ordered. Yes Electronic Signature(s) Signed: 03/04/2020 5:53:04 PM By: Linton Ham MD Previous Signature: 03/04/2020 5:23:17 PM Version By: [name] EMT/HBOT/SD Entered By: Linton Ham on 03/04/2020 17:51:12 -------------------------------------------------------------------------------- HBO Safety Checklist Details Patient Name: Date of Service: [name] [name], [name] [name]. 03/04/2020 3:00 PM Medical Record Number: [id number] Patient Account Number: [id number] Date of Birth/Sex: Treating RN: [birth year] (59 y.o. [name] Primary Care Shaquita Fort: [name] Other Clinician: [name] Referring Zandrea Kenealy: Treating Ferris Fielden/Extender: [name] Weeks in Treatment: 10 HBO Safety Checklist Items Safety Checklist Consent Form Signed Patient voided / foley secured and emptied When did you last eato n/a Last dose of injectable or oral agent n/a Ostomy pouch emptied and vented if applicable NA All implantable devices assessed, documented and approved NA Intravenous access site secured and place NA Valuables secured Linens and cotton and cotton/polyester blend (less than 51% polyester) Personal oil-based products / skin lotions / body lotions removed Wigs or hairpieces removed NA Smoking or tobacco materials removed NA Books / newspapers / magazines / loose paper removed Cologne, aftershave, perfume and deodorant removed Jewelry removed (may wrap wedding band) Make-up removed NA Hair care products removed Battery operated devices (external) removed NA Heating patches and chemical warmers removed NA Titanium eyewear removed NA Nail polish cured greater than 10 hours NA Casting material cured greater than 10 hours NA Hearing aids removed NA Loose dentures or partials removed NA Prosthetics  have been removed NA Patient demonstrates correct use of air break device (if applicable) Patient concerns have been addressed Patient grounding bracelet on and cord attached to chamber Specifics for Inpatients (complete in addition to above) Medication sheet sent with patient Intravenous medications needed  or due during therapy sent with patient Drainage tubes (e.g. nasogastric tube or chest tube secured and vented) Endotracheal or Tracheotomy tube secured Cuff deflated of air and inflated with saline Airway suctioned Electronic Signature(s) Signed: 03/04/2020 3:26:42 PM By: [name] EMT/HBOT/SD Entered By: [name] on 03/04/2020 15:26:41

## 2020-03-04 NOTE — Progress Notes (Signed)
[name] [name], [name] [name] ([id number]) Visit Report for 03/04/2020 Arrival Information Details Patient Name: Date of Service: [name] [name], [name] [name] 03/04/2020 3:00 PM Medical Record Number: [id number] Patient Account Number: [id number] Date of Birth/Sex: Treating RN: 11-26-1960 (59 y.o. [name] Primary Care Jesslyn Viglione: [name] Other Clinician: [name] Referring Rosalin Buster: Treating Delise Simenson/Extender: [name] Weeks in Treatment: 10 Visit Information History Since Last Visit Added or deleted any medications: No Patient Arrived: Ambulatory Any new allergies or adverse reactions: No Arrival Time: 14:50 Had a fall or experienced change in No Accompanied By: self activities of daily living that may affect Transfer Assistance: None risk of falls: Patient Identification Verified: Yes Signs or symptoms of abuse/neglect since last visito No Secondary Verification Process Completed: Yes Hospitalized since last visit: No Patient Requires Transmission-Based Precautions: No Implantable device outside of the clinic excluding No Patient Has Alerts: No cellular tissue based products placed in the center since last visit: Pain Present Now: No Electronic Signature(s) Signed: 03/04/2020 5:23:17 PM By: [name] EMT/HBOT/SD Entered By: [name] on 03/04/2020 15:25:50 -------------------------------------------------------------------------------- Encounter Discharge Information Details Patient Name: Date of Service: [name] Coe. 03/04/2020 3:00 PM Medical Record Number: [id number] Patient Account Number: [id number] Date of Birth/Sex: Treating RN: 1961/02/19 (59 y.o. [name] Primary Care Kinston Magnan: [name] Other Clinician: [name] Referring Makaio Mach: Treating Hulon Ferron/Extender: [name] Weeks in Treatment: 10 Encounter Discharge Information Items Discharge Condition: Stable Ambulatory Status:  Ambulatory Discharge Destination: Home Transportation: Private Auto Accompanied By: self Schedule Follow-up Appointment: Yes Clinical Summary of Care: Patient Declined Electronic Signature(s) Signed: 03/04/2020 5:23:17 PM By: [name] EMT/HBOT/SD Entered By: [name] on 03/04/2020 17:12:57 -------------------------------------------------------------------------------- Patient/Caregiver Education Details Patient Name: Date of Service: [name] [name], [name] [name] 8/16/2021andnbsp3:00 PM Medical Record Number: [id number] Patient Account Number: [id number] Date of Birth/Gender: Treating RN: Dec 12, 1960 (59 y.o. [name] Primary Care Physician: [name] Other Clinician: [name] Referring Physician: Treating Physician/Extender: Renard Hamper in Treatment: 10 Education Assessment Education Provided To: Patient Education Topics Provided Hyperbaric Oxygenation: Methods: Explain/Verbal Responses: State content correctly Electronic Signature(s) Signed: 03/04/2020 5:23:17 PM By: [name] EMT/HBOT/SD Entered By: [name] on 03/04/2020 17:12:44 -------------------------------------------------------------------------------- Vitals Details Patient Name: Date of Service: [name] Coe. 03/04/2020 3:00 PM Medical Record Number: [id number] Patient Account Number: [id number] Date of Birth/Sex: Treating RN: [birth year] (59 y.o. [name] Primary Care Bartolo Montanye: [name] Other Clinician: [name] Referring Deloss Amico: Treating Arley Garant/Extender: [name] Weeks in Treatment: 10 Vital Signs Time Taken: 14:55 Temperature (F): 97.8 Height (in): 75 Pulse (bpm): 100 Weight (lbs): 190 Respiratory Rate (breaths/min): 15 Body Mass Index (BMI): 23.7 Blood Pressure (mmHg): 138/72 Reference Range: 80 - 120 mg / dl Electronic Signature(s) Signed: 03/04/2020 5:23:17 PM By: [name] EMT/HBOT/SD Entered By: [name] on 03/04/2020 15:26:06

## 2020-03-04 NOTE — Progress Notes (Signed)
ARYE, WEYENBERG ([id number]) Visit Report for 03/04/2020 SuperBill Details Patient Name: Date of Service: TYWONE, BEMBENEK 03/04/2020 Medical Record Number: [id number] Patient Account Number: [id number] Date of Birth/Sex: Treating RN: 05-Jul-1961 (59 y.o. 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

## 2020-03-05 ENCOUNTER — Encounter (HOSPITAL_BASED_OUTPATIENT_CLINIC_OR_DEPARTMENT_OTHER): Payer: Medicaid Other | Admitting: Internal Medicine

## 2020-03-05 DIAGNOSIS — M272 Inflammatory conditions of jaws: Secondary | ICD-10-CM | POA: Diagnosis not present

## 2020-03-05 DIAGNOSIS — L598 Other specified disorders of the skin and subcutaneous tissue related to radiation: Secondary | ICD-10-CM | POA: Diagnosis not present

## 2020-03-05 NOTE — Progress Notes (Addendum)
[name], [name] [name] ([id number]) Visit Report for 03/05/2020 SuperBill Details Patient Name: Date of Service: [name] [name], [name] [name] 03/05/2020 Medical Record Number: [id number] Patient Account Number: [id number] Date of Birth/Sex: Treating RN: 1961/02/09 (59 y.o. 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, unspecified Facility Procedures CPT4 Code Description Modifier Quantity [id number] 99183-Physician attendance and supervision of hyperbaric oxygen therapy, per session 1 ICD-10 Diagnosis Description L59.8 Other specified disorders of the skin and subcutaneous tissue related to radiation Physician Procedures Quantity CPT4 Code Description Modifier [id number] 92763 - WC PHYS HYPERBARIC OXYGEN THERAPY 1 ICD-10 Diagnosis Description L59.8 Other specified disorders of the skin and subcutaneous tissue related to radiation Electronic Signature(s) Signed: 03/07/2020 5:08:58 PM By: [name] EMT/HBOT/SD Signed: 03/07/2020 5:57:44 PM By: Linton Ham MD Previous Signature: 03/05/2020 5:19:12 PM Version By: [name] EMT/HBOT/SD Previous Signature: 03/05/2020 6:57:29 PM Version By: Linton Ham MD Entered By: [name] on 03/07/2020 11:28:09

## 2020-03-05 NOTE — Progress Notes (Addendum)
[name] [name], [name] [name] ([id number]) Visit Report for 03/05/2020 HBO Details Patient Name: Date of Service: [name] [name], [name] [name] 03/05/2020 3:00 PM Medical Record Number: [id number] Patient Account Number: [id number] Date of Birth/Sex: Treating RN: [birth year] (59 y.o. 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 HBO Treatment Details Treatment Number: 33 Patient Type: Outpatient Chamber Type: Monoplace Chamber Serial #: [id number] Treatment Protocol: 2.5 ATA with 90 minutes oxygen, with two 5 minute air breaks Treatment Details Compression Rate Down: 2.0 psi / minute De-Compression Rate Up: 3.0 psi / minute A breaks and breathing ir Compress Tx Pressure periods Decompress Decompress Begins Reached (leave unused spaces Begins Ends blank) Chamber Pressure (ATA 1 2.5 2.5 2.5 2.5 2.5 - - 2.5 1 ) Clock Time (24 hr) 14:42 14:54 15:24 15:29 15:59 16:04 - - 16:34 16:40 Treatment Length: 118 (minutes) Treatment Segments: 4 Vital Signs Capillary Blood Glucose Reference Range: 80 - 120 mg / dl HBO Diabetic Blood Glucose Intervention Range: <131 mg/dl or >249 mg/dl Time Vitals Blood Respiratory Capillary Blood Glucose Pulse Action Type: Pulse: Temperature: Taken: Pressure: Rate: Glucose (mg/dl): Meter #: Oximetry (%) Taken: Pre 14:35 113/76 88 16 98.6 Post 16:42 145/84 72 16 98.1 Treatment Response Treatment Toleration: Well Treatment Completion Status: Treatment Completed without Adverse Event Shakesha Soltau Notes no concerns with treatment given Physician HBO Attestation: I certify that I supervised this HBO treatment in accordance with  Medicare guidelines. A trained emergency response team is readily available per Yes hospital policies and procedures. Continue HBOT as ordered. Yes Electronic Signature(s) Signed: 03/05/2020 6:57:29 PM By: Linton Ham MD Previous Signature: 03/05/2020 5:19:12 PM Version By: [name] EMT/HBOT/SD Entered By: Linton Ham on 03/05/2020 18:56:02 -------------------------------------------------------------------------------- HBO Safety Checklist Details Patient Name: Date of Service: [name] [name]. 03/05/2020 3:00 PM Medical Record Number: [id number] Patient Account Number: [id number] Date of Birth/Sex: Treating RN: 01-04-1961 (59 y.o. [name] [name]) [name] Primary Care [name]: [name] Other Clinician: [name] Referring Tacara Hadlock: Treating Rhoda Waldvogel/Extender: [name] Weeks in Treatment: 10 HBO Safety Checklist Items Safety Checklist Consent Form Signed Patient voided / foley secured and emptied When did you last eato n/a Last dose of injectable or oral agent n/a Ostomy pouch emptied and vented if applicable NA All implantable devices assessed, documented and approved NA Intravenous access site secured and place NA Valuables secured Linens and cotton and cotton/polyester blend (less than 51% polyester) Personal oil-based products / skin lotions / body lotions removed Wigs or hairpieces removed NA Smoking or tobacco materials removed Books / newspapers / magazines / loose paper removed Cologne, aftershave, perfume and deodorant removed Jewelry removed (may wrap wedding band) Make-up removed NA Hair care products removed Battery operated devices (external) removed NA Heating patches and chemical warmers removed NA Titanium eyewear removed NA Nail polish cured greater than 10 hours NA Casting material cured greater than 10 hours NA Hearing aids removed NA Loose dentures or partials removed NA Prosthetics have been  removed NA Patient demonstrates correct use of air break device (if applicable) Patient concerns have been addressed Patient grounding bracelet on and cord attached to chamber Specifics for Inpatients (complete in addition to above) Medication sheet sent with patient Intravenous medications needed or due  during therapy sent with patient Drainage tubes (e.g. nasogastric tube or chest tube secured and vented) Endotracheal or Tracheotomy tube secured Cuff deflated of air and inflated with saline Airway suctioned Electronic Signature(s) Signed: 03/05/2020 3:16:59 PM By: [name] EMT/HBOT/SD Entered By: [name] on 03/05/2020 15:16:58

## 2020-03-05 NOTE — Progress Notes (Signed)
MEARL, OLVER ([id number]) Visit Report for 03/05/2020 Arrival Information Details Patient Name: Date of Service: [name] [name], [name] [name] 03/05/2020 3:00 PM Medical Record Number: [id number] Patient Account Number: [id number] Date of Birth/Sex: Treating RN: 10-16-1960 (59 y.o. [name]) [name] Primary Care Zivah Mayr: [name] Other Clinician: [name] Referring Sael Furches: Treating Dodd Schmid/Extender: [name] Weeks in Treatment: 10 Visit Information History Since Last Visit Added or deleted any medications: No Patient Arrived: Ambulatory Any new allergies or adverse reactions: No Arrival Time: 14:30 Had a fall or experienced change in No Accompanied By: self activities of daily living that may affect Transfer Assistance: None risk of falls: Patient Identification Verified: Yes Signs or symptoms of abuse/neglect since last visito No Secondary Verification Process Completed: Yes Hospitalized since last visit: No Patient Requires Transmission-Based Precautions: No Implantable device outside of the clinic excluding No Patient Has Alerts: No cellular tissue based products placed in the center since last visit: Pain Present Now: No Electronic Signature(s) Signed: 03/05/2020 5:19:12 PM By: [name] EMT/HBOT/SD Entered By: [name] on 03/05/2020 15:15:34 -------------------------------------------------------------------------------- Encounter Discharge Information Details Patient Name: Date of Service: [name] Coe. 03/05/2020 3:00 PM Medical Record Number: [id number] Patient Account Number: [id number] Date of Birth/Sex: Treating RN: [birth year] (59 y.o. [name] Primary Care Tehillah Cipriani: [name] Other Clinician: [name] Referring Signe Tackitt: Treating Kemba Hoppes/Extender: [name] Weeks in Treatment: 10 Encounter Discharge Information Items Discharge Condition: Stable Ambulatory Status:  Ambulatory Discharge Destination: Home Transportation: Private Auto Accompanied By: self Schedule Follow-up Appointment: Yes Clinical Summary of Care: Patient Declined Electronic Signature(s) Signed: 03/05/2020 5:19:12 PM By: [name] EMT/HBOT/SD Entered By: [name] on 03/05/2020 17:16:36 -------------------------------------------------------------------------------- Patient/Caregiver Education Details Patient Name: Date of Service: [name] [name], [name] [name] 8/17/2021andnbsp3:00 PM Medical Record Number: [id number] Patient Account Number: [id number] Date of Birth/Gender: Treating RN: [birth year] (59 y.o. [name] Primary Care Physician: [name] Other Clinician: [name] Referring Physician: Treating Physician/Extender: [name] Weeks in Treatment: 10 Education Assessment Education Provided To: Patient Education Topics Provided Hyperbaric Oxygenation: Methods: Explain/Verbal Responses: State content correctly Electronic Signature(s) Signed: 03/05/2020 5:19:12 PM By: [name] EMT/HBOT/SD Entered By: [name] on 03/05/2020 17:16:21 -------------------------------------------------------------------------------- Vitals Details Patient Name: Date of Service: [name] Coe. 03/05/2020 3:00 PM Medical Record Number: [id number] Patient Account Number: [id number] Date of Birth/Sex: Treating RN: October 04, 1960 (59 y.o. [name] Primary Care Ethyl Vila: [name] Other Clinician: [name] Referring Bridget Westbrooks: Treating Rodricus Candelaria/Extender: Birdena Crandall, RYLEE Weeks in Treatment: 10 Vital Signs Time Taken: 14:35 Temperature (F): 98.6 Height (in): 75 Pulse (bpm): 88 Weight (lbs): 190 Respiratory Rate (breaths/min): 16 Body Mass Index (BMI): 23.7 Blood Pressure (mmHg): 113/76 Reference Range: 80 - 120 mg / dl Electronic Signature(s) Signed: 03/05/2020 5:19:12 PM By: [name]  EMT/HBOT/SD Entered By: [name] on 03/05/2020 15:16:20

## 2020-03-06 ENCOUNTER — Encounter (HOSPITAL_BASED_OUTPATIENT_CLINIC_OR_DEPARTMENT_OTHER): Payer: Medicaid Other | Admitting: Physician Assistant

## 2020-03-07 ENCOUNTER — Encounter (HOSPITAL_BASED_OUTPATIENT_CLINIC_OR_DEPARTMENT_OTHER): Payer: Medicaid Other | Admitting: Internal Medicine

## 2020-03-08 ENCOUNTER — Encounter (HOSPITAL_BASED_OUTPATIENT_CLINIC_OR_DEPARTMENT_OTHER): Payer: Medicaid Other | Admitting: Internal Medicine

## 2020-03-08 ENCOUNTER — Other Ambulatory Visit: Payer: Self-pay

## 2020-03-08 DIAGNOSIS — M272 Inflammatory conditions of jaws: Secondary | ICD-10-CM | POA: Diagnosis not present

## 2020-03-08 DIAGNOSIS — L598 Other specified disorders of the skin and subcutaneous tissue related to radiation: Secondary | ICD-10-CM | POA: Diagnosis not present

## 2020-03-08 NOTE — Progress Notes (Addendum)
[name] [name] ([id number]) Visit Report for 03/08/2020 HBO Details Patient Name: Date of Service: [name] [name], [name] [name] 03/08/2020 2:00 PM Medical Record Number: [id number] Patient Account Number: [id number] Date of Birth/Sex: Treating RN: [birth year] (59 y.o. 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 HBO Treatment Details Treatment Number: 34 Patient Type: Outpatient Chamber Type: Monoplace Chamber Serial #: [id number] Treatment Protocol: 2.5 ATA with 90 minutes oxygen, with two 5 minute air breaks Treatment Details Compression Rate Down: 2.0 psi / minute De-Compression Rate Up: 3.0 psi / minute A breaks and breathing ir Compress Tx Pressure periods Decompress Decompress Begins Reached (leave unused spaces Begins Ends blank) Chamber Pressure (ATA 1 2.5 2.5 2.5 2.5 2.5 - - 2.5 1 ) Clock Time (24 hr) 14:09 14:21 14:51 14:56 15:26 15:31 - - 16:01 16:07 Treatment Length: 118 (minutes) Treatment Segments: 4 Vital Signs Capillary Blood Glucose Reference Range: 80 - 120 mg / dl HBO Diabetic Blood Glucose Intervention Range: <131 mg/dl or >249 mg/dl Time Vitals Blood Respiratory Capillary Blood Glucose Pulse Action Type: Pulse: Temperature: Taken: Pressure: Rate: Glucose (mg/dl): Meter #: Oximetry (%) Taken: Pre 14:05 140/90 86 15 98.3 Post 16:10 127/98 94 15 98.4 Treatment Response Treatment Toleration: Well Treatment Completion Status: Treatment Completed without Adverse Event Shannelle Alguire Notes No concerns with treatment given Physician HBO Attestation: I certify that I supervised this HBO treatment in accordance  with Medicare guidelines. A trained emergency response team is readily available per Yes hospital policies and procedures. Continue HBOT as ordered. Yes Electronic Signature(s) Signed: 03/11/2020 7:15:26 AM By: Linton Ham MD Previous Signature: 03/08/2020 4:33:41 PM Version By: [name] EMT/HBOT/SD Entered By: Linton Ham on 03/11/2020 07:12:20 -------------------------------------------------------------------------------- HBO Safety Checklist Details Patient Name: Date of Service: [name] [name], [name] [name] 03/08/2020 2:00 PM Medical Record Number: [id number] Patient Account Number: [id number] Date of Birth/Sex: Treating RN: 10-04-1960 (58 y.o. [name] Primary Care Lechelle Wrigley: [name] Other Clinician: [name] Referring Dmiyah Liscano: Treating Mckynlee Luse/Extender: [name] Weeks in Treatment: 11 HBO Safety Checklist Items Safety Checklist Consent Form Signed Patient voided / foley secured and emptied When did you last eato n/a Last dose of injectable or oral agent n/a Ostomy pouch emptied and vented if applicable NA All implantable devices assessed, documented and approved NA Intravenous access site secured and place NA Valuables secured Linens and cotton and cotton/polyester blend (less than 51% polyester) Personal oil-based products / skin lotions / body lotions removed Wigs or hairpieces removed NA Smoking or tobacco materials removed Books / newspapers / magazines / loose paper removed Cologne, aftershave, perfume and deodorant removed Jewelry removed (may wrap wedding band) Make-up removed NA Hair care products removed Battery operated devices (external) removed NA Heating patches and chemical warmers removed NA Titanium eyewear removed NA Nail polish cured greater than 10 hours NA Casting material cured greater than 10 hours NA Hearing aids removed NA Loose dentures or partials removed NA Prosthetics  have been removed NA Patient demonstrates correct use of air break device (if applicable) Patient concerns have been addressed Patient grounding bracelet on and cord attached to chamber Specifics for Inpatients (complete in addition to above) Medication sheet sent with patient Intravenous medications needed or due  during therapy sent with patient Drainage tubes (e.g. nasogastric tube or chest tube secured and vented) Endotracheal or Tracheotomy tube secured Cuff deflated of air and inflated with saline Airway suctioned Electronic Signature(s) Signed: 03/08/2020 3:09:59 PM By: [name] EMT/HBOT/SD Entered By: [name] on 03/08/2020 15:09:58

## 2020-03-08 NOTE — Progress Notes (Signed)
ROXIE, GUEYE ([id number]) Visit Report for 03/08/2020 Arrival Information Details Patient Name: Date of Service: [name] [name], [name] [name] 03/08/2020 2:00 PM Medical Record Number: [id number] Patient Account Number: [id number] Date of Birth/Sex: Treating RN: September 03, 1960 (59 y.o. [name] [name] Primary Care Agustina Witzke: [name] Other Clinician: [name] Referring Arretta Toenjes: Treating Milcah Dulany/Extender: [name] Weeks in Treatment: 11 Visit Information History Since Last Visit Added or deleted any medications: No Patient Arrived: Ambulatory Any new allergies or adverse reactions: No Arrival Time: 14:00 Had a fall or experienced change in No Accompanied By: self activities of daily living that may affect Transfer Assistance: None risk of falls: Patient Identification Verified: Yes Signs or symptoms of abuse/neglect since last visito No Secondary Verification Process Completed: Yes Hospitalized since last visit: No Patient Requires Transmission-Based Precautions: No Implantable device outside of the clinic excluding No Patient Has Alerts: No cellular tissue based products placed in the center since last visit: Pain Present Now: No Electronic Signature(s) Signed: 03/08/2020 4:33:41 PM By: [name] EMT/HBOT/SD Entered By: [name] on 03/08/2020 15:09:01 -------------------------------------------------------------------------------- Encounter Discharge Information Details Patient Name: Date of Service: [name] Coe. 03/08/2020 2:00 PM Medical Record Number: [id number] Patient Account Number: [id number] Date of Birth/Sex: Treating RN: May 13, 1961 (59 y.o. [name] [name] Primary Care Tekeya Geffert: [name] Other Clinician: [name] Referring Bettie Swavely: Treating Eilam Sh[place]/Extender: [name] Weeks in Treatment: 11 Encounter Discharge Information Items Discharge Condition: Stable Ambulatory  Status: Ambulatory Discharge Destination: Home Transportation: Private Auto Accompanied By: self Schedule Follow-up Appointment: Yes Clinical Summary of Care: Patient Declined Electronic Signature(s) Signed: 03/08/2020 4:33:41 PM By: [name] EMT/HBOT/SD Entered By: [name] on 03/08/2020 16:33:25 -------------------------------------------------------------------------------- Patient/Caregiver Education Details Patient Name: Date of Service: [name] [name], [name] [name] 8/20/2021andnbsp2:00 PM Medical Record Number: [id number] Patient Account Number: [id number] Date of Birth/Gender: Treating RN: 1960-12-10 (59 y.o. [name] [name] Primary Care Physician: [name] Other Clinician: [name] Referring Physician: Treating Physician/Extender: Renard Hamper in Treatment: 11 Education Assessment Education Provided To: Patient Education Topics Provided Hyperbaric Oxygenation: Methods: Explain/Verbal Responses: State content correctly Electronic Signature(s) Signed: 03/08/2020 4:33:41 PM By: [name] EMT/HBOT/SD Entered By: [name] on 03/08/2020 16:33:11 -------------------------------------------------------------------------------- Vitals Details Patient Name: Date of Service: [name] Coe. 03/08/2020 2:00 PM Medical Record Number: [id number] Patient Account Number: [id number] Date of Birth/Sex: Treating RN: 13-Nov-1960 (59 y.o. [name] [name] Primary Care Bettie Capistran: [name] Other Clinician: [name] Referring Levie Wages: Treating Zahriyah Joo/Extender: [name] Weeks in Treatment: 11 Vital Signs Time Taken: 14:05 Temperature (F): 98.3 Height (in): 75 Pulse (bpm): 86 Weight (lbs): 190 Respiratory Rate (breaths/min): 15 Body Mass Index (BMI): 23.7 Blood Pressure (mmHg): 140/90 Reference Range: 80 - 120 mg / dl Electronic Signature(s) Signed: 03/08/2020 4:33:41 PM By:  [name] EMT/HBOT/SD Entered By: [name] on 03/08/2020 15:09:21

## 2020-03-11 ENCOUNTER — Telehealth: Payer: Self-pay | Admitting: Adult Health Nurse Practitioner

## 2020-03-11 ENCOUNTER — Encounter (HOSPITAL_BASED_OUTPATIENT_CLINIC_OR_DEPARTMENT_OTHER): Payer: Medicaid Other | Admitting: Internal Medicine

## 2020-03-11 NOTE — Progress Notes (Signed)
MISAEL, MCGAHA ([id number]) Visit Report for 03/08/2020 SuperBill Details Patient Name: Date of Service: [name] [name], [name] [name] 03/08/2020 Medical Record Number: [id number] Patient Account Number: [id number] Date of Birth/Sex: Treating RN: 01/03/1961 (59 y.o. 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, unspecified Facility Procedures CPT4 Code Description Modifier Quantity [id number]-Physician attendance and supervision of hyperbaric oxygen therapy, per session 1 ICD-10 Diagnosis Description L59.8 Other specified disorders of the skin and subcutaneous tissue related to radiation Physician Procedures Quantity CPT4 Code Description Modifier [id number] - WC PHYS HYPERBARIC OXYGEN THERAPY 1 ICD-10 Diagnosis Description L59.8 Other specified disorders of the skin and subcutaneous tissue related to radiation Electronic Signature(s) Signed: 03/08/2020 4:33:41 PM By: [name] EMT/HBOT/SD Signed: 03/11/2020 7:15:26 AM By: Linton Ham MD Entered By: [name] on 03/08/2020 16:32:59

## 2020-03-11 NOTE — Telephone Encounter (Signed)
Returned VM left this AM.  Patient needing refill on oxycodone 5 mg Q8hrs PRN.   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 Does get good relief with taking oxycodone 1-2 times a day and gabapentin 100 mg 1-2 times per day. Sent in escript to [facility] in [place]. Does not need refill on gabapentin at this time.  Patient will be continuing hyperbaric therapy into next week.  Has put on some weight.  Still gets most of his calories through liquids.  SO inquiring about any surgical procedure that may help to unlock his jaw as he is still only able to open it wide enough to fit one finger.  He is now seeing Dr. Lorenso Courier with oncology but has not been following up with him recently due to time constraints with hyperbaric therapy.  I could not answer question but will try to do some research.  Have encouraged them to ask with oncology, ENT, and/or radiation oncology to see if this is something they have encountered before and if there are any procedures to loosen his locked jaw.  Have set up in person appointment for 04/16/20 @ 3:30 Emeterio Balke K. Olena Heckle NP

## 2020-03-12 ENCOUNTER — Encounter (HOSPITAL_BASED_OUTPATIENT_CLINIC_OR_DEPARTMENT_OTHER): Payer: Medicaid Other | Admitting: Internal Medicine

## 2020-03-13 ENCOUNTER — Encounter: Payer: Medicaid Other | Admitting: Surgery

## 2020-03-13 ENCOUNTER — Other Ambulatory Visit: Payer: Medicaid Other

## 2020-03-13 ENCOUNTER — Encounter (HOSPITAL_BASED_OUTPATIENT_CLINIC_OR_DEPARTMENT_OTHER): Payer: Medicaid Other | Admitting: Physician Assistant

## 2020-03-14 ENCOUNTER — Encounter (HOSPITAL_BASED_OUTPATIENT_CLINIC_OR_DEPARTMENT_OTHER): Payer: Medicaid Other | Admitting: Internal Medicine

## 2020-03-15 ENCOUNTER — Encounter (HOSPITAL_BASED_OUTPATIENT_CLINIC_OR_DEPARTMENT_OTHER): Payer: Medicaid Other | Admitting: Internal Medicine

## 2020-03-18 ENCOUNTER — Encounter (HOSPITAL_BASED_OUTPATIENT_CLINIC_OR_DEPARTMENT_OTHER): Payer: Medicaid Other | Admitting: Internal Medicine

## 2020-03-20 ENCOUNTER — Encounter: Payer: Medicaid Other | Admitting: Surgery

## 2020-03-20 ENCOUNTER — Other Ambulatory Visit: Payer: Medicaid Other

## 2020-03-20 DIAGNOSIS — C024 Malignant neoplasm of lingual tonsil: Secondary | ICD-10-CM | POA: Diagnosis not present

## 2020-04-02 ENCOUNTER — Ambulatory Visit
Admission: RE | Admit: 2020-04-02 | Discharge: 2020-04-02 | Disposition: A | Discharge status: Home / Self Care | Payer: Medicaid Other | Source: Ambulatory Visit | Attending: Radiation Oncology | Admitting: Radiation Oncology

## 2020-04-02 ENCOUNTER — Other Ambulatory Visit: Payer: Self-pay

## 2020-04-02 VITALS — BP 126/81 | HR 64 | Temp 97.8°F | Resp 18 | Ht 75.0 in | Wt 205.5 lb

## 2020-04-02 DIAGNOSIS — F17210 Nicotine dependence, cigarettes, uncomplicated: Secondary | ICD-10-CM | POA: Insufficient documentation

## 2020-04-02 DIAGNOSIS — Z85818 Personal history of malignant neoplasm of other sites of lip, oral cavity, and pharynx: Secondary | ICD-10-CM | POA: Diagnosis not present

## 2020-04-02 DIAGNOSIS — Z923 Personal history of irradiation: Secondary | ICD-10-CM | POA: Diagnosis not present

## 2020-04-02 DIAGNOSIS — C090 Malignant neoplasm of tonsillar fossa: Secondary | ICD-10-CM

## 2020-04-02 MED ORDER — LARYNGOSCOPY SOLUTION RAD-ONC
15.0000 mL | Freq: Once | TOPICAL | Status: AC
  Administered 2020-04-02: 15 mL via TOPICAL
  Filled 2020-04-02: qty 15

## 2020-04-02 NOTE — Progress Notes (Signed)
Mr. [name] [name] presents for followup of radiation completed 12/28/18 to his tonsil.   Pain issues, if any: Ongoing sore throat that he manages with OTC Advil Using a feeding tube?: N/A Weight changes, if any:  Wt Readings from Last 3 Encounters:  04/02/20 205 lb 8 oz (93.2 kg)  01/12/20 195 lb 6.4 oz (88.6 kg)  11/08/19 189 lb 9.6 oz (86 kg)   Swallowing issues, if any: Patient denies any issues, but wife says patient is still struggling. She reports he has had to start using suction after meals and to help manage thick secretions.  Smoking or chewing tobacco? Occasional cigarette use ("a few a week") Using fluoride trays daily? Intermittent use but unable to use consistently because of range of motion restriction in jaw Last ENT visit was on: 10/02/2019 Saw Dr. Carol Ada (and resident Dr. Lennox Laity): "Concern for persistence after treatment, with what appears to be a fibrinous lesion that is present overlying left glossotonsillar sulcus extending to vallecula over prior cancer site. PET/CT shows persistent hypermetabolic (though decreased) activity post-treatment. 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 Issues with lock jaw and thick secretions (has home suction). Dry mouth continues and can only tolerate liquid diet. Intermittent swelling to left side of jaw/neck  Today's Vitals   04/02/20 1419  BP: 126/81  Pulse: 64  Resp: 18  Temp: 97.8 F (36.6 C)  TempSrc: Oral  SpO2: 98%  Weight: 205 lb 8 oz (93.2 kg)  Height: 6\' 3"  (1.905 m)  PainSc: 2   PainLoc:  Throat   Body mass index is 25.69 kg/m.

## 2020-04-02 NOTE — Progress Notes (Signed)
Radiation Oncology         [phone] ________________________________  Name: [name] [name] MRN: [id number]  Date: 04/02/2020  DOB: [birth year]  Follow-up outpatient, in person visit  CC: [name], MD  [name], MD  Diagnosis and Prior Radiotherapy:       ICD-10-CM   1. 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  He is doing better overall after completing HBO therapy this summer. Skin healed, pain improved, gaining weight. Still has significant trismus, still smoking.  NED on laryngoscopy in August with Dr Benjamine Mola. Lost to f/u w/ ENT at [facility].  Pain issues, if any: Ongoing sore throat that he manages with OTC Advil Using a feeding tube?: N/A Weight changes, if any:  Wt Readings from Last 3 Encounters:  04/02/20 205 lb 8 oz (93.2 kg)  01/12/20 195 lb 6.4 oz (88.6 kg)  11/08/19 189 lb 9.6 oz (86 kg)   Swallowing issues, if any: Patient denies any issues, but wife says patient is still struggling. She reports he has had to start using suction after meals and to help manage thick secretions.   Smoking or chewing tobacco? Occasional cigarette use ("a few a week")  Last ENT visit was on: 10/02/2019 Saw Dr. Carol Ada (and resident Dr. Lennox Laity): "Concern for persistence after treatment, with what appears to be a fibrinous lesion that is present overlying left glossotonsillar sulcus extending to vallecula over prior  cancer site. PET/CT shows persistent hypermetabolic (though decreased) activity post-treatment. 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 Issues with lock jaw and thick secretions (has home suction). Dry mouth continues and can only tolerate liquid diet. No PEG. Intermittent swelling to left side of jaw/neck  Today's Vitals   04/02/20 1419  BP: 126/81  Pulse: 64  Resp: 18  Temp: 97.8 F (36.6 C)  TempSrc: Oral  SpO2: 98%  Weight: 205 lb 8 oz (93.2 kg)  Height: 6\' 3"  (1.905 m)  PainSc: 2   PainLoc: Throat   Body mass index is 25.69 kg/m.  ALLERGIES:  has No Known Allergies.  Meds: Current Outpatient Medications  Medication Sig Dispense Refill  . cyclobenzaprine (FLEXERIL) 10 MG tablet TAKE 1 TABLET BY MOUTH THREE TIMES A DAY AS NEEDED FOR MUSCLE SPASMS 30 tablet 0  . gabapentin (NEURONTIN) 100 MG capsule Take 200 mg by mouth every 8 (eight) hours as needed.    Marland Kitchen glycopyrrolate (ROBINUL) 1 MG tablet Take 1-2 tablet by mouth every eight hours as needed take 1-2 tabs every eight hours as needed for secretions    . ibuprofen (  ADVIL) 200 MG tablet Take 600 mg by mouth every 6 (six) hours as needed for pain or fever.     . metoprolol succinate (TOPROL-XL) 25 MG 24 hr tablet TAKE 1 TABLET BY MOUTH EVERY DAY 90 tablet 1  . oxyCODONE (OXY IR/ROXICODONE) 5 MG immediate release tablet Take 5 mg by mouth every 8 (eight) hours as needed.    . pentoxifylline (TRENTAL) 400 MG CR tablet Take 400mg  tab daily x 1 week, then 400mg  BID; take with food. Upset stomach is rare on this  med, but stop if you have upset stomach. 60 tablet 5  . sodium fluoride (PREVIDENT 5000 PLUS) 1.1 % CREA dental cream Apply to tooth brush. Brush teeth for 2 minutes. Spit out excess. DO NOT rinse afterwards. Repeat nightly. 1 Tube prn  . vitamin E 180 MG (400 UNITS) capsule Take 400 IU daily x 1 week, then 400 IU BID 60 capsule 5  . warfarin (COUMADIN) 10 MG tablet Take 1 tablet of 10 mg warfarin daily except on Sundays, Thursdays and Saturdays, take only 1/2 tablet (5 mg). 72 tablet 2  . atorvastatin (LIPITOR) 20 MG tablet Take 1 tablet (20 mg total) by mouth daily. 30 tablet 11   No current facility-administered medications for this encounter.    Physical Findings: The patient is in no acute distress. Patient is alert and oriented. Wt Readings from Last 3 Encounters:  04/02/20 205 lb 8 oz (93.2 kg)  01/12/20 195 lb 6.4 oz (88.6 kg)  11/08/19 189 lb 9.6 oz (86 kg)    height is 6\' 3"  (1.905 m) and weight is 205 lb 8 oz (93.2 kg). His oral temperature is 97.8 F (36.6 C). His blood pressure is 126/81 and his pulse is 64. His respiration is 18 and oxygen saturation is 98%. .     General: Alert and oriented, in no acute distress HEENT: Head is normocephalic. Extraocular movements are intact. Oropharynx is difficult to see due to trismus.  White coating persists on tongue, dentition covered in tartar and plaque. Neck:No palpable cervical or supraclavicular lymphadenopathy. Significant fibrosis and mild edema in left cervical neck.  Abdomen: Soft, nontender, nondistended, with no rigidity or guarding. No PEG. Lymphatics: see Neck Exam Skin: No concerning lesions. Skin has healed with less dryness and hyperpigmentation compared to previous visit. Psychiatric: Judgment and insight are intact. Affect is appropriate.   PROCEDURE NOTE: After obtaining consent and anesthetizing the nasal cavity with topical lidocaine and phenylephrine, the flexible endoscope was introduced and passed through the  nasal cavity.  Nasopharynx, supraglottis, and glottic larynx are clear. Left inferior base of tongue demonstrates some edema and yellow mucous-like coating - see photos. He tolerated the procedure well.    Lab Findings: Lab Results  Component Value Date   WBC 5.0 01/12/2020   HGB 15.1 01/12/2020   HCT 46.1 01/12/2020   MCV 92.8 01/12/2020   PLT 212 01/12/2020    Lab Results  Component Value Date   TSH 3.259 01/12/2020    Radiographic Findings: No results found.  Impression/Plan:   This is a wonderful 59 year-old gentleman with a history of oropharyngeal cancer status post chemoradiation.  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  He is continuing Trental and vitamin E. He is doing better clinically after these treatments. Appreciate the involvement of Wound Care Team.  Will refer back to physical therapy for significant trismus. He needs to gain ROM in jaw for Therabite device.  He has  cut down on smoking but he does continue to smoke and knows that ultimately it is best that he stop smoking entirely.  Alcohol cessation also highly recommended.  Recommend ACT mouthwash with fluoride and using a pediatric tooth brush (cannot use adult brush for inner parts of teeth due to trismus). I've messaged Dr. Enrique Sack to see if he can f/u with patient for dental care.  He will continue to follow closely with Dr. Benjamine Mola of otolaryngology and medical oncology.  I will see him back for follow-up in 39mo. I'll notify the [facility] team of his progress in case they desire to see him, as well.   On date of service, in total, I spent 30 minutes on this encounter. Patient was seen in person.  _____________________________________   Eppie Gibson, MD

## 2020-04-03 ENCOUNTER — Encounter: Payer: Self-pay | Admitting: Radiation Oncology

## 2020-04-04 ENCOUNTER — Telehealth: Payer: Self-pay | Admitting: Radiation Oncology

## 2020-04-04 NOTE — Telephone Encounter (Signed)
Called pt to inform him of his 6 month follow up appointment scheduled for 10/04/20 @ 11:00 AM with Dr. Isidore Moos. [phone] is out of service. Successfully LVM @ [phone] for a return call.

## 2020-04-08 ENCOUNTER — Telehealth: Payer: Self-pay | Admitting: *Deleted

## 2020-04-08 NOTE — Telephone Encounter (Signed)
CALLED PATIENT TO ASK ABOUT HIS FU WITH DR. Isidore Moos, PATIENT REQUESTED FU ON Tuesday , APPT. MOVED TO 10-08-20 @ 2 PM, PATIENT AGREED TO NEW APPT. DATE AND TIME

## 2020-04-14 ENCOUNTER — Other Ambulatory Visit: Payer: Self-pay | Admitting: Hematology and Oncology

## 2020-04-14 DIAGNOSIS — C090 Malignant neoplasm of tonsillar fossa: Secondary | ICD-10-CM

## 2020-04-15 ENCOUNTER — Encounter: Payer: Self-pay | Admitting: Hematology and Oncology

## 2020-04-15 ENCOUNTER — Inpatient Hospital Stay: Payer: Medicaid Other | Attending: Hematology and Oncology

## 2020-04-15 ENCOUNTER — Other Ambulatory Visit: Payer: Self-pay

## 2020-04-15 ENCOUNTER — Inpatient Hospital Stay (HOSPITAL_BASED_OUTPATIENT_CLINIC_OR_DEPARTMENT_OTHER): Payer: Medicaid Other | Admitting: Hematology and Oncology

## 2020-04-15 VITALS — BP 122/96 | HR 92 | Temp 97.0°F | Resp 18 | Ht 75.0 in | Wt 208.6 lb

## 2020-04-15 DIAGNOSIS — Z87891 Personal history of nicotine dependence: Secondary | ICD-10-CM | POA: Diagnosis not present

## 2020-04-15 DIAGNOSIS — Z23 Encounter for immunization: Secondary | ICD-10-CM | POA: Diagnosis not present

## 2020-04-15 DIAGNOSIS — I82402 Acute embolism and thrombosis of unspecified deep veins of left lower extremity: Secondary | ICD-10-CM

## 2020-04-15 DIAGNOSIS — C099 Malignant neoplasm of tonsil, unspecified: Secondary | ICD-10-CM

## 2020-04-15 DIAGNOSIS — B370 Candidal stomatitis: Secondary | ICD-10-CM

## 2020-04-15 DIAGNOSIS — Z7901 Long term (current) use of anticoagulants: Secondary | ICD-10-CM | POA: Diagnosis not present

## 2020-04-15 DIAGNOSIS — C090 Malignant neoplasm of tonsillar fossa: Secondary | ICD-10-CM

## 2020-04-15 LAB — CBC WITH DIFFERENTIAL (CANCER CENTER ONLY)
Abs Immature Granulocytes: 0.01 10*3/uL (ref 0.00–0.07)
Basophils Absolute: 0 10*3/uL (ref 0.0–0.1)
Basophils Relative: 1 %
Eosinophils Absolute: 0.2 10*3/uL (ref 0.0–0.5)
Eosinophils Relative: 3 %
HCT: 44.2 % (ref 39.0–52.0)
Hemoglobin: 15.1 g/dL (ref 13.0–17.0)
Immature Granulocytes: 0 %
Lymphocytes Relative: 14 %
Lymphs Abs: 0.8 10*3/uL (ref 0.7–4.0)
MCH: 33.4 pg (ref 26.0–34.0)
MCHC: 34.2 g/dL (ref 30.0–36.0)
MCV: 97.8 fL (ref 80.0–100.0)
Monocytes Absolute: 0.7 10*3/uL (ref 0.1–1.0)
Monocytes Relative: 12 %
Neutro Abs: 4 10*3/uL (ref 1.7–7.7)
Neutrophils Relative %: 70 %
Platelet Count: 192 10*3/uL (ref 150–400)
RBC: 4.52 MIL/uL (ref 4.22–5.81)
RDW: 14.2 % (ref 11.5–15.5)
WBC Count: 5.7 10*3/uL (ref 4.0–10.5)
nRBC: 0 % (ref 0.0–0.2)

## 2020-04-15 LAB — CMP (CANCER CENTER ONLY)
ALT: 11 U/L (ref 0–44)
AST: 15 U/L (ref 15–41)
Albumin: 3.8 g/dL (ref 3.5–5.0)
Alkaline Phosphatase: 109 U/L (ref 38–126)
Anion gap: 6 (ref 5–15)
BUN: 12 mg/dL (ref 6–20)
CO2: 28 mmol/L (ref 22–32)
Calcium: 9.6 mg/dL (ref 8.9–10.3)
Chloride: 105 mmol/L (ref 98–111)
Creatinine: 0.81 mg/dL (ref 0.61–1.24)
GFR, Est AFR Am: >60 mL/min
GFR, Estimated: >60 mL/min
Glucose, Bld: 95 mg/dL (ref 70–99)
Potassium: 4 mmol/L (ref 3.5–5.1)
Sodium: 139 mmol/L (ref 135–145)
Total Bilirubin: 0.6 mg/dL (ref 0.3–1.2)
Total Protein: 7 g/dL (ref 6.5–8.1)

## 2020-04-15 LAB — TSH: TSH: 6.416 u[IU]/mL — ABNORMAL HIGH (ref 0.320–4.118)

## 2020-04-15 MED ORDER — INFLUENZA VAC SPLIT QUAD 0.5 ML IM SUSY
PREFILLED_SYRINGE | INTRAMUSCULAR | Status: AC
  Filled 2020-04-15: qty 0.5

## 2020-04-15 MED ORDER — CYCLOBENZAPRINE HCL 10 MG PO TABS: ORAL_TABLET | ORAL | 0 refills | Status: DC

## 2020-04-15 MED ORDER — INFLUENZA VAC SPLIT QUAD 0.5 ML IM SUSY
0.5000 mL | PREFILLED_SYRINGE | Freq: Once | INTRAMUSCULAR | Status: AC
  Administered 2020-04-15: 0.5 mL via INTRAMUSCULAR

## 2020-04-15 NOTE — Patient Instructions (Signed)
Pt given information regarding flu vaccine as it was given to him today

## 2020-04-15 NOTE — Progress Notes (Signed)
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 Left tonsil prominence with two Level II LN's (largest 2.2cm) and at least one Level IV LN (41mm);  no metastasisi ? Tonsil bx by Dr. Benjamine Mola, invasive SCCa, p16+; not a candidate for TORS -Late 10/2018 - 12/2018: definitive chemoradiation with weekly cisplatin  ? 03/2019: end-of-treatment PET showed decrease in FDG avidity in the left tonsil but some residual soft tissue fullness, resolution of left LN disease   -2021:  ? Diffuse pharyngeal soft tissue thickening at the tongue base on multiple CT's  ? Necrotic tissue on multiple biopsies at [facility], suggestive of radionecrosis; no evidence of malignancy   #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 -12/2018: acute DVT involving R peroneal vein and L gastrocnemius vein; new arterial thrombosis involving R common femoral artery and popliteal arteries, due to artery aneurysm -Late 12/2018: progression of acute DVT in the LLE from gastrocnemius vein to the femoral, popliteal, posterior tibial and peroneal veins despite being on Eliquis  -Currently on warfarin   Interval History:  [name] [name] 59 y.o. male with medical history significant for squamous cell carcinoma of left tonsil presents for a  follow up visit. The patient's last visit was on 01/12/2020 at which time he established care with Dr. Lorenso Courier. In the interim since the last visit Mr. 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   On exam today Mr. Virgin notes he feels improved from our last visit in June.  He notes that he is completed his hyperbaric oxygen and that it went "okay".  He notes that it did change his vision, however now that vision is almost back to normal.  He notes that he does continue to have some pain and tenderness in the left side of his face and it runs about two thirds up the length of his chin.  He reports that he continues to have thick's elevations and can only open his mouth about three fourths of an inch.  He is currently being scheduled and cancer rehab for jaw therapy in order to help with this.  He reports he has a consult with them on Thursday of this week.  On further discussion he notes the pain in his throat is not as bad as it was previously.  He notes it is now easier to swallow and that other than his "lockjaw" everything else is getting better.  He is requesting a refill on his Flexeril today.  He notes that his pain is well controlled and he is taking 1-2 oxycodones at night as well as throat massage in order to help with his pain.  He currently denies having any issues with fevers, chills, sweats, nausea, vomiting or diarrhea.  A full 10 point ROS is the blood.  MEDICAL HISTORY:  Past Medical History:  Diagnosis Date  . 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 Benign essential HTN 01/04/2012  . Cancer ([place])    tonsilar  . Chronic anticoagulation 01/02/2013  . Dermatofibroma of forearm 01/02/2013   Left side  . Hyperlipidemia, mixed 01/04/2012  . 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 Primary  hypercoagulable state ([place]) 10/01/2014  . Sinus bradycardia, chronic 01/04/2012  . Superficial thrombosis of lower extremity 05/02/2012    SURGICAL HISTORY: Past Surgical History:  Procedure Laterality Date  . DIRECT LARYNGOSCOPY Left 10/19/2018   Procedure: DIRECT LARYNGOSCOPY;  Surgeon: Leta Baptist, MD;  Location: [facility];  Service: ENT;  Laterality: Left;  . IR IMAGING GUIDED PORT INSERTION  11/04/2018  . IR REMOVAL TUN ACCESS W/ PORT W/O FL MOD SED  05/01/2019  . TONSILLECTOMY Left 10/19/2018   Procedure: BIOPSY OF LEFT TONSIL;  Surgeon: Leta Baptist, MD;  Location: [facility];  Service: ENT;  Laterality: Left;    SOCIAL HISTORY: Social History   Socioeconomic History  . Marital status: Divorced    Spouse name: Not on file  . Number of children: 2  . Years of education: Not on file  . Highest education level: Not on file  Occupational History  . Not on file  Tobacco Use  . Smoking status: Former Smoker    Packs/day: 0.50    Years: 30.00    Pack years: 15.00    Types: Cigarettes    Quit date: 10/19/2018    Years since quitting: 1.4  . Smokeless tobacco: Never Used  Vaping Use  . Vaping Use: Former  . Substances: Nicotine, Flavoring  . Devices: 1 cartridge q2-3 days, he tells me he is not using.   Substance and Sexual Activity  . Alcohol use: Yes    Alcohol/week: 13.0 - 14.0 standard drinks    Types: 12 Cans of beer, 1 - 2 Shots of liquor per week    Comment: 1-2 times per week.  . Drug use: No  . Sexual activity: Not on file  Other Topics Concern  . Not on file  Social History Narrative  . Not on file   Social Determinants of Health   Financial Resource Strain:   . Difficulty of Paying Living Expenses: Not on file  Food Insecurity:   . Worried About Charity fundraiser in the Last Year: Not on file  . Ran Out of Food in the Last Year: Not on file  Transportation Needs:   . Lack of Transportation (Medical): Not on file  . Lack of  Transportation (Non-Medical): Not on file  Physical Activity:   . Days of Exercise per Week: Not on file  . Minutes of Exercise per Session: Not on file  Stress:   . Feeling of Stress : Not on file  Social Connections:   . Frequency of Communication with Friends and Family: Not on file  . Frequency of Social Gatherings with Friends and Family: Not on file  . Attends Religious Services: Not on file  . Active Member of Clubs or Organizations: Not on file  . Attends Archivist Meetings: Not on file  . Marital Status: Not on file  Intimate Partner Violence:   . Fear of Current or Ex-Partner: Not on file  . Emotionally Abused: Not on file  . Physically Abused: Not on file  . Sexually Abused: Not on file    FAMILY  HISTORY: Family History  Problem Relation Age of Onset  . Stroke Father     ALLERGIES:  has No Known Allergies.  MEDICATIONS:  Current Outpatient Medications  Medication Sig Dispense Refill  . atorvastatin (LIPITOR) 20 MG tablet Take 1 tablet (20 mg total) by mouth daily. 30 tablet 11  . cyclobenzaprine (FLEXERIL) 10 MG tablet TAKE 1 TABLET BY MOUTH THREE TIMES A DAY AS NEEDED FOR MUSCLE SPASMS 30 tablet 0  . gabapentin (NEURONTIN) 100 MG capsule Take 200 mg by mouth every 8 (eight) hours as needed.    Marland Kitchen glycopyrrolate (ROBINUL) 1 MG tablet Take 1-2 tablet by mouth every eight hours as needed take 1-2 tabs every eight hours as needed for secretions (Patient not taking: Reported on 04/15/2020)    . ibuprofen (ADVIL) 200 MG tablet Take 600 mg by mouth every 6 (six) hours as needed for pain or fever.     . metoprolol succinate (TOPROL-XL) 25 MG 24 hr tablet TAKE 1 TABLET BY MOUTH EVERY DAY 90 tablet 1  . oxyCODONE (OXY IR/ROXICODONE) 5 MG immediate release tablet Take 5 mg by mouth every 8 (eight) hours as needed.    . pentoxifylline (TRENTAL) 400 MG CR tablet Take 400mg  tab daily x 1 week, then 400mg  BID; take with food. Upset stomach is rare on this med, but stop if  you have upset stomach. 60 tablet 5  . sodium fluoride (PREVIDENT 5000 PLUS) 1.1 % CREA dental cream Apply to tooth brush. Brush teeth for 2 minutes. Spit out excess. DO NOT rinse afterwards. Repeat nightly. 1 Tube prn  . vitamin E 180 MG (400 UNITS) capsule Take 400 IU daily x 1 week, then 400 IU BID 60 capsule 5  . warfarin (COUMADIN) 10 MG tablet Take 1 tablet of 10 mg warfarin daily except on Sundays, Thursdays and Saturdays, take only 1/2 tablet (5 mg). 72 tablet 2   Current Facility-Administered Medications  Medication Dose Route Frequency Provider Last Rate Last Admin  . influenza vac split quadrivalent PF (FLUARIX) injection 0.5 mL  0.5 mL Intramuscular Once Orson Slick, MD        REVIEW OF SYSTEMS:   Constitutional: ( - ) fevers, ( - )  chills , ( - ) night sweats Eyes: ( - ) blurriness of vision, ( - ) double vision, ( - ) watery eyes Ears, nose, mouth, throat, and face: ( - ) mucositis, ( - ) sore throat Respiratory: ( - ) cough, ( - ) dyspnea, ( - ) wheezes Cardiovascular: ( - ) palpitation, ( - ) chest discomfort, ( - ) lower extremity swelling Gastrointestinal:  ( - ) nausea, ( - ) heartburn, ( - ) change in bowel habits Skin: ( - ) abnormal skin rashes Lymphatics: ( - ) new lymphadenopathy, ( - ) easy bruising Neurological: ( - ) numbness, ( - ) tingling, ( - ) new weaknesses Behavioral/Psych: ( - ) mood change, ( - ) new changes  All other systems were reviewed with the patient and are negative.  PHYSICAL EXAMINATION: ECOG PERFORMANCE STATUS: 1 - Symptomatic but completely ambulatory  Vitals:   04/15/20 1526  BP: (!) 122/96  Pulse: 92  Resp: 18  Temp: (!) 97 F (36.1 C)  SpO2: 98%   Filed Weights   04/15/20 1526  Weight: 208 lb 9.6 oz (94.6 kg)    GENERAL: well appearing middle aged Caucasian male alert, no distress and comfortable SKIN: skin color, texture, turgor are normal, no rashes  or significant lesions EYES: conjunctiva are pink and non-injected,  sclera clear OROPHARYNX: exam limited as patient cannot open his mouth wide. Thick salivations noted  LUNGS: clear to auscultation and percussion with normal breathing effort HEART: regular rate & rhythm and no murmurs and no lower extremity edema Musculoskeletal: no cyanosis of digits and no clubbing  PSYCH: alert & oriented x 3, fluent speech NEURO: no focal motor/sensory deficits  LABORATORY DATA:  I have reviewed the data as listed CBC Latest Ref Rng & Units 04/15/2020 01/12/2020 10/18/2019  WBC 4.0 - 10.5 K/uL 5.7 5.0 4.8  Hemoglobin 13.0 - 17.0 g/dL 15.1 15.1 12.1(L)  Hematocrit 39 - 52 % 44.2 46.1 38.7(L)  Platelets 150 - 400 K/uL 192 212 266    CMP Latest Ref Rng & Units 04/15/2020 01/12/2020 10/18/2019  Glucose 70 - 99 mg/dL 95 84 97  BUN 6 - 20 mg/dL 12 8 10   Creatinine 0.61 - 1.24 mg/dL 0.81 0.86 0.74  Sodium 135 - 145 mmol/L 139 140 140  Potassium 3.5 - 5.1 mmol/L 4.0 4.3 4.1  Chloride 98 - 111 mmol/L 105 103 100  CO2 22 - 32 mmol/L 28 27 30   Calcium 8.9 - 10.3 mg/dL 9.6 9.5 9.7  Total Protein 6.5 - 8.1 g/dL 7.0 6.9 6.9  Total Bilirubin 0.3 - 1.2 mg/dL 0.6 0.3 0.4  Alkaline Phos 38 - 126 U/L 109 101 94  AST 15 - 41 U/L 15 11(L) 11(L)  ALT 0 - 44 U/L 11 9 7     RADIOGRAPHIC STUDIES: No results found.  ASSESSMENT & PLAN LANNIS LICHTENWALNER 59 y.o. male with medical history significant for squamous cell carcinoma of left tonsil presents for a follow up visit.  After review the labs, discussion with the patient, and review of the prior records the patient's findings are most consistent with squamous cell carcinoma of the left tonsil status post definitive treatment with chemoradiation.  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  The patient underwent hyperbaric oxygen therapy for this lesion.  Previously I stressed the importance of continuing routine follow-up with ENT/RadOnc for direct visualization of his  malignancy site.  We will continue to monitor and help with symptom management during this posttreatment surveillance period.   On exam today Mr. Shin is to be improving.  We are in agreement with rehab for his jaw.  He has had significant weight gain and all of his labs are currently stable today.  Overall other than his issues with the jaw he is making an excellent recovery.  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.  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 After that will have RTC in 6 months (stagger visits with Rad/Onc).   #Recurrent LLE DVT - Currently on warfarin, no overt signs of bleeding/bruising.  - Continue follow-up with anticoagulation clinic  - Duration of anticoagulation is lifelong   #Persistent oral candidiasis -Despite aggressive antifungal therapy, including amphotericin, there was no  significant improvement in oral candidiasis -Infectious disease felt that the oral candidiasis was noninfectious, and therefore does not warrant any further treatment at this time -I counseled the patient on the importance of maintaining good oral hygiene            No orders of the defined types were placed in this encounter.   All questions were answered. The patient knows to call the clinic with any problems, questions or concerns.  A total of more than 30 minutes were spent on this encounter and over half of that time was spent on counseling and coordination of care as outlined above.   Ledell Peoples, MD Department of Hematology/Oncology [place] [facility] Phone: [phone] Pager: [phone] Email: [email or web address]  04/15/2020 3:43 PM

## 2020-04-16 ENCOUNTER — Other Ambulatory Visit: Payer: Medicaid Other | Admitting: Adult Health Nurse Practitioner

## 2020-04-16 ENCOUNTER — Other Ambulatory Visit: Payer: Self-pay

## 2020-04-16 DIAGNOSIS — Z515 Encounter for palliative care: Secondary | ICD-10-CM

## 2020-04-16 DIAGNOSIS — C090 Malignant neoplasm of tonsillar fossa: Secondary | ICD-10-CM

## 2020-04-16 NOTE — Progress Notes (Signed)
[facility] Consult Note Telephone: [phone]  Fax: [phone]  PATIENT NAME: [name] [name] DOB: 11-Feb-1961 MRN: [id number]  PRIMARY CARE PROVIDER:   [name], MD  REFERRING PROVIDER:  [name], MD [address] [place],  [place] [address]  RESPONSIBLE PARTY:   self [place], [place] H: [phone] C: [phone]    RECOMMENDATIONS and PLAN:  1.  Advanced care planning. Full code  2.  Pain.  Patient still gets pain of the jaw especially on the left side.  States that he can now open his mouth about 5/8 of an inch.  States that now that the hyperbaric therapy is finished he will be starting oncology rehab again next week to work on his jaw.  Still gets relief with 1-2 oxycodone 5 mg/day and gabapentin 100 mg 1-2 times per day.  Patient also takes Advil as needed.  Did discuss that as he starts therapy on his jaw he may have some increased pain at first.  Patient needed refill on oxycodone today and this was sent to [facility] in [place].  Continue current pain regimen and work with oncology rehab as ordered.  3.  Functional status.  Patient is independent of ADLs.  Requires no assistive devices for ambulation.  Over the summer he did aquatic exercises to incorporate low impact exercise into his lifestyle.  Now he is trying to go for more walks.  Continue healthy lifestyle changes.  4.  Nutritional status.  Despite the limitations on opening his jaw he has been able to eat more and has gained weight up to 208 pounds with BMI of 26.07.  This is a weight gain of approximately 20 pounds.  He has to slowly chew his food and it does have to be cut up into small pieces.  Continue supportive care at home.  Palliative will continue to monitor for symptom management and make recommendations as needed.  Patient is doing well and is slowly improving from radiation necrosis.  Very encouraging that he is losing  weight.  Encouraged to call with any concerns and will schedule appointment as needed.  I spent 50 minutes providing this consultation,  from 3:30 to 4:20 including time with patient, chart review, provider coordination, documentation. More than 50% of the time in this consultation was spent coordinating communication.   HISTORY OF PRESENT ILLNESS:  [name] [name] is a 59 y.o. year old male with multiple medical problems including tonsillar cancer,HTN, HLD, h/o DVT and PE on coumadin. Palliative Care was asked to help address goals of care.  Patient is finished hyperbaric oxygen therapy.  Radiation oncologist was able to put a scope down his throat and did not see any signs of cancer.  Still has sloughing of mucosa but states that this has lessened and does not have to suction as often.  Denies fever, dizziness, shortness of breath, cough, chest pain, N/V/D, constipation.  CODE STATUS: full code  PPS: 50% HOSPICE ELIGIBILITY/DIAGNOSIS: TBD  PHYSICAL EXAM:  BP 120/70  HR 61  O2 96% on RA General: patient in NAD Cardiovascular: regular rate and rhythm Pulmonary: Lung sounds clear; normal respiratory effort Abdomen: soft, nontender, + bowel sounds GU: no suprapubic tenderness Extremities: no edema, no joint deformities Skin: no rashes on exposed skin Neurological: Weakness but otherwise nonfocal   PAST MEDICAL HISTORY:  Past Medical History:  Diagnosis Date  . 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 Benign essential HTN 01/04/2012  . Cancer ([place])    tonsilar  . Chronic anticoagulation 01/02/2013  . Dermatofibroma of forearm 01/02/2013   Left side  . Hyperlipidemia, mixed 01/04/2012  . 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 Primary hypercoagulable state ([place]) 10/01/2014  . Sinus bradycardia, chronic 01/04/2012  .  Superficial thrombosis of lower extremity 05/02/2012    SOCIAL HX:  Social History   Tobacco Use  . Smoking status: Former Smoker    Packs/day: 0.50    Years: 30.00    Pack years: 15.00    Types: Cigarettes    Quit date: 10/19/2018    Years since quitting: 1.4  . Smokeless tobacco: Never Used  Substance Use Topics  . Alcohol use: Yes    Alcohol/week: 13.0 - 14.0 standard drinks    Types: 12 Cans of beer, 1 - 2 Shots of liquor per week    Comment: 1-2 times per week.    ALLERGIES: No Known Allergies   PERTINENT MEDICATIONS:  Outpatient Encounter Medications as of 04/16/2020  Medication Sig  . atorvastatin (LIPITOR) 20 MG tablet Take 1 tablet (20 mg total) by mouth daily.  . cyclobenzaprine (FLEXERIL) 10 MG tablet TAKE 1 TABLET BY MOUTH THREE TIMES A DAY AS NEEDED FOR MUSCLE SPASMS  . gabapentin (NEURONTIN) 100 MG capsule Take 200 mg by mouth every 8 (eight) hours as needed.  Marland Kitchen glycopyrrolate (ROBINUL) 1 MG tablet Take 1-2 tablet by mouth every eight hours as needed take 1-2 tabs every eight hours as needed for secretions (Patient not taking: Reported on 04/15/2020)  . ibuprofen (ADVIL) 200 MG tablet Take 600 mg by mouth every 6 (six) hours as needed for pain or fever.   . metoprolol succinate (TOPROL-XL) 25 MG 24 hr tablet TAKE 1 TABLET BY MOUTH EVERY DAY  . oxyCODONE (OXY IR/ROXICODONE) 5 MG immediate release tablet Take 5 mg by mouth every 8 (eight) hours as needed.  . pentoxifylline (TRENTAL) 400 MG CR tablet Take 400mg  tab daily x 1 week, then 400mg  BID; take with food. Upset stomach is rare on this med, but stop if you have upset stomach.  . sodium fluoride (PREVIDENT 5000 PLUS) 1.1 % CREA dental cream Apply to tooth brush. Brush teeth for 2 minutes. Spit out excess. DO NOT rinse afterwards. Repeat nightly.  . vitamin E 180 MG (400 UNITS) capsule Take 400 IU daily x 1 week, then 400 IU BID  . warfarin (COUMADIN) 10 MG tablet Take 1 tablet of 10 mg warfarin daily except on Sundays,  Thursdays and Saturdays, take only 1/2 tablet (5 mg).   No facility-administered encounter medications on file as of 04/16/2020.     Rajon Bisig Jenetta Downer, NP

## 2020-04-18 ENCOUNTER — Encounter: Payer: Self-pay | Admitting: Physical Therapy

## 2020-04-18 ENCOUNTER — Other Ambulatory Visit: Payer: Self-pay

## 2020-04-18 ENCOUNTER — Ambulatory Visit: Payer: Medicaid Other | Attending: Radiation Oncology | Admitting: Physical Therapy

## 2020-04-18 ENCOUNTER — Telehealth: Payer: Self-pay | Admitting: Hematology and Oncology

## 2020-04-18 DIAGNOSIS — I890 Lymphedema, not elsewhere classified: Secondary | ICD-10-CM | POA: Diagnosis not present

## 2020-04-18 DIAGNOSIS — L599 Disorder of the skin and subcutaneous tissue related to radiation, unspecified: Secondary | ICD-10-CM | POA: Insufficient documentation

## 2020-04-18 DIAGNOSIS — M542 Cervicalgia: Secondary | ICD-10-CM

## 2020-04-18 DIAGNOSIS — R293 Abnormal posture: Secondary | ICD-10-CM | POA: Diagnosis not present

## 2020-04-18 DIAGNOSIS — M6249 Contracture of muscle, multiple sites: Secondary | ICD-10-CM | POA: Diagnosis not present

## 2020-04-18 DIAGNOSIS — C090 Malignant neoplasm of tonsillar fossa: Secondary | ICD-10-CM | POA: Diagnosis not present

## 2020-04-18 NOTE — Telephone Encounter (Signed)
Scheduled per los. Called and left msg. Mailed printout  °

## 2020-04-18 NOTE — Therapy (Signed)
[place], [place], [address] Phone: [phone]   Fax:  [phone]  Physical Therapy Evaluation  Patient Details  Name: [name] [name] MRN: [id number] Date of Birth: [birth year] Referring Provider (PT): Reita May Date: 04/18/2020   PT End of Session - 04/18/20 1449    Visit Number 1    Number of Visits 9    Date for PT Re-Evaluation 05/16/20    Authorization Type [employer]    Authorization Time Period 1 of 27    Authorization - Visit Number 1    Authorization - Number of Visits 9    Progress Note Due on Visit 0.02    PT Start Time 1408    PT Stop Time 1448    PT Time Calculation (min) 40 min    Activity Tolerance Patient tolerated treatment well    Behavior During Therapy WFL for tasks assessed/performed           Past Medical History:  Diagnosis Date  . 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 Benign essential HTN 01/04/2012  . Cancer ([place])    tonsilar  . Chronic anticoagulation 01/02/2013  . Dermatofibroma of forearm 01/02/2013   Left side  . Hyperlipidemia, mixed 01/04/2012  . 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 Primary hypercoagulable state ([place]) 10/01/2014  . Sinus bradycardia, chronic 01/04/2012  . Superficial thrombosis of lower extremity 05/02/2012    Past Surgical History:  Procedure Laterality Date  . DIRECT LARYNGOSCOPY Left 10/19/2018   Procedure: DIRECT LARYNGOSCOPY;  Surgeon: Leta Baptist, MD;  Location: [facility];  Service: ENT;  Laterality: Left;  . IR IMAGING GUIDED PORT INSERTION  11/04/2018  . IR REMOVAL TUN ACCESS W/ PORT W/O FL MOD SED  05/01/2019  . TONSILLECTOMY Left 10/19/2018   Procedure: BIOPSY OF LEFT TONSIL;  Surgeon: Leta Baptist, MD;  Location: [facility];  Service: ENT;  Laterality: Left;    There were no vitals filed for this visit.    Subjective Assessment - 04/18/20 1414    Subjective Pt reports coming to therapy today secondary to his jaw locking up.  Not using his flexi touch much anymore.  Swelling is better but skin is very firm and can not open his mouth very far.  Had hyperbaric treatment but it bothered his vision.  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 No/denies    Pain Score 0-No pain              [facility] PT Assessment - 04/18/20 0001      Assessment   Medical Diagnosis L tonsil squamous cell carcinoma    Referring Provider (PT) Isidore Moos    Onset Date/Surgical Date 10/19/18    Hand Dominance Right    Prior Therapy Was seen at this clinic for several visits for jaw pain      Precautions   Precautions None      Restrictions   Weight Bearing Restrictions No  Balance Screen   Has the patient fallen in the past 6 months No    Has the patient had a decrease in activity level because of a fear of falling?  No    Is the patient reluctant to leave their home because of a fear of falling?  No      Home Environment   Living Environment Private residence    Living Arrangements Spouse/significant other    Available Help at Discharge Family      Prior Function   Level of Independence Independent      Cognition   Overall Cognitive Status Within Functional Limits for tasks assessed      Posture/Postural Control   Posture/Postural Control Postural limitations    Postural Limitations Rounded Shoulders;Forward head      AROM   Cervical Flexion 64    Cervical Extension 30    Cervical - Right Side Bend 50    Cervical - Left Side Bend 50    Cervical -  Right Rotation 42    Cervical - Left Rotation 65    Jaw-Incisal Opening  15    Jaw-Left Lateral Excurison 8    Jaw-Right Lateral Excursion 3      Palpation   Palpation comment muscle surrounding left jaw extremely tender and significant tightness in SCM             LYMPHEDEMA/ONCOLOGY QUESTIONNAIRE - 04/18/20 0001      Treatment   Active Chemotherapy Treatment No    Past Chemotherapy Treatment Yes    Active Radiation Treatment No    Past Radiation Treatment Yes      Head and Neck   4 cm superior to sternal notch around neck 42.2 cm    6 cm superior to sternal notch around neck 42.1 cm    8 cm superior to sternal notch around neck 41.6 cm    Other --   increased numbers possibly secondary to sig. wt. gain.  Does                  Objective measurements completed on examination: See above findings.               PT Education - 04/18/20 1448    Education Details Educated pt to bring thera-bite next visit    Methods Explanation    Comprehension Verbalized understanding            PT Short Term Goals - 04/18/20 1524      PT SHORT TERM GOAL #1   Title Pt. will be independent with HEP to improve jaw ROM and pain    Time 2    Period Weeks    Status New    Target Date 05/02/20      PT SHORT TERM GOAL #2   Title Decreased tissue tension left SCM/left jaw    Time 2    Period Weeks    Status New    Target Date 05/02/20      PT SHORT TERM GOAL #3   Title Pt. will have increased jaw opening to 23 mm for improved eating ability    Baseline 15    Time 2    Period Weeks             PT Long Term Goals - 04/18/20 1519      PT LONG TERM GOAL #1   Title Pt. will have jaw opening of 30 mm for improved ability to eat    Baseline  15 mm    Time 4    Period Weeks    Status New    Target Date 05/16/20      PT LONG TERM GOAL #2   Title Right lateral jaw excursion will be equal to left (8 mm) or better    Baseline 3    Time 4    Period Weeks      Status New    Target Date 05/16/20      PT LONG TERM GOAL #3   Title Left jaw pain/tenderness improved by 50% or more    Baseline 5/10    Time 4    Period Weeks    Status New    Target Date 05/16/20                  Plan - 04/18/20 1457    Clinical Impression Statement Pt. returns for therapy with complaints of left jaw pain and limitations in opening opening his mouth affecting speech and eating.  He has significant tightness in SCM and lateral jaw, as well as limitations in cervical ROM.  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, radiation fibrosis and radiation necrosis    Examination-Activity Limitations Self Feeding    Stability/Clinical Decision Making Stable/Uncomplicated    Clinical Decision Making Low    Rehab Potential Good    PT Frequency 2x / week    PT Duration 4 weeks    PT Treatment/Interventions ADLs/Self Care Home Management;Therapeutic exercise;Manual lymph drainage;Manual techniques;Taping;Patient/family education;Passive range of motion;Joint Manipulations;Compression bandaging    PT Next Visit Plan Manual therapy to neck and jaw, assess therabite, HEP for cervical/jaw ROM    PT Home Exercise Plan continue therabite    Recommended Other Services Be seen for 2 weeks, and if not improved consider specialty PT or dry needling    Consulted and Agree with Plan of Care Patient           Patient will benefit from skilled therapeutic intervention in order to improve the following deficits and impairments:  Postural dysfunction, Decreased strength, Increased edema, Decreased knowledge of precautions, Pain, Increased fascial restricitons, Decreased range of motion, Increased muscle spasms  Visit Diagnosis: Contracture of muscle, multiple sites  Cervicalgia  Abnormal posture  Disorder of the skin and subcutaneous tissue  related to radiation, unspecified  Lymphedema, not elsewhere classified  Malignant neoplasm of tonsillar fossa [facility])     Problem List Patient Active Problem List   Diagnosis Date Noted  . Encounter for cardiac risk counseling 11/08/2019  . Hypokalemia 08/21/2019  . Warfarin anticoagulation 02/20/2019  . Hypercholesteremia 02/07/2019  . Recurrent deep vein thrombosis (DVT) ([place]) 02/06/2019  . Thoracic ascending aortic aneurysm ([place]) 02/06/2019  . Port-A-Cath in place 01/18/2019  . Mucositis due to chemotherapy 12/07/2018  . Hypomagnesemia 12/07/2018  . Thrush 12/07/2018  . Malignant neoplasm of tonsillar fossa ([place]) 10/17/2018  . 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 DVT, lower extremity, proximal ([place]) 05/24/2013  . Chronic anticoagulation 01/02/2013  . Superficial thrombosis of lower extremity 05/02/2012  . Benign essential HTN 01/04/2012  . Hyperlipidemia, mixed 01/04/2012    Allyson Sabal [facility] 04/18/2020, 3:59 PM  [place], [place], [address] Phone: [phone]   Fax:  [phone]  Name: [name]  NICOLAE [name] MRN: [id number] Date of Birth: 11-08-1960  Manus Gunning, PT 04/18/20 4:00 PM

## 2020-04-19 DIAGNOSIS — C024 Malignant neoplasm of lingual tonsil: Secondary | ICD-10-CM | POA: Diagnosis not present

## 2020-04-24 ENCOUNTER — Ambulatory Visit: Payer: Medicaid Other | Attending: Radiation Oncology

## 2020-04-24 ENCOUNTER — Other Ambulatory Visit: Payer: Self-pay

## 2020-04-24 DIAGNOSIS — R293 Abnormal posture: Secondary | ICD-10-CM | POA: Insufficient documentation

## 2020-04-24 DIAGNOSIS — M6249 Contracture of muscle, multiple sites: Secondary | ICD-10-CM | POA: Diagnosis not present

## 2020-04-24 DIAGNOSIS — L599 Disorder of the skin and subcutaneous tissue related to radiation, unspecified: Secondary | ICD-10-CM | POA: Diagnosis not present

## 2020-04-24 DIAGNOSIS — I890 Lymphedema, not elsewhere classified: Secondary | ICD-10-CM | POA: Diagnosis not present

## 2020-04-24 DIAGNOSIS — C090 Malignant neoplasm of tonsillar fossa: Secondary | ICD-10-CM | POA: Diagnosis not present

## 2020-04-24 DIAGNOSIS — R1310 Dysphagia, unspecified: Secondary | ICD-10-CM | POA: Insufficient documentation

## 2020-04-24 DIAGNOSIS — M542 Cervicalgia: Secondary | ICD-10-CM | POA: Insufficient documentation

## 2020-04-24 NOTE — Therapy (Signed)
[place], [place], [address] Phone: [phone]   Fax:  [phone]  Physical Therapy Treatment  Patient Details  Name: [name] [name] MRN: [id number] Date of Birth: [birth year] Referring Provider (PT): Reita May Date: 04/24/2020   PT End of Session - 04/24/20 1620    Visit Number 2    Number of Visits 9    Date for PT Re-Evaluation 05/16/20    Authorization Type [employer]    Authorization - Visit Number 2    Authorization - Number of Visits 9    PT Start Time 1510    PT Stop Time 1600    PT Time Calculation (min) 50 min    Activity Tolerance Patient tolerated treatment well    Behavior During Therapy [facility] for tasks assessed/performed           Past Medical History:  Diagnosis Date   Aneurysm artery, 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   Right 1st seen 11/14; thrombosed 11/15   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   Right SFA 05/07/17 while on warfarin INR 2.9   Benign essential HTN 01/04/2012   Cancer ([place])    tonsilar   Chronic anticoagulation 01/02/2013   Dermatofibroma of forearm 01/02/2013   Left side   Hyperlipidemia, mixed 01/04/2012   Polycythemia secondary to smoking 01/04/2012   Primary hypercoagulable state ([place]) 10/01/2014   Sinus bradycardia, chronic 01/04/2012   Superficial thrombosis of lower extremity 05/02/2012    Past Surgical History:  Procedure Laterality Date   DIRECT LARYNGOSCOPY Left 10/19/2018   Procedure: DIRECT LARYNGOSCOPY;  Surgeon: Leta Baptist, MD;  Location: [facility];  Service: ENT;  Laterality: Left;   IR IMAGING GUIDED PORT INSERTION  11/04/2018   IR REMOVAL TUN ACCESS W/ PORT W/O FL MOD SED  05/01/2019   TONSILLECTOMY Left 10/19/2018   Procedure: BIOPSY OF LEFT TONSIL;  Surgeon: Leta Baptist, MD;  Location: [place];  Service: ENT;  Laterality: Left;    There were no vitals filed  for this visit.   Subjective Assessment - 04/24/20 1510    Subjective Still feeling really tight around his jaw, and still having trouble opening his mouth.  Forgot to bring therabite today.                             [facility] Adult PT Treatment/Exercise - 04/24/20 0001      Manual Therapy   Soft tissue mobilization Soft tissue to bilateral UT/levator/SCM, scalenes,and left jaw/TMJ area to improve flexibility and jaw opening. suboccipital release techniques.    Passive ROM Bilateral Rot and SB      Neck Exercises: Stretches   Upper Trapezius Stretch 2 reps;30 seconds    Other Neck Stretches right and left rot , back bending 10 reps    Other Neck Stretches sitting cervical retractions x 10                  PT Education - 04/24/20 1608    Education Details Pt educated in cervical retractions, bilateral rotation, backward bending, and bilateral UT stretching .  Given medbridge instructions modified by PT to 1 set of 10 reps for all except UT stretch 3 x 30 secs.    Person(s) Educated Patient    Methods Explanation;Demonstration    Comprehension Verbalized understanding;Returned demonstration;Need further instruction  PT Short Term Goals - 04/18/20 1524      PT SHORT TERM GOAL #1   Title Pt. will be independent with HEP to improve jaw ROM and pain    Time 2    Period Weeks    Status New    Target Date 05/02/20      PT SHORT TERM GOAL #2   Title Decreased tissue tension left SCM/left jaw    Time 2    Period Weeks    Status New    Target Date 05/02/20      PT SHORT TERM GOAL #3   Title Pt. will have increased jaw opening to 23 mm for improved eating ability    Baseline 15    Time 2    Period Weeks             PT Long Term Goals - 04/18/20 1519      PT LONG TERM GOAL #1   Title Pt. will have jaw opening of 30 mm for improved ability to eat    Baseline 15 mm    Time 4    Period Weeks    Status New    Target Date 05/16/20       PT LONG TERM GOAL #2   Title Right lateral jaw excursion will be equal to left (8 mm) or better    Baseline 3    Time 4    Period Weeks    Status New    Target Date 05/16/20      PT LONG TERM GOAL #3   Title Left jaw pain/tenderness improved by 50% or more    Baseline 5/10    Time 4    Period Weeks    Status New    Target Date 05/16/20                 Plan - 04/24/20 1622    Clinical Impression Statement Pt. responded well to therapy and noted some improvements in feelings of tightness.  Cervical ROM for rotation was visibly improved.  He had a trigger point in left jaw radiating toward right ear.  No noticeable changes in mouth opening.  He did not bring therabite with him but was advised to bring next visit.    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, radiation fibrosis and radiation necrosis    Examination-Activity Limitations Self Feeding    Stability/Clinical Decision Making Stable/Uncomplicated    Clinical Decision Making Low           Patient will benefit from skilled therapeutic intervention in order to improve the following deficits and impairments:  Postural dysfunction, Decreased strength, Increased edema, Decreased knowledge of precautions, Pain, Increased fascial restricitons, Decreased range of motion, Increased muscle spasms  Visit Diagnosis: Contracture of muscle, multiple sites  Dysphagia, unspecified type  Abnormal posture  Cervicalgia  Disorder of the skin and subcutaneous tissue related to radiation, unspecified  Lymphedema, not elsewhere classified  Malignant neoplasm of tonsillar fossa [facility])     Problem List Patient Active Problem List   Diagnosis Date Noted   Encounter for cardiac risk counseling 11/08/2019   Hypokalemia 08/21/2019   Warfarin anticoagulation 02/20/2019   Hypercholesteremia 02/07/2019   Recurrent deep vein thrombosis (DVT) ([place]) 02/06/2019    Thoracic ascending aortic aneurysm ([place]) 02/06/2019   Port-A-Cath in place 01/18/2019   Mucositis due to chemotherapy 12/07/2018   Hypomagnesemia 12/07/2018   Thrush 12/07/2018   Malignant  neoplasm of tonsillar fossa ([place]) 10/17/2018   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

## 2020-04-24 NOTE — Patient Instructions (Signed)
Access Code: [email or web address]

## 2020-04-29 ENCOUNTER — Other Ambulatory Visit: Payer: Self-pay

## 2020-04-29 ENCOUNTER — Ambulatory Visit: Payer: Medicaid Other

## 2020-04-29 DIAGNOSIS — M6249 Contracture of muscle, multiple sites: Secondary | ICD-10-CM | POA: Diagnosis not present

## 2020-04-29 DIAGNOSIS — L599 Disorder of the skin and subcutaneous tissue related to radiation, unspecified: Secondary | ICD-10-CM | POA: Diagnosis not present

## 2020-04-29 DIAGNOSIS — C090 Malignant neoplasm of tonsillar fossa: Secondary | ICD-10-CM | POA: Diagnosis not present

## 2020-04-29 DIAGNOSIS — R1310 Dysphagia, unspecified: Secondary | ICD-10-CM

## 2020-04-29 DIAGNOSIS — M542 Cervicalgia: Secondary | ICD-10-CM | POA: Diagnosis not present

## 2020-04-29 DIAGNOSIS — R293 Abnormal posture: Secondary | ICD-10-CM | POA: Diagnosis not present

## 2020-04-29 DIAGNOSIS — I890 Lymphedema, not elsewhere classified: Secondary | ICD-10-CM | POA: Diagnosis not present

## 2020-04-29 NOTE — Therapy (Signed)
[place], [place], [address] Phone: [phone]   Fax:  [phone]  Physical Therapy Treatment  Patient Details  Name: [name] [name] MRN: [id number] Date of Birth: [birth year] Referring Provider (PT): Reita May Date: 04/29/2020   PT End of Session - 04/29/20 1658    Visit Number 3    Number of Visits 9    Date for PT Re-Evaluation 05/16/20    Authorization Type [employer]    Authorization Time Period 3 of 27    Authorization - Visit Number 3    Authorization - Number of Visits 9    Progress Note Due on Visit 0.02    PT Start Time 1606    PT Stop Time 1650    PT Time Calculation (min) 44 min    Activity Tolerance Patient tolerated treatment well    Behavior During Therapy WFL for tasks assessed/performed           Past Medical History:  Diagnosis Date  . 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 Benign essential HTN 01/04/2012  . Cancer ([place])    tonsilar  . Chronic anticoagulation 01/02/2013  . Dermatofibroma of forearm 01/02/2013   Left side  . Hyperlipidemia, mixed 01/04/2012  . 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 Primary hypercoagulable state ([place]) 10/01/2014  . Sinus bradycardia, chronic 01/04/2012  . Superficial thrombosis of lower extremity 05/02/2012    Past Surgical History:  Procedure Laterality Date  . DIRECT LARYNGOSCOPY Left 10/19/2018   Procedure: DIRECT LARYNGOSCOPY;  Surgeon: Leta Baptist, MD;  Location: [facility];  Service: ENT;  Laterality: Left;  . IR IMAGING GUIDED PORT INSERTION  11/04/2018  . IR REMOVAL TUN ACCESS W/ PORT W/O FL MOD SED  05/01/2019  . TONSILLECTOMY Left 10/19/2018   Procedure: BIOPSY OF LEFT TONSIL;  Surgeon: Leta Baptist, MD;  Location: [facility];  Service: ENT;  Laterality: Left;    There were no vitals filed for this visit.   Subjective Assessment - 04/29/20 1607    Subjective Still feeling really tight around jaw and with trouble opening his mouth.  Has been bothered by his right eye since yesterday and if feels like there is something in it. Flushed it with saline but it still hurts.  Advised to see MD as soon as he leaves here.   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 Yes    Pain Score 2     Pain Location Jaw    Pain Descriptors / Indicators Tightness    Pain Onset More than a month ago    Pain Frequency Constant    Pain Relieving Factors advil                             [facility] Adult PT Treatment/Exercise - 04/29/20 0001      Manual Therapy   Soft tissue mobilization Soft tissue to bilateral UT/levator/SCM, scalenes,and left jaw/TMJ area to improve flexibility and jaw opening. suboccipital release techniques.    Myofascial Release MFR left lateral neck    Passive ROM Bilateral cervical  Rot and SB      Neck Exercises: Stretches   Upper Trapezius Stretch 2 reps;30 seconds    Other Neck Stretches jaw opening with slight overpressure, protrusion, backbending cervical    Other Neck Stretches Sitting and supine cervical retractions                    PT Short Term Goals - 04/18/20 1524      PT SHORT TERM GOAL #1   Title Pt. will be independent with HEP to improve jaw ROM and pain    Time 2    Period Weeks    Status New    Target Date 05/02/20      PT SHORT TERM GOAL #2   Title Decreased tissue tension left SCM/left jaw    Time 2    Period Weeks    Status New    Target Date 05/02/20      PT SHORT TERM GOAL #3   Title Pt. will have increased jaw opening to 23 mm for improved eating ability    Baseline 15    Time 2    Period  Weeks             PT Long Term Goals - 04/18/20 1519      PT LONG TERM GOAL #1   Title Pt. will have jaw opening of 30 mm for improved ability to eat    Baseline 15 mm    Time 4    Period Weeks    Status New    Target Date 05/16/20      PT LONG TERM GOAL #2   Title Right lateral jaw excursion will be equal to left (8 mm) or better    Baseline 3    Time 4    Period Weeks    Status New    Target Date 05/16/20      PT LONG TERM GOAL #3   Title Left jaw pain/tenderness improved by 50% or more    Baseline 5/10    Time 4    Period Weeks    Status New    Target Date 05/16/20                 Plan - 04/29/20 1700    Clinical Impression Statement Pt continues with limitations in jaw ROM and neck ROM however cervical rotation with good improvement passively.  Improved ability to perform cervical retractionss.  Pt is compliant with HEP as instructed.    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 brings, continue manual therapy to jaw and neck, HEP. add pec stretch    PT Home Exercise Plan continue therabite    Consulted and Agree with Plan of Care Patient           Patient will benefit from skilled therapeutic intervention in order to improve the following deficits and impairments:  Postural dysfunction, Decreased strength, Increased edema, Decreased knowledge of precautions, Pain, Increased fascial restricitons, Decreased range of motion, Increased muscle spasms  Visit Diagnosis: Contracture of muscle, multiple  sites  Dysphagia, unspecified type  Abnormal posture  Cervicalgia  Disorder of the skin and subcutaneous tissue related to radiation, unspecified  Malignant neoplasm of tonsillar fossa [facility])     Problem List Patient Active Problem List   Diagnosis Date Noted  . Encounter for cardiac risk counseling 11/08/2019  . Hypokalemia 08/21/2019  . Warfarin anticoagulation 02/20/2019  . Hypercholesteremia 02/07/2019  . Recurrent deep vein thrombosis (DVT) ([place]) 02/06/2019  . Thoracic ascending aortic aneurysm ([place]) 02/06/2019  . Port-A-Cath in place 01/18/2019  . Mucositis due to chemotherapy 12/07/2018  . Hypomagnesemia 12/07/2018  . Thrush 12/07/2018  . Malignant neoplasm of tonsillar fossa ([place]) 10/17/2018  . 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 DVT, lower extremity, proximal ([place]) 05/24/2013  . Chronic anticoagulation 01/02/2013  . Superficial thrombosis of lower extremity 05/02/2012  . Benign essential HTN 01/04/2012  . Hyperlipidemia, mixed 01/04/2012    Claris Pong, PT 04/29/2020, 5:05 PM  [place], [place], [address] Phone: [phone]   Fax:  [phone]  Name: ATHOL BOLDS MRN: [id number] Date of Birth: Aug 11, 1960

## 2020-04-30 NOTE — Progress Notes (Signed)
Oncology Nurse Navigator Documentation  I have left several voice mails with Mr. [name] [name] regarding recommendations for long term dental care. Dr. Isidore Moos requested that I help him establish a community dentist for dental care after treatment for head and neck cancer. At the recommendation of Dr. Enrique Sack DDS, I have left voice mails for Mr. [name] [name] indicating that [facility] [address] in [place] could help provide dental care for Mr. [name] [name] medically compromised situation. I have provided my direct contact information to Mr. [name] [name] to return my call if needed.   Harlow Asa RN, BSN, OCN Head & Neck Oncology Nurse [place] at [facility] Phone # [phone]  Fax # [phone]

## 2020-05-01 ENCOUNTER — Ambulatory Visit: Payer: Medicaid Other | Admitting: Physical Therapy

## 2020-05-01 ENCOUNTER — Encounter: Payer: Self-pay | Admitting: Physical Therapy

## 2020-05-01 ENCOUNTER — Ambulatory Visit: Payer: Medicaid Other | Admitting: Radiation Oncology

## 2020-05-01 ENCOUNTER — Other Ambulatory Visit: Payer: Self-pay

## 2020-05-01 DIAGNOSIS — R1310 Dysphagia, unspecified: Secondary | ICD-10-CM | POA: Diagnosis not present

## 2020-05-01 DIAGNOSIS — R293 Abnormal posture: Secondary | ICD-10-CM

## 2020-05-01 DIAGNOSIS — M6249 Contracture of muscle, multiple sites: Secondary | ICD-10-CM | POA: Diagnosis not present

## 2020-05-01 DIAGNOSIS — M542 Cervicalgia: Secondary | ICD-10-CM

## 2020-05-01 DIAGNOSIS — I890 Lymphedema, not elsewhere classified: Secondary | ICD-10-CM | POA: Diagnosis not present

## 2020-05-01 DIAGNOSIS — C090 Malignant neoplasm of tonsillar fossa: Secondary | ICD-10-CM | POA: Diagnosis not present

## 2020-05-01 DIAGNOSIS — L599 Disorder of the skin and subcutaneous tissue related to radiation, unspecified: Secondary | ICD-10-CM | POA: Diagnosis not present

## 2020-05-01 NOTE — Therapy (Signed)
[place], [place], [address] Phone: [phone]   Fax:  [phone]  Physical Therapy Treatment  Patient Details  Name: [name] [name] MRN: [id number] Date of Birth: 11-24-1960 Referring Provider (PT): Reita May Date: 05/01/2020   PT End of Session - 05/01/20 1703    Visit Number 4    Number of Visits 9    Date for PT Re-Evaluation 05/16/20    Authorization Type [employer]    Authorization Time Period 4 of 27    Authorization - Visit Number 4    Authorization - Number of Visits 9    PT Start Time 1017    PT Stop Time 1653    PT Time Calculation (min) 47 min    Activity Tolerance Patient tolerated treatment well    Behavior During Therapy WFL for tasks assessed/performed           Past Medical History:  Diagnosis Date  . 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 Benign essential HTN 01/04/2012  . Cancer ([place])    tonsilar  . Chronic anticoagulation 01/02/2013  . Dermatofibroma of forearm 01/02/2013   Left side  . Hyperlipidemia, mixed 01/04/2012  . 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 Primary hypercoagulable state ([place]) 10/01/2014  . Sinus bradycardia, chronic 01/04/2012  . Superficial thrombosis of lower extremity 05/02/2012    Past Surgical History:  Procedure Laterality Date  . DIRECT LARYNGOSCOPY Left 10/19/2018   Procedure: DIRECT LARYNGOSCOPY;  Surgeon: Leta Baptist, MD;  Location: [facility];  Service: ENT;  Laterality: Left;  . IR IMAGING GUIDED PORT INSERTION  11/04/2018  . IR REMOVAL TUN ACCESS W/ PORT W/O FL MOD SED  05/01/2019  . TONSILLECTOMY Left 10/19/2018   Procedure: BIOPSY OF LEFT TONSIL;  Surgeon: Leta Baptist, MD;  Location: [facility];  Service: ENT;  Laterality:  Left;    There were no vitals filed for this visit.   Subjective Assessment - 05/01/20 1608    Subjective My neck and jaw is about the same.    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 Yes    Pain Score 2     Pain Location Jaw   and neck   Pain Orientation Left    Pain Descriptors / Indicators Tightness    Pain Type Chronic pain                             [facility] Adult PT Treatment/Exercise - 05/01/20 0001      Neck Exercises: Supine   Other Supine Exercise applied over pressure to clavicles and had pt extend neck and raise chin to stretch anterior neck x 10       Manual Therapy   Soft tissue mobilization to left lateral neck with focus on SCM and submandibular area    Myofascial Release MFR left lateral neck, anterior neck and along jaw line - pt extremely tender along jaw line. Area softened some by end of treatment session today  Passive ROM educated pt to use Therabite 5x, 5 sessions per day for 30 sec each                    PT Short Term Goals - 04/18/20 1524      PT SHORT TERM GOAL #1   Title Pt. will be independent with HEP to improve jaw ROM and pain    Time 2    Period Weeks    Status New    Target Date 05/02/20      PT SHORT TERM GOAL #2   Title Decreased tissue tension left SCM/left jaw    Time 2    Period Weeks    Status New    Target Date 05/02/20      PT SHORT TERM GOAL #3   Title Pt. will have increased jaw opening to 23 mm for improved eating ability    Baseline 15    Time 2    Period Weeks             PT Long Term Goals - 04/18/20 1519      PT LONG TERM GOAL #1   Title Pt. will have jaw opening of 30 mm for improved ability to eat    Baseline 15 mm    Time 4    Period Weeks    Status New      Target Date 05/16/20      PT LONG TERM GOAL #2   Title Right lateral jaw excursion will be equal to left (8 mm) or better    Baseline 3    Time 4    Period Weeks    Status New    Target Date 05/16/20      PT LONG TERM GOAL #3   Title Left jaw pain/tenderness improved by 50% or more    Baseline 5/10    Time 4    Period Weeks    Status New    Target Date 05/16/20                 Plan - 05/01/20 1706    Clinical Impression Statement Manual therapy performed today to anterior and lateral left neck to help improve jaw opening. Pt did demonstrate decrease tightness by end of session with some improvements in ROM. Educated pt to use his therabite at least 5x/day, 5x/session x 30 sec each with 30 sec of rest in between. Pt has not been using it recently.    PT Frequency 2x / week    PT Duration 4 weeks    PT Treatment/Interventions ADLs/Self Care Home Management;Therapeutic exercise;Manual lymph drainage;Manual techniques;Taping;Patient/family education;Passive range of motion;Joint Manipulations;Compression bandaging    PT Next Visit Plan assess therabite if pt. brings, continue manual therapy to jaw and neck, HEP. add pec stretch    PT Home Exercise Plan continue therabite    Consulted and Agree with Plan of Care Patient           Patient will benefit from skilled therapeutic intervention in order to improve the following deficits and impairments:  Postural dysfunction, Decreased strength, Increased edema, Decreased knowledge of precautions, Pain, Increased fascial restricitons, Decreased range of motion, Increased muscle spasms  Visit Diagnosis: Contracture of muscle, multiple sites  Cervicalgia  Abnormal posture     Problem List Patient Active Problem List   Diagnosis Date Noted  . Encounter for cardiac risk counseling 11/08/2019  . Hypokalemia 08/21/2019  . Warfarin anticoagulation 02/20/2019  . Hypercholesteremia 02/07/2019  .  Recurrent deep vein thrombosis  (DVT) ([place]) 02/06/2019  . Thoracic ascending aortic aneurysm ([place]) 02/06/2019  . Port-A-Cath in place 01/18/2019  . Mucositis due to chemotherapy 12/07/2018  . Hypomagnesemia 12/07/2018  . Thrush 12/07/2018  . Malignant neoplasm of tonsillar fossa ([place]) 10/17/2018  . 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 DVT, lower extremity, proximal ([place]) 05/24/2013  . Chronic anticoagulation 01/02/2013  . Superficial thrombosis of lower extremity 05/02/2012  . Benign essential HTN 01/04/2012  . Hyperlipidemia, mixed 01/04/2012    Allyson Sabal [facility] 05/01/2020, 5:09 PM  [place], [place], [address] Phone: [phone]   Fax:  [phone]  Name: [name] [name] MRN: [id number] Date of Birth: [birth year]  Manus Gunning, PT 05/01/20 5:09 PM

## 2020-05-01 NOTE — Patient Instructions (Signed)
Therabite Instructions:  11-21-28 for Trismus and Jaw Stretching  - each day- perform 5 session -each session - stretch 5 times -each stretch- hold for 30 seconds, rest for 30 seconds

## 2020-05-06 ENCOUNTER — Other Ambulatory Visit: Payer: Self-pay

## 2020-05-06 ENCOUNTER — Ambulatory Visit: Payer: Medicaid Other

## 2020-05-06 DIAGNOSIS — R293 Abnormal posture: Secondary | ICD-10-CM

## 2020-05-06 DIAGNOSIS — C090 Malignant neoplasm of tonsillar fossa: Secondary | ICD-10-CM | POA: Diagnosis not present

## 2020-05-06 DIAGNOSIS — R1310 Dysphagia, unspecified: Secondary | ICD-10-CM

## 2020-05-06 DIAGNOSIS — M6249 Contracture of muscle, multiple sites: Secondary | ICD-10-CM

## 2020-05-06 DIAGNOSIS — L599 Disorder of the skin and subcutaneous tissue related to radiation, unspecified: Secondary | ICD-10-CM | POA: Diagnosis not present

## 2020-05-06 DIAGNOSIS — M542 Cervicalgia: Secondary | ICD-10-CM | POA: Diagnosis not present

## 2020-05-06 DIAGNOSIS — I890 Lymphedema, not elsewhere classified: Secondary | ICD-10-CM | POA: Diagnosis not present

## 2020-05-06 NOTE — Therapy (Signed)
[place], [place], [address] Phone: [phone]   Fax:  [phone]  Physical Therapy Treatment  Patient Details  Name: DEQUAN KINDRED MRN: [id number] Date of Birth: September 12, 1960 Referring Provider (PT): Reita May Date: 05/06/2020   PT End of Session - 05/06/20 1659    Visit Number 5    Number of Visits 9    Date for PT Re-Evaluation 05/16/20    Authorization Type [employer]    Authorization Time Period 5 of 27    Authorization - Visit Number 5    Authorization - Number of Visits 9    PT Start Time 1505    PT Stop Time 1555    PT Time Calculation (min) 50 min    Activity Tolerance Patient tolerated treatment well    Behavior During Therapy WFL for tasks assessed/performed           Past Medical History:  Diagnosis Date  . 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 Benign essential HTN 01/04/2012  . Cancer ([place])    tonsilar  . Chronic anticoagulation 01/02/2013  . Dermatofibroma of forearm 01/02/2013   Left side  . Hyperlipidemia, mixed 01/04/2012  . 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 Primary hypercoagulable state ([place]) 10/01/2014  . Sinus bradycardia, chronic 01/04/2012  . Superficial thrombosis of lower extremity 05/02/2012    Past Surgical History:  Procedure Laterality Date  . DIRECT LARYNGOSCOPY Left 10/19/2018   Procedure: DIRECT LARYNGOSCOPY;  Surgeon: Leta Baptist, MD;  Location: [facility];  Service: ENT;  Laterality: Left;  . IR IMAGING GUIDED PORT INSERTION  11/04/2018  . IR REMOVAL TUN ACCESS W/ PORT W/O FL MOD SED  05/01/2019  . TONSILLECTOMY Left 10/19/2018   Procedure: BIOPSY OF LEFT TONSIL;  Surgeon: Leta Baptist, MD;  Location: [facility];  Service: ENT;  Laterality:  Left;    There were no vitals filed for this visit.   Subjective Assessment - 05/06/20 1506    Subjective I did OK after I was here.  Feels a little better after I leave. Improves after I exercise and I try to do several times a day.  No change with opening.  Using therabite several times per day or with popsicle sticks several at a time 4-6.  Maybe a little bit of improvement.    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 Yes    Pain Score 1     Pain Orientation Left    Pain Descriptors / Indicators Tender    Pain Type Chronic pain    Pain Onset More than a month ago    Pain Relieving Factors took around 1:00                             [facility] Adult PT Treatment/Exercise - 05/06/20 0001      Manual Therapy   Soft tissue mobilization Soft tissue work to bilateral UT, levator,SCM and scalenes with emphasis on left and left jaw .  Suboccipital release    Myofascial Release MFR left lateral neck, anterior neck and along jaw line - pt extremely tender along jaw line. Area softened some by end of treatment session today    Passive ROM Bilateral rotation and SB in supine after manual work      Neck Exercises: Stretches   Upper Trapezius Stretch Left;2 reps;30 seconds    Corner Stretch 3 reps;10 seconds    Chest Stretch 3 reps;10 seconds   with scapular retraction   Lower Cervical/Upper Thoracic Stretch --   thoracic stretch over towel roll during manual therapy   Other Neck Stretches right and left rotation, Back bending    Other Neck Stretches sitting and supine cervical retractions                    PT Short Term Goals - 04/18/20 1524      PT SHORT TERM GOAL #1   Title Pt. will be independent with HEP to improve jaw ROM and pain    Time 2     Period Weeks    Status New    Target Date 05/02/20      PT SHORT TERM GOAL #2   Title Decreased tissue tension left SCM/left jaw    Time 2    Period Weeks    Status New    Target Date 05/02/20      PT SHORT TERM GOAL #3   Title Pt. will have increased jaw opening to 23 mm for improved eating ability    Baseline 15    Time 2    Period Weeks             PT Long Term Goals - 04/18/20 1519      PT LONG TERM GOAL #1   Title Pt. will have jaw opening of 30 mm for improved ability to eat    Baseline 15 mm    Time 4    Period Weeks    Status New    Target Date 05/16/20      PT LONG TERM GOAL #2   Title Right lateral jaw excursion will be equal to left (8 mm) or better    Baseline 3    Time 4    Period Weeks    Status New    Target Date 05/16/20      PT LONG TERM GOAL #3   Title Left jaw pain/tenderness improved by 50% or more    Baseline 5/10    Time 4    Period Weeks    Status New    Target Date 05/16/20                 Plan - 05/06/20 1700    Clinical Impression Statement Continued manual therapy to jaw and lateral neck in addition to PROM and pt exs. Cervical ROM still more limited to the left with rotation, however pt reported feeling looser after treatment.  He has used the therabite some, and has also used 4-5 popsicle sticks to work on mouth opening .  Encouraged pt to be as compliant as possible to see if together we can make a change. 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 brings, continue manual therapy to jaw and neck, HEP. add pec stretch    PT Home Exercise Plan Continue therabite, cervical ROM/stretches, Jaw stretches    Consulted and Agree with Plan of Care Patient           Patient will benefit from skilled therapeutic intervention in order to improve the following deficits and impairments:  Postural dysfunction, Decreased strength, Increased edema, Decreased knowledge of precautions, Pain, Increased fascial restricitons, Decreased range of motion, Increased muscle spasms  Visit Diagnosis: Contracture of muscle, multiple sites  Cervicalgia  Abnormal posture  Dysphagia, unspecified type  Disorder of the skin and subcutaneous tissue related to radiation, unspecified  Malignant neoplasm of tonsillar fossa (H[facility])  Lymphedema, not elsewhere classified     Problem List Patient Active Problem List   Diagnosis Date Noted  . Encounter for cardiac risk counseling 11/08/2019  . Hypokalemia 08/21/2019  . Warfarin anticoagulation 02/20/2019  . Hypercholesteremia 02/07/2019  . Recurrent deep vein thrombosis (DVT) ([place]) 02/06/2019  . Thoracic ascending aortic aneurysm ([place]) 02/06/2019  . Port-A-Cath in place 01/18/2019  . Mucositis due to chemotherapy 12/07/2018  . Hypomagnesemia 12/07/2018  . Thrush 12/07/2018  . Malignant neoplasm of tonsillar fossa ([place]) 10/17/2018  . 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 DVT, lower extremity, proximal ([place]) 05/24/2013  . Chronic anticoagulation 01/02/2013  . Superficial thrombosis of lower extremity 05/02/2012  . Benign essential HTN 01/04/2012  . Hyperlipidemia, mixed 01/04/2012    Claris Pong, PT 05/06/2020, 5:09 PM  [place], [place], [address] Phone: [phone]   Fax:   [phone]  Name: ASHTON BELOTE MRN: [id number] Date of Birth: 03-22-1961

## 2020-05-08 ENCOUNTER — Other Ambulatory Visit: Payer: Self-pay

## 2020-05-08 ENCOUNTER — Ambulatory Visit: Payer: Medicaid Other | Admitting: Physical Therapy

## 2020-05-08 ENCOUNTER — Encounter: Payer: Self-pay | Admitting: Physical Therapy

## 2020-05-08 DIAGNOSIS — L599 Disorder of the skin and subcutaneous tissue related to radiation, unspecified: Secondary | ICD-10-CM | POA: Diagnosis not present

## 2020-05-08 DIAGNOSIS — R293 Abnormal posture: Secondary | ICD-10-CM

## 2020-05-08 DIAGNOSIS — C090 Malignant neoplasm of tonsillar fossa: Secondary | ICD-10-CM | POA: Diagnosis not present

## 2020-05-08 DIAGNOSIS — M6249 Contracture of muscle, multiple sites: Secondary | ICD-10-CM | POA: Diagnosis not present

## 2020-05-08 DIAGNOSIS — M542 Cervicalgia: Secondary | ICD-10-CM

## 2020-05-08 DIAGNOSIS — R1310 Dysphagia, unspecified: Secondary | ICD-10-CM | POA: Diagnosis not present

## 2020-05-08 DIAGNOSIS — I890 Lymphedema, not elsewhere classified: Secondary | ICD-10-CM | POA: Diagnosis not present

## 2020-05-08 NOTE — Therapy (Signed)
[place], [place], [address] Phone: [phone]   Fax:  [phone]  Physical Therapy Treatment  Patient Details  Name: [name] [name] MRN: [id number] Date of Birth: 1960/12/23 Referring Provider (PT): Reita May Date: 05/08/2020   PT End of Session - 05/08/20 1657    Visit Number 6    Number of Visits 9    Date for PT Re-Evaluation 05/16/20    Authorization Type [employer]    Authorization Time Period 6 of 27    Authorization - Visit Number 6    Authorization - Number of Visits 9    PT Start Time 2725    PT Stop Time 1655    PT Time Calculation (min) 48 min    Activity Tolerance Patient tolerated treatment well    Behavior During Therapy WFL for tasks assessed/performed           Past Medical History:  Diagnosis Date  . 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 Benign essential HTN 01/04/2012  . Cancer ([place])    tonsilar  . Chronic anticoagulation 01/02/2013  . Dermatofibroma of forearm 01/02/2013   Left side  . Hyperlipidemia, mixed 01/04/2012  . 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 Primary hypercoagulable state ([place]) 10/01/2014  . Sinus bradycardia, chronic 01/04/2012  . Superficial thrombosis of lower extremity 05/02/2012    Past Surgical History:  Procedure Laterality Date  . DIRECT LARYNGOSCOPY Left 10/19/2018   Procedure: DIRECT LARYNGOSCOPY;  Surgeon: Leta Baptist, MD;  Location: [facility];  Service: ENT;  Laterality: Left;  . IR IMAGING GUIDED PORT INSERTION  11/04/2018  . IR REMOVAL TUN ACCESS W/ PORT W/O FL MOD SED  05/01/2019  . TONSILLECTOMY Left 10/19/2018   Procedure: BIOPSY OF LEFT TONSIL;  Surgeon: Leta Baptist, MD;  Location: [facility];  Service: ENT;  Laterality:  Left;    There were no vitals filed for this visit.   Subjective Assessment - 05/08/20 1609    Subjective I do not feel like I am making a lot of progress here.    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 No/denies    Pain Score 0-No pain                             [facility] Adult PT Treatment/Exercise - 05/08/20 0001      Manual Therapy   Soft tissue mobilization Soft tissue work to bilateral UT, levator,SCM and scalenes with emphasis on left and left jaw . Suboccipital release    Myofascial Release MFR left lateral neck, anterior neck and along jaw line - pt extremely tender along jaw line - decreased tenderness noted today    Passive ROM Bilateral rotation and SB in supine with prolonged holds                    PT Short Term Goals - 04/18/20 1524      PT SHORT TERM GOAL #1   Title Pt.  will be independent with HEP to improve jaw ROM and pain    Time 2    Period Weeks    Status New    Target Date 05/02/20      PT SHORT TERM GOAL #2   Title Decreased tissue tension left SCM/left jaw    Time 2    Period Weeks    Status New    Target Date 05/02/20      PT SHORT TERM GOAL #3   Title Pt. will have increased jaw opening to 23 mm for improved eating ability    Baseline 15    Time 2    Period Weeks             PT Long Term Goals - 04/18/20 1519      PT LONG TERM GOAL #1   Title Pt. will have jaw opening of 30 mm for improved ability to eat    Baseline 15 mm    Time 4    Period Weeks    Status New    Target Date 05/16/20      PT LONG TERM GOAL #2   Title Right lateral jaw excursion will be equal to left (8 mm) or better    Baseline 3    Time 4    Period Weeks    Status New    Target Date 05/16/20      PT LONG TERM  GOAL #3   Title Left jaw pain/tenderness improved by 50% or more    Baseline 5/10    Time 4    Period Weeks    Status New    Target Date 05/16/20                 Plan - 05/08/20 1658    Clinical Impression Statement Pt reports decreased tenderness to L jaw and SCMs today. Pt is interested in dry needling to help decrease muscle tightness and allow improved jaw ROM so will add that to POC next week. Pt is still very limited with cervical ROM due to radiation fibrosis. Muscles to relax some with soft tissue mobilization. Encouraged pt to use popsicle sticks for 30 sec at a time and take a 30 sec break in between and do 5x.    PT Frequency 2x / week    PT Duration 4 weeks    PT Treatment/Interventions ADLs/Self Care Home Management;Therapeutic exercise;Manual lymph drainage;Manual techniques;Taping;Patient/family education;Passive range of motion;Joint Manipulations;Compression bandaging    PT Next Visit Plan assess therabite if pt. brings, continue manual therapy to jaw and neck, HEP. add pec stretch, at end of next week do recert and add dry needling as pt is scheduled to begin this the first week of Nov with Carlus Pavlov    PT Home Exercise Plan Continue therabite, cervical ROM/stretches, Jaw stretches    Consulted and Agree with Plan of Care Patient           Patient will benefit from skilled therapeutic intervention in order to improve the following deficits and impairments:  Postural dysfunction, Decreased strength, Increased edema, Decreased knowledge of precautions, Pain, Increased fascial restricitons, Decreased range of motion, Increased muscle spasms  Visit Diagnosis: Contracture of muscle, multiple sites  Cervicalgia  Abnormal posture  Disorder of the skin and subcutaneous tissue related to radiation, unspecified     Problem List Patient Active Problem List   Diagnosis Date Noted  . Encounter for cardiac risk counseling 11/08/2019  . Hypokalemia 08/21/2019  .  Warfarin anticoagulation  02/20/2019  . Hypercholesteremia 02/07/2019  . Recurrent deep vein thrombosis (DVT) ([place]) 02/06/2019  . Thoracic ascending aortic aneurysm ([place]) 02/06/2019  . Port-A-Cath in place 01/18/2019  . Mucositis due to chemotherapy 12/07/2018  . Hypomagnesemia 12/07/2018  . Thrush 12/07/2018  . Malignant neoplasm of tonsillar fossa ([place]) 10/17/2018  . 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 DVT, lower extremity, proximal ([place]) 05/24/2013  . Chronic anticoagulation 01/02/2013  . Superficial thrombosis of lower extremity 05/02/2012  . Benign essential HTN 01/04/2012  . Hyperlipidemia, mixed 01/04/2012    Allyson Sabal [facility] 05/08/2020, 5:01 PM  [place], [place], [address] Phone: [phone]   Fax:  [phone]  Name: [name] [name] MRN: [id number] Date of Birth: Nov 18, 1960   Manus Gunning, PT 05/08/20 5:01 PM

## 2020-05-13 ENCOUNTER — Other Ambulatory Visit: Payer: Self-pay

## 2020-05-13 ENCOUNTER — Ambulatory Visit: Payer: Medicaid Other

## 2020-05-13 DIAGNOSIS — R1310 Dysphagia, unspecified: Secondary | ICD-10-CM

## 2020-05-13 DIAGNOSIS — R293 Abnormal posture: Secondary | ICD-10-CM

## 2020-05-13 DIAGNOSIS — I890 Lymphedema, not elsewhere classified: Secondary | ICD-10-CM

## 2020-05-13 DIAGNOSIS — L599 Disorder of the skin and subcutaneous tissue related to radiation, unspecified: Secondary | ICD-10-CM | POA: Diagnosis not present

## 2020-05-13 DIAGNOSIS — C090 Malignant neoplasm of tonsillar fossa: Secondary | ICD-10-CM | POA: Diagnosis not present

## 2020-05-13 DIAGNOSIS — M6249 Contracture of muscle, multiple sites: Secondary | ICD-10-CM

## 2020-05-13 DIAGNOSIS — M542 Cervicalgia: Secondary | ICD-10-CM

## 2020-05-13 NOTE — Therapy (Signed)
[place], [place], [address] Phone: [phone]   Fax:  [phone]  Physical Therapy Treatment  Patient Details  Name: [name] [name] MRN: [id number] Date of Birth: 09/04/1960 Referring Provider (PT): Reita May Date: 05/13/2020   PT End of Session - 05/13/20 1652    Visit Number 7    Number of Visits 9    Date for PT Re-Evaluation 05/16/20    Authorization Time Period 7 of 27    Authorization - Visit Number 7    PT Start Time 1603    PT Stop Time 1647    PT Time Calculation (min) 44 min    Activity Tolerance Patient tolerated treatment well    Behavior During Therapy [facility] for tasks assessed/performed           Past Medical History:  Diagnosis Date  . 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 Benign essential HTN 01/04/2012  . Cancer ([place])    tonsilar  . Chronic anticoagulation 01/02/2013  . Dermatofibroma of forearm 01/02/2013   Left side  . Hyperlipidemia, mixed 01/04/2012  . 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 Primary hypercoagulable state ([place]) 10/01/2014  . Sinus bradycardia, chronic 01/04/2012  . Superficial thrombosis of lower extremity 05/02/2012    Past Surgical History:  Procedure Laterality Date  . DIRECT LARYNGOSCOPY Left 10/19/2018   Procedure: DIRECT LARYNGOSCOPY;  Surgeon: Leta Baptist, MD;  Location: [place];  Service: ENT;  Laterality: Left;  . IR IMAGING GUIDED PORT INSERTION  11/04/2018  . IR REMOVAL TUN ACCESS W/ PORT W/O FL MOD SED  05/01/2019  . TONSILLECTOMY Left 10/19/2018   Procedure: BIOPSY OF LEFT TONSIL;  Surgeon: Leta Baptist, MD;  Location: [place];  Service: ENT;  Laterality: Left;    There were no vitals filed for this visit.   Subjective Assessment -  05/13/20 1602    Subjective Maybe a little progress after left visit, but nothing dramatic. Jaw tenderness is a little better.  Reports compliance with HEP. Think I can open a little better.  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 Yes    Pain Score 2     Pain Onset More than a month ago                             [facility] Adult PT Treatment/Exercise - 05/13/20 0001      Manual Therapy   Soft tissue mobilization Soft tissue work to bilateral UT, levator,SCM and scalenes with emphasis on left and left jaw . Suboccipital release    Myofascial Release MFR left lateral neck, anterior neck and along jaw line - pt extremely tender along jaw line - decreased tenderness noted today    Passive ROM Bilateral rotation and SB in supine with prolonged holds      Neck Exercises: Stretches   Neck Stretch 5 reps  Back bending with jaw opening                   PT Short Term Goals - 04/18/20 1524      PT SHORT TERM GOAL #1   Title Pt. will be independent with HEP to improve jaw ROM and pain    Time 2    Period Weeks    Status New    Target Date 05/02/20      PT SHORT TERM GOAL #2   Title Decreased tissue tension left SCM/left jaw    Time 2    Period Weeks    Status New    Target Date 05/02/20      PT SHORT TERM GOAL #3   Title Pt. will have increased jaw opening to 23 mm for improved eating ability    Baseline 15    Time 2    Period Weeks             PT Long Term Goals - 04/18/20 1519      PT LONG TERM GOAL #1   Title Pt. will have jaw opening of 30 mm for improved ability to eat    Baseline 15 mm    Time 4    Period Weeks    Status New    Target Date 05/16/20        PT LONG TERM GOAL #2   Title Right lateral jaw excursion will be equal to left (8 mm) or better    Baseline 3    Time 4    Period Weeks    Status New    Target Date 05/16/20      PT LONG TERM GOAL #3   Title Left jaw pain/tenderness improved by 50% or more    Baseline 5/10    Time 4    Period Weeks    Status New    Target Date 05/16/20                 Plan - 05/13/20 1654    Clinical Impression Statement Pt with decreased area of induration left jaw and improved tenderness.  Pt is now able to get 6 popsicle sticks in now as opposed to 4 when he first started.  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, radiation fibrosis and radiation necrosis    Examination-Activity Limitations Self Feeding    Stability/Clinical Decision Making Stable/Uncomplicated    Rehab Potential Good    PT Frequency 2x / week    PT Duration 4 weeks    PT Treatment/Interventions ADLs/Self Care Home Management;Therapeutic exercise;Manual lymph drainage;Manual techniques;Taping;Patient/family education;Passive range of motion;Joint Manipulations;Compression bandaging    PT Next Visit Plan Recert, continue Manual, ROM    PT Home Exercise Plan Continue therabite, cervical ROM/stretches, Jaw stretches    Consulted and Agree with Plan of Care Patient           Patient will benefit from skilled therapeutic intervention in order to improve the following deficits and impairments:  Postural dysfunction, Decreased strength, Increased edema, Decreased knowledge of precautions, Pain, Increased fascial restricitons, Decreased range of motion, Increased muscle spasms  Visit Diagnosis: Contracture of muscle, multiple sites  Cervicalgia  Abnormal posture  Disorder of the skin and subcutaneous tissue related to radiation, unspecified  Dysphagia, unspecified type  Malignant neoplasm of tonsillar fossa  (HCC)  Lymphedema, not elsewhere classified  Problem List Patient Active Problem List   Diagnosis Date Noted  . Encounter for cardiac risk counseling 11/08/2019  . Hypokalemia 08/21/2019  . Warfarin anticoagulation 02/20/2019  . Hypercholesteremia 02/07/2019  . Recurrent deep vein thrombosis (DVT) ([place]) 02/06/2019  . Thoracic ascending aortic aneurysm ([place]) 02/06/2019  . Port-A-Cath in place 01/18/2019  . Mucositis due to chemotherapy 12/07/2018  . Hypomagnesemia 12/07/2018  . Thrush 12/07/2018  . Malignant neoplasm of tonsillar fossa ([place]) 10/17/2018  . 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 DVT, lower extremity, proximal ([place]) 05/24/2013  . Chronic anticoagulation 01/02/2013  . Superficial thrombosis of lower extremity 05/02/2012  . Benign essential HTN 01/04/2012  . Hyperlipidemia, mixed 01/04/2012    Claris Pong, PT 05/13/2020, 5:00 PM  [place], [place], [address] Phone: [phone]   Fax:  [phone]  Name: [name] [name] MRN: [id number] Date of Birth: [birth year]

## 2020-05-16 ENCOUNTER — Ambulatory Visit: Payer: Medicaid Other | Admitting: Physical Therapy

## 2020-05-16 ENCOUNTER — Encounter: Payer: Self-pay | Admitting: Physical Therapy

## 2020-05-16 ENCOUNTER — Other Ambulatory Visit: Payer: Self-pay

## 2020-05-16 DIAGNOSIS — R1310 Dysphagia, unspecified: Secondary | ICD-10-CM | POA: Diagnosis not present

## 2020-05-16 DIAGNOSIS — C090 Malignant neoplasm of tonsillar fossa: Secondary | ICD-10-CM | POA: Diagnosis not present

## 2020-05-16 DIAGNOSIS — L599 Disorder of the skin and subcutaneous tissue related to radiation, unspecified: Secondary | ICD-10-CM

## 2020-05-16 DIAGNOSIS — M6249 Contracture of muscle, multiple sites: Secondary | ICD-10-CM

## 2020-05-16 DIAGNOSIS — M542 Cervicalgia: Secondary | ICD-10-CM

## 2020-05-16 DIAGNOSIS — R293 Abnormal posture: Secondary | ICD-10-CM | POA: Diagnosis not present

## 2020-05-16 DIAGNOSIS — I890 Lymphedema, not elsewhere classified: Secondary | ICD-10-CM | POA: Diagnosis not present

## 2020-05-16 NOTE — Therapy (Signed)
[place], [place], [address] Phone: [phone]   Fax:  [phone]  Physical Therapy Treatment  Patient Details  Name: [name] [name] MRN: [id number] Date of Birth: 09-21-1960 Referring Provider (PT): Reita May Date: 05/16/2020   PT End of Session - 05/16/20 1707    Visit Number 8    Number of Visits 25    Date for PT Re-Evaluation 07/19/20    Authorization Type [facility] Medicaid    Authorization Time Period 05/20/2020 - 07/19/2020    Authorization - Visit Number 8    Authorization - Number of Visits 32    PT Start Time 3710    PT Stop Time 1650    PT Time Calculation (min) 47 min    Activity Tolerance Patient tolerated treatment well    Behavior During Therapy [facility] for tasks assessed/performed           Past Medical History:  Diagnosis Date  . 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 Benign essential HTN 01/04/2012  . Cancer ([place])    tonsilar  . Chronic anticoagulation 01/02/2013  . Dermatofibroma of forearm 01/02/2013   Left side  . Hyperlipidemia, mixed 01/04/2012  . 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 Primary hypercoagulable state ([place]) 10/01/2014  . Sinus bradycardia, chronic 01/04/2012  . Superficial thrombosis of lower extremity 05/02/2012    Past Surgical History:  Procedure Laterality Date  . DIRECT LARYNGOSCOPY Left 10/19/2018   Procedure: DIRECT LARYNGOSCOPY;  Surgeon: Leta Baptist, MD;  Location: [place];  Service: ENT;  Laterality: Left;  . IR IMAGING GUIDED PORT INSERTION  11/04/2018  . IR REMOVAL TUN ACCESS W/ PORT W/O FL MOD SED  05/01/2019  . TONSILLECTOMY Left 10/19/2018   Procedure: BIOPSY OF LEFT TONSIL;  Surgeon: Leta Baptist, MD;  Location: [place];  Service:  ENT;  Laterality: Left;    There were no vitals filed for this visit.   Subjective Assessment - 05/16/20 1607    Subjective Pt says he is doing his mouth opening exercises several time a day and still uses his popsicle sticks.  He is not using the Therabite as it is too painful. He does have the Flexitouch and uses it occasionally. He doesn't seem to have much swelling.  It doesn't do much  for his jaw pain.    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 Yes    Pain Score 1     Pain Location Jaw   and a little in his throat   Pain Orientation Left    Pain Descriptors / Indicators Tightness    Pain Type Chronic pain    Pain Onset More than a month ago    Pain Frequency Intermittent    Aggravating Factors  touch    Pain Relieving Factors medication    Effect of Pain on Daily Activities limits what he can eat              [facility] PT Assessment - 05/16/20 0001      Assessment  Medical Diagnosis L tonsil squamous cell carcinoma    Referring Provider (PT) Isidore Moos    Onset Date/Surgical Date 10/19/18      Prior Function   Level of Independence Independent      AROM   Jaw-Incisal Opening  22    Jaw-Left Lateral Excurison 6    Jaw-Right Lateral Excursion 6                         [facility] Adult PT Treatment/Exercise - 05/16/20 0001      Neck Exercises: Seated   Shoulder Rolls Backwards;5 reps    Shoulder Flexion Left;5 reps    Other Seated Exercise neck ROM with extra time into neck extension       Manual Therapy   Joint Mobilization intra oral with gloved thumb on back teeth for left side TMJ distraction and oscillations     Soft tissue mobilization Soft tissue work to bilateral UT, levator,SCM and scalenes with emphasis on left and left jaw .  Suboccipital release    Myofascial Release MFR left lateral neck, anterior neck and along jaw line - pt extremely tender along jaw line -                    PT Short Term Goals - 05/16/20 1622      PT SHORT TERM GOAL #1   Title Pt. will be independent with HEP to improve jaw ROM and pain    Status Achieved      PT SHORT TERM GOAL #2   Title Pt will report there is decreased tissue tension left SCM/left jaw byt 25%    Baseline 05/16/2020 pt states it doesn't seem to be quite as bad but it is still present    Time 4    Status On-going      PT SHORT TERM GOAL #3   Title Pt. will have increased jaw opening to 23 mm for improved eating ability    Baseline 15 at eval, 20 on 05/16/2020    Time 4    Period Weeks    Status On-going      PT SHORT TERM GOAL #4   Title Pt will report he is able to touch his left jawline with 0/10 pain    Time 8    Period Weeks    Status New             PT [name] Term Goals - 05/16/20 1615      PT [name] TERM GOAL #1   Title Pt. will have jaw opening of 30 mm for improved ability to eat    Baseline 15 mm on eval, 20 mm on 05/16/2020    Time 8    Period Weeks    Status On-going      PT [name] TERM GOAL #2   Title Right lateral jaw excursion will be equal to left (8 mm) or better    Baseline 3 on eval, 6 on 05/16/2020    Time 8    Period Weeks    Status On-going      PT [name] TERM GOAL #3   Title Left jaw pain/tenderness improved by 50% or more    Baseline 5/10 on eval, 2/10 on 10/ 28/2021    Status Achieved      PT [name] TERM GOAL #4   Title Pt will report he is able to touch his left jawline with 0/10 pain    Baseline 2/10  on 05/16/2020    Time 8    Period Weeks                 Plan - 05/16/20 1709    Clinical Impression Statement Pt cotninues to have very tight tissue with radiation fibrosis on left lateral neck and SCM with limited jaw opening.  He is very tender to the touch along jaw line. 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, radiation fibrosis and radiation necrosis    Examination-Activity Limitations Self Feeding    Stability/Clinical Decision Making Stable/Uncomplicated    Clinical Decision Making Low    Rehab Potential Good    PT Frequency 2x / week    PT Duration 8 weeks    PT Treatment/Interventions ADLs/Self Care Home Management;Therapeutic exercise;Manual lymph drainage;Manual techniques;Taping;Patient/family education;Passive range of motion;Joint Manipulations;Compression bandaging;Dry needling;Moist Heat;Vasopneumatic Device;Therapeutic activities    PT Next Visit Plan dry needling and exercise, continue with soft tissue and manual work with ROM    PT Home Exercise Plan Continue therabite, cervical ROM/stretches, Jaw stretches    Consulted and Agree with Plan of Care Patient           Check all possible CPT codes: 97110- Therapeutic Exercise, [phone]- Neuro Re-education, 97140 - Manual Therapy, 97530 - Therapeutic Activities, 97535 - Self Care, 97014 - Electrical stimulation (unattended) and 97032 - Electrical stimulation (Manual) dry needling              Patient will benefit from skilled therapeutic intervention in order to improve the following deficits and impairments:  Postural dysfunction, Decreased strength, Increased edema, Decreased knowledge of precautions, Pain, Increased fascial restricitons, Decreased range of motion, Increased muscle spasms  Visit Diagnosis: Disorder of the skin and subcutaneous tissue related to radiation, unspecified - Plan: PT plan of care cert/re-cert  Abnormal posture - Plan: PT plan of care cert/re-cert  Cervicalgia - Plan: PT plan of care cert/re-cert  Contracture of muscle, multiple sites - Plan: PT plan of care cert/re-cert     Problem List Patient Active Problem List    Diagnosis Date Noted  . Encounter for cardiac risk counseling 11/08/2019  . Hypokalemia 08/21/2019  . Warfarin anticoagulation 02/20/2019  . Hypercholesteremia 02/07/2019  . Recurrent deep vein thrombosis (DVT) ([place]) 02/06/2019  . Thoracic ascending aortic aneurysm ([place]) 02/06/2019  . Port-A-Cath in place 01/18/2019  . Mucositis due to chemotherapy 12/07/2018  . Hypomagnesemia 12/07/2018  . Thrush 12/07/2018  . Malignant neoplasm of tonsillar fossa ([place]) 10/17/2018  . 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 DVT, lower extremity, proximal ([place]) 05/24/2013  . Chronic anticoagulation 01/02/2013  . Superficial thrombosis of lower extremity 05/02/2012  . Benign essential HTN 01/04/2012  . Hyperlipidemia, mixed 01/04/2012   [name]  Norwood Levo 05/16/2020, 5:19 PM  [place], [place], [address] Phone: [phone]   Fax:  [phone]  Name: [name] [name] MRN: [id number] Date of Birth: 1961/05/15

## 2020-05-20 DIAGNOSIS — C024 Malignant neoplasm of lingual tonsil: Secondary | ICD-10-CM | POA: Diagnosis not present

## 2020-05-21 ENCOUNTER — Telehealth: Payer: Self-pay | Admitting: Adult Health Nurse Practitioner

## 2020-05-21 NOTE — Telephone Encounter (Signed)
Returned SO's VM.  Patient needing refill on oxycodone 5mg  Q8 hrs PRN for pain.  Checked [place] substance abuse data base with no concerns.  Escript sent to [facility] [place].  Called SO/patient back and left VM with reason for call and contact info Annaston Upham K. Olena Heckle NP

## 2020-05-24 ENCOUNTER — Ambulatory Visit: Payer: Medicaid Other | Admitting: Physical Therapy

## 2020-05-29 ENCOUNTER — Ambulatory Visit: Payer: Medicaid Other | Attending: Radiation Oncology | Admitting: Physical Therapy

## 2020-05-29 ENCOUNTER — Other Ambulatory Visit: Payer: Self-pay

## 2020-05-29 ENCOUNTER — Encounter: Payer: Self-pay | Admitting: Physical Therapy

## 2020-05-29 DIAGNOSIS — L599 Disorder of the skin and subcutaneous tissue related to radiation, unspecified: Secondary | ICD-10-CM | POA: Insufficient documentation

## 2020-05-29 DIAGNOSIS — M6249 Contracture of muscle, multiple sites: Secondary | ICD-10-CM | POA: Diagnosis not present

## 2020-05-29 DIAGNOSIS — M542 Cervicalgia: Secondary | ICD-10-CM | POA: Diagnosis not present

## 2020-05-29 DIAGNOSIS — R293 Abnormal posture: Secondary | ICD-10-CM | POA: Insufficient documentation

## 2020-05-29 NOTE — Therapy (Signed)
[place], [place], [address] Phone: [phone]   Fax:  [phone]  Physical Therapy Treatment  Patient Details  Name: [name] [name] MRN: [id number] Date of Birth: 07/30/1960 Referring Provider (PT): Reita May Date: 05/29/2020   PT End of Session - 05/29/20 1500    Visit Number 9    Number of Visits 25    Date for PT Re-Evaluation 07/19/20    Authorization Type [facility] Medicaid    Authorization Time Period 05/20/2020 - 07/19/2020    Authorization - Visit Number 9    Authorization - Number of Visits 32    PT Start Time 1500    PT Stop Time 1545    PT Time Calculation (min) 45 min    Activity Tolerance Patient tolerated treatment well    Behavior During Therapy [facility] for tasks assessed/performed           Past Medical History:  Diagnosis Date  . 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 Benign essential HTN 01/04/2012  . Cancer ([place])    tonsilar  . Chronic anticoagulation 01/02/2013  . Dermatofibroma of forearm 01/02/2013   Left side  . Hyperlipidemia, mixed 01/04/2012  . 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 Primary hypercoagulable state ([place]) 10/01/2014  . Sinus bradycardia, chronic 01/04/2012  . Superficial thrombosis of lower extremity 05/02/2012    Past Surgical History:  Procedure Laterality Date  . DIRECT LARYNGOSCOPY Left 10/19/2018   Procedure: DIRECT LARYNGOSCOPY;  Surgeon: Leta Baptist, MD;  Location: [place];  Service: ENT;  Laterality: Left;  . IR IMAGING GUIDED PORT INSERTION  11/04/2018  . IR REMOVAL TUN ACCESS W/ PORT W/O FL MOD SED  05/01/2019  . TONSILLECTOMY Left 10/19/2018   Procedure: BIOPSY OF LEFT TONSIL;  Surgeon: Leta Baptist, MD;  Location: [place];  Service: ENT;   Laterality: Left;    There were no vitals filed for this visit.   Subjective Assessment - 05/29/20 1502    Subjective "I am still having tightness and pain in my jaw and    Patient Stated Goals to get my mouth to open further with less pain    Currently in Pain? Yes    Pain Score 1    took some advil at 1pm   Pain Location Jaw    Pain Orientation Left    Pain Descriptors / Indicators Tightness;Aching;Sore    Pain Onset More than a month ago    Pain Frequency Constant    Aggravating Factors  touch    Pain Relieving Factors medication              [facility] PT Assessment - 05/29/20 0001      Assessment   Medical Diagnosis L tonsil squamous cell carcinoma    Referring Provider (PT) Isidore Moos    Onset Date/Surgical Date 10/19/18      AROM   Jaw-Incisal Opening  10                         [facility] Adult PT Treatment/Exercise - 05/29/20 0001      Exercises   Other Exercises  clenching down on the 2-3 tongue depressors along R molars to promote L TMJ gapping      Manual  Therapy   Manual Therapy Other (comment)    Manual therapy comments skilled palpation and monitoring of pt throughout TPDN    Joint Mobilization intra oral with gloved thumb on back teeth for left side TMJ distraction  with anteiror glide    Soft tissue mobilization IASTM along the L masseter, L SCM slow controlled motions    Myofascial Release fascial rolling along the L SCM     Other Manual Therapy desensitization along the L mandible with pillow case.       Neck Exercises: Stretches   Upper Trapezius Stretch 2 reps;30 seconds            Trigger Point Dry Needling - 05/29/20 0001    Consent Given? Yes    Education Handout Provided Yes    Muscles Treated Head and Neck Masseter;Lateral pterygoid;Temporalis;Sternocleidomastoid    Temporalis Response Twitch reponse elicited;Palpable increased muscle length   L only   Masseter Response Twitch reponse elicited;Palpable increased muscle length   L  only   Lateral pterygoid Response Twitch reponse elicited;Palpable increased muscle length   L only               PT Education - 05/29/20 1732    Education Details muscle anatomy and benefits of TPDN ,what to expect and after care.    Person(s) Educated Patient    Methods Explanation;Verbal cues    Comprehension Verbalized understanding;Verbal cues required            PT Short Term Goals - 05/16/20 1622      PT SHORT TERM GOAL #1   Title Pt. will be independent with HEP to improve jaw ROM and pain    Status Achieved      PT SHORT TERM GOAL #2   Title Pt will report there is decreased tissue tension left SCM/left jaw byt 25%    Baseline 05/16/2020 pt states it doesn't seem to be quite as bad but it is still present    Time 4    Status On-going      PT SHORT TERM GOAL #3   Title Pt. will have increased jaw opening to 23 mm for improved eating ability    Baseline 15 at eval, 20 on 05/16/2020    Time 4    Period Weeks    Status On-going      PT SHORT TERM GOAL #4   Title Pt will report he is able to touch his left jawline with 0/10 pain    Time 8    Period Weeks    Status New             PT Long Term Goals - 05/16/20 1615      PT LONG TERM GOAL #1   Title Pt. will have jaw opening of 30 mm for improved ability to eat    Baseline 15 mm on eval, 20 mm on 05/16/2020    Time 8    Period Weeks    Status On-going      PT LONG TERM GOAL #2   Title Right lateral jaw excursion will be equal to left (8 mm) or better    Baseline 3 on eval, 6 on 05/16/2020    Time 8    Period Weeks    Status On-going      PT LONG TERM GOAL #3   Title Left jaw pain/tenderness improved by 50% or more    Baseline 5/10 on eval, 2/10 on 10/ 28/2021    Status  Achieved      PT LONG TERM GOAL #4   Title Pt will report he is able to touch his left jawline with 0/10 pain    Baseline 2/10 on 05/16/2020    Time 8    Period Weeks                 Plan - 05/29/20 1727     Clinical Impression Statement pt measures 1cm opening starting session today. Educated and consent was given for TPDN for masseter, temproarlis, lateral pterygoid, and SCM followed with IASTM technique, as well as intraoral TMJ mobs and low load long durationg stretching. He tolerated treatment well and demonstrating opening following session to 1.5cm.    PT Treatment/Interventions ADLs/Self Care Home Management;Therapeutic exercise;Manual lymph drainage;Manual techniques;Taping;Patient/family education;Passive range of motion;Joint Manipulations;Compression bandaging;Dry needling;Moist Heat;Vasopneumatic Device;Therapeutic activities    PT Next Visit Plan response to DN, dry needling and exercise, continue with soft tissue and manual work with ROM    PT Home Exercise Plan Continue therabite, cervical ROM/stretches, Jaw stretches    Consulted and Agree with Plan of Care Patient           Patient will benefit from skilled therapeutic intervention in order to improve the following deficits and impairments:  Postural dysfunction, Decreased strength, Increased edema, Decreased knowledge of precautions, Pain, Increased fascial restricitons, Decreased range of motion, Increased muscle spasms  Visit Diagnosis: Disorder of the skin and subcutaneous tissue 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 Encounter for cardiac risk counseling 11/08/2019  . Hypokalemia 08/21/2019  . Warfarin anticoagulation 02/20/2019  . Hypercholesteremia 02/07/2019  . Recurrent deep vein thrombosis (DVT) ([place]) 02/06/2019  . Thoracic ascending aortic aneurysm ([place]) 02/06/2019  . Port-A-Cath in place 01/18/2019  . Mucositis due to chemotherapy 12/07/2018  . Hypomagnesemia 12/07/2018  . Thrush 12/07/2018  . Malignant neoplasm of tonsillar fossa ([place]) 10/17/2018  . Arterial embolus  and thrombosis of lower extremity ([place]) 05/25/2017  . 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 DVT, lower extremity, proximal ([place]) 05/24/2013  . Chronic anticoagulation 01/02/2013  . Superficial thrombosis of lower extremity 05/02/2012  . Benign essential HTN 01/04/2012  . Hyperlipidemia, mixed 01/04/2012    [name] [name] PT, DPT, LAT, ATC  05/29/20  5:34 PM      [place] [facility] [address] [place], [place], [address] Phone: [phone]   Fax:  [phone]  Name: [name] [name] MRN: [id number] Date of Birth: 04-27-1961

## 2020-06-10 ENCOUNTER — Other Ambulatory Visit: Payer: Self-pay

## 2020-06-10 ENCOUNTER — Ambulatory Visit: Payer: Medicaid Other | Admitting: Physical Therapy

## 2020-06-10 ENCOUNTER — Encounter: Payer: Self-pay | Admitting: Physical Therapy

## 2020-06-10 DIAGNOSIS — M542 Cervicalgia: Secondary | ICD-10-CM

## 2020-06-10 DIAGNOSIS — M6249 Contracture of muscle, multiple sites: Secondary | ICD-10-CM | POA: Diagnosis not present

## 2020-06-10 DIAGNOSIS — L599 Disorder of the skin and subcutaneous tissue related to radiation, unspecified: Secondary | ICD-10-CM

## 2020-06-10 DIAGNOSIS — R293 Abnormal posture: Secondary | ICD-10-CM | POA: Diagnosis not present

## 2020-06-10 NOTE — Therapy (Signed)
[place], [place], [address] Phone: [phone]   Fax:  [phone]  Physical Therapy Treatment  Patient Details  Name: [name] [name] MRN: [id number] Date of Birth: 1961/03/25 Referring Provider (PT): Reita May Date: 06/10/2020   PT End of Session - 06/10/20 6073    Visit Number 10    Number of Visits 25    Date for PT Re-Evaluation 07/19/20    Authorization Type [facility] Medicaid    Authorization Time Period 05/20/2020 - 07/19/2020    Authorization - Visit Number 10    Authorization - Number of Visits 32    PT Start Time 7106    PT Stop Time 1713    PT Time Calculation (min) 40 min    Activity Tolerance Patient tolerated treatment well    Behavior During Therapy [facility] for tasks assessed/performed           Past Medical History:  Diagnosis Date  . 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 Benign essential HTN 01/04/2012  . Cancer ([place])    tonsilar  . Chronic anticoagulation 01/02/2013  . Dermatofibroma of forearm 01/02/2013   Left side  . Hyperlipidemia, mixed 01/04/2012  . 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 Primary hypercoagulable state ([place]) 10/01/2014  . Sinus bradycardia, chronic 01/04/2012  . Superficial thrombosis of lower extremity 05/02/2012    Past Surgical History:  Procedure Laterality Date  . DIRECT LARYNGOSCOPY Left 10/19/2018   Procedure: DIRECT LARYNGOSCOPY;  Surgeon: Leta Baptist, MD;  Location: [place];  Service: ENT;  Laterality: Left;  . IR IMAGING GUIDED PORT INSERTION  11/04/2018  . IR REMOVAL TUN ACCESS W/ PORT W/O FL MOD SED  05/01/2019  . TONSILLECTOMY Left 10/19/2018   Procedure: BIOPSY OF LEFT TONSIL;  Surgeon: Leta Baptist, MD;  Location: [place];  Service:  ENT;  Laterality: Left;    There were no vitals filed for this visit.   Subjective Assessment - 06/10/20 1635    Subjective "I am feel like the last session helped, and I think I am opening more"    Patient Stated Goals to get my mouth to open further with less pain    Currently in Pain? Yes    Pain Score 1     Aggravating Factors  touch    Pain Relieving Factors medicaiton              [facility] PT Assessment - 06/10/20 0001      Assessment   Medical Diagnosis L tonsil squamous cell carcinoma    Referring Provider (PT) Squire      AROM   Jaw-Incisal Opening  13                         [facility] Adult PT Treatment/Exercise - 06/10/20 0001      Exercises   Other Exercises  clenching down on the 5 tongue depressors along R molars to promote L TMJ gapping, and performing on the R side to alllow for space to place 5-6 tongue deperessors in L molars for resting position stretch      Manual Therapy   Manual therapy comments skilled palpation and monitoring of pt throughout TPDN    Joint Mobilization intra oral with gloved  thumb on back teeth for left side TMJ distraction  with anteiror glide    Soft tissue mobilization IASTM along the L masseter, L SCM slow controlled motions    Myofascial Release fascial rolling along the L SCM             Trigger Point Dry Needling - 06/10/20 0001    Muscles Treated Head and Neck Sternocleidomastoid    Sternocleidomastoid Response Twitch response elicited;Palpable increased muscle length    Masseter Response Twitch reponse elicited;Palpable increased muscle length    Lateral pterygoid Response Twitch reponse elicited;Palpable increased muscle length                  PT Short Term Goals - 05/16/20 1622      PT SHORT TERM GOAL #1   Title Pt. will be independent with HEP to improve jaw ROM and pain    Status Achieved      PT SHORT TERM GOAL #2   Title Pt will report there is decreased tissue tension left SCM/left jaw  byt 25%    Baseline 05/16/2020 pt states it doesn't seem to be quite as bad but it is still present    Time 4    Status On-going      PT SHORT TERM GOAL #3   Title Pt. will have increased jaw opening to 23 mm for improved eating ability    Baseline 15 at eval, 20 on 05/16/2020    Time 4    Period Weeks    Status On-going      PT SHORT TERM GOAL #4   Title Pt will report he is able to touch his left jawline with 0/10 pain    Time 8    Period Weeks    Status New             PT Long Term Goals - 05/16/20 1615      PT LONG TERM GOAL #1   Title Pt. will have jaw opening of 30 mm for improved ability to eat    Baseline 15 mm on eval, 20 mm on 05/16/2020    Time 8    Period Weeks    Status On-going      PT LONG TERM GOAL #2   Title Right lateral jaw excursion will be equal to left (8 mm) or better    Baseline 3 on eval, 6 on 05/16/2020    Time 8    Period Weeks    Status On-going      PT LONG TERM GOAL #3   Title Left jaw pain/tenderness improved by 50% or more    Baseline 5/10 on eval, 2/10 on 10/ 28/2021    Status Achieved      PT LONG TERM GOAL #4   Title Pt will report he is able to touch his left jawline with 0/10 pain    Baseline 2/10 on 05/16/2020    Time 8    Period Weeks                 Plan - 06/10/20 1644    Clinical Impression Statement pt arrives continues report of 1/10 pain and measures 35mm of mandibular depression. continued TPDN focusing on SCM, masseter, and lateral pterygoid follwed with IASTM techniques. worked on mandibular inferior and anterior mobs to promote depression and low load long duration stretching. He improved opening to 9mm end of session. reviewed importance of frequently stretch and using multiple tongue depressors for resting position.  PT Treatment/Interventions ADLs/Self Care Home Management;Therapeutic exercise;Manual lymph drainage;Manual techniques;Taping;Patient/family education;Passive range of motion;Joint  Manipulations;Compression bandaging;Dry needling;Moist Heat;Vasopneumatic Device;Therapeutic activities    PT Next Visit Plan response to DN, dry needling and exercise, continue with soft tissue and manual work with ROM    PT Home Exercise Plan Continue therabite, cervical ROM/stretches, Jaw stretches    Consulted and Agree with Plan of Care Patient           Patient will benefit from skilled therapeutic intervention in order to improve the following deficits and impairments:  Postural dysfunction, Decreased strength, Increased edema, Decreased knowledge of precautions, Pain, Increased fascial restricitons, Decreased range of motion, Increased muscle spasms  Visit Diagnosis: Disorder of the skin and subcutaneous tissue related to radiation, unspecified  Abnormal posture  Cervicalgia     Problem List Patient Active Problem List   Diagnosis Date Noted  . Encounter for cardiac risk counseling 11/08/2019  . Hypokalemia 08/21/2019  . Warfarin anticoagulation 02/20/2019  . Hypercholesteremia 02/07/2019  . Recurrent deep vein thrombosis (DVT) ([place]) 02/06/2019  . Thoracic ascending aortic aneurysm ([place]) 02/06/2019  . Port-A-Cath in place 01/18/2019  . Mucositis due to chemotherapy 12/07/2018  . Hypomagnesemia 12/07/2018  . Thrush 12/07/2018  . Malignant neoplasm of tonsillar fossa ([place]) 10/17/2018  . 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 DVT, lower extremity, proximal ([place]) 05/24/2013  . Chronic anticoagulation 01/02/2013  . Superficial thrombosis of lower extremity 05/02/2012  . Benign essential HTN 01/04/2012  . Hyperlipidemia, mixed 01/04/2012   Starr Lake PT, DPT, LAT, ATC  06/10/20  5:35 PM      [facility] [address] [place], [place], [address] Phone: [phone]   Fax:  [phone]  Name: SHASHANK KWASNIK MRN: [id number] Date  of Birth: 17-Jan-1961

## 2020-06-28 ENCOUNTER — Telehealth: Payer: Self-pay | Admitting: Adult Health Nurse Practitioner

## 2020-06-28 NOTE — Telephone Encounter (Signed)
Spoke with patient and scheduled visit for 07/09/20 at 3pm [name] [name] K. Olena Heckle NP

## 2020-07-01 DIAGNOSIS — L03211 Cellulitis of face: Secondary | ICD-10-CM | POA: Diagnosis not present

## 2020-07-02 ENCOUNTER — Other Ambulatory Visit: Payer: Self-pay

## 2020-07-02 ENCOUNTER — Ambulatory Visit: Payer: Medicaid Other | Attending: Radiation Oncology | Admitting: Physical Therapy

## 2020-07-02 DIAGNOSIS — L599 Disorder of the skin and subcutaneous tissue related to radiation, unspecified: Secondary | ICD-10-CM | POA: Insufficient documentation

## 2020-07-02 DIAGNOSIS — M6249 Contracture of muscle, multiple sites: Secondary | ICD-10-CM | POA: Insufficient documentation

## 2020-07-02 DIAGNOSIS — R293 Abnormal posture: Secondary | ICD-10-CM | POA: Insufficient documentation

## 2020-07-02 NOTE — Therapy (Addendum)
[place], [place], [address] Phone: [phone]   Fax:  [phone]  Physical Therapy Treatment / Discharge  Patient Details  Name: JAMARKUS LISBON MRN: [id number] Date of Birth: 1960-10-07 Referring Provider (PT): Reita May Date: 07/02/2020   PT End of Session - 07/02/20 0940    Visit Number 11    Number of Visits 25    Date for PT Re-Evaluation 07/19/20    Authorization Type [facility] Medicaid    Authorization Time Period 05/20/2020 - 07/19/2020    Authorization - Visit Number 11    Authorization - Number of Visits 32    PT Start Time 7680    Activity Tolerance Patient tolerated treatment well    Behavior During Therapy [facility] for tasks assessed/performed           Past Medical History:  Diagnosis Date  . 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 Benign essential HTN 01/04/2012  . Cancer ([place])    tonsilar  . Chronic anticoagulation 01/02/2013  . Dermatofibroma of forearm 01/02/2013   Left side  . Hyperlipidemia, mixed 01/04/2012  . 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 Primary hypercoagulable state ([place]) 10/01/2014  . Sinus bradycardia, chronic 01/04/2012  . Superficial thrombosis of lower extremity 05/02/2012    Past Surgical History:  Procedure Laterality Date  . DIRECT LARYNGOSCOPY Left 10/19/2018   Procedure: DIRECT LARYNGOSCOPY;  Surgeon: Leta Baptist, MD;  Location: [place];  Service: ENT;  Laterality: Left;  . IR IMAGING GUIDED PORT INSERTION  11/04/2018  . IR REMOVAL TUN ACCESS W/ PORT W/O FL MOD SED  05/01/2019  . TONSILLECTOMY Left 10/19/2018   Procedure: BIOPSY OF LEFT TONSIL;  Surgeon: Leta Baptist, MD;  Location: [place];  Service: ENT;  Laterality: Left;    There were no  vitals filed for this visit.                                PT Short Term Goals - 05/16/20 1622      PT SHORT TERM GOAL #1   Title Pt. will be independent with HEP to improve jaw ROM and pain    Status Achieved      PT SHORT TERM GOAL #2   Title Pt will report there is decreased tissue tension left SCM/left jaw byt 25%    Baseline 05/16/2020 pt states it doesn't seem to be quite as bad but it is still present    Time 4    Status On-going      PT SHORT TERM GOAL #3   Title Pt. will have increased jaw opening to 23 mm for improved eating ability    Baseline 15 at eval, 20 on 05/16/2020    Time 4    Period Weeks    Status On-going      PT SHORT TERM GOAL #4   Title Pt will report he is able to touch his left jawline with 0/10 pain    Time 8    Period Weeks    Status New             PT Long Term Goals - 05/16/20 1615      PT LONG TERM GOAL #  1   Title Pt. will have jaw opening of 30 mm for improved ability to eat    Baseline 15 mm on eval, 20 mm on 05/16/2020    Time 8    Period Weeks    Status On-going      PT LONG TERM GOAL #2   Title Right lateral jaw excursion will be equal to left (8 mm) or better    Baseline 3 on eval, 6 on 05/16/2020    Time 8    Period Weeks    Status On-going      PT LONG TERM GOAL #3   Title Left jaw pain/tenderness improved by 50% or more    Baseline 5/10 on eval, 2/10 on 10/ 28/2021    Status Achieved      PT LONG TERM GOAL #4   Title Pt will report he is able to touch his left jawline with 0/10 pain    Baseline 2/10 on 05/16/2020    Time 8    Period Weeks                 Plan - 07/02/20 1709    Clinical Impression Statement pt arrived reporting he had gone to urgent care yesterday due to a cyst that had rupturedon the L posterior mandibule (see media for images) which he was given an antibiotic and had 1 dose since starting. The skin appears red with some purulent discharge noted, and reports  increased pain in the jaw. I discussed with him holding off on treatment today since we would be focusing on his Jaw and that since there is an apparent infection that we would not be dry needling. We decided to hold of treatment today and Thursday and reassess on Monday the 20th. I encouraged pt to call his treating oncologist to follow up.           Patient will benefit from skilled therapeutic intervention in order to improve the following deficits and impairments:  Postural dysfunction,Decreased strength,Increased edema,Decreased knowledge of precautions,Pain,Increased fascial restricitons,Decreased range of motion,Increased muscle spasms  Visit Diagnosis: No diagnosis found.     Problem List Patient Active Problem List   Diagnosis Date Noted  . Encounter for cardiac risk counseling 11/08/2019  . Hypokalemia 08/21/2019  . Warfarin anticoagulation 02/20/2019  . Hypercholesteremia 02/07/2019  . Recurrent deep vein thrombosis (DVT) ([place]) 02/06/2019  . Thoracic ascending aortic aneurysm ([place]) 02/06/2019  . Port-A-Cath in place 01/18/2019  . Mucositis due to chemotherapy 12/07/2018  . Hypomagnesemia 12/07/2018  . Thrush 12/07/2018  . Malignant neoplasm of tonsillar fossa ([place]) 10/17/2018  . 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 DVT, lower extremity, proximal ([place]) 05/24/2013  . Chronic anticoagulation 01/02/2013  . Superficial thrombosis of lower extremity 05/02/2012  . Benign essential HTN 01/04/2012  . Hyperlipidemia, mixed 01/04/2012   Starr Lake PT, DPT, LAT, ATC  07/02/20  5:18 PM      [facility] [address] [place], [place], [address] Phone: [phone]   Fax:  [phone]  Name: KORDE JEPPSEN MRN: [id number] Date of Birth: 13-Oct-1960      PHYSICAL THERAPY DISCHARGE SUMMARY  Visits from Start of Care: 11  Current  functional level related to goals / functional outcomes: See goals   Remaining deficits: Current status unknown   Education / Equipment: HEP  Plan: Patient agrees to discharge.  Patient goals were not met. Patient is being  discharged due to not returning since the last visit.  ?????         Mindy Behnken PT, DPT, LAT, ATC  11/23/20  4:15 PM

## 2020-07-04 ENCOUNTER — Encounter: Payer: Medicaid Other | Admitting: Physical Therapy

## 2020-07-05 ENCOUNTER — Telehealth: Payer: Self-pay | Admitting: Hematology and Oncology

## 2020-07-05 NOTE — Telephone Encounter (Signed)
R/s 1/3 appt per provider on call schedule. Called and left msg. Mailed printout

## 2020-07-08 ENCOUNTER — Encounter: Payer: Medicaid Other | Admitting: Physical Therapy

## 2020-07-08 ENCOUNTER — Telehealth: Payer: Self-pay | Admitting: Hematology and Oncology

## 2020-07-08 NOTE — Telephone Encounter (Signed)
Received call from patient to reschedule appointment for afternoon. Rescheduled appointments per patient's request to first available afternoon appointments.

## 2020-07-09 ENCOUNTER — Other Ambulatory Visit: Payer: Self-pay

## 2020-07-09 ENCOUNTER — Other Ambulatory Visit: Payer: Medicaid Other | Admitting: Adult Health Nurse Practitioner

## 2020-07-09 DIAGNOSIS — C090 Malignant neoplasm of tonsillar fossa: Secondary | ICD-10-CM | POA: Diagnosis not present

## 2020-07-09 DIAGNOSIS — M272 Inflammatory conditions of jaws: Secondary | ICD-10-CM

## 2020-07-09 DIAGNOSIS — Z515 Encounter for palliative care: Secondary | ICD-10-CM | POA: Diagnosis not present

## 2020-07-09 NOTE — Progress Notes (Signed)
[place] Consult Note Telephone: [phone]  Fax: [phone]  PATIENT NAME: [name] [name] DOB: May 22, 1961 MRN: [id number]  PRIMARY CARE PROVIDER:   [name], MD  REFERRING PROVIDER:  [name], MD [address],  [place] [address]  RESPONSIBLE PARTY:   self [name] [name], [name] [name]: [phone] C: [phone]  Chief complaint: follow up palliative visit/pain    RECOMMENDATIONS and PLAN:  1.  Advanced care planning. Full code  2.  Pain of jaw due to radiation treatment for tonsillar cancer.  Patient continues to get relief with oxycodone 5 mg Q8hrs PRN, flexeril, and gabapentin 100 mg Q8 hrs PRN.  Does not need any refills today.  3.  Abscess of left jaw.  Patient has last dose of clindamycin today.  He is still having purulent drainage and tenderness. Eprescribed bactrim 400/60 Q12 hours for 5 days.  Discussed keeping the abscess open to allow draining and to keep covered with gauze to control drainage.  Can use neosporin after drainage stops if needed while opening of abscess closes.  Palliative will continue to monitor for symptom management/decline and make recommendations as needed.  Will call in one week to follow up on abscess and schedule follow up at that time.  I spent 60 minutes providing this consultation. More than 50% of the time in this consultation was spent coordinating communication.   HISTORY OF PRESENT ILLNESS:  [name] [name] is a 59 y.o. year old male with multiple medical problems including tonsillar cancer,HTN, HLD, h/o DVT and PE on coumadin. Palliative Care was asked to help address goals of care. Patient has been having increased pain to left jaw due to abscess.  Was seen at Urgent care 9 days ago and started on clindamycin.  Patient has had drainage and has been letting it scab over.  He is still having drainage with blood noted at times in the drainage.  Pain has  improved with the clindamycin but is still having tenderness as he still has induration noted around the abscess.  Has been undergoing oncology rehab for his jaw.  They have been wanting to do dry needle procedure but unable to do so while abscess is draining.  Rest of 10 point ROS asked and negative.  CODE STATUS: full code  PPS: 50% HOSPICE ELIGIBILITY/DIAGNOSIS: TBD  PHYSICAL EXAM:   General: patient in NAD Eyes: sclera anicteric and noninjected with no discharge noted ENMT:  Abscess to left side with induration noted around opening with blood tinged purulent drainage noted Extremities: no edema, no joint deformities Skin: no rashes on exposed skin Neurological:  nonfocal   PAST MEDICAL HISTORY:  Past Medical History:  Diagnosis Date  . 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 Benign essential HTN 01/04/2012  . Cancer ([place])    tonsilar  . Chronic anticoagulation 01/02/2013  . Dermatofibroma of forearm 01/02/2013   Left side  . Hyperlipidemia, mixed 01/04/2012  . 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 Primary hypercoagulable state ([place]) 10/01/2014  . Sinus bradycardia, chronic 01/04/2012  . Superficial thrombosis of lower extremity 05/02/2012    SOCIAL HX:  Social History   Tobacco Use  . Smoking status: Former Smoker    Packs/day: 0.50    Years: 30.00    Pack years: 15.00  Types: Cigarettes    Quit date: 10/19/2018    Years since quitting: 1.7  . Smokeless tobacco: Never Used  Substance Use Topics  . Alcohol use: Yes    Alcohol/week: 13.0 - 14.0 standard drinks    Types: 12 Cans of beer, 1 - 2 Shots of liquor per week    Comment: 1-2 times per week.    ALLERGIES: No Known Allergies   PERTINENT MEDICATIONS:  Outpatient Encounter Medications as of 07/09/2020  Medication Sig  . atorvastatin  (LIPITOR) 20 MG tablet Take 1 tablet (20 mg total) by mouth daily.  . cyclobenzaprine (FLEXERIL) 10 MG tablet TAKE 1 TABLET BY MOUTH THREE TIMES A DAY AS NEEDED FOR MUSCLE SPASMS  . gabapentin (NEURONTIN) 100 MG capsule Take 200 mg by mouth every 8 (eight) hours as needed.  Marland Kitchen glycopyrrolate (ROBINUL) 1 MG tablet Take 1-2 tablet by mouth every eight hours as needed take 1-2 tabs every eight hours as needed for secretions (Patient not taking: Reported on 04/15/2020)  . ibuprofen (ADVIL) 200 MG tablet Take 600 mg by mouth every 6 (six) hours as needed for pain or fever.   . metoprolol succinate (TOPROL-XL) 25 MG 24 hr tablet TAKE 1 TABLET BY MOUTH EVERY DAY  . oxyCODONE (OXY IR/ROXICODONE) 5 MG immediate release tablet Take 5 mg by mouth every 8 (eight) hours as needed.  . pentoxifylline (TRENTAL) 400 MG CR tablet Take 400mg  tab daily x 1 week, then 400mg  BID; take with food. Upset stomach is rare on this med, but stop if you have upset stomach.  . sodium fluoride (PREVIDENT 5000 PLUS) 1.1 % CREA dental cream Apply to tooth brush. Brush teeth for 2 minutes. Spit out excess. DO NOT rinse afterwards. Repeat nightly.  . vitamin E 180 MG (400 UNITS) capsule Take 400 IU daily x 1 week, then 400 IU BID  . warfarin (COUMADIN) 10 MG tablet Take 1 tablet of 10 mg warfarin daily except on Sundays, Thursdays and Saturdays, take only 1/2 tablet (5 mg).   No facility-administered encounter medications on file as of 07/09/2020.     [name] [name] 10-15-1991, NP

## 2020-07-10 ENCOUNTER — Ambulatory Visit: Payer: Medicaid Other | Admitting: Physical Therapy

## 2020-07-16 ENCOUNTER — Telehealth: Payer: Self-pay | Admitting: Adult Health Nurse Practitioner

## 2020-07-16 NOTE — Telephone Encounter (Signed)
Called to follow up on abscess of left jaw.  Left VM with reason for call and call back info Geniene List K. Garner Nash NP

## 2020-07-18 ENCOUNTER — Telehealth: Payer: Self-pay | Admitting: Adult Health Nurse Practitioner

## 2020-07-18 NOTE — Telephone Encounter (Signed)
Patient's significant other called about abscess still draining though it is draining less.  He has finished round of clindamycin and bactrim.  Have agreed to wait over the weekend to see if it will stop draining. Have telehealth appointment this coming Monday to reevaluate and see if more antibiotics are needed.  Do not want to add more antibiotics unless absolutely necessary to avoid resistance.  Bernie Fobes K. Garner Nash NP

## 2020-07-20 DIAGNOSIS — C024 Malignant neoplasm of lingual tonsil: Secondary | ICD-10-CM | POA: Diagnosis not present

## 2020-07-22 ENCOUNTER — Ambulatory Visit: Payer: Medicaid Other | Admitting: Hematology and Oncology

## 2020-07-22 ENCOUNTER — Other Ambulatory Visit: Payer: Medicaid Other

## 2020-07-24 ENCOUNTER — Other Ambulatory Visit: Payer: Medicaid Other

## 2020-07-24 ENCOUNTER — Ambulatory Visit: Payer: Medicaid Other | Admitting: Hematology and Oncology

## 2020-07-26 ENCOUNTER — Telehealth: Payer: Self-pay

## 2020-07-26 NOTE — Telephone Encounter (Signed)
Received message from patient regarding cyst that remains on his jaw. Returned call to patient. VM left with call back information

## 2020-07-26 NOTE — Telephone Encounter (Signed)
Received return call from patient regarding cyst on jaw. Patient reported that cyst continues to drain. Area is firm and painful. He is scheduled to see his doctor in one week. Scheduled follow up visit with Palliative NP for 08/09/20. Hanley Ben NP updated.

## 2020-07-31 ENCOUNTER — Other Ambulatory Visit: Payer: Medicaid Other | Admitting: Adult Health Nurse Practitioner

## 2020-07-31 ENCOUNTER — Other Ambulatory Visit: Payer: Self-pay

## 2020-07-31 DIAGNOSIS — M272 Inflammatory conditions of jaws: Secondary | ICD-10-CM

## 2020-07-31 DIAGNOSIS — C090 Malignant neoplasm of tonsillar fossa: Secondary | ICD-10-CM

## 2020-07-31 NOTE — Progress Notes (Signed)
[facility] Consult Note Telephone: [phone]  Fax: [phone]  PATIENT NAME: [name] [name] DOB: Feb 09, 1961 MRN: [id number]  PRIMARY CARE PROVIDER:   [name], MD  REFERRING PROVIDER:  [name], MD [address] [place],  [place] [address]  RESPONSIBLE PARTY:   self [phone] Thane Edu: [phone] C: [phone]  Chief complaint: follow up palliative visit/pain   Due to the COVID-19 crisis, this visit was done via telemedicine and it was initiated and consent by this patient and or family. Video-audio (telehealth) contact was unable to be done due to technical barriers from the patient's side.  RECOMMENDATIONS and PLAN:  1.  Advanced care planning. Full code  2. Abscess.  Patient states that antibiotic seems to helping.  The abscess is draining less but is still firm around the edges and is tender per patient.  He has doctor's appointment this Friday.  Did discuss that if this round of antibiotics does not help that he will probably have to have I&D done.  Do have follow up visit with patient in 10 days  3.  Pain.  Patient having some increased with pain with abscess to jaw.  Needs refill of oxycodone.  Checked [place] substance abuse database.  Refilled oxycodone 5 mg Q8 hrs PRN for pain.  [facility]script [facility] in [place].  I spent 25 minutes providing this consultation. More than 50% of the time in this consultation was spent coordinating communication.   HISTORY OF PRESENT ILLNESS:  [name] [name] is a 60 y.o. year old male with multiple medical problems including including tonsillar cancer,HTN, HLD, h/o DVT and PE on coumadin. Palliative Care was asked to help address goals of care.   CODE STATUS: full code  PPS: 50% HOSPICE ELIGIBILITY/DIAGNOSIS: TBD  PHYSICAL EXAM:   Deferred   PAST MEDICAL HISTORY:  Past Medical History:  Diagnosis Date  . 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 Benign essential HTN 01/04/2012  . Cancer ([place])    tonsilar  . Chronic anticoagulation 01/02/2013  . Dermatofibroma of forearm 01/02/2013   Left side  . Hyperlipidemia, mixed 01/04/2012  . 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 Primary hypercoagulable state ([place]) 10/01/2014  . Sinus bradycardia, chronic 01/04/2012  . Superficial thrombosis of lower extremity 05/02/2012    SOCIAL HX:  Social History   Tobacco Use  . Smoking status: Former Smoker    Packs/day: 0.50    Years: 30.00    Pack years: 15.00    Types: Cigarettes    Quit date: 10/19/2018    Years since quitting: 1.7  . Smokeless tobacco: Never Used  Substance Use Topics  . Alcohol use: Yes    Alcohol/week: 13.0 - 14.0 standard drinks    Types: 12 Cans of beer, 1 - 2 Shots of liquor per week    Comment: 1-2 times per week.    ALLERGIES: No Known Allergies   PERTINENT MEDICATIONS:  Outpatient Encounter Medications as of 07/31/2020  Medication Sig  . atorvastatin (LIPITOR) 20 MG tablet Take 1 tablet (20 mg total) by mouth daily.  . cyclobenzaprine (FLEXERIL) 10 MG tablet TAKE 1 TABLET BY MOUTH THREE TIMES A DAY AS NEEDED FOR MUSCLE SPASMS  . gabapentin (NEURONTIN) 100 MG capsule Take 200 mg by mouth  every 8 (eight) hours as needed.  Marland Kitchen glycopyrrolate (ROBINUL) 1 MG tablet Take 1-2 tablet by mouth every eight hours as needed take 1-2 tabs every eight hours as needed for secretions (Patient not taking: Reported on 04/15/2020)  . ibuprofen (ADVIL) 200 MG tablet Take 600 mg by mouth every 6 (six) hours as needed for pain or fever.   . metoprolol succinate (TOPROL-XL) 25 MG 24 hr tablet TAKE 1 TABLET BY MOUTH EVERY DAY  . oxyCODONE (OXY IR/ROXICODONE) 5 MG immediate release tablet Take 5 mg by mouth every 8 (eight) hours as needed.  . pentoxifylline  (TRENTAL) 400 MG CR tablet Take 400mg  tab daily x 1 week, then 400mg  BID; take with food. Upset stomach is rare on this med, but stop if you have upset stomach.  . sodium fluoride (PREVIDENT 5000 PLUS) 1.1 % CREA dental cream Apply to tooth brush. Brush teeth for 2 minutes. Spit out excess. DO NOT rinse afterwards. Repeat nightly.  . vitamin E 180 MG (400 UNITS) capsule Take 400 IU daily x 1 week, then 400 IU BID  . warfarin (COUMADIN) 10 MG tablet Take 1 tablet of 10 mg warfarin daily except on Sundays, Thursdays and Saturdays, take only 1/2 tablet (5 mg).   No facility-administered encounter medications on file as of 07/31/2020.     Damaya Channing Jenetta Downer, NP

## 2020-08-02 ENCOUNTER — Inpatient Hospital Stay: Payer: Medicaid Other | Attending: Hematology and Oncology

## 2020-08-02 ENCOUNTER — Other Ambulatory Visit: Payer: Self-pay

## 2020-08-02 ENCOUNTER — Inpatient Hospital Stay (HOSPITAL_BASED_OUTPATIENT_CLINIC_OR_DEPARTMENT_OTHER): Payer: Medicaid Other | Admitting: Hematology and Oncology

## 2020-08-02 ENCOUNTER — Other Ambulatory Visit: Payer: Self-pay | Admitting: *Deleted

## 2020-08-02 DIAGNOSIS — Z7901 Long term (current) use of anticoagulants: Secondary | ICD-10-CM | POA: Insufficient documentation

## 2020-08-02 DIAGNOSIS — C090 Malignant neoplasm of tonsillar fossa: Secondary | ICD-10-CM

## 2020-08-02 DIAGNOSIS — Z79899 Other long term (current) drug therapy: Secondary | ICD-10-CM | POA: Diagnosis not present

## 2020-08-02 DIAGNOSIS — C099 Malignant neoplasm of tonsil, unspecified: Secondary | ICD-10-CM | POA: Diagnosis not present

## 2020-08-02 DIAGNOSIS — B370 Candidal stomatitis: Secondary | ICD-10-CM | POA: Insufficient documentation

## 2020-08-02 DIAGNOSIS — I82402 Acute embolism and thrombosis of unspecified deep veins of left lower extremity: Secondary | ICD-10-CM | POA: Diagnosis not present

## 2020-08-02 DIAGNOSIS — Z87891 Personal history of nicotine dependence: Secondary | ICD-10-CM | POA: Insufficient documentation

## 2020-08-02 DIAGNOSIS — L989 Disorder of the skin and subcutaneous tissue, unspecified: Secondary | ICD-10-CM | POA: Diagnosis not present

## 2020-08-02 LAB — CMP (CANCER CENTER ONLY)
ALT: 18 U/L (ref 0–44)
AST: 21 U/L (ref 15–41)
Albumin: 3.8 g/dL (ref 3.5–5.0)
Alkaline Phosphatase: 100 U/L (ref 38–126)
Anion gap: 8 (ref 5–15)
BUN: 11 mg/dL (ref 6–20)
CO2: 29 mmol/L (ref 22–32)
Calcium: 9.8 mg/dL (ref 8.9–10.3)
Chloride: 104 mmol/L (ref 98–111)
Creatinine: 0.92 mg/dL (ref 0.61–1.24)
GFR, Estimated: >60 mL/min (ref >60)
Glucose, Bld: 96 mg/dL (ref 70–99)
Potassium: 4.6 mmol/L (ref 3.5–5.1)
Sodium: 141 mmol/L (ref 135–145)
Total Bilirubin: 0.7 mg/dL (ref 0.3–1.2)
Total Protein: 7.4 g/dL (ref 6.5–8.1)

## 2020-08-02 LAB — CBC WITH DIFFERENTIAL (CANCER CENTER ONLY)
Abs Immature Granulocytes: 0.02 10*3/uL (ref 0.00–0.07)
Basophils Absolute: 0 10*3/uL (ref 0.0–0.1)
Basophils Relative: 1 %
Eosinophils Absolute: 0.3 10*3/uL (ref 0.0–0.5)
Eosinophils Relative: 4 %
HCT: 46.5 % (ref 39.0–52.0)
Hemoglobin: 15.6 g/dL (ref 13.0–17.0)
Immature Granulocytes: 0 %
Lymphocytes Relative: 16 %
Lymphs Abs: 1 10*3/uL (ref 0.7–4.0)
MCH: 32.7 pg (ref 26.0–34.0)
MCHC: 33.5 g/dL (ref 30.0–36.0)
MCV: 97.5 fL (ref 80.0–100.0)
Monocytes Absolute: 0.7 10*3/uL (ref 0.1–1.0)
Monocytes Relative: 11 %
Neutro Abs: 4.1 10*3/uL (ref 1.7–7.7)
Neutrophils Relative %: 68 %
Platelet Count: 182 10*3/uL (ref 150–400)
RBC: 4.77 MIL/uL (ref 4.22–5.81)
RDW: 14.1 % (ref 11.5–15.5)
WBC Count: 6.1 10*3/uL (ref 4.0–10.5)
nRBC: 0 % (ref 0.0–0.2)

## 2020-08-02 NOTE — Progress Notes (Signed)
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 Left tonsil prominence with two Level II LN's (largest 2.2cm) and at least one Level IV LN (76mm);  no metastasisi ? Tonsil bx by Dr. Benjamine Mola, invasive SCCa, p16+; not a candidate for TORS -Late 10/2018 - 12/2018: definitive chemoradiation with weekly cisplatin  ? 03/2019: end-of-treatment PET showed decrease in FDG avidity in the left tonsil but some residual soft tissue fullness, resolution of left LN disease   -2021:  ? Diffuse pharyngeal soft tissue thickening at the tongue base on multiple CT's  ? Necrotic tissue on multiple biopsies at [facility], suggestive of radionecrosis; no evidence of malignancy   #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 -12/2018: acute DVT involving R peroneal vein and L gastrocnemius vein; new arterial thrombosis involving R common femoral artery and popliteal arteries, due to artery aneurysm -Late 12/2018: progression of acute DVT in the LLE from gastrocnemius vein to the femoral, popliteal, posterior tibial and peroneal veins despite being on Eliquis  -Currently on warfarin   Interval History:  [name] [name] 60 y.o. male with medical history significant for squamous cell carcinoma of left tonsil presents for a  follow up visit. The patient's last visit was on 04/15/2020. In the interim since the last visit [name] [name] has developed a draining abscess on the left side of his neck.    On exam today [name] [name] notes he has had continuous difficulty with this lesion on his neck for about 6 weeks since the beginning of December.  He notes that there is occasionally brownish/yellowish drainage and that he can feel the lesion the size of a marble in his neck.  He reports that he is taken 3 different antibiotics including Keflex, Bactrim, and clindamycin with no relief.  Fortunately has not had any systemic symptoms of fevers, chills, sweats, nausea, vomiting or diarrhea.  He does still have issues with sore throat and difficulty with opening his mouth all the way.  He did some dry needling and rehab to try to help with this but has not had much relief.  He otherwise denies having any constipation, lethargy, or other concerning symptoms.  A full 10 point ROS is listed below.  MEDICAL HISTORY:  Past Medical History:  Diagnosis Date  . 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 Benign essential HTN 01/04/2012  . Cancer ([place])    tonsilar  . Chronic anticoagulation 01/02/2013  . Dermatofibroma of forearm 01/02/2013   Left side  . Hyperlipidemia, mixed 01/04/2012  . 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 Primary hypercoagulable state ([place]) 10/01/2014  . Sinus bradycardia, chronic 01/04/2012  . Superficial thrombosis of lower extremity 05/02/2012    SURGICAL HISTORY: Past Surgical History:  Procedure Laterality Date  .  DIRECT LARYNGOSCOPY Left 10/19/2018   Procedure: DIRECT LARYNGOSCOPY;  Surgeon: Leta Baptist, MD;  Location: [facility];  Service: ENT;  Laterality: Left;  . IR IMAGING GUIDED PORT INSERTION  11/04/2018  . IR REMOVAL TUN ACCESS  W/ PORT W/O FL MOD SED  05/01/2019  . TONSILLECTOMY Left 10/19/2018   Procedure: BIOPSY OF LEFT TONSIL;  Surgeon: Leta Baptist, MD;  Location: [facility];  Service: ENT;  Laterality: Left;    SOCIAL HISTORY: Social History   Socioeconomic History  . Marital status: Divorced    Spouse name: Not on file  . Number of children: 2  . Years of education: Not on file  . Highest education level: Not on file  Occupational History  . Not on file  Tobacco Use  . Smoking status: Former Smoker    Packs/day: 0.50    Years: 30.00    Pack years: 15.00    Types: Cigarettes    Quit date: 10/19/2018    Years since quitting: 1.7  . Smokeless tobacco: Never Used  Vaping Use  . Vaping Use: Former  . Substances: Nicotine, Flavoring  . Devices: 1 cartridge q2-3 days, he tells me he is not using.   Substance and Sexual Activity  . Alcohol use: Yes    Alcohol/week: 13.0 - 14.0 standard drinks    Types: 12 Cans of beer, 1 - 2 Shots of liquor per week    Comment: 1-2 times per week.  . Drug use: No  . Sexual activity: Not on file  Other Topics Concern  . Not on file  Social History Narrative  . Not on file   Social Determinants of Health   Financial Resource Strain: Not on file  Food Insecurity: Not on file  Transportation Needs: Not on file  Physical Activity: Not on file  Stress: Not on file  Social Connections: Not on file  Intimate Partner Violence: Not on file    FAMILY HISTORY: Family History  Problem Relation Age of Onset  . Stroke Father     ALLERGIES:  has No Known Allergies.  MEDICATIONS:  Current Outpatient Medications  Medication Sig Dispense Refill  . pentoxifylline (TRENTAL) 400 MG CR tablet Take 400mg  tab daily x 1 week, then 400mg  BID; take with food. Upset stomach is rare on this med, but stop if you have upset stomach. 60 tablet 5  . atorvastatin (LIPITOR) 20 MG tablet Take 1 tablet (20 mg total) by mouth daily. 30 tablet 11  . cyclobenzaprine (FLEXERIL)  10 MG tablet TAKE 1 TABLET BY MOUTH THREE TIMES A DAY AS NEEDED FOR MUSCLE SPASMS 30 tablet 0  . gabapentin (NEURONTIN) 100 MG capsule Take 200 mg by mouth every 8 (eight) hours as needed.    Marland Kitchen glycopyrrolate (ROBINUL) 1 MG tablet Take 1-2 tablet by mouth every eight hours as needed take 1-2 tabs every eight hours as needed for secretions (Patient not taking: Reported on 04/15/2020)    . ibuprofen (ADVIL) 200 MG tablet Take 600 mg by mouth every 6 (six) hours as needed for pain or fever.     . metoprolol succinate (TOPROL-XL) 25 MG 24 hr tablet TAKE 1 TABLET BY MOUTH EVERY DAY 90 tablet 1  . oxyCODONE (OXY IR/ROXICODONE) 5 MG immediate release tablet Take 5 mg by mouth every 8 (eight) hours as needed.    . sodium fluoride (PREVIDENT 5000 PLUS) 1.1 % CREA dental cream Apply to tooth brush. Brush teeth for 2 minutes. Spit out excess.  DO NOT rinse afterwards. Repeat nightly. 1 Tube prn  . vitamin E 180 MG (400 UNITS) capsule Take 400 IU daily x 1 week, then 400 IU BID (Patient not taking: Reported on 08/02/2020) 60 capsule 5  . warfarin (COUMADIN) 10 MG tablet Take 1 tablet of 10 mg warfarin daily except on Sundays, Thursdays and Saturdays, take only 1/2 tablet (5 mg). 72 tablet 2   No current facility-administered medications for this visit.    REVIEW OF SYSTEMS:   Constitutional: ( - ) fevers, ( - )  chills , ( - ) night sweats Eyes: ( - ) blurriness of vision, ( - ) double vision, ( - ) watery eyes Ears, nose, mouth, throat, and face: ( - ) mucositis, ( - ) sore throat Respiratory: ( - ) cough, ( - ) dyspnea, ( - ) wheezes Cardiovascular: ( - ) palpitation, ( - ) chest discomfort, ( - ) lower extremity swelling Gastrointestinal:  ( - ) nausea, ( - ) heartburn, ( - ) change in bowel habits Skin: ( - ) abnormal skin rashes Lymphatics: ( - ) new lymphadenopathy, ( - ) easy bruising Neurological: ( - ) numbness, ( - ) tingling, ( - ) new weaknesses Behavioral/Psych: ( - ) mood change, ( - ) new  changes  All other systems were reviewed with the patient and are negative.  PHYSICAL EXAMINATION: ECOG PERFORMANCE STATUS: 1 - Symptomatic but completely ambulatory  Vitals:   08/02/20 1615  BP: (!) 125/99  Pulse: 78  Resp: 18  Temp: 97.7 F (36.5 C)  SpO2: 99%   Filed Weights   08/02/20 1615  Weight: 227 lb 8 oz (103.2 kg)    GENERAL: well appearing middle aged Caucasian male alert, no distress and comfortable SKIN: skin color, texture, turgor are normal, no rashes or significant lesions EYES: conjunctiva are pink and non-injected, sclera clear OROPHARYNX: exam limited as patient cannot open his mouth wide. Thick salivations noted  LUNGS: clear to auscultation and percussion with normal breathing effort HEART: regular rate & rhythm and no murmurs and no lower extremity edema Musculoskeletal: no cyanosis of digits and no clubbing  PSYCH: alert & oriented x 3, fluent speech NEURO: no focal motor/sensory deficits  LABORATORY DATA:  I have reviewed the data as listed CBC Latest Ref Rng & Units 08/02/2020 04/15/2020 01/12/2020  WBC 4.0 - 10.5 K/uL 6.1 5.7 5.0  Hemoglobin 13.0 - 17.0 g/dL 15.6 15.1 15.1  Hematocrit 39.0 - 52.0 % 46.5 44.2 46.1  Platelets 150 - 400 K/uL 182 192 212    CMP Latest Ref Rng & Units 08/02/2020 04/15/2020 01/12/2020  Glucose 70 - 99 mg/dL 96 95 84  BUN 6 - 20 mg/dL 11 12 8   Creatinine 0.61 - 1.24 mg/dL 0.92 0.81 0.86  Sodium 135 - 145 mmol/L 141 139 140  Potassium 3.5 - 5.1 mmol/L 4.6 4.0 4.3  Chloride 98 - 111 mmol/L 104 105 103  CO2 22 - 32 mmol/L 29 28 27   Calcium 8.9 - 10.3 mg/dL 9.8 9.6 9.5  Total Protein 6.5 - 8.1 g/dL 7.4 7.0 6.9  Total Bilirubin 0.3 - 1.2 mg/dL 0.7 0.6 0.3  Alkaline Phos 38 - 126 U/L 100 109 101  AST 15 - 41 U/L 21 15 11(L)  ALT 0 - 44 U/L 18 11 9     RADIOGRAPHIC STUDIES: No results found.  ASSESSMENT & PLAN ALONSO WELSH 60 y.o. male with medical history significant for squamous cell carcinoma of left tonsil  presents for a follow up visit.  After review the labs, discussion with the patient, and review of the prior records the patient's findings are most consistent with squamous cell carcinoma of the left tonsil status post definitive treatment with chemoradiation.  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  The patient underwent hyperbaric oxygen therapy for this lesion.  Previously I stressed the importance of continuing routine follow-up with ENT/RadOnc for direct visualization of his malignancy site.  We will continue to monitor and help with symptom management during this posttreatment surveillance period.   On exam today [name] [name] has what appears to be an abscess on the left side of his neck.  It is draining and has not responded to 3 cycles of antibiotics as given by other providers.  I would recommend that he see ENT for consideration of I&D of this lesion.  Additionally he continues to have jaw pain and sore throat which has been difficult to help resolve.  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   #Neck Abscess, new -- We will size lesion in the left neck which is tender to palpation and currently draining purulent material. --Patient does not have any systemic signs or symptoms. --Patient has completed 3 rounds of  antibiotics including Bactrim, clindamycin, and Keflex. --Encourage follow-up with ENT for consideration of I&D of this lesion.  #Recurrent LLE DVT --patient held warfarin while on bactrim. Encourage him to reconnect with coumadin clinic to restart treatment.  - Continue follow-up with anticoagulation clinic  - Duration of anticoagulation is lifelong   #Persistent oral candidiasis -Despite aggressive antifungal therapy, including amphotericin, there was no significant improvement in oral candidiasis -Infectious disease felt that the oral candidiasis was noninfectious, and therefore does not warrant any further treatment at this time -I counseled the patient on the importance of maintaining good oral hygiene            No orders of the defined types were placed in this encounter.   All questions were answered. The patient knows to call the clinic with any problems, questions or concerns.  A total of more than 30 minutes were spent on this encounter and over half of that time was spent on counseling and coordination of care as outlined above.   Ledell Peoples, MD Department of Hematology/Oncology [facility] Phone: [phone] Pager: [phone] Email: [email or web address]  08/05/2020 2:29 PM

## 2020-08-05 ENCOUNTER — Telehealth: Payer: Self-pay | Admitting: Hematology and Oncology

## 2020-08-05 LAB — TSH: TSH: 7.106 u[IU]/mL — ABNORMAL HIGH (ref 0.320–4.118)

## 2020-08-05 MED ORDER — CYCLOBENZAPRINE HCL 10 MG PO TABS: ORAL_TABLET | ORAL | 0 refills | Status: DC

## 2020-08-05 NOTE — Telephone Encounter (Signed)
Per sch msg a f/u appt has been scheduled for [name] [name] to see Dr. Lorenso Courier on 6/27 at 3pm w/labs at 230pm. Pt requested a late June appt instead of July. Letter mailed.

## 2020-08-07 ENCOUNTER — Telehealth: Payer: Self-pay | Admitting: *Deleted

## 2020-08-07 ENCOUNTER — Ambulatory Visit: Payer: Medicaid Other | Admitting: Physical Therapy

## 2020-08-07 NOTE — Telephone Encounter (Signed)
TCT patient regarding an appt that has been made for him with the ENT clinic @ [facility] on [address]  The appt is on 08/14/20 @ 2:30 pm Above information left on identified  vm   Advised to call ENT clinic @  [phone] with any questions or concerns.

## 2020-08-08 ENCOUNTER — Telehealth: Payer: Self-pay | Admitting: Adult Health Nurse Practitioner

## 2020-08-08 DIAGNOSIS — Z85819 Personal history of malignant neoplasm of unspecified site of lip, oral cavity, and pharynx: Secondary | ICD-10-CM | POA: Diagnosis not present

## 2020-08-08 DIAGNOSIS — L03211 Cellulitis of face: Secondary | ICD-10-CM | POA: Diagnosis not present

## 2020-08-08 NOTE — Telephone Encounter (Signed)
Called to change tomorrow's visit to telehealth due to upcoming winter weather.  Left VM with reason for call and call back info Aniel Hubble K. Olena Heckle NP

## 2020-08-09 ENCOUNTER — Other Ambulatory Visit: Payer: Self-pay

## 2020-08-09 ENCOUNTER — Other Ambulatory Visit: Payer: Medicaid Other | Admitting: Adult Health Nurse Practitioner

## 2020-08-09 DIAGNOSIS — C090 Malignant neoplasm of tonsillar fossa: Secondary | ICD-10-CM

## 2020-08-09 DIAGNOSIS — Z515 Encounter for palliative care: Secondary | ICD-10-CM

## 2020-08-09 DIAGNOSIS — M272 Inflammatory conditions of jaws: Secondary | ICD-10-CM | POA: Diagnosis not present

## 2020-08-09 NOTE — Progress Notes (Signed)
[facility] Consult Note Telephone: [phone]  Fax: [phone]  PATIENT NAME: [name] [name] DOB: Jan 15, 1961 MRN: [id number]  PRIMARY CARE PROVIDER:   [name], MD  REFERRING PROVIDER:  [name], MD [address] [place],  [place] [address]  RESPONSIBLE PARTY:   self [phone] Thane Edu: [phone] C: [phone]  Chief complaint: follow up palliative visit/abscess  Due to the COVID-19 crisis, this visit was done via telemedicine and it was initiated and consent by this patient and or family. Video-audio (telehealth) contact was unable to be done due to technical barriers from the patient's side.    RECOMMENDATIONS and PLAN:  1.  Advanced care planning.  Patient is full code  2.  Abscess of left jaw.  Continue with recommendations and follow up with oncology ENT  3. Tonsillar cancer.  Patient's oncology rehab on hold until abscess resolved.  Continue working with oncology ENT as above.  Continue current pain regimen for jaw pain after cancer treatment which is also helping with the abscess pain.    Palliative will continue to monitor for symptom management/decline and make recommendations as needed.  Will call in 4 weeks for follow up visit as patient has upcoming appointments that have not been scheduled yet. Encouraged to call with any questions or concerns.  I spent 40 minutes providing this consultation. More than 50% of the time in this consultation was spent coordinating communication.   HISTORY OF PRESENT ILLNESS:  [name] [name] is a 60 y.o. year old male with multiple medical problems including tonsillar cancer,HTN, HLD, h/o DVT and PE on coumadin. Palliative Care was asked to help address goals of care. Patient continues to have drainage and pain to left jaw abscess.  Had appointment with ENT yesterday and wants to do CT scan to determine what is going on before trying anything else at  this time.  Wants CT and treatment to be done with oncology ENT in [facility] where they are better equipped for this.  Does have appointment with oncology ENT at [facility] next Wednesday.  He is getting pain relief with current regimen of oxycodone 5mg  Q 8 hrs PRN and gabapentin 100mg  TID PRN.  Patient mainly takes these at bedtime.  Denies fever.  Appetite good with weight in the 220s.  Rest of 10 point ROS asked and negative. Of note oncology rehab has temporarily stopped working with his jaw movement until the drainage stops.  CODE STATUS: full code  PPS: 50% HOSPICE ELIGIBILITY/DIAGNOSIS: TBD  PHYSICAL EXAM:   Deferred   PAST MEDICAL HISTORY:  Past Medical History:  Diagnosis Date  . 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 Benign essential HTN 01/04/2012  . Cancer ([place])    tonsilar  . Chronic anticoagulation 01/02/2013  . Dermatofibroma of forearm 01/02/2013   Left side  . Hyperlipidemia, mixed 01/04/2012  . 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 Primary hypercoagulable state ([place]) 10/01/2014  . Sinus bradycardia, chronic 01/04/2012  . Superficial thrombosis of lower extremity 05/02/2012    SOCIAL HX:  Social History   Tobacco Use  . Smoking status: Former Smoker    Packs/day: 0.50    Years: 30.00    Pack years: 15.00    Types: Cigarettes    Quit date: 10/19/2018  Years since quitting: 1.8  . Smokeless tobacco: Never Used  Substance Use Topics  . Alcohol use: Yes    Alcohol/week: 13.0 - 14.0 standard drinks    Types: 12 Cans of beer, 1 - 2 Shots of liquor per week    Comment: 1-2 times per week.    ALLERGIES: No Known Allergies   PERTINENT MEDICATIONS:  Outpatient Encounter Medications as of 08/09/2020  Medication Sig  . atorvastatin (LIPITOR) 20 MG tablet Take 1 tablet (20 mg total) by mouth daily.  .  cyclobenzaprine (FLEXERIL) 10 MG tablet TAKE 1 TABLET BY MOUTH THREE TIMES A DAY AS NEEDED FOR MUSCLE SPASMS  . gabapentin (NEURONTIN) 100 MG capsule Take 200 mg by mouth every 8 (eight) hours as needed.  Marland Kitchen glycopyrrolate (ROBINUL) 1 MG tablet Take 1-2 tablet by mouth every eight hours as needed take 1-2 tabs every eight hours as needed for secretions (Patient not taking: Reported on 04/15/2020)  . ibuprofen (ADVIL) 200 MG tablet Take 600 mg by mouth every 6 (six) hours as needed for pain or fever.   . metoprolol succinate (TOPROL-XL) 25 MG 24 hr tablet TAKE 1 TABLET BY MOUTH EVERY DAY  . oxyCODONE (OXY IR/ROXICODONE) 5 MG immediate release tablet Take 5 mg by mouth every 8 (eight) hours as needed.  . pentoxifylline (TRENTAL) 400 MG CR tablet Take 400mg  tab daily x 1 week, then 400mg  BID; take with food. Upset stomach is rare on this med, but stop if you have upset stomach.  . sodium fluoride (PREVIDENT 5000 PLUS) 1.1 % CREA dental cream Apply to tooth brush. Brush teeth for 2 minutes. Spit out excess. DO NOT rinse afterwards. Repeat nightly.  . vitamin E 180 MG (400 UNITS) capsule Take 400 IU daily x 1 week, then 400 IU BID (Patient not taking: Reported on 08/02/2020)  . warfarin (COUMADIN) 10 MG tablet Take 1 tablet of 10 mg warfarin daily except on Sundays, Thursdays and Saturdays, take only 1/2 tablet (5 mg).   No facility-administered encounter medications on file as of 08/09/2020.     Nnamdi Dacus Jenetta Downer, NP

## 2020-08-19 ENCOUNTER — Other Ambulatory Visit: Payer: Self-pay | Admitting: Otolaryngology

## 2020-08-19 DIAGNOSIS — L0211 Cutaneous abscess of neck: Secondary | ICD-10-CM

## 2020-08-20 DIAGNOSIS — C024 Malignant neoplasm of lingual tonsil: Secondary | ICD-10-CM | POA: Diagnosis not present

## 2020-08-27 ENCOUNTER — Inpatient Hospital Stay: Admission: RE | Admit: 2020-08-27 | Payer: Medicaid Other | Source: Ambulatory Visit

## 2020-08-29 NOTE — Progress Notes (Signed)
JACSON, RAPAPORT ([id number]) Visit Report for 03/01/2020 SuperBill Details Patient Name: Date of Service: VAN, SEYMORE 03/01/2020 Medical Record Number: [id number] Patient Account Number: [id number] Date of Birth/Sex: Treating RN: 1961-01-13 (60 y.o. 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, unspecified Facility Procedures CPT4 Code Description Modifier Quantity [id number]-Physician attendance and supervision of hyperbaric oxygen therapy, per session 1 ICD-10 Diagnosis Description L59.8 Other specified disorders of the skin and subcutaneous tissue related to radiation Physician Procedures Quantity CPT4 Code Description Modifier [id number] - WC PHYS HYPERBARIC OXYGEN THERAPY 1 ICD-10 Diagnosis Description L59.8 Other specified disorders of the skin and subcutaneous tissue related to radiation Electronic Signature(s) Signed: 03/01/2020 5:11:59 PM By: [name] EMT/HBOT/SD Signed: 08/29/2020 4:20:40 PM By: Tobi Bastos MD, MBA Entered By: [name] on 03/01/2020 17:11:11

## 2020-09-02 ENCOUNTER — Telehealth: Payer: Self-pay | Admitting: Adult Health Nurse Practitioner

## 2020-09-02 ENCOUNTER — Other Ambulatory Visit: Payer: Medicaid Other

## 2020-09-02 NOTE — Telephone Encounter (Signed)
Spoke with patient.  Needing refill on oxy.  Checked PDMP.  Sent refill for oxy 5mg  Q 8 hrs PRN for pain to [facility] in [place]. He is currently waiting for Medicaid to approve CT scan of jaw prior to any further treatment.  Will call in 4 weeks as to set up next appt. Jazariah Teall K. Olena Heckle NP

## 2020-09-09 ENCOUNTER — Other Ambulatory Visit: Payer: Self-pay

## 2020-09-09 ENCOUNTER — Ambulatory Visit
Admission: RE | Admit: 2020-09-09 | Discharge: 2020-09-09 | Disposition: A | Discharge status: Home / Self Care | Payer: Medicaid Other | Source: Ambulatory Visit | Attending: Otolaryngology | Admitting: Otolaryngology

## 2020-09-09 DIAGNOSIS — Z85819 Personal history of malignant neoplasm of unspecified site of lip, oral cavity, and pharynx: Secondary | ICD-10-CM | POA: Diagnosis not present

## 2020-09-09 DIAGNOSIS — L0211 Cutaneous abscess of neck: Secondary | ICD-10-CM

## 2020-09-09 DIAGNOSIS — I6522 Occlusion and stenosis of left carotid artery: Secondary | ICD-10-CM | POA: Diagnosis not present

## 2020-09-09 DIAGNOSIS — E041 Nontoxic single thyroid nodule: Secondary | ICD-10-CM | POA: Diagnosis not present

## 2020-09-09 DIAGNOSIS — J341 Cyst and mucocele of nose and nasal sinus: Secondary | ICD-10-CM | POA: Diagnosis not present

## 2020-09-09 MED ORDER — IOPAMIDOL (ISOVUE-300) INJECTION 61%
75.0000 mL | Freq: Once | INTRAVENOUS | Status: AC | PRN
  Administered 2020-09-09: 75 mL via INTRAVENOUS

## 2020-09-13 ENCOUNTER — Other Ambulatory Visit: Payer: Medicaid Other

## 2020-09-16 ENCOUNTER — Telehealth: Payer: Self-pay | Admitting: *Deleted

## 2020-09-16 NOTE — Telephone Encounter (Signed)
Received call from  Pt's girlfriend, [name] [name]. She is calling about the results of his recent neck CT scan.  She states it showed a fracture and she wants to know what [name] [name] is to do about this. Discussed the scan results with Dr. Lorenso Courier. He recommends that pt keep the appt with ENT in [facility] and follow their advice on treatment options.  Pt and [name] [name] voiced understanding. He has an appt on 09/24/20

## 2020-09-17 DIAGNOSIS — C024 Malignant neoplasm of lingual tonsil: Secondary | ICD-10-CM | POA: Diagnosis not present

## 2020-09-17 NOTE — Progress Notes (Signed)
Oncology Nurse Navigator Documentation  I received a voice mail from Mr. [name] [name] girlfriend [name] [name]. She is concerned about the results of Mr. [name] [name] recent CT scan showing a non-displaced fracture in his mandibular area. I explained to her what a non-displaced fracture meant and Dr. Libby Maw recommendation from yesterday to keep his 09/24/20 appointment with the ENT in [facility] was the best option for evaluation. She voiced her appreciation for the return call and knows to call me if she has any further questions.   Harlow Asa RN, BSN, OCN Head & Neck Oncology Nurse [place] [facility] Phone # [phone]  Fax # [phone]

## 2020-09-25 ENCOUNTER — Telehealth: Payer: Self-pay | Admitting: Adult Health Nurse Practitioner

## 2020-09-25 NOTE — Telephone Encounter (Signed)
Called patient to schedule follow up.  Left VM with reason for call and call back info Bear Osten K. Olena Heckle NP

## 2020-09-26 ENCOUNTER — Telehealth: Payer: Self-pay | Admitting: Adult Health Nurse Practitioner

## 2020-09-26 NOTE — Telephone Encounter (Signed)
Spoke with patient.  He has had CT of his jaw and has fracture and osteomyelitis.  He is concerned that his ID appointment is still 2 weeks ago.  States that he is having increased pain and is inquiring about more oral antibiotics.  Discussed that he really needs to be seen by ID as he has already gone through 3 rounds of oral antibiotics without complete resolution of the infection.  Will reach out to [name] to see if appointment can be made sooner.  Patient did say that he would try calling as well.  Will call back in one week to follow up. Hula Tasso K. Olena Heckle NP

## 2020-09-27 ENCOUNTER — Telehealth: Payer: Self-pay | Admitting: Adult Health Nurse Practitioner

## 2020-09-27 NOTE — Telephone Encounter (Signed)
Had been corresponding via epic email with [name] to get sooner appt for osteomyelitis of left jaw.  His office had left messages with patient.  Left VM with his SO to return Dr. Henreitta Leber office call to arrange earlier appointment. Kayson Bullis K. Olena Heckle NP

## 2020-09-30 ENCOUNTER — Other Ambulatory Visit (HOSPITAL_COMMUNITY): Payer: Self-pay | Admitting: Internal Medicine

## 2020-09-30 ENCOUNTER — Ambulatory Visit (INDEPENDENT_AMBULATORY_CARE_PROVIDER_SITE_OTHER): Payer: Medicaid Other | Admitting: Internal Medicine

## 2020-09-30 ENCOUNTER — Other Ambulatory Visit: Payer: Self-pay

## 2020-09-30 ENCOUNTER — Encounter: Payer: Self-pay | Admitting: Internal Medicine

## 2020-09-30 ENCOUNTER — Telehealth: Payer: Self-pay

## 2020-09-30 DIAGNOSIS — M272 Inflammatory conditions of jaws: Secondary | ICD-10-CM

## 2020-09-30 MED ORDER — METRONIDAZOLE 500 MG PO TABS: 500.0000 mg | ORAL_TABLET | Freq: Three times a day (TID) | ORAL | 1 refills | Status: DC

## 2020-09-30 MED ORDER — CEFTRIAXONE SODIUM-DEXTROSE 2-2.22 GM-%(50ML) IV SOLR: 2.0000 g | INTRAVENOUS | Status: DC

## 2020-09-30 NOTE — Progress Notes (Signed)
[place] for Infectious Disease      Reason for Consult: osteomyelitis of jaw    Referring Physician: Josefina Do, NP    Patient ID: [name] [name], male    DOB: 1961-01-16, 60 y.o.   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  I saw him previously for possible thrush that was resistant to treatment after a trial of amphotericin.  This did not resolve likely secondary to it being a non-infectious etiology.   He is here today for a new issue regarding his mandible.  He has undergone radiation therapy for throat cancer and is currently in hyperbaric therapy for his wounds.  He recently had a CT scan at [facility] on 09/10/20 and is concerning for osteonecrosis vs osteomyelitis.  Dr. Claud Kelp is concerned with infection and would like for me to consider a picc line and IV therapy for presumed osteomyelitis prior to considering any surgical intervention.   Here with his girlfriend.  He has an area over his left jaw that is open with thick, brown discharge.  He states it opened about 2-3 months ago.    Past Medical History:  Diagnosis Date  . 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 Benign essential HTN 01/04/2012  . Cancer ([place])    tonsilar  . Chronic anticoagulation 01/02/2013  . Dermatofibroma of forearm 01/02/2013   Left side  . Hyperlipidemia, mixed 01/04/2012  . 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 Primary hypercoagulable state ([place]) 10/01/2014  . Sinus bradycardia, chronic 01/04/2012  . Superficial thrombosis of lower extremity 05/02/2012    Prior to Admission medications   Medication Sig Start Date End Date Taking? Authorizing Provider  atorvastatin (LIPITOR) 20 MG tablet Take 1 tablet (20 mg total) by mouth daily. 02/07/19 02/07/20  [name], MD   cyclobenzaprine (FLEXERIL) 10 MG tablet TAKE 1 TABLET BY MOUTH THREE TIMES A DAY AS NEEDED FOR MUSCLE SPASMS 08/05/20   Orson Slick, MD  gabapentin (NEURONTIN) 100 MG capsule Take 200 mg by mouth every 8 (eight) hours as needed. 11/10/19   [provider]  glycopyrrolate (ROBINUL) 1 MG tablet Take 1-2 tablet by mouth every eight hours as needed take 1-2 tabs every eight hours as needed for secretions Patient not taking: Reported on 04/15/2020 09/28/19   [provider]  ibuprofen (ADVIL) 200 MG tablet Take 600 mg by mouth every 6 (six) hours as needed for pain or fever.     [provider]  metoprolol succinate (TOPROL-XL) 25 MG 24 hr tablet TAKE 1 TABLET BY MOUTH EVERY DAY 01/08/20   Darrick Meigs, Rylee, MD  oxyCODONE (OXY IR/ROXICODONE) 5 MG immediate release tablet Take 5 mg by mouth every 8 (eight) hours as needed. 10/17/19   [provider]  pentoxifylline (TRENTAL) 400 MG CR tablet Take 400mg  tab daily x 1 week, then 400mg  BID; take with food. Upset stomach is rare on this med, but stop if you have upset stomach. 09/08/19   Eppie Gibson, MD  sodium fluoride (PREVIDENT 5000 PLUS) 1.1 % CREA dental cream Apply to tooth brush. Brush teeth for 2 minutes. Spit out excess. DO NOT rinse afterwards. Repeat nightly. 10/24/18   Lenn Cal, DDS  vitamin E 180 MG (400 UNITS) capsule Take 400 IU daily x  1 week, then 400 IU BID Patient not taking: Reported on 08/02/2020 09/08/19   Eppie Gibson, MD  warfarin (COUMADIN) 10 MG tablet Take 1 tablet of 10 mg warfarin daily except on Sundays, Thursdays and Saturdays, take only 1/2 tablet (5 mg). 10/09/19   Pennie Banter, RPH-CPP    No Known Allergies  Social History   Tobacco Use  . Smoking status: Former Smoker    Packs/day: 0.50    Years: 30.00    Pack years: 15.00    Types: Cigarettes    Quit date: 10/19/2018    Years since quitting: 1.9  . Smokeless tobacco: Never Used  Vaping Use  . Vaping Use: Former  .  Substances: Nicotine, Flavoring  . Devices: 1 cartridge q2-3 days, he tells me he is not using.   Substance Use Topics  . Alcohol use: Yes    Alcohol/week: 13.0 - 14.0 standard drinks    Types: 12 Cans of beer, 1 - 2 Shots of liquor per week    Comment: 1-2 times per week.  . Drug use: No    Family History  Problem Relation Age of Onset  . Stroke Father     Review of Systems  Constitutional: negative for fevers and chills Gastrointestinal: negative for nausea and diarrhea Integument/breast: negative for rash All other systems reviewed and are negative    Constitutional: in no apparent distress There were no vitals filed for this visit. EYES: anicteric ENMT: Jaw on left with ulcerated area with brown discharge on posterior area of jaw Cardiovascular: Cor RRR Respiratory: clear GI: Bowel sounds are normal, liver is not enlarged, spleen is not enlarged Musculoskeletal: no pedal edema noted Skin: negatives: no rash  Labs: Lab Results  Component Value Date   WBC 6.1 08/02/2020   HGB 15.6 08/02/2020   HCT 46.5 08/02/2020   MCV 97.5 08/02/2020   PLT 182 08/02/2020    Lab Results  Component Value Date   CREATININE 0.92 08/02/2020   BUN 11 08/02/2020   NA 141 08/02/2020   K 4.6 08/02/2020   CL 104 08/02/2020   CO2 29 08/02/2020    Lab Results  Component Value Date   ALT 18 08/02/2020   AST 21 08/02/2020   ALKPHOS 100 08/02/2020   BILITOT 0.7 08/02/2020   INR 2.7 10/23/2019     Assessment: 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  I discussed with him the treatment options including observation, oral treatment vs IV and that IV therapy will give him the best opportunity to heal if this is due to infection.  I discussed that with the opening and discharge, infection is certainly likely and I am hopeful he will have some benefit with treatment.  He is able to take pills.    Plan: 1) picc line 2) home health services 3) IV ceftriaxone 2 grams daily for 6 weeks 4) oral  metronidazole 500 mg three times a day for 6 weeks 5) short stay for first dose  rtc in 3 weeks to check on progress

## 2020-09-30 NOTE — Telephone Encounter (Signed)
Received call from [facility] at [place], patient's medication will require a PA. Stanton Kidney will fax over information as it becomes available.   Beryle Flock, RN

## 2020-09-30 NOTE — Telephone Encounter (Signed)
Per MD called IR to schedule appointment for picc line placement. Spoke with Caryl Pina who was able to schedule appointment for Friday at 2pm. Will need first dose of antibiotics at Short Stay.  Patient is okay with picc appointment. Understands he will need to arrive 15 mins early to check in.  Does not have any questions at this time. Will fax orders to Short Stay once PA information is received from [facility].  Orders faxed to [facility] along with demographics, insurance information, office note. Confirmation received.

## 2020-10-02 NOTE — Telephone Encounter (Signed)
PA approved for patient's medication per [facility]. Patient will have a daily copay of $0.43.  Faxed orders to short stay.

## 2020-10-03 ENCOUNTER — Other Ambulatory Visit (HOSPITAL_COMMUNITY): Payer: Self-pay

## 2020-10-03 LAB — WOUND CULTURE
MICRO NUMBER:: 11645421
SPECIMEN QUALITY:: ADEQUATE

## 2020-10-04 ENCOUNTER — Ambulatory Visit: Payer: Medicaid Other | Admitting: Radiation Oncology

## 2020-10-04 ENCOUNTER — Other Ambulatory Visit: Payer: Self-pay

## 2020-10-04 ENCOUNTER — Ambulatory Visit (HOSPITAL_COMMUNITY)
Admission: RE | Admit: 2020-10-04 | Discharge: 2020-10-04 | Disposition: A | Discharge status: Home / Self Care | Payer: Medicaid Other | Source: Ambulatory Visit | Attending: Internal Medicine | Admitting: Internal Medicine

## 2020-10-04 ENCOUNTER — Encounter (HOSPITAL_COMMUNITY)
Admission: RE | Admit: 2020-10-04 | Discharge: 2020-10-04 | Disposition: A | Discharge status: Home / Self Care | Payer: Medicaid Other | Source: Ambulatory Visit | Attending: Internal Medicine | Admitting: Internal Medicine

## 2020-10-04 DIAGNOSIS — M272 Inflammatory conditions of jaws: Secondary | ICD-10-CM | POA: Insufficient documentation

## 2020-10-04 DIAGNOSIS — Z452 Encounter for adjustment and management of vascular access device: Secondary | ICD-10-CM | POA: Diagnosis not present

## 2020-10-04 MED ORDER — HEPARIN SOD (PORK) LOCK FLUSH 100 UNIT/ML IV SOLN
INTRAVENOUS | Status: AC
  Administered 2020-10-04: 250 [IU]
  Filled 2020-10-04: qty 5

## 2020-10-04 MED ORDER — HEPARIN SOD (PORK) LOCK FLUSH 100 UNIT/ML IV SOLN: 250.0000 [IU] | Freq: Every day | INTRAVENOUS | Status: DC

## 2020-10-04 MED ORDER — IOHEXOL 300 MG/ML  SOLN: 50.0000 mL | Freq: Once | INTRAMUSCULAR | Status: DC | PRN

## 2020-10-04 MED ORDER — SODIUM CHLORIDE 0.9 % IV SOLN
2.0000 g | Freq: Once | INTRAVENOUS | Status: AC
  Administered 2020-10-04: 2 g via INTRAVENOUS
  Filled 2020-10-04: qty 20

## 2020-10-04 MED ORDER — HEPARIN SOD (PORK) LOCK FLUSH 100 UNIT/ML IV SOLN: 250.0000 [IU] | INTRAVENOUS | Status: DC | PRN

## 2020-10-04 MED ORDER — LIDOCAINE HCL 1 % IJ SOLN
INTRAMUSCULAR | Status: AC
  Filled 2020-10-04: qty 20

## 2020-10-04 MED ORDER — LIDOCAINE HCL 1 % IJ SOLN
INTRAMUSCULAR | Status: DC | PRN
  Administered 2020-10-04: 5 mL

## 2020-10-04 NOTE — Procedures (Signed)
Successful placement of single lumen midline to brachial vein. Length 16cm Midline capped No complications Ready for use.  EBL < 5 mL   Docia Barrier PA-C 10/04/2020 3:26 PM

## 2020-10-05 DIAGNOSIS — M272 Inflammatory conditions of jaws: Secondary | ICD-10-CM | POA: Diagnosis not present

## 2020-10-05 DIAGNOSIS — M869 Osteomyelitis, unspecified: Secondary | ICD-10-CM | POA: Diagnosis not present

## 2020-10-07 ENCOUNTER — Other Ambulatory Visit: Payer: Self-pay

## 2020-10-07 ENCOUNTER — Telehealth: Payer: Self-pay

## 2020-10-07 DIAGNOSIS — M272 Inflammatory conditions of jaws: Secondary | ICD-10-CM | POA: Diagnosis not present

## 2020-10-07 DIAGNOSIS — Z1329 Encounter for screening for other suspected endocrine disorder: Secondary | ICD-10-CM

## 2020-10-07 DIAGNOSIS — M869 Osteomyelitis, unspecified: Secondary | ICD-10-CM | POA: Diagnosis not present

## 2020-10-07 NOTE — Telephone Encounter (Signed)
Confirmed with staff at [facility] that they would be able to draw TSH and free T4 during patients lab visit this afternoon. Asked that results be faxed to Dr. Lorenso Courier and Dr. Isidore Moos once resulted. No other needs identified at this time

## 2020-10-08 ENCOUNTER — Ambulatory Visit
Admission: RE | Admit: 2020-10-08 | Discharge: 2020-10-08 | Disposition: A | Discharge status: Home / Self Care | Payer: Medicaid Other | Source: Ambulatory Visit | Attending: Radiation Oncology | Admitting: Radiation Oncology

## 2020-10-08 ENCOUNTER — Ambulatory Visit: Payer: Medicaid Other

## 2020-10-08 ENCOUNTER — Other Ambulatory Visit: Payer: Self-pay

## 2020-10-08 VITALS — BP 117/85 | HR 67 | Temp 98.6°F | Resp 20 | Ht 75.0 in | Wt 232.8 lb

## 2020-10-08 DIAGNOSIS — C090 Malignant neoplasm of tonsillar fossa: Secondary | ICD-10-CM | POA: Insufficient documentation

## 2020-10-08 DIAGNOSIS — R5382 Chronic fatigue, unspecified: Secondary | ICD-10-CM | POA: Diagnosis not present

## 2020-10-08 DIAGNOSIS — Z923 Personal history of irradiation: Secondary | ICD-10-CM | POA: Insufficient documentation

## 2020-10-08 DIAGNOSIS — M272 Inflammatory conditions of jaws: Secondary | ICD-10-CM | POA: Insufficient documentation

## 2020-10-08 DIAGNOSIS — F17210 Nicotine dependence, cigarettes, uncomplicated: Secondary | ICD-10-CM | POA: Diagnosis not present

## 2020-10-08 NOTE — Progress Notes (Signed)
Radiation Oncology         [phone] ________________________________  Name: [name] [name] MRN: [id number]  Date: 10/08/2020  DOB: May 20, 1961  Follow-up outpatient, in person visit  CC: [name], MD  [name], MD  Diagnosis and Prior Radiotherapy:       ICD-10-CM   1. Malignant neoplasm of tonsillar fossa ([place])  C09.0     11/08/2018 - 12/28/2018: Left Tonsil / 70 Gy in 35 fractions Cancer Staging Malignant neoplasm of tonsillar fossa ([place]) Staging form: Pharynx - HPV-Mediated Oropharynx, AJCC 8th Edition - Clinical: Stage I (cT2, cN1, cM0, p16+) - Signed by Eppie Gibson, MD on 10/21/2018 Laterality: Left  CHIEF COMPLAINT:  Here for follow-up and surveillance of tonsillar cancer  Narrative:    [name] [name] presents for followup of radiation completed 12/28/18 to his tonsil  CT scan ordered by Dr. 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  He since was evaluated by otolaryngology at [facility] to discuss surgery to his jaw, and his started IV antibiotics with infectious disease.  No evidence of neoplastic disease in the neck or throat according to imaging or recent examination by otolaryngology.  Pain issues, if any: Reports constant mild-moderate pain to the left side of his jaw. Reports OTC Tylenol helps during the day, and then he uses Oxycodone at night to help him sleep through the night Using a feeding tube?: N/A Weight changes, if any:  Wt Readings from Last 3 Encounters:  10/08/20 232 lb 12.8 oz (105.6 kg)  09/30/20 230 lb 9.6 oz (104.6 kg)  08/02/20 227 lb 8 oz (103.2 kg)   Swallowing issues, if any: Yes--patient still only able to tolerate a liquid diet because he has such limited ROM to his jaw Smoking or chewing tobacco? Reports occasional cigatette use-approximately half a pack a week Using fluoride trays daily? No--unable to use consistently because of range  of motion restriction in jaw (Patient doesn't see a dentist and girlfriend reports patient is starting to lose teeth)  Last ENT visit was on: 09/24/2020 Saw Dr. Francina Ames and Dr. Lennox Laity (resident):  --Transnasal fiberoptic laryngoscopy Examination was carried out of the nose, nasopharynx, oropharynx, hypopharynx, and larynx. When I had previously examined him, his glossotonsillar sulcus/vallecula/tongue base on the left appeared grossly necrotic. This has improved dramatically after hyperbaric oxygen with mucosalized tissue present, no sign of necrotic debris. [name] continues to have retained secretions in the vallecula, piriforms and coating the arytenoids intermittently with a poor job of clearing these secretions even with repeated swallowing  --Unfortunately [name] [name] continues to smoke, in spite of his poor wound healing and requirement for hyperbaric oxygen therapy.  --We discussed the importance of smoking cessation for his wound healing.  --I have referred him back to infectious disease, he previously saw [name] at [facility]. It may be worth considering getting a PICC line and cooling things down prior to any surgical management.  --Would consider mandible debridement with pectoralis flap in the future. His girlfriend, he, and I discussed that we would likely not use any bone to re-establish continuity of the mandible but to provide tissue coverage for the remaining segments of mandible after it would be debrided using pectoralis muscle. To complete this, he may require a tracheostomy vs. Awake nasal intubation. It may be impossible to intubate him orally with the degree of his extreme trismus and sharp fragments of teeth which are remaining  along his anterior mandible.  --Return to clinic 6 weeks for discussion of possible surgical planning should he have halted smoking   Other notable issues, if any:  Denies any symptoms of lymphedema to neck or jaw. Continues to deal  with dry mouth and thick saliva. Reports occasional pain to left ear, as well as constant left sided jaw pain (unable to open mouth fully). Girlfriend reports a depressed appetite, but patient denies any nausea. Denies any diarrhea or constipation. 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  I discussed with him the treatment options including observation, oral treatment vs IV and that IV therapy will give him the best opportunity to heal if this is due to infection.  I discussed that with the opening and discharge, infection is certainly likely and I am hopeful he will have some benefit with treatment.  He is able to take pills.   --Plan: 1) picc line 2) home health services 3) IV ceftriaxone 2 grams daily for 6 weeks 4) oral metronidazole 500 mg three times a day for 6 weeks 5) short stay for first dose --rtc in 3 weeks to check on progress  Last saw medical oncologist ([name] [name]) on 08/02/2020  He reports chronic fatigue.  TSH obtained on 10/07/2020 by home health was high at 12.8.  Free T4 was normal at 0.97  Vitals:   10/08/20 1403  BP: 117/85  Pulse: 67  Resp: 20  Temp: 98.6 F (37 C)  SpO2: 99%     Today's Vitals   10/08/20 1403  BP: 117/85  Pulse: 67  Resp: 20  Temp: 98.6 F (37 C)  SpO2: 99%  Weight: 232 lb 12.8 oz (105.6 kg)  Height: 6\' 3"  (1.905 m)   Body mass index is 29.1 kg/m.  ALLERGIES:  has No Known Allergies.  Meds: Current Outpatient Medications  Medication Sig Dispense Refill  . atorvastatin (LIPITOR) 20 MG tablet Take 1 tablet (20 mg total) by mouth daily. 30 tablet 11  . cefTRIAXone (ROCEPHIN) 2-2.22 GM-%(50ML) IVPB Inject 50 mLs (2 g total) into the vein daily. 1 each   . cyclobenzaprine (FLEXERIL) 10 MG tablet TAKE 1 TABLET BY MOUTH THREE TIMES A DAY AS NEEDED FOR MUSCLE SPASMS 30 tablet 0  .  gabapentin (NEURONTIN) 100 MG capsule Take 200 mg by mouth every 8 (eight) hours as needed.    Marland Kitchen glycopyrrolate (ROBINUL) 1 MG tablet Take 1-2 tablet by mouth every eight hours as needed take 1-2 tabs every eight hours as needed for secretions (Patient not taking: No sig reported)    . ibuprofen (ADVIL) 200 MG tablet Take 600 mg by mouth every 6 (six) hours as needed for pain or fever.     . metoprolol succinate (TOPROL-XL) 25 MG 24 hr tablet TAKE 1 TABLET BY MOUTH EVERY DAY 90 tablet 1  . metroNIDAZOLE (FLAGYL) 500 MG tablet Take 1 tablet (500 mg total) by mouth 3 (three) times daily. 42 days total 120 tablet 1  . oxyCODONE (OXY IR/ROXICODONE) 5 MG immediate release tablet Take 5 mg by mouth every 8 (eight) hours as needed.    . pentoxifylline (TRENTAL) 400 MG CR tablet Take 400mg  tab daily x 1 week, then 400mg  BID; take with food. Upset stomach is rare on this med, but stop if you have upset stomach. 60 tablet 5  . sodium fluoride (PREVIDENT 5000 PLUS) 1.1 % CREA dental cream  Apply to tooth brush. Brush teeth for 2 minutes. Spit out excess. DO NOT rinse afterwards. Repeat nightly. (Patient not taking: Reported on 09/30/2020) 1 Tube prn  . vitamin E 180 MG (400 UNITS) capsule Take 400 IU daily x 1 week, then 400 IU BID 60 capsule 5  . warfarin (COUMADIN) 10 MG tablet Take 1 tablet of 10 mg warfarin daily except on Sundays, Thursdays and Saturdays, take only 1/2 tablet (5 mg). 72 tablet 2   No current facility-administered medications for this encounter.    Physical Findings: The patient is in no acute distress. Patient is alert and oriented. Wt Readings from Last 3 Encounters:  10/08/20 232 lb 12.8 oz (105.6 kg)  09/30/20 230 lb 9.6 oz (104.6 kg)  08/02/20 227 lb 8 oz (103.2 kg)    height is 6\' 3"  (1.905 m) and weight is 232 lb 12.8 oz (105.6 kg). His temperature is 98.6 F (37 C). His blood pressure is 117/85 and his pulse is 67. His respiration is 20 and oxygen saturation is 99%. .      General: Alert and oriented, in no acute distress HEENT: Head is normocephalic. Extraocular movements are intact. Oropharynx is difficult to see due to trismus.  White coating persists on tongue, many teeth are missing  Neck: No palpable cervical or supraclavicular lymphadenopathy. Significant fibrosis and mild edema in left cervical neck.  Bandage removed from left jaw where he has a 1 cm open area with tan drainage Lymphatics: see Neck Exam Skin: No concerning lesions.  Ext: PICC line, right arm      Lab Findings: Lab Results  Component Value Date   WBC 6.1 08/02/2020   HGB 15.6 08/02/2020   HCT 46.5 08/02/2020   MCV 97.5 08/02/2020   PLT 182 08/02/2020     TSH obtained on 10/07/2020 by home health was high at 12.8.  Free T4 was normal at 0.97  Radiographic Findings: CT SOFT TISSUE NECK W CONTRAST  Result Date: 09/09/2020 CLINICAL DATA:  Neck abscess. Left neck pain. History of left tonsil cancer status post surgery and radiation. EXAM: CT NECK WITH CONTRAST TECHNIQUE: Multidetector CT imaging of the neck was performed using the standard protocol following the bolus administration of intravenous contrast. CONTRAST:  59mL ISOVUE-300 IOPAMIDOL (ISOVUE-300) INJECTION 61% COMPARISON:  09/24/2019 FINDINGS: Pharynx and larynx: Sequelae of prior radiation therapy are again identified with interval decrease of diffuse pharyngeal soft tissue swelling since the prior CT. There is left-sided tongue atrophy. There is chronic ulceration of the left lateral oropharyngeal wall with assessment of this region limited by metallic dental streak artifact. Gas within this area of ulceration extended to the surface of the mandible on the prior CT, however there is increased non masslike soft tissue currently which may reflect a combination of granulation tissue and inflammation related to the bone process described below. No definite recurrent mucosal mass is identified. The airway is widely patent. There is no  retropharyngeal fluid. Salivary glands: Post radiation changes. Decreased size of a nodule anteriorly in the superficial left parotid gland which now measures 4 mm (previously 6 mm. Thyroid: Unchanged subcentimeter left thyroid nodule for which no imaging follow-up is recommended. Lymph nodes: No enlarged or suspicious lymph nodes in the neck. Vascular: Increased low-density wall thickening/soft plaque at the carotid bifurcation resulting in less than 50% proximal ICA stenosis. Limited intracranial: Unremarkable. Visualized orbits: Unremarkable. Mastoids and visualized paranasal sinuses: Left maxillary sinus mucous retention cyst. Clear mastoid air cells. Skeleton: The skin marker overlies  the posterior left mandibular body. There are progressive findings mixed permeative osteolysis and sclerosis involving the posterior mandibular body and angle on the left with a new underlying nondisplaced fracture. There is surrounding soft tissue thickening without a fluid collection identified. Upper chest: Centrilobular and paraseptal emphysema. Other: None. IMPRESSION: 1. Progressive bone changes in the posterior mandibular body and angle on the left with a nondisplaced fracture. 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 No evidence of an abscess. 2. No recurrent mucosal mass or cervical lymphadenopathy identified. 3. Aortic Atherosclerosis (ICD10-I70.0) and Emphysema (ICD10-J43.9). Electronically Signed   By: Logan Bores M.D.   On: 09/09/2020 14:57   [place] PLACEMENT LEFT >5 YRS INC IMG GUIDE  Result Date: 10/04/2020 INDICATION: Patient with history of osteomyelitis of the jaw. Request is made for peripherally inserted central venous catheter placement. EXAM: MIDLINE PLACEMENT WITH ULTRASOUND AND FLUOROSCOPIC GUIDANCE MEDICATIONS: None ANESTHESIA/SEDATION: None FLUOROSCOPY TIME:  Fluoroscopy Time: 2 minutes 6 seconds (24 mGy). COMPLICATIONS: None immediate. PROCEDURE: The patient was advised  of the possible risks and complications and agreed to undergo the procedure. The patient was then brought to the angiographic suite for the procedure. The right arm was prepped with chlorhexidine, draped in the usual sterile fashion using maximum barrier technique (cap and mask, sterile gown, sterile gloves, large sterile sheet, hand hygiene and cutaneous antisepsis) and infiltrated locally with 1% Lidocaine. Ultrasound demonstrated patency of the right basilic vein, and this was documented with an image. Under real-time ultrasound guidance, this vein was accessed with a 21 gauge micropuncture needle and image documentation was performed. A 0.018 wire was introduced in to the vein however the guidewire abruptly stopped and could not be advanced any further. The basilic vein was reaccessed but again the guidewire could not be advanced past the upper arm. The right brachial vein was identified, ultrasound demonstrated patency, in the vein was accessed with a 21 gauge micropuncture needle. A Nitrex wire was introduced and able to be advanced to the axillary vein, however could not be advanced any further. Contrast injection shows central venous occlusion not amenable to central venous catheter placement. Over guidewire, a 5 [place] single lumen power-injectable/regular midline catheter was left in place. Fluoroscopy during the procedure and fluoro spot radiograph confirms appropriate catheter position. The catheter was flushed and covered with a sterile dressing. Catheter length: 16 cm IMPRESSION: Successful right arm power midline catheter placement with ultrasound and fluoroscopic guidance. The catheter is ready for use. Read by: Brynda Greathouse PA-C Electronically Signed   By: Corrie Mckusick D.O.   On: 10/04/2020 15:45    Impression/Plan:   This is a wonderful 60 year-old gentleman with a history of oropharyngeal cancer status post chemoradiation.  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  He was started IV antibiotics.  He spoken with [facility] otolaryngology about potential surgery for this.  He wonders if he should get a second opinion.  I explained to him that there are significant benefits to maintaining his care with the team at [facility].  However, if he does want a second opinion, I recommended he talk to them about mandibular specialists in [place] that they would recommend.  He and his girlfriend will think about this.  They  do like the team at [facility] but also expressed a desire to undergo surgery with provider that can give him continuous care.  The excellent senior resident that has worked on his case anticipates moving a month after his potential surgery.  I recommended that they talk further with his team about the reservations as there may be a solution that is satisfactory to them.  Unfortunately, he does continue to smoke and knows that this is compromising his ability to heal and could compromise his ability to undergo surgery.  Recommended trying nicotine lozenges.  He reports smoking less than half a pack a day  The patient reports chronic fatigue.  His TSH has been running high but his free T4 is normal.  I offered to start him on a low-dose of levothyroxine.  He declines this today.  I recommended that he continue to follow with medical oncology for his thyroid function tests.  I will see him back in 1 year and he will continue to follow closely with otolaryngology and medical oncology for additional support.  I can certainly see him sooner if needed.  On date of service, in total, I spent 30 minutes on this encounter. Patient was seen in person.  _____________________________________   Eppie Gibson, MD

## 2020-10-08 NOTE — Progress Notes (Signed)
[name] [name] presents for followup of radiation completed 12/28/18 to his tonsil  Pain issues, if any: Reports constant mild-moderate pain to the left side of his jaw. Reports OTC Tylenol helps during the day, and then he uses Oxycodone at night to help him sleep through the night Using a feeding tube?: N/A Weight changes, if any:  Wt Readings from Last 3 Encounters:  10/08/20 232 lb 12.8 oz (105.6 kg)  09/30/20 230 lb 9.6 oz (104.6 kg)  08/02/20 227 lb 8 oz (103.2 kg)   Swallowing issues, if any: Yes--patient still only able to tolerate a liquid diet because he has such limited ROM to his jaw Smoking or chewing tobacco? Reports occasional cigatette use Using fluoride trays daily? No--unable to use consistently because of range of motion restriction in jaw (Patient doesn't see a dentist and girlfriend reports patient is starting to loose teeth) Last ENT visit was on: 09/24/2020 Saw Dr. Francina Ames and Dr. Lennox Laity (resident):  --Transnasal fiberoptic laryngoscopy Examination was carried out of the nose, nasopharynx, oropharynx, hypopharynx, and larynx. When I had previously examined him, his glossotonsillar sulcus/vallecula/tongue base on the left appeared grossly necrotic. This has improved dramatically after hyperbaric oxygen with mucosalized tissue present, no sign of necrotic debris. [name] [name] continues to have retained secretions in the vallecula, piriforms and coating the arytenoids intermittently with a poor job of clearing these secretions even with repeated swallowing  --Unfortunately [name] [name] continues to smoke, in spite of his poor wound healing and requirement for hyperbaric oxygen therapy.  --We discussed the importance of smoking cessation for his wound healing.  --I have referred him back to infectious disease, he previously saw [name] at [facility]. It may be worth considering getting a PICC line and cooling things down prior to any surgical management.  --Would  consider mandible debridement with pectoralis flap in the future. His girlfriend, he, and I discussed that we would likely not use any bone to re-establish continuity of the mandible but to provide tissue coverage for the remaining segments of mandible after it would be debrided using pectoralis muscle. To complete this, he may require a tracheostomy vs. Awake nasal intubation. It may be impossible to intubate him orally with the degree of his extreme trismus and sharp fragments of teeth which are remaining along his anterior mandible.  --Return to clinic 6 weeks for discussion of possible surgical planning should he have halted smoking   Other notable issues, if any:  Denies any symptoms of lymphedema to neck or jaw. Continues to deal with dry mouth and thick saliva. Reports occasional pain to left ear, as well as constant left sided jaw pain (unable to open mouth fully). Girlfriend reports a depressed appetite, but patient denies any nausea. Denies any diarrhea or constipation. Patient and girlfriend are interested in a second opinion regarding ENT surgery/management  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  I discussed with him the treatment options including observation, oral treatment vs IV and that IV therapy will give him the best opportunity to heal if this is due to infection.  I discussed that with the opening and discharge, infection is certainly likely and I am hopeful he will have some benefit with treatment.  He is able to take pills.   --Plan: 1) picc line 2) home health services 3) IV ceftriaxone 2 grams daily for 6 weeks 4) oral metronidazole 500 mg three times a day for 6 weeks 5) short stay for first dose --rtc  in 3 weeks to check on progress  Last saw medical oncologist ([name]) on 08/02/2020  Vitals:   10/08/20 1403  BP: 117/85  Pulse: 67  Resp: 20  Temp: 98.6 F (37 C)  SpO2: 99%

## 2020-10-09 ENCOUNTER — Encounter: Payer: Self-pay | Admitting: Radiation Oncology

## 2020-10-09 ENCOUNTER — Other Ambulatory Visit: Payer: Self-pay | Admitting: Radiation Oncology

## 2020-10-09 ENCOUNTER — Ambulatory Visit: Payer: Medicaid Other | Admitting: Internal Medicine

## 2020-10-12 DIAGNOSIS — M272 Inflammatory conditions of jaws: Secondary | ICD-10-CM | POA: Diagnosis not present

## 2020-10-12 DIAGNOSIS — M869 Osteomyelitis, unspecified: Secondary | ICD-10-CM | POA: Diagnosis not present

## 2020-10-14 DIAGNOSIS — M272 Inflammatory conditions of jaws: Secondary | ICD-10-CM | POA: Diagnosis not present

## 2020-10-14 DIAGNOSIS — M869 Osteomyelitis, unspecified: Secondary | ICD-10-CM | POA: Diagnosis not present

## 2020-10-18 DIAGNOSIS — C024 Malignant neoplasm of lingual tonsil: Secondary | ICD-10-CM | POA: Diagnosis not present

## 2020-10-19 DIAGNOSIS — M272 Inflammatory conditions of jaws: Secondary | ICD-10-CM | POA: Diagnosis not present

## 2020-10-19 DIAGNOSIS — M869 Osteomyelitis, unspecified: Secondary | ICD-10-CM | POA: Diagnosis not present

## 2020-10-21 DIAGNOSIS — M8620 Subacute osteomyelitis, unspecified site: Secondary | ICD-10-CM | POA: Diagnosis not present

## 2020-10-21 DIAGNOSIS — M272 Inflammatory conditions of jaws: Secondary | ICD-10-CM | POA: Diagnosis not present

## 2020-10-23 IMAGING — CT CT NECK WITH CONTRAST
4 of 5 series · 15 of 33 positions shown, 17 images · IV contrast (omnipaque)
Comparison: CT neck 10/10/2018 and PET CT 10/18/2018

CLINICAL DATA: Left neck swelling. Currently on chemo radiation.
Previously demonstrated left tonsillar mass.

EXAM:
CT NECK WITH CONTRAST
TECHNIQUE: Multidetector CT imaging of the neck was performed using the
standard protocol following the bolus administration of intravenous
contrast.
CONTRAST:  75mL OMNIPAQUE IOHEXOL 300 MG/ML  SOLN

[Series 3: neck 2.0 st · axial · 0.52mm/px · z∈[-241,-113]mm · 3 of 129 slices shown (1 of 3)]
[im 33/129  bone]
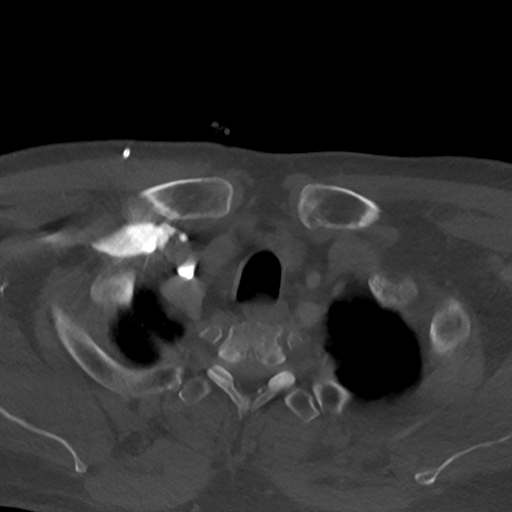
[im 65/129  bone]
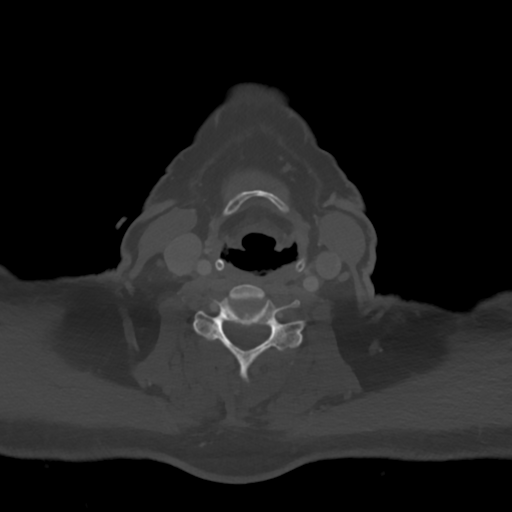
[im 97/129  bone]
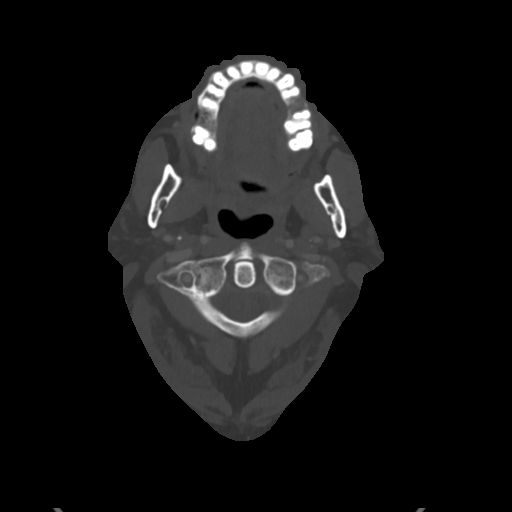

[Series 6: neck 2.0 st · sagittal · 0.50mm/px · 5 of 118 slices shown, 6 images (2 of 3)]
[im 40/118  bone]
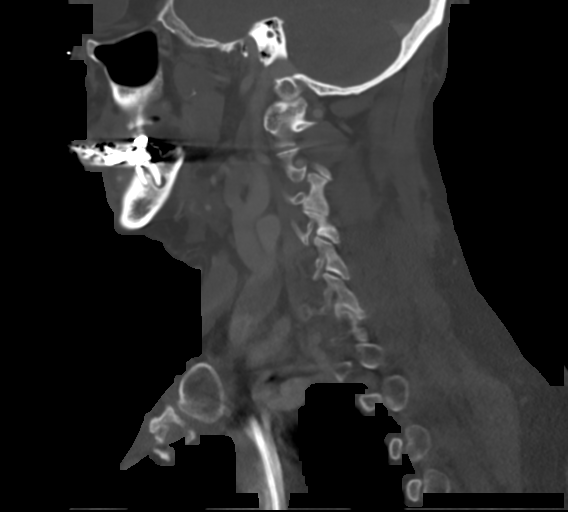
[im 49/118  bone]
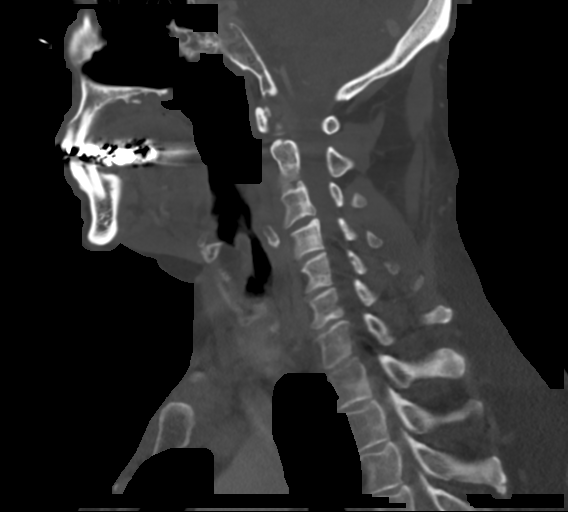
[im 59/118  soft-tissue]
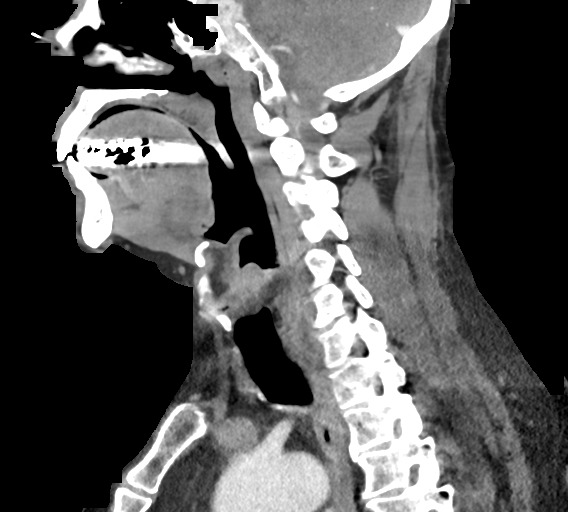
[im 59/118  bone]
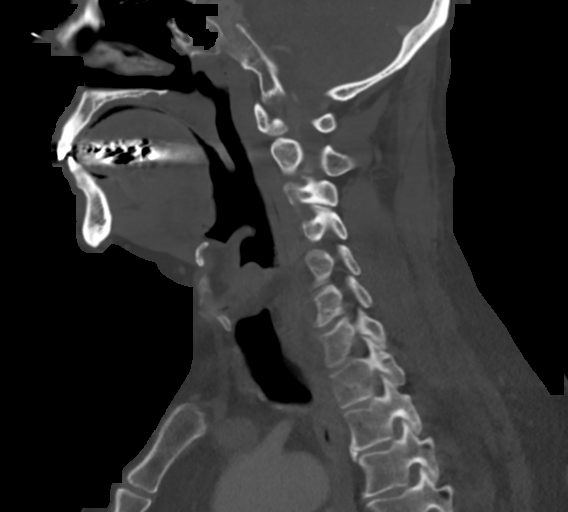
[im 69/118  bone]
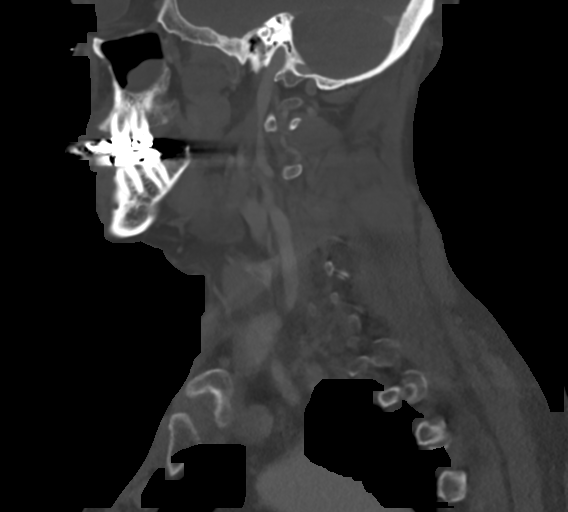
[im 79/118  bone]
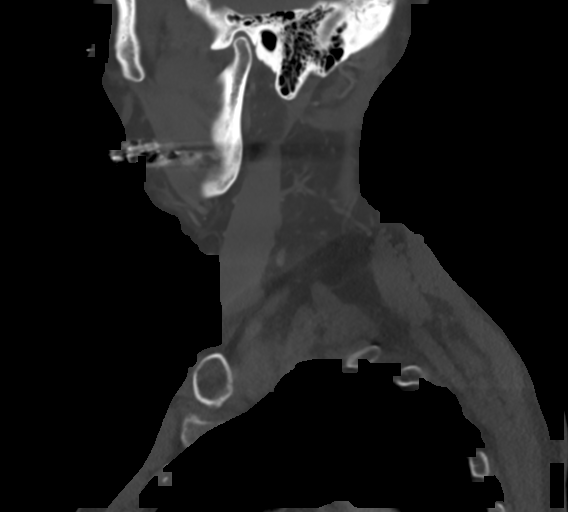

[Series 7: neck 2.0 st · coronal · 0.50mm/px · 3 of 145 slices shown (3 of 3)]
[im 29/145  bone]
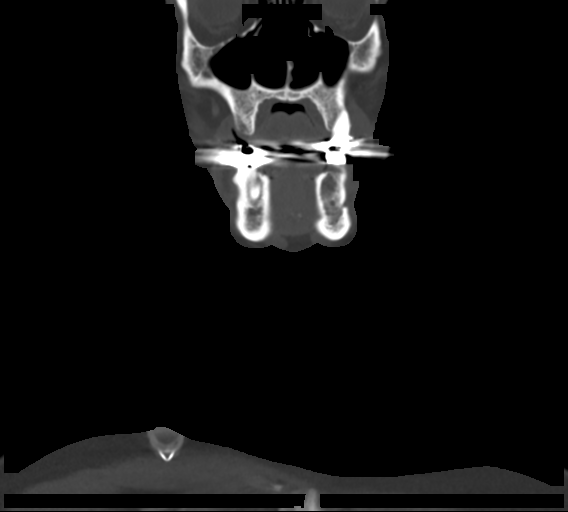
[im 58/145  bone]
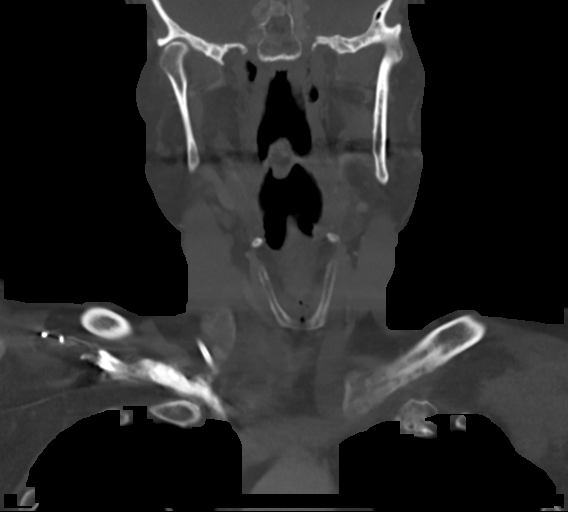
[im 87/145  bone]
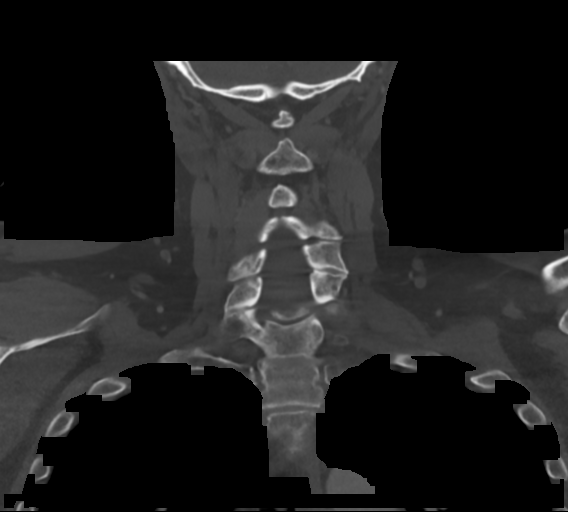

[Series 8: neck 2.0 st orthogonal · axial · 0.48mm/px · z∈[-286,-120]mm · 4 of 144 slices shown, 5 images]
[im 29/144  soft-tissue]
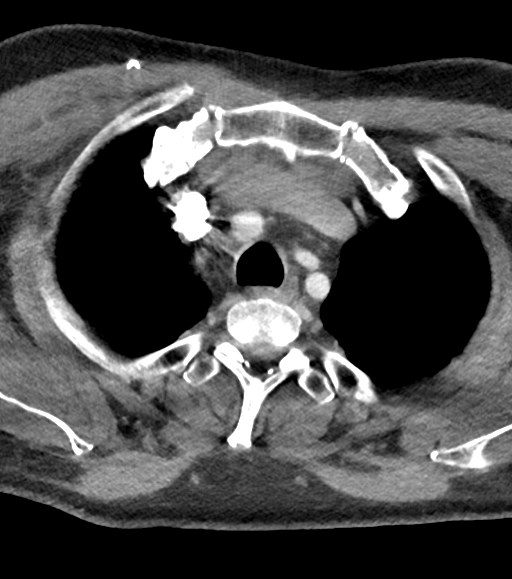
[im 29/144  bone]
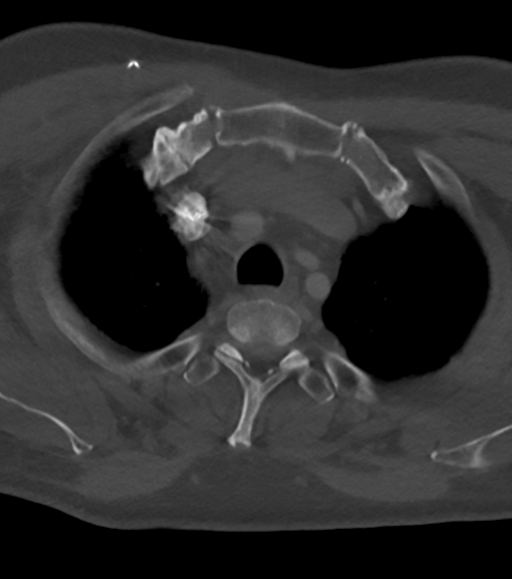
[im 58/144  bone]
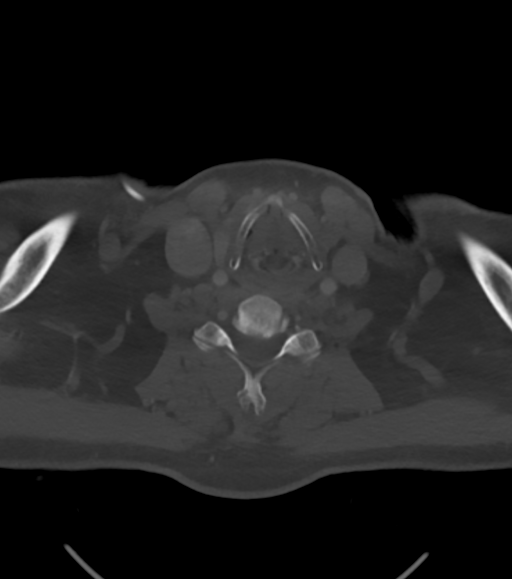
[im 86/144  bone]
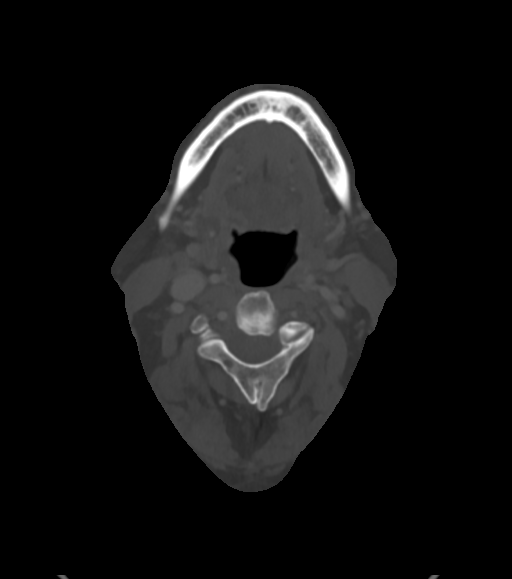
[im 115/144  bone]
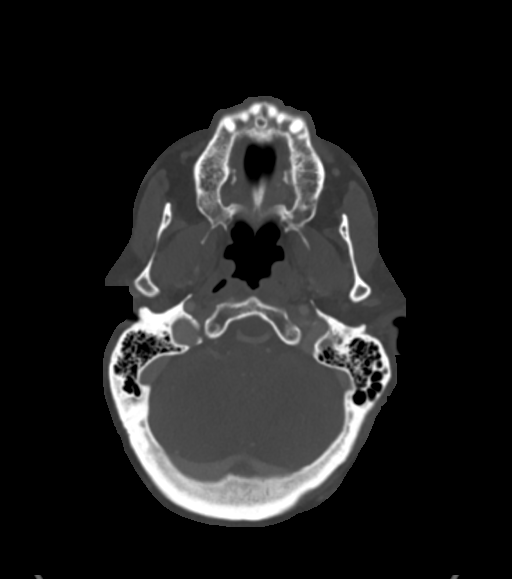

[15 of 33 positions shown; findings below may reference images not displayed]

FINDINGS: PHARYNX AND LARYNX:

--Nasopharynx: Fossae of Rogan are clear. Normal adenoid
tonsils for age.

--Oral cavity and oropharynx: Previously demonstrated fullness of
the left lingual tonsil has decreased. No discrete mass. No new
lesion.

--Hypopharynx: Normal vallecula and pyriform sinuses.

--Larynx: Normal epiglottis and pre-epiglottic space. Normal
aryepiglottic and vocal folds.

--Retropharyngeal space: No abscess, effusion or lymphadenopathy.

SALIVARY GLANDS:

--Parotid: No mass lesion or inflammation. No sialolithiasis or
ductal dilatation.

--Submandibular: Symmetric without inflammation. No sialolithiasis
or ductal dilatation.

--Sublingual: Normal. No ranula or other visible lesion of the base
of tongue and floor of mouth.

THYROID: Normal.

LYMPH NODES: No enlarged or abnormal density lymph nodes. Previously
demonstrated abnormal lymph nodes along the left cervical chain have
resolved. No new enlarged or abnormal density nodes.

VASCULAR: Major cervical vessels are patent.

LIMITED INTRACRANIAL: Normal.

VISUALIZED ORBITS: Normal.

MASTOIDS AND VISUALIZED PARANASAL SINUSES: No fluid levels or
advanced mucosal thickening. No mastoid effusion.

SKELETON: No bony spinal canal stenosis. No lytic or blastic
lesions.

UPPER CHEST: Clear.

OTHER: None.
IMPRESSION: 1. No acute abnormality of the cervical soft tissues. No new soft
tissue mass.
2. Markedly decreased fullness at the left lingual tonsillar site
identified on PET CT.
3. Resolution of left cervical lymphadenopathy.

## 2020-10-26 DIAGNOSIS — M869 Osteomyelitis, unspecified: Secondary | ICD-10-CM | POA: Diagnosis not present

## 2020-10-26 DIAGNOSIS — M272 Inflammatory conditions of jaws: Secondary | ICD-10-CM | POA: Diagnosis not present

## 2020-10-28 ENCOUNTER — Encounter: Payer: Self-pay | Admitting: Internal Medicine

## 2020-10-28 ENCOUNTER — Ambulatory Visit (INDEPENDENT_AMBULATORY_CARE_PROVIDER_SITE_OTHER): Payer: Medicaid Other | Admitting: Internal Medicine

## 2020-10-28 ENCOUNTER — Other Ambulatory Visit: Payer: Self-pay

## 2020-10-28 VITALS — BP 124/81 | HR 39 | Temp 98.0°F | Ht 71.0 in | Wt 230.0 lb

## 2020-10-28 DIAGNOSIS — Z5181 Encounter for therapeutic drug level monitoring: Secondary | ICD-10-CM | POA: Diagnosis not present

## 2020-10-28 DIAGNOSIS — M272 Inflammatory conditions of jaws: Secondary | ICD-10-CM

## 2020-10-28 DIAGNOSIS — Z452 Encounter for adjustment and management of vascular access device: Secondary | ICD-10-CM | POA: Diagnosis not present

## 2020-10-29 ENCOUNTER — Encounter: Payer: Self-pay | Admitting: Internal Medicine

## 2020-10-29 DIAGNOSIS — Z452 Encounter for adjustment and management of vascular access device: Secondary | ICD-10-CM | POA: Insufficient documentation

## 2020-10-29 DIAGNOSIS — Z5181 Encounter for therapeutic drug level monitoring: Secondary | ICD-10-CM | POA: Insufficient documentation

## 2020-10-29 NOTE — Progress Notes (Signed)
   Subjective:    Patient ID: [name] [name], male    DOB: Feb 17, 1961, 60 y.o.   MRN: [id number]  HPI Here for follow up of jaw osteomyelitis. He was started on empiric ceftriaxone IV with oral metronidazole and has been on it over 3 weeks.  His left jaw opening is draining much less and he is having less pain.  No associated rash or diarrhea.  He is feeling better with it.  No concerns voiced.     Review of Systems  Constitutional: Negative for chills and fever.  Gastrointestinal: Negative for diarrhea and nausea.  Skin: Negative for rash.       Objective:   Physical Exam HENT:     Head:     Comments: Left jaw area at the site of the opening is crusted, dry, minimal drainage noted, much improved from prior to treatment.  Eyes:     General: No scleral icterus. Neurological:     Mental Status: He is alert.    SH: + tobacco       Assessment & Plan:

## 2020-10-29 NOTE — Assessment & Plan Note (Signed)
Creat, LFTs wnl.  No concerns.  

## 2020-10-29 NOTE — Assessment & Plan Note (Signed)
Overall improving and no concerns.  His current antibiotics seem to be addressing the infection and will continue.  Plan for 6 weeks and will stop.  Inflammatory markers wnl.  Stop date 11/14/20.  He will follow up with me prior to that.  May do video visit.

## 2020-10-29 NOTE — Assessment & Plan Note (Signed)
picc line working well, no issues.  Plan to remove after treatment completion 11/14/20.

## 2020-11-02 DIAGNOSIS — M272 Inflammatory conditions of jaws: Secondary | ICD-10-CM | POA: Diagnosis not present

## 2020-11-02 DIAGNOSIS — M869 Osteomyelitis, unspecified: Secondary | ICD-10-CM | POA: Diagnosis not present

## 2020-11-04 ENCOUNTER — Encounter: Payer: Self-pay | Admitting: Internal Medicine

## 2020-11-04 DIAGNOSIS — M869 Osteomyelitis, unspecified: Secondary | ICD-10-CM | POA: Diagnosis not present

## 2020-11-04 DIAGNOSIS — M272 Inflammatory conditions of jaws: Secondary | ICD-10-CM | POA: Diagnosis not present

## 2020-11-07 ENCOUNTER — Telehealth: Payer: Self-pay | Admitting: Adult Health Nurse Practitioner

## 2020-11-07 NOTE — Telephone Encounter (Signed)
Returned patient's SO VM.  Patient needing refill on oxycodone 5 mg Q8hrs PRN for pain.  Checked [place] substance abuse database with no concerns and [facility] [place].  Patient is currently on IV and oral antibiotics for left jaw osteomyelitis/osteonecrosis with fracture.  Oncology ENT wanting to do extensive surgery and patient wanting second opinion at [facility] or [facility].  Will reach out to colleagues for any providers the patient can reach out to.  Have scheduled follow up appointment for 11/14/20 at 2:15pm Gawain Crombie K. Olena Heckle NP

## 2020-11-09 DIAGNOSIS — M272 Inflammatory conditions of jaws: Secondary | ICD-10-CM | POA: Diagnosis not present

## 2020-11-09 DIAGNOSIS — M869 Osteomyelitis, unspecified: Secondary | ICD-10-CM | POA: Diagnosis not present

## 2020-11-11 ENCOUNTER — Encounter: Payer: Self-pay | Admitting: Radiation Oncology

## 2020-11-11 DIAGNOSIS — M272 Inflammatory conditions of jaws: Secondary | ICD-10-CM | POA: Diagnosis not present

## 2020-11-11 DIAGNOSIS — M869 Osteomyelitis, unspecified: Secondary | ICD-10-CM | POA: Diagnosis not present

## 2020-11-11 NOTE — Progress Notes (Signed)
I let Mr [name] [name] know that I received a request for refill on his Pentoxifylline  and I think it is best that he not receive further refills as he has been on it for quite some time and I think that remaining on it chronically will have more risks than benefits.  I did tell him that it is reasonable for him to continue the vitamin E.  He has appointments lined up at [facility] for potential surgery.  I encouraged him to attend his appointments to discuss the team's recommendations. -----------------------------------  Eppie Gibson, MD

## 2020-11-14 ENCOUNTER — Telehealth (INDEPENDENT_AMBULATORY_CARE_PROVIDER_SITE_OTHER): Payer: Medicaid Other | Admitting: Internal Medicine

## 2020-11-14 ENCOUNTER — Other Ambulatory Visit: Payer: Medicaid Other | Admitting: Adult Health Nurse Practitioner

## 2020-11-14 ENCOUNTER — Encounter: Payer: Self-pay | Admitting: Adult Health Nurse Practitioner

## 2020-11-14 ENCOUNTER — Telehealth: Payer: Self-pay

## 2020-11-14 ENCOUNTER — Other Ambulatory Visit: Payer: Self-pay

## 2020-11-14 ENCOUNTER — Encounter: Payer: Self-pay | Admitting: Internal Medicine

## 2020-11-14 VITALS — BP 132/70 | HR 92

## 2020-11-14 DIAGNOSIS — M272 Inflammatory conditions of jaws: Secondary | ICD-10-CM

## 2020-11-14 DIAGNOSIS — S02609S Fracture of mandible, unspecified, sequela: Secondary | ICD-10-CM | POA: Diagnosis not present

## 2020-11-14 DIAGNOSIS — M869 Osteomyelitis, unspecified: Secondary | ICD-10-CM | POA: Diagnosis not present

## 2020-11-14 DIAGNOSIS — Z515 Encounter for palliative care: Secondary | ICD-10-CM

## 2020-11-14 DIAGNOSIS — R5383 Other fatigue: Secondary | ICD-10-CM | POA: Diagnosis not present

## 2020-11-14 NOTE — Telephone Encounter (Signed)
Received VM from patient's significant other that patient has decided he would like to try the low dose levothyroxine to see if it helps improve he fatigue.   Returned call to inform significant other that Dr. Isidore Moos is out of the office today, but I would relay information to her so prescription can be sent to patient's pharmacy tomorrow morning. Before hanging up she also mentioned that patient has an appointment this coming Monday to get established with a new PCP. Advised her that moving forward it would be best for PCP to manage thyroid monitoring and medication management since patient will see them more regularly. She verbalized understanding and agreement of recommendation. No other needs identified at this time, but she knows to call me back directly should something arise in the future.

## 2020-11-14 NOTE — Assessment & Plan Note (Signed)
Doing well, no concerns clinically and most recent inflammatory markers wnl.  Will plan to stop today and have picc line pulled.  He can rtc as needed.

## 2020-11-14 NOTE — Progress Notes (Signed)
   Subjective:    Patient ID: [name] [name], male    DOB: 07-Oct-1960, 60 y.o.   MRN: [id number]  HPI I connected with  TAJAE RYBICKI on 11/14/20 by a video enabled telemedicine application and verified that I am speaking with the correct person using two identifiers.   I discussed the limitations of evaluation and management by telemedicine. The patient expressed understanding and agreed to proceed.  Location: Patient - home Physician - clinic  Duration of visit: 15 minutes  Follow up is for jaw osteomyelitis. He has a history of throat cancer and is s/p radiation therapy and developed a concern for mandibular osteomyelitis radiographically and with a draining sinus.  I started him on empiric ceftriaxone and oral metronidazole with a plan for 6 weeks through today.  I saw him 3 weeks ago and he was doing well, draining sinus closing and much improved.  Same today, remains improved and no new concerns. No fever, no rash or diarrhea.   Review of Systems  Constitutional: Negative for chills and fever.  Gastrointestinal: Negative for diarrhea and nausea.  Skin: Negative for rash.       Objective:   Physical Exam HENT:     Mouth/Throat:     Comments: Closed area at previous sinus, no drainage Neurological:     Mental Status: He is alert.  Psychiatric:        Mood and Affect: Mood normal.           Assessment & Plan:

## 2020-11-14 NOTE — Progress Notes (Signed)
[name] [name] Note Telephone: [phone]  Fax: [phone]    Date of encounter: 11/14/20 PATIENT NAME: [name] [name] [place] [address]   [id number] (home)  DOB: Mar 15, 1961 MRN: [id number] PRIMARY CARE PROVIDER:    [name], MD,  [address] [place] [address] [phone]  REFERRING PROVIDER:   [name], MD [address] [place],  [place] [address] [phone]  RESPONSIBLE PARTY:    Contact Information    Name Relation Home Work Mobile   [place] Significant other [phone]     [place], Rico Daughter   [phone]       I met face to face with patient and family in home. Palliative Care was asked to follow this patient by consultation request of  [name] Hansen, MD to address advance care planning and complex medical decision making. This is a follow up visit.  Significant other, [name] [name], present during visit today                                   ASSESSMENT AND PLAN / RECOMMENDATIONS:   Advance Care Planning/Goals of Care: Goals include to maximize quality of life and symptom management.   CODE STATUS:  Full code  Symptom Management/Plan:  Osteomyelitis/fracture of left jaw: Patient does have appointment on Nov 26, 2020 for second opinion about jaw surgery.  Is being followed by ID.  Continue follow-up and recommendations by ID and ENT oncology  Fatigue: Patient has multiple things that could be causing fatigue that include infection, antibiotic treatment, hypothyroidism.  Have encouraged him to reach back out to Dr. Isidore [name] for prescription for levothyroxine to see if this would give any relief.   Follow up Palliative Care Visit: Palliative care will continue to follow for complex medical decision making, advance care planning, and clarification of goals.  Patient has uncertainty of upcoming appointments and currently surgery for jaw is  scheduled for May 20.  Will call in 8 weeks to schedule follow up visit.  Encouraged to call with any questions or concerns  I spent 60 minutes providing this consultation. More than 50% of the time in this consultation was spent in counseling and care coordination.  PPS: 50%  HOSPICE ELIGIBILITY/DIAGNOSIS: TBD  Chief Complaint: follow up palliative visit  HISTORY OF PRESENT ILLNESS:  [name] [name] is a 60 y.o. year old male  with tonsillar cancer,HTN, HLD, h/o DVT and PE on coumadin.  Patient has been receiving IV ceftriaxone and oral metronidazole for abscess and osteomyelitis of left jaw.  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  Patient is being seen by ENT oncology and is recommending surgery with pectoral flap.  Patient is wanting second opinion before having extensive surgery done on his jaw.  Patient is having some diarrhea related to the oral antibiotics.  This is not every day.  His appetite is not good but he supplements with boost and Carnation instant breakfast with milk.  His weight is staying stable at around 230 pounds.  Since starting the antibiotic therapy he has been having increased fatigue and tiredness.  Significant other did state an appointment with Dr. Isidore [name] but it was brought up that he may need thyroid replacement.  TSH on 08/02/2020 was 7.106.  At the time with Dr. Isidore [name] he declined thyroid replacement medication.  Rest  of 10 point ROS asked and negative except what is stated in HPI.  History obtained from review of EMR and interview with family  and [name] [name].  I reviewed available labs, medications, imaging, studies and related documents from the EMR.  Records reviewed and summarized above.    PHYSICAL EXAM:   General: patient in NAD Eyes: sclera anicteric and noninjected with no discharge noted ENMT:  Abscess to left side of jaw with crusting noted over opening Cardiovascular: RRR Pulmonary: lung sounds clear; normal respiratory  effort Extremities: no edema, no joint deformities Skin: no rashes on exposed skin Neurological: A&O x3  Thank you for the opportunity to participate in the care of [name] [name].  The palliative care team will continue to follow. Please call our office at [phone] if we can be of additional assistance.   [name] Baumbach Jenetta Downer, NP , DNP  This chart was dictated using voice recognition software. Despite best efforts to proofread, errors can occur which can change the documentation meaning.   COVID-19 PATIENT SCREENING TOOL Asked and negative response unless otherwise noted:   Have you had symptoms of covid, tested positive or been in contact with someone with symptoms/positive test in the past 5-10 days? negative

## 2020-11-15 ENCOUNTER — Other Ambulatory Visit: Payer: Self-pay | Admitting: Radiation Oncology

## 2020-11-15 DIAGNOSIS — C090 Malignant neoplasm of tonsillar fossa: Secondary | ICD-10-CM

## 2020-11-15 MED ORDER — LEVOTHYROXINE SODIUM 25 MCG PO TABS: 25.0000 ug | ORAL_TABLET | Freq: Every day | ORAL | 5 refills | Status: DC

## 2020-11-18 ENCOUNTER — Ambulatory Visit: Payer: Medicaid Other | Admitting: Student

## 2020-11-18 VITALS — BP 126/98 | HR 79 | Ht 71.0 in | Wt 226.5 lb

## 2020-11-18 DIAGNOSIS — M272 Inflammatory conditions of jaws: Secondary | ICD-10-CM

## 2020-11-18 DIAGNOSIS — E039 Hypothyroidism, unspecified: Secondary | ICD-10-CM | POA: Insufficient documentation

## 2020-11-18 DIAGNOSIS — C760 Malignant neoplasm of head, face and neck: Secondary | ICD-10-CM | POA: Diagnosis present

## 2020-11-18 NOTE — Assessment & Plan Note (Signed)
Patient was treated last year for throat cancer with radiation therapy.  Found to have TSH of 7.1 in January.  Was started on levothyroxine by his radiation oncologist, 25 mcg daily.  This was started 2 days ago.  - Check TSH in 4 to 6 weeks

## 2020-11-18 NOTE — Assessment & Plan Note (Addendum)
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 He is scheduled on May 20 (he may push this date back) for mandible debridement, possible trach,and pectoralis flap for coverage of the carotid artery. He is trepidatious about this surgery and would like a second opinion.  This is the main reason for his visit to our clinic today.  He has an appointment with a ENT next week, but is requesting another referral to [facility].  - Referral to [facility] ENT placed

## 2020-11-18 NOTE — Patient Instructions (Signed)
Thank you for allowing [place] to be a part of your care today, it was a pleasure seeing you. We discussed your throat cancer and jaw osteomyelitis.  I am happy to put a referral to [facility].  They will hopefully call you in the next 2 to 3 days, please call our office back and let her for them in this time.    Please follow up in 1 month to recheck thyroid enzymes and for regular follow-up with your PCP.   Thank you, and please call the Internal Medicine Clinic at [phone] if you have any questions.  Best, Dr. Bridgett Larsson

## 2020-11-18 NOTE — Progress Notes (Signed)
   CC: Chronic osteomyelitis of jaw  HPI:  [name] is a 60 y.o. 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  He has surgery planned on 5/24 pectoralis flap for his job.  Please refer to problem based charting for further details of assessment and plan of current problem and chronic medical conditions.  Past Medical History:  Diagnosis Date  . 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 Benign essential HTN 01/04/2012  . Cancer ([place])    tonsilar  . Chronic anticoagulation 01/02/2013  . Dermatofibroma of forearm 01/02/2013   Left side  . Hyperlipidemia, mixed 01/04/2012  . 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 Primary hypercoagulable state ([place]) 10/01/2014  . Sinus bradycardia, chronic 01/04/2012  . Superficial thrombosis of lower extremity 05/02/2012   Review of Systems:   Review of Systems  Constitutional: Negative for chills and fever.  HENT: Positive for sore throat.   All other systems reviewed and are negative.    Physical Exam: Vitals:   11/18/20 1547  BP: (!) 126/98  Pulse: 79  SpO2: 100%  Weight: 226 lb 8 oz (102.7 kg)  Height: 5\' 11"  (1.803 m)   Constitutional: no acute distress Head: atraumatic ENT: There is a small sinus tract on the left jaw with minimal amount of sanguinous drainage.  No evidence of inflammation, edema, erythema at this time.  There are postradiation changes of the left jaw. Skin: warm and dry Neurological: alert, no focal deficit Psychiatric: normal mood and affect  Assessment & Plan:   See Encounters Tab for problem based  charting.  Patient discussed with Dr. Dareen Piano

## 2020-11-21 NOTE — Progress Notes (Signed)
Internal Medicine Clinic Attending  Case discussed with Dr. Chen  At the time of the visit.  We reviewed the resident's history and exam and pertinent patient test results.  I agree with the assessment, diagnosis, and plan of care documented in the resident's note. 

## 2020-11-21 NOTE — Addendum Note (Signed)
Addended by: Andrew Au on: 11/21/2020 01:24 PM   Modules accepted: Level of Service

## 2020-11-25 ENCOUNTER — Other Ambulatory Visit: Payer: Self-pay

## 2020-11-25 ENCOUNTER — Emergency Department (HOSPITAL_BASED_OUTPATIENT_CLINIC_OR_DEPARTMENT_OTHER): Payer: Medicaid Other

## 2020-11-25 ENCOUNTER — Encounter (HOSPITAL_COMMUNITY): Payer: Self-pay | Admitting: Pharmacy Technician

## 2020-11-25 ENCOUNTER — Telehealth: Payer: Self-pay | Admitting: Internal Medicine

## 2020-11-25 ENCOUNTER — Emergency Department (HOSPITAL_COMMUNITY)
Admission: EM | Admit: 2020-11-25 | Discharge: 2020-11-25 | Disposition: A | Discharge status: Home / Self Care | Payer: Medicaid Other | Attending: Emergency Medicine | Admitting: Emergency Medicine

## 2020-11-25 ENCOUNTER — Telehealth: Payer: Self-pay

## 2020-11-25 DIAGNOSIS — Z7901 Long term (current) use of anticoagulants: Secondary | ICD-10-CM | POA: Insufficient documentation

## 2020-11-25 DIAGNOSIS — Z85818 Personal history of malignant neoplasm of other sites of lip, oral cavity, and pharynx: Secondary | ICD-10-CM | POA: Insufficient documentation

## 2020-11-25 DIAGNOSIS — I10 Essential (primary) hypertension: Secondary | ICD-10-CM | POA: Insufficient documentation

## 2020-11-25 DIAGNOSIS — Z85828 Personal history of other malignant neoplasm of skin: Secondary | ICD-10-CM | POA: Insufficient documentation

## 2020-11-25 DIAGNOSIS — L538 Other specified erythematous conditions: Secondary | ICD-10-CM

## 2020-11-25 DIAGNOSIS — Z79899 Other long term (current) drug therapy: Secondary | ICD-10-CM | POA: Diagnosis not present

## 2020-11-25 DIAGNOSIS — F17210 Nicotine dependence, cigarettes, uncomplicated: Secondary | ICD-10-CM | POA: Insufficient documentation

## 2020-11-25 DIAGNOSIS — E039 Hypothyroidism, unspecified: Secondary | ICD-10-CM | POA: Insufficient documentation

## 2020-11-25 DIAGNOSIS — I82621 Acute embolism and thrombosis of deep veins of right upper extremity: Secondary | ICD-10-CM | POA: Diagnosis not present

## 2020-11-25 DIAGNOSIS — M7989 Other specified soft tissue disorders: Secondary | ICD-10-CM | POA: Diagnosis present

## 2020-11-25 DIAGNOSIS — I82A11 Acute embolism and thrombosis of right axillary vein: Secondary | ICD-10-CM | POA: Insufficient documentation

## 2020-11-25 DIAGNOSIS — R609 Edema, unspecified: Secondary | ICD-10-CM

## 2020-11-25 LAB — COMPREHENSIVE METABOLIC PANEL
ALT: 19 U/L (ref 0–44)
AST: 25 U/L (ref 15–41)
Albumin: 3.6 g/dL (ref 3.5–5.0)
Alkaline Phosphatase: 73 U/L (ref 38–126)
Anion gap: 8 (ref 5–15)
BUN: 7 mg/dL (ref 6–20)
CO2: 23 mmol/L (ref 22–32)
Calcium: 8.9 mg/dL (ref 8.9–10.3)
Chloride: 105 mmol/L (ref 98–111)
Creatinine, Ser: 0.78 mg/dL (ref 0.61–1.24)
GFR, Estimated: >60 mL/min (ref >60)
Glucose, Bld: 106 mg/dL — ABNORMAL HIGH (ref 70–99)
Potassium: 3.5 mmol/L (ref 3.5–5.1)
Sodium: 136 mmol/L (ref 135–145)
Total Bilirubin: 0.8 mg/dL (ref 0.3–1.2)
Total Protein: 6.7 g/dL (ref 6.5–8.1)

## 2020-11-25 LAB — PROTIME-INR
INR: 3.7 — ABNORMAL HIGH (ref 0.8–1.2)
Prothrombin Time: 36.8 seconds — ABNORMAL HIGH (ref 11.4–15.2)

## 2020-11-25 LAB — CBC WITH DIFFERENTIAL/PLATELET
Abs Immature Granulocytes: 0.01 10*3/uL (ref 0.00–0.07)
Basophils Absolute: 0 10*3/uL (ref 0.0–0.1)
Basophils Relative: 0 %
Eosinophils Absolute: 0.1 10*3/uL (ref 0.0–0.5)
Eosinophils Relative: 2 %
HCT: 49.5 % (ref 39.0–52.0)
Hemoglobin: 17 g/dL (ref 13.0–17.0)
Immature Granulocytes: 0 %
Lymphocytes Relative: 15 %
Lymphs Abs: 1 10*3/uL (ref 0.7–4.0)
MCH: 33.3 pg (ref 26.0–34.0)
MCHC: 34.3 g/dL (ref 30.0–36.0)
MCV: 96.9 fL (ref 80.0–100.0)
Monocytes Absolute: 0.7 10*3/uL (ref 0.1–1.0)
Monocytes Relative: 10 %
Neutro Abs: 5.2 10*3/uL (ref 1.7–7.7)
Neutrophils Relative %: 73 %
Platelets: 159 10*3/uL (ref 150–400)
RBC: 5.11 MIL/uL (ref 4.22–5.81)
RDW: 13.6 % (ref 11.5–15.5)
WBC: 7.1 10*3/uL (ref 4.0–10.5)
nRBC: 0 % (ref 0.0–0.2)

## 2020-11-25 LAB — LACTIC ACID, PLASMA: Lactic Acid, Venous: 1.3 mmol/L (ref 0.5–1.9)

## 2020-11-25 MED ORDER — METOPROLOL SUCCINATE ER 25 MG PO TB24
25.0000 mg | ORAL_TABLET | Freq: Every day | ORAL | Status: DC
  Administered 2020-11-25: 25 mg via ORAL
  Filled 2020-11-25: qty 1

## 2020-11-25 NOTE — ED Notes (Signed)
Pt was able to ambulate to the restroom.  

## 2020-11-25 NOTE — Telephone Encounter (Signed)
Agree, thank you

## 2020-11-25 NOTE — ED Triage Notes (Signed)
Pt here pov with reports of redness and hard knot to R arm after midline removal yesterday. Pt recently stopped IV rocephin and oral abx for osteomyelitis.

## 2020-11-25 NOTE — Telephone Encounter (Signed)
Pt requesting a call back. 

## 2020-11-25 NOTE — Discharge Instructions (Addendum)
If at any point you develop chest pain, difficulty breathing, lightheadedness, passing out, you should return immediately to ER for reassessment.  If you develop worsening swelling, redness, fever, I would additionally recommend return to ER for reassessment.  Dr. Alvy Bimler has recommended close follow-up with Dr. Lorenso Courier and continuing the Coumadin and using Ace wrap for some gentle compression.

## 2020-11-25 NOTE — Progress Notes (Signed)
Right upper extremity venous duplex completed. Refer to "CV Proc" under chart review to view preliminary results.  11/25/2020 4:47 PM Kelby Aline., MHA, RVT, RDCS, [place], RVS

## 2020-11-25 NOTE — ED Provider Notes (Signed)
[facility] EMERGENCY DEPARTMENT Provider Note   CSN: [id number] Arrival date & time: 11/25/20  1536     History No chief complaint on file.   [name] [name] is a 60 y.o. male. 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 Lorenso Courier with hematology.  Remote history of PE in late 1990s, recurrent DVT, currently on Coumadin.  Significant other at bedside who also is a vascular nurse states that she pulled the midline catheter that was used for IV antibiotics on Tuesday.  No complications with removal of the catheter.  Then on Wednesday noted some slight redness and swelling of his right arm.  Swelling has steadily progressed since that time.  Patient states that he struggles with general fatigue but this is on going for many weeks to months.  He denies any chest pain or difficulty in breathing.  Reports hx paryoxysmal afib, hasn't taken his daily metoprolol yet.  HPI     Past Medical History:  Diagnosis Date  . 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 Benign essential HTN 01/04/2012  . Cancer ([place])    tonsilar  . Chronic anticoagulation 01/02/2013  . Dermatofibroma of forearm 01/02/2013   Left side  . Hyperlipidemia, mixed 01/04/2012  . 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 Primary hypercoagulable state ([place]) 10/01/2014  . Sinus bradycardia, chronic 01/04/2012  . Superficial thrombosis of lower extremity 05/02/2012    Patient Active Problem List   Diagnosis Date Noted  . Hypothyroidism (acquired) 11/18/2020  . Medication monitoring encounter 10/29/2020  . PICC (peripherally inserted central catheter) in place 10/29/2020  . Chronic  osteomyelitis of mandible 09/30/2020  . Encounter for cardiac risk counseling 11/08/2019  . Tonsillar mass 10/05/2019  . Hypokalemia 08/21/2019  . Warfarin anticoagulation 02/20/2019  . Hypercholesteremia 02/07/2019  . Recurrent deep vein thrombosis (DVT) ([place]) 02/06/2019  . Thoracic ascending aortic aneurysm ([place]) 02/06/2019  . Port-A-Cath in place 01/18/2019  . Mucositis due to chemotherapy 12/07/2018  . Hypomagnesemia 12/07/2018  . Thrush 12/07/2018  . 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 Malignant neoplasm of tonsillar fossa ([place]) 10/17/2018  . 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 DVT, lower extremity, proximal ([place]) 05/24/2013  . Chronic anticoagulation 01/02/2013  . Superficial thrombosis of lower extremity 05/02/2012  . Benign essential HTN 01/04/2012  . Hyperlipidemia, mixed 01/04/2012    Past Surgical History:  Procedure Laterality Date  . DIRECT LARYNGOSCOPY Left 10/19/2018   Procedure: DIRECT LARYNGOSCOPY;  Surgeon: Leta Baptist, MD;  Location: [facility];  Service: ENT;  Laterality: Left;  . IR IMAGING GUIDED PORT INSERTION  11/04/2018  . IR REMOVAL TUN ACCESS W/ PORT W/O FL MOD SED  05/01/2019  . TONSILLECTOMY Left 10/19/2018   Procedure: BIOPSY OF LEFT TONSIL;  Surgeon: Leta Baptist, MD;  Location: [facility];  Service: ENT;  Laterality: Left;       Family History  Problem Relation Age of Onset  . Stroke Father     Social History   Tobacco Use  . Smoking status: Light Tobacco Smoker    Packs/day: 0.50    Years: 30.00  Pack years: 15.00    Types: Cigarettes    Last attempt to quit: 10/19/2018    Years since quitting: 2.1  . Smokeless tobacco: Never Used  Vaping Use  . Vaping Use: Former  . Substances: Nicotine, Flavoring  . Devices: 1 cartridge q2-3 days, he tells me he is not using.   Substance Use Topics  . Alcohol  use: Yes    Alcohol/week: 13.0 - 14.0 standard drinks    Types: 12 Cans of beer, 1 - 2 Shots of liquor per week    Comment: 1-2 times per week.  . Drug use: No    Home Medications Prior to Admission medications   Medication Sig Start Date End Date Taking? Authorizing Provider  atorvastatin (LIPITOR) 20 MG tablet Take 1 tablet (20 mg total) by mouth daily. 02/07/19 02/07/20  Elige Radonhristian, Rylee, MD  cefTRIAXone (ROCEPHIN) 10 g injection  11/07/20   [provider]  cefTRIAXone (ROCEPHIN) 2-2.22 GM-%(50ML) IVPB Inject 50 mLs (2 g total) into the vein daily. 09/30/20   Comer, Belia Hemanobert W, MD  cyclobenzaprine (FLEXERIL) 10 MG tablet TAKE 1 TABLET BY MOUTH THREE TIMES A DAY AS NEEDED FOR MUSCLE SPASMS 08/05/20   Jaci Standardorsey, John T IV, MD  gabapentin (NEURONTIN) 100 MG capsule Take 200 mg by mouth every 8 (eight) hours as needed. 11/10/19   [provider]  glycopyrrolate (ROBINUL) 1 MG tablet Take 1-2 tablet by mouth every eight hours as needed take 1-2 tabs every eight hours as needed for secretions 09/28/19   [provider]  ibuprofen (ADVIL) 200 MG tablet Take 600 mg by mouth every 6 (six) hours as needed for pain or fever.     [provider]  levothyroxine (SYNTHROID) 25 MCG tablet Take 1 tablet (25 mcg total) by mouth daily before breakfast. Take on empty stomach w/ water, 1 hour before other meds or food. 11/15/20   Lonie PeakSquire, Sarah, MD  metoprolol succinate (TOPROL-XL) 25 MG 24 hr tablet TAKE 1 TABLET BY MOUTH EVERY DAY 01/08/20   Elige Radonhristian, Rylee, MD  metroNIDAZOLE (FLAGYL) 500 MG tablet Take 1 tablet (500 mg total) by mouth 3 (three) times daily. 42 days total 09/30/20   Comer, Belia Hemanobert W, MD  oxyCODONE (OXY IR/ROXICODONE) 5 MG immediate release tablet Take 5 mg by mouth every 8 (eight) hours as needed. 10/17/19   [provider]  pentoxifylline (TRENTAL) 400 MG CR tablet Take by mouth. 09/05/20   [provider]  sodium fluoride (PREVIDENT 5000 PLUS) 1.1 % CREA  dental cream Apply to tooth brush. Brush teeth for 2 minutes. Spit out excess. DO NOT rinse afterwards. Repeat nightly. 10/24/18   Charlynne PanderKulinski, Ronald F, DDS  vitamin E 180 MG (400 UNITS) capsule Take 400 IU daily x 1 week, then 400 IU BID 09/08/19   Lonie PeakSquire, Sarah, MD  warfarin (COUMADIN) 10 MG tablet Take 1 tablet of 10 mg warfarin daily except on Sundays, Thursdays and Saturdays, take only 1/2 tablet (5 mg). 10/09/19   Elicia LampGroce, James B, RPH-CPP    Allergies    Patient has no known allergies.  Review of Systems   Review of Systems  Constitutional: Negative for chills and fever.  HENT: Negative for ear pain and sore throat.   Eyes: Negative for pain and visual disturbance.  Respiratory: Negative for cough and shortness of breath.   Cardiovascular: Negative for chest pain and palpitations.  Gastrointestinal: Negative for abdominal pain and vomiting.  Genitourinary: Negative for dysuria and hematuria.  Musculoskeletal: Positive for arthralgias  and myalgias. Negative for back pain.  Skin: Negative for color change and rash.  Neurological: Negative for seizures and syncope.  All other systems reviewed and are negative.   Physical Exam Updated Vital Signs BP (!) 132/99   Pulse 94   Temp 97.8 F (36.6 C) (Oral)   Resp 20   Ht 6\' 3"  (1.905 m)   Wt 99.8 kg   SpO2 99%   BMI 27.50 kg/m   Physical Exam Vitals and nursing note reviewed.  Constitutional:      Appearance: He is well-developed.  HENT:     Head: Normocephalic and atraumatic.  Eyes:     Conjunctiva/sclera: Conjunctivae normal.  Cardiovascular:     Rate and Rhythm: Normal rate and regular rhythm.     Heart sounds: No murmur heard.   Pulmonary:     Effort: Pulmonary effort is normal. No respiratory distress.     Breath sounds: Normal breath sounds.  Abdominal:     Palpations: Abdomen is soft.     Tenderness: There is no abdominal tenderness.  Musculoskeletal:     Cervical back: Neck supple.     Comments: RUE: normal  radial pulse, normal sensation, there is mild to moderate generalized swelling of the extremity, non pitting, no abnormal warmth, slight redness noted over distal upper arm and proximal forearm  Skin:    General: Skin is warm and dry.  Neurological:     General: No focal deficit present.     Mental Status: He is alert.  Psychiatric:        Mood and Affect: Mood normal.     ED Results / Procedures / Treatments   Labs (all labs ordered are listed, but only abnormal results are displayed) Labs Reviewed  COMPREHENSIVE METABOLIC PANEL - Abnormal; Notable for the following components:      Result Value   Glucose, Bld 106 (*)    All other components within normal limits  PROTIME-INR - Abnormal; Notable for the following components:   Prothrombin Time 36.8 (*)    INR 3.7 (*)    All other components within normal limits  CULTURE, BLOOD (ROUTINE X 2)  CULTURE, BLOOD (ROUTINE X 2)  CBC WITH DIFFERENTIAL/PLATELET  LACTIC ACID, PLASMA    EKG None  Radiology UE VENOUS DUPLEX ([facility] & [facility] 7 am - 7 pm)  Result Date: 11/25/2020 UPPER VENOUS STUDY  Patient Name:  KYAH ROSSER  Date of Exam:   11/25/2020 Medical Rec #: [id number]       Accession #:    [id number] Date of Birth: 1961/03/16       Patient Gender: M Patient Age:   59Y Exam Location:  [facility] Procedure:      VAS [place] UPPER EXTREMITY VENOUS DUPLEX Referring Phys: [id number] LAURA A MURPHY --------------------------------------------------------------------------------  Indications: Edema, and Erythema Comparison Study: No prior study Performing Technologist: Abram Sander RVS Supporting Technologist: Maudry Mayhew MHA, RDMS, RVT, RDCS  Examination Guidelines: A complete evaluation includes B-mode imaging, spectral Doppler, color Doppler, and power Doppler as needed of all accessible portions of each vessel. Bilateral testing is considered an integral part of a complete examination. Limited examinations for reoccurring indications may  be performed as noted.  Right Findings: +----------+------------+---------+-----------+----------+-------+ RIGHT     CompressiblePhasicitySpontaneousPropertiesSummary +----------+------------+---------+-----------+----------+-------+ IJV           Full       Yes       Yes                      +----------+------------+---------+-----------+----------+-------+  Subclavian                         No               Chronic +----------+------------+---------+-----------+----------+-------+ Axillary      None                 No                Acute  +----------+------------+---------+-----------+----------+-------+ Brachial      None                 No                Acute  +----------+------------+---------+-----------+----------+-------+ Radial        Full                                          +----------+------------+---------+-----------+----------+-------+ Ulnar         Full                                          +----------+------------+---------+-----------+----------+-------+ Cephalic      None                 No                Acute  +----------+------------+---------+-----------+----------+-------+ Basilic       None                 No                Acute  +----------+------------+---------+-----------+----------+-------+  Left Findings: +----------+------------+---------+-----------+----------+-------+ LEFT      CompressiblePhasicitySpontaneousPropertiesSummary +----------+------------+---------+-----------+----------+-------+ Subclavian               Yes       Yes                      +----------+------------+---------+-----------+----------+-------+  Summary:  Right: Findings consistent with acute deep vein thrombosis involving the right axillary vein and right brachial veins. Findings consistent with acute superficial vein thrombosis involving the right basilic vein and right cephalic vein. 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  Left: No evidence of thrombosis in the subclavian.  *See table(s) above for measurements and observations.    Preliminary     Procedures Procedures   Medications Ordered in ED Medications - No data to display  ED Course  I have reviewed the triage vital signs and the nursing notes.  Pertinent labs & imaging results that were available during my care of the patient were reviewed by me and considered in my medical decision making (see chart for details).  Clinical Course as of 11/26/20 0045  Mon Nov 25, 2020  1803 D/w Alvy Bimler - recommend close out pt f/u with Lorenso Courier, continue coumadin therapy, ace wrap compression [RD]    Clinical Course User Index [RD] Lucrezia Starch, MD   MDM Rules/Calculators/A&P                         60 year old male with history of head and neck cancer, DVT/PE currently on Coumadin presenting to ER with concern for right arm swelling.  Recently completed  course of IV antibiotics and had midline catheter removed.  On exam, noted to have some swelling of the right upper extremity, good pulses.  DVT study concerning for acute DVT of the axillary and brachial veins as well as superficial in the right basilic and cephalic vein as well as chronic right subclavian clot.  His INR level was 3.7 today.  Discussed the case in detail with Dr. Simeon Craft such.  She recommended continuing current therapy, following up closely with Dr. Lorenso Courier but holding off on making any changes and the anticoagulation regimen.  Patient otherwise appeared well and denied other associated symptoms, no systemic symptoms, no symptoms of PE.  Given this and his stable vitals, believe he can be discharged and managed in the outpatient setting.  Patient demonstrated good understanding, discussed all these findings with his daughter via phone and significant other at bedside.  Reviewed stricter precautions and discharged.  After the discussed management  above, the patient was determined to be safe for discharge.  The patient was in agreement with this plan and all questions regarding their care were answered.  ED return precautions were discussed and the patient will return to the ED with any significant worsening of condition.   Final Clinical Impression(s) / ED Diagnoses Final diagnoses:  Acute deep vein thrombosis (DVT) of axillary vein of right upper extremity [facility])    Rx / DC Orders ED Discharge Orders    None       Lucrezia Starch, MD 11/26/20 0045

## 2020-11-25 NOTE — ED Notes (Signed)
All appropriate discharge materials reviewed at length with patient. Time for questions provided. Pt has no other questions at this time and verbalizes understanding of all provided materials.  

## 2020-11-25 NOTE — Telephone Encounter (Signed)
TC transferred to RN from front desk.  Patient states he had a PICC line to his right arm which was supposed to be discontinued by [facility], but HH did not show up to discontinue the PICC so his wife pulled the PICC line out on 11/19/20 (He states his wife is an IV nurse at [employer]).  Pt states on the following day, his right arm started to swell w/ redness and warmth.  He still has redness, swelling, and warmth from his elbow down.  RN notes slight SOB with conversation, RN asked patient if he is SOB and he states some SOB, then changes answer to not really.  (Pt has a hx of DVT's and PE).  RN instructed patient to go to the ED for evaluation, he verbalized understanding.  Wife overheard in background that they are not going to the ED and waiting all day, they will call ID because ID ordered the PICC.  RN informed patient that he may certainly call ID, but the recommendation of [facility] office is he needs to be evaluated and to present to ED. SChaplin, RN,BSN

## 2020-11-25 NOTE — Telephone Encounter (Signed)
Received call from patient, he states home health never came to remove his PICC line so his wife pulled it on 11/19/20. He states his wife is an IV [employer] scientist. He began to notice some swelling, redness, and soreness on 11/20/20. Patient states he is concerned he has a blood clot. RN advised patient he needs to be evaluated in the emergency room. Patient verbalized understanding and has no further questions.   Beryle Flock, RN

## 2020-11-25 NOTE — ED Notes (Signed)
Pt. Transported to # 54, wife requested to get an arm band for visitations.

## 2020-11-25 NOTE — ED Provider Notes (Signed)
Emergency Medicine Provider Triage Evaluation Note  [name] [name] , a 60 y.o. male  was evaluated in triage.  Pt complains of PICC line removed from right arm on Tuesday, onset of swelling and redness on Wednesday, progressively worsening.  History of DVT, on Coumadin however unable to recall recent INR and has been on multiple rounds of IV antibiotics..  Review of Systems  Positive: Redness, swelling, pain Negative: Numbness  Physical Exam  BP (!) 130/97 (BP Location: Right Arm)   Pulse (!) 113   Temp 97.8 F (36.6 C) (Oral)   Resp 17   SpO2 100%  Gen:   Awake, no distress   Resp:  Normal effort  MSK:   Moves extremities without difficulty  Other:    Medical Decision Making  Medically screening exam initiated at 3:58 PM.  Appropriate orders placed.  [name] [name] was informed that the remainder of the evaluation will be completed by another provider, this initial triage assessment does not replace that evaluation, and the importance of remaining in the ED until their evaluation is complete.     Tacy Learn, PA-C 11/25/20 1559    Milton Ferguson, MD 11/26/20 [phone]

## 2020-11-26 ENCOUNTER — Telehealth: Payer: Self-pay

## 2020-11-26 ENCOUNTER — Other Ambulatory Visit (HOSPITAL_COMMUNITY): Payer: Self-pay

## 2020-11-26 ENCOUNTER — Telehealth: Payer: Self-pay | Admitting: Hematology and Oncology

## 2020-11-26 ENCOUNTER — Telehealth: Payer: Self-pay | Admitting: *Deleted

## 2020-11-26 ENCOUNTER — Encounter: Payer: Self-pay | Admitting: Physician Assistant

## 2020-11-26 ENCOUNTER — Inpatient Hospital Stay: Payer: Medicaid Other | Attending: Physician Assistant | Admitting: Physician Assistant

## 2020-11-26 VITALS — BP 123/97 | HR 129 | Temp 97.3°F | Resp 19 | Wt 222.7 lb

## 2020-11-26 DIAGNOSIS — C099 Malignant neoplasm of tonsil, unspecified: Secondary | ICD-10-CM | POA: Diagnosis not present

## 2020-11-26 DIAGNOSIS — Y842 Radiological procedure and radiotherapy as the cause of abnormal reaction of the patient, or of later complication, without mention of misadventure at the time of the procedure: Secondary | ICD-10-CM | POA: Diagnosis not present

## 2020-11-26 DIAGNOSIS — F17210 Nicotine dependence, cigarettes, uncomplicated: Secondary | ICD-10-CM | POA: Diagnosis not present

## 2020-11-26 DIAGNOSIS — Z7901 Long term (current) use of anticoagulants: Secondary | ICD-10-CM | POA: Insufficient documentation

## 2020-11-26 DIAGNOSIS — I825Y1 Chronic embolism and thrombosis of unspecified deep veins of right proximal lower extremity: Secondary | ICD-10-CM | POA: Diagnosis not present

## 2020-11-26 DIAGNOSIS — I82621 Acute embolism and thrombosis of deep veins of right upper extremity: Secondary | ICD-10-CM | POA: Diagnosis not present

## 2020-11-26 DIAGNOSIS — Z79899 Other long term (current) drug therapy: Secondary | ICD-10-CM | POA: Diagnosis not present

## 2020-11-26 DIAGNOSIS — I82611 Acute embolism and thrombosis of superficial veins of right upper extremity: Secondary | ICD-10-CM | POA: Diagnosis not present

## 2020-11-26 DIAGNOSIS — C090 Malignant neoplasm of tonsillar fossa: Secondary | ICD-10-CM

## 2020-11-26 DIAGNOSIS — C024 Malignant neoplasm of lingual tonsil: Secondary | ICD-10-CM | POA: Diagnosis not present

## 2020-11-26 DIAGNOSIS — M8730 Other secondary osteonecrosis, unspecified bone: Secondary | ICD-10-CM | POA: Diagnosis not present

## 2020-11-26 DIAGNOSIS — I82A11 Acute embolism and thrombosis of right axillary vein: Secondary | ICD-10-CM | POA: Diagnosis not present

## 2020-11-26 DIAGNOSIS — E039 Hypothyroidism, unspecified: Secondary | ICD-10-CM | POA: Insufficient documentation

## 2020-11-26 DIAGNOSIS — B370 Candidal stomatitis: Secondary | ICD-10-CM | POA: Insufficient documentation

## 2020-11-26 MED ORDER — ENOXAPARIN SODIUM 100 MG/ML IJ SOSY: 100.0000 mg | PREFILLED_SYRINGE | Freq: Two times a day (BID) | INTRAMUSCULAR | 1 refills | Status: DC

## 2020-11-26 MED ORDER — ENOXAPARIN SODIUM 100 MG/ML IJ SOSY
100.0000 mg | PREFILLED_SYRINGE | Freq: Two times a day (BID) | INTRAMUSCULAR | 0 refills | Status: DC
  Filled 2020-11-26: qty 20, 10d supply, fill #0
  Filled 2020-11-26: qty 40, 20d supply, fill #0

## 2020-11-26 NOTE — Telephone Encounter (Signed)
RN spoke with Jackelyn Poling at [facility], she states they received pull PICC orders for patient on 11/14/20.   Beryle Flock, RN

## 2020-11-26 NOTE — Progress Notes (Signed)
[facility] [phone]   [phone]  PROGRESS NOTE  Patient Care Team: Elige Radonhristian, Rylee, MD as PCP - General (Internal Medicine) Levert FeinsteinGranfortuna, James M, MD as Consulting Physician (Oncology) Newman Pieseoh, Su, MD as Consulting Physician (Otolaryngology) Lonie PeakSquire, Sarah, MD as Attending Physician (Radiation Oncology) Arthur HolmsZhao, Yan, MD (Inactive) as Consulting Physician (Hematology) Barrie Folkiehl, Richard A, RN (Inactive) as Oncology Nurse Navigator  Hematological/Oncological History #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 Left tonsil prominence with two Level II LN's (largest 2.2cm) and at least one Level IV LN (9mm);  no metastasisi ? Tonsil bx by Dr. Suszanne Connerseoh, invasive SCCa, p16+; not a candidate for TORS -Late 10/2018 - 12/2018: definitive chemoradiation with weekly cisplatin  ? 03/2019: end-of-treatment PET showed decrease in FDG avidity in the left tonsil but some residual soft tissue fullness, resolution of left LN disease   -2021:  ? Diffuse pharyngeal soft tissue thickening at the tongue base on multiple CT's  ? [facility], suggestive of radionecrosis; no evidence of malignancy   #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 -12/2018: acute DVT involving R peroneal vein and L gastrocnemius vein; new arterial thrombosis involving R common femoral artery and popliteal arteries, due to artery aneurysm -Late 12/2018: progression of acute DVT in the LLE from gastrocnemius vein to the femoral, popliteal, posterior tibial and peroneal veins despite being on Eliquis  -Currently on warfarin  11/25/2020: Developed a right upper extremity deep and superficial vein thromboses secondary to line placement.  Started on Lovenox 1 mg/kg every 12  hours.  Interval History:  [name] [name] 60 y.o. male with medical history significant for squamous cell carcinoma of left tonsil presents for a follow up visit. The patient's last visit was on 08/02/2020. In the interim since the last visit [name] [name] has developed a new right upper extremity DVT.  On exam today [name] [name] is accompanied by his significant other.  He reports that he received a midline for the delivery of antibiotics and then subsequently developed right upper extremity swelling, pain, and erythema.  Ultrasound performed on 11/25/2020 confirmed presence of both deep and superficial thromboses.  He was discharged on emergency department recommendations to follow-up with hematology.  On exam today he notes that he has been compliant with his Coumadin though on review of records levels have been fluctuant.  He is not currently having any issues with bleeding, bruising, or dark stools.  He otherwise denies having any constipation, lethargy, or other concerning symptoms.  A full 10 point ROS is listed below.  MEDICAL HISTORY:  Past Medical History:  Diagnosis Date  . 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 Benign essential HTN 01/04/2012  . Cancer (HCC)    tonsilar  . Chronic anticoagulation 01/02/2013  . Dermatofibroma of forearm 01/02/2013   Left side  . Hyperlipidemia, mixed 01/04/2012  . 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 Primary hypercoagulable state (HCC) 10/01/2014  . Sinus bradycardia, chronic 01/04/2012  . Superficial thrombosis of lower extremity 05/02/2012    SURGICAL HISTORY: Past Surgical History:  Procedure Laterality Date  . DIRECT LARYNGOSCOPY Left 10/19/2018  Procedure: DIRECT LARYNGOSCOPY;  Surgeon: Leta Baptist, MD;  Location: [place];  Service: ENT;  Laterality: Left;  . IR  IMAGING GUIDED PORT INSERTION  11/04/2018  . IR REMOVAL TUN ACCESS W/ PORT W/O FL MOD SED  05/01/2019  . TONSILLECTOMY Left 10/19/2018   Procedure: BIOPSY OF LEFT TONSIL;  Surgeon: Leta Baptist, MD;  Location: [place];  Service: ENT;  Laterality: Left;    SOCIAL HISTORY: Social History   Socioeconomic History  . Marital status: Divorced    Spouse name: Not on file  . Number of children: 2  . Years of education: Not on file  . Highest education level: Not on file  Occupational History  . Not on file  Tobacco Use  . Smoking status: Light Tobacco Smoker    Packs/day: 0.50    Years: 30.00    Pack years: 15.00    Types: Cigarettes    Last attempt to quit: 10/19/2018    Years since quitting: 2.1  . Smokeless tobacco: Never Used  Vaping Use  . Vaping Use: Former  . Substances: Nicotine, Flavoring  . Devices: 1 cartridge q2-3 days, he tells me he is not using.   Substance and Sexual Activity  . Alcohol use: Yes    Alcohol/week: 13.0 - 14.0 standard drinks    Types: 12 Cans of beer, 1 - 2 Shots of liquor per week    Comment: 1-2 times per week.  . Drug use: No  . Sexual activity: Not on file  Other Topics Concern  . Not on file  Social History Narrative  . Not on file   Social Determinants of Health   Financial Resource Strain: Not on file  Food Insecurity: Not on file  Transportation Needs: Not on file  Physical Activity: Not on file  Stress: Not on file  Social Connections: Not on file  Intimate Partner Violence: Not on file    FAMILY HISTORY: Family History  Problem Relation Age of Onset  . Stroke Father     ALLERGIES:  has No Known Allergies.  MEDICATIONS:  Current Outpatient Medications  Medication Sig Dispense Refill  . cyclobenzaprine (FLEXERIL) 10 MG tablet TAKE 1 TABLET BY MOUTH THREE TIMES A DAY AS NEEDED FOR MUSCLE SPASMS 30 tablet 0  . enoxaparin (LOVENOX) 100 MG/ML injection Inject 1 mL (100 mg total) into the skin every 12 (twelve) hours.  80 mL 0  . gabapentin (NEURONTIN) 100 MG capsule Take 200 mg by mouth every 8 (eight) hours as needed.    Marland Kitchen glycopyrrolate (ROBINUL) 1 MG tablet Take 1-2 tablet by mouth every eight hours as needed take 1-2 tabs every eight hours as needed for secretions    . ibuprofen (ADVIL) 200 MG tablet Take 600 mg by mouth every 6 (six) hours as needed for pain or fever.     . levothyroxine (SYNTHROID) 25 MCG tablet Take 1 tablet (25 mcg total) by mouth daily before breakfast. Take on empty stomach w/ water, 1 hour before other meds or food. 30 tablet 5  . metoprolol succinate (TOPROL-XL) 25 MG 24 hr tablet TAKE 1 TABLET BY MOUTH EVERY DAY 90 tablet 1  . oxyCODONE (OXY IR/ROXICODONE) 5 MG immediate release tablet Take 5 mg by mouth every 8 (eight) hours as needed.    . pentoxifylline (TRENTAL) 400 MG CR tablet Take by mouth.    . sodium fluoride (PREVIDENT 5000 PLUS) 1.1 % CREA dental cream Apply to tooth brush. Brush teeth for 2  minutes. Spit out excess. DO NOT rinse afterwards. Repeat nightly. 1 Tube prn  . vitamin E 180 MG (400 UNITS) capsule Take 400 IU daily x 1 week, then 400 IU BID 60 capsule 5  . warfarin (COUMADIN) 10 MG tablet Take 1 tablet of 10 mg warfarin daily except on Sundays, Thursdays and Saturdays, take only 1/2 tablet (5 mg). 72 tablet 2  . atorvastatin (LIPITOR) 20 MG tablet Take 1 tablet (20 mg total) by mouth daily. 30 tablet 11   No current facility-administered medications for this visit.    REVIEW OF SYSTEMS:   Constitutional: ( - ) fevers, ( - )  chills , ( - ) night sweats Eyes: ( - ) blurriness of vision, ( - ) double vision, ( - ) watery eyes Ears, nose, mouth, throat, and face: ( - ) mucositis, ( - ) sore throat Respiratory: ( - ) cough, ( - ) dyspnea, ( - ) wheezes Cardiovascular: ( - ) palpitation, ( - ) chest discomfort, ( - ) lower extremity swelling Gastrointestinal:  ( - ) nausea, ( - ) heartburn, ( - ) change in bowel habits Skin: ( - ) abnormal skin  rashes Lymphatics: ( - ) new lymphadenopathy, ( - ) easy bruising Neurological: ( - ) numbness, ( - ) tingling, ( - ) new weaknesses Behavioral/Psych: ( - ) mood change, ( - ) new changes  All other systems were reviewed with the patient and are negative.  PHYSICAL EXAMINATION: ECOG PERFORMANCE STATUS: 1 - Symptomatic but completely ambulatory  Vitals:   11/26/20 1342  BP: (!) 123/97  Pulse: (!) 129  Resp: 19  Temp: (!) 97.3 F (36.3 C)  SpO2: 96%   Filed Weights   11/26/20 1342  Weight: 222 lb 11.2 oz (101 kg)    GENERAL: well appearing middle aged Caucasian male alert, no distress and comfortable SKIN: skin color, texture, turgor are normal, no rashes or significant lesions EYES: conjunctiva are pink and non-injected, sclera clear OROPHARYNX: exam limited as patient cannot open his mouth wide. Thick salivations noted  LUNGS: clear to auscultation and percussion with normal breathing effort HEART: regular rate & rhythm and no murmurs and no lower extremity edema Musculoskeletal: Erythematous, swollen and tender right upper extremity. PSYCH: alert & oriented x 3, fluent speech NEURO: no focal motor/sensory deficits  LABORATORY DATA:  I have reviewed the data as listed CBC Latest Ref Rng & Units 11/25/2020 08/02/2020 04/15/2020  WBC 4.0 - 10.5 K/uL 7.1 6.1 5.7  Hemoglobin 13.0 - 17.0 g/dL 17.0 15.6 15.1  Hematocrit 39.0 - 52.0 % 49.5 46.5 44.2  Platelets 150 - 400 K/uL 159 182 192    CMP Latest Ref Rng & Units 11/25/2020 08/02/2020 04/15/2020  Glucose 70 - 99 mg/dL 106(H) 96 95  BUN 6 - 20 mg/dL 7 11 12   Creatinine 0.61 - 1.24 mg/dL 0.78 0.92 0.81  Sodium 135 - 145 mmol/L 136 141 139  Potassium 3.5 - 5.1 mmol/L 3.5 4.6 4.0  Chloride 98 - 111 mmol/L 105 104 105  CO2 22 - 32 mmol/L 23 29 28   Calcium 8.9 - 10.3 mg/dL 8.9 9.8 9.6  Total Protein 6.5 - 8.1 g/dL 6.7 7.4 7.0  Total Bilirubin 0.3 - 1.2 mg/dL 0.8 0.7 0.6  Alkaline Phos 38 - 126 U/L 73 100 109  AST 15 - 41 U/L 25 21  15   ALT 0 - 44 U/L 19 18 11     RADIOGRAPHIC STUDIES: UE VENOUS DUPLEX ([facility] & [facility]  7 am - 7 pm)  Result Date: 11/25/2020 UPPER VENOUS STUDY  Patient Name:  [name] [name]  Date of Exam:   11/25/2020 Medical Rec #: [id number]       Accession #:    [id number] Date of Birth: 07-20-1961       Patient Gender: M Patient Age:   59Y Exam Location:  [facility] Procedure:      VAS [place] UPPER EXTREMITY VENOUS DUPLEX Referring Phys: [id number] LAURA A MURPHY --------------------------------------------------------------------------------  Indications: Edema, and Erythema Comparison Study: No prior study Performing Technologist: Blanch Media RVS Supporting Technologist: Gertie Fey MHA, RDMS, RVT, RDCS  Examination Guidelines: A complete evaluation includes B-mode imaging, spectral Doppler, color Doppler, and power Doppler as needed of all accessible portions of each vessel. Bilateral testing is considered an integral part of a complete examination. Limited examinations for reoccurring indications may be performed as noted.  Right Findings: +----------+------------+---------+-----------+----------+-------+ RIGHT     CompressiblePhasicitySpontaneousPropertiesSummary +----------+------------+---------+-----------+----------+-------+ IJV           Full       Yes       Yes                      +----------+------------+---------+-----------+----------+-------+ Subclavian                         No               Chronic +----------+------------+---------+-----------+----------+-------+ Axillary      None                 No                Acute  +----------+------------+---------+-----------+----------+-------+ Brachial      None                 No                Acute  +----------+------------+---------+-----------+----------+-------+ Radial        Full                                          +----------+------------+---------+-----------+----------+-------+ Ulnar         Full                                           +----------+------------+---------+-----------+----------+-------+ Cephalic      None                 No                Acute  +----------+------------+---------+-----------+----------+-------+ Basilic       None                 No                Acute  +----------+------------+---------+-----------+----------+-------+  Left Findings: +----------+------------+---------+-----------+----------+-------+ LEFT      CompressiblePhasicitySpontaneousPropertiesSummary +----------+------------+---------+-----------+----------+-------+ Subclavian               Yes       Yes                      +----------+------------+---------+-----------+----------+-------+  Summary:  Right: Findings consistent with acute deep vein thrombosis involving the right axillary  vein and right brachial veins. Findings consistent with acute superficial vein thrombosis involving the right basilic vein and right cephalic vein. 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  Left: No evidence of thrombosis in the subclavian.  *See table(s) above for measurements and observations.    Preliminary     ASSESSMENT & PLAN [name] [name] 60 y.o. male with medical history significant for squamous cell carcinoma of left tonsil presents for a follow up visit.  After review the labs, discussion with the patient, and review of the prior records the patient's findings are most consistent with squamous cell carcinoma of the left tonsil status post definitive treatment with chemoradiation.  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  The patient underwent hyperbaric oxygen therapy for this lesion.  Previously I stressed the importance of continuing routine follow-up with ENT/RadOnc for direct visualization of his malignancy site.  We will continue to monitor and help with  symptom management during this posttreatment surveillance period.   On exam today Mr. Deguire has a new line associated DVT and superficial vein thromboses.  He is still on his Coumadin therapy.  Given this new clot that was provoked we would recommend Lovenox 1 mg/kg every 12 hours at therapeutic dosing.  The recommendation will be for 3 full months of anticoagulation.  Given the fluctuant levels of his Coumadin I would recommend we continue Lovenox with consideration of transitioning to a different agent such as Xarelto.  He has failed Eliquis before in the past.  Plan see the patient back in 1 month's time to discuss further plans for anticoagulation moving forward.  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   #Neck Abscess, resolved --Encourage follow-up with ENT   #Hypothyroidisim, radiation induced --current on levothyroxine 26 mcg PO daily.   #Recurrent LLE DVT #Acute Right Upper Extremity DVT/SVT --patient had a midline placed for abx, developed a subsequent RUE DVT --this is considered provoked in the setting of a catheter, though patient is prone to clotting. --recommend x 3 months of therapeutic 1mg /kg lovenox. HOLD coumadin. --f/u visit in 1 month to discuss alternative anticoagulation (Xarelto vs continued lovenox or coumadin) - Continue follow-up with anticoagulation clinic  - Duration of  anticoagulation is lifelong   #Persistent oral candidiasis -Despite aggressive antifungal therapy, including amphotericin, there was no significant improvement in oral candidiasis -Infectious disease felt that the oral candidiasis was noninfectious, and therefore does not warrant any further treatment at this time -I counseled the patient on the importance of maintaining good oral hygiene            No orders of the defined types were placed in this encounter.   All questions were answered. The patient knows to call the clinic with any problems, questions or concerns.  A total of more than  30 minutes were spent on this encounter and over half of that time was spent on counseling and coordination of care as outlined above.   Ledell Peoples, MD Department of Hematology/Oncology [place] [facility] Phone: [phone] Pager: [phone] Email: [email or web address]  11/26/2020 2:06 PM

## 2020-11-26 NOTE — Telephone Encounter (Signed)
ED visit reviewed.  [name] [name] developed a blood clot in his arm while on therapeutic coumadin.  The ED provider spoke to the hematologist/oncologist on call for Dr. Lorenso Courier and discussed plan - recommendation was to make no changes in medication at this time but to have [name] f/u closely with his H/O doctor, Dr. Lorenso Courier.  I would assume this means that [name] should call to move up his established appt scheduled for June. I hope that is helpful.  Our clinic would also be deferring recommendations for treatment to the H/O doctors as this is an unusual situation.

## 2020-11-26 NOTE — Telephone Encounter (Signed)
RTC to patient, Dr. Jimmye Norman instructions relayed to patient, he verbalized understanding. SChaplin, RN,BSN

## 2020-11-26 NOTE — Telephone Encounter (Signed)
Pt is requesting a call back he stated that he went to the Ed 11/25/20 for pain in his arm .. pt stated that he was told he has blood clots .. pt stated that he was not given anything for the issue and he is wanting to know what to do

## 2020-11-26 NOTE — Telephone Encounter (Signed)
From my review of the ED provider note, Mr. [name] [name] was advised in ED to f/u soon with Dr. Lorenso Courier (I assume this means he should call to schedule an ED f/u appt).  ED doctor communicated yesterday with a provider on the oncology/hematology team and plan was to maintain current therapy.  The blood clot occurred while on therapeutic coumadin which is a complicated situation for which their expertise is needed.

## 2020-11-26 NOTE — Telephone Encounter (Signed)
Forwarding to [facility] team and attendings to review ED notes and advise on any recommendations or f/u appt's please. Thank you, SChaplin, RN,BSN

## 2020-11-26 NOTE — Telephone Encounter (Signed)
Called pt to schedule appts per 5/10 sch msg. Pt was unsure why he needed appts and requested to speak to the nurse before anything was schedule. Transferred pt to desk nurse.

## 2020-11-26 NOTE — Telephone Encounter (Signed)
Transition Care Management Unsuccessful Follow-up Telephone Call  Date of discharge and from where:  11/25/2020 [facility]  Attempts:  1st Attempt  Reason for unsuccessful TCM follow-up call:  Left voice message

## 2020-11-27 ENCOUNTER — Telehealth: Payer: Self-pay | Admitting: Hematology and Oncology

## 2020-11-27 NOTE — Telephone Encounter (Signed)
Transition Care Management Follow-up Telephone Call  Date of discharge and from where: 11/25/2020 - [facility]  How have you been since you were released from the hospital? "Doing good"  Any questions or concerns? No  Items Reviewed:  Did the pt receive and understand the discharge instructions provided? Yes   Medications obtained and verified? Yes   Other? No   Any new allergies since your discharge? No   Dietary orders reviewed? No  Do you have support at home? Yes    Functional Questionnaire: (I = Independent and D = Dependent) ADLs: I  Bathing/Dressing- I  Meal Prep- I  Eating- I  Maintaining continence- I  Transferring/Ambulation- I  Managing Meds- I  Follow up appointments reviewed:   PCP Hospital f/u appt confirmed? No    Specialist Hospital f/u appt confirmed? No    Are transportation arrangements needed? N/A  If their condition worsens, is the pt aware to call PCP or go to the Emergency Dept.? Yes  Was the patient provided with contact information for the PCP's office or ED? Yes  Was to pt encouraged to call back with questions or concerns? Yes

## 2020-11-27 NOTE — Telephone Encounter (Signed)
Scheduled per los. Called and left msg. Mailed printout  °

## 2020-11-28 ENCOUNTER — Other Ambulatory Visit (HOSPITAL_COMMUNITY): Payer: Self-pay

## 2020-11-30 LAB — CULTURE, BLOOD (ROUTINE X 2): Culture: NO GROWTH

## 2020-12-05 ENCOUNTER — Other Ambulatory Visit (HOSPITAL_COMMUNITY): Payer: Self-pay

## 2020-12-06 ENCOUNTER — Other Ambulatory Visit (HOSPITAL_COMMUNITY): Payer: Self-pay

## 2020-12-18 ENCOUNTER — Telehealth: Payer: Self-pay | Admitting: Adult Health Nurse Practitioner

## 2020-12-18 NOTE — Telephone Encounter (Signed)
Returned significant others VM about patient needing refill on his oxycodone. Informed that escript sent to [facility] in [facility] for oxycodone 5 mg Q 8 hours PRN for pain.  Checked [place] substance abuse data base with no concerns. Mechel Haggard K. Olena Heckle NP

## 2020-12-23 ENCOUNTER — Other Ambulatory Visit (HOSPITAL_COMMUNITY): Payer: Self-pay

## 2020-12-23 ENCOUNTER — Other Ambulatory Visit: Payer: Self-pay | Admitting: Hematology and Oncology

## 2020-12-23 MED ORDER — ENOXAPARIN SODIUM 100 MG/ML IJ SOSY
100.0000 mg | PREFILLED_SYRINGE | Freq: Two times a day (BID) | INTRAMUSCULAR | 0 refills | Status: DC
  Filled 2020-12-23: qty 60, 30d supply, fill #0
  Filled 2021-01-29: qty 60, 30d supply, fill #1

## 2020-12-24 ENCOUNTER — Other Ambulatory Visit (HOSPITAL_COMMUNITY): Payer: Self-pay

## 2020-12-25 ENCOUNTER — Other Ambulatory Visit: Payer: Medicaid Other

## 2020-12-25 ENCOUNTER — Other Ambulatory Visit (HOSPITAL_COMMUNITY): Payer: Self-pay

## 2020-12-25 ENCOUNTER — Telehealth: Payer: Self-pay | Admitting: Physician Assistant

## 2020-12-25 ENCOUNTER — Ambulatory Visit: Payer: Medicaid Other | Admitting: Physician Assistant

## 2020-12-25 NOTE — Telephone Encounter (Signed)
Scheduled appointment per provider. Patient is aware. 

## 2020-12-27 ENCOUNTER — Other Ambulatory Visit (HOSPITAL_COMMUNITY): Payer: Self-pay

## 2021-01-03 ENCOUNTER — Other Ambulatory Visit: Payer: Self-pay

## 2021-01-03 ENCOUNTER — Other Ambulatory Visit: Payer: Self-pay | Admitting: Physician Assistant

## 2021-01-03 ENCOUNTER — Inpatient Hospital Stay: Payer: Medicaid Other | Attending: Physician Assistant

## 2021-01-03 ENCOUNTER — Inpatient Hospital Stay: Payer: Medicaid Other | Admitting: Physician Assistant

## 2021-01-03 VITALS — BP 119/83 | HR 83 | Temp 97.6°F | Resp 18 | Ht 75.0 in | Wt 230.1 lb

## 2021-01-03 DIAGNOSIS — B370 Candidal stomatitis: Secondary | ICD-10-CM

## 2021-01-03 DIAGNOSIS — E890 Postprocedural hypothyroidism: Secondary | ICD-10-CM | POA: Diagnosis not present

## 2021-01-03 DIAGNOSIS — C099 Malignant neoplasm of tonsil, unspecified: Secondary | ICD-10-CM | POA: Diagnosis not present

## 2021-01-03 DIAGNOSIS — I82621 Acute embolism and thrombosis of deep veins of right upper extremity: Secondary | ICD-10-CM | POA: Diagnosis not present

## 2021-01-03 DIAGNOSIS — I825Y1 Chronic embolism and thrombosis of unspecified deep veins of right proximal lower extremity: Secondary | ICD-10-CM

## 2021-01-03 DIAGNOSIS — Z86718 Personal history of other venous thrombosis and embolism: Secondary | ICD-10-CM | POA: Diagnosis not present

## 2021-01-03 DIAGNOSIS — Z1329 Encounter for screening for other suspected endocrine disorder: Secondary | ICD-10-CM

## 2021-01-03 DIAGNOSIS — Z7901 Long term (current) use of anticoagulants: Secondary | ICD-10-CM | POA: Insufficient documentation

## 2021-01-03 DIAGNOSIS — F17210 Nicotine dependence, cigarettes, uncomplicated: Secondary | ICD-10-CM | POA: Insufficient documentation

## 2021-01-03 DIAGNOSIS — I82409 Acute embolism and thrombosis of unspecified deep veins of unspecified lower extremity: Secondary | ICD-10-CM

## 2021-01-03 LAB — CMP (CANCER CENTER ONLY)
ALT: 38 U/L (ref 0–44)
AST: 36 U/L (ref 15–41)
Albumin: 4 g/dL (ref 3.5–5.0)
Alkaline Phosphatase: 78 U/L (ref 38–126)
Anion gap: 11 (ref 5–15)
BUN: 9 mg/dL (ref 6–20)
CO2: 24 mmol/L (ref 22–32)
Calcium: 10 mg/dL (ref 8.9–10.3)
Chloride: 104 mmol/L (ref 98–111)
Creatinine: 1.04 mg/dL (ref 0.61–1.24)
GFR, Estimated: >60 mL/min (ref >60)
Glucose, Bld: 100 mg/dL — ABNORMAL HIGH (ref 70–99)
Potassium: 4.2 mmol/L (ref 3.5–5.1)
Sodium: 139 mmol/L (ref 135–145)
Total Bilirubin: 0.6 mg/dL (ref 0.3–1.2)
Total Protein: 7 g/dL (ref 6.5–8.1)

## 2021-01-03 LAB — CBC WITH DIFFERENTIAL (CANCER CENTER ONLY)
Abs Immature Granulocytes: 0.01 10*3/uL (ref 0.00–0.07)
Basophils Absolute: 0 10*3/uL (ref 0.0–0.1)
Basophils Relative: 1 %
Eosinophils Absolute: 0.2 10*3/uL (ref 0.0–0.5)
Eosinophils Relative: 4 %
HCT: 46.3 % (ref 39.0–52.0)
Hemoglobin: 16 g/dL (ref 13.0–17.0)
Immature Granulocytes: 0 %
Lymphocytes Relative: 22 %
Lymphs Abs: 1.2 10*3/uL (ref 0.7–4.0)
MCH: 33.9 pg (ref 26.0–34.0)
MCHC: 34.6 g/dL (ref 30.0–36.0)
MCV: 98.1 fL (ref 80.0–100.0)
Monocytes Absolute: 0.7 10*3/uL (ref 0.1–1.0)
Monocytes Relative: 13 %
Neutro Abs: 3.1 10*3/uL (ref 1.7–7.7)
Neutrophils Relative %: 60 %
Platelet Count: 152 10*3/uL (ref 150–400)
RBC: 4.72 MIL/uL (ref 4.22–5.81)
RDW: 14.2 % (ref 11.5–15.5)
WBC Count: 5.2 10*3/uL (ref 4.0–10.5)
nRBC: 0 % (ref 0.0–0.2)

## 2021-01-03 LAB — T4, FREE: Free T4: 0.8 ng/dL (ref 0.61–1.12)

## 2021-01-03 LAB — TSH: TSH: 11.979 u[IU]/mL — ABNORMAL HIGH (ref 0.350–4.500)

## 2021-01-03 MED ORDER — CYCLOBENZAPRINE HCL 10 MG PO TABS: ORAL_TABLET | ORAL | 0 refills | Status: DC

## 2021-01-04 ENCOUNTER — Encounter: Payer: Self-pay | Admitting: Hematology

## 2021-01-04 NOTE — Progress Notes (Signed)
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 Benjamine Mola, invasive SCCa, p16+; not a candidate for TORS -Late 10/2018 - 12/2018: definitive chemoradiation with weekly cisplatin  03/2019: end-of-treatment PET showed decrease in FDG avidity in the left tonsil but some residual soft tissue fullness, resolution of left LN disease   -2021:  Diffuse pharyngeal soft tissue thickening at the tongue base on multiple CT's  Necrotic tissue on multiple biopsies at [facility], suggestive of radionecrosis; no evidence of malignancy    #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 -12/2018: acute DVT involving R peroneal vein and L gastrocnemius vein; new arterial thrombosis involving R common femoral artery and popliteal arteries, due to artery aneurysm -Late 12/2018: progression of acute DVT in the LLE from gastrocnemius vein to the femoral, popliteal, posterior tibial and peroneal veins despite being on Eliquis  -Currently on warfarin  11/25/2020: Developed a right upper extremity deep and superficial vein thromboses secondary to line placement.  Started on Lovenox 1 mg/kg every 12  hours.  Interval History:  [name] [name] 60 y.o. male reports that improvement of swelling in his right upper extremity since starting Lovenox injections last month. He denies any pain in the affected extremity. He reports that his energy levels and appetite are stable. He is able to complete his ADLs on his own. He continues to have an open wound with drainage in the left mandible to the skin. He is no longer on antibiotics. He is not currently having any issues with bleeding, bruising, or dark stools.  He denies any nausea, vomiting or abdominal pain. His bowel movements are regular. He denies any fevers, chills, night sweats, shortness of breath, chest pain or cough. He has no other complaints. A full 10 point ROS is listed below.  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   Right SFA 05/07/17 while on warfarin INR 2.9   Benign essential HTN 01/04/2012   Cancer ([place])    tonsilar   Chronic anticoagulation 01/02/2013   Dermatofibroma of forearm 01/02/2013   Left side   Hyperlipidemia, mixed 01/04/2012   Polycythemia secondary to smoking 01/04/2012   Primary hypercoagulable state ([place]) 10/01/2014   Sinus bradycardia, chronic 01/04/2012   Superficial thrombosis of lower extremity 05/02/2012    SURGICAL HISTORY: Past Surgical History:  Procedure Laterality Date   DIRECT LARYNGOSCOPY Left 10/19/2018   Procedure: DIRECT LARYNGOSCOPY;  Surgeon: Leta Baptist, MD;  Location: [facility];  Service: ENT;  Laterality: Left;   IR IMAGING GUIDED PORT INSERTION  11/04/2018   IR REMOVAL TUN ACCESS W/ PORT W/O FL MOD SED  05/01/2019  TONSILLECTOMY Left 10/19/2018   Procedure: BIOPSY OF LEFT TONSIL;  Surgeon: Leta Baptist, MD;  Location: [facility];  Service: ENT;  Laterality: Left;    SOCIAL HISTORY: Social History    Socioeconomic History   Marital status: Divorced    Spouse name: Not on file   Number of children: 2   Years of education: Not on file   Highest education level: Not on file  Occupational History   Not on file  Tobacco Use   Smoking status: Light Smoker    Packs/day: 0.50    Years: 30.00    Pack years: 15.00    Types: Cigarettes    Last attempt to quit: 10/19/2018    Years since quitting: 2.2   Smokeless tobacco: Never  Vaping Use   Vaping Use: Former   Substances: Nicotine, Flavoring   Devices: 1 cartridge q2-3 days, he tells me he is not using.   Substance and Sexual Activity   Alcohol use: Yes    Alcohol/week: 13.0 - 14.0 standard drinks    Types: 12 Cans of beer, 1 - 2 Shots of liquor per week    Comment: 1-2 times per week.   Drug use: No   Sexual activity: Not on file  Other Topics Concern   Not on file  Social History Narrative   Not on file   Social Determinants of Health   Financial Resource Strain: Not on file  Food Insecurity: Not on file  Transportation Needs: Not on file  Physical Activity: Not on file  Stress: Not on file  Social Connections: Not on file  Intimate Partner Violence: Not on file    FAMILY HISTORY: Family History  Problem Relation Age of Onset   Stroke Father     ALLERGIES:  has No Known Allergies.  MEDICATIONS:  Current Outpatient Medications  Medication Sig Dispense Refill   atorvastatin (LIPITOR) 20 MG tablet Take 1 tablet (20 mg total) by mouth daily. 30 tablet 11   cyclobenzaprine (FLEXERIL) 10 MG tablet TAKE 1 TABLET BY MOUTH THREE TIMES A DAY AS NEEDED FOR MUSCLE SPASMS 30 tablet 0   enoxaparin (LOVENOX) 100 MG/ML injection Inject 1 mL (100 mg total) into the skin every 12 (twelve) hours. 60 mL 1   enoxaparin (LOVENOX) 100 MG/ML injection Inject 1 mL (100 mg total) into the skin every 12 (twelve) hours. 80 mL 0   gabapentin (NEURONTIN) 100 MG capsule Take 200 mg by mouth every 8 (eight) hours as needed.      glycopyrrolate (ROBINUL) 1 MG tablet Take 1-2 tablet by mouth every eight hours as needed take 1-2 tabs every eight hours as needed for secretions     ibuprofen (ADVIL) 200 MG tablet Take 600 mg by mouth every 6 (six) hours as needed for pain or fever.      levothyroxine (SYNTHROID) 25 MCG tablet Take 1 tablet (25 mcg total) by mouth daily before breakfast. Take on empty stomach w/ water, 1 hour before other meds or food. 30 tablet 5   metoprolol succinate (TOPROL-XL) 25 MG 24 hr tablet TAKE 1 TABLET BY MOUTH EVERY DAY 90 tablet 1   oxyCODONE (OXY IR/ROXICODONE) 5 MG immediate release tablet Take 5 mg by mouth every 8 (eight) hours as needed.     pentoxifylline (TRENTAL) 400 MG CR tablet Take by mouth.     sodium fluoride (PREVIDENT 5000 PLUS) 1.1 % CREA dental cream Apply to tooth brush. Brush teeth for 2 minutes. Spit out excess. DO  NOT rinse afterwards. Repeat nightly. 1 Tube prn   vitamin E 180 MG (400 UNITS) capsule Take 400 IU daily x 1 week, then 400 IU BID 60 capsule 5   No current facility-administered medications for this visit.    REVIEW OF SYSTEMS:   Constitutional: ( - ) fevers, ( - )  chills , ( - ) night sweats Eyes: ( - ) blurriness of vision, ( - ) double vision, ( - ) watery eyes Ears, nose, mouth, throat, and face: ( - ) mucositis, ( - ) sore throat Respiratory: ( - ) cough, ( - ) dyspnea, ( - ) wheezes Cardiovascular: ( - ) palpitation, ( - ) chest discomfort, ( - ) lower extremity swelling Gastrointestinal:  ( - ) nausea, ( - ) heartburn, ( - ) change in bowel habits Skin: ( - ) abnormal skin rashes Lymphatics: ( - ) new lymphadenopathy, ( - ) easy bruising Neurological: ( - ) numbness, ( - ) tingling, ( - ) new weaknesses Behavioral/Psych: ( - ) mood change, ( - ) new changes  All other systems were reviewed with the patient and are negative.  PHYSICAL EXAMINATION: ECOG PERFORMANCE STATUS: 1 - Symptomatic but completely ambulatory  Vitals:   01/03/21 1533  BP:  119/83  Pulse: 83  Resp: 18  Temp: 97.6 F (36.4 C)  SpO2: 95%   Filed Weights   01/03/21 1533  Weight: 230 lb 1.6 oz (104.4 kg)    GENERAL: well appearing middle aged Caucasian male alert, no distress and comfortable SKIN: skin color, texture, turgor are normal, no rashes or significant lesions EYES: conjunctiva are pink and non-injected, sclera clear OROPHARYNX: exam limited as patient cannot open his mouth wide. Thick salivations noted  LUNGS: clear to auscultation and percussion with normal breathing effort HEART: regular rate & rhythm and no murmurs and no lower extremity edema Musculoskeletal: Mild edema involving the right upper extremity. PSYCH: alert & oriented x 3, fluent speech NEURO: no focal motor/sensory deficits  LABORATORY DATA:  I have reviewed the data as listed CBC Latest Ref Rng & Units 01/03/2021 11/25/2020 08/02/2020  WBC 4.0 - 10.5 K/uL 5.2 7.1 6.1  Hemoglobin 13.0 - 17.0 g/dL 16.0 17.0 15.6  Hematocrit 39.0 - 52.0 % 46.3 49.5 46.5  Platelets 150 - 400 K/uL 152 159 182    CMP Latest Ref Rng & Units 01/03/2021 11/25/2020 08/02/2020  Glucose 70 - 99 mg/dL 100(H) 106(H) 96  BUN 6 - 20 mg/dL 9 7 11   Creatinine 0.61 - 1.24 mg/dL 1.04 0.78 0.92  Sodium 135 - 145 mmol/L 139 136 141  Potassium 3.5 - 5.1 mmol/L 4.2 3.5 4.6  Chloride 98 - 111 mmol/L 104 105 104  CO2 22 - 32 mmol/L 24 23 29   Calcium 8.9 - 10.3 mg/dL 10.0 8.9 9.8  Total Protein 6.5 - 8.1 g/dL 7.0 6.7 7.4  Total Bilirubin 0.3 - 1.2 mg/dL 0.6 0.8 0.7  Alkaline Phos 38 - 126 U/L 78 73 100  AST 15 - 41 U/L 36 25 21  ALT 0 - 44 U/L 38 19 18    RADIOGRAPHIC STUDIES: No results found.   ASSESSMENT & PLAN [name] [name] 60 y.o. male with medical history significant for squamous cell carcinoma of left tonsil presents for a follow up visit.    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  Lennox Laity, ENT at [facility] -Multiple discussions were held with radiation oncology and ENT at [facility].  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 Patient is going to [place] next week for a second opinion.  -RTC in August 2022 for a follow up.   #Neck Abscess, resolved --Encourage follow-up with ENT   #Hypothyroidisim, radiation induced --current on levothyroxine 26 mcg PO daily.  --TSH is 11.979 (H) and T4 is 0.80.  --Recommend to continue current dose of levothyroxine.   #Recurrent LLE DVT #Acute Right Upper Extremity DVT/SVT --patient had a midline placed for abx, developed a subsequent RUE DVT --this is considered provoked in the setting of a catheter, though patient is prone to clotting. --Currently on therapeutic 1mg /kg lovenox x 3 months since May 2022. HOLD coumadin. --Continue follow-up with anticoagulation clinic  --Duration of anticoagulation is lifelong  --Patient will return in August 2022 to discuss alternative anticoagulation  (Xarelto vs continued lovenox or coumadin)   #Persistent oral candidiasis -Despite aggressive antifungal therapy, including amphotericin, there was no significant improvement in oral candidiasis -Infectious disease felt that the oral candidiasis was noninfectious, and therefore does not warrant any further treatment at this time -I counseled the patient on the importance of maintaining good oral hygiene            No orders of the defined types were placed in this  encounter.   All questions were answered. The patient knows to call the clinic with any problems, questions or concerns.  I have spent a total of 25 minutes minutes of face-to-face and non-face-to-face time, preparing to see the patient, obtaining and/or reviewing separately obtained history, performing a medically appropriate examination, counseling and educating the patient, documenting clinical information in the electronic health record, and care coordination.   Dede Query PA-C Department of Hematology/Oncology [place] at [facility] Phone: [phone]

## 2021-01-07 ENCOUNTER — Telehealth: Payer: Self-pay | Admitting: Adult Health Nurse Practitioner

## 2021-01-07 NOTE — Telephone Encounter (Signed)
Called to set up follow up appointment.  Scheduled visit for January 15, 2021 at 11:30am Harlow Basley K. Olena Heckle NP

## 2021-01-10 DIAGNOSIS — C099 Malignant neoplasm of tonsil, unspecified: Secondary | ICD-10-CM | POA: Diagnosis not present

## 2021-01-10 DIAGNOSIS — Y842 Radiological procedure and radiotherapy as the cause of abnormal reaction of the patient, or of later complication, without mention of misadventure at the time of the procedure: Secondary | ICD-10-CM | POA: Diagnosis not present

## 2021-01-10 DIAGNOSIS — M272 Inflammatory conditions of jaws: Secondary | ICD-10-CM | POA: Diagnosis not present

## 2021-01-13 ENCOUNTER — Other Ambulatory Visit: Payer: Medicaid Other

## 2021-01-13 ENCOUNTER — Ambulatory Visit: Payer: Medicaid Other | Admitting: Hematology and Oncology

## 2021-01-15 ENCOUNTER — Other Ambulatory Visit: Payer: Medicaid Other | Admitting: Adult Health Nurse Practitioner

## 2021-01-15 ENCOUNTER — Encounter: Payer: Self-pay | Admitting: Adult Health Nurse Practitioner

## 2021-01-15 ENCOUNTER — Other Ambulatory Visit: Payer: Self-pay

## 2021-01-15 DIAGNOSIS — F419 Anxiety disorder, unspecified: Secondary | ICD-10-CM

## 2021-01-15 DIAGNOSIS — M869 Osteomyelitis, unspecified: Secondary | ICD-10-CM | POA: Diagnosis not present

## 2021-01-15 DIAGNOSIS — Z515 Encounter for palliative care: Secondary | ICD-10-CM | POA: Diagnosis not present

## 2021-01-15 NOTE — Progress Notes (Signed)
[facility] Consult Note Telephone: [phone]  Fax: [phone]    Date of encounter: 01/15/21 PATIENT NAME: [name] [name] [id number]   [phone] (home)  DOB: [birth year] MRN: [id number] PRIMARY CARE PROVIDER:    [name], MD,  [address] [place] [address] [phone]  REFERRING PROVIDER:   [name], MD [address] [place],  [place] [address] [phone]  RESPONSIBLE PARTY:    Contact Information     Name Relation Home Work Mobile   [place] Significant other [place] Daughter   [phone]        I met face to face with patient in home. Palliative Care was asked to follow this patient by consultation request of  [name], MD to address advance care planning and complex medical decision making. This is a follow up visit.                                   ASSESSMENT AND PLAN / RECOMMENDATIONS:   Advance Care Planning/Goals of Care: Goals include to maximize quality of life and symptom management.  CODE STATUS: Full code  Symptom Management/Plan:  Osteomyelitis of left jaw: Patient apprehensive about surgery and pursuing second opinion.  Has scheduled CT scans with [facility] and upcoming appointments for further discussion.  Encouraged to ask questions about the surgery and to request any information that can be given to him about the surgery to help him gain better understanding and hopefully become less apprehensive about the surgery.  Patient needing refill on his pain medications.  Checked [place] substance abuse database with no concerns.  Sent in refill for oxycodone 5 mg every 8 hours as needed for pain 30-day supply and gabapentin 100 mg every 8 hours as needed for pain 30-day supply with [facility] [place]  Anxiety: Patient having increased anxiety due to medical situation at this time and not  sleeping well.  Have sent in short-term prescription for Ativan 0.5 mg twice daily as needed for anxiety for 14 days.   Follow up Palliative Care Visit: Palliative care will continue to follow for complex medical decision making, advance care planning, and clarification of goals. Will have office call to set up follow up visit in about 8-10 weeks.  Encouraged to call with any questions or concerns  I spent 60 minutes providing this consultation. More than 50% of the time in this consultation was spent in counseling and care coordination.   PPS: 50%  HOSPICE ELIGIBILITY/DIAGNOSIS: TBD  Chief Complaint: follow up palliative visit  HISTORY OF PRESENT ILLNESS:  [name] [name] is a 60 y.o. year old male  with tonsillar cancer,HTN, HLD, h/o DVT and PE on coumadin .  Patient was seen in ED on 11/25/2020 for DVT of right upper arm after PICC line was removed.  He continues with his home Coumadin and Lovenox shots twice daily for at least another month.  For at least another month.  States that he still gets oozing from the left jaw osteomyelitis.  Has been seen at [facility] for second opinion on surgery for this.  Has a CT scan scheduled for tomorrow.  Patient has been apprehensive about the surgery and feels like he does not have a clear picture of what is going on.  Have encouraged him to  ask as many questions as he needs to get a better understanding.  Oncologist with [facility] did tell him he was not overly familiar with the particular type of surgery being proposed but did suggest that he go through with the surgery.  Patient states that he has been having a lot of anxiety related to his medical status right now and has not been sleeping well for the past 2 months.  Has tried melatonin and Benadryl to help sleep with no relief.  He continues to have pain in the jaw related to his osteomyelitis and jaw fracture.  He does get relief with oxycodone 5 mg every 8 hours as needed along with gabapentin 100 mg every 8 hours  as needed.  History obtained from review of EMR and interview with  [name] [name].  I reviewed available labs, medications, imaging, studies and related documents from the EMR.  Records reviewed and summarized above.   PHYSICAL EXAM:   General: patient in NAD Eyes: sclera anicteric and noninjected with no discharge noted Cardiovascular: no edema noted to lower extremities; swelling of vein noted to posterior side of left arm from DVT Pulmonary: normal respiratory effort Extremities: no joint deformities Skin: no rashes on exposed skin Neurological: A&O x3   Thank you for the opportunity to participate in the care of [name] [name].  The palliative care team will continue to follow. Please call our office at [phone] if we can be of additional assistance.   Miaisabella Bacorn Jenetta Downer, NP , DNP  This chart was dictated using voice recognition software.  Despite best efforts to proofread,  errors can occur which can change the documentation meaning.   COVID-19 PATIENT SCREENING TOOL Asked and negative response unless otherwise noted:   Have you had symptoms of covid, tested positive or been in contact with someone with symptoms/positive test in the past 5-10 days?

## 2021-01-16 DIAGNOSIS — Z01818 Encounter for other preprocedural examination: Secondary | ICD-10-CM | POA: Diagnosis not present

## 2021-01-16 DIAGNOSIS — I70201 Unspecified atherosclerosis of native arteries of extremities, right leg: Secondary | ICD-10-CM | POA: Diagnosis not present

## 2021-01-16 DIAGNOSIS — M272 Inflammatory conditions of jaws: Secondary | ICD-10-CM | POA: Diagnosis not present

## 2021-01-16 DIAGNOSIS — I723 Aneurysm of iliac artery: Secondary | ICD-10-CM | POA: Diagnosis not present

## 2021-01-16 DIAGNOSIS — I745 Embolism and thrombosis of iliac artery: Secondary | ICD-10-CM | POA: Diagnosis not present

## 2021-01-17 ENCOUNTER — Other Ambulatory Visit: Payer: Self-pay

## 2021-01-17 ENCOUNTER — Encounter: Payer: Self-pay | Admitting: Internal Medicine

## 2021-01-17 ENCOUNTER — Ambulatory Visit (INDEPENDENT_AMBULATORY_CARE_PROVIDER_SITE_OTHER): Payer: Medicaid Other | Admitting: Internal Medicine

## 2021-01-17 VITALS — BP 117/83 | HR 85 | Temp 97.3°F | Wt 231.1 lb

## 2021-01-17 DIAGNOSIS — M272 Inflammatory conditions of jaws: Secondary | ICD-10-CM

## 2021-01-17 MED ORDER — AMOXICILLIN-POT CLAVULANATE 875-125 MG PO TABS: 1.0000 | ORAL_TABLET | Freq: Two times a day (BID) | ORAL | 0 refills | Status: DC

## 2021-01-17 NOTE — Progress Notes (Signed)
   Subjective:    Patient ID: [name] [name], male    DOB: 28-Jul-1960, 60 y.o.   MRN: [id number]  HPI He is here for a work in visit for concern for reinfection of his left jaw area.   He has noted continued pain and the area remains open.  No signficant discharge, no pus.  Some scabbing and flesh noted.  No erythema around it. No fever, no chills.    Review of Systems  Constitutional:  Negative for chills and fever.  Gastrointestinal:  Negative for diarrhea and nausea.  Skin:  Negative for rash.      Objective:   Physical Exam HENT:     Mouth/Throat:     Comments: Left jaw at known site remains open with no erythema, minimal tenderness, no pus or significant drainage.  No cerivcal lad.   Eyes:     General: No scleral icterus. Neurological:     Mental Status: He is alert.          Assessment & Plan:

## 2021-01-17 NOTE — Assessment & Plan Note (Addendum)
He may have some element of infection but difficult to tell.  I suspect though it is mainly due to the underlying process.  I will though give him a course of Augmentin and he will follow up with his ENT surgeon for further management.   20 minutes spent including > 50% of the time face to face in discussion and exam.

## 2021-01-21 ENCOUNTER — Encounter: Payer: Self-pay | Admitting: *Deleted

## 2021-01-26 ENCOUNTER — Other Ambulatory Visit: Payer: Self-pay | Admitting: Internal Medicine

## 2021-01-26 DIAGNOSIS — Z7189 Other specified counseling: Secondary | ICD-10-CM

## 2021-01-27 ENCOUNTER — Other Ambulatory Visit: Payer: Self-pay

## 2021-01-27 DIAGNOSIS — Z7189 Other specified counseling: Secondary | ICD-10-CM

## 2021-01-27 NOTE — Telephone Encounter (Signed)
From patient's chart, I cannot tell what the metoprolol is for. Looks like his HTN is treated with Amlodipine.

## 2021-01-27 NOTE — Telephone Encounter (Signed)
metoprolol succinate (TOPROL-XL) 25 MG 24 hr tablet, refill request @  [id number] [place] [place] Phone:  [phone]  Fax:  [phone]

## 2021-01-28 MED ORDER — METOPROLOL SUCCINATE ER 25 MG PO TB24: 25.0000 mg | ORAL_TABLET | Freq: Every day | ORAL | 1 refills | Status: DC

## 2021-01-29 ENCOUNTER — Other Ambulatory Visit (HOSPITAL_COMMUNITY): Payer: Self-pay

## 2021-01-30 ENCOUNTER — Other Ambulatory Visit: Payer: Self-pay | Admitting: Hematology and Oncology

## 2021-01-30 ENCOUNTER — Other Ambulatory Visit (HOSPITAL_COMMUNITY): Payer: Self-pay

## 2021-01-30 MED ORDER — ENOXAPARIN SODIUM 100 MG/ML IJ SOSY
100.0000 mg | PREFILLED_SYRINGE | Freq: Two times a day (BID) | INTRAMUSCULAR | 0 refills | Status: DC
  Filled 2021-01-30: qty 60, 30d supply, fill #0

## 2021-01-31 ENCOUNTER — Other Ambulatory Visit (HOSPITAL_COMMUNITY): Payer: Self-pay

## 2021-02-03 DIAGNOSIS — M8730 Other secondary osteonecrosis, unspecified bone: Secondary | ICD-10-CM | POA: Diagnosis not present

## 2021-02-03 DIAGNOSIS — Y842 Radiological procedure and radiotherapy as the cause of abnormal reaction of the patient, or of later complication, without mention of misadventure at the time of the procedure: Secondary | ICD-10-CM | POA: Diagnosis not present

## 2021-02-14 ENCOUNTER — Telehealth: Payer: Self-pay | Admitting: Student

## 2021-02-14 NOTE — Telephone Encounter (Signed)
Spoke with patient's significant other, Manual Meier, to offer to schedule a Palliative f/u visit, this was scheduled for 03/25/21 @ 2 PM.  [name] stated that patient will need a refill on his Oxycodone by next week, informed [name] that I would notify the NP of this.

## 2021-02-17 ENCOUNTER — Inpatient Hospital Stay: Payer: Medicaid Other | Admitting: Hematology and Oncology

## 2021-02-17 ENCOUNTER — Inpatient Hospital Stay: Payer: Medicaid Other

## 2021-02-17 ENCOUNTER — Telehealth: Payer: Self-pay | Admitting: Hematology and Oncology

## 2021-02-17 NOTE — Telephone Encounter (Signed)
R/s appt per 7/29 sch msg. Pt aware.

## 2021-02-18 ENCOUNTER — Other Ambulatory Visit: Payer: Medicaid Other | Admitting: Student

## 2021-02-18 ENCOUNTER — Other Ambulatory Visit: Payer: Self-pay

## 2021-02-18 DIAGNOSIS — Z515 Encounter for palliative care: Secondary | ICD-10-CM

## 2021-02-18 DIAGNOSIS — M272 Inflammatory conditions of jaws: Secondary | ICD-10-CM | POA: Diagnosis not present

## 2021-02-18 DIAGNOSIS — R52 Pain, unspecified: Secondary | ICD-10-CM

## 2021-02-18 NOTE — Progress Notes (Signed)
[facility] Consult Note Telephone: [phone]  Fax: [phone]    Date of encounter: 02/18/2021 PATIENT NAME: [name] [name] [place] [address]   [phone] (home)  DOB: 1960-11-14 MRN: [id number] PRIMARY CARE PROVIDER:    [name], MD,  [address] [place] [address] [phone]  REFERRING PROVIDER:   [name], MD [address] [place],  [place] [address] [phone]  RESPONSIBLE PARTY:    Contact Information     Name Relation Home Work Mobile   [name] [name] Significant other [place] Daughter   [phone]       Due to the COVID-19 crisis, this visit was done via telemedicine from my office and it was initiated and consent by this patient and or family.  I connected with  [name] [name] on 02/18/2021 by a video enabled telemedicine application and verified that I am speaking with the correct person using two identifiers.   I discussed the limitations of evaluation and management by telemedicine. The patient expressed understanding and agreed to proceed.                                    ASSESSMENT AND PLAN / RECOMMENDATIONS:   Advance Care Planning/Goals of Care: Goals include to maximize quality of life and symptom management.    CODE STATUS: Full Code  Symptom Management/Plan:  Osteomyelitis of left jaw-patient has received a second opinion. He will proceed with having surgery. He feels his pain is currently managed with current regimen. Script sent for Oxycodone '5mg'$  every 8 hours PRN. Recommend continuing gabapentin PRN pain. Follow up with Oncology as scheduled.  Follow up Palliative Care Visit: Palliative care will continue to follow for complex medical decision making, advance care planning, and clarification of goals. Return in 4 weeks or prn.  I spent 25 minutes providing this consultation. More than 50% of the time in  this consultation was spent in counseling and care coordination.    PPS: 50%  HOSPICE ELIGIBILITY/DIAGNOSIS: TBD  Chief Complaint: Palliative Medicine follow up visit.   HISTORY OF PRESENT ILLNESS:  [name] [name] is a 60 y.o. year old male  with osteomyelitis of left jaw, tonsillar cancer, hypertension, hyperlipidemia, hx of DVT and PE; currently on coumadin and Lovenox.  Patient states he has received a second opinion and plans to proceed with surgery. He endorses pain; today pain is a 2/10. He states the oxycodone 5 mg every 8 hours PRN is managing his pain. He takes gabapentin PRN. He denies shortness of breath, nausea, constipation. He is able to complete adl's with some assistance. He states he is mainly on a liquid diet and some soft foods. He does report oozing, phlegm/mucous. PICC line had been removed from his right arm. He states pain, swelling to arm is improving. His sleep is fair, takes melatonin; has tried prn ativan for his anxiety.   History obtained from review of EMR, discussion with primary team, and interview with family, facility staff/caregiver and/or [name] [name].  I reviewed available labs, medications, imaging, studies and related documents from the EMR.  Records reviewed and summarized above.   ROS  General: NAD EYES: denies vision changes ENMT: dysphagia Cardiovascular: denies chest pain, denies DOE Pulmonary:  cough, denies increased SOB Abdomen: denies constipation, endorses continence of bowel GU: denies dysuria, endorses  continence of urine MSK: weakness, no falls reported Skin: denies rashes or wounds Neurological: denies pain, denies insomnia Psych: Endorses stable mood Heme/lymph/immuno: denies bruises, abnormal bleeding  Physical Exam:  PE deferred due to this being a Telemedicine visit.    Thank you for the opportunity to participate in the care of [name] [name].  The palliative care team will continue to follow. Please call our office at  [phone] if we can be of additional assistance.   [name] [place], NP   COVID-19 PATIENT SCREENING TOOL Asked and negative response unless otherwise noted:   Have you had symptoms of covid, tested positive or been in contact with someone with symptoms/positive test in the past 5-10 days?  No

## 2021-02-21 ENCOUNTER — Inpatient Hospital Stay (HOSPITAL_BASED_OUTPATIENT_CLINIC_OR_DEPARTMENT_OTHER): Payer: Medicaid Other | Admitting: Hematology and Oncology

## 2021-02-21 ENCOUNTER — Other Ambulatory Visit: Payer: Self-pay

## 2021-02-21 ENCOUNTER — Inpatient Hospital Stay: Payer: Medicaid Other | Attending: Hematology and Oncology

## 2021-02-21 ENCOUNTER — Other Ambulatory Visit: Payer: Self-pay | Admitting: Hematology and Oncology

## 2021-02-21 DIAGNOSIS — F17210 Nicotine dependence, cigarettes, uncomplicated: Secondary | ICD-10-CM | POA: Diagnosis not present

## 2021-02-21 DIAGNOSIS — E039 Hypothyroidism, unspecified: Secondary | ICD-10-CM | POA: Insufficient documentation

## 2021-02-21 DIAGNOSIS — B370 Candidal stomatitis: Secondary | ICD-10-CM | POA: Insufficient documentation

## 2021-02-21 DIAGNOSIS — Z7901 Long term (current) use of anticoagulants: Secondary | ICD-10-CM | POA: Diagnosis not present

## 2021-02-21 DIAGNOSIS — I82621 Acute embolism and thrombosis of deep veins of right upper extremity: Secondary | ICD-10-CM | POA: Diagnosis not present

## 2021-02-21 DIAGNOSIS — C099 Malignant neoplasm of tonsil, unspecified: Secondary | ICD-10-CM | POA: Insufficient documentation

## 2021-02-21 DIAGNOSIS — C090 Malignant neoplasm of tonsillar fossa: Secondary | ICD-10-CM

## 2021-02-21 DIAGNOSIS — I82402 Acute embolism and thrombosis of unspecified deep veins of left lower extremity: Secondary | ICD-10-CM | POA: Insufficient documentation

## 2021-02-21 LAB — CBC WITH DIFFERENTIAL (CANCER CENTER ONLY)
Abs Immature Granulocytes: 0.02 10*3/uL (ref 0.00–0.07)
Basophils Absolute: 0 10*3/uL (ref 0.0–0.1)
Basophils Relative: 1 %
Eosinophils Absolute: 0.2 10*3/uL (ref 0.0–0.5)
Eosinophils Relative: 3 %
HCT: 45.5 % (ref 39.0–52.0)
Hemoglobin: 16.1 g/dL (ref 13.0–17.0)
Immature Granulocytes: 0 %
Lymphocytes Relative: 19 %
Lymphs Abs: 0.9 10*3/uL (ref 0.7–4.0)
MCH: 35.2 pg — ABNORMAL HIGH (ref 26.0–34.0)
MCHC: 35.4 g/dL (ref 30.0–36.0)
MCV: 99.6 fL (ref 80.0–100.0)
Monocytes Absolute: 0.5 10*3/uL (ref 0.1–1.0)
Monocytes Relative: 12 %
Neutro Abs: 2.9 10*3/uL (ref 1.7–7.7)
Neutrophils Relative %: 65 %
Platelet Count: 151 10*3/uL (ref 150–400)
RBC: 4.57 MIL/uL (ref 4.22–5.81)
RDW: 13.2 % (ref 11.5–15.5)
WBC Count: 4.5 10*3/uL (ref 4.0–10.5)
nRBC: 0 % (ref 0.0–0.2)

## 2021-02-21 LAB — CMP (CANCER CENTER ONLY)
ALT: 49 U/L — ABNORMAL HIGH (ref 0–44)
AST: 41 U/L (ref 15–41)
Albumin: 4 g/dL (ref 3.5–5.0)
Alkaline Phosphatase: 76 U/L (ref 38–126)
Anion gap: 10 (ref 5–15)
BUN: 9 mg/dL (ref 8–23)
CO2: 27 mmol/L (ref 22–32)
Calcium: 10.2 mg/dL (ref 8.9–10.3)
Chloride: 102 mmol/L (ref 98–111)
Creatinine: 0.89 mg/dL (ref 0.61–1.24)
GFR, Estimated: >60 mL/min (ref >60)
Glucose, Bld: 95 mg/dL (ref 70–99)
Potassium: 4.3 mmol/L (ref 3.5–5.1)
Sodium: 139 mmol/L (ref 135–145)
Total Bilirubin: 0.9 mg/dL (ref 0.3–1.2)
Total Protein: 7.1 g/dL (ref 6.5–8.1)

## 2021-02-21 MED ORDER — CYCLOBENZAPRINE HCL 10 MG PO TABS: ORAL_TABLET | ORAL | 0 refills | Status: DC

## 2021-02-21 MED ORDER — RIVAROXABAN (XARELTO) VTE STARTER PACK (15 & 20 MG): ORAL_TABLET | ORAL | 0 refills | Status: DC

## 2021-02-22 LAB — T4: T4, Total: 6.4 ug/dL (ref 4.5–12.0)

## 2021-02-24 ENCOUNTER — Telehealth: Payer: Self-pay | Admitting: Hematology and Oncology

## 2021-02-24 LAB — TSH: TSH: 6.829 u[IU]/mL — ABNORMAL HIGH (ref 0.320–4.118)

## 2021-02-24 NOTE — Telephone Encounter (Signed)
Scheduled per los. Called and left msg. Mailed printout  °

## 2021-03-04 ENCOUNTER — Encounter: Payer: Self-pay | Admitting: Hematology

## 2021-03-04 NOTE — Progress Notes (Signed)
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 TBenjamine Mola invasive SCCa, p16+; not a candidate for TORS -Late 10/2018 - 12/2018: definitive chemoradiation with weekly cisplatin  03/2019: end-of-treatment PET showed decrease in FDG avidity in the left tonsil but some residual soft tissue fullness, resolution of left LN disease   -2021:  Diffuse pharyngeal soft tissue thickening at the tongue base on multiple CT's  [facility]; no evidence of malignancy    #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 -12/2018: acute DVT involving R peroneal vein and L gastrocnemius vein; new arterial thrombosis involving R common femoral artery and popliteal arteries, due to artery aneurysm -Late 12/2018: progression of acute DVT in the LLE from gastrocnemius vein to the femoral, popliteal, posterior tibial and peroneal veins despite being on Eliquis  -Currently on warfarin  11/25/2020: Developed a right upper extremity deep and superficial vein thromboses secondary to line placement.  Started on Lovenox 1 mg/kg every 12  hours. 02/21/2021: Transition to Xarelto therapy.  Interval History:  [name] LEDYARD60y.o. male reports he has been tolerating the Lovenox therapy well.  He notes that he still feels a firm vein in his right arm.  He notes that he has not been having any issues with bleeding on the Lovenox.  He is willing to transition over to Xarelto therapy at this time.  He notes that he had a fiberoptic exam with Dr. RConstance Holsterlast month and that the lesion on his neck is currently an eschar and well-appearing without any drainage.  He also reports that his energy levels are good and is not having a trouble with signs or symptoms of hypothyroidism.  He denies any fevers, chills, night sweats, shortness of breath, chest pain or cough. He has no other complaints. A full 10 point ROS is listed below.  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[facility] 05/25/2017   Right SFA 05/07/17 while on warfarin INR 2.9   Benign essential HTN 01/04/2012   Cancer ([facility]    tonsilar   Chronic anticoagulation 01/02/2013   Dermatofibroma of forearm 01/02/2013   Left side   Hyperlipidemia, mixed 01/04/2012   Polycythemia secondary to smoking 01/04/2012   Primary hypercoagulable state ([facility] 10/01/2014   Sinus bradycardia, chronic 01/04/2012   Superficial thrombosis of lower extremity 05/02/2012    SURGICAL HISTORY: Past Surgical History:  Procedure Laterality Date   DIRECT LARYNGOSCOPY Left 10/19/2018   Procedure: DIRECT LARYNGOSCOPY;  Surgeon: TLeta Baptist MD;  [facility]  Service: ENT;  Laterality: Left;   IR IMAGING GUIDED PORT INSERTION  11/04/2018   IR REMOVAL TUN ACCESS W/ PORT W/O FL MOD SED  05/01/2019   TONSILLECTOMY Left 10/19/2018   Procedure: BIOPSY OF LEFT TONSIL;  Surgeon: Leta Baptist, MD;  Location: [place];  Service: ENT;   Laterality: Left;    SOCIAL HISTORY: Social History   Socioeconomic History   Marital status: Divorced    Spouse name: Not on file   Number of children: 2   Years of education: Not on file   Highest education level: Not on file  Occupational History   Not on file  Tobacco Use   Smoking status: Light Smoker    Packs/day: 0.50    Years: 30.00    Pack years: 15.00    Types: Cigarettes    Last attempt to quit: 10/19/2018    Years since quitting: 2.3   Smokeless tobacco: Never  Vaping Use   Vaping Use: Former   Substances: Nicotine, Flavoring   Devices: 1 cartridge q2-3 days, he tells me he is not using.   Substance and Sexual Activity   Alcohol use: Yes    Alcohol/week: 13.0 - 14.0 standard drinks    Types: 12 Cans of beer, 1 - 2 Shots of liquor per week    Comment: 1-2 times per week.   Drug use: No   Sexual activity: Not on file  Other Topics Concern   Not on file  Social History Narrative   Not on file   Social Determinants of Health   Financial Resource Strain: Not on file  Food Insecurity: Not on file  Transportation Needs: Not on file  Physical Activity: Not on file  Stress: Not on file  Social Connections: Not on file  Intimate Partner Violence: Not on file    FAMILY HISTORY: Family History  Problem Relation Age of Onset   Stroke Father     ALLERGIES:  has No Known Allergies.  MEDICATIONS:  Current Outpatient Medications  Medication Sig Dispense Refill   RIVAROXABAN (XARELTO) VTE STARTER PACK (15 & 20 MG) Follow package directions: Take one '15mg'$  tablet by mouth twice a day. On day 22, switch to one '20mg'$  tablet once a day. Take with food. 51 each 0   atorvastatin (LIPITOR) 20 MG tablet Take 1 tablet (20 mg total) by mouth daily. 30 tablet 11   cyclobenzaprine (FLEXERIL) 10 MG tablet TAKE 1 TABLET BY MOUTH THREE TIMES A DAY AS NEEDED FOR MUSCLE SPASMS 30 tablet 0   enoxaparin (LOVENOX) 100 MG/ML injection Inject 1 mL  into the skin every 12  hours. 80 mL  0   gabapentin (NEURONTIN) 100 MG capsule Take 200 mg by mouth every 8 (eight) hours as needed.     ibuprofen (ADVIL) 200 MG tablet Take 600 mg by mouth every 6 (six) hours as needed for pain or fever.      levothyroxine (SYNTHROID) 25 MCG tablet Take 1 tablet (25 mcg total) by mouth daily before breakfast. Take on empty stomach w/ water, 1 hour before other meds or food. 30 tablet 5   metoprolol succinate (TOPROL-XL) 25 MG 24 hr tablet Take 1 tablet (25 mg total) by mouth daily. 90 tablet 1   oxyCODONE (OXY IR/ROXICODONE) 5 MG immediate release tablet Take 5 mg by mouth every 8 (eight) hours as needed.     sodium fluoride (PREVIDENT 5000 PLUS) 1.1 % CREA dental cream Apply to tooth brush. Brush teeth for 2 minutes. Spit out excess. DO NOT rinse afterwards. Repeat nightly. 1 Tube prn  vitamin E 180 MG (400 UNITS) capsule Take 400 IU daily x 1 week, then 400 IU BID 60 capsule 5   No current facility-administered medications for this visit.    REVIEW OF SYSTEMS:   Constitutional: ( - ) fevers, ( - )  chills , ( - ) night sweats Eyes: ( - ) blurriness of vision, ( - ) double vision, ( - ) watery eyes Ears, nose, mouth, throat, and face: ( - ) mucositis, ( - ) sore throat Respiratory: ( - ) cough, ( - ) dyspnea, ( - ) wheezes Cardiovascular: ( - ) palpitation, ( - ) chest discomfort, ( - ) lower extremity swelling Gastrointestinal:  ( - ) nausea, ( - ) heartburn, ( - ) change in bowel habits Skin: ( - ) abnormal skin rashes Lymphatics: ( - ) new lymphadenopathy, ( - ) easy bruising Neurological: ( - ) numbness, ( - ) tingling, ( - ) new weaknesses Behavioral/Psych: ( - ) mood change, ( - ) new changes  All other systems were reviewed with the patient and are negative.  PHYSICAL EXAMINATION: ECOG PERFORMANCE STATUS: 1 - Symptomatic but completely ambulatory  Vitals:   02/21/21 1546  BP: (!) 125/97  Pulse: 89  Resp: 18  Temp: 97.6 F (36.4 C)  SpO2: 98%   Filed Weights   02/21/21  1546  Weight: 233 lb 1.6 oz (105.7 kg)    GENERAL: well appearing middle aged Caucasian male alert, no distress and comfortable SKIN: skin color, texture, turgor are normal, no rashes or significant lesions EYES: conjunctiva are pink and non-injected, sclera clear OROPHARYNX: exam limited as patient cannot open his mouth wide. Thick salivations noted  LUNGS: clear to auscultation and percussion with normal breathing effort HEART: regular rate & rhythm and no murmurs and no lower extremity edema Musculoskeletal: Mild edema involving the right upper extremity. PSYCH: alert & oriented x 3, fluent speech NEURO: no focal motor/sensory deficits  LABORATORY DATA:  I have reviewed the data as listed CBC Latest Ref Rng & Units 02/21/2021 01/03/2021 11/25/2020  WBC 4.0 - 10.5 K/uL 4.5 5.2 7.1  Hemoglobin 13.0 - 17.0 g/dL 16.1 16.0 17.0  Hematocrit 39.0 - 52.0 % 45.5 46.3 49.5  Platelets 150 - 400 K/uL 151 152 159    CMP Latest Ref Rng & Units 02/21/2021 01/03/2021 11/25/2020  Glucose 70 - 99 mg/dL 95 100(H) 106(H)  BUN 8 - 23 mg/dL '9 9 7  '$ Creatinine 0.61 - 1.24 mg/dL 0.89 1.04 0.78  Sodium 135 - 145 mmol/L 139 139 136  Potassium 3.5 - 5.1 mmol/L 4.3 4.2 3.5  Chloride 98 - 111 mmol/L 102 104 105  CO2 22 - 32 mmol/L '27 24 23  '$ Calcium 8.9 - 10.3 mg/dL 10.2 10.0 8.9  Total Protein 6.5 - 8.1 g/dL 7.1 7.0 6.7  Total Bilirubin 0.3 - 1.2 mg/dL 0.9 0.6 0.8  Alkaline Phos 38 - 126 U/L 76 78 73  AST 15 - 41 U/L 41 36 25  ALT 0 - 44 U/L 49(H) 38 19    RADIOGRAPHIC STUDIES: No results found.   ASSESSMENT & PLAN [name] [name] 60 y.o. male with medical history significant for squamous cell carcinoma of left tonsil presents for a follow up visit.    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 Lennox Laity, ENT at [facility] -  Multiple discussions were held with radiation oncology and E[facility] at [facility].  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 Patient is going to [place] next week for a second opinion.  -RTC in August 2022 for a follow up.   #Neck Abscess, resolved --Encourage follow-up with ENT   #Hypothyroidisim, radiation induced --current on levothyroxine 26 mcg PO daily.  --TSH is 6.829 (H) and T4 is 6.4  --Recommend to continue current dose of levothyroxine.   #Recurrent LLE DVT #Acute Right Upper Extremity DVT/SVT --patient had a midline placed for abx, developed a subsequent RUE DVT --this is considered provoked in the setting of a catheter, though patient is prone to clotting. --previously on therapeutic '1mg'$ /kg lovenox x 3 months since May 2022. HOLD coumadin. -- Recommended the patient not return to Coumadin therapy --We will transition the patient to Xarelto today.  Called in a starter pack to his pharmacy. --Patient requires indefinite anticoagulation   #Persistent oral candidiasis -Despite aggressive antifungal therapy, including amphotericin, there was no significant improvement in oral candidiasis -Infectious disease felt that the oral candidiasis was noninfectious, and therefore does not warrant any further treatment at this time -I counseled the patient on the importance of maintaining good oral hygiene            No orders of the defined types were placed in this encounter.  All questions were answered. The patient knows to call the clinic with any problems, questions or  concerns.  I have spent a total of 25 minutes minutes of face-to-face and non-face-to-face time, preparing to see the patient, obtaining and/or reviewing separately obtained history, performing a medically appropriate examination, counseling and educating the patient, documenting clinical information in the electronic health record, and care coordination.   Dede Query PA-C Department of Hematology/Oncology [place] [facility] Phone: [phone]

## 2021-03-25 ENCOUNTER — Other Ambulatory Visit: Payer: Medicaid Other | Admitting: Student

## 2021-03-25 ENCOUNTER — Other Ambulatory Visit: Payer: Self-pay

## 2021-03-28 ENCOUNTER — Other Ambulatory Visit: Payer: Self-pay

## 2021-03-28 ENCOUNTER — Other Ambulatory Visit: Payer: Medicaid Other | Admitting: Student

## 2021-03-28 DIAGNOSIS — Z515 Encounter for palliative care: Secondary | ICD-10-CM

## 2021-03-28 DIAGNOSIS — F419 Anxiety disorder, unspecified: Secondary | ICD-10-CM | POA: Diagnosis not present

## 2021-03-28 DIAGNOSIS — M272 Inflammatory conditions of jaws: Secondary | ICD-10-CM

## 2021-03-28 DIAGNOSIS — K5900 Constipation, unspecified: Secondary | ICD-10-CM

## 2021-03-28 NOTE — Progress Notes (Signed)
[place] Consult Note Telephone: [phone]  Fax: [phone]    Date of encounter: 03/28/21 1:56 PM PATIENT NAME: [name] [name] [place] [address]   [phone] (home)  DOB: November 06, 1960 MRN: [id number] PRIMARY CARE PROVIDER:    [name], MD,  [address] [place] [address] [phone]  REFERRING PROVIDER:   [name], MD [address] [place],  [place] [address] [phone]  RESPONSIBLE PARTY:    Contact Information     Name Relation Home Work Mobile   [place] Significant other [place] Daughter   [phone]        I met face to face with patient and family in the home. Palliative Care was asked to follow this patient by consultation request of  [name], MD to address advance care planning and complex medical decision making. This is a follow up visit.                                   ASSESSMENT AND PLAN / RECOMMENDATIONS:   Advance Care Planning/Goals of Care: Goals include to maximize quality of life and symptom management. 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 He will proceed with having surgery; anticipating surgery in November. Pain is currently managed with current regimen.Patient will need to stop smoking. Patient has started chantix for smoking cessation. Script sent for Oxycodone 76m every 8 hours PRN. Recommend continuing gabapentin PRN pain. Continue soft diet/liquids. Follow up with Oncology as scheduled.  Constipation-occasional constipation. Recommend miralax 17 gm daily.  Anxiety-continue lorazepam prn. Script sent for 15 day supply.   Follow up Palliative Care Visit: Palliative care will continue to follow for complex medical decision making, advance  care planning, and clarification of goals. Return in 8 weeks or prn.  I spent 60 minutes providing this consultation. More than 50% of the time in this consultation was spent in counseling and care coordination.   PPS: 50%  HOSPICE ELIGIBILITY/DIAGNOSIS: TBD  Chief Complaint: Palliative Medicine follow up visit.   HISTORY OF PRESENT ILLNESS:  [name] SAWAis a 60y.o. year old male  with  osteomyelitis of left jaw, tonsillar cancer, hypertension, hyperlipidemia, hx of DVT and PE.   Patient reports doing okay. He has started chantix to help stop smoking before having surgery. Anticipating surgery sometime in November. Drainage has reduced substantially. Intake is mostly liquids. Weight has been steady. He does report occasional constipation. Has completed Lovenox injections. Plan is to transition to Xarelto; currently taking coumadin. Swelling to right arm improved; he states there is some mild tenderness. Pain today 1/10. He does report occasional anxiety; takes lorazepam as needed. No recent ED visits or hospitalizations. A 10-point review of systems is negative, except for the pertinent positives and negatives detailed in the HPI.          History obtained from review of EMR, discussion with primary team, and interview with family, facility staff/caregiver and/or [name] [name]  I reviewed available labs, medications, imaging, studies and related documents from the EMR.  Records reviewed and summarized above.    Physical Exam: Pulse 68, resp 16, sats 97% on room air Constitutional: NAD General: frail appearing EYES: anicteric sclera, lids  intact, no discharge  ENMT: intact hearing, oral mucous membranes moist CV: S1S2, RRR, no LE edema Pulmonary: LCTA, no increased work of breathing, room air Abdomen: normo-active BS + 4 quadrants, soft and non tender GU: deferred MSK: moves all extremities, ambulatory Skin: warm and dry, no rashes, bandage on left side of face/jaw Neuro:  no  generalized weakness, A & O x 3 Psych: non-anxious affect, pleasant Hem/lymph/immuno: no widespread bruising   Thank you for the opportunity to participate in the care of [name] [name].  The palliative care team will continue to follow. Please call our office at [phone] if we can be of additional assistance.   Ezekiel Slocumb, NP   COVID-19 PATIENT SCREENING TOOL Asked and negative response unless otherwise noted:   Have you had symptoms of covid, tested positive or been in contact with someone with symptoms/positive test in the past 5-10 days? No

## 2021-05-26 ENCOUNTER — Other Ambulatory Visit: Payer: Medicaid Other | Admitting: Student

## 2021-05-26 ENCOUNTER — Other Ambulatory Visit: Payer: Self-pay

## 2021-05-26 DIAGNOSIS — M272 Inflammatory conditions of jaws: Secondary | ICD-10-CM | POA: Diagnosis not present

## 2021-05-26 DIAGNOSIS — Z515 Encounter for palliative care: Secondary | ICD-10-CM

## 2021-05-26 DIAGNOSIS — R0982 Postnasal drip: Secondary | ICD-10-CM | POA: Diagnosis not present

## 2021-05-26 DIAGNOSIS — C024 Malignant neoplasm of lingual tonsil: Secondary | ICD-10-CM | POA: Diagnosis not present

## 2021-05-26 DIAGNOSIS — I82409 Acute embolism and thrombosis of unspecified deep veins of unspecified lower extremity: Secondary | ICD-10-CM | POA: Diagnosis not present

## 2021-05-26 IMAGING — XA IR PICC >5YO
1 series · 2 of 2 positions shown · non-contrast
Comparison: none

INDICATION: 58-year-old male with a history of IV antibiotics

[Series 1: fl angio · 2 of 2 slices shown]
[im 1/2]
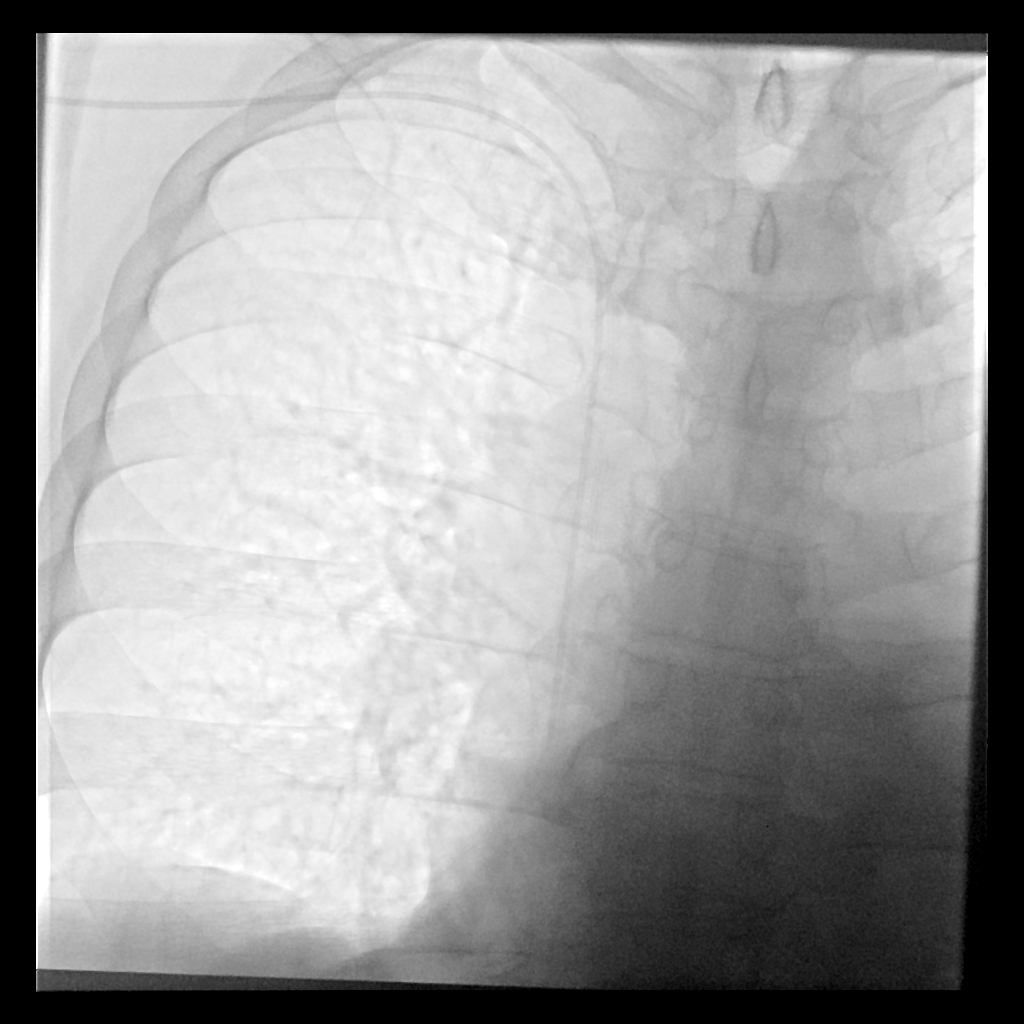
[im 2/2]
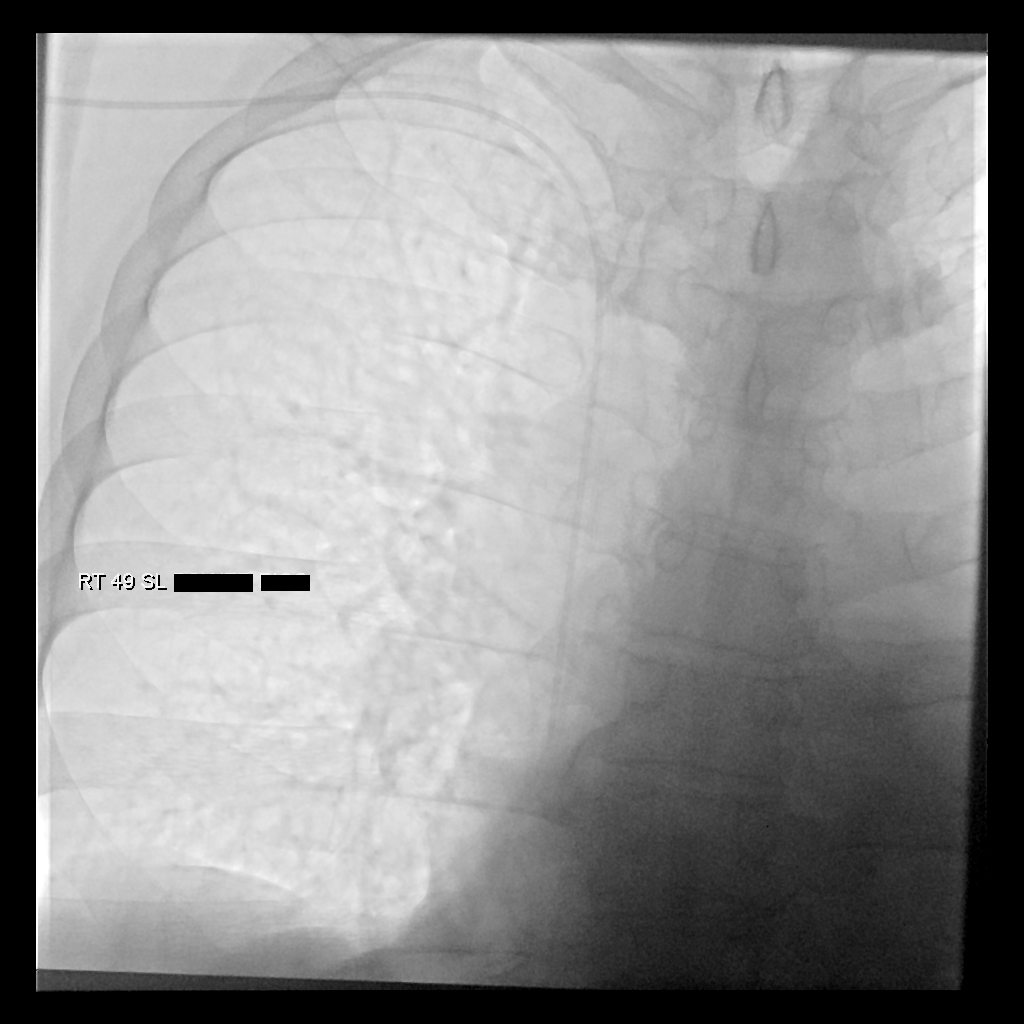

[2 of 2 positions shown; findings below may reference images not displayed]

EXAM:
PICC LINE PLACEMENT WITH ULTRASOUND AND FLUOROSCOPIC GUIDANCE

MEDICATIONS:
None

ANESTHESIA/SEDATION:
None

FLUOROSCOPY TIME:  Fluoroscopy Time: 0 minutes 6 seconds (1 mGy).

COMPLICATIONS:
None

PROCEDURE:
Informed written consent was obtained from the patient after a
thorough discussion of the procedural risks, benefits and
alternatives. All questions were addressed. Maximal Sterile Barrier
Technique was utilized including caps, mask, sterile gowns, sterile
gloves, sterile drape, hand hygiene and skin antiseptic. A timeout
was performed prior to the initiation of the procedure.

Dr Kuusor and Ms Lesya Aujla, PA participated with placement of the
PICC.

Patient was position in the supine position on the fluoroscopy table
with the right arm abducted 90 degrees. Ultrasound survey of the
upper extremity was performed with images stored and sent to PACs.

The right basilic vein was selected for access.

Once the patient was prepped and draped in the usual sterile
fashion, the skin and subcutaneous tissues were generously
infiltrated with 1% lidocaine for local anesthesia.

A micropuncture access kit was then used to access the targeted
vein. Wire was passed centrally, confirmed to be within the venous
system under fluoroscopy. A small stab incision was made with an 11
blade scalpel and the sheath was then placed over the wire.
Estimated length of the catheter was then performed with the
indwelling wire.

Catheter was amputated at 49 cm length and placed with coaxial wire
through the peel-away.

Single-lumen, power injectable PICC in the basilic vein. Tip
confirmed at the cavoatrial junction, and the catheter is ready for
use.

Stat lock was placed.

Patient tolerated the procedure well and remained hemodynamically
stable throughout.

No complications were encountered and no significant blood loss was
encountered.
IMPRESSION: Status post right basilic vein single-lumen power injectable PICC.
Catheter ready for use.

## 2021-05-26 NOTE — Progress Notes (Signed)
[place] Consult Note Telephone: [phone]  Fax: [phone]    Date of encounter: 05/26/21 12:37 PM PATIENT NAME: [name] [name] [id number]   [phone] (home)  DOB: 06-10-1961 MRN: [id number] PRIMARY CARE PROVIDER:    [name], MD,  [address] [place] [address] [phone]  REFERRING PROVIDER:   [name], MD [address] [place],  [place] [address] [phone]  RESPONSIBLE PARTY:    Contact Information     Name Relation Home Work Mobile   [name] [name] Significant other [name] [name] [name] [name]   [phone]        I met face to face with patient in the home. Palliative Care was asked to follow this patient by consultation request of  [name] Hansen, MD to address advance care planning and complex medical decision making. This is a follow up visit.                                   ASSESSMENT AND PLAN / RECOMMENDATIONS:   Advance Care Planning/Goals of Care: Goals include to maximize quality of life and symptom management.   CODE STATUS: Full Code  Symptom Management/Plan:  Squamous cell carcinoma of left tonsil-patient currently under surveillance. Osteomyelitis-patient states he is uncertain if he will proceed surgery for mandible debridement with possible trach and pectoralis flap. He is to follow up with ENT as scheduled; he would like to speak with them again prior to making a decision. He has started chantix and states he is rarely smoking. He continues to have pain; will send script for refill for Oxycodone 62m every 8 hours PRN. Continue gabapentin PRN pain.  Post nasal drip-patient report worsening post nasal drip at night.  Recommend saline nasal spray 1-2 sprays in each nostril BID prn, cetirizine 10 mg daily.   Recurrent DVT's-patient had not started his Xarelto starter pack. He is encouraged to start now as  he has history of recurrent DVT's, [place]. He would require chronic anticoagulation. He verbalizes understanding.   Follow up Palliative Care Visit: Palliative care will continue to follow for complex medical decision making, advance care planning, and clarification of goals. Return in 4 weeks or prn.  I spent 40 minutes providing this consultation. More than 50% of the time in this consultation was spent in counseling and care coordination.   PPS: 50%  HOSPICE ELIGIBILITY/DIAGNOSIS: TBD  Chief Complaint: Palliative Medicine follow up visit.   HISTORY OF PRESENT ILLNESS:  [name] FEBUSis a 60y.o. year old male  with  osteomyelitis of left jaw, squamous cell carcinoma of left tonsil,  hypertension, hyperlipidemia, hx of DVT and PE.  Patient reports doing well. He recently attended his [name] [name]'s wedding. He states pain is currently managed well with oxycodone. He states he is rarely smoking; feels the chantix is helping. Patient states he has not started the Xarelto for some reason. Patient with history of recurrent DVT's, recent right upper extremity DVT/SVT. No pain to right arm. States he is uncertain if he will go through with the surgery. Patient denies any functional declines. He is eating mostly liquids, denies weight loss. He does report worsening post nasal drip at night. No fever, chills reported. A 10-point review of systems is negative, except for the pertinent positives and negatives  detailed in the HPI.      History obtained from review of EMR, discussion with primary team, and interview with family, facility staff/caregiver and/or [name] [name].  I reviewed available labs, medications, imaging, studies and related documents from the EMR.  Records reviewed and summarized above.     Physical Exam:  Pulse 96, resp 16, b/p 120/72, sats 97% bon room air Constitutional: NAD General: frail appearing EYES: anicteric sclera, lids intact, no discharge  ENMT:  intact hearing, oral mucous membranes moist CV: S1S2, RRR, no LE edema Pulmonary: LCTA, no increased work of breathing, no cough, room air Abdomen: normo-active BS + 4 quadrants, soft and non tender, no ascites GU: deferred MSK: moves all extremities, ambulatory Skin: warm and dry, no rashes or wounds on visible skin Neuro:  no generalized weakness, A & O x 3 Psych: non-anxious affect, pleasant Hem/lymph/immuno: no widespread bruising   Thank you for the opportunity to participate in the care of [name] [name].  The palliative care team will continue to follow. Please call our office at [phone] if we can be of additional assistance.   Ezekiel Slocumb, NP   COVID-19 PATIENT SCREENING TOOL Asked and negative response unless otherwise noted:   Have you had symptoms of covid, tested positive or been in contact with someone with symptoms/positive test in the past 5-10 days? No

## 2021-07-01 ENCOUNTER — Other Ambulatory Visit: Payer: Medicaid Other | Admitting: Student

## 2021-07-01 ENCOUNTER — Other Ambulatory Visit: Payer: Self-pay

## 2021-07-01 DIAGNOSIS — I82409 Acute embolism and thrombosis of unspecified deep veins of unspecified lower extremity: Secondary | ICD-10-CM

## 2021-07-01 DIAGNOSIS — M272 Inflammatory conditions of jaws: Secondary | ICD-10-CM

## 2021-07-01 DIAGNOSIS — R52 Pain, unspecified: Secondary | ICD-10-CM

## 2021-07-01 DIAGNOSIS — F419 Anxiety disorder, unspecified: Secondary | ICD-10-CM

## 2021-07-01 DIAGNOSIS — Z515 Encounter for palliative care: Secondary | ICD-10-CM

## 2021-07-01 NOTE — Progress Notes (Signed)
[facility] Consult Note Telephone: [phone]  Fax: [phone]    Date of encounter: 07/01/21 3:59 PM PATIENT NAME: [name] [name] [place] [address] [address]   [phone] (home)  DOB: [birth year] MRN: [id number] PRIMARY CARE PROVIDER:    [name], MD,  [address] [place] [address] [phone]  REFERRING PROVIDER:   [name], MD [address] [place],  [place] [address] [phone]  RESPONSIBLE PARTY:    Contact Information     Name Relation Home Work Mobile   Poe,Lisa Significant other [place] Daughter   [phone]       Due to the COVID-19 crisis, this visit was done via telemedicine from my office and it was initiated and consent by this patient and or family.  I connected with  [name] [name] OR PROXY on 07/01/21 by a video enabled telemedicine application and verified that I am speaking with the correct person using two identifiers.   I discussed the limitations of evaluation and management by telemedicine. The patient expressed understanding and agreed to proceed.                                    ASSESSMENT AND PLAN / RECOMMENDATIONS:   Advance Care Planning/Goals of Care: Goals include to maximize quality of life and symptom management. 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 Osteomyelitis-patient states he is uncertain if he will proceed surgery for mandible debridement with possible trach and pectoralis flap. He is to follow up with ENT as scheduled; he  would like to speak with them again prior to making a decision. He continues to have pain; will send script for refill for Oxycodone 5mg  every 8 hours PRN. Continue gabapentin PRN pain. Follow up with oncology as scheduled.   Anxiety- continue lorazepam every 12 hours PRN. Script sent.   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 States he occasionally forgets to take. Encourage using a pill box. Education provided on needing to take due to requiring chronic anticoagulation. He verbalizes understanding.  Follow up Palliative Care Visit: Palliative care will continue to follow for complex medical decision making, advance care planning, and clarification of goals. Return in 4 weeks or prn.  I spent 25 minutes providing this consultation. More than 50% of the time in this consultation was spent in counseling and care coordination.   PPS: 50%  HOSPICE ELIGIBILITY/DIAGNOSIS: TBD  Chief Complaint: Palliative Medicine follow up visit.  HISTORY OF PRESENT ILLNESS:  [name] [name] is a 60 y.o. year old male  with osteomyelitis of left jaw, squamous cell carcinoma of left tonsil,  hypertension, hyperlipidemia, hx of DVT and PE.   Patient reports doing well. He states his pain is managed with current regimen of the oxycodone. Takes gabapentin PRN. He has started the Xarelto, but states he sometimes forgets to take it. Denies any falls. Appetite is fair; drinking one supplement a day. He is eating most soft/liquid diet. He states he is still undecided if he will  have the surgery. Plans to see his oncologist this month. He does report occasional anxiety; takes lorazepam with effectiveness. He expresses he recently loss his brother unexpectedly. A 10-point review of systems is negative, except for the pertinent positives and negatives detailed in the HPI.  History obtained from review of EMR, discussion with primary team, and interview with family, facility staff/caregiver and/or [name] [name].  I reviewed available  labs, medications, imaging, studies and related documents from the EMR.  Records reviewed and summarized above.    Physical Exam: Constitutional: NAD General: frail appearing, WNWD EYES: anicteric sclera, lids intact, no discharge  ENMT: intact hearing, oral mucous membranes moist Pulmonary: no increased work of breathing, no cough, room air GU: deferred MSK:  moves all extremities, ambulatory Skin: no rashes or wounds on visible skin Neuro: generalized weakness, A & O x 3 Psych: non-anxious affect, pleasant Hem/lymph/immuno: no widespread bruising   Thank you for the opportunity to participate in the care of [name] [name].  The palliative care team will continue to follow. Please call our office at [phone] if we can be of additional assistance.   Ezekiel Slocumb, NP   COVID-19 PATIENT SCREENING TOOL Asked and negative response unless otherwise noted:   Have you had symptoms of covid, tested positive or been in contact with someone with symptoms/positive test in the past 5-10 days? No

## 2021-07-08 ENCOUNTER — Telehealth: Payer: Self-pay | Admitting: *Deleted

## 2021-07-08 NOTE — Telephone Encounter (Signed)
From [employer] - "can we reschedule this for an afternoon appointment. like 2 or 3 pm. I would also like to get a prescription for warafan, 10 mg, if possible. Thank you."  Per pt's chart, pt is not on Warfarin. I called and spoke to pt - informed he's on Xarelto not Warfarin. Pt stated he would like to go back on Warfarin - informed he needs to discuss thei with the doctor. Pt has an appt with his PCP on 08/06/21 @ 1445 PM he stated will talk to his doctor at that date.

## 2021-07-08 NOTE — Telephone Encounter (Signed)
Called pt - no answer; left message on self identified vm to call his oncologist about warfarin vs xarelto. And to call back for any questions.

## 2021-07-24 ENCOUNTER — Other Ambulatory Visit: Payer: Self-pay

## 2021-07-24 ENCOUNTER — Other Ambulatory Visit: Payer: Medicaid Other | Admitting: Student

## 2021-07-24 DIAGNOSIS — Z515 Encounter for palliative care: Secondary | ICD-10-CM

## 2021-07-24 DIAGNOSIS — K047 Periapical abscess without sinus: Secondary | ICD-10-CM

## 2021-07-24 NOTE — Progress Notes (Signed)
[facility] Consult Note Telephone: [phone]  Fax: [phone]    Date of encounter: 07/24/21 4:42 PM PATIENT NAME: [name] [name] [place] [address]   [phone] (home)  DOB: 1961-02-19 MRN: [id number] PRIMARY CARE PROVIDER:    [name], MD,  [address] [place] [address] [phone]  REFERRING PROVIDER:   [name], MD [address] [place],  [place] [address] [phone]  RESPONSIBLE PARTY:    Contact Information     Name Relation Home Work Mobile   [name] [name],[name] [name] Significant other [place] Daughter   [phone]        Due to the COVID-19 crisis, this visit was done via telemedicine from my office and it was initiated and consent by this patient and or family.  I connected with  [name] [name] OR PROXY on 07/24/2021 by a video enabled telemedicine application and verified that I am speaking with the correct person using two identifiers.   I discussed the limitations of evaluation and management by telemedicine. The patient expressed understanding and agreed to proceed.                                    ASSESSMENT AND PLAN / RECOMMENDATIONS:   Advance Care Planning/Goals of Care: Goals include to maximize quality of life and symptom management.  CODE STATUS: Full Code  Symptom Management/Plan:  Right Tooth infection-Amoxicillin 500mg  TID x 7 days; salt water gargles PRN, Apply ice pack to jaw line PRN. Given his history of left jaw osteomyelitis, he is encouraged to go to urgent care, should symptoms worsen. He is also advised to follow up with ENT as soon as possible.   Follow up Palliative Care Visit: Palliative care will continue to follow for complex medical decision making, advance care planning, and clarification of goals. Return in 2 weeks or prn.   This visit was coded based on medical decision making (MDM).  PPS:  50%  HOSPICE ELIGIBILITY/DIAGNOSIS: TBD  Chief Complaint: palliative medicine visit.   HISTORY OF PRESENT ILLNESS:  [name] [name] is a 61 y.o. year old male  with osteomyelitis of left jaw, squamous cell carcinoma of left tonsil,  hypertension, hyperlipidemia, hx of DVT and PE.    Patient reports having pain to right lower teeth, right jaw is swollen. He reports pain with chewing, biting down the past couple of days. No fever, chills, pus. He has multiple teeth in ill repair. He states his pain has been managed with current regimen of oxycodone. He is eating a mostly soft, liquid diet. A 10-point review of systems is negative, except for the pertinent positives and negatives detailed in the HPI.  History obtained from review of EMR, discussion with primary team, and interview with family, facility staff/caregiver and/or [name].  I reviewed available labs, medications, imaging, studies and related documents from the EMR.  Records reviewed and summarized above.    Physical Exam: Constitutional: NAD General: frail appearing  EYES: anicteric sclera, lids intact, no discharge  ENMT: intact hearing, dentition in ill repair, decayed or missing teeth CV: S1S2, RRR, no LE edema Pulmonary: no increased work of breathing, no cough, room air GU: deferred MSK: moves all extremities, ambulatory Skin: no rashes on visible skin Neuro: A & O x 3 Psych: non-anxious affect Hem/lymph/immuno: no widespread bruising  Thank you for the opportunity to participate in the care of [name].  The palliative care team will continue to follow. Please call our office at [phone] if we can be of additional assistance.   Ezekiel Slocumb, NP   COVID-19 PATIENT SCREENING TOOL Asked and negative response unless otherwise noted:   Have you had symptoms of covid, tested positive or been in contact with someone with symptoms/positive test in the past 5-10 days? No

## 2021-07-31 ENCOUNTER — Other Ambulatory Visit: Payer: Medicaid Other | Admitting: Student

## 2021-07-31 ENCOUNTER — Other Ambulatory Visit: Payer: Self-pay

## 2021-07-31 DIAGNOSIS — M272 Inflammatory conditions of jaws: Secondary | ICD-10-CM

## 2021-07-31 DIAGNOSIS — Z515 Encounter for palliative care: Secondary | ICD-10-CM

## 2021-07-31 DIAGNOSIS — I82409 Acute embolism and thrombosis of unspecified deep veins of unspecified lower extremity: Secondary | ICD-10-CM

## 2021-07-31 DIAGNOSIS — K047 Periapical abscess without sinus: Secondary | ICD-10-CM

## 2021-07-31 NOTE — Progress Notes (Signed)
[facility] Consult Note Telephone: [phone]  Fax: [phone]    Date of encounter: 07/31/21 2:11 PM PATIENT NAME: [name] [name] [address]   [phone] (home)  DOB: 04-Jun-1961 MRN: [id number] PRIMARY CARE PROVIDER:    [name], MD,  [address] [place] [address] [phone]  REFERRING PROVIDER:   [name], MD [address],  [place] [address] [phone]  RESPONSIBLE PARTY:    Contact Information     Name Relation Home Work Mobile   [place] Significant other [place] Daughter   [phone]        I met face to face with patient and family in the home. Palliative Care was asked to follow this patient by consultation request of  [name], MD to address advance care planning and complex medical decision making. This is a follow up visit.                                   ASSESSMENT AND PLAN / RECOMMENDATIONS:   Advance Care Planning/Goals of Care: Goals include to maximize quality of life and symptom management. Patient/health care surrogate gave his/her permission to discuss. Our advance care planning conversation included a discussion about:    The value and importance of advance care planning  Experiences with loved ones who have been seriously ill or have died  Exploration of personal, cultural or spiritual beliefs that might influence medical decisions  Exploration of goals of care in the event of a sudden injury or illness  CODE STATUS: Full Code   Symptom Management/Plan:  Right Tooth infection-Continue Amoxicillin 532m TID x 3 more days to = 10 days. Patient to see his PCP early next week.  Recommend salt water gargles PRN, Apply ice pack to jaw line PRN. Given his history of left jaw osteomyelitis, he is encouraged to go to urgent care, should symptoms worsen. He is also advised to follow up  with ENT as soon as possible.  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 Osteomyelitis-patient expresses hesitancy with proceeding with surgery for mandible debridement with possible trach and pectoralis flap. He has received a second opinion but would like to again speak with ENT again prior to making a decision. He states he is rarely smoking. He continues to have chronic pain related to osteomyelitis; will send script for refill for Oxycodone 533mevery 8 hours PRN. Continue gabapentin PRN pain.  Recurrent DVT's-patient states he has started Xarelto, but has not taken routinely. He states he would like to go back to warfarin and wants to speak with his PCP about this. Education provided on both medications and his need to take routinely given his history of recurrent DVT's. [place]. He would require chronic anticoagulation. He verbalizes understanding.  Follow up Palliative Care Visit: Palliative care will continue to follow for complex medical decision making, advance care planning, and clarification of goals. Return in 4-6 weeks or prn.   This visit was coded based on medical decision making (MDM).  PPS: 50%  HOSPICE ELIGIBILITY/DIAGNOSIS: TBD  Chief Complaint: Palliative Medicine follow up visit.   HISTORY OF PRESENT ILLNESS:  [name] BURBACHs a [age].o. year old male  with  osteomyelitis of left jaw, squamous cell carcinoma of left tonsil,  hypertension, hyperlipidemia, hx of DVT and PE.   Has an upcoming appointment with internal medicine on 1/18. He still has some swelling to right jaw line. Hasn't been chewing on the right side; eating mostly soft or liquids. . Pain is consistent to left jaw. Gabapentin PRN pain. Has not been taking Xarelto consistently.  States he wants to go back on warfarin. 10-point review of systems is negative, except for the pertinent positives and negatives detailed in the HPI.  History obtained from  review of EMR, discussion with primary team, and interview with family, facility staff/caregiver and/or [name] [name].  I reviewed available labs, medications, imaging, studies and related documents from the EMR.  Records reviewed and summarized above.    Physical Exam:  Pulse 68, resp 18, b/p 120/80, sats 97% on room air.  Constitutional: NAD General: frail appearing EYES: anicteric sclera, lids intact, no discharge  ENMT: intact hearing, oral mucous membranes moist, ill dentition, swelling to right jaw CV: S1S2, RRR, no LE edema Pulmonary: LCTA, no increased work of breathing, no cough, room air Abdomen: normo-active BS + 4 quadrants, soft and non tender, no ascites GU: deferred MSK: moves all extremities, ambulatory Skin: warm and dry, no rashes or wounds on visible skin Neuro:  no generalized weakness, A & O x 3 Psych: non-anxious affect Hem/lymph/immuno: no widespread bruising   Thank you for the opportunity to participate in the care of [name] [name].  The palliative care team will continue to follow. Please call our office at [phone] if we can be of additional assistance.   Ezekiel Slocumb, NP   COVID-19 PATIENT SCREENING TOOL Asked and negative response unless otherwise noted:   Have you had symptoms of covid, tested positive or been in contact with someone with symptoms/positive test in the past 5-10 days? No

## 2021-08-06 ENCOUNTER — Encounter: Payer: Medicaid Other | Admitting: Internal Medicine

## 2021-08-06 ENCOUNTER — Other Ambulatory Visit: Payer: Self-pay | Admitting: Internal Medicine

## 2021-08-06 DIAGNOSIS — Z7189 Other specified counseling: Secondary | ICD-10-CM

## 2021-08-07 NOTE — Telephone Encounter (Signed)
Next appt scheduled 06/24/22 with PCP. 

## 2021-08-14 ENCOUNTER — Ambulatory Visit: Payer: Medicaid Other | Admitting: Internal Medicine

## 2021-08-14 VITALS — BP 120/88 | HR 98 | Temp 97.6°F | Wt 239.3 lb

## 2021-08-14 DIAGNOSIS — E782 Mixed hyperlipidemia: Secondary | ICD-10-CM | POA: Diagnosis not present

## 2021-08-14 DIAGNOSIS — E039 Hypothyroidism, unspecified: Secondary | ICD-10-CM

## 2021-08-14 DIAGNOSIS — I10 Essential (primary) hypertension: Secondary | ICD-10-CM

## 2021-08-14 DIAGNOSIS — E785 Hyperlipidemia, unspecified: Secondary | ICD-10-CM

## 2021-08-14 DIAGNOSIS — C090 Malignant neoplasm of tonsillar fossa: Secondary | ICD-10-CM | POA: Diagnosis not present

## 2021-08-14 DIAGNOSIS — I82409 Acute embolism and thrombosis of unspecified deep veins of unspecified lower extremity: Secondary | ICD-10-CM | POA: Diagnosis not present

## 2021-08-14 DIAGNOSIS — Z23 Encounter for immunization: Secondary | ICD-10-CM | POA: Diagnosis not present

## 2021-08-14 DIAGNOSIS — Y842 Radiological procedure and radiotherapy as the cause of abnormal reaction of the patient, or of later complication, without mention of misadventure at the time of the procedure: Secondary | ICD-10-CM

## 2021-08-14 DIAGNOSIS — I7121 Aneurysm of the ascending aorta, without rupture: Secondary | ICD-10-CM | POA: Diagnosis not present

## 2021-08-14 DIAGNOSIS — F5101 Primary insomnia: Secondary | ICD-10-CM | POA: Diagnosis not present

## 2021-08-14 DIAGNOSIS — L598 Other specified disorders of the skin and subcutaneous tissue related to radiation: Secondary | ICD-10-CM | POA: Diagnosis not present

## 2021-08-14 DIAGNOSIS — I739 Peripheral vascular disease, unspecified: Secondary | ICD-10-CM | POA: Diagnosis not present

## 2021-08-14 DIAGNOSIS — F17200 Nicotine dependence, unspecified, uncomplicated: Secondary | ICD-10-CM

## 2021-08-14 DIAGNOSIS — I712 Thoracic aortic aneurysm, without rupture, unspecified: Secondary | ICD-10-CM

## 2021-08-14 DIAGNOSIS — Z0000 Encounter for general adult medical examination without abnormal findings: Secondary | ICD-10-CM

## 2021-08-14 MED ORDER — RIVAROXABAN 20 MG PO TABS: 20.0000 mg | ORAL_TABLET | Freq: Every day | ORAL | 3 refills | Status: DC

## 2021-08-14 NOTE — Patient Instructions (Signed)
Mr.Jai Bethann Humble, it was a pleasure seeing you today!  Today we discussed:  Jaw necrosis -Follow up with ENT  Recurrent blood clots -Resume Xarelto  Aortic Aneurysm -Chest CT for ongoing monitoring  Peripheral artery Disease -Follow up at vascular surgery  Follow-up: 3-4 months   Please make sure to arrive 15 minutes prior to your next appointment. If you arrive late, you may be asked to reschedule.   We look forward to seeing you next time. Please call our clinic at [phone] if you have any questions or concerns. The best time to call is Monday-Friday from 9am-4pm, but there is someone available 24/7. If after hours or the weekend, call the main hospital number and ask for the Internal Medicine Resident On-Call. If you need medication refills, please notify your pharmacy one week in advance and they will send [place] a request.  Thank you for letting us take part in your care. Wishing you the best!

## 2021-08-15 LAB — LIPID PANEL
Chol/HDL Ratio: 3.5 ratio (ref 0.0–5.0)
Cholesterol, Total: 163 mg/dL (ref 100–199)
HDL: 47 mg/dL (ref >39)
LDL Chol Calc (NIH): 74 mg/dL (ref 0–99)
Triglycerides: 256 mg/dL — ABNORMAL HIGH (ref 0–149)
VLDL Cholesterol Cal: 42 mg/dL — ABNORMAL HIGH (ref 5–40)

## 2021-08-15 LAB — TSH: TSH: 17.4 u[IU]/mL — ABNORMAL HIGH (ref 0.450–4.500)

## 2021-08-16 ENCOUNTER — Encounter: Payer: Self-pay | Admitting: Internal Medicine

## 2021-08-16 DIAGNOSIS — Z0000 Encounter for general adult medical examination without abnormal findings: Secondary | ICD-10-CM | POA: Insufficient documentation

## 2021-08-16 DIAGNOSIS — F17200 Nicotine dependence, unspecified, uncomplicated: Secondary | ICD-10-CM | POA: Insufficient documentation

## 2021-08-16 DIAGNOSIS — I700 Atherosclerosis of aorta: Secondary | ICD-10-CM | POA: Insufficient documentation

## 2021-08-16 DIAGNOSIS — Y842 Radiological procedure and radiotherapy as the cause of abnormal reaction of the patient, or of later complication, without mention of misadventure at the time of the procedure: Secondary | ICD-10-CM | POA: Insufficient documentation

## 2021-08-16 DIAGNOSIS — I739 Peripheral vascular disease, unspecified: Secondary | ICD-10-CM | POA: Insufficient documentation

## 2021-08-16 DIAGNOSIS — L598 Other specified disorders of the skin and subcutaneous tissue related to radiation: Secondary | ICD-10-CM | POA: Insufficient documentation

## 2021-08-16 NOTE — Assessment & Plan Note (Signed)
He has not been taking xarelto for a few months. He requests to be transitioned to warfarin. I explained that, due to his complicated clotting history and developing VTE while on warfarin, this would need to be discussed with Dr. Lorenso Courier. I recommended that he resume xarelto in the meantime which he is agreeable to.   Xarelto resent to pharmacy.  Follow up with Dr. Lorenso Courier

## 2021-08-16 NOTE — Assessment & Plan Note (Signed)
Has followed with vascular surgery in the past. He is having, what sounds like, claudication symptoms, of the LLE. Declines ABI's in the office today and would prefer to revisit with vascular surgery.  Referred to vascular surgery  Not a great candidate for antiplatelet therapy due to need for anticoagulation with xarelto  LDL is near goal--74. He needs to resume lipitor

## 2021-08-16 NOTE — Assessment & Plan Note (Signed)
Non-adherent to lipitor. Lipid panel today shows LDL of 74, HDL 47, total cholesterol 163. 10Y ASCVD risk 11.3%.   Encouraged adherence to medications, continue lipitor 20mg 

## 2021-08-16 NOTE — Assessment & Plan Note (Signed)
TSH is 17.4 today. He is a poor historian for medication reconciliation however dispense report suggests that he has not picked this up since June 2022 which was a 30d supply. He does not seem to have any overt hypothyroid symptoms although this is complicated by his other comorbidities.   Resume synthroid 101mcg daily  Recheck TSH in 6 weeks.

## 2021-08-16 NOTE — Assessment & Plan Note (Signed)
Chronic and stable. Labs from 02/2021 unremarkable. Continue metoprolol 25mg  daily.

## 2021-08-16 NOTE — Assessment & Plan Note (Signed)
Prevnar 20 and TDaP given in the office today. Declines influenza.

## 2021-08-16 NOTE — Progress Notes (Signed)
Office Visit   Patient ID: [name] [name], male    DOB: [birth year], 61 y.o.   MRN: [id number]   PCP: [name], MD   Subjective:   GEARALD STONEBRAKER is a 61 y.o. year old male with hypertension, hyperlipidemia, hypothyroidism, and history of stage 1 throat cancer now s/p chemoradiation complicated by osteomyelitis and radionecrosis of left jaw. He presents for follow up of above chronic medical issues.   His LOV with me was in July of 2020 for establishment of care. He had completed chemoradiation treatment one month prior and had noted that he was feeling better and being able to eat better.  His lipids were elevated so we started lipitor 20mg  He was also found to have an ascending aortic aneurysm on a prior chest CT of 4.7cm so he was referred to CT surgery.   Today, he reports frustration over the complications that he has faced including osteonecrosis and decreased vision for hyperbaric therapy. His diet is very limited by the inability for him to open his jaw. His only intake typically is carnation drinks.  He has sought 2nd, 3rd, and 4th opinions from ENTs including at [facility], [facility], and [facility], regarding surgical management of the osteonecrosis. He notes that they have all recommended some sort of tissue flap, albeit slight variations thereof. He is hesitant to proceed with any of these due to the complications that have resulted from alternative treatments and the liklihood that he may end up needing a tracheostomy temporarily. He feels that he wants a better picture of what the surgery would entail and why they would use a particular type of surgery.   He endorses claudication symptoms in his right leg. Offered ABIs in the office which he declined, stating he would rather just go to vascular surgery.   He notes that he stopped taking xarelto a few months ago and requests to go back to using warfarin. I explained that, given his complicated history of recurrent VTEs while on eliquis, warfarin,  and lovenox, that he needs to discuss this with his oncologist.    [place]   Outpatient Medications Prior to Visit  Medication Sig   atorvastatin (LIPITOR) 20 MG tablet Take 1 tablet (20 mg total) by mouth daily.   cyclobenzaprine (FLEXERIL) 10 MG tablet TAKE 1 TABLET BY MOUTH THREE TIMES A DAY AS NEEDED FOR MUSCLE SPASMS   gabapentin (NEURONTIN) 100 MG capsule Take 100 mg by mouth every 8 (eight) hours as needed.   levothyroxine (SYNTHROID) 25 MCG tablet Take 1 tablet (25 mcg total) by mouth daily before breakfast. Take on empty stomach w/ water, 1 hour before other meds or food.   metoprolol succinate (TOPROL-XL) 25 MG 24 hr tablet TAKE 1 TABLET (25 MG TOTAL) BY MOUTH DAILY.   oxyCODONE (OXY IR/ROXICODONE) 5 MG immediate release tablet Take 5 mg by mouth every 8 (eight) hours as needed.   sodium fluoride (PREVIDENT 5000 PLUS) 1.1 % CREA dental cream Apply to tooth brush. Brush teeth for 2 minutes. Spit out excess. DO NOT rinse afterwards. Repeat nightly.   vitamin E 180 MG (400 UNITS) capsule Take 400 IU daily x 1 week, then 400 IU BID   [DISCONTINUED] enoxaparin (LOVENOX) 100 MG/ML injection Inject 1 mL  into the skin every 12  hours.   [DISCONTINUED] ibuprofen (ADVIL) 200 MG tablet Take 600 mg by mouth every 6 (six) hours as needed for pain or fever.    [DISCONTINUED] RIVAROXABAN (XARELTO) VTE STARTER PACK (15 &  20 MG) Follow package directions: Take one 15mg  tablet by mouth twice a day. On day 22, switch to one 20mg  tablet once a day. Take with food.   No facility-administered medications prior to visit.     Objective:   BP 120/88 (BP Location: Left Arm, Patient Position: Sitting, Cuff Size: Normal)    Pulse 98    Temp 97.6 F (36.4 C) (Oral)    Wt 239 lb 4.8 oz (108.5 kg)    SpO2 99%    BMI 29.91 kg/m  Wt Readings from Last 3 Encounters:  08/14/21 239 lb 4.8 oz (108.5 kg)  02/21/21 233 lb 1.6 oz (105.7 kg)  01/17/21 231 lb 2 oz (104.8 kg)    BP Readings from Last 3  Encounters:  08/14/21 120/88  02/21/21 (!) 125/97  01/17/21 117/83   General: chronically ill appearing in no distress HEENT: deformity of the left mandibular area with prior scar, no open wound or drainage. Tender on palpation. Poor dental hygeine. Significantly limited in opening mouth. Unable to appreciate any swelling over the right mandible and no obvious visible abscess although vision limited by jaw ROM.  Cardiac: RRR, no LE edema, legs are shiny and hairless, declines pedal pulse check Pulm: breathing comfortably on room air, lungs clear throughout GI: soft, non-tender Psych: normal mood and affect  Assessment & Plan:     Benign essential HTN (Chronic)    Chronic and stable. Labs from 02/2021 unremarkable. Continue metoprolol 25mg  daily.      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 He requests to be transitioned to warfarin. I explained that, due to his complicated clotting history and developing VTE while on warfarin, this would need to be discussed with Dr. Lorenso Courier. I recommended that he resume xarelto in the meantime which he is agreeable to.  Xarelto resent to pharmacy. Follow up with Dr. Lorenso Courier      Thoracic ascending aortic aneurysm (Chronic)    He has not followed up with CT surgery yet. He had an appointment with Dr. Levell July when I had initially referred him back in 2020 however this ended up getting delayed due to his jaw complications.  Repeat gated CTA aorta  Referred to CT surgery Continue metoprolol      PAD (peripheral artery disease) (HCC) (Chronic)    Has followed with vascular surgery in the past. He is having, what sounds like, claudication symptoms, of the LLE. Declines ABI's in the office today and would prefer to revisit with vascular surgery. Referred to vascular surgery Not a great candidate for antiplatelet therapy due to need for anticoagulation with xarelto LDL is near goal--74. He  needs to resume lipitor      Hypothyroidism (acquired) - Primary (Chronic)    TSH is 17.4 today. He is a poor historian for medication reconciliation however dispense report suggests that he has not picked this up since June 2022 which was a 30d supply. He does not seem to have any overt hypothyroid symptoms although this is complicated by his other comorbidities.  Resume synthroid 63mcg daily Recheck TSH in 6 weeks.       Hyperlipidemia, mixed (Chronic)    Non-adherent to lipitor. Lipid panel today shows LDL of 74, HDL 47, total cholesterol 163. 10Y ASCVD risk 11.3%.  Encouraged adherence to medications, continue lipitor 20mg       Radionecrosis of left mandible (Chronic)    Complicated by chronic osteomyelitis of the  left jaw. Has been followed by ID for infectious component. Has seen ENTs at [facility], [facility], and [facility] who recommend reconstructive surgery via muscle flap or some variation thereof. He remains hesitant to proceed with surgery. No fevers, chills, or change in jaw pain to suggest new infection although exam is very limited by poor jaw mobility.  I encouraged him to follow up with one of the ENTs to revisit surgical management.      Healthcare maintenance    Prevnar 20 and TDaP given in the office today. Declines influenza.        Return in about 3 months (around 11/18/2021).   Pt discussed with Dr. Elwanda Brooklyn, MD Internal Medicine Resident PGY-3 [facility] 08/16/2021 10:39 AM

## 2021-08-16 NOTE — Assessment & Plan Note (Signed)
Complicated by chronic osteomyelitis of the left jaw. Has been followed by ID for infectious component. Has seen ENTs at [facility], [facility], and [facility] who recommend reconstructive surgery via muscle flap or some variation thereof. He remains hesitant to proceed with surgery. No fevers, chills, or change in jaw pain to suggest new infection although exam is very limited by poor jaw mobility.  I encouraged him to follow up with one of the ENTs to revisit surgical management.

## 2021-08-16 NOTE — Assessment & Plan Note (Addendum)
He has not followed up with CT surgery yet. He had an appointment with Dr. Levell July when I had initially referred him back in 2020 however this ended up getting delayed due to his jaw complications.   Repeat gated CTA aorta   Referred to CT surgery

## 2021-08-16 NOTE — Assessment & Plan Note (Signed)
Reports decreased tobacco use from 2 packs to 0.5 packs. Continue Chantix.

## 2021-08-18 DIAGNOSIS — G4700 Insomnia, unspecified: Secondary | ICD-10-CM | POA: Insufficient documentation

## 2021-08-18 MED ORDER — SENNOSIDES-DOCUSATE SODIUM 8.6-50 MG PO TABS: 1.0000 | ORAL_TABLET | Freq: Every day | ORAL | 5 refills | Status: DC

## 2021-08-18 MED ORDER — ATORVASTATIN CALCIUM 20 MG PO TABS: 20.0000 mg | ORAL_TABLET | Freq: Every day | ORAL | 11 refills | Status: DC

## 2021-08-18 MED ORDER — LEVOTHYROXINE SODIUM 25 MCG PO TABS: 25.0000 ug | ORAL_TABLET | Freq: Every day | ORAL | 1 refills | Status: DC

## 2021-08-18 MED ORDER — TRAZODONE HCL 50 MG PO TABS: 50.0000 mg | ORAL_TABLET | Freq: Every day | ORAL | 1 refills | Status: DC

## 2021-08-18 NOTE — Addendum Note (Signed)
Addended by: [name] on: 08/18/2021 09:07 AM   Modules accepted: Orders

## 2021-08-18 NOTE — Progress Notes (Signed)
Internal Medicine Clinic Attending  Case discussed with Dr. Christian  At the time of the visit.  We reviewed the resident's history and exam and pertinent patient test results.  I agree with the assessment, diagnosis, and plan of care documented in the resident's note.  

## 2021-08-27 ENCOUNTER — Inpatient Hospital Stay: Payer: Medicaid Other | Attending: Hematology and Oncology | Admitting: Hematology and Oncology

## 2021-08-27 ENCOUNTER — Other Ambulatory Visit: Payer: Self-pay

## 2021-08-27 ENCOUNTER — Inpatient Hospital Stay: Payer: Medicaid Other

## 2021-08-27 ENCOUNTER — Other Ambulatory Visit: Payer: Self-pay | Admitting: Hematology and Oncology

## 2021-08-27 VITALS — BP 120/89 | HR 84 | Temp 97.0°F | Resp 16 | Wt 241.3 lb

## 2021-08-27 DIAGNOSIS — C090 Malignant neoplasm of tonsillar fossa: Secondary | ICD-10-CM | POA: Diagnosis not present

## 2021-08-27 DIAGNOSIS — I82409 Acute embolism and thrombosis of unspecified deep veins of unspecified lower extremity: Secondary | ICD-10-CM | POA: Diagnosis not present

## 2021-08-27 DIAGNOSIS — Z7901 Long term (current) use of anticoagulants: Secondary | ICD-10-CM | POA: Diagnosis not present

## 2021-08-27 DIAGNOSIS — F17210 Nicotine dependence, cigarettes, uncomplicated: Secondary | ICD-10-CM | POA: Diagnosis not present

## 2021-08-27 DIAGNOSIS — B370 Candidal stomatitis: Secondary | ICD-10-CM | POA: Insufficient documentation

## 2021-08-27 DIAGNOSIS — I82402 Acute embolism and thrombosis of unspecified deep veins of left lower extremity: Secondary | ICD-10-CM | POA: Diagnosis not present

## 2021-08-27 DIAGNOSIS — I82621 Acute embolism and thrombosis of deep veins of right upper extremity: Secondary | ICD-10-CM | POA: Insufficient documentation

## 2021-08-27 DIAGNOSIS — C099 Malignant neoplasm of tonsil, unspecified: Secondary | ICD-10-CM | POA: Diagnosis present

## 2021-08-27 DIAGNOSIS — E038 Other specified hypothyroidism: Secondary | ICD-10-CM | POA: Diagnosis not present

## 2021-08-27 DIAGNOSIS — Z79899 Other long term (current) drug therapy: Secondary | ICD-10-CM | POA: Diagnosis not present

## 2021-08-27 LAB — CMP (CANCER CENTER ONLY)
ALT: 29 U/L (ref 0–44)
AST: 48 U/L — ABNORMAL HIGH (ref 15–41)
Albumin: 4.3 g/dL (ref 3.5–5.0)
Alkaline Phosphatase: 83 U/L (ref 38–126)
Anion gap: 8 (ref 5–15)
BUN: 12 mg/dL (ref 8–23)
CO2: 25 mmol/L (ref 22–32)
Calcium: 9.6 mg/dL (ref 8.9–10.3)
Chloride: 103 mmol/L (ref 98–111)
Creatinine: 0.93 mg/dL (ref 0.61–1.24)
GFR, Estimated: >60 mL/min (ref >60)
Glucose, Bld: 97 mg/dL (ref 70–99)
Potassium: 3.7 mmol/L (ref 3.5–5.1)
Sodium: 136 mmol/L (ref 135–145)
Total Bilirubin: 0.7 mg/dL (ref 0.3–1.2)
Total Protein: 7.3 g/dL (ref 6.5–8.1)

## 2021-08-27 LAB — CBC WITH DIFFERENTIAL (CANCER CENTER ONLY)
Abs Immature Granulocytes: 0.01 10*3/uL (ref 0.00–0.07)
Basophils Absolute: 0 10*3/uL (ref 0.0–0.1)
Basophils Relative: 1 %
Eosinophils Absolute: 0.2 10*3/uL (ref 0.0–0.5)
Eosinophils Relative: 4 %
HCT: 46.2 % (ref 39.0–52.0)
Hemoglobin: 15.9 g/dL (ref 13.0–17.0)
Immature Granulocytes: 0 %
Lymphocytes Relative: 26 %
Lymphs Abs: 1.4 10*3/uL (ref 0.7–4.0)
MCH: 32.8 pg (ref 26.0–34.0)
MCHC: 34.4 g/dL (ref 30.0–36.0)
MCV: 95.3 fL (ref 80.0–100.0)
Monocytes Absolute: 0.6 10*3/uL (ref 0.1–1.0)
Monocytes Relative: 11 %
Neutro Abs: 3.1 10*3/uL (ref 1.7–7.7)
Neutrophils Relative %: 58 %
Platelet Count: 155 10*3/uL (ref 150–400)
RBC: 4.85 MIL/uL (ref 4.22–5.81)
RDW: 13.5 % (ref 11.5–15.5)
WBC Count: 5.3 10*3/uL (ref 4.0–10.5)
nRBC: 0 % (ref 0.0–0.2)

## 2021-08-27 MED ORDER — CYCLOBENZAPRINE HCL 10 MG PO TABS: ORAL_TABLET | ORAL | 0 refills | Status: DC

## 2021-08-27 NOTE — Progress Notes (Signed)
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 Benjamine Mola, invasive SCCa, p16+; not a candidate for TORS -Late 10/2018 - 12/2018: definitive chemoradiation with weekly cisplatin  03/2019: end-of-treatment PET showed decrease in FDG avidity in the left tonsil but some residual soft tissue fullness, resolution of left LN disease   -2021:  Diffuse pharyngeal soft tissue thickening at the tongue base on multiple CT's  Necrotic tissue on multiple biopsies at [facility], suggestive of radionecrosis; no evidence of malignancy    #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 -12/2018: acute DVT involving R peroneal vein and L gastrocnemius vein; new arterial thrombosis involving R common femoral artery and popliteal arteries, due to artery aneurysm -Late 12/2018: progression of acute DVT in the LLE from gastrocnemius vein to the femoral, popliteal, posterior tibial and peroneal veins despite being on Eliquis  -Currently on warfarin  11/25/2020: Developed a right upper extremity deep and superficial vein thromboses secondary to line placement.  Started on Lovenox 1 mg/kg every 12  hours. 02/21/2021: Transition to Xarelto therapy.  Interval History:  [name] [name] 61 y.o. male reports he has been well overall interim since her last visit.  He has been tolerating Xarelto therapy without any difficulty.  He has had no signs or symptoms concerning for a residual or recurrent clot.  He reports that he has been taking his Xarelto "religiously" for the last several weeks.  He notes that he continues to have jaw stiffness and has difficulty opening his mouth.  He is requesting a refill of his Flexeril.  He also reports that his energy levels are good and is not having a trouble with signs or symptoms of hypothyroidism.  He denies any fevers, chills, night sweats, shortness of breath, chest pain or cough. He has no other complaints. A full 10 point ROS is listed below.  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   Right SFA 05/07/17 while on warfarin INR 2.9   Benign essential HTN 01/04/2012   Cancer ([place])    tonsilar   Chronic anticoagulation 01/02/2013   Dermatofibroma of forearm 01/02/2013   Left side   DVT, lower extremity, proximal ([place]) 05/24/2013   Right leg femoral & popliteal 05/24/13   Hyperlipidemia, mixed 01/04/2012   Polycythemia secondary to smoking 01/04/2012   Primary hypercoagulable state ([place]) 10/01/2014   Sinus bradycardia, chronic 01/04/2012   Superficial thrombosis of lower extremity 05/02/2012    SURGICAL HISTORY: Past Surgical History:  Procedure Laterality Date   DIRECT  LARYNGOSCOPY Left 10/19/2018   Procedure: DIRECT LARYNGOSCOPY;  Surgeon: Leta Baptist, MD;  Location: [place];  Service: ENT;  Laterality: Left;   IR IMAGING GUIDED PORT INSERTION  11/04/2018   IR  REMOVAL TUN ACCESS W/ PORT W/O FL MOD SED  05/01/2019   TONSILLECTOMY Left 10/19/2018   Procedure: BIOPSY OF LEFT TONSIL;  Surgeon: Leta Baptist, MD;  Location: [place];  Service: ENT;  Laterality: Left;    SOCIAL HISTORY: Social History   Socioeconomic History   Marital status: Divorced    Spouse name: Not on file   Number of children: 2   Years of education: Not on file   Highest education level: Not on file  Occupational History   Not on file  Tobacco Use   Smoking status: Light Smoker    Packs/day: 0.50    Years: 30.00    Pack years: 15.00    Types: Cigarettes    Last attempt to quit: 10/19/2018    Years since quitting: 2.8   Smokeless tobacco: Never  Vaping Use   Vaping Use: Former   Substances: Nicotine, Flavoring   Devices: 1 cartridge q2-3 days, he tells me he is not using.   Substance and Sexual Activity   Alcohol use: Yes    Alcohol/week: 13.0 - 14.0 standard drinks    Types: 12 Cans of beer, 1 - 2 Shots of liquor per week    Comment: 1-2 times per week.   Drug use: No   Sexual activity: Not on file  Other Topics Concern   Not on file  Social History Narrative   Not on file   Social Determinants of Health   Financial Resource Strain: Not on file  Food Insecurity: Not on file  Transportation Needs: Not on file  Physical Activity: Not on file  Stress: Not on file  Social Connections: Not on file  Intimate Partner Violence: Not on file    FAMILY HISTORY: Family History  Problem Relation Age of Onset   Stroke Father     ALLERGIES:  has No Known Allergies.  MEDICATIONS:  Current Outpatient Medications  Medication Sig Dispense Refill   atorvastatin (LIPITOR) 20 MG tablet Take 1 tablet (20 mg total) by mouth daily. 90 tablet 11   cyclobenzaprine (FLEXERIL) 10 MG tablet TAKE 1 TABLET BY MOUTH THREE TIMES A DAY AS NEEDED FOR MUSCLE SPASMS 30 tablet 0   gabapentin (NEURONTIN) 100 MG capsule Take 100 mg by mouth every 8 (eight) hours as  needed.     levothyroxine (SYNTHROID) 25 MCG tablet Take 1 tablet (25 mcg total) by mouth daily before breakfast. Take on empty stomach w/ water, 1 hour before other meds or food. 90 tablet 1   metoprolol succinate (TOPROL-XL) 25 MG 24 hr tablet TAKE 1 TABLET (25 MG TOTAL) BY MOUTH DAILY. 90 tablet 1   oxyCODONE (OXY IR/ROXICODONE) 5 MG immediate release tablet Take 5 mg by mouth every 8 (eight) hours as needed.     rivaroxaban (XARELTO) 20 MG TABS tablet Take 1 tablet (20 mg total) by mouth daily with supper. 90 tablet 3   senna-docusate (SENOKOT-S) 8.6-50 MG tablet Take 1 tablet by mouth daily. 90 tablet 5   sodium fluoride (PREVIDENT 5000 PLUS) 1.1 % CREA dental cream Apply to tooth brush. Brush teeth for 2 minutes. Spit out excess. DO NOT rinse afterwards. Repeat nightly. 1 Tube prn   traZODone (DESYREL) 50 MG tablet Take 1 tablet (50 mg total) by mouth  at bedtime. 90 tablet 1   vitamin E 180 MG (400 UNITS) capsule Take 400 IU daily x 1 week, then 400 IU BID 60 capsule 5   No current facility-administered medications for this visit.    REVIEW OF SYSTEMS:   Constitutional: ( - ) fevers, ( - )  chills , ( - ) night sweats Eyes: ( - ) blurriness of vision, ( - ) double vision, ( - ) watery eyes Ears, nose, mouth, throat, and face: ( - ) mucositis, ( - ) sore throat Respiratory: ( - ) cough, ( - ) dyspnea, ( - ) wheezes Cardiovascular: ( - ) palpitation, ( - ) chest discomfort, ( - ) lower extremity swelling Gastrointestinal:  ( - ) nausea, ( - ) heartburn, ( - ) change in bowel habits Skin: ( - ) abnormal skin rashes Lymphatics: ( - ) new lymphadenopathy, ( - ) easy bruising Neurological: ( - ) numbness, ( - ) tingling, ( - ) new weaknesses Behavioral/Psych: ( - ) mood change, ( - ) new changes  All other systems were reviewed with the patient and are negative.  PHYSICAL EXAMINATION: ECOG PERFORMANCE STATUS: 1 - Symptomatic but completely ambulatory  Vitals:   08/27/21 1537  BP:  120/89  Pulse: 84  Resp: 16  Temp: (!) 97 F (36.1 C)  SpO2: 94%   Filed Weights   08/27/21 1537  Weight: 241 lb 4.8 oz (109.5 kg)    GENERAL: well appearing middle aged Caucasian male alert, no distress and comfortable SKIN: skin color, texture, turgor are normal, no rashes or significant lesions EYES: conjunctiva are pink and non-injected, sclera clear OROPHARYNX: exam limited as patient cannot open his mouth wide. Thick salivations noted  LUNGS: clear to auscultation and percussion with normal breathing effort HEART: regular rate & rhythm and no murmurs and no lower extremity edema Musculoskeletal: Mild edema involving the right upper extremity. PSYCH: alert & oriented x 3, fluent speech NEURO: no focal motor/sensory deficits  LABORATORY DATA:  I have reviewed the data as listed CBC Latest Ref Rng & Units 08/27/2021 02/21/2021 01/03/2021  WBC 4.0 - 10.5 K/uL 5.3 4.5 5.2  Hemoglobin 13.0 - 17.0 g/dL 15.9 16.1 16.0  Hematocrit 39.0 - 52.0 % 46.2 45.5 46.3  Platelets 150 - 400 K/uL 155 151 152    CMP Latest Ref Rng & Units 08/27/2021 02/21/2021 01/03/2021  Glucose 70 - 99 mg/dL 97 95 100(H)  BUN 8 - 23 mg/dL 12 9 9   Creatinine 0.61 - 1.24 mg/dL 0.93 0.89 1.04  Sodium 135 - 145 mmol/L 136 139 139  Potassium 3.5 - 5.1 mmol/L 3.7 4.3 4.2  Chloride 98 - 111 mmol/L 103 102 104  CO2 22 - 32 mmol/L 25 27 24   Calcium 8.9 - 10.3 mg/dL 9.6 10.2 10.0  Total Protein 6.5 - 8.1 g/dL 7.3 7.1 7.0  Total Bilirubin 0.3 - 1.2 mg/dL 0.7 0.9 0.6  Alkaline Phos 38 - 126 U/L 83 76 78  AST 15 - 41 U/L 48(H) 41 36  ALT 0 - 44 U/L 29 49(H) 38    RADIOGRAPHIC STUDIES: No results found.   ASSESSMENT & PLAN [name] [name] 61 y.o. male with medical history significant for squamous cell carcinoma of left tonsil presents for a follow up visit.    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 Lennox Laity,  ENT at [facility] -Multiple discussions were held with radiation oncology and ENT at [facility].  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 Patient holding on that plan at this time.  -RTC in August 2022 for a follow up.   #Hypothyroidisim, radiation induced --current on levothyroxine 26 mcg PO daily.  --TSH is 15.139 (H) and T4 is pending --Recommend to continue current dose of levothyroxine.   #Recurrent LLE DVT #Acute Right Upper Extremity DVT/SVT --patient had a midline placed for abx, developed a subsequent RUE DVT --this is considered provoked in the setting of a catheter, though patient is prone to clotting. --previously on therapeutic 1mg /kg lovenox x 3 months since May 2022. HOLD coumadin. -- Recommended the patient not return to Coumadin therapy --continue Xarelto 20mg  PO daily. Can consider transition to maintenance dose at next visit.  --Patient requires indefinite anticoagulation   #Persistent oral candidiasis -Despite aggressive antifungal therapy, including amphotericin, there was no significant improvement in oral candidiasis -Infectious disease felt that the oral candidiasis was noninfectious, and therefore does not warrant any further treatment at this time -I counseled the patient on the importance of maintaining good oral hygiene            No orders of the defined types were placed in this encounter.   All questions were answered. The patient knows to call the clinic with any  problems, questions or concerns.  I have spent a total of 30 minutes minutes of face-to-face and non-face-to-face time, preparing to see the patient, obtaining and/or reviewing separately obtained history, performing a medically appropriate examination, counseling and educating the patient, documenting clinical information in the electronic health record, and care coordination.   Ledell Peoples, MD Department of Hematology/Oncology [place] at [facility] Phone: [phone] Pager: [phone] Email: [email or web address]

## 2021-08-28 LAB — TSH: TSH: 15.139 u[IU]/mL — ABNORMAL HIGH (ref 0.320–4.118)

## 2021-09-01 ENCOUNTER — Other Ambulatory Visit: Payer: Self-pay

## 2021-09-01 ENCOUNTER — Other Ambulatory Visit: Payer: Medicaid Other | Admitting: Student

## 2021-09-01 DIAGNOSIS — C024 Malignant neoplasm of lingual tonsil: Secondary | ICD-10-CM

## 2021-09-01 DIAGNOSIS — I82409 Acute embolism and thrombosis of unspecified deep veins of unspecified lower extremity: Secondary | ICD-10-CM

## 2021-09-01 DIAGNOSIS — Z515 Encounter for palliative care: Secondary | ICD-10-CM

## 2021-09-01 DIAGNOSIS — M272 Inflammatory conditions of jaws: Secondary | ICD-10-CM

## 2021-09-01 DIAGNOSIS — G8929 Other chronic pain: Secondary | ICD-10-CM

## 2021-09-01 NOTE — Progress Notes (Addendum)
[facility] Consult Note Telephone: [phone]  Fax: [phone]    Date of encounter: 09/01/21 2:38 PM PATIENT NAME: [name] [name] [name] [name] [id number]   [phone] (home)  DOB: [birth year] MRN: [id number] PRIMARY CARE PROVIDER:    [name], MD,  [address] [place] [address] [phone]  REFERRING PROVIDER:   [name], MD [address] [place],  [place] [address] [phone]  RESPONSIBLE PARTY:    Contact Information     Name Relation Home Work Mobile   [name] [name],[name] [name] Significant other [place] Daughter   [phone]       Due to the COVID-19 crisis, this visit was done via telemedicine from my office and it was initiated and consent by this patient and or family.  I connected with  [name] [name] OR PROXY on 09/01/21 by a video enabled telemedicine application and verified that I am speaking with the correct person using two identifiers.   I discussed the limitations of evaluation and management by telemedicine. The patient expressed understanding and agreed to proceed.                                    ASSESSMENT AND PLAN / RECOMMENDATIONS:   Advance Care Planning/Goals of Care: Goals include to maximize quality of life and symptom management. Patient/health care surrogate gave his/her permission to discuss. 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 Follow up with oncology as  scheduled. Osteomyelitis-patient expresses hesitancy with proceeding with surgery for mandible debridement with possible trach and pectoralis flap. He has received a second opinion but would like to again speak with ENT again prior to making a decision. He has upcoming appointment with ENT in March.   Chronic pain-related to osteomyelitis; will send script for refill for Oxycodone 5mg  every 8 hours PRN. Continue gabapentin PRN pain.  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   Follow up Palliative Care Visit: Palliative care will continue to follow for complex medical decision making, advance care planning, and clarification of goals. Return in 4 weeks or prn.   This visit was coded based on medical decision making (MDM).  PPS: 50%  HOSPICE ELIGIBILITY/DIAGNOSIS: TBD  Chief Complaint: Palliative Medicine follow visit.   HISTORY OF PRESENT ILLNESS:  [name] [name] is a 61 y.o. year old male  with osteomyelitis of left jaw, squamous cell carcinoma of left tonsil, chronic pain, hypertension, hyperlipidemia, hx of DVT and PE.      Patient states his tooth infection and pain to right side of mouth has improved. He continues with chronic left sided jaw/mouth pain due to osteomyelitis. Feels that pain is managed well with current regimen. Secretions are about the same; suctions PRN. Feels the oxycodone also helps with excessive secretions. Appetite has been good; eating mostly soft foods, liquid diet. Weight is 141 pounds. He did see his PCP recently; his lorazepam was  stopped and added trazodone for sleep. He has started dulcolax for constipation; has been moving bowels regularly. He is now taking his Xarelto after discussing with PCP.  He was started on levothyroxine. Patient seen by oncology last week. A 10 point ROS was negative, except for the pertinent positives and negatives detailed in the HPI.   History obtained from review of EMR, discussion with primary team, and  interview with family, facility staff/caregiver and/or [name] [name].  I reviewed available labs, medications, imaging, studies and related documents from the EMR.  Records reviewed and summarized above.   Physical Exam:  PE deferred due to this being a Tele health visit.     Thank you for the opportunity to participate in the care of [name] [name].  The palliative care team will continue to follow. Please call our office at [phone] if we can be of additional assistance.   [name] Slocumb, NP   COVID-19 PATIENT SCREENING TOOL Asked and negative response unless otherwise noted:   Have you had symptoms of covid, tested positive or been in contact with someone with symptoms/positive test in the past 5-10 days? No

## 2021-09-02 ENCOUNTER — Encounter: Payer: Self-pay | Admitting: Hematology

## 2021-09-22 ENCOUNTER — Emergency Department
Admission: EM | Admit: 2021-09-22 | Discharge: 2021-09-22 | Disposition: A | Discharge status: Home / Self Care | Payer: Medicaid Other | Attending: Emergency Medicine | Admitting: Emergency Medicine

## 2021-09-22 ENCOUNTER — Telehealth: Payer: Self-pay

## 2021-09-22 ENCOUNTER — Emergency Department: Payer: Medicaid Other

## 2021-09-22 ENCOUNTER — Other Ambulatory Visit: Payer: Self-pay

## 2021-09-22 ENCOUNTER — Encounter: Payer: Self-pay | Admitting: Emergency Medicine

## 2021-09-22 DIAGNOSIS — K047 Periapical abscess without sinus: Secondary | ICD-10-CM

## 2021-09-22 DIAGNOSIS — R220 Localized swelling, mass and lump, head: Secondary | ICD-10-CM

## 2021-09-22 LAB — CBC WITH DIFFERENTIAL/PLATELET
Abs Immature Granulocytes: 0.02 10*3/uL (ref 0.00–0.07)
Basophils Absolute: 0 10*3/uL (ref 0.0–0.1)
Basophils Relative: 1 %
Eosinophils Absolute: 0.2 10*3/uL (ref 0.0–0.5)
Eosinophils Relative: 3 %
HCT: 48.7 % (ref 39.0–52.0)
Hemoglobin: 16.2 g/dL (ref 13.0–17.0)
Immature Granulocytes: 0 %
Lymphocytes Relative: 13 %
Lymphs Abs: 1 10*3/uL (ref 0.7–4.0)
MCH: 32.3 pg (ref 26.0–34.0)
MCHC: 33.3 g/dL (ref 30.0–36.0)
MCV: 97.2 fL (ref 80.0–100.0)
Monocytes Absolute: 0.8 10*3/uL (ref 0.1–1.0)
Monocytes Relative: 10 %
Neutro Abs: 5.8 10*3/uL (ref 1.7–7.7)
Neutrophils Relative %: 73 %
Platelets: 247 10*3/uL (ref 150–400)
RBC: 5.01 MIL/uL (ref 4.22–5.81)
RDW: 13.2 % (ref 11.5–15.5)
WBC: 7.9 10*3/uL (ref 4.0–10.5)
nRBC: 0 % (ref 0.0–0.2)

## 2021-09-22 LAB — COMPREHENSIVE METABOLIC PANEL
ALT: 23 U/L (ref 0–44)
AST: 27 U/L (ref 15–41)
Albumin: 3.5 g/dL (ref 3.5–5.0)
Alkaline Phosphatase: 80 U/L (ref 38–126)
Anion gap: 10 (ref 5–15)
BUN: 12 mg/dL (ref 8–23)
CO2: 27 mmol/L (ref 22–32)
Calcium: 9.3 mg/dL (ref 8.9–10.3)
Chloride: 102 mmol/L (ref 98–111)
Creatinine, Ser: 0.76 mg/dL (ref 0.61–1.24)
GFR, Estimated: >60 mL/min (ref >60)
Glucose, Bld: 99 mg/dL (ref 70–99)
Potassium: 3.8 mmol/L (ref 3.5–5.1)
Sodium: 139 mmol/L (ref 135–145)
Total Bilirubin: 0.8 mg/dL (ref 0.3–1.2)
Total Protein: 7.4 g/dL (ref 6.5–8.1)

## 2021-09-22 LAB — PROCALCITONIN: Procalcitonin: <0.1 ng/mL

## 2021-09-22 LAB — LACTIC ACID, PLASMA: Lactic Acid, Venous: 1.7 mmol/L (ref 0.5–1.9)

## 2021-09-22 MED ORDER — AMOXICILLIN-POT CLAVULANATE 875-125 MG PO TABS: 1.0000 | ORAL_TABLET | Freq: Two times a day (BID) | ORAL | 0 refills | Status: AC

## 2021-09-22 MED ORDER — GLYCOPYRROLATE 2 MG PO TABS: 2.0000 mg | ORAL_TABLET | Freq: Three times a day (TID) | ORAL | 0 refills | Status: DC | PRN

## 2021-09-22 MED ORDER — IOHEXOL 300 MG/ML  SOLN
75.0000 mL | Freq: Once | INTRAMUSCULAR | Status: AC | PRN
  Administered 2021-09-22: 75 mL via INTRAVENOUS

## 2021-09-22 MED ORDER — GLYCOPYRROLATE 1 MG PO TABS
2.0000 mg | ORAL_TABLET | Freq: Once | ORAL | Status: AC
  Administered 2021-09-22: 2 mg via ORAL
  Filled 2021-09-22: qty 2

## 2021-09-22 MED ORDER — PIPERACILLIN-TAZOBACTAM 3.375 G IVPB 30 MIN
3.3750 g | Freq: Once | INTRAVENOUS | Status: AC
  Administered 2021-09-22: 3.375 g via INTRAVENOUS
  Filled 2021-09-22: qty 50

## 2021-09-22 MED ORDER — ONDANSETRON 4 MG PO TBDP: 4.0000 mg | ORAL_TABLET | Freq: Three times a day (TID) | ORAL | 0 refills | Status: DC | PRN

## 2021-09-22 NOTE — ED Provider Triage Note (Signed)
Emergency Medicine Provider Triage Evaluation Note ? ?[name] [name] , a 61 y.o. male  was evaluated in triage.  Pt complains of right-sided facial swelling.  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  Patient states that swelling started approximately 1 week ago.  He has had chills but no fever.  No nausea, vomiting or abdominal pain. ? ?Review of Systems  ?Positive: Patient has facial swelling. ?Negative: No chest pain or abdominal pain. ? ?Physical Exam  ?There were no vitals taken for this visit. ?Gen:   Awake, no distress   ?Resp:  Normal effort  ?MSK:   Moves extremities without difficulty  ?Other:   ? ?Medical Decision Making  ?Medically screening exam initiated at 5:29 PM.  Appropriate orders placed.  [name] [name] was informed that the remainder of the evaluation will be completed by another provider, this initial triage assessment does not replace that evaluation, and the importance of remaining in the ED until their evaluation is complete. ? ? ?  ?Vallarie Mare [place], PA-C ?09/22/21 1730 ? ?

## 2021-09-22 NOTE — ED Triage Notes (Signed)
Pt to ED via POV with c/o facial swelling. He has a hoarse voice, he has history of tonsillar cancer. Right side of his face is swollen.  ?

## 2021-09-22 NOTE — Telephone Encounter (Signed)
Pt walked in with rt side of face swollen, red and painful for 2 days. Pt has not contacted PCP, palliative care or oncology. Pt not sure if running a fever, pt is not sure if has tooth infection; when asked pt if possible tooth issues pt said "who knows". Pt said he is not really swelling in his throat but he has problems with his throat;  pts chart review tab pt has pt with chronice osteo;yelitis of mandible, primary squamous cell CA of linguinal tonsil and recurrent DVT.  Pt advised to go to ED; pt said he will go to [facility] ED now; pt is driving and I asked pt if he felt like driving and he said yes and declined calling someone else or 911. Sending note to Dr Darrick Meigs as PCP and Leafy Ro RN and practice admin at [facility]. ?

## 2021-09-22 NOTE — ED Provider Notes (Signed)
? ?[facility] ?Provider Note ? ? ? Event Date/Time  ? First MD Initiated Contact with Patient 09/22/21 2140   ?  (approximate) ? ? ?History  ? ?Facial Swelling ? ? ?HPI ? ?[name] [name] is a 61 y.o. male  with complex PMHx including L tonsillar CA s/p radiation, DVT, here with R facial pain and swelling. Pt reports that over the past week, he has noticed increasing pain and swelling of his R jaw and lower face. He has chronic swelling on the left, with osteoradionecrosis and chronic difficulty with opening his mouth 2/2 his prior cancer and radiation. He states the R facial pain is new, however. Denies any fevers or chills. He has not been on any ABX recently. Denies any new difficulty speaking or swallowing beyond his chronic issues related to his prior treatments and cancer. ? ?  ? ? ?Physical Exam  ? ?Triage Vital Signs: ?ED Triage Vitals [09/22/21 1729]  ?Enc Vitals Group  ?   BP 107/86  ?   Pulse Rate 96  ?   Resp 18  ?   Temp 98.8 ?F (37.1 ?C)  ?   Temp Source Oral  ?   SpO2 97 %  ?   Weight 240 lb (108.9 kg)  ?   Height '6\' 3"'$  (1.905 m)  ?   Head Circumference   ?   Peak Flow   ?   Pain Score 4  ?   Pain Loc   ?   Pain Edu?   ?   Excl. in [place]?   ? ? ?Most recent vital signs: ?Vitals:  ? 09/22/21 2128 09/22/21 2225  ?BP: (!) 137/94 124/80  ?Pulse: 90 88  ?Resp: 17 17  ?Temp:    ?SpO2: 98% 100%  ? ? ? ?General: Awake, no distress.  ?CV:  Good peripheral perfusion. No murmurs, rubs, gallops.  ?Resp:  Normal effort. Clear breath sounds. No stridor. ?Abd:  No distention. No tenderness. ?Other:  Markedly poor dentition, with diffuse gingival edema and scarring of the left mandible/posterior pharynx. The area of pain corresponds to several necrotic teeth in the right lower jaw, with some mild facial swelling. No sublingual swelling or fullness. No neck swelling or fullness.  ? ? ?ED Results / Procedures / Treatments  ? ?Labs ?(all labs ordered are listed, but only abnormal results are  displayed) ?Labs Reviewed  ?CBC WITH DIFFERENTIAL/PLATELET  ?COMPREHENSIVE METABOLIC PANEL  ?LACTIC ACID, PLASMA  ?PROCALCITONIN  ? ? ? ?EKG ? ? ? ?RADIOLOGY ?CT Face: Likely acute infection of R second maxillary premolar, small odontogenic abscess ? ?I also independently reviewed and agree wit radiologist interpretations. ? ? ?PROCEDURES: ? ?Critical Care performed: No ? ? ?MEDICATIONS ORDERED IN ED: ?Medications  ?iohexol (OMNIPAQUE) 300 MG/ML solution 75 mL (75 mLs Intravenous Contrast Given 09/22/21 2028)  ?piperacillin-tazobactam (ZOSYN) IVPB 3.375 g (0 g Intravenous Stopped 09/22/21 2241)  ?glycopyrrolate (ROBINUL) tablet 2 mg (2 mg Oral Given 09/22/21 2232)  ? ? ? ?IMPRESSION / MDM / ASSESSMENT AND PLAN / ED COURSE  ?I reviewed the triage vital signs and the nursing notes. ?             ?               ? ?MDM:  ?61 yo M with PMHx tonsillar CA, DVT here with R facial pain and swelling. CT scan reviewed and shows likely acute odontogenic infection of the right mandible. No signs of osteo on  the right. No abscess amenable to drainage on exam. Pt has no sublingual swelling, edema, or signs of Ludwig's. CBC without leukocytosis. CMP normal. Pt is not currently on chemo or immunosuppressed.  ? ?Suspect acute odontogenic infection, in setting of markedly poor dentition and chronic osteoradionecrosis of L mandible. Pt states that he has had significant increase in secretions from his L mandible, which happens when he has an infection. He is, however, tolerating PO, with intact airway and no signs of airway compromise at this time. ? ?Discussed that given his complex history, would consider admission with IV ABX, but pt would like to attempt outpt management. Pt was given a dose of IV Zosyn and will d/c on augmentin. He will need to f/u with dentistry and ENT this week. Otherwise, good return precautions given. Will trial glycopyrrolate to help with his secretions, but I notified him that he must be sure to regularly rinse  his mouth, ideally with chlorhexidine wash, to prevent worsening of his chronic infection from dryness. ? ? ?MEDICATIONS GIVEN IN ED: ?Medications  ?iohexol (OMNIPAQUE) 300 MG/ML solution 75 mL (75 mLs Intravenous Contrast Given 09/22/21 2028)  ?piperacillin-tazobactam (ZOSYN) IVPB 3.375 g (0 g Intravenous Stopped 09/22/21 2241)  ?glycopyrrolate (ROBINUL) tablet 2 mg (2 mg Oral Given 09/22/21 2232)  ? ? ? ?Consults:  ?None ? ? ?EMR reviewed  ?ENT and Oncology clinic notes ? ? ? ? ?FINAL CLINICAL IMPRESSION(S) / ED DIAGNOSES  ? ?Final diagnoses:  ?Facial swelling  ?Dental infection  ? ? ? ?Rx / DC Orders  ? ?ED Discharge Orders   ? ?      Ordered  ?  amoxicillin-clavulanate (AUGMENTIN) 875-125 MG tablet  2 times daily       ? 09/22/21 2243  ?  ondansetron (ZOFRAN-ODT) 4 MG disintegrating tablet  Every 8 hours PRN       ? 09/22/21 2243  ?  glycopyrrolate (ROBINUL) 2 MG tablet  3 times daily PRN       ? 09/22/21 2243  ? ?  ?  ? ?  ? ? ? ?Note:  This document was prepared using Dragon voice recognition software and may include unintentional dictation errors. ?  ?Duffy Bruce, MD ?09/23/21 0147 ? ?

## 2021-09-22 NOTE — Discharge Instructions (Signed)
It is very important that you take the full course of antibiotics as we discussed. ? ?Follow-up with your dentist within the next several days.  You may need a tooth pulled. ?

## 2021-09-23 ENCOUNTER — Telehealth: Payer: Self-pay

## 2021-09-23 NOTE — Telephone Encounter (Signed)
Transition Care Management Unsuccessful Follow-up Telephone Call ? ?Date of discharge and from where:  09/22/2021-[facility] ? ?Attempts:  1st Attempt ? ?Reason for unsuccessful TCM follow-up call:  Left voice message ? ?  ?

## 2021-09-24 NOTE — Telephone Encounter (Signed)
Noted.  Discussed this with Rena Triage on date of encounter.  ? ? ?

## 2021-09-24 NOTE — Telephone Encounter (Signed)
Transition Care Management Unsuccessful Follow-up Telephone Call ? ?Date of discharge and from where:  09/22/2021 from [facility] ? ?Attempts:  2nd Attempt ? ?Reason for unsuccessful TCM follow-up call:  Left voice message ? ? ? ?

## 2021-09-25 ENCOUNTER — Telehealth: Payer: Self-pay | Admitting: Internal Medicine

## 2021-09-25 NOTE — Telephone Encounter (Signed)
Transition Care Management Unsuccessful Follow-up Telephone Call ? ?Date of discharge and from where:  09/22/2021-[facility] ? ?Attempts:  3rd Attempt ? ?Reason for unsuccessful TCM follow-up call:  Left voice message ? ?  ?

## 2021-09-25 NOTE — Telephone Encounter (Signed)
Attempted to contact him to ensure he has follow up with dentistry and ENT however was unable to reach him. Will ask our clinic staff to continue to attempt to reach him. ? ?[name], MD ?

## 2021-09-26 ENCOUNTER — Telehealth: Payer: Self-pay | Admitting: Hematology and Oncology

## 2021-09-26 NOTE — Telephone Encounter (Signed)
.  Called pt per 2/14 inbasket , Patient was unavailable, a message with appt time and date was left with number on file.   ?

## 2021-09-29 ENCOUNTER — Other Ambulatory Visit: Payer: Medicaid Other | Admitting: Student

## 2021-09-29 ENCOUNTER — Telehealth: Payer: Self-pay | Admitting: Student

## 2021-09-29 ENCOUNTER — Other Ambulatory Visit: Payer: Self-pay

## 2021-09-29 NOTE — Telephone Encounter (Signed)
Patient's significant other notified NP that patient will need to reschedule palliative appointment this afternoon. He will call back to reschedule.  ?

## 2021-10-06 ENCOUNTER — Other Ambulatory Visit: Payer: Medicaid Other | Admitting: Student

## 2021-10-06 ENCOUNTER — Other Ambulatory Visit: Payer: Self-pay

## 2021-10-06 DIAGNOSIS — C024 Malignant neoplasm of lingual tonsil: Secondary | ICD-10-CM

## 2021-10-06 DIAGNOSIS — Z515 Encounter for palliative care: Secondary | ICD-10-CM

## 2021-10-06 DIAGNOSIS — G8929 Other chronic pain: Secondary | ICD-10-CM

## 2021-10-06 DIAGNOSIS — M272 Inflammatory conditions of jaws: Secondary | ICD-10-CM

## 2021-10-06 NOTE — Progress Notes (Signed)
? ? ?[facility] Consult Note ?Telephone: [phone]  ?Fax: [phone]  ? ? ?Date of encounter: 10/06/21 ?4:10 PM ?PATIENT NAME: [name] [name] ?[address] ?[place] [address]   ?[phone] (home)  ?DOB: 09/10/1960 ?MRN: [id number] ?PRIMARY CARE PROVIDER:    ?[name], MD,  ?[address] ?[place] [address] ?[phone] ? ?REFERRING PROVIDER:   ?[name], MD ?[address] ?[phone] ?[place],  [place] [address] ?[phone] ? ?RESPONSIBLE PARTY:    ?Contact Information   ? ? Name Relation Home Work Mobile  ? Poe,Lisa Significant other [phone]    ? Yuriel, [place] Daughter   [phone]  ? ?  ? ? ? ?Due to the COVID-19 crisis, this visit was done via telemedicine from my office and it was initiated and consent by this patient and or family. ? ?I connected with  [name] [name] OR PROXY on 10/06/21 by a video enabled telemedicine application and verified that I am speaking with the correct person using two identifiers. ?  ?I discussed the limitations of evaluation and management by telemedicine. The patient expressed understanding and agreed to proceed.  ? ?                                 ASSESSMENT AND PLAN / RECOMMENDATIONS:  ? ?Advance Care Planning/Goals of Care: Goals include to maximize quality of life and symptom management. Patient/health care surrogate gave his/her permission to discuss. ?Our advance care planning conversation included a discussion about:    ?The value and importance of advance care planning  ?Experiences with loved ones who have been seriously ill or have died  ?Exploration of personal, cultural or spiritual beliefs that might influence medical decisions  ?Exploration of goals of care in the event of a sudden injury or illness  ?CODE STATUS: Full Code ? ?Symptom Management/Plan: ? 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 Follow up with oncology as  scheduled. Osteomyelitis-patient expresses hesitancy with proceeding with surgery for mandible debridement with possible trach and pectoralis flap. Patient would like to consult with ENT again.  ? ?Chronic pain-related to osteomyelitis. Continue oxycodone 5 mg every 8 hours PRN; gabapentin PRN pain, flexeril PRN.  ? ?Odontogenic infection-seen in ED on 09/22/21. Patient completed antibiotics yesterday. Right facial swelling has improved some. He is highly encouraged to follow up with ENT or dentist as he has been treated for infection several times now in the past 6 months.  ? ?Follow up Palliative Care Visit: Palliative care will continue to follow for complex medical decision making, advance care planning, and clarification of goals. Return in 4 weeks or prn. ? ? ?This visit was coded based on medical decision making (MDM). ? ?PPS: 50% ? ?HOSPICE ELIGIBILITY/DIAGNOSIS: TBD ? ?Chief Complaint: Palliative Medicine follow up visit.  ? ?HISTORY OF PRESENT ILLNESS:  [name] [name] is a 61 y.o. year old male  with  osteomyelitis of left jaw, squamous cell carcinoma of left tonsil, chronic pain, hypertension, hyperlipidemia, hx of DVT and PE.  ? ?Patient seen in ED on 09/22/21 due to right facial swelling; he completed antibiotics yesterday. He states swelling has improved some; still with some right jaw pain and ongoing pain to left jaw. He has been taking oxycodone and ibuprofen PRN with relief. He has not followed up with dentist or ENT yet. Has a follow up with Dr. Isidore Moos, radiation  oncology next week. He is eating mostly soft foods, liquids. Denies weight loss. He continues on Xarelto for recurrent DVT's. A 10-point ROS is negative, except for the pertinent positives and negatives detailed per the HPI. ? ?History obtained from review of EMR, discussion with primary team, and interview with family, facility staff/caregiver and/or [name] [name].  ?I reviewed available labs, medications, imaging, studies and related documents  from the EMR.  Records reviewed and summarized above.  ? ? ?Physical Exam: ?Constitutional: NAD ?General: frail appearing ?EYES: anicteric sclera, lids intact, no discharge  ?ENMT: intact hearing, oral mucous membranes moist, ill dentition ?Pulmonary: no increased work of breathing, no cough, room air ?GU: deferred ?MSK: no sarcopenia, moves all extremities, ambulatory ?Skin: no rashes or wounds on visible skin ?Neuro:  no generalized weakness,  no cognitive impairment ?Psych: non-anxious affect, A and O x 3 ?Hem/lymph/immuno: no widespread bruising ? ? ?Thank you for the opportunity to participate in the care of [name] [name].  The palliative care team will continue to follow. Please call our office at [phone] if we can be of additional assistance.  ? ?Ezekiel Slocumb, NP  ? ?COVID-19 PATIENT SCREENING TOOL ?Asked and negative response unless otherwise noted:  ? ?Have you had symptoms of covid, tested positive or been in contact with someone with symptoms/positive test in the past 5-10 days? No ? ?

## 2021-10-14 ENCOUNTER — Telehealth: Payer: Self-pay | Admitting: *Deleted

## 2021-10-14 ENCOUNTER — Ambulatory Visit: Payer: Medicaid Other | Admitting: Radiation Oncology

## 2021-10-14 NOTE — Telephone Encounter (Signed)
CALLED PATIENT TO ASK ABOUT RESCHEULING TODAY'S FU DUE TO BEING SICK, PATIENT'S SGO LISA POE STATED THAT HE IS RESTING AND WILL CALL ME LATER AND RESCHEDULE THIS APPT. ?

## 2021-10-23 NOTE — Telephone Encounter (Signed)
Appointment scheduled for 11/18/2021 at 3:15 pm and mailed to patient. ?

## 2021-11-06 ENCOUNTER — Telehealth: Payer: Self-pay | Admitting: *Deleted

## 2021-11-06 ENCOUNTER — Other Ambulatory Visit: Payer: Medicaid Other | Admitting: Student

## 2021-11-06 DIAGNOSIS — Z515 Encounter for palliative care: Secondary | ICD-10-CM

## 2021-11-06 DIAGNOSIS — G8929 Other chronic pain: Secondary | ICD-10-CM

## 2021-11-06 DIAGNOSIS — M272 Inflammatory conditions of jaws: Secondary | ICD-10-CM

## 2021-11-06 DIAGNOSIS — L089 Local infection of the skin and subcutaneous tissue, unspecified: Secondary | ICD-10-CM

## 2021-11-06 DIAGNOSIS — R0989 Other specified symptoms and signs involving the circulatory and respiratory systems: Secondary | ICD-10-CM

## 2021-11-06 NOTE — Progress Notes (Signed)
? ? ?Manufacturing engineer ?Community Palliative Care Consult Note ?Telephone: [phone]  ?Fax: [phone]  ? ? ?Date of encounter: 11/06/21 ?3:00 PM ?PATIENT NAME: [name] [name] ?[address] ?[place] [address]   ?[phone] (home)  ?DOB: 1960-08-24 ?MRN: [id number] ?PRIMARY CARE PROVIDER:    ?[name], MD,  ?[address] ?[place] [address] ?[phone] ? ?REFERRING PROVIDER:   ?[name], MD ?[address] ?[id number] ?[place],  [place] [address] ?[phone] ? ?RESPONSIBLE PARTY:    ?Contact Information   ? ? Name Relation Home Work Mobile  ? Poe,Lisa Significant other [phone]    ? [place], Delauder Daughter   [phone]  ? ?  ? ? ? ?I met face to face with patient and family in the home. Palliative Care was asked to follow this patient by consultation request of  [name], MD to address advance care planning and complex medical decision making. This is a follow up visit. ? ?                                 ASSESSMENT AND PLAN / RECOMMENDATIONS:  ? ?Advance Care Planning/Goals of Care: Goals include to maximize quality of life and symptom management. Patient/health care surrogate gave his/her permission to discuss. ?Our advance care planning conversation included a discussion about:    ?The value and importance of advance care planning  ?Experiences with loved ones who have been seriously ill or have died  ?Exploration of personal, cultural or spiritual beliefs that might influence medical decisions  ?Exploration of goals of care in the event of a sudden injury or illness  ?CODE STATUS: Full Code ? ?Education provided on palliative medicine. We discussed repeat oral infections, patient is highly encouraged to follow up with dentist or ENT. Patient encouraged to follow up with oncology as scheduled.  ? ?Symptom Management/Plan: ? ?Chronic pain-related to osteomyelitis; right lower extremity pain. Continue oxycodone 5 mg every 8 hours PRN;  flexeril PRN. Patient is encouraged to take his gabapentin routinely instead of PRN to see if this helps with pain. ? 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 Follow up with oncology as scheduled. Osteomyelitis-patient expresses hesitancy with proceeding with surgery for mandible debridement with possible trach and pectoralis flap.  ? ?Right lower extremity, suspected DVT-patient with increased swelling, pain, discoloration. Suspect DVT given his history and presentation. Patient adamantly refuses to go to ED. His significant other is on phone during visit to be seen with his PCP as soon as possible. He states he is taking his Xarelto. Addendum; patient to follow up with PCP tomorrow. ? ?Skin infection-patient with skin infection to right great toe. Area is with swelling, mild erythema, pus type drainage, tender to touch. Will start keflex 566m BID x 5 days, betadine foot soak BID x 5 days.  ? ?Follow up Palliative Care Visit: Palliative care will continue to follow for complex medical decision making, advance care planning, and clarification of goals. Return in 4 weeks or prn. ? ? ?This visit was coded based on medical decision making (MDM). ? ?PPS: 50% ? ?HOSPICE ELIGIBILITY/DIAGNOSIS: TBD ? ?Chief Complaint: Palliative Medicine follow up visit.  ? ?HISTORY OF PRESENT ILLNESS:  HTHERIN VETSCHis a 61y.o. year old male  with osteomyelitis of left jaw, squamous cell carcinoma of left tonsil, chronic pain, hypertension, hyperlipidemia, hx of DVT and PE.  ? ?Patient reports  having increased pain, swelling, redness to right leg in past 2-3 weeks. Continues to have chronic pain related to hx of osteomyelitis of jaw. Endorses numbness, electrical, jolting pain to right lower extremity. Color changes to RLE when he stands; having increased difficulty walking. Patient states he is to follow up with Vascular on 11/27/21.  He also reports pain, tenderness and drainage to right medial  great toe. No fever or chills. A 10-point ROS is negative, except for the pertinent positives and negatives detailed per the HPI. ? ?History obtained from review of EMR, discussion with primary team, and interview with family, facility staff/caregiver and/or [name].  ?I reviewed available labs, medications, imaging, studies and related documents from the EMR.  Records reviewed and summarized above.  ? ? ?Physical Exam: ?Pulse 84, resp 20, b/p 110/72, sats 94% on room air ?Constitutional: NAD ?General: frail appearing ?EYES: anicteric sclera, lids intact, no discharge  ?ENMT: intact hearing, oral mucous membranes moist, ill dentition ?CV: S1S2, RRR, 1+LE edema ?Pulmonary: LCTA, no increased work of breathing, no cough, room air ?Abdomen: normo-active BS + 4 quadrants, soft and non tender, no ascites ?GU: deferred ?MSK:  moves all extremities, ambulatory ?Skin: warm and dry, right great toe tender to touch, mild erythema, pus drainage present. Color is pale to RLE when elevated, RLE turns dark red when standing/walking ?Neuro:  no generalized weakness,  no cognitive impairment ?Psych: non-anxious affect, A and O x 3 ?Hem/lymph/immuno: no widespread bruising ? ? ?Thank you for the opportunity to participate in the care of [name].  The palliative care team will continue to follow. Please call our office at [phone] if we can be of additional assistance.  ? ?Ezekiel Slocumb, NP  ? ?COVID-19 PATIENT SCREENING TOOL ?Asked and negative response unless otherwise noted:  ? ?Have you had symptoms of covid, tested positive or been in contact with someone with symptoms/positive test in the past 5-10 days? No ? ?

## 2021-11-06 NOTE — Telephone Encounter (Signed)
Call from patient's caregiver states patient is having pain in his legs.  Patient when standing legs turn purple.  Has to keep legs up for comfort.  [facility].     Caregiver would like for patient to have a Doppler done to see if he has a clot or something.  Refuses to go to ER for assessment of symptoms and evaluation .  Spoke with Dr. Philipp Ovens patient was given an appointment with Dr. Raymondo Band in the morning at 10:45 AM. Appointment given to caregiver and expressed importance of coming in for appointment before Dopplers can be ordered. ?

## 2021-11-07 ENCOUNTER — Other Ambulatory Visit (HOSPITAL_COMMUNITY): Payer: Self-pay | Admitting: Internal Medicine

## 2021-11-07 ENCOUNTER — Ambulatory Visit (HOSPITAL_BASED_OUTPATIENT_CLINIC_OR_DEPARTMENT_OTHER)
Admission: RE | Admit: 2021-11-07 | Discharge: 2021-11-07 | Disposition: A | Discharge status: Home / Self Care | Payer: Medicaid Other | Source: Ambulatory Visit | Attending: Vascular Surgery | Admitting: Vascular Surgery

## 2021-11-07 ENCOUNTER — Inpatient Hospital Stay (HOSPITAL_COMMUNITY)
Admission: EM | Admit: 2021-11-07 | Discharge: 2021-11-13 | DRG: 300 | Disposition: A | Discharge status: Home / Self Care | Payer: Medicaid Other | Attending: Vascular Surgery | Admitting: Vascular Surgery

## 2021-11-07 ENCOUNTER — Emergency Department (HOSPITAL_COMMUNITY): Payer: Medicaid Other

## 2021-11-07 ENCOUNTER — Other Ambulatory Visit: Payer: Self-pay

## 2021-11-07 ENCOUNTER — Ambulatory Visit: Payer: Medicaid Other | Admitting: Internal Medicine

## 2021-11-07 ENCOUNTER — Encounter: Payer: Self-pay | Admitting: Internal Medicine

## 2021-11-07 ENCOUNTER — Ambulatory Visit (HOSPITAL_BASED_OUTPATIENT_CLINIC_OR_DEPARTMENT_OTHER)
Admission: RE | Admit: 2021-11-07 | Discharge: 2021-11-07 | Disposition: A | Discharge status: Home / Self Care | Payer: Medicaid Other | Source: Ambulatory Visit | Attending: Internal Medicine | Admitting: Internal Medicine

## 2021-11-07 ENCOUNTER — Encounter (HOSPITAL_COMMUNITY): Payer: Self-pay

## 2021-11-07 VITALS — BP 123/85 | HR 86 | Temp 98.1°F | Wt 238.4 lb

## 2021-11-07 DIAGNOSIS — I517 Cardiomegaly: Secondary | ICD-10-CM | POA: Diagnosis not present

## 2021-11-07 DIAGNOSIS — K5730 Diverticulosis of large intestine without perforation or abscess without bleeding: Secondary | ICD-10-CM | POA: Diagnosis present

## 2021-11-07 DIAGNOSIS — Z85818 Personal history of malignant neoplasm of other sites of lip, oral cavity, and pharynx: Secondary | ICD-10-CM

## 2021-11-07 DIAGNOSIS — R1032 Left lower quadrant pain: Secondary | ICD-10-CM | POA: Diagnosis not present

## 2021-11-07 DIAGNOSIS — I70221 Atherosclerosis of native arteries of extremities with rest pain, right leg: Secondary | ICD-10-CM | POA: Diagnosis not present

## 2021-11-07 DIAGNOSIS — I739 Peripheral vascular disease, unspecified: Secondary | ICD-10-CM

## 2021-11-07 DIAGNOSIS — R0789 Other chest pain: Secondary | ICD-10-CM | POA: Diagnosis not present

## 2021-11-07 DIAGNOSIS — I2510 Atherosclerotic heart disease of native coronary artery without angina pectoris: Secondary | ICD-10-CM | POA: Diagnosis present

## 2021-11-07 DIAGNOSIS — R0902 Hypoxemia: Secondary | ICD-10-CM | POA: Diagnosis not present

## 2021-11-07 DIAGNOSIS — Z79890 Hormone replacement therapy (postmenopausal): Secondary | ICD-10-CM

## 2021-11-07 DIAGNOSIS — I82409 Acute embolism and thrombosis of unspecified deep veins of unspecified lower extremity: Secondary | ICD-10-CM

## 2021-11-07 DIAGNOSIS — M79671 Pain in right foot: Secondary | ICD-10-CM | POA: Diagnosis not present

## 2021-11-07 DIAGNOSIS — Z823 Family history of stroke: Secondary | ICD-10-CM | POA: Diagnosis not present

## 2021-11-07 DIAGNOSIS — F17210 Nicotine dependence, cigarettes, uncomplicated: Secondary | ICD-10-CM | POA: Diagnosis present

## 2021-11-07 DIAGNOSIS — K5900 Constipation, unspecified: Secondary | ICD-10-CM | POA: Diagnosis present

## 2021-11-07 DIAGNOSIS — R140 Abdominal distension (gaseous): Secondary | ICD-10-CM | POA: Diagnosis not present

## 2021-11-07 DIAGNOSIS — Z9221 Personal history of antineoplastic chemotherapy: Secondary | ICD-10-CM

## 2021-11-07 DIAGNOSIS — J90 Pleural effusion, not elsewhere classified: Secondary | ICD-10-CM | POA: Diagnosis not present

## 2021-11-07 DIAGNOSIS — Z79899 Other long term (current) drug therapy: Secondary | ICD-10-CM | POA: Diagnosis not present

## 2021-11-07 DIAGNOSIS — I998 Other disorder of circulatory system: Secondary | ICD-10-CM | POA: Diagnosis not present

## 2021-11-07 DIAGNOSIS — I723 Aneurysm of iliac artery: Secondary | ICD-10-CM | POA: Diagnosis present

## 2021-11-07 DIAGNOSIS — I4821 Permanent atrial fibrillation: Secondary | ICD-10-CM | POA: Diagnosis not present

## 2021-11-07 DIAGNOSIS — E782 Mixed hyperlipidemia: Secondary | ICD-10-CM | POA: Diagnosis present

## 2021-11-07 DIAGNOSIS — I712 Thoracic aortic aneurysm, without rupture, unspecified: Secondary | ICD-10-CM | POA: Diagnosis not present

## 2021-11-07 DIAGNOSIS — R0602 Shortness of breath: Secondary | ICD-10-CM | POA: Diagnosis not present

## 2021-11-07 DIAGNOSIS — I70235 Atherosclerosis of native arteries of right leg with ulceration of other part of foot: Secondary | ICD-10-CM | POA: Diagnosis not present

## 2021-11-07 DIAGNOSIS — Z923 Personal history of irradiation: Secondary | ICD-10-CM | POA: Diagnosis not present

## 2021-11-07 DIAGNOSIS — N280 Ischemia and infarction of kidney: Secondary | ICD-10-CM | POA: Diagnosis present

## 2021-11-07 DIAGNOSIS — Z01810 Encounter for preprocedural cardiovascular examination: Secondary | ICD-10-CM | POA: Diagnosis not present

## 2021-11-07 DIAGNOSIS — I452 Bifascicular block: Secondary | ICD-10-CM | POA: Diagnosis not present

## 2021-11-07 DIAGNOSIS — I745 Embolism and thrombosis of iliac artery: Secondary | ICD-10-CM | POA: Diagnosis not present

## 2021-11-07 DIAGNOSIS — Z7901 Long term (current) use of anticoagulants: Secondary | ICD-10-CM | POA: Diagnosis not present

## 2021-11-07 DIAGNOSIS — K8020 Calculus of gallbladder without cholecystitis without obstruction: Secondary | ICD-10-CM | POA: Diagnosis not present

## 2021-11-07 DIAGNOSIS — J439 Emphysema, unspecified: Secondary | ICD-10-CM | POA: Diagnosis not present

## 2021-11-07 DIAGNOSIS — I724 Aneurysm of artery of lower extremity: Secondary | ICD-10-CM | POA: Diagnosis present

## 2021-11-07 DIAGNOSIS — I10 Essential (primary) hypertension: Secondary | ICD-10-CM | POA: Diagnosis present

## 2021-11-07 DIAGNOSIS — Z86718 Personal history of other venous thrombosis and embolism: Secondary | ICD-10-CM | POA: Diagnosis not present

## 2021-11-07 HISTORY — DX: Unspecified atrial fibrillation: I48.91

## 2021-11-07 HISTORY — DX: Malignant neoplasm of tonsil, unspecified: C09.9

## 2021-11-07 LAB — PROTIME-INR
INR: 1 (ref 0.8–1.2)
INR: 1.2 (ref 0.8–1.2)
Prothrombin Time: 13.5 seconds (ref 11.4–15.2)
Prothrombin Time: 15.4 seconds — ABNORMAL HIGH (ref 11.4–15.2)

## 2021-11-07 LAB — BASIC METABOLIC PANEL
Anion gap: 8 (ref 5–15)
BUN: 13 mg/dL (ref 8–23)
CO2: 27 mmol/L (ref 22–32)
Calcium: 9.3 mg/dL (ref 8.9–10.3)
Chloride: 106 mmol/L (ref 98–111)
Creatinine, Ser: 0.98 mg/dL (ref 0.61–1.24)
GFR, Estimated: >60 mL/min (ref >60)
Glucose, Bld: 91 mg/dL (ref 70–99)
Potassium: 4.5 mmol/L (ref 3.5–5.1)
Sodium: 141 mmol/L (ref 135–145)

## 2021-11-07 LAB — CBC
HCT: 43.3 % (ref 39.0–52.0)
Hemoglobin: 14.8 g/dL (ref 13.0–17.0)
MCH: 33 pg (ref 26.0–34.0)
MCHC: 34.2 g/dL (ref 30.0–36.0)
MCV: 96.4 fL (ref 80.0–100.0)
Platelets: 241 10*3/uL (ref 150–400)
RBC: 4.49 MIL/uL (ref 4.22–5.81)
RDW: 13.6 % (ref 11.5–15.5)
WBC: 6.1 10*3/uL (ref 4.0–10.5)
nRBC: 0 % (ref 0.0–0.2)

## 2021-11-07 LAB — HIV ANTIBODY (ROUTINE TESTING W REFLEX): HIV Screen 4th Generation wRfx: NONREACTIVE

## 2021-11-07 LAB — APTT
aPTT: 22 seconds — ABNORMAL LOW (ref 24–36)
aPTT: >200 seconds (ref 24–36)

## 2021-11-07 LAB — HEPARIN LEVEL (UNFRACTIONATED)
Heparin Unfractionated: <0.1 IU/mL — ABNORMAL LOW (ref 0.30–0.70)
Heparin Unfractionated: >1.1 IU/mL — ABNORMAL HIGH (ref 0.30–0.70)
Heparin Unfractionated: 0.61 IU/mL (ref 0.30–0.70)

## 2021-11-07 MED ORDER — BOOST PLUS PO LIQD
237.0000 mL | Freq: Once | ORAL | Status: AC
  Administered 2021-11-07: 237 mL via ORAL
  Filled 2021-11-07: qty 237

## 2021-11-07 MED ORDER — HYDROMORPHONE HCL 1 MG/ML IJ SOLN
0.5000 mg | INTRAMUSCULAR | Status: DC | PRN
  Administered 2021-11-08 – 2021-11-10 (×4): 1 mg via INTRAVENOUS
  Administered 2021-11-10: 0.5 mg via INTRAVENOUS
  Administered 2021-11-10 – 2021-11-11 (×5): 1 mg via INTRAVENOUS
  Administered 2021-11-11: 0.5 mg via INTRAVENOUS
  Administered 2021-11-11 – 2021-11-13 (×6): 1 mg via INTRAVENOUS
  Administered 2021-11-13: 0.5 mg via INTRAVENOUS
  Filled 2021-11-07 (×8): qty 1
  Filled 2021-11-07: qty 0.5
  Filled 2021-11-07 (×3): qty 1
  Filled 2021-11-07: qty 0.5
  Filled 2021-11-07 (×3): qty 1
  Filled 2021-11-07: qty 0.5
  Filled 2021-11-07: qty 1

## 2021-11-07 MED ORDER — IOHEXOL 350 MG/ML SOLN
100.0000 mL | Freq: Once | INTRAVENOUS | Status: AC | PRN
Start: 2021-11-07 — End: 2021-11-07
  Administered 2021-11-07: 100 mL via INTRAVENOUS

## 2021-11-07 MED ORDER — SENNOSIDES-DOCUSATE SODIUM 8.6-50 MG PO TABS: 1.0000 | ORAL_TABLET | Freq: Every evening | ORAL | Status: DC | PRN

## 2021-11-07 MED ORDER — HYDRALAZINE HCL 20 MG/ML IJ SOLN: 5.0000 mg | INTRAMUSCULAR | Status: DC | PRN

## 2021-11-07 MED ORDER — LABETALOL HCL 5 MG/ML IV SOLN: 10.0000 mg | INTRAVENOUS | Status: DC | PRN

## 2021-11-07 MED ORDER — GUAIFENESIN-DM 100-10 MG/5ML PO SYRP
15.0000 mL | ORAL_SOLUTION | ORAL | Status: DC | PRN
  Filled 2021-11-07: qty 15

## 2021-11-07 MED ORDER — OXYCODONE-ACETAMINOPHEN 5-325 MG PO TABS
1.0000 | ORAL_TABLET | ORAL | Status: DC | PRN
  Administered 2021-11-08: 2 via ORAL
  Filled 2021-11-07 (×2): qty 2

## 2021-11-07 MED ORDER — PANTOPRAZOLE SODIUM 40 MG PO TBEC
40.0000 mg | DELAYED_RELEASE_TABLET | Freq: Every day | ORAL | Status: DC
  Administered 2021-11-07 – 2021-11-12 (×6): 40 mg via ORAL
  Filled 2021-11-07 (×7): qty 1

## 2021-11-07 MED ORDER — POTASSIUM CHLORIDE CRYS ER 20 MEQ PO TBCR: 20.0000 meq | EXTENDED_RELEASE_TABLET | Freq: Once | ORAL | Status: DC

## 2021-11-07 MED ORDER — ENOXAPARIN SODIUM 120 MG/0.8ML IJ SOSY: 1.0000 mg/kg | PREFILLED_SYRINGE | INTRAMUSCULAR | 0 refills | Status: DC

## 2021-11-07 MED ORDER — HYDROMORPHONE HCL 1 MG/ML IJ SOLN
1.0000 mg | Freq: Once | INTRAMUSCULAR | Status: AC
  Administered 2021-11-07: 1 mg via INTRAVENOUS
  Filled 2021-11-07: qty 1

## 2021-11-07 MED ORDER — NICOTINE 14 MG/24HR TD PT24
14.0000 mg | MEDICATED_PATCH | Freq: Once | TRANSDERMAL | Status: AC
  Administered 2021-11-07: 14 mg via TRANSDERMAL
  Filled 2021-11-07: qty 1

## 2021-11-07 MED ORDER — LACTATED RINGERS IV SOLN: INTRAVENOUS | Status: DC

## 2021-11-07 MED ORDER — HEPARIN BOLUS VIA INFUSION
6000.0000 [IU] | Freq: Once | INTRAVENOUS | Status: AC
  Administered 2021-11-07: 6000 [IU] via INTRAVENOUS
  Filled 2021-11-07: qty 6000

## 2021-11-07 MED ORDER — SODIUM CHLORIDE 0.9 % IV SOLN: INTRAVENOUS | Status: DC

## 2021-11-07 MED ORDER — ONDANSETRON HCL 4 MG/2ML IJ SOLN: 4.0000 mg | Freq: Four times a day (QID) | INTRAMUSCULAR | Status: DC | PRN

## 2021-11-07 MED ORDER — BISACODYL 5 MG PO TBEC: 5.0000 mg | DELAYED_RELEASE_TABLET | Freq: Every day | ORAL | Status: DC | PRN

## 2021-11-07 MED ORDER — BOOST / RESOURCE BREEZE PO LIQD CUSTOM
2.0000 | Freq: Once | ORAL | Status: DC
Start: 2021-11-07 — End: 2021-11-07
  Filled 2021-11-07: qty 2

## 2021-11-07 MED ORDER — METOPROLOL TARTRATE 5 MG/5ML IV SOLN: 2.0000 mg | INTRAVENOUS | Status: DC | PRN

## 2021-11-07 MED ORDER — DOCUSATE SODIUM 100 MG PO CAPS
100.0000 mg | ORAL_CAPSULE | Freq: Two times a day (BID) | ORAL | Status: DC
  Administered 2021-11-07 – 2021-11-09 (×3): 100 mg via ORAL
  Filled 2021-11-07 (×3): qty 1

## 2021-11-07 MED ORDER — ALUM & MAG HYDROXIDE-SIMETH 200-200-20 MG/5ML PO SUSP
15.0000 mL | ORAL | Status: DC | PRN
  Administered 2021-11-10 (×2): 30 mL via ORAL
  Filled 2021-11-07 (×3): qty 30

## 2021-11-07 MED ORDER — ACETAMINOPHEN 325 MG PO TABS
325.0000 mg | ORAL_TABLET | ORAL | Status: DC | PRN
  Administered 2021-11-12 (×2): 650 mg via ORAL
  Filled 2021-11-07 (×2): qty 2

## 2021-11-07 MED ORDER — ACETAMINOPHEN 325 MG RE SUPP
325.0000 mg | RECTAL | Status: DC | PRN
  Filled 2021-11-07: qty 2

## 2021-11-07 MED ORDER — ENOXAPARIN SODIUM 120 MG/0.8ML IJ SOSY: 1.0000 mg/kg | PREFILLED_SYRINGE | Freq: Two times a day (BID) | INTRAMUSCULAR | 0 refills | Status: DC

## 2021-11-07 MED ORDER — HEPARIN (PORCINE) 25000 UT/250ML-% IV SOLN
2750.0000 [IU]/h | INTRAVENOUS | Status: DC
  Administered 2021-11-07 – 2021-11-09 (×5): 1800 [IU]/h via INTRAVENOUS
  Administered 2021-11-10: 1950 [IU]/h via INTRAVENOUS
  Administered 2021-11-11 – 2021-11-12 (×3): 2100 [IU]/h via INTRAVENOUS
  Administered 2021-11-12: 2250 [IU]/h via INTRAVENOUS
  Administered 2021-11-13: 2600 [IU]/h via INTRAVENOUS
  Filled 2021-11-07 (×11): qty 250

## 2021-11-07 MED ORDER — PHENOL 1.4 % MT LIQD
1.0000 | OROMUCOSAL | Status: DC | PRN
  Filled 2021-11-07: qty 177

## 2021-11-07 NOTE — ED Notes (Signed)
MD Howerter paged about critical lab results, MD [facility] messaged about lab result as well. ?

## 2021-11-07 NOTE — Progress Notes (Signed)
Internal Medicine Clinic Attending ? ?I saw and evaluated the patient.  I personally confirmed the key portions of the history and exam documented by Dr. Raymondo Band  ? and I reviewed pertinent patient test results.  The assessment, diagnosis, and plan were formulated together and I agree with the documentation in the resident?s note. ?Patient with hx of recurrent VTE and PAD presents with painful and discolored right foot.  Foot was purpleish when in dependent position on floor and color improved with elevation.  I could not palpate a pulse.  We discussed at length our concern for limb threatening PAD versus DVT however patient was adamant that he would not go to the ED for evaluation as he was previously able to get a STAT ultrasound.  We discussed with him change from xarelto to Lovenox and will attempt to obtain DVT and arterial studeies asap.  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 ?

## 2021-11-07 NOTE — Progress Notes (Signed)
ANTICOAGULATION CONSULT NOTE - Initial Consult ? ?Pharmacy Consult for Heparin ?Indication:  leg ischemia ? ?No Known Allergies ? ?Patient Measurements: ?Height: '6\' 3"'$  (190.5 cm) ?Weight: 108.9 kg (240 lb) ?IBW/kg (Calculated) : 84.5 ?Heparin Dosing Weight: 106.6 kg ? ?Vital Signs: ?Temp: 98.1 ?F (36.7 ?C) (04/21 1456) ?Temp Source: Oral (04/21 1456) ?BP: 108/83 (04/21 1456) ?Pulse Rate: 85 (04/21 1456) ? ?Labs: ?No results for input(s): HGB, HCT, PLT, APTT, LABPROT, INR, HEPARINUNFRC, HEPRLOWMOCWT, CREATININE, CKTOTAL, CKMB, TROPONINIHS in the last 72 hours. ? ?CrCl cannot be calculated (Patient's most recent lab result is older than the maximum 21 days allowed.). ? ? ?Medical History: ?Past Medical History:  ?Diagnosis Date  ? 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 Right 1st seen 11/14; thrombosed 11/15  ? 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 Right SFA 05/07/17 while on warfarin INR 2.9  ? 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 Right SFA 05/07/17 while on warfarin INR 2.9  ? Benign essential HTN 01/04/2012  ? Cancer [facility])   ? tonsilar  ? Chronic anticoagulation 01/02/2013  ? Dermatofibroma of forearm 01/02/2013  ? Left side  ? DVT, lower extremity, proximal ([place]) 05/24/2013  ? Right leg femoral & popliteal 05/24/13  ? Hyperlipidemia, mixed 01/04/2012  ? 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 Primary hypercoagulable state ([place]) 10/01/2014  ? Sinus bradycardia, chronic 01/04/2012  ? Superficial thrombosis of lower extremity 05/02/2012  ? ? ?Medications:  ?(Not in a hospital admission) ? ?Scheduled:  ?Infusions:  ?PRN:  ? 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 Patient is presenting from [facility] following visit for leg pain. [facility] sent to ED for [place] for r/o DVT and evaluation for ischemic limb. Heparin per pharmacy consult placed for  leg  ischemia . ? ?Patient is on Xarelto prior to arrival. Last dose 4/20 am. Will require aPTT monitoring due to likely falsely high anti-Xa level secondary to DOAC use. Patient was also prescribed lovenox at [facility] today but this has not been taken yet. ? ?Hgb pending; plt pending ? ?Goal of Therapy:  ?Heparin level 0.3-0.7 units/ml ?aPTT 66-102 seconds ?Monitor platelets by anticoagulation protocol: Yes ?  ?Plan:  ?Obtain baseline aPTT and Heparin levels ?No history of renal insufficiency and last dose of Xarelto > 24 hours ago -- will give 6000 units IV heparin bolus given concern for critical limb ischemia ?Start heparin infusion at 1800 units/hr ?Check aPTT & anti-Xa level in 6 hours and daily while on heparin ?Continue to monitor via aPTT until levels are correlated ?Continue to monitor H&H and platelets ? ?Lorelei Pont, PharmD, BCPS ?11/07/2021 3:56 PM ?ED Clinical Pharmacist -  [phone] ? ? ? ?

## 2021-11-07 NOTE — Patient Instructions (Signed)
Thank you, WalterWalter [name] for allowing [place] to provide your care today. Today we discussed: ? ?Leg pain: We are getting a STAT ultrasound of your right leg to evaluate for blood clots. We are also getting a test to look at the arterial blood flow in your leg. Please start taking lovenox injections once daily - you should not take your xarelto while you are on the lovenox. ? ?[name] [name]- if your pain worsens, if you are unable to bear any weight on your leg at all, please go to the nearest emergency department as soon as possible. You will likely need to see a vascular surgeon and that would be the quickest way to get in with them. ? ?I have ordered the following labs for you: ? ?Lab Orders  ?No laboratory test(s) ordered today  ?  ? ?Tests ordered today: ? ?DVT ultrasound ?ABIs ? ?Referrals ordered today:  ? ?Referral Orders  ?No referral(s) requested today  ?  ? ?I have ordered the following medication/changed the following medications:  ? ?Stop the following medications: ?There are no discontinued medications.  ? ?Start the following medications: ?Meds ordered this encounter  ?Medications  ? enoxaparin (LOVENOX) 120 MG/0.8ML injection  ?  Sig: Inject 0.72 mLs (108 mg total) into the skin daily.  ?  Dispense:  8 mL  ?  Refill:  0  ?  ? ?Follow up:  10 days to f/u leg pain   ? ? ?Remember: To go the ER if your pain worsens  ? ?Should you have any questions or concerns please call the internal medicine clinic at [phone].   ? ? ?Buddy Duty, D.O. ?[place] ? ? ?

## 2021-11-07 NOTE — Progress Notes (Signed)
Right lower extremity venous duplex completed and ABI. ?Refer to "CV Proc" under chart review to view preliminary results. ? ?11/07/2021 2:41 PM ?Kelby Aline., MHA, RVT, RDCS, RDMS  on behalf of Archie Patten, RVT ? ? ?

## 2021-11-07 NOTE — Progress Notes (Signed)
? ?CC: leg pain ? ?HPI: ? ?[name] [name] is a 61 y.o. 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 Please see problem-based list for further details, assessments, and plans. ? ? ?Past Medical History:  ?Diagnosis Date  ? 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 Right 1st seen 11/14; thrombosed 11/15  ? 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 Right SFA 05/07/17 while on warfarin INR 2.9  ? 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 Right SFA 05/07/17 while on warfarin INR 2.9  ? Benign essential HTN 01/04/2012  ? Cancer [facility])   ? tonsilar  ? Chronic anticoagulation 01/02/2013  ? Dermatofibroma of forearm 01/02/2013  ? Left side  ? DVT, lower extremity, proximal ([place]) 05/24/2013  ? Right leg femoral & popliteal 05/24/13  ? Hyperlipidemia, mixed 01/04/2012  ? 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 Primary hypercoagulable state ([place]) 10/01/2014  ? Sinus bradycardia, chronic 01/04/2012  ? Superficial thrombosis of lower extremity 05/02/2012  ? ?Review of Systems:  Review of Systems  ?Constitutional:  Negative for chills and fever.  ?Respiratory: Negative.    ?Cardiovascular:  Positive for claudication. Negative for chest pain and palpitations.  ?Gastrointestinal: Negative.   ?Genitourinary: Negative.   ?Musculoskeletal:   ?     Right leg pain ?  ?Skin:   ?     Discoloration (blue/purple color to right foot) ?Cold RLE  ?Neurological:  Negative for dizziness and headaches.  ? ? ?Physical Exam: ? ?Vitals:  ? 11/07/21 1058  ?BP: 123/85  ?Pulse: 86  ?Temp: 98.1 ?F (36.7 ?C)  ?TempSrc: Oral  ?SpO2: 98%  ?Weight: 238 lb 6.4 oz (108.1 kg)  ? ?General: Pleasant, well-appearing male. No acute distress. ?CV: RRR. No murmurs, rubs, or gallops.  ?Pulmonary: Lungs CTAB. Normal effort.  ?Extremities: Palpable DP pulse on left foot, unable to palpate DP  pulse on right foot.  ?Skin: Right foot is cold and dry. Skin is cyanotic.  ?Neuro: A&Ox3. No focal deficit. ?Psych: Normal mood and affect ? ? ? ? ?Assessment & Plan:  ? ?See Encounters Tab for problem based charting. ? ?Patient seen with Dr. Heber [place] ? ?PAD (peripheral artery disease) ([place]) ?Patient has a history of severe PAD and recurrent DVTs (previously on warfarin, then eliquis) now on xarelto. He presents to the [facility] today to discuss severe pain in his right leg and foot.  Patient states that for the last 2 weeks his foot has become very painful, especially when walking or bearing weight on it.  When he is not elevating his leg he states that his foot becomes purple and is extremely cold to the touch. He is having what sounds like claudication symptoms of his RLE.  He has previously followed with vascular surgery and had been referred to them back in January, however, he does not have an appointment until May due to difficulties with their office getting in touch with him.  ? ?On exam, I am unable to palpate his DP pulse on the right foot and his foot is extremely cold to the touch.  He is also unable to wiggle his toes aside from his big toe on the right.  His skin is cyanotic and gets darker when he is bearing weight on it.  When the patient elevates his leg, his skin color returns to normal. ? ?Plan: ?Dr. Heber Roberts and I  strongly recommended the patient go to the emergency department to have a STAT ultrasound to rule out DVT as well as to get formal ABIs done (especially as it is already Friday afternoon at this point).  In addition, he is at high risk for ischemic limb and may need to urgently see vascular surgery, as he is at risk for losing his limb.  He does not have an appointment with vascular surgery until May 11 and we mentioned that he likely needs to see them much sooner than this.  We discussed all of this at length with the patient and he refused to go to the emergency department and stated that  he only wants to get the DVT ultrasound done.  Again I strongly recommended he go to the ED, especially if his pain worsens or if his skin color becomes darker colder to the touch. Patient understands the risks of not going to the ED for emergent evaluation at this time. ? ?In the interim, I have ordered a stat DVT ultrasound which will be done today.  I had ordered ABIs, however, this cannot be done today and has already been ordered by vascular surgery, thus the order was cancelled.  We are also starting the patient on Lovenox 1 mg/kg (108 mg total) twice daily for 10 days.  He has a follow-up appointment in our clinic on May 2 which I advised him to keep so we can follow-up on his leg pain and I also recommended that he call the vascular surgeons to be seen sooner. Advised the patient to HOLD his xarelto while he is on lovenox.  ? ? ? ?

## 2021-11-07 NOTE — ED Triage Notes (Signed)
Pt arrived POV from home c/o right foot pain/leg pain x1 month. Pt states he has poor circulation. Pt's right foot is blue and discolored. Unable to locate a pulse with the doppler.  ?

## 2021-11-07 NOTE — Consult Note (Signed)
[facility]  ? ?ASSESSMENT / PLAN: ?61 y.o. male with acutely ischemic right lower extremity ([place] 1 symptoms). 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 He had a right common femoral ectasia on CT angiogram in 2020. His right common femoral artery is thrombosed on duplex today. He last took Xarelto last night. I have ordered a CT angiogram to better plan an intervention. I counseled the patient he is at high risk for limb loss, even if revascularization is technically successful. ? ?CHIEF COMPLAINT: right leg pain ? ?HISTORY OF PRESENT ILLNESS: ?[name] [name] is a 61 y.o. male with right lower extremity pain and numbness in the right lower extremity for a month. He describes fairly typical claudication-type symptoms over that time frame. He reports development of a right great toe small chronic wound over the past several days. He was sent for a right leg venous duplex when his right common femoral artery was noted to be thrombosed.  ? ?His right popliteal artery aneurysm has been thrombosed since at least 2015. At that time he had an ABI of 0.74. Most recent ABI in 2018 was 0.63. ABI today is 0 on the right. A CT scan done in 2020 shows an ectatic right common femoral artery aneurysm with mural thrombus.  ? ?VASCULAR RISK FACTORS: ?Negative history of stroke / transient ischemic attack. ?Negative history of coronary artery disease.  ?Negative history of diabetes mellitus.  ?Positive history of smoking. + actively smoking. ?Positive history of hypertension.  ?Negative history of chronic kidney disease.   ?Negative history of chronic obstructive pulmonary disease. ? ?FUNCTIONAL STATUS: ?ECOG performance status: (0) Fully active, able to carry on all predisease performance without restriction ?Ambulatory status: Ambulatory within the community without limits ? ?Past Medical History:  ?Diagnosis Date  ? Aneurysm  artery, popliteal ([place]) 10/01/2014  ? Right 1st seen 11/14; thrombosed 11/15  ? 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 Right SFA 05/07/17 while on warfarin INR 2.9  ? 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 Right SFA 05/07/17 while on warfarin INR 2.9  ? Benign essential HTN 01/04/2012  ? 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 Chronic anticoagulation 01/02/2013  ? Dermatofibroma of forearm 01/02/2013  ? Left side  ? DVT, lower extremity, proximal ([place]) 05/24/2013  ? Right leg femoral & popliteal 05/24/13  ? Hyperlipidemia, mixed 01/04/2012  ? 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 Primary hypercoagulable state ([place]) 10/01/2014  ? Sinus bradycardia, chronic 01/04/2012  ? Superficial thrombosis of lower extremity 05/02/2012  ? ? ?Past Surgical History:  ?Procedure Laterality Date  ? DIRECT LARYNGOSCOPY Left 10/19/2018  ? Procedure: DIRECT LARYNGOSCOPY;  Surgeon: Leta Baptist, MD;  Location: [place];  Service: ENT;  Laterality: Left;  ? IR IMAGING GUIDED PORT INSERTION  11/04/2018  ? IR REMOVAL TUN ACCESS W/ PORT W/O FL MOD SED  05/01/2019  ? TONSILLECTOMY Left 10/19/2018  ? Procedure: BIOPSY OF LEFT TONSIL;  Surgeon: Leta Baptist, MD;  Location: [place];  Service: ENT;  Laterality: Left;  ? ? ?Family History  ?Problem Relation Age of Onset  ? Stroke Father   ? ? ?Social History  ? ?Socioeconomic History  ? Marital status: Divorced  ?  Spouse name: Not on file  ? Number of children: 2  ? Years of education: Not on file  ? Highest education level:  Not on file  ?Occupational History  ? Not on file  ?Tobacco Use  ? Smoking status: Light Smoker  ?  Packs/day: 0.50  ?  Years: 30.00  ?  Pack years: 15.00  ?  Types: Cigarettes  ?  Last attempt to quit: 10/19/2018  ?  Years since quitting: 3.0  ? Smokeless tobacco: Never  ?Vaping Use  ? Vaping Use: Former  ? Substances: Nicotine, Flavoring  ? Devices: 1 cartridge q2-3 days, he tells me he is not using.    ?Substance and Sexual Activity  ? Alcohol use: Yes  ?  Alcohol/week: 13.0 - 14.0 standard drinks  ?  Types: 12 Cans of beer, 1 - 2 Shots of liquor per week  ?  Comment: 1-2 times per week.  ? Drug use: No  ? Sexual activity: Not on file  ?Other Topics Concern  ? Not on file  ?Social History Narrative  ? Not on file  ? ?Social Determinants of Health  ? ?Financial Resource Strain: Not on file  ?Food Insecurity: Not on file  ?Transportation Needs: Not on file  ?Physical Activity: Not on file  ?Stress: Not on file  ?Social Connections: Not on file  ?Intimate Partner Violence: Not on file  ? ? ?No Known Allergies ? ?No current facility-administered medications for this encounter.  ? ?Current Outpatient Medications  ?Medication Sig Dispense Refill  ? atorvastatin (LIPITOR) 20 MG tablet Take 1 tablet (20 mg total) by mouth daily. 90 tablet 11  ? cyclobenzaprine (FLEXERIL) 10 MG tablet TAKE 1 TABLET BY MOUTH THREE TIMES A DAY AS NEEDED FOR MUSCLE SPASMS 30 tablet 0  ? enoxaparin (LOVENOX) 120 MG/0.8ML injection Inject 0.72 mLs (108 mg total) into the skin every 12 (twelve) hours for 10 days. 15 mL 0  ? gabapentin (NEURONTIN) 100 MG capsule Take 100 mg by mouth every 8 (eight) hours as needed.    ? glycopyrrolate (ROBINUL) 2 MG tablet Take 1 tablet (2 mg total) by mouth 3 (three) times daily as needed (mouth secretions). 30 tablet 0  ? levothyroxine (SYNTHROID) 25 MCG tablet Take 1 tablet (25 mcg total) by mouth daily before breakfast. Take on empty stomach w/ water, 1 hour before other meds or food. 90 tablet 1  ? metoprolol succinate (TOPROL-XL) 25 MG 24 hr tablet TAKE 1 TABLET (25 MG TOTAL) BY MOUTH DAILY. 90 tablet 1  ? ondansetron (ZOFRAN-ODT) 4 MG disintegrating tablet Take 1 tablet (4 mg total) by mouth every 8 (eight) hours as needed for nausea or vomiting. 20 tablet 0  ? oxyCODONE (OXY IR/ROXICODONE) 5 MG immediate release tablet Take 5 mg by mouth every 8 (eight) hours as needed.    ? rivaroxaban (XARELTO) 20 MG  TABS tablet Take 1 tablet (20 mg total) by mouth daily with supper. 90 tablet 3  ? senna-docusate (SENOKOT-S) 8.6-50 MG tablet Take 1 tablet by mouth daily. 90 tablet 5  ? sodium fluoride (PREVIDENT 5000 PLUS) 1.1 % CREA dental cream Apply to tooth brush. Brush teeth for 2 minutes. Spit out excess. DO NOT rinse afterwards. Repeat nightly. 1 Tube prn  ? traZODone (DESYREL) 50 MG tablet Take 1 tablet (50 mg total) by mouth at bedtime. 90 tablet 1  ? vitamin E 180 MG (400 UNITS) capsule Take 400 IU daily x 1 week, then 400 IU BID 60 capsule 5  ? ? ?PHYSICAL EXAM ?Vitals:  ? 11/07/21 1452 11/07/21 1456  ?BP:  108/83  ?Pulse:  85  ?Resp:  18  ?  Temp:  98.1 ?F (36.7 ?C)  ?TempSrc:  Oral  ?SpO2:  95%  ?Weight: 108.9 kg   ?Height: '6\' 3"'$  (1.905 m)   ? ? ?Constitutional: well appearing. no distress. Appears well nourished.  ?Neurologic: CN intact. no focal findings. no sensory loss. ?Psychiatric:  Mood and affect symmetric and appropriate. ?Eyes:  No icterus. No conjunctival pallor. ?Ears, nose, throat:  mucous membranes moist. Midline trachea.  ?Cardiac: regular rate and rhythm.  ?Respiratory:  unlabored. ?Abdominal:  soft, non-tender, non-distended.  ?Peripheral vascular: 2+ L DP. Absent R pedal pulses. Absent R femoral pulse. No doppler flow in R foot.  ?Extremity: no edema. no cyanosis. + pallor.  ?Skin: no gangrene. Small focus of ulceration in right great toe nail bed.  ?Lymphatic: no Stemmer's sign. no palpable lymphadenopathy. ? ?PERTINENT LABORATORY AND RADIOLOGIC DATA ? ?Most recent CBC ? ?  Latest Ref Rng & Units 09/22/2021  ?  5:40 PM 08/27/2021  ?  2:59 PM 02/21/2021  ?  3:23 PM  ?CBC  ?WBC 4.0 - 10.5 K/uL 7.9   5.3   4.5    ?Hemoglobin 13.0 - 17.0 g/dL 16.2   15.9   16.1    ?Hematocrit 39.0 - 52.0 % 48.7   46.2   45.5    ?Platelets 150 - 400 K/uL 247   155   151    ?  ? ?Most recent CMP ? ?  Latest Ref Rng & Units 09/22/2021  ?  5:40 PM 08/27/2021  ?  2:59 PM 02/21/2021  ?  3:23 PM  ?CMP  ?Glucose 70 - 99 mg/dL 99   97   95     ?BUN 8 - 23 mg/dL '12   12   9    '$ ?Creatinine 0.61 - 1.24 mg/dL 0.76   0.93   0.89    ?Sodium 135 - 145 mmol/L 139   136   139    ?Potassium 3.5 - 5.1 mmol/L 3.8   3.7   4.3    ?Chloride 98 - 111 mmol/L

## 2021-11-07 NOTE — ED Notes (Signed)
Patient transported to CT 

## 2021-11-07 NOTE — Assessment & Plan Note (Addendum)
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 He presents to the [facility] today to discuss severe pain in his right leg and foot.  Patient states that for the last 2 weeks his foot has become very painful, especially when walking or bearing weight on it.  When he is not elevating his leg he states that his foot becomes purple and is extremely cold to the touch. He is having what sounds like claudication symptoms of his RLE.  He has previously followed with vascular surgery and had been referred to them back in January, however, he does not have an appointment until May due to difficulties with their office getting in touch with him.  ? ?On exam, I am unable to palpate his DP pulse on the right foot and his foot is extremely cold to the touch.  He is also unable to wiggle his toes aside from his big toe on the right.  His skin is cyanotic and gets darker when he is bearing weight on it.  When the patient elevates his leg, his skin color returns to normal. ? ?Plan: ?Dr. Heber [place] and I strongly recommended the patient go to the emergency department to have a STAT ultrasound to rule out DVT as well as to get formal ABIs done (especially as it is already Friday afternoon at this point).  In addition, he is at high risk for ischemic limb and may need to urgently see vascular surgery, as he is at risk for losing his limb.  He does not have an appointment with vascular surgery until May 11 and we mentioned that he likely needs to see them much sooner than this.  We discussed all of this at length with the patient and he refused to go to the emergency department and stated that he only wants to get the DVT ultrasound done.  Again I strongly recommended he go to the ED, especially if his pain worsens or if his skin color becomes darker colder to the touch. Patient understands the risks of not going to the ED for emergent evaluation at this time. ? ?In the interim, I  have ordered a stat DVT ultrasound which will be done today.  I had ordered ABIs, however, this cannot be done today and has already been ordered by vascular surgery, thus the order was cancelled.  We are also starting the patient on Lovenox 1 mg/kg (108 mg total) twice daily for 10 days.  He has a follow-up appointment in our clinic on May 2 which I advised him to keep so we can follow-up on his leg pain and I also recommended that he call the vascular surgeons to be seen sooner. Advised the patient to HOLD his xarelto while he is on lovenox.  ?

## 2021-11-07 NOTE — ED Notes (Signed)
Blood work redrawn for heparin levels. ?

## 2021-11-07 NOTE — ED Provider Notes (Signed)
?[facility] ?Provider Note ? ? ?CSN: [id number] ?Arrival date & time: 11/07/21  1435 ? ?  ? ?History ? ?Chief Complaint  ?Patient presents with  ? Foot Pain  ? ? ?[name] [name] is a 61 y.o. male. ? ? ?Foot Pain ?Patient is a 61 year old male with past medical history significant for peripheral artery disease, hyperlipidemia, current smoker, HTN, recurrent DVTs, popliteal artery aneurysm he is on Xarelto currently has formed recurrent DVTs on Eliquis.  Has historically been on Coumadin but was moved to Xarelto because of hope for fewer INR testing ? ?Seems that over the past month he has had right lower extremity pain he has had claudication symptoms in the past with the symptoms worsen he has pain and swelling in his foot when it is in a dependent position. ? ? ?He was sent to have a DVT study and ABI done today which were abnormal he was sent to the emergency room. ? ? ?Patient has had vascular studies in the past that showed popliteal artery aneurysm with thrombus however no history of femoral artery aneurysm on my review of EMR ? ?  ? ?Home Medications ?Prior to Admission medications   ?Medication Sig Start Date End Date Taking? Authorizing Provider  ?atorvastatin (LIPITOR) 20 MG tablet Take 1 tablet (20 mg total) by mouth daily. 08/18/21 08/18/22 Yes Christian, Rylee, MD  ?cephALEXin (KEFLEX) 500 MG capsule Take 500 mg by mouth 2 (two) times daily. 11/06/21  Yes [provider]  ?cyclobenzaprine (FLEXERIL) 10 MG tablet TAKE 1 TABLET BY MOUTH THREE TIMES A DAY AS NEEDED FOR MUSCLE SPASMS ?Patient taking differently: Take 10 mg by mouth 3 (three) times daily as needed for muscle spasms. 08/27/21  Yes Orson Slick, MD  ?gabapentin (NEURONTIN) 100 MG capsule Take 100 mg by mouth every 8 (eight) hours as needed (For pain). 11/10/19  Yes [provider]  ?levothyroxine (SYNTHROID) 25 MCG tablet Take 1 tablet (25 mcg total) by mouth daily before breakfast. Take on  empty stomach w/ water, 1 hour before other meds or food. 08/18/21  Yes Christian, Rylee, MD  ?metoprolol succinate (TOPROL-XL) 25 MG 24 hr tablet TAKE 1 TABLET (25 MG TOTAL) BY MOUTH DAILY. ?Patient taking differently: Take 25 mg by mouth every evening. 08/07/21  Yes Christian, Rylee, MD  ?ondansetron (ZOFRAN-ODT) 4 MG disintegrating tablet Take 1 tablet (4 mg total) by mouth every 8 (eight) hours as needed for nausea or vomiting. 09/22/21  Yes Duffy Bruce, MD  ?oxyCODONE (OXY IR/ROXICODONE) 5 MG immediate release tablet Take 5 mg by mouth every 8 (eight) hours as needed for moderate pain. 10/17/19  Yes [provider]  ?rivaroxaban (XARELTO) 20 MG TABS tablet Take 1 tablet (20 mg total) by mouth daily with supper. 08/14/21  Yes Christian, Rylee, MD  ?senna-docusate (SENOKOT-S) 8.6-50 MG tablet Take 1 tablet by mouth daily. 08/18/21  Yes Christian, Rylee, MD  ?traZODone (DESYREL) 50 MG tablet Take 1 tablet (50 mg total) by mouth at bedtime. ?Patient taking differently: Take 50 mg by mouth at bedtime as needed for sleep. 08/18/21  Yes Christian, Rylee, MD  ?vitamin E 180 MG (400 UNITS) capsule Take 400 IU daily x 1 week, then 400 IU BID ?Patient taking differently: Take 400 Units by mouth daily. 09/08/19  Yes Eppie Gibson, MD  ?enoxaparin (LOVENOX) 120 MG/0.8ML injection Inject 0.72 mLs (108 mg total) into the skin every 12 (twelve) hours for 10 days. ?Patient not taking: Reported on 11/07/2021  11/07/21 11/17/21  [place], Jeananne Rama, DO  ?glycopyrrolate (ROBINUL) 2 MG tablet Take 1 tablet (2 mg total) by mouth 3 (three) times daily as needed (mouth secretions). ?Patient not taking: Reported on 11/07/2021 09/22/21   Duffy Bruce, MD  ?sodium fluoride (PREVIDENT 5000 PLUS) 1.1 % CREA dental cream Apply to tooth brush. Brush teeth for 2 minutes. Spit out excess. DO NOT rinse afterwards. Repeat nightly. ?Patient not taking: Reported on 11/07/2021 10/24/18   Lenn Cal, DDS  ?   ? ?Allergies    ?Patient has no known  allergies.   ? ?Review of Systems   ?Review of Systems ? ?Physical Exam ?Updated Vital Signs ?BP 136/89   Pulse 98   Temp 98.1 ?F (36.7 ?C) (Oral)   Resp 18   Ht '6\' 3"'$  (1.905 m)   Wt 108.9 kg   SpO2 97%   BMI 30.00 kg/m?  ?Physical Exam ?Vitals and nursing note reviewed.  ?Constitutional:   ?   General: He is not in acute distress. ?HENT:  ?   Head: Normocephalic and atraumatic.  ?   Nose: Nose normal.  ?Eyes:  ?   General: No scleral icterus. ?Cardiovascular:  ?   Rate and Rhythm: Normal rate and regular rhythm.  ?   Pulses: Normal pulses.  ?   Heart sounds: Normal heart sounds.  ?   Comments: Pulseless right lower extremity with purple coloration and cool to touch.  Right great toe with small wound at base of nail ?Pulmonary:  ?   Effort: Pulmonary effort is normal. No respiratory distress.  ?   Breath sounds: No wheezing.  ?Abdominal:  ?   Palpations: Abdomen is soft.  ?   Tenderness: There is no abdominal tenderness.  ?Musculoskeletal:  ?   Cervical back: Normal range of motion.  ?   Right lower leg: No edema.  ?   Left lower leg: No edema.  ?Skin: ?   General: Skin is warm and dry.  ?   Capillary Refill: Capillary refill takes less than 2 seconds.  ?Neurological:  ?   Mental Status: He is alert. Mental status is at baseline.  ?Psychiatric:     ?   Mood and Affect: Mood normal.     ?   Behavior: Behavior normal.  ? ? ? ?ED Results / Procedures / Treatments   ?Labs ?(all labs ordered are listed, but only abnormal results are displayed) ?Labs Reviewed  ?HEPARIN LEVEL (UNFRACTIONATED) - Abnormal; Notable for the following components:  ?    Result Value  ? Heparin Unfractionated <0.10 (*)   ? All other components within normal limits  ?APTT - Abnormal; Notable for the following components:  ? aPTT 22 (*)   ? All other components within normal limits  ?BASIC METABOLIC PANEL  ?PROTIME-INR  ?CBC  ?HEPARIN LEVEL (UNFRACTIONATED)  ?APTT  ?HEPARIN LEVEL (UNFRACTIONATED)  ?APTT  ? ? ?EKG ?None ? ?Radiology ?CT ANGIO  AO+BIFEM W & OR WO CONTRAST ? ?Result Date: 11/07/2021 ?CLINICAL DATA:  Pt arrived POV from home c/o right foot pain/leg pain x1 month. Pt states he has poor circulation. Pt's right foot is blue and discolored. Unable to locate a pulse with the doppler. EXAM: CT ANGIOGRAPHY OF ABDOMINAL AORTA WITH ILIOFEMORAL RUNOFF TECHNIQUE: Multidetector CT imaging of the abdomen, pelvis and lower extremities was performed using the standard protocol during bolus administration of intravenous contrast. Multiplanar CT image reconstructions and MIPs were obtained to evaluate the vascular anatomy. RADIATION DOSE REDUCTION: This  exam was performed according to the departmental dose-optimization program which includes automated exposure control, adjustment of the mA and/or kV according to patient size and/or use of iterative reconstruction technique. CONTRAST:  134m OMNIPAQUE IOHEXOL 350 MG/ML SOLN COMPARISON:  None. FINDINGS: VASCULAR Aorta: Severe calcified and noncalcified atherosclerotic plaque of the infrarenal abdominal aorta. Normal caliber aorta without aneurysm, dissection, vasculitis or significant stenosis. Celiac: Patent without evidence of aneurysm, dissection, vasculitis or significant stenosis. SMA: Patent without evidence of aneurysm, dissection, vasculitis or significant stenosis. Renals: Both renal arteries are patent without evidence of aneurysm, dissection, vasculitis, fibromuscular dysplasia or significant stenosis. IMA: Patent without evidence of aneurysm, dissection, vasculitis or significant stenosis. RIGHT Lower Extremity Inflow/outflow: Aneurysmal dilatation of the right common iliac artery measuring up to 3.5 cm.Complete occlusion of the origin of the right external iliac artery. No vascular flow throughout the right external iliac, common femoral, superficial femoral, or popliteal arteries. The right internal iliac artery is patent. Query saccular aneurysm of the origin of the right intramural arc artery  measuring up to 1.5 cm (6:115). Query calcified atherosclerotic plaque within the right popliteal artery (6:290). Runoff: Mild opacification/vascular flow noted within the right leg due to collaterals. LEFT Lo

## 2021-11-08 ENCOUNTER — Encounter (HOSPITAL_COMMUNITY): Payer: Self-pay | Admitting: Vascular Surgery

## 2021-11-08 DIAGNOSIS — I70221 Atherosclerosis of native arteries of extremities with rest pain, right leg: Secondary | ICD-10-CM | POA: Diagnosis not present

## 2021-11-08 DIAGNOSIS — I4821 Permanent atrial fibrillation: Secondary | ICD-10-CM | POA: Diagnosis not present

## 2021-11-08 DIAGNOSIS — Z01810 Encounter for preprocedural cardiovascular examination: Secondary | ICD-10-CM

## 2021-11-08 LAB — COMPREHENSIVE METABOLIC PANEL
ALT: 25 U/L (ref 0–44)
AST: 28 U/L (ref 15–41)
Albumin: 3.2 g/dL — ABNORMAL LOW (ref 3.5–5.0)
Alkaline Phosphatase: 85 U/L (ref 38–126)
Anion gap: 8 (ref 5–15)
BUN: 12 mg/dL (ref 8–23)
CO2: 26 mmol/L (ref 22–32)
Calcium: 9.1 mg/dL (ref 8.9–10.3)
Chloride: 105 mmol/L (ref 98–111)
Creatinine, Ser: 0.88 mg/dL (ref 0.61–1.24)
GFR, Estimated: >60 mL/min (ref >60)
Glucose, Bld: 102 mg/dL — ABNORMAL HIGH (ref 70–99)
Potassium: 3.8 mmol/L (ref 3.5–5.1)
Sodium: 139 mmol/L (ref 135–145)
Total Bilirubin: 0.7 mg/dL (ref 0.3–1.2)
Total Protein: 6.2 g/dL — ABNORMAL LOW (ref 6.5–8.1)

## 2021-11-08 LAB — CBC
HCT: 41.6 % (ref 39.0–52.0)
Hemoglobin: 14.2 g/dL (ref 13.0–17.0)
MCH: 32.3 pg (ref 26.0–34.0)
MCHC: 34.1 g/dL (ref 30.0–36.0)
MCV: 94.5 fL (ref 80.0–100.0)
Platelets: 206 10*3/uL (ref 150–400)
RBC: 4.4 MIL/uL (ref 4.22–5.81)
RDW: 13.5 % (ref 11.5–15.5)
WBC: 6.6 10*3/uL (ref 4.0–10.5)
nRBC: 0 % (ref 0.0–0.2)

## 2021-11-08 LAB — HEPARIN LEVEL (UNFRACTIONATED): Heparin Unfractionated: 0.5 IU/mL (ref 0.30–0.70)

## 2021-11-08 MED ORDER — LEVOTHYROXINE SODIUM 25 MCG PO TABS
25.0000 ug | ORAL_TABLET | Freq: Every day | ORAL | Status: DC
  Administered 2021-11-08 – 2021-11-13 (×6): 25 ug via ORAL
  Filled 2021-11-08 (×6): qty 1

## 2021-11-08 MED ORDER — METOPROLOL SUCCINATE ER 25 MG PO TB24
25.0000 mg | ORAL_TABLET | Freq: Every day | ORAL | Status: DC
  Administered 2021-11-08 – 2021-11-13 (×6): 25 mg via ORAL
  Filled 2021-11-08 (×6): qty 1

## 2021-11-08 MED ORDER — CEPHALEXIN 500 MG PO CAPS
500.0000 mg | ORAL_CAPSULE | Freq: Two times a day (BID) | ORAL | Status: DC
  Administered 2021-11-08 – 2021-11-12 (×10): 500 mg via ORAL
  Filled 2021-11-08 (×10): qty 1

## 2021-11-08 MED ORDER — ATORVASTATIN CALCIUM 10 MG PO TABS
20.0000 mg | ORAL_TABLET | Freq: Every day | ORAL | Status: DC
  Administered 2021-11-08: 20 mg via ORAL
  Filled 2021-11-08: qty 2

## 2021-11-08 NOTE — Progress Notes (Signed)
[facility] [place] ?PROGRESS NOTE ? ?ASSESSMENT / PLAN: ?[name] [name] is a 61 y.o. 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  He has early ulceration of the right great toe.  I think he needs inline flow to salvage his leg.  ? ?At a minimum, this would require a left to right femoral-femoral bypass with a right femoral tibial bypass.  This would not treat his right common iliac artery aneurysm, which I feel is embolizing.  It would also not preserve flow into his right hypogastric artery. ? ?A more durable repair would be an aorto bifemoral, aorto unifemoral bypass, or aorto hypogastric bypass with jump graft to the right profunda femoris artery followed by femoral-tibial bypass.  This would treat all of his pelvic vascular pathology, but would expose him to perioperative risk of stroke, MI, pneumonia, kidney failure, even death. ? ?I have asked the cardiology service to evaluate him to see if he has any features that would disqualify him from an open aortic reconstruction.  I greatly appreciate their help. ? ?Thankfully, his foot is stable and he is able to tolerate his present level of ischemia.  I think heparin has had a beneficial effect, and he has developed capillary refill in the foot. ? ?Plan for conventional angiogram on Monday with Dr. Donzetta Matters to identify a distal target (if any).  Definitive surgery will likely be Wednesday. ? ?SUBJECTIVE: ?Right foot feels about the same after initiation of heparin.  Overall it is improved from his preadmission discomfort.  I had a lengthy discussion with him and his family about risks, benefits, and options available to [place] for reconstruction. ? ?OBJECTIVE: ?BP 124/86 (BP Location: Left Arm)   Pulse 87   Temp 97.6 ?F (36.4 ?C) (Oral)   Resp 20   Ht '6\' 3"'$  (1.905 m)   Wt 108 kg   SpO2 94%   BMI 29.76 kg/m?  ?No intake or output data in the 24 hours ending 11/08/21 1259   ? ?No acute distress ?Irregular heart rate ?Unlabored breathing ?Soft abdomen ?Right foot hyperemic.  Improved capillary refill. ? ? ?  Latest Ref Rng & Units 11/08/2021  ?  4:00 AM 11/07/2021  ?  4:03 PM 09/22/2021  ?  5:40 PM  ?CBC  ?WBC 4.0 - 10.5 K/uL 6.6   6.1   7.9    ?Hemoglobin 13.0 - 17.0 g/dL 14.2   14.8   16.2    ?Hematocrit 39.0 - 52.0 % 41.6   43.3   48.7    ?Platelets 150 - 400 K/uL 206   241   247    ?  ? ? ?  Latest Ref Rng & Units 11/08/2021  ?  4:00 AM 11/07/2021  ?  4:03 PM 09/22/2021  ?  5:40 PM  ?CMP  ?Glucose 70 - 99 mg/dL 102   91   99    ?BUN 8 - 23 mg/dL '12   13   12    '$ ?Creatinine 0.61 - 1.24 mg/dL 0.88   0.98   0.76    ?Sodium 135 - 145 mmol/L 139   141   139    ?Potassium 3.5 - 5.1 mmol/L 3.8   4.5   3.8    ?Chloride 98 - 111 mmol/L 105   106   102    ?CO2 22 - 32 mmol/L '26   27   27    '$ ?Calcium 8.9 -  10.3 mg/dL 9.1   9.3   9.3    ?Total Protein 6.5 - 8.1 g/dL 6.2    7.4    ?Total Bilirubin 0.3 - 1.2 mg/dL 0.7    0.8    ?Alkaline Phos 38 - 126 U/L 85    80    ?AST 15 - 41 U/L 28    27    ?ALT 0 - 44 U/L 25    23    ? ? ?Estimated Creatinine Clearance: 117.1 mL/min (by C-G formula based on SCr of 0.88 mg/dL). ? ?[name] [name]. [name], MD ?[facility] [place] ?Office Phone Number: [phone] ?11/08/2021 12:59 PM ? ? ? ?

## 2021-11-08 NOTE — Consult Note (Signed)
? ?Cardiology Consultation:  ? ?Patient ID: [name] [name]; [id number]; April 17, 1961  ? ?Admit date: 11/07/2021 ?Date of Consult: 11/08/2021 ? ?Primary Care Provider: [name], MD ?Primary Cardiologist:  New to [facility] ?Primary Electrophysiologist: None ? ? ?Patient Profile:  ? ?[name] [name] is a 61 y.o. male with a history of tonsillar squamous cell carcinoma, atrial fibrillation, hypertension, hyperlipidemia, tobacco abuse, and PAD who is being seen today for preoperative cardiac evaluation at the request of Dr. [name]. ? ?History of Present Illness:  ? ?[name] [name] is currently admitted to the hospital with symptomatic PAD and ischemic right lower extremity symptoms.  This has been getting worse over the last month on baseline claudication.  Has had a small wound on his right great toe as well.  CTA imaging shows progressive severe PAD as reported below with plan per Dr. [name] to consider inpatient surgical revascularization. ? ?[name] [name] has not had any regular cardiology follow-up, although apparently a greater than 10-year history of atrial fibrillation per his report.  He is on long-term anticoagulation due to hypercoagulable state and remote history of pulmonary embolus.  His CHA2DS2-VASc score is 2 based on available information.  He is not aware of any history of ischemic heart disease or myocardial infarction, also no prior documented cardiomyopathy. ? ?I personally reviewed his recent ECGs which show atrial fibrillation with right bundle branch block and left anterior fascicular block, no pathologic Q waves. ? ?At the present time he is on IV heparin, Xarelto is being held in anticipation of surgery. ? ?He has not undergone any recent cardiac structural or ischemic evaluation. ? ? ?Past Medical History:  ?Diagnosis Date  ? 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 Right 1st seen 11/14; thrombosed 11/15  ? 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 Right SFA 05/07/17  while on warfarin INR 2.9  ? 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 Right SFA 05/07/17 while on warfarin INR 2.9  ? Atrial fibrillation ([place])   ? Benign essential HTN 01/04/2012  ? Chronic anticoagulation 01/02/2013  ? Dermatofibroma of forearm 01/02/2013  ? Left side  ? DVT, lower extremity, proximal ([place]) 05/24/2013  ? Right leg femoral & popliteal 05/24/13  ? Hyperlipidemia, mixed 01/04/2012  ? 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 Primary hypercoagulable state ([place]) 10/01/2014  ? 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 Sinus bradycardia, chronic 01/04/2012  ? Superficial thrombosis of lower extremity 05/02/2012  ? ? ?Past Surgical History:  ?Procedure Laterality Date  ? DIRECT LARYNGOSCOPY Left 10/19/2018  ? Procedure: DIRECT LARYNGOSCOPY;  Surgeon: Leta Baptist, MD;  Location: [place];  Service: ENT;  Laterality: Left;  ? IR IMAGING GUIDED PORT INSERTION  11/04/2018  ? IR REMOVAL TUN ACCESS W/ PORT W/O FL MOD SED  05/01/2019  ? TONSILLECTOMY Left 10/19/2018  ? Procedure: BIOPSY OF LEFT TONSIL;  Surgeon: Leta Baptist, MD;  Location: [place];  Service: ENT;  Laterality: Left;  ?  ? ?Inpatient Medications: ?Scheduled Meds: ? atorvastatin  20 mg Oral Daily  ? cephALEXin  500 mg Oral Q12H  ? docusate sodium  100 mg Oral BID  ? levothyroxine  25 mcg Oral Q0600  ? metoprolol succinate  25 mg Oral Daily  ? nicotine  14 mg Transdermal Once  ? pantoprazole  40 mg Oral Daily  ? potassium chloride  20-40 mEq Oral Once  ? ?Continuous Infusions: ? sodium chloride    ?  heparin 1,800 Units/hr (11/08/21 0321)  ? lactated ringers 100 mL/hr at 11/08/21 0835  ? ?PRN Meds: ?acetaminophen **OR** acetaminophen, alum & mag hydroxide-simeth, bisacodyl, guaiFENesin-dextromethorphan, hydrALAZINE, HYDROmorphone (DILAUDID) injection, labetalol, metoprolol tartrate, ondansetron, oxyCODONE-acetaminophen, phenol, senna-docusate ? ?Allergies:   No Known  Allergies ? ?Social History:   ?Social History  ? ?Tobacco Use  ? Smoking status: Light Smoker  ?  Packs/day: 0.50  ?  Years: 30.00  ?  Pack years: 15.00  ?  Types: Cigarettes  ?  Last attempt to quit: 10/19/2018  ?  Years since quitting: 3.0  ? Smokeless tobacco: Never  ?Substance Use Topics  ? Alcohol use: Yes  ?  Alcohol/week: 13.0 - 14.0 standard drinks  ?  Types: 12 Cans of beer, 1 - 2 Shots of liquor per week  ?  Comment: 1-2 times per week.  ?  ? ?Family History:   ?The patient's family history includes Stroke in his father. ? ?ROS:  ?Please see the history of present illness.  ?All other ROS reviewed and negative.    ? ?Physical Exam/Data:  ? ?Vitals:  ? 11/08/21 0100 11/08/21 0115 11/08/21 0200 11/08/21 0449  ?BP: (!) 107/93 104/82  124/86  ?Pulse: 68 84  87  ?Resp: '15 14  20  '$ ?Temp:    97.6 ?F (36.4 ?C)  ?TempSrc:    Oral  ?SpO2: 94% 91%  94%  ?Weight:   108 kg   ?Height:   '6\' 3"'$  (1.905 m)   ? ?No intake or output data in the 24 hours ending 11/08/21 1142 ?Filed Weights  ? 11/07/21 1452 11/08/21 0200  ?Weight: 108.9 kg 108 kg  ? ?Body mass index is 29.76 kg/m?.  ? ?Gen: Patient appears comfortable at rest. ?HEENT: Conjunctiva and lids normal, oropharynx clear. ?Neck: Supple, no elevated JVP or carotid bruits, no thyromegaly. ?Lungs: Decreased breath sounds without wheezing, nonlabored breathing at rest. ?Cardiac: Irregularly irregular, no S3 or significant systolic murmur, no pericardial rub. ?Abdomen: Soft, nontender, bowel sounds present. ?Extremities: Mild peripheral edema absent right femoral pulse and right DP. ?Skin: Small ulceration right great toe nailbed. ?Musculoskeletal: No kyphosis. ?Neuropsychiatric: Alert and oriented x3, affect grossly appropriate. ? ?EKG:  An ECG dated 11/07/2021 was personally reviewed today and demonstrated:  Atrial fibrillation with right bundle branch block and left anterior fascicular block. ? ?Telemetry:  I personally reviewed telemetry which shows atrial  fibrillation. ? ?Relevant CV Studies: ? ?No prior cardiac testing for review. ? ?Laboratory Data: ? ?Chemistry ?Recent Labs  ?Lab 11/07/21 ?1603 11/08/21 ?0400  ?NA 141 139  ?K 4.5 3.8  ?CL 106 105  ?CO2 27 26  ?GLUCOSE 91 102*  ?BUN 13 12  ?CREATININE 0.98 0.88  ?CALCIUM 9.3 9.1  ?GFRNONAA >60 >60  ?ANIONGAP 8 8  ?  ?Recent Labs  ?Lab 11/08/21 ?0400  ?PROT 6.2*  ?ALBUMIN 3.2*  ?AST 28  ?ALT 25  ?ALKPHOS 85  ?BILITOT 0.7  ? ?Hematology ?Recent Labs  ?Lab 11/07/21 ?1603 11/08/21 ?0400  ?WBC 6.1 6.6  ?RBC 4.49 4.40  ?HGB 14.8 14.2  ?HCT 43.3 41.6  ?MCV 96.4 94.5  ?MCH 33.0 32.3  ?MCHC 34.2 34.1  ?RDW 13.6 13.5  ?PLT 241 206  ? ? ?Radiology/Studies:  ?CT ANGIO AO+BIFEM W & OR WO CONTRAST ? ?Result Date: 11/07/2021 ?CLINICAL DATA:  Pt arrived POV from home c/o right foot pain/leg pain x1 month. Pt states he has poor circulation. Pt's right foot is blue and discolored. Unable to locate a pulse with  the doppler. EXAM: CT ANGIOGRAPHY OF ABDOMINAL AORTA WITH ILIOFEMORAL RUNOFF TECHNIQUE: Multidetector CT imaging of the abdomen, pelvis and lower extremities was performed using the standard protocol during bolus administration of intravenous contrast. Multiplanar CT image reconstructions and MIPs were obtained to evaluate the vascular anatomy. RADIATION DOSE REDUCTION: This exam was performed according to the departmental dose-optimization program which includes automated exposure control, adjustment of the mA and/or kV according to patient size and/or use of iterative reconstruction technique. CONTRAST:  157m OMNIPAQUE IOHEXOL 350 MG/ML SOLN COMPARISON:  None. FINDINGS: VASCULAR Aorta: Severe calcified and noncalcified atherosclerotic plaque of the infrarenal abdominal aorta. Normal caliber aorta without aneurysm, dissection, vasculitis or significant stenosis. Celiac: Patent without evidence of aneurysm, dissection, vasculitis or significant stenosis. SMA: Patent without evidence of aneurysm, dissection, vasculitis or  significant stenosis. Renals: Both renal arteries are patent without evidence of aneurysm, dissection, vasculitis, fibromuscular dysplasia or significant stenosis. IMA: Patent without evidence of aneurysm, dissection, vasculitis or signifi

## 2021-11-08 NOTE — Progress Notes (Signed)
ANTICOAGULATION CONSULT NOTE  ?Pharmacy Consult for Heparin ?Indication:  leg ischemia ? ?No Known Allergies ? ?Patient Measurements: ?Height: '6\' 3"'$  (190.5 cm) ?Weight: 108.9 kg (240 lb) ?IBW/kg (Calculated) : 84.5 ?Heparin Dosing Weight: 106.6 kg ? ?Vital Signs: ?Temp: 97.6 ?F (36.4 ?C) (04/22 0449) ?Temp Source: Oral (04/22 0449) ?BP: 124/86 (04/22 0449) ?Pulse Rate: 87 (04/22 0449) ? ?Labs: ?Recent Labs  ?  11/07/21 ?1503 11/07/21 ?1603 11/07/21 ?1603 11/07/21 ?2112 11/07/21 ?2311 11/08/21 ?0400  ?HGB  --  14.8  --   --   --  14.2  ?HCT  --  43.3  --   --   --  41.6  ?PLT  --  241  --   --   --  206  ?APTT 22*  --   --  >200*  --   --   ?LABPROT  --  13.5  --  15.4*  --   --   ?INR  --  1.0  --  1.2  --   --   ?HEPARINUNFRC  --  <0.10*   < > >1.10* 0.61 0.50  ?CREATININE  --  0.98  --   --   --  0.88  ? < > = values in this interval not displayed.  ? ? ?Estimated Creatinine Clearance: 117.6 mL/min (by C-G formula based on SCr of 0.88 mg/dL). ? ?Assessment: ?61 y.o. male with RLE ischemia on heparin.  Baseline heparin < 0.1, indicating possible noncompliance to PTA Xarelto.  Able to adjust heparin based on anti-Xa levels. ? ?Heparin level this morning therapeutic at 0.5 on 1800 units/hr. CBC stable and no s/x of bleeding per RN.  ? ?Goal of Therapy:  ?Heparin level 0.3-0.7 units/ml ?Monitor platelets by anticoagulation protocol: Yes ?  ?Plan:  ?Continue Heparin at 1800 units/hr ?Heparin level daily ?CBC daily while on heparin ? ?Cathrine Muster, PharmD ?PGY2 Cardiology Pharmacy Resident ?Phone: [phone] ? ?11/08/2021  8:13 AM ? ?Please check [email or web address] for unit-specific pharmacy phone numbers. ? ? ? ? ?

## 2021-11-08 NOTE — Progress Notes (Signed)
ANTICOAGULATION CONSULT NOTE  ?Pharmacy Consult for Heparin ?Indication:  leg ischemia ?Brief A/P: Heparin level within goal range Continue Heparin at current rate ? ? ?No Known Allergies ? ?Patient Measurements: ?Height: '6\' 3"'$  (190.5 cm) ?Weight: 108.9 kg (240 lb) ?IBW/kg (Calculated) : 84.5 ?Heparin Dosing Weight: 106.6 kg ? ?Vital Signs: ?Temp: 98.1 ?F (36.7 ?C) (04/21 1456) ?Temp Source: Oral (04/21 1456) ?BP: 101/71 (04/21 2215) ?Pulse Rate: 89 (04/21 2215) ? ?Labs: ?Recent Labs  ?  11/07/21 ?1503 11/07/21 ?1603 11/07/21 ?2112 11/07/21 ?2311  ?HGB  --  14.8  --   --   ?HCT  --  43.3  --   --   ?PLT  --  241  --   --   ?APTT 22*  --  >200*  --   ?LABPROT  --  13.5 15.4*  --   ?INR  --  1.0 1.2  --   ?HEPARINUNFRC  --  <0.10* >1.10* 0.61  ?CREATININE  --  0.98  --   --   ? ? ?Estimated Creatinine Clearance: 105.6 mL/min (by C-G formula based on SCr of 0.98 mg/dL). ? ?Assessment: ?61 y.o. male with RLE ischemia for heparin.  Baseline heparin < 0.1, indicating possible noncompliance.  Able to adjust heparin based on anti-Xa levels. ? ?Goal of Therapy:  ?Heparin level 0.3-0.7 units/ml ?Monitor platelets by anticoagulation protocol: Yes ?  ?Plan:  ?Continue Heparin at current rate  ?Follow-up am labs. ? ?Phillis Knack, PharmD, BCPS ? ? ? ? ?

## 2021-11-08 NOTE — Progress Notes (Signed)
Patient left leg is swollen and red ?Right leg is red and toes are blue and cool  ?

## 2021-11-08 NOTE — Progress Notes (Signed)
Patient to room 19 from the ED   alert and oriented x 4.  Denies any pain  no distress noted  VSS.  CHG bath done.   ?

## 2021-11-09 ENCOUNTER — Inpatient Hospital Stay (HOSPITAL_COMMUNITY): Payer: Medicaid Other

## 2021-11-09 DIAGNOSIS — Z01810 Encounter for preprocedural cardiovascular examination: Secondary | ICD-10-CM

## 2021-11-09 LAB — NM MYOCAR MULTI W/SPECT W/WALL MOTION / EF
Estimated workload: 1
Exercise duration (min): 5 min
Exercise duration (sec): 30 s
LV dias vol: 121 mL (ref 62–150)
LV sys vol: 49 mL
Nuc Stress EF: 60 %
Peak HR: 120 {beats}/min
Rest HR: 92 {beats}/min
Rest Nuclear Isotope Dose: 11 mCi
SDS: 0
SRS: 5
SSS: 5
ST Depression (mm): 0 mm
Stress Nuclear Isotope Dose: 33 mCi

## 2021-11-09 LAB — URINALYSIS, ROUTINE W REFLEX MICROSCOPIC
Bilirubin Urine: NEGATIVE
Glucose, UA: NEGATIVE mg/dL
Hgb urine dipstick: NEGATIVE
Ketones, ur: NEGATIVE mg/dL
Leukocytes,Ua: NEGATIVE
Nitrite: NEGATIVE
Protein, ur: NEGATIVE mg/dL
Specific Gravity, Urine: 1.008 (ref 1.005–1.030)
pH: 8 (ref 5.0–8.0)

## 2021-11-09 LAB — ECHOCARDIOGRAM COMPLETE
Calc EF: 64.4 %
Height: 75 in
S' Lateral: 3.7 cm
Single Plane A2C EF: 63.3 %
Single Plane A4C EF: 64.5 %
Weight: 3809.55 oz

## 2021-11-09 LAB — CBC
HCT: 39 % (ref 39.0–52.0)
Hemoglobin: 12.8 g/dL — ABNORMAL LOW (ref 13.0–17.0)
MCH: 32.1 pg (ref 26.0–34.0)
MCHC: 32.8 g/dL (ref 30.0–36.0)
MCV: 97.7 fL (ref 80.0–100.0)
Platelets: 189 10*3/uL (ref 150–400)
RBC: 3.99 MIL/uL — ABNORMAL LOW (ref 4.22–5.81)
RDW: 13.3 % (ref 11.5–15.5)
WBC: 4.8 10*3/uL (ref 4.0–10.5)
nRBC: 0 % (ref 0.0–0.2)

## 2021-11-09 LAB — HEPARIN LEVEL (UNFRACTIONATED): Heparin Unfractionated: 0.42 IU/mL (ref 0.30–0.70)

## 2021-11-09 LAB — LIPID PANEL
Cholesterol: 106 mg/dL (ref 0–200)
HDL: 28 mg/dL — ABNORMAL LOW (ref >40)
LDL Cholesterol: 50 mg/dL (ref 0–99)
Total CHOL/HDL Ratio: 3.8 RATIO
Triglycerides: 138 mg/dL (ref <150)
VLDL: 28 mg/dL (ref 0–40)

## 2021-11-09 MED ORDER — TECHNETIUM TC 99M TETROFOSMIN IV KIT
11.0000 | PACK | Freq: Once | INTRAVENOUS | Status: AC | PRN
  Administered 2021-11-09: 11 via INTRAVENOUS

## 2021-11-09 MED ORDER — NICOTINE 14 MG/24HR TD PT24
14.0000 mg | MEDICATED_PATCH | Freq: Every day | TRANSDERMAL | Status: DC
  Administered 2021-11-09 – 2021-11-12 (×4): 14 mg via TRANSDERMAL
  Filled 2021-11-09 (×5): qty 1

## 2021-11-09 MED ORDER — REGADENOSON 0.4 MG/5ML IV SOLN
INTRAVENOUS | Status: AC
Start: 2021-11-09 — End: 2021-11-09
  Filled 2021-11-09: qty 5

## 2021-11-09 MED ORDER — REGADENOSON 0.4 MG/5ML IV SOLN
0.4000 mg | Freq: Once | INTRAVENOUS | Status: AC
  Administered 2021-11-09: 0.4 mg via INTRAVENOUS
  Filled 2021-11-09: qty 5

## 2021-11-09 MED ORDER — ATORVASTATIN CALCIUM 40 MG PO TABS
40.0000 mg | ORAL_TABLET | Freq: Every day | ORAL | Status: DC
  Administered 2021-11-09 – 2021-11-12 (×4): 40 mg via ORAL
  Filled 2021-11-09 (×5): qty 1

## 2021-11-09 MED ORDER — TECHNETIUM TC 99M TETROFOSMIN IV KIT
33.0000 | PACK | Freq: Once | INTRAVENOUS | Status: AC | PRN
  Administered 2021-11-09: 33 via INTRAVENOUS

## 2021-11-09 MED ORDER — LOPERAMIDE HCL 2 MG PO CAPS: 2.0000 mg | ORAL_CAPSULE | ORAL | Status: DC | PRN

## 2021-11-09 NOTE — Progress Notes (Signed)
?  Echocardiogram ?2D Echocardiogram has been performed. ? ?Fidel Levy ?11/09/2021, 12:44 PM ?

## 2021-11-09 NOTE — Progress Notes (Signed)
PHARMACIST LIPID MONITORING ? ? ?[name] [name] is a 61 y.o. male admitted on 11/07/2021 with leg ischemia.  Pharmacy has been consulted to optimize lipid-lowering therapy with the indication of secondary prevention for clinical ASCVD. ? ?Recent Labs: ? ?Lipid Panel (last 6 months):   ?Lab Results  ?Component Value Date  ? CHOL 106 11/09/2021  ? TRIG 138 11/09/2021  ? HDL 28 (L) 11/09/2021  ? CHOLHDL 3.8 11/09/2021  ? VLDL 28 11/09/2021  ? [place] 50 11/09/2021  ? ? ?Hepatic function panel (last 6 months):   ?Lab Results  ?Component Value Date  ? AST 28 11/08/2021  ? ALT 25 11/08/2021  ? ALKPHOS 85 11/08/2021  ? BILITOT 0.7 11/08/2021  ? ? ?SCr (since admission):   ?Serum creatinine: 0.88 mg/dL 11/08/21 0400 ?Estimated creatinine clearance: 117.1 mL/min ? ?Current therapy and lipid therapy tolerance ?Current lipid-lowering therapy: atorvastatin 20 mg daily ?Previous lipid-lowering therapies (if applicable): NA ?Documented or reported allergies or intolerances to lipid-lowering therapies (if applicable): NA ? ?Assessment:   ?Patient is currently on a moderate intensity statin with atorvastatin 20 mg daily. LDL today returns at 50, however I discussed benefits of reversal of plaque build-up with LDL < 55 and patient is agreeable to try higher dose of atorvastatin at 40 mg daily.  ?Patient agrees with changes to lipid-lowering therapy ? ? ?Plan:   ? ?1.Statin intensity (high intensity recommended for all patients regardless of the LDL):  Add or increase statin to high intensity. ? ?2.Add ezetimibe (if any one of the following):   Not indicated at this time. ? ?3.Refer to lipid clinic:   No ? ?4.Follow-up with:  Primary care provider - [name], MD ? ?5.Follow-up labs after discharge:  Changes in lipid therapy were made. Check a lipid panel in 8-12 weeks then annually.    ? ?Cathrine Muster, PharmD ?PGY2 Cardiology Pharmacy Resident ?Phone: [phone] ?11/09/2021  7:15 AM ? ?Please check [email or web address] for  unit-specific pharmacy phone numbers. ? ?Aleene Davidson, PharmD ?11/09/2021, 7:08 AM  ?

## 2021-11-09 NOTE — Progress Notes (Signed)
? ?  Progress Note ? ?Patient Name: [name] [name] ?Date of Encounter: 11/09/2021 ? ?Primary Cardiologist: New to [facility] ? ?Please see full cardiology consultation note from yesterday.  Patient is undergoing echocardiogram and Lexiscan Myoview today for further cardiac risk assessment.  His perioperative cardiovascular risk is at least intermediate based on substrate with plan for vascular surgery under general anesthesia.  Further recommendations to follow.  Unless there are high risk features including high ischemic burden, would not necessarily anticipate a cardiac catheterization would be pursued.  Continue Toprol-XL and Lipitor.  Our service is continuing to follow. ? ?Signed, ?Rozann Lesches, MD  ?11/09/2021, 9:59 AM    ?

## 2021-11-09 NOTE — Progress Notes (Signed)
[name] [name] presented for a nuclear stress test today.  No immediate complications.  Stress imaging is pending at this time. ?  ?Preliminary EKG findings may be listed in the chart, but the stress test result will not be finalized until perfusion imaging is complete. ? ? ?Leanor Kail, [place]  ?11/09/2021 10:37 AM  ?

## 2021-11-09 NOTE — Progress Notes (Addendum)
?Progress Note ? ? ? ?11/09/2021 ?8:09 AM ?* No surgery found * ? ?Subjective:  says foot is about the same. Patients significant other at bedside ? ? ?Vitals:  ? 11/08/21 2305 11/09/21 0404  ?BP: 121/75 111/86  ?Pulse: 77 80  ?Resp: 18 20  ?Temp: 97.9 ?F (36.6 ?C) 98 ?F (36.7 ?C)  ?SpO2: 93% 95%  ? ?Physical Exam: ?Cardiac:  irregular ?Lungs:  non labored ?Extremities:  right foot warm. Toes cool. Hyperemic. Motor intact ?Abdomen:  soft, non distended ?Neurologic: alert and oriented ? ?CBC ?   ?Component Value Date/Time  ? WBC 4.8 11/09/2021 0512  ? RBC 3.99 (L) 11/09/2021 0512  ? HGB 12.8 (L) 11/09/2021 0512  ? HGB 15.9 08/27/2021 1459  ? HGB 18.7 (H) 10/03/2018 1450  ? HGB 17.8 (H) 05/21/2014 1228  ? HCT 39.0 11/09/2021 0512  ? HCT 52.2 (H) 10/03/2018 1450  ? HCT 50.9 (H) 05/21/2014 1228  ? PLT 189 11/09/2021 0512  ? PLT 155 08/27/2021 1459  ? PLT 230 10/03/2018 1450  ? MCV 97.7 11/09/2021 0512  ? MCV 89 10/03/2018 1450  ? MCV 90.9 05/21/2014 1228  ? MCH 32.1 11/09/2021 0512  ? MCHC 32.8 11/09/2021 0512  ? RDW 13.3 11/09/2021 0512  ? RDW 14.0 10/03/2018 1450  ? RDW 13.6 05/21/2014 1228  ? LYMPHSABS 1.0 09/22/2021 1740  ? LYMPHSABS 1.1 10/03/2018 1450  ? LYMPHSABS 1.8 05/21/2014 1228  ? MONOABS 0.8 09/22/2021 1740  ? MONOABS 0.6 05/21/2014 1228  ? EOSABS 0.2 09/22/2021 1740  ? EOSABS 0.1 10/03/2018 1450  ? EOSABS 0.2 10/15/2011 0224  ? EOSABS 0.1 03/22/2009 1236  ? BASOSABS 0.0 09/22/2021 1740  ? BASOSABS 0.0 10/03/2018 1450  ? BASOSABS 0.0 05/21/2014 1228  ? ? ?BMET ?   ?Component Value Date/Time  ? NA 139 11/08/2021 0400  ? NA 142 10/03/2018 1450  ? NA 141 05/21/2014 1226  ? K 3.8 11/08/2021 0400  ? K 4.3 05/21/2014 1226  ? CL 105 11/08/2021 0400  ? CL 108 (H) 12/28/2012 1405  ? CO2 26 11/08/2021 0400  ? CO2 28 05/21/2014 1226  ? GLUCOSE 102 (H) 11/08/2021 0400  ? GLUCOSE 101 05/21/2014 1226  ? GLUCOSE 95 12/28/2012 1405  ? BUN 12 11/08/2021 0400  ? BUN 15 10/03/2018 1450  ? BUN 13.2 05/21/2014 1226  ?  CREATININE 0.88 11/08/2021 0400  ? CREATININE 0.93 08/27/2021 1459  ? CREATININE 0.89 08/21/2019 1523  ? CREATININE 0.9 05/21/2014 1226  ? CALCIUM 9.1 11/08/2021 0400  ? CALCIUM 9.3 05/21/2014 1226  ? GFRNONAA >60 11/08/2021 0400  ? GFRNONAA >60 08/27/2021 1459  ? GFRNONAA >60 10/15/2011 0224  ? GFRAA >60 04/15/2020 1447  ? GFRAA >60 10/15/2011 0224  ? ? ?INR ?   ?Component Value Date/Time  ? INR 1.2 11/07/2021 2112  ? INR 2.40 02/04/2015 1345  ? ? ? ?Intake/Output Summary (Last 24 hours) at 11/09/2021 0809 ?Last data filed at 11/09/2021 0142 ?Gross per 24 hour  ?Intake 2046.57 ml  ?Output 1900 ml  ?Net 146.57 ml  ? ? ? ?Assessment/Plan:  61 y.o. 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 ? ?Right foot remains stable after heparin ?Pain overall controlled ?Appreciate cardiology assistance with pre operative risk assessment ?Plan is for ECHO and Myoview today ?NPO after midnight for Angiogram with Dr. Donzetta Matters 4/24 ? ?DVT prophylaxis:  Heparin IV ? ? ?Karoline Caldwell, PA-C ?[facility] ?[phone] ?11/09/2021 ?8:09 AM ? ?  VASCULAR STAFF ADDENDUM: ?I have independently interviewed and examined the patient. ?I agree with the above.  ?Workup ongoing. ?Thankfully his foot is stable. ?Appreciate cardiology help. ?Plan RLE angiogram with Dr. Donzetta Matters tomorrow. ? ?Yevonne Aline. [name], MD ?[facility] [place] ?Office Phone Number: [phone] ?11/09/2021 11:59 AM ? ? ?

## 2021-11-09 NOTE — Progress Notes (Signed)
? ?  Progress Note ? ?Patient Name: [name] [name] ?Date of Encounter: 11/09/2021 ? ?Primary Cardiologist: New to [facility] ? ?Echocardiogram and Lexiscan Myoview results noted.  No significant ischemic territories by Myoview, overall low risk findings and LVEF normal.  This argues against major obstructive CAD at baseline.  Echocardiogram also demonstrates normal LVEF at 60 to 65%, RV contraction and estimated RVSP also normal.  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 ? ?Based on this cardiac testing, he should be able to proceed with planned vascular surgery under general anesthesia at overall intermediate perioperative cardiovascular risk.  We will continue to follow with you. ? ?Signed, ?Rozann Lesches, MD  ?11/09/2021, 12:50 PM    ?

## 2021-11-09 NOTE — Progress Notes (Signed)
ANTICOAGULATION CONSULT NOTE  ?Pharmacy Consult for Heparin ?Indication:  leg ischemia ? ?No Known Allergies ? ?Patient Measurements: ?Height: '6\' 3"'$  (190.5 cm) ?Weight: 108 kg (238 lb 1.6 oz) ?IBW/kg (Calculated) : 84.5 ?Heparin Dosing Weight: 106.6 kg ? ?Vital Signs: ?Temp: 98 ?F (36.7 ?C) (04/23 0404) ?Temp Source: Oral (04/23 0404) ?BP: 111/86 (04/23 0404) ?Pulse Rate: 80 (04/23 0404) ? ?Labs: ?Recent Labs  ?  11/07/21 ?1503 11/07/21 ?1603 11/07/21 ?1603 11/07/21 ?2112 11/07/21 ?2311 11/08/21 ?0400 11/09/21 ?0512  ?HGB  --  14.8   < >  --   --  14.2 12.8*  ?HCT  --  43.3  --   --   --  41.6 39.0  ?PLT  --  241  --   --   --  206 189  ?APTT 22*  --   --  >200*  --   --   --   ?LABPROT  --  13.5  --  15.4*  --   --   --   ?INR  --  1.0  --  1.2  --   --   --   ?HEPARINUNFRC  --  <0.10*   < > >1.10* 0.61 0.50 0.42  ?CREATININE  --  0.98  --   --   --  0.88  --   ? < > = values in this interval not displayed.  ? ? ?Estimated Creatinine Clearance: 117.1 mL/min (by C-G formula based on SCr of 0.88 mg/dL). ? ?Assessment: ?61 y.o. male with RLE ischemia on heparin.  Baseline heparin < 0.1, indicating possible noncompliance to PTA Xarelto.  Able to adjust heparin based on anti-Xa levels. ? ?Heparin level this morning therapeutic at 0.42 on 1800 units/hr. Hemoglobin dropped from 14.2 to 12.8, plts stable. Patient has some bleeding around IV site under bandage, but no other bleeding noted. ? ?Goal of Therapy:  ?Heparin level 0.3-0.7 units/ml ?Monitor platelets by anticoagulation protocol: Yes ?  ?Plan:  ?Continue Heparin at 1800 units/hr ?Heparin level daily ?CBC daily while on heparin ? ?Cathrine Muster, PharmD ?PGY2 Cardiology Pharmacy Resident ?Phone: [phone] ? ?11/09/2021  7:19 AM ? ?Please check [email or web address] for unit-specific pharmacy phone numbers. ? ? ? ? ?

## 2021-11-10 ENCOUNTER — Encounter (HOSPITAL_COMMUNITY): Payer: Self-pay | Admitting: Vascular Surgery

## 2021-11-10 ENCOUNTER — Encounter (HOSPITAL_COMMUNITY)
Admission: EM | Disposition: A | Discharge status: Home / Self Care | Payer: Self-pay | Source: Home / Self Care | Attending: Vascular Surgery

## 2021-11-10 ENCOUNTER — Inpatient Hospital Stay (HOSPITAL_COMMUNITY): Payer: Medicaid Other

## 2021-11-10 DIAGNOSIS — I70235 Atherosclerosis of native arteries of right leg with ulceration of other part of foot: Secondary | ICD-10-CM

## 2021-11-10 HISTORY — PX: ABDOMINAL AORTOGRAM W/LOWER EXTREMITY: CATH118223

## 2021-11-10 LAB — HEPARIN LEVEL (UNFRACTIONATED)
Heparin Unfractionated: 0.26 IU/mL — ABNORMAL LOW (ref 0.30–0.70)
Heparin Unfractionated: 0.24 IU/mL — ABNORMAL LOW (ref 0.30–0.70)

## 2021-11-10 LAB — CBC
HCT: 36.4 % — ABNORMAL LOW (ref 39.0–52.0)
Hemoglobin: 12.3 g/dL — ABNORMAL LOW (ref 13.0–17.0)
MCH: 32.5 pg (ref 26.0–34.0)
MCHC: 33.8 g/dL (ref 30.0–36.0)
MCV: 96.3 fL (ref 80.0–100.0)
Platelets: 166 10*3/uL (ref 150–400)
RBC: 3.78 MIL/uL — ABNORMAL LOW (ref 4.22–5.81)
RDW: 13.5 % (ref 11.5–15.5)
WBC: 5.1 10*3/uL (ref 4.0–10.5)
nRBC: 0 % (ref 0.0–0.2)

## 2021-11-10 LAB — POCT ACTIVATED CLOTTING TIME: Activated Clotting Time: 149 seconds

## 2021-11-10 SURGERY — ABDOMINAL AORTOGRAM W/LOWER EXTREMITY
Anesthesia: LOCAL | Laterality: Right

## 2021-11-10 MED ORDER — MIDAZOLAM HCL 2 MG/2ML IJ SOLN
INTRAMUSCULAR | Status: AC
  Filled 2021-11-10: qty 2

## 2021-11-10 MED ORDER — SODIUM CHLORIDE 0.9 % IV SOLN
INTRAVENOUS | Status: DC
Start: 2021-11-10 — End: 2021-11-13

## 2021-11-10 MED ORDER — LIDOCAINE HCL (PF) 1 % IJ SOLN
INTRAMUSCULAR | Status: AC
  Filled 2021-11-10: qty 30

## 2021-11-10 MED ORDER — IODIXANOL 320 MG/ML IV SOLN
INTRAVENOUS | Status: DC | PRN
  Administered 2021-11-10: 75 mL

## 2021-11-10 MED ORDER — ACETAMINOPHEN 325 MG PO TABS: 650.0000 mg | ORAL_TABLET | ORAL | Status: DC | PRN

## 2021-11-10 MED ORDER — SODIUM CHLORIDE 0.9% FLUSH: 3.0000 mL | INTRAVENOUS | Status: DC | PRN

## 2021-11-10 MED ORDER — HEPARIN (PORCINE) IN NACL 1000-0.9 UT/500ML-% IV SOLN
INTRAVENOUS | Status: DC | PRN
  Administered 2021-11-10 (×2): 500 mL

## 2021-11-10 MED ORDER — FENTANYL CITRATE (PF) 100 MCG/2ML IJ SOLN
INTRAMUSCULAR | Status: DC | PRN
  Administered 2021-11-10: 50 ug via INTRAVENOUS

## 2021-11-10 MED ORDER — FENTANYL CITRATE (PF) 100 MCG/2ML IJ SOLN
INTRAMUSCULAR | Status: AC
  Filled 2021-11-10: qty 2

## 2021-11-10 MED ORDER — HYDRALAZINE HCL 20 MG/ML IJ SOLN: 5.0000 mg | INTRAMUSCULAR | Status: DC | PRN

## 2021-11-10 MED ORDER — LIDOCAINE HCL (PF) 1 % IJ SOLN
INTRAMUSCULAR | Status: DC | PRN
  Administered 2021-11-10: 12 mL

## 2021-11-10 MED ORDER — ONDANSETRON HCL 4 MG/2ML IJ SOLN: 4.0000 mg | Freq: Four times a day (QID) | INTRAMUSCULAR | Status: DC | PRN

## 2021-11-10 MED ORDER — SODIUM CHLORIDE 0.9 % IV SOLN: INTRAVENOUS | Status: AC

## 2021-11-10 MED ORDER — OXYCODONE HCL 5 MG PO TABS
5.0000 mg | ORAL_TABLET | ORAL | Status: DC | PRN
  Administered 2021-11-12: 5 mg via ORAL
  Administered 2021-11-12: 10 mg via ORAL
  Filled 2021-11-10 (×2): qty 2

## 2021-11-10 MED ORDER — SODIUM CHLORIDE 0.9% FLUSH
3.0000 mL | Freq: Two times a day (BID) | INTRAVENOUS | Status: DC
  Administered 2021-11-10 – 2021-11-12 (×4): 3 mL via INTRAVENOUS

## 2021-11-10 MED ORDER — HEPARIN (PORCINE) IN NACL 1000-0.9 UT/500ML-% IV SOLN
INTRAVENOUS | Status: AC
  Filled 2021-11-10: qty 1000

## 2021-11-10 MED ORDER — SODIUM CHLORIDE 0.9 % IV SOLN: 250.0000 mL | INTRAVENOUS | Status: DC | PRN

## 2021-11-10 MED ORDER — ASPIRIN EC 81 MG PO TBEC
81.0000 mg | DELAYED_RELEASE_TABLET | Freq: Every day | ORAL | Status: DC
  Administered 2021-11-11 – 2021-11-12 (×2): 81 mg via ORAL
  Filled 2021-11-10 (×3): qty 1

## 2021-11-10 MED ORDER — MORPHINE SULFATE (PF) 2 MG/ML IV SOLN: 2.0000 mg | INTRAVENOUS | Status: DC | PRN

## 2021-11-10 MED ORDER — LABETALOL HCL 5 MG/ML IV SOLN: 10.0000 mg | INTRAVENOUS | Status: DC | PRN

## 2021-11-10 MED ORDER — MIDAZOLAM HCL 2 MG/2ML IJ SOLN
INTRAMUSCULAR | Status: DC | PRN
  Administered 2021-11-10: 1 mg via INTRAVENOUS

## 2021-11-10 SURGICAL SUPPLY — 9 items
CATH CROSS OVER TEMPO 5F (CATHETERS) ×1 IMPLANT
CATH OMNI FLUSH 5F 65CM (CATHETERS) ×1 IMPLANT
KIT MICROPUNCTURE NIT STIFF (SHEATH) ×1 IMPLANT
KIT PV (KITS) ×3 IMPLANT
SHEATH PINNACLE 5F 10CM (SHEATH) ×1 IMPLANT
SYR MEDRAD MARK V 150ML (SYRINGE) ×1 IMPLANT
TRANSDUCER W/STOPCOCK (MISCELLANEOUS) ×3 IMPLANT
TRAY PV CATH (CUSTOM PROCEDURE TRAY) ×3 IMPLANT
WIRE BENTSON .035X145CM (WIRE) ×1 IMPLANT

## 2021-11-10 NOTE — H&P (Signed)
See consult note 4/21 for full H&P details. ? ?[name] [name]. [name], MD ?[facility] [place] ?Office Phone Number: [phone] ?11/10/2021 10:02 AM ? ?

## 2021-11-10 NOTE — Progress Notes (Signed)
70f sheath was removed from patient's left femoral artery.  Manual pressure was held for 15 minutes with no complications.  Groin site looks good and tegaderm was applied to the site.  Post orders were given to the patient.  Post vitals: BP 125/88, HR: 80, SATS: 95. ?

## 2021-11-10 NOTE — Progress Notes (Signed)
ANTICOAGULATION CONSULT NOTE  ?Pharmacy Consult for Heparin ?Indication:  leg ischemia ? ?No Known Allergies ? ?Patient Measurements: ?Height: '6\' 3"'$  (190.5 cm) ?Weight: 108 kg (238 lb 1.6 oz) ?IBW/kg (Calculated) : 84.5 ?Heparin Dosing Weight: 106.6 kg ? ?Vital Signs: ?Temp: 97.3 ?F (36.3 ?C) (04/24 1933) ?Temp Source: Oral (04/24 1933) ?BP: 115/97 (04/24 1933) ?Pulse Rate: 96 (04/24 1933) ? ?Labs: ?Recent Labs  ?  11/08/21 ?0400 11/09/21 ?4008 11/10/21 ?0256 11/10/21 ?2106  ?HGB 14.2 12.8* 12.3*  --   ?HCT 41.6 39.0 36.4*  --   ?PLT 206 189 166  --   ?HEPARINUNFRC 0.50 0.42 0.26* 0.24*  ?CREATININE 0.88  --   --   --   ? ? ?Estimated Creatinine Clearance: 117.1 mL/min (by C-G formula based on SCr of 0.88 mg/dL). ? ?Assessment: ?61 y.o. male with RLE ischemia on heparin.  Baseline heparin < 0.1, indicating possible noncompliance to PTA Xarelto. ? ?S/p aortogram - patient being considered for an aortic based bypass. Pharmacy consulted to resume heparin drip immediately. ? ?PM update - heparin level remains low and slightly decreased at 0.24. CBC wnl. No bleeding or issues with infusion per discussion with RN. ? ?Goal of Therapy:  ?Heparin level 0.3-0.7 units/ml ?Monitor platelets by anticoagulation protocol: Yes ?  ?Plan:  ?Increase heparin to 2100 units/hr ?Check 6hr heparin level ?Monitor daily CBC, s/sx bleeding ? ? ?Arturo Morton, PharmD, BCPS ?Please check AMION for all [place] contact numbers ?Clinical Pharmacist ?11/10/2021 9:57 PM ? ?

## 2021-11-10 NOTE — TOC Initial Note (Signed)
Transition of Care (TOC) - Initial/Assessment Note  ? ? ?Patient Details  ?Name: [name] [name] ?MRN: [id number] ?Date of Birth: Feb 16, 1961 ? ?Transition of Care [facility]) CM/SW Contact:    ?Ninfa Meeker, RN ?Phone Number: ?11/10/2021, 1:53 PM ? ?Clinical Narrative:                 ? ?Transition of Care Screening Note: ? ?Transition of Care Department [facility]) has reviewed patient and no TOC needs have been identified at this time. We will continue to monitor patient advancement through Interdisciplinary progressions. If new patient transition needs arise, please place a consult.   ?  ?  ? ? ?Patient Goals and CMS Choice ?  ?  ?  ? ?Expected Discharge Plan and Services ?  ?  ?  ?  ?  ?                ?  ?  ?  ?  ?  ?  ?  ?  ?  ?  ? ?Prior Living Arrangements/Services ?  ?  ?  ?       ?  ?  ?  ?  ? ?Activities of Daily Living ?  ?  ? ?Permission Sought/Granted ?  ?  ?   ?   ?   ?   ? ?Emotional Assessment ?  ?  ?  ?  ?  ?  ? ?Admission diagnosis:  Critical limb ischemia of right lower extremity ([place]) [I70.221] ?Patient Active Problem List  ? Diagnosis Date Noted  ? Critical limb ischemia of right lower extremity ([place]) 11/07/2021  ? Insomnia 08/18/2021  ? Aortic atherosclerosis ([place]) 08/16/2021  ? Radionecrosis of left mandible 08/16/2021  ? PAD (peripheral artery disease) ([place]) 08/16/2021  ? Healthcare maintenance 08/16/2021  ? Tobacco use disorder 08/16/2021  ? Hypothyroidism (acquired) 11/18/2020  ? Medication monitoring encounter 10/29/2020  ? Chronic osteomyelitis of mandible 09/30/2020  ? Recurrent deep vein thrombosis (DVT) ([place]) 02/06/2019  ? Thoracic ascending aortic aneurysm ([place]) 02/06/2019  ? Port-A-Cath in place 01/18/2019  ? Mucositis due to chemotherapy 12/07/2018  ? Malignant neoplasm of tonsillar fossa ([place]) 10/17/2018  ? 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 Chronic anticoagulation 01/02/2013  ? Superficial thrombosis of lower extremity 05/02/2012  ? Benign essential HTN 01/04/2012  ?  Hyperlipidemia, mixed 01/04/2012  ? ?PCP:  [name], MD ?Pharmacy:   ?[id number] [place] [facility] [address]?Phone: [phone]: [phone]? ?[facility]?Phone: [phone]: [phone]? ?[id number] [place]?Phone: [phone]: [phone]? ?[facility] ?[address]?Phone: [phone]: [phone]? ? ? ? ?Social Determinants of Health (SDOH) Interventions ?  ? ?Readmission Risk Interventions ?   ? View : No data to display.  ?  ?  ?  ? ? ? ?

## 2021-11-10 NOTE — Op Note (Signed)
? ? ?  Patient name: [name] [name] MRN: [id number] DOB: [birth year] Sex: male ? ?11/10/2021 ?Pre-operative Diagnosis: Critical right lower extremity ischemia with toe ulceration and symptomatic right common iliac artery aneurysm ?post-operative diagnosis:  Same ?Surgeon:  Eda Paschal. Donzetta Matters, MD ?Procedure Performed: ?1.  Ultrasound-guided cannulation left common femoral artery ?2.  Aortogram ?3.  Selection of right hypogastric artery and right lower extremity angiogram ?4.  Moderate sedation with fentanyl and Versed for 20 minutes ? ? ?Indications: 61 year old male with toe ulceration with known occluded SFA and popliteal arteries found to have occluded external iliac artery and common femoral artery by CT reconstitution of the profunda.  He is medicated for aortogram with right lower extremity to evaluate for surgical options ? ?Findings: Aorta was patent although mildly ectatic distally.  Bilateral renal arteries are patent without any stenosis.  Left common iliac artery is patent as is the atraumatic artery hypogastric artery on the left is occluded.  Common femoral is ectatic without any flow-limiting stenosis.  The right side the common neck arteries initially patent gives off the hypogastric artery which is large the external neck artery is occluded with what appears to be thrombus there is reconstitution of the profunda on the right and then reconstitutes distally the posterior tibial artery in the mid to upper calf. ? ?Patient is being considered for aortic based bypass to the right groin possibly aortobifemoral bypass with need for bypass of the posterior tibial artery as well. ?  ?Procedure:  The patient was identified in the holding area and taken to room 8.  The patient was then placed supine on the table and prepped and draped in the usual sterile fashion.  A time out was called.  Ultrasound was used to evaluate the left common femoral artery.  This was noted to be somewhat ectatic.  There is no spasm percent  lidocaine cannulated micropuncture needle followed by wire and sheath.  Images saved the permanent record.  Bentson wires placed followed by 5 [place] sheath.  Omni catheter was placed to L1 aortogram performed.  We crossed the bifurcation with a crossover catheter placed this in the hypogastric artery confirmed intraluminal access.  Omni catheter was placed right lower extremity angiography was performed.  With the above findings catheter was removed over wire.  Sheath will be pulled in postoperative holding.  He tolerated procedure without immediate complication ? ? ?Contrast: 75cc ? ?Ayaat Jansma C. Donzetta Matters, MD ?[facility] ?Office: [phone] ?Pager: [phone] ? ? ?

## 2021-11-10 NOTE — Progress Notes (Addendum)
ANTICOAGULATION CONSULT NOTE  ?Pharmacy Consult for Heparin ?Indication:  leg ischemia ? ?No Known Allergies ? ?Patient Measurements: ?Height: '6\' 3"'$  (190.5 cm) ?Weight: 108 kg (238 lb 1.6 oz) ?IBW/kg (Calculated) : 84.5 ?Heparin Dosing Weight: 106.6 kg ? ?Vital Signs: ?Temp: 97.2 ?F (36.2 ?C) (04/24 0263) ?Temp Source: Oral (04/24 7858) ?BP: 116/81 (04/24 0804) ?Pulse Rate: 68 (04/24 0804) ? ?Labs: ?Recent Labs  ?  11/07/21 ?1503 11/07/21 ?1603 11/07/21 ?1603 11/07/21 ?2112 11/07/21 ?2311 11/08/21 ?0400 11/09/21 ?8502 11/10/21 ?0256  ?HGB  --  14.8   < >  --   --  14.2 12.8* 12.3*  ?HCT  --  43.3   < >  --   --  41.6 39.0 36.4*  ?PLT  --  241   < >  --   --  206 189 166  ?APTT 22*  --   --  >200*  --   --   --   --   ?LABPROT  --  13.5  --  15.4*  --   --   --   --   ?INR  --  1.0  --  1.2  --   --   --   --   ?HEPARINUNFRC  --  <0.10*   < > >1.10*   < > 0.50 0.42 0.26*  ?CREATININE  --  0.98  --   --   --  0.88  --   --   ? < > = values in this interval not displayed.  ? ? ?Estimated Creatinine Clearance: 117.1 mL/min (by C-G formula based on SCr of 0.88 mg/dL). ? ?Assessment: ?61 y.o. male with RLE ischemia on heparin.  Baseline heparin < 0.1, indicating possible noncompliance to PTA Xarelto.  Able to adjust heparin based on anti-Xa levels. ? ?Heparin level this morning slightly sub-therapeutic at 0.26 on 1800 units/hr. Hemoglobin tending down 14.2>12.8>12.3, plts stable.  No bleeding noted. ? ?Goal of Therapy:  ?Heparin level 0.3-0.7 units/ml ?Monitor platelets by anticoagulation protocol: Yes ?  ?Plan:  ?Increase Heparin to 1950 units/hr ?Heparin level daily ?CBC daily while on heparin ? ? ?[employer], Pharm.D., BCPS ?Clinical Pharmacist ? ?**Pharmacist phone directory can be found on [email or web address] listed under [place]. ? ?11/10/2021 8:35 AM ? ?Addendum: ?s/p aortogram - patient being considered for an aortic based bypass. Pharmacy consulted to resume heparin drip immediately. ? ?Resume heparin drip at 1950  units/hr with no bolus - spoke with RN ?6h heparin level ? ?Renold Genta, PharmD, BCPS 12:40 PM ? ? ?

## 2021-11-10 NOTE — Progress Notes (Signed)
?  Progress Note ? ? ? ?11/10/2021 ?9:49 AM ?* No surgery date entered * ? ?Subjective: Leg feels about the same as yesterday ? ?Vitals:  ? 11/10/21 0325 11/10/21 0804  ?BP: 121/87 116/81  ?Pulse: 83 68  ?Resp: 19 16  ?Temp: 98.1 ?F (36.7 ?C) (!) 97.2 ?F (36.2 ?C)  ?SpO2: 97% 96%  ? ? ?Physical Exam: ?Awake alert and oriented ?Nonlabored respirations ?Right leg appears well perfused and sensation and motor intact ? ?CBC ?   ?Component Value Date/Time  ? WBC 5.1 11/10/2021 0256  ? RBC 3.78 (L) 11/10/2021 0256  ? HGB 12.3 (L) 11/10/2021 0256  ? HGB 15.9 08/27/2021 1459  ? HGB 18.7 (H) 10/03/2018 1450  ? HGB 17.8 (H) 05/21/2014 1228  ? HCT 36.4 (L) 11/10/2021 0256  ? HCT 52.2 (H) 10/03/2018 1450  ? HCT 50.9 (H) 05/21/2014 1228  ? PLT 166 11/10/2021 0256  ? PLT 155 08/27/2021 1459  ? PLT 230 10/03/2018 1450  ? MCV 96.3 11/10/2021 0256  ? MCV 89 10/03/2018 1450  ? MCV 90.9 05/21/2014 1228  ? MCH 32.5 11/10/2021 0256  ? MCHC 33.8 11/10/2021 0256  ? RDW 13.5 11/10/2021 0256  ? RDW 14.0 10/03/2018 1450  ? RDW 13.6 05/21/2014 1228  ? LYMPHSABS 1.0 09/22/2021 1740  ? LYMPHSABS 1.1 10/03/2018 1450  ? LYMPHSABS 1.8 05/21/2014 1228  ? MONOABS 0.8 09/22/2021 1740  ? MONOABS 0.6 05/21/2014 1228  ? EOSABS 0.2 09/22/2021 1740  ? EOSABS 0.1 10/03/2018 1450  ? EOSABS 0.2 10/15/2011 0224  ? EOSABS 0.1 03/22/2009 1236  ? BASOSABS 0.0 09/22/2021 1740  ? BASOSABS 0.0 10/03/2018 1450  ? BASOSABS 0.0 05/21/2014 1228  ? ? ?BMET ?   ?Component Value Date/Time  ? NA 139 11/08/2021 0400  ? NA 142 10/03/2018 1450  ? NA 141 05/21/2014 1226  ? K 3.8 11/08/2021 0400  ? K 4.3 05/21/2014 1226  ? CL 105 11/08/2021 0400  ? CL 108 (H) 12/28/2012 1405  ? CO2 26 11/08/2021 0400  ? CO2 28 05/21/2014 1226  ? GLUCOSE 102 (H) 11/08/2021 0400  ? GLUCOSE 101 05/21/2014 1226  ? GLUCOSE 95 12/28/2012 1405  ? BUN 12 11/08/2021 0400  ? BUN 15 10/03/2018 1450  ? BUN 13.2 05/21/2014 1226  ? CREATININE 0.88 11/08/2021 0400  ? CREATININE 0.93 08/27/2021 1459  ?  CREATININE 0.89 08/21/2019 1523  ? CREATININE 0.9 05/21/2014 1226  ? CALCIUM 9.1 11/08/2021 0400  ? CALCIUM 9.3 05/21/2014 1226  ? GFRNONAA >60 11/08/2021 0400  ? GFRNONAA >60 08/27/2021 1459  ? GFRNONAA >60 10/15/2011 0224  ? GFRAA >60 04/15/2020 1447  ? GFRAA >60 10/15/2011 0224  ? ? ?INR ?   ?Component Value Date/Time  ? INR 1.2 11/07/2021 2112  ? INR 2.40 02/04/2015 1345  ? ? ? ?Intake/Output Summary (Last 24 hours) at 11/10/2021 0949 ?Last data filed at 11/10/2021 0151 ?Gross per 24 hour  ?Intake 4050.54 ml  ?Output 950 ml  ?Net 3100.54 ml  ? ? ? ?Assessment/plan:  61 y.o. male is here with occluded right external iliac artery and common femoral artery with reconstitution of profunda femoris artery.  We are planning for angiography from the left common femoral approach today to better delineate his open surgical options. ? ? ?Maragret Vanacker C. Donzetta Matters, MD ?[facility] ?Office: [phone] ?Pager: [phone] ? ?11/10/2021 ?9:49 AM ? ?

## 2021-11-11 ENCOUNTER — Inpatient Hospital Stay (HOSPITAL_COMMUNITY): Payer: Medicaid Other

## 2021-11-11 DIAGNOSIS — R1032 Left lower quadrant pain: Secondary | ICD-10-CM | POA: Diagnosis not present

## 2021-11-11 LAB — HEPARIN LEVEL (UNFRACTIONATED)
Heparin Unfractionated: 0.49 IU/mL (ref 0.30–0.70)
Heparin Unfractionated: 0.33 IU/mL (ref 0.30–0.70)

## 2021-11-11 LAB — COMPREHENSIVE METABOLIC PANEL
ALT: 33 U/L (ref 0–44)
AST: 44 U/L — ABNORMAL HIGH (ref 15–41)
Albumin: 3.1 g/dL — ABNORMAL LOW (ref 3.5–5.0)
Alkaline Phosphatase: 71 U/L (ref 38–126)
Anion gap: 9 (ref 5–15)
BUN: 5 mg/dL — ABNORMAL LOW (ref 8–23)
CO2: 20 mmol/L — ABNORMAL LOW (ref 22–32)
Calcium: 8.6 mg/dL — ABNORMAL LOW (ref 8.9–10.3)
Chloride: 110 mmol/L (ref 98–111)
Creatinine, Ser: 1 mg/dL (ref 0.61–1.24)
GFR, Estimated: >60 mL/min (ref >60)
Glucose, Bld: 103 mg/dL — ABNORMAL HIGH (ref 70–99)
Potassium: 3.6 mmol/L (ref 3.5–5.1)
Sodium: 139 mmol/L (ref 135–145)
Total Bilirubin: 0.9 mg/dL (ref 0.3–1.2)
Total Protein: 6 g/dL — ABNORMAL LOW (ref 6.5–8.1)

## 2021-11-11 LAB — CBC
HCT: 38.2 % — ABNORMAL LOW (ref 39.0–52.0)
Hemoglobin: 13.1 g/dL (ref 13.0–17.0)
MCH: 32.3 pg (ref 26.0–34.0)
MCHC: 34.3 g/dL (ref 30.0–36.0)
MCV: 94.3 fL (ref 80.0–100.0)
Platelets: 178 10*3/uL (ref 150–400)
RBC: 4.05 MIL/uL — ABNORMAL LOW (ref 4.22–5.81)
RDW: 13.2 % (ref 11.5–15.5)
WBC: 7.7 10*3/uL (ref 4.0–10.5)
nRBC: 0 % (ref 0.0–0.2)

## 2021-11-11 LAB — LIPID PANEL
Cholesterol: 107 mg/dL (ref 0–200)
HDL: 37 mg/dL — ABNORMAL LOW (ref >40)
LDL Cholesterol: 52 mg/dL (ref 0–99)
Total CHOL/HDL Ratio: 2.9 RATIO
Triglycerides: 89 mg/dL (ref <150)
VLDL: 18 mg/dL (ref 0–40)

## 2021-11-11 LAB — LIPASE, BLOOD: Lipase: 31 U/L (ref 11–51)

## 2021-11-11 MED ORDER — IOHEXOL 9 MG/ML PO SOLN
ORAL | Status: AC
  Administered 2021-11-11: 500 mL via ORAL
  Filled 2021-11-11: qty 1000

## 2021-11-11 MED ORDER — BISACODYL 10 MG RE SUPP
10.0000 mg | Freq: Once | RECTAL | Status: AC
  Administered 2021-11-11: 10 mg via RECTAL
  Filled 2021-11-11: qty 1

## 2021-11-11 MED ORDER — SIMETHICONE 40 MG/0.6ML PO SUSP
120.0000 mg | Freq: Four times a day (QID) | ORAL | Status: DC | PRN
  Filled 2021-11-11: qty 1.8

## 2021-11-11 MED ORDER — IOHEXOL 350 MG/ML SOLN
100.0000 mL | Freq: Once | INTRAVENOUS | Status: AC | PRN
  Administered 2021-11-11: 100 mL via INTRAVENOUS

## 2021-11-11 MED ORDER — IOHEXOL 9 MG/ML PO SOLN
500.0000 mL | ORAL | Status: AC
  Administered 2021-11-11: 500 mL via ORAL

## 2021-11-11 MED ORDER — SIMETHICONE 40 MG/0.6ML PO SUSP
160.0000 mg | Freq: Four times a day (QID) | ORAL | Status: DC | PRN
  Administered 2021-11-11 (×2): 160 mg via ORAL
  Filled 2021-11-11 (×5): qty 2.4

## 2021-11-11 MED ORDER — SIMETHICONE 80 MG PO CHEW: 160.0000 mg | CHEWABLE_TABLET | Freq: Four times a day (QID) | ORAL | Status: DC | PRN

## 2021-11-11 NOTE — Progress Notes (Signed)
PHARMACIST LIPID MONITORING ? ? ?[name] [name] is a 61 y.o. male admitted on 11/07/2021 now Post angiogram with plan for aortobifemoral bypass with need for bypass of the posterior tibial artery as well for right LE critical limb ischemia.  Pharmacy has been consulted to optimize lipid-lowering therapy with the indication of secondary prevention for clinical ASCVD. ? ?Recent Labs: ? ?Lipid Panel (last 6 months):   ?Lab Results  ?Component Value Date  ? CHOL 107 11/11/2021  ? TRIG 89 11/11/2021  ? HDL 37 (L) 11/11/2021  ? CHOLHDL 2.9 11/11/2021  ? VLDL 18 11/11/2021  ? [place] 52 11/11/2021  ? ? ?Hepatic function panel (last 6 months):   ?Lab Results  ?Component Value Date  ? AST 28 11/08/2021  ? ALT 25 11/08/2021  ? ALKPHOS 85 11/08/2021  ? BILITOT 0.7 11/08/2021  ? ? ?SCr (since admission):   ?Serum creatinine: 0.88 mg/dL 11/08/21 0400 ?Estimated creatinine clearance: 117.1 mL/min ? ?Current therapy and lipid therapy tolerance ?Current lipid-lowering therapy: Lipitor 40 mg daily ?Previous lipid-lowering therapies (if applicable): Lipitor 20 mg daily ?Documented or reported allergies or intolerances to lipid-lowering therapies (if applicable): n/a ? ?Assessment:   ?No changes to lipid-lowering therapy currently, already on high intensity statin and LDL within goal ? ?Plan:   ? ?1.Statin intensity (high intensity recommended for all patients regardless of the LDL):  No statin changes. The patient is already on a high intensity statin. ? ?2.Add ezetimibe (if any one of the following):   Not indicated at this time. ? ?3.Refer to lipid clinic:   No ? ?4.Follow-up with:  Primary care provider - [name], MD ? ?5.Follow-up labs after discharge:  Changes in lipid therapy were made. Check a lipid panel in 8-12 weeks then annually.    ? ? ? ?Thank you for allowing [place] to participate in this patients care. ?Jens Som, PharmD ?11/11/2021 1:41 PM ? ?**Pharmacist phone directory can be found on [email or web address] listed under  [place]** ? ? ?

## 2021-11-11 NOTE — Progress Notes (Addendum)
?  ADDENDUM: ?Heparin level remains therapeutic this afternoon at 0.33 on heparin 2100 units/hr. F/u daily heparin level. ? ? ? ? ?ANTICOAGULATION CONSULT NOTE  ?Pharmacy Consult for Heparin ?Indication:  leg ischemia ? ?No Known Allergies ? ?Patient Measurements: ?Height: '6\' 3"'$  (190.5 cm) ?Weight: 108 kg (238 lb 1.6 oz) ?IBW/kg (Calculated) : 84.5 ?Heparin Dosing Weight: 106.6 kg ? ?Vital Signs: ?Temp: 98.1 ?F (36.7 ?C) (04/25 0827) ?Temp Source: Oral (04/25 0827) ?BP: 132/100 (04/25 0827) ?Pulse Rate: 92 (04/25 0827) ? ?Labs: ?Recent Labs  ?  11/09/21 ?6948 11/10/21 ?5462 11/10/21 ?2106 11/11/21 ?7035 11/11/21 ?1259  ?HGB 12.8* 12.3*  --  13.1  --   ?HCT 39.0 36.4*  --  38.2*  --   ?PLT 189 166  --  178  --   ?HEPARINUNFRC 0.42 0.26* 0.24* 0.49 0.33  ? ?Estimated Creatinine Clearance: 117.1 mL/min (by C-G formula based on SCr of 0.88 mg/dL). ? ?Assessment: ?61 y.o. male with RLE ischemia on heparin.  Baseline heparin < 0.1, indicating possible noncompliance to PTA Xarelto. ? ?S/p aortogram on 4/24- now scheduled 4/26 for an aortobifemoral and right femoral-tibial artery bypass graft. Pharmacy consulted to resume heparin drip immediately. ?Heparin level is therapeutic this morning after rate increase overnight. CBC stable, no infusion issues noted. ? ? ?Goal of Therapy:  ?Heparin level 0.3-0.7 units/ml ?Monitor platelets by anticoagulation protocol: Yes ?  ?Plan:  ?Continue heparin 2100 units/hr ?Repeat 6 hr heparin level ?Monitor daily HL, CBC, s/sx bleeding ?F/u bypass procedure ? ? ?Thank you for allowing [place] to participate in this patients care. ?Jens Som, PharmD ?11/11/2021 10:02 AM ? ?**Pharmacist phone directory can be found on [email or web address] listed under [place]** ? ?

## 2021-11-11 NOTE — Progress Notes (Signed)
Mobility Specialist Progress Note ? ? 11/11/21 1530  ?Mobility  ?Activity Contraindicated/medical hold  ? ?Pt inappropriate for mobility specialist at this time given level of complexity, physical assist, and/or precautions as advised by RN of pt. Mobility specialist to hold today, will continue to follow for readiness. ? ?Holland Falling ?Mobility Specialist ?Phone Number [phone] ? ?

## 2021-11-11 NOTE — Progress Notes (Addendum)
Pt has had complaints of sever abdominal pain which is located on LLQ since come back from angiogram on day shift. He has been increasing abdominal distension with hypoactive bowel sound. His pain is 8-10/10.  ? ?Abdominal x-ray result on 11/10/21 is no evidence of bowel obstruction., but gas-filled loops of large and small bowel. He is unable to passing gas. He denies nausea and vomiting. He denies ambulation because of foot pain. Dilaudid is given PRN. ? ?Maalox has been given x 3 doses previously, but he could not tolerate this med anymore due to the taste of medication.  Dr. Virl Cagey is notified. We received  order for Simethicone 120 mg q 6 hr PRN. (Dose of med is recommend by clinical pharmacist). Pt prefers liquid form due to Hx of mandibular bone fracture, has limitation of jaw bone ROM, cannot chew or open his mouth. Pt stays mostly on liquid diet.  Diet is changed to puree diet per Pt requested.  ? ?Pt is encouraged more ambulation with his wife in hall way.  ?He refused at the beginning of night shift, but finally agreed to walk one time in front of his room. ? ?He is hemodynamically stable. We will monitor. ? ? ?Kennyth Lose, RN ?  ? ?

## 2021-11-11 NOTE — Consult Note (Addendum)
? ?                                                                          [facility] Consult: ?12:39 PM ?11/11/2021 ? LOS: 4 days  ? ? ?Referring Provider: Dr Donzetta Matters of Vasc surgery.  ?Primary Care Physician:  [name], MD ?Primary Gastroenterologist:  unassigned.    ?Oncologist: Dr. Lorenso Courier ?Followed by palliative care but not DNR ? ? ?Reason for Consultation: Abdominal pain and distention. ?  ?HPI: [name] [name] is a 61 y.o. male.  PMH A-fib.  Hypertension.  Peripheral vascular disease.  Long history of recurrent DVTs of upper and lower extremities..  Hypercoagulable state.  Chronic Xarelto.  A nonobstructive, fat-containing left inguinal hernia and umbilical hernia were incidental findings on CT 12/2018.   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  2021 had biopsies at base of tongue pathology suggestive of radionecrosis, no malignancy. ?Carries diagnosis of chronic left mandibular osteoradionecrosis. ? ?Day 5 admission after presenting with critical right lower extremity ischemia in setting of chronically thrombosed right popliteal artery aneurysm.  Set to undergo vascular surgery tomorrow.  However yesterday morning he developed acute onset pain in left lower abdomen along with abdominal distention.  Pain somewhat worse if he lays on his right side.  Over the weekend he had a large "blowout" of brown stool.  Yesterday morning he had small amount of soft balls of nonbloody stool.  Earlier today was able to pass some flatus which lessened his discomfort and distention. ?Pain management normally consists of oxycodone 1 in the morning 2 in the evening but with this abdominal pain has been receiving Dilaudid.  This takes the pain level down from 10/10 to about 4-5/10.  No nausea or vomiting.  At baseline, due to his history of tonsillar cancer and  radiation, he has difficulty opening his mouth due to jaw stiffness and tends to to follow a liquid diet with supplements of [place] complete.  Baseline bowel movements is a formed, brown stool every 2 to 3 days. ?Xarelto is on hold with IV heparin in the interim. ?Single view abdomen film yesterday shows gas-filled loops of large and small bowel, minimal stool burden but no evidence for obstruction. ?Original CT angio aorta and femoral arteries of 4/21 showed severe calcified and noncalcified plaque in the infrarenal abdominal aorta.  Unremarkable celiac, SMA, IMA and renal arteries. ?CTAP with contrast ordered.  Additionally gastric hepatobiliary, splenic, pancreatic imaging were unremarkable.  There was scattered colonic diverticulosis. ?Renal function and electrolytes not deranged. ?WBC 7.7.  Hb 13.1 with MCV 94.  Normal platelets ? ?Denies family history of ulcer disease, esophageal disease, colorectal cancer, peptic ulcer disease. ?Still smoking cigarettes.   ?  ? ?Past Medical History:  ?Diagnosis Date  ? 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 Right 1st seen 11/14; thrombosed 11/15  ? 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[place]) 05/25/2017  ? Right SFA 05/07/17 while on warfarin INR 2.9  ? 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[place]) 05/25/2017  ? Right SFA 05/07/17 while on warfarin INR 2.9  ? Atrial fibrillation ([place])   ? Benign essential HTN 01/04/2012  ? Chronic anticoagulation  01/02/2013  ? Dermatofibroma of forearm 01/02/2013  ? Left side  ? DVT, lower extremity, proximal ([place]) 05/24/2013  ? Right leg femoral & popliteal 05/24/13  ? Hyperlipidemia, mixed 01/04/2012  ? 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 Primary hypercoagulable state ([place]) 10/01/2014  ? 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 Sinus bradycardia, chronic 01/04/2012  ? Superficial thrombosis of lower extremity 05/02/2012  ? ? ?Past Surgical History:  ?Procedure Laterality Date  ?  ABDOMINAL AORTOGRAM W/LOWER EXTREMITY Right 11/10/2021  ? Procedure: ABDOMINAL AORTOGRAM W/LOWER EXTREMITY;  Surgeon: Waynetta Sandy, MD;  Location: [facility];  Service: Cardiovascular;  Laterality: Right;  ? DIRECT LARYNGOSCOPY Left 10/19/2018  ? Procedure: DIRECT LARYNGOSCOPY;  Surgeon: Leta Baptist, MD;  Location: [facility];  Service: ENT;  Laterality: Left;  ? IR IMAGING GUIDED PORT INSERTION  11/04/2018  ? IR REMOVAL TUN ACCESS W/ PORT W/O FL MOD SED  05/01/2019  ? TONSILLECTOMY Left 10/19/2018  ? Procedure: BIOPSY OF LEFT TONSIL;  Surgeon: Leta Baptist, MD;  Location: [facility];  Service: ENT;  Laterality: Left;  ? ? ?Prior to Admission medications   ?Medication Sig Start Date End Date Taking? Authorizing Provider  ?atorvastatin (LIPITOR) 20 MG tablet Take 1 tablet (20 mg total) by mouth daily. 08/18/21 08/18/22 Yes Christian, Rylee, MD  ?cephALEXin (KEFLEX) 500 MG capsule Take 500 mg by mouth 2 (two) times daily. 11/06/21  Yes [provider]  ?cyclobenzaprine (FLEXERIL) 10 MG tablet TAKE 1 TABLET BY MOUTH THREE TIMES A DAY AS NEEDED FOR MUSCLE SPASMS ?Patient taking differently: Take 10 mg by mouth 3 (three) times daily as needed for muscle spasms. 08/27/21  Yes Orson Slick, MD  ?gabapentin (NEURONTIN) 100 MG capsule Take 100 mg by mouth every 8 (eight) hours as needed (For pain). 11/10/19  Yes [provider]  ?levothyroxine (SYNTHROID) 25 MCG tablet Take 1 tablet (25 mcg total) by mouth daily before breakfast. Take on empty stomach w/ water, 1 hour before other meds or food. 08/18/21  Yes Christian, Rylee, MD  ?metoprolol succinate (TOPROL-XL) 25 MG 24 hr tablet TAKE 1 TABLET (25 MG TOTAL) BY MOUTH DAILY. ?Patient taking differently: Take 25 mg by mouth every evening. 08/07/21  Yes Christian, Rylee, MD  ?ondansetron (ZOFRAN-ODT) 4 MG disintegrating tablet Take 1 tablet (4 mg total) by mouth every 8 (eight) hours as needed for nausea or vomiting. 09/22/21   Yes Duffy Bruce, MD  ?oxyCODONE (OXY IR/ROXICODONE) 5 MG immediate release tablet Take 5 mg by mouth every 8 (eight) hours as needed for moderate pain. 10/17/19  Yes [provider]  ?rivaroxaban (XARELTO) 20 MG TABS tablet Take 1 tablet (20 mg total) by mouth daily with supper. 08/14/21  Yes Christian, Rylee, MD  ?senna-docusate (SENOKOT-S) 8.6-50 MG tablet Take 1 tablet by mouth daily. 08/18/21  Yes Christian, Rylee, MD  ?traZODone (DESYREL) 50 MG tablet Take 1 tablet (50 mg total) by mouth at bedtime. ?Patient taking differently: Take 50 mg by mouth at bedtime as needed for sleep. 08/18/21  Yes Christian, Rylee, MD  ?vitamin E 180 MG (400 UNITS) capsule Take 400 IU daily x 1 week, then 400 IU BID ?Patient taking differently: Take 400 Units by mouth daily. 09/08/19  Yes Eppie Gibson, MD  ?enoxaparin (LOVENOX) 120 MG/0.8ML injection Inject 0.72 mLs (108 mg total) into the skin every 12 (twelve) hours for 10 days. ?Patient not taking: Reported on 11/07/2021 11/07/21 11/17/21  Dorethea Clan,  DO  ?glycopyrrolate (ROBINUL) 2 MG tablet Take 1 tablet (2 mg total) by mouth 3 (three) times daily as needed (mouth secretions). ?Patient not taking: Reported on 11/07/2021 09/22/21   Duffy Bruce, MD  ?sodium fluoride (PREVIDENT 5000 PLUS) 1.1 % CREA dental cream Apply to tooth brush. Brush teeth for 2 minutes. Spit out excess. DO NOT rinse afterwards. Repeat nightly. ?Patient not taking: Reported on 11/07/2021 10/24/18   Lenn Cal, DDS  ? ? ?Scheduled Meds: ? aspirin EC  81 mg Oral Daily  ? atorvastatin  40 mg Oral Daily  ? cephALEXin  500 mg Oral Q12H  ? levothyroxine  25 mcg Oral Q0600  ? metoprolol succinate  25 mg Oral Daily  ? nicotine  14 mg Transdermal Daily  ? pantoprazole  40 mg Oral Daily  ? potassium chloride  20-40 mEq Oral Once  ? sodium chloride flush  3 mL Intravenous Q12H  ? ?Infusions: ? sodium chloride Stopped (11/10/21 1953)  ? sodium chloride 100 mL/hr at 11/10/21 2041  ? sodium chloride    ?  heparin 2,100 Units/hr (11/11/21 5638)  ? lactated ringers 0 mL/hr at 11/10/21 1900  ? ?PRN Meds: ?sodium chloride, acetaminophen **OR** acetaminophen, alum & mag hydroxide-simeth, guaiFENesin-dextrometh

## 2021-11-11 NOTE — Progress Notes (Addendum)
[facility] ? ?Subjective  - Abdominal pain, distended. ? ? ?Objective ?(!) 126/100 ?84 ?(!) 97.3 ?F (36.3 ?C) (Oral) ?20 ?96% ? ?Intake/Output Summary (Last 24 hours) at 11/11/2021 0743 ?Last data filed at 11/11/2021 0305 ?Gross per 24 hour  ?Intake 2482.08 ml  ?Output 1200 ml  ?Net 1282.08 ml  ? ? ? ?Abdominal x ray 11/10/21: ?FINDINGS: ?Gas-filled loops of large and small bowel without evidence ?obstruction. Minimal stool burden. Gas in the stomach. ?  ?IMPRESSION: ?No evidence of bowel obstruction. Gas-filled loops of large and ?small bowel. ? ?Hypo BS to auscultation, distended ? Lungs non labored breathing ?Left groin soft without hematoma ?No doppler flow right LE ? ? ? ?Assessment/Planning: ?POD # 1 s/p diagnostic angiogram ? ?Post angiogram plan for aortobifemoral bypass with need for bypass of the posterior tibial artery as well for right LE critical limb ischemia.    Final plan per DR. Serigne Kubicek. ?He continues to complain about his abdominal pain and is requiring IV pain medication.  He has been treated for both constipation and then required imodium, produced loose stools and now constipated again.  Will defer to DR. Keyan Folson for pre-op bowel prep.   ? ? I have consulted [facility] for assistance with abdominal issues. Surgery is on hold until these issues are resolved/stable.   ? ? ?Roxy Horseman ?11/11/2021 ?7:43 AM ?-- ? ?Laboratory ?Lab Results: ?Recent Labs  ?  11/10/21 ?0256 11/11/21 ?0608  ?WBC 5.1 7.7  ?HGB 12.3* 13.1  ?HCT 36.4* 38.2*  ?PLT 166 178  ? ?BMET ?No results for input(s): NA, K, CL, CO2, GLUCOSE, BUN, CREATININE, CALCIUM in the last 72 hours. ? ?COAG ?Lab Results  ?Component Value Date  ? INR 1.2 11/07/2021  ? INR 1.0 11/07/2021  ? INR 3.7 (H) 11/25/2020  ? PROTIME 28.8 (H) 02/04/2015  ? PROTIME 28.8 (H) 12/10/2014  ? PROTIME 36.0 (H) 08/24/2014  ? ?No results found for: PTT ? ? ?VASCULAR STAFF ADDENDUM: ?I have independently interviewed and examined the  patient. ?I agree with the above.  ?Patient developed sudden abdominal pain and distension overnight. ?KUB = gas filled loops ?I was initially concerned about Ogilvie's vs. Ileus. 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 Will allow to discharge home and do case as an outpatient. ? ?Yevonne Aline. [name], MD ?[facility] ?Office Phone Number: [phone] ?11/11/2021 6:29 PM ? ? ?

## 2021-11-12 ENCOUNTER — Inpatient Hospital Stay (HOSPITAL_COMMUNITY): Payer: Medicaid Other

## 2021-11-12 DIAGNOSIS — R140 Abdominal distension (gaseous): Secondary | ICD-10-CM

## 2021-11-12 DIAGNOSIS — R1032 Left lower quadrant pain: Secondary | ICD-10-CM | POA: Diagnosis not present

## 2021-11-12 LAB — CBC
HCT: 36.1 % — ABNORMAL LOW (ref 39.0–52.0)
Hemoglobin: 12.5 g/dL — ABNORMAL LOW (ref 13.0–17.0)
MCH: 32.4 pg (ref 26.0–34.0)
MCHC: 34.6 g/dL (ref 30.0–36.0)
MCV: 93.5 fL (ref 80.0–100.0)
Platelets: 170 10*3/uL (ref 150–400)
RBC: 3.86 MIL/uL — ABNORMAL LOW (ref 4.22–5.81)
RDW: 13.4 % (ref 11.5–15.5)
WBC: 13.8 10*3/uL — ABNORMAL HIGH (ref 4.0–10.5)
nRBC: 0 % (ref 0.0–0.2)

## 2021-11-12 LAB — TROPONIN I (HIGH SENSITIVITY)
Troponin I (High Sensitivity): 23 ng/L — ABNORMAL HIGH (ref <18)
Troponin I (High Sensitivity): 23 ng/L — ABNORMAL HIGH (ref <18)

## 2021-11-12 LAB — HEPARIN LEVEL (UNFRACTIONATED): Heparin Unfractionated: 0.26 IU/mL — ABNORMAL LOW (ref 0.30–0.70)

## 2021-11-12 MED ORDER — PIPERACILLIN-TAZOBACTAM 3.375 G IVPB
3.3750 g | Freq: Three times a day (TID) | INTRAVENOUS | Status: DC
  Administered 2021-11-12 – 2021-11-13 (×2): 3.375 g via INTRAVENOUS
  Filled 2021-11-12 (×2): qty 50

## 2021-11-12 MED ORDER — IOHEXOL 350 MG/ML SOLN
80.0000 mL | Freq: Once | INTRAVENOUS | Status: AC | PRN
  Administered 2021-11-12: 80 mL via INTRAVENOUS

## 2021-11-12 MED ORDER — POLYETHYLENE GLYCOL 3350 17 G PO PACK
17.0000 g | PACK | Freq: Every day | ORAL | Status: DC
  Filled 2021-11-12: qty 1

## 2021-11-12 MED ORDER — VANCOMYCIN HCL 1500 MG/300ML IV SOLN
1500.0000 mg | Freq: Two times a day (BID) | INTRAVENOUS | Status: DC
  Administered 2021-11-13: 1500 mg via INTRAVENOUS
  Filled 2021-11-12: qty 300

## 2021-11-12 MED ORDER — ACETAMINOPHEN 160 MG/5ML PO SOLN
325.0000 mg | Freq: Four times a day (QID) | ORAL | Status: DC | PRN
  Administered 2021-11-12: 650 mg via ORAL
  Filled 2021-11-12: qty 20.3

## 2021-11-12 MED ORDER — VANCOMYCIN HCL 2000 MG/400ML IV SOLN
2000.0000 mg | Freq: Once | INTRAVENOUS | Status: AC
  Administered 2021-11-12: 2000 mg via INTRAVENOUS
  Filled 2021-11-12: qty 400

## 2021-11-12 NOTE — Progress Notes (Signed)
[facility] ?PROGRESS NOTE ? ?ASSESSMENT / PLAN: ?[name] [name] is a 61 y.o. male with subacute limb threatening ischemia of the right lower extremity. Will need extensive reconstruction to restore in-line flow to the foot, which he needs given small focus of ulceration on the right great toe. ? ?His left sided abdominal pain is likely explained by renal infarct demonstrated on CT scan. I think this is likely embolic in nature from his heart. He has been heparinized throughout his hospitalization. ? ?Today he is complaining of left sided chest discomfort. His heart rate is elevated (110-120 on my evaluation, irregular). He has mild hypoxemia (92% on RA, 96% on [place]). I initiated a workup looking for cause.  ? ?His revascularization is rescheduled for next week.  ? ?SUBJECTIVE: ?Reviewed CT scan with patient. New left chest pain this morning. ? ?OBJECTIVE: ?BP (!) 130/95 (BP Location: Left Arm)   Pulse 99   Temp 98.1 ?F (36.7 ?C) (Oral)   Resp 18   Ht '6\' 3"'$  (1.905 m)   Wt 108 kg   SpO2 90%   BMI 29.76 kg/m?  ? ?Intake/Output Summary (Last 24 hours) at 11/12/2021 1138 ?Last data filed at 11/12/2021 0825 ?Gross per 24 hour  ?Intake 2486.96 ml  ?Output 925 ml  ?Net 1561.96 ml  ?  ?Constitutional: Chronically ill appearing. no acute distress. ?Cardiac: RRR. ?Pulmonary: unlabored but on 2L [place] ?Abdomen: soft, not tender ?Vascular: doppler flow in left PT ? ? ?  Latest Ref Rng & Units 11/12/2021  ?  1:59 AM 11/11/2021  ?  6:08 AM 11/10/2021  ?  2:56 AM  ?CBC  ?WBC 4.0 - 10.5 K/uL 13.8   7.7   5.1    ?Hemoglobin 13.0 - 17.0 g/dL 12.5   13.1   12.3    ?Hematocrit 39.0 - 52.0 % 36.1   38.2   36.4    ?Platelets 150 - 400 K/uL 170   178   166    ?  ? ? ?  Latest Ref Rng & Units 11/11/2021  ?  2:30 PM 11/08/2021  ?  4:00 AM 11/07/2021  ?  4:03 PM  ?CMP  ?Glucose 70 - 99 mg/dL 103   102   91    ?BUN 8 - 23 mg/dL '5   12   13    '$ ?Creatinine 0.61 - 1.24 mg/dL 1.00   0.88   0.98    ?Sodium 135 - 145  mmol/L 139   139   141    ?Potassium 3.5 - 5.1 mmol/L 3.6   3.8   4.5    ?Chloride 98 - 111 mmol/L 110   105   106    ?CO2 22 - 32 mmol/L '20   26   27    '$ ?Calcium 8.9 - 10.3 mg/dL 8.6   9.1   9.3    ?Total Protein 6.5 - 8.1 g/dL 6.0   6.2     ?Total Bilirubin 0.3 - 1.2 mg/dL 0.9   0.7     ?Alkaline Phos 38 - 126 U/L 71   85     ?AST 15 - 41 U/L 44   28     ?ALT 0 - 44 U/L 33   25     ? ? ?Estimated Creatinine Clearance: 103 mL/min (by C-G formula based on SCr of 1 mg/dL). ? ?Yevonne Aline. [name], MD ?[facility] ?Office Phone Number: [phone] ?11/12/2021 11:38 AM ? ? ? ?

## 2021-11-12 NOTE — Progress Notes (Signed)
ANTICOAGULATION CONSULT NOTE  ? ?Pharmacy Consult for Heparin ?Indication:  leg ischemia ? ?No Known Allergies ? ?Patient Measurements: ?Height: '6\' 3"'$  (190.5 cm) ?Weight: 108 kg (238 lb 1.6 oz) ?IBW/kg (Calculated) : 84.5 ?Heparin Dosing Weight: 106.6 kg ? ?Vital Signs: ?Temp: 98.4 ?F (36.9 ?C) (04/26 0805) ?Temp Source: Oral (04/26 0805) ?BP: 101/84 (04/26 0805) ?Pulse Rate: 103 (04/26 0805) ? ?Labs: ?Recent Labs  ?  11/10/21 ?0256 11/10/21 ?2106 11/11/21 ?0608 11/11/21 ?1259 11/11/21 ?1430 11/12/21 ?0159  ?HGB 12.3*  --  13.1  --   --  12.5*  ?HCT 36.4*  --  38.2*  --   --  36.1*  ?PLT 166  --  178  --   --  170  ?HEPARINUNFRC 0.26*   < > 0.49 0.33  --  0.26*  ?CREATININE  --   --   --   --  1.00  --   ? < > = values in this interval not displayed.  ? ? ?Estimated Creatinine Clearance: 103 mL/min (by C-G formula based on SCr of 1 mg/dL). ? ?Assessment: ?61 y.o. male with RLE ischemia on heparin.  Baseline heparin < 0.1, indicating possible noncompliance to PTA Xarelto. ? ?S/p aortogram on 4/24 with planned fem-tib bypass 4/26, now cancelled with renal infarct noted on CTAP. Heparin level slightly below goal this morning at 0.26, CBC stable ? ?Goal of Therapy:  ?Heparin level 0.3-0.7 units/ml ?Monitor platelets by anticoagulation protocol: Yes ?  ?Plan:  ?Increase heparin to 2250 units/h ?Recheck heparin level with daily labs ? ?Arrie Senate, PharmD, BCPS, BCCP ?Clinical Pharmacist ?[phone] ?Please check [facility] for all [place] numbers ?11/12/2021 ? ? ?

## 2021-11-12 NOTE — Progress Notes (Signed)
Mobility Specialist Progress Note ? ? 11/12/21 1700  ?Therapy Vitals  ?Temp 99.2 ?F (37.3 ?C)  ?Temp Source Oral  ?Pulse Rate (!) 120  ?Resp 20  ?BP (!) 133/93  ?Patient Position (if appropriate) Sitting  ?Oxygen Therapy  ?SpO2 97 %  ?Mobility  ?Activity Ambulated with assistance in hallway;Ambulated with assistance in room  ?Level of Assistance Contact guard assist, steadying assist  ?Assistive Device Front wheel walker  ?Distance Ambulated (ft) 62 ft  ?Activity Response Tolerated fair  ?$Mobility charge 1 Mobility  ? ? ?Received pt in bed c/o LLQ abdominal pain (5/10) and RLE pain(8/10) but agreeable to mobility. Session limited today d/t multiple bouts of Afib and Tachycardia w/ a HR ranging from 101 -152bpm, yet pt denied any related symptoms. Contact guard for safety but no physical assist required during session. Returned back to bed w/o fault, wife in room and all needs met. RN notified about HR and rhythm.   ? ?Holland Falling ?Mobility Specialist ?Phone Number [phone] ?

## 2021-11-12 NOTE — Progress Notes (Addendum)
Pt has complaints of bloating and abdominal pain on LLQ, pain scale 6-8/10. He is able to rest after Dilaudid given. Pt reports Simethicone and Dilaudid has worked well to help relieving the pain and bloating.  ? ?Pt reports having low to poor appetite today. He can only drink liquid and lives on full liquid diet and puree diet at home. He continues on IV fluid NSS at 100 ml/hr. We will switch to LR for electrolytes support. We will discuss for nutritional support with team at am.  ? ? He has limitation to open his mouth, poor dental hygiene with multiple caries. Cannot brush his teeth. He can only clean his mouth by using mouth wash solution. Pt is a difficult case for intubation.  ? ?He has normal urination, but no BM today. Last BM was on 11/10/21. Abdominal is distended. He is able to have minimal of flatus today.  ? ?Pt has fever tonight 100.5 F. BP remains stable, HR 90-102, NSR/ST on the monitor. Respiratory is normal. Lungs clear on auscultation in all areas.   ? ?Left groin is negative for bleeding or hematoma.  ?Good signals on PT and DP on left foot. ?Weak signals on PT and DP on right foot. ?Pt continues on Heparin gtt at 2100 units/hr.   ? ?Pt's family members have concern of  abdominal CT result and request to speak with MD.  ? ?We notified Dr.Cain. We redirected Pt's family member to discuss in depth of the result with Dr. [name] and GI team at am per Dr.Cain's recommendation. Family members agreed. His wife will stay at bedside tonight, wait to talk with Dr. Standley Dakins and GI doctor at am. ? ?We will continue monitor. ? ? ?Kennyth Lose, RN ?

## 2021-11-12 NOTE — Progress Notes (Addendum)
SPO2 88-90% on room air, RR 22-31 at rest. 2 LPM of O2 NCL is given. Lungs clear on auscultation with diminished breath sound at lung base bilaterally.  ? ?Pt is encouraged deep breathing exercise by using incentive spirometer. We are aware of atelectasis from CT result. Instructed Pt to do deep breathing exercise frequently. Pt has a complaint of left lower pleuritic chest pain when does deep breathing. ? ?CCMD called to alert [place] that Pt  had a short time of Afib with RVR at 140s.  Mostly EKG remains rate controlled chronic Afib HR 100-116. BP stable. It may associate with fever, abdominal pain or electrolyte imbalances. We will discuss with a morning round team for further evaluation due to poor oral intake.   ? ?Pt has more white clear mucous in his throat. He continues to use oral suction. Pt is encouraged to clean his mouth with aseptic mouthwash.  ? ?Temp 99.6-100.5 F tonight. MEW score is yellow and red. ? ?We will continue to monitor vital sings closely.  ? ?Katalyna Socarras. RN ?

## 2021-11-12 NOTE — Progress Notes (Signed)
Pharmacy Antibiotic Note ? ?PLUMMER MATICH is a 61 y.o. male admitted on 11/07/2021 with pneumonia.  Pharmacy has been consulted for Vancomycin dosing. ? ?Day # 5 of admission.  ?WBC up 13.8. New Tm 100.5.  ?SCr 0.88 >> 1.  ?Changing from Keflex to [place].  ? ?Plan: ?Vancomycin '2000mg'$  IV x1 then '1500mg'$  IV every 12 hours.  ?Estimated AUC 474  ?Adjusted Zosyn to 3.375g IV every 8 hours - 4 hr infusion.  ?Consider transition to Cefepime to prevent AKI.  ?Monitor cultures, clinical status, and renal function.  ?Check Vancomycin levels as appropriate.  ? ?Height: '6\' 3"'$  (190.5 cm) ?Weight: 108 kg (238 lb 1.6 oz) ?IBW/kg (Calculated) : 84.5 ? ?Temp (24hrs), Avg:99 ?F (37.2 ?C), Min:98.1 ?F (36.7 ?C), Max:100.5 ?F (38.1 ?C) ? ?Recent Labs  ?Lab 11/07/21 ?1603 11/08/21 ?0400 11/09/21 ?8889 11/10/21 ?0256 11/11/21 ?1694 11/11/21 ?1430 11/12/21 ?0159  ?WBC 6.1 6.6 4.8 5.1 7.7  --  13.8*  ?CREATININE 0.98 0.88  --   --   --  1.00  --   ?  ?Estimated Creatinine Clearance: 103 mL/min (by C-G formula based on SCr of 1 mg/dL).   ? ?No Known Allergies ? ?Antimicrobials this admission: ?Vanc 4/26 >> ?Zosyn 4/26>> ? ?Dose adjustments this admission: ? ? ?Microbiology results: ?pending ? ?Thank you for allowing pharmacy to be a part of this patient?s care. ? ?Sloan Leiter, PharmD ?Clinical Pharmacist ?Please refer to [facility] for [place] numbers ?11/12/2021 9:41 PM ? ?

## 2021-11-12 NOTE — Progress Notes (Addendum)
Daily Rounding Note  11/12/2021, 8:06 AM  LOS: 5 days   SUBJECTIVE:   Chief complaint:   L abd pain.   Pain better w meds.  No N/V.  Appetitie remains low.   OBJECTIVE:         Vital signs in last 24 hours:    Temp:  [98 F (36.7 C)-100.5 F (38.1 C)] 98.4 F (36.9 C) (04/26 0805) Pulse Rate:  [92-116] 103 (04/26 0805) Resp:  [16-29] 16 (04/26 0805) BP: (101-138)/(73-100) 101/84 (04/26 0805) SpO2:  [88 %-96 %] 90 % (04/26 0805) Last BM Date : 11/10/21 Filed Weights   11/07/21 1452 11/08/21 0200  Weight: 108.9 kg 108 kg   General: looks chronically ill   Heart: Irreg Chest: clear bil.  No dyspnea or cough Abdomen: soft, NT, ND.  BS active.  No CVA tenderness  Extremities: slight swelling and erythema along w areas of blanching in R foot Neuro/Psych:  pleasant,  oriented x 3, no tremors or gross weakness.   Intake/Output from previous day: 04/25 0701 - 04/26 0700 In: 2487 [P.O.:500; I.V.:1987] Out: 1525 [Urine:1525]  Intake/Output this shift: No intake/output data recorded.  Lab Results: Recent Labs    11/10/21 0256 11/11/21 0608 11/12/21 0159  WBC 5.1 7.7 13.8*  HGB 12.3* 13.1 12.5*  HCT 36.4* 38.2* 36.1*  PLT 166 178 170   BMET Recent Labs    11/11/21 1430  NA 139  K 3.6  CL 110  CO2 20*  GLUCOSE 103*  BUN 5*  CREATININE 1.00  CALCIUM 8.6*   LFT Recent Labs    11/11/21 1430  PROT 6.0*  ALBUMIN 3.1*  AST 44*  ALT 33  ALKPHOS 71  BILITOT 0.9    Studies/Results: DG Abd 1 View  Result Date: 11/10/2021 CLINICAL DATA:  Abdominal distension EXAM: ABDOMEN - 1 VIEW COMPARISON:  None FINDINGS: Gas-filled loops of large and small bowel without evidence obstruction. Minimal stool burden. Gas in the stomach. IMPRESSION: No evidence of bowel obstruction. Gas-filled loops of large and small bowel. Electronically Signed   By: Genevive Bi M.D.   On: 11/10/2021 15:38   CT ABDOMEN PELVIS W  CONTRAST  Result Date: 11/11/2021 CLINICAL DATA:  Left lower quadrant pain EXAM: CT ABDOMEN AND PELVIS WITH CONTRAST TECHNIQUE: Multidetector CT imaging of the abdomen and pelvis was performed using the standard protocol following bolus administration of intravenous contrast. RADIATION DOSE REDUCTION: This exam was performed according to the departmental dose-optimization program which includes automated exposure control, adjustment of the mA and/or kV according to patient size and/or use of iterative reconstruction technique. CONTRAST:  OMNIPAQUE IOHEXOL 350 MG/ML SOLN COMPARISON:  CTA abdomen and pelvis with runoff dated November 07, 2021 FINDINGS: Lower chest: Normal heart size. Trace pericardial effusion. Bibasilar atelectasis. Hepatobiliary: No suspicious liver lesions. Sludge in stones seen in the gallbladder. No gallbladder wall thickening. No biliary ductal dilation. Pancreas: Unremarkable. No pancreatic ductal dilatation or surrounding inflammatory changes. Spleen: Normal in size without focal abnormality. Adrenals/Urinary Tract: Bilateral adrenal glands are unremarkable. No hydronephrosis. Non opacification of a portion of the lower pole and midportion of the left kidney. Low-attenuation lesions of the right kidney, compatible with simple cysts, no follow-up imaging recommended for these lesions. Stomach/Bowel: Stomach is within normal limits. Appendix appears normal. No evidence of bowel wall thickening, distention, or inflammatory changes. 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 Complete occlusion of the right external iliac artery  and proximal superficial femoral artery. Reproductive: Mild prostatomegaly. Other: Small fat containing left inguinal hernia. Small fat containing umbilical hernia. No abdominopelvic ascites. Musculoskeletal: No acute or significant osseous findings. IMPRESSION: 1. Non opacification of a portion of the lower pole and midportion of  the left kidney, compatible with renal infarct, findings are new when compared with prior CTA. 2. Complete occlusion of the right external iliac artery and proximal superficial femoral artery, findings were seen on prior CTA. 3. 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 4. Aortic Atherosclerosis (ICD10-I70.0). Electronically Signed   By: Allegra Lai M.D.   On: 11/11/2021 16:44   PERIPHERAL VASCULAR CATHETERIZATION  Result Date: 11/10/2021 Images from the original result were not included. Patient name: [name] [name] MRN: [id number] DOB: [birth year] Sex: male 11/10/2021 Pre-operative Diagnosis: Critical right lower extremity ischemia with toe ulceration and symptomatic right common iliac artery aneurysm post-operative diagnosis:  Same Surgeon:  Luanna Salk. Randie Heinz, MD Procedure Performed: 1.  Ultrasound-guided cannulation left common femoral artery 2.  Aortogram 3.  Selection of right hypogastric artery and right lower extremity angiogram 4.  Moderate sedation with fentanyl and Versed for 20 minutes Indications: 62 year old male with toe ulceration with known occluded SFA and popliteal arteries found to have occluded external iliac artery and common femoral artery by CT reconstitution of the profunda.  He is medicated for aortogram with right lower extremity to evaluate for surgical options Findings: Aorta was patent although mildly ectatic distally.  Bilateral renal arteries are patent without any stenosis.  Left common iliac artery is patent as is the atraumatic artery hypogastric artery on the left is occluded.  Common femoral is ectatic without any flow-limiting stenosis.  The right side the common neck arteries initially patent gives off the hypogastric artery which is large the external neck artery is occluded with what appears to be thrombus there is reconstitution of the profunda on the right and then reconstitutes distally the posterior tibial artery in the mid to upper  calf. Patient is being considered for aortic based bypass to the right groin possibly aortobifemoral bypass with need for bypass of the posterior tibial artery as well.  Procedure:  The patient was identified in the holding area and taken to room 8.  The patient was then placed supine on the table and prepped and draped in the usual sterile fashion.  A time out was called.  Ultrasound was used to evaluate the left common femoral artery.  This was noted to be somewhat ectatic.  There is no spasm percent lidocaine cannulated micropuncture needle followed by wire and sheath.  Images saved the permanent record.  Bentson wires placed followed by 5 [place] sheath.  Omni catheter was placed to L1 aortogram performed.  We crossed the bifurcation with a crossover catheter placed this in the hypogastric artery confirmed intraluminal access.  Omni catheter was placed right lower extremity angiography was performed.  With the above findings catheter was removed over wire.  Sheath will be pulled in postoperative holding.  He tolerated procedure without immediate complication Contrast: 75cc Brandon C. Randie Heinz, MD [facility] Office: [phone] Pager: [phone]    Scheduled Meds:  aspirin EC  81 mg Oral Daily   atorvastatin  40 mg Oral Daily   cephALEXin  500 mg Oral Q12H   levothyroxine  25 mcg Oral Q0600   metoprolol succinate  25 mg Oral Daily   nicotine  14 mg Transdermal Daily   pantoprazole  40 mg Oral Daily   potassium chloride  20-40 mEq Oral Once   sodium chloride flush  3 mL Intravenous Q12H   Continuous Infusions:  sodium chloride Stopped (11/10/21 1953)   sodium chloride Stopped (11/12/21 0411)   sodium chloride     heparin 2,100 Units/hr (11/11/21 2200)   lactated ringers 100 mL/hr at 11/12/21 0411   PRN Meds:.sodium chloride, acetaminophen **OR** acetaminophen, alum & mag hydroxide-simeth, guaiFENesin-dextromethorphan, hydrALAZINE, HYDROmorphone (DILAUDID)  injection, labetalol, morphine injection, ondansetron, oxyCODONE, oxyCODONE-acetaminophen, phenol, simethicone, sodium chloride flush   ASSESMENT:     Acute L abd pain w sense of bloating in pt w PVD and critical R LE ischemia.  CTAP confirms R renal infarct.   Lipase, LFTs normal.      Hx tonsillar cancer, s/p chemoradiation.  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  Diet generally liquid to very soft.       Critical R LE ischemia.  Revascularization procedure plans on hold.  .      Chronic xarelto, on hold.  Interim IV Heparin in place.      PLAN     GI will sign off.  Would leave Protonix 40 mg po in place.     Jennye Moccasin  11/12/2021, 8:06 AM Phone [phone]     Attending Physician Note   I have taken an interval history, reviewed the chart and examined the patient. I performed more than 50% of this encounter in conjunction with the APP. I agree with the APP's note, impression and recommendations with my edits. My additional impressions and recommendations are as follows.   *Left renal infarct on CT causing left sided abdominal pain, bloating. Mgmt per primary service.  *Constipation. Start Miralax qd.  *GI signing off.    Claudette Head, MD [facility] See AMION, [facility], for our on call provider

## 2021-11-13 ENCOUNTER — Other Ambulatory Visit (HOSPITAL_COMMUNITY): Payer: Self-pay

## 2021-11-13 LAB — HEPARIN LEVEL (UNFRACTIONATED)
Heparin Unfractionated: 0.15 IU/mL — ABNORMAL LOW (ref 0.30–0.70)
Heparin Unfractionated: 0.2 IU/mL — ABNORMAL LOW (ref 0.30–0.70)

## 2021-11-13 LAB — CBC
HCT: 36.5 % — ABNORMAL LOW (ref 39.0–52.0)
Hemoglobin: 12.3 g/dL — ABNORMAL LOW (ref 13.0–17.0)
MCH: 31.7 pg (ref 26.0–34.0)
MCHC: 33.7 g/dL (ref 30.0–36.0)
MCV: 94.1 fL (ref 80.0–100.0)
Platelets: 159 10*3/uL (ref 150–400)
RBC: 3.88 MIL/uL — ABNORMAL LOW (ref 4.22–5.81)
RDW: 13.7 % (ref 11.5–15.5)
WBC: 13.7 10*3/uL — ABNORMAL HIGH (ref 4.0–10.5)
nRBC: 0 % (ref 0.0–0.2)

## 2021-11-13 LAB — MRSA NEXT GEN BY PCR, NASAL: MRSA by PCR Next Gen: NOT DETECTED

## 2021-11-13 MED ORDER — HEPARIN BOLUS VIA INFUSION
2000.0000 [IU] | Freq: Once | INTRAVENOUS | Status: AC
  Administered 2021-11-13: 2000 [IU] via INTRAVENOUS
  Filled 2021-11-13: qty 2000

## 2021-11-13 MED ORDER — ASPIRIN 81 MG PO TBEC: 81.0000 mg | DELAYED_RELEASE_TABLET | Freq: Every day | ORAL | 11 refills | Status: AC

## 2021-11-13 MED ORDER — OXYCODONE HCL 5 MG PO TABS
5.0000 mg | ORAL_TABLET | Freq: Four times a day (QID) | ORAL | 0 refills | Status: DC | PRN
  Filled 2021-11-13: qty 30, 8d supply, fill #0

## 2021-11-13 MED ORDER — ENOXAPARIN SODIUM 120 MG/0.8ML IJ SOSY
1.0000 mg/kg | PREFILLED_SYRINGE | Freq: Two times a day (BID) | INTRAMUSCULAR | 0 refills | Status: DC
  Filled 2021-11-13: qty 16, 10d supply, fill #0

## 2021-11-13 MED ORDER — ATORVASTATIN CALCIUM 40 MG PO TABS
40.0000 mg | ORAL_TABLET | Freq: Every day | ORAL | 3 refills | Status: DC
  Filled 2021-11-13: qty 30, 30d supply, fill #0

## 2021-11-13 MED ORDER — LEVOFLOXACIN 750 MG PO TABS
750.0000 mg | ORAL_TABLET | Freq: Every day | ORAL | 0 refills | Status: AC
  Filled 2021-11-13: qty 7, 7d supply, fill #0

## 2021-11-13 NOTE — Progress Notes (Signed)
Patient discharged with all belongings.  Telemetry discontinued, CCMD aware. ? ?All PIVs removed. ?

## 2021-11-13 NOTE — Progress Notes (Signed)
ANTICOAGULATION CONSULT NOTE  ? ?Pharmacy Consult for Heparin ?Indication:  leg ischemia ? ?No Known Allergies ? ?Patient Measurements: ?Height: '6\' 3"'$  (190.5 cm) ?Weight: 108 kg (238 lb 1.6 oz) ?IBW/kg (Calculated) : 84.5 ?Heparin Dosing Weight: 106.6 kg ? ?Vital Signs: ?Temp: 97.5 ?F (36.4 ?C) (04/27 0021) ?Temp Source: Oral (04/27 0021) ?BP: 110/82 (04/27 0021) ?Pulse Rate: 100 (04/27 0021) ? ?Labs: ?Recent Labs  ?  11/11/21 ?0608 11/11/21 ?1259 11/11/21 ?1430 11/12/21 ?0159 11/12/21 ?1004 11/12/21 ?2143 11/13/21 ?0140  ?HGB 13.1  --   --  12.5*  --   --  12.3*  ?HCT 38.2*  --   --  36.1*  --   --  36.5*  ?PLT 178  --   --  170  --   --  159  ?HEPARINUNFRC 0.49 0.33  --  0.26*  --   --  0.15*  ?CREATININE  --   --  1.00  --   --   --   --   ?TROPONINIHS  --   --   --   --  23* 23*  --   ? ? ?Estimated Creatinine Clearance: 103 mL/min (by C-G formula based on SCr of 1 mg/dL). ? ?Assessment: ?61 y.o. male with RLE ischemia on heparin.  Baseline heparin < 0.1, indicating possible noncompliance to PTA Xarelto. ? ?S/p aortogram on 4/24 with planned fem-tib bypass 4/26, now cancelled with renal infarct noted on CTAP.  ? ?Heparin level below goal: 0.15, cbc stable, no issues with infusion or s/sx of bleeding reported  ?  ?Goal of Therapy:  ?Heparin level 0.3-0.7 units/ml ?Monitor platelets by anticoagulation protocol: Yes ?  ?Plan:  ?Bolus heparin 2000 units IV x1 ?Increase heparin to 2600 units/h ?Recheck heparin level with daily labs ? ?Georga Bora, PharmD ?Clinical Pharmacist ?11/13/2021 4:04 AM ?Please check AMION for all [place] numbers ? ? ? ?

## 2021-11-13 NOTE — Progress Notes (Addendum)
[facility] ? ?Subjective  - CC left sided chest discomfort, maybe a little beter. ? ? ?Objective ?133/86 ?(!) 111 ?99 ?F (37.2 ?C) (Oral) ?20 ?93% ? ?Intake/Output Summary (Last 24 hours) at 11/13/2021 0733 ?Last data filed at 11/13/2021 [id number] ?Gross per 24 hour  ?Intake 3639.03 ml  ?Output 1725 ml  ?Net 1914.03 ml  ? ? ?Abdomin distended, softer to palpation from prior exam, NTTP ?Lungs non labored breathing with right side chest pain mild ?Left LE with doppler PT signal ?Heart RRR ? ?FINDINGS: ?Cardiovascular: No filling defects in the pulmonary arteries to ?suggest pulmonary emboli. Cardiomegaly. 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 This compares to 4.2 cm ?previously. Scattered aortic calcifications. ?  ?Mediastinum/Nodes: Mildly enlarged mediastinal lymph nodes. AP ?window lymph node has a short axis diameter of 14 mm. Borderline ?prevascular lymph nodes with index node having a short axis diameter ?of 9 mm. Subcarinal lymph node has a short axis diameter of 8 mm. ?Trachea and esophagus are unremarkable. Thyroid unremarkable. ?  ?Lungs/Pleura: Emphysema. Trace bilateral pleural effusions. Airspace ?disease in the lingula and left lower lobe concerning for pneumonia. ?Right basilar airspace disease could reflect atelectasis or ?pneumonia. Patchy ground-glass opacities in the right upper lobe ?also concerning for pneumonia. ?  ?Upper Abdomen: Imaging into the upper abdomen demonstrates no acute ?findings. ?  ?Musculoskeletal: Chest wall soft tissues are unremarkable. No acute ?bony abnormality. ?  ?Review of the MIP images confirms the above findings. ?  ?IMPRESSION: ?No evidence of pulmonary embolus. ?  ?Cardiomegaly. ?  ?Mildly enlarged mediastinal lymph nodes, favor reactive. ?  ?Areas of bilateral airspace disease, most confluent in the lingula ?and left lower lobe. Findings concerning for pneumonia. ?  ?Trace bilateral pleural effusions. ?   ? ?Assessment/Planning: ? ?Right LE ischemia s/p post angiogram.   ?Plan for Aortobifemoral bypass 11/19/21 with DR. Tien Spooner ?He has new findings  concerning for pneumonia on CTA.  The scan was negative for PE.  He was started on IV antibiotics and can go home on oral Pharmacy recommend levofloxacin '750mg'$  q24h unless cultures result with a specific organism prior to D/C ? ?Troponin 23 ? ? He has been on Heparin with history of arterial embolism 2018 and Afib. ?Recently CTAP confirms R renal infarct Cr WNL 11/11/21, urine Op excellent > 1700 ml last 24 hours.  He is managed on Xarelto and was bridged on Lovenox prior to admission.  Will work out Water engineer for this as well with Database administrator. ? ? Patient will possibly be discharged soon and return for surgery 11/19/21.  Final plan per DR. Lindzey Zent.    ? ?Roxy Horseman ?11/13/2021 ?7:33 AM ?-- ? ?Laboratory ?Lab Results: ?Recent Labs  ?  11/12/21 ?0159 11/13/21 ?0140  ?WBC 13.8* 13.7*  ?HGB 12.5* 12.3*  ?HCT 36.1* 36.5*  ?PLT 170 159  ? ?BMET ?Recent Labs  ?  11/11/21 ?1430  ?NA 139  ?K 3.6  ?CL 110  ?CO2 20*  ?GLUCOSE 103*  ?BUN 5*  ?CREATININE 1.00  ?CALCIUM 8.6*  ? ? ?COAG ?Lab Results  ?Component Value Date  ? INR 1.2 11/07/2021  ? INR 1.0 11/07/2021  ? INR 3.7 (H) 11/25/2020  ? PROTIME 28.8 (H) 02/04/2015  ? PROTIME 28.8 (H) 12/10/2014  ? PROTIME 36.0 (H) 08/24/2014  ? ?No results found for: PTT ? ? ?VASCULAR STAFF ADDENDUM: ?I have independently interviewed and examined the patient. ?I agree with the above.  ?Workup yesterday  shows pneumonia. ?Abx started last night. Transition to PO antibiotics today.  ?No evidence of kidney injury after renal infarct.  ?Troponin flat. Persistent Afib. Doubt ACS. ?OK for discharge. Plan vascular reconstruction next week if he is improved. ? ?Yevonne Aline. [name], MD ?[facility] ?Office Phone Number: [phone] ?11/13/2021 10:33 AM ? ? ?

## 2021-11-13 NOTE — Progress Notes (Signed)
Order received to discharge patient.  Telemetry monitor removed and CCMD notified.  PIV access x3 removed.  Discharge instructions, follow up, medications and instructions for their use discussed with patient. ?

## 2021-11-13 NOTE — Progress Notes (Signed)
Pharmacy Antibiotic Note ? ?[name] [name] is a 61 y.o. male admitted on 11/07/2021 with pneumonia. Pharmacy has been consulted for Vancomycin dosing and is on Zosyn. Pt likely to discharge today. Cultures still pending. ? ?Plan: ?-Vancomycin '1500mg'$  IV every 12 hours - est AUC 474  ?-Zosyn 3.375g IV EI q8h ?-If discharged today - recommend levofloxacin '750mg'$  q24h unless cultures result with a specific organism prior to D/C ? ? ?Height: '6\' 3"'$  (190.5 cm) ?Weight: 108 kg (238 lb 1.6 oz) ?IBW/kg (Calculated) : 84.5 ? ?Temp (24hrs), Avg:98.6 ?F (37 ?C), Min:97.5 ?F (36.4 ?C), Max:99.2 ?F (37.3 ?C) ? ?Recent Labs  ?Lab 11/07/21 ?1603 11/08/21 ?0400 11/09/21 ?2620 11/10/21 ?0256 11/11/21 ?3559 11/11/21 ?1430 11/12/21 ?0159 11/13/21 ?0140  ?WBC 6.1 6.6 4.8 5.1 7.7  --  13.8* 13.7*  ?CREATININE 0.98 0.88  --   --   --  1.00  --   --   ? ?  ?Estimated Creatinine Clearance: 103 mL/min (by C-G formula based on SCr of 1 mg/dL).   ? ?No Known Allergies ? ?Antimicrobials this admission: ?Vanc 4/26 >> ?Zosyn 4/26>> ? ?Dose adjustments this admission: ? ? ?Microbiology results: ?pending ? ?Thank you for allowing pharmacy to be a part of this patient?s care. ? ? ?Arrie Senate, PharmD, BCPS, BCCP ?Clinical Pharmacist ?[phone] ?Please check AMION for all [place] numbers ?11/13/2021 ? ? ?

## 2021-11-13 NOTE — Progress Notes (Signed)
Patient refused all morning meds except Dilaudid and Metoprolol.   ?

## 2021-11-13 NOTE — Progress Notes (Signed)
ANTICOAGULATION CONSULT NOTE  ? ?Pharmacy Consult for Heparin ?Indication:  leg ischemia ? ?No Known Allergies ? ?Patient Measurements: ?Height: '6\' 3"'$  (190.5 cm) ?Weight: 108 kg (238 lb 1.6 oz) ?IBW/kg (Calculated) : 84.5 ?Heparin Dosing Weight: 106.6 kg ? ?Vital Signs: ?Temp: 99.9 ?F (37.7 ?C) (04/27 1253) ?Temp Source: Oral (04/27 1253) ?BP: 136/89 (04/27 1253) ?Pulse Rate: 97 (04/27 1253) ? ?Labs: ?Recent Labs  ?  11/11/21 ?0608 11/11/21 ?1259 11/11/21 ?1430 11/12/21 ?0159 11/12/21 ?1004 11/12/21 ?2143 11/13/21 ?0140 11/13/21 ?1159  ?HGB 13.1  --   --  12.5*  --   --  12.3*  --   ?HCT 38.2*  --   --  36.1*  --   --  36.5*  --   ?PLT 178  --   --  170  --   --  159  --   ?HEPARINUNFRC 0.49   < >  --  0.26*  --   --  0.15* 0.20*  ?CREATININE  --   --  1.00  --   --   --   --   --   ?TROPONINIHS  --   --   --   --  23* 23*  --   --   ? < > = values in this interval not displayed.  ? ? ?Estimated Creatinine Clearance: 103 mL/min (by C-G formula based on SCr of 1 mg/dL). ? ?Assessment: ?61 y.o. male with RLE ischemia on heparin.  Baseline heparin < 0.1, indicating possible noncompliance to PTA Xarelto. S/p aortogram on 4/24 with planned fem-tib bypass 4/26, now cancelled with renal infarct noted on CTAP.  ? ?Heparin level remains below goal at 0.2. ?  ?Goal of Therapy:  ?Heparin level 0.3-0.7 units/ml ?Monitor platelets by anticoagulation protocol: Yes ?  ?Plan:  ?Increase heparin to 2750 units/h ?Daily heparin level and CBC ? ?Arrie Senate, PharmD, BCPS, BCCP ?Clinical Pharmacist ?[phone] ?Please check AMION for all [place] numbers ?11/13/2021 ? ? ? ? ?

## 2021-11-14 ENCOUNTER — Telehealth: Payer: Self-pay

## 2021-11-14 NOTE — Telephone Encounter (Signed)
Transition Care Management Follow-up Telephone Call ?Date of discharge and from where: 11/13/2021 from [facility] ?How have you been since you were released from the hospital? Patient stated that he is doing "okay". Patient did not want to answer any of the questions regarding ADL's or other SDOH needs.  ?Any questions or concerns? No ? ? ?

## 2021-11-16 NOTE — Discharge Summary (Signed)
[facility] Discharge Summary ? ? ?Patient ID:  ?[name] [name] ?MRN: [id number] ?DOB/AGE: [birth year] 61 y.o. ? ?Admit date: 11/07/2021 ?Discharge date: 11/13/21 ?Date of Surgery: 11/10/2021 ?Surgeon: Surgeon(s): ?Waynetta Sandy, MD ? ?Admission Diagnosis: ?Critical limb ischemia of right lower extremity ([place]) [I70.221] ? ?Discharge Diagnoses:  ?Critical limb ischemia of right lower extremity ([place]) [I70.221] ? ?Secondary Diagnoses: ?Past Medical History:  ?Diagnosis Date  ? 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 Right 1st seen 11/14; thrombosed 11/15  ? 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 Right SFA 05/07/17 while on warfarin INR 2.9  ? 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 Right SFA 05/07/17 while on warfarin INR 2.9  ? Atrial fibrillation ([place])   ? Benign essential HTN 01/04/2012  ? Chronic anticoagulation 01/02/2013  ? Dermatofibroma of forearm 01/02/2013  ? Left side  ? DVT, lower extremity, proximal ([place]) 05/24/2013  ? Right leg femoral & popliteal 05/24/13  ? Hyperlipidemia, mixed 01/04/2012  ? 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 Primary hypercoagulable state ([place]) 10/01/2014  ? 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 Sinus bradycardia, chronic 01/04/2012  ? Superficial thrombosis of lower extremity 05/02/2012  ? ? ?Procedure(s): ?ABDOMINAL AORTOGRAM W/LOWER EXTREMITY ? ?Discharged Condition: stable ? ?HPI: 61 y.o. male with acutely ischemic right lower extremity seen in consultation by Dr. [name] on 11/07/21.  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 He had a right common femoral ectasia on CT angiogram in 2020. His right common femoral artery is thrombosed on duplex today.  He was medically managed on Xarelto.  Plan wa for vascular intervention this would require a left to right femoral-femoral bypass with a right  femoral tibial bypass.  He was scheduled for angiogram to assit with surgical plan for intervention. ? ? ?Hospital Course:  ?[name] [name] is a 61 y.o. male is S/P  ?Procedure(s): ?ABDOMINAL AORTOGRAM W/LOWER EXTREMITY ? ?Findings: Aorta was patent although mildly ectatic distally.  Bilateral renal arteries are patent without any stenosis.  Left common iliac artery is patent as is the atraumatic artery hypogastric artery on the left is occluded.  Common femoral is ectatic without any flow-limiting stenosis.  The right side the common neck arteries initially patent gives off the hypogastric artery which is large the external neck artery is occluded with what appears to be thrombus there is reconstitution of the profunda on the right and then reconstitutes distally the posterior tibial artery in the mid to upper calf. ? ?Patient is being considered for aortic based bypass to the right groin possibly aortobifemoral bypass with need for bypass of the posterior tibial artery as well. ? ?Surgery was planned, but the patient had several new findings of Abdominal constipation alternated with diarrhea.  He then develops distention and was not able to pass flatus.  CTA revealed  renal infarct.  No change in Cr and urine OP was good.   ?GI consult was called.  No intervention indicated.  Constipation. Start Miralax qd.  No follow up needed.  ? He then developed left sided chest pain on 11/12/21.  His heart rate is elevated (110-120 on my evaluation, irregular). He has mild hypoxemia (92% on RA, 96% on [place] ? He has new findings  concerning for pneumonia on CTA.  The scan was negative for PE.  He was started on IV antibiotics and can go home  on oral Pharmacy recommend levofloxacin '750mg'$  q24h.   ? He was discharged home and surgery is re scheduled for 11/19/21.  He was discharge on Lovenox bridge for Afib. ? ?Consults:  ?Treatment Team:  ?Cherre Robins, MD ? ?Significant Diagnostic Studies: ?CBC ?Lab Results  ?Component Value  Date  ? WBC 13.7 (H) 11/13/2021  ? HGB 12.3 (L) 11/13/2021  ? HCT 36.5 (L) 11/13/2021  ? MCV 94.1 11/13/2021  ? PLT 159 11/13/2021  ? ? ?BMET ?   ?Component Value Date/Time  ? NA 139 11/11/2021 1430  ? NA 142 10/03/2018 1450  ? NA 141 05/21/2014 1226  ? K 3.6 11/11/2021 1430  ? K 4.3 05/21/2014 1226  ? CL 110 11/11/2021 1430  ? CL 108 (H) 12/28/2012 1405  ? CO2 20 (L) 11/11/2021 1430  ? CO2 28 05/21/2014 1226  ? GLUCOSE 103 (H) 11/11/2021 1430  ? GLUCOSE 101 05/21/2014 1226  ? GLUCOSE 95 12/28/2012 1405  ? BUN 5 (L) 11/11/2021 1430  ? BUN 15 10/03/2018 1450  ? BUN 13.2 05/21/2014 1226  ? CREATININE 1.00 11/11/2021 1430  ? CREATININE 0.93 08/27/2021 1459  ? CREATININE 0.89 08/21/2019 1523  ? CREATININE 0.9 05/21/2014 1226  ? CALCIUM 8.6 (L) 11/11/2021 1430  ? CALCIUM 9.3 05/21/2014 1226  ? GFRNONAA >60 11/11/2021 1430  ? GFRNONAA >60 08/27/2021 1459  ? GFRNONAA >60 10/15/2011 0224  ? GFRAA >60 04/15/2020 1447  ? GFRAA >60 10/15/2011 0224  ? ?COAG ?Lab Results  ?Component Value Date  ? INR 1.2 11/07/2021  ? INR 1.0 11/07/2021  ? INR 3.7 (H) 11/25/2020  ? PROTIME 28.8 (H) 02/04/2015  ? PROTIME 28.8 (H) 12/10/2014  ? PROTIME 36.0 (H) 08/24/2014  ? ? ? ?Disposition:  ?Discharge to :Home ?Discharge Instructions   ? ? Call MD for:  redness, tenderness, or signs of infection (pain, swelling, bleeding, redness, odor or green/yellow discharge around incision site)   Complete by: As directed ?  ? Call MD for:  severe or increased pain, loss or decreased feeling  in affected limb(s)   Complete by: As directed ?  ? Call MD for:  temperature >100.5   Complete by: As directed ?  ? Resume previous diet   Complete by: As directed ?  ? ?  ? ?Allergies as of 11/13/2021   ?No Known Allergies ?  ? ?  ?Medication List  ?  ? ?STOP taking these medications   ? ?cephALEXin 500 MG capsule ?Commonly known as: KEFLEX ?  ? ?  ? ?TAKE these medications   ? ?aspirin 81 MG EC tablet ?Take 1 tablet (81 mg total) by mouth daily. Swallow whole. ?   ?atorvastatin 20 MG tablet ?Commonly known as: Lipitor ?Take 1 tablet (20 mg total) by mouth daily. ?What changed: Another medication with the same name was added. Make sure you understand how and when to take each. ?  ?atorvastatin 40 MG tablet ?Commonly known as: LIPITOR ?Take 1 tablet (40 mg total) by mouth daily. ?What changed: You were already taking a medication with the same name, and this prescription was added. Make sure you understand how and when to take each. ?  ?cyclobenzaprine 10 MG tablet ?Commonly known as: FLEXERIL ?TAKE 1 TABLET BY MOUTH THREE TIMES A DAY AS NEEDED FOR MUSCLE SPASMS ?What changed:  ?how much to take ?how to take this ?when to take this ?reasons to take this ?additional instructions ?  ?enoxaparin 120 MG/0.8ML injection ?Commonly known as: Lovenox ?Inject 0.72  mLs (108 mg total) into the skin every 12 (twelve) hours for 10 days. ?  ?gabapentin 100 MG capsule ?Commonly known as: NEURONTIN ?Take 100 mg by mouth every 8 (eight) hours as needed (For pain). ?  ?glycopyrrolate 2 MG tablet ?Commonly known as: ROBINUL ?Take 1 tablet (2 mg total) by mouth 3 (three) times daily as needed (mouth secretions). ?  ?levofloxacin 750 MG tablet ?Commonly known as: Levaquin ?Take 1 tablet (750 mg total) by mouth daily for 7 days. ?  ?levothyroxine 25 MCG tablet ?Commonly known as: SYNTHROID ?Take 1 tablet (25 mcg total) by mouth daily before breakfast. Take on empty stomach w/ water, 1 hour before other meds or food. ?  ?metoprolol succinate 25 MG 24 hr tablet ?Commonly known as: TOPROL-XL ?TAKE 1 TABLET (25 MG TOTAL) BY MOUTH DAILY. ?What changed: when to take this ?  ?ondansetron 4 MG disintegrating tablet ?Commonly known as: ZOFRAN-ODT ?Take 1 tablet (4 mg total) by mouth every 8 (eight) hours as needed for nausea or vomiting. ?  ?oxyCODONE 5 MG immediate release tablet ?Commonly known as: Oxy IR/ROXICODONE ?Take 1 tablet (5 mg total) by mouth every 6 (six) hours as needed for moderate  pain. ?What changed: when to take this ?  ?rivaroxaban 20 MG Tabs tablet ?Commonly known as: XARELTO ?Take 1 tablet (20 mg total) by mouth daily with supper. ?  ?senna-docusate 8.6-50 MG tablet ?Commonly known as: Senokot-S

## 2021-11-17 ENCOUNTER — Other Ambulatory Visit: Payer: Self-pay | Admitting: Vascular Surgery

## 2021-11-17 ENCOUNTER — Telehealth: Payer: Self-pay

## 2021-11-17 MED ORDER — ENOXAPARIN SODIUM 120 MG/0.8ML IJ SOSY
1.0000 mg/kg | PREFILLED_SYRINGE | Freq: Two times a day (BID) | INTRAMUSCULAR | 0 refills | Status: DC
Start: 2021-11-17 — End: 2021-12-10

## 2021-11-17 NOTE — Telephone Encounter (Signed)
Pt. Has been r/s to 11/26/21. Spoke to [employer] with pre op instructions. No further questions/concerns at this time.  ?

## 2021-11-17 NOTE — Telephone Encounter (Signed)
Pt called to discuss r/s his surgery that is scheduled for 11/19/21. He states he is still sore and can't take deep breaths and feels unwell from the pneumonia. He is on day 4 of Levaquin. MD has been made aware and will await his response as to how to proceed.  ?

## 2021-11-18 ENCOUNTER — Encounter: Payer: Medicaid Other | Admitting: Internal Medicine

## 2021-11-19 ENCOUNTER — Other Ambulatory Visit: Payer: Self-pay

## 2021-11-19 DIAGNOSIS — I70221 Atherosclerosis of native arteries of extremities with rest pain, right leg: Secondary | ICD-10-CM

## 2021-11-24 NOTE — Progress Notes (Signed)
Surgical Instructions ? ? ? Your procedure is scheduled on Wednesday, May 10th. ? Report to [facility] Main Entrance "A" at 6:30 A.M., then check in with the Admitting office. ? Call this number if you have problems the morning of surgery: ? [phone] ? ? If you have any questions prior to your surgery date call [phone]: Open Monday-Friday 8am-4pm ? ? ? Remember: ? Do not eat or drink after midnight the night before your surgery ? ? Take these medicines the morning of surgery with A SIP OF WATER:  ? Atorvastatin (Lipitor) ? Enoxaparin (Lovenox) ? Levothyroxine (Synthroid) ? ? If needed: ? Ondansetron (Zofran) ? Oxycodone ? Senokot  ? ?Follow your surgeon's instructions on when to stop Aspirin.  If no instructions were given by your surgeon then you will need to call the office to get those instructions.   ? ?As of today, STOP taking any Aspirin (unless otherwise instructed by your surgeon) Aleve, Naproxen, Ibuprofen, Motrin, Advil, Goody's, BC's, all herbal medications, fish oil, and all vitamins. ? ?         DAY OF SURGERY: ?Do not wear jewelry  ?Do not wear lotions, powders, colognes, or deodorant. ?Men may shave face and neck. ?Do not bring valuables to the hospital. ? ?[name] is not responsible for any belongings or valuables. .  ? ?Do NOT Smoke (Tobacco/Vaping)  24 hours prior to your procedure ? ?If you use a CPAP at night, you may bring your mask for your overnight stay. ?  ?Contacts, glasses, hearing aids, dentures or partials may not be worn into surgery, please bring cases for these belongings ?  ?For patients admitted to the hospital, discharge time will be determined by your treatment team. ?  ?Patients discharged the day of surgery will not be allowed to drive home, and someone needs to stay with them for 24 hours. ? ? ?SURGICAL WAITING ROOM VISITATION ?Patients having surgery or a procedure in a hospital may have two support people. ?Children under the age of 43 must have an adult with  them who is not the patient. ?They may stay in the waiting area during the procedure and may switch out with other visitors. If the patient needs to stay at the hospital during part of their recovery, the visitor guidelines for inpatient rooms apply. ? ?Please refer to the [place] website for the visitor guidelines for Inpatients (after your surgery is over and you are in a regular room).  ? ? ?Special instructions:   ? ?Oral Hygiene is also important to reduce your risk of infection.  Remember - BRUSH YOUR TEETH THE MORNING OF SURGERY WITH YOUR REGULAR TOOTHPASTE ? ? ?[name]- Preparing For Surgery ? ?Before surgery, you can play an important role. Because skin is not sterile, your skin needs to be as free of germs as possible. You can reduce the number of germs on your skin by washing with CHG (chlorahexidine gluconate) Soap before surgery.  CHG is an antiseptic cleaner which kills germs and bonds with the skin to continue killing germs even after washing.   ? ? ?Please do not use if you have an allergy to CHG or antibacterial soaps. If your skin becomes reddened/irritated stop using the CHG.  ?Do not shave (including legs and underarms) for at least 48 hours prior to first CHG shower. It is OK to shave your face. ? ?Please follow these instructions carefully. ?  ? ? Shower the NIGHT BEFORE SURGERY and the MORNING OF SURGERY with  CHG Soap.  ? If you chose to wash your hair, wash your hair first as usual with your normal shampoo. After you shampoo, rinse your hair and body thoroughly to remove the shampoo.  Then [employer] and genitals (private parts) with your normal soap and rinse thoroughly to remove soap. ? ?After that Use CHG Soap as you would any other liquid soap. You can apply CHG directly to the skin and wash gently with a scrungie or a clean washcloth.  ? ?Apply the CHG Soap to your body ONLY FROM THE NECK DOWN.  Do not use on open wounds or open sores. Avoid contact with your eyes, ears, mouth and  genitals (private parts). Wash Face and genitals (private parts)  with your normal soap.  ? ?Wash thoroughly, paying special attention to the area where your surgery will be performed. ? ?Thoroughly rinse your body with warm water from the neck down. ? ?DO NOT shower/wash with your normal soap after using and rinsing off the CHG Soap. ? ?Pat yourself dry with a CLEAN TOWEL. ? ?Wear CLEAN PAJAMAS to bed the night before surgery ? ?Place CLEAN SHEETS on your bed the night before your surgery ? ?DO NOT SLEEP WITH PETS. ? ? ?Day of Surgery: ? ?Take a shower with CHG soap. ?Wear Clean/Comfortable clothing the morning of surgery ?Do not apply any deodorants/lotions.   ?Remember to brush your teeth WITH YOUR REGULAR TOOTHPASTE. ? ?  ?Please read over the following fact sheets that you were given.  ? ?

## 2021-11-25 ENCOUNTER — Encounter (HOSPITAL_COMMUNITY)
Admission: RE | Admit: 2021-11-25 | Discharge: 2021-11-25 | Disposition: A | Discharge status: Home / Self Care | Payer: Medicaid Other | Source: Ambulatory Visit | Attending: Vascular Surgery | Admitting: Vascular Surgery

## 2021-11-25 ENCOUNTER — Encounter (HOSPITAL_COMMUNITY): Payer: Self-pay

## 2021-11-25 ENCOUNTER — Other Ambulatory Visit: Payer: Self-pay

## 2021-11-25 ENCOUNTER — Encounter (HOSPITAL_COMMUNITY): Payer: Self-pay | Admitting: Vascular Surgery

## 2021-11-25 VITALS — BP 126/98 | HR 97 | Temp 97.7°F | Resp 17 | Ht 76.0 in | Wt 230.1 lb

## 2021-11-25 DIAGNOSIS — Z01812 Encounter for preprocedural laboratory examination: Secondary | ICD-10-CM | POA: Diagnosis not present

## 2021-11-25 DIAGNOSIS — Z8679 Personal history of other diseases of the circulatory system: Secondary | ICD-10-CM | POA: Insufficient documentation

## 2021-11-25 DIAGNOSIS — I70221 Atherosclerosis of native arteries of extremities with rest pain, right leg: Secondary | ICD-10-CM | POA: Insufficient documentation

## 2021-11-25 DIAGNOSIS — Z8521 Personal history of malignant neoplasm of larynx: Secondary | ICD-10-CM | POA: Insufficient documentation

## 2021-11-25 DIAGNOSIS — I739 Peripheral vascular disease, unspecified: Secondary | ICD-10-CM | POA: Diagnosis not present

## 2021-11-25 HISTORY — DX: Pneumonia, unspecified organism: J18.9

## 2021-11-25 HISTORY — DX: Hypothyroidism, unspecified: E03.9

## 2021-11-25 LAB — URINALYSIS, ROUTINE W REFLEX MICROSCOPIC
Bilirubin Urine: NEGATIVE
Glucose, UA: NEGATIVE mg/dL
Ketones, ur: NEGATIVE mg/dL
Leukocytes,Ua: NEGATIVE
Nitrite: NEGATIVE
Protein, ur: NEGATIVE mg/dL
Specific Gravity, Urine: 1.016 (ref 1.005–1.030)
pH: 7 (ref 5.0–8.0)

## 2021-11-25 LAB — COMPREHENSIVE METABOLIC PANEL
ALT: 18 U/L (ref 0–44)
AST: 20 U/L (ref 15–41)
Albumin: 3.4 g/dL — ABNORMAL LOW (ref 3.5–5.0)
Alkaline Phosphatase: 82 U/L (ref 38–126)
Anion gap: 11 (ref 5–15)
BUN: 9 mg/dL (ref 8–23)
CO2: 26 mmol/L (ref 22–32)
Calcium: 9.8 mg/dL (ref 8.9–10.3)
Chloride: 103 mmol/L (ref 98–111)
Creatinine, Ser: 1.09 mg/dL (ref 0.61–1.24)
GFR, Estimated: >60 mL/min (ref >60)
Glucose, Bld: 100 mg/dL — ABNORMAL HIGH (ref 70–99)
Potassium: 3.7 mmol/L (ref 3.5–5.1)
Sodium: 140 mmol/L (ref 135–145)
Total Bilirubin: 1.2 mg/dL (ref 0.3–1.2)
Total Protein: 7.1 g/dL (ref 6.5–8.1)

## 2021-11-25 NOTE — Anesthesia Preprocedure Evaluation (Addendum)
Anesthesia Evaluation  ?Patient identified by MRN, date of birth, ID band ?Patient awake ? ? ? ?Reviewed: ?Allergy & Precautions, NPO status , Patient's Chart, lab work & pertinent test results, reviewed documented beta blocker date and time  ? ?History of Anesthesia Complications ?Negative for: history of anesthetic complications ? ?Airway ?Mallampati: IV ? ?TM Distance: <3 FB ?Neck ROM: Limited ? ?Mouth opening: Limited Mouth Opening ? Dental ? ?(+) Dental Advisory Given, Missing, Poor Dentition, Loose, Chipped ?  ?Pulmonary ?pneumonia, Current Smoker and Patient abstained from smoking.,  ?  ? ?+ decreased breath sounds ? ? ? ? ? Cardiovascular ?hypertension, Pt. on medications and Pt. on home beta blockers ?(-) angina+ Peripheral Vascular Disease  ?(-) Past MI + dysrhythmias Atrial Fibrillation  ?Rhythm:Irregular  ??1. Left ventricular ejection fraction, by estimation, is 60 to 65%. Left  ?ventricular ejection fraction by 2D MOD biplane is 64.4 %. The left  ?ventricle has normal function. The left ventricle has no regional wall  ?motion abnormalities. There is mild left  ?ventricular hypertrophy. Left ventricular diastolic function could not be  ?evaluated.  ??2. Right ventricular systolic function is normal. The right ventricular  ?size is normal. There is normal pulmonary artery systolic pressure. The  ?estimated right ventricular systolic pressure is 88.5 mmHg.  ??3. Left atrial size was mildly dilated.  ??4. Right atrial size was moderately dilated.  ??5. The mitral valve is abnormal. Trivial mitral valve regurgitation.  ??6. The aortic valve is tricuspid. Aortic valve regurgitation is not  ?visualized.  ??7. Aortic dilatation noted. There is mild dilatation of the aortic root,  ?measuring 41 mm. There is moderate dilatation of the ascending aorta,  ?measuring 46 mm.  ??8. The inferior vena cava is dilated in size with >50% respiratory  ?variability, suggesting right atrial  pressure of 8 mmHg.  ?  ?Neuro/Psych ?negative neurological ROS ? negative psych ROS  ? GI/Hepatic ?  ?Endo/Other  ?Hypothyroidism  ? Renal/GU ?  ? ?  ?Musculoskeletal ?negative musculoskeletal ROS ?(+)  ? Abdominal ?  ?Peds ? Hematology ? ?(+) Blood dyscrasia, anemia , Lab Results ?     Component                Value               Date                 ?     WBC                      13.7 (H)            11/13/2021           ?     HGB                      12.3 (L)            11/13/2021           ?     HCT                      36.5 (L)            11/13/2021           ?     MCV                      94.1  11/13/2021           ?     PLT                      159                 11/13/2021           ?Last lovenox >36 hours ago ? ?PTT 30 ?Lab Results ?     Component                Value               Date                 ?     INR                      1.1                 11/26/2021           ?     INR                      1.2                 11/07/2021           ?     INR                      1.0                 11/07/2021           ?     PROTIME                  28.8 (H)            02/04/2015           ?     PROTIME                  28.8 (H)            12/10/2014           ?     PROTIME                  36.0 (H)            08/24/2014           ?   ?Anesthesia Other Findings ? ? Reproductive/Obstetrics ? ?  ? ? ? ? ? ? ? ? ? ? ? ? ? ?  ?  ? ? ? ? ? ? ?Anesthesia Physical ?Anesthesia Plan ? ?ASA: 3 ? ?Anesthesia Plan: General  ? ?Post-op Pain Management: Epidural*  ? ?Induction: Intravenous ? ?PONV Risk Score and Plan: 1 and Ondansetron and Dexamethasone ? ?Airway Management Planned: Oral ETT, Video Laryngoscope Planned, Awake Intubation Planned and Fiberoptic Intubation Planned ? ?Additional Equipment: Arterial line, CVP and Ultrasound Guidance Line Placement ? ?Intra-op Plan:  ? ?Post-operative Plan: Possible Post-op intubation/ventilation ? ?Informed Consent: I have reviewed the patients History and Physical,  chart, labs and discussed the procedure including the risks, benefits and alternatives for the proposed anesthesia with the patient or authorized representative who has indicated his/her understanding and acceptance.  ? ? ? ?Dental advisory given ? ?Plan Discussed with: CRNA ? ?Anesthesia Plan Comments: (PAT note by Jeneen Rinks  Burns, PA-C: ? ?Dr. [name] requests Epidural with PCA for post-operative pain control. Discussed this with anesthesiologists Dr. Fransisco Beau and Dr. Ermalene Postin. I have also spoken with Evonnie Pat, RN and she will coordinate with floor where patient will be admitted regarding just in time training for nursing staff. ? ?Hx of atrial fibrillation and PAF. Pt was seen in consultation by cardiology during recent admission (for critical RLE ischemia) for preop assessment pior to undergoing vacular surgery. Stress and echo ordered. Per Dr. Myles Gip note 11/09/21, "Echocardiogram and Lexiscan Myoview results noted.  No significant ischemic territories by Myoview, overall low risk findings and LVEF normal.  This argues against major obstructive CAD at baseline.  Echocardiogram also demonstrates normal LVEF at 60 to 65%, RV contraction and estimated RVSP also normal.  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 Based on this cardiac testing, he should be able to proceed with planned vascular surgery under general anesthesia at overall intermediate perioperative cardiovascular risk.  We will continue to follow with you." ? 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 Last seen by ENT at [facility] 02/03/21. Per note, "Patient was initially treated by Dr. Isidore Moos and Dr. Benjamine Mola [facility], [place]) with vitamin E, pentoxifylline, and hyperbaric oxygen therapy (completed July 2021). We have since discussed the option of continued close observation vs mandible debridement with  possible pec flap soft tissue coverage. Patient elected to proceed with surgery, but this was postponed after to patient developed new DVTs. Today, the patient states that he is still interested in surgery but is not entirely sure what surgery would entail. 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 All questions were answered and the patient is still interested in surgery. I will discuss the patient's case with Dr. Nicolette Bang, head and neck attending. We will also have to figure out how long he will be on Lovenox and if/when his anticoagulation could be held." Pt has not yet undergone surgery. ? ?Dr. Fransisco Beau evaluated patient PAT appointment due to potential for difficult intubation.  Given patient's extremely limited oral opening, Dr. Fransisco Beau advised that awake fiberoptic intubation may be necessary. ? ?Pt has significant oral secretions, uses suction at home to manage.  ? 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 Last dose 11/24/21 10pm. ? ?PT/INR and PTT DOS. ? ?CBC 11/13/21 reviewed, mild anemia with Hgb 12.3, mildly elevated WBC 13.7. ? ?CMP 11/25/21 reviewed, unremarkable.  ? ?EKG 11/12/21: Atrial fibrillation with rapid ventricular response. Rate 116. Left axis deviation. Non-specific intra-ventricular conduction delay. ST & T wave abnormality, consider anterior ischemia ? ?CTA Chest 11/12/21: ?IMPRESSION: ?No evidence of pulmonary embolus. ?? ?Cardiomegaly. ?? ?Mildly enlarged mediastinal lymph nodes, favor reactive. ?? ?Areas of bilateral airspace disease, most confluent in the lingula ?and left lower lobe. Findings concerning for pneumonia. ?? ?Trace bilateral pleural effusions. ?? ?CT Soft tissue neck 09/09/20: ?IMPRESSION: ?1. Progressive bone changes in the posterior mandibular body and ?angle on the left with a  nondisplaced fracture. Findings may reflect ?osteoradionecrosis or infectious osteomyelitis. No evidence of an ?abscess. ?2. No recurrent mucosal mass or cervical lymphadenopathy identified. ?3. Aortic Atheroscl

## 2021-11-25 NOTE — Progress Notes (Signed)
I sent a staff message to Dr [name] with the UA results.  ?

## 2021-11-25 NOTE — Progress Notes (Signed)
Anesthesia Chart Review: ? ?Dr. [name] requests Epidural with PCA for post-operative pain control. Discussed this with anesthesiologists Dr. Fransisco Beau and Dr. Ermalene Postin. I have also spoken with Evonnie Pat, RN and she will coordinate with floor where patient will be admitted regarding just in time training for nursing staff. ? ?Hx of atrial fibrillation and PAF. Pt was seen in consultation by cardiology during recent admission (for critical RLE ischemia) for preop assessment pior to undergoing vacular surgery. Stress and echo ordered. Per Dr. Myles Gip note 11/09/21, "Echocardiogram and Lexiscan Myoview results noted.  No significant ischemic territories by Myoview, overall low risk findings and LVEF normal.  This argues against major obstructive CAD at baseline.  Echocardiogram also demonstrates normal LVEF at 60 to 65%, RV contraction and estimated RVSP also normal.  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 Based on this cardiac testing, he should be able to proceed with planned vascular surgery under general anesthesia at overall intermediate perioperative cardiovascular risk.  We will continue to follow with you." ? 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 Last seen by ENT at [facility] 02/03/21. Per note, "Patient was initially treated by Dr. Isidore Moos and Dr. Benjamine Mola [facility], [place]) with vitamin E, pentoxifylline, and hyperbaric oxygen therapy (completed July 2021). We have since discussed the option of continued close observation vs mandible debridement with possible pec flap soft tissue coverage. Patient elected to proceed with surgery, but this was postponed after to patient developed new DVTs. Today, the patient states that he is still interested in surgery but is not entirely sure what surgery would entail. 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 All questions were answered and the patient is still interested in surgery. I will discuss the patient's case with Dr. Nicolette Bang, head and neck attending. We will also have to figure out how long he will be on Lovenox and if/when his anticoagulation could be held." Pt has not yet undergone surgery. ? ?Dr. Fransisco Beau evaluated patient PAT appointment due to potential for difficult intubation.  Given patient's extremely limited oral opening, Dr. Fransisco Beau advised that awake fiberoptic intubation may be necessary. ? ?Pt has significant oral secretions, uses suction at home to manage.  ? 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 Last dose 11/24/21 10pm. ? ?PT/INR and PTT DOS. ? ?CBC 11/13/21 reviewed, mild anemia with Hgb 12.3, mildly elevated WBC 13.7. ? ?CMP 11/25/21 reviewed, unremarkable.  ? ?EKG 11/12/21: Atrial fibrillation with rapid ventricular response. Rate 116. Left axis deviation. Non-specific intra-ventricular conduction delay. ST & T wave abnormality, consider anterior ischemia ? ?CTA Chest 11/12/21: ?IMPRESSION: ?No evidence of pulmonary embolus. ?  ?Cardiomegaly. ?  ?Mildly enlarged mediastinal lymph nodes, favor reactive. ?  ?Areas of bilateral airspace disease, most confluent in the lingula ?and left lower lobe. Findings concerning for pneumonia. ?  ?Trace bilateral pleural effusions. ?  ?CT Soft tissue neck 09/09/20: ?IMPRESSION: ?1. Progressive bone changes in the posterior mandibular body and ?angle on the left with a nondisplaced fracture. Findings may reflect ?osteoradionecrosis or infectious osteomyelitis. No evidence of an ?abscess. ?2. No recurrent mucosal mass or cervical lymphadenopathy identified. ?3. Aortic Atherosclerosis (ICD10-I70.0) and Emphysema (ICD10-J43.9). ? ?Nuclear stress 11/09/21: ?  Findings are  consistent with no  prior ischemia. The study is low risk. ?  No ST deviation was noted. ?  LV perfusion is abnormal. Defect 1: There is a medium defect with moderate reduction in uptake present in the apical to mid inferior location(s) that is fixed. There is normal wall motion in the defect area. Consistent with artifact caused by subdiaphragmatic activity. ?  Left ventricular function is normal. End diastolic cavity size is mildly enlarged. End systolic cavity size is normal. ?  Prior study available for comparison from 05/22/2004. No changes compared to prior study. LVEF 59%, diphragmatic attenuation artifact, no ischemia ?  ?Moderate size and intensity fixed apical and inferoapical perfusion defect (SDS 0), suggestive of artifact. LVEF 60% with normal wall motion. Tracer uptake was improved at stress compared to rest. No reversible ischemia. No changes compared to a prior study in 2005. This is a low risk study. ? ?TTE 11/09/21: ? 1. Left ventricular ejection fraction, by estimation, is 60 to 65%. Left  ?ventricular ejection fraction by 2D MOD biplane is 64.4 %. The left  ?ventricle has normal function. The left ventricle has no regional wall  ?motion abnormalities. There is mild left  ?ventricular hypertrophy. Left ventricular diastolic function could not be  ?evaluated.  ? 2. Right ventricular systolic function is normal. The right ventricular  ?size is normal. There is normal pulmonary artery systolic pressure. The  ?estimated right ventricular systolic pressure is 03.5 mmHg.  ? 3. Left atrial size was mildly dilated.  ? 4. Right atrial size was moderately dilated.  ? 5. The mitral valve is abnormal. Trivial mitral valve regurgitation.  ? 6. The aortic valve is tricuspid. Aortic valve regurgitation is not  ?visualized.  ? 7. Aortic dilatation noted. There is mild dilatation of the aortic root,  ?measuring 41 mm. There is moderate dilatation of the ascending aorta,  ?measuring 46 mm.  ? 8. The inferior vena cava is dilated in size  with >50% respiratory  ?variability, suggesting right atrial pressure of 8 mmHg.  ? ?Comparison(s): No prior Echocardiogram. ? ? ? ? ?Karoline Caldwell, PA-C ?[facility] Short Stay Center/Anesthesiology ?Phone [phone] ?11/25/2021 4:02 PM ? ?

## 2021-11-25 NOTE — Progress Notes (Signed)
PCP - Rylee Christian  ?Cardiologist - Bartle (saw Dr. Cyndia Bent over 2 years ago, hasn't been back since, per patient) ? ?Chest x-ray - n/a ?EKG - 11/12/21 ?ECHO - 11/09/21 ? ? ?Blood Thinner Instructions: do not continue taking Lovenox after Monday, May 15th ?Aspirin: follow your surgeon's instructions on when to stop ? ?Anesthesia review: yes, hx of tonsillar cancer, difficult airway ? ?Patient denies shortness of breath, fever, cough and chest pain at PAT appointment ? ? ?All instructions explained to the patient, with a verbal understanding of the material. Patient agrees to go over the instructions while at home for a better understanding. Patient also instructed to self quarantine after being tested for COVID-19. The opportunity to ask questions was provided. ? ?**Draw PTT & PTINR on day of surgery, per Jeneen Rinks. ? ?

## 2021-11-26 ENCOUNTER — Other Ambulatory Visit: Payer: Self-pay

## 2021-11-26 ENCOUNTER — Inpatient Hospital Stay (HOSPITAL_COMMUNITY): Payer: Medicaid Other | Admitting: Anesthesiology

## 2021-11-26 ENCOUNTER — Inpatient Hospital Stay (HOSPITAL_COMMUNITY): Payer: Medicaid Other

## 2021-11-26 ENCOUNTER — Encounter (HOSPITAL_COMMUNITY): Payer: Self-pay | Admitting: Vascular Surgery

## 2021-11-26 ENCOUNTER — Inpatient Hospital Stay (HOSPITAL_COMMUNITY)
Admission: RE | Admit: 2021-11-26 | Discharge: 2021-12-03 | DRG: 271 | Disposition: A | Discharge status: Rehabilitation Facility | Payer: Medicaid Other | Attending: Vascular Surgery | Admitting: Vascular Surgery

## 2021-11-26 ENCOUNTER — Inpatient Hospital Stay (HOSPITAL_COMMUNITY): Payer: Medicaid Other | Admitting: Physician Assistant

## 2021-11-26 ENCOUNTER — Encounter (HOSPITAL_COMMUNITY)
Admission: RE | Disposition: A | Discharge status: Rehabilitation Facility | Payer: Self-pay | Source: Home / Self Care | Attending: Vascular Surgery

## 2021-11-26 DIAGNOSIS — I70221 Atherosclerosis of native arteries of extremities with rest pain, right leg: Secondary | ICD-10-CM

## 2021-11-26 DIAGNOSIS — D62 Acute posthemorrhagic anemia: Secondary | ICD-10-CM | POA: Diagnosis not present

## 2021-11-26 DIAGNOSIS — L97519 Non-pressure chronic ulcer of other part of right foot with unspecified severity: Secondary | ICD-10-CM | POA: Diagnosis present

## 2021-11-26 DIAGNOSIS — F1290 Cannabis use, unspecified, uncomplicated: Secondary | ICD-10-CM | POA: Diagnosis present

## 2021-11-26 DIAGNOSIS — F1210 Cannabis abuse, uncomplicated: Secondary | ICD-10-CM | POA: Diagnosis not present

## 2021-11-26 DIAGNOSIS — R748 Abnormal levels of other serum enzymes: Secondary | ICD-10-CM | POA: Diagnosis not present

## 2021-11-26 DIAGNOSIS — I350 Nonrheumatic aortic (valve) stenosis: Secondary | ICD-10-CM | POA: Diagnosis not present

## 2021-11-26 DIAGNOSIS — E876 Hypokalemia: Secondary | ICD-10-CM | POA: Diagnosis not present

## 2021-11-26 DIAGNOSIS — I482 Chronic atrial fibrillation, unspecified: Secondary | ICD-10-CM | POA: Diagnosis not present

## 2021-11-26 DIAGNOSIS — K219 Gastro-esophageal reflux disease without esophagitis: Secondary | ICD-10-CM | POA: Diagnosis present

## 2021-11-26 DIAGNOSIS — I4891 Unspecified atrial fibrillation: Secondary | ICD-10-CM | POA: Diagnosis not present

## 2021-11-26 DIAGNOSIS — Z823 Family history of stroke: Secondary | ICD-10-CM

## 2021-11-26 DIAGNOSIS — K5903 Drug induced constipation: Secondary | ICD-10-CM | POA: Diagnosis not present

## 2021-11-26 DIAGNOSIS — Q251 Coarctation of aorta: Secondary | ICD-10-CM | POA: Diagnosis not present

## 2021-11-26 DIAGNOSIS — I724 Aneurysm of artery of lower extremity: Secondary | ICD-10-CM

## 2021-11-26 DIAGNOSIS — Z95828 Presence of other vascular implants and grafts: Principal | ICD-10-CM

## 2021-11-26 DIAGNOSIS — R059 Cough, unspecified: Secondary | ICD-10-CM | POA: Diagnosis not present

## 2021-11-26 DIAGNOSIS — Z86718 Personal history of other venous thrombosis and embolism: Secondary | ICD-10-CM | POA: Diagnosis not present

## 2021-11-26 DIAGNOSIS — K5900 Constipation, unspecified: Secondary | ICD-10-CM | POA: Diagnosis present

## 2021-11-26 DIAGNOSIS — I745 Embolism and thrombosis of iliac artery: Secondary | ICD-10-CM | POA: Diagnosis not present

## 2021-11-26 DIAGNOSIS — R000 Tachycardia, unspecified: Secondary | ICD-10-CM | POA: Diagnosis not present

## 2021-11-26 DIAGNOSIS — E039 Hypothyroidism, unspecified: Secondary | ICD-10-CM | POA: Diagnosis present

## 2021-11-26 DIAGNOSIS — I7143 Infrarenal abdominal aortic aneurysm, without rupture: Secondary | ICD-10-CM | POA: Diagnosis not present

## 2021-11-26 DIAGNOSIS — R112 Nausea with vomiting, unspecified: Secondary | ICD-10-CM | POA: Diagnosis not present

## 2021-11-26 DIAGNOSIS — E8770 Fluid overload, unspecified: Secondary | ICD-10-CM | POA: Diagnosis not present

## 2021-11-26 DIAGNOSIS — R5381 Other malaise: Secondary | ICD-10-CM | POA: Diagnosis not present

## 2021-11-26 DIAGNOSIS — I10 Essential (primary) hypertension: Secondary | ICD-10-CM | POA: Diagnosis not present

## 2021-11-26 DIAGNOSIS — Z85818 Personal history of malignant neoplasm of other sites of lip, oral cavity, and pharynx: Secondary | ICD-10-CM

## 2021-11-26 DIAGNOSIS — Z923 Personal history of irradiation: Secondary | ICD-10-CM

## 2021-11-26 DIAGNOSIS — I723 Aneurysm of iliac artery: Secondary | ICD-10-CM

## 2021-11-26 DIAGNOSIS — F17200 Nicotine dependence, unspecified, uncomplicated: Secondary | ICD-10-CM | POA: Diagnosis not present

## 2021-11-26 DIAGNOSIS — I70201 Unspecified atherosclerosis of native arteries of extremities, right leg: Principal | ICD-10-CM | POA: Diagnosis present

## 2021-11-26 DIAGNOSIS — E782 Mixed hyperlipidemia: Secondary | ICD-10-CM | POA: Diagnosis present

## 2021-11-26 DIAGNOSIS — F17210 Nicotine dependence, cigarettes, uncomplicated: Secondary | ICD-10-CM | POA: Diagnosis present

## 2021-11-26 DIAGNOSIS — Z85828 Personal history of other malignant neoplasm of skin: Secondary | ICD-10-CM

## 2021-11-26 DIAGNOSIS — Z79899 Other long term (current) drug therapy: Secondary | ICD-10-CM

## 2021-11-26 DIAGNOSIS — K429 Umbilical hernia without obstruction or gangrene: Secondary | ICD-10-CM | POA: Diagnosis present

## 2021-11-26 DIAGNOSIS — R7989 Other specified abnormal findings of blood chemistry: Secondary | ICD-10-CM | POA: Diagnosis not present

## 2021-11-26 DIAGNOSIS — R1310 Dysphagia, unspecified: Secondary | ICD-10-CM | POA: Diagnosis not present

## 2021-11-26 DIAGNOSIS — I998 Other disorder of circulatory system: Secondary | ICD-10-CM | POA: Diagnosis not present

## 2021-11-26 DIAGNOSIS — I480 Paroxysmal atrial fibrillation: Secondary | ICD-10-CM | POA: Diagnosis not present

## 2021-11-26 HISTORY — PX: APPLICATION OF WOUND VAC: SHX5189

## 2021-11-26 HISTORY — PX: AORTA - BILATERAL FEMORAL ARTERY BYPASS GRAFT: SHX1175

## 2021-11-26 HISTORY — PX: FEMORAL-TIBIAL BYPASS GRAFT: SHX938

## 2021-11-26 LAB — CBC
HCT: 39.2 % (ref 39.0–52.0)
HCT: 39.5 % (ref 39.0–52.0)
Hemoglobin: 13.3 g/dL (ref 13.0–17.0)
Hemoglobin: 13.3 g/dL (ref 13.0–17.0)
MCH: 32 pg (ref 26.0–34.0)
MCH: 31.6 pg (ref 26.0–34.0)
MCHC: 33.9 g/dL (ref 30.0–36.0)
MCHC: 33.7 g/dL (ref 30.0–36.0)
MCV: 94.5 fL (ref 80.0–100.0)
MCV: 93.8 fL (ref 80.0–100.0)
Platelets: 240 10*3/uL (ref 150–400)
Platelets: 231 10*3/uL (ref 150–400)
RBC: 4.15 MIL/uL — ABNORMAL LOW (ref 4.22–5.81)
RBC: 4.21 MIL/uL — ABNORMAL LOW (ref 4.22–5.81)
RDW: 14 % (ref 11.5–15.5)
RDW: 14.1 % (ref 11.5–15.5)
WBC: 19.3 10*3/uL — ABNORMAL HIGH (ref 4.0–10.5)
WBC: 16.2 10*3/uL — ABNORMAL HIGH (ref 4.0–10.5)
nRBC: 0 % (ref 0.0–0.2)
nRBC: 0 % (ref 0.0–0.2)

## 2021-11-26 LAB — POCT I-STAT 7, (LYTES, BLD GAS, ICA,H+H)
Acid-Base Excess: 3 mmol/L — ABNORMAL HIGH (ref 0.0–2.0)
Acid-Base Excess: 0 mmol/L (ref 0.0–2.0)
Acid-Base Excess: 1 mmol/L (ref 0.0–2.0)
Acid-Base Excess: 2 mmol/L (ref 0.0–2.0)
Bicarbonate: 29.7 mmol/L — ABNORMAL HIGH (ref 20.0–28.0)
Bicarbonate: 26.6 mmol/L (ref 20.0–28.0)
Bicarbonate: 28.3 mmol/L — ABNORMAL HIGH (ref 20.0–28.0)
Bicarbonate: 28.1 mmol/L — ABNORMAL HIGH (ref 20.0–28.0)
Calcium, Ion: 1.25 mmol/L (ref 1.15–1.40)
Calcium, Ion: 1.15 mmol/L (ref 1.15–1.40)
Calcium, Ion: 1.19 mmol/L (ref 1.15–1.40)
Calcium, Ion: 1.03 mmol/L — ABNORMAL LOW (ref 1.15–1.40)
HCT: 36 % — ABNORMAL LOW (ref 39.0–52.0)
HCT: 36 % — ABNORMAL LOW (ref 39.0–52.0)
HCT: 34 % — ABNORMAL LOW (ref 39.0–52.0)
HCT: 38 % — ABNORMAL LOW (ref 39.0–52.0)
Hemoglobin: 12.2 g/dL — ABNORMAL LOW (ref 13.0–17.0)
Hemoglobin: 12.2 g/dL — ABNORMAL LOW (ref 13.0–17.0)
Hemoglobin: 11.6 g/dL — ABNORMAL LOW (ref 13.0–17.0)
Hemoglobin: 12.9 g/dL — ABNORMAL LOW (ref 13.0–17.0)
O2 Saturation: 99 %
O2 Saturation: 98 %
O2 Saturation: 98 %
O2 Saturation: 99 %
Patient temperature: 36.3
Patient temperature: 36.6
Patient temperature: 36.8
Patient temperature: 36.4
Potassium: 3.5 mmol/L (ref 3.5–5.1)
Potassium: 3.4 mmol/L — ABNORMAL LOW (ref 3.5–5.1)
Potassium: 3.8 mmol/L (ref 3.5–5.1)
Potassium: 3.9 mmol/L (ref 3.5–5.1)
Sodium: 139 mmol/L (ref 135–145)
Sodium: 140 mmol/L (ref 135–145)
Sodium: 139 mmol/L (ref 135–145)
Sodium: 139 mmol/L (ref 135–145)
TCO2: 31 mmol/L (ref 22–32)
TCO2: 28 mmol/L (ref 22–32)
TCO2: 30 mmol/L (ref 22–32)
TCO2: 29 mmol/L (ref 22–32)
pCO2 arterial: 54.5 mmHg — ABNORMAL HIGH (ref 32–48)
pCO2 arterial: 47.9 mmHg (ref 32–48)
pCO2 arterial: 56.1 mmHg — ABNORMAL HIGH (ref 32–48)
pCO2 arterial: 45.9 mmHg (ref 32–48)
pH, Arterial: 7.34 — ABNORMAL LOW (ref 7.35–7.45)
pH, Arterial: 7.35 (ref 7.35–7.45)
pH, Arterial: 7.309 — ABNORMAL LOW (ref 7.35–7.45)
pH, Arterial: 7.391 (ref 7.35–7.45)
pO2, Arterial: 148 mmHg — ABNORMAL HIGH (ref 83–108)
pO2, Arterial: 102 mmHg (ref 83–108)
pO2, Arterial: 124 mmHg — ABNORMAL HIGH (ref 83–108)
pO2, Arterial: 138 mmHg — ABNORMAL HIGH (ref 83–108)

## 2021-11-26 LAB — BASIC METABOLIC PANEL
Anion gap: 8 (ref 5–15)
BUN: 10 mg/dL (ref 8–23)
CO2: 23 mmol/L (ref 22–32)
Calcium: 8.8 mg/dL — ABNORMAL LOW (ref 8.9–10.3)
Chloride: 107 mmol/L (ref 98–111)
Creatinine, Ser: 1.04 mg/dL (ref 0.61–1.24)
GFR, Estimated: >60 mL/min (ref >60)
Glucose, Bld: 163 mg/dL — ABNORMAL HIGH (ref 70–99)
Potassium: 4 mmol/L (ref 3.5–5.1)
Sodium: 138 mmol/L (ref 135–145)

## 2021-11-26 LAB — POCT ACTIVATED CLOTTING TIME
Activated Clotting Time: 203 seconds
Activated Clotting Time: 215 seconds
Activated Clotting Time: 233 seconds
Activated Clotting Time: 245 seconds
Activated Clotting Time: 233 seconds
Activated Clotting Time: 239 seconds
Activated Clotting Time: 215 seconds
Activated Clotting Time: 239 seconds

## 2021-11-26 LAB — PROTIME-INR
INR: 1.1 (ref 0.8–1.2)
INR: 1.2 (ref 0.8–1.2)
Prothrombin Time: 13.6 seconds (ref 11.4–15.2)
Prothrombin Time: 14.8 seconds (ref 11.4–15.2)

## 2021-11-26 LAB — BLOOD GAS, ARTERIAL
Acid-Base Excess: 0 mmol/L (ref 0.0–2.0)
Bicarbonate: 24.8 mmol/L (ref 20.0–28.0)
O2 Saturation: 99.2 %
Patient temperature: 36.3
pCO2 arterial: 39 mmHg (ref 32–48)
pH, Arterial: 7.41 (ref 7.35–7.45)
pO2, Arterial: 93 mmHg (ref 83–108)

## 2021-11-26 LAB — MAGNESIUM: Magnesium: 1.6 mg/dL — ABNORMAL LOW (ref 1.7–2.4)

## 2021-11-26 LAB — APTT
aPTT: 30 seconds (ref 24–36)
aPTT: 34 seconds (ref 24–36)

## 2021-11-26 LAB — PREPARE RBC (CROSSMATCH)

## 2021-11-26 LAB — ABO/RH: ABO/RH(D): B POS

## 2021-11-26 SURGERY — CREATION, BYPASS, ARTERIAL, AORTA TO FEMORAL, BILATERAL, USING GRAFT
Anesthesia: General | Site: Groin | Laterality: Right

## 2021-11-26 MED ORDER — POTASSIUM CHLORIDE CRYS ER 20 MEQ PO TBCR
20.0000 meq | EXTENDED_RELEASE_TABLET | Freq: Every day | ORAL | Status: AC | PRN
  Administered 2021-12-02: 40 meq via ORAL
  Filled 2021-11-26: qty 2

## 2021-11-26 MED ORDER — ACETAMINOPHEN 325 MG PO TABS
325.0000 mg | ORAL_TABLET | ORAL | Status: DC | PRN
  Filled 2021-11-26: qty 1

## 2021-11-26 MED ORDER — LACTATED RINGERS IV SOLN: INTRAVENOUS | Status: DC | PRN

## 2021-11-26 MED ORDER — ROCURONIUM BROMIDE 10 MG/ML (PF) SYRINGE
PREFILLED_SYRINGE | INTRAVENOUS | Status: DC | PRN
  Administered 2021-11-26: 50 mg via INTRAVENOUS
  Administered 2021-11-26: 40 mg via INTRAVENOUS
  Administered 2021-11-26: 20 mg via INTRAVENOUS
  Administered 2021-11-26: 50 mg via INTRAVENOUS
  Administered 2021-11-26: 30 mg via INTRAVENOUS
  Administered 2021-11-26: 50 mg via INTRAVENOUS
  Administered 2021-11-26: 20 mg via INTRAVENOUS

## 2021-11-26 MED ORDER — ACETAMINOPHEN 325 MG RE SUPP
325.0000 mg | RECTAL | Status: DC | PRN
  Filled 2021-11-26: qty 2

## 2021-11-26 MED ORDER — ROCURONIUM BROMIDE 10 MG/ML (PF) SYRINGE
PREFILLED_SYRINGE | INTRAVENOUS | Status: AC
  Filled 2021-11-26: qty 10

## 2021-11-26 MED ORDER — SODIUM CHLORIDE (PF) 0.9 % IJ SOLN
INTRAMUSCULAR | Status: AC
  Filled 2021-11-26: qty 10

## 2021-11-26 MED ORDER — HEMOSTATIC AGENTS (NO CHARGE) OPTIME
TOPICAL | Status: DC | PRN
  Administered 2021-11-26 (×2): 1 via TOPICAL

## 2021-11-26 MED ORDER — DEXAMETHASONE SODIUM PHOSPHATE 10 MG/ML IJ SOLN
INTRAMUSCULAR | Status: DC | PRN
  Administered 2021-11-26: 10 mg via INTRAVENOUS

## 2021-11-26 MED ORDER — MAGNESIUM HYDROXIDE 400 MG/5ML PO SUSP: 30.0000 mL | Freq: Every day | ORAL | Status: DC | PRN

## 2021-11-26 MED ORDER — PROPOFOL 10 MG/ML IV BOLUS
INTRAVENOUS | Status: AC
  Filled 2021-11-26: qty 20

## 2021-11-26 MED ORDER — OXYCODONE HCL 5 MG PO TABS: 5.0000 mg | ORAL_TABLET | Freq: Once | ORAL | Status: DC | PRN

## 2021-11-26 MED ORDER — MIDAZOLAM HCL 2 MG/2ML IJ SOLN
INTRAMUSCULAR | Status: DC | PRN
  Administered 2021-11-26 (×2): 1 mg via INTRAVENOUS

## 2021-11-26 MED ORDER — PHENYLEPHRINE 80 MCG/ML (10ML) SYRINGE FOR IV PUSH (FOR BLOOD PRESSURE SUPPORT)
PREFILLED_SYRINGE | INTRAVENOUS | Status: DC | PRN
  Administered 2021-11-26 (×2): 80 ug via INTRAVENOUS
  Administered 2021-11-26: 160 ug via INTRAVENOUS
  Administered 2021-11-26 (×5): 80 ug via INTRAVENOUS
  Administered 2021-11-26: 120 ug via INTRAVENOUS
  Administered 2021-11-26 (×2): 80 ug via INTRAVENOUS
  Administered 2021-11-26: 160 ug via INTRAVENOUS

## 2021-11-26 MED ORDER — LACTATED RINGERS IV SOLN: INTRAVENOUS | Status: DC

## 2021-11-26 MED ORDER — BISACODYL 10 MG RE SUPP: 10.0000 mg | Freq: Every day | RECTAL | Status: DC | PRN

## 2021-11-26 MED ORDER — FENTANYL CITRATE (PF) 250 MCG/5ML IJ SOLN
INTRAMUSCULAR | Status: AC
  Filled 2021-11-26: qty 5

## 2021-11-26 MED ORDER — ALUM & MAG HYDROXIDE-SIMETH 200-200-20 MG/5ML PO SUSP: 15.0000 mL | ORAL | Status: DC | PRN

## 2021-11-26 MED ORDER — HEPARIN 6000 UNIT IRRIGATION SOLUTION
Status: DC | PRN
  Administered 2021-11-26: 1

## 2021-11-26 MED ORDER — FENTANYL CITRATE (PF) 100 MCG/2ML IJ SOLN: 25.0000 ug | INTRAMUSCULAR | Status: DC | PRN

## 2021-11-26 MED ORDER — OXYCODONE HCL 5 MG/5ML PO SOLN: 5.0000 mg | Freq: Once | ORAL | Status: DC | PRN

## 2021-11-26 MED ORDER — CHLORHEXIDINE GLUCONATE CLOTH 2 % EX PADS: 6.0000 | MEDICATED_PAD | Freq: Once | CUTANEOUS | Status: DC

## 2021-11-26 MED ORDER — ROCURONIUM BROMIDE 10 MG/ML (PF) SYRINGE
PREFILLED_SYRINGE | INTRAVENOUS | Status: AC
  Filled 2021-11-26: qty 20

## 2021-11-26 MED ORDER — ROPIVACAINE HCL 2 MG/ML IJ SOLN
INTRAMUSCULAR | Status: DC | PRN
  Administered 2021-11-26: 5 mL via EPIDURAL

## 2021-11-26 MED ORDER — HYDRALAZINE HCL 20 MG/ML IJ SOLN: 5.0000 mg | INTRAMUSCULAR | Status: DC | PRN

## 2021-11-26 MED ORDER — SUCCINYLCHOLINE CHLORIDE 200 MG/10ML IV SOSY
PREFILLED_SYRINGE | INTRAVENOUS | Status: AC
  Filled 2021-11-26: qty 10

## 2021-11-26 MED ORDER — PROTAMINE SULFATE 10 MG/ML IV SOLN
INTRAVENOUS | Status: DC | PRN
  Administered 2021-11-26: 50 mg via INTRAVENOUS

## 2021-11-26 MED ORDER — PHENYLEPHRINE HCL-NACL 20-0.9 MG/250ML-% IV SOLN
INTRAVENOUS | Status: DC | PRN
  Administered 2021-11-26: 20 ug/min via INTRAVENOUS

## 2021-11-26 MED ORDER — METOPROLOL TARTRATE 5 MG/5ML IV SOLN: 2.0000 mg | INTRAVENOUS | Status: DC | PRN

## 2021-11-26 MED ORDER — ALBUMIN HUMAN 5 % IV SOLN: INTRAVENOUS | Status: DC | PRN

## 2021-11-26 MED ORDER — CALCIUM CHLORIDE 10 % IV SOLN
INTRAVENOUS | Status: DC | PRN
  Administered 2021-11-26 (×2): 100 mg via INTRAVENOUS

## 2021-11-26 MED ORDER — ROPIVACAINE HCL 2 MG/ML IJ SOLN
INTRAMUSCULAR | Status: DC | PRN
  Administered 2021-11-26: 8 mL/h via EPIDURAL

## 2021-11-26 MED ORDER — HEMOSTATIC AGENTS (NO CHARGE) OPTIME
TOPICAL | Status: DC | PRN
Start: 2021-11-26 — End: 2021-11-26
  Administered 2021-11-26: 1 via TOPICAL

## 2021-11-26 MED ORDER — DEXAMETHASONE SODIUM PHOSPHATE 10 MG/ML IJ SOLN
INTRAMUSCULAR | Status: AC
  Filled 2021-11-26: qty 1

## 2021-11-26 MED ORDER — SODIUM CHLORIDE 0.9 % IV SOLN: INTRAVENOUS | Status: DC | PRN

## 2021-11-26 MED ORDER — SODIUM CHLORIDE 0.9 % IV SOLN: INTRAVENOUS | Status: DC

## 2021-11-26 MED ORDER — PANTOPRAZOLE SODIUM 40 MG IV SOLR
40.0000 mg | Freq: Every day | INTRAVENOUS | Status: DC
  Administered 2021-11-26 – 2021-12-01 (×6): 40 mg via INTRAVENOUS
  Filled 2021-11-26 (×6): qty 10

## 2021-11-26 MED ORDER — PROPOFOL 10 MG/ML IV BOLUS
INTRAVENOUS | Status: DC | PRN
  Administered 2021-11-26: 50 mg via INTRAVENOUS
  Administered 2021-11-26 (×3): 40 mg via INTRAVENOUS

## 2021-11-26 MED ORDER — ACETAMINOPHEN 10 MG/ML IV SOLN
1000.0000 mg | Freq: Once | INTRAVENOUS | Status: DC | PRN
  Administered 2021-11-26: 1000 mg via INTRAVENOUS

## 2021-11-26 MED ORDER — LABETALOL HCL 5 MG/ML IV SOLN: 10.0000 mg | INTRAVENOUS | Status: DC | PRN

## 2021-11-26 MED ORDER — 0.9 % SODIUM CHLORIDE (POUR BTL) OPTIME
TOPICAL | Status: DC | PRN
  Administered 2021-11-26: 1000 mL
  Administered 2021-11-26: 2000 mL

## 2021-11-26 MED ORDER — ONDANSETRON HCL 4 MG/2ML IJ SOLN
INTRAMUSCULAR | Status: DC | PRN
  Administered 2021-11-26: 4 mg via INTRAVENOUS

## 2021-11-26 MED ORDER — CEFAZOLIN SODIUM-DEXTROSE 2-4 GM/100ML-% IV SOLN
2.0000 g | Freq: Three times a day (TID) | INTRAVENOUS | Status: AC
  Administered 2021-11-26 – 2021-11-27 (×2): 2 g via INTRAVENOUS
  Filled 2021-11-26 (×2): qty 100

## 2021-11-26 MED ORDER — CHLORHEXIDINE GLUCONATE 0.12 % MT SOLN: 15.0000 mL | OROMUCOSAL | Status: AC

## 2021-11-26 MED ORDER — SUCCINYLCHOLINE CHLORIDE 200 MG/10ML IV SOSY
PREFILLED_SYRINGE | INTRAVENOUS | Status: DC | PRN
  Administered 2021-11-26: 100 mg via INTRAVENOUS

## 2021-11-26 MED ORDER — FENTANYL CITRATE (PF) 250 MCG/5ML IJ SOLN
INTRAMUSCULAR | Status: DC | PRN
  Administered 2021-11-26: 25 ug via INTRAVENOUS

## 2021-11-26 MED ORDER — HEPARIN 6000 UNIT IRRIGATION SOLUTION
Status: AC
  Filled 2021-11-26: qty 500

## 2021-11-26 MED ORDER — HEPARIN SODIUM (PORCINE) 1000 UNIT/ML IJ SOLN
INTRAMUSCULAR | Status: DC | PRN
  Administered 2021-11-26: 1000 [IU] via INTRAVENOUS
  Administered 2021-11-26: 2000 [IU] via INTRAVENOUS
  Administered 2021-11-26: 3000 [IU] via INTRAVENOUS
  Administered 2021-11-26: 5000 [IU] via INTRAVENOUS
  Administered 2021-11-26: 2000 [IU] via INTRAVENOUS
  Administered 2021-11-26: 1000 [IU] via INTRAVENOUS
  Administered 2021-11-26: 2000 [IU] via INTRAVENOUS
  Administered 2021-11-26: 1000 [IU] via INTRAVENOUS
  Administered 2021-11-26: 10000 [IU] via INTRAVENOUS

## 2021-11-26 MED ORDER — FENTANYL CITRATE (PF) 100 MCG/2ML IJ SOLN
INTRAMUSCULAR | Status: AC
  Filled 2021-11-26: qty 2

## 2021-11-26 MED ORDER — CEFAZOLIN SODIUM-DEXTROSE 2-4 GM/100ML-% IV SOLN
INTRAVENOUS | Status: AC
  Filled 2021-11-26: qty 100

## 2021-11-26 MED ORDER — ACETAMINOPHEN 160 MG/5ML PO SOLN: 1000.0000 mg | Freq: Once | ORAL | Status: DC | PRN

## 2021-11-26 MED ORDER — ALBUMIN HUMAN 5 % IV SOLN
INTRAVENOUS | Status: AC
  Filled 2021-11-26: qty 250

## 2021-11-26 MED ORDER — ACETAMINOPHEN 500 MG PO TABS: 1000.0000 mg | ORAL_TABLET | Freq: Once | ORAL | Status: DC | PRN

## 2021-11-26 MED ORDER — CEFAZOLIN SODIUM 1 G IJ SOLR
INTRAMUSCULAR | Status: AC
  Filled 2021-11-26: qty 20

## 2021-11-26 MED ORDER — SODIUM CHLORIDE 0.9% IV SOLUTION: Freq: Once | INTRAVENOUS | Status: DC

## 2021-11-26 MED ORDER — MIDAZOLAM HCL 2 MG/2ML IJ SOLN
INTRAMUSCULAR | Status: AC
  Filled 2021-11-26: qty 2

## 2021-11-26 MED ORDER — LIDOCAINE HCL (PF) 4 % IJ SOLN
INTRAMUSCULAR | Status: DC | PRN
Start: 2021-11-26 — End: 2021-11-27
  Administered 2021-11-26: 200 mg via RESPIRATORY_TRACT

## 2021-11-26 MED ORDER — MORPHINE SULFATE (PF) 2 MG/ML IV SOLN
2.0000 mg | INTRAVENOUS | Status: DC | PRN
  Administered 2021-11-27 – 2021-12-01 (×6): 2 mg via INTRAVENOUS
  Filled 2021-11-26 (×7): qty 1

## 2021-11-26 MED ORDER — ALBUMIN HUMAN 5 % IV SOLN
12.5000 g | Freq: Once | INTRAVENOUS | Status: AC
  Administered 2021-11-26: 12.5 g via INTRAVENOUS

## 2021-11-26 MED ORDER — ONDANSETRON HCL 4 MG/2ML IJ SOLN
4.0000 mg | Freq: Four times a day (QID) | INTRAMUSCULAR | Status: DC | PRN
  Administered 2021-11-26 – 2021-12-02 (×7): 4 mg via INTRAVENOUS
  Filled 2021-11-26 (×7): qty 2

## 2021-11-26 MED ORDER — MAGNESIUM SULFATE 2 GM/50ML IV SOLN
2.0000 g | Freq: Every day | INTRAVENOUS | Status: AC | PRN
  Administered 2021-11-27: 2 g via INTRAVENOUS
  Filled 2021-11-26: qty 50

## 2021-11-26 MED ORDER — HEPARIN SODIUM (PORCINE) 1000 UNIT/ML IJ SOLN
INTRAMUSCULAR | Status: AC
  Filled 2021-11-26: qty 30

## 2021-11-26 MED ORDER — ROPIVACAINE HCL 2 MG/ML IJ SOLN
8.0000 mL/h | INTRAMUSCULAR | Status: DC
  Administered 2021-11-26: 8 mL/h via EPIDURAL
  Filled 2021-11-26 (×8): qty 20

## 2021-11-26 MED ORDER — ONDANSETRON HCL 4 MG/2ML IJ SOLN
INTRAMUSCULAR | Status: AC
  Filled 2021-11-26: qty 2

## 2021-11-26 MED ORDER — PHENOL 1.4 % MT LIQD: 1.0000 | OROMUCOSAL | Status: DC | PRN

## 2021-11-26 MED ORDER — CHLORHEXIDINE GLUCONATE CLOTH 2 % EX PADS
6.0000 | MEDICATED_PAD | Freq: Every day | CUTANEOUS | Status: DC
  Administered 2021-11-26 – 2021-12-03 (×7): 6 via TOPICAL

## 2021-11-26 MED ORDER — CHLORHEXIDINE GLUCONATE 0.12 % MT SOLN
OROMUCOSAL | Status: AC
Start: 2021-11-26 — End: 2021-11-26
  Administered 2021-11-26: 15 mL via OROMUCOSAL
  Filled 2021-11-26: qty 15

## 2021-11-26 MED ORDER — PHENYLEPHRINE HCL-NACL 20-0.9 MG/250ML-% IV SOLN
0.0000 ug/min | INTRAVENOUS | Status: DC
  Administered 2021-11-26: 20 ug/min via INTRAVENOUS
  Administered 2021-11-27: 50 ug/min via INTRAVENOUS
  Administered 2021-11-27: 70 ug/min via INTRAVENOUS
  Administered 2021-11-27: 10 ug/min via INTRAVENOUS
  Administered 2021-11-27: 60 ug/min via INTRAVENOUS
  Filled 2021-11-26 (×5): qty 250

## 2021-11-26 MED ORDER — GUAIFENESIN-DM 100-10 MG/5ML PO SYRP: 15.0000 mL | ORAL_SOLUTION | ORAL | Status: DC | PRN

## 2021-11-26 MED ORDER — SODIUM CHLORIDE 0.9 % IV SOLN: 500.0000 mL | Freq: Once | INTRAVENOUS | Status: DC | PRN

## 2021-11-26 MED ORDER — PHENYLEPHRINE 80 MCG/ML (10ML) SYRINGE FOR IV PUSH (FOR BLOOD PRESSURE SUPPORT)
PREFILLED_SYRINGE | INTRAVENOUS | Status: AC
  Filled 2021-11-26: qty 10

## 2021-11-26 MED ORDER — CEFAZOLIN SODIUM-DEXTROSE 2-4 GM/100ML-% IV SOLN
2.0000 g | INTRAVENOUS | Status: AC
  Administered 2021-11-26 (×2): 2 g via INTRAVENOUS

## 2021-11-26 MED ORDER — PROTAMINE SULFATE 10 MG/ML IV SOLN
INTRAVENOUS | Status: AC
  Filled 2021-11-26: qty 5

## 2021-11-26 MED ORDER — LIDOCAINE-EPINEPHRINE (PF) 2 %-1:200000 IJ SOLN
INTRAMUSCULAR | Status: DC | PRN
  Administered 2021-11-26: 5 mL via EPIDURAL

## 2021-11-26 SURGICAL SUPPLY — 112 items
ADH SKN CLS APL DERMABOND .7 (GAUZE/BANDAGES/DRESSINGS) ×20
ADH SKN CLS LQ APL DERMABOND (GAUZE/BANDAGES/DRESSINGS) ×8
AGENT HMST 10 BLLW SHRT CANN (HEMOSTASIS) ×4
APL PRP STRL LF DISP 70% ISPRP (MISCELLANEOUS) ×28
APL SKNCLS STERI-STRIP NONHPOA (GAUZE/BANDAGES/DRESSINGS) ×12
BAG COUNTER SPONGE SURGICOUNT (BAG) ×6 IMPLANT
BAG ISL DRAPE 18X18 STRL (DRAPES) ×8
BAG ISOLATION DRAPE 18X18 (DRAPES) IMPLANT
BAG SPNG CNTER NS LX DISP (BAG) ×4
BANDAGE ESMARK 6X9 LF (GAUZE/BANDAGES/DRESSINGS) IMPLANT
BENZOIN TINCTURE PRP APPL 2/3 (GAUZE/BANDAGES/DRESSINGS) ×18 IMPLANT
BNDG CMPR 9X6 STRL LF SNTH (GAUZE/BANDAGES/DRESSINGS) ×4
BNDG ESMARK 6X9 LF (GAUZE/BANDAGES/DRESSINGS) ×5
CANISTER SUCT 3000ML PPV (MISCELLANEOUS) ×6 IMPLANT
CANNULA VESSEL 3MM 2 BLNT TIP (CANNULA) ×6 IMPLANT
CATH ROBINSON RED A/P 18FR (CATHETERS) ×12 IMPLANT
CHLORAPREP W/TINT 26 (MISCELLANEOUS) ×17 IMPLANT
CLIP LIGATING EXTRA MED SLVR (CLIP) ×1 IMPLANT
CLIP LIGATING EXTRA SM BLUE (MISCELLANEOUS) ×2 IMPLANT
COVER PROBE W GEL 5X96 (DRAPES) ×7 IMPLANT
CUFF TOURN SGL QUICK 24 (TOURNIQUET CUFF)
CUFF TOURN SGL QUICK 34 (TOURNIQUET CUFF) ×5
CUFF TOURN SGL QUICK 42 (TOURNIQUET CUFF) IMPLANT
CUFF TRNQT CYL 24X4X16.5-23 (TOURNIQUET CUFF) IMPLANT
CUFF TRNQT CYL 34X4.125X (TOURNIQUET CUFF) IMPLANT
DERMABOND ADHESIVE PROPEN (GAUZE/BANDAGES/DRESSINGS) ×2
DERMABOND ADVANCED (GAUZE/BANDAGES/DRESSINGS) ×5
DERMABOND ADVANCED .7 DNX12 (GAUZE/BANDAGES/DRESSINGS) ×10 IMPLANT
DERMABOND ADVANCED .7 DNX6 (GAUZE/BANDAGES/DRESSINGS) IMPLANT
DRAIN CHANNEL 15F RND FF W/TCR (WOUND CARE) IMPLANT
DRAPE C-ARM 42X72 X-RAY (DRAPES) IMPLANT
DRAPE HALF SHEET 40X57 (DRAPES) IMPLANT
DRAPE ISOLATION BAG 18X18 (DRAPES) ×10
DRAPE X-RAY CASS 24X20 (DRAPES) IMPLANT
DRESSING PEEL AND PLC PRVNA 13 (GAUZE/BANDAGES/DRESSINGS) IMPLANT
DRSG PEEL AND PLACE PREVENA 13 (GAUZE/BANDAGES/DRESSINGS) ×5
ELECT BLADE 4.0 EZ CLEAN MEGAD (MISCELLANEOUS) ×5
ELECT BLADE 6.5 EXT (BLADE) IMPLANT
ELECT REM PT RETURN 9FT ADLT (ELECTROSURGICAL) ×5
ELECTRODE BLDE 4.0 EZ CLN MEGD (MISCELLANEOUS) ×5 IMPLANT
ELECTRODE REM PT RTRN 9FT ADLT (ELECTROSURGICAL) ×5 IMPLANT
EVACUATOR SILICONE 100CC (DRAIN) IMPLANT
FELT TEFLON 1X6 (MISCELLANEOUS) ×1 IMPLANT
GAUZE 4X4 16PLY ~~LOC~~+RFID DBL (SPONGE) ×2 IMPLANT
GAUZE SPONGE 4X4 12PLY STRL (GAUZE/BANDAGES/DRESSINGS) ×6 IMPLANT
GLOVE BIO SURGEON STRL SZ 6.5 (GLOVE) ×1 IMPLANT
GLOVE BIO SURGEON STRL SZ8 (GLOVE) ×6 IMPLANT
GLOVE BIOGEL PI IND STRL 7.0 (GLOVE) IMPLANT
GLOVE BIOGEL PI INDICATOR 7.0 (GLOVE) ×1
GLOVE SURG SS PI 8.0 STRL IVOR (GLOVE) ×12 IMPLANT
GOWN STRL REUS W/ TWL LRG LVL3 (GOWN DISPOSABLE) ×10 IMPLANT
GOWN STRL REUS W/ TWL XL LVL3 (GOWN DISPOSABLE) ×5 IMPLANT
GOWN STRL REUS W/TWL LRG LVL3 (GOWN DISPOSABLE) ×10
GOWN STRL REUS W/TWL XL LVL3 (GOWN DISPOSABLE) ×5
GRAFT CV 60X8STRG TUBE KNTD (Vascular Products) IMPLANT
GRAFT HEMASHIELD 20X11M (Vascular Products) ×1 IMPLANT
GRAFT HEMASHIELD 8MM (Vascular Products) ×5 IMPLANT
GRAFT PROPATEN W/RING 6X80X60 (Vascular Products) ×1 IMPLANT
HEMOSTAT HEMOBLAST BELLOWS (HEMOSTASIS) ×1 IMPLANT
HEMOSTAT SNOW SURGICEL 2X4 (HEMOSTASIS) ×2 IMPLANT
INSERT FOGARTY 61MM (MISCELLANEOUS) IMPLANT
INSERT FOGARTY SM (MISCELLANEOUS) ×12 IMPLANT
KIT BASIN OR (CUSTOM PROCEDURE TRAY) ×6 IMPLANT
KIT TURNOVER KIT B (KITS) ×6 IMPLANT
MARKER GRAFT CORONARY BYPASS (MISCELLANEOUS) IMPLANT
NS IRRIG 1000ML POUR BTL (IV SOLUTION) ×12 IMPLANT
PACK AORTA (CUSTOM PROCEDURE TRAY) ×6 IMPLANT
PACK PERIPHERAL VASCULAR (CUSTOM PROCEDURE TRAY) ×5 IMPLANT
PAD ARMBOARD 7.5X6 YLW CONV (MISCELLANEOUS) ×12 IMPLANT
PAD NEG PRESSURE SENSATRAC (MISCELLANEOUS) ×1 IMPLANT
PENCIL BUTTON HOLSTER BLD 10FT (ELECTRODE) ×1 IMPLANT
SET COLLECT BLD 21X3/4 12 (NEEDLE) IMPLANT
SET WALTER ACTIVATION W/DRAPE (SET/KITS/TRAYS/PACK) IMPLANT
SPONGE T-LAP 18X18 ~~LOC~~+RFID (SPONGE) ×7 IMPLANT
STOPCOCK 4 WAY LG BORE MALE ST (IV SETS) IMPLANT
STRIP CLOSURE SKIN 1/2X4 (GAUZE/BANDAGES/DRESSINGS) ×15 IMPLANT
SUT ETHIBOND 0 36 GRN (SUTURE) ×1 IMPLANT
SUT ETHILON 3 0 PS 1 (SUTURE) IMPLANT
SUT MNCRL AB 4-0 PS2 18 (SUTURE) ×14 IMPLANT
SUT PDS AB 1 TP1 54 (SUTURE) ×12 IMPLANT
SUT PROLENE 3 0 SH1 36 (SUTURE) ×6 IMPLANT
SUT PROLENE 4 0 RB 1 (SUTURE) ×20
SUT PROLENE 4-0 RB1 .5 CRCL 36 (SUTURE) ×10 IMPLANT
SUT PROLENE 5 0 C 1 24 (SUTURE) ×14 IMPLANT
SUT PROLENE 5 0 C 1 36 (SUTURE) ×4 IMPLANT
SUT PROLENE 6 0 BV (SUTURE) ×9 IMPLANT
SUT SILK 2 0 (SUTURE) ×5
SUT SILK 2 0 SH (SUTURE) ×6 IMPLANT
SUT SILK 2 0 TIES 17X18 (SUTURE) ×5
SUT SILK 2 0SH CR/8 30 (SUTURE) ×6 IMPLANT
SUT SILK 2-0 18XBRD TIE 12 (SUTURE) ×5 IMPLANT
SUT SILK 2-0 18XBRD TIE BLK (SUTURE) ×5 IMPLANT
SUT SILK 3 0 (SUTURE) ×5
SUT SILK 3 0 TIES 10X30 (SUTURE) ×1 IMPLANT
SUT SILK 3 0 TIES 17X18 (SUTURE) ×5
SUT SILK 3 0SH CR/8 30 (SUTURE) ×1 IMPLANT
SUT SILK 3-0 18XBRD TIE 12 (SUTURE) ×5 IMPLANT
SUT SILK 3-0 18XBRD TIE BLK (SUTURE) ×5 IMPLANT
SUT VIC AB 0 CT1 18XCR BRD 8 (SUTURE) ×10 IMPLANT
SUT VIC AB 0 CT1 8-18 (SUTURE) ×5
SUT VIC AB 2-0 CT1 27 (SUTURE) ×15
SUT VIC AB 2-0 CT1 TAPERPNT 27 (SUTURE) ×10 IMPLANT
SUT VIC AB 3-0 SH 27 (SUTURE) ×20
SUT VIC AB 3-0 SH 27X BRD (SUTURE) ×10 IMPLANT
SYR BULB IRRIG 60ML STRL (SYRINGE) ×6 IMPLANT
TAPE UMBILICAL 1/8X30 (MISCELLANEOUS) ×6 IMPLANT
TAPE UMBILICAL COTTON 1/8X30 (MISCELLANEOUS) ×6 IMPLANT
TOWEL GREEN STERILE (TOWEL DISPOSABLE) ×6 IMPLANT
TOWEL ~~LOC~~+RFID 17X26 BLUE (SPONGE) ×6 IMPLANT
TRAY FOLEY MTR SLVR 16FR STAT (SET/KITS/TRAYS/PACK) ×6 IMPLANT
UNDERPAD 30X36 HEAVY ABSORB (UNDERPADS AND DIAPERS) ×6 IMPLANT
WATER STERILE IRR 1000ML POUR (IV SOLUTION) ×12 IMPLANT

## 2021-11-26 NOTE — Anesthesia Procedure Notes (Signed)
Procedure Name: Intubation ?Date/Time: 11/26/2021 9:44 AM ?Performed by: Oleta Mouse, MD ?Pre-anesthesia Checklist: Patient identified, Emergency Drugs available, Suction available, Patient being monitored and Timeout performed ?Patient Re-evaluated:Patient Re-evaluated prior to induction ?Oxygen Delivery Method: Circle system utilized ?Preoxygenation: Pre-oxygenation with 100% oxygen ?Laryngoscope Size: Glidescope ?Grade View: Grade II ?Tube type: Parker flex tip ?Tube size: 7.5 mm ?Number of attempts: 2 ?Airway Equipment and Method: Video-laryngoscopy, Bougie stylet, Fiberoptic brochoscope and Stylet ?Placement Confirmation: ETT inserted through vocal cords under direct vision, breath sounds checked- equal and bilateral and positive ETCO2 ?Secured at: 21 cm ?Tube secured with: Tape ?Dental Injury: Teeth and Oropharynx as per pre-operative assessment  ?Difficulty Due To: Difficulty was anticipated ?Future Recommendations: Recommend- induction with short-acting agent, and alternative techniques readily available ?Comments: Patient pretreated with nebulized lidocaine prior to preoxygenation. Minimal sedation given to allow toleration of glidescope insertion. Initial visualization of epiglottis only. Sedation deepened with grade 2- view obtained. Bronch inserted into oropharynx and eventually passed through vocal cords and into right main bronchus. Patient spontaneously breathing throughout. Extreme anterior location of the airway made accessing airway difficult and flimsiness of bronch prevented passage of ETT through cords over bronch. Sux given to aide relaxation. Mask ventilation adequate. Glidescope reinserted with grade 2 view. ETT placed over stylet through vocal cords under direct visualization. Deepening of anesthetic and paralytic improved overall intubating conditions.  ? ? ? ? ?

## 2021-11-26 NOTE — Anesthesia Procedure Notes (Signed)
Epidural ?Patient location during procedure: pre-op ?Start time: 11/26/2021 8:21 AM ?End time: 11/26/2021 8:33 AM ? ?Staffing ?Anesthesiologist: Oleta Mouse, MD ?Performed: anesthesiologist  ? ?Preanesthetic Checklist ?Completed: patient identified, IV checked, risks and benefits discussed, monitors and equipment checked, pre-op evaluation and timeout performed ? ?Epidural ?Patient position: sitting ?Prep: DuraPrep ?Patient monitoring: heart rate, continuous pulse ox and blood pressure ?Approach: midline ?Location: thoracic (1-12) ?Injection technique: LOR saline ? ?Needle:  ?Needle type: Tuohy  ?Needle gauge: 17 G ?Needle length: 9 cm ?Needle insertion depth: 7 cm ?Catheter type: closed end flexible ?Catheter size: 19 Gauge ?Catheter at skin depth: 14 cm ?Test dose: negative and 2% lidocaine with Epi 1:200 K ? ?Assessment ?Events: blood not aspirated, injection not painful, no injection resistance, no paresthesia and negative IV test ? ?Additional Notes ?T8/9 ? ? ? ?

## 2021-11-26 NOTE — Op Note (Signed)
DATE OF SERVICE: 11/26/2021 ? ?PATIENT:  [name] [name]  61 y.o. male ? 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 ?POST-OPERATIVE DIAGNOSIS:  Same ? ?PROCEDURE:   ?1) aorto-bi-iliac bypass ([id number]) ?2) right ilio-femoral bypass ([id number]) ?3) right femoral - posterior tibial artery bypass (6mm)46m) umbilical hernia repair ? ?CO-SURGEONS:   ?   * HawkeCherre Robins?   * ClaCarlis AbbottisGwenyth Allegra? ?ASSISTANT: CorriPaulo FruitC ? ?An experienced assistant was required given the complexity of this procedure and the standard of surgical care. My assistant helped with exposure through counter tension, suctioning, ligation and retraction to better visualize the surgical field.  My assistant expedited sewing during the case by following my sutures. Wherever I use the term "we" in the report, my assistant actively helped me with that portion of the procedure. ? ?MODIFIER:  a 22 modifier is appropriate given the extreme complexity and the patient's revascularization plan, the mixed aneurysmal and occlusive disease, the multilevel occlusive disease requiring complex revascularization. This was at least 200% more difficult than typical cases involving these procedures. ? ?ANESTHESIA:   epidural and general ? ?EBL: 3500mL 22mOP: 3500mL ?62mOOD ADMINISTERED: 2U PRBC; 2U FFP ? ?DRAINS: none  ? ?LOCAL MEDICATIONS USED:  NONE ? ?SPECIMEN:  none ? ?COUNTS: confirmed correct. ? ?TOURNIQUET:   ?Total Tourniquet Time Documented: ?Thigh (Right) - 11 minutes ?Total: Thigh (Right) - 11 minutes ? ? ?PATIENT DISPOSITION:  ICU - intubated and hemodynamically stable. ?  ?Delay start of Pharmacological VTE agent (>24hrs) due to surgical blood loss or risk of bleeding:  no ? ?INDICATION FOR PROCEDURE: [name] VERLIN UHER1 y.o.27ale with highly complex aneurysmal and occlusive disease (see above for details). He underwent an exhaustive preoperative evaluation. After careful discussion of risks, benefits, and alternatives the patient was offered aorto-bi-iliac bypass, right iliofemoral bypass, and right femoral-tibial bypass. We specifically discussed risk of death, MI, stroke, respiratory failure, renal failure. The patient understood and wished to proceed. ? ?OPERATIVE FINDINGS: Anatomy as expected from CT angiogram, and conventional angiogram. Highly complex repair. Patient tolerated well. Palpable R DP pulse at completion. Palpable L PT pulse at completion. ? ?DESCRIPTION OF PROCEDURE: After identification of the patient in the pre-operative holding area, the patient was transferred to the operating room. The patient was positioned supine on the operating room table. Anesthesia was induced. The abdomen, groins, thighs, and circumferential legs were prepped and draped in standard fashion. A surgical pause was performed confirming correct patient, procedure, and operative location. ? ?Longitudinal incision was made in the right groin.  The incision was carried down subtenons tissue until the femoral sheath was encountered.  The femoral sheath was carefully divided.  A thrombosed large right common femoral artery aneurysm was identified.  The femoral vessels were skeletonized.  The profunda was identified and isolated.  The anterior and posterior division of the profunda were encircled with Silastic Vesseloops.  The superficial femoral artery was chronically occluded and not encircled.  The external iliac artery was thrombosed and not encircled. ? ?A longitudinal incision was made in the mid calf to expose the posterior tibial artery in the mid calf by Dr. Clark. Carlis Abbottincision was carried down through subcutaneous tissue until the fascia of the superficial posterior compartment of  the calf was counted and divided with Bovie electrocautery.  The soleus was divided.  The posterior tibial vascular bundle was identified and skeletonized. ? ?A generous midline laparotomy was made from xiphoid to just above the pubic symphysis.  Incision was carried down through subcutaneous tissue until the anterior rectus fascia was identified.  The anterior rectus fascia was entered in the midline and great care was taken to divide the fascia while protecting the visceral organs.  The peritoneum was then incised sharply and divided in a similar manner.  The abdomen was inspected.  The retroperitoneal was found to be fatty, but the aortic pulse was easily found.  The abdomen was retracted laterally with a Balfour.  The transverse colon was eviscerated and placed on the chest.  The small bowel was eviscerated and placed on the right abdomen.  The bowel was protected with saline moistened towels.  The retroperitoneum was divided taking great care to avoid injury to the duodenum.  The anterior aspect of the aorta was identified and followed cranially to the left renal vein.  The left and right renal arteries were identified.  Circumferential control of the aorta was then achieved and a harkenson clamp slotted into place.  We carried our exposure caudally through the aortic bifurcation.  A large right common and iliac artery aneurysms was identified.  The aneurysm was contained to the common iliac artery.  We identified the right common iliac artery bifurcation and exposed the hypogastric artery in the proximal external iliac artery.  We were able to slot a Satinsky clamp across these vessels.  Attention was turned to the left common iliac artery.  Exposure was carried from the aortic bifurcation to a soft place in the common iliac artery several centimeters distal to its origin. ? ?An anatomic tunnel was created in the right pelvis connecting the external iliac to the common femoral exposures.  This was done with  blunt digital dissection.  We then delivered a umbilical tape through the tunnel.  It was secured over the anterior abdomen for later use. ? ?The patient was systemically heparinized.  Activated clotting time measurements were used throughout the case to monitor anticoagulation.  We were never able to achieve an ACT greater than 250 despite aggressive heparin dosing.  There was no clinical clotting in the surgical beds while we were heparinized. ? ?After careful communication with the anesthesia staff the infrarenal aorta was crossclamped.  Clamps were applied to the left common iliac artery and right hypogastric artery.  The aorta was divided on the anterior aspect from several centimeters distal to the takeoff of the renal arteries to the aortic bifurcation.  We "teed off" our distal targets in the pelvis.  The aorta was transected just below the renal arteries for a proximal target.  A 28 x 11 mm dacryon bifurcated graft was brought on the field.  A felt strip was created.  An end-to-end anastomosis was created between the transected aorta and bifurcated graft main body using continuous running suture of 3-0 Prolene.  The felt strip was incorporated into the repair.  Prior to completion the graft was flushed with saline.  No leak was noted.  The clamp was released on the proximal aorta.  No leak was noted our proximal anastomosis. ? ?Attention was turned to the right hypogastric artery.  The right limb of the bypass graft was sewn end to end to the very distal right common iliac artery.  A flow channel was easily seen into the hypogastric  artery and this blood back nicely.  The anastomosis was completed using continuous running suture of 4-0 Prolene.  Medially prior to completion the graft was flushed and de-aired.  Several repair sutures were needed after completion.  Good Doppler flow was noted in the hypogastric artery after completion.  The graft did lay with undue redundancy despite measuring the graft  before cutting it beveling it.  I resected a several centimeter segment and reanastomosed it using continuous running suture of 4-0 Prolene.  Doppler flow was again confirmed.  The bypass limb laid much better after

## 2021-11-26 NOTE — Anesthesia Procedure Notes (Addendum)
Arterial Line Insertion ?Start/End5/04/2022 7:45 AM, 11/26/2021 8:00 AM ?Performed by: Imagene Riches, CRNA, CRNA ? Patient location: Pre-op. ?Preanesthetic checklist: patient identified, IV checked, site marked, risks and benefits discussed, surgical consent, monitors and equipment checked, pre-op evaluation, timeout performed and anesthesia consent ?Right, radial was placed ?Catheter size: 20 G ?Hand hygiene performed  and maximum sterile barriers used  ?Allen's test indicative of satisfactory collateral circulation ?Attempts: 1 ?Procedure performed without using ultrasound guided technique. ?Following insertion, dressing applied and Biopatch. ?Post procedure assessment: normal ? ?Patient tolerated the procedure well with no immediate complications. ? ? ?

## 2021-11-26 NOTE — H&P (Addendum)
[facility] [place] ?  ?ASSESSMENT / PLAN: ?61 y.o. male with subacute ischemia of the right lower extremity with early tissue loss. Exhaustive preoperative workup has demonstrated highly complex vascular anatomy. To address multilevel occlusive and aneurysmal disease, he will need an aortobiiliac bypass, right iliofemoral bypass, right femoral-tibial bypass.  ? ?I explained the high risks associated with this repair including death, heart attack, stroke, pneumonia, respiratory failure, renal failure. Given his risk of amputation and benefit from revascularization (WIFI 1 / 3 /1 moderate amputation risk, high potential benefit from revascularization) I think it best to accept these risks. He is in agreement.  ? ?Plan to proceed to OR today.  ?  ?CHIEF COMPLAINT: right leg pain ?  ?HISTORY OF PRESENT ILLNESS: ?[name] [name] is a 61 y.o. male with right lower extremity pain and numbness in the right lower extremity for a month. He describes fairly typical claudication-type symptoms over that time frame. He reports development of a right great toe small chronic wound over the past several days. He was sent for a right leg venous duplex when his right common femoral artery was noted to be thrombosed.  ?  ?His right popliteal artery aneurysm has been thrombosed since at least 2015. At that time he had an ABI of 0.74. Most recent ABI in 2018 was 0.63. ABI today is 0 on the right. A CT scan done in 2020 shows an ectatic right common femoral artery aneurysm with mural thrombus.  ?  ?11/26/21: patient returns to preoperative area after exhaustive inpatient workup to plan complex revascularization. No questions. We again reviewed the risks, benefits, and alternatives.  ? ?VASCULAR RISK FACTORS: ?Negative history of stroke / transient ischemic attack. ?Negative history of coronary artery disease.  ?Negative history of diabetes mellitus.  ?Positive history of smoking. + actively smoking. ?Positive  history of hypertension.  ?Negative history of chronic kidney disease.   ?Negative history of chronic obstructive pulmonary disease. ?  ?FUNCTIONAL STATUS: ?ECOG performance status: (0) Fully active, able to carry on all predisease performance without restriction ?Ambulatory status: Ambulatory within the community without limits ?  ?    ?Past Medical History:  ?Diagnosis Date  ? Aneurysm artery, popliteal ([place]) 10/01/2014  ?  Right 1st seen 11/14; thrombosed 11/15  ? 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  Right SFA 05/07/17 while on warfarin INR 2.9  ? 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  Right SFA 05/07/17 while on warfarin INR 2.9  ? Benign essential HTN 01/04/2012  ? 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 Chronic anticoagulation 01/02/2013  ? Dermatofibroma of forearm 01/02/2013  ?  Left side  ? DVT, lower extremity, proximal ([place]) 05/24/2013  ?  Right leg femoral & popliteal 05/24/13  ? Hyperlipidemia, mixed 01/04/2012  ? 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 Primary hypercoagulable state ([place]) 10/01/2014  ? Sinus bradycardia, chronic 01/04/2012  ? Superficial thrombosis of lower extremity 05/02/2012  ?  ?  ?     ?Past Surgical History:  ?Procedure Laterality Date  ? DIRECT LARYNGOSCOPY Left 10/19/2018  ?  Procedure: DIRECT LARYNGOSCOPY;  Surgeon: Leta Baptist, MD;  Location: [facility];  Service: ENT;  Laterality: Left;  ? IR IMAGING GUIDED PORT INSERTION   11/04/2018  ? IR REMOVAL TUN ACCESS W/ PORT W/O FL MOD SED   05/01/2019  ? TONSILLECTOMY Left 10/19/2018  ?  Procedure: BIOPSY OF LEFT  TONSIL;  Surgeon: Leta Baptist, MD;  Location: [facility];  Service: ENT;  Laterality: Left;  ?  ?  ?     ?Family History  ?Problem Relation Age of Onset  ? Stroke Father    ?  ?  ?Social History  ?  ?     ?Socioeconomic History  ? Marital status: Divorced  ?    Spouse name: Not on file  ? Number of children: 2  ? Years of education: Not on file  ?  Highest education level: Not on file  ?Occupational History  ? Not on file  ?Tobacco Use  ? Smoking status: Light Smoker  ?    Packs/day: 0.50  ?    Years: 30.00  ?    Pack years: 15.00  ?    Types: Cigarettes  ?    Last attempt to quit: 10/19/2018  ?    Years since quitting: 3.0  ? Smokeless tobacco: Never  ?Vaping Use  ? Vaping Use: Former  ? Substances: Nicotine, Flavoring  ? Devices: 1 cartridge q2-3 days, he tells me he is not using.   ?Substance and Sexual Activity  ? Alcohol use: Yes  ?    Alcohol/week: 13.0 - 14.0 standard drinks  ?    Types: 12 Cans of beer, 1 - 2 Shots of liquor per week  ?    Comment: 1-2 times per week.  ? Drug use: No  ? Sexual activity: Not on file  ?Other Topics Concern  ? Not on file  ?Social History Narrative  ? Not on file  ?  ?Social Determinants of Health  ?  ?Financial Resource Strain: Not on file  ?Food Insecurity: Not on file  ?Transportation Needs: Not on file  ?Physical Activity: Not on file  ?Stress: Not on file  ?Social Connections: Not on file  ?Intimate Partner Violence: Not on file  ?  ?  ?No Known Allergies ?  ?No current facility-administered medications for this encounter.  ?  ?      ?Current Outpatient Medications  ?Medication Sig Dispense Refill  ? atorvastatin (LIPITOR) 20 MG tablet Take 1 tablet (20 mg total) by mouth daily. 90 tablet 11  ? cyclobenzaprine (FLEXERIL) 10 MG tablet TAKE 1 TABLET BY MOUTH THREE TIMES A DAY AS NEEDED FOR MUSCLE SPASMS 30 tablet 0  ? enoxaparin (LOVENOX) 120 MG/0.8ML injection Inject 0.72 mLs (108 mg total) into the skin every 12 (twelve) hours for 10 days. 15 mL 0  ? gabapentin (NEURONTIN) 100 MG capsule Take 100 mg by mouth every 8 (eight) hours as needed.      ? glycopyrrolate (ROBINUL) 2 MG tablet Take 1 tablet (2 mg total) by mouth 3 (three) times daily as needed (mouth secretions). 30 tablet 0  ? levothyroxine (SYNTHROID) 25 MCG tablet Take 1 tablet (25 mcg total) by mouth daily before breakfast. Take on empty stomach w/ water, 1  hour before other meds or food. 90 tablet 1  ? metoprolol succinate (TOPROL-XL) 25 MG 24 hr tablet TAKE 1 TABLET (25 MG TOTAL) BY MOUTH DAILY. 90 tablet 1  ? ondansetron (ZOFRAN-ODT) 4 MG disintegrating tablet Take 1 tablet (4 mg total) by mouth every 8 (eight) hours as needed for nausea or vomiting. 20 tablet 0  ? oxyCODONE (OXY IR/ROXICODONE) 5 MG immediate release tablet Take 5 mg by mouth every 8 (eight) hours as needed.      ? rivaroxaban (XARELTO) 20 MG TABS tablet Take 1 tablet (20  mg total) by mouth daily with supper. 90 tablet 3  ? senna-docusate (SENOKOT-S) 8.6-50 MG tablet Take 1 tablet by mouth daily. 90 tablet 5  ? sodium fluoride (PREVIDENT 5000 PLUS) 1.1 % CREA dental cream Apply to tooth brush. Brush teeth for 2 minutes. Spit out excess. DO NOT rinse afterwards. Repeat nightly. 1 Tube prn  ? traZODone (DESYREL) 50 MG tablet Take 1 tablet (50 mg total) by mouth at bedtime. 90 tablet 1  ? vitamin E 180 MG (400 UNITS) capsule Take 400 IU daily x 1 week, then 400 IU BID 60 capsule 5  ?  ?  ?PHYSICAL EXAM ?See nursing note for vital signs. ? ?Constitutional: well appearing. no distress. Appears well nourished.  ?Neurologic: CN intact. no focal findings. no sensory loss. ?Psychiatric:  Mood and affect symmetric and appropriate. ?Eyes:  No icterus. No conjunctival pallor. ?Ears, nose, throat:  mucous membranes moist. Midline trachea.  ?Cardiac: regular rate and rhythm.  ?Respiratory:  unlabored. ?Abdominal:  soft, non-tender, non-distended.  ?Peripheral vascular: 2+ L DP. Absent R pedal pulses. Absent R femoral pulse.  ?Extremity: no edema. no cyanosis. + pallor.  ?Skin: no gangrene. Small focus of ulceration in right great toe nail bed.  ?Lymphatic: no Stemmer's sign. no palpable lymphadenopathy. ?  ?PERTINENT LABORATORY AND RADIOLOGIC DATA ?  ?Most recent CBC ?  ?  Latest Ref Rng & Units 09/22/2021  ?  5:40 PM 08/27/2021  ?  2:59 PM 02/21/2021  ?  3:23 PM  ?CBC  ?WBC 4.0 - 10.5 K/uL 7.9   5.3   4.5     ?Hemoglobin 13.0 - 17.0 g/dL 16.2   15.9   16.1    ?Hematocrit 39.0 - 52.0 % 48.7   46.2   45.5    ?Platelets 150 - 400 K/uL 247   155   151    ?  ?  ?Most recent CMP ?  ?  Latest Ref Rng & Units 09/22/2021  ?  5:40 PM 2

## 2021-11-26 NOTE — Anesthesia Procedure Notes (Signed)
Central Venous Catheter Insertion ?Performed by: Oleta Mouse, MD, anesthesiologist ?Start/End5/04/2022 8:48 AM, 11/26/2021 9:04 AM ?Patient location: Pre-op. ?Preanesthetic checklist: patient identified, IV checked, site marked, risks and benefits discussed, surgical consent, monitors and equipment checked, pre-op evaluation, timeout performed and anesthesia consent ?Lidocaine 1% used for infiltration and patient sedated ?Hand hygiene performed  and maximum sterile barriers used  ?Catheter size: 8 Fr ?Total catheter length 16. ?Central line was placed.Double lumen ?Procedure performed using ultrasound guided technique. ?Ultrasound Notes:anatomy identified, needle tip was noted to be adjacent to the nerve/plexus identified, no ultrasound evidence of intravascular and/or intraneural injection and image(s) printed for medical record ?Attempts: 1 ?Following insertion, dressing applied and line sutured. ?Post procedure assessment: blood return through all ports ? ?Patient tolerated the procedure well with no immediate complications. ? ? ? ?

## 2021-11-26 NOTE — Transfer of Care (Signed)
Immediate Anesthesia Transfer of Care Note ? ?Patient: [name] [name] ? ?Procedure(s) Performed: AORTO- BIILIAC ARTERY  BYPASS GRAFT USING HEMASHIELD 20 X 11MM, RIGHT ILIO-FEMORAL BYPASS  UMBILICAL HERNIA REPAIR (Bilateral: Abdomen) ?RIGHT FEMORALTIBIAL  ARTERY BYPASS GRAFT USING PROPATEN GRAFT (Right) ?APPLICATION OF CELL SAVER (Abdomen) ?APPLICATION OF WOUND VAC (Right: Groin) ? ?Patient Location: PACU ? ?Anesthesia Type:General ? ?Level of Consciousness: awake and alert  ? ?Airway & Oxygen Therapy: Patient Spontanous Breathing and Patient connected to nasal cannula oxygen ? ?Post-op Assessment: Report given to RN and Post -op Vital signs reviewed and stable ? ?Post vital signs: Reviewed and stable ? ?Last Vitals:  ?Vitals Value Taken Time  ?BP    ?Temp    ?Pulse 96 11/26/21 1651  ?Resp 19 11/26/21 1651  ?SpO2 96 % 11/26/21 1651  ?Vitals shown include unvalidated device data. ? ?Last Pain:  ?Vitals:  ? 11/26/21 0729  ?PainSc: 0-No pain  ?   ? ?Patients Stated Pain Goal: 0 (11/26/21 0729) ? ?Complications: No notable events documented. ?

## 2021-11-26 NOTE — Op Note (Signed)
DATE OF SERVICE: 11/26/2021 ?  ?PATIENT:  [name] [name]  61 y.o. male ?  ?PRE-OPERATIVE DIAGNOSIS:   ?Small infrarenal abdominal aortic aneurysm (70m) ?Right common iliac artery aneurysm (365m ?Occluded right external iliac artery ?Occluded left hypogastric artery ?Occluded right common femoral artery aneurysm ?Chronically occluded right popliteal artery aneurysm ?  ?POST-OPERATIVE DIAGNOSIS:  Same ?  ?PROCEDURE:   ?1) aorto-bi-iliac bypass (20x1169macron) ?2) right ilio-femoral bypass (8mm74mcron) ?3) right femoral - posterior tibial artery bypass (6mm 80mE) ?4) umbilical hernia repair ?  ?Surgeon: Dr. ChrisMarty Heck? ?Co-Surgeon: Dr. ThomaJamelle Haring? ?INDICATION FOR PROCEDURE: [name] [name] 60 y.57 male with highly complex aneurysmal and occlusive disease (see above for details). He underwent an exhaustive preoperative evaluation. After careful discussion of risks, benefits, and alternatives the patient was offered aorto-bi-iliac bypass, right iliofemoral bypass, and right femoral-tibial bypass. We specifically discussed risk of death, MI, stroke, respiratory failure, renal failure. The patient understood and wished to proceed. ? ?Details: I was present for the entire procedure with my co-surgeon Dr. HawkeStanford Breedexposed the posterior tibial artery for distal bypass through medial approach in the calf.  I then helped Dr. HawkeStanford Breed the abdominal exposure of the infrarenal abdominal aorta as well as exposure of  both iliac arteries including the right hypogastric artery.  I was present for the proximal anastomosis to the infrarenal abdominal aorta as well as the anastomosis to the left common iliac artery and the right common iliac artery.  I was present for the right iliofemoral bypass.  I also was present for the right femoral to posterior tibial bypass with PTFE.  Please see Dr. HawkeMora Applation for the entire procedure in detail.  This was a highly complex case with multiple levels of  aneurysmal and occlusive disease. ? ? ?ChrisMarty Heck?[facility]: [phone]?ChrisMarty Heck?

## 2021-11-26 NOTE — Progress Notes (Signed)
ANTICOAGULATION CONSULT NOTE - Initial Consult ? ?Pharmacy Consult for Heparin ?Indication: atrial fibrillation ? ?No Known Allergies ? ?Patient Measurements: ?Height: '6\' 3"'$  (190.5 cm) ?Weight: 104.3 kg (230 lb) ?IBW/kg (Calculated) : 84.5 ?Heparin Dosing Weight: 104 kg ? ?Vital Signs: ?Temp: 97.5 ?F (36.4 ?C) (05/10 1750) ?BP: 91/75 (05/10 1750) ?Pulse Rate: 83 (05/10 1750) ? ?Labs: ?Recent Labs  ?  11/25/21 ?1459 11/26/21 ?0646 11/26/21 ?1021 11/26/21 ?1516 11/26/21 ?1540 11/26/21 ?1700  ?HGB  --   --    < > 12.9* 13.3 13.3  ?HCT  --   --    < > 38.0* 39.2 39.5  ?PLT  --   --   --   --  240 231  ?APTT  --  30  --   --   --  34  ?LABPROT  --  13.6  --   --   --  14.8  ?INR  --  1.1  --   --   --  1.2  ?CREATININE 1.09  --   --   --   --  1.04  ? < > = values in this interval not displayed.  ? ? ?Estimated Creatinine Clearance: 97.5 mL/min (by C-G formula based on SCr of 1.04 mg/dL). ? ? ?Medical History: ?Past Medical History:  ?Diagnosis Date  ? 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 Right 1st seen 11/14; thrombosed 11/15  ? 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 Right SFA 05/07/17 while on warfarin INR 2.9  ? 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 Right SFA 05/07/17 while on warfarin INR 2.9  ? Atrial fibrillation ([place])   ? Benign essential HTN 01/04/2012  ? Chronic anticoagulation 01/02/2013  ? Dermatofibroma of forearm 01/02/2013  ? Left side  ? DVT, lower extremity, proximal ([place]) 05/24/2013  ? Right leg femoral & popliteal 05/24/13  ? Hyperlipidemia, mixed 01/04/2012  ? Hypothyroidism   ? Pneumonia   ? 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 Primary hypercoagulable state ([place]) 10/01/2014  ? 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 Sinus bradycardia, chronic 01/04/2012  ? Superficial thrombosis of lower extremity 05/02/2012  ? ? ?Medications:  ?Medications Prior to Admission  ?Medication Sig Dispense Refill Last Dose  ?  atorvastatin (LIPITOR) 40 MG tablet Take 1 tablet (40 mg total) by mouth daily. 30 tablet 3 11/25/2021  ? cyclobenzaprine (FLEXERIL) 10 MG tablet TAKE 1 TABLET BY MOUTH THREE TIMES A DAY AS NEEDED FOR MUSCLE SPASMS 30 tablet 0 11/25/2021  ? enoxaparin (LOVENOX) 120 MG/0.8ML injection Inject 0.72 mLs (108 mg total) into the skin every 12 (twelve) hours for 9 days. 16 mL 0 11/24/2021  ? levothyroxine (SYNTHROID) 25 MCG tablet Take 1 tablet (25 mcg total) by mouth daily before breakfast. Take on empty stomach w/ water, 1 hour before other meds or food. 90 tablet 1 11/26/2021  ? metoprolol succinate (TOPROL-XL) 25 MG 24 hr tablet TAKE 1 TABLET (25 MG TOTAL) BY MOUTH DAILY. (Patient taking differently: Take 25 mg by mouth every evening.) 90 tablet 1 11/25/2021  ? ondansetron (ZOFRAN-ODT) 4 MG disintegrating tablet Take 1 tablet (4 mg total) by mouth every 8 (eight) hours as needed for nausea or vomiting. 20 tablet 0 Past Week  ? oxyCODONE (OXY IR/ROXICODONE) 5 MG immediate release tablet Take 1 tablet (5 mg total) by mouth every 6 (six) hours as needed for moderate pain. 30 tablet 0 11/25/2021  ? senna-docusate (SENOKOT-S) 8.6-50  MG tablet Take 1 tablet by mouth daily. (Patient taking differently: Take 1 tablet by mouth daily as needed for moderate constipation.) 90 tablet 5 Past Week  ? traZODone (DESYREL) 50 MG tablet Take 1 tablet (50 mg total) by mouth at bedtime. (Patient taking differently: Take 50 mg by mouth at bedtime as needed for sleep.) 90 tablet 1 11/25/2021  ? vitamin E 180 MG (400 UNITS) capsule Take 400 IU daily x 1 week, then 400 IU BID 60 capsule 5 Past Week  ? aspirin EC 81 MG EC tablet Take 1 tablet (81 mg total) by mouth daily. Swallow whole. 30 tablet 11 11/24/2021  ? atorvastatin (LIPITOR) 20 MG tablet Take 1 tablet (20 mg total) by mouth daily. (Patient not taking: Reported on 11/24/2021) 90 tablet 11 Not Taking  ? glycopyrrolate (ROBINUL) 2 MG tablet Take 1 tablet (2 mg total) by mouth 3 (three) times daily as  needed (mouth secretions). (Patient not taking: Reported on 11/07/2021) 30 tablet 0   ? rivaroxaban (XARELTO) 20 MG TABS tablet Take 1 tablet (20 mg total) by mouth daily with supper. (Patient not taking: Reported on 11/24/2021) 90 tablet 3 Not Taking  ? sodium fluoride (PREVIDENT 5000 PLUS) 1.1 % CREA dental cream Apply to tooth brush. Brush teeth for 2 minutes. Spit out excess. DO NOT rinse afterwards. Repeat nightly. (Patient not taking: Reported on 11/07/2021) 1 Tube prn   ? ? ?Assessment: ?61 y.o. M presents with ischemic RLE - now s/p  bypass. Pt was on Xarelto in the for PAF, h/o recurrent DVTs (bridged with Lovenox pre-surgery). Last dose of Lovenox was 11/24/21 2200. Pharmacy consulted to restart heparin on 5/11.  ? ?Pt currently with ropivacaine epidural - will reassess in a.m. with regards to status of epidural and restart of anticoagulation. ? ?Goal of Therapy:  ?Heparin level 0.3-0.7 units/ml  ?Monitor platelets by anticoagulation protocol: Yes ?  ?Plan:  ?F/u discontinuation of ropivacaine epidural and ability to start anticoagulation ? ?Sherlon Handing, PharmD, BCPS ?Please see amion for complete clinical pharmacist phone list ?11/26/2021,7:55 PM ? ? ?

## 2021-11-27 ENCOUNTER — Encounter (HOSPITAL_COMMUNITY): Payer: Self-pay | Admitting: Vascular Surgery

## 2021-11-27 ENCOUNTER — Inpatient Hospital Stay (HOSPITAL_COMMUNITY): Payer: Medicaid Other

## 2021-11-27 ENCOUNTER — Encounter (HOSPITAL_COMMUNITY): Payer: Medicaid Other

## 2021-11-27 ENCOUNTER — Encounter: Payer: Medicaid Other | Admitting: Vascular Surgery

## 2021-11-27 LAB — COMPREHENSIVE METABOLIC PANEL
ALT: 291 U/L — ABNORMAL HIGH (ref 0–44)
AST: 485 U/L — ABNORMAL HIGH (ref 15–41)
Albumin: 3 g/dL — ABNORMAL LOW (ref 3.5–5.0)
Alkaline Phosphatase: 68 U/L (ref 38–126)
Anion gap: 5 (ref 5–15)
BUN: 12 mg/dL (ref 8–23)
CO2: 26 mmol/L (ref 22–32)
Calcium: 8.7 mg/dL — ABNORMAL LOW (ref 8.9–10.3)
Chloride: 109 mmol/L (ref 98–111)
Creatinine, Ser: 1.01 mg/dL (ref 0.61–1.24)
GFR, Estimated: >60 mL/min (ref >60)
Glucose, Bld: 122 mg/dL — ABNORMAL HIGH (ref 70–99)
Potassium: 3.6 mmol/L (ref 3.5–5.1)
Sodium: 140 mmol/L (ref 135–145)
Total Bilirubin: 0.9 mg/dL (ref 0.3–1.2)
Total Protein: 5.4 g/dL — ABNORMAL LOW (ref 6.5–8.1)

## 2021-11-27 LAB — PREPARE FRESH FROZEN PLASMA: Unit division: 0

## 2021-11-27 LAB — BPAM FFP
Blood Product Expiration Date: 202305152359
Blood Product Expiration Date: 202305152359
ISSUE DATE / TIME: 202305101439
ISSUE DATE / TIME: 202305101439
Unit Type and Rh: 7300
Unit Type and Rh: 7300

## 2021-11-27 LAB — AMYLASE: Amylase: 36 U/L (ref 28–100)

## 2021-11-27 LAB — MAGNESIUM: Magnesium: 1.5 mg/dL — ABNORMAL LOW (ref 1.7–2.4)

## 2021-11-27 LAB — POCT I-STAT 7, (LYTES, BLD GAS, ICA,H+H)
Acid-Base Excess: 3 mmol/L — ABNORMAL HIGH (ref 0.0–2.0)
Bicarbonate: 26.4 mmol/L (ref 20.0–28.0)
Calcium, Ion: 1.2 mmol/L (ref 1.15–1.40)
HCT: 36 % — ABNORMAL LOW (ref 39.0–52.0)
Hemoglobin: 12.2 g/dL — ABNORMAL LOW (ref 13.0–17.0)
O2 Saturation: 93 %
Patient temperature: 98.6
Potassium: 3.6 mmol/L (ref 3.5–5.1)
Sodium: 142 mmol/L (ref 135–145)
TCO2: 27 mmol/L (ref 22–32)
pCO2 arterial: 36.1 mmHg (ref 32–48)
pH, Arterial: 7.473 — ABNORMAL HIGH (ref 7.35–7.45)
pO2, Arterial: 62 mmHg — ABNORMAL LOW (ref 83–108)

## 2021-11-27 LAB — CBC
HCT: 37.7 % — ABNORMAL LOW (ref 39.0–52.0)
Hemoglobin: 12.3 g/dL — ABNORMAL LOW (ref 13.0–17.0)
MCH: 30.7 pg (ref 26.0–34.0)
MCHC: 32.6 g/dL (ref 30.0–36.0)
MCV: 94 fL (ref 80.0–100.0)
Platelets: 274 10*3/uL (ref 150–400)
RBC: 4.01 MIL/uL — ABNORMAL LOW (ref 4.22–5.81)
RDW: 14.6 % (ref 11.5–15.5)
WBC: 12.4 10*3/uL — ABNORMAL HIGH (ref 4.0–10.5)
nRBC: 0 % (ref 0.0–0.2)

## 2021-11-27 MED ORDER — CEFAZOLIN SODIUM-DEXTROSE 1-4 GM/50ML-% IV SOLN
1.0000 g | Freq: Three times a day (TID) | INTRAVENOUS | Status: AC
  Administered 2021-11-27 – 2021-11-28 (×5): 1 g via INTRAVENOUS
  Filled 2021-11-27 (×7): qty 50

## 2021-11-27 MED ORDER — ACETAMINOPHEN 160 MG/5ML PO SOLN
325.0000 mg | ORAL | Status: DC | PRN
  Administered 2021-11-27: 650 mg via ORAL
  Filled 2021-11-27: qty 20.3

## 2021-11-27 MED ORDER — TRAZODONE HCL 50 MG PO TABS
50.0000 mg | ORAL_TABLET | Freq: Every day | ORAL | Status: DC
  Administered 2021-11-27 – 2021-11-28 (×2): 50 mg via ORAL
  Filled 2021-11-27 (×5): qty 1

## 2021-11-27 MED ORDER — ROPIVACAINE HCL 2 MG/ML IJ SOLN
8.0000 mL/h | INTRAMUSCULAR | Status: DC
  Administered 2021-11-27: 8 mL/h via EPIDURAL
  Filled 2021-11-27 (×2): qty 200

## 2021-11-27 MED ORDER — POTASSIUM CHLORIDE 10 MEQ/50ML IV SOLN: 10.0000 meq | INTRAVENOUS | Status: DC

## 2021-11-27 MED ORDER — POTASSIUM CHLORIDE 10 MEQ/50ML IV SOLN
10.0000 meq | INTRAVENOUS | Status: AC
  Administered 2021-11-27: 10 meq via INTRAVENOUS
  Filled 2021-11-27: qty 50

## 2021-11-27 NOTE — Addendum Note (Signed)
Addendum  created 11/27/21 1648 by Myrtie Soman, MD  ? Clinical Note Signed  ?  ?

## 2021-11-27 NOTE — Progress Notes (Addendum)
?Aortic Surgery Progress Note ? ? ? ?11/27/2021 ?6:49 AM ?1 Day Post-Op ? ?Subjective:  says he has some abdominal pain.  RN reports he vomited 2x overnight but feels it was due to the yankauer suction.  Wants milk.   ? ?Afebrile ?HR 60's-90's ?74'Q-595'G systolic ?38% 7FI4PP ? ?Gtts:   ?Neo 71mg/min ? ?Blood products: ?2 units PRBC ?2 units FFP ? ?Vitals:  ? 11/27/21 0545 11/27/21 0600  ?BP:  (!) 97/58  ?Pulse:    ?Resp: (!) 27 18  ?Temp:    ?SpO2: 97% 98%  ? ? ?Physical Exam: ?Cardiac:  mildly tachycardic HR of 111 currently and requiring Neo for support ?Lungs:  non labored on 4LO2NC ?Abdomen:  soft; -flatus but +BS present.  +vomiting x 2 overnight ?Incisions:  laparotomy incision is clean and dry; right groin with Prevena with good seal ?Extremities:  easily palpable left DP pulse; +PT doppler  ?General:  no distress ? ?CBC ?   ?Component Value Date/Time  ? WBC 12.4 (H) 11/27/2021 0415  ? RBC 4.01 (L) 11/27/2021 0415  ? HGB 12.2 (L) 11/27/2021 0417  ? HGB 15.9 08/27/2021 1459  ? HGB 18.7 (H) 10/03/2018 1450  ? HGB 17.8 (H) 05/21/2014 1228  ? HCT 36.0 (L) 11/27/2021 0417  ? HCT 52.2 (H) 10/03/2018 1450  ? HCT 50.9 (H) 05/21/2014 1228  ? PLT 274 11/27/2021 0415  ? PLT 155 08/27/2021 1459  ? PLT 230 10/03/2018 1450  ? MCV 94.0 11/27/2021 0415  ? MCV 89 10/03/2018 1450  ? MCV 90.9 05/21/2014 1228  ? MCH 30.7 11/27/2021 0415  ? MCHC 32.6 11/27/2021 0415  ? RDW 14.6 11/27/2021 0415  ? RDW 14.0 10/03/2018 1450  ? RDW 13.6 05/21/2014 1228  ? LYMPHSABS 1.0 09/22/2021 1740  ? LYMPHSABS 1.1 10/03/2018 1450  ? LYMPHSABS 1.8 05/21/2014 1228  ? MONOABS 0.8 09/22/2021 1740  ? MONOABS 0.6 05/21/2014 1228  ? EOSABS 0.2 09/22/2021 1740  ? EOSABS 0.1 10/03/2018 1450  ? EOSABS 0.2 10/15/2011 0224  ? EOSABS 0.1 03/22/2009 1236  ? BASOSABS 0.0 09/22/2021 1740  ? BASOSABS 0.0 10/03/2018 1450  ? BASOSABS 0.0 05/21/2014 1228  ? ? ?BMET ?   ?Component Value Date/Time  ? NA 142 11/27/2021 0417  ? NA 142 10/03/2018 1450  ? NA 141  05/21/2014 1226  ? K 3.6 11/27/2021 0417  ? K 4.3 05/21/2014 1226  ? CL 109 11/27/2021 0415  ? CL 108 (H) 12/28/2012 1405  ? CO2 26 11/27/2021 0415  ? CO2 28 05/21/2014 1226  ? GLUCOSE 122 (H) 11/27/2021 0415  ? GLUCOSE 101 05/21/2014 1226  ? GLUCOSE 95 12/28/2012 1405  ? BUN 12 11/27/2021 0415  ? BUN 15 10/03/2018 1450  ? BUN 13.2 05/21/2014 1226  ? CREATININE 1.01 11/27/2021 0415  ? CREATININE 0.93 08/27/2021 1459  ? CREATININE 0.89 08/21/2019 1523  ? CREATININE 0.9 05/21/2014 1226  ? CALCIUM 8.7 (L) 11/27/2021 0415  ? CALCIUM 9.3 05/21/2014 1226  ? GFRNONAA >60 11/27/2021 0415  ? GFRNONAA >60 08/27/2021 1459  ? GFRNONAA >60 10/15/2011 0224  ? GFRAA >60 04/15/2020 1447  ? GFRAA >60 10/15/2011 0224  ? ? ?INR ?   ?Component Value Date/Time  ? INR 1.2 11/26/2021 1700  ? INR 2.40 02/04/2015 1345  ? ? ? ?Intake/Output Summary (Last 24 hours) at 11/27/2021 0649 ?Last data filed at 11/27/2021 0600 ?Gross per 24 hour  ?Intake 6741.29 ml  ?Output 4490 ml  ?Net 2251.29 ml  ? ? ? ?Assessment/Plan:  61 y.o. male is s/p  ?1) aorto-bi-iliac bypass (20x249m [place]) ?2) right ilio-femoral bypass (873mDacron) ?3) right femoral - posterior tibial artery bypass (49m64mTFE) ?4) umbilical hernia repair ?1 Day Post-Op ? ?-Vascular:  palpable left DP pulse and right PT doppler signal ?-Cardiac:  requiring neo for support; mildly tachycardic.  Wean neo as tolerated.  Afib.  Will hold on heparin gtt until epidural is discontinued and anesthesia is comfortable starting it.  ?-Pulmonary:  non labored on 4LO2NC ?-Neuro:  in tact ?-Renal:  kidney function looks good this morning  ?-GI:  -flatus; +BS present;  +vomit x 2 overnight but felt to be due to suction.  Ok to have tiny sips of clears.  Npo if continued vomiting.  ?-Incisions:  midline incision looks good; right groin with Prevena with good seal ?-Heme/ID:  acute blood loss anemia-received total of 2 PRBC and 2 FFP. Hgb stable.   ?-General:  overall looks good and he is in no  distress ?OOB with therapy ?-keep in ICU today. ? ? ?SamLeontine LocketA-C ?[facility] ?[phone]/05/2022 ?6:49 AM ? ? ?VASCULAR STAFF ADDENDUM: ?I have independently interviewed and examined the patient. ?I agree with the above.  ? ?Doing well overall POD#1 aorto-bi-iliac, right ilio-femoral, and right femoral-PT bypass ?Abdomen soft, incision healing appropriately. Prevena to Right groin. Calf incision healing appropraitely. ?Brisk R PT doppler signal. Palpable L DP. ? ?CNS: continue epidural for pain control. Hold all anticoagulation while epidural is in place. Mobilize as able with PT / OT. ?CV: low dose neo to support BP with epidural in. No evidence of bleeding on CBC.  ?Pulm: encourage pulmonary hygiene: IS / OOB. ?GI: NPO x sips. ?FEN/GU: Keep foley catheter in place while epidural in place.  ?Heme:  blood counts stable. no need for transfusion.  ?ID: no evidence of infection. Continue periop ancef while epidural in place ?Endo:  BG goal 120-180. ?Prophy: SQH. SCDs. pepcid.  ? ?Keep in ICU while epidural in place ? ?ThoYevonne AlineawStanford BreedD ?[facility] Phone Number: [phone]/05/2022 8:32 AM ? ? ?

## 2021-11-27 NOTE — Evaluation (Signed)
Occupational Therapy Evaluation Patient Details Name: [name] [name] MRN: [id number] DOB: 1960-12-30 Today's Date: 11/27/2021   History of Present Illness Pt is a 61 y.o. male who presented 11/26/21 for aorto-bi-iliac bypass, right ilio-femoral bypass, right femoral-posterior tibial artery bypass, and umbilical hernia repair secondary to subacute ischemia of the R lower extremity with early tissue loss. PMH: popliteal aneurysm, arterial embolus and thrombosis of lower extremity, afib, HTN, HLD, hypothyroidism, sinus bradycardia, primary tonsillar squamous cell carcinoma   Clinical Impression   Patient admitted for the procedures listed above.  PTA, despite R leg and foot circulatory/sensation deficits, the patient continued to walk without an AD, and was Mod I with all ADL/IADL completion.  Patient was able to progress to edge of bed sitting, but with drop in BP, and slight confusion, patient returned to supine.  BP did normalize, and RN in room during the assessment.  OT to continue efforts in the acute setting, with AIR being recommended for post acute aggressive rehab.  Remaining deficits listed below.         Recommendations for follow up therapy are one component of a multi-disciplinary discharge planning process, led by the attending physician.  Recommendations may be updated based on patient status, additional functional criteria and insurance authorization.   Follow Up Recommendations  Acute inpatient rehab (3hours/day)    Assistance Recommended at Discharge Frequent or constant Supervision/Assistance  Patient can return home with the following A lot of help with walking and/or transfers;A lot of help with bathing/dressing/bathroom;Assist for transportation;Assistance with cooking/housework;Help with stairs or ramp for entrance    Functional Status Assessment  Patient has had a recent decline in their functional status and demonstrates the ability to make significant improvements in  function in a reasonable and predictable amount of time.  Equipment Recommendations  BSC/3in1    Recommendations for Other Services Rehab consult     Precautions / Restrictions Precautions Precautions: Fall Precaution Comments: A-line, wound vac, foley cath.  Watch BP and O2 sats Restrictions Weight Bearing Restrictions: No      Mobility Bed Mobility Overal bed mobility: Needs Assistance Bed Mobility: Supine to Sit, Sit to Supine     Supine to sit: Min assist, +2 for physical assistance, HOB elevated Sit to supine: Max assist, +2 for physical assistance   General bed mobility comments: BP dropped and assist to lie down Patient Response: Cooperative  Transfers                   General transfer comment: NT      Balance Overall balance assessment: Needs assistance Sitting-balance support: Feet supported, Bilateral upper extremity supported Sitting balance-Leahy Scale: Poor   Postural control: Posterior lean                                 ADL either performed or assessed with clinical judgement   ADL Overall ADL's : Needs assistance/impaired Eating/Feeding: NPO   Grooming: Wash/dry hands;Wash/dry face;Minimal assistance;Sitting   Upper Body Bathing: Moderate assistance;Sitting   Lower Body Bathing: Maximal assistance;Bed level   Upper Body Dressing : Moderate assistance;Sitting   Lower Body Dressing: Maximal assistance;Bed level                 General ADL Comments: Dangled at bedside, BP drop and returned to supine     Vision Baseline Vision/History: 1 Wears glasses Patient Visual Report: No change from baseline  Perception Perception Perception: Not tested   Praxis Praxis Praxis: Not tested    Pertinent Vitals/Pain Pain Assessment Pain Assessment: Faces Faces Pain Scale: Hurts whole lot Pain Location: abdomen and R leg Pain Descriptors / Indicators: Discomfort, Grimacing, Guarding, Sharp Pain Intervention(s):  Monitored during session     Hand Dominance Right   Extremity/Trunk Assessment Upper Extremity Assessment Upper Extremity Assessment: Overall WFL for tasks assessed;RUE deficits/detail RUE Deficits / Details: limited at a-line at wrist. RUE Sensation: WNL RUE Coordination: WNL   Lower Extremity Assessment Lower Extremity Assessment: Defer to PT evaluation   Cervical / Trunk Assessment Cervical / Trunk Assessment: Normal   Communication Communication Communication: No difficulties   Cognition Arousal/Alertness: Awake/alert Behavior During Therapy: Flat affect Overall Cognitive Status: Within Functional Limits for tasks assessed                                 General Comments: Patient understands his situation, is following commands, but, at times had difficulty with complex though.  Patient with drop in BP and lethargic.  OT to continue to test cognition.                      Home Living Family/patient expects to be discharged to:: Private residence Living Arrangements: Spouse/significant other Available Help at Discharge: Family;Available 24 hours/day Type of Home: House Home Access: Stairs to enter [employer] of Steps: 4 Entrance Stairs-Rails: Can reach both Home Layout: Two level Alternate Level Stairs-Number of Steps: full flight.  Patient can stay downstairs, but shower/tub is up staris.  Half bath on main level.   Bathroom Shower/Tub: Theme park manager: Yes How Accessible: Accessible via walker Home Equipment: None   Additional Comments: Has long handled sponge      Prior Functioning/Environment Prior Level of Function : Independent/Modified Independent;Driving               ADLs Comments: Patient states he was continuing to drive despite poor circulation and sensation to R foot.  Participated with IADL, and able to bathe and dress himself.        OT Problem List:  Decreased strength;Decreased range of motion;Decreased activity tolerance;Impaired balance (sitting and/or standing);Decreased knowledge of precautions;Decreased knowledge of use of DME or AE;Decreased safety awareness;Pain      OT Treatment/Interventions: Self-care/ADL training;Therapeutic exercise;Therapeutic activities;Energy conservation;DME and/or AE instruction;Patient/family education;Balance training    OT Goals(Current goals can be found in the care plan section) Acute Rehab OT Goals Patient Stated Goal: Get stronger and return home OT Goal Formulation: With patient Time For Goal Achievement: 12/11/21 Potential to Achieve Goals: Good  OT Frequency: Min 2X/week    Co-evaluation PT/OT/SLP Co-Evaluation/Treatment: Yes Reason for Co-Treatment: Complexity of the patient's impairments (multi-system involvement);For patient/therapist safety;To address functional/ADL transfers   OT goals addressed during session: ADL's and self-care      AM-PAC OT "6 Clicks" Daily Activity     Outcome Measure Help from another person eating meals?: Total Help from another person taking care of personal grooming?: A Little Help from another person toileting, which includes using toliet, bedpan, or urinal?: A Lot Help from another person bathing (including washing, rinsing, drying)?: A Lot Help from another person to put on and taking off regular upper body clothing?: A Lot Help from another person to put on and taking off regular lower body clothing?: A Lot 6 Click Score: 12  End of Session Equipment Utilized During Treatment: Oxygen Nurse Communication: Mobility status  Activity Tolerance: Other (comment) (drop in BP) Patient left: in bed;with call bell/phone within reach;with nursing/sitter in room  OT Visit Diagnosis: Unsteadiness on feet (R26.81);Muscle weakness (generalized) (M62.81);Pain;Dizziness and giddiness (R42) Pain - Right/Left: Right Pain - part of body: Leg                Time:  1610-9604 OT Time Calculation (min): 21 min Charges:  OT General Charges $OT Visit: 1 Visit OT Evaluation $OT Eval Moderate Complexity: 1 Mod  11/27/2021  RP, OTR/L  Acute Rehabilitation Services  Office:  [phone]   Suzanna Obey 11/27/2021, 1:17 PM

## 2021-11-27 NOTE — Anesthesia Post-op Follow-up Note (Signed)
?  Anesthesia Pain Follow-up Note ? ?Patient: [name] [name] ? ?Day #: 1 ?Temp level to T11 bilaterally ? ?Date of Follow-up: 11/27/2021 Time: 4:47 PM ? ?Last Vitals:  ?Vitals:  ? 11/27/21 1245 11/27/21 1300  ?BP:  102/71  ?Pulse:    ?Resp: 20 (!) 23  ?Temp:    ?SpO2: 99% 98%  ? ? ?Level of Consciousness: lethargic ? ?Pain: mild  ? ?Side Effects:None ? ?Catheter Site Exam:clean ? ?Epidural / Intrathecal (From admission, onward)  ? Start     Dose/Rate Route Frequency Ordered Stop  ? 11/27/21 0300  ropivacaine (PF) 2 mg/mL (0.2%) (NAROPIN) injection       ? 8 mL/hr ?8 mL/hr  Epidural Continuous 11/27/21 0202    ?  ? ? ? ?Plan: Continue current therapy of postop epidural at surgeon's request ? ?Henretta Quist S ? ? ? ?

## 2021-11-27 NOTE — Evaluation (Signed)
Physical Therapy Evaluation ?Patient Details ?Name: [name] [name] ?MRN: [id number] ?DOB: 1961/07/17 ?Today's Date: 11/27/2021 ? ?History of Present Illness ? Pt is a 61 y.o. male who presented 11/26/21 for aorto-bi-iliac bypass, right ilio-femoral bypass, right femoral-posterior tibial artery bypass, and umbilical hernia repair secondary to subacute ischemia of the R lower extremity with early tissue loss. 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  ?Clinical Impression ? Pt presents with condition above and deficits mentioned below, see PT Problem List. PTA, he was IND without DME, living with his wife in a 2-level house with 4 STE. Pt reports he can stay on the main level. Currently, pt limited by a decrease in BP as he progressed to a more upright position. His BP decreased to 75/57 sitting EOB with pt notably more lethargic. The BP improved to 105/74 once supine again end of session. For pt safety, limited session to just sitting EOB. Pt requiring minA to transition supine > sit with HOB elevated but mod-maxA for sitting balance and maxAx2 to return to supine due to his drop in BP. He presents with R lower extremity edema, functional weakness, and sensation deficits inferior to the knee (absent sensation in foot). These factors along with the pain and low BP will likely impact his balance and activity tolerance. At this time, pt would greatly benefit from intensive therapy in the AIR setting to maximize his return to baseline. Will continue to follow acutely. ?   ? ?Recommendations for follow up therapy are one component of a multi-disciplinary discharge planning process, led by the attending physician.  Recommendations may be updated based on patient status, additional functional criteria and insurance authorization. ? ?Follow Up Recommendations Acute inpatient rehab (3hours/day) ? ?   ?Assistance Recommended at Discharge Frequent or constant Supervision/Assistance  ?Patient can return home with the following ? Two people to help with walking and/or transfers;A lot of help with bathing/dressing/bathroom;Assistance with cooking/housework;Assist for transportation;Help with stairs or ramp for entrance ? ?  ?Equipment Recommendations Other (comment) (TBA)  ?Recommendations for Other Services ? Rehab consult  ?  ?Functional Status Assessment Patient has had a recent decline in their functional status and demonstrates the ability to make significant improvements in function in a reasonable and predictable amount of time.  ? ?  ?Precautions / Restrictions Precautions ?Precautions: Fall ?Precaution Comments: A-line, wound vac, epidural, foley cath, Watch BP and O2 sats ?Restrictions ?Weight Bearing Restrictions: No  ? ?  ? ?Mobility ? Bed Mobility ?Overal bed mobility: Needs Assistance ?Bed Mobility: Supine to Sit, Sit to Supine ?  ?  ?Supine to sit: Min assist, HOB elevated, +2 for safety/equipment ?Sit to supine: Max assist, +2 for physical assistance ?  ?General bed mobility comments: Cues provided to manage legs to R EOB, minA to complete leg management. MinA for L UE to pull trunk up to sit and scoot to edge. BP decreasing as pt sat EOB and pt became more lethargic, thus returned to supine for pt safety with maxAx2. ?  ? ?Transfers ?  ?  ?  ?  ?  ?  ?  ?  ?  ?General transfer comment: NT due to drop in BP ?  ? ?Ambulation/Gait ?  ?  ?  ?  ?  ?  ?  ?General Gait Details: NT due to drop in BP ? ?Stairs ?  ?  ?  ?  ?  ? ?Wheelchair  Mobility ?  ? ?Modified Rankin (Stroke Patients Only) ?  ? ?  ? ?Balance Overall balance assessment: Needs assistance ?Sitting-balance support: Feet supported, Bilateral upper extremity supported ?Sitting balance-Leahy Scale: Poor ?Sitting balance - Comments: Pt with posterior lean as he quickly became lightheaded and lethargic with BP decreasing sitting EOB, needing  mod-maxA majority of time. ?Postural control: Posterior lean ?  ?  ?Standing balance comment: NT due to drop in BP ?  ?  ?  ?  ?  ?  ?  ?  ?  ?  ?  ?   ? ? ? ?Pertinent Vitals/Pain Pain Assessment ?Pain Assessment: Faces ?Faces Pain Scale: Hurts whole lot ?Pain Location: abdomen and R leg ?Pain Descriptors / Indicators: Discomfort, Grimacing, Guarding, Sharp ?Pain Intervention(s): Limited activity within patient's tolerance, Monitored during session, Repositioned  ? ? ?Home Living Family/patient expects to be discharged to:: Private residence ?Living Arrangements: Spouse/significant other ?Available Help at Discharge: Family;Available 24 hours/day ?Type of Home: House ?Home Access: Stairs to enter ?Entrance Stairs-Rails: Can reach both ?Entrance Stairs-Number of Steps: 4 ?Alternate Level Stairs-Number of Steps: full flight.  Patient can stay downstairs, but shower/tub is up stairs.  Half bath on main level. ?Home Layout: Two level ?Home Equipment: None ?Additional Comments: Has long handled sponge  ?  ?Prior Function Prior Level of Function : Independent/Modified Independent;Driving ?  ?  ?  ?  ?  ?  ?Mobility Comments: No AD. Denies any recent falls. ?ADLs Comments: Patient states he was continuing to drive despite poor circulation and sensation to R foot.  Participated with IADL, and able to bathe and dress himself. ?  ? ? ?Hand Dominance  ? Dominant Hand: Right ? ?  ?Extremity/Trunk Assessment  ? Upper Extremity Assessment ?Upper Extremity Assessment: Defer to OT evaluation ?RUE Deficits / Details: limited at a-line at wrist. ?RUE Sensation: WNL ?RUE Coordination: WNL ?  ? ?Lower Extremity Assessment ?Lower Extremity Assessment: RLE deficits/detail ?RLE Deficits / Details: Detects intact sensation at knee, decreased sensation distally towards ankle, no sensation to touch at foot; actively moves ankle, knee, and hip; edema noted ?RLE Sensation: decreased light touch ?  ? ?Cervical / Trunk Assessment ?Cervical /  Trunk Assessment: Normal  ?Communication  ? Communication: No difficulties  ?Cognition Arousal/Alertness: Awake/alert ?Behavior During Therapy: Flat affect ?Overall Cognitive Status: Within Functional Limits for tasks assessed ?  ?  ?  ?  ?  ?  ?  ?  ?  ?  ?  ?  ?  ?  ?  ?  ?General Comments: Patient understands his situation, is following commands, but, at times had difficulty with complex thought.  Patient with drop in BP and lethargic. May need to assess cognition further in future sessions. ?  ?  ? ?  ?General Comments General comments (skin integrity, edema, etc.): BP decreasing to 75/57 sitting EOB but improved to 105/74 once supine end of session ? ?  ?Exercises    ? ?Assessment/Plan  ?  ?PT Assessment Patient needs continued PT services  ?PT Problem List Decreased strength;Decreased activity tolerance;Decreased balance;Decreased mobility;Decreased knowledge of use of DME;Cardiopulmonary status limiting activity;Impaired sensation;Pain ? ?   ?  ?PT Treatment Interventions DME instruction;Gait training;Stair training;Therapeutic activities;Functional mobility training;Therapeutic exercise;Balance training;Neuromuscular re-education;Patient/family education   ? ?PT Goals (Current goals can be found in the Care Plan section)  ?Acute Rehab PT Goals ?Patient Stated Goal: to improve ?PT Goal Formulation: With patient ?Time For Goal Achievement: 12/11/21 ?Potential to Achieve Goals: Good ? ?  ?  Frequency Min 3X/week ?  ? ? ?Co-evaluation PT/OT/SLP Co-Evaluation/Treatment: Yes ?Reason for Co-Treatment: Complexity of the patient's impairments (multi-system involvement);For patient/therapist safety;To address functional/ADL transfers ?PT goals addressed during session: Mobility/safety with mobility;Balance ?OT goals addressed during session: ADL's and self-care ?  ? ? ?  ?AM-PAC PT "6 Clicks" Mobility  ?Outcome Measure Help needed turning from your back to your side while in a flat bed without using bedrails?: A  Lot ?Help needed moving from lying on your back to sitting on the side of a flat bed without using bedrails?: A Lot ?Help needed moving to and from a bed to a chair (including a wheelchair)?: Total ?Help needed standin

## 2021-11-27 NOTE — Anesthesia Postprocedure Evaluation (Signed)
Anesthesia Post Note ? ?Patient: [name] [name] ? ?Procedure(s) Performed: AORTO- BIILIAC ARTERY  BYPASS GRAFT USING HEMASHIELD 20 X 11MM, RIGHT ILIO-FEMORAL BYPASS  UMBILICAL HERNIA REPAIR (Bilateral: Abdomen) ?RIGHT FEMORALTIBIAL  ARTERY BYPASS GRAFT USING PROPATEN GRAFT (Right) ?APPLICATION OF CELL SAVER (Abdomen) ?APPLICATION OF WOUND VAC (Right: Groin) ? ?  ? ?Patient location during evaluation: PACU ?Anesthesia Type: General ?Level of consciousness: awake and alert ?Pain management: pain level controlled ?Vital Signs Assessment: post-procedure vital signs reviewed and stable ?Respiratory status: spontaneous breathing, nonlabored ventilation and respiratory function stable ?Cardiovascular status: blood pressure returned to baseline and stable ?Postop Assessment: no apparent nausea or vomiting ?Anesthetic complications: no ? ? ?No notable events documented. ? ?Last Vitals:  ?Vitals:  ? 11/27/21 0700 11/27/21 0827  ?BP: 106/78   ?Pulse:    ?Resp: 17 18  ?Temp: 36.7 ?C   ?SpO2: 98% 97%  ?  ?Last Pain:  ?Vitals:  ? 11/27/21 0827  ?TempSrc:   ?PainSc: 4   ? ? ?  ?  ?  ?  ?  ?  ? ?Lidia Collum ? ? ? ? ?

## 2021-11-27 NOTE — Progress Notes (Signed)
Seen on afternoon rounds. 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 ? ?Unchanged exam, stable vital signs, soft abdomen, incisions healing appropriately, brisk Doppler flow in the posterior tibial on the right. ? ?We will continue epidural catheter overnight tonight.  Plan to remove this tomorrow.  Please hold all anticoagulation until epidural catheter is removed.  We will plan to transition to PCA once epidural catheter is removed.  Okay for sips of clears.  Await return of bowel function.  Mobilize as able with PT/OT.  Noted likely disposition to inpatient rehab. ? ?Yevonne Aline. [name], MD ?[facility] ?Office Phone Number: [phone] ?11/27/2021 3:40 PM ? ?

## 2021-11-28 LAB — BASIC METABOLIC PANEL
Anion gap: 6 (ref 5–15)
BUN: 9 mg/dL (ref 8–23)
CO2: 25 mmol/L (ref 22–32)
Calcium: 8.2 mg/dL — ABNORMAL LOW (ref 8.9–10.3)
Chloride: 107 mmol/L (ref 98–111)
Creatinine, Ser: 0.94 mg/dL (ref 0.61–1.24)
GFR, Estimated: >60 mL/min (ref >60)
Glucose, Bld: 109 mg/dL — ABNORMAL HIGH (ref 70–99)
Potassium: 3.6 mmol/L (ref 3.5–5.1)
Sodium: 138 mmol/L (ref 135–145)

## 2021-11-28 LAB — CBC
HCT: 33.8 % — ABNORMAL LOW (ref 39.0–52.0)
Hemoglobin: 11.1 g/dL — ABNORMAL LOW (ref 13.0–17.0)
MCH: 31.4 pg (ref 26.0–34.0)
MCHC: 32.8 g/dL (ref 30.0–36.0)
MCV: 95.8 fL (ref 80.0–100.0)
Platelets: 166 10*3/uL (ref 150–400)
RBC: 3.53 MIL/uL — ABNORMAL LOW (ref 4.22–5.81)
RDW: 14.9 % (ref 11.5–15.5)
WBC: 10.6 10*3/uL — ABNORMAL HIGH (ref 4.0–10.5)
nRBC: 0 % (ref 0.0–0.2)

## 2021-11-28 LAB — MAGNESIUM: Magnesium: 2 mg/dL (ref 1.7–2.4)

## 2021-11-28 LAB — GLUCOSE, CAPILLARY
Glucose-Capillary: 108 mg/dL — ABNORMAL HIGH (ref 70–99)
Glucose-Capillary: 100 mg/dL — ABNORMAL HIGH (ref 70–99)

## 2021-11-28 MED ORDER — ASPIRIN EC 81 MG PO TBEC
81.0000 mg | DELAYED_RELEASE_TABLET | Freq: Every day | ORAL | Status: DC
  Administered 2021-11-28 – 2021-12-02 (×4): 81 mg via ORAL
  Filled 2021-11-28 (×6): qty 1

## 2021-11-28 MED ORDER — LEVOTHYROXINE SODIUM 25 MCG PO TABS
25.0000 ug | ORAL_TABLET | Freq: Every day | ORAL | Status: DC
  Administered 2021-11-28 – 2021-12-03 (×6): 25 ug via ORAL
  Filled 2021-11-28 (×6): qty 1

## 2021-11-28 MED ORDER — HYDROMORPHONE 1 MG/ML IV SOLN
INTRAVENOUS | Status: DC
  Administered 2021-11-29: 1.5 mg via INTRAVENOUS
  Administered 2021-11-29: 2.4 mg via INTRAVENOUS
  Administered 2021-11-29: 0.6 mg via INTRAVENOUS
  Administered 2021-11-29: 2.4 mg via INTRAVENOUS
  Administered 2021-11-30: 1.8 mg via INTRAVENOUS
  Administered 2021-11-30: 0.9 mg via INTRAVENOUS
  Administered 2021-11-30: 1.5 mg via INTRAVENOUS
  Administered 2021-11-30: 1.2 mg via INTRAVENOUS
  Administered 2021-11-30: 1.8 mg via INTRAVENOUS
  Administered 2021-11-30: 1.2 mg via INTRAVENOUS
  Administered 2021-11-30: 0.3 mg via INTRAVENOUS
  Administered 2021-12-01: 2.1 mg via INTRAVENOUS
  Administered 2021-12-01: 1.2 mg via INTRAVENOUS
  Filled 2021-11-28 (×2): qty 30

## 2021-11-28 MED ORDER — ATORVASTATIN CALCIUM 10 MG PO TABS
20.0000 mg | ORAL_TABLET | Freq: Every day | ORAL | Status: DC
  Administered 2021-11-28 – 2021-12-02 (×5): 20 mg via ORAL
  Filled 2021-11-28 (×6): qty 2

## 2021-11-28 MED ORDER — NALOXONE HCL 0.4 MG/ML IJ SOLN: 0.4000 mg | INTRAMUSCULAR | Status: DC | PRN

## 2021-11-28 MED ORDER — POTASSIUM CHLORIDE CRYS ER 20 MEQ PO TBCR
20.0000 meq | EXTENDED_RELEASE_TABLET | Freq: Once | ORAL | Status: AC
  Administered 2021-11-28: 20 meq via ORAL
  Filled 2021-11-28: qty 1

## 2021-11-28 MED ORDER — METOPROLOL TARTRATE 5 MG/5ML IV SOLN
5.0000 mg | Freq: Four times a day (QID) | INTRAVENOUS | Status: DC
Start: 2021-11-28 — End: 2021-11-29
  Administered 2021-11-28 – 2021-11-29 (×2): 5 mg via INTRAVENOUS
  Filled 2021-11-28 (×2): qty 5

## 2021-11-28 MED ORDER — ONDANSETRON HCL 4 MG/2ML IJ SOLN: 4.0000 mg | Freq: Four times a day (QID) | INTRAMUSCULAR | Status: DC | PRN

## 2021-11-28 MED ORDER — SODIUM CHLORIDE 0.9% FLUSH: 9.0000 mL | INTRAVENOUS | Status: DC | PRN

## 2021-11-28 MED ORDER — DIPHENHYDRAMINE HCL 50 MG/ML IJ SOLN: 12.5000 mg | Freq: Four times a day (QID) | INTRAMUSCULAR | Status: DC | PRN

## 2021-11-28 MED ORDER — HEPARIN (PORCINE) 25000 UT/250ML-% IV SOLN
1500.0000 [IU]/h | INTRAVENOUS | Status: DC
Start: 2021-11-28 — End: 2021-11-28

## 2021-11-28 MED ORDER — DIPHENHYDRAMINE HCL 12.5 MG/5ML PO ELIX
12.5000 mg | ORAL_SOLUTION | Freq: Four times a day (QID) | ORAL | Status: DC | PRN
  Administered 2021-11-28: 12.5 mg via ORAL
  Filled 2021-11-28 (×3): qty 5

## 2021-11-28 MED ORDER — HEPARIN (PORCINE) 25000 UT/250ML-% IV SOLN
2450.0000 [IU]/h | INTRAVENOUS | Status: DC
  Administered 2021-11-29: 1500 [IU]/h via INTRAVENOUS
  Administered 2021-11-30: 2300 [IU]/h via INTRAVENOUS
  Filled 2021-11-28 (×5): qty 250

## 2021-11-28 MED FILL — Heparin Sodium (Porcine) Inj 1000 Unit/ML: INTRAMUSCULAR | Qty: 30 | Status: AC

## 2021-11-28 MED FILL — Sodium Chloride IV Soln 0.9%: INTRAVENOUS | Qty: 1000 | Status: AC

## 2021-11-28 MED FILL — Sodium Chloride Irrigation Soln 0.9%: Qty: 3000 | Status: AC

## 2021-11-28 NOTE — Progress Notes (Addendum)
?Aortic Surgery Progress Note ? ? ? ?11/28/2021 ?6:40 AM ?2 Days Post-Op ? ?Subjective:  says he has passed a little gas.  Sat on the side of the bed yesterday.  Wants more apple juice.  Denies any nausea since yesterday.  Says the nausea was from his sinus drainage.  ? ?Afebrile ?HR 80's-120's afib ?22'L-798'X systolic ?21% 1HE1DE ? ?Gtts:  neo has been weaned off ? ?Blood products: ?2 units PRBC ?2 units FFP ? ?Vitals:  ? 11/28/21 0400 11/28/21 0600  ?BP: 98/63 100/76  ?Pulse:    ?Resp: 20 15  ?Temp: 97.6 ?F (36.4 ?C)   ?SpO2: 97% 95%  ? ? ?Physical Exam: ?Cardiac:  irregular ?Lungs:  non labored on 2LO2NC ?Abdomen:  soft, NT/ND; +flatus ?Incisions:  midline incision looks good; right groin with Prevena with good seal; right BK incision healing nicely as well ?Extremities:  palpable left DP pulse; brisk right PT doppler signal & faint right peroneal doppler signal ?General:  no distress; resting comfortably ? ?CBC ?   ?Component Value Date/Time  ? WBC 10.6 (H) 11/28/2021 0345  ? RBC 3.53 (L) 11/28/2021 0345  ? HGB 11.1 (L) 11/28/2021 0345  ? HGB 15.9 08/27/2021 1459  ? HGB 18.7 (H) 10/03/2018 1450  ? HGB 17.8 (H) 05/21/2014 1228  ? HCT 33.8 (L) 11/28/2021 0345  ? HCT 52.2 (H) 10/03/2018 1450  ? HCT 50.9 (H) 05/21/2014 1228  ? PLT 166 11/28/2021 0345  ? PLT 155 08/27/2021 1459  ? PLT 230 10/03/2018 1450  ? MCV 95.8 11/28/2021 0345  ? MCV 89 10/03/2018 1450  ? MCV 90.9 05/21/2014 1228  ? MCH 31.4 11/28/2021 0345  ? MCHC 32.8 11/28/2021 0345  ? RDW 14.9 11/28/2021 0345  ? RDW 14.0 10/03/2018 1450  ? RDW 13.6 05/21/2014 1228  ? LYMPHSABS 1.0 09/22/2021 1740  ? LYMPHSABS 1.1 10/03/2018 1450  ? LYMPHSABS 1.8 05/21/2014 1228  ? MONOABS 0.8 09/22/2021 1740  ? MONOABS 0.6 05/21/2014 1228  ? EOSABS 0.2 09/22/2021 1740  ? EOSABS 0.1 10/03/2018 1450  ? EOSABS 0.2 10/15/2011 0224  ? EOSABS 0.1 03/22/2009 1236  ? BASOSABS 0.0 09/22/2021 1740  ? BASOSABS 0.0 10/03/2018 1450  ? BASOSABS 0.0 05/21/2014 1228  ? ? ?BMET ?    ?Component Value Date/Time  ? NA 138 11/28/2021 0345  ? NA 142 10/03/2018 1450  ? NA 141 05/21/2014 1226  ? K 3.6 11/28/2021 0345  ? K 4.3 05/21/2014 1226  ? CL 107 11/28/2021 0345  ? CL 108 (H) 12/28/2012 1405  ? CO2 25 11/28/2021 0345  ? CO2 28 05/21/2014 1226  ? GLUCOSE 109 (H) 11/28/2021 0345  ? GLUCOSE 101 05/21/2014 1226  ? GLUCOSE 95 12/28/2012 1405  ? BUN 9 11/28/2021 0345  ? BUN 15 10/03/2018 1450  ? BUN 13.2 05/21/2014 1226  ? CREATININE 0.94 11/28/2021 0345  ? CREATININE 0.93 08/27/2021 1459  ? CREATININE 0.89 08/21/2019 1523  ? CREATININE 0.9 05/21/2014 1226  ? CALCIUM 8.2 (L) 11/28/2021 0345  ? CALCIUM 9.3 05/21/2014 1226  ? GFRNONAA >60 11/28/2021 0345  ? GFRNONAA >60 08/27/2021 1459  ? GFRNONAA >60 10/15/2011 0224  ? GFRAA >60 04/15/2020 1447  ? GFRAA >60 10/15/2011 0224  ? ? ?INR ?   ?Component Value Date/Time  ? INR 1.2 11/26/2021 1700  ? INR 2.40 02/04/2015 1345  ? ? ? ?Intake/Output Summary (Last 24 hours) at 11/28/2021 0640 ?Last data filed at 11/28/2021 0400 ?Gross per 24 hour  ?Intake 2615.83 ml  ?Output  1070 ml  ?Net 1545.83 ml  ? ? ? ?Assessment/Plan:  61 y.o. male is s/p  ?1) aorto-bi-iliac bypass (20x57m Dacron) ?2) right ilio-femoral bypass (8559mDacron) ?3) right femoral - posterior tibial artery bypass (59m19mTFE) ?4) umbilical hernia repair ?2 Days Post-Op ? ?-Vascular:  palpable left DP pulse and brisk right PT doppler signal ?-Cardiac:  hemodynamically stable and off neo currently ?-Pulmonary:  non labored on 2LO2NC;  encouraged him to continue working on his IS ?-Neuro:  in tact ?-Renal:  kidney function continues to look good.  ?-GI:  abdomen is soft, NT/ND; occasional flatus.  Tolerating clear liquids without N/V ?-Incisions:  all incisions are healing nicely.  Right groin with Prevena with good seal ?-Heme/ID:  acute blood loss anemia-hgb 11.1 this am down from 12.2-most likely dilutional. Leukocytosis improved today. ?-General:  resting comfortably in no distress. ? ?-hopeful to  d/c epidural soon and start po pain medication.  Will start heparin gtt when ok with anesthesia ?-will dc foley today ?-mobilize as able with epidural with PT/RN ? ? ?SamLeontine LocketA-C ?[facility] ?[phone]/06/2022 ?6:40 AM ? ?VASCULAR STAFF ADDENDUM: ?I have independently interviewed and examined the patient. ?I agree with the above.  ?Continues to do well. ?Will start clears today. ?Will ask anesthesia to remove epidural catheter. ?Start anticoagulation with heparin as soon as safe from an epidural standpoint. ?Mobilize with PT / OT. ?May be ready for 4E tomorrow. ? ?ThoYevonne AlineawStanford BreedD ?[facility] Phone Number: ([phone]/06/2022 10:05 AM ? ? ?

## 2021-11-28 NOTE — Progress Notes (Signed)
Inpatient Rehab Admissions Coordinator:  ? ?Per PT/OT recommendations pt was screened for CIR by Shann Medal, PT, DPT.  At this time, note limited by blood pressure, but he is progressing with bed mobility.  I will place an order for rehab consult so we can start to work up but pt will need to be able to tolerate OOB at least 1 hr to be considered.  ? ?Shann Medal, PT, DPT ?Admissions Coordinator ?[phone] ?11/28/21  ?2:32 PM ? ?

## 2021-11-28 NOTE — Progress Notes (Signed)
Physical Therapy Treatment ?Patient Details ?Name: [name] [name] ?MRN: [id number] ?DOB: 22-Jun-1961 ?Today's Date: 11/28/2021 ? ? ?History of Present Illness Pt is a 61 y.o. male who presented 11/26/21 for aorto-bi-iliac bypass, right ilio-femoral bypass, right femoral-posterior tibial artery bypass, and umbilical hernia repair secondary to subacute ischemia of the R lower extremity with early tissue loss. 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 ?  ?PT Comments  ? ? Pt is making good, steady progress with mobility. Pt's BP did initially decrease when changing positions, but it increased and stabilized with increased time in each position this session. He denied any lightheadedness. Pt was able to progress to standing and performing lateral weight shifting at EOB with UE support and min-modA. He benefited from placing the bed in egress position to reduce his pain and allow him to advance to standing today. Encouraged pt to progress to ambulating next session. Will continue to follow acutely. Current recommendations remain appropriate. ? ?  ?Recommendations for follow up therapy are one component of a multi-disciplinary discharge planning process, led by the attending physician.  Recommendations may be updated based on patient status, additional functional criteria and insurance authorization. ? ?Follow Up Recommendations ? Acute inpatient rehab (3hours/day) ?  ?  ?Assistance Recommended at Discharge Frequent or constant Supervision/Assistance  ?Patient can return home with the following Two people to help with walking and/or transfers;A lot of help with bathing/dressing/bathroom;Assistance with cooking/housework;Assist for transportation;Help with stairs or ramp for entrance ?  ?Equipment Recommendations ? Rolling walker (2 wheels);BSC/3in1;Hospital bed (pending progress)  ?   ?Recommendations for Other Services   ? ? ?  ?Precautions / Restrictions Precautions ?Precautions: Fall ?Precaution Comments: A-line, wound vac, foley cath, Watch BP and O2 sats ?Restrictions ?Weight Bearing Restrictions: No  ?  ? ?Mobility ? Bed Mobility ?Overal bed mobility: Needs Assistance ?Bed Mobility: Supine to Sit ?  ?  ?Supine to sit: HOB elevated, Mod assist ?  ?  ?General bed mobility comments: Bed in egress position, needing modA to scoot hips anteriorly to lift trunk up to sit, Min guard to scoot posteriorly in bed. ?  ? ?Transfers ?Overall transfer level: Needs assistance ?Equipment used: 1 person hand held assist ?Transfers: Sit to/from Stand ?Sit to Stand: Mod assist ?  ?  ?  ?  ?  ?General transfer comment: Pt cued to push up off bed with L UE and place R UE on PT anterior to him, coming to stand from egress position. ModA to power up to stand and steady with trunk sway. Pt maintaining flexed posture but eventually extending trunk to look superiorly and transitioning hands from PT to RW for support and lateral wt shifting. ?  ? ?Ambulation/Gait ?  ?  ?  ?  ?  ?  ?Pre-gait activities: Lateral weight shifting standing at EOB with RW and minA ?  ? ? ?Stairs ?  ?  ?  ?  ?  ? ? ?Wheelchair Mobility ?  ? ?Modified Rankin (Stroke Patients Only) ?  ? ? ?  ?Balance Overall balance assessment: Needs assistance ?Sitting-balance support: Feet supported, Single extremity supported, Bilateral upper extremity supported ?Sitting balance-Leahy Scale: Poor ?Sitting balance - Comments: Reliant on UE support ?  ?Standing balance support: Bilateral upper extremity supported ?Standing balance-Leahy Scale: Poor ?Standing balance comment: Reliant on UE support and min-modA ?  ?  ?  ?  ?  ?  ?  ?  ?  ?  ?  ?  ? ?  ?  Cognition Arousal/Alertness: Awake/alert ?Behavior During Therapy: Flat affect ?Overall Cognitive Status: Within Functional Limits for tasks assessed ?  ?  ?  ?  ?  ?  ?  ?  ?  ?  ?  ?  ?  ?  ?  ?  ?  ?  ?  ? ?   ?Exercises   ? ?  ?General Comments General comments (skin integrity, edema, etc.): BP decreasing with changes in position but recovered with increased time in each position, denied lightheadedness ?  ?  ? ?Pertinent Vitals/Pain Pain Assessment ?Pain Assessment: Faces ?Faces Pain Scale: Hurts even more ?Pain Location: abdomen and R leg ?Pain Descriptors / Indicators: Discomfort, Grimacing, Guarding ?Pain Intervention(s): Monitored during session, Limited activity within patient's tolerance, Repositioned, PCA encouraged  ? ? ?Home Living   ?  ?  ?  ?  ?  ?  ?  ?  ?  ?   ?  ?Prior Function    ?  ?  ?   ? ?PT Goals (current goals can now be found in the care plan section) Acute Rehab PT Goals ?Patient Stated Goal: to improve ?PT Goal Formulation: With patient ?Time For Goal Achievement: 12/11/21 ?Potential to Achieve Goals: Good ?Progress towards PT goals: Progressing toward goals ? ?  ?Frequency ? ? ? Min 3X/week ? ? ? ?  ?PT Plan Equipment recommendations need to be updated  ? ? ?Co-evaluation   ?  ?  ?  ?  ? ?  ?AM-PAC PT "6 Clicks" Mobility   ?Outcome Measure ? Help needed turning from your back to your side while in a flat bed without using bedrails?: A Lot ?Help needed moving from lying on your back to sitting on the side of a flat bed without using bedrails?: A Lot ?Help needed moving to and from a bed to a chair (including a wheelchair)?: Total ?Help needed standing up from a chair using your arms (e.g., wheelchair or bedside chair)?: Total ?Help needed to walk in hospital room?: Total ?Help needed climbing 3-5 steps with a railing? : Total ?6 Click Score: 8 ? ?  ?End of Session Equipment Utilized During Treatment: Oxygen ?Activity Tolerance: Patient limited by pain ?Patient left: in bed;with call bell/phone within reach;with nursing/sitter in room ?Nurse Communication: Mobility status;Other (comment) (BP) ?PT Visit Diagnosis: Muscle weakness (generalized) (M62.81);Difficulty in walking, not elsewhere  classified (R26.2);Pain;Unsteadiness on feet (R26.81) ?Pain - Right/Left: Right ?Pain - part of body: Leg (& abdomen) ?  ? ? ?Time: 6967-8938 ?PT Time Calculation (min) (ACUTE ONLY): 25 min ? ?Charges:  $Therapeutic Activity: 23-37 mins          ?          ? ?Moishe Spice, PT, DPT ?Acute Rehabilitation Services  ?Pager: [phone] ?Office: [phone] ? ? ? ?[name] [name] ?11/28/2021, 4:44 PM ? ?

## 2021-11-28 NOTE — Progress Notes (Signed)
Epidural Catheter removed. Tip intact. Patient tolerated Procedure well. ? ? ?[name] [name]. Valma Cava, M.D. ?

## 2021-11-28 NOTE — Addendum Note (Signed)
Addendum  created 11/28/21 0819 by Barnet Glasgow, MD  ? Clinical Note Signed  ?  ?

## 2021-11-28 NOTE — Progress Notes (Signed)
Occupational Therapy Treatment ?Patient Details ?Name: [name] [name] ?MRN: [id number] ?DOB: 09/26/1960 ?Today's Date: 11/28/2021 ? ? ?History of present illness Pt is a 61 y.o. male who presented 11/26/21 for aorto-bi-iliac bypass, right ilio-femoral bypass, right femoral-posterior tibial artery bypass, and umbilical hernia repair secondary to subacute ischemia of the R lower extremity with early tissue loss. 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  ?OT comments ? Continues to participate well, limited by discomfort and orthostatic BP.  Patient is scheduled to have epidural removed and will transition to oral pain meds.  OT to continue efforts for OOB progression and ADL participation.  Patient will be an excellent candidate for AIR and a more aggressive post acute rehab.     ? ?Recommendations for follow up therapy are one component of a multi-disciplinary discharge planning process, led by the attending physician.  Recommendations may be updated based on patient status, additional functional criteria and insurance authorization. ?   ?Follow Up Recommendations ? Acute inpatient rehab (3hours/day)  ?  ?Assistance Recommended at Discharge Frequent or constant Supervision/Assistance  ?Patient can return home with the following ? A lot of help with walking and/or transfers;A lot of help with bathing/dressing/bathroom;Assist for transportation;Assistance with cooking/housework;Help with stairs or ramp for entrance ?  ?Equipment Recommendations ? BSC/3in1  ?  ?Recommendations for Other Services   ? ?  ?Precautions / Restrictions Precautions ?Precautions: Fall ?Precaution Comments: A-line, wound vac, epidural, foley cath, Watch BP and O2 sats ?Restrictions ?Weight Bearing Restrictions: Yes ?Other Position/Activity Restrictions: a-line  ? ? ?  ? ?Mobility Bed Mobility ?  ?Bed Mobility: Supine to  Sit, Sit to Supine ?  ?  ?Supine to sit: Min assist, HOB elevated, +2 for safety/equipment ?Sit to supine: Mod assist, +2 for physical assistance ?  ?  ?Patient Response: Cooperative ? ?Transfers ?  ?  ?  ?  ?  ?  ?  ?  ?  ?General transfer comment: NT due to drop in BP ?  ?  ?Balance Overall balance assessment: Needs assistance ?Sitting-balance support: Feet supported, Bilateral upper extremity supported ?Sitting balance-Leahy Scale: Fair ?Sitting balance - Comments: not as orthostatic and able to better support himself in sitting ?  ?  ?  ?  ?  ?  ?  ?  ?  ?  ?  ?  ?  ?  ?  ?   ? ? ? ?   ?  ?  ?  ?  ?  ? ?   ?  ?  ?   ?  ?   ?  ? ?Cognition  Improved command following and complex thought ?  ?  ?  ?  ?  ?  ?  ?  ?  ?  ?  ?  ?  ?  ?  ?  ?  ?  ?  ?  ?  ?   ?Exercises General Exercises - Upper Extremity ?Shoulder Flexion: AROM, Both, 10 reps, Supine ?Elbow Flexion: AROM, Both, 10 reps, Supine ?Elbow Extension: AROM, Both, 10 reps ?Digit Composite Flexion: AROM, 10 reps, Both ?Composite Extension: AROM, 10 reps, Both ? ?  ?Shoulder Instructions   ? ? ?  ?General Comments    ? ? ?Pertinent Vitals/ Pain       Pain Assessment ?Faces Pain Scale: Hurts even more ?Pain Location: abdomen and R leg ?Pain Descriptors / Indicators: Discomfort, Grimacing, Guarding ?  Pain Intervention(s): Monitored during session ? ?   ?  ?  ?  ?  ?  ?  ?  ?  ?  ?  ?  ?  ?  ?  ?  ?  ?  ?  ? ?  ?    ?  ?  ?  ?   ? ?Frequency ? Min 2X/week  ? ? ? ? ?  ?Progress Toward Goals ? ?OT Goals(current goals can now be found in the care plan section) ? Progress towards OT goals: Progressing toward goals ? ?Acute Rehab OT Goals ?OT Goal Formulation: With patient ?Time For Goal Achievement: 12/12/21 ?Potential to Achieve Goals: Good ?ADL Goals ?Pt Will Perform Grooming: with supervision;sitting ?Pt Will Perform Upper Body Dressing: with supervision;sitting ?Pt Will Perform Lower Body Dressing: with min guard assist;sit to/from stand;with adaptive equipment ?Pt  Will Transfer to Toilet: with min guard assist;ambulating;regular height toilet ?Pt/caregiver will Perform Home Exercise Program: Increased strength;Both right and left upper extremity;With Supervision  ?Plan Discharge plan remains appropriate   ? ?Co-evaluation ? ? ?   ?  ?  ?  ?  ? ?  ?AM-PAC OT "6 Clicks" Daily Activity     ?Outcome Measure ? ? Help from another person eating meals?: A Little ?Help from another person taking care of personal grooming?: A Little ?Help from another person toileting, which includes using toliet, bedpan, or urinal?: A Lot ?Help from another person bathing (including washing, rinsing, drying)?: A Lot ?Help from another person to put on and taking off regular upper body clothing?: A Lot ?Help from another person to put on and taking off regular lower body clothing?: A Lot ?6 Click Score: 14 ? ?  ?End of Session Equipment Utilized During Treatment: Oxygen ? ?OT Visit Diagnosis: Unsteadiness on feet (R26.81);Muscle weakness (generalized) (M62.81);Pain;Dizziness and giddiness (R42) ?Pain - Right/Left: Right ?Pain - part of body: Leg ?  ?Activity Tolerance   ?  ?Patient Left in bed;with call bell/phone within reach;with nursing/sitter in room ?  ?Nurse Communication Mobility status ?  ? ?   ? ?Time: 8841-6606 ?OT Time Calculation (min): 22 min ? ?Charges: OT General Charges ?$OT Visit: 1 Visit ?OT Treatments ?$Therapeutic Activity: 8-22 mins ? ?11/28/2021 ? ?RP, OTR/L ? ?Acute Rehabilitation Services ? ?Office:  [phone] ? ? ?Owin Vignola D Asti Mackley ?11/28/2021, 9:52 AM ?

## 2021-11-28 NOTE — Anesthesia Post-op Follow-up Note (Signed)
?  Anesthesia Pain Follow-up Note ? ?Patient: [name] [name] ? ?Day #: 2 ? ?Date of Follow-up: 11/28/2021 Time: 7:57 AM ? ?Last Vitals:  ?Vitals:  ? 11/28/21 0600 11/28/21 0700  ?BP: 100/76   ?Pulse:    ?Resp: 15 (!) 27  ?Temp:    ?SpO2: 95% 95%  ? ? ?Level of Consciousness: alert ? ?Pain: mild  ? ?Side Effects:None ? ?Catheter Site Exam:clean ? ?Epidural / Intrathecal (From admission, onward)  ? Start     Dose/Rate Route Frequency Ordered Stop  ? 11/27/21 0300  ropivacaine (PF) 2 mg/mL (0.2%) (NAROPIN) injection       ? 8 mL/hr ?8 mL/hr  Epidural Continuous 11/27/21 0202    ?  ? ? ? ?Plan: D/C Infusion at surgeon's request after PCA Started as patient to be fully anticoagulated. ? ?Barnet Glasgow ? ? ? ?

## 2021-11-28 NOTE — Progress Notes (Addendum)
ANTICOAGULATION CONSULT NOTE - Initial Consult ? ?Pharmacy Consult for IV heparin ?Indication: PAF, recurrent DVTs ? ?No Known Allergies ? ?Patient Measurements: ?Height: '6\' 3"'$  (190.5 cm) ?Weight: 108.9 kg (240 lb 1.3 oz) ?IBW/kg (Calculated) : 84.5 ?Heparin Dosing Weight: ~ 104 kg ? ?Vital Signs: ?Temp: 97.6 ?F (36.4 ?C) (05/12 0800) ?Temp Source: Oral (05/12 0800) ?BP: 100/76 (05/12 0600) ? ?Labs: ?Recent Labs  ?  11/26/21 ?0646 11/26/21 ?1021 11/26/21 ?1700 11/27/21 ?0415 11/27/21 ?5784 11/28/21 ?0345  ?HGB  --    < > 13.3 12.3* 12.2* 11.1*  ?HCT  --    < > 39.5 37.7* 36.0* 33.8*  ?PLT  --    < > 231 274  --  166  ?APTT 30  --  34  --   --   --   ?LABPROT 13.6  --  14.8  --   --   --   ?INR 1.1  --  1.2  --   --   --   ?CREATININE  --   --  1.04 1.01  --  0.94  ? < > = values in this interval not displayed.  ? ? ?Estimated Creatinine Clearance: 110.1 mL/min (by C-G formula based on SCr of 0.94 mg/dL). ? ? ?Medical History: ?Past Medical History:  ?Diagnosis Date  ? Aneurysm artery, popliteal ([place]) 10/01/2014  ? Right 1st seen 11/14; thrombosed 11/15  ? 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 Right SFA 05/07/17 while on warfarin INR 2.9  ? 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 Right SFA 05/07/17 while on warfarin INR 2.9  ? Atrial fibrillation ([place])   ? Benign essential HTN 01/04/2012  ? Chronic anticoagulation 01/02/2013  ? Dermatofibroma of forearm 01/02/2013  ? Left side  ? DVT, lower extremity, proximal ([place]) 05/24/2013  ? Right leg femoral & popliteal 05/24/13  ? Hyperlipidemia, mixed 01/04/2012  ? Hypothyroidism   ? Pneumonia   ? 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 Primary hypercoagulable state ([place]) 10/01/2014  ? 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[place])   ? Sinus bradycardia, chronic 01/04/2012  ? Superficial thrombosis of lower extremity 05/02/2012  ? ? ?Medications:  ?Infusions:  ? sodium chloride    ?  ceFAZolin (ANCEF) IV  Stopped (11/28/21 6962)  ? heparin    ? lactated ringers 100 mL/hr at 11/28/21 1233  ? phenylephrine (NEO-SYNEPHRINE) Adult infusion Stopped (11/28/21 9528)  ? ? ?Assessment: ?61 yo male on chronic Xarelto, s/p complicated vascular surgery.  Anticoagulation was held immediately post-op due to the presence of an epidural catheter.  Now epidural has been removed, and pharmacy asked to start IV heparin.  CBC WNL, no overt bleeding or complications noted. ? ?Of note, recently in April, pt requiring high doses of heparin (~ 2600 units/hr) to reach therapeutic levels.   ? ?Spoke to Dr. [name], usual policy is not to begin anticoagulation until after epidural out for 12 hrs.  He's okay with waiting until then.  Epidural removed 5/12 @ 1133 AM. ? ?Goal of Therapy:  ?Heparin level 0.3-0.7 units/ml ?Monitor platelets by anticoagulation protocol: Yes ?  ?Plan:  ?Start IV heparin 1500 units/hr at midnight tonight.  Starting at a more conservative initial rate given recent surgery and recent epidural removal. ?Check heparin level 6 hrs after gtt starts. ?Daily heparin level and CBC. ?F/u ability to transition back to oral anticoagulation eventually. ? ?[place] Crane, Pharm D, BCPS, BCCP ?Clinical Pharmacist ? 11/28/2021 12:46 PM  ? ?  [facility] pharmacy phone numbers are listed on [email or web address] ? ? ? ?

## 2021-11-29 LAB — BASIC METABOLIC PANEL
Anion gap: 10 (ref 5–15)
BUN: 9 mg/dL (ref 8–23)
CO2: 25 mmol/L (ref 22–32)
Calcium: 8.6 mg/dL — ABNORMAL LOW (ref 8.9–10.3)
Chloride: 106 mmol/L (ref 98–111)
Creatinine, Ser: 0.89 mg/dL (ref 0.61–1.24)
GFR, Estimated: >60 mL/min (ref >60)
Glucose, Bld: 100 mg/dL — ABNORMAL HIGH (ref 70–99)
Potassium: 3.8 mmol/L (ref 3.5–5.1)
Sodium: 141 mmol/L (ref 135–145)

## 2021-11-29 LAB — CBC
HCT: 35.5 % — ABNORMAL LOW (ref 39.0–52.0)
Hemoglobin: 11.4 g/dL — ABNORMAL LOW (ref 13.0–17.0)
MCH: 31.2 pg (ref 26.0–34.0)
MCHC: 32.1 g/dL (ref 30.0–36.0)
MCV: 97.3 fL (ref 80.0–100.0)
Platelets: 166 10*3/uL (ref 150–400)
RBC: 3.65 MIL/uL — ABNORMAL LOW (ref 4.22–5.81)
RDW: 14.6 % (ref 11.5–15.5)
WBC: 8.7 10*3/uL (ref 4.0–10.5)
nRBC: 0 % (ref 0.0–0.2)

## 2021-11-29 LAB — HEPARIN LEVEL (UNFRACTIONATED)
Heparin Unfractionated: <0.1 IU/mL — ABNORMAL LOW (ref 0.30–0.70)
Heparin Unfractionated: 0.22 IU/mL — ABNORMAL LOW (ref 0.30–0.70)
Heparin Unfractionated: 0.31 IU/mL (ref 0.30–0.70)

## 2021-11-29 MED ORDER — POTASSIUM CHLORIDE 10 MEQ/50ML IV SOLN
10.0000 meq | INTRAVENOUS | Status: AC
  Administered 2021-11-29 (×2): 10 meq via INTRAVENOUS
  Filled 2021-11-29 (×2): qty 50

## 2021-11-29 MED ORDER — METOPROLOL SUCCINATE ER 25 MG PO TB24
25.0000 mg | ORAL_TABLET | Freq: Every day | ORAL | Status: DC
Start: 2021-11-29 — End: 2021-12-03
  Administered 2021-11-29 – 2021-12-03 (×5): 25 mg via ORAL
  Filled 2021-11-29 (×5): qty 1

## 2021-11-29 NOTE — Progress Notes (Signed)
ANTICOAGULATION CONSULT NOTE - Follow Up Consult ? ?Pharmacy Consult for heparin ?Indication:  PAF/DVTs ? ?Labs: ?Recent Labs  ?  11/26/21 ?1700 11/27/21 ?0415 11/27/21 ?6060 11/28/21 ?0345 11/29/21 ?0459  ?HGB 13.3 12.3* 12.2* 11.1* 11.4*  ?HCT 39.5 37.7* 36.0* 33.8* 35.5*  ?PLT 231 274  --  166 166  ?APTT 34  --   --   --   --   ?LABPROT 14.8  --   --   --   --   ?INR 1.2  --   --   --   --   ?HEPARINUNFRC  --   --   --   --  <0.10*  ?CREATININE 1.04 1.01  --  0.94 0.89  ? ? ?Assessment: ?61yo male subtherapeutic on heparin with initial dosing while Xarelto on hold; no infusion issues or signs of bleeding per RN. ? ?Goal of Therapy:  ?Heparin level 0.3-0.7 units/ml ?  ?Plan:  ?Will increase heparin infusion by 3 units/kg/hr to 1800 units/hr and check level in 6 hours.   ? ?Wynona Neat, PharmD, BCPS  ?11/29/2021,6:51 AM ? ? ?

## 2021-11-29 NOTE — Progress Notes (Signed)
Inpatient Rehab Admissions Coordinator:  ? ? I spoke with pt and family regarding potential CIR admit. They state interest and indicate that family can provide 24/7 support at d/c. I will follow for potential admit pending insurance auth and bed availability. ? ?Clemens Catholic, MS, CCC-SLP ?Rehab Admissions Coordinator  ?[phone] (celll) ?[phone] (office) ? ?

## 2021-11-29 NOTE — Progress Notes (Signed)
[facility]  ? ?Subjective  - no acute events overnight. ? ? ?Objective ?110/79 ?88 ?98.1 ?F (36.7 ?C) (Oral) ?(!) 25 ?94% ? ?Intake/Output Summary (Last 24 hours) at 11/29/2021 0930 ?Last data filed at 11/29/2021 0400 ?Gross per 24 hour  ?Intake 2124.18 ml  ?Output 450 ml  ?Net 1674.18 ml  ? ? ?Midline abdominal incision clean dry and intact ?Right groin incision with VAC with good seal ?Right leg incision clean dry and intact ?Right DP and PT brisk by Doppler ?Left DP palpable ? ?Laboratory ?Lab Results: ?Recent Labs  ?  11/28/21 ?0345 11/29/21 ?1856  ?WBC 10.6* 8.7  ?HGB 11.1* 11.4*  ?HCT 33.8* 35.5*  ?PLT 166 166  ? ?BMET ?Recent Labs  ?  11/28/21 ?0345 11/29/21 ?3149  ?NA 138 141  ?K 3.6 3.8  ?CL 107 106  ?CO2 25 25  ?GLUCOSE 109* 100*  ?BUN 9 9  ?CREATININE 0.94 0.89  ?CALCIUM 8.2* 8.6*  ? ? ?COAG ?Lab Results  ?Component Value Date  ? INR 1.2 11/26/2021  ? INR 1.1 11/26/2021  ? INR 1.2 11/07/2021  ? PROTIME 28.8 (H) 02/04/2015  ? PROTIME 28.8 (H) 12/10/2014  ? PROTIME 36.0 (H) 08/24/2014  ? ?No results found for: PTT ? ?Assessment/Planning: ? ?Postop day 3 status post ?1) aorto-bi-iliac bypass (20x70m [place]) ?2) right ilio-femoral bypass (848mDacron) ?3) right femoral - posterior tibial artery bypass (32m60mTFE) ?4) umbilical hernia repair ? ?Neuro: Pain control with PCA.  GCS 15 ?CV: Hemoglobin 11.4.  A-fib with RVR.  Will restart home metoprolol p.o for rate control.  Continue heparin for A-fib (also hx of DVT and embolus) ?Pulm.  Nasal cannula no respiratory distress ?GI: Tolerating clear liquid diet.  States he is passing gas.  Will leave on clears today given he was burping a lot at bedside. ?ID no active infectious concerns. ?Renal: Foley out.  Urine output 450 mL last 24 hours.  Creatinine 0.89. ? ?Dispo: discussed transfer to the floor this afternoon with bedside nursing.  They feel he does not have any critical care needs at this time.  Looks good.  Both lower  extremities well perfused. ? ?ChrMarty Heck/13/2023 ?9:30 AM ?-- ? ? ?

## 2021-11-29 NOTE — Progress Notes (Signed)
ANTICOAGULATION CONSULT NOTE ? ?Pharmacy Consult for IV heparin ?Indication: PAF, recurrent DVTs ? ?No Known Allergies ? ?Patient Measurements: ?Height: '6\' 3"'$  (190.5 cm) ?Weight: 108.9 kg (240 lb 1.3 oz) ?IBW/kg (Calculated) : 84.5 ?Heparin Dosing Weight: ~ 104 kg ? ?Vital Signs: ?Temp: 98.4 ?F (36.9 ?C) (05/13 1059) ?Temp Source: Oral (05/13 1059) ?BP: 113/91 (05/13 1200) ?Pulse Rate: 121 (05/13 1200) ? ?Labs: ?Recent Labs  ?  11/26/21 ?1700 11/27/21 ?0415 11/27/21 ?4827 11/28/21 ?0345 11/29/21 ?0786 11/29/21 ?1209  ?HGB 13.3 12.3* 12.2* 11.1* 11.4*  --   ?HCT 39.5 37.7* 36.0* 33.8* 35.5*  --   ?PLT 231 274  --  166 166  --   ?APTT 34  --   --   --   --   --   ?LABPROT 14.8  --   --   --   --   --   ?INR 1.2  --   --   --   --   --   ?HEPARINUNFRC  --   --   --   --  <0.10* 0.22*  ?CREATININE 1.04 1.01  --  0.94 0.89  --   ? ? ? ?Estimated Creatinine Clearance: 116.3 mL/min (by C-G formula based on SCr of 0.89 mg/dL). ? ? ?Medical History: ?Past Medical History:  ?Diagnosis Date  ? Aneurysm artery, popliteal ([place]) 10/01/2014  ? Right 1st seen 11/14; thrombosed 11/15  ? 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 Right SFA 05/07/17 while on warfarin INR 2.9  ? 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 Right SFA 05/07/17 while on warfarin INR 2.9  ? Atrial fibrillation ([place])   ? Benign essential HTN 01/04/2012  ? Chronic anticoagulation 01/02/2013  ? Dermatofibroma of forearm 01/02/2013  ? Left side  ? DVT, lower extremity, proximal ([place]) 05/24/2013  ? Right leg femoral & popliteal 05/24/13  ? Hyperlipidemia, mixed 01/04/2012  ? Hypothyroidism   ? Pneumonia   ? 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 Primary hypercoagulable state ([place]) 10/01/2014  ? 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[place])   ? Sinus bradycardia, chronic 01/04/2012  ? Superficial thrombosis of lower extremity 05/02/2012  ? ? ?Medications:  ?Infusions:  ? sodium chloride    ? heparin  1,800 Units/hr (11/29/21 1200)  ? lactated ringers 100 mL/hr at 11/29/21 1200  ? phenylephrine (NEO-SYNEPHRINE) Adult infusion Stopped (11/28/21 1646)  ? ? ?Assessment: ?61 yo male on chronic Xarelto, s/p complicated vascular surgery.  Anticoagulation was held immediately post-op due to the presence of an epidural catheter.  Now epidural has been removed, and pharmacy asked to start IV heparin.  CBC WNL, no overt bleeding or complications noted. ? ?Of note, recently in April, pt requiring high doses of heparin (~ 2600 units/hr) to reach therapeutic levels.   ? ?Spoke to Dr. [name], usual policy is not to begin anticoagulation until after epidural out for 12 hrs.  He's okay with waiting until then.  Epidural removed 5/12 @ 1133 AM. Heparin gtt '@1500'$  units/hr restarted at [place]. Subtherapeutic this morning and increased to 1800 units/hr. ? ?Heparin level 0.22 subtherapeutic on 1800 units/hr. Level drawn appropriately. CBC stable. No bleeding noted.  ? ?Goal of Therapy:  ?Heparin level 0.3-0.7 units/ml ?Monitor platelets by anticoagulation protocol: Yes ?  ?Plan:  ?Increase heparin rate to 2000 units/hr ?6 hour heparin level ?Daily heparin level and CBC. ?F/u ability to transition back to oral anticoagulation eventually. ? ?Laurey Arrow, PharmD ?PGY1 Pharmacy  Resident ?11/29/2021  1:12 PM ? ?Please check [email or web address] for unit-specific pharmacy phone numbers. ? ?

## 2021-11-29 NOTE — Progress Notes (Signed)
ANTICOAGULATION CONSULT NOTE ? ?Pharmacy Consult for IV heparin ?Indication: PAF, recurrent DVTs ? ?No Known Allergies ? ?Patient Measurements: ?Height: '6\' 3"'$  (190.5 cm) ?Weight: 108.9 kg (240 lb 1.3 oz) ?IBW/kg (Calculated) : 84.5 ?Heparin Dosing Weight: ~ 104 kg ? ?Vital Signs: ?Temp: 98.4 ?F (36.9 ?C) (05/13 1926) ?Temp Source: Oral (05/13 1926) ?BP: 109/66 (05/13 1926) ?Pulse Rate: 106 (05/13 1928) ? ?Labs: ?Recent Labs  ?  11/27/21 ?0415 11/27/21 ?9735 11/28/21 ?0345 11/29/21 ?3299 11/29/21 ?1209 11/29/21 ?2100  ?HGB 12.3* 12.2* 11.1* 11.4*  --   --   ?HCT 37.7* 36.0* 33.8* 35.5*  --   --   ?PLT 274  --  166 166  --   --   ?HEPARINUNFRC  --   --   --  <0.10* 0.22* 0.31  ?CREATININE 1.01  --  0.94 0.89  --   --   ? ? ? ?Estimated Creatinine Clearance: 116.3 mL/min (by C-G formula based on SCr of 0.89 mg/dL). ? ? ?Medical History: ?Past Medical History:  ?Diagnosis Date  ? 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 Right 1st seen 11/14; thrombosed 11/15  ? 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 Right SFA 05/07/17 while on warfarin INR 2.9  ? 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 Right SFA 05/07/17 while on warfarin INR 2.9  ? Atrial fibrillation ([place])   ? Benign essential HTN 01/04/2012  ? Chronic anticoagulation 01/02/2013  ? Dermatofibroma of forearm 01/02/2013  ? Left side  ? DVT, lower extremity, proximal ([place]) 05/24/2013  ? Right leg femoral & popliteal 05/24/13  ? Hyperlipidemia, mixed 01/04/2012  ? Hypothyroidism   ? Pneumonia   ? 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 Primary hypercoagulable state ([place]) 10/01/2014  ? 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[place])   ? Sinus bradycardia, chronic 01/04/2012  ? Superficial thrombosis of lower extremity 05/02/2012  ? ? ?Medications:  ?Infusions:  ? sodium chloride    ? heparin 2,000 Units/hr (11/29/21 1400)  ? lactated ringers 100 mL/hr at 11/29/21 1400  ? phenylephrine  (NEO-SYNEPHRINE) Adult infusion Stopped (11/28/21 1646)  ? ? ?Assessment: ?61 yo male on chronic Xarelto, s/p complicated vascular surgery.  Anticoagulation was held immediately post-op due to the presence of an epidural catheter.  Now epidural has been removed, and pharmacy asked to start IV heparin.  CBC WNL, no overt bleeding or complications noted. ? ?Of note, recently in April, pt requiring high doses of heparin (~ 2600 units/hr) to reach therapeutic levels.   ? ?Spoke to Dr. [name], usual policy is not to begin anticoagulation until after epidural out for 12 hrs.  He's okay with waiting until then.  Epidural removed 5/12 @ 1133 AM. Heparin gtt '@1500'$  units/hr restarted at [place]. Subtherapeutic this morning and increased to 1800 units/hr. ? ?Heparin level 0.22 subtherapeutic on 1800 units/hr. Level drawn appropriately. CBC stable. No bleeding noted.  ? ?Heparin level came back therapeutic tonight but on the lower end. Will increase it slightly and check in AM. ?Goal of Therapy:  ?Heparin level 0.3-0.7 units/ml ?Monitor platelets by anticoagulation protocol: Yes ?  ?Plan:  ?Increase heparin rate to 2050 units/hr ?Daily heparin level and CBC. ?F/u ability to transition back to oral anticoagulation eventually. ? ?Onnie Boer, PharmD, BCIDP, AAHIVP, CPP ?Infectious Disease Pharmacist ?11/29/2021 9:43 PM ? ? ? ?

## 2021-11-30 LAB — CBC
HCT: 34.1 % — ABNORMAL LOW (ref 39.0–52.0)
Hemoglobin: 11.1 g/dL — ABNORMAL LOW (ref 13.0–17.0)
MCH: 31.4 pg (ref 26.0–34.0)
MCHC: 32.6 g/dL (ref 30.0–36.0)
MCV: 96.3 fL (ref 80.0–100.0)
Platelets: 211 10*3/uL (ref 150–400)
RBC: 3.54 MIL/uL — ABNORMAL LOW (ref 4.22–5.81)
RDW: 14.1 % (ref 11.5–15.5)
WBC: 7.7 10*3/uL (ref 4.0–10.5)
nRBC: 0 % (ref 0.0–0.2)

## 2021-11-30 LAB — BASIC METABOLIC PANEL
Anion gap: 9 (ref 5–15)
BUN: 8 mg/dL (ref 8–23)
CO2: 27 mmol/L (ref 22–32)
Calcium: 8.4 mg/dL — ABNORMAL LOW (ref 8.9–10.3)
Chloride: 103 mmol/L (ref 98–111)
Creatinine, Ser: 0.89 mg/dL (ref 0.61–1.24)
GFR, Estimated: >60 mL/min (ref >60)
Glucose, Bld: 92 mg/dL (ref 70–99)
Potassium: 3.8 mmol/L (ref 3.5–5.1)
Sodium: 139 mmol/L (ref 135–145)

## 2021-11-30 LAB — BPAM RBC
Blood Product Expiration Date: 202305232359
Blood Product Expiration Date: 202305232359
Blood Product Expiration Date: 202305242359
Blood Product Expiration Date: 202305242359
Blood Product Expiration Date: 202305252359
Blood Product Expiration Date: 202305262359
ISSUE DATE / TIME: 202305100938
ISSUE DATE / TIME: 202305100938
ISSUE DATE / TIME: 202305100938
ISSUE DATE / TIME: 202305100938
Unit Type and Rh: 7300
Unit Type and Rh: 7300
Unit Type and Rh: 7300
Unit Type and Rh: 7300
Unit Type and Rh: 7300
Unit Type and Rh: 7300

## 2021-11-30 LAB — TYPE AND SCREEN
ABO/RH(D): B POS
Antibody Screen: NEGATIVE
Unit division: 0
Unit division: 0
Unit division: 0
Unit division: 0
Unit division: 0
Unit division: 0

## 2021-11-30 LAB — HEPARIN LEVEL (UNFRACTIONATED)
Heparin Unfractionated: 0.27 IU/mL — ABNORMAL LOW (ref 0.30–0.70)
Heparin Unfractionated: 0.4 IU/mL (ref 0.30–0.70)
Heparin Unfractionated: 0.12 IU/mL — ABNORMAL LOW (ref 0.30–0.70)

## 2021-11-30 MED ORDER — HEPARIN BOLUS VIA INFUSION
2000.0000 [IU] | Freq: Once | INTRAVENOUS | Status: AC
  Administered 2021-11-30: 2000 [IU] via INTRAVENOUS
  Filled 2021-11-30: qty 2000

## 2021-11-30 NOTE — Progress Notes (Addendum)
[facility] ? ?Subjective  - Tolerating clears with complaints of acid reflux.  No N/V. ? ? ?Objective ?111/81 ?88 ?98.7 ?F (37.1 ?C) (Oral) ?20 ?97% ? ?Intake/Output Summary (Last 24 hours) at 11/30/2021 1027 ?Last data filed at 11/30/2021 0500 ?Gross per 24 hour  ?Intake 632.34 ml  ?Output 450 ml  ?Net 182.34 ml  ? ? ?Doppler signal DP/PT intact right LE leg incision healing well.  Incisional vac in place with good suction. ?Left LE Palpable DP pulse ?Abdominal incision healing well, mild distention, soft to palpation. ?Lungs O2 support without labored breathing ?Heart RRR ? ?Assessment/Planning: ?Postop day 4 status post ?1) aorto-bi-iliac bypass (20x47m [place]) ?2) right ilio-femoral bypass (810mDacron) ?3) right femoral - posterior tibial artery bypass (49m38mTFE) ?4) umbilical hernia repair ? ?He states he has passed flatus, no N/V with clears.  Acid reflux reported he on Protonix. ?Continue clears for another day.  Possible advance diet tomorrow ?PT/OT to continue working with patient.  Current recommendation is for CIR. ?Right LE with good inflow and doppler signal DP/PT ?Cont incisional vac until discharge. ?Cr WNL, urine OP good 550 since 7 am. ?HGB stable 11.4 after 2 units PRBC and 2 FFP on 11/26/21 blood loss anemia. ?Heart Rate improved after starting long acting metoprolol.  ? ? ?EmmRoxy Horseman/14/2023 ?10:27 AM ?-- ? ?Laboratory ?Lab Results: ?Recent Labs  ?  11/29/21 ?0541275/14/23 ?0540  ?WBC 8.7 7.7  ?HGB 11.4* 11.1*  ?HCT 35.5* 34.1*  ?PLT 166 211  ? ?BMET ?Recent Labs  ?  11/29/21 ?0541700/14/23 ?0540  ?NA 141 139  ?K 3.8 3.8  ?CL 106 103  ?CO2 25 27  ?GLUCOSE 100* 92  ?BUN 9 8  ?CREATININE 0.89 0.89  ?CALCIUM 8.6* 8.4*  ? ? ?COAG ?Lab Results  ?Component Value Date  ? INR 1.2 11/26/2021  ? INR 1.1 11/26/2021  ? INR 1.2 11/07/2021  ? PROTIME 28.8 (H) 02/04/2015  ? PROTIME 28.8 (H) 12/10/2014  ? PROTIME 36.0 (H) 08/24/2014  ? ?No results found for: PTT ? ? ?I  have seen and evaluated the patient. I agree with the PA note as documented above.  Moved out of the ICU yesterday.  Stable on the floor overnight.  Tolerating clear liquids this morning.  Still a bit burpy but states he is passing gas.  I will leave him on clears again today given burping with some abdominal distention on exam.  Denies any associated nausea.  All of his incisions look good.  He has a palpable PT on the right and DP on the left.  States he got out of bed yesterday for several hours.  I did restart his long-acting metoprolol yesterday and he has better rate control is setting of chronic atrial fibrillation.  On heparin drip for history of hyper coagulability and atrial fibrillation.  Overall looks good.  Will decrease IVF to 50 mL/hr.  Have asked nursing to take out triple lumen from neck.  A line d/c'd yesterday. ? ?ChrMarty HeckD ?[facility]: [phone]?

## 2021-11-30 NOTE — Progress Notes (Signed)
PT Cancellation Note ? ?Patient Details ?Name: [name] [name] ?MRN: [id number] ?DOB: [birth year] ? ? ?Cancelled Treatment:    Reason Eval/Treat Not Completed: Other (comment). Pt reporting being nauseated and politely declined to get OOB secondary to this at this time. Pt agreeable to get up to chair later with nursing and to attempting with PT later if time permits. ? ?Moishe Spice, PT, DPT ?Acute Rehabilitation Services  ?Pager: [phone] ?Office: [phone] ? ? ? ?Maretta Bees Pettis ?11/30/2021, 1:32 PM ? ? ?

## 2021-11-30 NOTE — PMR Pre-admission (Signed)
PMR Admission Coordinator Pre-Admission Assessment ? ?Patient: [name] [name] is an 61 y.o., male ?MRN: [id number] ?DOB: 01/23/1961 ?Height: '6\' 3"'$  (190.5 cm) ?Weight: 108.9 kg ? ?Insurance Information ?HMO:     PPO:      PCP:      IPA:      80/20:      OTHER:  ?PRIMARY: [employer]      Policy#: ?[id number]       Subscriber: Pt ?CM Name: Chinita Greenland       Phone#: [phone]     Fax#: [phone] [place] with [employer] called 12/02/21 with a 7 day approval. Pt. Approved until 5/23 with updates due 5/23   ?Pre-Cert#: [id number]      Employer:  ?Benefits:  Phone #: [phone]     Name:  ?Irene Shipper Date: 04/19/2020 - 12/17/2024  ?Deductible: $0 - does not have  ?OOP Max:?$0 - does not have  ?CIR: 100% coverage  ?SNF: 100% coverage  ?Outpatient:?$4 co-pay; limited to 27 visits/cal yr (27 remaining)  ?Home Health: 100% coverage; limited by medical necessity review  ?DME: 100% coverage;?limited by medical necessity review  ?Providers: in network  ?SECONDARY:       Policy#:      Phone#:  ? ?Financial Counselor:       Phone#:  ? ?The ?Data Collection Information Summary? for patients in Inpatient Rehabilitation Facilities with attached ?Privacy Act [place] Records? was provided and verbally reviewed with: Patient ? ?Emergency Contact Information ?Contact Information   ? ? Name Relation Home Work Mobile  ? Poe,Lisa Significant other [phone]    ? Hutch, Rhett Daughter   [phone]  ? ?  ? ? ?Current Medical History  ?Patient Admitting Diagnosis: RLE ischemia ?History of Present Illness: Pt is a 61 y.o. 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 was seen by PT/OT who recommended CIR to assist return to PLOF.  ? ?Patient[facility] has been reviewed by the rehabilitation admission coordinator and physician. ? ?Past Medical History  ?Past Medical History:  ?Diagnosis Date  ? 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 Right 1st seen 11/14; thrombosed 11/15  ? 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 Right SFA 05/07/17 while on warfarin INR 2.9  ? 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 Right SFA 05/07/17 while on warfarin INR 2.9  ? Atrial fibrillation ([place])   ? Benign essential HTN 01/04/2012  ? Chronic anticoagulation 01/02/2013  ? Dermatofibroma of forearm 01/02/2013  ? Left side  ? DVT, lower extremity, proximal ([place]) 05/24/2013  ? Right leg femoral & popliteal 05/24/13  ? Hyperlipidemia, mixed 01/04/2012  ? Hypothyroidism   ? Pneumonia   ? 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 Primary hypercoagulable state ([place]) 10/01/2014  ? 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 Sinus bradycardia, chronic 01/04/2012  ? Superficial thrombosis of lower extremity 05/02/2012  ? ? ?Has the patient had major surgery during 100 days prior to admission? Yes ? ?Family History   ?family history includes Stroke in his father. ? ?Current Medications ? ?Current Facility-Administered Medications:  ?  0.9 %  sodium  chloride infusion (Manually program via Guardrails IV Fluids), , Intravenous, Once, Laurence Slate M, PA-C ?  0.9 %  sodium chloride infusion (Manually program via Guardrails IV Fluids), , Intravenous, Once, Laurence Slate M, PA-C ?  0.9 %  sodium chloride infusion, 500 mL, Intravenous, Once PRN, Laurence Slate M, PA-C ?  acetaminophen (TYLENOL) 160 MG/5ML solution 325-650 mg, 325-650 mg, Oral, Q4H PRN, Ulyses Amor, PA-C, 650 mg at 11/27/21 2056 ?  acetaminophen (TYLENOL) tablet 325-650 mg, 325-650 mg, Oral, Q4H  PRN **OR** acetaminophen (TYLENOL) suppository 325-650 mg, 325-650 mg, Rectal, Q4H PRN, Theda Sers, Emma M, PA-C ?  aspirin EC tablet 81 mg, 81 mg, Oral, Daily, Laurence Slate M, PA-C, 81 mg at 11/29/21 0940 ?  atorvastatin (LIPITOR) tablet 20 mg, 20 mg, Oral, Daily, Laurence Slate M, PA-C, 20 mg at 11/29/21 0940 ?  bisacodyl (DULCOLAX) suppository 10 mg, 10 mg, Rectal, Daily PRN, Ulyses Amor, PA-C ?  Chlorhexidine Gluconate Cloth 2 % PADS 6 each, 6 each, Topical, Daily, Ulyses Amor, [place], 6 each at 11/29/21 5009 ?  diphenhydrAMINE (BENADRYL) injection 12.5 mg, 12.5 mg, Intravenous, Q6H PRN **OR** diphenhydrAMINE (BENADRYL) 12.5 MG/5ML elixir 12.5 mg, 12.5 mg, Oral, Q6H PRN, Laurence Slate M, PA-C, 12.5 mg at 11/28/21 2243 ?  heparin ADULT infusion 100 units/mL (25000 units/255m), 2,300 Units/hr, Intravenous, Continuous, Rudisill, TJodean Lima RPH, Last Rate: 23 mL/hr at 11/30/21 0652, 2,300 Units/hr at 11/30/21 03818?  hydrALAZINE (APRESOLINE) injection 5 mg, 5 mg, Intravenous, Q20 Min PRN, CLaurence SlateM, PA-C ?  HYDROmorphone (DILAUDID) 1 mg/mL PCA injection, , Intravenous, Q4H, Collins, Emma M, PA-C, 1.5 mg at 11/30/21 02993?  labetalol (NORMODYNE) injection 10 mg, 10 mg, Intravenous, Q10 min PRN, CLaurence SlateM, PA-C ?  lactated ringers infusion, , Intravenous, Continuous, CUlyses Amor PA-C, Last Rate: 100 mL/hr at 11/29/21 1400, Infusion Verify at 11/29/21 1400 ?  levothyroxine (SYNTHROID) tablet 25 mcg, 25 mcg, Oral, Q0600, CUlyses Amor PA-C, 25 mcg at 11/30/21 07169?  metoprolol succinate (TOPROL-XL) 24 hr tablet 25 mg, 25 mg, Oral, Daily, CLaurence SlateM, PA-C, 25 mg at 11/29/21 0940 ?  metoprolol tartrate (LOPRESSOR) injection 2-5 mg, 2-5 mg, Intravenous, Q2H PRN, CTheda Sers Emma M, PA-C ?  morphine (PF) 2 MG/ML injection 2-5 mg, 2-5 mg, Intravenous, Q1H PRN, CLaurence SlateM, PA-C, 2 mg at 11/28/21 0609 ?  naloxone ([facility] injection 0.4 mg, 0.4 mg, Intravenous, PRN **AND** sodium chloride flush (NS)  0.9 % injection 9 mL, 9 mL, Intravenous, PRN, CTheda Sers Emma M, PA-C ?  ondansetron ([facility] injection 4 mg, 4 mg, Intravenous, Q6H PRN, CLaurence SlateM, PA-C, 4 mg at 11/29/21 0940 ?  pantoprazole (PROTONIX) injection 40 mg, 40 mg, Intravenous, QHS, Collins, Emma M, PA-C, 40 mg at 11/29/21 2215 ?  phenol (CHLORASEPTIC) mouth spray 1 spray, 1 spray, Mouth/Throat, PRN, CTheda Sers Emma M, PA-C ?  potassium chloride SA (KLOR-CON M) CR tablet 20-40 mEq, 20-40 mEq, Oral, Daily PRN, CLaurence SlateM, PA-C ?  traZODone (DESYREL) tablet 50 mg, 50 mg, Oral, QHS, Collins, Emma M, PA-C, 50 mg at 11/28/21 2243 ? ?Patients Current Diet:  ?Diet Order   ? ?       ?  Diet clear liquid Room service appropriate? Yes; Fluid consistency: Thin  Diet effective now       ?  ? ?  ?  ? ?  ? ? ?Precautions / Restrictions ?Precautions ?Precautions: Fall ?Precaution Comments: A-line, wound vac, foley cath, Watch BP  and O2 sats ?Restrictions ?Weight Bearing Restrictions: No ?Other Position/Activity Restrictions: a-line  ? ?Has the patient had 2 or more falls or a fall with injury in the past year? Yes ? ?Prior Activity Level ?Community (5-7x/wk): Pt active in the community PTA ? ?Prior Functional Level ?[name] Care: Did the patient need help bathing, dressing, using the toilet or eating? Independent ? ?Indoor Mobility: Did the patient need assistance with walking from room to room (with or without device)? Independent ? ?Stairs: Did the patient need assistance with internal or external stairs (with or without device)? Needed some help ? ?Functional Cognition: Did the patient need help planning regular tasks such as shopping or remembering to take medications? Independent ? ?Patient Information ?Are you of Hispanic, Latino/a,or Spanish origin?: A. No, not of Hispanic, Latino/a, or Spanish origin ?What is your race?: A. White ?Do you need or want an interpreter to communicate with a doctor or health care staff?: 0. No ? ?Patient's Response To:  ?Health  Literacy and Transportation ?Is the patient able to respond to health literacy and transportation needs?: Yes ?Health Literacy - How often do you need to have someone help you when you read instruc

## 2021-11-30 NOTE — Progress Notes (Addendum)
ANTICOAGULATION CONSULT NOTE ? ?Pharmacy Consult for IV heparin ?Indication: PAF, recurrent DVTs ? ?No Known Allergies ? ?Patient Measurements: ?Height: '6\' 3"'$  (190.5 cm) ?Weight: 108.9 kg (240 lb 1.3 oz) ?IBW/kg (Calculated) : 84.5 ?Heparin Dosing Weight: ~ 104 kg ? ?Vital Signs: ?Temp: 98.1 ?F (36.7 ?C) (05/14 0319) ?Temp Source: Oral (05/14 0319) ?BP: 105/75 (05/14 0319) ?Pulse Rate: 103 (05/14 0319) ? ?Labs: ?Recent Labs  ?  11/28/21 ?0345 11/28/21 ?0345 11/29/21 ?2409 11/29/21 ?1209 11/29/21 ?2100 11/30/21 ?7353 11/30/21 ?2992  ?HGB 11.1*  --  11.4*  --   --  11.1*  --   ?HCT 33.8*  --  35.5*  --   --  34.1*  --   ?PLT 166  --  166  --   --  211  --   ?HEPARINUNFRC  --    < > <0.10* 0.22* 0.31  --  0.27*  ?CREATININE 0.94  --  0.89  --   --  0.89  --   ? < > = values in this interval not displayed.  ? ?Estimated Creatinine Clearance: 116.3 mL/min (by C-G formula based on SCr of 0.89 mg/dL). ? ? ?Medical History: ?Past Medical History:  ?Diagnosis Date  ? 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 Right 1st seen 11/14; thrombosed 11/15  ? 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 Right SFA 05/07/17 while on warfarin INR 2.9  ? 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 Right SFA 05/07/17 while on warfarin INR 2.9  ? Atrial fibrillation ([place])   ? Benign essential HTN 01/04/2012  ? Chronic anticoagulation 01/02/2013  ? Dermatofibroma of forearm 01/02/2013  ? Left side  ? DVT, lower extremity, proximal ([place]) 05/24/2013  ? Right leg femoral & popliteal 05/24/13  ? Hyperlipidemia, mixed 01/04/2012  ? Hypothyroidism   ? Pneumonia   ? 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 Primary hypercoagulable state ([place]) 10/01/2014  ? 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 Sinus bradycardia, chronic 01/04/2012  ? Superficial thrombosis of lower extremity 05/02/2012  ? ? ?Medications:  ?Infusions:  ? sodium chloride    ? heparin 2,050 Units/hr (11/29/21  2226)  ? lactated ringers 100 mL/hr at 11/29/21 1400  ? phenylephrine (NEO-SYNEPHRINE) Adult infusion Stopped (11/28/21 1646)  ? ? ?Assessment: ?61 yo male on chronic Xarelto, s/p complicated vascular surgery.  Anticoagulation was held immediately post-op due to the presence of an epidural catheter.  Now epidural has been removed, and pharmacy asked to start IV heparin.  CBC WNL, no overt bleeding or complications noted. ? ?Of note, recently in April, pt requiring high doses of heparin (~ 2600 units/hr) to reach therapeutic levels.   ? ?Heparin level subtherapeutic: 0.27 on 2050  units/hr. Level drawn appropriately. CBC stable. No bleeding noted. Will increase ~2 units/kg/hr ? ?Goal of Therapy:  ?Heparin level 0.3-0.7 units/ml ?Monitor platelets by anticoagulation protocol: Yes ?  ?Plan:  ?Increase heparin rate to 2300 units/hr ?Daily heparin level and CBC. ?F/u ability to transition back to oral anticoagulation eventually. ? ?Georga Bora, PharmD ?Clinical Pharmacist ?11/30/2021 6:43 AM ?Please check AMION for all [place] numbers ? ? ? ? ?

## 2021-11-30 NOTE — Progress Notes (Signed)
ANTICOAGULATION CONSULT NOTE ? ?Pharmacy Consult for IV heparin ?Indication: PAF, recurrent DVTs ? ?No Known Allergies ? ?Patient Measurements: ?Height: '6\' 3"'$  (190.5 cm) ?Weight: 108.9 kg (240 lb 1.3 oz) ?IBW/kg (Calculated) : 84.5 ?Heparin Dosing Weight: ~ 104 kg ? ?Vital Signs: ?Temp: 98.2 ?F (36.8 ?C) (05/14 1239) ?Temp Source: Oral (05/14 1239) ?BP: 127/99 (05/14 1239) ?Pulse Rate: 100 (05/14 1239) ? ?Labs: ?Recent Labs  ?  11/28/21 ?0345 11/29/21 ?2993 11/29/21 ?1209 11/29/21 ?2100 11/30/21 ?7169 11/30/21 ?6789 11/30/21 ?1336  ?HGB 11.1* 11.4*  --   --  11.1*  --   --   ?HCT 33.8* 35.5*  --   --  34.1*  --   --   ?PLT 166 166  --   --  211  --   --   ?HEPARINUNFRC  --  <0.10*   < > 0.31  --  0.27* 0.40  ?CREATININE 0.94 0.89  --   --  0.89  --   --   ? < > = values in this interval not displayed.  ? ?Estimated Creatinine Clearance: 116.3 mL/min (by C-G formula based on SCr of 0.89 mg/dL). ? ? ?Medical History: ?Past Medical History:  ?Diagnosis Date  ? 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 Right 1st seen 11/14; thrombosed 11/15  ? 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 Right SFA 05/07/17 while on warfarin INR 2.9  ? 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 Right SFA 05/07/17 while on warfarin INR 2.9  ? Atrial fibrillation ([place])   ? Benign essential HTN 01/04/2012  ? Chronic anticoagulation 01/02/2013  ? Dermatofibroma of forearm 01/02/2013  ? Left side  ? DVT, lower extremity, proximal ([place]) 05/24/2013  ? Right leg femoral & popliteal 05/24/13  ? Hyperlipidemia, mixed 01/04/2012  ? Hypothyroidism   ? Pneumonia   ? 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 Primary hypercoagulable state ([place]) 10/01/2014  ? 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 Sinus bradycardia, chronic 01/04/2012  ? Superficial thrombosis of lower extremity 05/02/2012  ? ? ?Medications:  ?Infusions:  ? sodium chloride    ? heparin 2,300 Units/hr (11/30/21  3810)  ? lactated ringers 50 mL/hr at 11/30/21 1331  ? ? ?Assessment: ?61 yo male on chronic Xarelto, s/p complicated vascular surgery.  Anticoagulation was held immediately post-op due to the presence of an epidural catheter.  Now epidural has been removed, and pharmacy asked to start IV heparin.  CBC WNL, no overt bleeding or complications noted. ? ?Of note, recently in April, pt requiring high doses of heparin (~ 2600 units/hr) to reach therapeutic levels.   ? ?Heparin level therapeutic at 0.4 on 2300  units/hr. Level drawn appropriately. CBC stable. No bleeding noted.  ? ?Goal of Therapy:  ?Heparin level 0.3-0.7 units/ml ?Monitor platelets by anticoagulation protocol: Yes ?  ?Plan:  ?Continue heparin rate at 2300 units/hr ?Confirmatory level in 8 hours ?Daily heparin level and CBC. ?F/u ability to transition back to oral anticoagulation eventually. ? ?Cathrine Muster, PharmD ?PGY2 Cardiology Pharmacy Resident ?Phone: [phone] ?11/30/2021  2:46 PM ? ?Please check [email or web address] for unit-specific pharmacy phone numbers. ? ? ? ?

## 2021-11-30 NOTE — Progress Notes (Addendum)
ANTICOAGULATION CONSULT NOTE ? ?Pharmacy Consult for IV heparin ?Indication: PAF, recurrent DVTs ? ?No Known Allergies ? ?Patient Measurements: ?Height: '6\' 3"'$  (190.5 cm) ?Weight: 108.9 kg (240 lb 1.3 oz) ?IBW/kg (Calculated) : 84.5 ?Heparin Dosing Weight: ~ 104 kg ? ?Vital Signs: ?Temp: 98.5 ?F (36.9 ?C) (05/14 1947) ?Temp Source: Oral (05/14 1947) ?BP: 147/108 (05/14 1947) ?Pulse Rate: 105 (05/14 1947) ? ?Labs: ?Recent Labs  ?  11/28/21 ?0345 11/29/21 ?7096 11/29/21 ?1209 11/30/21 ?2836 11/30/21 ?6294 11/30/21 ?1336 11/30/21 ?2204  ?HGB 11.1* 11.4*  --  11.1*  --   --   --   ?HCT 33.8* 35.5*  --  34.1*  --   --   --   ?PLT 166 166  --  211  --   --   --   ?HEPARINUNFRC  --  <0.10*   < >  --  0.27* 0.40 0.12*  ?CREATININE 0.94 0.89  --  0.89  --   --   --   ? < > = values in this interval not displayed.  ? ?Estimated Creatinine Clearance: 116.3 mL/min (by C-G formula based on SCr of 0.89 mg/dL). ? ? ?Medical History: ?Past Medical History:  ?Diagnosis Date  ? 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 Right 1st seen 11/14; thrombosed 11/15  ? 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 Right SFA 05/07/17 while on warfarin INR 2.9  ? 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 Right SFA 05/07/17 while on warfarin INR 2.9  ? Atrial fibrillation ([place])   ? Benign essential HTN 01/04/2012  ? Chronic anticoagulation 01/02/2013  ? Dermatofibroma of forearm 01/02/2013  ? Left side  ? DVT, lower extremity, proximal ([place]) 05/24/2013  ? Right leg femoral & popliteal 05/24/13  ? Hyperlipidemia, mixed 01/04/2012  ? Hypothyroidism   ? Pneumonia   ? 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 Primary hypercoagulable state ([place]) 10/01/2014  ? 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 Sinus bradycardia, chronic 01/04/2012  ? Superficial thrombosis of lower extremity 05/02/2012  ? ? ?Medications:  ?Infusions:  ? sodium chloride    ? heparin 2,300 Units/hr  (11/30/21 1543)  ? lactated ringers 50 mL/hr at 11/30/21 1543  ? ? ?Assessment: ?61 yo male on chronic Xarelto, s/p complicated vascular surgery (5/10).  Anticoagulation was held immediately post-op due to the presence of an epidural catheter.  Now epidural has been removed, and pharmacy asked to dose IV heparin.  ? ?Heparin level subtherapeutic at 0.12 on 2300 units/hr. Last level was at goal ? ?Goal of Therapy:  ?Heparin level 0.3-0.7 units/ml ?Monitor platelets by anticoagulation protocol: Yes ?  ?Plan:  ?-heparin bolus 2000 units x1 and increase to 2450 units/hr ?-Heparin level and CBC in am ? ?Hildred Laser, PharmD ?Clinical Pharmacist ?**Pharmacist phone directory can now be found on [email or web address] ([email or web address]).  Listed under [place]. ? ? ? ? ?

## 2021-12-01 LAB — HEPATIC FUNCTION PANEL
ALT: 68 U/L — ABNORMAL HIGH (ref 0–44)
AST: 18 U/L (ref 15–41)
Albumin: 2.2 g/dL — ABNORMAL LOW (ref 3.5–5.0)
Alkaline Phosphatase: 71 U/L (ref 38–126)
Bilirubin, Direct: 0.2 mg/dL (ref 0.0–0.2)
Indirect Bilirubin: 0.9 mg/dL (ref 0.3–0.9)
Total Bilirubin: 1.1 mg/dL (ref 0.3–1.2)
Total Protein: 5.1 g/dL — ABNORMAL LOW (ref 6.5–8.1)

## 2021-12-01 LAB — BASIC METABOLIC PANEL
Anion gap: 9 (ref 5–15)
BUN: 10 mg/dL (ref 8–23)
CO2: 26 mmol/L (ref 22–32)
Calcium: 8.4 mg/dL — ABNORMAL LOW (ref 8.9–10.3)
Chloride: 103 mmol/L (ref 98–111)
Creatinine, Ser: 0.95 mg/dL (ref 0.61–1.24)
GFR, Estimated: >60 mL/min (ref >60)
Glucose, Bld: 86 mg/dL (ref 70–99)
Potassium: 3.5 mmol/L (ref 3.5–5.1)
Sodium: 138 mmol/L (ref 135–145)

## 2021-12-01 LAB — CBC
HCT: 32.4 % — ABNORMAL LOW (ref 39.0–52.0)
Hemoglobin: 11 g/dL — ABNORMAL LOW (ref 13.0–17.0)
MCH: 31.9 pg (ref 26.0–34.0)
MCHC: 34 g/dL (ref 30.0–36.0)
MCV: 93.9 fL (ref 80.0–100.0)
Platelets: 242 10*3/uL (ref 150–400)
RBC: 3.45 MIL/uL — ABNORMAL LOW (ref 4.22–5.81)
RDW: 13.8 % (ref 11.5–15.5)
WBC: 6.2 10*3/uL (ref 4.0–10.5)
nRBC: 0 % (ref 0.0–0.2)

## 2021-12-01 LAB — HEPARIN LEVEL (UNFRACTIONATED): Heparin Unfractionated: <0.1 IU/mL — ABNORMAL LOW (ref 0.30–0.70)

## 2021-12-01 MED ORDER — FUROSEMIDE 10 MG/ML IJ SOLN: 40.0000 mg | Freq: Two times a day (BID) | INTRAMUSCULAR | Status: DC

## 2021-12-01 MED ORDER — RIVAROXABAN 20 MG PO TABS
20.0000 mg | ORAL_TABLET | Freq: Every day | ORAL | Status: DC
  Administered 2021-12-01: 20 mg via ORAL
  Filled 2021-12-01: qty 1

## 2021-12-01 MED ORDER — HYDROMORPHONE HCL 1 MG/ML IJ SOLN
0.5000 mg | INTRAMUSCULAR | Status: DC | PRN
  Administered 2021-12-01: 1 mg via INTRAVENOUS
  Administered 2021-12-01: 0.5 mg via INTRAVENOUS
  Administered 2021-12-01 – 2021-12-02 (×3): 1 mg via INTRAVENOUS
  Administered 2021-12-02: 0.5 mg via INTRAVENOUS
  Administered 2021-12-03: 1 mg via INTRAVENOUS
  Filled 2021-12-01: qty 0.5
  Filled 2021-12-01 (×7): qty 1

## 2021-12-01 MED ORDER — FUROSEMIDE 10 MG/ML IJ SOLN
40.0000 mg | Freq: Once | INTRAMUSCULAR | Status: AC
  Administered 2021-12-01: 40 mg via INTRAVENOUS
  Filled 2021-12-01: qty 4

## 2021-12-01 NOTE — Progress Notes (Signed)
Occupational Therapy Treatment ?Patient Details ?Name: [name] [name] ?MRN: [id number] ?DOB: [birth year] ?Today's Date: 12/01/2021 ? ? ?History of present illness Pt is a 61 y.o. male who presented 11/26/21 for aorto-bi-iliac bypass, right ilio-femoral bypass, right femoral-posterior tibial artery bypass, and umbilical hernia repair secondary to subacute ischemia of the R lower extremity with early tissue loss. 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  ?OT comments ? Pt making good progress with all adls tolerating standing at EOB today for adls as well as ambulating to the hallway and back with walker and +2 assist for safety due to being orthostatic.  BPs as follows: ?Supine:  130/95 ?Sitting:    119/80 ?Standing:  99/62 ?Sitting:     114/83 ?Reclined:  115/90 ? ?Pt did not c/o dizziness when on his feet but did request to sit because of not feeling well.  Pt motivated and participated well with mobility and adls. ?  ? ?Recommendations for follow up therapy are one component of a multi-disciplinary discharge planning process, led by the attending physician.  Recommendations may be updated based on patient status, additional functional criteria and insurance authorization. ?   ?Follow Up Recommendations ? Acute inpatient rehab (3hours/day)  ?  ?Assistance Recommended at Discharge Frequent or constant Supervision/Assistance  ?Patient can return home with the following ? A lot of help with walking and/or transfers;A lot of help with bathing/dressing/bathroom;Assistance with cooking/housework;Assist for transportation;Help with stairs or ramp for entrance ?  ?Equipment Recommendations ? BSC/3in1  ?  ?Recommendations for Other Services   ? ?  ?Precautions / Restrictions Precautions ?Precautions: Fall ?Precaution Comments: wound vac, IV, watch BP ?Restrictions ?Weight Bearing Restrictions: No   ? ? ?  ? ?Mobility Bed Mobility ?Overal bed mobility: Needs Assistance ?Bed Mobility: Supine to Sit ?  ?  ?Supine to sit: HOB elevated, Mod assist ?  ?  ?General bed mobility comments: Bed in egress position, needing modA to scoot hips anteriorly to lift trunk up to sit, Min guard to scoot posteriorly in bed. ?  ? ?Transfers ?Overall transfer level: Needs assistance ?Equipment used: Rolling walker (2 wheels), 2 person hand held assist ?Transfers: Sit to/from Stand ?Sit to Stand: Min assist, +2 safety/equipment ?  ?  ?  ?  ?  ?General transfer comment: Pt stood off bed with cues for hand placement from elevated bed due to pt being so tall.  +2 assist provided due to drops in BP when standing and multiple lines ?  ?  ?Balance Overall balance assessment: Needs assistance ?Sitting-balance support: Feet supported, Single extremity supported, Bilateral upper extremity supported ?Sitting balance-Leahy Scale: Good ?  ?  ?Standing balance support: Bilateral upper extremity supported, Reliant on assistive device for balance ?Standing balance-Leahy Scale: Poor ?Standing balance comment: Pt must have outside support from walker to remain standing. ?  ?  ?  ?  ?  ?  ?  ?  ?  ?  ?  ?   ? ?ADL either performed or assessed with clinical judgement  ? ?ADL Overall ADL's : Needs assistance/impaired ?  ?  ?Grooming: Wash/dry hands;Standing ?Grooming Details (indicate cue type and reason): Pt tolerated standing today. Pt with drop in BP but was not dizzy. ?  ?  ?  ?  ?  ?  ?  ?  ?Toilet Transfer: Minimal assistance;+2 for safety/equipment;Stand-pivot;BSC/3in1 ?Toilet Transfer Details (indicate cue type and reason): Pt pivoted with +2  for safety and drop in BP when standing. Pt continues with pain when standing affecting mobility. ?  ?  ?  ?  ?Functional mobility during ADLs: Minimal assistance;+2 for safety/equipment;Rolling walker (2 wheels) ?General ADL Comments: Pt limited in LE adls due to drop in BP when sitting and standing and  poor activity tolerace due to prolonged hospitalization.  Pt unable to complete or tolerate LE in prolonged standing but did stand today and ambulated past sink following with chair. ?  ? ?Extremity/Trunk Assessment Upper Extremity Assessment ?Upper Extremity Assessment: Overall WFL for tasks assessed;LUE deficits/detail ?LUE Deficits / Details: swollen L hand most likely due to IV. New IV placed in R hand ?LUE: Unable to fully assess due to pain ?LUE Coordination: WNL ?  ?Lower Extremity Assessment ?Lower Extremity Assessment: Defer to PT evaluation ?  ?  ?  ? ?Vision   ?Vision Assessment?: No apparent visual deficits ?Additional Comments: wears glasses ?  ?Perception Perception ?Perception: Within Functional Limits ?  ?Praxis Praxis ?Praxis: Intact ?  ? ?Cognition Arousal/Alertness: Awake/alert ?Behavior During Therapy: Flat affect ?Overall Cognitive Status: Within Functional Limits for tasks assessed ?  ?  ?  ?  ?  ?  ?  ?  ?  ?  ?  ?  ?  ?  ?  ?  ?General Comments: Cognition appears to be improving. Pt continues to ask for lines to be removed (unsure he knows why all need to stay in) but otherwise is following commands, cooperative and demonstrated better problem solving. ?  ?  ?   ?Exercises   ? ?  ?Shoulder Instructions   ? ? ?  ?General Comments Pt with good improvements today tolerating EOB and standing during basic adls. Pt cont to have pain but motivated to work through it.  ? ? ?Pertinent Vitals/ Pain       Pain Assessment ?Pain Assessment: Faces ?Faces Pain Scale: Hurts little more ?Pain Location: abdomen and R leg ?Pain Descriptors / Indicators: Discomfort, Grimacing, Guarding ?Pain Intervention(s): Limited activity within patient's tolerance, Monitored during session, Repositioned ? ?Home Living   ?  ?  ?  ?  ?  ?  ?  ?  ?  ?  ?  ?  ?  ?  ?  ?  ?  ?  ? ?  ?Prior Functioning/Environment    ?  ?  ?  ?   ? ?Frequency ? Min 2X/week  ? ? ? ? ?  ?Progress Toward Goals ? ?OT Goals(current goals can now be  found in the care plan section) ? Progress towards OT goals: Progressing toward goals ? ?Acute Rehab OT Goals ?Patient Stated Goal: to walk and do for myself. ?OT Goal Formulation: With patient ?Time For Goal Achievement: 12/12/21 ?Potential to Achieve Goals: Good ?ADL Goals ?Pt Will Perform Grooming: with supervision;sitting ?Pt Will Perform Upper Body Dressing: with supervision;sitting ?Pt Will Perform Lower Body Dressing: with min guard assist;sit to/from stand;with adaptive equipment ?Pt Will Transfer to Toilet: with min guard assist;ambulating;regular height toilet ?Pt/caregiver will Perform Home Exercise Program: Increased strength;Both right and left upper extremity;With Supervision  ?Plan Discharge plan remains appropriate   ? ?Co-evaluation ? ? ? PT/OT/SLP Co-Evaluation/Treatment: Yes ?  ?PT goals addressed during session: Mobility/safety with mobility ?OT goals addressed during session: ADL's and self-care ?  ? ?  ?AM-PAC OT "6 Clicks" Daily Activity     ?Outcome Measure ? ? Help from another person eating meals?: None ?Help from another person taking  care of personal grooming?: A Little ?Help from another person toileting, which includes using toliet, bedpan, or urinal?: A Lot ?Help from another person bathing (including washing, rinsing, drying)?: A Lot ?Help from another person to put on and taking off regular upper body clothing?: A Lot ?Help from another person to put on and taking off regular lower body clothing?: A Lot ?6 Click Score: 15 ? ?  ?End of Session   ? ?OT Visit Diagnosis: Unsteadiness on feet (R26.81);Muscle weakness (generalized) (M62.81);Pain;Dizziness and giddiness (R42) ?Pain - Right/Left: Right ?Pain - part of body: Leg ?  ?Activity Tolerance Patient limited by fatigue ?  ?Patient Left in chair;with call bell/phone within reach ?  ?Nurse Communication Mobility status ?  ? ?   ? ?Time: 0786-7544 ?OT Time Calculation (min): 38 min ? ?Charges: OT General Charges ?$OT Visit: 1 Visit ?OT  Treatments ?$Self Care/Home Management : 8-22 mins ? ? ?Glenford Peers ?12/01/2021, 9:10 AM ?

## 2021-12-01 NOTE — Progress Notes (Signed)
15cc of hydromorphone wasted in waste bin with Tamala Ser, RN  ?

## 2021-12-01 NOTE — Progress Notes (Signed)
Inpatient Rehab Admissions Coordinator:  ? ? I do not yet have insurance auth or a CIR bed for this Pt. Today. I will continue to follow for potential admit pending insurance auth and bed availability.  ? ?.Clemens Catholic, MS, CCC-SLP ?Rehab Admissions Coordinator  ?[phone] (celll) ?[phone] (office) ? ?

## 2021-12-01 NOTE — Progress Notes (Signed)
Physical Therapy Treatment ?Patient Details ?Name: [name] [name] ?MRN: [id number] ?DOB: 24-Oct-1960 ?Today's Date: 12/01/2021 ? ? ?History of Present Illness Pt is a 61 y.o. male who presented 11/26/21 for aorto-bi-iliac bypass, right ilio-femoral bypass, right femoral-posterior tibial artery bypass, and umbilical hernia repair secondary to subacute ischemia of the R lower extremity with early tissue loss. 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 ?  ?PT Comments  ? ? Pt received in bed agreeable to participation in therapy. Pt seen in session with OT to address increased functional mobility in setting of +orthostatics. Pt required mod assist bed mobility, min assist +2 safety sit to stand, and min assist +2 safety/chair follow amb 20' with RW. Pt on 2L O2 on arrival. Mobilized on RA with SpO2 96%. Pt in recliner at end of session with feet elevated, and remained on RA. Orthostatic BP as follows: ? ?Supine:  130/95 ?Sitting:    119/80 ?Standing:  99/62 ?Sitting:     114/83 ?Reclined:  115/90 ?   ?Recommendations for follow up therapy are one component of a multi-disciplinary discharge planning process, led by the attending physician.  Recommendations may be updated based on patient status, additional functional criteria and insurance authorization. ? ?Follow Up Recommendations ? Acute inpatient rehab (3hours/day) ?  ?  ?Assistance Recommended at Discharge Frequent or constant Supervision/Assistance  ?Patient can return home with the following Two people to help with walking and/or transfers;A lot of help with bathing/dressing/bathroom;Assistance with cooking/housework;Assist for transportation;Help with stairs or ramp for entrance ?  ?Equipment Recommendations ? Rolling walker (2 wheels);BSC/3in1;Hospital bed  ?  ?Recommendations for Other Services Rehab consult ? ? ?  ?Precautions /  Restrictions Precautions ?Precautions: Fall;Other (comment) ?Precaution Comments: wound vac, IV, watch BP ?Restrictions ?Weight Bearing Restrictions: No  ?  ? ?Mobility ? Bed Mobility ?Overal bed mobility: Needs Assistance ?Bed Mobility: Supine to Sit ?  ?  ?Supine to sit: HOB elevated, Mod assist ?  ?  ?General bed mobility comments: +rail, increased time, assist to elevate trunk and scoot to EOB ?  ? ?Transfers ?Overall transfer level: Needs assistance ?Equipment used: Rolling walker (2 wheels) ?Transfers: Sit to/from Stand ?Sit to Stand: Min assist, +2 safety/equipment, From elevated surface ?  ?  ?  ?  ?  ?General transfer comment: cues for hand placement, assist to power up ?  ? ?Ambulation/Gait ?Ambulation/Gait assistance: Min assist, +2 safety/equipment ?Gait Distance (Feet): 20 Feet ?Assistive device: Rolling walker (2 wheels) ?Gait Pattern/deviations: Step-through pattern, Decreased stride length, Trunk flexed ?Gait velocity: decreased ?Gait velocity interpretation: <1.31 ft/sec, indicative of household ambulator ?  ?General Gait Details: Pt orthostatic but still tolerated short amb distance. +2 chair follow/safety ? ? ?Stairs ?  ?  ?  ?  ?  ? ? ?Wheelchair Mobility ?  ? ?Modified Rankin (Stroke Patients Only) ?  ? ? ?  ?Balance Overall balance assessment: Needs assistance ?Sitting-balance support: Feet supported, No upper extremity supported ?Sitting balance-Leahy Scale: Good ?  ?  ?Standing balance support: Bilateral upper extremity supported, Reliant on assistive device for balance, During functional activity ?Standing balance-Leahy Scale: Poor ?  ?  ?  ?  ?  ?  ?  ?  ?  ?  ?  ?  ?  ? ?  ?Cognition Arousal/Alertness: Awake/alert ?Behavior During Therapy: Flat affect ?Overall Cognitive Status: Within Functional Limits for tasks assessed ?  ?  ?  ?  ?  ?  ?  ?  ?  ?  ?  ?  ?  ?  ?  ?  ?  ?  ?  ? ?  ?  Exercises General Exercises - Lower Extremity ?Ankle Circles/Pumps: AROM, Both, 15 reps, Seated ? ?   ?General Comments General comments (skin integrity, edema, etc.): +orthostatics but good tolerance of mobility ?  ?  ? ?Pertinent Vitals/Pain Pain Assessment ?Pain Assessment: Faces ?Faces Pain Scale: Hurts little more ?Pain Location: abdomen and R leg ?Pain Descriptors / Indicators: Discomfort, Grimacing, Guarding ?Pain Intervention(s): Limited activity within patient's tolerance, Monitored during session, Repositioned  ? ? ?Home Living   ?  ?  ?  ?  ?  ?  ?  ?  ?  ?   ?  ?Prior Function    ?  ?  ?   ? ?PT Goals (current goals can now be found in the care plan section) Acute Rehab PT Goals ?Patient Stated Goal: get better ?Progress towards PT goals: Progressing toward goals ? ?  ?Frequency ? ? ? Min 3X/week ? ? ? ?  ?PT Plan Current plan remains appropriate  ? ? ?Co-evaluation PT/OT/SLP Co-Evaluation/Treatment: Yes ?Reason for Co-Treatment: Complexity of the patient's impairments (multi-system involvement);For patient/therapist safety;To address functional/ADL transfers ?PT goals addressed during session: Mobility/safety with mobility;Balance;Proper use of DME ?OT goals addressed during session: ADL's and self-care ?  ? ?  ?AM-PAC PT "6 Clicks" Mobility   ?Outcome Measure ? Help needed turning from your back to your side while in a flat bed without using bedrails?: A Lot ?Help needed moving from lying on your back to sitting on the side of a flat bed without using bedrails?: A Lot ?Help needed moving to and from a bed to a chair (including a wheelchair)?: A Little ?Help needed standing up from a chair using your arms (e.g., wheelchair or bedside chair)?: A Little ?Help needed to walk in hospital room?: Total ?Help needed climbing 3-5 steps with a railing? : Total ?6 Click Score: 12 ? ?  ?End of Session Equipment Utilized During Treatment: Gait belt ?Activity Tolerance: Patient tolerated treatment well ?Patient left: in chair;with call bell/phone within reach ?Nurse Communication: Mobility status;Other (comment)  (BP, SpO2) ?PT Visit Diagnosis: Muscle weakness (generalized) (M62.81);Difficulty in walking, not elsewhere classified (R26.2);Pain;Unsteadiness on feet (R26.81) ?Pain - Right/Left: Right ?Pain - part of body: Leg ?  ? ? ?Time: 1610-9604 ?PT Time Calculation (min) (ACUTE ONLY): 26 min ? ?Charges:  $Gait Training: 8-22 mins          ?          ? ?Lorrin Goodell, PT  ?Office # [phone] ?Pager [phone] ? ? ? ?Lorriane Shire ?12/01/2021, 9:30 AM ? ?

## 2021-12-01 NOTE — Progress Notes (Addendum)
[facility] ? ?Subjective  - No new complaints ? ? ?Objective ?(!) 135/99 ?97 ?98.6 ?F (37 ?C) (Oral) ?18 ?97% ? ?Intake/Output Summary (Last 24 hours) at 12/01/2021 0736 ?Last data filed at 12/01/2021 0600 ?Gross per 24 hour  ?Intake 3149.41 ml  ?Output 500 ml  ?Net 2649.41 ml  ? ? ?Abdomin soft, mild distension, incision is healing well ?Right groin with incisional vac to suction no OP ?Right doppler PT, left palpable DP ?Lungs non labored breathing  ? ?Assessment/Planning: ?POD # 5 ?1) aorto-bi-iliac bypass (20x644m Dacron) ?2) right ilio-femoral bypass (861mDacron) ?3) right femoral - posterior tibial artery bypass (44m744mTFE) ?4) umbilical hernia repair ? ?Good inflow to B, incisions healing well ?He has tolerated liquids with mild nausea, no vomiting.  I will advance his diet as tolerates he will go slow.  He has had flatus.  ?Encouraged mobility cont. PT/OT.   Current recommendation is for CIR. ?HGB stable @ 11 ? ?Low urine OP 500 cc total last 24 hours, Cr is WNL 0.89.  He is + 9,500 since admission he may benefit from Lasix.  He does not appear edematous, but his urine Op has decreased since the 12th. ? ?He is on Heparin full dose for A fib with history of DVT.  He may be ready to transition back to oral anticoagulation. ?  ? ? ?  ? ?EmmRoxy Horseman/15/2023 ?7:36 AM ?-- ? ?Laboratory ?Lab Results: ?Recent Labs  ?  11/30/21 ?0541610/15/23 ?0606  ?WBC 7.7 6.2  ?HGB 11.1* 11.0*  ?HCT 34.1* 32.4*  ?PLT 211 242  ? ?BMET ?Recent Labs  ?  11/29/21 ?0549604/14/23 ?0540  ?NA 141 139  ?K 3.8 3.8  ?CL 106 103  ?CO2 25 27  ?GLUCOSE 100* 92  ?BUN 9 8  ?CREATININE 0.89 0.89  ?CALCIUM 8.6* 8.4*  ? ? ?COAG ?Lab Results  ?Component Value Date  ? INR 1.2 11/26/2021  ? INR 1.1 11/26/2021  ? INR 1.2 11/07/2021  ? PROTIME 28.8 (H) 02/04/2015  ? PROTIME 28.8 (H) 12/10/2014  ? PROTIME 36.0 (H) 08/24/2014  ? ?No results found for: PTT ? ?VASCULAR STAFF ADDENDUM: ?I have independently  interviewed and examined the patient. ?I agree with the above.  ?PCA pain control ?Mobilize as able with PT / OT  ?Advance to full liquids ?Transition to PO anticoagulation ?Lasix '40mg'$  IV x1 ? ?ThoYevonne AlineawStanford BreedD ?[facility] Phone Number: ([phone]/15/2023 8:12 AM ? ? ? ?

## 2021-12-01 NOTE — Progress Notes (Signed)
Patient refused to take [name] [name] and baby Asprin stating that "they don't work". This RN educated patient on need for [name] [name] and patient agreed to take it this one time. Patient still did not take Asprin. More education needed.  ?

## 2021-12-02 LAB — CBC
HCT: 34.4 % — ABNORMAL LOW (ref 39.0–52.0)
Hemoglobin: 11.3 g/dL — ABNORMAL LOW (ref 13.0–17.0)
MCH: 30.5 pg (ref 26.0–34.0)
MCHC: 32.8 g/dL (ref 30.0–36.0)
MCV: 93 fL (ref 80.0–100.0)
Platelets: 271 10*3/uL (ref 150–400)
RBC: 3.7 MIL/uL — ABNORMAL LOW (ref 4.22–5.81)
RDW: 13.8 % (ref 11.5–15.5)
WBC: 6.8 10*3/uL (ref 4.0–10.5)
nRBC: 0 % (ref 0.0–0.2)

## 2021-12-02 LAB — BASIC METABOLIC PANEL
Anion gap: 12 (ref 5–15)
BUN: 10 mg/dL (ref 8–23)
CO2: 24 mmol/L (ref 22–32)
Calcium: 8.4 mg/dL — ABNORMAL LOW (ref 8.9–10.3)
Chloride: 101 mmol/L (ref 98–111)
Creatinine, Ser: 0.88 mg/dL (ref 0.61–1.24)
GFR, Estimated: >60 mL/min (ref >60)
Glucose, Bld: 86 mg/dL (ref 70–99)
Potassium: 3.1 mmol/L — ABNORMAL LOW (ref 3.5–5.1)
Sodium: 137 mmol/L (ref 135–145)

## 2021-12-02 MED ORDER — NAPROXEN 250 MG PO TABS
250.0000 mg | ORAL_TABLET | Freq: Two times a day (BID) | ORAL | Status: DC
  Filled 2021-12-02 (×5): qty 1

## 2021-12-02 MED ORDER — METHOCARBAMOL 500 MG PO TABS
500.0000 mg | ORAL_TABLET | Freq: Three times a day (TID) | ORAL | Status: DC
  Filled 2021-12-02 (×3): qty 1

## 2021-12-02 MED ORDER — ACETAMINOPHEN 325 MG PO TABS
650.0000 mg | ORAL_TABLET | Freq: Four times a day (QID) | ORAL | Status: DC
Start: 2021-12-02 — End: 2021-12-03
  Administered 2021-12-03: 650 mg via ORAL
  Filled 2021-12-02 (×2): qty 2

## 2021-12-02 MED ORDER — DOCUSATE SODIUM 100 MG PO CAPS
200.0000 mg | ORAL_CAPSULE | Freq: Two times a day (BID) | ORAL | Status: DC
  Administered 2021-12-02: 200 mg via ORAL
  Filled 2021-12-02 (×3): qty 2

## 2021-12-02 MED ORDER — OXYCODONE HCL 5 MG PO TABS
5.0000 mg | ORAL_TABLET | ORAL | Status: DC | PRN
  Administered 2021-12-02 – 2021-12-03 (×2): 5 mg via ORAL
  Filled 2021-12-02 (×3): qty 1

## 2021-12-02 MED ORDER — RIVAROXABAN 20 MG PO TABS
20.0000 mg | ORAL_TABLET | Freq: Every day | ORAL | Status: DC
  Administered 2021-12-02: 20 mg via ORAL
  Filled 2021-12-02: qty 1

## 2021-12-02 MED ORDER — POTASSIUM CHLORIDE 20 MEQ PO PACK
40.0000 meq | PACK | ORAL | Status: AC
  Filled 2021-12-02 (×2): qty 2

## 2021-12-02 MED ORDER — POLYETHYLENE GLYCOL 3350 17 G PO PACK
17.0000 g | PACK | Freq: Every day | ORAL | Status: DC
  Filled 2021-12-02 (×2): qty 1

## 2021-12-02 MED ORDER — POTASSIUM CHLORIDE CRYS ER 20 MEQ PO TBCR: 40.0000 meq | EXTENDED_RELEASE_TABLET | ORAL | Status: DC

## 2021-12-02 MED ORDER — POTASSIUM CHLORIDE 10 MEQ/100ML IV SOLN
10.0000 meq | INTRAVENOUS | Status: AC
  Administered 2021-12-02 (×4): 10 meq via INTRAVENOUS
  Filled 2021-12-02 (×4): qty 100

## 2021-12-02 MED ORDER — FUROSEMIDE 10 MG/ML IJ SOLN
40.0000 mg | Freq: Once | INTRAMUSCULAR | Status: AC
  Administered 2021-12-02: 40 mg via INTRAVENOUS
  Filled 2021-12-02: qty 4

## 2021-12-02 MED ORDER — PANTOPRAZOLE SODIUM 40 MG PO TBEC: 40.0000 mg | DELAYED_RELEASE_TABLET | Freq: Every day | ORAL | Status: DC

## 2021-12-02 NOTE — Progress Notes (Addendum)
[facility] ? ?Subjective  - Feels bloated and swollen all over ? ? ?Objective ?(!) 120/96 ?94 ?98.4 ?F (36.9 ?C) (Oral) ?17 ?96% ? ?Intake/Output Summary (Last 24 hours) at 12/02/2021 0728 ?Last data filed at 12/02/2021 0352 ?Gross per 24 hour  ?Intake 323.73 ml  ?Output 1200 ml  ?Net -876.27 ml  ? ? ?Right doppler PT, left palpable DP.  Leg incision healing well ?Abdomin softer, incision is healing well.  Right groin with incisional vac in place, no OP in vac canister ?Lungs non labored breathing ?Heart irregularly irregular rhythm with chronic Afib ? ? ?Assessment/Planning: ?POD # 6 ?1) aorto-bi-iliac bypass (20x71m Dacron) ?2) right ilio-femoral bypass (843mDacron) ?3) right femoral - posterior tibial artery bypass (42m642mTFE) ?4) umbilical hernia repair ? ?Xarelto and ASA daily ?Cont incisional vac for 7-10 days D/C per Dr. HawStanford Breedending CIR and insurance auth. ?He had a BM yesterday and is tolerating full clears ?HGB stable 11.3, Cr WNL, urine OP improved wit Lasix dose > 1200 ml.  He remains + fluid load since admission 9000+.  He may benefit from repeat Lasix. ?I will discuss the plan with Dr. HawStanford [name]?I added PO oxycodone for pain control since PCA was discharged yesterday due to IV access problems.   ? ? ? ?[name] [name]/16/2023 ?7:28 AM ?-- ? ?Laboratory ?Lab Results: ?Recent Labs  ?  12/01/21 ?0606 12/02/21 ?0300  ?WBC 6.2 6.8  ?HGB 11.0* 11.3*  ?HCT 32.4* 34.4*  ?PLT 242 271  ? ?BMET ?Recent Labs  ?  12/01/21 ?0606 12/02/21 ?0300  ?NA 138 137  ?K 3.5 3.1*  ?CL 103 101  ?CO2 26 24  ?GLUCOSE 86 86  ?BUN 10 10  ?CREATININE 0.95 0.88  ?CALCIUM 8.4* 8.4*  ? ? ?COAG ?Lab Results  ?Component Value Date  ? INR 1.2 11/26/2021  ? INR 1.1 11/26/2021  ? INR 1.2 11/07/2021  ? PROTIME 28.8 (H) 02/04/2015  ? PROTIME 28.8 (H) 12/10/2014  ? PROTIME 36.0 (H) 08/24/2014  ? ?No results found for: PTT ? ?VASCULAR STAFF ADDENDUM: ?I have independently interviewed and examined the  patient. ?I agree with the above.  ?Will start multimodal oral analgesia. ?Repeat lasix today. ?Give Kdur. ?Mobilize as able. ?Pending transfer to CIR.  ?Close to ready for disposition. ? ?[name] AlineawStanford BreedD ?[facility] Phone Number: ([phone]/16/2023 9:35 AM ? ? ? ?

## 2021-12-02 NOTE — Progress Notes (Addendum)
Physical Therapy Treatment ?Patient Details ?Name: [name] [name] ?MRN: [id number] ?DOB: [birth year] ?Today's Date: 12/02/2021 ? ? ?History of Present Illness Pt is a 61 y.o. male who presented 11/26/21 for aorto-bi-iliac bypass, right ilio-femoral bypass, right femoral-posterior tibial artery bypass, and umbilical hernia repair secondary to subacute ischemia of the R lower extremity with early tissue loss. 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 ? ?  ?PT Comments  ? ? Pt received in supine, agreeable to therapy session with encouragement. Pt with slow processing and needs mod cues for safety/body mechanics due to incisional pain. Decreased carryover of safety cues within session. Pt needing up to minA +2 to perform bed mobility, transfers and short gait trial in room. Distance limited due to tachycardia, with HR max 169 bpm during gait trial ~51f with RW support (RN notified, per RN pt being given K+ supplementation). Pt BP checked and orthostatics stable, DBP slightly elevated with standing. Pt continues to benefit from PT services to progress toward functional mobility goals. Continue to recommend AIR.  ?Recommendations for follow up therapy are one component of a multi-disciplinary discharge planning process, led by the attending physician.  Recommendations may be updated based on patient status, additional functional criteria and insurance authorization. ? ?Follow Up Recommendations ? Acute inpatient rehab (3hours/day) ?  ?  ?Assistance Recommended at Discharge Frequent or constant Supervision/Assistance  ?Patient can return home with the following Two people to help with walking and/or transfers;A lot of help with bathing/dressing/bathroom;Assistance with cooking/housework;Assist for transportation;Help with stairs or ramp for entrance ?  ?Equipment Recommendations ? Rolling walker (2  wheels);BSC/3in1;Hospital bed  ?  ?Recommendations for Other Services   ? ? ?  ?Precautions / Restrictions Precautions ?Precautions: Fall;Other (comment) ?Precaution Comments: wound vac/abdominal incision, watch BP/HR ?Restrictions ?Weight Bearing Restrictions: No  ?  ? ?Mobility ? Bed Mobility ?Overal bed mobility: Needs Assistance ?Bed Mobility: Rolling, Sidelying to Sit, Sit to Supine ?Rolling: Min assist ?Sidelying to sit: Min assist, +2 for physical assistance, HOB elevated ?  ?Sit to supine: Min assist, HOB elevated, +2 for safety/equipment ?  ?General bed mobility comments: +rail, increased time, assist to elevate trunk and scoot to EOB, pt tending to ignore cues for log roll sequencing. ?  ? ?Transfers ?Overall transfer level: Needs assistance ?Equipment used: Rolling walker (2 wheels) ?Transfers: Sit to/from Stand ?Sit to Stand: From elevated surface, Min assist, +2 safety/equipment ?  ?  ?  ?  ?  ?General transfer comment: cues for hand placement each attempt and assist to power up; from elevated bed x3 trials ?  ? ?Ambulation/Gait ?Ambulation/Gait assistance: Min assist, +2 safety/equipment ?Gait Distance (Feet): 20 Feet ?Assistive device: Rolling walker (2 wheels) ?Gait Pattern/deviations: Step-through pattern, Decreased stride length, Trunk flexed ?Gait velocity: decreased ?  ?  ?General Gait Details: mobility specialist assisting with monitor/wound vac lines for safety; pt tachy to 169 bpm with exertion, RN notified. Pt denies sx during tachy episode. ? ? ? ?  ?Balance Overall balance assessment: Needs assistance ?Sitting-balance support: Feet supported, No upper extremity supported ?Sitting balance-Leahy Scale: Good ?Sitting balance - Comments: able to sit upright without UE support but tending to use single UE support due to pain/guarding ?  ?Standing balance support: Single extremity supported, Bilateral upper extremity supported ?Standing balance-Leahy Scale: Poor ?Standing balance comment: able to  stand with min guard to minA with U UE support during BP assessment, needing BUE support of  RW for gait progression in room. ?  ?  ?  ?  ?  ?  ?  ?  ?  ?  ?  ?  ? ?  ?Cognition Arousal/Alertness: Awake/alert ?Behavior During Therapy: Flat affect ?Overall Cognitive Status: Within Functional Limits for tasks assessed ?  ?  ?General Comments: Cognition appears to be improving. Pt continues to ask for lines to be removed (unsure he knows why all need to stay in) but otherwise is following commands, but decreased carryover of safety cues within session. ?  ?  ? ?  ?Exercises General Exercises - Lower Extremity ?Ankle Circles/Pumps: Other (comment) (vcs for supine LE exercises including SLR, hip abduction, heel slides and ankle pumps but pt defer to attempt during session due to fatigue and wanting to speak with RN who was entering room) ? ?  ?General Comments General comments (skin integrity, edema, etc.): BP 121/91 (100) HR 100 bpm supine; BP 120/87 (98) sitting EOB HR to 132 bpm; BP 129/110 (118) standing HR to 152 bpm. ?  ?  ? ?Pertinent Vitals/Pain Pain Assessment ?Pain Assessment: Faces ?Faces Pain Scale: Hurts even more ?Pain Location: abdomen and R leg ?Pain Descriptors / Indicators: Discomfort, Grimacing, Guarding ?Pain Intervention(s): Monitored during session, Repositioned, Limited activity within patient's tolerance  ? ? ? ?PT Goals (current goals can now be found in the care plan section) Acute Rehab PT Goals ?Patient Stated Goal: get better ?PT Goal Formulation: With patient ?Time For Goal Achievement: 12/11/21 ?Progress towards PT goals: Progressing toward goals ? ?  ?Frequency ? ? ? Min 3X/week ? ? ? ?  ?PT Plan Current plan remains appropriate  ? ? ?   ?AM-PAC PT "6 Clicks" Mobility   ?Outcome Measure ? Help needed turning from your back to your side while in a flat bed without using bedrails?: A Little ?Help needed moving from lying on your back to sitting on the side of a flat bed without using  bedrails?: A Lot (mod cues) ?Help needed moving to and from a bed to a chair (including a wheelchair)?: A Lot (mod cues) ?Help needed standing up from a chair using your arms (e.g., wheelchair or bedside chair)?: A Lot (mod cues) ?Help needed to walk in hospital room?: A Little ?Help needed climbing 3-5 steps with a railing? : Total ?6 Click Score: 13 ? ?  ?End of Session Equipment Utilized During Treatment: Gait belt ?Activity Tolerance: Treatment limited secondary to medical complications (Comment) (pt tachycardia to 169 bpm with limited ambulation in room) ?Patient left: in bed;with call bell/phone within reach;with bed alarm set;with nursing/sitter in room;Other (comment) (pt heels floated) ?Nurse Communication: Mobility status;Other (comment) (BP, tachycardia) ?PT Visit Diagnosis: Muscle weakness (generalized) (M62.81);Difficulty in walking, not elsewhere classified (R26.2);Pain;Unsteadiness on feet (R26.81) ?Pain - Right/Left: Right ?Pain - part of body: Leg ?  ? ? ?Time: 4403-4742 ?PT Time Calculation (min) (ACUTE ONLY): 23 min ? ?Charges:  $Gait Training: 8-22 mins ?$Therapeutic Activity: 8-22 mins          ?          ? ?Dragan Tamburrino P., PTA ?Acute Rehabilitation Services ?Secure Chat Preferred 9a-5:30pm ?Office: [phone]  ? ? ?Jemeka Wagler M Deema Juncaj ?12/02/2021, 5:03 PM ? ?

## 2021-12-02 NOTE — Progress Notes (Signed)
Inpatient Rehab Admissions Coordinator:  ?  ? I do not have a CIR bed for this Pt. Today. Continue to await insurance auth. Will continue to follow for potential admit pending insurance auth and bed availability.  ?  ?Clemens Catholic, MS, CCC-SLP ?Rehab Admissions Coordinator  ?[phone] (celll) ?[phone] (office) ?   ?  ?  ?  ? ? ? ?

## 2021-12-02 NOTE — Consult Note (Signed)
? ?  [facility] CM Inpatient Consult ? ? ?12/02/2021 ? ?BRONDON WANN ?01/12/1961 ?[id number] ? ?Managed Medicaid: [employer] ? ?Primary Care Provider:  [name], MD, [facility] Internal Medicine, this provider is listed for the Transition of Care calls and follow up. ? ?Patient screened for readmission hospitalization to assess for potential Managed Medicaid team service needs for post hospital transition.  Review of patient's medical record reveals patient is being recommended for potential inpatient rehabilitation from PT/OT.   ?Encounters reveals that the patient has been active with [facility] Palliative team. ? ?Came by to speak with patient and he was sound asleep after calling his name, did not awaken. ? ?Plan:  Continue to follow progress and disposition to assess for post hospital care management needs.   ? ?For questions contact:  ? ?Natividad Brood, RN BSN CCM ?[facility] Liaison ? [phone] business mobile phone ?Toll free office [phone]  ?Fax number: [phone] ?[email or web address]  ?  ? ?

## 2021-12-02 NOTE — Progress Notes (Signed)
Pt refused oral potassium measures pill and powder were refused. ? ?Verbal Order for 66mq IV to be given ?IV potassium  ? ?Daughter, HSloane [phone] like to get a phone call from vascular. States she has a few questions ?

## 2021-12-03 ENCOUNTER — Inpatient Hospital Stay (HOSPITAL_COMMUNITY): Payer: Medicaid Other

## 2021-12-03 ENCOUNTER — Inpatient Hospital Stay (HOSPITAL_COMMUNITY)
Admission: RE | Admit: 2021-12-03 | Discharge: 2021-12-10 | DRG: 945 | Disposition: A | Discharge status: Home Health | Payer: Medicaid Other | Source: Intra-hospital | Attending: Physical Medicine and Rehabilitation | Admitting: Physical Medicine and Rehabilitation

## 2021-12-03 DIAGNOSIS — Z79899 Other long term (current) drug therapy: Secondary | ICD-10-CM | POA: Diagnosis not present

## 2021-12-03 DIAGNOSIS — Z7901 Long term (current) use of anticoagulants: Secondary | ICD-10-CM

## 2021-12-03 DIAGNOSIS — F17200 Nicotine dependence, unspecified, uncomplicated: Secondary | ICD-10-CM | POA: Diagnosis not present

## 2021-12-03 DIAGNOSIS — E039 Hypothyroidism, unspecified: Secondary | ICD-10-CM | POA: Diagnosis present

## 2021-12-03 DIAGNOSIS — F1210 Cannabis abuse, uncomplicated: Secondary | ICD-10-CM

## 2021-12-03 DIAGNOSIS — I998 Other disorder of circulatory system: Secondary | ICD-10-CM

## 2021-12-03 DIAGNOSIS — Q251 Coarctation of aorta: Secondary | ICD-10-CM | POA: Diagnosis not present

## 2021-12-03 DIAGNOSIS — E876 Hypokalemia: Secondary | ICD-10-CM | POA: Diagnosis not present

## 2021-12-03 DIAGNOSIS — E782 Mixed hyperlipidemia: Secondary | ICD-10-CM | POA: Diagnosis present

## 2021-12-03 DIAGNOSIS — Z823 Family history of stroke: Secondary | ICD-10-CM | POA: Diagnosis not present

## 2021-12-03 DIAGNOSIS — Z79890 Hormone replacement therapy (postmenopausal): Secondary | ICD-10-CM

## 2021-12-03 DIAGNOSIS — F17210 Nicotine dependence, cigarettes, uncomplicated: Secondary | ICD-10-CM | POA: Diagnosis present

## 2021-12-03 DIAGNOSIS — Z923 Personal history of irradiation: Secondary | ICD-10-CM | POA: Diagnosis not present

## 2021-12-03 DIAGNOSIS — I10 Essential (primary) hypertension: Secondary | ICD-10-CM | POA: Diagnosis present

## 2021-12-03 DIAGNOSIS — C090 Malignant neoplasm of tonsillar fossa: Secondary | ICD-10-CM

## 2021-12-03 DIAGNOSIS — C099 Malignant neoplasm of tonsil, unspecified: Secondary | ICD-10-CM | POA: Diagnosis not present

## 2021-12-03 DIAGNOSIS — Z86718 Personal history of other venous thrombosis and embolism: Secondary | ICD-10-CM | POA: Diagnosis not present

## 2021-12-03 DIAGNOSIS — K5900 Constipation, unspecified: Secondary | ICD-10-CM | POA: Diagnosis present

## 2021-12-03 DIAGNOSIS — Z85828 Personal history of other malignant neoplasm of skin: Secondary | ICD-10-CM

## 2021-12-03 DIAGNOSIS — R1310 Dysphagia, unspecified: Secondary | ICD-10-CM | POA: Diagnosis not present

## 2021-12-03 DIAGNOSIS — I480 Paroxysmal atrial fibrillation: Secondary | ICD-10-CM | POA: Diagnosis not present

## 2021-12-03 DIAGNOSIS — D62 Acute posthemorrhagic anemia: Secondary | ICD-10-CM | POA: Diagnosis not present

## 2021-12-03 DIAGNOSIS — Z7982 Long term (current) use of aspirin: Secondary | ICD-10-CM

## 2021-12-03 DIAGNOSIS — E785 Hyperlipidemia, unspecified: Secondary | ICD-10-CM

## 2021-12-03 DIAGNOSIS — Z7189 Other specified counseling: Secondary | ICD-10-CM

## 2021-12-03 DIAGNOSIS — E8770 Fluid overload, unspecified: Secondary | ICD-10-CM | POA: Diagnosis present

## 2021-12-03 DIAGNOSIS — R5381 Other malaise: Principal | ICD-10-CM | POA: Diagnosis present

## 2021-12-03 DIAGNOSIS — I4891 Unspecified atrial fibrillation: Secondary | ICD-10-CM | POA: Diagnosis not present

## 2021-12-03 DIAGNOSIS — R748 Abnormal levels of other serum enzymes: Secondary | ICD-10-CM | POA: Diagnosis not present

## 2021-12-03 DIAGNOSIS — K5903 Drug induced constipation: Secondary | ICD-10-CM | POA: Diagnosis not present

## 2021-12-03 DIAGNOSIS — Z85818 Personal history of malignant neoplasm of other sites of lip, oral cavity, and pharynx: Secondary | ICD-10-CM | POA: Diagnosis not present

## 2021-12-03 DIAGNOSIS — Z95828 Presence of other vascular implants and grafts: Secondary | ICD-10-CM

## 2021-12-03 LAB — CBC
HCT: 37.1 % — ABNORMAL LOW (ref 39.0–52.0)
Hemoglobin: 12.5 g/dL — ABNORMAL LOW (ref 13.0–17.0)
MCH: 31.3 pg (ref 26.0–34.0)
MCHC: 33.7 g/dL (ref 30.0–36.0)
MCV: 92.8 fL (ref 80.0–100.0)
Platelets: 300 10*3/uL (ref 150–400)
RBC: 4 MIL/uL — ABNORMAL LOW (ref 4.22–5.81)
RDW: 13.8 % (ref 11.5–15.5)
WBC: 8.7 10*3/uL (ref 4.0–10.5)
nRBC: 0 % (ref 0.0–0.2)

## 2021-12-03 LAB — BASIC METABOLIC PANEL
Anion gap: 11 (ref 5–15)
BUN: 13 mg/dL (ref 8–23)
CO2: 28 mmol/L (ref 22–32)
Calcium: 8.6 mg/dL — ABNORMAL LOW (ref 8.9–10.3)
Chloride: 100 mmol/L (ref 98–111)
Creatinine, Ser: 1.05 mg/dL (ref 0.61–1.24)
GFR, Estimated: >60 mL/min (ref >60)
Glucose, Bld: 100 mg/dL — ABNORMAL HIGH (ref 70–99)
Potassium: 3.6 mmol/L (ref 3.5–5.1)
Sodium: 139 mmol/L (ref 135–145)

## 2021-12-03 MED ORDER — PROCHLORPERAZINE 25 MG RE SUPP: 12.5000 mg | Freq: Four times a day (QID) | RECTAL | Status: DC | PRN

## 2021-12-03 MED ORDER — RIVAROXABAN 20 MG PO TABS
20.0000 mg | ORAL_TABLET | Freq: Every day | ORAL | Status: DC
  Administered 2021-12-03 – 2021-12-09 (×7): 20 mg via ORAL
  Filled 2021-12-03 (×7): qty 1

## 2021-12-03 MED ORDER — FLEET ENEMA 7-19 GM/118ML RE ENEM: 1.0000 | ENEMA | Freq: Once | RECTAL | Status: DC | PRN

## 2021-12-03 MED ORDER — DIPHENHYDRAMINE HCL 12.5 MG/5ML PO ELIX
12.5000 mg | ORAL_SOLUTION | Freq: Four times a day (QID) | ORAL | Status: DC | PRN
  Administered 2021-12-03: 25 mg via ORAL
  Filled 2021-12-03 (×2): qty 10

## 2021-12-03 MED ORDER — PANTOPRAZOLE SODIUM 40 MG PO TBEC
40.0000 mg | DELAYED_RELEASE_TABLET | Freq: Every day | ORAL | Status: DC
  Administered 2021-12-04: 40 mg via ORAL
  Filled 2021-12-03 (×4): qty 1

## 2021-12-03 MED ORDER — TRAZODONE HCL 50 MG PO TABS: 25.0000 mg | ORAL_TABLET | Freq: Every evening | ORAL | Status: DC | PRN

## 2021-12-03 MED ORDER — METOPROLOL SUCCINATE ER 25 MG PO TB24
25.0000 mg | ORAL_TABLET | Freq: Every day | ORAL | Status: DC
  Administered 2021-12-04 – 2021-12-09 (×6): 25 mg via ORAL
  Filled 2021-12-03 (×7): qty 1

## 2021-12-03 MED ORDER — PROCHLORPERAZINE EDISYLATE 10 MG/2ML IJ SOLN: 5.0000 mg | Freq: Four times a day (QID) | INTRAMUSCULAR | Status: DC | PRN

## 2021-12-03 MED ORDER — ACETAMINOPHEN 325 MG PO TABS: 325.0000 mg | ORAL_TABLET | ORAL | Status: DC | PRN

## 2021-12-03 MED ORDER — CHLORHEXIDINE GLUCONATE CLOTH 2 % EX PADS
6.0000 | MEDICATED_PAD | Freq: Every day | CUTANEOUS | Status: DC
  Administered 2021-12-04 – 2021-12-05 (×2): 6 via TOPICAL

## 2021-12-03 MED ORDER — METHOCARBAMOL 500 MG PO TABS
500.0000 mg | ORAL_TABLET | Freq: Four times a day (QID) | ORAL | Status: DC | PRN
Start: 2021-12-03 — End: 2021-12-10

## 2021-12-03 MED ORDER — DOCUSATE SODIUM 100 MG PO CAPS
200.0000 mg | ORAL_CAPSULE | Freq: Two times a day (BID) | ORAL | Status: DC
  Administered 2021-12-03 – 2021-12-05 (×3): 200 mg via ORAL
  Filled 2021-12-03 (×10): qty 2

## 2021-12-03 MED ORDER — LEVOTHYROXINE SODIUM 25 MCG PO TABS
25.0000 ug | ORAL_TABLET | Freq: Every day | ORAL | Status: DC
  Administered 2021-12-04 – 2021-12-10 (×7): 25 ug via ORAL
  Filled 2021-12-03 (×7): qty 1

## 2021-12-03 MED ORDER — GUAIFENESIN-DM 100-10 MG/5ML PO SYRP: 5.0000 mL | ORAL_SOLUTION | Freq: Four times a day (QID) | ORAL | Status: DC | PRN

## 2021-12-03 MED ORDER — BISACODYL 10 MG RE SUPP: 10.0000 mg | Freq: Every day | RECTAL | Status: DC | PRN

## 2021-12-03 MED ORDER — PROPOFOL 1000 MG/100ML IV EMUL
INTRAVENOUS | Status: AC
  Filled 2021-12-03: qty 100

## 2021-12-03 MED ORDER — PROCHLORPERAZINE MALEATE 5 MG PO TABS: 5.0000 mg | ORAL_TABLET | Freq: Four times a day (QID) | ORAL | Status: DC | PRN

## 2021-12-03 MED ORDER — ALUM & MAG HYDROXIDE-SIMETH 200-200-20 MG/5ML PO SUSP
30.0000 mL | ORAL | Status: DC | PRN
Start: 2021-12-03 — End: 2021-12-10
  Filled 2021-12-03: qty 30

## 2021-12-03 MED ORDER — POLYETHYLENE GLYCOL 3350 17 G PO PACK
17.0000 g | PACK | Freq: Two times a day (BID) | ORAL | Status: DC
  Filled 2021-12-03 (×9): qty 1

## 2021-12-03 MED ORDER — OXYCODONE HCL 5 MG PO TABS
5.0000 mg | ORAL_TABLET | ORAL | Status: DC | PRN
  Administered 2021-12-03 – 2021-12-04 (×2): 5 mg via ORAL
  Filled 2021-12-03 (×2): qty 1

## 2021-12-03 MED ORDER — PHENYLEPHRINE HCL-NACL 20-0.9 MG/250ML-% IV SOLN
INTRAVENOUS | Status: AC
  Filled 2021-12-03: qty 500

## 2021-12-03 MED ORDER — PHENOL 1.4 % MT LIQD
1.0000 | OROMUCOSAL | Status: DC | PRN
  Filled 2021-12-03: qty 177

## 2021-12-03 MED ORDER — ENSURE ENLIVE PO LIQD
237.0000 mL | Freq: Three times a day (TID) | ORAL | Status: DC
  Administered 2021-12-05 – 2021-12-06 (×2): 237 mL via ORAL

## 2021-12-03 MED ORDER — POLYETHYLENE GLYCOL 3350 17 G PO PACK
17.0000 g | PACK | Freq: Every day | ORAL | Status: DC | PRN
Start: 2021-12-03 — End: 2021-12-10

## 2021-12-03 MED ORDER — ACETAMINOPHEN 325 MG PO TABS
650.0000 mg | ORAL_TABLET | Freq: Four times a day (QID) | ORAL | Status: DC
  Administered 2021-12-04: 650 mg via ORAL
  Filled 2021-12-03 (×12): qty 2

## 2021-12-03 MED ORDER — ASPIRIN 81 MG PO TBEC
81.0000 mg | DELAYED_RELEASE_TABLET | Freq: Every day | ORAL | Status: DC
Start: 2021-12-04 — End: 2021-12-10
  Administered 2021-12-04 – 2021-12-09 (×5): 81 mg via ORAL
  Filled 2021-12-03 (×7): qty 1

## 2021-12-03 MED ORDER — ATORVASTATIN CALCIUM 10 MG PO TABS
20.0000 mg | ORAL_TABLET | Freq: Every day | ORAL | Status: DC
  Administered 2021-12-04 – 2021-12-09 (×6): 20 mg via ORAL
  Filled 2021-12-03 (×7): qty 2

## 2021-12-03 NOTE — H&P (Signed)
?  ?Physical Medicine and Rehabilitation Admission H&P ?  ?  ?CC: Functional deficits ?  ?  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 [name] and Dr. Carlis Abbott .  Post op with hypotension requiring Neo as well as abdominal pain w/nausea and ABLA requiring 2 units PRBC and 2 units FFP.  He was maintained on IV heparin and transitioned to Xarelto on 05/16. Fluid overload treated with IV diuresis. ABLA is stable. He is tolerating liquid diet but continues to report abdominal pain, constipation and productive cough. PT/OT has been working with patient and he continues to be limited by incisional pain, weakness, poor safety awareness as well as tachycardia limiting ambulation-->HR up to 169 with short distances. CIR recommended due to functional decline.  He has used dilaudid and oxycodone for his incisional pain.   He reports chronic limited jaw movement due to treatment prior neoplasm of tonsillar fossa.  ?  ?  ?Review of Systems  ?Constitutional:  Negative for fever.  ?HENT:  Negative for hearing loss.   ?Eyes:  Positive for blurred vision (declining vision--needs eye exam).  ?Respiratory:  Positive for cough and sputum production (chronic secretions--used suction even at home). Negative for shortness of breath.   ?Cardiovascular:  Negative for chest pain and palpitations.  ?Gastrointestinal:  Positive for constipation. Negative for heartburn.  ?Genitourinary:  Negative for dysuria.  ?Musculoskeletal:  Negative for myalgias.   ?Neurological:  Positive for sensory change (right foot w/numbness and tingling) and weakness. Negative for dizziness and headaches.  ?  ?  ?    ?Past Medical History:  ?Diagnosis Date  ? Aneurysm artery, popliteal ([place]) 10/01/2014  ?  Right 1st seen 11/14; thrombosed 11/15  ? 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[place]) 05/25/2017  ?  Right SFA 05/07/17 while on warfarin INR 2.9  ? 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[place]) 05/25/2017  ?  Right SFA 05/07/17 while on warfarin INR 2.9  ? Atrial fibrillation ([place])    ? Benign essential HTN 01/04/2012  ? Chronic anticoagulation 01/02/2013  ? Dermatofibroma of forearm 01/02/2013  ?  Left side  ? DVT, lower extremity, proximal ([place]) 05/24/2013  ?  Right leg femoral & popliteal 05/24/13  ? Hyperlipidemia, mixed 01/04/2012  ? Hypothyroidism    ? Pneumonia    ? 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 Primary hypercoagulable state ([place]) 10/01/2014  ? 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[place])    ? Sinus bradycardia, chronic 01/04/2012  ? Superficial thrombosis of lower extremity 05/02/2012  ?  ?  ?     ?Past Surgical History:  ?Procedure Laterality Date  ? ABDOMINAL AORTOGRAM W/LOWER EXTREMITY Right 11/10/2021  ?  Procedure: ABDOMINAL AORTOGRAM W/LOWER EXTREMITY;  Surgeon: Waynetta Sandy, MD;  Location: [facility];  Service: Cardiovascular;  Laterality: Right;  ? AORTA - BILATERAL FEMORAL ARTERY BYPASS GRAFT Bilateral 11/26/2021  ?  Procedure: AORTO- BIILIAC ARTERY  BYPASS GRAFT USING HEMASHIELD 20 X 11MM, RIGHT ILIO-FEMORAL BYPASS  UMBILICAL HERNIA REPAIR;  Surgeon: [name],  Yevonne Aline, MD;  Location: [facility];  Service: Vascular;  Laterality: Bilateral;  ? APPLICATION OF WOUND VAC Right 11/26/2021  ?  Procedure: APPLICATION OF WOUND VAC;  Surgeon: Cherre Robins, MD;  Location: [facility];  Service: Vascular;  Laterality: Right;  ? DIRECT LARYNGOSCOPY Left 10/19/2018  ?  Procedure: DIRECT LARYNGOSCOPY;  Surgeon: Leta Baptist, MD;   Location: [place];  Service: ENT;  Laterality: Left;  ? FEMORAL-TIBIAL BYPASS GRAFT Right 11/26/2021  ?  Procedure: RIGHT FEMORALTIBIAL  ARTERY BYPASS GRAFT USING PROPATEN GRAFT;  Surgeon: Cherre Robins, MD;  Location: [facility];  Service: Vascular;  Laterality: Right;  ? IR IMAGING GUIDED PORT INSERTION   11/04/2018  ? IR REMOVAL TUN ACCESS W/ PORT W/O FL MOD SED   05/01/2019  ? TONSILLECTOMY Left 10/19/2018  ?  Procedure: BIOPSY OF LEFT TONSIL;  Surgeon: Leta Baptist, MD;  Location: [facility];  Service: ENT;  Laterality: Left;  ?  ?  ?     ?Family History  ?Problem Relation Age of Onset  ? Stroke Father    ?  ?  ?Social History:  Lives with girlfriend and has been limited for couple of months due to leg pain. He reports that he has been smoking cigarettes about 2 packs/week.  He has a 15.00 pack-year smoking history. He has never used smokeless tobacco. He reports that he does not currently use alcohol. He reports current drug use. Drug: Marijuana. ?  ?  ?Allergies: No Known Allergies ?  ?  ?      ?Medications Prior to Admission  ?Medication Sig Dispense Refill  ? atorvastatin (LIPITOR) 40 MG tablet Take 1 tablet (40 mg total) by mouth daily. 30 tablet 3  ? cyclobenzaprine (FLEXERIL) 10 MG tablet TAKE 1 TABLET BY MOUTH THREE TIMES A DAY AS NEEDED FOR MUSCLE SPASMS 30 tablet 0  ? enoxaparin (LOVENOX) 120 MG/0.8ML injection Inject 0.72 mLs (108 mg total) into the skin every 12 (twelve) hours for 9 days. 16 mL 0  ? levothyroxine (SYNTHROID) 25 MCG tablet Take 1 tablet (25 mcg total) by mouth daily before breakfast. Take on empty stomach w/ water, 1 hour before other meds or food. 90 tablet 1  ? metoprolol succinate (TOPROL-XL) 25 MG 24 hr tablet TAKE 1 TABLET (25 MG TOTAL) BY MOUTH DAILY. (Patient taking differently: Take 25 mg by mouth every evening.) 90 tablet 1  ? ondansetron (ZOFRAN-ODT) 4 MG disintegrating tablet Take 1 tablet (4 mg total) by mouth every 8 (eight) hours as needed for  nausea or vomiting. 20 tablet 0  ? oxyCODONE (OXY IR/ROXICODONE) 5 MG immediate release tablet Take 1 tablet (5 mg total) by mouth every 6 (six) hours as needed for moderate pain. 30 tablet 0  ? senna-docusate (SENOKOT-S) 8.6-50 MG tablet Take 1 tablet by mouth daily. (Patient taking differently: Take 1 tablet by mouth daily as needed for moderate constipation.) 90 tablet 5  ? traZODone (DESYREL) 50 MG tablet Take 1 tablet (50 mg total) by mouth at bedtime. (Patient taking differently: Take 50 mg by mouth at bedtime as needed for sleep.) 90 tablet 1  ? vitamin E 180 MG (400 UNITS) capsule Take 400 IU daily x 1 week, then 400 IU BID 60 capsule 5  ? aspirin EC 81 MG EC tablet Take 1 tablet (81 mg total) by mouth daily. Swallow whole. 30 tablet 11  ? atorvastatin (LIPITOR) 20 MG tablet Take 1 tablet (20 mg total) by mouth  daily. (Patient not taking: Reported on 11/24/2021) 90 tablet 11  ? glycopyrrolate (ROBINUL) 2 MG tablet Take 1 tablet (2 mg total) by mouth 3 (three) times daily as needed (mouth secretions). (Patient not taking: Reported on 11/07/2021) 30 tablet 0  ? rivaroxaban (XARELTO) 20 MG TABS tablet Take 1 tablet (20 mg total) by mouth daily with supper. (Patient not taking: Reported on 11/24/2021) 90 tablet 3  ? sodium fluoride (PREVIDENT 5000 PLUS) 1.1 % CREA dental cream Apply to tooth brush. Brush teeth for 2 minutes. Spit out excess. DO NOT rinse afterwards. Repeat nightly. (Patient not taking: Reported on 11/07/2021) 1 Tube prn  ?  ?  ?  ?  ?Home: ?Home Living ?Family/patient expects to be discharged to:: Private residence ?Living Arrangements: Spouse/significant other ?Available Help at Discharge: Family, Available 24 hours/day ?Type of Home: House ?Home Access: Stairs to enter ?Entrance Stairs-Number of Steps: 4 ?Entrance Stairs-Rails: Can reach both ?Home Layout: Two level ?Alternate Level Stairs-Number of Steps: full flight.  Patient can stay downstairs, but shower/tub is up stairs.  Half bath on main  level. ?Bathroom Shower/Tub: Tub/shower unit ?Bathroom Toilet: Standard ?Bathroom Accessibility: Yes ?Home Equipment: None ?Additional Comments: Has long handled sponge ? Lives With: Significant other ?  ?Functiona

## 2021-12-03 NOTE — Progress Notes (Addendum)
[facility] ? ?Subjective  - No new complaints other than pills are to large to swallow. ? ? ?Objective ?(!) 107/91 ?100 ?97.7 ?F (36.5 ?C) (Oral) ?20 ?94% ? ?Intake/Output Summary (Last 24 hours) at 12/03/2021 0841 ?Last data filed at 12/02/2021 2320 ?Gross per 24 hour  ?Intake 63.19 ml  ?Output 850 ml  ?Net -786.81 ml  ? ? ?Right LE with doppler signal PT, left palpable DP ?Incision on the right LE is healing well ?Abdominal incision healing well ?Lungs non labored breathing ? ? ?Assessment/Planning: ?POD # 6 ?1) aorto-bi-iliac bypass (20x69m [place]) ?2) right ilio-femoral bypass (861mDacron) ?3) right femoral - posterior tibial artery bypass (41m24mTFE) ?4) umbilical hernia repair ? ?Xarelto and ASA have been cont. ?Incisional vac may be removed right groin at post op day 7 ?Lasix x 2 doses with improved urine OP and stable CR WNL and has helped with vol over load. ?Plan for discharge today to CIR for cont. Rehab. ? ? ?EmmRoxy Horseman/17/2023 ?8:41 AM ?-- ? ?Laboratory ?Lab Results: ?Recent Labs  ?  12/02/21 ?0300 12/03/21 ?0211  ?WBC 6.8 8.7  ?HGB 11.3* 12.5*  ?HCT 34.4* 37.1*  ?PLT 271 300  ? ?BMET ?Recent Labs  ?  12/02/21 ?0300 12/03/21 ?0211  ?NA 137 139  ?K 3.1* 3.6  ?CL 101 100  ?CO2 24 28  ?GLUCOSE 86 100*  ?BUN 10 13  ?CREATININE 0.88 1.05  ?CALCIUM 8.4* 8.6*  ? ? ?COAG ?Lab Results  ?Component Value Date  ? INR 1.2 11/26/2021  ? INR 1.1 11/26/2021  ? INR 1.2 11/07/2021  ? PROTIME 28.8 (H) 02/04/2015  ? PROTIME 28.8 (H) 12/10/2014  ? PROTIME 36.0 (H) 08/24/2014  ? ?No results found for: PTT ? ?VASCULAR STAFF ADDENDUM: ?I have independently interviewed and examined the patient. ?I agree with the above.  ?Remove incisional VAC today / tomorrow. ?Plan transfer to CIR today. ?Will continue to follow. ? ?ThoYevonne AlineawStanford BreedD ?[facility] Phone Number: ([phone]/17/2023 12:39 PM ? ? ? ?

## 2021-12-03 NOTE — Progress Notes (Signed)
PMR Admission Coordinator Pre-Admission Assessment ?  ?Patient: [name] [name] is an 61 y.o., male ?MRN: [id number] ?DOB: 03/14/1961 ?Height: '6\' 3"'$  (190.5 cm) ?Weight: 108.9 kg ?  ?Insurance Information ?HMO:     PPO:      PCP:      IPA:      80/20:      OTHER:  ?PRIMARY: [employer]      Policy#: ?[id number]       Subscriber: Pt ?CM Name: Chinita Greenland       Phone#: [phone]     Fax#: [phone] [place] with [employer] called 12/02/21 with a 7 day approval. Pt. Approved until 5/23 with updates due 5/23   ?Pre-Cert#: [id number]      Employer:  ?Benefits:  Phone #: [phone]     Name:  ?Irene Shipper Date: 04/19/2020 - 12/17/2024  ?Deductible: $0 - does not have  ?OOP Max:?$0 - does not have  ?CIR: 100% coverage  ?SNF: 100% coverage  ?Outpatient:?$4 co-pay; limited to 27 visits/cal yr (27 remaining)  ?Home Health: 100% coverage; limited by medical necessity review  ?DME: 100% coverage;?limited by medical necessity review  ?Providers: in network  ?SECONDARY:       Policy#:      Phone#:  ?  ?Financial Counselor:       Phone#:  ?  ?The ?Data Collection Information Summary? for patients in Inpatient Rehabilitation Facilities with attached ?Privacy Act [place] Records? was provided and verbally reviewed with: Patient ?  ?Emergency Contact Information ?Contact Information   ?  ?  Name Relation Home Work Mobile  ?  Poe,Lisa Significant other [phone]      ?  Vasiliy, Mccarry Daughter     [phone]  ?  ?   ?  ?  ?Current Medical History  ?Patient Admitting Diagnosis: RLE ischemia ?History of Present Illness: Pt is a 61 y.o. 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 was seen by PT/OT who recommended CIR to assist return to PLOF.  ?  ?Patient's medical record from [facility] has been reviewed by the rehabilitation admission coordinator and physician. ?  ?Past Medical History  ?    ?Past Medical History:  ?Diagnosis Date  ? 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  Right 1st seen 11/14; thrombosed 11/15  ? 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  Right SFA 05/07/17 while on warfarin INR 2.9  ? 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  Right SFA 05/07/17 while on warfarin INR 2.9  ? Atrial fibrillation ([place])    ? Benign essential HTN 01/04/2012  ? Chronic anticoagulation 01/02/2013  ? Dermatofibroma of forearm 01/02/2013  ?  Left side  ? DVT, lower extremity, proximal ([place]) 05/24/2013  ?  Right leg femoral & popliteal 05/24/13  ? Hyperlipidemia, mixed 01/04/2012  ? Hypothyroidism    ? Pneumonia    ? 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 Primary hypercoagulable state ([place]) 10/01/2014  ? 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[place])    ? Sinus bradycardia, chronic 01/04/2012  ? Superficial thrombosis of lower extremity 05/02/2012  ?  ?  ?Has the patient had major  surgery during 100 days prior to admission? Yes ?  ?Family History   ?family history includes Stroke in his father. ?  ?Current Medications ?  ?Current Facility-Administered Medications:  ?  0.9 %  sodium chloride infusion (Manually program via Guardrails IV Fluids), , Intravenous, Once, Kersey, Emma M, PA-C ?  0.9 %  sodium chloride infusion (Manually program via Guardrails IV Fluids), , Intravenous, Once, Laurence Slate M, PA-C ?  0.9 %  sodium chloride infusion, 500 mL, Intravenous, Once PRN, Laurence Slate M, PA-C ?  acetaminophen (TYLENOL) 160 MG/5ML solution 325-650 mg, 325-650 mg, Oral, Q4H PRN, Ulyses Amor, PA-C, 650 mg at 11/27/21 2056 ?  acetaminophen  (TYLENOL) tablet 325-650 mg, 325-650 mg, Oral, Q4H PRN **OR** acetaminophen (TYLENOL) suppository 325-650 mg, 325-650 mg, Rectal, Q4H PRN, Theda Sers, Emma M, PA-C ?  aspirin EC tablet 81 mg, 81 mg, Oral, Daily, Laurence Slate M, PA-C, 81 mg at 11/29/21 0940 ?  atorvastatin (LIPITOR) tablet 20 mg, 20 mg, Oral, Daily, Laurence Slate M, PA-C, 20 mg at 11/29/21 0940 ?  bisacodyl (DULCOLAX) suppository 10 mg, 10 mg, Rectal, Daily PRN, Ulyses Amor, PA-C ?  Chlorhexidine Gluconate Cloth 2 % PADS 6 each, 6 each, Topical, Daily, Ulyses Amor, [place], 6 each at 11/29/21 5188 ?  diphenhydrAMINE (BENADRYL) injection 12.5 mg, 12.5 mg, Intravenous, Q6H PRN **OR** diphenhydrAMINE (BENADRYL) 12.5 MG/5ML elixir 12.5 mg, 12.5 mg, Oral, Q6H PRN, Laurence Slate M, PA-C, 12.5 mg at 11/28/21 2243 ?  heparin ADULT infusion 100 units/mL (25000 units/231m), 2,300 Units/hr, Intravenous, Continuous, Rudisill, TJodean Lima RPH, Last Rate: 23 mL/hr at 11/30/21 0652, 2,300 Units/hr at 11/30/21 04166?  hydrALAZINE (APRESOLINE) injection 5 mg, 5 mg, Intravenous, Q20 Min PRN, CLaurence SlateM, PA-C ?  HYDROmorphone (DILAUDID) 1 mg/mL PCA injection, , Intravenous, Q4H, Collins, Emma M, PA-C, 1.5 mg at 11/30/21 00630?  labetalol (NORMODYNE) injection 10 mg, 10 mg, Intravenous, Q10 min PRN, CLaurence SlateM, PA-C ?  lactated ringers infusion, , Intravenous, Continuous, CUlyses Amor PA-C, Last Rate: 100 mL/hr at 11/29/21 1400, Infusion Verify at 11/29/21 1400 ?  levothyroxine (SYNTHROID) tablet 25 mcg, 25 mcg, Oral, Q0600, CUlyses Amor PA-C, 25 mcg at 11/30/21 01601?  metoprolol succinate (TOPROL-XL) 24 hr tablet 25 mg, 25 mg, Oral, Daily, CLaurence SlateM, PA-C, 25 mg at 11/29/21 0940 ?  metoprolol tartrate (LOPRESSOR) injection 2-5 mg, 2-5 mg, Intravenous, Q2H PRN, CTheda Sers Emma M, PA-C ?  morphine (PF) 2 MG/ML injection 2-5 mg, 2-5 mg, Intravenous, Q1H PRN, CLaurence SlateM, PA-C, 2 mg at 11/28/21 0609 ?  naloxone ([facility] injection 0.4 mg, 0.4 mg,  Intravenous, PRN **AND** sodium chloride flush (NS) 0.9 % injection 9 mL, 9 mL, Intravenous, PRN, CTheda Sers Emma M, PA-C ?  ondansetron ([facility] injection 4 mg, 4 mg, Intravenous, Q6H PRN, CLaurence SlateM, PA-C, 4 mg at 11/29/21 0940 ?  pantoprazole (PROTONIX) injection 40 mg, 40 mg, Intravenous, QHS, Collins, Emma M, PA-C, 40 mg at 11/29/21 2215 ?  phenol (CHLORASEPTIC) mouth spray 1 spray, 1 spray, Mouth/Throat, PRN, CTheda Sers Emma M, PA-C ?  potassium chloride SA (KLOR-CON M) CR tablet 20-40 mEq, 20-40 mEq, Oral, Daily PRN, CLaurence SlateM, PA-C ?  traZODone (DESYREL) tablet 50 mg, 50 mg, Oral, QHS, Collins, Emma M, PA-C, 50 mg at 11/28/21 2243 ?  ?Patients Current Diet:  ?Diet Order   ?  ?         ?    Diet clear liquid Room  service appropriate? Yes; Fluid consistency: Thin  Diet effective now       ?  ?  ?   ?  ?  ?   ?  ?  ?Precautions / Restrictions ?Precautions ?Precautions: Fall ?Precaution Comments: A-line, wound vac, foley cath, Watch BP and O2 sats ?Restrictions ?Weight Bearing Restrictions: No ?Other Position/Activity Restrictions: a-line  ?  ?Has the patient had 2 or more falls or a fall with injury in the past year? Yes ?  ?Prior Activity Level ?Community (5-7x/wk): Pt active in the community PTA ?  ?Prior Functional Level ?Self Care: Did the patient need help bathing, dressing, using the toilet or eating? Independent ?  ?Indoor Mobility: Did the patient need assistance with walking from room to room (with or without device)? Independent ?  ?Stairs: Did the patient need assistance with internal or external stairs (with or without device)? Needed some help ?  ?Functional Cognition: Did the patient need help planning regular tasks such as shopping or remembering to take medications? Independent ?  ?Patient Information ?Are you of Hispanic, Latino/a,or Spanish origin?: A. No, not of Hispanic, Latino/a, or Spanish origin ?What is your race?: A. White ?Do you need or want an interpreter to communicate with a  doctor or health care staff?: 0. No ?  ?Patient's Response To:  ?Health Literacy and Transportation ?Is the patient able to respond to health literacy and transportation needs?: Yes ?Health Literacy - How often

## 2021-12-03 NOTE — Progress Notes (Signed)
Inpatient Rehabilitation Admission Medication Review by a Pharmacist ? ?A complete drug regimen review was completed for this patient to identify any potential clinically significant medication issues. ? ?High Risk Drug Classes Is patient taking? Indication by Medication  ?Antipsychotic Yes Compazine- N/V  ?Anticoagulant Yes Xarelto- AF, Hx DVT  ?Antibiotic No   ?Opioid Yes oxyIR- acute pain  ?Antiplatelet Yes Aspirin- CVA prophylaxis  ?Hypoglycemics/insulin No   ?Vasoactive Medication Yes Toprol- hypertension  ?Chemotherapy No   ?Other Yes Lipitor- HLD ?Synthroid- hypothyroidism ?Protonix- GERD ?Robaxin- muscle spasms ?Trazodone- sleep  ? ? ? ?Type of Medication Issue Identified Description of Issue Recommendation(s)  ?Drug Interaction(s) (clinically significant) ?    ?Duplicate Therapy ?    ?Allergy ?    ?No Medication Administration End Date ?    ?Incorrect Dose ?    ?Additional Drug Therapy Needed ?    ?Significant med changes from prior encounter (inform family/care partners about these prior to discharge).    ?Other ? PTA meds: ?Flexeril ?Vitamin E Restart PTA meds when and if clinically necessary during CIR admission or at time of discharge  ? ? ?Clinically significant medication issues were identified that warrant physician communication and completion of prescribed/recommended actions by midnight of the next day:  No ? ?Time spent performing this drug regimen review (minutes):  30 ? ? ?Earle Reome BS, PharmD, BCPS ?Clinical Pharmacist ?12/03/2021 1:06 PM ? ?Contact: [phone] after 3 PM ? ?"Be curious, not judgmental..." -Jamal Maes ?

## 2021-12-03 NOTE — H&P (Signed)
? ? ?Physical Medicine and Rehabilitation Admission H&P ? ?  ?CC: Functional deficits ? ? 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 [name] and Dr. Carlis Abbott .  Post op with hypotension requiring Neo as well as abdominal pain w/nausea and ABLA requiring 2 units PRBC and 2 units FFP.  He was maintained on IV heparin and transitioned to Xarelto on 05/16. Fluid overload treated with IV diuresis. ABLA is stable. He is tolerating liquid diet but continues to report abdominal pain, constipation and productive cough. PT/OT has been working with patient and he continues to be limited by incisional pain, weakness, poor safety awareness as well as tachycardia limiting ambulation-->HR up to 169 with short distances. CIR recommended due to functional decline.  He has used dilaudid and oxycodone for his incisional pain.   He reports chronic limited jaw movement due to treatment prior neoplasm of tonsillar fossa.  ? ? ?Review of Systems  ?Constitutional:  Negative for fever.  ?HENT:  Negative for hearing loss.   ?Eyes:  Positive for blurred vision (declining vision--needs eye exam).  ?Respiratory:  Positive for cough and sputum production (chronic secretions--used suction even at home). Negative for shortness of breath.   ?Cardiovascular:  Negative for chest pain and palpitations.  ?Gastrointestinal:  Positive for constipation. Negative for heartburn.  ?Genitourinary:  Negative for dysuria.  ?Musculoskeletal:  Negative for myalgias.   ?Neurological:  Positive for sensory change (right foot w/numbness and tingling) and weakness. Negative for dizziness and headaches.  ? ? ?Past Medical History:  ?Diagnosis Date  ? Aneurysm artery, popliteal ([place]) 10/01/2014  ? Right 1st seen 11/14; thrombosed 11/15  ? 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[place]) 05/25/2017  ? Right SFA 05/07/17 while on warfarin INR 2.9  ? 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[place]) 05/25/2017  ? Right SFA 05/07/17 while on warfarin INR 2.9  ? Atrial fibrillation ([place])   ? Benign essential HTN 01/04/2012  ? Chronic anticoagulation 01/02/2013  ? Dermatofibroma of forearm 01/02/2013  ? Left side  ? DVT, lower extremity, proximal ([place]) 05/24/2013  ? Right leg femoral & popliteal 05/24/13  ? Hyperlipidemia, mixed 01/04/2012  ? Hypothyroidism   ? Pneumonia   ? 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 Primary hypercoagulable state ([place]) 10/01/2014  ? 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[place])   ? Sinus bradycardia, chronic 01/04/2012  ? Superficial thrombosis of lower extremity 05/02/2012  ? ? ?Past Surgical History:  ?Procedure Laterality Date  ? ABDOMINAL AORTOGRAM W/LOWER EXTREMITY Right 11/10/2021  ? Procedure: ABDOMINAL AORTOGRAM W/LOWER EXTREMITY;  Surgeon: Waynetta Sandy, MD;  Location: [facility];  Service: Cardiovascular;  Laterality: Right;  ? AORTA - BILATERAL FEMORAL ARTERY BYPASS GRAFT Bilateral 11/26/2021  ? Procedure: AORTO- BIILIAC ARTERY  BYPASS GRAFT USING HEMASHIELD 20 X 11MM, RIGHT ILIO-FEMORAL BYPASS  UMBILICAL HERNIA REPAIR;  Surgeon: Cherre Robins, MD;  Location: [facility];  Service: Vascular;  Laterality: Bilateral;  ? APPLICATION OF WOUND VAC Right 11/26/2021  ? Procedure: APPLICATION OF WOUND VAC;  Surgeon: Cherre Robins, MD;  Location: [facility];  Service: Vascular;  Laterality: Right;  ? DIRECT LARYNGOSCOPY Left 10/19/2018  ? Procedure: DIRECT LARYNGOSCOPY;  Surgeon: Leta Baptist, MD;  Location: [facility];  Service: ENT;  Laterality: Left;  ? FEMORAL-TIBIAL BYPASS GRAFT Right 11/26/2021  ? Procedure: RIGHT FEMORALTIBIAL  ARTERY BYPASS GRAFT USING PROPATEN GRAFT;  Surgeon: Cherre Robins, MD;  [facility];  Service: Vascular;  Laterality: Right;  ? IR IMAGING GUIDED PORT INSERTION  11/04/2018  ? IR REMOVAL TUN ACCESS W/ PORT W/O FL MOD SED  05/01/2019  ? TONSILLECTOMY Left 10/19/2018  ? Procedure: BIOPSY OF LEFT TONSIL;  Surgeon: Leta Baptist, MD;  Location: [facility];  Service: ENT;  Laterality: Left;  ? ? ?Family History  ?Problem Relation Age of Onset  ? Stroke Father   ? ? ?Social History:  Lives with girlfriend and has been limited for couple of months due to leg pain. He reports that he has been smoking cigarettes about 2 packs/week.  He has a 15.00 pack-year smoking history. He has never used smokeless tobacco. He reports that he does not currently use alcohol. He reports current drug use. Drug: Marijuana. ? ? ?Allergies: No Known Allergies ? ? ?Medications Prior to Admission  ?Medication Sig Dispense Refill  ? atorvastatin (LIPITOR) 40 MG tablet Take 1 tablet (40 mg total) by mouth daily. 30 tablet 3  ? cyclobenzaprine (FLEXERIL) 10 MG tablet TAKE 1 TABLET BY MOUTH THREE TIMES A DAY AS NEEDED FOR MUSCLE SPASMS 30 tablet 0  ? enoxaparin (LOVENOX) 120 MG/0.8ML injection Inject 0.72 mLs (108 mg total) into the skin every 12 (twelve) hours for 9 days. 16 mL 0  ? levothyroxine (SYNTHROID) 25 MCG tablet Take 1 tablet (25 mcg total) by mouth daily before breakfast. Take on empty stomach w/ water, 1 hour before other meds or food. 90 tablet 1  ? metoprolol succinate (TOPROL-XL) 25 MG 24 hr tablet TAKE 1 TABLET (25 MG TOTAL) BY MOUTH DAILY. (Patient taking differently: Take 25 mg by mouth every evening.) 90 tablet 1  ? ondansetron (ZOFRAN-ODT) 4 MG disintegrating tablet Take 1 tablet (4 mg total) by mouth every 8 (eight) hours as needed for nausea or vomiting. 20 tablet 0  ? oxyCODONE (OXY  IR/ROXICODONE) 5 MG immediate release tablet Take 1 tablet (5 mg total) by mouth every 6 (six) hours as needed for moderate pain. 30 tablet 0  ? senna-docusate (SENOKOT-S) 8.6-50 MG tablet Take 1 tablet by mouth daily. (Patient taking differently: Take 1 tablet by mouth daily as needed for moderate constipation.) 90 tablet 5  ? traZODone (DESYREL) 50 MG tablet Take 1 tablet (50 mg total) by mouth at bedtime. (Patient taking differently: Take 50 mg by mouth at bedtime as needed for sleep.) 90 tablet 1  ? vitamin E 180 MG (400 UNITS) capsule Take 400 IU daily x 1 week, then 400 IU BID 60 capsule 5  ? aspirin EC 81 MG EC tablet Take 1 tablet (81 mg total) by mouth daily. Swallow whole. 30 tablet 11  ? atorvastatin (LIPITOR) 20 MG tablet Take 1 tablet (20 mg total) by mouth daily. (Patient not taking: Reported on 11/24/2021) 90 tablet 11  ? glycopyrrolate (ROBINUL) 2 MG tablet Take 1 tablet (2 mg total) by mouth 3 (three) times daily as needed (mouth secretions). (Patient not taking: Reported on 11/07/2021) 30 tablet 0  ? rivaroxaban (XARELTO) 20 MG TABS tablet Take 1 tablet (20 mg  total) by mouth daily with supper. (Patient not taking: Reported on 11/24/2021) 90 tablet 3  ? sodium fluoride (PREVIDENT 5000 PLUS) 1.1 % CREA dental cream Apply to tooth brush. Brush teeth for 2 minutes. Spit out excess. DO NOT rinse afterwards. Repeat nightly. (Patient not taking: Reported on 11/07/2021) 1 Tube prn  ? ? ? ? ?Home: ?Home Living ?Family/patient expects to be discharged to:: Private residence ?Living Arrangements: Spouse/significant other ?Available Help at Discharge: Family, Available 24 hours/day ?Type of Home: House ?Home Access: Stairs to enter ?Entrance Stairs-Number of Steps: 4 ?Entrance Stairs-Rails: Can reach both ?Home Layout: Two level ?Alternate Level Stairs-Number of Steps: full flight.  Patient can stay downstairs, but shower/tub is up stairs.  Half bath on main level. ?Bathroom Shower/Tub: Tub/shower unit ?Bathroom  Toilet: Standard ?Bathroom Accessibility: Yes ?Home Equipment: None ?Additional Comments: Has long handled sponge ? Lives With: Significant other ?  ?Functional History: ?Prior Function ?Prior Level of Function : Independe

## 2021-12-03 NOTE — Progress Notes (Signed)
Inpatient Rehab Admissions Coordinator:  ° °I have a CIR bed for this Pt. Today. RN may call report to [phone]. ° °Laela Deviney, MS, CCC-SLP °Rehab Admissions Coordinator  °[phone] (celll) °[phone] (office) ° °

## 2021-12-03 NOTE — TOC Transition Note (Signed)
Transition of Care (TOC) - CM/SW Discharge Note ?Marvetta Gibbons Therapist, sports, BSN ?Transitions of Care ?Unit 4E- RN Case Manager ?See Treatment Team for direct phone #  ? ? ?Patient Details  ?Name: [name] [name] ?MRN: [id number] ?Date of Birth: Apr 20, 1961 ? ?Transition of Care (TOC) CM/SW Contact:  ?Dahlia Client, Romeo Rabon, RN ?Phone Number: ?12/03/2021, 1:25 PM ? ? ?Clinical Narrative:    ?Notified by [facility] re[facility] that pt has bed available today for admission and insurance auth for INPT rehab stay.  ?Pt has been cleared for transition to [facility] rehab later today. Pt is agreeable to INPT rehab stay.  ?No further TOC needs noted.  ? ? ?Final next level of care: [place] ?Barriers to Discharge: No Barriers Identified ? ? ?Patient Goals and CMS Choice ?  ? INPT rehab ?  ? ?Discharge Placement ?  ?           ?  ? [facility] re[facility] ?  ?  ? ?Discharge Plan and Services ?  ?  ?Post Acute Care Choice: IP Rehab          ?DME Arranged: N/A ?DME Agency: NA ?  ?  ?  ?HH Arranged: NA ?[place] Agency: NA ?  ?  ?  ? ?Social Determinants of Health (SDOH) Interventions ?  ? ? ?Readmission Risk Interventions ? ?  12/03/2021  ?  1:25 PM  ?Readmission Risk Prevention Plan  ?Transportation Screening Complete  ?Home Care Screening Complete  ?Medication Review (RN CM) Complete  ? ? ? ? ? ?

## 2021-12-03 NOTE — Progress Notes (Signed)
Inpatient Rehabilitation Admission Medication Review by a Pharmacist  A complete drug regimen review was completed for this patient to identify any potential clinically significant medication issues.  High Risk Drug Classes Is patient taking? Indication by Medication  Antipsychotic Yes Compazine- N/V  Anticoagulant Yes Xarelto- AF, Hx DVT  Antibiotic No   Opioid Yes oxyIR- acute pain  Antiplatelet Yes Aspirin- CVA prophylaxis  Hypoglycemics/insulin No   Vasoactive Medication Yes Toprol- hypertension  Chemotherapy No   Other Yes Lipitor- HLD Synthroid- hypothyroidism Protonix- GERD Robaxin- muscle spasms Trazodone- sleep     Type of Medication Issue Identified Description of Issue Recommendation(s)  Drug Interaction(s) (clinically significant)     Duplicate Therapy     Allergy     No Medication Administration End Date     Incorrect Dose     Additional Drug Therapy Needed     Significant med changes from prior encounter (inform family/care partners about these prior to discharge).    Other  PTA meds: Flexeril Vitamin E Restart PTA meds when and if clinically necessary during CIR admission or at time of discharge    Clinically significant medication issues were identified that warrant physician communication and completion of prescribed/recommended actions by midnight of the next day:  No  Time spent performing this drug regimen review (minutes):  30   Lashawnta Burgert BS, PharmD, BCPS Clinical Pharmacist 12/04/2021 7:14 AM  Contact: [phone] after 3 PM  "Be curious, not judgmental..." -Jamal Maes

## 2021-12-04 ENCOUNTER — Encounter (HOSPITAL_COMMUNITY): Payer: Self-pay | Admitting: Physical Medicine and Rehabilitation

## 2021-12-04 ENCOUNTER — Other Ambulatory Visit: Payer: Self-pay

## 2021-12-04 DIAGNOSIS — D62 Acute posthemorrhagic anemia: Secondary | ICD-10-CM | POA: Diagnosis not present

## 2021-12-04 DIAGNOSIS — R748 Abnormal levels of other serum enzymes: Secondary | ICD-10-CM | POA: Diagnosis not present

## 2021-12-04 DIAGNOSIS — R5381 Other malaise: Secondary | ICD-10-CM

## 2021-12-04 DIAGNOSIS — E876 Hypokalemia: Secondary | ICD-10-CM

## 2021-12-04 DIAGNOSIS — I998 Other disorder of circulatory system: Secondary | ICD-10-CM

## 2021-12-04 LAB — CBC WITH DIFFERENTIAL/PLATELET
Abs Immature Granulocytes: 0.03 10*3/uL (ref 0.00–0.07)
Basophils Absolute: 0 10*3/uL (ref 0.0–0.1)
Basophils Relative: 0 %
Eosinophils Absolute: 0.3 10*3/uL (ref 0.0–0.5)
Eosinophils Relative: 4 %
HCT: 35.3 % — ABNORMAL LOW (ref 39.0–52.0)
Hemoglobin: 11.7 g/dL — ABNORMAL LOW (ref 13.0–17.0)
Immature Granulocytes: 0 %
Lymphocytes Relative: 10 %
Lymphs Abs: 0.7 10*3/uL (ref 0.7–4.0)
MCH: 30.4 pg (ref 26.0–34.0)
MCHC: 33.1 g/dL (ref 30.0–36.0)
MCV: 91.7 fL (ref 80.0–100.0)
Monocytes Absolute: 0.8 10*3/uL (ref 0.1–1.0)
Monocytes Relative: 11 %
Neutro Abs: 5.5 10*3/uL (ref 1.7–7.7)
Neutrophils Relative %: 75 %
Platelets: 301 10*3/uL (ref 150–400)
RBC: 3.85 MIL/uL — ABNORMAL LOW (ref 4.22–5.81)
RDW: 13.7 % (ref 11.5–15.5)
WBC: 7.4 10*3/uL (ref 4.0–10.5)
nRBC: 0 % (ref 0.0–0.2)

## 2021-12-04 LAB — COMPREHENSIVE METABOLIC PANEL
ALT: 31 U/L (ref 0–44)
AST: 15 U/L (ref 15–41)
Albumin: 2.5 g/dL — ABNORMAL LOW (ref 3.5–5.0)
Alkaline Phosphatase: 59 U/L (ref 38–126)
Anion gap: 9 (ref 5–15)
BUN: 13 mg/dL (ref 8–23)
CO2: 27 mmol/L (ref 22–32)
Calcium: 8.6 mg/dL — ABNORMAL LOW (ref 8.9–10.3)
Chloride: 103 mmol/L (ref 98–111)
Creatinine, Ser: 0.84 mg/dL (ref 0.61–1.24)
GFR, Estimated: >60 mL/min (ref >60)
Glucose, Bld: 102 mg/dL — ABNORMAL HIGH (ref 70–99)
Potassium: 2.9 mmol/L — ABNORMAL LOW (ref 3.5–5.1)
Sodium: 139 mmol/L (ref 135–145)
Total Bilirubin: 1.2 mg/dL (ref 0.3–1.2)
Total Protein: 5.3 g/dL — ABNORMAL LOW (ref 6.5–8.1)

## 2021-12-04 LAB — BASIC METABOLIC PANEL
Anion gap: 8 (ref 5–15)
BUN: 13 mg/dL (ref 8–23)
CO2: 28 mmol/L (ref 22–32)
Calcium: 8.7 mg/dL — ABNORMAL LOW (ref 8.9–10.3)
Chloride: 102 mmol/L (ref 98–111)
Creatinine, Ser: 0.85 mg/dL (ref 0.61–1.24)
GFR, Estimated: >60 mL/min (ref >60)
Glucose, Bld: 100 mg/dL — ABNORMAL HIGH (ref 70–99)
Potassium: 3.3 mmol/L — ABNORMAL LOW (ref 3.5–5.1)
Sodium: 138 mmol/L (ref 135–145)

## 2021-12-04 LAB — MAGNESIUM: Magnesium: 1.7 mg/dL (ref 1.7–2.4)

## 2021-12-04 MED ORDER — POTASSIUM CHLORIDE 10 MEQ/100ML IV SOLN
10.0000 meq | INTRAVENOUS | Status: DC
  Filled 2021-12-04 (×4): qty 100

## 2021-12-04 MED ORDER — POTASSIUM CHLORIDE 10 MEQ/100ML IV SOLN
10.0000 meq | INTRAVENOUS | Status: DC
  Filled 2021-12-04 (×6): qty 100

## 2021-12-04 MED ORDER — POTASSIUM CHLORIDE 20 MEQ PO PACK
40.0000 meq | PACK | ORAL | Status: AC
  Administered 2021-12-04 (×3): 40 meq via ORAL
  Filled 2021-12-04 (×3): qty 2

## 2021-12-04 MED ORDER — PHENYLEPHRINE HCL-NACL 20-0.9 MG/250ML-% IV SOLN
INTRAVENOUS | Status: AC
Start: 2021-12-04 — End: ?
  Filled 2021-12-04: qty 500

## 2021-12-04 MED ORDER — PROPOFOL 1000 MG/100ML IV EMUL
INTRAVENOUS | Status: AC
Start: 2021-12-04 — End: ?
  Filled 2021-12-04: qty 100

## 2021-12-04 NOTE — Progress Notes (Signed)
PROGRESS NOTE   Subjective/Complaints:  Reports pain is well controlled at this time. He wants his diet upgraded.      Review of Systems  Constitutional:  Negative for chills and fever.  Respiratory:  Negative for shortness of breath.   Cardiovascular:  Negative for chest pain.  Gastrointestinal:  Negative for abdominal pain, diarrhea, nausea and vomiting.     Objective:   DG Chest 2 View  Result Date: 12/03/2021 CLINICAL DATA:  Cough EXAM: CHEST - 2 VIEW COMPARISON:  11/27/2021 FINDINGS: Cardiac enlargement. Negative for heart failure. Left lower lobe airspace disease unchanged. Right lung clear.  No significant pleural effusion. IMPRESSION: No interval change. Left lower lobe atelectasis or infiltrate unchanged. Electronically Signed   By: Franchot Gallo M.D.   On: 12/03/2021 18:52   Recent Labs    12/03/21 0211 12/04/21 0533  WBC 8.7 7.4  HGB 12.5* 11.7*  HCT 37.1* 35.3*  PLT 300 301   Recent Labs    12/03/21 0211 12/04/21 0533  NA 139 139  K 3.6 2.9*  CL 100 103  CO2 28 27  GLUCOSE 100* 102*  BUN 13 13  CREATININE 1.05 0.84  CALCIUM 8.6* 8.6*    Intake/Output Summary (Last 24 hours) at 12/04/2021 1426 Last data filed at 12/04/2021 1003 Gross per 24 hour  Intake 280 ml  Output 1 ml  Net 279 ml        Physical Exam: Vital Signs Blood pressure (!) 130/103, pulse 97, temperature 97.7 F (36.5 C), resp. rate 18, height '6\' 3"'$  (1.905 m), weight 112.2 kg, SpO2 98 %.    General: Alert and oriented x 3, No apparent distress HEENT: Head is normocephalic, atraumatic, oral mucosa pink and moist, poor dentition, limited jaw movement. Wearing glasses Neck: Supple  Heart: Irregularly irregular, no murmurs noted Chest: CTA bilaterally without wheezes, rales, or rhonchi; no distress Abdomen: Soft, minimal tenderness,  mildly distended, bowel sounds positive. Large midline abdomina incision without signs of  infection R groin wound Vac, no drainage noted Extremities:  No cyanosis, Large calf incision CDI, 1+ edema Psych: Pt's affect is appropriate. Skin: warm and dry , R leg calf incision Clean and intact without signs of breakdown Neuro:  CN 2-12 intact, alert and oriented, follows commands, memory normal, sensation decreased in RLE to light touch Musculoskeletal:  Strength 5/5 in B/L UE, 4+-5/5 LLE strength  Hip flexion and Knee extension 4/5 and APF and DF 4-/5   Assessment/Plan: 1. Functional deficits which require 3+ hours per day of interdisciplinary therapy in a comprehensive inpatient rehab setting. Physiatrist is providing close team supervision and 24 hour management of active medical problems listed below. Physiatrist and rehab team continue to assess barriers to discharge/monitor patient progress toward functional and medical goals  Care Tool:  Bathing  Bathing activity did not occur: Refused (wife does/ assist with everything) Body parts bathed by patient: Right arm, Left arm, Chest, Abdomen, Front perineal area, Buttocks, Right upper leg, Left upper leg, Face   Body parts bathed by helper: Right lower leg, Left lower leg     Bathing assist Assist Level: Contact Guard/Touching assist     Upper Body Dressing/Undressing Upper  body dressing Upper body dressing/undressing activity did not occur (including orthotics):  (wife assist with everything) What is the patient wearing?: Hospital gown only    Upper body assist Assist Level: Contact Guard/Touching assist    Lower Body Dressing/Undressing Lower body dressing    Lower body dressing activity did not occur: N/A What is the patient wearing?: Underwear/pull up     Lower body assist Assist for lower body dressing: Minimal Assistance - Patient > 75%     Toileting Toileting    Toileting assist Assist for toileting: Contact Guard/Touching assist     Transfers Chair/bed transfer  Transfers assist     Chair/bed  transfer assist level: Contact Guard/Touching assist     Locomotion Ambulation   Ambulation assist      Assist level: Contact Guard/Touching assist Assistive device: Walker-rolling Max distance: 39f   Walk 10 feet activity   Assist     Assist level: Contact Guard/Touching assist Assistive device: Walker-rolling   Walk 50 feet activity   Assist    Assist level: Contact Guard/Touching assist Assistive device: Walker-rolling    Walk 150 feet activity   Assist Walk 150 feet activity did not occur: Safety/medical concerns (fatigue)         Walk 10 feet on uneven surface  activity   Assist Walk 10 feet on uneven surfaces activity did not occur: Safety/medical concerns         Wheelchair     Assist Is the patient using a wheelchair?: Yes Type of Wheelchair: Manual    Wheelchair assist level: Supervision/Verbal cueing Max wheelchair distance: 513f   Wheelchair 50 feet with 2 turns activity    Assist        Assist Level: Supervision/Verbal cueing   Wheelchair 150 feet activity     Assist  Wheelchair 150 feet activity did not occur: Safety/medical concerns (fatigue)       Blood pressure (!) 130/103, pulse 97, temperature 97.7 F (36.5 C), resp. rate 18, height '6\' 3"'$  (1.905 m), weight 112.2 kg, SpO2 98 %.  1. Functional deficits and debility secondary to subacute ischemia of the R lower extremity s/p aorto-bi-iliac bypass, right ilio-femoral bypass, right femoral-posterior tibial artery bypass, and umbilical hernia repair              -patient may not shower             -ELOS/Goals: 12-14 days             -OT/OT Eval   -SLP eval order placed 2.  PAF/DVTs/Antithrombotics: -Anticoagulation:  Pharmaceutical: Xarelto resumed 05/16             -antiplatelet therapy: ASA 3. Pain Management: Tylenol TID in addition to oxycodone prn 4. Mood: LCSW to follow for evaluation and support.              -antipsychotic agents: N/A 5.  Neuropsych: This patient is capable of making decisions on his own behalf. 6. Skin/Wound Care: Incisional VAC to stay in place 7-10 days per Dr. HaStanford Breed--Routine pressure relief measures.              --Add ensure TID for supplement.              -Continue to monitor surgical incisions  -Dr haLuan Pullingollowing, appreciate assitance 7. Fluids/Electrolytes/Nutrition:  Monitor I/O and for signs of overload. 8. PAF: Monitor HR TID and for any symptoms with activity-->has been up to 160's             --  On metoprolol 25 mg/day, continue 9. Fluid overload: Treated with lasix X 2 doses as well as runs of KCL for hypokalemia.  --Will order daily weights.  --recheck Mg level in am.  10. ABLA: Recheck CBC in am.              -5/18 Hemoglobin 11.7, stable follow 11. Abnormal LFTs: Resolving ALT 291-->68 and on statin. --5/18 ALT 31, resolved 12. Recent PNA/ ongoing Cough: Will recheck CXR to monitor for clearance. 13. Constipation: Will increase miralax to BID.    14. Tobacco use Advised to discontinue 15. Marijuana use. Advised to discontinue 16. Hypokalemia, asymptomatic   -He declined IV, Will  do 3 doses KCL packets, recheck labs 17. Trismus and poor dentition  -Will place SLP consult for assistance with diet choice and trismus therapy                LOS: 1 days A FACE TO FACE EVALUATION WAS PERFORMED  Jennye Boroughs 12/04/2021, 2:26 PM

## 2021-12-04 NOTE — Progress Notes (Signed)
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   Primary hypercoagulable state ([place]) 10/01/2014   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[facility])    Sinus bradycardia, chronic 01/04/2012   Superficial thrombosis of lower extremity 05/02/2012   Past Surgical History:  Past Surgical History:  Procedure Laterality Date   ABDOMINAL AORTOGRAM W/LOWER EXTREMITY Right 11/10/2021   Procedure: ABDOMINAL AORTOGRAM W/LOWER EXTREMITY;  Surgeon: Waynetta Sandy, MD;  Location: [facility];  Service: Cardiovascular;  Laterality: Right;   AORTA - BILATERAL FEMORAL ARTERY BYPASS GRAFT Bilateral 11/26/2021   Procedure: AORTO- BIILIAC ARTERY  BYPASS GRAFT USING HEMASHIELD 20 X 11MM, RIGHT ILIO-FEMORAL BYPASS  UMBILICAL HERNIA REPAIR;  Surgeon: Cherre Robins, MD;  Location: [facility];  Service: Vascular;  Laterality: Bilateral;   APPLICATION OF WOUND VAC Right  11/26/2021   Procedure: APPLICATION OF WOUND VAC;  Surgeon: Cherre Robins, MD;  Location: [place];  Service: Vascular;  Laterality: Right;   DIRECT LARYNGOSCOPY Left 10/19/2018   Procedure: DIRECT LARYNGOSCOPY;  Surgeon: Leta Baptist, MD;  Location: [facility];  Service: ENT;  Laterality: Left;   FEMORAL-TIBIAL BYPASS GRAFT Right 11/26/2021   Procedure: RIGHT FEMORALTIBIAL  ARTERY BYPASS GRAFT USING PROPATEN GRAFT;  Surgeon: Cherre Robins, MD;  Location: [facility];  Service: Vascular;  Laterality: Right;   IR IMAGING GUIDED PORT INSERTION  11/04/2018   IR REMOVAL TUN ACCESS W/ PORT W/O FL MOD SED  05/01/2019   TONSILLECTOMY Left 10/19/2018   Procedure: BIOPSY OF LEFT TONSIL;  Surgeon: Leta Baptist, MD;  Location: [facility];  Service: ENT;  Laterality: Left;   Social History:  reports that he has been smoking cigarettes. He has a 15.00 pack-year smoking history. He has never used smokeless tobacco. He reports that he does not currently use alcohol. He reports current drug use. Drug: Marijuana.  Family / Support Systems Marital Status: Divorced Patient Roles: Partner, Parent Spouse/Significant Other: [name] [phone] Children: Haley-daughter [phone] Anticipated Caregiver: [name] Ability/Limitations of Caregiver: can assist but does work two times per [employer] Caregiver Availability: Other (Comment) (May be alone some while [name] works) Family Dynamics: Close with [name] and daughter, pt is one who is very independent and does not like to depend upon others. He feels he will be mod/i and be fine to be alone while [name] works  Social History Preferred language: English Religion: Christian Reformed Cultural Background: No issues Education: [place] - How often  do you need to have someone help you when you read instructions, pamphlets, or other written material from your doctor or pharmacy?: Sometimes Writes: Yes Employment Status: Disabled Public relations account executive  Issues: No issues Guardian/Conservator: none-according to MD pt is capable of making his own decisions while here. [name] will try to be here daily to provide support   Abuse/Neglect Abuse/Neglect Assessment Can Be Completed: Yes Physical Abuse: Denies Verbal Abuse: Denies Sexual Abuse: Denies Exploitation of patient/patient's resources: Denies Self-Neglect: Denies  Patient response to: Social Isolation - How often do you feel lonely or isolated from those around you?: Never  Emotional Status Pt's affect, behavior and adjustment status: Pt is  wanting to do well and be home soon. His main concern is his wounds healing and wants someone to look at his abdominal wound. He tends to do what he wants to do he admits to this also. Hopefully we can get him mod/i so safe when home Recent Psychosocial Issues: other health issues Psychiatric History: No history may benefit from seeing neuro-psych while here due to non-compliance and multiple medical issues Substance Abuse History: Tobacco aware of the health risks and have not decided if plans to quit although he has tonsil cancer  Patient / Family Perceptions, Expectations & Goals Pt/Family understanding of illness & functional limitations: Pt and [name] can explain his surgeries and MD saving his leg. He has no idea he had multiple clots, he thought his leg was hurting. He has spoken with the MD and feels he knows what the plan is going forward. 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, father, retiree, friend, etc Anticipated changes in roles/activities/participation: resume Pt/family expectations/goals: Pt states: " I hope to not be here long but want to heal also."  [name] states: " I hope he does well but knowing him he will, he is quite stubborn."  [employer]: None Premorbid Home Care/DME Agencies: Other (Comment) ([place]ane) Transportation available at discharge: Pt did drive prior to his surgery-Lisa drives Is the  patient able to respond to transportation needs?: Yes In the past 12 months, has lack of transportation kept you from medical appointments or from getting medications?: No In the past 12 months, has lack of transportation kept you from meetings, work, or from getting things needed for daily living?: No Resource referrals recommended: Neuropsychology  Discharge Planning Living Arrangements: Spouse/significant other Support Systems: Spouse/significant other, Children, Friends/neighbors Type of Residence: Private residence Insurance Resources: [employer] (specify county) ([employer]) Museum/gallery curator Resources: SSI, Family Support Financial Screen Referred: No Living Expenses: Own Money Management: Patient, Significant Other Does the patient have any problems obtaining your medications?: No Home Management: [name] Patient/Family Preliminary Plans: Return home with [name] providing his care except when she has to work-2x week. She is very supportive of pt and plans to be here daily. Pt is one who does what he wants. He is agreeable to stay here to get better but this may change. Plans on stayng for one week. Care Coordinator Barriers to Discharge: Wound Care, Lack of/limited family support, Insurance for SNF coverage, Medication compliance Care Coordinator Anticipated Follow Up Needs: HH/OP  Clinical Impression Pleasant gentleman who seems to do what he wants to do even though it may not be in his best interest. His girlfriend-Lisa is supportive and will assist him at discharge. May be times he is alone at home but feels he will manage. Will await therapy evaluations and work on discharge needs  Elease Hashimoto 12/04/2021, 9:40 AM

## 2021-12-04 NOTE — Progress Notes (Signed)
Patient is being noncompliant with medications even after nurse explains importance.  Very argumentative with nursing staff.  States he doesn't understand why he's in rehab and it's doing him no good.  Nurse explained to patient that this is the first 24 hours and to give it a chance.

## 2021-12-04 NOTE — Progress Notes (Signed)
Occupational Therapy Session Note  Patient Details  Name: [name] [name] MRN: [id number] Date of Birth: [birth year]  Today's Date: 12/04/2021 OT Individual Time: 1400-1500 OT Individual Time Calculation (min): 60 min    Short Term Goals: Week 1:  STG=LTG's due to LOS  Skilled Therapeutic Interventions/Progress Updates:  Pt in bed with significant other present upon OT arrival for session. Pt requested 2nd trial on toilet to have BM due to constipation for 10 days he reports. Pt required CGA with cough pillow support for rolling and supine to sit, then EOB sitting briefly. Pt transferred sit to stand with CGA and amb to and from bed to bathroom ~15 ft with RW. Transfer on and off toilet with CGA. Cues to ensure pt did not strain for BM but was indeed successful and had medium BM- RN notified and flowsheet tool updated. Pt performed lateral lean for hygiene and noted abdominal incisional drainage on bathroom floor. OT immediately called for RN and returned safely back to bed via am with RW. RN performed wound care to change dressings and OT followed up to assist with simple peri area sponge bathing and underwear and gown change bed level. Pt's significant other remained bedside and call bell, bed alarm and needs left in place after session.   Therapy Documentation Precautions:  Precautions Precautions: Fall Precaution Comments: wound vac d/c'd by MD earlier this am, Apply abdominal binder while out of bed. Restrictions Weight Bearing Restrictions: No Other Position/Activity Restrictions: a-line d/c'd earlier    Therapy/Group: Individual Therapy  Barnabas Lister 12/04/2021, 2:49 PM

## 2021-12-04 NOTE — Progress Notes (Signed)
[facility] [place] PROGRESS NOTE  ASSESSMENT / PLAN: [name] [name] is a 61 y.o. male status post: 1) aorto-bi-iliac bypass (20x38m [place]) 2) right ilio-femoral bypass (848mDacron) 3) right femoral - posterior tibial artery bypass (75m20m4) umbilical hernia repair  On 11/26/21  [facility]  OK to eat whatever he can tolerate from my standpoint. He had serous drainage from the midline incision yesterday. I redressed the incision today. Recommend twice daily dry dressing changes to midline - orders written.  Apply abdominal binder while out of bed. Will follow along.  SUBJECTIVE: Doing well. Serous drainage yesterday / last night. Wants a more liberal diet.  OBJECTIVE: BP (!) 113/94 (BP Location: Right Arm)   Pulse 97   Temp 98.4 F (36.9 C) (Oral)   Resp 14   Ht '6\' 3"'$  (1.905 m)   Wt 112.2 kg   SpO2 95%   BMI 30.92 kg/m   Intake/Output Summary (Last 24 hours) at 12/04/2021 1053 Last data filed at 12/04/2021 1003 Gross per 24 hour  Intake 280 ml  Output 1 ml  Net 279 ml    Constitutional: well appearing. no acute distress. Cardiac: RRR. Pulmonary: unlabored Abdomen: soft, not tender. Midline incision healing appropriately. Mild serous drainage. Vascular: groin incision healing appropriately. Calf incision healing appropriately.     Latest Ref Rng & Units 12/04/2021    5:33 AM 12/03/2021    2:11 AM 12/02/2021    3:00 AM  CBC  WBC 4.0 - 10.5 K/uL 7.4   8.7   6.8    Hemoglobin 13.0 - 17.0 g/dL 11.7   12.5   11.3    Hematocrit 39.0 - 52.0 % 35.3   37.1   34.4    Platelets 150 - 400 K/uL 301   300   271          Latest Ref Rng & Units 12/04/2021    5:33 AM 12/03/2021    2:11 AM 12/02/2021    3:00 AM  CMP  Glucose 70 - 99 mg/dL 102   100   86    BUN 8 - 23 mg/dL '13   13   10    '$ Creatinine 0.61 - 1.24 mg/dL 0.84   1.05   0.88    Sodium 135 - 145 mmol/L 139   139   137    Potassium 3.5 - 5.1 mmol/L 2.9   3.6    3.1    Chloride 98 - 111 mmol/L 103   100   101    CO2 22 - 32 mmol/L '27   28   24    '$ Calcium 8.9 - 10.3 mg/dL 8.6   8.6   8.4    Total Protein 6.5 - 8.1 g/dL 5.3      Total Bilirubin 0.3 - 1.2 mg/dL 1.2      Alkaline Phos 38 - 126 U/L 59      AST 15 - 41 U/L 15      ALT 0 - 44 U/L 31        Estimated Creatinine Clearance: 124.9 mL/min (by C-G formula based on SCr of 0.84 mg/dL).  ThoYevonne AlineawStanford BreedD [facility] Number: ([phone]/2023 10:53 AM

## 2021-12-04 NOTE — Plan of Care (Signed)
  Problem: RH Balance Goal: LTG Patient will maintain dynamic standing with ADLs (OT) Description: LTG:  Patient will maintain dynamic standing balance with assist during activities of daily living (OT)  Flowsheets (Taken 12/04/2021 1618) LTG: Pt will maintain dynamic standing balance during ADLs with: Independent with assistive device   Problem: Sit to Stand Goal: LTG:  Patient will perform sit to stand in prep for activites of daily living with assistance level (OT) Description: LTG:  Patient will perform sit to stand in prep for activites of daily living with assistance level (OT) Flowsheets (Taken 12/04/2021 1618) LTG: PT will perform sit to stand in prep for activites of daily living with assistance level: Independent with assistive device   Problem: RH Grooming Goal: LTG Patient will perform grooming w/assist,cues/equip (OT) Description: LTG: Patient will perform grooming with assist, with/without cues using equipment (OT) Flowsheets (Taken 12/04/2021 1618) LTG: Pt will perform grooming with assistance level of: Independent   Problem: RH Bathing Goal: LTG Patient will bathe all body parts with assist levels (OT) Description: LTG: Patient will bathe all body parts with assist levels (OT) Flowsheets (Taken 12/04/2021 1618) LTG: Pt will perform bathing with assistance level/cueing: Independent with assistive device  LTG: Position pt will perform bathing: At sink   Problem: RH Dressing Goal: LTG Patient will perform lower body dressing w/assist (OT) Description: LTG: Patient will perform lower body dressing with assist, with/without cues in positioning using equipment (OT) Flowsheets (Taken 12/04/2021 1618) LTG: Pt will perform lower body dressing with assistance level of: Independent with assistive device   Problem: RH Toileting Goal: LTG Patient will perform toileting task (3/3 steps) with assistance level (OT) Description: LTG: Patient will perform toileting task (3/3 steps) with  assistance level (OT)  Flowsheets (Taken 12/04/2021 1618) LTG: Pt will perform toileting task (3/3 steps) with assistance level: Independent with assistive device   Problem: RH Simple Meal Prep Goal: LTG Patient will perform simple meal prep w/assist (OT) Description: LTG: Patient will perform simple meal prep with assistance, with/without cues (OT). Flowsheets (Taken 12/04/2021 1618) LTG: Pt will perform simple meal prep with assistance level of: Independent with assistive device LTG: Pt will perform simple meal prep w/level of: Ambulate with device   Problem: RH Toilet Transfers Goal: LTG Patient will perform toilet transfers w/assist (OT) Description: LTG: Patient will perform toilet transfers with assist, with/without cues using equipment (OT) Flowsheets (Taken 12/04/2021 1618) LTG: Pt will perform toilet transfers with assistance level of: Independent with assistive device   Problem: RH Tub/Shower Transfers Goal: LTG Patient will perform tub/shower transfers w/assist (OT) Description: LTG: Patient will perform tub/shower transfers with assist, with/without cues using equipment (OT) Flowsheets (Taken 12/04/2021 1618) LTG: Pt will perform tub/shower stall transfers with assistance level of: Independent with assistive device

## 2021-12-04 NOTE — Progress Notes (Signed)
Inpatient [facility] Individual Statement of Services  Patient Name:  [name] [name]  Date:  12/04/2021  Welcome to the [facility].  Our goal is to provide you with an individualized program based on your diagnosis and situation, designed to meet your specific needs.  With this comprehensive rehabilitation program, you will be expected to participate in at least 3 hours of rehabilitation therapies Monday-Friday, with modified therapy programming on the weekends.  Your rehabilitation program will include the following services:  Physical Therapy (PT), Occupational Therapy (OT), 24 hour per day rehabilitation nursing, Therapeutic Recreaction (TR), Neuropsychology, Care Coordinator, Rehabilitation Medicine, Nutrition Services, and Pharmacy Services  Weekly team conferences will be held on Tuesday to discuss your progress.  Your Inpatient Rehabilitation Care Coordinator will talk with you frequently to get your input and to update you on team discussions.  Team conferences with you and your family in attendance may also be held.  Expected length of stay: 7-9 Days  Overall anticipated outcome: supervision-mod/I level  Depending on your progress and recovery, your program may change. Your Inpatient Rehabilitation Care Coordinator will coordinate services and will keep you informed of any changes. Your Inpatient Rehabilitation Care Coordinator's name and contact numbers are listed  below.  The following services may also be recommended but are not provided by the [facility] will be made to provide these services after discharge if needed.  Arrangements include referral to agencies that provide these services.  Your insurance has been verified to be:  [employer] Your primary doctor is:  Darrick Meigs Rylee  Pertinent information  will be shared with your doctor and your insurance company.  Inpatient Rehabilitation Care Coordinator:  Ovidio Kin, [place] or Emilia Beck  Information discussed with and copy given to patient by: Elease Hashimoto, 12/04/2021, 9:43 AM

## 2021-12-04 NOTE — Evaluation (Signed)
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   Right 1st seen 11/14; thrombosed 11/15   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   Right SFA 05/07/17 while on warfarin INR 2.9   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   Right SFA 05/07/17 while on warfarin INR 2.9   Atrial fibrillation ([place])    Benign essential HTN 01/04/2012   Chronic anticoagulation 01/02/2013   Dermatofibroma of forearm 01/02/2013   Left side   DVT, lower extremity, proximal ([place]) 05/24/2013   Right leg femoral & popliteal 05/24/13   Hyperlipidemia, mixed 01/04/2012   Hypothyroidism    Pneumonia    Polycythemia secondary to smoking 01/04/2012   Primary hypercoagulable state ([place]) 10/01/2014   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[facility])    Sinus bradycardia, chronic 01/04/2012   Superficial thrombosis of lower extremity 05/02/2012   Past Surgical History:  Past Surgical History:  Procedure Laterality Date   ABDOMINAL AORTOGRAM W/LOWER EXTREMITY Right 11/10/2021   Procedure: ABDOMINAL AORTOGRAM W/LOWER EXTREMITY;  Surgeon: Waynetta Sandy, MD;  Location: [facility];  Service: Cardiovascular;  Laterality: Right;   AORTA - BILATERAL FEMORAL ARTERY BYPASS GRAFT Bilateral 11/26/2021   Procedure: AORTO- BIILIAC ARTERY  BYPASS GRAFT USING HEMASHIELD 20 X 11MM, RIGHT  ILIO-FEMORAL BYPASS  UMBILICAL HERNIA REPAIR;  Surgeon: Cherre Robins, MD;  Location: [facility];  Service: Vascular;  Laterality: Bilateral;   APPLICATION OF WOUND VAC Right 11/26/2021   Procedure: APPLICATION OF WOUND VAC;  Surgeon: Cherre Robins, MD;  Location: [facility];  Service: Vascular;  Laterality: Right;   DIRECT LARYNGOSCOPY Left 10/19/2018   Procedure: DIRECT LARYNGOSCOPY;  Surgeon: Leta Baptist, MD;  Location: [facility];  Service: ENT;  Laterality: Left;   FEMORAL-TIBIAL BYPASS GRAFT Right 11/26/2021   Procedure: RIGHT FEMORALTIBIAL  ARTERY BYPASS GRAFT USING PROPATEN GRAFT;  Surgeon: Cherre Robins, MD;  [facility];  Service: Vascular;  Laterality: Right;   IR IMAGING GUIDED PORT INSERTION  11/04/2018   IR REMOVAL TUN ACCESS W/ PORT W/O FL MOD SED  05/01/2019   TONSILLECTOMY Left 10/19/2018   Procedure: BIOPSY OF LEFT TONSIL;  Surgeon: Leta Baptist, MD;  Location: [facility];  Service: ENT;  Laterality: Left;    Assessment & Plan Clinical Impression: Patient is a 61 year old male with 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 [name] and Dr. Carlis Abbott .  Post op with hypotension requiring Neo as well as abdominal pain w/nausea and ABLA  requiring 2 units PRBC and 2 units FFP.  He was maintained on IV heparin and transitioned to Xarelto on 05/16. Fluid overload treated with IV diuresis. ABLA is stable. He is tolerating liquid diet but continues to report abdominal pain, constipation and productive cough. PT/OT has been working with  patient and he continues to be limited by incisional pain, weakness, poor safety awareness as well as tachycardia limiting ambulation-->HR up to 169 with short distances. CIR recommended due to functional decline.  He has used dilaudid and oxycodone for his incisional pain.   He reports chronic limited jaw movement due to treatment prior neoplasm of tonsillar fossa. Patient transferred to CIR on 12/03/2021 .   Patient currently requires  CGA  with mobility secondary to muscle weakness, decreased cardiorespiratoy endurance, and decreased standing balance and decreased balance strategies.  Prior to hospitalization, patient was independent  with mobility and lived with Significant other, Family (x2 daughters) in a House home.  Home access is 4Stairs to enter.  Patient will benefit from skilled PT intervention to maximize safe functional mobility, minimize fall risk, and decrease caregiver burden for planned discharge home with 24 hour supervision.  Anticipate patient will benefit from follow up [place] at discharge.  PT - End of Session Activity Tolerance: Tolerates < 10 min activity, no significant change in vital signs Endurance Deficit: Yes Endurance Deficit Description: seated rest breaks b/w simple functional mobility tasks PT Assessment Rehab Potential (ACUTE/IP ONLY): Good PT Barriers to Discharge: Home environment access/layout;Decreased caregiver support;Insurance for SNF coverage PT Patient demonstrates impairments in the following area(s): Balance;Edema;Endurance;Motor;Safety;Sensory;Skin Integrity PT Transfers Functional Problem(s): Bed Mobility;Car;Bed to Chair PT Locomotion Functional Problem(s): Ambulation;Stairs PT Plan PT Intensity: Minimum of 1-2 x/day ,45 to 90 minutes PT Frequency: 5 out of 7 days PT Duration Estimated Length of Stay: 7-9 days PT Treatment/Interventions: Ambulation/gait training;Discharge planning;Functional mobility training;Psychosocial support;Therapeutic  Activities;Visual/perceptual remediation/compensation;Wheelchair propulsion/positioning;Therapeutic Exercise;Skin care/wound management;Neuromuscular re-education;Disease management/prevention;Balance/vestibular training;Cognitive remediation/compensation;DME/adaptive equipment instruction;Pain management;Splinting/orthotics;UE/LE Strength taining/ROM;UE/LE Coordination activities;Stair training;Patient/family education;Functional electrical stimulation;Community reintegration PT Transfers Anticipated Outcome(s): Mod I with LRAD PT Locomotion Anticipated Outcome(s): supervision with LRAD PT Recommendation Follow Up Recommendations: 24 hour supervision/assistance Patient destination: Home Equipment Recommended: To be determined   PT Evaluation Precautions/Restrictions Precautions Precautions: Fall Precaution Comments: wound vac to R groin Restrictions Weight Bearing Restrictions: No General   Vital SignsTherapy Vitals Temp: 98.4 F (36.9 C) Temp Source: Oral Pulse Rate: 97 Resp: 14 BP: (!) 113/94 Patient Position (if appropriate): Lying Oxygen Therapy SpO2: 95 % O2 Device: Room Air Pain Pain Assessment Pain Scale: 0-10 Pain Score: 2  Pain Location: Leg Pain Orientation: Right Pain Descriptors / Indicators: Aching Pain Intervention(s): Ambulation/increased activity Pain Interference Pain Interference Pain Effect on Sleep: 3. Frequently Pain Interference with Therapy Activities: 3. Frequently Pain Interference with Day-to-Day Activities: 3. Frequently Home Living/Prior Functioning Home Living Available Help at Discharge: Family;Available 24 hours/day (GF works weekends) Type of Home: House Home Access: Stairs to enter [employer] of Steps: 4 Entrance Stairs-Rails: Can reach both Home Layout: Two level;Able to live on main level with bedroom/bathroom Alternate Level Stairs-Number of Steps: full flight.  Patient can stay downstairs, but shower/tub is up stairs.   Half bath on main level. Alternate Level Stairs-Rails: Right Bathroom Shower/Tub: Chiropodist: Standard Bathroom Accessibility: Yes  Lives With: Significant other;Family (x2 daughters) Prior Function Level of Independence: Independent with gait;Independent with basic ADLs;Independent with transfers;Independent with homemaking with ambulation  Able to Take Stairs?: Yes Driving: Yes Vocation: On disability Vision/Perception  Vision -  History Ability to See in Adequate Light: 0 Adequate Perception Perception: Within Functional Limits Praxis Praxis: Intact  Cognition Overall Cognitive Status: Within Functional Limits for tasks assessed Arousal/Alertness: Awake/alert Orientation Level: Oriented X4 Sensation Sensation Light Touch: Impaired by gross assessment Hot/Cold: Appears Intact Proprioception: Appears Intact Stereognosis: Appears Intact Additional Comments: premorbid RLE sensation deficits, distal to proximal Coordination Gross Motor Movements are Fluid and Coordinated: No Coordination and Movement Description: limited by abdominal discomfort and RLE pain with mobility Motor  Motor Motor: Other (comment) Motor - Skilled Clinical Observations: Generalized weakness and deconditioning   Trunk/Postural Assessment  Cervical Assessment Cervical Assessment: Exceptions to [facility] (forward head) Thoracic Assessment Thoracic Assessment: Within Functional Limits Lumbar Assessment Lumbar Assessment: Exceptions to [facility] (posterior pelvic tilt) Postural Control Postural Control: Within Functional Limits  Balance Balance Balance Assessed: Yes Static Sitting Balance Static Sitting - Balance Support: No upper extremity supported;Feet supported Static Sitting - Level of Assistance: 5: Stand by assistance Dynamic Sitting Balance Dynamic Sitting - Balance Support: Feet supported;During functional activity Dynamic Sitting - Level of Assistance: 5: Stand by  assistance Static Standing Balance Static Standing - Balance Support: No upper extremity supported Static Standing - Level of Assistance: Other (comment) (CGA) Dynamic Standing Balance Dynamic Standing - Balance Support: No upper extremity supported;During functional activity Dynamic Standing - Level of Assistance: 4: Min assist Extremity Assessment      RLE Assessment RLE Assessment: Exceptions to [facility] General Strength Comments: Grossly 4/5 LLE Assessment LLE Assessment: Exceptions to [facility] General Strength Comments: Grossly 4+/5  Care Tool Care Tool Bed Mobility Roll left and right activity   Roll left and right assist level: Contact Guard/Touching assist    Sit to lying activity   Sit to lying assist level: Contact Guard/Touching assist    Lying to sitting on side of bed activity   Lying to sitting on side of bed assist level: the ability to move from lying on the back to sitting on the side of the bed with no back support.: Contact Guard/Touching assist     Care Tool Transfers Sit to stand transfer   Sit to stand assist level: Contact Guard/Touching assist    Chair/bed transfer   Chair/bed transfer assist level: Contact Guard/Touching assist     Psychologist, counselling transfer activity did not occur: Safety/medical concerns (fatigue)        Care Tool Locomotion Ambulation   Assist level: Contact Guard/Touching assist Assistive device: Walker-rolling Max distance: 56f  Walk 10 feet activity   Assist level: Contact Guard/Touching assist Assistive device: Walker-rolling   Walk 50 feet with 2 turns activity   Assist level: Contact Guard/Touching assist Assistive device: Walker-rolling  Walk 150 feet activity Walk 150 feet activity did not occur: Safety/medical concerns (fatigue)      Walk 10 feet on uneven surfaces activity Walk 10 feet on uneven surfaces activity did not occur: Safety/medical concerns      Stairs   Assist level: Contact  Guard/Touching assist Stairs assistive device: 2 hand rails Max number of stairs: 4  Walk up/down 1 step activity   Walk up/down 1 step (curb) assist level: Contact Guard/Touching assist Walk up/down 1 step or curb assistive device: 2 hand rails  Walk up/down 4 steps activity   Walk up/down 4 steps assist level: Contact Guard/Touching assist Walk up/down 4 steps assistive device: 2 hand rails  Walk up/down 12 steps activity Walk up/down 12 steps activity did not occur: Safety/medical concerns (  fatigue)      Pick up small objects from floor Pick up small object from the floor (from standing position) activity did not occur: Safety/medical concerns      Wheelchair Is the patient using a wheelchair?: Yes Type of Wheelchair: Manual   Wheelchair assist level: Supervision/Verbal cueing Max wheelchair distance: 35f  Wheel 50 feet with 2 turns activity   Assist Level: Supervision/Verbal cueing  Wheel 150 feet activity Wheelchair 150 feet activity did not occur: Safety/medical concerns (fatigue)      Refer to [place] Plan for [place] PT Short Term Goal 1 (Week 1): STG = LTG  Recommendations for other services: None   Skilled Therapeutic Intervention Mobility Bed Mobility Bed Mobility: Sit to Supine;Supine to Sit Rolling Right: Contact Guard/Touching assist Supine to Sit: Contact Guard/Touching assist Sit to Supine: Contact Guard/Touching assist Transfers Transfers: Sit to Stand;Stand to Sit;Stand Pivot Transfers Sit to Stand: Contact Guard/Touching assist Stand to Sit: Contact Guard/Touching assist Stand Pivot Transfers: Contact Guard/Touching assist Transfer (Assistive device): Rolling walker Locomotion  Gait Ambulation: Yes Gait Assistance: Contact Guard/Touching assist Gait Distance (Feet): 50 Feet Assistive device: Rolling walker Gait Gait: Yes Gait Pattern: Impaired Gait Pattern: Step-through pattern;Trunk flexed;Decreased stride  length Gait velocity: decreased Stairs / Additional Locomotion Stairs: Yes Stairs Assistance: Contact Guard/Touching assist Stair Management Technique: Two rails Number of Stairs: 4 Height of Stairs: 6 Wheelchair Mobility Wheelchair Mobility: Yes Wheelchair Assistance: SChartered loss adjuster Both upper extremities Wheelchair Parts Management: Needs assistance Distance: 54f Skilled Intervention: Pt supine in bed to start PT evaluation - girlfriend at the bedside.   Retrieved 20x18 w/c from DME closet (*unable to locate cushion, will need one).   Instructed pt in results of PT evaluation as detailed above, PT POC, rehab potential, rehab goals, and discharge recommendations. Additionally discussed CIR's policies regarding fall safety and use of chair alarm and/or quick release belt. Pt verbalized understanding and in agreement.   Pt requiring gentle encouragement to mobilize - fearful of movement due to incisions and concern for "leaking." RN entering room who reassured pt.  Pt presenting with generalized weakness and deconditioning, decreased safety awareness, and decreased activity tolerance. Bed mobility completed with CGA with hospital bed features (pain with lying flat). Sitting balance with SBA, sit<>stand transfers with CGA and RW, and short distance ambulation with CGA and RW. Distances limited by fatigue. Stair training up/down x4, 6inch steps with CGA and 2 hand rails, self selected reciprocal sequencing without much difficulty.   Concluded session back in bed with bed alarm on, GF at bedside, all needs met.   Discharge Criteria: Patient will be discharged from PT if patient refuses treatment 3 consecutive times without medical reason, if treatment goals not met, if there is a change in medical status, if patient makes no progress towards goals or if patient is discharged from hospital.  The above assessment, treatment plan, treatment alternatives and  goals were discussed and mutually agreed upon: by patient and by family  ChAlger SimonsT, DPT 12/04/2021, 9:12 AM

## 2021-12-04 NOTE — Progress Notes (Signed)
Inpatient Rehabilitation  Patient information reviewed and entered into eRehab system by Greene Diodato M. Fonnie Crookshanks, M.A., CCC/SLP, PPS Coordinator.  Information including medical coding, functional ability and quality indicators will be reviewed and updated through discharge.    

## 2021-12-04 NOTE — Discharge Summary (Signed)
[facility] Discharge Summary   Patient ID:  [name] [name] MRN: [id number] DOB/AGE: 62/02/1961 62 y.o.  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[place]) 05/25/2017   Right SFA 05/07/17 while on warfarin INR 2.9   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   Right SFA 05/07/17 while on warfarin INR 2.9   Atrial fibrillation (HCC)    Benign essential HTN 01/04/2012   Chronic anticoagulation 01/02/2013   Dermatofibroma of forearm 01/02/2013   Left side   DVT, lower extremity, proximal ([place]) 05/24/2013   Right leg femoral & popliteal 05/24/13   Hyperlipidemia, mixed 01/04/2012   Hypothyroidism    Pneumonia    Polycythemia secondary to smoking 01/04/2012   Primary hypercoagulable state ([place]) 10/01/2014   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HCC)    Sinus bradycardia, chronic 01/04/2012   Superficial thrombosis of lower extremity 05/02/2012    Procedure(s): AORTO- BIILIAC ARTERY  BYPASS GRAFT USING HEMASHIELD 20 X 11MM, RIGHT ILIO-FEMORAL BYPASS  UMBILICAL HERNIA REPAIR RIGHT FEMORALTIBIAL  ARTERY BYPASS GRAFT USING PROPATEN GRAFT APPLICATION OF CELL SAVER APPLICATION OF WOUND VAC  Discharged Condition: stable  HPI: 61 y/o male with ectatic infrarenal abdominal aorta. He has a large right common iliac artery aneurysm. He has an occluded left internal iliac artery. He has an occluded right external iliac artery. 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 He has reconsititution of a right profunda femoris artery. His right SFA and popliteal artery are occluded. His PT opacifies in the calf, but this study is not ideal to to study infrageniculate flow.  He was scheduled for open intervention to restore inflow to his Lower extremities.   Hospital Course:  [name] is a 61 y.o. male is S/P  Procedure(s): AORTO- BIILIAC ARTERY  BYPASS GRAFT USING HEMASHIELD 20 X 11MM, RIGHT ILIO-FEMORAL BYPASS  UMBILICAL HERNIA REPAIR RIGHT FEMORALTIBIAL  ARTERY BYPASS GRAFT USING PROPATEN GRAFT APPLICATION OF CELL SAVER APPLICATION OF WOUND VAC Post operative issues included vomiting and operative blood loss anemia requiring transfusion.  His renal function was maintained htroughout his admission.  Fluid volumes >9000 requiring lasix with improved vol and maintained urine OP.   Pain control issues requiring PCA. Inflow improved to the left LE with doppler signals PT/peroneal and palpable DP on the right LE. Post op day 3 he was transfused to 4E in stable condition.  Clear diet tolerated and home medication were restarted.  He was discharged to [facility] 12/02/21 for continued rehab for mobility.  His oral anticoagulation was restarted cont. ASA, Statin, and Xarelto daily.    Significant Diagnostic Studies: CBC Lab Results  Component Value Date   WBC 7.4 12/04/2021   HGB 11.7 (L) 12/04/2021   HCT 35.3 (L) 12/04/2021   MCV 91.7 12/04/2021   PLT 301 12/04/2021    BMET    Component Value Date/Time   NA 139 12/04/2021 0533   NA 142 10/03/2018 1450   NA 141 05/21/2014 1226   K 2.9 (L) 12/04/2021 0533   K 4.3 05/21/2014  1226   CL 103 12/04/2021 0533   CL 108 (H) 12/28/2012 1405   CO2 27 12/04/2021 0533   CO2 28 05/21/2014 1226   GLUCOSE 102 (H) 12/04/2021 0533   GLUCOSE 101 05/21/2014 1226   GLUCOSE 95 12/28/2012 1405   BUN 13 12/04/2021 0533   BUN 15 10/03/2018 1450   BUN 13.2  05/21/2014 1226   CREATININE 0.84 12/04/2021 0533   CREATININE 0.93 08/27/2021 1459   CREATININE 0.89 08/21/2019 1523   CREATININE 0.9 05/21/2014 1226   CALCIUM 8.6 (L) 12/04/2021 0533   CALCIUM 9.3 05/21/2014 1226   GFRNONAA >60 12/04/2021 0533   GFRNONAA >60 08/27/2021 1459   GFRNONAA >60 10/15/2011 0224   GFRAA >60 04/15/2020 1447   GFRAA >60 10/15/2011 0224   COAG Lab Results  Component Value Date   INR 1.2 11/26/2021   INR 1.1 11/26/2021   INR 1.2 11/07/2021   PROTIME 28.8 (H) 02/04/2015   PROTIME 28.8 (H) 12/10/2014   PROTIME 36.0 (H) 08/24/2014     Disposition:  Discharge to :Rehab Discharge Instructions     Call MD for:  redness, tenderness, or signs of infection (pain, swelling, bleeding, redness, odor or green/yellow discharge around incision site)   Complete by: As directed    Call MD for:  severe or increased pain, loss or decreased feeling  in affected limb(s)   Complete by: As directed    Call MD for:  temperature >100.5   Complete by: As directed    Resume previous diet   Complete by: As directed       Allergies as of 12/03/2021   No Known Allergies      Medication List     TAKE these medications    aspirin EC 81 MG tablet Take 1 tablet (81 mg total) by mouth daily. Swallow whole.   atorvastatin 20 MG tablet Commonly known as: Lipitor Take 1 tablet (20 mg total) by mouth daily.   atorvastatin 40 MG tablet Commonly known as: LIPITOR Take 1 tablet (40 mg total) by mouth daily.   cyclobenzaprine 10 MG tablet Commonly known as: FLEXERIL TAKE 1 TABLET BY MOUTH THREE TIMES A DAY AS NEEDED FOR MUSCLE SPASMS   enoxaparin 120 MG/0.8ML injection Commonly known as: Lovenox Inject 0.72 mLs (108 mg total) into the skin every 12 (twelve) hours for 9 days.   glycopyrrolate 2 MG tablet Commonly known as: ROBINUL Take 1 tablet (2 mg total) by mouth 3 (three) times daily as needed (mouth secretions).   levothyroxine 25 MCG tablet Commonly known  as: SYNTHROID Take 1 tablet (25 mcg total) by mouth daily before breakfast. Take on empty stomach w/ water, 1 hour before other meds or food.   metoprolol succinate 25 MG 24 hr tablet Commonly known as: TOPROL-XL TAKE 1 TABLET (25 MG TOTAL) BY MOUTH DAILY. What changed: when to take this   ondansetron 4 MG disintegrating tablet Commonly known as: ZOFRAN-ODT Take 1 tablet (4 mg total) by mouth every 8 (eight) hours as needed for nausea or vomiting.   oxyCODONE 5 MG immediate release tablet Commonly known as: Oxy IR/ROXICODONE Take 1 tablet (5 mg total) by mouth every 6 (six) hours as needed for moderate pain.   rivaroxaban 20 MG Tabs tablet Commonly known as: XARELTO Take 1 tablet (20 mg total) by mouth daily with supper.   senna-docusate 8.6-50 MG tablet Commonly known as: Senokot-S Take 1 tablet by mouth daily. What changed:  when to take this reasons to take this  sodium fluoride 1.1 % Crea dental cream Commonly known as: PreviDent 5000 Plus Apply to tooth brush. Brush teeth for 2 minutes. Spit out excess. DO NOT rinse afterwards. Repeat nightly.   traZODone 50 MG tablet Commonly known as: DESYREL Take 1 tablet (50 mg total) by mouth at bedtime. What changed:  when to take this reasons to take this   vitamin E 180 MG (400 UNITS) capsule Take 400 IU daily x 1 week, then 400 IU BID       Verbal and written Discharge instructions given to the patient. Wound care per Discharge AVS   Signed: Roxy Horseman 12/04/2021, 10:11 AM - For VQI Registry use --- Instructions: Press F2 to tab through selections.  Delete question if not applicable.   Post-op:  Time to Extubation: '[ ]'$  In OR, '[ ]'$  < 12 hrs, '[ ]'$  12-24 hrs, '[ ]'$  >=24 hrs Vasopressors Req. Post-op: No ICU Stay: 3 days Transfusion: Yes  If yes, 4 units given MI: [ x] No, '[ ]'$  Troponin only, '[ ]'$  EKG or Clinical New Arrhythmia: No  Complications: CHF: No Resp failure: [ x] none, '[ ]'$  Pneumonia, '[ ]'$   Ventilator Chg in renal function: [ x] none, '[ ]'$  Inc. Cr > 0.5, '[ ]'$  Temp. Dialysis, '[ ]'$  Permanent dialysis Leg ischemia: [ x] No, '[ ]'$  Yes, no Surgery needed, '[ ]'$  Yes, Surgery needed, '[ ]'$  Amputation Bowel ischemia: [x ] No, '[ ]'$  Medical Rx, '[ ]'$  Surgical Rx Wound complication: [ x] No, '[ ]'$  Superficial separation/infection, '[ ]'$  Return to OR Return to OR: No  Return to OR for bleeding: No Stroke: '[ ]'$  None, '[ ]'$  Minor, '[ ]'$  Major  Discharge medications: Statin use:  Yes ASA use:  Yes Plavix use:  No  for medical reason not indicated Beta blocker use: Yes Xarelto

## 2021-12-04 NOTE — Evaluation (Addendum)
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   Right 1st seen 11/14; thrombosed 11/15   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   Right SFA 05/07/17 while on warfarin INR 2.9   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[place]) 05/25/2017   Right SFA 05/07/17 while on warfarin INR 2.9   Atrial fibrillation ([place])    Benign essential HTN 01/04/2012   Chronic anticoagulation 01/02/2013   Dermatofibroma of forearm 01/02/2013   Left side   DVT, lower extremity, proximal ([place]) 05/24/2013   Right leg femoral & popliteal 05/24/13   Hyperlipidemia, mixed 01/04/2012   Hypothyroidism    Pneumonia    Polycythemia secondary to smoking 01/04/2012   Primary hypercoagulable state ([address]) 10/01/2014   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HCC)    Sinus bradycardia, chronic 01/04/2012   Superficial thrombosis of lower extremity 05/02/2012   Past Surgical History:  Past Surgical History:  Procedure Laterality Date   ABDOMINAL AORTOGRAM W/LOWER EXTREMITY Right 11/10/2021   Procedure: ABDOMINAL AORTOGRAM W/LOWER EXTREMITY;  Surgeon: Waynetta Sandy, MD;  Location: [facility];  Service: Cardiovascular;  Laterality: Right;   AORTA - BILATERAL FEMORAL ARTERY BYPASS GRAFT Bilateral 11/26/2021   Procedure: AORTO- BIILIAC ARTERY  BYPASS GRAFT USING HEMASHIELD 20 X 11MM, RIGHT ILIO-FEMORAL  BYPASS  UMBILICAL HERNIA REPAIR;  Surgeon: Cherre Robins, MD;  Location: [place];  Service: Vascular;  Laterality: Bilateral;   APPLICATION OF WOUND VAC Right 11/26/2021   Procedure: APPLICATION OF WOUND VAC;  Surgeon: Cherre Robins, MD;  Location: [place];  Service: Vascular;  Laterality: Right;   DIRECT LARYNGOSCOPY Left 10/19/2018   Procedure: DIRECT LARYNGOSCOPY;  Surgeon: Leta Baptist, MD;  Location: [place];  Service: ENT;  Laterality: Left;   FEMORAL-TIBIAL BYPASS GRAFT Right 11/26/2021   Procedure: RIGHT FEMORALTIBIAL  ARTERY BYPASS GRAFT USING PROPATEN GRAFT;  Surgeon: Cherre Robins, MD;  Location: [facility];  Service: Vascular;  Laterality: Right;   IR IMAGING GUIDED PORT INSERTION  11/04/2018   IR REMOVAL TUN ACCESS W/ PORT W/O FL MOD SED  05/01/2019   TONSILLECTOMY Left 10/19/2018   Procedure: BIOPSY OF LEFT TONSIL;  Surgeon: Leta Baptist, MD;  Location: [place];  Service: ENT;  Laterality: Left;    Assessment & Plan Clinical Impression: Patient is a 61 year old male with 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 [name] and Dr. Carlis Abbott .  Post op with hypotension requiring Neo as well as abdominal pain  w/nausea and ABLA requiring 2 units PRBC and 2 units FFP.  He was maintained on IV heparin and transitioned to Xarelto on 05/16. Fluid overload treated with IV diuresis. ABLA is stable. He is tolerating liquid diet but continues to report abdominal pain, constipation and productive cough. PT/OT has been working with patient and he  continues to be limited by incisional pain, weakness, poor safety awareness as well as tachycardia limiting ambulation-->HR up to 169 with short distances. CIR recommended due to functional decline.  He has used dilaudid and oxycodone for his incisional pain.   He reports chronic limited jaw movement due to treatment prior neoplasm of tonsillar fossa. Patient transferred to CIR on 12/03/2021 .    Patient currently requires min with basic self-care skills secondary to decreased activity tolerance, decreased strength, decreased standing balance and decreased balance strategies.  Prior to hospitalization, patient could complete all ascpects of ADL's and light IADL's including driving independent .   Patient will benefit from skilled intervention to decrease level of assist with basic self-care skills, increase independence with basic self-care skills, and increase level of independence with iADL prior to discharge home with care partner.  Anticipate patient will require 24 hour supervision and follow up home health.      OT Evaluation Precautions/Restrictions: Universal, abdominal incisions, liquid diet with ST assessing  Precautions Precautions: Fall Precaution Comments: abdominal binder OOB Restrictions Weight Bearing Restrictions: No General   Vital Signs   Pain Pain Assessment Pain Scale: 0-10 Pain Score: 0-No pain Home Living/Prior Functioning Home Living Family/patient expects to be discharged to:: Private residence Living Arrangements: Spouse/significant other Available Help at Discharge: Family, Available 24 hours/day Type of Home: House Home Access: Stairs to enter [employer] of Steps: 4 Entrance Stairs-Rails: Can reach both Home Layout: Two level, Able to live on main level with bedroom/bathroom Alternate Level Stairs-Number of Steps: full flight.  Patient can stay downstairs, but shower/tub is up stairs.  Half bath on main level. 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 and grocery shop Prior Function Level of Independence: Independent with gait, Independent with basic ADLs, Independent with transfers, Independent with homemaking with ambulation  Able to Take Stairs?: Yes Driving: Yes Vocation: On disability Leisure: Hobbies-yes (Comment) Vision Baseline Vision/History: 1 Wears glasses Ability to See in Adequate Light: 0 Adequate Patient Visual Report: No change from baseline Vision Assessment?: No apparent visual deficits Perception  Perception: Within Functional Limits Praxis Praxis: Intact Cognition Cognition Overall Cognitive Status: Within Functional Limits for tasks assessed Arousal/Alertness: Awake/alert Memory: Appears intact Awareness: Appears intact Problem Solving: Appears intact Safety/Judgment: Impaired (pt with poor insight and states he will not use reccommended AD at home) Sensation Sensation Light Touch: Appears Intact Hot/Cold: Appears Intact Proprioception: Appears Intact Stereognosis: Appears Intact Additional Comments: premorbid RLE sensation deficits, distal to proximal Coordination Gross Motor Movements are Fluid and Coordinated: No Fine Motor Movements are Fluid and Coordinated: Yes Coordination and Movement Description: limited by abdominal discomfort and RLE pain with mobility Motor  Motor Motor: Other (comment) Motor - Skilled Clinical Observations: Generalized weakness and deconditioning  Trunk/Postural Assessment   Cervical Assessment Cervical  Assessment: Exceptions to [facility] Cervical AROM Overall Cervical AROM Comments: due to hx of tonsillar mass excision Thoracic Assessment Thoracic Assessment: Within Functional Limits Lumbar Assessment Lumbar Assessment: Exceptions to [facility] Lumbar AROM Overall Lumbar AROM Comments: posterior pelvic sitting and stiffness Postural Control Postural Control: Within Functional Limits  Balance Balance Balance Assessed: Yes Static Sitting Balance Static Sitting - Balance Support: No upper extremity supported;Feet supported Static Sitting - Level of Assistance: 6: Modified independent (Device/Increase time) Dynamic Sitting Balance Dynamic Sitting - Balance Support: Feet supported;During functional activity Dynamic Sitting - Level of Assistance: 6: Modified independent (Device/Increase time) Static Standing Balance Static Standing - Balance Support: Left upper extremity supported Static Standing - Level of Assistance: 6: Modified independent (Device/Increase time) Dynamic Standing Balance Dynamic Standing - Balance Support: No upper extremity supported;Left upper extremity supported Dynamic Standing - Level of Assistance: 5: Stand by assistance Extremity/Trunk Assessment      Care Tool Care Tool Self Care Eating   Eating Assist Level: Set up assist    Oral Care    Oral Care Assist Level: Independent with assistive device Assistive Device Comment: suction tooth brush  Bathing   Body parts bathed by patient: Right arm;Left arm;Chest;Abdomen;Front perineal area;Buttocks;Right upper leg;Left upper leg;Face;Right lower leg;Left lower leg     Assist Level: Set up assist    Upper Body Dressing(including orthotics)   What is the patient wearing?: Pull over shirt   Assist Level: Independent    Lower Body Dressing (excluding footwear)   What is the patient wearing?: Underwear/pull up;Pants Assist for lower body dressing: Independent    Putting on/Taking off  footwear   What is the patient wearing?: Non-skid slipper socks;Shoes Assist for footwear: Independent       Care Tool Toileting Toileting activity     Assistive Device Comment: Indep urinal use, S for toilet   Care Tool Bed Mobility Roll left and right activity   Roll left and right assist level: Independent    Sit to lying activity   Sit to lying assist level: Independent    Lying to sitting on side of bed activity   Lying to sitting on side of bed assist level: the ability to move from lying on the back to sitting on the side of the bed with no back support.: Independent     Care Tool Transfers Sit to stand transfer   Sit to stand assist level: Supervision/Verbal cueing    Chair/bed transfer   Chair/bed transfer assist level: Supervision/Verbal cueing     Toilet transfer   Assist Level: Supervision/Verbal cueing     Care Tool Cognition  Expression of Ideas and Wants Expression of Ideas and Wants: 4. Without difficulty (complex and basic) - expresses complex messages without difficulty and with speech that is clear and easy to understand  Understanding Verbal and Non-Verbal Content Understanding Verbal and Non-Verbal Content: 4. Understands (complex and basic) - clear comprehension without cues or repetitions   Memory/Recall Ability Memory/Recall Ability : Current season;That he or she is in a hospital/hospital unit;Staff names and faces   Refer to Care Plan for [place] [facility]  STGs=LTGs due to LOS   Recommendations for other services: None    Skilled Therapeutic Intervention ADL   Mobility  Bed Mobility Bed Mobility: Sit to Supine;Supine to Sit Rolling Right: Independent with assistive device Supine to Sit: Independent with assistive device Sit to Supine: Independent with assistive device Transfers Sit to Stand: Supervision/Verbal cueing Stand to Sit:  Supervision/Verbal cueing  OT assessment and training details:  Pt seen  bedside for Initial OT evaluation and training session. OT care coordination with PT who assessed earlier this am. Surgeon saw pt since and d/c'd wound vac and PICC line. Significant other "[name]" present at bedside for assistance with history, care coordination, needs, goals and d/c planning. Pt currently on liquid diet but pt reports when surgeon was in he ok'd solids. OT reinforced the orders were currently for liquid and MD may be consulting ST for clarification assessment. See pt's current LOF for BADL's as stated as pt able to transfer supine to sit to EOB, amb with RW to and from 3 in 1 commode over toilet with grab bars to attempt a BM. Pt reports not moving bowels for 10 days. Pt was unsuccessful for BM this session. Simple self care and standing level hand and mouth care sink side performed. Pt educated on basic energy conservation, falls prevention and safety. Pt requested to return to bed and left with bed alarm, call bell, needs and caregiver support.    Discharge Criteria: Patient will be discharged from OT if patient refuses treatment 3 consecutive times without medical reason, if treatment goals not met, if there is a change in medical status, if patient makes no progress towards goals or if patient is discharged from hospital.  The above assessment, treatment plan, treatment alternatives and goals were discussed and mutually agreed upon: by patient and by family  Barnabas Lister 12/09/2021, 12:06 PM

## 2021-12-04 NOTE — Plan of Care (Signed)
  Problem: RH Swallowing Goal: LTG Patient will consume least restrictive diet using compensatory strategies with assistance (SLP) Description: LTG:  Patient will consume least restrictive diet using compensatory strategies with assistance (SLP) Flowsheets (Taken 12/04/2021 1823) LTG: Pt Patient will consume least restrictive diet using compensatory strategies with assistance of (SLP): Supervision Goal: LTG Patient will participate in dysphagia therapy to increase swallow function with assistance (SLP) Description: LTG:  Patient will participate in dysphagia therapy to increase swallow function with assistance (SLP) Flowsheets (Taken 12/04/2021 1823) LTG: Pt will participate in dysphagia therapy to increase swallow function with assistance of (SLP): Supervision

## 2021-12-04 NOTE — Plan of Care (Signed)
  Problem: Consults Goal: RH GENERAL PATIENT EDUCATION Description: See Patient Education module for education specifics. Outcome: Progressing Goal: Skin Care Protocol Initiated - if Braden Score 18 or less Description: If consults are not indicated, leave blank or document N/A Outcome: Progressing   Problem: RH SKIN INTEGRITY Goal: RH STG MAINTAIN SKIN INTEGRITY WITH ASSISTANCE Description: STG Maintain Skin Integrity With Supervision Assistance. Outcome: Progressing Goal: RH STG ABLE TO PERFORM INCISION/WOUND CARE W/ASSISTANCE Description: STG Able To Perform Incision/Wound Care With Supervision Assistance. Outcome: Progressing   Problem: RH SAFETY Goal: RH STG ADHERE TO SAFETY PRECAUTIONS W/ASSISTANCE/DEVICE Description: STG Adhere to Safety Precautions With Cues and Reminders. Outcome: Progressing Goal: RH STG DECREASED RISK OF FALL WITH ASSISTANCE Description: STG Decreased Risk of Fall With Supervision Assistance. Outcome: Progressing   Problem: RH KNOWLEDGE DEFICIT GENERAL Goal: RH STG INCREASE KNOWLEDGE OF SELF CARE AFTER HOSPITALIZATION Description: Patient will demonstrate knowledge of self-care management, medication management, skin/wound care, weight bearing precautions with educational materials and handouts provided by staff independently at discharge. Outcome: Progressing

## 2021-12-04 NOTE — Evaluation (Addendum)
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   Right 1st seen 11/14; thrombosed 11/15   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   Right SFA 05/07/17 while on warfarin INR 2.9   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   Right SFA 05/07/17 while on warfarin INR 2.9   Atrial fibrillation ([place])    Benign essential HTN 01/04/2012   Chronic anticoagulation 01/02/2013   Dermatofibroma of forearm 01/02/2013   Left side   DVT, lower extremity, proximal ([place]) 05/24/2013   Right leg femoral & popliteal 05/24/13   Hyperlipidemia, mixed 01/04/2012   Hypothyroidism    Pneumonia    Polycythemia secondary to smoking 01/04/2012   Primary hypercoagulable state ([place]) 10/01/2014   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[facility])    Sinus bradycardia, chronic 01/04/2012   Superficial thrombosis of lower extremity 05/02/2012   Past Surgical History:  Past Surgical History:  Procedure Laterality Date   ABDOMINAL AORTOGRAM W/LOWER EXTREMITY Right 11/10/2021   Procedure: ABDOMINAL AORTOGRAM W/LOWER EXTREMITY;  Surgeon: Waynetta Sandy, MD;  Location: [facility];  Service: Cardiovascular;  Laterality: Right;   AORTA - BILATERAL FEMORAL ARTERY BYPASS GRAFT Bilateral 11/26/2021   Procedure: AORTO- BIILIAC ARTERY  BYPASS GRAFT USING HEMASHIELD 20 X 11MM, RIGHT ILIO-FEMORAL BYPASS  UMBILICAL HERNIA  REPAIR;  Surgeon: Cherre Robins, MD;  Location: [facility];  Service: Vascular;  Laterality: Bilateral;   APPLICATION OF WOUND VAC Right 11/26/2021   Procedure: APPLICATION OF WOUND VAC;  Surgeon: Cherre Robins, MD;  Location: [facility];  Service: Vascular;  Laterality: Right;   DIRECT LARYNGOSCOPY Left 10/19/2018   Procedure: DIRECT LARYNGOSCOPY;  Surgeon: Leta Baptist, MD;  Location: [facility];  Service: ENT;  Laterality: Left;   FEMORAL-TIBIAL BYPASS GRAFT Right 11/26/2021   Procedure: RIGHT FEMORALTIBIAL  ARTERY BYPASS GRAFT USING PROPATEN GRAFT;  Surgeon: Cherre Robins, MD;  Location: [facility];  Service: Vascular;  Laterality: Right;   IR IMAGING GUIDED PORT INSERTION  11/04/2018   IR REMOVAL TUN ACCESS W/ PORT W/O FL MOD SED  05/01/2019   TONSILLECTOMY Left 10/19/2018   Procedure: BIOPSY OF LEFT TONSIL;  Surgeon: Leta Baptist, MD;  Location: [facility];  Service: ENT;  Laterality: Left;    Assessment / Plan / Recommendation Clinical Impression History of Present Illness: Pt is a 61 y.o. male with a past medical history of popliteal aneurysm, arterial embolus and thrombosis of lower extremity, afib, HTN, HLD, hypothyroidism, sinus bradycardia, primary tonsillar squamous cell carcinoma s/p rad tx who presented 11/26/21 for aorto-bi-iliac bypass, right ilio-femoral bypass, right femoral-posterior tibial artery bypass, and umbilical hernia repair secondary to subacute ischemia of the R lower extremity with early tissue loss. Admitted to CIR on 12/04/2021.  SLP consulted to complete clinical swallow evaluation in the setting of chronic dysphagia s/p radiation tx for tonsillar SCC ~2 years ago and recent dx of PNA. Currently, pt has no alternate  means of nutrition/hydration.  Clinically, pt presents with moderate to severe oral dysphagia characterized by reduced mastication secondary to trismus (decreased mandibular ROM and strength), decreased lingual ROM and strength, as well as  seemingly incomplete A-P transit. Suspect pharyngoesophageal involvement given diminished hyolarygneal elevation and excursion upon digital palpation to thyroid notch, immediate + delayed throat clear and weak cough, in addition to multiple swallows with all consistencies assessed (thin liquid (small and larger boluses), puree, and soft solid textures). Pt utilized oral suctioning via [place] after each PO trial. From an esophageal standpoint, pt with frequent belching and regurgitation x 1, post-prandially. Per chart review, pt has been prescribed a PPI. Pt endorses hx of PNA, frequent oral suctioning at home, poor oral care routine, and difficulty with solid textures PTA. States he mainly follows a liquid diet at home due to limited mandibular ROM and strength. Reports intermittent intake of chopped green beans, french toast, and mashed potatoes. Per chart review, no MBSS nor FEES assessment has been documented. Please note, pt attributes throat clearing to be from a cyst in his sinuses that has yet to be aspirated.   Given clinical presentation and increased risk factors for development of aspiration PNA (poor oral status, hx of GERD, etc.), recommend further assessment of biomechanics of swallow function via instrumental study (MBSS) to assess if compensatory, postural, or diet modifications may be helpful in reducing s/sx c/f aspiration. Please note, pt followed up with SLP x 2 in OP setting in 2021 for dysphagia exercises as appeared to be noncompliant with recommendations. With education, pt in agreement to participate in swallow study on 12/05/2021. In the interim, may trial Dysphagia 2 diet with thin liquids. Medications administered whole with puree. Recommended use of throat clear and cough after each swallow, which pt appeared to perform independently throughout today's evaluation. Please see below for additional information.   Skilled Therapeutic Interventions          Clinical swallow evaluation  completed. Please see above for details.  SLP Assessment  Patient will need skilled Speech Lanaguage Pathology Services during CIR admission    Recommendations  SLP Diet Recommendations: Dysphagia 2 (Fine chop);Thin Liquid Administration via: Cup;Straw Medication Administration: Whole meds with puree Supervision: Patient able to self feed Compensations: Minimize environmental distractions;Slow rate;Small sips/bites;Clear throat intermittently;Clear throat after each swallow;Hard cough after swallow Postural Changes and/or Swallow Maneuvers: Upright 30-60 min after meal;Seated upright 90 degrees Oral Care Recommendations: Oral care before and after PO Patient destination: Home Follow up Recommendations: Outpatient SLP Equipment Recommended: None recommended by SLP    SLP Frequency 3 to 5 out of 7 days   SLP Duration  SLP Intensity  SLP Treatment/Interventions 7-10 days  Minumum of 1-2 x/day, 30 to 90 minutes  Patient/family education;Dysphagia/aspiration precaution training    Pain Pain Assessment Pain Scale: 0-10 Pain Score: 0-No pain  Prior Functioning Cognitive/Linguistic Baseline: Within functional limits Type of Home: House  Lives With: Significant other;Family Available Help at Discharge: Family;Available 24 hours/day Education: [employer] grad Vocation: On disability  SLP Evaluation Oral Motor Oral Motor/Sensory Function Overall Oral Motor/Sensory Function: Moderate impairment Facial ROM: Within Functional Limits Facial Symmetry: Within Functional Limits Facial Strength: Within Functional Limits Facial Sensation: Within Functional Limits Lingual ROM: Reduced right;Reduced left Lingual Symmetry: Within Functional Limits Lingual Strength: Reduced Mandible: Impaired (Trismus) Motor Speech Overall Motor Speech: Impaired at baseline Respiration: Impaired Level of Impairment: Sentence Phonation: Normal Resonance: Within functional limits Articulation:  Impaired Level of Impairment: Sentence Intelligibility: Intelligibility reduced Word: 75-100% accurate  Phrase: 75-100% accurate Sentence: 75-100% accurate Conversation: 75-100% accurate Motor Planning: Witnin functional limits Motor Speech Errors: Not applicable Interfering Components: Premorbid status;Inadequate dentition (Trismus) Effective Techniques: Slow rate;Over-articulate  Care Tool Care Tool Cognition Ability to hear (with hearing aid or hearing appliances if normally used Ability to hear (with hearing aid or hearing appliances if normally used): 0. Adequate - no difficulty in normal conservation, social interaction, listening to TV   Expression of Ideas and Wants Expression of Ideas and Wants: 4. Without difficulty (complex and basic) - expresses complex messages without difficulty and with speech that is clear and easy to understand   Understanding Verbal and Non-Verbal Content Understanding Verbal and Non-Verbal Content: 4. Understands (complex and basic) - clear comprehension without cues or repetitions  Memory/Recall Ability Memory/Recall Ability : Current season;Staff names and faces;That he or she is in a hospital/hospital unit    Bedside Swallowing Assessment General Date of Onset:  (Approximately 2 years ago) Previous Swallow Assessment: N/A from this admission Diet Prior to this Study: Other (Comment) (Full liquid with some regular foods) Temperature Spikes Noted: No Respiratory Status: Room air Behavior/Cognition: Alert;Cooperative Oral Cavity - Dentition: Poor condition;Edentulous Self-Feeding Abilities: Able to feed self Patient Positioning: Upright in bed Baseline Vocal Quality: Normal Volitional Cough: Weak Volitional Swallow: Able to elicit   Ice Chips Ice chips: Not tested Thin Liquid Thin Liquid: Impaired Presentation: Cup;Straw Oral Phase Impairments: Reduced lingual movement/coordination Pharyngeal  Phase Impairments: Multiple swallows;Wet Vocal  Quality;Throat Clearing - Immediate;Cough - Immediate;Throat Clearing - Delayed Nectar Thick Nectar Thick Liquid: Not tested Honey Thick Honey Thick Liquid: Not tested Puree Puree: Impaired Presentation: Spoon;Self Fed Oral Phase Impairments: Reduced lingual movement/coordination Pharyngeal Phase Impairments: Multiple swallows;Wet Vocal Quality;Throat Clearing - Immediate;Cough - Immediate;Throat Clearing - Delayed Solid Solid: Impaired Presentation: Self Fed Oral Phase Impairments: Impaired mastication;Reduced lingual movement/coordination Oral Phase Functional Implications: Prolonged oral transit Pharyngeal Phase Impairments: Multiple swallows;Throat Clearing - Immediate;Cough - Immediate;Wet Vocal Quality BSE Assessment Suspected Esophageal Findings Suspected Esophageal Findings: Belching;Regurgitation Risk for Aspiration Impact on safety and function: Severe aspiration risk Other Related Risk Factors: History of pneumonia;History of GERD;Deconditioning  Short Term Goals: Week 1: SLP Short Term Goal 1 (Week 1): Pt will participate in MBSS to further assess biomechanics of swallow function with 100% completion.  Refer to Care Plan for Long Term Goals  Recommendations for other services: None   Discharge Criteria: Patient will be discharged from SLP if patient refuses treatment 3 consecutive times without medical reason, if treatment goals not met, if there is a change in medical status, if patient makes no progress towards goals or if patient is discharged from hospital.  The above assessment, treatment plan, treatment alternatives and goals were discussed and mutually agreed upon: by patient and by family  Romelle Starcher A Sarabeth Benton 12/04/2021, 3:36 PM

## 2021-12-05 ENCOUNTER — Inpatient Hospital Stay (HOSPITAL_COMMUNITY): Payer: Medicaid Other

## 2021-12-05 DIAGNOSIS — E876 Hypokalemia: Secondary | ICD-10-CM | POA: Diagnosis not present

## 2021-12-05 DIAGNOSIS — R1310 Dysphagia, unspecified: Secondary | ICD-10-CM

## 2021-12-05 DIAGNOSIS — K5903 Drug induced constipation: Secondary | ICD-10-CM

## 2021-12-05 DIAGNOSIS — R5381 Other malaise: Secondary | ICD-10-CM | POA: Diagnosis not present

## 2021-12-05 LAB — BASIC METABOLIC PANEL
Anion gap: 9 (ref 5–15)
BUN: 10 mg/dL (ref 8–23)
CO2: 27 mmol/L (ref 22–32)
Calcium: 8.8 mg/dL — ABNORMAL LOW (ref 8.9–10.3)
Chloride: 104 mmol/L (ref 98–111)
Creatinine, Ser: 0.83 mg/dL (ref 0.61–1.24)
GFR, Estimated: >60 mL/min (ref >60)
Glucose, Bld: 101 mg/dL — ABNORMAL HIGH (ref 70–99)
Potassium: 3.3 mmol/L — ABNORMAL LOW (ref 3.5–5.1)
Sodium: 140 mmol/L (ref 135–145)

## 2021-12-05 LAB — MAGNESIUM: Magnesium: 1.9 mg/dL (ref 1.7–2.4)

## 2021-12-05 MED ORDER — POTASSIUM CHLORIDE 20 MEQ PO PACK
40.0000 meq | PACK | ORAL | Status: DC
  Filled 2021-12-05: qty 2

## 2021-12-05 MED ORDER — POTASSIUM CHLORIDE CRYS ER 20 MEQ PO TBCR
20.0000 meq | EXTENDED_RELEASE_TABLET | Freq: Two times a day (BID) | ORAL | Status: DC
  Administered 2021-12-06: 20 meq via ORAL
  Filled 2021-12-05: qty 1

## 2021-12-05 MED ORDER — POTASSIUM CHLORIDE CRYS ER 20 MEQ PO TBCR
40.0000 meq | EXTENDED_RELEASE_TABLET | Freq: Two times a day (BID) | ORAL | Status: DC
  Administered 2021-12-05 – 2021-12-09 (×9): 40 meq via ORAL
  Filled 2021-12-05 (×10): qty 2

## 2021-12-05 NOTE — Progress Notes (Signed)
Morning medications found in med cup at bedside. Pt states he will take medications as he pleases. Pt advised meds should be taken while nurse is in the room and at the given time due to interactions with other medications. Discarded ASA and colace due to pt not wanting to take at time nurse was present.

## 2021-12-05 NOTE — Progress Notes (Signed)
Patient and patient's family educated on importance of taking Potassium. Family and patient verbalize understanding, but patient's stays that he will take potassium as he pleases. He requested to dissolve the potassium in the milk and he says he will drink that as he pleases. Patient continues refusing his colace and Tylenol. Will continue to encourage to drink his milk with potassium. Daughter at bedside, encouraging to drink.

## 2021-12-05 NOTE — Progress Notes (Signed)
Physical Therapy Session Note  Patient Details  Name: [name] [name] MRN: [id number] Date of Birth: 07-18-1961  Today's Date: 12/05/2021 PT Individual Time: 1031-1057 PT Individual Time Calculation (min): 26 min   Short Term Goals: Week 1:  PT Short Term Goal 1 (Week 1): STG = LTG  Skilled Therapeutic Interventions/Progress Updates:    Patient received sitting up in wc, agreeable to PT. He reports some discomfort and numbness in R LE, did not rate- premedicated. PT providing rest breaks, distractions and repositioning to assist with pain management. Patient propelling himself in wc to dayroom with B UE and supervision. Kinetron at 30cm/s 3x3 mins. Patient propelling himself in wc back to his room with supervision. Needs within reach, family at bedside.   Therapy Documentation Precautions:  Precautions Precautions: Fall Precaution Comments: wound vac d/c'd by MD earlier this am, Apply abdominal binder while out of bed. Restrictions Weight Bearing Restrictions: No Other Position/Activity Restrictions: a-line d/c'd earlier   Therapy/Group: Individual Therapy  Karoline Caldwell, PT, DPT, CBIS  12/05/2021, 7:49 AM

## 2021-12-05 NOTE — Progress Notes (Signed)
Modified Barium Swallow Progress Note  Patient Details  Name: [name] [name] MRN: [id number] Date of Birth: Nov 29, 1960  Today's Date: 12/05/2021  Modified Barium Swallow completed.  Full report located under Chart Review in the Imaging Section.  Brief recommendations include the following:  Clinical Impression  Patient demonstrates a moderate to severe oropharyngeal dysphagia. Oral phase is characterized by prolonged AP transit and decreased bolus cohesion with intermittent premature spillage of thin liquids.  Solid textures were not attempted this study due to poor condition of dentition along with trismus resulting in decreased mastication and patient only consuming liquids at home per his report.  Pharyngeal phase is characterized by decreased anterior hyoid excursion and as well as significantly decreased strength of pharyngeal constrictors resulting in penetration of thin liquids during and after the swallow due to moderate pyriform sinus residue.  Patient's sensation was inconsistent at times but was essentially reliable in sensing penetrates. Patient was able to clear the majority of penetrates with spontaneous throat clears and subsequent swallows. However, cues needed for effort at time.  Increased residue noted with pureed textures and thin liquids via straw with postural changes ineffective for reducing. Trace silent aspiration observed with thin liquids via straw.        Although patient's dysphagia is chronic in nature, patient now has increased risk factors for development of aspiration PNA (deconditioning, poor oral health, increased use of straws, suboptimal positioning). Provided education with the patient, his daughter and girlfriend of increased risk factors and the importance of oral care, upright positioning with remaining upright for 30 minutes after meals, removing straws and utilizing effortful throat clears.  All verbalized understanding but suspect patient will need  reinforcement. Recommend patient continue his baseline diet of thin liquids via cup (NO STRAWS). Discussed medication administration during MBSS, patient insistent on continuing to take medications whole with thin liquids. Reinforced the importance of utilization of strategies while taking medications and splitting large pills in half or dissolving them if able. Patient verbalized understanding.   Swallow Evaluation Recommendations       SLP Diet Recommendations: Thin liquid   Liquid Administration via: Cup;No straw   Medication Administration: Whole meds with liquid   Supervision: Patient able to self feed;Intermittent supervision to cue for compensatory strategies   Compensations: Minimize environmental distractions;Slow rate;Small sips/bites;Clear throat intermittently;Clear throat after each swallow;Effortful swallow   Postural Changes: Seated upright at 90 degrees;Remain semi-upright after after feeds/meals (Comment)   Oral Care Recommendations: Oral care QID;Oral care before and after PO        [name] [name] 12/05/2021,3:10 PM

## 2021-12-05 NOTE — Progress Notes (Signed)
Physical Therapy Session Note  Patient Details  Name: [name] [name] MRN: [id number] Date of Birth: 1960/10/14  Today's Date: 12/05/2021 PT Individual Time: 0806-0850 PT Individual Time Calculation (min): 44 min   Short Term Goals: Week 1:  PT Short Term Goal 1 (Week 1): STG = LTG  Skilled Therapeutic Interventions/Progress Updates:  Patient supine in bed on entrance to room. Patient alert and agreeable to PT session. Abdominal binder delivered during session and demonstrated donning to pt and wife for wear when OOB.  Patient with no pain complaint throughout session.  Therapeutic Activity: Bed Mobility: Patient performed supine <> sit with attempt to sit straight up from supine position. Educated on and demonstrated roll to side, then push up with RUE to upright seated position on EOB. Pt requires extra time  and MinA for guidance while still attempting to sit straight up from bed surface. VC/ tc required for technique. Educated on need to decrease use of abdominal and reduce intraabdominal pressure for improved pain. Return to supine at end of session with CGA/ MinA for BLE to bed surface. MinA for reaching final positioning. Transfers: Patient performed sit<>stand and stand pivot transfers throughout session with CGA. On rise from EOB, pt relates dizziness and sits back down. HR monitored throughout session with indication of a-fib with HR fluctuating between 44 and 124 while either seated or standing. Provided verbal cues for technique and attention to symptoms.  Gait Training:  Patient ambulated 56' x1/ 46' x1 using RW with CGA. Provided vc/ tc for level gaze, consistent pace, energy conservation.  Pt with questions at end of session re: swallow study immediately after session. Informed pt re: nature of study to assess food and liquid pathway through throat to determine risk for aspiration and potential for improved texture of diet. Camera to follow path of liquid during swallowing.    Patient supine  in bed at end of session with brakes locked, bed alarm set, and all needs within reach. Transport arriving at very end of session.   Therapy Documentation Precautions:  Precautions Precautions: Fall Precaution Comments: wound vac d/c'd by MD earlier this am, Apply abdominal binder while out of bed. Restrictions Weight Bearing Restrictions: No Other Position/Activity Restrictions: a-line d/c'd earlier General:   Vital Signs: Therapy Vitals Temp: 98.7 F (37.1 C) Temp Source: Oral Pulse Rate: 86 Resp: 16 BP: (!) 135/106 Patient Position (if appropriate): Lying Oxygen Therapy SpO2: 96 % O2 Device: Room Air Pain: Pain Assessment Pain Scale: 0-10 Pain Score: 0-No pain  Therapy/Group: Individual Therapy  Alger Simons PT, DPT 12/05/2021, 8:46 AM

## 2021-12-05 NOTE — Progress Notes (Signed)
Physical Therapy Session Note  Patient Details  Name: [name] [name] MRN: [id number] Date of Birth: 03/25/1961  Today's Date: 12/05/2021 PT Individual Time: 1530-1630 PT Individual Time Calculation (min): 60 min   Short Term Goals: Week 1:  PT Short Term Goal 1 (Week 1): STG = LTG  Skilled Therapeutic Interventions/Progress Updates:   Pt received supine in bed and agreeable to PT. Supine>sit transfer without assist or cues.   Stand pivot transfer to [facility] with RW and supervision assist from PT for safety. PT assisted pt to [facility] abdominal binder to wound.   WC mobility through hall with supervision assist for safety x [id number] and [id number] for direction and doorway management only.    Gait training with RW and SO present. Supervision assist x [id number] cues for safety in turn and improved posture. No dizziness or increased pain reported by pt. .   PT instructed pt in modified [place] level strengthening exercises: LAQ, hip abduction, standing HS curl, mini-squat, sit<>stand. Performed x 10 BLE with UE support on parllel bars and cue sfor improved posture and decreased speed. Pt also performed tandem stance with BUE supported on parallel bars 2 x 10 sec with cues for decreased UE support as tolerated.   Pt returned to room and performed stand pivot transfer to bed with RW and supervision assist for safety. Sit>supine completed with without assist, and left supine in bed with call bell in reach and all needs met.        Therapy Documentation Precautions:  Precautions Precautions: Fall Precaution Comments: wound vac d/c'd by MD earlier this am, Apply abdominal binder while out of bed. Restrictions Weight Bearing Restrictions: No Other Position/Activity Restrictions: a-line d/c'd earlier  Pain: Pain Assessment Pain Scale: 0-10 Pain Score: 1  Pain Location: Foot Pain Orientation: Right Pain Descriptors / Indicators: Numbness;Tingling Pain Onset: On-going Patients Stated Pain Goal:  0 Pain Intervention(s): Ambulation/increased activity    Therapy/Group: Individual Therapy  AusLorie Phenix19/2023, 6:13 PM

## 2021-12-05 NOTE — Progress Notes (Signed)
PROGRESS NOTE   Subjective/Complaints:  He is anxious to go home. No new concerns. He had a BM yesterday.   Review of Systems  Constitutional:  Negative for chills and fever.  Respiratory:  Negative for shortness of breath.   Cardiovascular: Negative.   Gastrointestinal:  Negative for abdominal pain, constipation, diarrhea, nausea and vomiting.     Objective:   DG Chest 2 View  Result Date: 12/03/2021 CLINICAL DATA:  Cough EXAM: CHEST - 2 VIEW COMPARISON:  11/27/2021 FINDINGS: Cardiac enlargement. Negative for heart failure. Left lower lobe airspace disease unchanged. Right lung clear.  No significant pleural effusion. IMPRESSION: No interval change. Left lower lobe atelectasis or infiltrate unchanged. Electronically Signed   By: Franchot Gallo M.D.   On: 12/03/2021 18:52   Recent Labs    12/03/21 0211 12/04/21 0533  WBC 8.7 7.4  HGB 12.5* 11.7*  HCT 37.1* 35.3*  PLT 300 301    Recent Labs    12/04/21 1641 12/05/21 0520  NA 138 140  K 3.3* 3.3*  CL 102 104  CO2 28 27  GLUCOSE 100* 101*  BUN 13 10  CREATININE 0.85 0.83  CALCIUM 8.7* 8.8*     Intake/Output Summary (Last 24 hours) at 12/05/2021 0705 Last data filed at 12/05/2021 0519 Gross per 24 hour  Intake 520 ml  Output 450 ml  Net 70 ml         Physical Exam: Vital Signs Blood pressure (!) 135/106, pulse 86, temperature 98.7 F (37.1 C), temperature source Oral, resp. rate 16, height '6\' 3"'$  (1.905 m), weight 109.3 kg, SpO2 96 %.    General: Alert and oriented x 3, No apparent distress HEENT: Head is normocephalic, atraumatic, oral mucosa pink and moist, poor dentition, limited jaw movement. Wearing glasses Neck: Supple  Heart: Irregularly irregular, no murmurs noted Chest: CTA bilaterally without wheezes, rales, or rhonchi; no distress Abdomen: Soft, minimal tenderness,  mildly distended, bowel sounds positive. Large midline abdomina incision  without signs of infection R groin wound Vac, no drainage noted Extremities:  No cyanosis, Large calf incision CDI, 1+ edema Psych: Pt's affect is appropriate. Skin: warm and dry , R leg calf incision Clean and intact without signs of breakdown Neuro:  CN 2-12 intact, alert and oriented, follows commands, memory normal, sensation decreased in RLE to light touch Musculoskeletal:  Strength 5/5 in B/L UE, 4+-5/5 LLE strength  Hip flexion and Knee extension 4/5 and APF and DF 4-/5   Assessment/Plan: 1. Functional deficits which require 3+ hours per day of interdisciplinary therapy in a comprehensive inpatient rehab setting. Physiatrist is providing close team supervision and 24 hour management of active medical problems listed below. Physiatrist and rehab team continue to assess barriers to discharge/monitor patient progress toward functional and medical goals  Care Tool:  Bathing  Bathing activity did not occur: Refused (wife does/ assist with everything) Body parts bathed by patient: Right arm, Left arm, Chest, Abdomen, Front perineal area, Buttocks, Right upper leg, Left upper leg, Face   Body parts bathed by helper: Right lower leg, Left lower leg     Bathing assist Assist Level: Contact Guard/Touching assist     Upper Body  Dressing/Undressing Upper body dressing Upper body dressing/undressing activity did not occur (including orthotics):  (wife assist with everything) What is the patient wearing?: Hospital gown only    Upper body assist Assist Level: Contact Guard/Touching assist    Lower Body Dressing/Undressing Lower body dressing    Lower body dressing activity did not occur: N/A What is the patient wearing?: Underwear/pull up     Lower body assist Assist for lower body dressing: Minimal Assistance - Patient > 75%     Toileting Toileting    Toileting assist Assist for toileting: Contact Guard/Touching assist     Transfers Chair/bed transfer  Transfers assist      Chair/bed transfer assist level: Contact Guard/Touching assist     Locomotion Ambulation   Ambulation assist      Assist level: Contact Guard/Touching assist Assistive device: Walker-rolling Max distance: 62f   Walk 10 feet activity   Assist     Assist level: Contact Guard/Touching assist Assistive device: Walker-rolling   Walk 50 feet activity   Assist    Assist level: Contact Guard/Touching assist Assistive device: Walker-rolling    Walk 150 feet activity   Assist Walk 150 feet activity did not occur: Safety/medical concerns (fatigue)         Walk 10 feet on uneven surface  activity   Assist Walk 10 feet on uneven surfaces activity did not occur: Safety/medical concerns         Wheelchair     Assist Is the patient using a wheelchair?: Yes Type of Wheelchair: Manual    Wheelchair assist level: Supervision/Verbal cueing Max wheelchair distance: 539f   Wheelchair 50 feet with 2 turns activity    Assist        Assist Level: Supervision/Verbal cueing   Wheelchair 150 feet activity     Assist  Wheelchair 150 feet activity did not occur: Safety/medical concerns (fatigue)       Blood pressure (!) 135/106, pulse 86, temperature 98.7 F (37.1 C), temperature source Oral, resp. rate 16, height '6\' 3"'$  (1.905 m), weight 109.3 kg, SpO2 96 %.  1. 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  PAF/DVTs/Antithrombotics: -Anticoagulation:  Pharmaceutical: Xarelto resumed 05/16             -antiplatelet therapy: ASA 3. Pain Management: Tylenol TID in addition to oxycodone prn 4. Mood: LCSW to follow for evaluation and support.               -antipsychotic agents: N/A 5. Neuropsych: This patient is capable of making decisions on his own behalf. 6. Skin/Wound Care: Incisional VAC to stay in place 7-10 days per Dr. HaStanford Breed--Routine pressure relief measures.              --Add ensure TID for supplement.              -Continue to monitor surgical incisions  -Dr haLuan Pullingollowing, appreciate assistance, twice daily dressing changes started 7. Fluids/Electrolytes/Nutrition:  Monitor I/O and for signs of overload. 8. PAF: Monitor HR TID and for any symptoms with activity-->has been up to 160's             --  On metoprolol 25 mg/day, continue 9. Fluid overload: Treated with lasix X 2 doses as well as runs of KCL for hypokalemia.  --Will order daily weights.  --recheck Mg level in am.  10. ABLA: Recheck CBC in am.              -5/18 Hemoglobin 11.7, stable follow 11. Abnormal LFTs: Resolving ALT 291-->68 and on statin. --5/18 ALT 31, resolved 12. Recent PNA/ ongoing Cough: Will recheck CXR to monitor for clearance. 13. Constipation: Will increase miralax to BID.     -5/19 Reports some improvement with BM yesterday, follow 14. Tobacco use Advised to discontinue 15. Marijuana use. Advised to discontinue 16. Hypokalemia, asymptomatic   -5/18 He declined IV, Will  do 3 doses KCL packets, recheck labs  -5/19 K + improved to 3.3 this am, he is scheduled for 72mq x2 today with recheck K+ tomorrow 17. Dysphagia with Trismus and poor dentition  -5/18 place SLP consult for assistance with diet choice and trismus therapy  -5/19 SLP completed MBS, has restarted thin liquids via cup                LOS: 2 days A FACE TO FACE EVALUATION WAS PERFORMED  YJennye Boroughs5/19/2023, 7:05 AM

## 2021-12-05 NOTE — Plan of Care (Signed)
  Problem: Consults Goal: RH GENERAL PATIENT EDUCATION Description: See Patient Education module for education specifics. Outcome: Progressing Goal: Skin Care Protocol Initiated - if Braden Score 18 or less Description: If consults are not indicated, leave blank or document N/A Outcome: Progressing   Problem: RH SKIN INTEGRITY Goal: RH STG MAINTAIN SKIN INTEGRITY WITH ASSISTANCE Description: STG Maintain Skin Integrity With Supervision Assistance. Outcome: Progressing Goal: RH STG ABLE TO PERFORM INCISION/WOUND CARE W/ASSISTANCE Description: STG Able To Perform Incision/Wound Care With Supervision Assistance. Outcome: Progressing   Problem: RH SAFETY Goal: RH STG ADHERE TO SAFETY PRECAUTIONS W/ASSISTANCE/DEVICE Description: STG Adhere to Safety Precautions With Cues and Reminders. Outcome: Progressing Goal: RH STG DECREASED RISK OF FALL WITH ASSISTANCE Description: STG Decreased Risk of Fall With Supervision Assistance. Outcome: Progressing   Problem: RH KNOWLEDGE DEFICIT GENERAL Goal: RH STG INCREASE KNOWLEDGE OF SELF CARE AFTER HOSPITALIZATION Description: Patient will demonstrate knowledge of self-care management, medication management, skin/wound care, weight bearing precautions with educational materials and handouts provided by staff independently at discharge. Outcome: Progressing

## 2021-12-05 NOTE — Progress Notes (Signed)
Occupational Therapy Session Note  Patient Details  Name: [name] [name] MRN: [id number] Date of Birth: 1961-01-04  Today's Date: 12/05/2021 OT Individual Time: 0935-1030 OT Individual Time Calculation (min): 55 min    Short Term Goals: Week 1:  STGs=LTG's due to ELOS   Skilled Therapeutic Interventions/Progress Updates:  Earlier AM, OT assisted pt to acquire abdominal binder and educated on wearing orders for all OOB activity. Pt at swallow study and then transported back to room for OT session. SN and SLP present and SLP reported results of swallow study to place pt back on thin liquids only with cup no straw. OT provided educ with significant other and daughter [name] present re: this information and need for abdominal binder, falls prevention and care coordination for POC. Both family members left and OT assisted pt to apply abdominal binder, OOB with close S to toilet and then sink side for self care training via RW support with CGA. Pt was able to complete urination seated with CGA for standing for LB garment mngt, UB bathing and dressing with set up and Close S. LB bathing and dressing with min A with intermittent standing with RW with CGA. Mouth care with suction tooth brush and hair care with set up seated. OT readjusted abdominal binder then handed pt off to PT for their session.     Therapy/Group: Individual Therapy  Barnabas Lister 12/05/2021, 3:38 PM

## 2021-12-05 NOTE — Progress Notes (Signed)
On call provider was notified regarding Potassium . No new orders at this time.

## 2021-12-06 DIAGNOSIS — I998 Other disorder of circulatory system: Secondary | ICD-10-CM | POA: Diagnosis not present

## 2021-12-06 DIAGNOSIS — I480 Paroxysmal atrial fibrillation: Secondary | ICD-10-CM | POA: Diagnosis not present

## 2021-12-06 DIAGNOSIS — R5381 Other malaise: Secondary | ICD-10-CM | POA: Diagnosis not present

## 2021-12-06 DIAGNOSIS — D62 Acute posthemorrhagic anemia: Secondary | ICD-10-CM | POA: Diagnosis not present

## 2021-12-06 LAB — BASIC METABOLIC PANEL
Anion gap: 9 (ref 5–15)
BUN: 10 mg/dL (ref 8–23)
CO2: 26 mmol/L (ref 22–32)
Calcium: 8.6 mg/dL — ABNORMAL LOW (ref 8.9–10.3)
Chloride: 105 mmol/L (ref 98–111)
Creatinine, Ser: 0.74 mg/dL (ref 0.61–1.24)
GFR, Estimated: >60 mL/min (ref >60)
Glucose, Bld: 96 mg/dL (ref 70–99)
Potassium: 3.3 mmol/L — ABNORMAL LOW (ref 3.5–5.1)
Sodium: 140 mmol/L (ref 135–145)

## 2021-12-06 MED ORDER — MAGNESIUM GLUCONATE 500 MG PO TABS
250.0000 mg | ORAL_TABLET | Freq: Every day | ORAL | Status: DC
  Filled 2021-12-06 (×2): qty 1

## 2021-12-06 NOTE — Progress Notes (Signed)
Occupational Therapy Session Note  Patient Details  Name: [name] [name] MRN: [id number] Date of Birth: April 14, 1961  Today's Date: 12/06/2021 OT Individual Time: 1125-1203 OT Individual Time Calculation (min): 38 min  and Today's Date: 12/06/2021 OT Missed Time: 20 Minutes Missed Time Reason: Nursing care   Short Term Goals: Week 1:     Skilled Therapeutic Interventions/Progress Updates:    Pt received semi-reclined in bed with daughter present, denies pain, agreeable to therapy, but requesting dressing change to abdominal incisions prior to therapy. LPN notified and present for dressing change/pt missing 20 min of scheduled OT as a result.  BP read in supine at 121/90 (100). Pt came to sitting EOB close S and donned abdominal binder with min A for set-up A. MD in/out morning rounds. Stood with CGA no AD, BP read at 99/81 (88), endorses wooziness. Sat at sink to shave per pt request with set-up A of chair.   Educated daughter on w/c parts management/bathroom transfers with use of RW + need for abdominal binder OOB as pt now has grounds pass. Daughter able to return demonstration for w/c part management, rec that pt stay in w/c if leaving room due to hx of orthostatic hypotension. Cleared on transfer plan.   Pt facilitated stand-pivot transfer > w/c at S level, cues for w/c part management. Pt self-propelled self with min A to exit doorway > hospital atrium with S assist. Transported dep back to room due to fatigue.  Ambulatory toilet transfer with RW and close S due to feeling woozy earlier, continent void of bladder.  Pt left seated EOB with daughter present, call bell in reach, and all immediate needs met.    Therapy Documentation Precautions:  Precautions Precautions: Fall Precaution Comments: wound vac d/c'd by MD earlier this am, Apply abdominal binder while out of bed. Restrictions Weight Bearing Restrictions: No Other Position/Activity Restrictions: a-line d/c'd  earlier  Pain:  denies ADL: See Care Tool for more details.   Therapy/Group: Individual Therapy  Volanda Napoleon MS, OTR/L  12/06/2021, 7:01 AM

## 2021-12-06 NOTE — Progress Notes (Signed)
PROGRESS NOTE   Subjective/Complaints: Anxious to go home Daughter present Discussed goals before he can go home He would like his wound to continue to heal   Review of Systems  Constitutional:  Negative for chills and fever.  Respiratory:  Negative for shortness of breath.   Cardiovascular: Negative.   Gastrointestinal:  Negative for abdominal pain, constipation, diarrhea, nausea and vomiting. Denies pain    Objective:   No results found. Recent Labs    12/04/21 0533  WBC 7.4  HGB 11.7*  HCT 35.3*  PLT 301    Recent Labs    12/05/21 0520 12/06/21 0527  NA 140 140  K 3.3* 3.3*  CL 104 105  CO2 27 26  GLUCOSE 101* 96  BUN 10 10  CREATININE 0.83 0.74  CALCIUM 8.8* 8.6*     Intake/Output Summary (Last 24 hours) at 12/06/2021 1424 Last data filed at 12/06/2021 1251 Gross per 24 hour  Intake 120 ml  Output 400 ml  Net -280 ml         Physical Exam: Vital Signs Blood pressure (!) 122/91, pulse 96, temperature 97.8 F (36.6 C), temperature source Oral, resp. rate 17, height '6\' 3"'$  (1.905 m), weight 107.2 kg, SpO2 97 %.  Gen: no distress, normal appearing HEENT: oral mucosa pink and moist, NCAT Cardio: Reg rate Chest: normal effort, normal rate of breathing  Abdomen: Soft, minimal tenderness,  mildly distended, bowel sounds positive. Large midline abdomina incision without signs of infection R groin wound Vac, no drainage noted Extremities:  No cyanosis, Large calf incision CDI, 1+ edema Psych: Pt's affect is appropriate. Skin: warm and dry , R leg calf incision Clean and intact without signs of breakdown Neuro:  CN 2-12 intact, alert and oriented, follows commands, memory normal, sensation decreased in RLE to light touch Musculoskeletal:  Strength 5/5 in B/L UE, 4+-5/5 LLE strength  Hip flexion and Knee extension 4/5 and APF and DF 4-/5   Assessment/Plan: 1. Functional deficits which require 3+  hours per day of interdisciplinary therapy in a comprehensive inpatient rehab setting. Physiatrist is providing close team supervision and 24 hour management of active medical problems listed below. Physiatrist and rehab team continue to assess barriers to discharge/monitor patient progress toward functional and medical goals  Care Tool:  Bathing  Bathing activity did not occur: Refused (wife does/ assist with everything) Body parts bathed by patient: Right arm, Left arm, Chest, Abdomen, Front perineal area, Buttocks, Right upper leg, Left upper leg, Face   Body parts bathed by helper: Right lower leg, Left lower leg     Bathing assist Assist Level: Contact Guard/Touching assist     Upper Body Dressing/Undressing Upper body dressing Upper body dressing/undressing activity did not occur (including orthotics):  (wife assist with everything) What is the patient wearing?: Pull over shirt    Upper body assist Assist Level: Supervision/Verbal cueing    Lower Body Dressing/Undressing Lower body dressing    Lower body dressing activity did not occur: N/A What is the patient wearing?: Underwear/pull up     Lower body assist Assist for lower body dressing: Minimal Assistance - Patient > 75%     Toileting Toileting  Toileting assist Assist for toileting: Independent with assistive device Assistive Device Comment: urinal   Transfers Chair/bed transfer  Transfers assist     Chair/bed transfer assist level: Contact Guard/Touching assist     Locomotion Ambulation   Ambulation assist      Assist level: Contact Guard/Touching assist Assistive device: Walker-rolling Max distance: 68f   Walk 10 feet activity   Assist     Assist level: Contact Guard/Touching assist Assistive device: Walker-rolling   Walk 50 feet activity   Assist    Assist level: Contact Guard/Touching assist Assistive device: Walker-rolling    Walk 150 feet activity   Assist Walk 150  feet activity did not occur: Safety/medical concerns (fatigue)         Walk 10 feet on uneven surface  activity   Assist Walk 10 feet on uneven surfaces activity did not occur: Safety/medical concerns         Wheelchair     Assist Is the patient using a wheelchair?: Yes Type of Wheelchair: Manual    Wheelchair assist level: Supervision/Verbal cueing Max wheelchair distance: 537f   Wheelchair 50 feet with 2 turns activity    Assist        Assist Level: Supervision/Verbal cueing   Wheelchair 150 feet activity     Assist  Wheelchair 150 feet activity did not occur: Safety/medical concerns (fatigue)       Blood pressure (!) 122/91, pulse 96, temperature 97.8 F (36.6 C), temperature source Oral, resp. rate 17, height '6\' 3"'$  (1.905 m), weight 107.2 kg, SpO2 97 %.  1. 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  PAF/DVTs/Antithrombotics: -Anticoagulation:  Pharmaceutical: Xarelto resumed 05/16             -antiplatelet therapy: ASA 3. Pain Management: Tylenol TID in addition to oxycodone prn 4. Mood: LCSW to follow for evaluation and support.              -antipsychotic agents: N/A 5. Neuropsych: This patient is capable of making decisions on his own behalf. 6. Skin/Wound Care: Incisional VAC to stay in place 7-10 days per Dr. HaStanford Breed--Routine pressure relief measures.              --Add ensure TID for supplement.              -Continue to monitor surgical incisions  -Dr haLuan Pullingollowing, appreciate assistance, twice daily dressing changes started 7. Fluids/Electrolytes/Nutrition:  Monitor I/O and for signs of overload. 8. PAF: Monitor HR TID and for any  symptoms with activity-->has been up to 160's             --On metoprolol 25 mg/day, continue 9. Fluid overload: Treated with lasix X 2 doses as well as runs of KCL for hypokalemia.  --Will order daily weights.  --recheck Mg level in am.  10. ABLA: Recheck CBC in am.              -5/18 Hemoglobin 11.7, stable follow 11. Abnormal LFTs: Resolving ALT 291-->68 and on statin. --5/18 ALT 31, resolved 12. Recent PNA/ ongoing Cough: Will  recheck CXR to monitor for clearance. 13. Constipation: Will increase miralax to BID.   Add magnesium gluconate '250mg'$  HS 14. Tobacco use Advised to discontinue 15. Marijuana use. Advised to discontinue 16. Hypokalemia, asymptomatic, continue klor 32mq BID.  17. Dysphagia with Trismus and poor dentition  -5/18 place SLP consult for assistance with diet choice and trismus therapy  -5/19 SLP completed MBS, has restarted thin liquids via cup 18. HTN: add magnesium gluconate '250mg'$  HS                LOS: 3 days A FACE TO FACE EVALUATION WAS PERFORMED  KClide DeutscherRaulkar 12/06/2021, 2:24 PM

## 2021-12-06 NOTE — Progress Notes (Signed)
Physical Therapy Session Note  Patient Details  Name: [name] [name] MRN: [id number] Date of Birth: 06-14-1961  Today's Date: 12/06/2021 PT Individual Time: 2258-3462 PT Individual Time Calculation (min): 26 min   Short Term Goals: Week 1:  PT Short Term Goal 1 (Week 1): STG = LTG  Skilled Therapeutic Interventions/Progress Updates: Pt presented in bed agreeable to therapy. Pt states some pain in abdominal area, did not rate and rest breaks provided as needed for pain management. Pt performed supine to sit with supervision and use of bed features. Pt donned abdominal binder with minA. Per pt request would like to try ambulation with shoes, pt was able to don shoes with set up. Pt then ambulated from room to day room before requesting to sit. After brief rest pt ambulated day room to rehab gym. Pt indicated no significant fatigue. Performed w/c mobility with BLE to 4MW nsg station then maintained BLE extended and propelled with BUE back to rehab gym. Discussed energy conservation and importance of maintaining mobility as pt expressed desire to return home quickly. Pt then ambulated back to room with RW and CGA >200 ft. Pt doffed shoes at EOB mod I and returned to supine with supervision. Pt left in bed at end of session with bed alarm on, call bell within reach and needs met.      Therapy Documentation Precautions:  Precautions Precautions: Fall Precaution Comments: wound vac d/c'd by MD earlier this am, Apply abdominal binder while out of bed. Restrictions Weight Bearing Restrictions: No Other Position/Activity Restrictions: a-line d/c'd earlier General:   Vital Signs: Therapy Vitals Temp: 97.8 F (36.6 C) Temp Source: Oral Pulse Rate: 96 Resp: 17 BP: (!) 122/91 Patient Position (if appropriate): Lying Oxygen Therapy SpO2: 97 % O2 Device: Room Air Pain: Pain Assessment Pain Scale: 0-10 Pain Score: 0-No pain Mobility:   Locomotion :    Trunk/Postural Assessment :     Balance:   Exercises:   Other Treatments:      Therapy/Group: Individual Therapy  [name] 12/06/2021, 3:54 PM

## 2021-12-06 NOTE — Progress Notes (Signed)
Speech Language Pathology Daily Session Note  Patient Details  Name: [name] [name] MRN: [id number] Date of Birth: [birth year]  Today's Date: 12/06/2021 SLP Individual Time: 1300-1330 SLP Individual Time Calculation (min): 30 min  Short Term Goals: Week 1: SLP Short Term Goal 1 (Week 1): Pt will participate in MBSS to further assess biomechanics of swallow function with 100% completion.  Skilled Therapeutic Interventions:  Pt was seen for skilled ST targeting dysphagia goals.  SLP reviewed and reiterated results of MBS with pt and rationale for current swallow precautions as pt needed min cues to recall.  After reviewing precautions, pt used them with supervision while consuming cup sips of thin liquids.  No overt s/s of aspiration were evident with thin liquids via cup sips when precautions were used. Pt's daughter was present and reported concerns over pt being able to maintain hydration and nutrition orally but understood rationale for current diet  Pt was left in bed with bed alarm set and call bell within reach.  Continue per current plan of care.    Pain Pain Assessment Pain Scale: 0-10 Pain Score: 0-No pain  Therapy/Group: Individual Therapy  [name], Selinda Orion 12/06/2021, 2:34 PM

## 2021-12-06 NOTE — Progress Notes (Signed)
Physical Therapy Session Note  Patient Details  Name: [name] [name] MRN: [id number] Date of Birth: Jul 23, 1960  Today's Date: 12/06/2021 PT Individual Time: 0805-0914 PT Individual Time Calculation (min): 69 min   Short Term Goals: Week 1:  PT Short Term Goal 1 (Week 1): STG = LTG  Skilled Therapeutic Interventions/Progress Updates:   Pt received supine in bed and agreeable to PT. Supine>sit transfer without assist or cues. Donning binder sitting EOB.   Stand pivot transfer to [facility] with supervision assist.   Pt transported to rehab gym in [facility]. PT performed orthostatic VS assessment: Sitting: 100/72 HR 82 Standing 0 min: 81/64 HR 102  Standing 2 min: 96/75 HR 118  Sitting 112/80 HR 91  Sitting following gait training 102/74 HR 85.   Gait training with RW to weave through 8 cones with supervision assist and cues for AD on floor in turns for safety, but no LOB when AD in air.   Gait training over level surface without AD 53f x 2 with CGA-min assist with mild LOB in turn, but able to correct without addition assist.   Dynamic balance training in parallel bars:  Reciprocal foot taps on 6 inch cone x 8 BLE with BUE support on parallel bars and x 8 BLE with intermittent finger tip support on rails. Lateral foot taps x 8 Bil with BUE support on parallel bar.  Semitandem standing 2 x 30 sec bil with no UE support. CGA for balance throughout with cues for posture and ankle strategy to prevent LOB  Kinetron BLE endurance training 30cm/sec x 8 minutes with cues for full ROM initially.   Pt returned to room and performed stand pivot transfer to bed with RW and supervision assist. Sit>supine completed without assist, and left supine in bed with call bell in reach and all needs met.         Therapy Documentation Precautions:  Precautions Precautions: Fall Precaution Comments: wound vac d/c'd by MD earlier this am, Apply abdominal binder while out of bed. Restrictions Weight Bearing  Restrictions: No Other Position/Activity Restrictions: a-line d/c'd earlier  Vital Signs: Therapy Vitals Temp: 98.4 F (36.9 C) Pulse Rate: 93 Resp: 16 BP: (!) 118/97 Patient Position (if appropriate): Lying Oxygen Therapy SpO2: 96 % O2 Device: Room Air Pain: Pain Assessment Pain Scale: 0-10 Pain Score: 0-No pain   Therapy/Group: Individual Therapy  ALorie Phenix5/20/2023, 9:15 AM

## 2021-12-06 NOTE — IPOC Note (Addendum)
Overall Plan of Care [facility]) Patient Details Name: [name] [name] MRN: [id number] DOB: 1961/06/18  Admitting Diagnosis: [place] [facility] Problems: Principal Problem:   Debility Active Problems:   ABLA (acute blood loss anemia)   Atrial fibrillation (HCC)   Marijuana abuse   Ischemia of extremity     Functional Problem List: Nursing Edema, Endurance, Medication Management, Motor, Safety, Skin Integrity  PT Balance, Edema, Endurance, Motor, Safety, Sensory, Skin Integrity  OT Balance, Pain, Endurance, Skin Integrity, Motor, Nutrition  SLP Nutrition  TR         Basic ADL's: OT Grooming, Bathing, Dressing, Toileting     Advanced  ADL's: OT Simple Meal Preparation, Light Housekeeping     Transfers: PT Bed Mobility, Car, Bed to Chair  OT Toilet, Tub/Shower     Locomotion: PT Ambulation, Stairs     Additional Impairments: OT None  SLP Swallowing      TR      Anticipated Outcomes Item Anticipated Outcome  Self Feeding    Swallowing  Sup A   Basic self-care  mod I  Toileting  mod I   Bathroom Transfers mod I  Bowel/Bladder  n/a  Transfers  Mod I with LRAD  Locomotion  supervision with LRAD  Communication  N/A  Cognition  N/A  Pain  n/a  Safety/Judgment  supervision   Therapy Plan: PT Intensity: Minimum of 1-2 x/day ,45 to 90 minutes PT Frequency: 5 out of 7 days PT Duration Estimated Length of Stay: 7-9 days OT Intensity: Minimum of 1-2 x/day, 45 to 90 minutes OT Frequency: 5 out of 7 days OT Duration/Estimated Length of Stay: 7-9 day SLP Intensity: Minumum of 1-2 x/day, 30 to 90 minutes SLP Frequency: 3 to 5 out of 7 days SLP Duration/Estimated Length of Stay: 7-10 days   Team Interventions: Nursing Interventions Patient/Family Education, Disease Management/Prevention, Medication Management, Skin Care/Wound Management, Discharge Planning  PT interventions Ambulation/gait training, Discharge planning, Functional mobility training,  Psychosocial support, Therapeutic Activities, Visual/perceptual remediation/compensation, Wheelchair propulsion/positioning, Therapeutic Exercise, Skin care/wound management, Neuromuscular re-education, Disease management/prevention, Training and development officer, Cognitive remediation/compensation, DME/adaptive equipment instruction, Pain management, Splinting/orthotics, UE/LE Strength taining/ROM, UE/LE Coordination activities, Stair training, Patient/family education, Functional electrical stimulation, Community reintegration  OT Interventions Training and development officer, Disease mangement/prevention, Academic librarian, Barrister's clerk education, Self Care/advanced ADL retraining, Therapeutic Exercise, Discharge planning, DME/adaptive equipment instruction, Functional mobility training, Pain management, Skin care/wound managment, Therapeutic Activities, UE/LE Strength taining/ROM  SLP Interventions Patient/family education, Dysphagia/aspiration precaution training  TR Interventions    SW/CM Interventions Discharge Planning, Psychosocial Support, Patient/Family Education   Barriers to Discharge MD  Medical stability  Nursing Decreased caregiver support, Home environment access/layout, Wound Care, Insurance for SNF coverage, Weight, Weight bearing restrictions 2 level, 4 steps, 2 rails. Full flight stairs to 2nd level, 2 rails. Spouse can provide min assist.  PT Home environment access/layout, Decreased caregiver support, Insurance for SNF coverage    OT Wound Care, Lack of/limited family support Significant other is an Therapist, sports but works weekends  [place], Lack of/limited family support, Insurance underwriter for SNF coverage, Medication compliance     Team Discharge Planning: Destination: PT-Home ,OT- Home , SLP-Home Projected Follow-up: PT-24 hour supervision/assistance, OT-  Home health OT, SLP-Outpatient SLP Projected Equipment Needs: PT-To be determined, OT- 3 in 1 bedside comode,  Rolling walker with 5" wheels, SLP-None recommended by SLP Equipment Details: PT- , OT-pt has shower seat, needs BSC and RW Patient/family involved in discharge planning: PT- Patient, Family member/caregiver,  OT-Patient, Family member/caregiver, SLP-Patient, Family member/caregiver  MD ELOS: 12-14 days modI Medical Rehab Prognosis:  Excellent Assessment: The patient has been admitted for CIR therapies with the diagnosis of 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 The team will be addressing functional mobility, strength, stamina, balance, safety, adaptive techniques and equipment, self-care, bowel and bladder mgt, patient and caregiver education. Goals have been set at [place]. Anticipated discharge destination is home.        See Team Conference Notes for weekly updates to the plan of care

## 2021-12-07 DIAGNOSIS — I480 Paroxysmal atrial fibrillation: Secondary | ICD-10-CM | POA: Diagnosis not present

## 2021-12-07 DIAGNOSIS — D62 Acute posthemorrhagic anemia: Secondary | ICD-10-CM | POA: Diagnosis not present

## 2021-12-07 DIAGNOSIS — I998 Other disorder of circulatory system: Secondary | ICD-10-CM | POA: Diagnosis not present

## 2021-12-07 DIAGNOSIS — R5381 Other malaise: Secondary | ICD-10-CM | POA: Diagnosis not present

## 2021-12-07 NOTE — Progress Notes (Signed)
PROGRESS NOTE   Subjective/Complaints: No new complaints   Review of Systems  Constitutional:  Negative for chills and fever.  Respiratory:  Negative for shortness of breath.   Cardiovascular: Negative.   Gastrointestinal:  Negative for abdominal pain, constipation, diarrhea, nausea and vomiting. Denies pain    Objective:   No results found. No results for input(s): WBC, HGB, HCT, PLT in the last 72 hours.  Recent Labs    12/05/21 0520 12/06/21 0527  NA 140 140  K 3.3* 3.3*  CL 104 105  CO2 27 26  GLUCOSE 101* 96  BUN 10 10  CREATININE 0.83 0.74  CALCIUM 8.8* 8.6*     Intake/Output Summary (Last 24 hours) at 12/07/2021 1937 Last data filed at 12/07/2021 1816 Gross per 24 hour  Intake 360 ml  Output 300 ml  Net 60 ml         Physical Exam: Vital Signs Blood pressure 116/85, pulse 91, temperature 98.2 F (36.8 C), temperature source Oral, resp. rate 16, height '6\' 3"'$  (1.905 m), weight 111.6 kg, SpO2 97 %. Gen: no distress, normal appearing HEENT: oral mucosa pink and moist, NCAT Cardio: Reg rate Chest: normal effort, normal rate of breathing Abdomen: Soft, minimal tenderness,  mildly distended, bowel sounds positive. Large midline abdomina incision without signs of infection R groin wound Vac, no drainage noted Extremities:  No cyanosis, Large calf incision CDI, 1+ edema Psych: Pt's affect is appropriate. Skin: warm and dry , R leg calf incision Clean and intact without signs of breakdown Neuro:  CN 2-12 intact, alert and oriented, follows commands, memory normal, sensation decreased in RLE to light touch Musculoskeletal:  Strength 5/5 in B/L UE, 4+-5/5 LLE strength  Hip flexion and Knee extension 4/5 and APF and DF 4-/5   Assessment/Plan: 1. Functional deficits which require 3+ hours per day of interdisciplinary therapy in a comprehensive inpatient rehab setting. Physiatrist is providing close team  supervision and 24 hour management of active medical problems listed below. Physiatrist and rehab team continue to assess barriers to discharge/monitor patient progress toward functional and medical goals  Care Tool:  Bathing  Bathing activity did not occur: Refused (wife does/ assist with everything) Body parts bathed by patient: Right arm, Left arm, Chest, Abdomen, Front perineal area, Buttocks, Right upper leg, Left upper leg, Face   Body parts bathed by helper: Right lower leg, Left lower leg     Bathing assist Assist Level: Contact Guard/Touching assist     Upper Body Dressing/Undressing Upper body dressing Upper body dressing/undressing activity did not occur (including orthotics):  (wife assist with everything) What is the patient wearing?: Pull over shirt    Upper body assist Assist Level: Supervision/Verbal cueing    Lower Body Dressing/Undressing Lower body dressing    Lower body dressing activity did not occur: N/A What is the patient wearing?: Underwear/pull up     Lower body assist Assist for lower body dressing: Minimal Assistance - Patient > 75%     Toileting Toileting    Toileting assist Assist for toileting: Independent with assistive device Assistive Device Comment: urinal   Transfers Chair/bed transfer  Transfers assist     Chair/bed transfer  assist level: Contact Guard/Touching assist     Locomotion Ambulation   Ambulation assist      Assist level: Contact Guard/Touching assist Assistive device: Walker-rolling Max distance: 60f   Walk 10 feet activity   Assist     Assist level: Contact Guard/Touching assist Assistive device: Walker-rolling   Walk 50 feet activity   Assist    Assist level: Contact Guard/Touching assist Assistive device: Walker-rolling    Walk 150 feet activity   Assist Walk 150 feet activity did not occur: Safety/medical concerns (fatigue)         Walk 10 feet on uneven surface   activity   Assist Walk 10 feet on uneven surfaces activity did not occur: Safety/medical concerns         Wheelchair     Assist Is the patient using a wheelchair?: Yes Type of Wheelchair: Manual    Wheelchair assist level: Supervision/Verbal cueing Max wheelchair distance: 543f   Wheelchair 50 feet with 2 turns activity    Assist        Assist Level: Supervision/Verbal cueing   Wheelchair 150 feet activity     Assist  Wheelchair 150 feet activity did not occur: Safety/medical concerns (fatigue)       Blood pressure 116/85, pulse 91, temperature 98.2 F (36.8 C), temperature source Oral, resp. rate 16, height '6\' 3"'$  (1.905 m), weight 111.6 kg, SpO2 97 %.  1. 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  PAF/DVTs: -Anticoagulation:  Pharmaceutical: Xarelto resumed 05/16             -antiplatelet therapy: continue ASA 3. Pain: Continue Tylenol TID in addition to oxycodone prn 4. Mood: LCSW to follow for evaluation and support.              -antipsychotic agents: N/A 5. Neuropsych: This patient is capable of making decisions on his own behalf. 6. Skin/Wound Care: Incisional VAC to stay in place 7-10 days per Dr. HaStanford Breed--Routine pressure relief measures.              --Add ensure TID for supplement.              -Continue to monitor surgical incisions  -Dr haLuan Pullingollowing, appreciate assistance, twice daily dressing changes started 7. Fluids/Electrolytes/Nutrition:  Monitor I/O and for signs of overload. 8. PAF: Monitor HR TID and for any symptoms with activity-->has been up to 160's             --Continue metoprolol 25 mg/day 9. Fluid overload: Treated  with lasix X 2 doses as well as runs of KCL for hypokalemia.  --Will order daily weights.  --recheck Mg level in am.  10. ABLA: Recheck CBC in am.              -5/18 Hemoglobin 11.7, stable follow 11. Abnormal LFTs: Resolving ALT 291-->68 and on statin. --5/18 ALT 31, resolved 12. Recent PNA/ ongoing Cough: Will recheck CXR to monitor for clearance. 13. Constipation: Will increase miralax to BID.   Add magnesium gluconate '250mg'$  HS 14. Tobacco use Advised to discontinue 15. Marijuana  use. Advised to discontinue 16. Hypokalemia, asymptomatic, continue klor 53mq BID.  17. Dysphagia with Trismus and poor dentition  -5/18 place SLP consult for assistance with diet choice and trismus therapy  -5/19 SLP completed MBS, has restarted thin liquids via cup 18. HTN: add magnesium gluconate '250mg'$  HS                LOS: 4 days A FACE TO FACE EVALUATION WAS PERFORMED  KIzora Ribas5/21/2023, 7:37 PM

## 2021-12-08 ENCOUNTER — Other Ambulatory Visit: Payer: Medicaid Other | Admitting: Student

## 2021-12-08 LAB — BASIC METABOLIC PANEL
Anion gap: 8 (ref 5–15)
BUN: 7 mg/dL — ABNORMAL LOW (ref 8–23)
CO2: 25 mmol/L (ref 22–32)
Calcium: 8.6 mg/dL — ABNORMAL LOW (ref 8.9–10.3)
Chloride: 106 mmol/L (ref 98–111)
Creatinine, Ser: 0.87 mg/dL (ref 0.61–1.24)
GFR, Estimated: >60 mL/min (ref >60)
Glucose, Bld: 97 mg/dL (ref 70–99)
Potassium: 3.4 mmol/L — ABNORMAL LOW (ref 3.5–5.1)
Sodium: 139 mmol/L (ref 135–145)

## 2021-12-08 LAB — CBC
HCT: 34.6 % — ABNORMAL LOW (ref 39.0–52.0)
Hemoglobin: 11.6 g/dL — ABNORMAL LOW (ref 13.0–17.0)
MCH: 30.4 pg (ref 26.0–34.0)
MCHC: 33.5 g/dL (ref 30.0–36.0)
MCV: 90.6 fL (ref 80.0–100.0)
Platelets: 277 10*3/uL (ref 150–400)
RBC: 3.82 MIL/uL — ABNORMAL LOW (ref 4.22–5.81)
RDW: 13.9 % (ref 11.5–15.5)
WBC: 7.3 10*3/uL (ref 4.0–10.5)
nRBC: 0 % (ref 0.0–0.2)

## 2021-12-08 NOTE — Progress Notes (Addendum)
[facility]  Subjective  - small drainage distal abdominal incision   Objective 121/76 97 98.3 F (36.8 C) 20 93%  Intake/Output Summary (Last 24 hours) at 12/08/2021 6144 Last data filed at 12/08/2021 3154 Gross per 24 hour  Intake 360 ml  Output 800 ml  Net -440 ml      No active drainage, skin without erythema or dehiscence of incision Moving extremities well distally B feet well perfused Incision on the right LE healing well 4 x4 dressing to distal abdomin an right groin for padding   Assessment/Planning: [name] [name] is a 61 y.o. male status post: 1) aorto-bi-iliac bypass (20x80m Dacron) 2) right ilio-femoral bypass (819mDacron) 3) right femoral - posterior tibial artery bypass (6m75m4) umbilical hernia repair  Slowly increasing mobility Appears stable with good inflow to B LE  EmmRoxy Horseman22/2023 7:22 AM --  Laboratory Lab Results: Recent Labs    12/08/21 0637  WBC 7.3  HGB 11.6*  HCT 34.6*  PLT 277   BMET Recent Labs    12/06/21 0527  NA 140  K 3.3*  CL 105  CO2 26  GLUCOSE 96  BUN 10  CREATININE 0.74  CALCIUM 8.6*    COAG Lab Results  Component Value Date   INR 1.2 11/26/2021   INR 1.1 11/26/2021   INR 1.2 11/07/2021   PROTIME 28.8 (H) 02/04/2015   PROTIME 28.8 (H) 12/10/2014   PROTIME 36.0 (H) 08/24/2014   No results found for: PTT   VASCULAR STAFF ADDENDUM: I have independently interviewed and examined the patient. I agree with the above.  Thankfully continues to improve. Abdominal wall drainage has slowed considerably. His incision remains intact. Ok [name] [name]. No showering or submerging abdominal incision until I see him in the office. Will arrange follow up with me in next 2-3 weeks with ABI.  ThoYevonne AlineawStanford BreedD [facility] Number: [phone]/2023 9:11 AM

## 2021-12-08 NOTE — Progress Notes (Signed)
Occupational Therapy Session Note  Patient Details  Name: [name] [name] MRN: [id number] Date of Birth: 1960/11/18  Today's Date: 12/08/2021 OT Individual Time: 0800-0900 OT Individual Time Calculation (min): 60 min    Short Term Goals: Week 1:  STG=LTG's due to LOS   Skilled Therapeutic Interventions/Progress Updates:   Pt seen for skilled OT this am for training with pt and significant other [name] who was present for the entire session. Pt agreeable to sink side self care and mobility training. Rolling side to side independent, supine to sit with S. Transfer sit to stand with S and amb to and from sink side with RW with S. Toilet access with grab bars and CGA for transfer. Sink side self care with intermittent standing up to 2 minutes max for peri hygiene. UB sponge bathing and pull over shirt dressing set up only. LB sponge bathing with S and LB dressing with shorts, socks and sneakers with S. Surgeon arrived to assess pt while resting between tasks and recommended continuing with sponge bathing for now. Once at EOB, OT training for B UE red (medium) theraband 10 reps for tricep press x 3 sets. Cues for pacing and breathing. Recommending BID and updated current written HEP to reflect. [name] and pt demonstrated teach back and understanding. Handed pt off to PT for session with no issues or concerns arising.   Therapy Documentation Precautions:  Precautions Precautions: Fall Precaution Comments: wound vac d/c'd by MD earlier this am, Apply abdominal binder while out of bed. Restrictions Weight Bearing Restrictions: No Other Position/Activity Restrictions: a-line d/c'd earlier    Therapy/Group: Individual Therapy  Barnabas Lister 12/08/2021, 12:52 PM

## 2021-12-08 NOTE — Progress Notes (Addendum)
Physical Therapy Session Note  Patient Details  Name: [name] [name] MRN: [id number] Date of Birth: [birth year]  Today's Date: 12/08/2021 PT Individual Time:0900  - 1018, 78 min; 1522-1607, 45 min     Short Term Goals: Week 1:  PT Short Term Goal 1 (Week 1): STG = LTG  Skilled Therapeutic Interventions/Progress Updates:  Tx 1:  Pt resting in bed.  He denied pain but c/o RLE numbness. Pt donned abdominal binder in sitting, with set up.  Supine> sit with supervision, using bed features.  Rolling R><L with wedge under head as per home .  Sit> stand to RW with supervision.  Gait x 100' , x 46' with supervision on level tile, RW.  Advanced gait through obstacle course requiring stepping over obstacles, weaving in/out  and laterally through tight spaces, supervision.  Pt picked his RW up to move laterally through tight spaces; PT corrected pt  ;he believes he will not use his RW much in their house.  Therapeutic exercise performed with LE to increase strength for functional mobility.  Use of Kinetron in sitting on seat, resistance 30 cm/sec x 2 min.  Standing exs with bil UE support; 12 x 1 bil heel/toe raises, hamstring curls, mini squats.  With 1UE support and single limb stance, 10 x 1 each R/L hip flexion for gluteus medius activation. Weakness evident by lateral instability with movement.   Pt needed seated rest break after q set. Supine, in hooklying: 10 x 1 bil bridging.  In R /L sidelying: L/R hip abduction with flexed hips and knees.  For UE strengthening, wc propulsion x 50' with supervision.  Pt education for diaphragmatic breathing, with good carryover.   Basic transfers without AD with supervision.    At end of session, pt resting sitting up in bed; alarm set and needs at hand.  Wife trained in basic transfer and safety plan updated.  Tx 2:  Pt resting in bed.  Supine> sit with supervision.  Transfer to wc with supervision.  Abdominal binder not donned; no episodes of light  headedness this session.   Wc propulsion using bil UEs for activity tolerance, x 150' , x 2 iwht supervision.  Gait training up/down 4 steps 1 rail with close supervision., one hand on rail.  Pt self -selected step -through method ascending, and step- to method descending.  Pt reported that his railings are 4-5' wide, and after discussion he wanted to try steps with 1 hand on railing.  Gait training on level tile, RW, x 55' with supervision.  Sit>< stand throughout session without AD, supervision.  Simulated car transfer without AD per pt request, supervision and cues for safest technique.  Standing tolerance using BITS, visual scanning /complex array, x 62 sec, 61 sec.  3rd bout while standing on compliant mat, 1 min 48 sec before bil LE fatigue required him to sit quickly.  Pt 100% accurate with scanning task, x 3 trials.  At end of session, pt seated in wc with needs at hand.  PT and pt discussed safety and need to call for staff before getting back into bed; pt verbalized compliance.  PT spoke with NT to inform her.      Therapy Documentation Precautions:  Precautions Precautions: Fall Precaution Comments: wound vac d/c'd by MD earlier this am, Apply abdominal binder while out of bed. Restrictions Weight Bearing Restrictions: No Other Position/Activity Restrictions: a-line d/c'd earlier         Therapy/Group: Individual Therapy  Lovie Zarling 12/08/2021, 9:38 AM

## 2021-12-08 NOTE — Progress Notes (Signed)
Speech Language Pathology Daily Session Note  Patient Details  Name: SRICHARAN LACOMB MRN: [id number] Date of Birth: 10-20-1960  Today's Date: 12/08/2021 SLP Individual Time: 1135-1200 SLP Individual Time Calculation (min): 25 min  Short Term Goals: Week 1: SLP Short Term Goal 1 (Week 1): Pt will participate in MBSS to further assess biomechanics of swallow function with 100% completion. SLP Short Term Goal 1 - Progress (Week 1): Met SLP Short Term Goal 2 (Week 1): Pt will participate in RMST evaluation to determine need for further intervention. SLP Short Term Goal 3 (Week 1): Pt will recall at least 3 actions/activities that may be beneficial in preventing development of aspiration and/or complications from aspiration with Sup A.  Skilled Therapeutic Interventions: Pt seen this date for skilled ST intervention targeting dysphagia goals outlined above. Pt received lying in bed with eyes closed. Aroused to his name. Initially, only communicating via hand gestures; then began to communicate via verbalizations. Agreeable to ST intervention.  SLP facilitated today's session by providing the following skilled ST interventions within the context of functional tx tasks: reinforcement of aspiration precautions (oral care before PO intake, no straws, small sips, upright positioning during and at least 30 minutes after meals), importance/rationale for consistent and thorough oral care before PO intake (TID to QID), and recommendations for OP therapy targeting swallowing dysfunction (lymphatic PT/OT vs ST). Pt demonstrated understanding of oral care with suction toothbrush given Set-Up A and Min verbal A for thoroughness.   With brief PO trials of thin liquid via cup, pt continues to present with consistent + immediate throat clearing resulting in intermittent oral suctioning via [place]. Nevertheless, vocal quality clear following throat clearing. Weak cough persists. Per chart review, VSS. Given  instrumental assessment findings, pt may benefit from trial of RMST to improve airway protection and cough response. Oral care kits provided. Pt verbalized understanding of recommendations, though will continue to benefit from reinforcement of education. Pt appeared receptive to today's education.   Pt left in bed with all safety measures activated. Call bell within reach and all immediate needs met. Continue per current ST POC.   Pain No pain reported; NAD appreciated.  Therapy/Group: Individual Therapy  Sallye Lunz A Eyden Dobie 12/08/2021, 2:06 PM

## 2021-12-08 NOTE — Progress Notes (Signed)
Pt medications given this morning. Nurse waiting in room for pt to take potassium.  Pt stated "you're just going to have to wait on that on" as everyone has let him take it as he pleases. Pt took other medications with this nurse present.   Dayna Ramus

## 2021-12-08 NOTE — Progress Notes (Signed)
[place] [facility]) Hospital Liaison note:  This patient is currently enrolled in [facility] outpatient-based Palliative Care. Will continue to follow for disposition.  Please call with any outpatient palliative questions or concerns.  Thank you, Lorelee Market, LPN [facility] Liaison [phone]

## 2021-12-09 ENCOUNTER — Other Ambulatory Visit (HOSPITAL_COMMUNITY): Payer: Self-pay

## 2021-12-09 DIAGNOSIS — R5381 Other malaise: Secondary | ICD-10-CM | POA: Diagnosis not present

## 2021-12-09 MED ORDER — MAGNESIUM OXIDE 400 MG PO TABS
250.0000 mg | ORAL_TABLET | Freq: Every day | ORAL | 0 refills | Status: DC
Start: 2021-12-09 — End: 2022-04-21
  Filled 2021-12-09: qty 30, 60d supply, fill #0

## 2021-12-09 MED ORDER — PANTOPRAZOLE SODIUM 40 MG PO TBEC
40.0000 mg | DELAYED_RELEASE_TABLET | Freq: Every day | ORAL | 0 refills | Status: DC
  Filled 2021-12-09: qty 30, 30d supply, fill #0

## 2021-12-09 MED ORDER — POLYETHYLENE GLYCOL 3350 17 G PO PACK: 17.0000 g | PACK | Freq: Two times a day (BID) | ORAL | 0 refills | Status: AC

## 2021-12-09 MED ORDER — METHOCARBAMOL 500 MG PO TABS
500.0000 mg | ORAL_TABLET | Freq: Four times a day (QID) | ORAL | 0 refills | Status: DC | PRN
Start: 2021-12-09 — End: 2022-03-27
  Filled 2021-12-09: qty 60, 15d supply, fill #0

## 2021-12-09 MED ORDER — METOPROLOL SUCCINATE ER 25 MG PO TB24
25.0000 mg | ORAL_TABLET | Freq: Every day | ORAL | 0 refills | Status: DC
  Filled 2021-12-09: qty 30, 30d supply, fill #0

## 2021-12-09 MED ORDER — RIVAROXABAN 20 MG PO TABS
20.0000 mg | ORAL_TABLET | Freq: Every day | ORAL | 0 refills | Status: DC
  Filled 2021-12-09: qty 30, 30d supply, fill #0

## 2021-12-09 MED ORDER — ATORVASTATIN CALCIUM 20 MG PO TABS
20.0000 mg | ORAL_TABLET | Freq: Every day | ORAL | 0 refills | Status: DC
  Filled 2021-12-09: qty 30, 30d supply, fill #0

## 2021-12-09 MED ORDER — ACETAMINOPHEN 325 MG PO TABS: 650.0000 mg | ORAL_TABLET | Freq: Four times a day (QID) | ORAL | Status: AC

## 2021-12-09 MED ORDER — OXYCODONE HCL 5 MG PO TABS
5.0000 mg | ORAL_TABLET | ORAL | 0 refills | Status: DC | PRN
  Filled 2021-12-09: qty 30, 4d supply, fill #0

## 2021-12-09 MED ORDER — LEVOTHYROXINE SODIUM 25 MCG PO TABS
25.0000 ug | ORAL_TABLET | Freq: Every day | ORAL | 0 refills | Status: DC
  Filled 2021-12-09: qty 30, 30d supply, fill #0

## 2021-12-09 MED ORDER — POTASSIUM CHLORIDE CRYS ER 20 MEQ PO TBCR
20.0000 meq | EXTENDED_RELEASE_TABLET | Freq: Every day | ORAL | 0 refills | Status: DC
  Filled 2021-12-09: qty 30, 30d supply, fill #0

## 2021-12-09 NOTE — Progress Notes (Signed)
Inpatient Rehabilitation Discharge Medication Review by a Pharmacist  A complete drug regimen review was completed for this patient to identify any potential clinically significant medication issues.  High Risk Drug Classes Is patient taking? Indication by Medication  Antipsychotic No   Anticoagulant Yes Xarelto- Hx DVT  Antibiotic No   Opioid Yes OxyIR- acute pain  Antiplatelet Yes Aspirin- CVA prophylaxis  Hypoglycemics/insulin No   Vasoactive Medication Yes Toprol- rate control  Chemotherapy No   Other Yes Lipitor- HLD Synthroid- hypothyroidism Protonix- GERD     Type of Medication Issue Identified Description of Issue Recommendation(s)  Drug Interaction(s) (clinically significant)     Duplicate Therapy     Allergy     No Medication Administration End Date     Incorrect Dose     Additional Drug Therapy Needed     Significant med changes from prior encounter (inform family/care partners about these prior to discharge).    Other       Clinically significant medication issues were identified that warrant physician communication and completion of prescribed/recommended actions by midnight of the next day:  No  Time spent performing this drug regimen review (minutes):  30   Denica Web BS, PharmD, BCPS Clinical Pharmacist 12/09/2021 3:01 PM  Contact: [phone] after 3 PM  "Be curious, not judgmental..." -Jamal Maes

## 2021-12-09 NOTE — Discharge Instructions (Addendum)
Inpatient Rehab Discharge Instructions  [name] [name] Discharge date and time: 12/09/21   Activities/Precautions/ Functional Status: Activity: activity as tolerated Diet:  Liquid--NO STRAWS.  Wound Care: Routine skin checks   Functional status:  ___ No restrictions     ___ Walk up steps independently _X__ 24/7 supervision/assistance   ___ Walk up steps with assistance ___ Intermittent supervision/assistance  ___ Bathe/dress independently ___ Walk with walker     __x_ Bathe/dress with assistance ___ Walk Independently    ___ Shower independently ___ Walk with assistance    ___ Shower with assistance _X__ No alcohol     ___ Return to work/school ________  Special Instructions: No driving smoking alcohol or illicit drug use    COMMUNITY REFERRALS UPON DISCHARGE:    Outpatient: PT & SP             Agency:[facility]  [address] [address] Phone:[phone]              Appointment Date/Time:WILL CONTACT PATIENT REGARDING FOLLOW UP APPOINTMENTS  Medical Equipment/Items Ordered:CANE AND 3 IN 1                                                 Agency/Supplier:[facility]  [phone]  OUTPATIENT PALLIATIVE WILL CONTINUE TO FOLLOW DID FOLLOW PRIOR TO ADMISSION    My questions have been answered and I understand these instructions. I will adhere to these goals and the provided educational materials after my discharge from the hospital.  Patient/Caregiver Signature _______________________________ Date __________  Clinician Signature _______________________________________ Date __________  Please bring this form and your medication list with you to all your follow-up doctor's appointments.

## 2021-12-09 NOTE — Progress Notes (Signed)
Occupational Therapy Discharge Summary  Patient Details  Name: [name] [name] MRN: [id number] Date of Birth: 15-Dec-1960  Today's Date: 12/09/2021 OT Individual Time: 1405-1500 OT Individual Time Calculation (min): 55 min    Patient has met 10 of 10 long term goals due to improved activity tolerance, improved balance, postural control, ability to compensate for deficits, and improved awareness.  Patient to discharge at overall Supervision level.  Patient's care partner is independent to provide the necessary physical assistance at discharge.    Reasons goals not met: n/a  Recommendation:  Patient will benefit from ongoing skilled OT services in home health setting or outpatient setting if needed to continue to advance functional skills in the area of BADL, iADL, and Reduce care partner burden.  Equipment: No equipment provided  Reasons for discharge: treatment goals met  Patient/family agrees with progress made and goals achieved: Yes  OT Discharge Precautions/Restrictions  Precautions Precautions: Fall;Other (comment) Precaution Comments: abdominal binder OOB Required Braces or Orthoses: Other Brace Other Brace: abdominal binder Restrictions Weight Bearing Restrictions: No General Chart Reviewed: Yes Vital Signs Therapy Vitals Temp: 98.2 F (36.8 C) Pulse Rate: (!) 107 Resp: 16 BP: 138/71 Patient Position (if appropriate): Lying Oxygen Therapy SpO2: 95 % O2 Device: Room Air Pain Pain Assessment Pain Scale: 0-10 Pain Score: 1  Pain Type: Surgical pain Pain Location: Foot Pain Orientation: Right Pain Descriptors / Indicators: Dull Pain Onset: Gradual Patients Stated Pain Goal: 0 Pain Intervention(s): Pain med given for lower pain score than stated, per patient request;Rest;Distraction Multiple Pain Sites: No ADL ADL Eating: Independent Where Assessed-Eating: Bed level, Wheelchair, Edge of bed Grooming: Independent Where Assessed-Grooming: Sitting at sink,  Standing at sink Upper Body Bathing: Independent Where Assessed-Upper Body Bathing: Sitting at sink Lower Body Bathing: Modified independent Where Assessed-Lower Body Bathing: Sitting at sink, Standing at sink Upper Body Dressing: Independent Where Assessed-Upper Body Dressing: Sitting at sink Lower Body Dressing: Modified independent Where Assessed-Lower Body Dressing: Sitting at sink, Standing at sink Toileting: Independent Where Assessed-Toileting: Glass blower/designer: Close supervision Toilet Transfer Method: Stand pivot, Counselling psychologist: Energy manager: Close supervision Social research officer, government Method: Stand pivot, Print production planner with back ADL Comments: Sponge bathing for now due to abdominal incisions as per surgeon Vision Baseline Vision/History: 1 Wears glasses Patient Visual Report: No change from baseline Vision Assessment?: No apparent visual deficits Perception  Perception: Within Functional Limits Praxis Praxis: Intact Cognition Cognition Overall Cognitive Status: Within Functional Limits for tasks assessed Arousal/Alertness: Awake/alert Orientation Level: Person;Place;Situation Person: Oriented Place: Oriented Situation: Oriented Memory: Appears intact Awareness: Impaired Awareness Impairment: Intellectual impairment;Emergent impairment Problem Solving: Appears intact Safety/Judgment: Impaired Comments: for higher level decision making Brief Interview for Mental Status (BIMS) Repetition of Three Words (First Attempt): 3 Temporal Orientation: Year: Correct Temporal Orientation: Month: Accurate within 5 days Temporal Orientation: Day: Correct Recall: "Sock": Yes, no cue required Recall: "Blue": Yes, no cue required Recall: "Bed": Yes, no cue required BIMS Summary Score: 15 Sensation Sensation Light Touch: Appears Intact Hot/Cold: Appears Intact Proprioception: Appears  Intact Stereognosis: Appears Intact Additional Comments: premorbid RLE sensation deficits, distal to proximal Coordination Gross Motor Movements are Fluid and Coordinated: No Fine Motor Movements are Fluid and Coordinated: Yes Coordination and Movement Description: limited by abdominal discomfort and RLE pain with mobility Finger Nose Finger Test: intact Motor  Motor Motor: Other (comment) Motor - Skilled Clinical Observations: Generalized weakness and deconditioning Motor - Discharge Observations: Improved with overall motor skills this  session Mobility  Bed Mobility Bed Mobility: Sit to Supine;Supine to Sit Rolling Right: Independent with assistive device Supine to Sit: Independent with assistive device Sit to Supine: Independent with assistive device Transfers Sit to Stand: Supervision/Verbal cueing Stand to Sit: Supervision/Verbal cueing  Trunk/Postural Assessment  Cervical Assessment Cervical Assessment: Exceptions to [facility] Cervical AROM Overall Cervical AROM Comments: due to hx of tonsillar mass excision Thoracic Assessment Thoracic Assessment: Within Functional Limits Lumbar Assessment Lumbar Assessment: Exceptions to [facility] Lumbar AROM Overall Lumbar AROM Comments: posterior pelvic sitting and stiffness Postural Control Postural Control: Within Functional Limits  Balance Balance Balance Assessed: Yes Static Sitting Balance Static Sitting - Balance Support: No upper extremity supported;Feet supported Static Sitting - Level of Assistance: 6: Modified independent (Device/Increase time) Dynamic Sitting Balance Dynamic Sitting - Balance Support: Feet supported;During functional activity Dynamic Sitting - Level of Assistance: 6: Modified independent (Device/Increase time) Static Standing Balance Static Standing - Balance Support: Left upper extremity supported Static Standing - Level of Assistance: 6: Modified independent (Device/Increase time) Dynamic Standing  Balance Dynamic Standing - Balance Support: No upper extremity supported;Left upper extremity supported Dynamic Standing - Level of Assistance: 5: Stand by assistance Extremity/Trunk Assessment RUE Assessment RUE Assessment: Within Functional Limits LUE Assessment LUE Assessment: Within Functional Limits  Pt seen for final session of skilled OT with focus on caregiver training and education with significant other [name] and patient in preparation for discharge home tomorrow as per Care Team Conference outcome. Pt and [name] eager for completion of discharge education. Pt transferred supine to sit from bed with mod I with rails and performed donning tennis shoes and abdominal binder indep.  Amb with SPC 15 ft to w/c with S. Pt self propelled with LE's and Ue's ~ 150 ft to and from therapy gym for car and demo apartment for sofa transfer training. Pt required S for both transfers with pt able to teach back safety techniques and scooting to edge of sitting surface with forward weight shift for sit to stand. Pt required only brief rests between all tasks and reported no pain throughout session. Once back to room, OT reinforced light B UE theraband for tricep press and shared link for B UE arm cycle for [name] to purchase if interested for HEP use. Pt remained up in w/c at end of session and caregiver present, call bell and needs within reach. No further skilled OT services needed as inpatient and discharge for CIR OT completed.   Barnabas Lister 12/09/2021, 3:28 PM

## 2021-12-09 NOTE — Progress Notes (Signed)
Patient ID: [name] [name], male   DOB: [birth year], 61 y.o.   MRN: [id number]  Met with pt and daughter was present in his room to update regarding team conference goals of supervision-mod/I and pt's request to discharge tomorrow. MD and therapy team feel this is feasible and have set his discharge for this date. Discussed OP recommendation by team and he will let this worker send the referral to [facility] and then decide if he plans on going. He will allow worker to order a cane and bedside commode unsure if will use either of them. He is happy to be able to go home and feels he is ready and does not feel by staying it would provide him any benefit. He and daughter to discuss if going to daughter's home for one week then back to his home. See in am to make sure equipment has been delivered.

## 2021-12-09 NOTE — Patient Care Conference (Signed)
Inpatient RehabilitationTeam Conference and Plan of Care Update Date: 12/09/2021   Time: 11:37 AM    Patient Name: [name] [name]      Medical Record Number: [id number]  Date of Birth: 1960-08-21 Sex: Male         Room/Bed: 4W10C/4W10C-01 Payor Info: Payor: [place] / Plan: [place] MEDICAID HEALTHY BLUE / Product Type: *No Product type* /    Admit Date/Time:  12/03/2021  5:05 PM  Primary Diagnosis:  [facility] Problems: Principal Problem:   Debility Active Problems:   ABLA (acute blood loss anemia)   Atrial fibrillation ([place])   Marijuana abuse   Ischemia of extremity    Expected Discharge Date: Expected Discharge Date: 12/10/21  Team Members Present: Physician leading conference: Dr. Courtney Heys Social Worker Present: Ovidio Kin, LCSW Nurse Present: Dorthula Nettles, RN PT Present: Ailene Rud, PT OT Present: Jamey Ripa, OT SLP Present: Helaine Chess, SLP PPS Coordinator present : Gunnar Fusi, SLP     Current Status/Progress Goal Weekly Team Focus  Bowel/Bladder   continent b/b  remain continent  toilet as needed   Swallow/Nutrition/ Hydration   MBSS completed on 12/05/2021 with recommendations for thin liquids via cup only with strict adherence to aspiration precautions.  Sup A  Reinforcement of aspiration precautions and diet tolerance monitoring   ADL's   Set up sink side UB and LB bathing and simple dressing, set up mouth and hair care, surgeon said no showers for now, toilet transfers with RW with close S  mod I  ADL and functional mobility training, balance and activity tolerance progression, simple UE HEP for generalized strengthening   Mobility   bed mobility with supervision, supervisions transfers and gait up to 100'. car transfers supervision, steps close supervision  mod I overall  endurance, gait, AD use/safety   Communication   Speech intelligibility is functional though negatively impacted by diminished mandibular ROM and  strength. Recommend outpatient therapy to address.  N/A  N/A   Safety/Cognition/ Behavioral Observations  Appears to be at cognitive baseline  N/A  N/A   Pain   no reports of pain  remain pain free  assess pain q 4hr and prn   Skin   incisions CDI  no new breakdown  assess skin q shift and prn     Discharge Planning:  HOme with girlfriend who can assist but does work and pt will be alone at times   Team Discussion: Refusing medications. Non-compliant with swallowing precautions. Continent B/B, no reports of pain. Incisions clean and dry. Discharging home due to poor compliance.  Patient on target to meet rehab goals: Supervision goals. Supervision with cane but reports that he will not use it, will furniture walk instead.   *See Care Plan and progress notes for long and short-term goals.   Revisions to Treatment Plan:  Finalizing discharge plans.   Teaching Needs: Family education, medication management, transfer/gait training, etc.   Current Barriers to Discharge: Decreased caregiver support, Home enviroment access/layout, Wound care, Lack of/limited family support, Medication compliance, and Behavior  Possible Resolutions to Barriers: Family education Patient comply with recommendations     Medical Summary Current Status: wants to leave tomorrow refuses to follow SLP precautions;  Barriers to Discharge: Weight;Nutrition means;Wound care;Decreased family/caregiver support;Home enviroment access/layout;Medication compliance  Barriers to Discharge Comments: switched to cane- refuses any AD; Possible Resolutions to [employer]: d/c Wednesday since pt won't follow any precautions- pt insistent   Continued Need for Acute Rehabilitation Level  of Care: The patient requires daily medical management by a physician with specialized training in physical medicine and rehabilitation for the following reasons: Direction of a multidisciplinary physical rehabilitation program to  maximize functional independence : Yes Medical management of patient stability for increased activity during participation in an intensive rehabilitation regime.: Yes Analysis of laboratory values and/or radiology reports with any subsequent need for medication adjustment and/or medical intervention. : Yes   I attest that I was present, lead the team conference, and concur with the assessment and plan of the team.   Cristi Loron 12/09/2021, 5:10 PM

## 2021-12-09 NOTE — Progress Notes (Signed)
Physical Therapy Discharge Summary  Patient Details  Name: [name] [name] MRN: [id number] Date of Birth: 1960/11/28     Patient has met 7 of 8 long term goals due to improved balance, improved postural control, increased strength, and ability to compensate for deficits.  Patient to discharge at an ambulatory level Supervision.   Patient's care partner is independent to provide the necessary physical assistance at discharge. Pt to d/c home with his significant other who has underwent family education. Pt able to ambulate up to 200 ft with RW or SPC. Pt is adamant that he will not use AD at home, despite therapist's recommendation of SPC for balance, decreased work of walking, safety, and improved gait pattern. Pt performs stairs with supervision and 1 hand rail.   Reasons goals not met: Pt to d/c earlier than expected d/t refusal of medication, refusal of education, and pt's wishes. Pt able to perform 4 stairs but did not progress to 12.   Recommendation:  Patient will benefit from ongoing skilled PT services in home health setting to continue to advance safe functional mobility, address ongoing impairments in balance, gait impairments, strength, endurance, and minimize fall risk.  Equipment: No equipment provided  Reasons for discharge: treatment goals met and discharge from hospital  Patient/family agrees with progress made and goals achieved: Yes  PT Discharge Precautions/Restrictions Precautions Precautions: Fall Precaution Comments: abdominal binder OOB Restrictions Weight Bearing Restrictions: No Vital Signs   Pain Pain Assessment Pain Scale: 0-10 Pain Score: 0-No pain Pain Interference Pain Interference Pain Effect on Sleep: 3. Frequently Pain Interference with Therapy Activities: 3. Frequently Pain Interference with Day-to-Day Activities: 3. Frequently Vision/Perception  Vision - History Ability to See in Adequate Light: 0 Adequate Perception Perception: Within  Functional Limits Praxis Praxis: Intact  Cognition Overall Cognitive Status: Within Functional Limits for tasks assessed Arousal/Alertness: Awake/alert Orientation Level: Oriented X4 Year: 2023 Month: May Day of Week: Correct Memory: Appears intact Awareness: Appears intact Problem Solving: Appears intact Safety/Judgment: Impaired (pt with poor insight and states he will not use reccommended AD at home) Sensation Sensation Light Touch: Appears Intact Hot/Cold: Appears Intact Proprioception: Appears Intact Stereognosis: Appears Intact Additional Comments: premorbid RLE sensation deficits, distal to proximal Coordination Gross Motor Movements are Fluid and Coordinated: No Fine Motor Movements are Fluid and Coordinated: Yes Coordination and Movement Description: limited by abdominal discomfort and RLE pain with mobility Motor  Motor Motor: Other (comment) Motor - Skilled Clinical Observations: Generalized weakness and deconditioning  Mobility Bed Mobility Bed Mobility: Sit to Supine;Supine to Sit Rolling Right: Independent with assistive device Supine to Sit: Independent with assistive device Sit to Supine: Independent with assistive device Transfers Transfers: Sit to Stand;Stand to Sit;Stand Pivot Transfers Sit to Stand: Supervision/Verbal cueing Stand to Sit: Supervision/Verbal cueing Stand Pivot Transfers: Supervision/Verbal cueing Stand Pivot Transfer Details: Verbal cues for safe use of DME/AE;Verbal cues for precautions/safety Transfer (Assistive device): Straight cane Locomotion  Gait Ambulation: Yes Gait Assistance: Supervision/Verbal cueing Gait Distance (Feet): 200 Feet Assistive device: Straight cane Gait Assistance Details: Verbal cues for safe use of DME/AE Gait Gait: Yes Gait Pattern: Impaired Gait Pattern: Step-through pattern;Trunk flexed;Decreased stride length;Right foot flat;Left foot flat Gait velocity: decreased Stairs / Additional  Locomotion Stairs: Yes Stairs Assistance: Supervision/Verbal cueing Stair Management Technique: One rail Right Number of Stairs: 4 Height of Stairs: 6 Wheelchair Mobility Wheelchair Mobility: No  Trunk/Postural Assessment  Cervical Assessment Cervical Assessment: Exceptions to [facility] Cervical AROM Overall Cervical AROM Comments: due to hx of tonsillar mass excision  Thoracic Assessment Thoracic Assessment: Within Functional Limits Lumbar Assessment Lumbar Assessment: Exceptions to [facility] Lumbar AROM Overall Lumbar AROM Comments: posterior pelvic sitting and stiffness Postural Control Postural Control: Within Functional Limits  Balance Balance Balance Assessed: Yes Static Sitting Balance Static Sitting - Balance Support: No upper extremity supported;Feet supported Static Sitting - Level of Assistance: 6: Modified independent (Device/Increase time) Dynamic Sitting Balance Dynamic Sitting - Balance Support: Feet supported;During functional activity Dynamic Sitting - Level of Assistance: 6: Modified independent (Device/Increase time) Static Standing Balance Static Standing - Balance Support: Left upper extremity supported Static Standing - Level of Assistance: 6: Modified independent (Device/Increase time) Dynamic Standing Balance Dynamic Standing - Balance Support: No upper extremity supported;Left upper extremity supported Dynamic Standing - Level of Assistance: 5: Stand by assistance Extremity Assessment      RLE Assessment RLE Assessment: Exceptions to [facility] General Strength Comments: Grossly 4/5 LLE Assessment LLE Assessment: Exceptions to [facility] General Strength Comments: Grossly 4+/5    Lulie Hurd C [place] Kuang 12/09/2021, 11:57 AM

## 2021-12-09 NOTE — Plan of Care (Signed)
  Problem: RH Balance Goal: LTG Patient will maintain dynamic standing with ADLs (OT) Description: LTG:  Patient will maintain dynamic standing balance with assist during activities of daily living (OT)  Outcome: Completed/Met   Problem: Sit to Stand Goal: LTG:  Patient will perform sit to stand in prep for activites of daily living with assistance level (OT) Description: LTG:  Patient will perform sit to stand in prep for activites of daily living with assistance level (OT) Outcome: Completed/Met   Problem: RH Grooming Goal: LTG Patient will perform grooming w/assist,cues/equip (OT) Description: LTG: Patient will perform grooming with assist, with/without cues using equipment (OT) Outcome: Completed/Met   Problem: RH Bathing Goal: LTG Patient will bathe all body parts with assist levels (OT) Description: LTG: Patient will bathe all body parts with assist levels (OT) Outcome: Completed/Met   Problem: RH Dressing Goal: LTG Patient will perform lower body dressing w/assist (OT) Description: LTG: Patient will perform lower body dressing with assist, with/without cues in positioning using equipment (OT) Outcome: Completed/Met   Problem: RH Toileting Goal: LTG Patient will perform toileting task (3/3 steps) with assistance level (OT) Description: LTG: Patient will perform toileting task (3/3 steps) with assistance level (OT)  Outcome: Completed/Met   Problem: RH Simple Meal Prep Goal: LTG Patient will perform simple meal prep w/assist (OT) Description: LTG: Patient will perform simple meal prep with assistance, with/without cues (OT). Outcome: Completed/Met   Problem: RH Toilet Transfers Goal: LTG Patient will perform toilet transfers w/assist (OT) Description: LTG: Patient will perform toilet transfers with assist, with/without cues using equipment (OT) Outcome: Completed/Met   Problem: RH Tub/Shower Transfers Goal: LTG Patient will perform tub/shower transfers w/assist  (OT) Description: LTG: Patient will perform tub/shower transfers with assist, with/without cues using equipment (OT) Outcome: Completed/Met

## 2021-12-09 NOTE — Progress Notes (Signed)
Physical Therapy Session Note  Patient Details  Name: [name] [name] MRN: [id number] Date of Birth: 07/10/1961  Today's Date: 12/09/2021 PT Individual Time: 0915-1015 PT Individual Time Calculation (min): 60 min   Short Term Goals: Week 1:  PT Short Term Goal 1 (Week 1): STG = LTG  Skilled Therapeutic Interventions/Progress Updates:    pt received in bed and agreeable to therapy. No complaint of pain. Pt using suction and clearing into paper towel for oral secretions throughout session. Bed mobility mod I with bed features. Session focused on gait trials with RW, cane, and no AD. Pt ambulated x 120 ft and 5 x ~170 ft. Trials with RW, no AD, and SPC. Pt with wide BOS, hip external rotation, steppage type gait pattern with poor eccentric DF control on RLE. Noted gait impairments worsen with decreased AD support and with fatigue. Pt also performed 2 laps of obstacle course with cones and tight turns with SPC and no AD. Without cane, pt kicked cones, required larger BOS and demoed poor fluidity of gait. Even when pointed out discrepancy, pt maintains that he will not use AD at home. Pt continues to be resistant to education. Pt returned to room and remained in w/c with all needs in reach.   Therapy Documentation Precautions:  Precautions Precautions: Fall Precaution Comments: wound vac d/c'd by MD earlier this am, Apply abdominal binder while out of bed. Restrictions Weight Bearing Restrictions: No Other Position/Activity Restrictions: a-line d/c'd earlier General:       Therapy/Group: Individual Therapy  Mickel Fuchs 12/09/2021, 10:12 AM

## 2021-12-09 NOTE — Plan of Care (Signed)
  Problem: RH Swallowing Goal: LTG Patient will consume least restrictive diet using compensatory strategies with assistance (SLP) Description: LTG:  Patient will consume least restrictive diet using compensatory strategies with assistance (SLP) Outcome: Completed/Met Flowsheets (Taken 12/04/2021 1823) LTG: Pt Patient will consume least restrictive diet using compensatory strategies with assistance of (SLP): Supervision Goal: LTG Patient will participate in dysphagia therapy to increase swallow function with assistance (SLP) Description: LTG:  Patient will participate in dysphagia therapy to increase swallow function with assistance (SLP) Outcome: Completed/Met Flowsheets (Taken 12/04/2021 1823) LTG: Pt will participate in dysphagia therapy to increase swallow function with assistance of (SLP): Supervision

## 2021-12-09 NOTE — Progress Notes (Signed)
Pt refused multiple of the HS medications. Pt was educated on the importance of following the medication regime. Pt continues to refuse. Provider will be made aware.

## 2021-12-09 NOTE — Progress Notes (Addendum)
Speech Language Pathology Discharge Summary  Patient Details  Name: [name] [name] MRN: [id number] Date of Birth: 07/05/1961  Today's Date: 12/09/2021 SLP Individual Time: 1102-1130 SLP Individual Time Calculation (min): 28 min   Skilled Therapeutic Interventions:    Pt seen this date for skilled ST intervention targeting dysphagia goals outlined in care plan. Pt received awake/alert and sitting upright in bed. Supportive daughter at bedside. Agreeable to ST intervention with encouragement.  SLP facilitated today's session by providing the following skilled ST interventions within the context of functional tx tasks: ongoing pt and family education re: aspiration precautions (no straws, throat clear after PO intake, reinforcement of oral care TID to QID, diet recommendations, and MBSS findings, as well as recommendations for OP swallow tx and f/u with ENT. Discussed importance of utilizing nutritional supplementation for wound healing and general nutrition given no solid food was recommended following MBSS. SLP provided Ensure Plus, which was ordered for him. Pt took two sips and then sat it down and stated he would drink the rest of it later. Continues to present with consistent throat clearing and suctioning after all PO intake; dry vocal quality after performing throat clear. Attempted to d/w pt and pt's daughter wishes re: potential alternate means of nutrition and hydration given limited PO intake secondary to oropharyngeal dysphagia; however, pt declined discussion despite daughter's interest. He states he will eat and drink what he wants once he is discharged despite risks. Not receptive to education provided. Daughter appreciative of assistance and SLP provided pt's daughter with OP resources.   Pt left in bed with all safety measures activated. Call bell within reach and all immediate needs met. Daughter remained at bedside. Pt to be discharged from ST intervention this date; please see below  for details.   Patient has met 2 of 2 long term goals.  Patient to discharge at overall Supervision level.  Reasons goals not met: N/A   Clinical Impression/Discharge Summary:  At time of CIR discharge, pt achieved 2 out of 2 long-term goals, set at time of initial evaluation on 12/04/2021, and is discharging with Sup A for implementation of aspiration precautions, completion of oral care via suction toothbrush, and adherence to diet recommendations s/p MBSS on 12/05/2021 (thin liquids via cup only + use of nutritional supplementation to promote increased calories and nutrients). 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  SLP attempted to discuss aspiration precautions and alternate means of nutrition/hydration (e.g. PEG) with pt and pt's daughter; however, pt declined discussion, stating that he will eat and drink what he wishes, once he discharges, despite the risks this may pose to his long-term health. His daughter, on the other hand, was interested in discussion. Per d/w interdisciplinary team, pt followed by palliative care in the community. Pt's participation and adherence to recommendations further compounded by poor insight vs ?denial of deficits and apathy. It should be noted that from a cognitive standpoint, pt appears to be presenting at his baseline function, though suspect deficits were present prior to admission. Nevertheless, cognition was not formally evaluated nor addressed during this admission.  At this time, recommend f/u with ENT and OP ST or PT for lymphedema evaluation s/p XRT ~2 years agoa and swallowing therapy. Once sternal precautions are cleared, pt would be a good candidate for EMST, if  he agrees with participation in tx. Additionally, he  would benefit from establishing care with a dentist, once mandibular ROM improves. For iADL completion, recommend intermittent supervision and assistance.   Care Partner:  Caregiver Able to Provide Assistance: Yes  Type of Caregiver Assistance: Physical;Cognitive  Recommendation:  Outpatient SLP;Home Health SLP;Other (comment) (intermittent supervision and assistance)  Rationale for SLP Follow Up: Reduce caregiver burden;Maximize swallowing safety   Equipment: N/A   Reasons for discharge: Discharged from hospital (due to non-compliance)   Patient/Family Agrees with Progress Made and Goals Achieved: Yes    Jesica Goheen A Jimesha Rising 12/09/2021, 1:36 PM

## 2021-12-09 NOTE — Progress Notes (Signed)
Occupational Therapy Session Note  Patient Details  Name: KARLIS CREGG MRN: [id number] Date of Birth: January 22, 1961  Today's Date: 12/09/2021 OT Individual Time: 0800-0900 OT Individual Time Calculation (min): 60 min    Short Term Goals: Week 1:  OT Short Term Goal 1 (Week 1): STG+LTG's due to LOS  Skilled Therapeutic Interventions/Progress Updates:  Pt seen for skilled OT session this am. Pt asked OT if he was able to be discharged tomorrow (Wednesday). OT responded that Care Conference with full team is later this am and OT would relay his request for feasibility and team's collective input including discussion with SN, PT, ST and MD. Pt eager to get up for self care routine and toileting bathroom toilet level. Pt was supine with HOB ~50 degrees and was able to perform rolling side to side mod I with rail and supine to sit with mod I. Transfer sit to stand with S and amb to and from bathroom to toilet then to sink side with RW with S. Toilet access with grab bars and S for transfer. Sink side self care with intermittent standing up to 4 minutes max for peri hygiene. UB sponge bathing and pull over shirt dressing I level, LB sponge bathing with mod I and LB dressing with shorts, socks and sneakers with mod I. Grooming mod I seated, S standing. Pt was then open to self propelling w/c to therapy gym 50 ft x 2 to access NuStep. S for transfer off and on and tolerated 4 sets of 4 minutes at L1 with rest breaks in between. No pain reported. Once back in room, pt transferred back to bed with S and left with bed alarm, call bell and needs within reach.    Therapy Documentation Precautions:  Precautions Precautions: Fall Precaution Comments: wound vac d/c'd by MD earlier this am, Apply abdominal binder while out of bed. Restrictions Weight Bearing Restrictions: No Other Position/Activity Restrictions: a-line d/c'd earlier   Therapy/Group: Individual Therapy  Barnabas Lister 12/09/2021, 8:58 AM

## 2021-12-09 NOTE — Discharge Summary (Signed)
Physician Discharge Summary  Patient ID: [name] [name] MRN: [id number] DOB/AGE: 62-11-1960 62 y.o.  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 Chronic RIGHT hilar prominence. Emphysematous changes with new small LEFT pleural effusion and mild LEFT basilar atelectasis versus infiltrate. Additionally, linear high attenuation is seen vertically in the lateral aspect of the LEFT mid lung, question atelectasis, new pleural calcification, less likely superimposed artifact. No definite pneumothorax. Mild scarring RIGHT base. Bones demineralized. IMPRESSION: New small LEFT pleural effusion and LEFT basilar atelectasis versus infiltrate. Enlargement of RIGHT pulmonary hilum increased since previous exam, cannot exclude mass or adenopathy; CT chest with contrast recommended to evaluate. Electronically Signed   By: Lavonia Dana M.D.   On: 11/12/2021 09:44   DG Chest 2 View  Result Date: 12/03/2021 CLINICAL DATA:  Cough EXAM: CHEST - 2 VIEW COMPARISON:  11/27/2021 FINDINGS: Cardiac enlargement. Negative for heart failure. Left lower lobe airspace disease unchanged. Right lung clear.  No significant pleural effusion. IMPRESSION: No interval change. Left lower lobe atelectasis or infiltrate unchanged. Electronically Signed   By: Franchot Gallo  M.D.   On: 12/03/2021 18:52   DG Abd 1 View  Result Date: 11/10/2021 CLINICAL DATA:  Abdominal distension EXAM: ABDOMEN - 1 VIEW COMPARISON:  None FINDINGS: Gas-filled loops of large and small bowel without evidence obstruction. Minimal stool burden. Gas in the stomach. IMPRESSION: No evidence of bowel obstruction. Gas-filled loops of large and small bowel. Electronically Signed   By: Suzy Bouchard M.D.   On: 11/10/2021 15:38   CT Angio Chest Pulmonary Embolism (PE) W or WO Contrast  Result Date: 11/12/2021 CLINICAL DATA:  Pulmonary embolism (PE) suspected, high prob EXAM: CT ANGIOGRAPHY CHEST WITH CONTRAST TECHNIQUE: Multidetector CT imaging of the chest was performed using the standard protocol during bolus administration of intravenous contrast. Multiplanar CT image reconstructions and MIPs were obtained to evaluate the vascular anatomy. RADIATION DOSE REDUCTION: This exam was performed according to the departmental dose-optimization program which includes automated exposure control, adjustment of the mA and/or kV according to patient size and/or use of iterative reconstruction technique. CONTRAST:  75m OMNIPAQUE IOHEXOL 350 MG/ML SOLN COMPARISON:  09/11/2019 FINDINGS: Cardiovascular: No filling defects in the pulmonary arteries to suggest pulmonary emboli. Cardiomegaly. Aneurysmal dilatation of the ascending thoracic aorta, 4.4 cm. This compares to 4.2 cm previously. Scattered aortic calcifications. Mediastinum/Nodes: Mildly enlarged mediastinal lymph nodes. AP window lymph node has a short axis diameter of 14 mm. Borderline prevascular lymph nodes with index node having a short axis diameter of 9 mm. Subcarinal lymph node has a short axis diameter of 8 mm. Trachea and esophagus are unremarkable. Thyroid unremarkable. Lungs/Pleura: Emphysema. Trace bilateral pleural effusions. Airspace disease in the lingula and left lower lobe concerning for pneumonia. Right basilar airspace disease could reflect  atelectasis or pneumonia. Patchy ground-glass opacities in the right upper lobe also concerning for pneumonia. Upper Abdomen: Imaging into the upper abdomen  demonstrates no acute findings. Musculoskeletal: Chest wall soft tissues are unremarkable. No acute bony abnormality. Review of the MIP images confirms the above findings. IMPRESSION: No evidence of pulmonary embolus. Cardiomegaly. Mildly enlarged mediastinal lymph nodes, favor reactive. Areas of bilateral airspace disease, most confluent in the lingula and left lower lobe. Findings concerning for pneumonia. Trace bilateral pleural effusions. Electronically Signed   By: Rolm Baptise M.D.   On: 11/12/2021 21:08   CT ABDOMEN PELVIS W CONTRAST  Result Date: 11/11/2021 CLINICAL DATA:  Left lower quadrant pain EXAM: CT ABDOMEN AND PELVIS WITH CONTRAST TECHNIQUE: Multidetector CT imaging of the abdomen and pelvis was performed using the standard protocol following bolus administration of intravenous contrast. RADIATION DOSE REDUCTION: This exam was performed according to the departmental dose-optimization program which includes automated exposure control, adjustment of the mA and/or kV according to patient size and/or use of iterative reconstruction technique. CONTRAST:  162m OMNIPAQUE IOHEXOL 350 MG/ML SOLN COMPARISON:  CTA abdomen and pelvis with runoff dated November 07, 2021 FINDINGS: Lower chest: Normal heart size. Trace pericardial effusion. Bibasilar atelectasis. Hepatobiliary: No suspicious liver lesions. Sludge in stones seen in the gallbladder. No gallbladder wall thickening. No biliary ductal dilation. Pancreas: Unremarkable. No pancreatic ductal dilatation or surrounding inflammatory changes. Spleen: Normal in size without focal abnormality. Adrenals/Urinary Tract: Bilateral adrenal glands are unremarkable. No hydronephrosis. Non opacification of a portion of the lower pole and midportion of the left kidney. Low-attenuation lesions of the right kidney,  compatible with simple cysts, no follow-up imaging recommended for these lesions. Stomach/Bowel: Stomach is within normal limits. Appendix appears normal. No evidence of bowel wall thickening, distention, or inflammatory changes. 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 Complete occlusion of the right external iliac artery and proximal superficial femoral artery. Reproductive: Mild prostatomegaly. Other: Small fat containing left inguinal hernia. Small fat containing umbilical hernia. No abdominopelvic ascites. Musculoskeletal: No acute or significant osseous findings. IMPRESSION: 1. Non opacification of a portion of the lower pole and midportion of the left kidney, compatible with renal infarct, findings are new when compared with prior CTA. 2. Complete occlusion of the right external iliac artery and proximal superficial femoral artery, findings were seen on prior CTA. 3. 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 4. Aortic Atherosclerosis (ICD10-I70.0). Electronically Signed   By: LYetta GlassmanM.D.   On: 11/11/2021 16:44   PERIPHERAL VASCULAR CATHETERIZATION  Result Date: 11/10/2021 Images from the original result were not included. Patient name: [name] SANMRN: [id number]: 624-May-1962Sex: male 11/10/2021 Pre-operative Diagnosis: Critical right lower extremity ischemia with toe ulceration and symptomatic right common iliac artery aneurysm post-operative diagnosis:  Same Surgeon:  BEda Paschal CDonzetta Matters MD Procedure Performed: 1.  Ultrasound-guided cannulation left common femoral artery 2.  Aortogram 3.  Selection of right hypogastric artery and right lower extremity angiogram 4.  Moderate sedation with fentanyl and Versed for 20 minutes Indications: 61year old male with toe ulceration with known occluded SFA and popliteal arteries found to have occluded external iliac artery and common femoral artery by CT reconstitution of the  profunda.  He is medicated for aortogram with right lower extremity to evaluate for surgical options Findings: Aorta was patent although mildly ectatic distally.  Bilateral renal arteries are patent without any stenosis.  Left common iliac artery is patent as is the atraumatic artery hypogastric artery on the left is occluded.  Common femoral is ectatic without any flow-limiting stenosis.  The right side the  common neck arteries initially patent gives off the hypogastric artery which is large the external neck artery is occluded with what appears to be thrombus there is reconstitution of the profunda on the right and then reconstitutes distally the posterior tibial artery in the mid to upper calf. Patient is being considered for aortic based bypass to the right groin possibly aortobifemoral bypass with need for bypass of the posterior tibial artery as well.  Procedure:  The patient was identified in the holding area and taken to room 8.  The patient was then placed supine on the table and prepped and draped in the usual sterile fashion.  A time out was called.  Ultrasound was used to evaluate the left common femoral artery.  This was noted to be somewhat ectatic.  There is no spasm percent lidocaine cannulated micropuncture needle followed by wire and sheath.  Images saved the permanent record.  Bentson wires placed followed by 5 [place] sheath.  Omni catheter was placed to L1 aortogram performed.  We crossed the bifurcation with a crossover catheter placed this in the hypogastric artery confirmed intraluminal access.  Omni catheter was placed right lower extremity angiography was performed.  With the above findings catheter was removed over wire.  Sheath will be pulled in postoperative holding.  He tolerated procedure without immediate complication Contrast: 37SE Brandon C. Donzetta Matters, MD [facility] Office: [phone] Pager: [phone]   DG Chest Port 1 View  Result Date:  11/27/2021 CLINICAL DATA:  Chest pain, abdominal aortic stenosis EXAM: PORTABLE CHEST 1 VIEW COMPARISON:  Chest x-ray from yesterday FINDINGS: Stable right IJ central venous catheter with its tip at the upper SVC region. Lung volumes are again low. Cardiomegaly. Left basilar atelectasis with likely superimposed left pleural effusion. IMPRESSION: Cardiomegaly. Low lung volumes with left basilar atelectasis. Possible superimposed left pleural effusion. There has been no significant interval change. Electronically Signed   By: Frazier Richards M.D.   On: 11/27/2021 08:25   DG Chest Port 1 View  Result Date: 11/26/2021 CLINICAL DATA:  Abdominal aortic stenosis. EXAM: PORTABLE CHEST 1 VIEW COMPARISON:  Chest radiograph and CT 11/12/2021 FINDINGS: Right internal jugular central venous catheter tip projects over the SVC. Lung volumes are low. Cardiomegaly. Left greater than right basilar atelectasis. Possible trace left pleural effusion. No pulmonary edema no pneumothorax. IMPRESSION: 1. Low lung volumes with left greater than right basilar atelectasis. Possible trace left pleural effusion. 2. Cardiomegaly. Electronically Signed   By: Keith Rake M.D.   On: 11/26/2021 19:05   DG Abd Portable 1V  Result Date: 11/26/2021 CLINICAL DATA:  Abdominal aortic stenosis. EXAM: PORTABLE ABDOMEN - 1 VIEW COMPARISON:  CT 11/11/2021 FINDINGS: No bowel dilatation to suggest obstruction. There is barium within colonic diverticula from prior CT. No evidence of free air. No visible radiopaque calculi. IMPRESSION: Nonobstructive bowel gas pattern. Electronically Signed   By: Keith Rake M.D.   On: 11/26/2021 19:07   DG Swallowing Func-Speech Pathology  Result Date: 12/08/2021 Table formatting from the original result was not included. Images from the original result were not included. 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  Right SFA 05/07/17 while on warfarin INR 2.9  Arterial  embolus and thrombosis of lower extremity ([place]) 05/25/2017  Right SFA 05/07/17 while on warfarin INR 2.9  Atrial fibrillation (HCC)   Benign essential HTN 01/04/2012  Chronic anticoagulation 01/02/2013  Dermatofibroma of forearm 01/02/2013  Left side  DVT, lower extremity, proximal ([place]) 05/24/2013  Right leg femoral & popliteal 05/24/13  Hyperlipidemia, mixed 01/04/2012  Hypothyroidism   Pneumonia   Polycythemia secondary to smoking 01/04/2012  Primary hypercoagulable state ([place]) 10/01/2014  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HCC)   Sinus bradycardia, chronic 01/04/2012  Superficial thrombosis of lower extremity 05/02/2012 Past Surgical History: Past Surgical History: Procedure Laterality Date  ABDOMINAL AORTOGRAM W/LOWER EXTREMITY Right 11/10/2021  Procedure: ABDOMINAL AORTOGRAM W/LOWER EXTREMITY;  Surgeon: Waynetta Sandy, MD;  Location: [facility];  Service: Cardiovascular;  Laterality: Right;  AORTA - BILATERAL FEMORAL ARTERY BYPASS GRAFT Bilateral 11/26/2021  Procedure: AORTO- BIILIAC ARTERY  BYPASS GRAFT USING HEMASHIELD 20 X 11MM, RIGHT ILIO-FEMORAL BYPASS  UMBILICAL HERNIA REPAIR;  Surgeon: Cherre Robins, MD;  Location: [facility];  Service: Vascular;  Laterality: Bilateral;  APPLICATION OF WOUND VAC Right 2/29/7989  Procedure: APPLICATION OF WOUND VAC;  Surgeon: Cherre Robins, MD;  Location: [facility];  Service: Vascular;  Laterality: Right;  DIRECT LARYNGOSCOPY Left 10/19/2018  Procedure: DIRECT LARYNGOSCOPY;  Surgeon: Leta Baptist, MD;  Location: [facility];  Service: ENT;  Laterality: Left;  FEMORAL-TIBIAL BYPASS GRAFT Right 11/26/2021  Procedure: RIGHT FEMORALTIBIAL   ARTERY BYPASS GRAFT USING PROPATEN GRAFT;  Surgeon: Cherre Robins, MD;  Location: [facility];  Service: Vascular;  Laterality: Right;  IR IMAGING GUIDED PORT INSERTION  11/04/2018  IR REMOVAL TUN ACCESS W/ PORT W/O FL MOD SED  05/01/2019  TONSILLECTOMY Left 10/19/2018  Procedure: BIOPSY OF LEFT TONSIL;  Surgeon: Leta Baptist, MD;  Location: [facility];  Service: ENT;  Laterality: Left; No data recorded No data recorded  Recommendations for follow up therapy are one component of a multi-disciplinary discharge planning process, led by the attending physician.  Recommendations may be updated based on patient status, additional functional criteria and insurance authorization. Assessment / Plan / Recommendation   12/05/2021   3:08 PM Clinical Impressions Clinical Impression Patient demonstrates a moderate to severe oropharyngeal dysphagia. Oral phase is characterized by prolonged AP transit and decreased bolus cohesion with intermittent premature spillage of thin liquids.  Solid textures were not attempted this study due to poor condition of dentition along with trismus resulting in decreased mastication and patient only consuming liquids at home per his report.  Pharyngeal phase is characterized by decreased anterior hyoid excursion and as well as significantly decreased strength of pharyngeal constrictors resulting in penetration of thin liquids during and after the swallow due to moderate pyriform sinus residue.  Patient's sensation was inconsistent at times but was essentially reliable in sensing penetrates. Patient was able to clear the majority of penetrates with spontaneous throat clears and subsequent swallows. However, cues needed for effort at time.  Increased residue noted with pureed textures and thin liquids via straw with postural changes ineffective for reducing. Trace silent aspiration observed with thin liquids via straw.      Although patient's dysphagia is chronic in nature, patient now has  increased risk factors for development of aspiration PNA (deconditioning, poor oral health, increased use of straws, suboptimal positioning). Provided education with the patient, his daughter and girlfriend of increased risk factors and the importance of oral care, upright positioning with remaining upright for 30 minutes after meals, removing straws and  utilizing effortful throat clears.  All verbalized understanding but suspect patient will need reinforcement. Recommend patient continue his baseline diet of thin liquids via cup (NO STRAWS). Discussed medication administration during MBSS, patient insistent on continuing to take medications whole with thin liquids. Reinforced the importance of utilization of strategies while taking medications and splitting large pills in half or dissolving them if able. Patient verbalized understanding. SLP Visit Diagnosis Dysphagia, oropharyngeal phase (R13.12) Impact on safety and function Severe aspiration risk      View : No data to display.        12/05/2021   3:09 PM Prognosis Prognosis for Safe Diet Advancement Fair Barriers to Reach Goals Time post onset;Severity of deficits   12/05/2021   3:08 PM Diet Recommendations SLP Diet Recommendations Thin liquid Liquid Administration via Cup;No straw Medication Administration Whole meds with liquid Compensations Minimize environmental distractions;Slow rate;Small sips/bites;Clear throat intermittently;Clear throat after each swallow;Effortful swallow Postural Changes Seated upright at 90 degrees;Remain semi-upright after after feeds/meals (Comment)     12/05/2021   3:08 PM Other Recommendations Oral Care Recommendations Oral care QID;Oral care before and after PO Follow Up Recommendations Outpatient SLP   12/05/2021   3:08 PM Frequency and Duration  Speech Therapy Frequency (ACUTE ONLY) min 2x/week Treatment Duration 1 week     12/05/2021   3:04 PM Oral Phase Oral Phase Impaired Oral - Thin Cup Lingual/palatal residue;Decreased bolus  cohesion;Delayed oral transit;Reduced posterior propulsion;Weak lingual manipulation Oral - Thin Straw Reduced posterior propulsion;Decreased bolus cohesion;Delayed oral transit;Lingual/palatal residue;Weak lingual manipulation Oral - Puree Lingual pumping;Lingual/palatal residue;Decreased bolus cohesion;Delayed oral transit;Reduced posterior propulsion;Weak lingual manipulation    12/05/2021   3:05 PM Pharyngeal Phase Pharyngeal Phase Impaired Pharyngeal- Thin Cup Delayed swallow initiation-pyriform sinuses;Reduced pharyngeal peristalsis;Reduced epiglottic inversion;Reduced anterior laryngeal mobility;Reduced airway/laryngeal closure;Penetration/Apiration after swallow;Pharyngeal residue - pyriform Pharyngeal Material enters airway, CONTACTS cords and then ejected out;Material enters airway, remains ABOVE vocal cords then ejected out Pharyngeal- Thin Straw Delayed swallow initiation-pyriform sinuses;Reduced pharyngeal peristalsis;Reduced epiglottic inversion;Reduced anterior laryngeal mobility;Reduced airway/laryngeal closure;Penetration/Aspiration during swallow;Penetration/Apiration after swallow;Trace aspiration;Pharyngeal residue - pyriform;Compensatory strategies attempted (with notebox) Pharyngeal Material enters airway, passes BELOW cords without attempt by patient to eject out (silent aspiration);Material enters airway, CONTACTS cords and not ejected out Pharyngeal- Puree Reduced airway/laryngeal closure;Reduced anterior laryngeal mobility;Reduced epiglottic inversion;Delayed swallow initiation-vallecula;Pharyngeal residue - pyriform Pharyngeal Material enters airway, CONTACTS cords and then ejected out Pharyngeal- Mechanical Soft NT Pharyngeal- Regular NT     View : No data to display.    [place] 12/08/2021, 1:14 PM      [place]on Anna, MA, CCC-SLP                ECHOCARDIOGRAM COMPLETE  Result Date: 11/09/2021    ECHOCARDIOGRAM REPORT   Patient Name:   [name] [name] Date of Exam: 11/09/2021  Medical Rec #:  [id number]      Height:       75.0 in Accession #:    [id number]     Weight:       238.1 lb Date of Birth:  Aug 24, 1960      BSA:          2.363 m Patient Age:    44 years       BP:           120/92 mmHg Patient Gender: M              HR:           85 bpm. Exam Location:  Inpatient  Procedure: 2D Echo, Cardiac Doppler and Color Doppler Indications:    Preoperative evaluation  History:        Patient has no prior history of Echocardiogram examinations.                 Arrythmias:Bradycardia and Atrial Fibrillation; Risk                 Factors:Dyslipidemia and Hypertension.  Sonographer:    Bernadene Person RDCS Referring Phys: [id number] [place]  1. Left ventricular ejection fraction, by estimation, is 60 to 65%. Left ventricular ejection fraction by 2D MOD biplane is 64.4 %. The left ventricle has normal function. The left ventricle has no regional wall motion abnormalities. There is mild left ventricular hypertrophy. Left ventricular diastolic function could not be evaluated.  2. Right ventricular systolic function is normal. The right ventricular size is normal. There is normal pulmonary artery systolic pressure. The estimated right ventricular systolic pressure is 27.7 mmHg.  3. Left atrial size was mildly dilated.  4. Right atrial size was moderately dilated.  5. The mitral valve is abnormal. Trivial mitral valve regurgitation.  6. The aortic valve is tricuspid. Aortic valve regurgitation is not visualized.  7. Aortic dilatation noted. There is mild dilatation of the aortic root, measuring 41 mm. There is moderate dilatation of the ascending aorta, measuring 46 mm.  8. The inferior vena cava is dilated in size with >50% respiratory variability, suggesting right atrial pressure of 8 mmHg. Comparison(s): No prior Echocardiogram. FINDINGS  Left Ventricle: Left ventricular ejection fraction, by estimation, is 60 to 65%. Left ventricular ejection fraction by 2D MOD biplane is 64.4 %. The  left ventricle has normal function. The left ventricle has no regional wall motion abnormalities. The left ventricular internal cavity size was normal in size. There is mild left ventricular hypertrophy. Left ventricular diastolic function could not be evaluated due to atrial fibrillation. Left ventricular diastolic function could not be evaluated. Right Ventricle: The right ventricular size is normal. No increase in right ventricular wall thickness. Right ventricular systolic function is normal. There is normal pulmonary artery systolic pressure. The tricuspid regurgitant velocity is 2.42 m/s, and  with an assumed right atrial pressure of 8 mmHg, the estimated right ventricular systolic pressure is 41.2 mmHg. Left Atrium: Left atrial size was mildly dilated. Right Atrium: Right atrial size was moderately dilated. Pericardium: There is no evidence of pericardial effusion. Mitral Valve: The mitral valve is abnormal. There is mild calcification of the anterior and posterior mitral valve leaflet(s). Trivial mitral valve regurgitation. Tricuspid Valve: The tricuspid valve is grossly normal. Tricuspid valve regurgitation is trivial. Aortic Valve: The aortic valve is tricuspid. Aortic valve regurgitation is not visualized. Pulmonic Valve: The pulmonic valve was normal in structure. Pulmonic valve regurgitation is not visualized. Aorta: Aortic dilatation noted. There is mild dilatation of the aortic root, measuring 41 mm. There is moderate dilatation of the ascending aorta, measuring 46 mm. Venous: The inferior vena cava is dilated in size with greater than 50% respiratory variability, suggesting right atrial pressure of 8 mmHg. IAS/Shunts: No atrial level shunt detected by color flow Doppler.  LEFT VENTRICLE PLAX 2D                        Biplane EF (MOD) LVIDd:         5.20 cm         LV Biplane EF:   Left LVIDs:  3.70 cm                          ventricular LV PW:         1.20 cm                          ejection  LV IVS:        1.60 cm                          fraction by LVOT diam:     2.40 cm                          2D MOD LV SV:         72                               biplane is LV SV Index:   31                               64.4 %. LVOT Area:     4.52 cm  LV Volumes (MOD) LV vol d, MOD    141.0 ml A2C: LV vol d, MOD    124.0 ml A4C: LV vol s, MOD    51.8 ml A2C: LV vol s, MOD    44.0 ml A4C: LV SV MOD A2C:   89.2 ml LV SV MOD A4C:   124.0 ml LV SV MOD BP:    86.3 ml RIGHT VENTRICLE RV S prime:     15.30 cm/s TAPSE (M-mode): 2.0 cm LEFT ATRIUM             Index        RIGHT ATRIUM           Index LA diam:        5.40 cm 2.29 cm/m   RA Area:     30.90 cm LA Vol (A2C):   89.9 ml 38.04 ml/m  RA Volume:   98.30 ml  41.60 ml/m LA Vol (A4C):   70.6 ml 29.88 ml/m LA Biplane Vol: 80.1 ml 33.90 ml/m  AORTIC VALVE LVOT Vmax:   103.00 cm/s LVOT Vmean:  70.000 cm/s LVOT VTI:    0.160 m  AORTA Ao Root diam: 4.10 cm Ao Asc diam:  4.60 cm TRICUSPID VALVE TR Peak grad:   23.4 mmHg TR Vmax:        242.00 cm/s  SHUNTS Systemic VTI:  0.16 m Systemic Diam: 2.40 cm Lyman Bishop MD Electronically signed by Lyman Bishop MD Signature Date/Time: 11/09/2021/12:47:57 PM    Final     Labs:  Basic Metabolic Panel: Recent Labs  Lab 12/03/21 0211 12/04/21 0533 12/04/21 1641 12/05/21 0520 12/06/21 0527 12/08/21 0637  NA 139 139 138 140 140 139  K 3.6 2.9* 3.3* 3.3* 3.3* 3.4*  CL 100 103 102 104 105 106  CO2 '28 27 28 27 26 25  '$ GLUCOSE 100* 102* 100* 101* 96 97  BUN '13 13 13 10 10 '$ 7*  CREATININE 1.05 0.84 0.85 0.83 0.74 0.87  CALCIUM 8.6* 8.6* 8.7* 8.8* 8.6* 8.6*  MG  --  1.7  --  1.9  --   --     CBC: Recent Labs  Lab 12/03/21 0211 12/04/21 0533 12/08/21 0637  WBC 8.7 7.4 7.3  NEUTROABS  --  5.5  --   HGB 12.5* 11.7* 11.6*  HCT 37.1* 35.3* 34.6*  MCV 92.8 91.7 90.6  PLT 300 301 277    CBG: No results for input(s): GLUCAP in the last 168 hours.  Family history.  Father with CVA.  Denies any colon cancer  esophageal cancer or rectal cancer  Brief HPI:   MELVERN RAMONE is a 61 y.o. 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 [name] and Dr. Carlis Abbott.  Postoperative hypotension requiring Neo as well as abdominal pain with nausea and acute blood loss anemia requiring 2 units packed red blood cells and 2 units fresh frozen plasma.  He is maintained on intravenous heparin transition to Xarelto 5/16.  Fluid overload treated with IV diuresis.  Acute blood loss anemia stable.  He is tolerating a liquid diet.  Physical Occupational Therapy working with patient continue to be limited by incisional pain weakness poor safety awareness monitoring of tachycardia.  Therapy evaluations completed due to patient decreased functional mobility was admitted for a comprehensive rehab program.   Hospital Course: KEYMON MCELROY was admitted to rehab 12/03/2021 for inpatient therapies to consist of PT, ST and OT at least three hours five days a week. Past admission physiatrist, therapy team and rehab RN have worked together to provide customized collaborative inpatient rehab.  Pertaining to patient's subacute ischemic right lower extremity status post aortobiiliac bypass, right iliofemoral bypass right femoral posterior tibial artery bypass umbilical hernia repair he would follow-up with vascular surgery.  History of PAF maintained on Xarelto monitoring for any bleeding episodes he also continued on metoprolol for heart rate control.  Acute  blood loss anemia stable he is hemoglobin 9.6.  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  Patient with history of tobacco marijuana use receiving counts regards to cessation of these products it was questionable if he would be compliant with these request.  Hospital stay complicated by fluid overload treated with Lasix x2 doses monitoring for any signs of fluid overload.  Bouts of hypokalemia maintained on low-dose potassium supplement   Blood pressures were monitored on TID basis and soft and monitored     Rehab course: During patient's stay in rehab weekly team conferences were held to monitor patient's progress, set goals and discuss barriers to discharge. At admission, patient required minimal assist 20 feet rolling walker minimal assist side-lying to sitting  Physical exam.  Blood pressure 107/91 pulse 100 temperature 97.7 respirations 20 oxygen saturation 90% room air Constitutional.  No acute distress HEENT.  Normocephalic Eyes.  Pupils round and reactive to light no discharge.nystagmus Neck.  Supple nontender no JVD without thyromegaly Cardiac regular rate rhythm without any extra sounds or murmur heard Respiratory effort normal no respiratory distress without wheeze Abdomen.  Soft nontender positive bowel sounds without rebound Extremities.  No cyanosis +1 edema Skin.  Right groin wound VAC initially in place no drainage  noted Neuro.  Cranial nerves II through XII intact alert oriented follows commands memory normal Musculoskeletal.  Strength 5/5 in bilateral lower extremities upper extremities 4+5/5 Hip flexion knee extension 4/5 and ankle plantarflexion dorsiflexion 4 -/5 tone normal.  He/She  has had improvement in activity tolerance, balance, postural control as well as ability to compensate for deficits. He/She has had improvement in functional use RUE/LUE  and RLE/LLE as well as improvement in awareness.   Patient ambulates 120 feet rolling walker trials without assistive device straight point cane.  Noted gait impairments worsened with decrease assistive device.  Rolling side to side independent.  Transfers sit to stand with supervision and ambulates to and from the sink side with rolling walker supervision.  Toilet access with grab bars contact-guard for transfers.  Lower body sponge bathing with supervision lower body dressing with short socks and sneakers with supervision.  Sink side self-care intermittent standing needing some assist for perihygiene.  Full family teaching completed plan discharged to home       Disposition: Discharge to home   Diet: Clear liquid diet  Special Instructions: No driving smoking alcohol or illicit drug use  Medications at discharge 1.  Tylenol as needed 2.  Aspirin 81 mg p.o. daily 3.  Lipitor 20 mg p.o. daily 4.  Colace 200 mg p.o. twice daily 5.  Synthroid 25 mcg p.o. daily 6.  Magnesium gluconate 250 mg p.o. nightly 7.  Robaxin 500 mg every 6 hours as needed muscle spasms 8.  Toprol-XL 25 mg p.o. daily 9.  Oxycodone 5 mg every 3 hours as needed pain 10.  Xarelto 20 mg p.o. daily 11.  Protonix 40 mg daily 12.  Klor-Con 20 mEq daily  30-35 minutes were spent completing discharge summary and discharge planning     Follow-up Information     Cherre Robins, MD Follow up in 2 week(s).   Specialties: Vascular Surgery, Interventional Cardiology Why: Office will call you to arrange your appt (sent) Contact information: [place] [address] [phone]         Courtney Heys, MD Follow up.   Specialty: Physical Medicine and Rehabilitation Why: No formal follow-up Contact information: [address] [place] [address] [phone]                 Signed: Cathlyn Parsons 12/09/2021, 12:23 PM

## 2021-12-09 NOTE — Progress Notes (Signed)
PROGRESS NOTE   Subjective/Complaints:  Pt reports that he takes his meds- however, according to documentation from nursing, refuses xarelto, ASA, protonix, stool meds and frequently the K+ which he's having dissolved in juice and then only drinking some of juice.   Pt was angry when asked about his med compliance.  Also thinks he wouldn't have swallowing issues at home- sees no reason cannot revert to regular diet when goes home as long as sits upright- says cannot sit upright here in bed and upset that cannot alk around as he chooses- also insists on getting out of here "ASAP".   Says wife is nurse and also 2 daughters live in town.  C?O diarrhea x2 last night- "nutrition bad on liquid diet".   ROS:  Pt denies SOB, abd pain, CP, N/V/C/D, and vision changes    Objective:   No results found. Recent Labs    12/08/21 0637  WBC 7.3  HGB 11.6*  HCT 34.6*  PLT 277    Recent Labs    12/08/21 0637  NA 139  K 3.4*  CL 106  CO2 25  GLUCOSE 97  BUN 7*  CREATININE 0.87  CALCIUM 8.6*     Intake/Output Summary (Last 24 hours) at 12/09/2021 0881 Last data filed at 12/08/2021 2334 Gross per 24 hour  Intake 360 ml  Output --  Net 360 ml         Physical Exam: Vital Signs Blood pressure 115/88, pulse 86, temperature 98.4 F (36.9 C), temperature source Oral, resp. rate 14, height 6' 3" (1.905 m), weight 109.5 kg, SpO2 96 %.    General: awake, alert, appropriate, laying in bed; easily sat up in bed to show me moved well; NAD HENT: conjugate gaze; oropharynx moist CV: regular rate; no JVD Pulmonary: CTA B/L; no W/R/R- good air movement GI: soft, NT, ND, (+)BS Psychiatric: angry about being asked questions Neurological: Ox3  R groin wound C/D/I Extremities:  No cyanosis, Large calf incision CDI, 1+ edema Psych: Pt's affect is appropriate. Skin: warm and dry , R leg calf incision Clean and intact without signs  of breakdown Neuro:  CN 2-12 intact, alert and oriented, follows commands, memory normal, sensation decreased in RLE to light touch Musculoskeletal:  Strength 5/5 in B/L UE, 4+-5/5 LLE strength  Hip flexion and Knee extension 4/5 and APF and DF 4-/5   Assessment/Plan: 1. Functional deficits which require 3+ hours per day of interdisciplinary therapy in a comprehensive inpatient rehab setting. Physiatrist is providing close team supervision and 24 hour management of active medical problems listed below. Physiatrist and rehab team continue to assess barriers to discharge/monitor patient progress toward functional and medical goals  Care Tool:  Bathing  Bathing activity did not occur: Refused (wife does/ assist with everything) Body parts bathed by patient: Right arm, Left arm, Chest, Abdomen, Front perineal area, Buttocks, Right upper leg, Left upper leg, Face, Right lower leg, Left lower leg   Body parts bathed by helper: Right lower leg, Left lower leg     Bathing assist Assist Level: Set up assist     Upper Body Dressing/Undressing Upper body dressing Upper body dressing/undressing activity did not occur (  including orthotics):  (wife assist with everything) What is the patient wearing?: Pull over shirt    Upper body assist Assist Level: Independent    Lower Body Dressing/Undressing Lower body dressing      What is the patient wearing?: Underwear/pull up, Pants     Lower body assist Assist for lower body dressing: Independent     Toileting Toileting    Toileting assist Assist for toileting: Supervision/Verbal cueing Assistive Device Comment: Indep urinal use, S for toilet   Transfers Chair/bed transfer  Transfers assist     Chair/bed transfer assist level: Supervision/Verbal cueing     Locomotion Ambulation   Ambulation assist      Assist level: Supervision/Verbal cueing Assistive device: Walker-rolling Max distance: 100   Walk 10 feet  activity   Assist     Assist level: Supervision/Verbal cueing Assistive device: Walker-rolling   Walk 50 feet activity   Assist    Assist level: Supervision/Verbal cueing Assistive device: Walker-rolling    Walk 150 feet activity   Assist Walk 150 feet activity did not occur: Safety/medical concerns (fatigue)         Walk 10 feet on uneven surface  activity   Assist Walk 10 feet on uneven surfaces activity did not occur: Safety/medical concerns         Wheelchair     Assist Is the patient using a wheelchair?: Yes Type of Wheelchair: Manual    Wheelchair assist level: Supervision/Verbal cueing Max wheelchair distance: 60    Wheelchair 50 feet with 2 turns activity    Assist        Assist Level: Supervision/Verbal cueing   Wheelchair 150 feet activity     Assist  Wheelchair 150 feet activity did not occur:  (fatigue)       Blood pressure 115/88, pulse 86, temperature 98.4 F (36.9 C), temperature source Oral, resp. rate 14, height 6' 3" (1.905 m), weight 109.5 kg, SpO2 96 %.  1. Functional deficits and debility secondary to subacute ischemia of the R lower extremity s/p aorto-bi-iliac bypass, right ilio-femoral bypass, right femoral-posterior tibial artery bypass, and umbilical hernia repair              -patient may not shower             -ELOS/Goals: 12-14 days             -Pt insistent on leaving ASAP and will not follow SLP precautions on swallowing at home- because "doesn't need them".   Team conference today to determine length of stay  Con't CIR- PT, OT and SLP 2.  PAF/DVTs: -Anticoagulation:  Pharmaceutical: Xarelto resumed 05/16  5/23- per chart, refusing intermittently.              -antiplatelet therapy: continue ASA 3. Pain: Continue Tylenol TID in addition to oxycodone prn 4. Mood: LCSW to follow for evaluation and support.              -antipsychotic agents: N/A 5. Neuropsych: This patient is capable of making  decisions on his own behalf. 6. Skin/Wound Care: Incisional VAC to stay in place 7-10 days per Dr. [name]. --Routine pressure relief measures.              --Add ensure TID for supplement.              -Continue to monitor surgical incisions  -Dr Luan Pulling following, appreciate assistance, twice daily dressing changes started  5/23- VAC off- Dr Luan Pulling saw yesterday- stable 7.  Fluids/Electrolytes/Nutrition:  Monitor I/O and for signs of overload. 8. PAF: Monitor HR TID and for any symptoms with activity-->has been up to 160's             --Continue metoprolol 25 mg/day 9. Fluid overload: Treated with lasix X 2 doses as well as runs of KCL for hypokalemia.  --Will order daily weights.  --recheck Mg level in am.  10. ABLA: Recheck CBC in am.              -5/18 Hemoglobin 11.7, stable follow 11. Abnormal LFTs: Resolving ALT 291-->68 and on statin. --5/18 ALT 31, resolved 12. Recent PNA/ ongoing Cough: Will recheck CXR to monitor for clearance. 13. Constipation: Will increase miralax to BID.   Add magnesium gluconate 223m HS 14. Tobacco use Advised to discontinue 15. Marijuana use. Advised to discontinue 16. Hypokalemia, asymptomatic, continue klor 464m BID.  17. Dysphagia with Trismus and poor dentition  -5/18 place SLP consult for assistance with diet choice and trismus therapy  -5/19 SLP completed MBS, has restarted thin liquids via cup  5/23- on thin liquids only- can have milk products, however pt insistent will not continue precautions at home 18. HTN: add magnesium gluconate 25073mS     I spent a total of 51   minutes on total care today- >50% coordination of care- due to d/w pt about swallowing precautions, med compliance, and dealing with his anger about these issues- also team conference today.             LOS: 6 days A FACE TO FACE EVALUATION WAS PERFORMED  Tysin Salada 12/09/2021, 9:03 AM

## 2021-12-10 DIAGNOSIS — R5381 Other malaise: Secondary | ICD-10-CM | POA: Diagnosis not present

## 2021-12-10 NOTE — Progress Notes (Signed)
Pt refused night medications. Pt educated on importance of not missing medications. Pt continued to refuse. Provider will be made aware.

## 2021-12-10 NOTE — Progress Notes (Signed)
PROGRESS NOTE   Subjective/Complaints:   Pt insists on leaving today- refused ALL night meds last night, again.   Will get outpt SLP, but cannot get H/H due to insurance.   Pt is absolutely refusing to get PEG and also refuses surgery for jaw/cancer treatment as well as to follow swallowing precautions.   ROS:  Pt denies SOB, abd pain, CP, N/V/C/D, and vision changes    Objective:   No results found.  Recent Labs    12/08/21 0637  WBC 7.3  HGB 11.6*  HCT 34.6*  PLT 277   Recent Labs    12/08/21 0637  NA 139  K 3.4*  CL 106  CO2 25  GLUCOSE 97  BUN 7*  CREATININE 0.87  CALCIUM 8.6*    Intake/Output Summary (Last 24 hours) at 12/10/2021 0831 Last data filed at 12/10/2021 0124 Gross per 24 hour  Intake 600 ml  Output 575 ml  Net 25 ml        Physical Exam: Vital Signs Blood pressure 128/85, pulse 95, temperature 98.4 F (36.9 C), resp. rate 18, height '6\' 3"'$  (1.905 m), weight 109.4 kg, SpO2 97 %.     General: awake, alert, appropriate, laying on side, sleeping initially; NAD HENT: conjugate gaze; oropharynx moist CV: regular rate; no JVD Pulmonary: CTA B/L; no W/R/R- good air movement GI: soft, NT, ND, (+)BS Psychiatric: appropriate Neurological: Ox3  R groin wound C/D/I Extremities:  No cyanosis, Large calf incision CDI, 1+ edema Psych: Pt's affect is appropriate. Skin: warm and dry , R leg calf incision Clean and intact without signs of breakdown Neuro:  CN 2-12 intact, alert and oriented, follows commands, memory normal, sensation decreased in RLE to light touch Musculoskeletal:  Strength 5/5 in B/L UE, 4+-5/5 LLE strength  Hip flexion and Knee extension 4/5 and APF and DF 4-/5   Assessment/Plan: 1. Functional deficits which require 3+ hours per day of interdisciplinary therapy in a comprehensive inpatient rehab setting. Physiatrist is providing close team supervision and 24 hour  management of active medical problems listed below. Physiatrist and rehab team continue to assess barriers to discharge/monitor patient progress toward functional and medical goals  Care Tool:  Bathing  Bathing activity did not occur: Refused (wife does/ assist with everything) Body parts bathed by patient: Right arm, Left arm, Chest, Abdomen, Front perineal area, Buttocks, Right upper leg, Left upper leg, Face, Right lower leg, Left lower leg   Body parts bathed by helper: Right lower leg, Left lower leg     Bathing assist Assist Level: Set up assist     Upper Body Dressing/Undressing Upper body dressing Upper body dressing/undressing activity did not occur (including orthotics):  (wife assist with everything) What is the patient wearing?: Pull over shirt    Upper body assist Assist Level: Independent    Lower Body Dressing/Undressing Lower body dressing      What is the patient wearing?: Underwear/pull up, Pants     Lower body assist Assist for lower body dressing: Independent     Toileting Toileting    Toileting assist Assist for toileting: Supervision/Verbal cueing Assistive Device Comment: Indep urinal use, S for toilet   Transfers  Chair/bed transfer  Transfers assist     Chair/bed transfer assist level: Supervision/Verbal cueing     Locomotion Ambulation   Ambulation assist      Assist level: Supervision/Verbal cueing Assistive device: Cane-straight Max distance: 200   Walk 10 feet activity   Assist     Assist level: Supervision/Verbal cueing Assistive device: Cane-straight   Walk 50 feet activity   Assist    Assist level: Supervision/Verbal cueing Assistive device: Cane-straight    Walk 150 feet activity   Assist Walk 150 feet activity did not occur: Safety/medical concerns (fatigue)  Assist level: Supervision/Verbal cueing Assistive device: Cane-straight    Walk 10 feet on uneven surface  activity   Assist Walk 10 feet  on uneven surfaces activity did not occur: Safety/medical concerns         Wheelchair     Assist Is the patient using a wheelchair?: No Type of Wheelchair: Manual    Wheelchair assist level: Supervision/Verbal cueing Max wheelchair distance: 60    Wheelchair 50 feet with 2 turns activity    Assist        Assist Level: Supervision/Verbal cueing   Wheelchair 150 feet activity     Assist  Wheelchair 150 feet activity did not occur:  (fatigue)       Blood pressure 128/85, pulse 95, temperature 98.4 F (36.9 C), resp. rate 18, height '6\' 3"'$  (1.905 m), weight 109.4 kg, SpO2 97 %.  1. Functional deficits and debility secondary to subacute ischemia of the R lower extremity s/p aorto-bi-iliac bypass, right ilio-femoral bypass, right femoral-posterior tibial artery bypass, and umbilical hernia repair              -patient may not shower             -ELOS/Goals: 12-14 days             -Pt insistent on leaving ASAP and will not follow SLP precautions on swallowing at home- because "doesn't need them".   D/c today- refusing swallowing precautions-  2.  PAF/DVTs: -Anticoagulation:  Pharmaceutical: Xarelto resumed 05/16  5/23- per chart, refusing intermittently.              -antiplatelet therapy: continue ASA 3. Pain: Continue Tylenol TID in addition to oxycodone prn 4. Mood: LCSW to follow for evaluation and support.              -antipsychotic agents: N/A 5. Neuropsych: This patient is capable of making decisions on his own behalf. 6. Skin/Wound Care: Incisional VAC to stay in place 7-10 days per Dr. [name]. --Routine pressure relief measures.              --Add ensure TID for supplement.              -Continue to monitor surgical incisions  -Dr Luan Pulling following, appreciate assistance, twice daily dressing changes started  5/23- VAC off- Dr Luan Pulling saw yesterday- stable 7. Fluids/Electrolytes/Nutrition:  Monitor I/O and for signs of overload. 8. PAF: Monitor HR TID  and for any symptoms with activity-->has been up to 160's             --Continue metoprolol 25 mg/day 9. Fluid overload: Treated with lasix X 2 doses as well as runs of KCL for hypokalemia.  --Will order daily weights.  --recheck Mg level in am.  10. ABLA: Recheck CBC in am.              -5/18 Hemoglobin 11.7, stable follow 11. Abnormal LFTs:  Resolving ALT 291-->68 and on statin. --5/18 ALT 31, resolved 12. Recent PNA/ ongoing Cough: Will recheck CXR to monitor for clearance. 13. Constipation: Will increase miralax to BID.   Add magnesium gluconate '250mg'$  HS 14. Tobacco use Advised to discontinue 15. Marijuana use. Advised to discontinue 16. Hypokalemia, asymptomatic, continue klor 11mq BID.  17. Dysphagia with Trismus and poor dentition  -5/18 place SLP consult for assistance with diet choice and trismus therapy  -5/19 SLP completed MBS, has restarted thin liquids via cup  5/23- on thin liquids only- can have milk products, however pt insistent will not continue precautions at home 18. HTN: add magnesium gluconate '250mg'$  HS 19. Dispo  5/24- doesn't need f/u with me      LOS: 7 days A FACE TO FACE EVALUATION WAS PERFORMED  Stasha Naraine 12/10/2021, 8:31 AM

## 2021-12-10 NOTE — Progress Notes (Signed)
Inpatient Rehabilitation Care Coordinator Discharge Note   Patient Details  Name: [name] [name] MRN: [id number] Date of Birth: 1961-02-27   Discharge location: HOME TO DAUGHTER;S FOR ONE WEEK THEN BACK TO HIS HOME  Length of Stay: 7 DAYS  Discharge activity level: SUPERVISION-MOD/I LEVEL  Home/community participation: HOME BODY  Patient response BJ:SEGBTD Literacy - How often do you need to have someone help you when you read instructions, pamphlets, or other written material from your doctor or pharmacy?: Sometimes  Patient response VV:OHYWVP Isolation - How often do you feel lonely or isolated from those around you?: Never  Services provided included: MD, RD, PT, OT, SLP, RN, CM, Pharmacy, SW  Financial Services:  Financial Services Utilized: Medicaid    Choices offered to/list presented to: PT AND DAUGHTER  Follow-up services arranged:  Outpatient, DME, Patient/Family has no preference for HH/DME agencies    Outpatient Servicies: [facility]-PT & SP WILL CONTACT TO SET UP APPOINTMENTS-PT MAY NOT GO ACCORDING TO HIM DME : [facility] AND 3 IN 1    Patient response to transportation need: Is the patient able to respond to transportation needs?: Yes In the past 12 months, has lack of transportation kept you from medical appointments or from getting medications?: No In the past 12 months, has lack of transportation kept you from meetings, work, or from getting things needed for daily living?: No    Comments (or additional information):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 GIVEN EXTRA SUPPLIES. PT REQUESTING TO DC SOONER AND MD AND TEAM FELT NOT PARTICIPATING AND AT LEVEL CAN BE MANAGED AT HOME BY LISA AND DAUGHTER'S.   Patient/Family verbalized understanding of follow-up arrangements:  Yes  Individual responsible for coordination of the follow-up plan: LISA-GIRLFRIEND [phone]  Confirmed correct DME delivered: Elease Hashimoto 12/10/2021     Tomiko Schoon, Gardiner Rhyme

## 2021-12-11 NOTE — Telephone Encounter (Signed)
error 

## 2021-12-12 ENCOUNTER — Telehealth: Payer: Self-pay | Admitting: Hematology and Oncology

## 2021-12-12 ENCOUNTER — Ambulatory Visit (INDEPENDENT_AMBULATORY_CARE_PROVIDER_SITE_OTHER): Payer: Medicaid Other | Admitting: Vascular Surgery

## 2021-12-12 VITALS — BP 110/76 | HR 71 | Temp 97.8°F | Resp 20 | Ht 75.0 in | Wt 241.0 lb

## 2021-12-12 DIAGNOSIS — I70221 Atherosclerosis of native arteries of extremities with rest pain, right leg: Secondary | ICD-10-CM

## 2021-12-12 NOTE — Telephone Encounter (Signed)
Per provider request called and left message about appointment reschedule call back number was left

## 2021-12-12 NOTE — Progress Notes (Signed)
Office Note      HPI: [name] [name] is a 61 y.o. ([birth year]) male presenting in follow up s/p 04/2022 for aortobifemoral bypass, right iliofemoral bypass, right femoral posterior tibial bypass and umbilical hernia repair by Dr. [name] and Dr. Carlis Abbott.  He presents today accompanied by his wife with concern for wound breakdown. Yesterday, and the right inguinal crease, he appreciated some white tissue with clear drainage.  He denies fever, chills.  No erythema, no induration.  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   Right SFA 05/07/17 while on warfarin INR 2.9   Atrial fibrillation ([place])    Benign essential HTN 01/04/2012   Chronic anticoagulation 01/02/2013   Dermatofibroma of forearm 01/02/2013   Left side   DVT, lower extremity, proximal ([place]) 05/24/2013   Right leg femoral & popliteal 05/24/13   Hyperlipidemia, mixed 01/04/2012   Hypothyroidism    Pneumonia    Polycythemia secondary to smoking 01/04/2012   Primary hypercoagulable state ([place]) 10/01/2014   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HCC)    Sinus bradycardia, chronic 01/04/2012   Superficial thrombosis of lower extremity 05/02/2012    Past Surgical History:  Procedure Laterality Date   ABDOMINAL AORTOGRAM W/LOWER EXTREMITY Right 11/10/2021   Procedure: ABDOMINAL AORTOGRAM W/LOWER EXTREMITY;  Surgeon: Waynetta Sandy, MD;  Location: [facility];  Service: Cardiovascular;  Laterality: Right;   AORTA - BILATERAL FEMORAL ARTERY BYPASS GRAFT Bilateral 11/26/2021   Procedure: AORTO- BIILIAC ARTERY  BYPASS GRAFT USING HEMASHIELD 20 X 11MM, RIGHT ILIO-FEMORAL BYPASS   UMBILICAL HERNIA REPAIR;  Surgeon: Cherre Curstin Schmale, MD;  Location: [facility];  Service: Vascular;  Laterality: Bilateral;   APPLICATION OF WOUND VAC Right 11/26/2021   Procedure: APPLICATION OF WOUND VAC;  Surgeon: Cherre Alexavier Tsutsui, MD;  Location: [facility];  Service: Vascular;  Laterality: Right;   DIRECT LARYNGOSCOPY Left 10/19/2018   Procedure: DIRECT LARYNGOSCOPY;  Surgeon: Leta Baptist, MD;  Location: [facility];  Service: ENT;  Laterality: Left;   FEMORAL-TIBIAL BYPASS GRAFT Right 11/26/2021   Procedure: RIGHT FEMORALTIBIAL  ARTERY BYPASS GRAFT USING PROPATEN GRAFT;  Surgeon: Cherre Adlai Sinning, MD;  Location[facility];  Service: Vascular;  Laterality: Right;   IR IMAGING GUIDED PORT INSERTION  11/04/2018   IR REMOVAL TUN ACCESS W/ PORT W/O FL MOD SED  05/01/2019   TONSILLECTOMY Left 10/19/2018   Procedure: BIOPSY OF LEFT TONSIL;  Surgeon: Leta Baptist, MD;  Location: [facility];  Service: ENT;  Laterality: Left;    Social History   Socioeconomic History   Marital status: Divorced    Spouse name: Not on file   Number of children: 2   Years of education: Not on file   Highest education level: Not on file  Occupational History   Not on file  Tobacco Use   Smoking status: Light Smoker    Packs/day: 0.50    Years: 30.00    Pack years: 15.00    Types: Cigarettes    Last attempt to quit: 10/19/2018    Years since quitting: 3.1   Smokeless tobacco: Never  Vaping Use   Vaping Use: Former   Substances: Nicotine, Flavoring  Devices: 1 cartridge q2-3 days, he tells me he is not using.   Substance and Sexual Activity   Alcohol use: Not Currently    Comment: 6 drinks per week, per patient   Drug use: Yes    Types: Marijuana   Sexual activity: Not on file  Other Topics Concern   Not on file  Social History Narrative   Not on file   Social Determinants of Health   Financial Resource Strain: Not on file  Food Insecurity: Not on file  Transportation Needs: Not on file   Physical Activity: Not on file  Stress: Not on file  Social Connections: Not on file  Intimate Partner Violence: Not on file   Family History  Problem Relation Age of Onset   Stroke Father     Current Outpatient Medications  Medication Sig Dispense Refill   acetaminophen (TYLENOL) 325 MG tablet Take 2 tablets (650 mg total) by mouth every 6 (six) hours.     aspirin EC 81 MG EC tablet Take 1 tablet (81 mg total) by mouth daily. Swallow whole. 30 tablet 11   atorvastatin (LIPITOR) 20 MG tablet Take 1 tablet (20 mg total) by mouth daily. 30 tablet 0   levothyroxine (SYNTHROID) 25 MCG tablet Take 1 tablet (25 mcg total) by mouth daily before breakfast. Take on empty stomach w/ water, 1 hour before other meds or food. 30 tablet 0   magnesium oxide (MAG-OX) 400 MG tablet Take 0.5 tablets (200 mg total) by mouth at bedtime. 30 tablet 0   methocarbamol (ROBAXIN) 500 MG tablet Take 1 tablet (500 mg total) by mouth every 6 (six) hours as needed for muscle spasms. 60 tablet 0   metoprolol succinate (TOPROL-XL) 25 MG 24 hr tablet Take 1 tablet (25 mg total) by mouth daily. 30 tablet 0   oxyCODONE (OXY IR/ROXICODONE) 5 MG immediate release tablet Take 1 tablet (5 mg total) by mouth every 3 (three) hours as needed for severe pain. 30 tablet 0   pantoprazole (PROTONIX) 40 MG tablet Take 1 tablet (40 mg total) by mouth daily with supper. 30 tablet 0   polyethylene glycol (MIRALAX / GLYCOLAX) 17 g packet Take 17 g by mouth 2 (two) times daily. 14 each 0   potassium chloride SA (KLOR-CON M) 20 MEQ tablet Take 1 tablet (20 mEq total) by mouth daily. 30 tablet 0   rivaroxaban (XARELTO) 20 MG TABS tablet Take 1 tablet (20 mg total) by mouth daily with supper. 30 tablet 0   No current facility-administered medications for this visit.    No Known Allergies   REVIEW OF SYSTEMS:  '[X]'$  denotes positive finding, '[ ]'$  denotes negative finding Cardiac  Comments:  Chest pain or chest pressure:    Shortness of  breath upon exertion:    Short of breath when lying flat:    Irregular heart rhythm:        Vascular    Pain in calf, thigh, or hip brought on by ambulation:    Pain in feet at night that wakes you up from your sleep:     Blood clot in your veins:    Leg swelling:         Pulmonary    Oxygen at home:    Productive cough:     Wheezing:         Neurologic    Sudden weakness in arms or legs:     Sudden numbness in arms or legs:     Sudden  onset of difficulty speaking or slurred speech:    Temporary loss of vision in one eye:     Problems with dizziness:         Gastrointestinal    Blood in stool:     Vomited blood:         Genitourinary    Burning when urinating:     Blood in urine:        Psychiatric    Major depression:         Hematologic    Bleeding problems:    Problems with blood clotting too easily:        Skin    Rashes or ulcers:        Constitutional    Fever or chills:      PHYSICAL EXAMINATION:  Vitals:   12/12/21 1506  BP: 110/76  Pulse: 71  Resp: 20  Temp: 97.8 F (36.6 C)  SpO2: 96%  Weight: 241 lb (109.3 kg)  Height: '6\' 3"'$  (1.905 m)    General:  WDWN in NAD; vital signs documented above Gait: Not observed HENT: WNL, normocephalic, poor dentition Pulmonary: normal non-labored breathing , without wheezing Cardiac: regular HR,  Abdomen: soft, NT, no masses Skin: without rashes Vascular Exam/Pulses:  Right Left  Radial 2+ (normal) 2+ (normal)  Ulnar 2+ (normal) 2+ (normal)                   Extremities: without ischemic changes, without Gangrene , without cellulitis; without open wounds;  Musculoskeletal: no muscle wasting or atrophy  Neurologic: A&O X 3;  No focal weakness or paresthesias are detected Psychiatric:  The pt has Normal affect.   Non-Invasive Vascular Imaging:          ASSESSMENT/PLAN: CATCHER DEHOYOS is a 61 y.o. male presenting 04/2022 for aortobifemoral bypass, right iliofemoral bypass, right femoral  posterior tibial bypass and umbilical hernia repair by Dr. [name] and Dr. Carlis Abbott.  Wounds healing appropriately, some dermal separation in the inguinal crease -this is mild.  Some serous drainage from the midline laparotomy.  No concern for dehiscence or wound healing. Excellent, multiphasic signal in the posterior tibial artery  Asked him to keep his scheduled follow-up with Dr. [name].  Call should any questions or concerns arise.   Broadus John, MD [facility] [phone]

## 2022-01-01 DIAGNOSIS — K047 Periapical abscess without sinus: Secondary | ICD-10-CM | POA: Diagnosis not present

## 2022-01-06 ENCOUNTER — Ambulatory Visit (INDEPENDENT_AMBULATORY_CARE_PROVIDER_SITE_OTHER): Payer: Medicaid Other | Admitting: Physician Assistant

## 2022-01-06 VITALS — BP 110/80 | HR 77 | Temp 97.6°F | Resp 18 | Ht 75.0 in | Wt 216.6 lb

## 2022-01-06 DIAGNOSIS — I739 Peripheral vascular disease, unspecified: Secondary | ICD-10-CM

## 2022-01-06 DIAGNOSIS — I70221 Atherosclerosis of native arteries of extremities with rest pain, right leg: Secondary | ICD-10-CM

## 2022-01-06 NOTE — Progress Notes (Signed)
POST OPERATIVE OFFICE NOTE    CC:  F/u for surgery  HPI:  This is a 61 y.o. male who is s/p aortobiiliac bypass, right iliofemoral bypass, and right femoral to posterior tibial bypass by Dr. [name] and Dr. Carlis Abbott on 11/26/2021.  This was performed due to AAA, right iliac aneurysm, occluded right common femoral aneurysm, and occluded popliteal aneurysm.  Patient states the incisions of the right leg and abdomen have completely healed.  He denies ambulation, rest pain, or nonhealing wounds of bilateral lower extremities.  He is tolerating a regular diet and having regular bowel movements.  Since the surgery he has had an another oral abscess.  He is in the process of seeing a specialist for jaw reconstruction.  He would like help with a referral to a new specialist at [facility].  No Known Allergies  Current Outpatient Medications  Medication Sig Dispense Refill   acetaminophen (TYLENOL) 325 MG tablet Take 2 tablets (650 mg total) by mouth every 6 (six) hours.     aspirin EC 81 MG EC tablet Take 1 tablet (81 mg total) by mouth daily. Swallow whole. 30 tablet 11   atorvastatin (LIPITOR) 20 MG tablet Take 1 tablet (20 mg total) by mouth daily. 30 tablet 0   levothyroxine (SYNTHROID) 25 MCG tablet Take 1 tablet (25 mcg total) by mouth daily before breakfast. Take on empty stomach w/ water, 1 hour before other meds or food. 30 tablet 0   magnesium oxide (MAG-OX) 400 MG tablet Take 0.5 tablets (200 mg total) by mouth at bedtime. 30 tablet 0   methocarbamol (ROBAXIN) 500 MG tablet Take 1 tablet (500 mg total) by mouth every 6 (six) hours as needed for muscle spasms. 60 tablet 0   metoprolol succinate (TOPROL-XL) 25 MG 24 hr tablet Take 1 tablet (25 mg total) by mouth daily. 30 tablet 0   oxyCODONE (OXY IR/ROXICODONE) 5 MG immediate release tablet Take 1 tablet (5 mg total) by mouth every 3 (three) hours as needed for severe pain. 30 tablet 0   pantoprazole (PROTONIX) 40 MG tablet Take 1 tablet (40  mg total) by mouth daily with supper. 30 tablet 0   polyethylene glycol (MIRALAX / GLYCOLAX) 17 g packet Take 17 g by mouth 2 (two) times daily. 14 each 0   potassium chloride SA (KLOR-CON M) 20 MEQ tablet Take 1 tablet (20 mEq total) by mouth daily. 30 tablet 0   rivaroxaban (XARELTO) 20 MG TABS tablet Take 1 tablet (20 mg total) by mouth daily with supper. 30 tablet 0   No current facility-administered medications for this visit.     ROS:  See HPI  Physical Exam:  Vitals:   01/06/22 1042  BP: 110/80  Pulse: 77  Resp: 18  Temp: 97.6 F (36.4 C)  TempSrc: Temporal  SpO2: 97%  Weight: 216 lb 9.6 oz (98.2 kg)  Height: '6\' 3"'$  (1.905 m)    Incision: Abdomen incision healed; ventral hernia noted and reducible; right groin and lower leg incision well-healed Extremities:  Palpable right PT pulse Neuro: Alert and oriented Abdomen:   Soft, nontender, reducible ventral hernia  Assessment/Plan:  This is a 61 y.o. male who is s/p: Aortobiiliac bypass, right iliofemoral bypass, right femoral to posterior tibial artery bypass by Dr. [name] and Dr. Carlis Abbott   -Bilateral lower extremities are well perfused and patient has a palpable right PT pulse.  All incisions are now completely healed.  Patient is tolerating a regular diet and having normal  bowel movements.  We will follow-up with a right lower extremity bypass duplex and ABI in 3 months time -At the patient's request we will also refer him to an ENT for head and neck surgery/reconstruction at [facility].  Patient has seen a specialist at [facility] however is having difficulty with follow-up   Dagoberto Ligas, PA-C [facility] [phone]  Clinic MD:  [name]

## 2022-01-08 ENCOUNTER — Telehealth: Payer: Self-pay | Admitting: Vascular Surgery

## 2022-01-08 NOTE — Telephone Encounter (Signed)
Sent referral to [facility] 01/08/22. They will contact patient with appt

## 2022-01-09 ENCOUNTER — Other Ambulatory Visit: Payer: Self-pay

## 2022-01-09 DIAGNOSIS — I70221 Atherosclerosis of native arteries of extremities with rest pain, right leg: Secondary | ICD-10-CM

## 2022-01-09 DIAGNOSIS — I739 Peripheral vascular disease, unspecified: Secondary | ICD-10-CM

## 2022-01-27 ENCOUNTER — Other Ambulatory Visit: Payer: Medicaid Other | Admitting: Student

## 2022-01-27 DIAGNOSIS — Z515 Encounter for palliative care: Secondary | ICD-10-CM | POA: Diagnosis not present

## 2022-01-27 DIAGNOSIS — M272 Inflammatory conditions of jaws: Secondary | ICD-10-CM

## 2022-01-27 DIAGNOSIS — R52 Pain, unspecified: Secondary | ICD-10-CM

## 2022-01-27 DIAGNOSIS — J432 Centrilobular emphysema: Secondary | ICD-10-CM | POA: Diagnosis not present

## 2022-01-27 DIAGNOSIS — K122 Cellulitis and abscess of mouth: Secondary | ICD-10-CM | POA: Diagnosis not present

## 2022-01-27 NOTE — Progress Notes (Signed)
[facility] Consult Note Telephone: [phone]  Fax: [phone]    Date of encounter: 01/27/22 10:00 AM PATIENT NAME: [name] [name] [place] [id number]   [phone] (home)  DOB: 06-26-1961 MRN: [id number] PRIMARY CARE PROVIDER:    Johny Blamer, DO,  [place] [address] [phone]  REFERRING PROVIDER:   Johny Blamer, DO [address] [place],  [place] [address] [phone]  RESPONSIBLE PARTY:    Contact Information     Name Relation Home Work Mobile   Poe,Lisa Significant other [phone]     Tejuan, Gholson Daughter   [phone]        Due to the COVID-19 crisis, this visit was done via telemedicine from my office and it was initiated and consent by this patient and or family.  I connected with  [name] on 01/27/22 by a video enabled telemedicine application and verified that I am speaking with the correct person using two identifiers.   I discussed the limitations of evaluation and management by telemedicine. The patient expressed understanding and agreed to proceed.                                    ASSESSMENT AND PLAN / RECOMMENDATIONS:   Advance Care Planning/Goals of Care: Goals include to maximize quality of life and symptom management. Patient/health care surrogate gave his/her permission to discuss. Our advance care planning conversation included a discussion about:    The value and importance of advance care planning  Experiences with loved ones who have been seriously ill or have died  Exploration of personal, cultural or spiritual beliefs that might influence medical decisions  Exploration of goals of care in the event of a sudden injury or illness  CODE STATUS: Full Code  Education provided on palliative medicine.  Reviewed goals of care; patient would like to follow-up with ENT in [facility] for second opinion.  Reviewed controlled  substance policy today; patient in agreement with policy provided by [facility] Controlled Substance Treatment Agreement regarding management of controlled substance. Urine drug screen ordered.  Symptom Management/Plan:  Pain-patient with chronic pain secondary to osteomyelitis of left jaw; also reports intermittent pain to right LE-patient has new referral with ENT in [place]. He would like additional information on benefits of surgery. Patient called yesterday for refill on his oxycodone, last filled by this provider in May. We discussed palliative medicine continuing to manage pain medications he will need to have a UDS as upon recent hospitalization, he admitted to marijuana usage. Patient reports it has been some time since using. He is advised that we cannot prescribe pain medications/opioids if he is using illegal drugs per our policy. He agrees to UDS and Controlled Substance Treatment agreement. Patient is currently using ibuprofen for pain management; recommend switching to acetaminophen for pain management.   Follow up Palliative Care Visit: Palliative care will continue to follow for complex medical decision making, advance care planning, and clarification of goals. Return in 6-8 weeks or prn.   This visit was coded based on medical decision making (MDM).  PPS: 50%  HOSPICE ELIGIBILITY/DIAGNOSIS: TBD  Chief Complaint: Palliative Medicine follow up visit; pain.  HISTORY OF PRESENT ILLNESS:  [name] [name] is a 61 y.o. year old male  with osteomyelitis of left jaw, squamous cell carcinoma of left tonsil, chronic pain, hypertension,  hyperlipidemia, hx of DVT and PE.  Patient with hospitalizations 4/21 through 4/27 due to critical limb ischemia of right lower extremity.  Hospitalizations 5/10 through 5/24 due to debility, status post aortobifemoral bypass surgery, abdominal aortic stenosis, debility, acute blood loss anemia, atrial fibrillation, DVT.  Patient reports being back to  functional baseline.  Able to complete normal routine as prior to hospitalization. Denies any open wounds, does endorse some edema to RLE. He continues to endorse chronic left jaw pain.  States pain is currently a 5 out of 10. He has been using ibuprofen PRN for pain management. Patient was treated for right abscessed tooth in June by urgent care.  He has had repeated abscessed tooth infections on left in past year.  He is currently taking Xarelto.  Patient reports he did lose weight with recent hospitalizations; appetite is back to baseline and he is consuming mostly fluids and soft foods per baseline.  Denies any worsening swallowing.     History obtained from review of EMR, discussion with primary team, and interview with family, facility staff/caregiver and/or Mr. Paschal.  I reviewed available labs, medications, imaging, studies and related documents from the EMR.  Records reviewed and summarized above.    Physical Exam:  Constitutional: NAD General: frail appearing EYES: anicteric sclera, lids intact, no discharge  ENMT: intact hearing Pulmonary: no increased work of breathing, no cough, room air GU: deferred MSK: moves all extremities, ambulatory Skin: no rashes; bandaid to left jaw Neuro:  no cognitive impairment Psych: non-anxious affect, A and O x 3 Hem/lymph/immuno: no widespread bruising   Thank you for the opportunity to participate in the care of Mr. Rosemond.  The palliative care team will continue to follow. Please call our office at [phone] if we can be of additional assistance.   Ezekiel Slocumb, NP   COVID-19 PATIENT SCREENING TOOL Asked and negative response unless otherwise noted:   Have you had symptoms of covid, tested positive or been in contact with someone with symptoms/positive test in the past 5-10 days? No

## 2022-02-04 ENCOUNTER — Encounter: Payer: Self-pay | Admitting: *Deleted

## 2022-02-04 NOTE — Progress Notes (Signed)
Received a request from [facility] NP to send orders to [facility] for a urine pain management screening profile. Orders faxed to [place] location. Confirmation fax received.

## 2022-02-06 ENCOUNTER — Telehealth: Payer: Self-pay

## 2022-02-06 DIAGNOSIS — K122 Cellulitis and abscess of mouth: Secondary | ICD-10-CM | POA: Diagnosis not present

## 2022-02-06 NOTE — Telephone Encounter (Signed)
302 pm.  Return call made to patient to advise order for drug screen was faxed to [facility] on [address] on Wednesday.  Instructed patient to call and confirm they have the order and to see what time he is able to come in.  Patient questioned why he did not hear from [facility] and I have advised him it is his responsibility to schedule that appointment once the order is sent over.  Patient is given the phone number for [facility] and will follow up.

## 2022-02-09 ENCOUNTER — Other Ambulatory Visit: Payer: Self-pay

## 2022-02-11 ENCOUNTER — Other Ambulatory Visit: Payer: Self-pay

## 2022-02-11 DIAGNOSIS — M272 Inflammatory conditions of jaws: Secondary | ICD-10-CM | POA: Diagnosis not present

## 2022-02-11 DIAGNOSIS — Z923 Personal history of irradiation: Secondary | ICD-10-CM | POA: Diagnosis not present

## 2022-02-11 DIAGNOSIS — Z85818 Personal history of malignant neoplasm of other sites of lip, oral cavity, and pharynx: Secondary | ICD-10-CM | POA: Diagnosis not present

## 2022-02-11 DIAGNOSIS — K122 Cellulitis and abscess of mouth: Secondary | ICD-10-CM | POA: Diagnosis not present

## 2022-02-17 ENCOUNTER — Other Ambulatory Visit: Payer: Self-pay

## 2022-02-18 ENCOUNTER — Telehealth: Payer: Self-pay | Admitting: *Deleted

## 2022-02-18 ENCOUNTER — Other Ambulatory Visit: Payer: Self-pay

## 2022-02-18 NOTE — Telephone Encounter (Signed)
3:41p Received a communication from palliative admin stating that patient called in stating the is currently at [facility] location in [place], and they are saying they do not have the orders for his urine drug screen so he can complete this today.  3:45p  Called and spoke with patient while he was still at [place]. He placed one of their staff members on the phone who also states they never received the order. Advised that I sent the order on 02/04/22 and received a confirmation fax that it was received successfully. I confirmed the fax number which is the same number I sent the original order to. I re-faxed the order at 3:52p. I did not receive a confirmation fax until 4:36p and by this time the lab was closed.(Closed at 4p)  Between me faxing the order and receiving the confirmation fax, I reached out to my supervisor who was able to put an order for the urine drug screen directly into [facility]'s portal. Requisition # is [id number]. Will contact [facility] in the morning to make sure that they have received the orders.

## 2022-02-20 ENCOUNTER — Telehealth: Payer: Self-pay | Admitting: *Deleted

## 2022-02-20 ENCOUNTER — Telehealth: Payer: Self-pay | Admitting: Student

## 2022-02-20 DIAGNOSIS — C760 Malignant neoplasm of head, face and neck: Secondary | ICD-10-CM | POA: Diagnosis not present

## 2022-02-20 DIAGNOSIS — J9811 Atelectasis: Secondary | ICD-10-CM | POA: Diagnosis not present

## 2022-02-20 DIAGNOSIS — R918 Other nonspecific abnormal finding of lung field: Secondary | ICD-10-CM | POA: Diagnosis not present

## 2022-02-20 DIAGNOSIS — M272 Inflammatory conditions of jaws: Secondary | ICD-10-CM | POA: Diagnosis not present

## 2022-02-20 DIAGNOSIS — M722 Plantar fascial fibromatosis: Secondary | ICD-10-CM | POA: Diagnosis not present

## 2022-02-20 DIAGNOSIS — I77810 Thoracic aortic ectasia: Secondary | ICD-10-CM | POA: Diagnosis not present

## 2022-02-20 DIAGNOSIS — R52 Pain, unspecified: Secondary | ICD-10-CM | POA: Diagnosis not present

## 2022-02-20 NOTE — Telephone Encounter (Signed)
LATE ENTRY for 02/19/22  3:39p Called patient and left a voicemail that it was verified that [facility] in [place], [place] has the order for his urine drug screen and that he can go to have this done tomorrow.  4:33p Received a call back from patient and I was able to tell him the same as noted above. He said "ok."

## 2022-02-20 NOTE — Telephone Encounter (Signed)
Palliative NP left message for patient to see if had been able to have lab work at [place] return call.

## 2022-02-23 ENCOUNTER — Telehealth: Payer: Self-pay | Admitting: Student

## 2022-02-23 ENCOUNTER — Telehealth: Payer: Self-pay | Admitting: *Deleted

## 2022-02-23 ENCOUNTER — Other Ambulatory Visit: Payer: Self-pay | Admitting: Internal Medicine

## 2022-02-23 ENCOUNTER — Other Ambulatory Visit: Payer: Self-pay | Admitting: *Deleted

## 2022-02-23 DIAGNOSIS — C090 Malignant neoplasm of tonsillar fossa: Secondary | ICD-10-CM

## 2022-02-23 DIAGNOSIS — Z7189 Other specified counseling: Secondary | ICD-10-CM

## 2022-02-23 DIAGNOSIS — I82409 Acute embolism and thrombosis of unspecified deep veins of unspecified lower extremity: Secondary | ICD-10-CM

## 2022-02-23 NOTE — Telephone Encounter (Signed)
Palliative NP notified PCP office; spoke with nurse Glenda. PCP is made aware that patient will be discharged from palliative services due to positive UDS for cannabinoid.

## 2022-02-23 NOTE — Telephone Encounter (Signed)
Received call from Dr. Konrad Dolores with [name] stating same as below. States they last prescribed oxycodone in April so he has been off awhile and should not need opiates going forward. States patient's behavior was "manipulative, and argumentative." States patient made multiple excuses to not have UTox and when he finally did it was positive for marijuana. He also believes patient has alcohol dependence. Patient also made complaints to BBB. [facility] has stopped seeing patient 2/2 these behaviors. He requests a call back from Provider for any questions or to discuss [phone].

## 2022-02-23 NOTE — Telephone Encounter (Signed)
Call from [facility] NP with [facility]- stated pt is seen by palliative care for pain management. She wanted to inform the pt's doctor, pt has been discharged from their services for a + urine drug screen. Stated it was + for cannabis.

## 2022-02-23 NOTE — Telephone Encounter (Signed)
Looks like a significant issue. Might be best to discuss this and find a treatment regimen within an office visit. I don't see any upcoming [facility] appointments scheduled.

## 2022-02-23 NOTE — Telephone Encounter (Signed)
Received call from patient. He states he has been followed by [facility] for the last several months. He states he is being discharged from their Palliative Care program due to + urine drug screen which he says is + THC only. He is upset and did not know who to call for his Oxycodone refills. He has not had a refill since 12/09/21 for # 30.  Advised that if he continued to need palliative care for his ongoing symptoms, our Palliative Care team could see him. Pt has an appt to see Dr. Lorenso Courier on 02/26/22  Dr. Lorenso Courier states he will refill his oxy today, pending Palliative care team referral.

## 2022-02-26 ENCOUNTER — Other Ambulatory Visit: Payer: Self-pay

## 2022-02-26 ENCOUNTER — Telehealth: Payer: Self-pay

## 2022-02-26 ENCOUNTER — Other Ambulatory Visit: Payer: Self-pay | Admitting: Hematology and Oncology

## 2022-02-26 ENCOUNTER — Inpatient Hospital Stay: Payer: Medicaid Other | Attending: Hematology and Oncology

## 2022-02-26 ENCOUNTER — Inpatient Hospital Stay: Payer: Medicaid Other | Admitting: Hematology and Oncology

## 2022-02-26 VITALS — BP 120/93 | HR 85 | Temp 97.8°F | Resp 16 | Wt 223.4 lb

## 2022-02-26 DIAGNOSIS — C090 Malignant neoplasm of tonsillar fossa: Secondary | ICD-10-CM

## 2022-02-26 DIAGNOSIS — F17210 Nicotine dependence, cigarettes, uncomplicated: Secondary | ICD-10-CM | POA: Diagnosis not present

## 2022-02-26 DIAGNOSIS — E032 Hypothyroidism due to medicaments and other exogenous substances: Secondary | ICD-10-CM | POA: Insufficient documentation

## 2022-02-26 DIAGNOSIS — Z79899 Other long term (current) drug therapy: Secondary | ICD-10-CM | POA: Diagnosis not present

## 2022-02-26 DIAGNOSIS — I82409 Acute embolism and thrombosis of unspecified deep veins of unspecified lower extremity: Secondary | ICD-10-CM

## 2022-02-26 DIAGNOSIS — M272 Inflammatory conditions of jaws: Secondary | ICD-10-CM | POA: Insufficient documentation

## 2022-02-26 LAB — CBC WITH DIFFERENTIAL (CANCER CENTER ONLY)
Abs Immature Granulocytes: 0.01 10*3/uL (ref 0.00–0.07)
Basophils Absolute: 0 10*3/uL (ref 0.0–0.1)
Basophils Relative: 1 %
Eosinophils Absolute: 0.3 10*3/uL (ref 0.0–0.5)
Eosinophils Relative: 6 %
HCT: 46 % (ref 39.0–52.0)
Hemoglobin: 15.4 g/dL (ref 13.0–17.0)
Immature Granulocytes: 0 %
Lymphocytes Relative: 23 %
Lymphs Abs: 1.3 10*3/uL (ref 0.7–4.0)
MCH: 29.3 pg (ref 26.0–34.0)
MCHC: 33.5 g/dL (ref 30.0–36.0)
MCV: 87.6 fL (ref 80.0–100.0)
Monocytes Absolute: 0.6 10*3/uL (ref 0.1–1.0)
Monocytes Relative: 11 %
Neutro Abs: 3.5 10*3/uL (ref 1.7–7.7)
Neutrophils Relative %: 59 %
Platelet Count: 204 10*3/uL (ref 150–400)
RBC: 5.25 MIL/uL (ref 4.22–5.81)
RDW: 17.4 % — ABNORMAL HIGH (ref 11.5–15.5)
WBC Count: 5.8 10*3/uL (ref 4.0–10.5)
nRBC: 0 % (ref 0.0–0.2)

## 2022-02-26 LAB — CMP (CANCER CENTER ONLY)
ALT: 10 U/L (ref 0–44)
AST: 14 U/L — ABNORMAL LOW (ref 15–41)
Albumin: 4.2 g/dL (ref 3.5–5.0)
Alkaline Phosphatase: 92 U/L (ref 38–126)
Anion gap: 6 (ref 5–15)
BUN: 10 mg/dL (ref 8–23)
CO2: 27 mmol/L (ref 22–32)
Calcium: 9.6 mg/dL (ref 8.9–10.3)
Chloride: 106 mmol/L (ref 98–111)
Creatinine: 0.84 mg/dL (ref 0.61–1.24)
GFR, Estimated: >60 mL/min (ref >60)
Glucose, Bld: 98 mg/dL (ref 70–99)
Potassium: 4.1 mmol/L (ref 3.5–5.1)
Sodium: 139 mmol/L (ref 135–145)
Total Bilirubin: 0.5 mg/dL (ref 0.3–1.2)
Total Protein: 7.2 g/dL (ref 6.5–8.1)

## 2022-02-26 MED ORDER — OXYCODONE HCL 5 MG PO TABS: 5.0000 mg | ORAL_TABLET | Freq: Four times a day (QID) | ORAL | 0 refills | Status: DC | PRN

## 2022-02-26 NOTE — Progress Notes (Signed)
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 TBenjamine [name] invasive SCCa, p16+; not a candidate for TORS -Late 10/2018 - 12/2018: definitive chemoradiation with weekly cisplatin  03/2019: end-of-treatment PET showed decrease in FDG avidity in the left tonsil but some residual soft tissue fullness, resolution of left LN disease   -2021:  Diffuse pharyngeal soft tissue thickening at the tongue base on multiple CT's  [facility]; no evidence of malignancy    #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 -12/2018: acute DVT involving R peroneal vein and L gastrocnemius vein; new arterial thrombosis involving R common femoral artery and popliteal arteries, due to artery aneurysm -Late 12/2018: progression of acute DVT in the LLE from gastrocnemius vein to the femoral, popliteal, posterior tibial and peroneal veins despite being on Eliquis  -Currently on warfarin  11/25/2020: Developed a right upper extremity deep and superficial vein thromboses secondary to line placement.  Started on Lovenox 1 mg/kg every 12 hours. 02/21/2021: Transition  to Xarelto therapy.  Interval History:  [name] LUFFMAN89y.o. male reports he has continued to have discomfort with his jaw in the interim since her last visit.  He reports that he underwent a an aorto by iliac artery bypass graft on 11/26/2021.  He has been healing well from the procedure in the interim since that visit.  He notes that his surgeon from the procedure [facility].  He reports that he continues to take oxycodone approximately 3 pills/day.  He notes that the oxycodone helps with his jaw pain as well as helping him sleep" drying out my mouth".  He notes that his secretions have been quite thick and difficult to deal with.  He notes that his appetite has been poor and his weight tends to fluctuate up and down.  He has been a lot of milk with Carnation ordered to help with his weight.  He is also taking a multivitamin.  He denies any fevers, chills, night sweats, shortness of breath, chest pain or cough. He has no other complaints. A full 10 point ROS is listed below.  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[facility] 05/25/2017   Right SFA 05/07/17 while on warfarin INR 2.9   Atrial fibrillation (HCC)    Benign essential HTN 01/04/2012   Chronic anticoagulation 01/02/2013   Dermatofibroma of forearm 01/02/2013   Left side   DVT, lower extremity, proximal (  [place]) 05/24/2013   Right leg femoral & popliteal 05/24/13   Hyperlipidemia, mixed 01/04/2012   Hypothyroidism    Pneumonia    Polycythemia secondary to smoking 01/04/2012   Primary hypercoagulable state ([place]) 10/01/2014   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HCC)    Sinus bradycardia, chronic  01/04/2012   Superficial thrombosis of lower extremity 05/02/2012    SURGICAL HISTORY: Past Surgical History:  Procedure Laterality Date   ABDOMINAL AORTOGRAM W/LOWER EXTREMITY Right 11/10/2021   Procedure: ABDOMINAL AORTOGRAM W/LOWER EXTREMITY;  Surgeon: [name] Sandy, MD;  Location: [facility];  Service: Cardiovascular;  Laterality: Right;   AORTA - BILATERAL FEMORAL ARTERY BYPASS GRAFT Bilateral 11/26/2021   Procedure: AORTO- BIILIAC ARTERY  BYPASS GRAFT USING HEMASHIELD 20 X 11MM, RIGHT ILIO-FEMORAL BYPASS  UMBILICAL HERNIA REPAIR;  Surgeon: [name] Robins, MD;  Location: [facility];  Service: Vascular;  Laterality: Bilateral;   APPLICATION OF WOUND VAC Right 11/26/2021   Procedure: APPLICATION OF WOUND VAC;  Surgeon: [name] Robins, MD;  Location: [facility];  Service: Vascular;  Laterality: Right;   DIRECT LARYNGOSCOPY Left 10/19/2018   Procedure: DIRECT LARYNGOSCOPY;  Surgeon: [name] Baptist, MD;  Location: [facility];  Service: ENT;  Laterality: Left;   FEMORAL-TIBIAL BYPASS GRAFT Right 11/26/2021   Procedure: RIGHT FEMORALTIBIAL  ARTERY BYPASS GRAFT USING PROPATEN GRAFT;  Surgeon: [name] Robins, MD;  Location: [facility];  Service: Vascular;  Laterality: Right;   IR IMAGING GUIDED PORT INSERTION  11/04/2018   IR REMOVAL TUN ACCESS W/ PORT W/O FL MOD SED  05/01/2019   TONSILLECTOMY Left 10/19/2018   Procedure: BIOPSY OF LEFT TONSIL;  Surgeon: [name] Baptist, MD;  Location: [facility];  Service: ENT;  Laterality: Left;    SOCIAL HISTORY: Social History   Socioeconomic History   Marital status: Divorced    Spouse name: Not on file   Number of children: 2   Years of education: Not on file   Highest education level: Not on file  Occupational History   Not on file  Tobacco Use   Smoking status: Light Smoker    Packs/day: 0.50    Years: 30.00    Total pack years: 15.00    Types: Cigarettes    Last attempt to quit: 10/19/2018    Years since quitting: 3.3    Smokeless tobacco: Never  Vaping Use   Vaping Use: Former   Substances: Nicotine, Flavoring   Devices: 1 cartridge q2-3 days, he tells me he is not using.   Substance and Sexual Activity   Alcohol use: Not Currently    Comment: 6 drinks per week, per patient   Drug use: Yes    Types: Marijuana   Sexual activity: Not on file  Other Topics Concern   Not on file  Social History Narrative   Not on file   Social Determinants of Health   Financial Resource Strain: Not on file  Food Insecurity: Not on file  Transportation Needs: No Transportation Needs (10/21/2018)   PRAPARE - Hydrologist (Medical): No    Lack of Transportation (Non-Medical): No  Physical Activity: Not on file  Stress: Not on file  Social Connections: Not on file  Intimate Partner Violence: Not At Risk (10/21/2018)   Humiliation, Afraid, Rape, and Kick questionnaire    Fear of Current or Ex-Partner: No    Emotionally Abused: No    Physically Abused: No    Sexually Abused: No  FAMILY HISTORY: Family History  Problem Relation Age of Onset   Stroke Father     ALLERGIES:  has No Known Allergies.  MEDICATIONS:  Current Outpatient Medications  Medication Sig Dispense Refill   ibuprofen (ADVIL) 200 MG tablet Take 200 mg by mouth every 6 (six) hours as needed.     acetaminophen (TYLENOL) 325 MG tablet Take 2 tablets (650 mg total) by mouth every 6 (six) hours.     aspirin EC 81 MG EC tablet Take 1 tablet (81 mg total) by mouth daily. Swallow whole. 30 tablet 11   atorvastatin (LIPITOR) 20 MG tablet Take 1 tablet (20 mg total) by mouth daily. 30 tablet 0   levothyroxine (SYNTHROID) 25 MCG tablet Take 1 tablet (25 mcg total) by mouth daily before breakfast. Take on empty stomach w/ water, 1 hour before other meds or food. 30 tablet 0   magnesium oxide (MAG-OX) 400 MG tablet Take 0.5 tablets (200 mg total) by mouth at bedtime. (Patient not taking: Reported on 02/26/2022) 30 tablet 0    methocarbamol (ROBAXIN) 500 MG tablet Take 1 tablet (500 mg total) by mouth every 6 (six) hours as needed for muscle spasms. 60 tablet 0   metoprolol succinate (TOPROL-XL) 25 MG 24 hr tablet TAKE 1 TABLET (25 MG TOTAL) BY MOUTH DAILY. 90 tablet 3   oxyCODONE (OXY IR/ROXICODONE) 5 MG immediate release tablet Take 1 tablet (5 mg total) by mouth every 6 (six) hours as needed for severe pain. 90 tablet 0   pantoprazole (PROTONIX) 40 MG tablet Take 1 tablet (40 mg total) by mouth daily with supper. (Patient not taking: Reported on 02/26/2022) 30 tablet 0   polyethylene glycol (MIRALAX / GLYCOLAX) 17 g packet Take 17 g by mouth 2 (two) times daily. 14 each 0   potassium chloride SA (KLOR-CON M) 20 MEQ tablet Take 1 tablet (20 mEq total) by mouth daily. (Patient not taking: Reported on 02/26/2022) 30 tablet 0   rivaroxaban (XARELTO) 20 MG TABS tablet Take 1 tablet (20 mg total) by mouth daily with supper. 30 tablet 0   No current facility-administered medications for this visit.    REVIEW OF SYSTEMS:   Constitutional: ( - ) fevers, ( - )  chills , ( - ) night sweats Eyes: ( - ) blurriness of vision, ( - ) double vision, ( - ) watery eyes Ears, nose, mouth, throat, and face: ( - ) mucositis, ( - ) sore throat Respiratory: ( - ) cough, ( - ) dyspnea, ( - ) wheezes Cardiovascular: ( - ) palpitation, ( - ) chest discomfort, ( - ) lower extremity swelling Gastrointestinal:  ( - ) nausea, ( - ) heartburn, ( - ) change in bowel habits Skin: ( - ) abnormal skin rashes Lymphatics: ( - ) new lymphadenopathy, ( - ) easy bruising Neurological: ( - ) numbness, ( - ) tingling, ( - ) new weaknesses Behavioral/Psych: ( - ) mood change, ( - ) new changes  All other systems were reviewed with the patient and are negative.  PHYSICAL EXAMINATION: ECOG PERFORMANCE STATUS: 1 - Symptomatic but completely ambulatory  Vitals:   02/26/22 1429  BP: (!) 120/93  Pulse: 85  Resp: 16  Temp: 97.8 F (36.6 C)  SpO2: 98%    Filed Weights   02/26/22 1429  Weight: 223 lb 6.4 oz (101.3 kg)    GENERAL: well appearing middle aged Caucasian male alert, no distress and comfortable SKIN: skin color, texture, turgor are normal, no  rashes or significant lesions EYES: conjunctiva are pink and non-injected, sclera clear OROPHARYNX: exam limited as patient cannot open his mouth wide. Thick salivations noted  LUNGS: clear to auscultation and percussion with normal breathing effort HEART: regular rate & rhythm and no murmurs and no lower extremity edema MUSCULOSKELETAL: Well-healing surgical scar on right lower extremity.  Bilateral erythema with risk capillary refill. PSYCH: alert & oriented x 3, fluent speech NEURO: no focal motor/sensory deficits  LABORATORY DATA:  I have reviewed the data as listed    Latest Ref Rng & Units 02/26/2022    2:06 PM 12/08/2021    6:37 AM 12/04/2021    5:33 AM  CBC  WBC 4.0 - 10.5 K/uL 5.8  7.3  7.4   Hemoglobin 13.0 - 17.0 g/dL 15.4  11.6  11.7   Hematocrit 39.0 - 52.0 % 46.0  34.6  35.3   Platelets 150 - 400 K/uL 204  277  301        Latest Ref Rng & Units 02/26/2022    2:06 PM 12/08/2021    6:37 AM 12/06/2021    5:27 AM  CMP  Glucose 70 - 99 mg/dL 98  97  96   BUN 8 - 23 mg/dL '10  7  10   '$ Creatinine 0.61 - 1.24 mg/dL 0.84  0.87  0.74   Sodium 135 - 145 mmol/L 139  139  140   Potassium 3.5 - 5.1 mmol/L 4.1  3.4  3.3   Chloride 98 - 111 mmol/L 106  106  105   CO2 22 - 32 mmol/L '27  25  26   '$ Calcium 8.9 - 10.3 mg/dL 9.6  8.6  8.6   Total Protein 6.5 - 8.1 g/dL 7.2     Total Bilirubin 0.3 - 1.2 mg/dL 0.5     Alkaline Phos 38 - 126 U/L 92     AST 15 - 41 U/L 14     ALT 0 - 44 U/L 10       RADIOGRAPHIC STUDIES: No results found.   ASSESSMENT & PLAN [name] [name] 61 y.o. male with medical history significant for squamous cell carcinoma of left tonsil presents for a follow up visit.    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 Lennox Laity, ENT at [facility] -Multiple discussions were held with radiation oncology and ENT at [facility].  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 Patient holding on that plan at this time.  --Patient was referred to [facility]NT for further evaluation.  He is looking forward to this visit. -RTC in 6 months  #Hypothyroidisim, radiation induced --current on levothyroxine 26 mcg PO daily.  --TSH and T4 is pending today --Recommend to continue current dose of levothyroxine.   #Recurrent LLE DVT #Acute Right Upper Extremity DVT/SVT --patient had a midline placed for abx, developed a subsequent RUE DVT --this is considered provoked in the setting of a catheter, though patient is prone to clotting. --previously on  therapeutic '1mg'$ /kg lovenox x 3 months since May 2022. HOLD coumadin. -- Recommended the patient not return to Coumadin therapy --continue Xarelto '20mg'$  PO daily.  --Labs today show white blood cell count 5.8, hemoglobin 15.4, MCV 87.6, and platelets of 204 --Patient requires indefinite anticoagulation   #Pain Control -- Connected Mr. Tally with our palliative care service today. --Today we we will refill his oxycodone '5mg'$  q6H PRN  No orders of the defined types were placed in this encounter.   All questions were answered. The patient knows to call the clinic with any problems, questions or concerns.  I have spent a total  of 30 minutes minutes of face-to-face and non-face-to-face time, preparing to see the patient, obtaining and/or reviewing separately obtained history, performing a medically appropriate examination, counseling and educating the patient, documenting clinical information in the electronic health record, and care coordination.   Ledell Peoples, MD Department of Hematology/Oncology [place] at [facility] Phone: [phone] Pager: [phone] Email: [email or web address]

## 2022-02-26 NOTE — Telephone Encounter (Signed)
Pt's wife, [name], called stating that the pt has a "bump sticking out at his incision at his belly button". She said they are at another doctor's office in the area and can "pop over to have someone take a peek at it".  Reviewed pt's chart, returned call for clarification, two identifiers used. Pt states that the bump "popped up" a couple of weeks ago. It was originally smaller, but has grown to approx 1/2 baseball size. It is firm to the touch and slightly sore when palpated. He denies any fevers, redness, or N/V. He mentioned that another provider thought it may be a hernia. He denies any infectious symptoms. Appt scheduled to be evaluated. Confirmed understanding.

## 2022-02-26 NOTE — Telephone Encounter (Signed)
Patient sent message via my chart to contact our office to schedule an appointment. 

## 2022-02-27 ENCOUNTER — Telehealth: Payer: Self-pay

## 2022-02-27 LAB — TSH: TSH: 14.236 u[IU]/mL — ABNORMAL HIGH (ref 0.350–4.500)

## 2022-02-27 NOTE — Telephone Encounter (Signed)
Notified Patient and Pharmacy of prior authorization approval for Oxycodone HCL '5mg'$  tablets. Medication is approved through 08/26/2022. No other needs or concerns voiced at this time.

## 2022-02-28 ENCOUNTER — Other Ambulatory Visit: Payer: Self-pay

## 2022-02-28 LAB — T4: T4, Total: 5.6 ug/dL (ref 4.5–12.0)

## 2022-03-02 NOTE — Progress Notes (Unsigned)
[facility] [place] PROGRESS NOTE  ASSESSMENT / PLAN: [name] [name] is a 61 y.o. 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  He has an incisional hernia about his umbilicus.  This does not need urgent repair.  I counseled him to proceed with his head neck surgery before discussing with [facility] surgery about incisional hernia repair.  He is understanding.  We will keep this current surveillance plan for surveillance of revascularization.  SUBJECTIVE: 61 y.o. male well known to me s/p  1) aorto-bi-iliac bypass (20x53m Dacron) 2) right ilio-femoral bypass (839mDacron) 3) right femoral - posterior tibial artery bypass (41m541m4) umbilical hernia repair  On 11/26/21. over the past several weeks he has noticed development of a bulge around his umbilicus.  This has not been associated with any intestinal obstructive symptoms.  It is moderately painful but easily reducible.  OBJECTIVE: BP 115/87 (BP Location: Left Arm, Patient Position: Sitting, Cuff Size: Normal)   Pulse 74   Temp 97.7 F (36.5 C)   Resp 20   Ht '6\' 3"'$  (1.905 m)   Wt 100.7 kg   SpO2 96%   BMI 27.75 kg/m   Distress Regular rate and rhythm Unlabored breathing Abdomen with new midline deficit about the umbilicus which is broad-based about 4 x 4 cm.  This is easily reducible.  It is moderately tender.     Latest Ref Rng & Units 02/26/2022    2:06 PM 12/08/2021    6:37 AM 12/04/2021    5:33 AM  CBC  WBC 4.0 - 10.5 K/uL 5.8  7.3  7.4   Hemoglobin 13.0 - 17.0 g/dL 15.4  11.6  11.7   Hematocrit 39.0 - 52.0 % 46.0  34.6  35.3   Platelets 150 - 400 K/uL 204  277  301         Latest Ref Rng & Units 02/26/2022    2:06 PM 12/08/2021    6:37 AM 12/06/2021    5:27 AM  CMP  Glucose 70 - 99 mg/dL 98  97  96   BUN 8 - 23 mg/dL '10  7  10   '$ Creatinine 0.61 - 1.24  mg/dL 0.84  0.87  0.74   Sodium 135 - 145 mmol/L 139  139  140   Potassium 3.5 - 5.1 mmol/L 4.1  3.4  3.3   Chloride 98 - 111 mmol/L 106  106  105   CO2 22 - 32 mmol/L '27  25  26   '$ Calcium 8.9 - 10.3 mg/dL 9.6  8.6  8.6   Total Protein 6.5 - 8.1 g/dL 7.2     Total Bilirubin 0.3 - 1.2 mg/dL 0.5     Alkaline Phos 38 - 126 U/L 92     AST 15 - 41 U/L 14     ALT 0 - 44 U/L 10       Estimated Creatinine Clearance: 109 mL/min (by C-G formula based on SCr of 0.84 mg/dL).   ThoYevonne AlineawStanford BreedD [facility] Number: ([id number]/2023 4:50 PM

## 2022-03-03 ENCOUNTER — Ambulatory Visit: Payer: Medicaid Other | Admitting: Vascular Surgery

## 2022-03-03 ENCOUNTER — Encounter: Payer: Self-pay | Admitting: Vascular Surgery

## 2022-03-03 VITALS — BP 115/87 | HR 74 | Temp 97.7°F | Resp 20 | Ht 75.0 in | Wt 222.0 lb

## 2022-03-03 DIAGNOSIS — K432 Incisional hernia without obstruction or gangrene: Secondary | ICD-10-CM | POA: Diagnosis not present

## 2022-03-04 ENCOUNTER — Ambulatory Visit: Payer: Medicaid Other | Admitting: Hematology and Oncology

## 2022-03-04 ENCOUNTER — Other Ambulatory Visit: Payer: Medicaid Other

## 2022-03-16 ENCOUNTER — Other Ambulatory Visit: Payer: Self-pay

## 2022-03-16 ENCOUNTER — Inpatient Hospital Stay (HOSPITAL_BASED_OUTPATIENT_CLINIC_OR_DEPARTMENT_OTHER): Payer: Medicaid Other | Admitting: Nurse Practitioner

## 2022-03-16 ENCOUNTER — Encounter: Payer: Self-pay | Admitting: Nurse Practitioner

## 2022-03-16 VITALS — BP 131/89 | HR 101 | Temp 97.6°F | Resp 18 | Ht 75.0 in | Wt 226.4 lb

## 2022-03-16 DIAGNOSIS — M272 Inflammatory conditions of jaws: Secondary | ICD-10-CM

## 2022-03-16 DIAGNOSIS — C090 Malignant neoplasm of tonsillar fossa: Secondary | ICD-10-CM

## 2022-03-16 DIAGNOSIS — F17210 Nicotine dependence, cigarettes, uncomplicated: Secondary | ICD-10-CM | POA: Diagnosis not present

## 2022-03-16 DIAGNOSIS — Z515 Encounter for palliative care: Secondary | ICD-10-CM

## 2022-03-16 DIAGNOSIS — E032 Hypothyroidism due to medicaments and other exogenous substances: Secondary | ICD-10-CM

## 2022-03-16 DIAGNOSIS — G893 Neoplasm related pain (acute) (chronic): Secondary | ICD-10-CM

## 2022-03-16 DIAGNOSIS — K5903 Drug induced constipation: Secondary | ICD-10-CM

## 2022-03-16 DIAGNOSIS — R630 Anorexia: Secondary | ICD-10-CM

## 2022-03-17 ENCOUNTER — Encounter: Payer: Self-pay | Admitting: Hematology

## 2022-03-17 NOTE — Progress Notes (Signed)
[place]  [phone]) [phone] [phone]) [phone]   Name: [name] [name] Date: 03/17/2022 MRN: [id number]  DOB: 19-Feb-1961  Patient Care Team: System, Provider Not In as PCP - General Beryle Beams Alyson Locket, MD as Consulting Physician (Oncology) Leta Baptist, MD as Consulting Physician (Otolaryngology) Eppie Gibson, MD as Attending Physician (Radiation Oncology) Tish Men, MD (Inactive) as Consulting Physician (Hematology) Leota Sauers, RN (Inactive) as Oncology Nurse Navigator Pickenpack-Cousar, Carlena Sax, NP as Nurse Practitioner (Nurse Practitioner)    REASON FOR CONSULTATION: [name] [name] is a 61 y.o. 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 Palliative ask to see for symptom management.     SOCIAL HISTORY:     reports that he has been smoking cigarettes. He has a 15.00 pack-year smoking history. He has never used smokeless tobacco. He reports that he does not currently use alcohol. He reports current drug use. Drug: Marijuana.  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[place]) 05/25/2017   Right SFA 05/07/17 while on warfarin INR 2.9   Atrial fibrillation ([place])    Benign essential HTN 01/04/2012   Chronic anticoagulation 01/02/2013   Dermatofibroma of forearm 01/02/2013   Left side   DVT, lower extremity, proximal ([place]) 05/24/2013   Right leg femoral & popliteal 05/24/13   Hyperlipidemia, mixed 01/04/2012   Hypothyroidism    Pneumonia    Polycythemia secondary to smoking 01/04/2012   Primary hypercoagulable state ([place])  10/01/2014   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HCC)    Sinus bradycardia, chronic 01/04/2012   Superficial thrombosis of lower extremity 05/02/2012    PAST SURGICAL HISTORY:  Past Surgical History:  Procedure Laterality Date   ABDOMINAL AORTOGRAM W/LOWER EXTREMITY Right 11/10/2021   Procedure: ABDOMINAL AORTOGRAM W/LOWER EXTREMITY;  Surgeon: Waynetta Sandy, MD;  Location: [facility];  Service: Cardiovascular;  Laterality: Right;   AORTA - BILATERAL FEMORAL ARTERY BYPASS GRAFT Bilateral 11/26/2021   Procedure: AORTO- BIILIAC ARTERY  BYPASS GRAFT USING HEMASHIELD 20 X 11MM, RIGHT ILIO-FEMORAL BYPASS  UMBILICAL HERNIA REPAIR;  Surgeon: Cherre Robins, MD;  Location: [facility];  Service: Vascular;  Laterality: Bilateral;   APPLICATION OF WOUND VAC Right 11/26/2021   Procedure: APPLICATION OF WOUND VAC;  Surgeon: Cherre Robins, MD;  Location: [facility];  Service: Vascular;  Laterality: Right;   DIRECT LARYNGOSCOPY Left 10/19/2018   Procedure: DIRECT LARYNGOSCOPY;  Surgeon: Leta Baptist, MD;  Location: [facility];  Service: ENT;  Laterality: Left;   FEMORAL-TIBIAL BYPASS GRAFT Right 11/26/2021   Procedure: RIGHT FEMORALTIBIAL  ARTERY BYPASS GRAFT USING PROPATEN GRAFT;  Surgeon: Cherre Robins, MD;  [facility];  Service: Vascular;  Laterality: Right;   IR IMAGING GUIDED PORT INSERTION  11/04/2018   IR REMOVAL TUN ACCESS W/ PORT W/O FL MOD SED  05/01/2019   TONSILLECTOMY Left 10/19/2018   Procedure: BIOPSY OF LEFT TONSIL;  Surgeon: Leta Baptist, MD;  Location: [facility];  Service: ENT;  Laterality: Left;    HEMATOLOGY/ONCOLOGY HISTORY:  Oncology History  Malignant neoplasm of tonsillar fossa ([place])  10/10/2018 Imaging  CT neck w/ contrast: FINDINGS: Pharynx and larynx: There is asymmetric prominence of the left tonsillar region. No other mucosal or submucosal lesion is seen.   Salivary glands: Parotid and submandibular glands are normal.    Thyroid: Normal   Lymph nodes: Definite adenopathy on the left. 2 level nodes which shows some internal necrosis. The larger measures 2.2 cm in diameter and the smaller measures 1.4 cm in diameter. Suspicious level 4 node image 83 just anterior to the jugular vein measuring 9 mm short axis diameter. No worrisome nodes on the right.   10/10/2018 Imaging   CT chest:  IMPRESSION: 1. Mild right hilar adenopathy, equivocal for metastasis. No additional potential evidence of metastatic disease in the chest. PET-CT could be considered for further staging evaluation, as clinically warranted. 2. 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 Ascending thoracic aortic aneurysm. Recommend semi-annual imaging followup by CTA or MRA and referral to cardiothoracic surgery if not already obtained. This recommendation follows 2010 ACCF/AHA/AATS/ACR/ASA/SCA/SCAI/SIR/STS/SVM Guidelines for the Diagnosis and Management of Patients With Thoracic Aortic Disease. Circulation. 2010; 121: O115-B262. Aortic aneurysm NOS (ICD10-I71.9).   10/17/2018 Initial Diagnosis   Tonsil cancer ([place])   10/18/2018 Imaging   PET: IMPRESSION: 1. 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 2. Local hypermetabolic nodal metastasis to the LEFT level II nodal station. 3. No contralateral nodal metastasis. 4. No evidence distant metastatic disease. 5. Two small pulmonary nodules in the LEFT lower lobe are favored benign. Recommend attention on follow-up. 6. Several small common iliac lymph nodes with low metabolic activity. Favor reactive adenopathy.   10/21/2018 Cancer Staging   Staging form: Pharynx - HPV-Mediated Oropharynx, AJCC 8th Edition - Clinical: Stage I (cT2, cN1, cM0, p16+) - Signed by Eppie Gibson, MD on 10/21/2018   11/10/2018 -  Chemotherapy   The patient had dexamethasone (DECADRON) 4 MG tablet, 1 of 1 cycle, Start date: 10/28/2018, End date: 02/06/2019 palonosetron (ALOXI)  injection 0.25 mg, 0.25 mg, Intravenous,  Once, 6 of 7 cycles Administration: 0.25 mg (11/10/2018), 0.25 mg (11/17/2018), 0.25 mg (12/22/2018), 0.25 mg (11/24/2018), 0.25 mg (12/15/2018), 0.25 mg (12/29/2018) CISplatin (PLATINOL) 100 mg in sodium chloride 0.9 % 500 mL chemo infusion, 40 mg/m2 = 100 mg, Intravenous,  Once, 6 of 7 cycles Dose modification: 30 mg/m2 (original dose 40 mg/m2, Cycle 5, Reason: Dose not tolerated) Administration: 100 mg (11/10/2018), 100 mg (11/17/2018), 75 mg (12/22/2018), 100 mg (11/24/2018), 75 mg (12/15/2018), 75 mg (12/29/2018) fosaprepitant (EMEND) 150 mg, dexamethasone (DECADRON) 12 mg in sodium chloride 0.9 % 145 mL IVPB, , Intravenous,  Once, 6 of 7 cycles Administration:  (11/10/2018),  (11/17/2018),  (12/22/2018),  (11/24/2018),  (12/15/2018),  (12/29/2018)  for chemotherapy treatment.    04/10/2019 Imaging   PET (end-of-treatment) IMPRESSION: 1. Interval decrease in hypermetabolism associated with the left tonsillar region. CT imaging does demonstrate some residual soft tissue fullness in this region. 2. Interval resolution of left-sided level II hypermetabolic nodal metastases. 3. No evidence for hypermetabolic metastatic disease in the chest, abdomen, or pelvis. 4. 2 tiny left lower lobe pulmonary nodules were identified on the previous study. One of these nodules is stable and the other has resolved. Continued attention on follow-up suggested. 5. The small bilateral common iliac nodes seen previously are unchanged, likely reactive 6.  Aortic Atherosclerois (ICD10-170.0) 7.  Emphysema. (MBT59-R41.9)     ALLERGIES:  has No Known Allergies.  MEDICATIONS:  Current Outpatient Medications  Medication Sig Dispense Refill   acetaminophen (TYLENOL) 325 MG tablet Take 2 tablets (  650 mg total) by mouth every 6 (six) hours.     aspirin EC 81 MG EC tablet Take 1 tablet (81 mg total) by mouth daily. Swallow whole. 30 tablet 11   atorvastatin (LIPITOR) 20 MG tablet Take 1 tablet  (20 mg total) by mouth daily. 30 tablet 0   ibuprofen (ADVIL) 200 MG tablet Take 200 mg by mouth every 6 (six) hours as needed.     levothyroxine (SYNTHROID) 25 MCG tablet Take 1 tablet (25 mcg total) by mouth daily before breakfast. Take on empty stomach w/ water, 1 hour before other meds or food. 30 tablet 0   magnesium oxide (MAG-OX) 400 MG tablet Take 0.5 tablets (200 mg total) by mouth at bedtime. (Patient not taking: Reported on 02/26/2022) 30 tablet 0   methocarbamol (ROBAXIN) 500 MG tablet Take 1 tablet (500 mg total) by mouth every 6 (six) hours as needed for muscle spasms. 60 tablet 0   metoprolol succinate (TOPROL-XL) 25 MG 24 hr tablet TAKE 1 TABLET (25 MG TOTAL) BY MOUTH DAILY. 90 tablet 3   oxyCODONE (OXY IR/ROXICODONE) 5 MG immediate release tablet Take 1 tablet (5 mg total) by mouth every 6 (six) hours as needed for severe pain. 90 tablet 0   pantoprazole (PROTONIX) 40 MG tablet Take 1 tablet (40 mg total) by mouth daily with supper. (Patient not taking: Reported on 02/26/2022) 30 tablet 0   polyethylene glycol (MIRALAX / GLYCOLAX) 17 g packet Take 17 g by mouth 2 (two) times daily. 14 each 0   potassium chloride SA (KLOR-CON M) 20 MEQ tablet Take 1 tablet (20 mEq total) by mouth daily. 30 tablet 0   rivaroxaban (XARELTO) 20 MG TABS tablet Take 1 tablet (20 mg total) by mouth daily with supper. 30 tablet 0   No current facility-administered medications for this visit.    VITAL SIGNS: BP 131/89 (BP Location: Right Arm, Patient Position: Sitting)   Pulse (!) 101   Temp 97.6 F (36.4 C) (Oral)   Resp 18   Ht '6\' 3"'$  (1.905 m)   Wt 226 lb 6.4 oz (102.7 kg)   SpO2 98%   BMI 28.30 kg/m  Filed Weights   03/16/22 1413  Weight: 226 lb 6.4 oz (102.7 kg)    Estimated body mass index is 28.3 kg/m as calculated from the following:   Height as of this encounter: '6\' 3"'$  (1.905 m).   Weight as of this encounter: 226 lb 6.4 oz (102.7 kg).  LABS: CBC:    Component Value Date/Time    WBC 5.8 02/26/2022 1406   WBC 7.3 12/08/2021 0637   HGB 15.4 02/26/2022 1406   HGB 18.7 (H) 10/03/2018 1450   HGB 17.8 (H) 05/21/2014 1228   HCT 46.0 02/26/2022 1406   HCT 52.2 (H) 10/03/2018 1450   HCT 50.9 (H) 05/21/2014 1228   PLT 204 02/26/2022 1406   PLT 230 10/03/2018 1450   MCV 87.6 02/26/2022 1406   MCV 89 10/03/2018 1450   MCV 90.9 05/21/2014 1228   NEUTROABS 3.5 02/26/2022 1406   NEUTROABS 5.2 10/03/2018 1450   NEUTROABS 2.6 05/21/2014 1228   LYMPHSABS 1.3 02/26/2022 1406   LYMPHSABS 1.1 10/03/2018 1450   LYMPHSABS 1.8 05/21/2014 1228   MONOABS 0.6 02/26/2022 1406   MONOABS 0.6 05/21/2014 1228   EOSABS 0.3 02/26/2022 1406   EOSABS 0.1 10/03/2018 1450   EOSABS 0.2 10/15/2011 0224   EOSABS 0.1 03/22/2009 1236   BASOSABS 0.0 02/26/2022 1406   BASOSABS 0.0 10/03/2018  1450   BASOSABS 0.0 05/21/2014 1228   Comprehensive Metabolic Panel:    Component Value Date/Time   NA 139 02/26/2022 1406   NA 142 10/03/2018 1450   NA 141 05/21/2014 1226   K 4.1 02/26/2022 1406   K 4.3 05/21/2014 1226   CL 106 02/26/2022 1406   CL 108 (H) 12/28/2012 1405   CO2 27 02/26/2022 1406   CO2 28 05/21/2014 1226   BUN 10 02/26/2022 1406   BUN 15 10/03/2018 1450   BUN 13.2 05/21/2014 1226   CREATININE 0.84 02/26/2022 1406   CREATININE 0.89 08/21/2019 1523   CREATININE 0.9 05/21/2014 1226   GLUCOSE 98 02/26/2022 1406   GLUCOSE 101 05/21/2014 1226   GLUCOSE 95 12/28/2012 1405   CALCIUM 9.6 02/26/2022 1406   CALCIUM 9.3 05/21/2014 1226   AST 14 (L) 02/26/2022 1406   AST 50 (H) 01/08/2014 1203   ALT 10 02/26/2022 1406   ALT 83 (H) 01/08/2014 1203   ALKPHOS 92 02/26/2022 1406   ALKPHOS 87 01/08/2014 1203   BILITOT 0.5 02/26/2022 1406   BILITOT 0.67 01/08/2014 1203   PROT 7.2 02/26/2022 1406   PROT 7.2 10/03/2018 1450   PROT 7.1 01/08/2014 1203   ALBUMIN 4.2 02/26/2022 1406   ALBUMIN 4.3 10/03/2018 1450   ALBUMIN 3.7 01/08/2014 1203    RADIOGRAPHIC STUDIES: No results  found.  PERFORMANCE STATUS (ECOG) : 1 - Symptomatic but completely ambulatory  Review of Systems  HENT:  Positive for dental problem.        Jaw necrosis w/poor dentition   Unless otherwise noted, a complete review of systems is negative.  Physical Exam General: NAD HEENT: thick saliva, jaw necrosis, poor dentition  Cardiovascular: regular rate and rhythm Pulmonary: normal breathing pattern  Abdomen: soft, nontender, + bowel sounds, protruding hernia Extremities: no edema, no joint deformities Skin: no rashes Neurological:AAO x3, mood appropriate   IMPRESSION: This is my initial visit with [name] [name]. No acute distress noted. He presented to clinic by himself today. Alert and able to engage appropriately in discussions.   I introduced myself, [place] RN, and Palliative's role in collaboration with the oncology team. Concept of Palliative Care was introduced as specialized medical care for people and their families living with serious illness.  It focuses on providing relief from the symptoms and stress of a serious illness.  The goal is to improve quality of life for both the patient and the family. Values and goals of care important to patient and family were attempted to be elicited.   [name] [name] lives in the home with his wife of more than 15 years.  He has 2 daughters.  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  He enjoys sports and fishing.  Able to perform all ADLs independently.  Limited in his oral intake due to jaw necrosis and poor dentition.  Complains of increased and thick saliva.  Pain related to radionecrosis Patient reports ongoing jaw pain.  He is unable to open his mouth wide.  [facility].  Is unable to eat certain food items due to pain and necrosis.  Reports his teeth have fallen out due to this.  He is currently taking oxycodone 5 mg every 6 hours as needed for pain, Robaxin as needed for spasms, and Advil.  Feels his pain is  manageable on current regimen.  He does not require oxycodone around-the-clock and takes muscle relaxer 1-2 times per week.  Education provided on current  regimen.  No changes at this time.  We will continue to closely monitor and adjust as needed. Is taking stool softner to prevent constipation.   Increase/thick salivation [name] [name] also complains of increased salivation and thickness of secretions. He reports some improvement when he takes oxycodone as it causes him to have dry mouth.   We discussed use of scopolamine patch to assist in managing secretions. He verbalized understanding and open to trying.   I discussed the importance of continued conversation with family and their medical providers regarding overall plan of care and treatment options, ensuring decisions are within the context of the patients values and GOCs.  PLAN: Established therapeutic relationship. Education provided on palliative's role in collaboration with their Oncology/Radiation team. Oxycodone IR as needed for pain Robaxin as needed Scopalomine patch for excessive secretions  I will plan to see patient back in 4-6 weeks in collaboration to other oncology appointments.    Patient expressed understanding and was in agreement with this plan. He also understands that He can call the clinic at any time with any questions, concerns, or complaints.   Thank you for your referral and allowing Palliative to assist in [name] [name] [facility] care.   Number and complexity of problems addressed: HIGH - 1 or more chronic illnesses with SEVERE exacerbation, progression, or side effects of treatment - advanced cancer, pain. Any controlled substances utilized were prescribed in the context of palliative care.  Time Total: 45 min   Visit consisted of counseling and education dealing with the complex and emotionally intense issues of symptom management and palliative care in the setting of serious and potentially life-threatening  illness.Greater than 50%  of this time was spent counseling and coordinating care related to the above assessment and plan.  Signed by: Alda Lea, AGPCNP-BC Palliative Medicine Team/[facility]

## 2022-03-20 ENCOUNTER — Other Ambulatory Visit: Payer: Self-pay

## 2022-03-20 DIAGNOSIS — M272 Inflammatory conditions of jaws: Secondary | ICD-10-CM | POA: Diagnosis not present

## 2022-03-20 DIAGNOSIS — Z85818 Personal history of malignant neoplasm of other sites of lip, oral cavity, and pharynx: Secondary | ICD-10-CM | POA: Diagnosis not present

## 2022-03-20 DIAGNOSIS — Z923 Personal history of irradiation: Secondary | ICD-10-CM | POA: Diagnosis not present

## 2022-03-27 ENCOUNTER — Other Ambulatory Visit: Payer: Self-pay

## 2022-03-27 MED ORDER — OXYCODONE HCL 5 MG PO TABS: 5.0000 mg | ORAL_TABLET | Freq: Four times a day (QID) | ORAL | 0 refills | Status: DC | PRN

## 2022-03-27 MED ORDER — CYCLOBENZAPRINE HCL 10 MG PO TABS: 10.0000 mg | ORAL_TABLET | Freq: Three times a day (TID) | ORAL | 0 refills | Status: DC | PRN

## 2022-03-27 MED ORDER — SCOPOLAMINE 1 MG/3DAYS TD PT72: 1.0000 | MEDICATED_PATCH | TRANSDERMAL | 12 refills | Status: DC

## 2022-03-27 NOTE — Telephone Encounter (Signed)
Pt significant other called regarding refills for medications. See new orders. This RN educated pt and significant other to take flexeril and to discontinue robaxin, family verbalized understanding. No further questions or concerns at this time.

## 2022-03-30 NOTE — Progress Notes (Deleted)
[facility] [place] HISTORY AND PHYSICAL   History of Present Illness:  Patient is a 61 y.o. year old male who presents for evaluation of PAD.  He has a history of subacute ischemia of the right lower extremity with early tissue loss.  s/p aortobiiliac bypass, right iliofemoral bypass, and right femoral to posterior tibial bypass with umbilical hernia repair by Dr. [name] and Dr. Carlis Abbott on 11/26/2021.  This was performed due to AAA, right iliac aneurysm, occluded right common femoral aneurysm, and occluded popliteal aneurysm.    He denies rest pain, non healing wounds or claudication.  He has had palpable right PT pulse on bilateral LE.         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[place]) 05/25/2017   Right SFA 05/07/17 while on warfarin INR 2.9   Atrial fibrillation ([facility])    Benign essential HTN 01/04/2012   Chronic anticoagulation 01/02/2013   Dermatofibroma of forearm 01/02/2013   Left side   DVT, lower extremity, proximal ([place]) 05/24/2013   Right leg femoral & popliteal 05/24/13   Hyperlipidemia, mixed 01/04/2012   Hypothyroidism    Pneumonia    Polycythemia secondary to smoking 01/04/2012   Primary hypercoagulable state ([place]) 10/01/2014   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[facility])    Sinus bradycardia, chronic 01/04/2012   Superficial thrombosis of lower extremity 05/02/2012    Past Surgical History:  Procedure Laterality Date   ABDOMINAL AORTOGRAM W/LOWER EXTREMITY Right 11/10/2021   Procedure: ABDOMINAL AORTOGRAM W/LOWER EXTREMITY;  Surgeon: Waynetta Sandy, MD;  Location: [facility];  Service: Cardiovascular;  Laterality: Right;   AORTA - BILATERAL FEMORAL ARTERY BYPASS GRAFT  Bilateral 11/26/2021   Procedure: AORTO- BIILIAC ARTERY  BYPASS GRAFT USING HEMASHIELD 20 X 11MM, RIGHT ILIO-FEMORAL BYPASS  UMBILICAL HERNIA REPAIR;  Surgeon: Cherre Robins, MD;  Location: [facility];  Service: Vascular;  Laterality: Bilateral;   APPLICATION OF WOUND VAC Right 11/26/2021   Procedure: APPLICATION OF WOUND VAC;  Surgeon: Cherre Robins, MD;  Location: [facility];  Service: Vascular;  Laterality: Right;   DIRECT LARYNGOSCOPY Left 10/19/2018   Procedure: DIRECT LARYNGOSCOPY;  Surgeon: Leta Baptist, MD;  Location: [facility];  Service: ENT;  Laterality: Left;   FEMORAL-TIBIAL BYPASS GRAFT Right 11/26/2021   Procedure: RIGHT FEMORALTIBIAL  ARTERY BYPASS GRAFT USING PROPATEN GRAFT;  Surgeon: Cherre Robins, MD;  [facility];  Service: Vascular;  Laterality: Right;   IR IMAGING GUIDED PORT INSERTION  11/04/2018   IR REMOVAL TUN ACCESS W/ PORT W/O FL MOD SED  05/01/2019   TONSILLECTOMY Left 10/19/2018   Procedure: BIOPSY OF LEFT TONSIL;  Surgeon: Leta Baptist, MD;  Location: [facility];  Service: ENT;  Laterality: Left;    ROS:   General:  No weight loss, Fever, chills  HEENT: No recent headaches, no nasal bleeding, no visual changes, no sore throat  Neurologic: No dizziness, blackouts, seizures. No recent symptoms of stroke or mini- stroke. No recent episodes of slurred speech, or temporary blindness.  Cardiac: No recent episodes of chest pain/pressure, no shortness of breath at rest.  No shortness of breath with exertion.  Denies history of atrial fibrillation or irregular heartbeat  Vascular: No  history of rest pain in feet.  No history of claudication.  No history of non-healing ulcer, No history of DVT   Pulmonary: No home oxygen, no productive cough, no hemoptysis,  No asthma or wheezing  Musculoskeletal:  '[ ]'$  Arthritis, '[ ]'$  Low back pain,  '[ ]'$  Joint pain  Hematologic:No history of hypercoagulable state.  No history of easy bleeding.  No history of  anemia  Gastrointestinal: No hematochezia or melena,  No gastroesophageal reflux, no trouble swallowing  Urinary: '[ ]'$  chronic Kidney disease, '[ ]'$  on HD - '[ ]'$  MWF or '[ ]'$  TTHS, '[ ]'$  Burning with urination, '[ ]'$  Frequent urination, '[ ]'$  Difficulty urinating;   Skin: No rashes  Psychological: No history of anxiety,  No history of depression  Social History Social History   Tobacco Use   Smoking status: Light Smoker    Packs/day: 0.50    Years: 30.00    Total pack years: 15.00    Types: Cigarettes    Last attempt to quit: 10/19/2018    Years since quitting: 3.4   Smokeless tobacco: Never  Vaping Use   Vaping Use: Former   Substances: Nicotine, Flavoring   Devices: 1 cartridge q2-3 days, he tells me he is not using.   Substance Use Topics   Alcohol use: Not Currently    Comment: 6 drinks per week, per patient   Drug use: Yes    Types: Marijuana    Family History Family History  Problem Relation Age of Onset   Stroke Father     Allergies  No Known Allergies   Current Outpatient Medications  Medication Sig Dispense Refill   acetaminophen (TYLENOL) 325 MG tablet Take 2 tablets (650 mg total) by mouth every 6 (six) hours.     aspirin EC 81 MG EC tablet Take 1 tablet (81 mg total) by mouth daily. Swallow whole. 30 tablet 11   atorvastatin (LIPITOR) 20 MG tablet Take 1 tablet (20 mg total) by mouth daily. 30 tablet 0   cyclobenzaprine (FLEXERIL) 10 MG tablet Take 1 tablet (10 mg total) by mouth 3 (three) times daily as needed for muscle spasms. 90 tablet 0   ibuprofen (ADVIL) 200 MG tablet Take 200 mg by mouth every 6 (six) hours as needed.     levothyroxine (SYNTHROID) 25 MCG tablet Take 1 tablet (25 mcg total) by mouth daily before breakfast. Take on empty stomach w/ water, 1 hour before other meds or food. 30 tablet 0   magnesium oxide (MAG-OX) 400 MG tablet Take 0.5 tablets (200 mg total) by mouth at bedtime. (Patient not taking: Reported on 02/26/2022) 30 tablet 0   metoprolol  succinate (TOPROL-XL) 25 MG 24 hr tablet TAKE 1 TABLET (25 MG TOTAL) BY MOUTH DAILY. 90 tablet 3   oxyCODONE (OXY IR/ROXICODONE) 5 MG immediate release tablet Take 1 tablet (5 mg total) by mouth every 6 (six) hours as needed for severe pain. 90 tablet 0   pantoprazole (PROTONIX) 40 MG tablet Take 1 tablet (40 mg total) by mouth daily with supper. (Patient not taking: Reported on 02/26/2022) 30 tablet 0   polyethylene glycol (MIRALAX / GLYCOLAX) 17 g packet Take 17 g by mouth 2 (two) times daily. 14 each 0   potassium chloride SA (KLOR-CON M) 20 MEQ tablet Take 1 tablet (20 mEq total) by mouth daily. 30 tablet 0   rivaroxaban (XARELTO) 20 MG TABS tablet Take 1 tablet (20 mg total) by mouth daily with supper. 30 tablet 0  scopolamine (TRANSDERM-SCOP) 1 MG/3DAYS Place 1 patch (1.5 mg total) onto the skin every 3 (three) days. 10 patch 12   No current facility-administered medications for this visit.    Physical Examination  There were no vitals filed for this visit.  There is no height or weight on file to calculate BMI.  General:  Alert and oriented, no acute distress HEENT: Normal Neck: No bruit or JVD Pulmonary: Clear to auscultation bilaterally Cardiac: Regular Rate and Rhythm without murmur Abdomen: Soft, non-tender, non-distended, no mass, no scars Skin: No rash Extremity Pulses:  2+ radial, brachial, femoral, dorsalis pedis, posterior tibial pulses bilaterally Musculoskeletal: No deformity or edema  Neurologic: Upper and lower extremity motor 5/5 and symmetric  DATA: ***   ASSESSMENT: ***   PLAN: ***   Roxy Horseman PA-C [facility] [place] Office: [phone]  MD in clinic [place]

## 2022-03-30 NOTE — Progress Notes (Signed)
Fax received for medical clearance/medication hold for Dr. [name].  Provider signed, form faxed back to sender, verified successful.

## 2022-03-31 ENCOUNTER — Ambulatory Visit: Payer: Medicaid Other

## 2022-03-31 ENCOUNTER — Ambulatory Visit (HOSPITAL_COMMUNITY)
Admission: RE | Admit: 2022-03-31 | Discharge: 2022-03-31 | Disposition: A | Discharge status: Home / Self Care | Payer: Medicaid Other | Source: Ambulatory Visit | Attending: Vascular Surgery | Admitting: Vascular Surgery

## 2022-03-31 DIAGNOSIS — I70221 Atherosclerosis of native arteries of extremities with rest pain, right leg: Secondary | ICD-10-CM

## 2022-03-31 DIAGNOSIS — I739 Peripheral vascular disease, unspecified: Secondary | ICD-10-CM

## 2022-04-21 ENCOUNTER — Ambulatory Visit (HOSPITAL_COMMUNITY)
Admission: RE | Admit: 2022-04-21 | Discharge: 2022-04-21 | Disposition: A | Discharge status: Home / Self Care | Payer: Medicaid Other | Source: Ambulatory Visit | Attending: Vascular Surgery | Admitting: Vascular Surgery

## 2022-04-21 ENCOUNTER — Ambulatory Visit (INDEPENDENT_AMBULATORY_CARE_PROVIDER_SITE_OTHER)
Admission: RE | Admit: 2022-04-21 | Discharge: 2022-04-21 | Disposition: A | Discharge status: Home / Self Care | Payer: Medicaid Other | Source: Ambulatory Visit | Attending: Vascular Surgery | Admitting: Vascular Surgery

## 2022-04-21 ENCOUNTER — Ambulatory Visit: Payer: Medicaid Other | Admitting: Physician Assistant

## 2022-04-21 VITALS — BP 113/84 | HR 80 | Temp 97.6°F | Ht 72.0 in | Wt 228.0 lb

## 2022-04-21 DIAGNOSIS — I70221 Atherosclerosis of native arteries of extremities with rest pain, right leg: Secondary | ICD-10-CM | POA: Diagnosis not present

## 2022-04-21 DIAGNOSIS — I739 Peripheral vascular disease, unspecified: Secondary | ICD-10-CM | POA: Insufficient documentation

## 2022-04-21 NOTE — Progress Notes (Signed)
[facility] OF  HISTORY AND PHYSICAL   History of Present Illness:  Patient is a 61 y.o. year old male who presents for evaluation of PAD mixed with aneurysmal disease and right GT tissue loss.  On 11/26/21 he under went  PROCEDURE:    1) aorto-bi-iliac bypass (20x82m Dacron) 2) right ilio-femoral bypass (824mDacron) 3) right femoral - posterior tibial artery bypass (35m135m4) umbilical hernia repair  He states he has no claudication symptoms with ambulation on the right LE, no new non healing wounds. And no rest pain that wakes him from his sleep.  The GT wound has fully healed.  He did have one episode of pain with edema.  The pain came in went within a few days.  He has edema in B LE with right LE dependent bluish skin changes.    He is managed on Xarelto and is followed by a Hematologist for history of recurrent DVT.  He also has history of A fib.  He has been managed on Coumadin, Eliquis and now Xarelto.       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[facility]/12[place]2016   Right SFA 05/07/17 while on warfarin INR 2.9   Atrial fibrillation (HCC)    Benign essential HTN 01/04/2012   Chronic anticoagulation 01/02/2013   Dermatofibroma of forearm 01/02/2013   Left side   DVT, lower extremity, proximal ([facility] [place]/11/2012   Right leg femoral & popliteal 05/24/13   Hyperlipidemia, mixed 01/04/2012   Hypothyroidism    Pneumonia    Polycythemia secondary to smoking 01/04/2012   Primary hypercoagulable state ([place]3/14/2016   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HCC)    Sinus bradycardia, chronic 01/04/2012   Superficial thrombosis of lower extremity 05/02/2012    Past Surgical History:  Procedure  Laterality Date   ABDOMINAL AORTOGRAM W/LOWER EXTREMITY Right 11/10/2021   Procedure: ABDOMINAL AORTOGRAM W/LOWER EXTREMITY;  Surgeon: CaiWaynetta SandyD;  [facility];  Service: Cardiovascular;  Laterality: Right;   AORTA - BILATERAL FEMORAL ARTERY BYPASS GRAFT Bilateral 11/26/2021   Procedure: AORTO- BIILIAC ARTERY  BYPASS GRAFT USING HEMASHIELD 20 X 11MM, RIGHT ILIO-FEMORAL BYPASS  UMBILICAL HERNIA REPAIR;  Surgeon: HawCherre RobinsD;  [facility];  Laterality: Bilateral;   APPLICATION OF WOUND VAC Right 11/26/2021   Procedure: APPLICATION OF WOUND VAC;  Surgeon: HawCherre RobinsD;  [facility];  Laterality: Right;   DIRECT LARYNGOSCOPY Left 10/19/2018   Procedure: DIRECT LARYNGOSCOPY;  Surgeon: TeoLeta BaptistD;  [facility];  Laterality: Left;   FEMORAL-TIBIAL BYPASS GRAFT Right 11/26/2021   Procedure: RIGHT FEMORALTIBIAL  ARTERY BYPASS GRAFT USING PROPATEN GRAFT;  Surgeon: HawCherre RobinsD;  [facility];  Service: Vascular;  Laterality: Right;   IR IMAGING GUIDED PORT INSERTION  11/04/2018   IR REMOVAL TUN ACCESS W/ PORT W/O FL MOD SED  05/01/2019   TONSILLECTOMY Left 10/19/2018   Procedure: BIOPSY OF LEFT TONSIL;  Surgeon: TeoLeta BaptistD;  [facility];  Laterality: Left;    ROS:   General:  No weight loss, Fever, chills  HEENT: No recent headaches,  no nasal bleeding, no visual changes, no sore throat  Neurologic: No dizziness, blackouts, seizures. No recent symptoms of stroke or mini- stroke. No recent episodes of slurred speech, or temporary blindness.  Cardiac: No recent episodes of chest pain/pressure, no shortness of breath at rest.  No shortness of breath with exertion.  Denies history of atrial fibrillation or irregular heartbeat  Vascular: No history of rest pain in feet.  No history of claudication.  No history of  non-healing ulcer, No history of DVT   Pulmonary: No home oxygen, no productive cough, no hemoptysis,  No asthma or wheezing  Musculoskeletal:  '[ ]'$  Arthritis, '[ ]'$  Low back pain,  '[ ]'$  Joint pain  Hematologic:No history of hypercoagulable state.  No history of easy bleeding.  No history of anemia  Gastrointestinal: No hematochezia or melena,  No gastroesophageal reflux, no trouble swallowing  Urinary: '[ ]'$  chronic Kidney disease, '[ ]'$  on HD - '[ ]'$  MWF or '[ ]'$  TTHS, '[ ]'$  Burning with urination, '[ ]'$  Frequent urination, '[ ]'$  Difficulty urinating;   Skin: No rashes  Psychological: No history of anxiety,  No history of depression  Social History Social History   Tobacco Use   Smoking status: Light Smoker    Packs/day: 0.25    Years: 30.00    Total pack years: 7.50    Types: Cigarettes    Last attempt to quit: 10/19/2018    Years since quitting: 3.5   Smokeless tobacco: Never  Vaping Use   Vaping Use: Former   Substances: Nicotine, Flavoring   Devices: 1 cartridge q2-3 days, he tells me he is not using.   Substance Use Topics   Alcohol use: Not Currently    Comment: 6 drinks per week, per patient   Drug use: Yes    Types: Marijuana    Family History Family History  Problem Relation Age of Onset   Stroke Father     Allergies  No Known Allergies   Current Outpatient Medications  Medication Sig Dispense Refill   acetaminophen (TYLENOL) 325 MG tablet Take 2 tablets (650 mg total) by mouth every 6 (six) hours.     aspirin EC 81 MG EC tablet Take 1 tablet (81 mg total) by mouth daily. Swallow whole. 30 tablet 11   atorvastatin (LIPITOR) 20 MG tablet Take 1 tablet (20 mg total) by mouth daily. 30 tablet 0   cyclobenzaprine (FLEXERIL) 10 MG tablet Take 1 tablet (10 mg total) by mouth 3 (three) times daily as needed for muscle spasms. 90 tablet 0   ibuprofen (ADVIL) 200 MG tablet Take 200 mg by mouth every 6 (six) hours as needed.     levothyroxine (SYNTHROID) 25 MCG tablet Take 1  tablet (25 mcg total) by mouth daily before breakfast. Take on empty stomach w/ water, 1 hour before other meds or food. 30 tablet 0   metoprolol succinate (TOPROL-XL) 25 MG 24 hr tablet TAKE 1 TABLET (25 MG TOTAL) BY MOUTH DAILY. 90 tablet 3   oxyCODONE (OXY IR/ROXICODONE) 5 MG immediate release tablet Take 1 tablet (5 mg total) by mouth every 6 (six) hours as needed for severe pain. 90 tablet 0   polyethylene glycol (MIRALAX / GLYCOLAX) 17 g packet Take 17 g by mouth 2 (two) times daily. 14 each 0   rivaroxaban (XARELTO) 20 MG TABS tablet Take 1 tablet (20 mg total) by mouth daily with supper. 30 tablet 0   scopolamine (TRANSDERM-SCOP) 1 MG/3DAYS Place 1 patch (1.5 mg total)  onto the skin every 3 (three) days. 10 patch 12   No current facility-administered medications for this visit.    Physical Examination  Vitals:   04/21/22 1318  BP: 113/84  Pulse: 80  Temp: 97.6 F (36.4 C)  TempSrc: Temporal  SpO2: 97%  Weight: 228 lb (103.4 kg)  Height: 6' (1.829 m)    Body mass index is 30.92 kg/m.  General:  Alert and oriented, no acute distress HEENT: Normal Neck: No bruit or JVD Pulmonary: Clear to auscultation bilaterally Cardiac: Regular Rate and Rhythm without murmur Abdomen: Soft, non-tender, non-distended, palpable periumbilical hernia that is soft and reducible, no scars Skin: No rash, dependent bluish skin changes right foot.  No open wounds. Extremity Pulses:  2+  femoral, right doppler signal monophasic dorsalis pedis, left palpable DP.   GT wound has fully healed Musculoskeletal: B LE edema  Neurologic: Upper and lower extremity motor  and symmetric  DATA:  +--------+--------+-----+--------+---------+--------+  RIGHT   PSV cm/sRatioStenosisWaveform Comments  +--------+--------+-----+--------+---------+--------+  CFA Prox55                   triphasic          +--------+--------+-----+--------+---------+--------+        Right Graft #1:   +------------------+--------+--------+---------+--------+                    PSV cm/sStenosisWaveform Comments  +------------------+--------+--------+---------+--------+  Inflow            48              triphasic          +------------------+--------+--------+---------+--------+  Prox Anastomosis  69              triphasic          +------------------+--------+--------+---------+--------+  Proximal Graft            occluded                   +------------------+--------+--------+---------+--------+  Mid Graft                 occluded                   +------------------+--------+--------+---------+--------+  Distal Graft              occluded                   +------------------+--------+--------+---------+--------+  Distal Anastomosis        occluded                   +------------------+--------+--------+---------+--------+  Outflow                   occluded                   +------------------+--------+--------+---------+--------+      Summary:  Right: Occluded right femoral to PTA bypass graft.    ABI Findings:  +---------+------------------+-----+-------------------+------------------+   Right    Rt Pressure (mmHg)IndexWaveform           Comment              +---------+------------------+-----+-------------------+------------------+   Brachial 122                                                             +---------+------------------+-----+-------------------+------------------+  PTA      63                0.52 dampened monophasic                      +---------+------------------+-----+-------------------+------------------+   DP       0                      Absent             unable to  insonate  +---------+------------------+-----+-------------------+------------------+   Great Toe                       Absent                                    +---------+------------------+-----+-------------------+------------------+    +---------+------------------+-----+---------+-------+  Left     Lt Pressure (mmHg)IndexWaveform Comment  +---------+------------------+-----+---------+-------+  Brachial 120                                      +---------+------------------+-----+---------+-------+  PTA      148               1.21 biphasic          +---------+------------------+-----+---------+-------+  DP       163               1.34 triphasic         +---------+------------------+-----+---------+-------+  [place]               0.96 Normal            +---------+------------------+-----+---------+-------+         Summary:  Right: Resting right ankle-brachial index indicates moderate right lower  extremity arterial disease.   Left: Resting left ankle-brachial index is within normal range. The left  toe-brachial index is normal.    ASSESSMENT:  PAD mixed with aneurysmal disease Now s/p   PROCEDURE:   1) aorto-bi-iliac bypass (20x45m Dacron) 2) right ilio-femoral bypass (848mDacron) 3) right femoral - posterior tibial artery bypass (22m48m4) umbilical hernia repair  He states that he had increased pain with edema about a month ago.  The pain went away and he denise claudication, rest or or new non healing wounds.  On bypass right LE duplex the bypass is occluded.  His right toe wound has fully healed and on ABI' s he has 0.6 %index in the PTA via collaterals.  Unless he has ischemic symptoms I have encouraged him to walk for exercise and elevation when at rest for edema.   The left ABI is normal with palpable DP pulse.    PLAN: He will f/u in 6 months for surveillance ABI's.  If develops ischemic changes he will call our office.  The patient was seen in conjunction with DR. Hawken.  Since    EmmRoxy Horseman-C [facility] [place]: [phone] in  clinic [place]

## 2022-04-22 ENCOUNTER — Telehealth: Payer: Self-pay | Admitting: *Deleted

## 2022-04-22 ENCOUNTER — Other Ambulatory Visit: Payer: Self-pay

## 2022-04-22 DIAGNOSIS — I739 Peripheral vascular disease, unspecified: Secondary | ICD-10-CM

## 2022-04-22 NOTE — Telephone Encounter (Signed)
Received call from pt . He states he had an appt with Vascular Surgeon's office yesterday. He was told he had partial occlusion in LLE, without ischemic changes.  He has f/u in 6 months with them. He called her to see if there was any other anticoagulant he should be on. Currently he is on 1 baby aspirin a day and Xarelto. Advised I would pass this information on to Dr. Lorenso Courier. He vocied understanding.

## 2022-04-29 ENCOUNTER — Inpatient Hospital Stay: Payer: Medicaid Other | Attending: Hematology and Oncology | Admitting: Nurse Practitioner

## 2022-04-29 ENCOUNTER — Encounter: Payer: Self-pay | Admitting: Nurse Practitioner

## 2022-04-29 DIAGNOSIS — L598 Other specified disorders of the skin and subcutaneous tissue related to radiation: Secondary | ICD-10-CM

## 2022-04-29 DIAGNOSIS — R530 Neoplastic (malignant) related fatigue: Secondary | ICD-10-CM | POA: Diagnosis not present

## 2022-04-29 DIAGNOSIS — Y842 Radiological procedure and radiotherapy as the cause of abnormal reaction of the patient, or of later complication, without mention of misadventure at the time of the procedure: Secondary | ICD-10-CM

## 2022-04-29 DIAGNOSIS — G893 Neoplasm related pain (acute) (chronic): Secondary | ICD-10-CM | POA: Diagnosis not present

## 2022-04-29 DIAGNOSIS — Z515 Encounter for palliative care: Secondary | ICD-10-CM

## 2022-04-29 DIAGNOSIS — G4701 Insomnia due to medical condition: Secondary | ICD-10-CM | POA: Diagnosis not present

## 2022-04-29 MED ORDER — OXYCODONE HCL 5 MG PO TABS: 5.0000 mg | ORAL_TABLET | Freq: Four times a day (QID) | ORAL | 0 refills | Status: DC | PRN

## 2022-04-29 MED ORDER — LORAZEPAM 0.5 MG PO TABS: 0.5000 mg | ORAL_TABLET | Freq: Every day | ORAL | 0 refills | Status: DC

## 2022-04-29 NOTE — Progress Notes (Signed)
[place]  [phone] [phone]) [phone]   Name: [name] [name] Date: 04/29/2022 MRN: [id number]  DOB: May 26, 1961  Patient Care Team: System, Provider Not In as PCP - General Beryle Beams Alyson Locket, MD as Consulting Physician (Oncology) Leta Baptist, MD as Consulting Physician (Otolaryngology) Eppie Gibson, MD as Attending Physician (Radiation Oncology) Tish Men, MD (Inactive) as Consulting Physician (Hematology) Leota Sauers, RN (Inactive) as Oncology Nurse Navigator Pickenpack-Cousar, Carlena Sax, NP as Nurse Practitioner (Nurse Practitioner)   I connected with [name] on 04/29/22 at  2:00 PM EDT by phone and verified that I am speaking with the correct person using two identifiers.   I discussed the limitations, risks, security and privacy concerns of performing an evaluation and management service by telemedicine and the availability of in-person appointments. I also discussed with the patient that there may be a patient responsible charge related to this service. The patient expressed understanding and agreed to proceed.   Other persons participating in the visit and their role in the encounter:    Patient's location: Home  Provider's location: [place] HISTORY: ANKITH [name] is a 61 y.o. 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 Palliative ask to see for symptom management.    SOCIAL HISTORY:     reports that he has been smoking cigarettes. He has a 7.50 pack-year smoking history. He has never used smokeless tobacco. He reports that he does not currently use alcohol. He reports current drug use. Drug: Marijuana.  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[place]) 05/25/2017   Right SFA 05/07/17 while on warfarin INR 2.9   Atrial fibrillation ([place])    Benign essential HTN 01/04/2012   Chronic anticoagulation 01/02/2013   Dermatofibroma of forearm 01/02/2013   Left side   DVT, lower extremity, proximal ([place]) 05/24/2013   Right leg femoral & popliteal 05/24/13   Hyperlipidemia, mixed 01/04/2012   Hypothyroidism    Pneumonia    Polycythemia secondary to smoking 01/04/2012   Primary hypercoagulable state ([place]) 10/01/2014   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HCC)    Sinus bradycardia, chronic 01/04/2012   Superficial thrombosis of lower extremity 05/02/2012    ALLERGIES:  has No Known Allergies.  MEDICATIONS:  Current Outpatient Medications  Medication Sig Dispense Refill   LORazepam (ATIVAN) 0.5 MG tablet Take 1 tablet (0.5 mg total) by mouth at bedtime. 30 tablet 0   acetaminophen (TYLENOL) 325 MG tablet Take 2 tablets (650 mg total) by mouth every 6 (six) hours.     aspirin EC 81 MG EC tablet Take 1 tablet (81 mg total) by mouth daily. Swallow whole. 30 tablet 11   atorvastatin (LIPITOR) 20 MG tablet Take 1 tablet (20 mg total) by mouth daily. 30 tablet 0   cyclobenzaprine (FLEXERIL) 10 MG tablet Take 1 tablet (10 mg total) by mouth 3 (three) times daily as needed for muscle spasms. 90 tablet 0   ibuprofen (ADVIL) 200 MG tablet Take 200 mg by mouth every 6 (six) hours as needed.     levothyroxine (  SYNTHROID) 25 MCG tablet Take 1 tablet (25 mcg total) by mouth daily before breakfast. Take on empty stomach w/ water, 1 hour before other meds or food. 30 tablet 0   metoprolol succinate (TOPROL-XL) 25 MG 24 hr tablet TAKE 1 TABLET (25 MG TOTAL) BY MOUTH DAILY. 90 tablet 3   oxyCODONE (OXY IR/ROXICODONE) 5 MG immediate release tablet Take 1 tablet (5 mg total) by mouth  every 6 (six) hours as needed for severe pain. 90 tablet 0   polyethylene glycol (MIRALAX / GLYCOLAX) 17 g packet Take 17 g by mouth 2 (two) times daily. 14 each 0   rivaroxaban (XARELTO) 20 MG TABS tablet Take 1 tablet (20 mg total) by mouth daily with supper. 30 tablet 0   scopolamine (TRANSDERM-SCOP) 1 MG/3DAYS Place 1 patch (1.5 mg total) onto the skin every 3 (three) days. 10 patch 12   No current facility-administered medications for this visit.    VITAL SIGNS: There were no vitals taken for this visit. There were no vitals filed for this visit.  Estimated body mass index is 30.92 kg/m as calculated from the following:   Height as of 04/21/22: 6' (1.829 m).   Weight as of 04/21/22: 228 lb (103.4 kg).   PERFORMANCE STATUS (ECOG) : 1 - Symptomatic but completely ambulatory   IMPRESSION: I connected with Mr. [name] [name] by phone.  No acute distress identified.  States he is doing as well as possible.  He has an upcoming appointment in the next month with [facility] which he is hopeful that they may have further options to assist with his current condition.  Continues to have thick secretions.  Does not feel that the scopolamine patch offered any support.  We will continue to closely monitor and support as needed.  Could potentially consider Robinul.  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  Continues to be unable to open his mouth wide or eat certain foods due to limitations.  He does endorse current medications provides some relief.  He is currently taking oxycodone 5 mg every 6 hours as needed for pain, Advil in between, and Robaxin as needed for spasms.  We will continue current regimen with close follow-up.  Denies any concerns with constipation.  Insomnia [name] expresses his ongoing fatigue and inability to get a good nights rest due to pain and secretions.  He has been taking trazodone 100 mg which has not been effective.  Will often wake up every hour and have difficulty falling back  asleep.  Education provided on lorazepam at bedtime.  We will continue to closely monitor and support.  I discussed the importance of continued conversation with family and their medical providers regarding overall plan of care and treatment options, ensuring decisions are within the context of the patients values and GOCs.  PLAN: Oxycodone as needed for pain Robaxin as needed for muscle spasms Scopolamine patch for excessive secretions.  Does not feel that this is allow for much improvement.  We will continue to closely monitor.  Could consider Robinul. Lorazepam 0.5 mg at bedtime as needed for insomnia. We will continue to closely monitor and assist with symptom management. I will plan to see patient back in 4 to 6 weeks.  Patient expressed understanding and was in agreement with this plan. He also understands that He can call the clinic at any time with any questions, concerns, or complaints.   Any controlled substances utilized were prescribed in the context of palliative care. PDMP has been reviewed.  Time Total: 40 min   Visit consisted of counseling and education dealing with the complex and emotionally intense issues of symptom management and palliative care in the setting of serious and potentially life-threatening illness.Greater than 50%  of this time was spent counseling and coordinating care related to the above assessment and plan.  Alda Lea, AGPCNP-BC  Palliative Medicine Team/[place]

## 2022-05-06 DIAGNOSIS — M272 Inflammatory conditions of jaws: Secondary | ICD-10-CM | POA: Diagnosis not present

## 2022-05-06 DIAGNOSIS — I743 Embolism and thrombosis of arteries of the lower extremities: Secondary | ICD-10-CM | POA: Diagnosis not present

## 2022-05-26 ENCOUNTER — Other Ambulatory Visit (HOSPITAL_COMMUNITY): Payer: Self-pay

## 2022-06-03 ENCOUNTER — Other Ambulatory Visit: Payer: Self-pay

## 2022-06-03 DIAGNOSIS — Y842 Radiological procedure and radiotherapy as the cause of abnormal reaction of the patient, or of later complication, without mention of misadventure at the time of the procedure: Secondary | ICD-10-CM

## 2022-06-03 DIAGNOSIS — Z515 Encounter for palliative care: Secondary | ICD-10-CM

## 2022-06-03 DIAGNOSIS — G4701 Insomnia due to medical condition: Secondary | ICD-10-CM

## 2022-06-03 DIAGNOSIS — G893 Neoplasm related pain (acute) (chronic): Secondary | ICD-10-CM

## 2022-06-03 MED ORDER — OXYCODONE HCL 5 MG PO TABS: 5.0000 mg | ORAL_TABLET | Freq: Four times a day (QID) | ORAL | 0 refills | Status: DC | PRN

## 2022-06-03 MED ORDER — PANTOPRAZOLE SODIUM 40 MG PO TBEC
40.0000 mg | DELAYED_RELEASE_TABLET | Freq: Two times a day (BID) | ORAL | 1 refills | Status: AC
Start: 2022-06-03 — End: ?

## 2022-06-03 NOTE — Telephone Encounter (Signed)
Pt called reporting that he was out of pain meds and was having some heartburn, oxycodone and protonix refilled. No further needs at this time.

## 2022-06-19 DIAGNOSIS — K432 Incisional hernia without obstruction or gangrene: Secondary | ICD-10-CM | POA: Diagnosis not present

## 2022-07-06 ENCOUNTER — Inpatient Hospital Stay: Payer: Medicaid Other | Attending: Hematology and Oncology | Admitting: Nurse Practitioner

## 2022-07-06 ENCOUNTER — Encounter: Payer: Self-pay | Admitting: Nurse Practitioner

## 2022-07-06 DIAGNOSIS — L598 Other specified disorders of the skin and subcutaneous tissue related to radiation: Secondary | ICD-10-CM

## 2022-07-06 DIAGNOSIS — Z515 Encounter for palliative care: Secondary | ICD-10-CM | POA: Diagnosis not present

## 2022-07-06 DIAGNOSIS — G4701 Insomnia due to medical condition: Secondary | ICD-10-CM | POA: Diagnosis not present

## 2022-07-06 DIAGNOSIS — Y842 Radiological procedure and radiotherapy as the cause of abnormal reaction of the patient, or of later complication, without mention of misadventure at the time of the procedure: Secondary | ICD-10-CM | POA: Diagnosis not present

## 2022-07-06 DIAGNOSIS — G893 Neoplasm related pain (acute) (chronic): Secondary | ICD-10-CM | POA: Diagnosis not present

## 2022-07-06 MED ORDER — CYCLOBENZAPRINE HCL 10 MG PO TABS: 10.0000 mg | ORAL_TABLET | Freq: Three times a day (TID) | ORAL | 0 refills | Status: DC | PRN

## 2022-07-06 MED ORDER — OXYCODONE HCL 5 MG PO TABS: 5.0000 mg | ORAL_TABLET | Freq: Four times a day (QID) | ORAL | 0 refills | Status: DC | PRN

## 2022-07-06 NOTE — Progress Notes (Signed)
[place]  [phone] Fax:(336) [phone]   Name: [name] [name] Date: 07/06/2022 MRN: [id number]  DOB: 09/23/1960  Patient Care Team: System, Provider Not In as PCP - General [name] Beams Alyson Locket, MD as Consulting Physician (Oncology) [name] Baptist, MD as Consulting Physician (Otolaryngology) [name] Gibson, MD as Attending Physician (Radiation Oncology) [name] Men, MD (Inactive) as Consulting Physician (Hematology) [name] Sauers, RN (Inactive) as Oncology Nurse Navigator Pickenpack-Cousar, [name] Sax, NP as Nurse Practitioner (Nurse Practitioner)   I connected with [name] [name] on 07/06/22 at 11:00 AM EST by phone and verified that I am speaking with the correct person using two identifiers.   I discussed the limitations, risks, security and privacy concerns of performing an evaluation and management service by telemedicine and the availability of in-person appointments. I also discussed with the patient that there may be a patient responsible charge related to this service. The patient expressed understanding and agreed to proceed.   Other persons participating in the visit and their role in the encounter:    Patient's location: Home  Provider's location: [place] HISTORY: [name] [name] is a 61 y.o. 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 Palliative ask to see for symptom management.    SOCIAL HISTORY:     reports that he has been smoking cigarettes. He has a 7.50 pack-year smoking history. He has never used smokeless tobacco. He reports that he does not currently use alcohol. He reports current drug use. Drug: Marijuana.  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   Right SFA 05/07/17 while on warfarin INR 2.9   Atrial fibrillation ([place])    Benign essential HTN 01/04/2012   Chronic anticoagulation 01/02/2013   Dermatofibroma of forearm 01/02/2013   Left side   DVT, lower extremity, proximal ([place]) 05/24/2013   Right leg femoral & popliteal 05/24/13   Hyperlipidemia, mixed 01/04/2012   Hypothyroidism    Pneumonia    Polycythemia secondary to smoking 01/04/2012   Primary hypercoagulable state ([place]) 10/01/2014   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[facility])    Sinus bradycardia, chronic 01/04/2012   Superficial thrombosis of lower extremity 05/02/2012    ALLERGIES:  has No Known Allergies.  MEDICATIONS:  Current Outpatient Medications  Medication Sig Dispense Refill   acetaminophen (TYLENOL) 325 MG tablet Take 2 tablets (650 mg total) by mouth every 6 (six) hours.     aspirin EC 81 MG EC tablet Take 1 tablet (81 mg total) by mouth daily. Swallow whole. 30 tablet 11   atorvastatin (LIPITOR) 20 MG tablet Take 1 tablet (20 mg total) by mouth daily. 30 tablet 0   cyclobenzaprine (FLEXERIL) 10 MG tablet Take 1 tablet (10 mg total) by mouth 3 (three) times daily as needed for muscle spasms. 90 tablet 0   ibuprofen (ADVIL) 200 MG tablet Take 200 mg by mouth every 6 (six) hours as needed.     levothyroxine (SYNTHROID) 25 MCG tablet Take 1 tablet (25 mcg total) by mouth daily before breakfast. Take on empty stomach w/ water,  1 hour before other meds or food. 30 tablet 0   LORazepam (ATIVAN) 0.5 MG tablet Take 1 tablet (0.5 mg total) by mouth at bedtime. 30 tablet 0   metoprolol succinate (TOPROL-XL) 25 MG 24 hr tablet TAKE 1 TABLET (25 MG TOTAL) BY MOUTH DAILY. 90 tablet 3   oxyCODONE (OXY IR/ROXICODONE) 5 MG immediate release tablet Take 1 tablet (5 mg total) by mouth  every 6 (six) hours as needed for severe pain. 90 tablet 0   pantoprazole (PROTONIX) 40 MG tablet Take 1 tablet (40 mg total) by mouth 2 (two) times daily. 60 tablet 1   polyethylene glycol (MIRALAX / GLYCOLAX) 17 g packet Take 17 g by mouth 2 (two) times daily. 14 each 0   rivaroxaban (XARELTO) 20 MG TABS tablet Take 1 tablet (20 mg total) by mouth daily with supper. 30 tablet 0   scopolamine (TRANSDERM-SCOP) 1 MG/3DAYS Place 1 patch (1.5 mg total) onto the skin every 3 (three) days. 10 patch 12   No current facility-administered medications for this visit.    VITAL SIGNS: There were no vitals taken for this visit. There were no vitals filed for this visit.  Estimated body mass index is 30.92 kg/m as calculated from the following:   Height as of 04/21/22: 6' (1.829 m).   Weight as of 04/21/22: 228 lb (103.4 kg).   PERFORMANCE STATUS (ECOG) : 1 - Symptomatic but completely ambulatory   IMPRESSION: I connected with [name] [name] by phone.  No acute distress identified.  States he is doing as well as possible.  He is awaiting follow-up with his medical team at [facility]. Recently had scans to determine next options. He is tolerating certain foods. Drinking Carnation breakfast drinks several times a day.   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 He is currently taking oxycodone 5 mg every 6 hours as needed for pain, Advil in between, and flexeril as needed for spasms. Only requiring oxycodone 2-3 times daily. We will continue current regimen with close follow-up.  Denies any concerns with constipation.   I discussed the importance of continued conversation with family and their medical providers regarding overall plan of care and treatment options, ensuring decisions are within the context of the patients values and GOCs.  PLAN: Oxycodone as needed for pain Flexeril as needed for muscle spasms Scopolamine patch for excessive secretions.  Does not feel  that this is allow for much improvement.  We will continue to closely monitor.  Could consider Robinul. We will continue to closely monitor and assist with symptom management. I will plan to see patient back in 4 to 6 weeks.  Patient expressed understanding and was in agreement with this plan. He also understands that He can call the clinic at any time with any questions, concerns, or complaints.     Any controlled substances utilized were prescribed in the context of palliative care. PDMP has been reviewed.    Time Total: 35 min   Visit consisted of counseling and education dealing with the complex and emotionally intense issues of symptom management and palliative care in the setting of serious and potentially life-threatening illness.Greater than 50%  of this time was spent counseling and coordinating care related to the above assessment and plan.  Alda Lea, AGPCNP-BC  Palliative Medicine Team/[place]

## 2022-08-06 ENCOUNTER — Other Ambulatory Visit: Payer: Self-pay

## 2022-08-06 DIAGNOSIS — G893 Neoplasm related pain (acute) (chronic): Secondary | ICD-10-CM

## 2022-08-06 DIAGNOSIS — Z515 Encounter for palliative care: Secondary | ICD-10-CM

## 2022-08-06 DIAGNOSIS — L598 Other specified disorders of the skin and subcutaneous tissue related to radiation: Secondary | ICD-10-CM

## 2022-08-06 MED ORDER — OXYCODONE HCL 5 MG PO TABS: 5.0000 mg | ORAL_TABLET | Freq: Four times a day (QID) | ORAL | 0 refills | Status: DC | PRN

## 2022-08-06 NOTE — Telephone Encounter (Signed)
Pt wife called for refill, see new orders

## 2022-08-17 ENCOUNTER — Telehealth: Payer: Self-pay

## 2022-08-17 NOTE — Telephone Encounter (Signed)
Patient wife called asking for antibiotics for a possible new tooth abscess, message forwarded to oncology team.

## 2022-08-18 DIAGNOSIS — K047 Periapical abscess without sinus: Secondary | ICD-10-CM | POA: Diagnosis not present

## 2022-08-19 ENCOUNTER — Encounter: Payer: Self-pay | Admitting: Hematology

## 2022-08-19 NOTE — Telephone Encounter (Addendum)
Received message from [place] in palliative care stating that patient's wife is requesting antibiotics for a tooth abcsess. Per Dr. Lorenso Courier, patient will need to contact his PCP or ENT for this request. Left messages on patient and wife's VM with this information.

## 2022-09-03 ENCOUNTER — Inpatient Hospital Stay: Payer: Medicaid Other

## 2022-09-03 ENCOUNTER — Inpatient Hospital Stay: Payer: Medicaid Other | Admitting: Hematology and Oncology

## 2022-09-03 ENCOUNTER — Inpatient Hospital Stay: Payer: Medicaid Other | Admitting: Nurse Practitioner

## 2022-09-07 ENCOUNTER — Other Ambulatory Visit: Payer: Self-pay | Admitting: Physician Assistant

## 2022-09-07 ENCOUNTER — Other Ambulatory Visit: Payer: Self-pay | Admitting: Internal Medicine

## 2022-09-07 DIAGNOSIS — C090 Malignant neoplasm of tonsillar fossa: Secondary | ICD-10-CM

## 2022-09-07 DIAGNOSIS — G893 Neoplasm related pain (acute) (chronic): Secondary | ICD-10-CM

## 2022-09-08 ENCOUNTER — Encounter: Payer: Self-pay | Admitting: Nurse Practitioner

## 2022-09-08 ENCOUNTER — Inpatient Hospital Stay (HOSPITAL_BASED_OUTPATIENT_CLINIC_OR_DEPARTMENT_OTHER): Payer: Medicaid Other | Admitting: Physician Assistant

## 2022-09-08 ENCOUNTER — Other Ambulatory Visit: Payer: Self-pay

## 2022-09-08 ENCOUNTER — Inpatient Hospital Stay: Payer: Medicaid Other | Attending: Hematology and Oncology

## 2022-09-08 ENCOUNTER — Inpatient Hospital Stay (HOSPITAL_BASED_OUTPATIENT_CLINIC_OR_DEPARTMENT_OTHER): Payer: Medicaid Other | Admitting: Nurse Practitioner

## 2022-09-08 VITALS — BP 121/81 | HR 71 | Temp 97.8°F | Resp 18 | Ht 72.0 in | Wt 244.0 lb

## 2022-09-08 DIAGNOSIS — R530 Neoplastic (malignant) related fatigue: Secondary | ICD-10-CM | POA: Diagnosis not present

## 2022-09-08 DIAGNOSIS — C090 Malignant neoplasm of tonsillar fossa: Secondary | ICD-10-CM

## 2022-09-08 DIAGNOSIS — E039 Hypothyroidism, unspecified: Secondary | ICD-10-CM | POA: Diagnosis not present

## 2022-09-08 DIAGNOSIS — K047 Periapical abscess without sinus: Secondary | ICD-10-CM | POA: Diagnosis not present

## 2022-09-08 DIAGNOSIS — G893 Neoplasm related pain (acute) (chronic): Secondary | ICD-10-CM

## 2022-09-08 DIAGNOSIS — R6884 Jaw pain: Secondary | ICD-10-CM | POA: Diagnosis not present

## 2022-09-08 DIAGNOSIS — Z79899 Other long term (current) drug therapy: Secondary | ICD-10-CM | POA: Insufficient documentation

## 2022-09-08 DIAGNOSIS — L598 Other specified disorders of the skin and subcutaneous tissue related to radiation: Secondary | ICD-10-CM

## 2022-09-08 DIAGNOSIS — Z515 Encounter for palliative care: Secondary | ICD-10-CM

## 2022-09-08 DIAGNOSIS — C099 Malignant neoplasm of tonsil, unspecified: Secondary | ICD-10-CM | POA: Insufficient documentation

## 2022-09-08 DIAGNOSIS — Z79620 Long term (current) use of immunosuppressive biologic: Secondary | ICD-10-CM | POA: Insufficient documentation

## 2022-09-08 DIAGNOSIS — R630 Anorexia: Secondary | ICD-10-CM | POA: Diagnosis not present

## 2022-09-08 DIAGNOSIS — F17210 Nicotine dependence, cigarettes, uncomplicated: Secondary | ICD-10-CM | POA: Diagnosis not present

## 2022-09-08 DIAGNOSIS — Y842 Radiological procedure and radiotherapy as the cause of abnormal reaction of the patient, or of later complication, without mention of misadventure at the time of the procedure: Secondary | ICD-10-CM | POA: Diagnosis not present

## 2022-09-08 LAB — CBC WITH DIFFERENTIAL (CANCER CENTER ONLY)
Abs Immature Granulocytes: 0.01 10*3/uL (ref 0.00–0.07)
Basophils Absolute: 0.1 10*3/uL (ref 0.0–0.1)
Basophils Relative: 1 %
Eosinophils Absolute: 0.2 10*3/uL (ref 0.0–0.5)
Eosinophils Relative: 3 %
HCT: 47.6 % (ref 39.0–52.0)
Hemoglobin: 16.6 g/dL (ref 13.0–17.0)
Immature Granulocytes: 0 %
Lymphocytes Relative: 21 %
Lymphs Abs: 1.2 10*3/uL (ref 0.7–4.0)
MCH: 33.2 pg (ref 26.0–34.0)
MCHC: 34.9 g/dL (ref 30.0–36.0)
MCV: 95.2 fL (ref 80.0–100.0)
Monocytes Absolute: 0.6 10*3/uL (ref 0.1–1.0)
Monocytes Relative: 10 %
Neutro Abs: 3.6 10*3/uL (ref 1.7–7.7)
Neutrophils Relative %: 65 %
Platelet Count: 208 10*3/uL (ref 150–400)
RBC: 5 MIL/uL (ref 4.22–5.81)
RDW: 13.6 % (ref 11.5–15.5)
WBC Count: 5.6 10*3/uL (ref 4.0–10.5)
nRBC: 0 % (ref 0.0–0.2)

## 2022-09-08 LAB — CMP (CANCER CENTER ONLY)
ALT: 15 U/L (ref 0–44)
AST: 21 U/L (ref 15–41)
Albumin: 4.2 g/dL (ref 3.5–5.0)
Alkaline Phosphatase: 96 U/L (ref 38–126)
Anion gap: 8 (ref 5–15)
BUN: 10 mg/dL (ref 8–23)
CO2: 30 mmol/L (ref 22–32)
Calcium: 9.4 mg/dL (ref 8.9–10.3)
Chloride: 104 mmol/L (ref 98–111)
Creatinine: 0.97 mg/dL (ref 0.61–1.24)
GFR, Estimated: >60 mL/min (ref >60)
Glucose, Bld: 101 mg/dL — ABNORMAL HIGH (ref 70–99)
Potassium: 4 mmol/L (ref 3.5–5.1)
Sodium: 142 mmol/L (ref 135–145)
Total Bilirubin: 0.6 mg/dL (ref 0.3–1.2)
Total Protein: 7 g/dL (ref 6.5–8.1)

## 2022-09-08 LAB — T4, FREE: Free T4: 0.78 ng/dL (ref 0.61–1.12)

## 2022-09-08 LAB — TSH: TSH: 11.555 u[IU]/mL — ABNORMAL HIGH (ref 0.350–4.500)

## 2022-09-08 MED ORDER — OXYCODONE HCL 5 MG PO TABS: 5.0000 mg | ORAL_TABLET | Freq: Four times a day (QID) | ORAL | 0 refills | Status: DC | PRN

## 2022-09-08 NOTE — Progress Notes (Signed)
[place]  Telephone:(336) [phone] Fax:(336) [phone]   Name: [name] [name] Date: 09/08/2022 MRN: [id number]  DOB: 1961-03-28  Patient Care Team: System, Provider Not In as PCP - General Beryle Beams Alyson Locket, MD as Consulting Physician (Oncology) Leta Baptist, MD as Consulting Physician (Otolaryngology) Eppie Gibson, MD as Attending Physician (Radiation Oncology) Tish Men, MD (Inactive) as Consulting Physician (Hematology) Leota Sauers, RN (Inactive) as Oncology Nurse Navigator Pickenpack-Cousar, Carlena Sax, NP as Nurse Practitioner (Nurse Practitioner)   INTERVAL HISTORY: MARGARET STEENSTRA is a 62 y.o. 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 Palliative ask to see for symptom management.    SOCIAL HISTORY:     reports that he has been smoking cigarettes. He has a 7.50 pack-year smoking history. He has never used smokeless tobacco. He reports that he does not currently use alcohol. He reports current drug use. Drug: Marijuana.  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  MEDICATIONS:  Current Outpatient Medications  Medication Sig Dispense Refill   acetaminophen (TYLENOL) 325 MG tablet Take 2 tablets (650 mg total) by mouth every 6 (six) hours.     aspirin EC 81 MG EC tablet Take 1 tablet (81 mg total) by mouth daily. Swallow whole. 30 tablet 11   atorvastatin (LIPITOR) 20 MG tablet Take 1 tablet (20 mg total) by mouth daily. 30 tablet 0   cyclobenzaprine (FLEXERIL) 10 MG tablet Take 1 tablet (10 mg total) by mouth 3 (three) times daily as needed for muscle spasms. 90 tablet 0   ibuprofen (ADVIL) 200 MG tablet Take 200 mg by mouth every 6 (six) hours as needed.     levothyroxine (SYNTHROID) 25 MCG tablet Take 1 tablet (25 mcg total) by mouth daily before breakfast. Take on empty stomach w/ water, 1 hour before other meds or food. 30 tablet 0   LORazepam (ATIVAN) 0.5 MG tablet Take 1 tablet (0.5 mg total) by mouth at bedtime. 30 tablet 0   metoprolol succinate (TOPROL-XL) 25 MG 24 hr tablet TAKE 1 TABLET (25 MG TOTAL) BY MOUTH DAILY. 90 tablet 3   oxyCODONE (OXY IR/ROXICODONE) 5 MG immediate release tablet Take 1 tablet (5 mg total) by mouth every 6 (six) hours as needed for severe pain. 90 tablet 0   pantoprazole (PROTONIX) 40 MG tablet Take 1 tablet (40 mg total) by mouth 2 (two) times daily. 60 tablet 1   polyethylene glycol (MIRALAX /  GLYCOLAX) 17 g packet Take 17 g by mouth 2 (two) times daily. 14 each 0   rivaroxaban (XARELTO) 20 MG TABS tablet Take 1 tablet (20 mg total) by mouth daily with supper. 30 tablet 0   scopolamine (TRANSDERM-SCOP) 1 MG/3DAYS Place 1 patch (1.5 mg total) onto the skin every 3 (three) days. 10 patch 12   No current facility-administered medications for this visit.    VITAL SIGNS: There were no vitals taken for this visit. There were no vitals  filed for this visit.  Estimated body mass index is 30.92 kg/m as calculated from the following:   Height as of 04/21/22: 6' (1.829 m).   Weight as of 04/21/22: 228 lb (103.4 kg).   PERFORMANCE STATUS (ECOG) : 1 - Symptomatic but completely ambulatory  Assessment NAD RRR Ambulatory without assistive devices AAO x4  IMPRESSION: Mr. Balderson presents to clinic today for symptom management follow-up.  No acute distress noted.  Patient is ambulatory without assistive devices.  Does share recent tooth abscess which caused some swelling and increasing in pain.  States he was seen by urgent care and started on antibiotics.  He does not have a dentist that he follows up with regularly.  Denies nausea, vomiting, constipation, diarrhea.  Secretions continue to somewhat bothersome but better than previous months. He did not notice much improvement with use of Scopolamine patch. These were discontinued.   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 He is currently taking oxycodone 5 mg every 6 hours as needed for pain, Advil in between, and flexeril as needed for spasms. Only requiring oxycodone 2-3 times daily. We will continue current regimen with close follow-up.  Taking as directed. Refills appropriate past due indicating patient is taking responsibly.   I discussed the importance of continued conversation with family and their medical providers regarding overall plan of care and treatment options, ensuring decisions are within the context of the patients values and GOCs.  PLAN: Oxycodone as needed for pain Flexeril as needed for muscle spasms Scopolamine patch for excessive secretions.  Does not feel that this is allow for much improvement.  We will continue to closely monitor.  Could consider Robinul. We will continue to closely monitor and assist with symptom management. I will plan to see patient back in 4 to 6 weeks.  Patient expressed understanding  and was in agreement with this plan. He also understands that He can call the clinic at any time with any questions, concerns, or complaints.      Any controlled substances utilized were prescribed in the context of palliative care. PDMP has been reviewed.    Time Total: 30 min  Visit consisted of counseling and education dealing with the complex and emotionally intense issues of symptom management and palliative care in the setting of serious and potentially life-threatening illness.Greater than 50%  of this time was spent counseling and coordinating care related to the above assessment and plan.  Alda Lea, AGPCNP-BC  Palliative Medicine Team/[place]

## 2022-09-08 NOTE — Progress Notes (Unsigned)
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 TBenjamine Mola invasive SCCa, p16+; not a candidate for TORS -Late 10/2018 - 12/2018: definitive chemoradiation with weekly cisplatin  03/2019: end-of-treatment PET showed decrease in FDG avidity in the left tonsil but some residual soft tissue fullness, resolution of left LN disease   -2021:  Diffuse pharyngeal soft tissue thickening at the tongue base on multiple CT's  [facility]; no evidence of malignancy    #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  Started on Lovenox 1 mg/kg every 12 hours. 02/21/2021: Transition to Xarelto therapy.  Interval History:  [name] GARCIAPEREZ62y.o. male returns for a follow up for history of recurrent VTE and history of stage 1 SCC of tonsil. He was last seen by Dr. DLorenso Courieron 02/26/2022. He is unaccompanied for this visit.   Mr. SDomresreports that [facility] but has yet to hear from them to schedule surgery. He continues to have jaw pain. His pain is well controlled with his pain regimen. He recently completed a course of amoxicillin x 7 days for tooth abscess. He said the antibiotics did improve his infection but he still has some residual symptoms. He continues on a liquid diet because of his difficult chewing. He has gained weight approximately 15 lbs since 04/21/22. He denies any fevers, chills, night sweats, shortness of breath, chest pain or cough. He has no other complaints. A full 10 point ROS is listed below.  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[place] 05/25/2017   Right SFA 05/07/17 while on warfarin INR 2.9   Atrial fibrillation (HCC)    Benign essential HTN 01/04/2012   Chronic anticoagulation 01/02/2013   Dermatofibroma of forearm 01/02/2013   Left side   DVT, lower extremity, proximal ([place] 05/24/2013   Right leg femoral & popliteal 05/24/13   Hyperlipidemia, mixed 01/04/2012   Hypothyroidism    Pneumonia    Polycythemia secondary to smoking 01/04/2012  Primary hypercoagulable state ([place]) 10/01/2014   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HCC)    Sinus bradycardia, chronic 01/04/2012   Superficial thrombosis of lower extremity 05/02/2012    SURGICAL  HISTORY: Past Surgical History:  Procedure Laterality Date   ABDOMINAL AORTOGRAM W/LOWER EXTREMITY Right 11/10/2021   Procedure: ABDOMINAL AORTOGRAM W/LOWER EXTREMITY;  Surgeon: Waynetta Sandy, MD;  Location: [place] [facility];  Service: Cardiovascular;  Laterality: Right;   AORTA - BILATERAL FEMORAL ARTERY BYPASS GRAFT Bilateral 11/26/2021   Procedure: AORTO- BIILIAC ARTERY  BYPASS GRAFT USING HEMASHIELD 20 X 11MM, RIGHT ILIO-FEMORAL BYPASS  UMBILICAL HERNIA REPAIR;  Surgeon: Cherre Robins, MD;  Location: [place];  Service: Vascular;  Laterality: Bilateral;   APPLICATION OF WOUND VAC Right 11/26/2021   Procedure: APPLICATION OF WOUND VAC;  Surgeon: Cherre Robins, MD;  Location: [place];  Service: Vascular;  Laterality: Right;   DIRECT LARYNGOSCOPY Left 10/19/2018   Procedure: DIRECT LARYNGOSCOPY;  Surgeon: Leta Baptist, MD;  Location: [place];  Service: ENT;  Laterality: Left;   FEMORAL-TIBIAL BYPASS GRAFT Right 11/26/2021   Procedure: RIGHT FEMORALTIBIAL  ARTERY BYPASS GRAFT USING PROPATEN GRAFT;  Surgeon: Cherre Robins, MD;  Location: [facility];  Service: Vascular;  Laterality: Right;   IR IMAGING GUIDED PORT INSERTION  11/04/2018   IR REMOVAL TUN ACCESS W/ PORT W/O FL MOD SED  05/01/2019   TONSILLECTOMY Left 10/19/2018   Procedure: BIOPSY OF LEFT TONSIL;  Surgeon: Leta Baptist, MD;  Location: [place];  Service: ENT;  Laterality: Left;    SOCIAL HISTORY: Social History   Socioeconomic History   Marital status: Divorced    Spouse name: Not on file   Number of children: 2   Years of education: Not on file   Highest education level: Not on file  Occupational History   Not on file  Tobacco Use   Smoking status: Light Smoker    Packs/day: 0.25    Years: 30.00    Total pack years: 7.50    Types: Cigarettes    Last attempt to quit: 10/19/2018    Years since quitting: 3.8   Smokeless tobacco: Never  Vaping Use   Vaping Use: Former   Substances:  Nicotine, Flavoring   Devices: 1 cartridge q2-3 days, he tells me he is not using.   Substance and Sexual Activity   Alcohol use: Not Currently    Comment: 6 drinks per week, per patient   Drug use: Yes    Types: Marijuana   Sexual activity: Not on file  Other Topics Concern   Not on file  Social History Narrative   Not on file   Social Determinants of Health   Financial Resource Strain: Not on file  Food Insecurity: Not on file  Transportation Needs: No Transportation Needs (10/21/2018)   PRAPARE - Hydrologist (Medical): No    Lack of Transportation (Non-Medical): No  Physical Activity: Not on file  Stress: Not on file  Social Connections: Not on file  Intimate Partner Violence: Not At Risk (10/21/2018)   Humiliation, Afraid, Rape, and Kick questionnaire    Fear of Current or Ex-Partner: No    Emotionally Abused: No    Physically Abused: No    Sexually Abused: No    FAMILY HISTORY: Family History  Problem Relation Age of Onset   Stroke Father     ALLERGIES:  has No Known Allergies.  MEDICATIONS:  Current Outpatient Medications  Medication Sig Dispense Refill   acetaminophen (TYLENOL) 325 MG tablet Take 2 tablets (650 mg total) by mouth every 6 (six) hours.     aspirin EC 81 MG EC tablet Take 1 tablet (81 mg total) by mouth daily. Swallow whole. 30 tablet 11   atorvastatin (LIPITOR) 20 MG tablet Take 1 tablet (20 mg total) by mouth daily. 30 tablet 0   cyclobenzaprine (FLEXERIL) 10 MG tablet Take 1 tablet (10 mg total) by mouth 3 (three) times daily as needed for muscle spasms. 90 tablet 0   ibuprofen (ADVIL) 200 MG tablet Take 200 mg by mouth every 6 (six) hours as needed.     levothyroxine (SYNTHROID) 25 MCG tablet Take 1 tablet (25 mcg total) by mouth daily before breakfast. Take on empty stomach w/ water, 1 hour before other meds or food. 30 tablet 0   LORazepam (ATIVAN) 0.5 MG tablet Take 1 tablet (0.5 mg total) by mouth at bedtime. 30  tablet 0   metoprolol succinate (TOPROL-XL) 25 MG 24 hr tablet TAKE 1 TABLET (25 MG TOTAL) BY MOUTH DAILY. 90 tablet 3   oxyCODONE (OXY IR/ROXICODONE) 5 MG immediate release tablet Take 1 tablet (5 mg total) by mouth every 6 (six) hours as needed for severe pain. 90 tablet 0   pantoprazole (PROTONIX) 40 MG tablet Take 1 tablet (40 mg total) by mouth 2 (two) times daily. 60 tablet 1   polyethylene glycol (MIRALAX / GLYCOLAX) 17 g packet Take 17 g by mouth 2 (two) times daily. 14 each 0   rivaroxaban (XARELTO) 20 MG TABS tablet Take 1 tablet (20 mg total) by mouth daily with supper. 30 tablet 0   scopolamine (TRANSDERM-SCOP) 1 MG/3DAYS Place 1 patch (1.5 mg total) onto the skin every 3 (three) days. 10 patch 12   No current facility-administered medications for this visit.    REVIEW OF SYSTEMS:   Constitutional: ( - ) fevers, ( - )  chills , ( - ) night sweats Eyes: ( - ) blurriness of vision, ( - ) double vision, ( - ) watery eyes Ears, nose, mouth, throat, and face: ( - ) mucositis, ( - ) sore throat Respiratory: ( - ) cough, ( - ) dyspnea, ( - ) wheezes Cardiovascular: ( - ) palpitation, ( - ) chest discomfort, ( - ) lower extremity swelling Gastrointestinal:  ( - ) nausea, ( - ) heartburn, ( - ) change in bowel habits Skin: ( - ) abnormal skin rashes Lymphatics: ( - ) new lymphadenopathy, ( - ) easy bruising Neurological: ( - ) numbness, ( - ) tingling, ( - ) new weaknesses Behavioral/Psych: ( - ) mood change, ( - ) new changes  All other systems were reviewed with the patient and are negative.  PHYSICAL EXAMINATION: ECOG PERFORMANCE STATUS: 1 - Symptomatic but completely ambulatory  Vitals:   09/08/22 1313  BP: 121/81  Pulse: 71  Resp: 18  Temp: 97.8 F (36.6 C)  SpO2: 99%   Filed Weights   09/08/22 1313  Weight: 244 lb (110.7 kg)    GENERAL: well appearing middle aged Caucasian male alert, no distress and comfortable SKIN: skin color, texture, turgor are normal, no  rashes or significant lesions EYES: conjunctiva are pink and non-injected, sclera clear OROPHARYNX: exam limited as patient cannot open his mouth wide. Thick secretions note.  LUNGS: clear to auscultation and percussion with normal breathing effort HEART: regular rate & rhythm and no murmurs and no lower extremity edema PSYCH: alert &  oriented x 3, fluent speech NEURO: no focal motor/sensory deficits  LABORATORY DATA:  I have reviewed the data as listed    Latest Ref Rng & Units 09/08/2022   12:56 PM 02/26/2022    2:06 PM 12/08/2021    6:37 AM  CBC  WBC 4.0 - 10.5 K/uL 5.6  5.8  7.3   Hemoglobin 13.0 - 17.0 g/dL 16.6  15.4  11.6   Hematocrit 39.0 - 52.0 % 47.6  46.0  34.6   Platelets 150 - 400 K/uL 208  204  277        Latest Ref Rng & Units 02/26/2022    2:06 PM 12/08/2021    6:37 AM 12/06/2021    5:27 AM  CMP  Glucose 70 - 99 mg/dL 98  97  96   BUN 8 - 23 mg/dL 10  7  10   $ Creatinine 0.61 - 1.24 mg/dL 0.84  0.87  0.74   Sodium 135 - 145 mmol/L 139  139  140   Potassium 3.5 - 5.1 mmol/L 4.1  3.4  3.3   Chloride 98 - 111 mmol/L 106  106  105   CO2 22 - 32 mmol/L 27  25  26   $ Calcium 8.9 - 10.3 mg/dL 9.6  8.6  8.6   Total Protein 6.5 - 8.1 g/dL 7.2     Total Bilirubin 0.3 - 1.2 mg/dL 0.5     Alkaline Phos 38 - 126 U/L 92     AST 15 - 41 U/L 14     ALT 0 - 44 U/L 10       RADIOGRAPHIC STUDIES: No results found.   ASSESSMENT & PLAN SKIP MAREZ 62 y.o. male with medical history significant for squamous cell carcinoma of left tonsil presents for a follow up visit.    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 Lennox Laity, ENT at [place] -Multiple discussions were held with radiation oncology and ENT at [facility].  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 Patient holding on that plan at this time.  --Patient was referred to [facility] ENT for further evaluation. He saw Dr. Amada Jupiter on 04/24/2022 who recommended segmental mandibulectomy with left scap tip free flap. Patient underwent pre-op CT planning but did not hear from [facility] team for next steps. We will follow up with [facility] team to update patient on plan.   #Hypothyroidisim, radiation induced --current on levothyroxine 25 mcg PO daily.  --TSH 11.555, Free T4 normal. --Recommend to continue current dose of levothyroxine. Sent refill today  #Tooth abscess: --Sent augmentin prescription x 7 days --Strongly urged patient to follow up with dentist.   #Recurrent LLE DVT #Acute Right Upper Extremity DVT/SVT --patient had a midline placed for abx, developed a subsequent RUE DVT --this is considered provoked in the setting of a catheter, though patient is prone to clotting. --previously on therapeutic 50m/kg lovenox x 3 months since May 2022. HOLD coumadin. -- Recommended the patient not return  to Coumadin therapy --continue Xarelto 54m PO daily.  --Labs today show white blood cell count 5.8, hemoglobin 15.4, MCV 87.6, and platelets of 204 --Patient requires indefinite anticoagulation   #Pain Control --Under the care of palliative care NP, Nikki --Regimen includes oxycodone [id number] PRN and Flexeril 10 mg TID PRN.    Follow up: --Labs and follow up in 6 months No orders of the defined types were placed in this encounter.   All questions were answered. The patient knows to call the clinic with any problems, questions or  concerns.  I have spent a total of 30 minutes minutes of face-to-face and non-face-to-face time, preparing to see the patient, performing a medically appropriate examination, counseling and educating the patient, ordering meds, documenting clinical information in the electronic health record, and care coordination.   IrDede QueryA-C Dept of Hematology and OnKiestert [facility] Centerhone: [phone]

## 2022-09-09 MED ORDER — LEVOTHYROXINE SODIUM 25 MCG PO TABS: 25.0000 ug | ORAL_TABLET | Freq: Every day | ORAL | 0 refills | Status: DC

## 2022-09-09 MED ORDER — AMOXICILLIN-POT CLAVULANATE 875-125 MG PO TABS: 1.0000 | ORAL_TABLET | Freq: Two times a day (BID) | ORAL | 0 refills | Status: DC

## 2022-09-10 ENCOUNTER — Encounter: Payer: Self-pay | Admitting: Hematology

## 2022-09-10 ENCOUNTER — Telehealth: Payer: Self-pay

## 2022-09-10 NOTE — Telephone Encounter (Signed)
Pt called because he was unable to pick up his medications. RN called [facility] and a PA was needed, [facility] pharmacy confirmed pt oxycodone, Augmentin, and synthroid were ready for pick up. Attempted to call to update pt, no answer, LVM.

## 2022-09-10 NOTE — Telephone Encounter (Signed)
No longer [facility] patient

## 2022-09-11 ENCOUNTER — Telehealth: Payer: Self-pay

## 2022-09-11 NOTE — Telephone Encounter (Signed)
there is a 2/21 phone note from Dr Deforest Hoyles office regarding my call and my message regarding his surgery. the still did not elaborate on the reason for not having his surgery. I made several attempts and left several messages.

## 2022-09-21 ENCOUNTER — Inpatient Hospital Stay: Payer: Medicaid Other | Attending: Hematology and Oncology | Admitting: Nurse Practitioner

## 2022-09-21 ENCOUNTER — Encounter: Payer: Self-pay | Admitting: Nurse Practitioner

## 2022-09-21 DIAGNOSIS — G893 Neoplasm related pain (acute) (chronic): Secondary | ICD-10-CM

## 2022-09-21 DIAGNOSIS — Z515 Encounter for palliative care: Secondary | ICD-10-CM | POA: Diagnosis not present

## 2022-09-21 DIAGNOSIS — R530 Neoplastic (malignant) related fatigue: Secondary | ICD-10-CM | POA: Diagnosis not present

## 2022-09-21 NOTE — Progress Notes (Signed)
[place]  Telephone:(336) [phone] Fax:(336) [phone]   Name: [name] [name] Date: 09/21/2022 MRN: [id number]  DOB: October 22, 1960  Patient Care Team: System, Provider Not In as PCP - General Beryle Beams Alyson Locket, MD as Consulting Physician (Oncology) Leta Baptist, MD as Consulting Physician (Otolaryngology) Eppie Gibson, MD as Attending Physician (Radiation Oncology) Tish Men, MD (Inactive) as Consulting Physician (Hematology) Leota Sauers, RN (Inactive) as Oncology Nurse Navigator Pickenpack-Cousar, Carlena Sax, NP as Nurse Practitioner (Nurse Practitioner)   I connected with [name] on 09/21/22 at  1:00 PM EST by video and verified that I am speaking with the correct person using two identifiers.   I discussed the limitations, risks, security and privacy concerns of performing an evaluation and management service by telemedicine and the availability of in-person appointments. I also discussed with the patient that there may be a patient responsible charge related to this service. The patient expressed understanding and agreed to proceed.   Other persons participating in the visit and their role in the encounter: n/a   Patient's location: home  Provider's location: [facility]   Chief Complaint: f/u of symptom management   INTERVAL HISTORY: KEMONI RUDELL is a 62 y.o. 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 Palliative ask to see for symptom management.    SOCIAL HISTORY:     reports that he has been smoking cigarettes. He has a 7.50 pack-year smoking history. He has never used smokeless tobacco. He reports that he does not currently use alcohol. He reports current drug use. Drug: Marijuana.  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[place]) 05/25/2017   Right SFA 05/07/17 while on warfarin INR 2.9   Atrial fibrillation ([place])    Benign essential HTN 01/04/2012   Chronic anticoagulation 01/02/2013   Dermatofibroma of forearm 01/02/2013   Left side   DVT, lower extremity, proximal ([place]) 05/24/2013   Right leg femoral & popliteal 05/24/13   Hyperlipidemia, mixed 01/04/2012   Hypothyroidism    Pneumonia    Polycythemia secondary to smoking 01/04/2012   Primary hypercoagulable state ([place]) 10/01/2014   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HCC)    Sinus bradycardia, chronic 01/04/2012   Superficial thrombosis of lower extremity 05/02/2012    ALLERGIES:  has No Known Allergies.  MEDICATIONS:  Current Outpatient Medications  Medication Sig Dispense Refill   acetaminophen (TYLENOL) 325 MG tablet Take 2 tablets (650 mg total) by mouth every 6 (six) hours.     amoxicillin-clavulanate (AUGMENTIN) 875-125 MG tablet Take 1 tablet by mouth 2 (two) times daily. 14 tablet 0   aspirin EC 81 MG EC tablet Take 1 tablet (81 mg total) by mouth daily. Swallow whole. 30 tablet 11   atorvastatin (LIPITOR) 20 MG tablet Take 1 tablet (20 mg total) by mouth daily. 30 tablet 0   cyclobenzaprine (FLEXERIL) 10 MG tablet Take 1 tablet (10 mg total) by mouth 3 (three) times daily as needed for muscle spasms. 90 tablet 0   ibuprofen (ADVIL) 200 MG tablet Take 200 mg by mouth every 6 (six) hours as needed.  levothyroxine (SYNTHROID) 25 MCG tablet Take 1 tablet (25 mcg total) by mouth daily before breakfast. Take on empty stomach w/ water, 1 hour before other meds or food. 30 tablet 0   LORazepam (ATIVAN) 0.5 MG tablet Take 1 tablet (0.5 mg total) by mouth at bedtime. 30 tablet 0   metoprolol succinate (TOPROL-XL) 25 MG 24 hr tablet TAKE 1  TABLET (25 MG TOTAL) BY MOUTH DAILY. 90 tablet 3   oxyCODONE (OXY IR/ROXICODONE) 5 MG immediate release tablet Take 1 tablet (5 mg total) by mouth every 6 (six) hours as needed for severe pain. 90 tablet 0   pantoprazole (PROTONIX) 40 MG tablet Take 1 tablet (40 mg total) by mouth 2 (two) times daily. 60 tablet 1   polyethylene glycol (MIRALAX / GLYCOLAX) 17 g packet Take 17 g by mouth 2 (two) times daily. 14 each 0   rivaroxaban (XARELTO) 20 MG TABS tablet Take 1 tablet (20 mg total) by mouth daily with supper. 30 tablet 0   No current facility-administered medications for this visit.    VITAL SIGNS: There were no vitals taken for this visit. There were no vitals filed for this visit.  Estimated body mass index is 33.09 kg/m as calculated from the following:   Height as of 09/08/22: 6' (1.829 m).   Weight as of 09/08/22: 244 lb (110.7 kg).   PERFORMANCE STATUS (ECOG) : 1 - Symptomatic but completely ambulatory  Assessment NAD RRR Ambulatory without assistive devices AAO x4  IMPRESSION: I connected with Mr. Greenwalt by video. No acute distress noted. Reports waiting on phone call from [facility] surgical team. Overall doing "okay". Denies nausea, vomiting, constipation, or diarrhea.   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 He is currently taking oxycodone 5 mg every 6 hours as needed for pain, Advil in between, and flexeril as needed for spasms. Only requiring oxycodone 1-2 times daily. We will continue current regimen with close follow-up.  Taking as directed. Refills appropriate past due indicating patient is taking responsibly.   I discussed the importance of continued conversation with family and their medical providers regarding overall plan of care and treatment options, ensuring decisions are within the context of the patients values and GOCs.  PLAN: Oxycodone as needed for pain Flexeril as needed for muscle spasms Scopolamine patch for  excessive secretions.  Does not feel that this is allow for much improvement.  We will continue to closely monitor.  Could consider Robin[place]. We will continue to closely monitor and assist with symptom management. I will plan to see patient back in 4 to 6 weeks.  Patient expressed understanding and was in agreement with this plan. He also understands that He can call the clinic at any time with any questions, concerns, or complaints.     Any controlled substances utilized were prescribed in the context of palliative care. PDMP has been reviewed.    Time Total: 20 min   Visit consisted of counseling and education dealing with the complex and emotionally intense issues of symptom management and palliative care in the setting of serious and potentially life-threatening illness.Greater than 50%  of this time was spent counseling and coordinating care related to the above assessment and plan.  Alda Lea, AGPCNP-BC  Palliative Medicine Team/[place]

## 2022-09-24 NOTE — Progress Notes (Signed)
Fax received from [facility] Ot[facility] for medical clearance/medication hold for Xarelto and aspirin to be signed by Dr. [name]. Pt is having segmental mandibulectomy, possible tracheostomy, neck exploration, and free flap reconstruction.  Provider signed, form faxed back to sender, verified successful, sent to scan center.

## 2022-10-07 DIAGNOSIS — M272 Inflammatory conditions of jaws: Secondary | ICD-10-CM | POA: Diagnosis not present

## 2022-10-07 DIAGNOSIS — Z85818 Personal history of malignant neoplasm of other sites of lip, oral cavity, and pharynx: Secondary | ICD-10-CM | POA: Diagnosis not present

## 2022-10-07 DIAGNOSIS — Z923 Personal history of irradiation: Secondary | ICD-10-CM | POA: Diagnosis not present

## 2022-10-09 ENCOUNTER — Other Ambulatory Visit: Payer: Self-pay | Admitting: *Deleted

## 2022-10-09 ENCOUNTER — Other Ambulatory Visit: Payer: Self-pay | Admitting: Nurse Practitioner

## 2022-10-09 DIAGNOSIS — Z515 Encounter for palliative care: Secondary | ICD-10-CM

## 2022-10-09 DIAGNOSIS — G893 Neoplasm related pain (acute) (chronic): Secondary | ICD-10-CM

## 2022-10-09 DIAGNOSIS — Y842 Radiological procedure and radiotherapy as the cause of abnormal reaction of the patient, or of later complication, without mention of misadventure at the time of the procedure: Secondary | ICD-10-CM

## 2022-10-09 DIAGNOSIS — C090 Malignant neoplasm of tonsillar fossa: Secondary | ICD-10-CM

## 2022-10-09 MED ORDER — OXYCODONE HCL 5 MG PO TABS: 5.0000 mg | ORAL_TABLET | Freq: Four times a day (QID) | ORAL | 0 refills | Status: DC | PRN

## 2022-10-09 MED ORDER — LEVOTHYROXINE SODIUM 25 MCG PO TABS: 25.0000 ug | ORAL_TABLET | Freq: Every day | ORAL | 0 refills | Status: DC

## 2022-11-09 ENCOUNTER — Other Ambulatory Visit: Payer: Self-pay

## 2022-11-09 DIAGNOSIS — I739 Peripheral vascular disease, unspecified: Secondary | ICD-10-CM

## 2022-11-10 ENCOUNTER — Inpatient Hospital Stay: Payer: Medicaid Other | Attending: Hematology and Oncology | Admitting: Nurse Practitioner

## 2022-11-10 ENCOUNTER — Encounter: Payer: Self-pay | Admitting: Nurse Practitioner

## 2022-11-10 ENCOUNTER — Other Ambulatory Visit: Payer: Self-pay | Admitting: Internal Medicine

## 2022-11-10 DIAGNOSIS — Z515 Encounter for palliative care: Secondary | ICD-10-CM

## 2022-11-10 DIAGNOSIS — Y842 Radiological procedure and radiotherapy as the cause of abnormal reaction of the patient, or of later complication, without mention of misadventure at the time of the procedure: Secondary | ICD-10-CM | POA: Diagnosis not present

## 2022-11-10 DIAGNOSIS — R530 Neoplastic (malignant) related fatigue: Secondary | ICD-10-CM | POA: Diagnosis not present

## 2022-11-10 DIAGNOSIS — G893 Neoplasm related pain (acute) (chronic): Secondary | ICD-10-CM | POA: Diagnosis not present

## 2022-11-10 DIAGNOSIS — L598 Other specified disorders of the skin and subcutaneous tissue related to radiation: Secondary | ICD-10-CM

## 2022-11-10 DIAGNOSIS — E785 Hyperlipidemia, unspecified: Secondary | ICD-10-CM

## 2022-11-10 MED ORDER — OXYCODONE HCL 5 MG PO TABS
5.0000 mg | ORAL_TABLET | Freq: Four times a day (QID) | ORAL | 0 refills | Status: DC | PRN
Start: 2022-11-10 — End: 2022-12-15

## 2022-11-10 NOTE — Progress Notes (Signed)
Palliative Medicine [facility]  Telephone:([phone] Fax:(336) [phone]   Name: [name] [name] Date: 11/10/2022 MRN: [id number]  DOB: 1960/10/03  Patient Care Team: System, Provider Not In as PCP - General Cyndie Chime Genene Churn, MD as Consulting Physician (Oncology) Newman Pies, MD as Consulting Physician (Otolaryngology) Lonie Peak, MD as Attending Physician (Radiation Oncology) Arthur Holms, MD (Inactive) as Consulting Physician (Hematology) Barrie Folk, RN (Inactive) as Oncology Nurse Navigator Pickenpack-Cousar, Arty Baumgartner, NP as Nurse Practitioner (Nurse Practitioner)   I connected with [name] [name] on 11/10/22 at 11:00 AM EDT by phone and verified that I am speaking with the correct person using two identifiers.   I discussed the limitations, risks, security and privacy concerns of performing an evaluation and management service by telemedicine and the availability of in-person appointments. I also discussed with the patient that there may be a patient responsible charge related to this service. The patient expressed understanding and agreed to proceed.   Other persons participating in the visit and their role in the encounter: n/a   Patient's location: home  Provider's location: [facility]   Chief Complaint: f/u of symptom management    INTERVAL HISTORY: [name] [name] is a 62 y.o. 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 Palliative ask to see for symptom management.    SOCIAL HISTORY:     reports that he has been smoking cigarettes. He has a 7.50 pack-year smoking history. He has never used smokeless tobacco. He reports that he does not currently use alcohol. He reports current drug use. Drug: Marijuana.  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   Right SFA 05/07/17 while on warfarin INR 2.9   Atrial fibrillation (HCC)    Benign essential HTN 01/04/2012   Chronic anticoagulation 01/02/2013   Dermatofibroma of forearm 01/02/2013   Left side   DVT, lower extremity, proximal (HCC) 05/24/2013   Right leg femoral & popliteal 05/24/13   Hyperlipidemia, mixed 01/04/2012   Hypothyroidism    Pneumonia    Polycythemia secondary to smoking 01/04/2012   Primary hypercoagulable state (HCC) 10/01/2014   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HCC)    Sinus bradycardia, chronic 01/04/2012   Superficial thrombosis of lower extremity 05/02/2012    ALLERGIES:  has No Known Allergies.  MEDICATIONS:  Current Outpatient Medications  Medication Sig Dispense Refill   acetaminophen (TYLENOL) 325 MG tablet Take 2 tablets (650 mg total) by mouth every 6 (six) hours.     amoxicillin-clavulanate (AUGMENTIN) 875-125 MG tablet Take 1 tablet by mouth 2 (two) times daily. 14 tablet 0   aspirin EC 81 MG EC tablet Take 1 tablet (81 mg total) by mouth daily. Swallow whole. 30 tablet 11   atorvastatin (LIPITOR) 20 MG tablet Take 1 tablet (20 mg total) by mouth daily. 30 tablet 0   cyclobenzaprine (FLEXERIL) 10 MG tablet Take 1 tablet (10 mg total) by mouth 3 (three) times daily as needed for muscle spasms. 90 tablet 0   ibuprofen (ADVIL) 200 MG tablet Take 200 mg by mouth every 6 (six) hours as needed.  levothyroxine (SYNTHROID) 25 MCG tablet Take 1 tablet (25 mcg total) by mouth daily before breakfast. Take on empty stomach w/ water, 1 hour before other meds or food. 90 tablet 0   LORazepam (ATIVAN) 0.5 MG tablet Take 1 tablet (0.5 mg total) by mouth at bedtime. 30 tablet 0   metoprolol succinate (TOPROL-XL) 25 MG 24 hr tablet  TAKE 1 TABLET (25 MG TOTAL) BY MOUTH DAILY. 90 tablet 3   oxyCODONE (OXY IR/ROXICODONE) 5 MG immediate release tablet Take 1 tablet (5 mg total) by mouth every 6 (six) hours as needed for severe pain. 90 tablet 0   pantoprazole (PROTONIX) 40 MG tablet Take 1 tablet (40 mg total) by mouth 2 (two) times daily. 60 tablet 1   polyethylene glycol (MIRALAX / GLYCOLAX) 17 g packet Take 17 g by mouth 2 (two) times daily. 14 each 0   rivaroxaban (XARELTO) 20 MG TABS tablet Take 1 tablet (20 mg total) by mouth daily with supper. 30 tablet 0   No current facility-administered medications for this visit.    VITAL SIGNS: There were no vitals taken for this visit. There were no vitals filed for this visit.  Estimated body mass index is 33.09 kg/m as calculated from the following:   Height as of 09/08/22: 6' (1.829 m).   Weight as of 09/08/22: 244 lb (110.7 kg).   PERFORMANCE STATUS (ECOG) : 1 - Symptomatic but completely ambulatory  Assessment NAD RRR Ambulatory without assistive devices AAO x4  IMPRESSION:  I connected by phone with [name] [name]. No acute distress noted. Taking things one day at a time. Reports pending appointments with oral surgeon. Denies nausea, vomiting, constipation, or diarrhea. Is trying to remain as active as possible. Continues to require soft/liquid form foods.   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 He is currently taking oxycodone 5 mg every 6 hours as needed for pain, Advil in between, and flexeril as needed for spasms. Does not require oxycodone daily. We will continue current regimen with close follow-up.  Taking as directed. Refills appropriate past due indicating patient is taking responsibly.   I discussed the importance of continued conversation with family and their medical providers regarding overall plan of care and treatment options, ensuring decisions are within the context of the patients values and  GOCs.  PLAN: Oxycodone as needed for pain. Not requiring daily Flexeril as needed for muscle spasms We will continue to closely monitor and assist with symptom management. I will plan to see patient back in 4 to 6 weeks.  Patient expressed understanding and was in agreement with this plan. He also understands that He can call the clinic at any time with any questions, concerns, or complaints.      Any controlled substances utilized were prescribed in the context of palliative care. PDMP has been reviewed.    Visit consisted of counseling and education dealing with the complex and emotionally intense issues of symptom management and palliative care in the setting of serious and potentially life-threatening illness.Greater than 50%  of this time was spent counseling and coordinating care related to the above assessment and plan.  Willette Alma, AGPCNP-BC  Palliative Medicine Team/[facility]  *Please note that this is a verbal dictation therefore any spelling or grammatical errors are due to the "[employer]agon [employer] One" system interpretation.

## 2022-11-17 ENCOUNTER — Ambulatory Visit (HOSPITAL_COMMUNITY)
Admission: RE | Admit: 2022-11-17 | Discharge: 2022-11-17 | Disposition: A | Discharge status: Home / Self Care | Payer: Medicaid Other | Source: Ambulatory Visit | Attending: Vascular Surgery | Admitting: Vascular Surgery

## 2022-11-17 ENCOUNTER — Ambulatory Visit: Payer: Medicaid Other | Admitting: Physician Assistant

## 2022-11-17 VITALS — BP 120/82 | HR 52 | Temp 98.0°F | Resp 20 | Ht 72.0 in | Wt 244.0 lb

## 2022-11-17 DIAGNOSIS — I739 Peripheral vascular disease, unspecified: Secondary | ICD-10-CM

## 2022-11-17 LAB — VAS US ABI WITH/WO TBI
Left ABI: 1.19
Right ABI: 0.59

## 2022-11-17 NOTE — Progress Notes (Signed)
[facility] [place] HISTORY AND PHYSICAL   History of Present Illness:      Patient is a 62 y.o. year old male who presents for evaluation of PAD mixed with aneurysmal disease and right GT tissue loss.  Dr. Lenell Antu performed the following on 11/26/21 he under went  PROCEDURE:     1) aorto-bi-iliac bypass (20x80mm Dacron) 2) right ilio-femoral bypass (8mm Dacron) 3) right femoral - posterior tibial artery bypass (6mm) 4) umbilical hernia repair   The GT wound has fully healed, but the right LE PTFE bypass is known to be occluded since his last visit 6 months ago.  He states he has no change in his function.  He does have small superficial toe wounds from his dog stepping on his foot, but he is really not sure how it happened.  He has significant peripheral neuropathy.  He states he just noticed it 2 days ago. He denies claudication at short distances and rest pain.   He denies fever and chills.      He is managed on Xarelto and is followed by a Hematologist for history of recurrent DVT. He also has history of A fib. He has been managed on Coumadin, Eliquis and now Xarelto. He is also medically managed on ASA and Lipitor.     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   Right SFA 05/07/17 while on warfarin INR 2.9   Atrial fibrillation (HCC)    Benign essential HTN 01/04/2012   Chronic anticoagulation 01/02/2013   Dermatofibroma of forearm 01/02/2013   Left side   DVT, lower extremity, proximal (HCC) 05/24/2013   Right leg femoral & popliteal 05/24/13   Hyperlipidemia, mixed 01/04/2012   Hypothyroidism    Pneumonia    Polycythemia secondary to smoking 01/04/2012   Primary hypercoagulable state  (HCC) 10/01/2014   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HCC)    Sinus bradycardia, chronic 01/04/2012   Superficial thrombosis of lower extremity 05/02/2012    Past Surgical History:  Procedure Laterality Date   ABDOMINAL AORTOGRAM W/LOWER EXTREMITY Right 11/10/2021   Procedure: ABDOMINAL AORTOGRAM W/LOWER EXTREMITY;  Surgeon: Maeola Harman, MD;  Location: [facility] INVASIVE CV LAB;  Service: Cardiovascular;  Laterality: Right;   AORTA - BILATERAL FEMORAL ARTERY BYPASS GRAFT Bilateral 11/26/2021   Procedure: AORTO- BIILIAC ARTERY  BYPASS GRAFT USING HEMASHIELD 20 X , RIGHT ILIO-FEMORAL BYPASS  UMBILICAL HERNIA REPAIR;  Surgeon: Leonie Douglas, MD;  Location: [facility] OR;  Service: Vascular;  Laterality: Bilateral;   APPLICATION OF WOUND VAC Right 11/26/2021   Procedure: APPLICATION OF WOUND VAC;  Surgeon: Leonie Douglas, MD;  Location: [facility] OR;  Service: Vascular;  Laterality: Right;   DIRECT LARYNGOSCOPY Left 10/19/2018   Procedure: DIRECT LARYNGOSCOPY;  Surgeon: Newman Pies, MD;  Location: [facility];  Service: ENT;  Laterality: Left;   FEMORAL-TIBIAL BYPASS GRAFT Right 11/26/2021   Procedure: RIGHT FEMORALTIBIAL  ARTERY BYPASS GRAFT USING PROPATEN GRAFT;  Surgeon: Leonie Douglas, MD;  Location: [facility] OR;  Service: Vascular;  Laterality: Right;   IR IMAGING GUIDED PORT INSERTION  11/04/2018   IR REMOVAL TUN ACCESS W/ PORT W/O FL MOD SED  05/01/2019   TONSILLECTOMY Left 10/19/2018   Procedure:  BIOPSY OF LEFT TONSIL;  Surgeon: Newman Pies, MD;  Location: [facility];  Service: ENT;  Laterality: Left;    ROS:   General:  No weight loss, Fever, chills  HEENT: No recent headaches, no nasal bleeding, no visual changes, no sore throat  Neurologic: No dizziness, blackouts, seizures. No recent symptoms of stroke or mini- stroke. No recent episodes of slurred speech, or temporary blindness.  Cardiac: No recent episodes of chest pain/pressure, no shortness of breath at  rest.  No shortness of breath with exertion.  Denies history of atrial fibrillation or irregular heartbeat  Vascular: No history of rest pain in feet.  No history of claudication.  positive history of non-healing ulcer, positive history of DVT   Pulmonary: No home oxygen, no productive cough, no hemoptysis,  No asthma or wheezing  Musculoskeletal:  [x ] Arthritis, [ ]  Low back pain,  [x ] Joint pain  Hematologic:No history of hypercoagulable state.  No history of easy bleeding.  No history of anemia  Gastrointestinal: No hematochezia or melena,  No gastroesophageal reflux, no trouble swallowing  Urinary: [ ]  chronic Kidney disease, [ ]  on HD - [ ]  MWF or [ ]  TTHS, [ ]  Burning with urination, [ ]  Frequent urination, [ ]  Difficulty urinating;   Skin: No rashes  Psychological: No history of anxiety,  No history of depression  Social History Social History   Tobacco Use   Smoking status: Former    Packs/day: 0.25    Years: 30.00    Additional pack years: 0.00    Total pack years: 7.50    Types: Cigarettes    Quit date: 10/19/2018    Years since quitting: 4.0    Passive exposure: Never (1 a week)   Smokeless tobacco: Never  Vaping Use   Vaping Use: Former   Substances: Nicotine, Flavoring   Devices: 1 cartridge q2-3 days, he tells me he is not using.   Substance Use Topics   Alcohol use: Not Currently    Comment: 6 drinks per week, per patient   Drug use: Yes    Types: Marijuana    Family History Family History  Problem Relation Age of Onset   Stroke Father     Allergies  No Known Allergies   Current Outpatient Medications  Medication Sig Dispense Refill   acetaminophen (TYLENOL) 325 MG tablet Take 2 tablets (650 mg total) by mouth every 6 (six) hours.     amoxicillin-clavulanate (AUGMENTIN) 875-125 MG tablet Take 1 tablet by mouth 2 (two) times daily. 14 tablet 0   aspirin EC 81 MG EC tablet Take 1 tablet (81 mg total) by mouth daily. Swallow whole. 30 tablet 11    atorvastatin (LIPITOR) 20 MG tablet TAKE 1 TABLET BY MOUTH EVERY DAY 90 tablet 3   cyclobenzaprine (FLEXERIL) 10 MG tablet Take 1 tablet (10 mg total) by mouth 3 (three) times daily as needed for muscle spasms. 90 tablet 0   gabapentin (NEURONTIN) 100 MG capsule Take by mouth.     ibuprofen (ADVIL) 200 MG tablet Take 200 mg by mouth every 6 (six) hours as needed.     levothyroxine (SYNTHROID) 25 MCG tablet Take 1 tablet (25 mcg total) by mouth daily before breakfast. Take on empty stomach w/ water, 1 hour before other meds or food. 90 tablet 0   LORazepam (ATIVAN) 0.5 MG tablet Take 1 tablet (0.5 mg total) by mouth at bedtime. 30 tablet 0   metoprolol succinate (TOPROL-XL) 25 MG  24 hr tablet TAKE 1 TABLET (25 MG TOTAL) BY MOUTH DAILY. 90 tablet 3   oxyCODONE (OXY IR/ROXICODONE) 5 MG immediate release tablet Take 1 tablet (5 mg total) by mouth every 6 (six) hours as needed for severe pain. 90 tablet 0   pantoprazole (PROTONIX) 40 MG tablet Take 1 tablet (40 mg total) by mouth 2 (two) times daily. 60 tablet 1   polyethylene glycol (MIRALAX / GLYCOLAX) 17 g packet Take 17 g by mouth 2 (two) times daily. 14 each 0   rivaroxaban (XARELTO) 20 MG TABS tablet Take 1 tablet (20 mg total) by mouth daily with supper. 30 tablet 0   No current facility-administered medications for this visit.    Physical Examination  Vitals:   11/17/22 1453  BP: 120/82  Pulse: (!) 52  Resp: 20  Temp: 98 F (36.7 C)  TempSrc: Temporal  SpO2: 96%  Weight: 244 lb (110.7 kg)  Height: 6' (1.829 m)    Body mass index is 33.09 kg/m.  General:  Alert and oriented, no acute distress HEENT: Normal Neck: No bruit or JVD Pulmonary: Clear to auscultation bilaterally Cardiac: Regular Rate and Rhythm without murmur Abdomen: Soft, non-tender, non-distended, no mass, no scars Skin: No rash  Extremity Pulses:   radial,  femoral, left dorsalis pedis, posterior tibial pulses.  Non palpable right LE pulses.  Musculoskeletal:  B lower leg edema edema  Neurologic: Upper and lower extremity motor 5/5 and symmetric  DATA:  ABI Findings:  +---------+------------------+-----+----------+--------+  Right   Rt Pressure (mmHg)IndexWaveform  Comment   +---------+------------------+-----+----------+--------+  Brachial 125                                        +---------+------------------+-----+----------+--------+  PTA     81                0.59 monophasic          +---------+------------------+-----+----------+--------+  DP      0                 0.00 absent              +---------+------------------+-----+----------+--------+  Great Toe0                 0.00 Absent              +---------+------------------+-----+----------+--------+   +---------+------------------+-----+-----------+-------+  Left    Lt Pressure (mmHg)IndexWaveform   Comment  +---------+------------------+-----+-----------+-------+  Brachial 137                                        +---------+------------------+-----+-----------+-------+  PTA     146               1.07 multiphasic         +---------+------------------+-----+-----------+-------+  DP      163               1.19 multiphasic         +---------+------------------+-----+-----------+-------+  Great Toe116               0.85 Normal              +---------+------------------+-----+-----------+-------+   +-------+-----------+-----------+------------+------------+  ABI/TBIToday's ABIToday's TBIPrevious ABIPrevious TBI  +-------+-----------+-----------+------------+------------+  Right 0.59       0  0.52        0             +-------+-----------+-----------+------------+------------+  Left  1.19       0.85       1.34        0.96          +-------+-----------+-----------+------------+------------+        Arterial wall calcification precludes accurate ankle pressures and ABIs.  Right ABIs appear  essentially unchanged. Bilateral TBIs appear essentially  unchanged. Left ABI is decreased but is in the normal range.    Summary:  Right: Resting right ankle-brachial index indicates moderate right lower  extremity arterial disease. The right toe-brachial index is abnormal.   Left: Resting left ankle-brachial index is within normal range. The left  toe-brachial index is normal.    ASSESSMENT/PLAN:  PAD mixed with aneurysmal disease Now s/p    PROCEDURE:   1) aorto-bi-iliac bypass (20x80mm Dacron) 2) right ilio-femoral bypass (8mm Dacron) 3) right femoral - posterior tibial artery bypass (6mm) 4) umbilical hernia repair  His ABI's are stable and basically unchanged. He has palpable pedal pulses on the left LE.     Failed right LE PTFE bypass occluded within 5 months.  He did however heal the right GT wound.  He now has small superficial wounds on the 2-4 toes.  There is no erythema or purulent drainage.  He will wash with soap and water and check his skin daily.  If he develops worsening wounds he will call us.I will have him return in 3 weeks for a wound check.  He will f/u in 6 months for repeat ABI's.          Mosetta Pigeon PA-C [facility] [place] Office: [phone]  MD in clinic [place]

## 2022-11-25 ENCOUNTER — Other Ambulatory Visit: Payer: Self-pay | Admitting: Internal Medicine

## 2022-11-27 ENCOUNTER — Other Ambulatory Visit: Payer: Self-pay | Admitting: *Deleted

## 2022-11-27 ENCOUNTER — Other Ambulatory Visit: Payer: Self-pay

## 2022-11-27 DIAGNOSIS — I739 Peripheral vascular disease, unspecified: Secondary | ICD-10-CM

## 2022-11-27 MED ORDER — RIVAROXABAN 20 MG PO TABS: 20.0000 mg | ORAL_TABLET | Freq: Every day | ORAL | 3 refills | Status: DC

## 2022-11-27 NOTE — Telephone Encounter (Signed)
No longer this patient's PCP

## 2022-12-02 DIAGNOSIS — J3801 Paralysis of vocal cords and larynx, unilateral: Secondary | ICD-10-CM | POA: Diagnosis not present

## 2022-12-02 DIAGNOSIS — M272 Inflammatory conditions of jaws: Secondary | ICD-10-CM | POA: Diagnosis not present

## 2022-12-02 DIAGNOSIS — C109 Malignant neoplasm of oropharynx, unspecified: Secondary | ICD-10-CM | POA: Diagnosis not present

## 2022-12-08 ENCOUNTER — Ambulatory Visit: Payer: Medicaid Other

## 2022-12-15 ENCOUNTER — Other Ambulatory Visit: Payer: Self-pay

## 2022-12-15 DIAGNOSIS — Z515 Encounter for palliative care: Secondary | ICD-10-CM

## 2022-12-15 DIAGNOSIS — G893 Neoplasm related pain (acute) (chronic): Secondary | ICD-10-CM

## 2022-12-15 DIAGNOSIS — Y842 Radiological procedure and radiotherapy as the cause of abnormal reaction of the patient, or of later complication, without mention of misadventure at the time of the procedure: Secondary | ICD-10-CM

## 2022-12-15 MED ORDER — OXYCODONE HCL 5 MG PO TABS
5.0000 mg | ORAL_TABLET | Freq: Four times a day (QID) | ORAL | 0 refills | Status: DC | PRN
Start: 2022-12-15 — End: 2023-01-15

## 2022-12-15 NOTE — Telephone Encounter (Signed)
Pt called for refill, see new orders.  

## 2022-12-22 ENCOUNTER — Inpatient Hospital Stay: Payer: Medicaid Other | Admitting: Nurse Practitioner

## 2022-12-22 NOTE — Progress Notes (Deleted)
Palliative Medicine [facility]  Telephone:(336) [phone] Fax:(336) [phone]   Name: [name] [name] Date: 12/22/2022 MRN: [id number]  DOB: [birth year]  Patient Care Team: System, Provider Not In as PCP - General Cyndie Chime Genene Churn, MD as Consulting Physician (Oncology) Newman Pies, MD as Consulting Physician (Otolaryngology) Lonie Peak, MD as Attending Physician (Radiation Oncology) Arthur Holms, MD (Inactive) as Consulting Physician (Hematology) Barrie Folk, RN (Inactive) as Oncology Nurse Navigator Pickenpack-Cousar, Arty Baumgartner, NP as Nurse Practitioner (Nurse Practitioner)   I connected with [name] [name] on 12/22/22 at 11:00 AM EDT by phone and verified that I am speaking with the correct person using two identifiers.   I discussed the limitations, risks, security and privacy concerns of performing an evaluation and management service by telemedicine and the availability of in-person appointments. I also discussed with the patient that there may be a patient responsible charge related to this service. The patient expressed understanding and agreed to proceed.   Other persons participating in the visit and their role in the encounter: n/a   Patient's location: home  Provider's location: [facility]   Chief Complaint: f/u symptom    INTERVAL HISTORY: [name] [name] is a 62 y.o. 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 Palliative ask to see for symptom management.    SOCIAL HISTORY:     reports that he quit smoking about 4 years ago. His smoking use included cigarettes. He has a 7.50 pack-year smoking history. He has never been exposed to tobacco smoke. He has never used smokeless tobacco. He reports that he does not currently use alcohol. He reports current drug use. Drug: Marijuana.  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   Right SFA 05/07/17 while on warfarin INR 2.9   Atrial fibrillation (HCC)    Benign essential HTN 01/04/2012   Chronic anticoagulation 01/02/2013   Dermatofibroma of forearm 01/02/2013   Left side   DVT, lower extremity, proximal (HCC) 05/24/2013   Right leg femoral & popliteal 05/24/13   Hyperlipidemia, mixed 01/04/2012   Hypothyroidism    Pneumonia    Polycythemia secondary to smoking 01/04/2012   Primary hypercoagulable state (HCC) 10/01/2014   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HCC)    Sinus bradycardia, chronic 01/04/2012   Superficial thrombosis of lower extremity 05/02/2012    ALLERGIES:  has No Known Allergies.  MEDICATIONS:  Current Outpatient Medications  Medication Sig Dispense Refill   acetaminophen (TYLENOL) 325 MG tablet Take 2 tablets (650 mg total) by mouth every 6 (six) hours.     amoxicillin-clavulanate (AUGMENTIN) 875-125 MG tablet Take 1 tablet by mouth 2 (two) times daily. 14 tablet 0   aspirin EC 81 MG EC tablet Take 1 tablet (81 mg total) by mouth daily. Swallow whole. 30 tablet 11   atorvastatin (LIPITOR) 20 MG tablet TAKE 1 TABLET BY MOUTH EVERY DAY 90 tablet 3   cyclobenzaprine (FLEXERIL) 10 MG tablet Take 1 tablet (10 mg total) by mouth 3 (three) times daily as needed for muscle spasms. 90 tablet 0   gabapentin (NEURONTIN) 100 MG capsule Take by  mouth.     ibuprofen (ADVIL) 200 MG tablet Take 200 mg by mouth every 6 (six) hours as needed.     levothyroxine (SYNTHROID) 25 MCG tablet Take 1 tablet (25 mcg total) by mouth daily before breakfast. Take on empty stomach w/ water, 1 hour before other meds or food. 90 tablet 0   LORazepam (ATIVAN) 0.5 MG tablet Take 1  tablet (0.5 mg total) by mouth at bedtime. 30 tablet 0   metoprolol succinate (TOPROL-XL) 25 MG 24 hr tablet TAKE 1 TABLET (25 MG TOTAL) BY MOUTH DAILY. 90 tablet 3   oxyCODONE (OXY IR/ROXICODONE) 5 MG immediate release tablet Take 1 tablet (5 mg total) by mouth every 6 (six) hours as needed for severe pain. 90 tablet 0   pantoprazole (PROTONIX) 40 MG tablet Take 1 tablet (40 mg total) by mouth 2 (two) times daily. 60 tablet 1   polyethylene glycol (MIRALAX / GLYCOLAX) 17 g packet Take 17 g by mouth 2 (two) times daily. 14 each 0   rivaroxaban (XARELTO) 20 MG TABS tablet Take 1 tablet (20 mg total) by mouth daily with supper. 30 tablet 3   No current facility-administered medications for this visit.    VITAL SIGNS: There were no vitals taken for this visit. There were no vitals filed for this visit.  Estimated body mass index is 33.09 kg/m as calculated from the following:   Height as of 11/17/22: 6' (1.829 m).   Weight as of 11/17/22: 244 lb (110.7 kg).   PERFORMANCE STATUS (ECOG) : 1 - Symptomatic but completely ambulatory  Assessment NAD RRR Ambulatory without assistive devices AAO x4  IMPRESSION:    Pain related to radionecrosis [name] [name] reports pain is well controlled on current regimen. He is currently taking oxycodone 5 mg every 6 hours as needed for pain, Advil in between, and flexeril as needed for spasms. Does not require oxycodone daily. We will continue current regimen with close follow-up.  Taking as directed. Refills appropriate past due indicating patient is taking responsibly.   I discussed the importance of continued conversation with family and their medical providers regarding overall plan of care and treatment options, ensuring decisions are within the context of the patients values and GOCs.  PLAN: Oxycodone as needed for pain. Not requiring daily Flexeril as needed for muscle spasms We will continue to closely monitor and assist with symptom management. I will  plan to see patient back in 4 to 6 weeks.  Patient expressed understanding and was in agreement with this plan. He also understands that He can call the clinic at any time with any questions, concerns, or complaints.      Any controlled substances utilized were prescribed in the context of palliative care. PDMP has been reviewed.    Visit consisted of counseling and education dealing with the complex and emotionally intense issues of symptom management and palliative care in the setting of serious and potentially life-threatening illness.Greater than 50%  of this time was spent counseling and coordinating care related to the above assessment and plan.  Willette Alma, AGPCNP-BC  Palliative Medicine Team/[facility]  *Please note that this is a verbal dictation therefore any spelling or grammatical errors are due to the "Dragon Medical One" system interpretation.

## 2022-12-24 ENCOUNTER — Telehealth: Payer: Self-pay | Admitting: Nurse Practitioner

## 2022-12-24 NOTE — Telephone Encounter (Signed)
Patient is aware of upcoming appointments. °

## 2022-12-30 ENCOUNTER — Encounter: Payer: Self-pay | Admitting: Hematology

## 2022-12-30 NOTE — Progress Notes (Signed)
Opened in error

## 2023-01-08 NOTE — Progress Notes (Unsigned)
Palliative Medicine [facility]  Telephone:(336) [phone] Fax:(336) [phone]   Name: [name] [name] Date: 01/08/2023 MRN: [id number]  DOB: May 04, 1961  Patient Care Team: System, Provider Not In as PCP - General Cyndie Chime Genene Churn, MD as Consulting Physician (Oncology) Newman Pies, MD as Consulting Physician (Otolaryngology) Lonie Peak, MD as Attending Physician (Radiation Oncology) Arthur Holms, MD (Inactive) as Consulting Physician (Hematology) Barrie Folk, RN (Inactive) as Oncology Nurse Navigator Pickenpack-Cousar, Arty Baumgartner, NP as Nurse Practitioner (Nurse Practitioner)   I connected with [name] [name] on 01/08/23 at  2:30 PM EDT by phone and verified that I am speaking with the correct person using two identifiers.   I discussed the limitations, risks, security and privacy concerns of performing an evaluation and management service by telemedicine and the availability of in-person appointments. I also discussed with the patient that there may be a patient responsible charge related to this service. The patient expressed understanding and agreed to proceed.   Other persons participating in the visit and their role in the encounter: n/a   Patient's location: home  Provider's location: [facility]   Chief Complaint: f/u of symptom management    INTERVAL HISTORY: [name] [name] is a 62 y.o. 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 Palliative ask to see for symptom management.    SOCIAL HISTORY:     reports that he quit smoking about 4 years ago. His smoking use included cigarettes. He has a 7.50 pack-year smoking history. He has never been exposed to tobacco smoke. He has never used smokeless tobacco. He reports that he does not currently use alcohol. He reports current drug use. Drug: Marijuana.  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   Right SFA 05/07/17 while on warfarin INR 2.9   Atrial fibrillation (HCC)    Benign essential HTN 01/04/2012   Chronic anticoagulation 01/02/2013   Dermatofibroma of forearm 01/02/2013   Left side   DVT, lower extremity, proximal (HCC) 05/24/2013   Right leg femoral & popliteal 05/24/13   Hyperlipidemia, mixed 01/04/2012   Hypothyroidism    Pneumonia    Polycythemia secondary to smoking 01/04/2012   Primary hypercoagulable state (HCC) 10/01/2014   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HCC)    Sinus bradycardia, chronic 01/04/2012   Superficial thrombosis of lower extremity 05/02/2012    ALLERGIES:  has No Known Allergies.  MEDICATIONS:  Current Outpatient Medications  Medication Sig Dispense Refill   acetaminophen (TYLENOL) 325 MG tablet Take 2 tablets (650 mg total) by mouth every 6 (six) hours.     amoxicillin-clavulanate (AUGMENTIN) 875-125 MG tablet Take 1 tablet by mouth 2 (two) times daily. 14 tablet 0   aspirin EC 81 MG EC tablet Take 1 tablet (81 mg total) by mouth daily. Swallow whole. 30 tablet 11   atorvastatin (LIPITOR) 20 MG tablet TAKE 1 TABLET BY MOUTH EVERY DAY 90 tablet 3   cyclobenzaprine (FLEXERIL) 10 MG tablet Take 1 tablet (10 mg total) by mouth 3 (three) times daily as needed for muscle spasms. 90 tablet 0   gabapentin (NEURONTIN) 100 MG  capsule Take by mouth.     ibuprofen (ADVIL) 200 MG tablet Take 200 mg by mouth every 6 (six) hours as needed.     levothyroxine (SYNTHROID) 25 MCG tablet Take 1 tablet (25 mcg total) by mouth daily before breakfast. Take on empty stomach w/ water, 1 hour before other meds or food. 90 tablet 0   LORazepam (ATIVAN) 0.5  MG tablet Take 1 tablet (0.5 mg total) by mouth at bedtime. 30 tablet 0   metoprolol succinate (TOPROL-XL) 25 MG 24 hr tablet TAKE 1 TABLET (25 MG TOTAL) BY MOUTH DAILY. 90 tablet 3   oxyCODONE (OXY IR/ROXICODONE) 5 MG immediate release tablet Take 1 tablet (5 mg total) by mouth every 6 (six) hours as needed for severe pain. 90 tablet 0   pantoprazole (PROTONIX) 40 MG tablet Take 1 tablet (40 mg total) by mouth 2 (two) times daily. 60 tablet 1   polyethylene glycol (MIRALAX / GLYCOLAX) 17 g packet Take 17 g by mouth 2 (two) times daily. 14 each 0   rivaroxaban (XARELTO) 20 MG TABS tablet Take 1 tablet (20 mg total) by mouth daily with supper. 30 tablet 3   No current facility-administered medications for this visit.    VITAL SIGNS: There were no vitals taken for this visit. There were no vitals filed for this visit.  Estimated body mass index is 33.09 kg/m as calculated from the following:   Height as of 11/17/22: 6' (1.829 m).   Weight as of 11/17/22: 244 lb (110.7 kg).   PERFORMANCE STATUS (ECOG) : 1 - Symptomatic but completely ambulatory  Assessment NAD RRR Ambulatory without assistive devices AAO x4  IMPRESSION:    Pain related to radionecrosis [name] [name] reports pain is well controlled on current regimen. He is currently taking oxycodone 5 mg every 6 hours as needed for pain, Advil in between, and flexeril as needed for spasms. Does not require oxycodone daily. We will continue current regimen with close follow-up.  Taking as directed. Refills appropriate past due indicating patient is taking responsibly.   I discussed the importance of continued conversation with family and their medical providers regarding overall plan of care and treatment options, ensuring decisions are within the context of the patients values and GOCs.  PLAN: Oxycodone as needed for pain. Not requiring daily Flexeril as needed for muscle spasms We will continue to closely monitor and assist with symptom  management. I will plan to see patient back in 4 to 6 weeks.  Patient expressed understanding and was in agreement with this plan. He also understands that He can call the clinic at any time with any questions, concerns, or complaints.      Any controlled substances utilized were prescribed in the context of palliative care. PDMP has been reviewed.    Visit consisted of counseling and education dealing with the complex and emotionally intense issues of symptom management and palliative care in the setting of serious and potentially life-threatening illness.Greater than 50%  of this time was spent counseling and coordinating care related to the above assessment and plan.  Willette Alma, AGPCNP-BC  Palliative Medicine Team/[facility]  *Please note that this is a verbal dictation therefore any spelling or grammatical errors are due to the "Dragon Medical One" system interpretation.

## 2023-01-13 ENCOUNTER — Telehealth: Payer: Medicaid Other

## 2023-01-13 ENCOUNTER — Inpatient Hospital Stay: Payer: Medicaid Other | Attending: Hematology and Oncology | Admitting: Nurse Practitioner

## 2023-01-14 ENCOUNTER — Encounter: Payer: Self-pay | Admitting: Hematology

## 2023-01-14 NOTE — Progress Notes (Signed)
Pt did not answer phone for televisit

## 2023-01-15 ENCOUNTER — Other Ambulatory Visit: Payer: Self-pay

## 2023-01-15 DIAGNOSIS — Y842 Radiological procedure and radiotherapy as the cause of abnormal reaction of the patient, or of later complication, without mention of misadventure at the time of the procedure: Secondary | ICD-10-CM

## 2023-01-15 DIAGNOSIS — G893 Neoplasm related pain (acute) (chronic): Secondary | ICD-10-CM

## 2023-01-15 DIAGNOSIS — Z515 Encounter for palliative care: Secondary | ICD-10-CM

## 2023-01-15 MED ORDER — OXYCODONE HCL 5 MG PO TABS
5.0000 mg | ORAL_TABLET | Freq: Four times a day (QID) | ORAL | 0 refills | Status: DC | PRN
Start: 2023-01-15 — End: 2023-02-18

## 2023-01-15 NOTE — Telephone Encounter (Signed)
Patient called for a refill of oxycodone. When asked about missed palliative appt pt reported that he did not receive a call. Message sent to scheduler to r/s

## 2023-01-18 ENCOUNTER — Telehealth: Payer: Self-pay | Admitting: Hematology and Oncology

## 2023-01-28 ENCOUNTER — Other Ambulatory Visit: Payer: Self-pay | Admitting: Hematology and Oncology

## 2023-01-28 DIAGNOSIS — C090 Malignant neoplasm of tonsillar fossa: Secondary | ICD-10-CM

## 2023-02-04 ENCOUNTER — Telehealth: Payer: Self-pay | Admitting: *Deleted

## 2023-02-04 NOTE — Telephone Encounter (Signed)
Received call from pt requesting antibiotics for tooth abscess. Pt has been seen recently by oral surgeon  @ [facility] for this issue and will be having teeth extracted. Advised that he needs to contact the oral surgeon for this. Confirmed this with Dr. Leonides Schanz. Pt voiced understanding

## 2023-02-05 DIAGNOSIS — K029 Dental caries, unspecified: Secondary | ICD-10-CM | POA: Diagnosis not present

## 2023-02-05 DIAGNOSIS — K047 Periapical abscess without sinus: Secondary | ICD-10-CM | POA: Diagnosis not present

## 2023-02-17 NOTE — Progress Notes (Signed)
Palliative Medicine [facility]  Telephone:(336) [phone] Fax:(336) [phone]   Name: [name] [name] Date: 02/17/2023 MRN: [id number]  DOB: July 11, 1961  Patient Care Team: System, Provider Not In as PCP - General Cyndie Chime Genene Churn, MD as Consulting Physician (Oncology) Newman Pies, MD as Consulting Physician (Otolaryngology) Lonie Peak, MD as Attending Physician (Radiation Oncology) Arthur Holms, MD (Inactive) as Consulting Physician (Hematology) Barrie Folk, RN (Inactive) as Oncology Nurse Navigator Pickenpack-Cousar, Arty Baumgartner, NP as Nurse Practitioner (Nurse Practitioner)   INTERVAL HISTORY: [name] [name] is a 62 y.o. 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 Palliative ask to see for symptom management.    SOCIAL HISTORY:     reports that he quit smoking about 4 years ago. His smoking use included cigarettes. He started smoking about 34 years ago. He has a 7.5 pack-year smoking history. He has never been exposed to tobacco smoke. He has never used smokeless tobacco. He reports that he does not currently use alcohol. He reports current drug use. Drug: Marijuana.  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   Right SFA 05/07/17 while on warfarin INR 2.9   Atrial fibrillation (HCC)    Benign essential HTN 01/04/2012   Chronic anticoagulation 01/02/2013   Dermatofibroma of forearm 01/02/2013   Left side   DVT, lower extremity, proximal (HCC) 05/24/2013   Right leg femoral & popliteal 05/24/13   Hyperlipidemia, mixed 01/04/2012    Hypothyroidism    Pneumonia    Polycythemia secondary to smoking 01/04/2012   Primary hypercoagulable state (HCC) 10/01/2014   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HCC)    Sinus bradycardia, chronic 01/04/2012   Superficial thrombosis of lower extremity 05/02/2012    ALLERGIES:  has No Known Allergies.  MEDICATIONS:  Current Outpatient Medications  Medication Sig Dispense Refill   acetaminophen (TYLENOL) 325 MG tablet Take 2 tablets (650 mg total) by mouth every 6 (six) hours.     amoxicillin-clavulanate (AUGMENTIN) 875-125 MG tablet Take 1 tablet by mouth 2 (two) times daily. 14 tablet 0   aspirin EC 81 MG EC tablet Take 1 tablet (81 mg total) by mouth daily. Swallow whole. 30 tablet 11   atorvastatin (LIPITOR) 20 MG tablet TAKE 1 TABLET BY MOUTH EVERY DAY 90 tablet 3   cyclobenzaprine (FLEXERIL) 10 MG tablet Take 1 tablet (10 mg total) by mouth 3 (three) times daily as needed for muscle spasms. 90 tablet 0   gabapentin (NEURONTIN) 100 MG capsule Take by mouth.     ibuprofen (ADVIL) 200 MG tablet Take 200 mg by mouth every 6 (six) hours as needed.     levothyroxine (SYNTHROID) 25 MCG tablet TAKE 1 TABLET (25 MCG TOTAL) BY MOUTH DAILY BEFORE BREAKFAST. TAKE ON EMPTY STOMACH W/ WATER, 1 HOUR BEFORE OTHER MEDS OR FOOD. 90 tablet 0   LORazepam (ATIVAN) 0.5 MG tablet Take 1 tablet (0.5 mg total) by mouth at bedtime. 30 tablet 0   metoprolol succinate (TOPROL-XL) 25 MG 24 hr tablet TAKE 1 TABLET (25 MG TOTAL) BY MOUTH DAILY. 90 tablet 3   oxyCODONE (OXY IR/ROXICODONE) 5  MG immediate release tablet Take 1 tablet (5 mg total) by mouth every 6 (six) hours as needed for severe pain. 90 tablet 0   pantoprazole (PROTONIX) 40 MG tablet Take 1 tablet (40 mg total) by mouth 2 (two) times daily. 60 tablet 1   polyethylene glycol (MIRALAX / GLYCOLAX) 17 g packet Take 17 g by mouth 2 (two) times daily. 14 each 0   rivaroxaban (XARELTO) 20 MG TABS tablet Take 1 tablet (20 mg total) by mouth daily with  supper. 30 tablet 3   No current facility-administered medications for this visit.    VITAL SIGNS: There were no vitals taken for this visit. There were no vitals filed for this visit.  Estimated body mass index is 33.09 kg/m as calculated from the following:   Height as of 11/17/22: 6' (1.829 m).   Weight as of 11/17/22: 244 lb (110.7 kg).   PERFORMANCE STATUS (ECOG) : 1 - Symptomatic but completely ambulatory  Assessment NAD Difficulty opening mouth wide, tooth decay, s/p surgical changes RRR Right ankle tenderness, mild edema, redness Ambulatory without assistive devices AAO x4  IMPRESSION:  [name] [name] presents to clinic today for follow-up. 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 Known dental involvement. States he has not been seen by oral surgeon as requested by Dr. Mauri Pole @ [facility]. He was seen by Dr. Leonides Schanz today who also advised to follow-up with his team there and oral surgery to continue with care needed.   [name] [name] is complaining of right ankle tenderness and redness. Of note he has not seen his PCP since January 2023. Recommended contacting their office for follow-up and annual physical. He has contact information for Dr. Jannet Askew team and IM provider to schedule appointments. We discussed this will be most appropriate for his well being and to manage any ongoing symptoms or concerns. He verbalized understanding.   Overall Mr. Obst is doing as well as expected. Is remaining active. Nutritional intake is limited due to oral condition. Denies nausea, vomiting, constipation, or diarrhea.    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 He is currently taking oxycodone 5 mg every 6 hours as needed for pain, Advil in between, and flexeril as needed for spasms. Does not require oxycodone daily. We will continue current regimen with close follow-up.  Taking as directed. Refills  appropriate past due indicating patient is taking responsibly. Last filled June 28th. Will refill today. Again advised importance of following up with his [facility] team and oral surgeon to aggressively manage allowing for decrease symptoms in the future.   We will continue to closely monitor and support as needed.   PLAN: Oxycodone as needed for pain. Not requiring daily Flexeril as needed for muscle spasms We will continue to closely monitor and assist with symptom management. I will plan to see patient back in 4 to 6 weeks.  Patient expressed understanding and was in agreement with this plan. He also understands that He can call the clinic at any time with any questions, concerns, or complaints.      Any controlled substances utilized were prescribed in the context of palliative care. PDMP has been reviewed.    Visit consisted of counseling and education dealing with the complex and emotionally intense issues of symptom management and palliative care in the setting of serious and potentially life-threatening illness.Greater than 50%  of this time was spent counseling and coordinating care related to the  above assessment and plan.  Willette Alma, AGPCNP-BC  Palliative Medicine Team/[facility]  *Please note that this is a verbal dictation therefore any spelling or grammatical errors are due to the "Dragon Medical One" system interpretation.

## 2023-02-18 ENCOUNTER — Inpatient Hospital Stay (HOSPITAL_BASED_OUTPATIENT_CLINIC_OR_DEPARTMENT_OTHER): Payer: Medicaid Other | Admitting: Nurse Practitioner

## 2023-02-18 ENCOUNTER — Other Ambulatory Visit: Payer: Self-pay | Admitting: Hematology and Oncology

## 2023-02-18 ENCOUNTER — Inpatient Hospital Stay: Payer: Medicaid Other | Attending: Hematology and Oncology

## 2023-02-18 ENCOUNTER — Encounter: Payer: Self-pay | Admitting: Nurse Practitioner

## 2023-02-18 ENCOUNTER — Other Ambulatory Visit: Payer: Self-pay

## 2023-02-18 ENCOUNTER — Inpatient Hospital Stay (HOSPITAL_BASED_OUTPATIENT_CLINIC_OR_DEPARTMENT_OTHER): Payer: Medicaid Other | Admitting: Hematology and Oncology

## 2023-02-18 VITALS — BP 120/87 | HR 75 | Temp 97.8°F | Resp 16 | Wt 237.3 lb

## 2023-02-18 DIAGNOSIS — Y842 Radiological procedure and radiotherapy as the cause of abnormal reaction of the patient, or of later complication, without mention of misadventure at the time of the procedure: Secondary | ICD-10-CM

## 2023-02-18 DIAGNOSIS — L598 Other specified disorders of the skin and subcutaneous tissue related to radiation: Secondary | ICD-10-CM

## 2023-02-18 DIAGNOSIS — G893 Neoplasm related pain (acute) (chronic): Secondary | ICD-10-CM

## 2023-02-18 DIAGNOSIS — Z85818 Personal history of malignant neoplasm of other sites of lip, oral cavity, and pharynx: Secondary | ICD-10-CM | POA: Diagnosis not present

## 2023-02-18 DIAGNOSIS — Z515 Encounter for palliative care: Secondary | ICD-10-CM

## 2023-02-18 DIAGNOSIS — Z7901 Long term (current) use of anticoagulants: Secondary | ICD-10-CM | POA: Diagnosis not present

## 2023-02-18 DIAGNOSIS — Z87891 Personal history of nicotine dependence: Secondary | ICD-10-CM | POA: Diagnosis not present

## 2023-02-18 DIAGNOSIS — C090 Malignant neoplasm of tonsillar fossa: Secondary | ICD-10-CM

## 2023-02-18 DIAGNOSIS — Z0000 Encounter for general adult medical examination without abnormal findings: Secondary | ICD-10-CM | POA: Diagnosis not present

## 2023-02-18 DIAGNOSIS — Z86718 Personal history of other venous thrombosis and embolism: Secondary | ICD-10-CM | POA: Diagnosis not present

## 2023-02-18 LAB — CMP (CANCER CENTER ONLY)
ALT: 18 U/L (ref 0–44)
AST: 26 U/L (ref 15–41)
Albumin: 4.2 g/dL (ref 3.5–5.0)
Alkaline Phosphatase: 110 U/L (ref 38–126)
Anion gap: 9 (ref 5–15)
BUN: 9 mg/dL (ref 8–23)
CO2: 29 mmol/L (ref 22–32)
Calcium: 9.9 mg/dL (ref 8.9–10.3)
Chloride: 103 mmol/L (ref 98–111)
Creatinine: 1.04 mg/dL (ref 0.61–1.24)
GFR, Estimated: >60 mL/min (ref >60)
Glucose, Bld: 100 mg/dL — ABNORMAL HIGH (ref 70–99)
Potassium: 4.3 mmol/L (ref 3.5–5.1)
Sodium: 141 mmol/L (ref 135–145)
Total Bilirubin: 0.8 mg/dL (ref 0.3–1.2)
Total Protein: 7.2 g/dL (ref 6.5–8.1)

## 2023-02-18 LAB — CBC WITH DIFFERENTIAL (CANCER CENTER ONLY)
Abs Immature Granulocytes: 0.03 10*3/uL (ref 0.00–0.07)
Basophils Absolute: 0.1 10*3/uL (ref 0.0–0.1)
Basophils Relative: 1 %
Eosinophils Absolute: 0.2 10*3/uL (ref 0.0–0.5)
Eosinophils Relative: 2 %
HCT: 48.3 % (ref 39.0–52.0)
Hemoglobin: 16.8 g/dL (ref 13.0–17.0)
Immature Granulocytes: 0 %
Lymphocytes Relative: 16 %
Lymphs Abs: 1.3 10*3/uL (ref 0.7–4.0)
MCH: 32.4 pg (ref 26.0–34.0)
MCHC: 34.8 g/dL (ref 30.0–36.0)
MCV: 93.2 fL (ref 80.0–100.0)
Monocytes Absolute: 0.8 10*3/uL (ref 0.1–1.0)
Monocytes Relative: 10 %
Neutro Abs: 5.7 10*3/uL (ref 1.7–7.7)
Neutrophils Relative %: 71 %
Platelet Count: 206 10*3/uL (ref 150–400)
RBC: 5.18 MIL/uL (ref 4.22–5.81)
RDW: 13.6 % (ref 11.5–15.5)
WBC Count: 8 10*3/uL (ref 4.0–10.5)
nRBC: 0 % (ref 0.0–0.2)

## 2023-02-18 LAB — LACTATE DEHYDROGENASE: LDH: 121 U/L (ref 98–192)

## 2023-02-18 MED ORDER — OXYCODONE HCL 5 MG PO TABS
5.0000 mg | ORAL_TABLET | Freq: Four times a day (QID) | ORAL | 0 refills | Status: DC | PRN
Start: 2023-02-18 — End: 2023-03-25

## 2023-02-18 MED ORDER — RIVAROXABAN 20 MG PO TABS: 20.0000 mg | ORAL_TABLET | Freq: Every day | ORAL | 2 refills | Status: DC

## 2023-02-18 NOTE — Progress Notes (Signed)
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 Suszanne Conners, invasive SCCa, p16+; not a candidate for TORS -Late 10/2018 - 12/2018: definitive chemoradiation with weekly cisplatin  03/2019: end-of-treatment PET showed decrease in FDG avidity in the left tonsil but some residual soft tissue fullness, resolution of left LN disease   -2021:  Diffuse pharyngeal soft tissue thickening at the tongue base on multiple CT's  Necrotic tissue on multiple biopsies at [facility], suggestive of radionecrosis; no evidence of malignancy    #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  Started on Lovenox 1 mg/kg every 12 hours. 02/21/2021: Transition to Xarelto therapy.  Interval History:  [name] [name] 62 y.o. male returns for a follow up for history of recurrent VTE and history of stage 1 SCC of tonsil. He was last seen by Dr. Leonides Schanz on 09/08/2022. He is unaccompanied for this visit.   [name] reports in the interim since her last visit he saw an oral surgeon/dentist at [facility].  He reports he has not heard back from them that they are planning to extract teeth.  They also are going to "clear the gunk out of my mouth".  He notes that he has an abdominal hernia which has been present for approximately 4 years.  He notes it causes him discomfort and he is afraid "my skin lower than my intestines will fall out".  He notes that this developed during his surgery he had with Dr. Lenell Antu.  He notes his energy has been pretty good and his appetite unfortunately still liquid diet because he cannot open his mouth that far.  He notes that the dentist tried to open his mouth but it was very painful.  He notes that he continues taking his Xarelto therapy but has not had any bleeding, bruising, or dark stools.. He denies any fevers, chills, night sweats, shortness of breath, chest pain or cough. He has no other complaints. A full 10 point ROS is listed below.  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  Right SFA 05/07/17 while on warfarin INR 2.9   Atrial fibrillation (HCC)    Benign essential HTN 01/04/2012   Chronic anticoagulation 01/02/2013   Dermatofibroma of forearm 01/02/2013   Left side   DVT, lower extremity, proximal (HCC) 05/24/2013   Right leg femoral & popliteal 05/24/13    Hyperlipidemia, mixed 01/04/2012   Hypothyroidism    Pneumonia    Polycythemia secondary to smoking 01/04/2012   Primary hypercoagulable state (HCC) 10/01/2014   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HCC)    Sinus bradycardia, chronic 01/04/2012   Superficial thrombosis of lower extremity 05/02/2012    SURGICAL HISTORY: Past Surgical History:  Procedure Laterality Date   ABDOMINAL AORTOGRAM W/LOWER EXTREMITY Right 11/10/2021   Procedure: ABDOMINAL AORTOGRAM W/LOWER EXTREMITY;  Surgeon: Maeola Harman, MD;  Location: [facility] INVASIVE CV LAB;  Service: Cardiovascular;  Laterality: Right;   AORTA - BILATERAL FEMORAL ARTERY BYPASS GRAFT Bilateral 11/26/2021   Procedure: AORTO- BIILIAC ARTERY  BYPASS GRAFT USING HEMASHIELD 20 X , RIGHT ILIO-FEMORAL BYPASS  UMBILICAL HERNIA REPAIR;  Surgeon: Leonie Douglas, MD;  Location: [facility] OR;  Service: Vascular;  Laterality: Bilateral;   APPLICATION OF WOUND VAC Right 11/26/2021   Procedure: APPLICATION OF WOUND VAC;  Surgeon: Leonie Douglas, MD;  Location: [facility] OR;  Service: Vascular;  Laterality: Right;   DIRECT LARYNGOSCOPY Left 10/19/2018   Procedure: DIRECT LARYNGOSCOPY;  Surgeon: Newman Pies, MD;  Location: [facility];  Service: ENT;  Laterality: Left;   FEMORAL-TIBIAL BYPASS GRAFT Right 11/26/2021   Procedure: RIGHT FEMORALTIBIAL  ARTERY BYPASS GRAFT USING PROPATEN GRAFT;  Surgeon: Leonie Douglas, MD;  Location: [facility] OR;  Service: Vascular;  Laterality: Right;   IR IMAGING GUIDED PORT INSERTION  11/04/2018   IR REMOVAL TUN ACCESS W/ PORT W/O FL MOD SED  05/01/2019   TONSILLECTOMY Left 10/19/2018   Procedure: BIOPSY OF LEFT TONSIL;  Surgeon: Newman Pies, MD;  Location: [facility];  Service: ENT;  Laterality: Left;    SOCIAL HISTORY: Social History   Socioeconomic History   Marital status: Divorced    Spouse name: Not on file   Number of children: 2   Years of education: Not on file   Highest education level: Not  on file  Occupational History   Not on file  Tobacco Use   Smoking status: Former    Current packs/day: 0.00    Average packs/day: 0.3 packs/day for 30.0 years (7.5 ttl pk-yrs)    Types: Cigarettes    Start date: 10/18/1988    Quit date: 10/19/2018    Years since quitting: 4.3    Passive exposure: Never (1 a week)   Smokeless tobacco: Never  Vaping Use   Vaping status: Former   Substances: Nicotine, Flavoring   Devices: 1 cartridge q2-3 days, he tells me he is not using.   Substance and Sexual Activity   Alcohol use: Not Currently    Comment: 6 drinks per week, per patient   Drug use: Yes    Types: Marijuana   Sexual activity: Not on file  Other Topics Concern   Not on file  Social History Narrative   Not on file   Social Determinants of Health   Financial Resource Strain: Not on file  Food Insecurity: Not on file  Transportation Needs: No Transportation Needs (10/21/2018)   PRAPARE - Administrator, Civil Service (Medical): No    Lack of Transportation (Non-Medical): No  Physical Activity: Not on file  Stress: Not on file  Social Connections: Not on file  Intimate Partner Violence: Not At Risk (10/21/2018)   Humiliation, Afraid, Rape, and Kick questionnaire    Fear of Current or Ex-Partner: No    Emotionally Abused: No    Physically Abused: No    Sexually Abused: No    FAMILY HISTORY: Family History  Problem Relation Age of Onset   Stroke Father     ALLERGIES:  has No Known Allergies.  MEDICATIONS:  Current Outpatient Medications  Medication Sig Dispense Refill   acetaminophen (TYLENOL) 325 MG tablet Take 2 tablets (650 mg total) by mouth every 6 (six) hours.     amoxicillin-clavulanate (AUGMENTIN) 875-125 MG tablet Take 1 tablet by mouth 2 (two) times daily. 14 tablet 0   aspirin EC 81 MG EC tablet Take 1 tablet (81 mg total) by mouth daily. Swallow whole. 30 tablet 11   atorvastatin (LIPITOR) 20 MG tablet TAKE 1 TABLET BY MOUTH EVERY DAY 90 tablet 3    cyclobenzaprine (FLEXERIL) 10 MG tablet Take 1 tablet (10 mg total) by mouth 3 (three) times daily as needed for muscle spasms. 90 tablet 0   gabapentin (NEURONTIN) 100 MG capsule Take by mouth.     ibuprofen (ADVIL) 200 MG tablet Take 200 mg by mouth every 6 (six) hours as needed.     levothyroxine (SYNTHROID) 25 MCG tablet TAKE 1 TABLET (25 MCG TOTAL) BY MOUTH DAILY BEFORE BREAKFAST. TAKE ON EMPTY STOMACH W/ WATER, 1 HOUR BEFORE OTHER MEDS OR FOOD. 90 tablet 0   LORazepam (ATIVAN) 0.5 MG tablet Take 1 tablet (0.5 mg total) by mouth at bedtime. 30 tablet 0   metoprolol succinate (TOPROL-XL) 25 MG 24 hr tablet TAKE 1 TABLET (25 MG TOTAL) BY MOUTH DAILY. 90 tablet 3   oxyCODONE (OXY IR/ROXICODONE) 5 MG immediate release tablet Take 1 tablet (5 mg total) by mouth every 6 (six) hours as needed for severe pain. 90 tablet 0   pantoprazole (PROTONIX) 40 MG tablet Take 1 tablet (40 mg total) by mouth 2 (two) times daily. 60 tablet 1   polyethylene glycol (MIRALAX / GLYCOLAX) 17 g packet Take 17 g by mouth 2 (two) times daily. 14 each 0   rivaroxaban (XARELTO) 20 MG TABS tablet Take 1 tablet (20 mg total) by mouth daily with supper. 30 tablet 3   No current facility-administered medications for this visit.    REVIEW OF SYSTEMS:   Constitutional: ( - ) fevers, ( - )  chills , ( - ) night sweats Eyes: ( - ) blurriness of vision, ( - ) double vision, ( - ) watery eyes Ears, nose, mouth, throat, and face: ( - ) mucositis, ( - ) sore throat Respiratory: ( - ) cough, ( - ) dyspnea, ( - ) wheezes Cardiovascular: ( - ) palpitation, ( - ) chest discomfort, ( - ) lower extremity swelling Gastrointestinal:  ( - ) nausea, ( - ) heartburn, ( - ) change in bowel habits Skin: ( - ) abnormal skin rashes Lymphatics: ( - ) new lymphadenopathy, ( - ) easy bruising Neurological: ( - ) numbness, ( - ) tingling, ( - ) new weaknesses Behavioral/Psych: ( - ) mood change, ( - ) new changes  All other systems were  reviewed with the patient and are negative.  PHYSICAL EXAMINATION: ECOG PERFORMANCE STATUS: 1 - Symptomatic but completely ambulatory  There were no vitals filed for this visit.  There were no vitals filed for this visit.  GENERAL: well appearing middle aged Caucasian male alert, no distress and comfortable SKIN: skin color, texture, turgor are normal, no rashes or significant lesions EYES: conjunctiva are pink and non-injected, sclera clear OROPHARYNX: exam limited as patient cannot open his mouth wide. Thick secretions note.  LUNGS: clear to auscultation and percussion with normal breathing effort HEART: regular rate & rhythm and no murmurs and no lower extremity edema PSYCH: alert & oriented x 3, fluent speech NEURO: no focal motor/sensory deficits  LABORATORY DATA:  I have reviewed the data as listed    Latest Ref Rng & Units 09/08/2022   12:56 PM 02/26/2022    2:06 PM 12/08/2021    6:37 AM  CBC  WBC 4.0 - 10.5 K/uL 5.6  5.8  7.3   Hemoglobin 13.0 - 17.0 g/dL 16.1  09.6  04.5   Hematocrit 39.0 - 52.0 % 47.6  46.0  34.6   Platelets 150 - 400 K/uL 208  204  277        Latest Ref Rng & Units 09/08/2022   12:56 PM 02/26/2022    2:06 PM 12/08/2021    6:37 AM  CMP  Glucose 70 - 99 mg/dL 409  98  97   BUN 8 - 23 mg/dL 10  10  7    Creatinine 0.61 - 1.24 mg/dL 8.11  9.14  7.82   Sodium 135 - 145 mmol/L 142  139  139   Potassium 3.5 - 5.1 mmol/L 4.0  4.1  3.4   Chloride 98 - 111 mmol/L 104  106  106   CO2 22 - 32 mmol/L 30  27  25    Calcium 8.9 - 10.3 mg/dL 9.4  9.6  8.6   Total Protein 6.5 - 8.1 g/dL 7.0  7.2    Total Bilirubin 0.3 - 1.2 mg/dL 0.6  0.5    Alkaline Phos 38 - 126 U/L 96  92    AST 15 - 41 U/L 21  14    ALT 0 - 44 U/L 15  10      RADIOGRAPHIC STUDIES: No results found.   ASSESSMENT & PLAN EASON HOUSMAN 62 y.o. male with medical history significant for squamous cell carcinoma of left tonsil presents for a follow up visit.    #Stage I (cT2N1M0) squamous  cell carcinoma of left tonsil, p16+  -Received definitive chemoradiation with weekly cisplatin from 10/2018 - 12/2018.Currently on close surveillance -Under the care of Dr. Kathrin Greathouse, ENT at [facility] -Multiple discussions were held with radiation oncology and ENT at [facility].  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 Patient holding on that plan at this time.  --Patient was referred to [facility] ENT for further evaluation. He saw Dr. Gordan Payment on 04/24/2022 who recommended segmental mandibulectomy with left scap tip free flap.   #Hypothyroidisim, radiation induced --current on levothyroxine 25 mcg PO daily.  --TSH 12.819, Free T4 normal. --Recommend to continue current dose of levothyroxine.  #Recurrent LLE DVT #Acute Right Upper Extremity DVT/SVT --patient had a midline placed for abx, developed a subsequent RUE DVT --this is considered provoked in the setting  of a catheter, though patient is prone to clotting. --previously on therapeutic 1mg /kg lovenox x 3 months since May 2022. HOLD coumadin. -- Recommended the patient not return to Coumadin therapy --continue Xarelto 20mg  PO daily.  --Labs today show white blood cell count 8.0, hemoglobin 16.8, MCV 93.2, and platelets of 206 --Patient requires indefinite anticoagulation   #Pain Control --Under the care of palliative care NP, Nikki --Regimen includes oxycodone 5mg  q6H PRN and Flexeril 10 mg TID PRN.    Follow up: --Labs and follow up in 6 months No orders of the defined types were placed in this  encounter.   All questions were answered. The patient knows to call the clinic with any problems, questions or concerns.  I have spent a total of 30 minutes minutes of face-to-face and non-face-to-face time, preparing to see the patient, performing a medically appropriate examination, counseling and educating the patient, ordering meds, documenting clinical information in the electronic health record, and care coordination.   Ulysees Barns, MD Department of Hematology/Oncology [facility] at [facility] Phone: [phone] Pager: [phone] Email: [email or web address]

## 2023-02-19 ENCOUNTER — Encounter: Payer: Self-pay | Admitting: Hematology

## 2023-02-19 ENCOUNTER — Other Ambulatory Visit: Payer: Self-pay

## 2023-02-19 ENCOUNTER — Telehealth: Payer: Self-pay | Admitting: Hematology and Oncology

## 2023-02-19 DIAGNOSIS — Z0000 Encounter for general adult medical examination without abnormal findings: Secondary | ICD-10-CM

## 2023-02-19 LAB — T4, FREE: Free T4: 0.93 ng/dL (ref 0.61–1.12)

## 2023-02-21 ENCOUNTER — Encounter: Payer: Self-pay | Admitting: Hematology

## 2023-02-28 ENCOUNTER — Other Ambulatory Visit: Payer: Self-pay | Admitting: Student

## 2023-02-28 DIAGNOSIS — Z7189 Other specified counseling: Secondary | ICD-10-CM

## 2023-03-01 ENCOUNTER — Other Ambulatory Visit: Payer: Self-pay | Admitting: *Deleted

## 2023-03-01 DIAGNOSIS — Z7189 Other specified counseling: Secondary | ICD-10-CM

## 2023-03-01 MED ORDER — METOPROLOL SUCCINATE ER 25 MG PO TB24
25.0000 mg | ORAL_TABLET | Freq: Every day | ORAL | 0 refills | Status: DC
Start: 2023-03-01 — End: 2023-05-12

## 2023-03-01 NOTE — Telephone Encounter (Signed)
Received call from pt requesting a refill of his metoprolol. Advised that his PCP is the one to fill that. Pt states he has not seen his PCP in years.  Gave pt # to [facility] in [place] and [place] to schedule an appt . Advised I will send in refill once and then he needs to have his new PCP take over this refill. Pt voiced understanding.

## 2023-03-24 NOTE — Progress Notes (Signed)
Palliative Medicine [facility]  Telephone:(336) [phone] Fax:(336) [phone]   Name: [name] [name] Date: 03/24/2023 MRN: [id number]  DOB: 1960/07/25  Patient Care Team: System, Provider Not In as PCP - General [name] Chime Genene Churn, MD as Consulting Physician (Oncology) [name] Pies, MD as Consulting Physician (Otolaryngology) [name] Peak, MD as Attending Physician (Radiation Oncology) [name] Holms, MD (Inactive) as Consulting Physician (Hematology) [name] Folk, RN (Inactive) as Oncology Nurse Navigator [name] [name], [name] Baumgartner, NP as Nurse Practitioner (Nurse Practitioner)   I connected with [name] [name] on 03/24/23 at 11:00 AM EDT by phone and verified that I am speaking with the correct person using two identifiers.   I discussed the limitations, risks, security and privacy concerns of performing an evaluation and management service by telemedicine and the availability of in-person appointments. I also discussed with the patient that there may be a patient responsible charge related to this service. The patient expressed understanding and agreed to proceed.   Other persons participating in the visit and their role in the encounter:n /a   Patient's location: home  Provider's location: [facility]   Chief Complaint: f/u of symptom management    INTERVAL HISTORY: [name] [name] is a 62 y.o. 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 Palliative ask to see for symptom management.    SOCIAL HISTORY:     reports that he quit smoking about 4 years ago. His smoking use included cigarettes. He started smoking about 34 years ago. He has a 7.5 pack-year smoking history. He has never been exposed to tobacco smoke. He has never used smokeless tobacco. He reports that he does not currently use alcohol. He reports current drug use. Drug: Marijuana.  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   Right SFA 05/07/17 while on warfarin INR 2.9   Atrial fibrillation (HCC)    Benign essential HTN 01/04/2012   Chronic anticoagulation 01/02/2013   Dermatofibroma of forearm 01/02/2013   Left side   DVT, lower extremity, proximal (HCC) 05/24/2013   Right leg femoral & popliteal 05/24/13   Hyperlipidemia, mixed 01/04/2012   Hypothyroidism    Pneumonia    Polycythemia secondary to smoking 01/04/2012   Primary hypercoagulable state (HCC) 10/01/2014   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HCC)    Sinus bradycardia, chronic 01/04/2012   Superficial thrombosis of lower extremity 05/02/2012    ALLERGIES:  has No Known Allergies.  MEDICATIONS:  Current Outpatient Medications  Medication Sig Dispense Refill   acetaminophen (TYLENOL) 325 MG tablet Take 2 tablets (650 mg total) by mouth every 6 (six) hours.     amoxicillin-clavulanate (AUGMENTIN) 875-125 MG tablet Take 1 tablet by mouth 2 (two) times daily. 14 tablet 0   aspirin EC 81 MG EC tablet Take 1 tablet (81 mg total) by mouth daily. Swallow whole. 30 tablet 11   atorvastatin (LIPITOR) 20 MG tablet TAKE 1 TABLET BY MOUTH EVERY DAY 90 tablet 3   cyclobenzaprine (FLEXERIL) 10 MG tablet Take 1 tablet (10 mg total) by mouth 3 (three) times daily as needed for muscle spasms. 90 tablet 0  gabapentin (NEURONTIN) 100 MG capsule Take by mouth.     ibuprofen (ADVIL) 200 MG tablet Take 200 mg by mouth every 6 (six) hours as needed.     levothyroxine (SYNTHROID) 25 MCG tablet TAKE 1 TABLET (25 MCG TOTAL) BY MOUTH DAILY BEFORE BREAKFAST. TAKE ON EMPTY STOMACH W/ WATER, 1 HOUR BEFORE OTHER MEDS OR FOOD.  90 tablet 0   LORazepam (ATIVAN) 0.5 MG tablet Take 1 tablet (0.5 mg total) by mouth at bedtime. 30 tablet 0   metoprolol succinate (TOPROL-XL) 25 MG 24 hr tablet Take 1 tablet (25 mg total) by mouth daily. 90 tablet 0   oxyCODONE (OXY IR/ROXICODONE) 5 MG immediate release tablet Take 1 tablet (5 mg total) by mouth every 6 (six) hours as needed for severe pain. 90 tablet 0   pantoprazole (PROTONIX) 40 MG tablet Take 1 tablet (40 mg total) by mouth 2 (two) times daily. 60 tablet 1   polyethylene glycol (MIRALAX / GLYCOLAX) 17 g packet Take 17 g by mouth 2 (two) times daily. 14 each 0   rivaroxaban (XARELTO) 20 MG TABS tablet Take 1 tablet (20 mg total) by mouth daily with supper. 90 tablet 2   No current facility-administered medications for this visit.    VITAL SIGNS: There were no vitals taken for this visit. There were no vitals filed for this visit.  Estimated body mass index is 32.18 kg/m as calculated from the following:   Height as of 11/17/22: 6' (1.829 m).   Weight as of 02/18/23: 237 lb 4.8 oz (107.6 kg).   PERFORMANCE STATUS (ECOG) : 1 - Symptomatic but completely ambulatory   IMPRESSION:  I connected with Mr. Lorance by phone. No acute distress identified. Denies nausea, vomiting, constipation, or diarrhea. Appetite is fair. Some days better than others considering his overall condition. Reports plans to follow-up next week at [facility] for tooth extraction and other procedures that will require multiple visits.   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 He is currently taking oxycodone 5 mg every 6 hours as needed for pain, Advil in between, and flexeril as needed for spasms. Does not require oxycodone daily. We will continue current regimen with close follow-up.  Taking as directed. Refills appropriate past due indicating patient is taking responsibly.   We will continue to closely monitor and support as needed.   PLAN: Oxycodone as  needed for pain. Not requiring daily Flexeril as needed for muscle spasms We will continue to closely monitor and assist with symptom management. I will plan to see patient back in 6-8 weeks.  Patient expressed understanding and was in agreement with this plan. He also understands that He can call the clinic at any time with any questions, concerns, or complaints.      Any controlled substances utilized were prescribed in the context of palliative care. PDMP has been reviewed.    Visit consisted of counseling and education dealing with the complex and emotionally intense issues of symptom management and palliative care in the setting of serious and potentially life-threatening illness.Greater than 50%  of this time was spent counseling and coordinating care related to the above assessment and plan.  Willette Alma, AGPCNP-BC  Palliative Medicine Team/[facility]  *Please note that this is a verbal dictation therefore any spelling or grammatical errors are due to the "[employer]agon [employer] One" system interpretation.

## 2023-03-25 ENCOUNTER — Other Ambulatory Visit: Payer: Self-pay

## 2023-03-25 DIAGNOSIS — Y842 Radiological procedure and radiotherapy as the cause of abnormal reaction of the patient, or of later complication, without mention of misadventure at the time of the procedure: Secondary | ICD-10-CM

## 2023-03-25 DIAGNOSIS — Z515 Encounter for palliative care: Secondary | ICD-10-CM

## 2023-03-25 DIAGNOSIS — G893 Neoplasm related pain (acute) (chronic): Secondary | ICD-10-CM

## 2023-03-25 MED ORDER — OXYCODONE HCL 5 MG PO TABS
5.0000 mg | ORAL_TABLET | Freq: Four times a day (QID) | ORAL | 0 refills | Status: DC | PRN
Start: 2023-03-25 — End: 2023-04-26

## 2023-03-25 NOTE — Telephone Encounter (Signed)
Pt called for medication refill

## 2023-04-01 ENCOUNTER — Inpatient Hospital Stay: Payer: Medicaid Other | Attending: Hematology and Oncology | Admitting: Nurse Practitioner

## 2023-04-01 ENCOUNTER — Encounter: Payer: Self-pay | Admitting: Nurse Practitioner

## 2023-04-01 DIAGNOSIS — G893 Neoplasm related pain (acute) (chronic): Secondary | ICD-10-CM

## 2023-04-01 DIAGNOSIS — C090 Malignant neoplasm of tonsillar fossa: Secondary | ICD-10-CM

## 2023-04-01 DIAGNOSIS — Z515 Encounter for palliative care: Secondary | ICD-10-CM | POA: Diagnosis not present

## 2023-04-07 ENCOUNTER — Telehealth: Payer: Self-pay | Admitting: Radiation Oncology

## 2023-04-07 NOTE — Telephone Encounter (Signed)
9/18 @ 10:00 am patient's friend Lyna Poser called to speak to Dr. Basilio Cairo about needing a heat map from his radiation treatments.  He was last seen 10/08/20.  He was scheduled for teeth extraction at [facility] on today, but was postpone due to needing the heat map.  Patient would like a call back to know his next steps.  Secure chat sent to Zerita Boers, RN, so they are aware.

## 2023-04-26 ENCOUNTER — Other Ambulatory Visit: Payer: Self-pay | Admitting: Nurse Practitioner

## 2023-04-26 DIAGNOSIS — L598 Other specified disorders of the skin and subcutaneous tissue related to radiation: Secondary | ICD-10-CM

## 2023-04-26 DIAGNOSIS — Z515 Encounter for palliative care: Secondary | ICD-10-CM

## 2023-04-26 DIAGNOSIS — G893 Neoplasm related pain (acute) (chronic): Secondary | ICD-10-CM

## 2023-04-26 MED ORDER — OXYCODONE HCL 5 MG PO TABS: 5.0000 mg | ORAL_TABLET | Freq: Four times a day (QID) | ORAL | 0 refills | Status: DC | PRN

## 2023-04-27 ENCOUNTER — Telehealth: Payer: Self-pay

## 2023-04-27 NOTE — Telephone Encounter (Signed)
Pt LVM stating that he spoke with a palliative care team member regarding an insurance issue that occurred when picking up an oxycodone prescription from his pharmacy. This RN reached out to the palliative care RN, Maygan, who verified with this RN approval was made yesterday for the prescription.   This RN called pt back to inform him of the above message from the palliative care nurse. This RN informed pt to please call back if he needs anything else. Pt verbalized understanding.

## 2023-04-28 ENCOUNTER — Telehealth: Payer: Self-pay

## 2023-04-28 NOTE — Telephone Encounter (Signed)
Pt LVM stating that the prescription was filled but wanted to reach Athena from palliative due to him being unable to obtain the prescription because of an issue with insurance. Pt states he was told to call palliative care back if he experiences this issue. This RN reached out to palliative care RN to inform her of message.

## 2023-04-29 ENCOUNTER — Telehealth: Payer: Self-pay

## 2023-04-29 NOTE — Telephone Encounter (Signed)
This RN called and confirmed with pt pharmacy that oxycodone went though insurance, PA approved, pharmacy fill in progress.pt called and updated verbalized understanding, no further needs at this time.

## 2023-05-07 ENCOUNTER — Other Ambulatory Visit: Payer: Self-pay | Admitting: Hematology and Oncology

## 2023-05-07 DIAGNOSIS — C090 Malignant neoplasm of tonsillar fossa: Secondary | ICD-10-CM

## 2023-05-12 ENCOUNTER — Ambulatory Visit: Payer: Medicaid Other | Admitting: Nurse Practitioner

## 2023-05-12 VITALS — BP 124/88 | HR 90 | Temp 97.3°F | Ht 75.05 in | Wt 233.2 lb

## 2023-05-12 DIAGNOSIS — E039 Hypothyroidism, unspecified: Secondary | ICD-10-CM | POA: Diagnosis not present

## 2023-05-12 DIAGNOSIS — C090 Malignant neoplasm of tonsillar fossa: Secondary | ICD-10-CM | POA: Diagnosis not present

## 2023-05-12 DIAGNOSIS — I7121 Aneurysm of the ascending aorta, without rupture: Secondary | ICD-10-CM | POA: Diagnosis not present

## 2023-05-12 DIAGNOSIS — I739 Peripheral vascular disease, unspecified: Secondary | ICD-10-CM | POA: Diagnosis not present

## 2023-05-12 DIAGNOSIS — I10 Essential (primary) hypertension: Secondary | ICD-10-CM

## 2023-05-12 DIAGNOSIS — Z1211 Encounter for screening for malignant neoplasm of colon: Secondary | ICD-10-CM

## 2023-05-12 DIAGNOSIS — I480 Paroxysmal atrial fibrillation: Secondary | ICD-10-CM

## 2023-05-12 DIAGNOSIS — E785 Hyperlipidemia, unspecified: Secondary | ICD-10-CM | POA: Diagnosis not present

## 2023-05-12 DIAGNOSIS — Z7189 Other specified counseling: Secondary | ICD-10-CM

## 2023-05-12 DIAGNOSIS — E782 Mixed hyperlipidemia: Secondary | ICD-10-CM

## 2023-05-12 DIAGNOSIS — I700 Atherosclerosis of aorta: Secondary | ICD-10-CM | POA: Diagnosis not present

## 2023-05-12 DIAGNOSIS — Z87891 Personal history of nicotine dependence: Secondary | ICD-10-CM | POA: Diagnosis not present

## 2023-05-12 DIAGNOSIS — I82409 Acute embolism and thrombosis of unspecified deep veins of unspecified lower extremity: Secondary | ICD-10-CM | POA: Diagnosis not present

## 2023-05-12 DIAGNOSIS — Z125 Encounter for screening for malignant neoplasm of prostate: Secondary | ICD-10-CM

## 2023-05-12 MED ORDER — METOPROLOL SUCCINATE ER 25 MG PO TB24: 25.0000 mg | ORAL_TABLET | Freq: Every day | ORAL | 1 refills | Status: DC

## 2023-05-12 MED ORDER — ATORVASTATIN CALCIUM 20 MG PO TABS: 20.0000 mg | ORAL_TABLET | Freq: Every day | ORAL | 1 refills | Status: DC

## 2023-05-12 NOTE — Assessment & Plan Note (Signed)
Secondary to radiation due to tonsillar cancer.  Patient levothyroxine 25 mcg daily.  Last TSH at home limits but T4 normal pending labs continue levothyroxine 25 mcg daily currently

## 2023-05-12 NOTE — Assessment & Plan Note (Signed)
Patient currently on atorvastatin refilled today pending lipid panel

## 2023-05-12 NOTE — Assessment & Plan Note (Signed)
History of bypasses.  Patient still has peripheral artery disease is on anticoagulation and antilipid therapy.  Patient does have sensation abnormalities to bilateral lower extremities

## 2023-05-12 NOTE — Assessment & Plan Note (Signed)
History of the same pending lipid panel.  Atorvastatin 20 mg refilled today

## 2023-05-12 NOTE — Assessment & Plan Note (Signed)
Patient has went through treatment inclusive of radiation and chemotherapy this has been completed approximately 4 years ago.  He is now dealing with sequela and is followed by [facility].

## 2023-05-12 NOTE — Assessment & Plan Note (Signed)
Patient currently maintained on metoprolol 25 mg 1 tablet daily XR.  Refill provided today.  Patient tolerates medication well blood pressure within normal limits

## 2023-05-12 NOTE — Assessment & Plan Note (Signed)
Paroxysmal by  report.  Patient is anticoagulated and on metoprolol.  Continue with medication as prescribed he is not followed by cardiology currently

## 2023-05-12 NOTE — Progress Notes (Signed)
New Patient Office Visit  Subjective    Patient ID: [name] [name], male    DOB: 11-21-1960  Age: 62 y.o. MRN: [id number]  CC:  Chief Complaint  Patient presents with   Establish Care   Medication Refill    HPI [name] [name] presents to establish care   HTn: does check bp cuff at home intermittently and tolerates the Met  PAD: Followed by Dr. Juanetta Gosling vascular that is every 6 months.  Patient is on antilipid therapy and anticoagulation.  T AAA: followed by Dr. Lenell Antu patient is status post repair  DVT: History of recurrent DVTs patient is on Xarelto 20 mg and followed by hematology  Afib: Does not follow by cardiology.  Currently on anticoagulation and metoprolol  HLD: atorvastatin  for cholesterol.  Cancer:  radiation and chemo therapy. He has finished that for the past 4 years. He has a lot of sequlaea. He is being followed by [facility]  Flu: Get at local pharmacy Covid: Get at local pharmacy Shingles: At the pharmacy Pna; 2023  Colonoscopy: amb refer gastro [place] psa: Due  Outpatient Encounter Medications as of 05/12/2023  Medication Sig   acetaminophen (TYLENOL) 325 MG tablet Take 2 tablets (650 mg total) by mouth every 6 (six) hours.   aspirin EC 81 MG EC tablet Take 1 tablet (81 mg total) by mouth daily. Swallow whole.   cyclobenzaprine (FLEXERIL) 10 MG tablet Take 1 tablet (10 mg total) by mouth 3 (three) times daily as needed for muscle spasms.   gabapentin (NEURONTIN) 100 MG capsule Take by mouth.   ibuprofen (ADVIL) 200 MG tablet Take 200 mg by mouth every 6 (six) hours as needed.   levothyroxine (SYNTHROID) 25 MCG tablet TAKE 1 TABLET (25 MCG TOTAL) BY MOUTH DAILY BEFORE BREAKFAST. TAKE ON EMPTY STOMACH W/ WATER, 1 HOUR BEFORE OTHER MEDS OR FOOD.   oxyCODONE (OXY IR/ROXICODONE) 5 MG immediate release tablet Take 1 tablet (5 mg total) by mouth every 6 (six) hours as needed for severe pain.   pantoprazole (PROTONIX) 40 MG tablet Take 1 tablet (40 mg total) by  mouth 2 (two) times daily.   polyethylene glycol (MIRALAX / GLYCOLAX) 17 g packet Take 17 g by mouth 2 (two) times daily.   rivaroxaban (XARELTO) 20 MG TABS tablet Take 1 tablet (20 mg total) by mouth daily with supper.   [DISCONTINUED] amoxicillin-clavulanate (AUGMENTIN) 875-125 MG tablet Take 1 tablet by mouth 2 (two) times daily.   [DISCONTINUED] atorvastatin (LIPITOR) 20 MG tablet TAKE 1 TABLET BY MOUTH EVERY DAY   [DISCONTINUED] LORazepam (ATIVAN) 0.5 MG tablet Take 1 tablet (0.5 mg total) by mouth at bedtime.   atorvastatin (LIPITOR) 20 MG tablet Take 1 tablet (20 mg total) by mouth daily.   metoprolol succinate (TOPROL-XL) 25 MG 24 hr tablet Take 1 tablet (25 mg total) by mouth daily.   [DISCONTINUED] metoprolol succinate (TOPROL-XL) 25 MG 24 hr tablet Take 1 tablet (25 mg total) by mouth daily. (Patient not taking: Reported on 05/12/2023)   No facility-administered encounter medications on file as of 05/12/2023.    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   Right SFA 05/07/17 while on warfarin INR 2.9   Atrial fibrillation (HCC)  Benign essential HTN 01/04/2012   Chronic anticoagulation 01/02/2013   Dermatofibroma of forearm 01/02/2013   Left side   DVT, lower extremity, proximal (HCC) 05/24/2013   Right leg femoral & popliteal 05/24/13   Hyperlipidemia, mixed 01/04/2012   Hypothyroidism    Pneumonia    Polycythemia secondary to smoking 01/04/2012   Primary hypercoagulable state (HCC) 10/01/2014   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HCC)    Sinus bradycardia, chronic 01/04/2012   Superficial thrombosis of lower extremity 05/02/2012    Past Surgical History:  Procedure Laterality Date   ABDOMINAL  AORTOGRAM W/LOWER EXTREMITY Right 11/10/2021   Procedure: ABDOMINAL AORTOGRAM W/LOWER EXTREMITY;  Surgeon: Maeola Harman, MD;  Location: [facility] INVASIVE CV LAB;  Service: Cardiovascular;  Laterality: Right;   AORTA - BILATERAL FEMORAL ARTERY BYPASS GRAFT Bilateral 11/26/2021   Procedure: AORTO- BIILIAC ARTERY  BYPASS GRAFT USING HEMASHIELD 20 X , RIGHT ILIO-FEMORAL BYPASS  UMBILICAL HERNIA REPAIR;  Surgeon: Leonie Douglas, MD;  Location: [facility] OR;  Service: Vascular;  Laterality: Bilateral;   APPLICATION OF WOUND VAC Right 11/26/2021   Procedure: APPLICATION OF WOUND VAC;  Surgeon: Leonie Douglas, MD;  Location: [facility] OR;  Service: Vascular;  Laterality: Right;   DIRECT LARYNGOSCOPY Left 10/19/2018   Procedure: DIRECT LARYNGOSCOPY;  Surgeon: Newman Pies, MD;  Location: [facility];  Service: ENT;  Laterality: Left;   FEMORAL-TIBIAL BYPASS GRAFT Right 11/26/2021   Procedure: RIGHT FEMORALTIBIAL  ARTERY BYPASS GRAFT USING PROPATEN GRAFT;  Surgeon: Leonie Douglas, MD;  Location: [facility] OR;  Service: Vascular;  Laterality: Right;   IR IMAGING GUIDED PORT INSERTION  11/04/2018   IR REMOVAL TUN ACCESS W/ PORT W/O FL MOD SED  05/01/2019   TONSILLECTOMY Left 10/19/2018   Procedure: BIOPSY OF LEFT TONSIL;  Surgeon: Newman Pies, MD;  Location: [facility];  Service: ENT;  Laterality: Left;    Family History  Problem Relation Age of Onset   Stroke Father     Social History   Socioeconomic History   Marital status: Divorced    Spouse name: Not on file   Number of children: 2   Years of education: Not on file   Highest education level: Bachelor's degree (e.g., BA, AB, BS)  Occupational History   Not on file  Tobacco Use   Smoking status: Former    Current packs/day: 0.00    Average packs/day: 1 pack/day for 30.0 years (30.0 ttl pk-yrs)    Types: Cigarettes    Start date: 10/18/1988    Quit date: 10/19/2018    Years since quitting: 4.5    Passive exposure: Never (1 a week)    Smokeless tobacco: Never  Vaping Use   Vaping status: Former   Substances: Nicotine, Flavoring   Devices: 1 cartridge q2-3 days, he tells me he is not using.   Substance and Sexual Activity   Alcohol use: Not Currently    Comment: occasionally. once a week   Drug use: Not Currently    Types: Marijuana   Sexual activity: Not on file  Other Topics Concern   Not on file  Social History Narrative   Not on file   Social Determinants of Health   Financial Resource Strain: Low Risk  (05/12/2023)   Overall Financial Resource Strain (CARDIA)    Difficulty of Paying Living Expenses: Not very hard  Food Insecurity: No Food Insecurity (05/12/2023)   Hunger Vital Sign    Worried About Running Out of Food in the Last  Year: Never true    Ran Out of Food in the Last Year: Never true  Transportation Needs: No Transportation Needs (05/12/2023)   PRAPARE - Administrator, Civil Service (Medical): No    Lack of Transportation (Non-Medical): No  Physical Activity: Not on file  Stress: Not on file  Social Connections: Unknown (05/12/2023)   Social Connection and Isolation Panel [NHANES]    Frequency of Communication with Friends and Family: Twice a week    Frequency of Social Gatherings with Friends and Family: Once a week    Attends Religious Services: Patient declined    Database administrator or Organizations: Patient declined    Attends Banker Meetings: Not on file    Marital Status: Living with partner  Intimate Partner Violence: Not At Risk (10/21/2018)   Humiliation, Afraid, Rape, and Kick questionnaire    Fear of Current or Ex-Partner: No    Emotionally Abused: No    Physically Abused: No    Sexually Abused: No    Review of Systems  Constitutional:  Negative for chills and fever.  Respiratory:  Negative for shortness of breath.   Cardiovascular:  Negative for chest pain and leg swelling.  Gastrointestinal:  Negative for abdominal pain, blood in stool,  constipation, diarrhea, nausea and vomiting.       Once or twice a week . States that he is doing liquid diet  Genitourinary:  Negative for dysuria and hematuria.  Neurological:  Positive for tingling. Negative for headaches.  Psychiatric/Behavioral:  Negative for hallucinations and suicidal ideas.         Objective    BP 124/88   Pulse 90   Temp (!) 97.3 F (36.3 C) (Oral)   Ht 6' 3.05" (1.906 m)   Wt 233 lb 3.2 oz (105.8 kg)   SpO2 96%   BMI 29.11 kg/m   Physical Exam Vitals and nursing note reviewed.  Constitutional:      Appearance: Normal appearance.  HENT:     Right Ear: Tympanic membrane, ear canal and external ear normal.     Left Ear: Tympanic membrane, ear canal and external ear normal.     Mouth/Throat:     Mouth: Mucous membranes are moist.     Dentition: Abnormal dentition. Dental caries present.     Pharynx: Oropharynx is clear.   Eyes:     Extraocular Movements: Extraocular movements intact.     Pupils: Pupils are equal, round, and reactive to light.  Cardiovascular:     Rate and Rhythm: Normal rate and regular rhythm.     Pulses:          Radial pulses are 2+ on the right side and 2+ on the left side.       Posterior tibial pulses are 1+ on the right side and 1+ on the left side.     Heart sounds: Normal heart sounds.  Pulmonary:     Effort: Pulmonary effort is normal.     Breath sounds: Normal breath sounds.  Abdominal:     General: Bowel sounds are normal. There is no distension.     Palpations: There is no mass.     Tenderness: There is no abdominal tenderness.     Hernia: A hernia is present.       Comments: Large umbilical hernia  Redline represent scar from aneurysm repair  Musculoskeletal:     Right lower leg: No edema.     Left lower leg: No edema.  Lymphadenopathy:     Cervical: No cervical adenopathy.  Skin:    General: Skin is warm.     Findings: Erythema present.     Comments: Ruddy skin to bilateral lower extremities  R  cooler to touch than L   Neurological:     General: No focal deficit present.     Mental Status: He is alert.     Deep Tendon Reflexes:     Reflex Scores:      Bicep reflexes are 2+ on the right side and 2+ on the left side.      Patellar reflexes are 2+ on the right side and 2+ on the left side.    Comments: Bilateral upper and lower extremity strength 5/5  Psychiatric:        Mood and Affect: Mood normal.        Behavior: Behavior normal.        Thought Content: Thought content normal.        Judgment: Judgment normal.         Assessment & Plan:   Problem List Items Addressed This Visit       Cardiovascular and Mediastinum   Benign essential HTN - Primary (Chronic)    Patient currently maintained on metoprolol 25 mg 1 tablet daily XR.  Refill provided today.  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  He is followed by hematology/oncology.      Relevant Medications   atorvastatin (LIPITOR) 20 MG tablet   metoprolol succinate (TOPROL-XL) 25 MG 24 hr tablet   Thoracic ascending aortic aneurysm (HCC) (Chronic)    Patient is currently followed by Dr. Juanetta Gosling vascular.  He has had aortic repair      Relevant Medications   atorvastatin (LIPITOR) 20 MG tablet   metoprolol succinate (TOPROL-XL) 25 MG 24 hr tablet   PAD (peripheral artery disease) (HCC) (Chronic)    History of bypasses.  Patient still has peripheral artery disease is on anticoagulation and antilipid therapy.  Patient does have sensation abnormalities to bilateral lower extremities      Relevant Medications   atorvastatin (LIPITOR) 20 MG tablet   metoprolol succinate (TOPROL-XL) 25  MG 24 hr tablet   Other Relevant Orders   Lipid panel   Aortic atherosclerosis (HCC)    Patient currently on atorvastatin refilled today pending lipid panel      Relevant Medications   atorvastatin (LIPITOR) 20 MG tablet   metoprolol succinate (TOPROL-XL) 25 MG 24 hr tablet   Atrial fibrillation (HCC)    Paroxysmal by  report.  Patient is anticoagulated and on metoprolol.  Continue with medication as prescribed he is not followed by cardiology currently      Relevant Medications   atorvastatin (LIPITOR) 20 MG tablet   metoprolol succinate (TOPROL-XL) 25 MG 24 hr tablet     Respiratory   Malignant neoplasm of tonsillar fossa (HCC)    Patient has went through treatment inclusive of radiation and chemotherapy this has been completed approximately 4 years ago.  He is now dealing with sequela and is followed by [facility].        Endocrine  Hypothyroidism (acquired) (Chronic)    Secondary to radiation due to tonsillar cancer.  Patient levothyroxine 25 mcg daily.  Last TSH at home limits but T4 normal pending labs continue levothyroxine 25 mcg daily currently      Relevant Medications   metoprolol succinate (TOPROL-XL) 25 MG 24 hr tablet   Other Relevant Orders   TSH   T4, free     Other   Hyperlipidemia    History of the same pending lipid panel.  Atorvastatin 20 mg refilled today      Relevant Medications   atorvastatin (LIPITOR) 20 MG tablet   metoprolol succinate (TOPROL-XL) 25 MG 24 hr tablet   Former tobacco use    Urine microscopy to rule out microscopic hematuria.      Relevant Orders   Urine Microscopic   Other Visit Diagnoses     Screening for prostate cancer       Relevant Orders   PSA   Screening for colon cancer       Relevant Orders   Ambulatory referral to Gastroenterology   Encounter for cardiac risk counseling       Relevant Medications   metoprolol succinate (TOPROL-XL) 25 MG 24 hr tablet       Return in about 6 months (around  11/10/2023) for CPE and Labs.   Audria Nine, NP

## 2023-05-12 NOTE — Patient Instructions (Signed)
Nice to see you today Make a fasting lab appointment over then ext 2 weeks Make a 6 month follow up with me for your physical  Get your flu and shingles vaccine at the pharmacy

## 2023-05-12 NOTE — Assessment & Plan Note (Signed)
Patient is currently maintained on rivaroxaban 20 mg daily.  He is followed by hematology/oncology.

## 2023-05-12 NOTE — Assessment & Plan Note (Signed)
 Urine microscopy to rule out microscopic hematuria

## 2023-05-12 NOTE — Assessment & Plan Note (Addendum)
Patient is currently followed by Dr. Juanetta Gosling vascular.  He has had aortic repair

## 2023-05-17 NOTE — Progress Notes (Deleted)
Palliative Medicine [facility]  [phone] [phone]   Name: [name] [name] Date: 05/17/2023 MRN: [id number]  DOB: 06-02-1961  Patient Care Team: [name] Emms, NP as PCP - General (Nurse Practitioner) [name] Feinstein, MD as Consulting Physician (Oncology) [name] Pies, MD as Consulting Physician (Otolaryngology) [name] Peak, MD as Attending Physician (Radiation Oncology) [name] Holms, MD (Inactive) as Consulting Physician (Hematology) [name] Folk, RN (Inactive) as Oncology Nurse Navigator [name] [name], [name] Baumgartner, NP as Nurse Practitioner (Nurse Practitioner)   I connected with [name] [name] on 05/17/23 at 11:00 AM EDT by phone and verified that I am speaking with the correct person using two identifiers.   I discussed the limitations, risks, security and privacy concerns of performing an evaluation and management service by telemedicine and the availability of in-person appointments. I also discussed with the patient that there may be a patient responsible charge related to this service. The patient expressed understanding and agreed to proceed.   Other persons participating in the visit and their role in the encounter:n /a   Patient's location: home  Provider's location: [facility]   Chief Complaint: f/u of symptom management    INTERVAL HISTORY: [name] [name] is a 62 y.o. 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 Palliative ask to see for symptom management.    SOCIAL HISTORY:     reports that he quit smoking about 4 years ago. His smoking use included cigarettes. He started smoking about 34 years ago. He has a 30 pack-year smoking history. He has never been exposed to tobacco smoke. He has never used smokeless tobacco. He reports that he does not currently use alcohol. He reports that he does not currently use drugs after having used the following  drugs: Marijuana.  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   Right SFA 05/07/17 while on warfarin INR 2.9   Atrial fibrillation (HCC)    Benign essential HTN 01/04/2012   Chronic anticoagulation 01/02/2013   Dermatofibroma of forearm 01/02/2013   Left side   DVT, lower extremity, proximal (HCC) 05/24/2013   Right leg femoral & popliteal 05/24/13   Hyperlipidemia, mixed 01/04/2012   Hypothyroidism    Pneumonia    Polycythemia secondary to smoking 01/04/2012   Primary hypercoagulable state (HCC) 10/01/2014   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HCC)    Sinus bradycardia, chronic 01/04/2012   Superficial thrombosis of lower extremity 05/02/2012    ALLERGIES:  has No Known Allergies.  MEDICATIONS:  Current Outpatient Medications  Medication Sig Dispense Refill   acetaminophen (TYLENOL) 325 MG tablet Take 2 tablets (650 mg total) by mouth every 6 (six) hours.     aspirin EC 81 MG EC tablet Take 1 tablet (81 mg total) by mouth daily. Swallow whole. 30 tablet 11   atorvastatin (LIPITOR) 20 MG tablet Take 1 tablet (20 mg total) by mouth daily. 90 tablet 1   cyclobenzaprine (FLEXERIL) 10 MG tablet Take 1 tablet (10 mg total) by mouth 3 (three) times daily as needed for muscle spasms. 90 tablet 0   gabapentin (NEURONTIN) 100 MG  capsule Take by mouth.     ibuprofen (ADVIL) 200 MG tablet Take 200 mg by mouth every 6 (six) hours as needed.     levothyroxine (SYNTHROID) 25 MCG tablet TAKE 1 TABLET (25 MCG TOTAL) BY MOUTH DAILY BEFORE BREAKFAST. TAKE ON EMPTY STOMACH W/ WATER, 1 HOUR BEFORE OTHER MEDS OR FOOD. 90 tablet 0   metoprolol succinate  (TOPROL-XL) 25 MG 24 hr tablet Take 1 tablet (25 mg total) by mouth daily. 90 tablet 1   oxyCODONE (OXY IR/ROXICODONE) 5 MG immediate release tablet Take 1 tablet (5 mg total) by mouth every 6 (six) hours as needed for severe pain. 90 tablet 0   pantoprazole (PROTONIX) 40 MG tablet Take 1 tablet (40 mg total) by mouth 2 (two) times daily. 60 tablet 1   polyethylene glycol (MIRALAX / GLYCOLAX) 17 g packet Take 17 g by mouth 2 (two) times daily. 14 each 0   rivaroxaban (XARELTO) 20 MG TABS tablet Take 1 tablet (20 mg total) by mouth daily with supper. 90 tablet 2   No current facility-administered medications for this visit.    VITAL SIGNS: There were no vitals taken for this visit. There were no vitals filed for this visit.  Estimated body mass index is 29.11 kg/m as calculated from the following:   Height as of 05/12/23: 6' 3.05" (1.906 m).   Weight as of 05/12/23: 233 lb 3.2 oz (105.8 kg).   PERFORMANCE STATUS (ECOG) : 1 - Symptomatic but completely ambulatory   IMPRESSION:  I connected with [name] [name] by phone. No acute distress identified. Denies nausea, vomiting, constipation, or diarrhea. Appetite is fair. Some days better than others considering his overall condition. Reports plans to follow-up next week at [facility] for tooth extraction and other procedures that will require multiple visits.   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 He is currently taking oxycodone 5 mg every 6 hours as needed for pain, Advil in between, and flexeril as needed for spasms. Does not require oxycodone daily. We will continue current regimen with close follow-up.  Taking as directed. Refills appropriate past due indicating patient is taking responsibly.   We will continue to closely monitor and support as needed.   PLAN: Oxycodone as needed for pain. Not requiring daily Flexeril as needed for muscle spasms We will continue to closely monitor and assist with  symptom management. I will plan to see patient back in 6-8 weeks.  Patient expressed understanding and was in agreement with this plan. He also understands that He can call the clinic at any time with any questions, concerns, or complaints.      Any controlled substances utilized were prescribed in the context of palliative care. PDMP has been reviewed.    Visit consisted of counseling and education dealing with the complex and emotionally intense issues of symptom management and palliative care in the setting of serious and potentially life-threatening illness.Greater than 50%  of this time was spent counseling and coordinating care related to the above assessment and plan.  Willette Alma, AGPCNP-BC  Palliative Medicine Team/[facility]  *Please note that this is a verbal dictation therefore any spelling or grammatical errors are due to the "[employer]agon [employer] One" system interpretation.

## 2023-05-20 ENCOUNTER — Inpatient Hospital Stay: Payer: Medicaid Other | Admitting: Nurse Practitioner

## 2023-05-24 ENCOUNTER — Inpatient Hospital Stay: Payer: Medicaid Other | Attending: Hematology and Oncology | Admitting: Nurse Practitioner

## 2023-05-24 DIAGNOSIS — C090 Malignant neoplasm of tonsillar fossa: Secondary | ICD-10-CM | POA: Diagnosis not present

## 2023-05-24 DIAGNOSIS — Z515 Encounter for palliative care: Secondary | ICD-10-CM

## 2023-05-24 DIAGNOSIS — G893 Neoplasm related pain (acute) (chronic): Secondary | ICD-10-CM | POA: Diagnosis not present

## 2023-05-24 DIAGNOSIS — R530 Neoplastic (malignant) related fatigue: Secondary | ICD-10-CM

## 2023-05-24 NOTE — Progress Notes (Addendum)
Palliative Medicine [facility]  Telephone:([phone] Fax:(336) [phone]   Name: [name] [name] Date: 05/24/2023 MRN: [id number]  DOB: 10-16-1960  Patient Care Team: Eden Emms, NP as PCP - General (Nurse Practitioner) Levert Feinstein, MD as Consulting Physician (Oncology) Newman Pies, MD as Consulting Physician (Otolaryngology) Lonie Peak, MD as Attending Physician (Radiation Oncology) Arthur Holms, MD (Inactive) as Consulting Physician (Hematology) Barrie Folk, RN (Inactive) as Oncology Nurse Navigator Pickenpack-Cousar, Arty Baumgartner, NP as Nurse Practitioner (Nurse Practitioner)   I connected with [name] [name] on 05/24/23 at  3:00 PM EST by phone and verified that I am speaking with the correct person using two identifiers.   I discussed the limitations, risks, security and privacy concerns of performing an evaluation and management service by telemedicine and the availability of in-person appointments. I also discussed with the patient that there may be a patient responsible charge related to this service. The patient expressed understanding and agreed to proceed.   Other persons participating in the visit and their role in the encounter:n /a   Patient's location: home  Provider's location: [facility]   Chief Complaint: f/u of symptom management    INTERVAL HISTORY: [name] [name] is a 62 y.o. 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 Palliative ask to see for symptom management.    SOCIAL HISTORY:     reports that he quit smoking about 4 years ago. His smoking use included cigarettes. He started smoking about 34 years ago. He has a 30 pack-year smoking history. He has never been exposed to tobacco smoke. He has never used smokeless tobacco. He reports that he does not currently use alcohol. He reports that he does not currently use drugs after having used the following  drugs: Marijuana.  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   Right SFA 05/07/17 while on warfarin INR 2.9   Atrial fibrillation (HCC)    Benign essential HTN 01/04/2012   Chronic anticoagulation 01/02/2013   Dermatofibroma of forearm 01/02/2013   Left side   DVT, lower extremity, proximal (HCC) 05/24/2013   Right leg femoral & popliteal 05/24/13   Hyperlipidemia, mixed 01/04/2012   Hypothyroidism    Pneumonia    Polycythemia secondary to smoking 01/04/2012   Primary hypercoagulable state (HCC) 10/01/2014   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HCC)    Sinus bradycardia, chronic 01/04/2012   Superficial thrombosis of lower extremity 05/02/2012    ALLERGIES:  has No Known Allergies.  MEDICATIONS:  Current Outpatient Medications  Medication Sig Dispense Refill   acetaminophen (TYLENOL) 325 MG tablet Take 2 tablets (650 mg total) by mouth every 6 (six) hours.     aspirin EC 81 MG EC tablet Take 1 tablet (81 mg total) by mouth daily. Swallow whole. 30 tablet 11   atorvastatin (LIPITOR) 20 MG tablet Take 1 tablet (20 mg total) by mouth daily. 90 tablet 1   cyclobenzaprine (FLEXERIL) 10 MG tablet Take 1 tablet (10 mg total) by mouth 3 (three) times daily as needed for muscle spasms. 90 tablet 0   gabapentin (NEURONTIN) 100  MG capsule Take by mouth.     ibuprofen (ADVIL) 200 MG tablet Take 200 mg by mouth every 6 (six) hours as needed.     levothyroxine (SYNTHROID) 25 MCG tablet TAKE 1 TABLET (25 MCG TOTAL) BY MOUTH DAILY BEFORE BREAKFAST. TAKE ON EMPTY STOMACH W/ WATER, 1 HOUR BEFORE OTHER MEDS OR FOOD. 90 tablet 0   metoprolol succinate  (TOPROL-XL) 25 MG 24 hr tablet Take 1 tablet (25 mg total) by mouth daily. 90 tablet 1   oxyCODONE (OXY IR/ROXICODONE) 5 MG immediate release tablet Take 1 tablet (5 mg total) by mouth every 6 (six) hours as needed for severe pain. 90 tablet 0   pantoprazole (PROTONIX) 40 MG tablet Take 1 tablet (40 mg total) by mouth 2 (two) times daily. 60 tablet 1   polyethylene glycol (MIRALAX / GLYCOLAX) 17 g packet Take 17 g by mouth 2 (two) times daily. 14 each 0   rivaroxaban (XARELTO) 20 MG TABS tablet Take 1 tablet (20 mg total) by mouth daily with supper. 90 tablet 2   No current facility-administered medications for this visit.    VITAL SIGNS: There were no vitals taken for this visit. There were no vitals filed for this visit.  Estimated body mass index is 29.11 kg/m as calculated from the following:   Height as of 05/12/23: 6' 3.05" (1.906 m).   Weight as of 05/12/23: 233 lb 3.2 oz (105.8 kg).   PERFORMANCE STATUS (ECOG) : 1 - Symptomatic but completely ambulatory   IMPRESSION:  I connected with [name] [name] by phone. No acute distress identified. Denies nausea, vomiting, constipation, or diarrhea. Appetite is fair. Some days better than others considering his overall condition. Reports he was scheduled to have some dental/oral work however appointment was cancelled. He is waiting for this to be rescheduled. [name] [name] he is now only consuming mainly liquid diet with a small amount of soft foods.   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 He is currently taking oxycodone 5 mg every 6 hours as needed for pain, Advil in between, and flexeril as needed for spasms. Does not require oxycodone daily. We will continue current regimen with close follow-up.  Taking as directed. Refills appropriate past due indicating patient is taking responsibly.   We will continue to closely monitor and support as needed.   PLAN: Oxycodone as needed for pain. Not  requiring daily Flexeril as needed for muscle spasms We will continue to closely monitor and assist with symptom management. I will plan to see patient back in 6-8 weeks.  Patient expressed understanding and was in agreement with this plan. He also understands that He can call the clinic at any time with any questions, concerns, or complaints.      Any controlled substances utilized were prescribed in the context of palliative care. PDMP has been reviewed.    I provided 35 minutes of face-to-face video visit time during this encounter.   Visit consisted of counseling and education dealing with the complex and emotionally intense issues of symptom management and palliative care in the setting of serious and potentially life-threatening illness.  Willette Alma, AGPCNP-BC  Palliative Medicine Team/ [facility]  *Please note that this is a verbal dictation therefore any spelling or grammatical errors are due to the "Dragon Medical One" system interpretation.

## 2023-05-25 ENCOUNTER — Ambulatory Visit: Payer: Medicaid Other | Admitting: Physician Assistant

## 2023-05-25 ENCOUNTER — Encounter: Payer: Self-pay | Admitting: Physician Assistant

## 2023-05-25 ENCOUNTER — Ambulatory Visit (HOSPITAL_COMMUNITY)
Admission: RE | Admit: 2023-05-25 | Discharge: 2023-05-25 | Disposition: A | Discharge status: Home / Self Care | Payer: Medicaid Other | Source: Ambulatory Visit | Attending: Vascular Surgery | Admitting: Vascular Surgery

## 2023-05-25 VITALS — BP 131/91 | HR 87 | Temp 97.6°F | Ht 75.0 in | Wt 234.8 lb

## 2023-05-25 DIAGNOSIS — I739 Peripheral vascular disease, unspecified: Secondary | ICD-10-CM

## 2023-05-25 LAB — VAS US ABI WITH/WO TBI
Left ABI: 1.28
Right ABI: 0.71

## 2023-05-25 NOTE — Progress Notes (Signed)
[facility] [place] HISTORY AND PHYSICAL   History of Present Illness:  Patient is a 62 y.o. year old male who presents for evaluation of PAD.  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   On 11/26/21 he under went  PROCEDURE:     1) aorto-bi-iliac bypass (20x57mm Dacron) 2) right ilio-femoral bypass (8mm Dacron) 3) right femoral - posterior tibial artery bypass (6mm) 4) umbilical hernia repair  He states he has no claudication symptoms with ambulation on the right LE, no new non healing wounds. And no rest pain that wakes him from his sleep.   He is here for a yearly f/u.  He is managed on Xarelto and is followed by a Hematologist for history of recurrent DVT and history of Afib.  He is medically managed on ASA and Statin daily as well.     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   Right SFA 05/07/17 while on warfarin INR 2.9   Atrial fibrillation (HCC)    Benign essential HTN 01/04/2012   Chronic anticoagulation 01/02/2013   Dermatofibroma of forearm 01/02/2013   Left side   DVT, lower extremity, proximal (HCC) 05/24/2013   Right leg femoral & popliteal 05/24/13   Hyperlipidemia, mixed 01/04/2012   Hypothyroidism    Pneumonia    Polycythemia secondary to smoking 01/04/2012   Primary hypercoagulable state (HCC) 10/01/2014   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HCC)    Sinus bradycardia, chronic 01/04/2012   Superficial thrombosis of lower extremity 05/02/2012    Past Surgical History:  Procedure Laterality Date   ABDOMINAL AORTOGRAM W/LOWER EXTREMITY Right 11/10/2021   Procedure:  ABDOMINAL AORTOGRAM W/LOWER EXTREMITY;  Surgeon: Maeola Harman, MD;  Location: [facility] INVASIVE CV LAB;  Service: Cardiovascular;  Laterality: Right;   AORTA - BILATERAL FEMORAL ARTERY BYPASS GRAFT Bilateral 11/26/2021   Procedure: AORTO- BIILIAC ARTERY  BYPASS GRAFT USING HEMASHIELD 20 X , RIGHT ILIO-FEMORAL BYPASS  UMBILICAL HERNIA REPAIR;  Surgeon: Leonie Douglas, MD;  Location: [facility] OR;  Service: Vascular;  Laterality: Bilateral;   APPLICATION OF WOUND VAC Right 11/26/2021   Procedure: APPLICATION OF WOUND VAC;  Surgeon: Leonie Douglas, MD;  Location: [facility] OR;  Service: Vascular;  Laterality: Right;   DIRECT LARYNGOSCOPY Left 10/19/2018   Procedure: DIRECT LARYNGOSCOPY;  Surgeon: Newman Pies, MD;  Location: [facility];  Service: ENT;  Laterality: Left;   FEMORAL-TIBIAL BYPASS GRAFT Right 11/26/2021   Procedure: RIGHT FEMORALTIBIAL  ARTERY BYPASS GRAFT USING PROPATEN GRAFT;  Surgeon: Leonie Douglas, MD;  Location: [facility] OR;  Service: Vascular;  Laterality: Right;   IR IMAGING GUIDED PORT INSERTION  11/04/2018   IR REMOVAL TUN ACCESS W/ PORT W/O FL MOD SED  05/01/2019   TONSILLECTOMY Left 10/19/2018   Procedure: BIOPSY OF LEFT TONSIL;  Surgeon: Newman Pies, MD;  Location: [facility];  Service: ENT;  Laterality: Left;    ROS:   General:  No weight loss, Fever, chills  HEENT: No recent headaches, no nasal bleeding, no visual changes, no sore throat  Neurologic: No dizziness, blackouts, seizures. No recent symptoms of stroke or mini- stroke. No recent  episodes of slurred speech, or temporary blindness.  Cardiac: No recent episodes of chest pain/pressure, no shortness of breath at rest.  No shortness of breath with exertion.  Denies history of atrial fibrillation or irregular heartbeat  Vascular: No history of rest pain in feet.  No history of claudication.  No history of non-healing ulcer, No history of DVT   Pulmonary: No home oxygen, no productive cough, no  hemoptysis,  No asthma or wheezing  Musculoskeletal:  [ ]  Arthritis, [ ]  Low back pain,  [ ]  Joint pain  Hematologic:No history of hypercoagulable state.  No history of easy bleeding.  No history of anemia  Gastrointestinal: No hematochezia or melena,  No gastroesophageal reflux, no trouble swallowing  Urinary: [ ]  chronic Kidney disease, [ ]  on HD - [ ]  MWF or [ ]  TTHS, [ ]  Burning with urination, [ ]  Frequent urination, [ ]  Difficulty urinating;   Skin: No rashes  Psychological: No history of anxiety,  No history of depression  Social History Social History   Tobacco Use   Smoking status: Former    Current packs/day: 0.00    Average packs/day: 1 pack/day for 30.0 years (30.0 ttl pk-yrs)    Types: Cigarettes    Start date: 10/18/1988    Quit date: 10/19/2018    Years since quitting: 4.6    Passive exposure: Never (1 a week)   Smokeless tobacco: Never  Vaping Use   Vaping status: Former   Substances: Nicotine, Flavoring   Devices: 1 cartridge q2-3 days, he tells me he is not using.   Substance Use Topics   Alcohol use: Not Currently    Comment: occasionally. once a week   Drug use: Not Currently    Types: Marijuana    Family History Family History  Problem Relation Age of Onset   Stroke Father     Allergies  No Known Allergies   Current Outpatient Medications  Medication Sig Dispense Refill   acetaminophen (TYLENOL) 325 MG tablet Take 2 tablets (650 mg total) by mouth every 6 (six) hours.     aspirin EC 81 MG EC tablet Take 1 tablet (81 mg total) by mouth daily. Swallow whole. 30 tablet 11   atorvastatin (LIPITOR) 20 MG tablet Take 1 tablet (20 mg total) by mouth daily. 90 tablet 1   cyclobenzaprine (FLEXERIL) 10 MG tablet Take 1 tablet (10 mg total) by mouth 3 (three) times daily as needed for muscle spasms. 90 tablet 0   gabapentin (NEURONTIN) 100 MG capsule Take by mouth.     ibuprofen (ADVIL) 200 MG tablet Take 200 mg by mouth every 6 (six) hours as needed.      levothyroxine (SYNTHROID) 25 MCG tablet TAKE 1 TABLET (25 MCG TOTAL) BY MOUTH DAILY BEFORE BREAKFAST. TAKE ON EMPTY STOMACH W/ WATER, 1 HOUR BEFORE OTHER MEDS OR FOOD. 90 tablet 0   metoprolol succinate (TOPROL-XL) 25 MG 24 hr tablet Take 1 tablet (25 mg total) by mouth daily. 90 tablet 1   oxyCODONE (OXY IR/ROXICODONE) 5 MG immediate release tablet Take 1 tablet (5 mg total) by mouth every 6 (six) hours as needed for severe pain. 90 tablet 0   pantoprazole (PROTONIX) 40 MG tablet Take 1 tablet (40 mg total) by mouth 2 (two) times daily. 60 tablet 1   polyethylene glycol (MIRALAX / GLYCOLAX) 17 g packet Take 17 g by mouth 2 (two) times daily. 14 each 0   rivaroxaban (XARELTO) 20 MG TABS tablet Take 1 tablet (20  mg total) by mouth daily with supper. 90 tablet 2   No current facility-administered medications for this visit.    Physical Examination  Vitals:   05/25/23 1509  BP: (!) 131/91  Pulse: 87  Temp: 97.6 F (36.4 C)  SpO2: 96%  Weight: 234 lb 12.8 oz (106.5 kg)  Height: 6\' 3"  (1.905 m)    Body mass index is 29.35 kg/m.  General:  Alert and oriented, no acute distress HEENT: Normal Neck: No bruit or JVD Pulmonary: Clear to auscultation bilaterally Cardiac: Regular Rate and Rhythm without murmur Abdomen: Soft, non-tender, non-distended, no mass, no scars Skin: No rash Extremity Pulses: radial, femoral, left dorsalis pedis,  pulse Musculoskeletal: No deformity or edema  Neurologic: Upper and lower extremity motor 5/5 and symmetric  DATA:  ABI Findings:  +---------+------------------+-----+----------+--------+  Right   Rt Pressure (mmHg)IndexWaveform  Comment   +---------+------------------+-----+----------+--------+  Brachial 129                                        +---------+------------------+-----+----------+--------+  PTA     94                0.71 monophasic          +---------+------------------+-----+----------+--------+  DP                              absent              +---------+------------------+-----+----------+--------+  Great Toe                       Absent              +---------+------------------+-----+----------+--------+   +---------+------------------+-----+---------+-------+  Left    Lt Pressure (mmHg)IndexWaveform Comment  +---------+------------------+-----+---------+-------+  Brachial 132                                      +---------+------------------+-----+---------+-------+  PTA     169               1.28 triphasic         +---------+------------------+-----+---------+-------+  DP      165               1.25 triphasic         +---------+------------------+-----+---------+-------+  Great Toe161               1.22                   +---------+------------------+-----+---------+-------+   +-------+-----------+-----------+------------+------------+  ABI/TBIToday's ABIToday's TBIPrevious ABIPrevious TBI  +-------+-----------+-----------+------------+------------+  Right 0.71       0.0        0.59        0.0           +-------+-----------+-----------+------------+------------+  Left  1.28       1.22       1.19        0.85          +-------+-----------+-----------+------------+------------+     Arterial wall calcification precludes accurate ankle pressures and ABIs.    Summary:  Right: Resting right ankle-brachial index indicates moderate right lower  extremity arterial disease. The right toe-brachial index is abnormal.   Left: Resting left ankle-brachial index is  within normal range. The left  toe-brachial index is normal.    ASSESSMENT/PLAN:  PAD mixed with aneurysmal disease Now s/p    PROCEDURE:   1) aorto-bi-iliac bypass (20x50mm Dacron) 2) right ilio-femoral bypass (8mm Dacron) 3) right femoral - posterior tibial artery bypass (6mm) 4) umbilical hernia repair  ABI's are stable without change.  No new wounds.  He denies  claudication symptoms with ambulation , no new non healing wounds. And no rest pain that wakes him from his sleep.  Continue activity as tolerates and increase walking to assist inflow to the feet.  If he develops symptoms of ischemia he will call our office otherwise he will return in 1 year for repeat studies.      Mosetta Pigeon PA-C [facility] [place] Office: [phone]  MD in clinic [place]

## 2023-05-27 ENCOUNTER — Other Ambulatory Visit (INDEPENDENT_AMBULATORY_CARE_PROVIDER_SITE_OTHER): Payer: Medicaid Other

## 2023-05-27 DIAGNOSIS — Z87891 Personal history of nicotine dependence: Secondary | ICD-10-CM | POA: Diagnosis not present

## 2023-05-27 DIAGNOSIS — E039 Hypothyroidism, unspecified: Secondary | ICD-10-CM

## 2023-05-27 DIAGNOSIS — Z125 Encounter for screening for malignant neoplasm of prostate: Secondary | ICD-10-CM | POA: Diagnosis not present

## 2023-05-27 DIAGNOSIS — I10 Essential (primary) hypertension: Secondary | ICD-10-CM | POA: Diagnosis not present

## 2023-05-27 DIAGNOSIS — I739 Peripheral vascular disease, unspecified: Secondary | ICD-10-CM | POA: Diagnosis not present

## 2023-05-27 DIAGNOSIS — E782 Mixed hyperlipidemia: Secondary | ICD-10-CM

## 2023-05-28 ENCOUNTER — Encounter: Payer: Self-pay | Admitting: Hematology

## 2023-05-28 LAB — COMPREHENSIVE METABOLIC PANEL
ALT: 16 U/L (ref 0–53)
AST: 20 U/L (ref 0–37)
Albumin: 4.2 g/dL (ref 3.5–5.2)
Alkaline Phosphatase: 100 U/L (ref 39–117)
BUN: 10 mg/dL (ref 6–23)
CO2: 29 meq/L (ref 19–32)
Calcium: 9.7 mg/dL (ref 8.4–10.5)
Chloride: 101 meq/L (ref 96–112)
Creatinine, Ser: 1.04 mg/dL (ref 0.40–1.50)
GFR: 76.49 mL/min (ref >60.00)
Glucose, Bld: 98 mg/dL (ref 70–99)
Potassium: 4 meq/L (ref 3.5–5.1)
Sodium: 141 meq/L (ref 135–145)
Total Bilirubin: 0.9 mg/dL (ref 0.2–1.2)
Total Protein: 6.9 g/dL (ref 6.0–8.3)

## 2023-05-28 LAB — LIPID PANEL
Cholesterol: 143 mg/dL (ref 0–200)
HDL: 43.2 mg/dL (ref >39.00)
LDL Cholesterol: 40 mg/dL (ref 0–99)
NonHDL: 99.35
Total CHOL/HDL Ratio: 3
Triglycerides: 295 mg/dL — ABNORMAL HIGH (ref 0.0–149.0)
VLDL: 59 mg/dL — ABNORMAL HIGH (ref 0.0–40.0)

## 2023-05-28 LAB — CBC
HCT: 50.5 % (ref 39.0–52.0)
Hemoglobin: 17 g/dL (ref 13.0–17.0)
MCHC: 33.7 g/dL (ref 30.0–36.0)
MCV: 96.8 fL (ref 78.0–100.0)
Platelets: 236 10*3/uL (ref 150.0–400.0)
RBC: 5.22 Mil/uL (ref 4.22–5.81)
RDW: 15.5 % (ref 11.5–15.5)
WBC: 7.5 10*3/uL (ref 4.0–10.5)

## 2023-05-28 LAB — URINALYSIS, MICROSCOPIC ONLY: RBC / HPF: NONE SEEN (ref 0–?)

## 2023-05-28 LAB — T4, FREE: Free T4: 0.86 ng/dL (ref 0.60–1.60)

## 2023-05-28 LAB — TSH: TSH: 7.26 u[IU]/mL — ABNORMAL HIGH (ref 0.35–5.50)

## 2023-05-28 LAB — PSA: PSA: 1.89 ng/mL (ref 0.10–4.00)

## 2023-05-31 ENCOUNTER — Other Ambulatory Visit: Payer: Self-pay

## 2023-05-31 DIAGNOSIS — Y842 Radiological procedure and radiotherapy as the cause of abnormal reaction of the patient, or of later complication, without mention of misadventure at the time of the procedure: Secondary | ICD-10-CM

## 2023-05-31 DIAGNOSIS — Z515 Encounter for palliative care: Secondary | ICD-10-CM

## 2023-05-31 DIAGNOSIS — G893 Neoplasm related pain (acute) (chronic): Secondary | ICD-10-CM

## 2023-05-31 MED ORDER — OXYCODONE HCL 5 MG PO TABS: 5.0000 mg | ORAL_TABLET | Freq: Four times a day (QID) | ORAL | 0 refills | Status: DC | PRN

## 2023-05-31 NOTE — Telephone Encounter (Signed)
Pt called for medication refill, see associated orders

## 2023-06-10 ENCOUNTER — Other Ambulatory Visit: Payer: Self-pay

## 2023-06-10 DIAGNOSIS — I739 Peripheral vascular disease, unspecified: Secondary | ICD-10-CM

## 2023-06-15 ENCOUNTER — Encounter: Payer: Self-pay | Admitting: Physician Assistant

## 2023-07-01 ENCOUNTER — Other Ambulatory Visit: Payer: Self-pay

## 2023-07-01 DIAGNOSIS — Z515 Encounter for palliative care: Secondary | ICD-10-CM

## 2023-07-01 DIAGNOSIS — Y842 Radiological procedure and radiotherapy as the cause of abnormal reaction of the patient, or of later complication, without mention of misadventure at the time of the procedure: Secondary | ICD-10-CM

## 2023-07-01 DIAGNOSIS — G893 Neoplasm related pain (acute) (chronic): Secondary | ICD-10-CM

## 2023-07-01 MED ORDER — OXYCODONE HCL 5 MG PO TABS: 5.0000 mg | ORAL_TABLET | Freq: Four times a day (QID) | ORAL | 0 refills | Status: DC | PRN

## 2023-07-01 NOTE — Telephone Encounter (Signed)
 Pt SO called for medication refill, see associated orders.

## 2023-07-02 NOTE — Progress Notes (Unsigned)
Palliative Medicine [facility]  Telephone:(336) [phone] Fax:(336) [phone]   Name: [name] [name] Date: 07/02/2023 MRN: [id number]  DOB: 1960-07-26  Patient Care Team: Eden Emms, NP as PCP - General (Nurse Practitioner) Levert Feinstein, MD as Consulting Physician (Oncology) Newman Pies, MD as Consulting Physician (Otolaryngology) Lonie Peak, MD as Attending Physician (Radiation Oncology) Arthur Holms, MD (Inactive) as Consulting Physician (Hematology) Barrie Folk, RN (Inactive) as Oncology Nurse Navigator Pickenpack-Cousar, Arty Baumgartner, NP as Nurse Practitioner (Nurse Practitioner)   I connected with [name] [name] on 07/02/23 at 11:30 AM EST by phone and verified that I am speaking with the correct person using two identifiers.   I discussed the limitations, risks, security and privacy concerns of performing an evaluation and management service by telemedicine and the availability of in-person appointments. I also discussed with the patient that there may be a patient responsible charge related to this service. The patient expressed understanding and agreed to proceed.   Other persons participating in the visit and their role in the encounter:n /a   Patient's location: home  Provider's location: [facility]   Chief Complaint: f/u of symptom management    INTERVAL HISTORY: [name] [name] is a 62 y.o. 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 Palliative ask to see for symptom management.    SOCIAL HISTORY:     reports that he quit smoking about 4 years ago. His smoking use included cigarettes. He started smoking about 34 years ago. He has a 30 pack-year smoking history. He has never been exposed to tobacco smoke. He has never used smokeless tobacco. He reports that he does not currently use alcohol. He reports that he does not currently use drugs after having used the following  drugs: Marijuana.  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   Right SFA 05/07/17 while on warfarin INR 2.9   Atrial fibrillation (HCC)    Benign essential HTN 01/04/2012   Chronic anticoagulation 01/02/2013   Dermatofibroma of forearm 01/02/2013   Left side   DVT, lower extremity, proximal (HCC) 05/24/2013   Right leg femoral & popliteal 05/24/13   Hyperlipidemia, mixed 01/04/2012   Hypothyroidism    Pneumonia    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  MEDICATIONS:  Current Outpatient Medications  Medication Sig Dispense Refill   acetaminophen (TYLENOL) 325 MG tablet Take 2 tablets (650 mg total) by mouth every 6 (six) hours.     aspirin EC 81 MG EC tablet Take 1 tablet (81 mg total) by mouth daily. Swallow whole. 30 tablet 11   atorvastatin (LIPITOR) 20 MG tablet Take 1 tablet (20 mg total) by mouth daily. 90 tablet 1   cyclobenzaprine (FLEXERIL) 10 MG tablet Take 1 tablet (10 mg total) by mouth 3 (three) times daily as needed for muscle spasms. 90 tablet 0   gabapentin (NEURONTIN) 100 MG  capsule Take by mouth.     ibuprofen (ADVIL) 200 MG tablet Take 200 mg by mouth every 6 (six) hours as needed.     levothyroxine (SYNTHROID) 25 MCG tablet TAKE 1 TABLET (25 MCG TOTAL) BY MOUTH DAILY BEFORE BREAKFAST. TAKE ON EMPTY STOMACH W/ WATER, 1 HOUR BEFORE OTHER MEDS OR FOOD. 90 tablet 0   metoprolol succinate  (TOPROL-XL) 25 MG 24 hr tablet Take 1 tablet (25 mg total) by mouth daily. 90 tablet 1   oxyCODONE (OXY IR/ROXICODONE) 5 MG immediate release tablet Take 1 tablet (5 mg total) by mouth every 6 (six) hours as needed for severe pain (pain score 7-10). 90 tablet 0   pantoprazole (PROTONIX) 40 MG tablet Take 1 tablet (40 mg total) by mouth 2 (two) times daily. 60 tablet 1   polyethylene glycol (MIRALAX / GLYCOLAX) 17 g packet Take 17 g by mouth 2 (two) times daily. 14 each 0   rivaroxaban (XARELTO) 20 MG TABS tablet Take 1 tablet (20 mg total) by mouth daily with supper. 90 tablet 2   No current facility-administered medications for this visit.    VITAL SIGNS: There were no vitals taken for this visit. There were no vitals filed for this visit.  Estimated body mass index is 29.35 kg/m as calculated from the following:   Height as of 05/25/23: 6\' 3"  (1.905 m).   Weight as of 05/25/23: 234 lb 12.8 oz (106.5 kg).   PERFORMANCE STATUS (ECOG) : 1 - Symptomatic but completely ambulatory   IMPRESSION:  I connected with [name] [name] by phone. No acute distress identified. Denies nausea, vomiting, constipation, or diarrhea. Appetite is fair. Some days better than others considering his overall condition. Reports he was scheduled to have some dental/oral work however appointment was cancelled. He is waiting for this to be rescheduled. [name] [name] states he is now only consuming mainly liquid diet with a small amount of soft foods.   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 He is currently taking oxycodone 5 mg every 6 hours as needed for pain, Advil in between, and flexeril as needed for spasms. Does not require oxycodone daily. We will continue current regimen with close follow-up.  Taking as directed. Refills appropriate past due indicating patient is taking responsibly.   We will continue to closely monitor and support as needed.   PLAN: Oxycodone as needed  for pain. Not requiring daily Flexeril as needed for muscle spasms We will continue to closely monitor and assist with symptom management. I will plan to see patient back in 6-8 weeks.  Patient expressed understanding and was in agreement with this plan. He also understands that He can call the clinic at any time with any questions, concerns, or complaints.      Any controlled substances utilized were prescribed in the context of palliative care. PDMP has been reviewed.    I provided 35 minutes of face-to-face video visit time during this encounter.   Visit consisted of counseling and education dealing with the complex and emotionally intense issues of symptom management and palliative care in the setting of serious and potentially life-threatening illness.  Willette Alma, AGPCNP-BC  Palliative Medicine Team/[facility]  *Please note that this is a verbal dictation therefore any spelling or grammatical errors are due to the "Dragon Medical One" system interpretation.

## 2023-07-08 ENCOUNTER — Encounter: Payer: Self-pay | Admitting: Nurse Practitioner

## 2023-07-08 ENCOUNTER — Inpatient Hospital Stay: Payer: Medicaid Other | Attending: Hematology and Oncology | Admitting: Nurse Practitioner

## 2023-07-08 DIAGNOSIS — Z515 Encounter for palliative care: Secondary | ICD-10-CM | POA: Diagnosis not present

## 2023-07-08 DIAGNOSIS — L598 Other specified disorders of the skin and subcutaneous tissue related to radiation: Secondary | ICD-10-CM

## 2023-07-08 DIAGNOSIS — Y842 Radiological procedure and radiotherapy as the cause of abnormal reaction of the patient, or of later complication, without mention of misadventure at the time of the procedure: Secondary | ICD-10-CM

## 2023-07-08 DIAGNOSIS — G893 Neoplasm related pain (acute) (chronic): Secondary | ICD-10-CM | POA: Diagnosis not present

## 2023-07-08 MED ORDER — CYCLOBENZAPRINE HCL 10 MG PO TABS: 10.0000 mg | ORAL_TABLET | Freq: Three times a day (TID) | ORAL | 2 refills | Status: AC | PRN

## 2023-07-22 ENCOUNTER — Telehealth: Payer: Self-pay | Admitting: Hematology and Oncology

## 2023-07-22 NOTE — Telephone Encounter (Signed)
 Rescheduled appointments per provider on call. Patient is aware of changes made to the appointments and will be mailed an appointment reminder.

## 2023-08-04 ENCOUNTER — Other Ambulatory Visit: Payer: Self-pay

## 2023-08-04 DIAGNOSIS — Y842 Radiological procedure and radiotherapy as the cause of abnormal reaction of the patient, or of later complication, without mention of misadventure at the time of the procedure: Secondary | ICD-10-CM

## 2023-08-04 DIAGNOSIS — G893 Neoplasm related pain (acute) (chronic): Secondary | ICD-10-CM

## 2023-08-04 DIAGNOSIS — Z515 Encounter for palliative care: Secondary | ICD-10-CM

## 2023-08-04 MED ORDER — OXYCODONE HCL 5 MG PO TABS: 5.0000 mg | ORAL_TABLET | Freq: Four times a day (QID) | ORAL | 0 refills | Status: DC | PRN

## 2023-08-04 NOTE — Telephone Encounter (Signed)
 Pt called for medication refill, see associated orders

## 2023-08-09 ENCOUNTER — Ambulatory Visit: Payer: Medicaid Other | Admitting: Internal Medicine

## 2023-08-09 ENCOUNTER — Ambulatory Visit: Payer: Self-pay | Admitting: Nurse Practitioner

## 2023-08-09 ENCOUNTER — Encounter: Payer: Self-pay | Admitting: Internal Medicine

## 2023-08-09 VITALS — BP 110/80 | HR 92 | Temp 97.8°F | Ht 75.0 in | Wt 234.0 lb

## 2023-08-09 DIAGNOSIS — R10814 Left lower quadrant abdominal tenderness: Secondary | ICD-10-CM

## 2023-08-09 MED ORDER — AMOXICILLIN-POT CLAVULANATE 600-42.9 MG/5ML PO SUSR: 7.5000 mL | Freq: Two times a day (BID) | ORAL | 0 refills | Status: DC

## 2023-08-09 NOTE — Telephone Encounter (Signed)
Chief Complaint: Lower Left abdominal pain Symptoms: pain Frequency: since 0300 this morning Pertinent Negatives: Patient denies nausea, vomiting, diarrhea Disposition: [] ED /[] Urgent Care (no appt availability in office) / [x] Appointment(In office/virtual)/ []  [facility]/ [] Home Care/ [] Refused Recommended Disposition /[] [facility]/ []  Follow-up with PCP Additional Notes: Patient called with significant other Misty Stanley) on the line reporting lower left abdominal pain that began around 0300. States he has a history of diverticulitis and this feels similar, but last episode was approx 10 years ago. Patient states pain subsides with rest and is aggravated with movement. Reports pain is 2/10 at this time while sitting. Per protocol, patient to be evaluated within 24 hours. Patient scheduled for 1600 today at patient request with any available provider. Care advice reviewed with patient, understanding verbalized. Denies further questions at this time. Alerting PCP for review.    Copied from CRM [id number]. Topic: Clinical - Red Word Triage >> Aug 09, 2023 10:56 AM Adele Barthel wrote: Red Word that prompted transfer to Nurse Triage: Patient is having lower left abdominal pain, 7 to 8 on pain scale. The pain comes and goes, since 3 am this morning. Has history of diverticulitis, feels similar to his past symptoms. Has not been eating due to dental issues and jaw issues. He is just drinking liquids, such as Carnation and milkshakes Reason for Disposition  [1] MODERATE pain (e.g., interferes with normal activities) AND [2] pain comes and goes (cramps) AND [3] present > 24 hours  (Exception: Pain with Vomiting or Diarrhea - see that Guideline.)  Answer Assessment - Initial Assessment Questions 1. LOCATION: "Where does it hurt?"      Lower Left abdominal pain 2. RADIATION: "Does the pain shoot anywhere else?" (e.g., chest, back)     Denies 3. ONSET: "When did the pain begin?" (Minutes,  hours or days ago)      0300 today 4. SUDDEN: "Gradual or sudden onset?"     Suddenly 5. PATTERN "Does the pain come and go, or is it constant?"    - If it comes and goes: "How long does it last?" "Do you have pain now?"     (Note: Comes and goes means the pain is intermittent. It goes away completely between bouts.)    - If constant: "Is it getting better, staying the same, or getting worse?"      (Note: Constant means the pain never goes away completely; most serious pain is constant and gets worse.)      Worse with activity, resolves with rest 6. SEVERITY: "How bad is the pain?"  (e.g., Scale 1-10; mild, moderate, or severe)    - MILD (1-3): Doesn't interfere with normal activities, abdomen soft and not tender to touch.     - MODERATE (4-7): Interferes with normal activities or awakens from sleep, abdomen tender to touch.     - SEVERE (8-10): Excruciating pain, doubled over, unable to do any normal activities.       2/10, with movement 8/10 7. RECURRENT SYMPTOM: "Have you ever had this type of stomach pain before?" If Yes, ask: "When was the last time?" and "What happened that time?"      Hx of diverticulitis, same pain approx 10 years ago 8. CAUSE: "What do you think is causing the stomach pain?"     Unsure 9. RELIEVING/AGGRAVATING FACTORS: "What makes it better or worse?" (e.g., antacids, bending or twisting motion, bowel movement)     Movement worsens pains- Last bowel movement two days ago.  10. OTHER SYMPTOMS: "Do you have any other symptoms?" (e.g., back pain, diarrhea, fever, urination pain, vomiting)       Denies  Protocols used: Abdominal Pain - Male-A-AH

## 2023-08-09 NOTE — Assessment & Plan Note (Signed)
Might have mild diverticulitis No tenderness on right Hernia is easily reducible and clearly not causing any problems Does feel some better now--will observe If worsens, can start augmentin ES 900mg  bid x 1 week

## 2023-08-09 NOTE — Telephone Encounter (Signed)
noted 

## 2023-08-09 NOTE — Progress Notes (Signed)
Subjective:    Patient ID: [name] [name], male    DOB: 01-11-1961, 63 y.o.   MRN: [id number]  HPI Here due to abdominal pain With wife  Having LLQ abdominal pain Started in middle of night last night Reminds him of past diverticulitis--diagnosed in urgent care without definitive diagnosis (but no diverticulosis on CT scan and never had colonoscopy)  Worse when moving around No N/V Not eating food--liquid diet mostly--but is taking (since RT for tonsillar cancer)--but sticking with his usual Last BM 2-3 days ago---not unusual for him  No fever  Current Outpatient Medications on File Prior to Visit  Medication Sig Dispense Refill   acetaminophen (TYLENOL) 325 MG tablet Take 2 tablets (650 mg total) by mouth every 6 (six) hours.     aspirin EC 81 MG EC tablet Take 1 tablet (81 mg total) by mouth daily. Swallow whole. 30 tablet 11   atorvastatin (LIPITOR) 20 MG tablet Take 1 tablet (20 mg total) by mouth daily. 90 tablet 1   cyclobenzaprine (FLEXERIL) 10 MG tablet Take 1 tablet (10 mg total) by mouth 3 (three) times daily as needed for muscle spasms. 60 tablet 2   gabapentin (NEURONTIN) 100 MG capsule Take by mouth.     ibuprofen (ADVIL) 200 MG tablet Take 200 mg by mouth every 6 (six) hours as needed.     levothyroxine (SYNTHROID) 25 MCG tablet TAKE 1 TABLET (25 MCG TOTAL) BY MOUTH DAILY BEFORE BREAKFAST. TAKE ON EMPTY STOMACH W/ WATER, 1 HOUR BEFORE OTHER MEDS OR FOOD. 90 tablet 0   metoprolol succinate (TOPROL-XL) 25 MG 24 hr tablet Take 1 tablet (25 mg total) by mouth daily. 90 tablet 1   oxyCODONE (OXY IR/ROXICODONE) 5 MG immediate release tablet Take 1 tablet (5 mg total) by mouth every 6 (six) hours as needed for severe pain (pain score 7-10). 90 tablet 0   pantoprazole (PROTONIX) 40 MG tablet Take 1 tablet (40 mg total) by mouth 2 (two) times daily. 60 tablet 1   polyethylene glycol (MIRALAX / GLYCOLAX) 17 g packet Take 17 g by mouth 2 (two) times daily. 14 each 0   rivaroxaban  (XARELTO) 20 MG TABS tablet Take 1 tablet (20 mg total) by mouth daily with supper. 90 tablet 2   No current facility-administered medications on file prior to visit.    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   Right SFA 05/07/17 while on warfarin INR 2.9   Atrial fibrillation (HCC)    Benign essential HTN 01/04/2012   Chronic anticoagulation 01/02/2013   Dermatofibroma of forearm 01/02/2013   Left side   DVT, lower extremity, proximal (HCC) 05/24/2013   Right leg femoral & popliteal 05/24/13   Hyperlipidemia, mixed 01/04/2012   Hypothyroidism    Pneumonia    Polycythemia secondary to smoking 01/04/2012   Primary hypercoagulable state (HCC) 10/01/2014   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HCC)    Sinus bradycardia, chronic 01/04/2012   Superficial thrombosis of lower extremity 05/02/2012    Past Surgical History:  Procedure Laterality Date   ABDOMINAL AORTOGRAM W/LOWER EXTREMITY Right 11/10/2021   Procedure: ABDOMINAL AORTOGRAM W/LOWER EXTREMITY;  Surgeon: Maeola Harman, MD;  Location: [facility] INVASIVE CV LAB;  Service: Cardiovascular;  Laterality: Right;   AORTA - BILATERAL FEMORAL ARTERY BYPASS GRAFT Bilateral 11/26/2021   Procedure: AORTO- BIILIAC ARTERY  BYPASS GRAFT USING HEMASHIELD 20 X , RIGHT ILIO-FEMORAL BYPASS  UMBILICAL HERNIA REPAIR;  Surgeon: Leonie Douglas, MD;  Location: [facility] OR;  Service: Vascular;  Laterality: Bilateral;   APPLICATION OF WOUND VAC Right 11/26/2021   Procedure: APPLICATION OF WOUND VAC;  Surgeon: Leonie Douglas, MD;  Location: [facility] OR;  Service: Vascular;  Laterality: Right;   DIRECT LARYNGOSCOPY Left 10/19/2018   Procedure: DIRECT  LARYNGOSCOPY;  Surgeon: Newman Pies, MD;  Location: [facility];  Service: ENT;  Laterality: Left;   FEMORAL-TIBIAL BYPASS GRAFT Right 11/26/2021   Procedure: RIGHT FEMORALTIBIAL  ARTERY BYPASS GRAFT USING PROPATEN GRAFT;  Surgeon: Leonie Douglas, MD;  Location: [facility] OR;  Service: Vascular;  Laterality: Right;   IR IMAGING GUIDED PORT INSERTION  11/04/2018   IR REMOVAL TUN ACCESS W/ PORT W/O FL MOD SED  05/01/2019   TONSILLECTOMY Left 10/19/2018   Procedure: BIOPSY OF LEFT TONSIL;  Surgeon: Newman Pies, MD;  Location:  SURGERY CENTER;  Service: ENT;  Laterality: Left;    Family History  Problem Relation Age of Onset   Stroke Father     Social History   Socioeconomic History   Marital status: Divorced    Spouse name: Not on file   Number of children: 2   Years of education: Not on file   Highest education level: Bachelor's degree (e.g., BA, AB, BS)  Occupational History   Not on file  Tobacco Use   Smoking status: Former    Current packs/day: 0.00    Average packs/day: 1 pack/day for 30.0 years (30.0 ttl pk-yrs)    Types: Cigarettes    Start date: 10/18/1988    Quit date: 10/19/2018    Years since quitting: 4.8    Passive exposure: Never (1 a week)   Smokeless tobacco: Never  Vaping Use   Vaping status: Former   Substances: Nicotine, Flavoring   Devices: 1 cartridge q2-3 days, he tells me he is not using.   Substance and Sexual Activity   Alcohol use: Not Currently    Comment: occasionally. once a week   Drug use: Not Currently    Types: Marijuana   Sexual activity: Not on file  Other Topics Concern   Not on file  Social History Narrative   Not on file   Social Drivers of Health   Financial Resource Strain: Low Risk  (05/12/2023)   Overall Financial Resource Strain (CARDIA)    Difficulty of Paying Living Expenses: Not very hard  Food Insecurity: No Food Insecurity (05/12/2023)   Hunger Vital Sign    Worried About Running Out of Food in the Last Year:  Never true    Ran Out of Food in the Last Year: Never true  Transportation Needs: No Transportation Needs (05/12/2023)   PRAPARE - Administrator, Civil Service (Medical): No    Lack of Transportation (Non-Medical): No  Physical Activity: Not on file  Stress: Not on file  Social Connections: Unknown (05/12/2023)   Social Connection and Isolation Panel [NHANES]    Frequency of Communication with Friends and Family: Twice a week    Frequency of Social Gatherings with Friends and Family: Once a week    Attends Religious Services: Patient declined    Database administrator or Organizations: Patient declined    Attends Banker Meetings: Not  on file    Marital Status: Living with partner  Intimate Partner Violence: Not At Risk (10/21/2018)   Humiliation, Afraid, Rape, and Kick questionnaire    Fear of Current or Ex-Partner: No    Emotionally Abused: No    Physically Abused: No    Sexually Abused: No   Review of Systems No dysuria No urinary urgency Ventral hernia--may be getting some bigger--no pain there    Objective:   Physical Exam Constitutional:      Appearance: He is well-developed.  Pulmonary:     Breath sounds: No wheezing or rales.     Comments: Decreased breath sounds but clear Abdominal:     General: Bowel sounds are normal.     Palpations: Abdomen is soft.     Tenderness: There is no guarding or rebound.     Comments: Moderate reducible hernia periumbilical--no tenderness Mild LLQ tenderness but not bad on deep palpation  Musculoskeletal:     Cervical back: Neck supple.  Lymphadenopathy:     Cervical: No cervical adenopathy.  Neurological:     Mental Status: He is alert.            Assessment & Plan:

## 2023-08-10 ENCOUNTER — Other Ambulatory Visit: Payer: Self-pay | Admitting: Hematology and Oncology

## 2023-08-10 DIAGNOSIS — C090 Malignant neoplasm of tonsillar fossa: Secondary | ICD-10-CM

## 2023-08-11 ENCOUNTER — Encounter: Payer: Self-pay | Admitting: Hematology

## 2023-08-26 ENCOUNTER — Ambulatory Visit: Payer: Medicaid Other | Admitting: Hematology and Oncology

## 2023-08-26 ENCOUNTER — Other Ambulatory Visit: Payer: Medicaid Other

## 2023-09-03 ENCOUNTER — Other Ambulatory Visit: Payer: Self-pay

## 2023-09-03 DIAGNOSIS — Z515 Encounter for palliative care: Secondary | ICD-10-CM

## 2023-09-03 DIAGNOSIS — Y842 Radiological procedure and radiotherapy as the cause of abnormal reaction of the patient, or of later complication, without mention of misadventure at the time of the procedure: Secondary | ICD-10-CM

## 2023-09-03 DIAGNOSIS — G893 Neoplasm related pain (acute) (chronic): Secondary | ICD-10-CM

## 2023-09-03 MED ORDER — OXYCODONE HCL 5 MG PO TABS: 5.0000 mg | ORAL_TABLET | Freq: Four times a day (QID) | ORAL | 0 refills | Status: DC | PRN

## 2023-09-03 NOTE — Telephone Encounter (Signed)
Pt called for refill, see associated orders.

## 2023-09-03 NOTE — Progress Notes (Deleted)
 Palliative Medicine [facility]  [phone] [phone]   Name: [name] [name] Date: 09/03/2023 MRN: [id number]  DOB: Feb 15, 1961  Patient Care Team: Eden Emms, NP as PCP - General (Nurse Practitioner) Levert Feinstein, MD as Consulting Physician (Oncology) Newman Pies, MD as Consulting Physician (Otolaryngology) Lonie Peak, MD as Attending Physician (Radiation Oncology) Arthur Holms, MD (Inactive) as Consulting Physician (Hematology) Barrie Folk, RN (Inactive) as Oncology Nurse Navigator Pickenpack-Cousar, Arty Baumgartner, NP as Nurse Practitioner (Nurse Practitioner)   INTERVAL HISTORY: [name] [name] is a 63 y.o. 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 Palliative ask to see for symptom management.    SOCIAL HISTORY:     reports that he quit smoking about 4 years ago. His smoking use included cigarettes. He started smoking about 34 years ago. He has a 30 pack-year smoking history. He has never been exposed to tobacco smoke. He has never used smokeless tobacco. He reports that he does not currently use alcohol. He reports that he does not currently use drugs after having used the following drugs: Marijuana.  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   Right SFA 05/07/17 while on warfarin INR 2.9   Atrial fibrillation (HCC)    Benign essential HTN 01/04/2012   Chronic anticoagulation 01/02/2013   Dermatofibroma of forearm 01/02/2013   Left side   DVT, lower extremity, proximal (HCC) 05/24/2013   Right leg femoral  & popliteal 05/24/13   Hyperlipidemia, mixed 01/04/2012   Hypothyroidism    Pneumonia    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  MEDICATIONS:  Current Outpatient Medications  Medication Sig Dispense Refill   acetaminophen (TYLENOL) 325 MG tablet Take 2 tablets (650 mg total) by mouth every 6 (six) hours.     amoxicillin-clavulanate (AUGMENTIN) 600-42.9 MG/5ML suspension Take 7.5 mLs by mouth 2 (two) times daily. 125 mL 0   aspirin EC 81 MG EC tablet Take 1 tablet (81 mg total) by mouth daily. Swallow whole. 30 tablet 11   atorvastatin (LIPITOR) 20 MG tablet Take 1 tablet (20 mg total) by mouth daily. 90 tablet 1   cyclobenzaprine (FLEXERIL) 10 MG tablet Take 1 tablet (10 mg total) by mouth 3 (three) times daily as needed for muscle spasms. 60 tablet 2   gabapentin (NEURONTIN) 100 MG capsule Take by mouth.     ibuprofen (ADVIL) 200 MG tablet Take 200 mg by mouth every 6 (six) hours as needed.     levothyroxine (SYNTHROID) 25 MCG tablet TAKE 1 TABLET (25 MCG TOTAL) BY MOUTH DAILY BEFORE BREAKFAST. TAKE ON EMPTY STOMACH W/ WATER, 1 HOUR BEFORE OTHER MEDS OR FOOD. 90 tablet 0   metoprolol succinate (TOPROL-XL) 25 MG 24 hr tablet Take 1 tablet (25 mg total) by mouth daily. 90 tablet 1   oxyCODONE (OXY IR/ROXICODONE) 5 MG immediate release tablet Take 1 tablet (  5 mg total) by mouth every 6 (six) hours as needed for severe pain (pain score 7-10). 90 tablet 0   pantoprazole (PROTONIX) 40 MG tablet Take 1 tablet (40 mg total) by mouth 2 (two) times daily. 60 tablet 1   polyethylene glycol (MIRALAX / GLYCOLAX) 17 g packet Take 17 g by mouth 2 (two) times daily. 14 each 0   rivaroxaban (XARELTO) 20 MG TABS tablet Take 1 tablet (20 mg total) by mouth daily with  supper. 90 tablet 2   No current facility-administered medications for this visit.    VITAL SIGNS: There were no vitals taken for this visit. There were no vitals filed for this visit.  Estimated body mass index is 29.25 kg/m as calculated from the following:   Height as of 08/09/23: 6\' 3"  (1.905 m).   Weight as of 08/09/23: 234 lb (106.1 kg).   PERFORMANCE STATUS (ECOG) : 1 - Symptomatic but completely ambulatory   IMPRESSION:  I connected with [name] [name] by phone. No acute distress identified. Denies nausea, vomiting, constipation, or diarrhea. Appetite is fair. Some days better than others considering his overall condition. Reports he is waiting for follow-up with  [facility] Continues to consume mostly soft diet and liquids. Some increase in pains and spasms given cold weather. Taking things one day at a time.   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 He is currently taking oxycodone 5 mg every 6 hours as needed for pain, Advil in between, and flexeril as needed for spasms. Does not require oxycodone daily. We will continue current regimen with close follow-up.  Taking as directed. Refills appropriate past due indicating patient is taking responsibly.   We will continue to closely monitor and support as needed.   PLAN: Oxycodone as needed for pain. Not requiring daily Flexeril as needed for muscle spasms We will continue to closely monitor and assist with symptom management. I will plan to see patient back in 6-8 weeks.  Patient expressed understanding and was in agreement with this plan. He also understands that He can call the clinic at any time with any questions, concerns, or complaints.      Any controlled substances utilized were prescribed in the context of palliative care. PDMP has been reviewed.    I provided 35 minutes of face-to-face video visit time during this encounter.   Visit consisted of counseling and education dealing  with the complex and emotionally intense issues of symptom management and palliative care in the setting of serious and potentially life-threatening illness.  Willette Alma, AGPCNP-BC  Palliative Medicine Team/[facility]

## 2023-09-08 ENCOUNTER — Other Ambulatory Visit: Payer: Self-pay | Admitting: Hematology and Oncology

## 2023-09-08 DIAGNOSIS — C090 Malignant neoplasm of tonsillar fossa: Secondary | ICD-10-CM

## 2023-09-08 NOTE — Progress Notes (Signed)
 Rescheduled

## 2023-09-09 ENCOUNTER — Inpatient Hospital Stay: Payer: Medicaid Other | Admitting: Nurse Practitioner

## 2023-09-09 ENCOUNTER — Inpatient Hospital Stay: Payer: Medicaid Other | Admitting: Hematology and Oncology

## 2023-09-09 ENCOUNTER — Telehealth: Payer: Self-pay | Admitting: Hematology and Oncology

## 2023-09-09 ENCOUNTER — Inpatient Hospital Stay: Payer: Medicaid Other

## 2023-09-09 DIAGNOSIS — C090 Malignant neoplasm of tonsillar fossa: Secondary | ICD-10-CM

## 2023-09-09 DIAGNOSIS — G893 Neoplasm related pain (acute) (chronic): Secondary | ICD-10-CM

## 2023-09-09 NOTE — Telephone Encounter (Signed)
 Marland Kitchen

## 2023-09-10 ENCOUNTER — Telehealth: Payer: Self-pay | Admitting: Hematology and Oncology

## 2023-09-14 NOTE — Progress Notes (Unsigned)
 Palliative Medicine [facility]  Telephone:([phone] Fax:(336) [phone]   Name: [name] [name] Date: 09/14/2023 MRN: [id number]  DOB: [birth year]  Patient Care Team: Eden Emms, NP as PCP - General (Nurse Practitioner) Levert Feinstein, MD as Consulting Physician (Oncology) Newman Pies, MD as Consulting Physician (Otolaryngology) Lonie Peak, MD as Attending Physician (Radiation Oncology) Arthur Holms, MD (Inactive) as Consulting Physician (Hematology) Barrie Folk, RN (Inactive) as Oncology Nurse Navigator Pickenpack-Cousar, Arty Baumgartner, NP as Nurse Practitioner (Nurse Practitioner)   INTERVAL HISTORY: DAMIEN BATTY is a 63 y.o. 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 Palliative ask to see for symptom management.    SOCIAL HISTORY:     reports that he quit smoking about 4 years ago. His smoking use included cigarettes. He started smoking about 34 years ago. He has a 30 pack-year smoking history. He has never been exposed to tobacco smoke. He has never used smokeless tobacco. He reports that he does not currently use alcohol. He reports that he does not currently use drugs after having used the following drugs: Marijuana.  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   Right SFA 05/07/17 while on warfarin INR 2.9   Atrial fibrillation (HCC)    Benign essential HTN 01/04/2012   Chronic anticoagulation 01/02/2013   Dermatofibroma of forearm 01/02/2013   Left side   DVT, lower extremity, proximal (HCC) 05/24/2013   Right leg femoral  & popliteal 05/24/13   Hyperlipidemia, mixed 01/04/2012   Hypothyroidism    Pneumonia    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  MEDICATIONS:  Current Outpatient Medications  Medication Sig Dispense Refill   acetaminophen (TYLENOL) 325 MG tablet Take 2 tablets (650 mg total) by mouth every 6 (six) hours.     amoxicillin-clavulanate (AUGMENTIN) 600-42.9 MG/5ML suspension Take 7.5 mLs by mouth 2 (two) times daily. 125 mL 0   aspirin EC 81 MG EC tablet Take 1 tablet (81 mg total) by mouth daily. Swallow whole. 30 tablet 11   atorvastatin (LIPITOR) 20 MG tablet Take 1 tablet (20 mg total) by mouth daily. 90 tablet 1   cyclobenzaprine (FLEXERIL) 10 MG tablet Take 1 tablet (10 mg total) by mouth 3 (three) times daily as needed for muscle spasms. 60 tablet 2   gabapentin (NEURONTIN) 100 MG capsule Take by mouth.     ibuprofen (ADVIL) 200 MG tablet Take 200 mg by mouth every 6 (six) hours as needed.     levothyroxine (SYNTHROID) 25 MCG tablet TAKE 1 TABLET (25 MCG TOTAL) BY MOUTH DAILY BEFORE BREAKFAST. TAKE ON EMPTY STOMACH W/ WATER, 1 HOUR BEFORE OTHER MEDS OR FOOD. 90 tablet 0   metoprolol succinate (TOPROL-XL) 25 MG 24 hr tablet Take 1 tablet (25 mg total) by mouth daily. 90 tablet 1   oxyCODONE (OXY IR/ROXICODONE) 5 MG immediate release tablet Take 1 tablet (  5 mg total) by mouth every 6 (six) hours as needed for severe pain (pain score 7-10). 90 tablet 0   pantoprazole (PROTONIX) 40 MG tablet Take 1 tablet (40 mg total) by mouth 2 (two) times daily. 60 tablet 1   polyethylene glycol (MIRALAX / GLYCOLAX) 17 g packet Take 17 g by mouth 2 (two) times daily. 14 each 0   rivaroxaban (XARELTO) 20 MG TABS tablet Take 1 tablet (20 mg total) by mouth daily with  supper. 90 tablet 2   No current facility-administered medications for this visit.    VITAL SIGNS: There were no vitals taken for this visit. There were no vitals filed for this visit.  Estimated body mass index is 29.25 kg/m as calculated from the following:   Height as of 08/09/23: 6\' 3"  (1.905 m).   Weight as of 08/09/23: 234 lb (106.1 kg).   PERFORMANCE STATUS (ECOG) : 1 - Symptomatic but completely ambulatory  Assessment NAD RRR Normal breathing pattern AAO x4  IMPRESSION:  Mr. Woodyard presents to clinic for follow-up. No acute distress identified. Denies nausea, vomiting, constipation, or diarrhea. Appetite is fair. Some days better than others considering his overall condition. Continues to consume mostly soft diet and liquids. Some increase in pains and spasms given cold weather. Taking things one day at a time. His weight is down from previous visits 244lbs on 09/08/22, 237lbs on 02/2023, current weight is 232lbs. The patient attributes this to inability to tolerate most solid foods.   He is reporting that he has a follow-up scheduled with [facility] on next week. Advised to keep in order to gain further management of oral needs.    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 He is currently taking oxycodone 5 mg every 6 hours as needed for pain, Advil in between, and flexeril as needed for spasms. Does not require oxycodone daily. We will continue current regimen with close follow-up.  Taking as directed. Refills appropriate with prescription lasting greater than 45 days indicating patient is taking responsibly and as needed only.   We will continue to closely monitor and support as needed.   PLAN: Oxycodone as needed for pain. Not requiring daily Flexeril as needed for muscle spasms We will continue to closely monitor and assist with symptom management. I will plan to see patient back in 6-8 weeks.  Patient expressed understanding and  was in agreement with this plan. He also understands that He can call the clinic at any time with any questions, concerns, or complaints.      Any controlled substances utilized were prescribed in the context of palliative care. PDMP has been reviewed.   Visit consisted of counseling and education dealing with the complex and emotionally intense issues of symptom management and palliative care in the setting of serious and potentially life-threatening illness.  Willette Alma, AGPCNP-BC  Palliative Medicine Team/[facility]

## 2023-09-15 ENCOUNTER — Inpatient Hospital Stay (HOSPITAL_BASED_OUTPATIENT_CLINIC_OR_DEPARTMENT_OTHER): Payer: Medicaid Other | Admitting: Nurse Practitioner

## 2023-09-15 ENCOUNTER — Inpatient Hospital Stay: Payer: Medicaid Other | Attending: Hematology and Oncology

## 2023-09-15 ENCOUNTER — Inpatient Hospital Stay: Payer: Medicaid Other | Admitting: Hematology and Oncology

## 2023-09-15 ENCOUNTER — Encounter: Payer: Self-pay | Admitting: Hematology

## 2023-09-15 ENCOUNTER — Encounter: Payer: Self-pay | Admitting: Nurse Practitioner

## 2023-09-15 VITALS — BP 126/91 | HR 85 | Temp 98.2°F | Resp 17 | Wt 232.8 lb

## 2023-09-15 DIAGNOSIS — C099 Malignant neoplasm of tonsil, unspecified: Secondary | ICD-10-CM | POA: Diagnosis not present

## 2023-09-15 DIAGNOSIS — R630 Anorexia: Secondary | ICD-10-CM

## 2023-09-15 DIAGNOSIS — Z79899 Other long term (current) drug therapy: Secondary | ICD-10-CM | POA: Insufficient documentation

## 2023-09-15 DIAGNOSIS — Z86718 Personal history of other venous thrombosis and embolism: Secondary | ICD-10-CM | POA: Insufficient documentation

## 2023-09-15 DIAGNOSIS — G893 Neoplasm related pain (acute) (chronic): Secondary | ICD-10-CM | POA: Diagnosis not present

## 2023-09-15 DIAGNOSIS — C090 Malignant neoplasm of tonsillar fossa: Secondary | ICD-10-CM | POA: Diagnosis not present

## 2023-09-15 DIAGNOSIS — Z923 Personal history of irradiation: Secondary | ICD-10-CM | POA: Diagnosis not present

## 2023-09-15 DIAGNOSIS — E039 Hypothyroidism, unspecified: Secondary | ICD-10-CM | POA: Diagnosis not present

## 2023-09-15 DIAGNOSIS — Z87891 Personal history of nicotine dependence: Secondary | ICD-10-CM | POA: Insufficient documentation

## 2023-09-15 DIAGNOSIS — Z515 Encounter for palliative care: Secondary | ICD-10-CM | POA: Diagnosis not present

## 2023-09-15 LAB — CMP (CANCER CENTER ONLY)
ALT: 17 U/L (ref 0–44)
AST: 24 U/L (ref 15–41)
Albumin: 4.3 g/dL (ref 3.5–5.0)
Alkaline Phosphatase: 106 U/L (ref 38–126)
Anion gap: 9 (ref 5–15)
BUN: 8 mg/dL (ref 8–23)
CO2: 26 mmol/L (ref 22–32)
Calcium: 9.9 mg/dL (ref 8.9–10.3)
Chloride: 104 mmol/L (ref 98–111)
Creatinine: 1.05 mg/dL (ref 0.61–1.24)
GFR, Estimated: >60 mL/min (ref >60)
Glucose, Bld: 112 mg/dL — ABNORMAL HIGH (ref 70–99)
Potassium: 3.7 mmol/L (ref 3.5–5.1)
Sodium: 139 mmol/L (ref 135–145)
Total Bilirubin: 0.9 mg/dL (ref 0.0–1.2)
Total Protein: 7.3 g/dL (ref 6.5–8.1)

## 2023-09-15 LAB — CBC WITH DIFFERENTIAL (CANCER CENTER ONLY)
Abs Immature Granulocytes: 0.01 10*3/uL (ref 0.00–0.07)
Basophils Absolute: 0.1 10*3/uL (ref 0.0–0.1)
Basophils Relative: 1 %
Eosinophils Absolute: 0.2 10*3/uL (ref 0.0–0.5)
Eosinophils Relative: 3 %
HCT: 48.6 % (ref 39.0–52.0)
Hemoglobin: 16.4 g/dL (ref 13.0–17.0)
Immature Granulocytes: 0 %
Lymphocytes Relative: 19 %
Lymphs Abs: 1.2 10*3/uL (ref 0.7–4.0)
MCH: 32.3 pg (ref 26.0–34.0)
MCHC: 33.7 g/dL (ref 30.0–36.0)
MCV: 95.7 fL (ref 80.0–100.0)
Monocytes Absolute: 0.6 10*3/uL (ref 0.1–1.0)
Monocytes Relative: 9 %
Neutro Abs: 4.2 10*3/uL (ref 1.7–7.7)
Neutrophils Relative %: 68 %
Platelet Count: 160 10*3/uL (ref 150–400)
RBC: 5.08 MIL/uL (ref 4.22–5.81)
RDW: 13.5 % (ref 11.5–15.5)
WBC Count: 6.2 10*3/uL (ref 4.0–10.5)
nRBC: 0 % (ref 0.0–0.2)

## 2023-09-15 LAB — TSH: TSH: 10.839 u[IU]/mL — ABNORMAL HIGH (ref 0.350–4.500)

## 2023-09-15 NOTE — Progress Notes (Signed)
 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 Suszanne [name], invasive SCCa, p16+; not a candidate for TORS -Late 10/2018 - 12/2018: definitive chemoradiation with weekly cisplatin  03/2019: end-of-treatment PET showed decrease in FDG avidity in the left tonsil but some residual soft tissue fullness, resolution of left LN disease   -2021:  Diffuse pharyngeal soft tissue thickening at the tongue base on multiple CT's  Necrotic tissue on multiple biopsies at [facility], suggestive of radionecrosis; no evidence of malignancy    #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  Started on Lovenox 1 mg/kg every 12 hours. 02/21/2021: Transition to Xarelto therapy.  Interval History:  [name] [name] 63 y.o. male returns for a follow up for history of recurrent VTE and history of stage 1 SCC of tonsil. He was last seen by Dr. Leonides [name] on 09/08/2022. He is unaccompanied for this visit.   [name] [name] reports he has been well overall in the interim since our last visit.  He reports that he has had a prolonged workup and evaluation with oral surgery and dentistry.  He notes that he is scheduled fortunately to have his teeth removed next week to begin the process of having his jaw surgery performed.  He reports he has been faithfully taking his Xarelto therapy with no bleeding, bruising, or dark stools.  He notes that he takes his thyroid medications as well as his blood pressure.  He does take periodic oxycodone for the pain in his jaw.  He notes that his hydration and nutrition have remained good and his weight has remained stable over the last 3 months.  Otherwise he is had no major changes in his health with no recent infectious symptoms. He denies any fevers, chills, night sweats, shortness of breath, chest pain or cough. He has no other complaints. A full 10 point ROS is listed below.  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   Right SFA 05/07/17 while on warfarin INR 2.9   Atrial fibrillation (HCC)  Benign essential HTN 01/04/2012   Chronic anticoagulation 01/02/2013   Dermatofibroma of forearm 01/02/2013   Left side   DVT, lower extremity, proximal (HCC) 05/24/2013   Right leg femoral & popliteal 05/24/13   Hyperlipidemia, mixed 01/04/2012   Hypothyroidism     Pneumonia    Polycythemia secondary to smoking 01/04/2012   Primary hypercoagulable state (HCC) 10/01/2014   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HCC)    Sinus bradycardia, chronic 01/04/2012   Superficial thrombosis of lower extremity 05/02/2012    SURGICAL HISTORY: Past Surgical History:  Procedure Laterality Date   ABDOMINAL AORTOGRAM W/LOWER EXTREMITY Right 11/10/2021   Procedure: ABDOMINAL AORTOGRAM W/LOWER EXTREMITY;  Surgeon: [name] Harman, MD;  Location: [name];  Service: Cardiovascular;  Laterality: Right;   AORTA - BILATERAL FEMORAL ARTERY BYPASS GRAFT Bilateral 11/26/2021   Procedure: AORTO- BIILIAC ARTERY  BYPASS GRAFT USING HEMASHIELD 20 X , RIGHT ILIO-FEMORAL BYPASS  UMBILICAL HERNIA REPAIR;  Surgeon: [name] Douglas, MD;  Location: [facility] OR;  Service: Vascular;  Laterality: Bilateral;   APPLICATION OF WOUND VAC Right 11/26/2021   Procedure: APPLICATION OF WOUND VAC;  Surgeon: [name] Douglas, MD;  Location: [facility] OR;  Service: Vascular;  Laterality: Right;   DIRECT LARYNGOSCOPY Left 10/19/2018   Procedure: DIRECT LARYNGOSCOPY;  Surgeon: [name] Pies, MD;  Location: [name];  Service: ENT;  Laterality: Left;   FEMORAL-TIBIAL BYPASS GRAFT Right 11/26/2021   Procedure: RIGHT FEMORALTIBIAL  ARTERY BYPASS GRAFT USING PROPATEN GRAFT;  Surgeon: [name] Douglas, MD;  Location: [facility] OR;  Service: Vascular;  Laterality: Right;   IR IMAGING GUIDED PORT INSERTION  11/04/2018   IR REMOVAL TUN ACCESS W/ PORT W/O FL MOD SED  05/01/2019   TONSILLECTOMY Left 10/19/2018   Procedure: BIOPSY OF LEFT TONSIL;  Surgeon: [name] Pies, MD;  Location:  [name];  Service: ENT;  Laterality: Left;    SOCIAL HISTORY: Social History   Socioeconomic History   Marital status: Divorced    Spouse name: Not on file   Number of children: 2   Years of education: Not on file   Highest education level: Bachelor's degree (e.g., BA, AB, BS)  Occupational  History   Not on file  Tobacco Use   Smoking status: Former    Current packs/day: 0.00    Average packs/day: 1 pack/day for 30.0 years (30.0 ttl pk-yrs)    Types: Cigarettes    Start date: 10/18/1988    Quit date: 10/19/2018    Years since quitting: 4.9    Passive exposure: Never (1 a week)   Smokeless tobacco: Never  Vaping Use   Vaping status: Former   Substances: Nicotine, Flavoring   Devices: 1 cartridge q2-3 days, he tells me he is not using.   Substance and Sexual Activity   Alcohol use: Not Currently    Comment: occasionally. once a week   Drug use: Not Currently    Types: Marijuana   Sexual activity: Not on file  Other Topics Concern   Not on file  Social History Narrative   Not on file   Social Drivers of Health   Financial Resource Strain: Low Risk  (05/12/2023)   Overall Financial Resource Strain (CARDIA)    Difficulty of Paying Living Expenses: Not very hard  Food Insecurity: No Food Insecurity (05/12/2023)   Hunger Vital Sign    Worried About Running Out of Food in the Last Year: Never true    Ran Out of Food in the  Last Year: Never true  Transportation Needs: No Transportation Needs (05/12/2023)   PRAPARE - Administrator, Civil Service (Medical): No    Lack of Transportation (Non-Medical): No  Physical Activity: Not on file  Stress: Not on file  Social Connections: Unknown (05/12/2023)   Social Connection and Isolation Panel [NHANES]    Frequency of Communication with Friends and Family: Twice a week    Frequency of Social Gatherings with Friends and Family: Once a week    Attends Religious Services: Patient declined    Database administrator or Organizations: Patient declined    Attends Banker Meetings: Not on file    Marital Status: Living with partner  Intimate Partner Violence: Not At Risk (10/21/2018)   Humiliation, Afraid, Rape, and Kick questionnaire    Fear of Current or Ex-Partner: No    Emotionally Abused: No     Physically Abused: No    Sexually Abused: No    FAMILY HISTORY: Family History  Problem Relation Age of Onset   Stroke Father     ALLERGIES:  has no known allergies.  MEDICATIONS:  Current Outpatient Medications  Medication Sig Dispense Refill   acetaminophen (TYLENOL) 325 MG tablet Take 2 tablets (650 mg total) by mouth every 6 (six) hours.     amoxicillin-clavulanate (AUGMENTIN) 600-42.9 MG/5ML suspension Take 7.5 mLs by mouth 2 (two) times daily. 125 mL 0   aspirin EC 81 MG EC tablet Take 1 tablet (81 mg total) by mouth daily. Swallow whole. 30 tablet 11   atorvastatin (LIPITOR) 20 MG tablet Take 1 tablet (20 mg total) by mouth daily. 90 tablet 1   cyclobenzaprine (FLEXERIL) 10 MG tablet Take 1 tablet (10 mg total) by mouth 3 (three) times daily as needed for muscle spasms. 60 tablet 2   gabapentin (NEURONTIN) 100 MG capsule Take by mouth.     ibuprofen (ADVIL) 200 MG tablet Take 200 mg by mouth every 6 (six) hours as needed.     levothyroxine (SYNTHROID) 25 MCG tablet TAKE 1 TABLET (25 MCG TOTAL) BY MOUTH DAILY BEFORE BREAKFAST. TAKE ON EMPTY STOMACH W/ WATER, 1 HOUR BEFORE OTHER MEDS OR FOOD. 90 tablet 0   metoprolol succinate (TOPROL-XL) 25 MG 24 hr tablet Take 1 tablet (25 mg total) by mouth daily. 90 tablet 1   oxyCODONE (OXY IR/ROXICODONE) 5 MG immediate release tablet Take 1 tablet (5 mg total) by mouth every 6 (six) hours as needed for severe pain (pain score 7-10). 90 tablet 0   pantoprazole (PROTONIX) 40 MG tablet Take 1 tablet (40 mg total) by mouth 2 (two) times daily. 60 tablet 1   polyethylene glycol (MIRALAX / GLYCOLAX) 17 g packet Take 17 g by mouth 2 (two) times daily. 14 each 0   rivaroxaban (XARELTO) 20 MG TABS tablet Take 1 tablet (20 mg total) by mouth daily with supper. 90 tablet 2   No current facility-administered medications for this visit.    REVIEW OF SYSTEMS:   Constitutional: ( - ) fevers, ( - )  chills , ( - ) night sweats Eyes: ( - ) blurriness of  vision, ( - ) double vision, ( - ) watery eyes Ears, nose, mouth, throat, and face: ( - ) mucositis, ( - ) sore throat Respiratory: ( - ) cough, ( - ) dyspnea, ( - ) wheezes Cardiovascular: ( - ) palpitation, ( - ) chest discomfort, ( - ) lower extremity swelling Gastrointestinal:  ( - ) nausea, ( - )  heartburn, ( - ) change in bowel habits Skin: ( - ) abnormal skin rashes Lymphatics: ( - ) new lymphadenopathy, ( - ) easy bruising Neurological: ( - ) numbness, ( - ) tingling, ( - ) new weaknesses Behavioral/Psych: ( - ) mood change, ( - ) new changes  All other systems were reviewed with the patient and are negative.  PHYSICAL EXAMINATION: ECOG PERFORMANCE STATUS: 1 - Symptomatic but completely ambulatory  There were no vitals filed for this visit.  There were no vitals filed for this visit.   GENERAL: well appearing middle aged Caucasian male alert, no distress and comfortable SKIN: skin color, texture, turgor are normal, no rashes or significant lesions EYES: conjunctiva are pink and non-injected, sclera clear OROPHARYNX: exam limited as patient cannot open his mouth wide. Thick secretions note.  LUNGS: clear to auscultation and percussion with normal breathing effort HEART: regular rate & rhythm and no murmurs and no lower extremity edema PSYCH: alert & oriented x 3, fluent speech NEURO: no focal motor/sensory deficits  LABORATORY DATA:  I have reviewed the data as listed    Latest Ref Rng & Units 09/15/2023    2:09 PM 05/27/2023    3:26 PM 02/18/2023    2:54 PM  CBC  WBC 4.0 - 10.5 K/uL 6.2  7.5  8.0   Hemoglobin 13.0 - 17.0 g/dL 16.1  09.6  04.5   Hematocrit 39.0 - 52.0 % 48.6  50.5  48.3   Platelets 150 - 400 K/uL 160  236.0  206        Latest Ref Rng & Units 09/15/2023    2:09 PM 05/27/2023    3:26 PM 02/18/2023    2:54 PM  CMP  Glucose 70 - 99 mg/dL 409  98  811   BUN 8 - 23 mg/dL 8  10  9    Creatinine 0.61 - 1.24 mg/dL 9.14  7.82  9.56   Sodium 135 - 145 mmol/L 139   141  141   Potassium 3.5 - 5.1 mmol/L 3.7  4.0  4.3   Chloride 98 - 111 mmol/L 104  101  103   CO2 22 - 32 mmol/L 26  29  29    Calcium 8.9 - 10.3 mg/dL 9.9  9.7  9.9   Total Protein 6.5 - 8.1 g/dL 7.3  6.9  7.2   Total Bilirubin 0.0 - 1.2 mg/dL 0.9  0.9  0.8   Alkaline Phos 38 - 126 U/L 106  100  110   AST 15 - 41 U/L 24  20  26    ALT 0 - 44 U/L 17  16  18      RADIOGRAPHIC STUDIES: No results found.   ASSESSMENT & PLAN [name] [name] 63 y.o. male with medical history significant for squamous cell carcinoma of left tonsil presents for a follow up visit.    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 Kathrin Greathouse, ENT at [facility] -Multiple discussions were held with radiation oncology and ENT at [facility].  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 Patient holding on that plan at this time.  --Patient was referred to [facility]NT for further evaluation. He saw Dr. Gordan [name] on 04/24/2022 who recommended segmental mandibulectomy with left scap tip free flap.  -- Scheduled for dental work next week to begin the process of having his jaw worked on.  #Hypothyroidisim, radiation induced --current on levothyroxine 25 mcg PO daily.  --TSH 12.819, Free T4 normal at last check .Will recheck today.  --Recommend to continue current dose of levothyroxine.  #Recurrent LLE DVT #Acute Right Upper Extremity  DVT/SVT --patient had a midline placed for abx, developed a subsequent RUE DVT --this is considered provoked in the setting of a catheter, though patient is prone to clotting. --previously on therapeutic 1mg /kg lovenox x 3 months since May 2022. HOLD coumadin. -- Recommended the patient not return to Coumadin therapy --continue Xarelto 20mg  PO daily.  --Labs today show white blood cell count 6.2, hemoglobin 16.4, MCV 95.7, platelets 160 --Patient requires indefinite anticoagulation   #Pain Control --Under the care of palliative care NP, Nikki --Regimen includes oxycodone 5mg  q6H PRN and Flexeril 10 mg TID PRN.   Follow up: --Labs and follow up in 6 months.  Offered Q 74-month follow-up with the patient would like to see [place] again in 6 months.  At that time to discuss yearly follow-up.  No orders of the defined types were placed in this encounter.   All questions were answered. The patient knows to call the clinic with any problems, questions or concerns.  I have spent a total of 30 minutes minutes of face-to-face and non-face-to-face time, preparing to see the patient, performing a medically appropriate examination, counseling and educating the patient, ordering meds, documenting clinical information in the electronic health record, and care coordination.   Ulysees Barns, MD Department of Hematology/Oncology [name] Phone: [phone] Pager: [phone] Email: [email or web address]

## 2023-09-16 ENCOUNTER — Encounter: Payer: Self-pay | Admitting: Hematology

## 2023-09-16 LAB — T4: T4, Total: 6.3 ug/dL (ref 4.5–12.0)

## 2023-10-06 ENCOUNTER — Other Ambulatory Visit: Payer: Self-pay

## 2023-10-06 DIAGNOSIS — G893 Neoplasm related pain (acute) (chronic): Secondary | ICD-10-CM

## 2023-10-06 DIAGNOSIS — L598 Other specified disorders of the skin and subcutaneous tissue related to radiation: Secondary | ICD-10-CM

## 2023-10-06 DIAGNOSIS — Z515 Encounter for palliative care: Secondary | ICD-10-CM

## 2023-10-06 MED ORDER — OXYCODONE HCL 5 MG PO TABS: 5.0000 mg | ORAL_TABLET | Freq: Four times a day (QID) | ORAL | 0 refills | Status: DC | PRN

## 2023-10-06 NOTE — Telephone Encounter (Signed)
 Pt called for medication refill, see associated orders

## 2023-11-05 ENCOUNTER — Other Ambulatory Visit: Payer: Self-pay

## 2023-11-05 ENCOUNTER — Other Ambulatory Visit: Payer: Self-pay | Admitting: Nurse Practitioner

## 2023-11-05 DIAGNOSIS — Z515 Encounter for palliative care: Secondary | ICD-10-CM

## 2023-11-05 DIAGNOSIS — G893 Neoplasm related pain (acute) (chronic): Secondary | ICD-10-CM

## 2023-11-05 DIAGNOSIS — L598 Other specified disorders of the skin and subcutaneous tissue related to radiation: Secondary | ICD-10-CM

## 2023-11-05 MED ORDER — OXYCODONE HCL 5 MG PO TABS
5.0000 mg | ORAL_TABLET | Freq: Four times a day (QID) | ORAL | 0 refills | Status: DC | PRN
Start: 2023-11-05 — End: 2023-12-06

## 2023-11-05 MED ORDER — OXYCODONE HCL 5 MG PO TABS: 5.0000 mg | ORAL_TABLET | Freq: Four times a day (QID) | ORAL | 0 refills | Status: DC | PRN

## 2023-11-05 NOTE — Telephone Encounter (Signed)
Pt called for medication refill

## 2023-11-07 ENCOUNTER — Other Ambulatory Visit: Payer: Self-pay | Admitting: Hematology and Oncology

## 2023-11-07 DIAGNOSIS — C090 Malignant neoplasm of tonsillar fossa: Secondary | ICD-10-CM

## 2023-11-10 ENCOUNTER — Inpatient Hospital Stay: Attending: Hematology and Oncology

## 2023-11-11 ENCOUNTER — Inpatient Hospital Stay: Attending: Hematology and Oncology | Admitting: Nurse Practitioner

## 2023-11-11 DIAGNOSIS — C090 Malignant neoplasm of tonsillar fossa: Secondary | ICD-10-CM | POA: Diagnosis not present

## 2023-11-11 DIAGNOSIS — R630 Anorexia: Secondary | ICD-10-CM | POA: Diagnosis not present

## 2023-11-11 DIAGNOSIS — R530 Neoplastic (malignant) related fatigue: Secondary | ICD-10-CM

## 2023-11-11 DIAGNOSIS — G893 Neoplasm related pain (acute) (chronic): Secondary | ICD-10-CM

## 2023-11-11 DIAGNOSIS — Z515 Encounter for palliative care: Secondary | ICD-10-CM | POA: Diagnosis not present

## 2023-11-11 NOTE — Progress Notes (Signed)
 Palliative Medicine [facility]  [phone] [phone]) [phone]   Name: [name] Date: 11/11/2023 MRN: [id number]  DOB: 10-24-1960  Patient Care Team: Dorothe Gaster, NP as PCP - General (Nurse Practitioner) Scotty Cyphers, MD as Consulting Physician (Oncology) Reynold Caves, MD as Consulting Physician (Otolaryngology) Colie Dawes, MD as Attending Physician (Radiation Oncology) Florina Husbands, MD (Inactive) as Consulting Physician (Hematology) Kristy Phenes, RN (Inactive) as Oncology Nurse Navigator Pickenpack-Cousar, Giles Labrum, NP as Nurse Practitioner (Nurse Practitioner)   I connected with Luretha Salmon on 11/11/23 at 11:30 AM EDT by telephone and verified that I am speaking with the correct person using two identifiers.   I discussed the limitations, risks, security and privacy concerns of performing an evaluation and management service by telemedicine and the availability of in-person appointments. I also discussed with the patient that there may be a patient responsible charge related to this service. The patient expressed understanding and agreed to proceed.   Other persons participating in the visit and their role in the encounter: N/A   Patient's location: Home   Provider's location: [facility]   INTERVAL HISTORY: [name] [name] is a 63 y.o. 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 Palliative ask to see for symptom management.    SOCIAL HISTORY:     reports that he quit smoking about 5 years ago. His smoking use included cigarettes. He started smoking about 35 years ago. He has a 30 pack-year smoking history. He has never been exposed to tobacco smoke. He has never used smokeless tobacco. He reports that he does not currently use alcohol. He reports that he does not currently use drugs after having used the following drugs: Marijuana.  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   Right SFA 05/07/17 while on warfarin INR 2.9   Atrial fibrillation (HCC)    Benign essential HTN 01/04/2012   Chronic anticoagulation 01/02/2013   Dermatofibroma of forearm 01/02/2013   Left side   DVT, lower extremity, proximal (HCC) 05/24/2013   Right leg femoral & popliteal 05/24/13   Hyperlipidemia, mixed 01/04/2012   Hypothyroidism    Pneumonia    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  MEDICATIONS:  Current Outpatient Medications  Medication Sig Dispense Refill   acetaminophen  (TYLENOL ) 325 MG tablet Take 2 tablets (650 mg total) by mouth every 6 (six) hours.     amoxicillin -clavulanate (AUGMENTIN ) 600-42.9 MG/5ML suspension Take 7.5 mLs by mouth 2 (two) times daily. 125 mL 0   aspirin  EC 81 MG EC tablet Take 1 tablet (81 mg total) by mouth daily. Swallow whole. 30 tablet 11   atorvastatin  (LIPITOR) 20 MG tablet Take 1 tablet (20 mg total) by mouth daily. 90 tablet 1   cyclobenzaprine  (FLEXERIL ) 10 MG tablet Take 1 tablet (10 mg total) by mouth 3 (three) times daily as needed for  muscle spasms. 60 tablet 2   gabapentin  (NEURONTIN ) 100 MG capsule Take by mouth.     ibuprofen (ADVIL) 200 MG tablet Take 200 mg by mouth every 6 (six) hours as needed.     levothyroxine  (SYNTHROID ) 25 MCG tablet TAKE 1 TABLET (25 MCG TOTAL) BY MOUTH DAILY BEFORE BREAKFAST. TAKE ON EMPTY STOMACH W/  WATER, 1 HOUR BEFORE OTHER MEDS OR FOOD. 90 tablet 0   metoprolol  succinate (TOPROL -XL) 25 MG 24 hr tablet Take 1 tablet (25 mg total) by mouth daily. 90 tablet 1   oxyCODONE  (OXY IR/ROXICODONE ) 5 MG immediate release tablet Take 1 tablet (5 mg total) by mouth every 6 (six) hours as needed for severe pain (pain score 7-10). 90 tablet 0   pantoprazole  (PROTONIX ) 40 MG tablet Take 1 tablet (40 mg total) by mouth 2 (two) times daily. 60 tablet 1   polyethylene glycol (MIRALAX  / GLYCOLAX ) 17 g packet Take 17 g by mouth 2 (two) times daily. 14 each 0   rivaroxaban  (XARELTO ) 20 MG TABS tablet Take 1 tablet (20 mg total) by mouth daily with supper. 90 tablet 2   No current facility-administered medications for this visit.    VITAL SIGNS: There were no vitals taken for this visit. There were no vitals filed for this visit.  Estimated body mass index is 29.1 kg/m as calculated from the following:   Height as of 08/09/23: 6\' 3"  (1.905 m).   Weight as of 09/15/23: 232 lb 12.8 oz (105.6 kg).   PERFORMANCE STATUS (ECOG) : 1 - Symptomatic but completely ambulatory  IMPRESSION:  I connected with Mr. Flax by phone for follow-up. No acute distress. Denies nausea, vomiting, constipation, or diarrhea. Appetite is fair. Some days better than others considering his overall condition. Continues to consume mostly soft diet and liquids. Some increase in pains as he recently had several teeth extraction under the care of [facility]. Tolerated well. Reports follow-up appointment on May 1st for further extractions and surgical interventions.   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 He is currently taking oxycodone  5 mg every 6 hours as needed for pain, Advil in between, and flexeril  as needed for spasms. Does not require oxycodone  daily. We will continue current regimen with close follow-up.  Taking as directed. Refills appropriate with prescription lasting greater than 45 days  indicating patient is taking responsibly and as needed only.   We will continue to closely monitor and support as needed.   PLAN: Maintain close follow-up with [facility] s/p extractions Oxycodone  as needed for pain. Not requiring daily Flexeril  as needed for muscle spasms We will continue to closely monitor and assist with symptom management. I will plan to see patient back in 6-8 weeks.  Patient expressed understanding and was in agreement with this plan. He also understands that He can call the clinic at any time with any questions, concerns, or complaints.      Any controlled substances utilized were prescribed in the context of palliative care. PDMP has been reviewed.   Visit consisted of counseling and education dealing with the complex and emotionally intense issues of symptom management and palliative care in the setting of serious and potentially life-threatening illness.  Dellia Ferguson, AGPCNP-BC  Palliative Medicine Team/[facility]

## 2023-11-15 ENCOUNTER — Ambulatory Visit (INDEPENDENT_AMBULATORY_CARE_PROVIDER_SITE_OTHER): Payer: Medicaid Other | Admitting: Nurse Practitioner

## 2023-11-15 ENCOUNTER — Encounter: Payer: Self-pay | Admitting: Nurse Practitioner

## 2023-11-15 VITALS — BP 118/82 | HR 70 | Temp 97.6°F | Ht 75.5 in | Wt 234.2 lb

## 2023-11-15 DIAGNOSIS — Z0000 Encounter for general adult medical examination without abnormal findings: Secondary | ICD-10-CM | POA: Diagnosis not present

## 2023-11-15 DIAGNOSIS — I700 Atherosclerosis of aorta: Secondary | ICD-10-CM | POA: Diagnosis not present

## 2023-11-15 DIAGNOSIS — E785 Hyperlipidemia, unspecified: Secondary | ICD-10-CM

## 2023-11-15 DIAGNOSIS — E039 Hypothyroidism, unspecified: Secondary | ICD-10-CM | POA: Diagnosis not present

## 2023-11-15 DIAGNOSIS — C090 Malignant neoplasm of tonsillar fossa: Secondary | ICD-10-CM

## 2023-11-15 DIAGNOSIS — Z131 Encounter for screening for diabetes mellitus: Secondary | ICD-10-CM

## 2023-11-15 DIAGNOSIS — I10 Essential (primary) hypertension: Secondary | ICD-10-CM | POA: Diagnosis not present

## 2023-11-15 DIAGNOSIS — Z7901 Long term (current) use of anticoagulants: Secondary | ICD-10-CM | POA: Diagnosis not present

## 2023-11-15 DIAGNOSIS — I739 Peripheral vascular disease, unspecified: Secondary | ICD-10-CM | POA: Diagnosis not present

## 2023-11-15 DIAGNOSIS — Z122 Encounter for screening for malignant neoplasm of respiratory organs: Secondary | ICD-10-CM

## 2023-11-15 DIAGNOSIS — Z1211 Encounter for screening for malignant neoplasm of colon: Secondary | ICD-10-CM

## 2023-11-15 DIAGNOSIS — Z7189 Other specified counseling: Secondary | ICD-10-CM

## 2023-11-15 DIAGNOSIS — Z87891 Personal history of nicotine dependence: Secondary | ICD-10-CM | POA: Diagnosis not present

## 2023-11-15 DIAGNOSIS — I480 Paroxysmal atrial fibrillation: Secondary | ICD-10-CM | POA: Diagnosis not present

## 2023-11-15 DIAGNOSIS — I82409 Acute embolism and thrombosis of unspecified deep veins of unspecified lower extremity: Secondary | ICD-10-CM

## 2023-11-15 DIAGNOSIS — I7121 Aneurysm of the ascending aorta, without rupture: Secondary | ICD-10-CM

## 2023-11-15 MED ORDER — ATORVASTATIN CALCIUM 20 MG PO TABS: 20.0000 mg | ORAL_TABLET | Freq: Every day | ORAL | 3 refills | Status: AC

## 2023-11-15 MED ORDER — LEVOTHYROXINE SODIUM 25 MCG PO TABS
25.0000 ug | ORAL_TABLET | Freq: Every day | ORAL | 0 refills | Status: DC
Start: 2023-11-15 — End: 2023-11-17

## 2023-11-15 MED ORDER — METOPROLOL SUCCINATE ER 25 MG PO TB24: 25.0000 mg | ORAL_TABLET | Freq: Every day | ORAL | 3 refills | Status: AC

## 2023-11-15 NOTE — Assessment & Plan Note (Signed)
 History of the same with the setting of PAD.  Patient is currently maintained on atorvastatin .  Refill provided today pending lipid panel

## 2023-11-15 NOTE — Assessment & Plan Note (Signed)
 History of the same.  Patient currently maintained on Xarelto  20 mg daily.  Will need lifelong therapy

## 2023-11-15 NOTE — Patient Instructions (Signed)
Nice to see you today I will be in touch with the labs once I have them Follow up with me in 1 year, sooner If you need me  

## 2023-11-15 NOTE — Assessment & Plan Note (Signed)
 History of the same pending urine microscopy rule out microscopic hematuria

## 2023-11-15 NOTE — Assessment & Plan Note (Signed)
 History of same.  Patient currently maintained on metoprolol  25 mg daily.  Blood pressure well-controlled in office today continue medication as prescribed

## 2023-11-15 NOTE — Assessment & Plan Note (Signed)
 History of same.  Patient currently maintained on metoprolol  25 mg daily and Xarelto  20 mg daily.  Not followed by cardiology patient is asymptomatic continue medications as prescribed

## 2023-11-15 NOTE — Progress Notes (Signed)
 Established Patient Office Visit  Subjective   Patient ID: [name] [name], male    DOB: 1960/12/06  Age: 63 y.o. MRN: [id number]  Chief Complaint  Patient presents with   Annual Exam    HPI  HTN: patient currently on metoprolol  25mg . Does not check Bp on a regular basis, tolerates medication well   PAD: followed by vascular Dr. Delon Ferris every 6 months, On anti lipid and anticoagulation   T AAA: followed by Dr. Delon Ferris. He is s/p repair  DVT: hx of recurrent ones and on xarelto   AFIb: not currently followed by cardiology. He is on anticoagulation and metoprolol . Rare palpitations per his report  HLD: currenty maintained on atorvastatin   Cancer: hx of the same with chemo and radiation. He is followed Oral sugery and onlcology.   Hypothyroidism: s/p treatment. He is on levothyroxine  25mcg daily   for complete physical and follow up of chronic conditions.  Immunizations: -Tetanus: Completed in 2023 -Influenza: out of season  -Shingles: refused  -Pneumonia: Completed 08/14/2021  Diet: Fair diet. He is eating liquids currenlty. States that he will do half a gallono of milk with protient supplements  Exercise: No regular exercise. He does try to walk   Eye exam: Completes annually. Glasses. He is overdue  Dental exam: oral surgery with [facility]    Colonoscopy: cologuard ordered today  Lung Cancer Screening: needs referral    PSA: UTD  Sleep: goes to bed around late and will get up at varyig times and 8-10. He will take naps through the day. He does not feel rested.     Review of Systems  Constitutional:  Negative for chills and fever.  Respiratory:  Negative for shortness of breath.   Cardiovascular:  Positive for palpitations. Negative for chest pain and leg swelling.  Gastrointestinal:  Negative for abdominal pain, blood in stool, constipation, diarrhea, nausea and vomiting.  Genitourinary:  Negative for dysuria and hematuria.  Neurological:  Negative for tingling and  headaches.  Psychiatric/Behavioral:  Negative for hallucinations and suicidal ideas.       Objective:     BP 118/82   Pulse 70   Temp 97.6 F (36.4 C) (Oral)   Ht 6' 3.5" (1.918 m)   Wt 234 lb 3.2 oz (106.2 kg)   SpO2 95%   BMI 28.89 kg/m  BP Readings from Last 3 Encounters:  11/15/23 118/82  09/15/23 (!) 126/91  08/09/23 110/80   Wt Readings from Last 3 Encounters:  11/15/23 234 lb 3.2 oz (106.2 kg)  09/15/23 232 lb 12.8 oz (105.6 kg)  08/09/23 234 lb (106.1 kg)   SpO2 Readings from Last 3 Encounters:  11/15/23 95%  09/15/23 96%  08/09/23 98%      Physical Exam Vitals and nursing note reviewed.  Constitutional:      Appearance: Normal appearance.  HENT:     Right Ear: Tympanic membrane, ear canal and external ear normal.     Left Ear: Tympanic membrane, ear canal and external ear normal.     Mouth/Throat:     Mouth: Mucous membranes are moist.     Pharynx: Oropharynx is clear.  Eyes:     Extraocular Movements: Extraocular movements intact.     Pupils: Pupils are equal, round, and reactive to light.  Cardiovascular:     Rate and Rhythm: Normal rate and regular rhythm.     Pulses: Normal pulses.     Heart sounds: Normal heart sounds.  Pulmonary:     Effort: Pulmonary effort  is normal.     Breath sounds: Normal breath sounds.  Abdominal:     General: Bowel sounds are normal. There is no distension.     Palpations: There is no mass.     Tenderness: There is no abdominal tenderness.     Hernia: A hernia is present.  Musculoskeletal:     Cervical back: Tenderness present.     Right lower leg: No edema.     Left lower leg: No edema.  Lymphadenopathy:     Cervical: No cervical adenopathy.  Skin:    General: Skin is warm.  Neurological:     General: No focal deficit present.     Mental Status: He is alert.     Deep Tendon Reflexes:     Reflex Scores:      Bicep reflexes are 2+ on the right side and 2+ on the left side.      Patellar reflexes are 2+ on  the right side and 2+ on the left side.    Comments: Bilateral upper and lower extremity strength 5/5  Psychiatric:        Mood and Affect: Mood normal.        Behavior: Behavior normal.        Thought Content: Thought content normal.        Judgment: Judgment normal.      No results found for any visits on 11/15/23.    The 10-year ASCVD risk score (Arnett DK, et al., 2019) is: 9.4%    Assessment & Plan:   Problem List Items Addressed This Visit       Cardiovascular and Mediastinum   Benign essential HTN - Primary (Chronic)   History of same.  Patient currently maintained on metoprolol  25 mg daily.  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  Patient currently maintained on Xarelto  20 mg daily.  Will need lifelong therapy      Relevant Medications   metoprolol  succinate (TOPROL -XL) 25 MG 24 hr tablet   atorvastatin  (LIPITOR) 20 MG tablet   Thoracic ascending aortic aneurysm (HCC) (Chronic)   Patient is currently followed by vascular continue following with specialist as recommended.      Relevant Medications   metoprolol  succinate (TOPROL -XL) 25 MG 24 hr tablet   atorvastatin  (LIPITOR) 20 MG tablet   PAD (peripheral artery disease) (HCC) (Chronic)   History of arterial bypasses to right lower extremity.  He is followed by vascular yearly.  Patient is on atorvastatin  and aspirin  along with Xarelto .  Continue following with specialist as recommended continue medications as prescribed      Relevant Medications   metoprolol  succinate (TOPROL -XL) 25 MG 24 hr tablet   atorvastatin  (LIPITOR) 20 MG tablet   Aortic atherosclerosis (HCC)   History of the same with  the setting of PAD.  Patient is currently maintained on atorvastatin .  Refill provided today pending lipid panel      Relevant Medications   metoprolol  succinate (TOPROL -XL) 25 MG 24 hr tablet   atorvastatin  (LIPITOR) 20 MG tablet   Other Relevant Orders   Lipid panel   Atrial fibrillation (HCC)   History of same.  Patient currently maintained on metoprolol  25 mg daily and Xarelto  20 mg daily.  Not followed by cardiology patient is asymptomatic continue medications as prescribed      Relevant Medications   metoprolol  succinate (TOPROL -XL) 25 MG 24 hr tablet   atorvastatin  (LIPITOR) 20 MG tablet     Respiratory   Malignant neoplasm of tonsillar fossa [facility])   Patient currently followed by oncology and [facility] oral surgery.  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  Patient currently maintained on levothyroxine  25 mcg daily.  Refill provided today pending TSH      Relevant Medications   levothyroxine  (SYNTHROID ) 25 MCG tablet   metoprolol  succinate (TOPROL -XL) 25 MG 24 hr tablet   Other Relevant Orders   TSH     Other   Hyperlipidemia   History of the same.  Patient currently maintained on atorvastatin  20 mg daily continue pending lipid panel today      Relevant Medications   metoprolol  succinate (TOPROL -XL) 25 MG 24 hr tablet   atorvastatin  (LIPITOR) 20 MG tablet   Chronic anticoagulation   On Xarelto  20 mg daily for atrial fibrillation and recurrent DVTs.  Continue anticoagulation as prescribed pending CBC today      Preventative health care   Discussed age-appropriate immunizations and screening exams.  Did review patient's personal, surgical, social, family histories.  Patient is up-to-date on all age-appropriate vaccinations he would like.  We did discuss the shingles vaccine in office.  Patient  politely declined.  We ordered Cologuard today for CRC screening.  PSA is up-to-date for prostate cancer screening.  Patient was given information at discharge about preventative healthcare maintenance with anticipatory guidance      Former smoker   History of the same pending urine microscopy rule out microscopic hematuria      Relevant Orders   Urine Microscopic   Other Visit Diagnoses       Encounter for cardiac risk counseling       Relevant Medications   metoprolol  succinate (TOPROL -XL) 25 MG 24 hr tablet     Screening for lung cancer       Relevant Orders   Ambulatory Referral Lung Cancer Screening [facility]     Screening for diabetes mellitus       Relevant Orders   Hemoglobin A1c     Screening for colon cancer       Relevant Orders   Cologuard       Return in about 1 year (around 11/14/2024) for CPE and Labs.    Margarie Shay, NP

## 2023-11-15 NOTE — Assessment & Plan Note (Signed)
 Discussed age-appropriate immunizations and screening exams.  Did review patient's personal, surgical, social, family histories.  Patient is up-to-date on all age-appropriate vaccinations he would like.  We did discuss the shingles vaccine in office.  Patient politely declined.  We ordered Cologuard today for CRC screening.  PSA is up-to-date for prostate cancer screening.  Patient was given information at discharge about preventative healthcare maintenance with anticipatory guidance

## 2023-11-15 NOTE — Assessment & Plan Note (Signed)
 Secondary to radiation due to tonsillar cancer.  Patient currently maintained on levothyroxine  25 mcg daily.  Refill provided today pending TSH

## 2023-11-15 NOTE — Assessment & Plan Note (Signed)
 Patient currently followed by oncology and [facility] oral surgery.  Continue following with specialist as recommended

## 2023-11-15 NOTE — Assessment & Plan Note (Signed)
 History of the same.  Patient currently maintained on atorvastatin  20 mg daily continue pending lipid panel today

## 2023-11-15 NOTE — Assessment & Plan Note (Signed)
 On Xarelto  20 mg daily for atrial fibrillation and recurrent DVTs.  Continue anticoagulation as prescribed pending CBC today

## 2023-11-15 NOTE — Assessment & Plan Note (Signed)
 Patient is currently followed by vascular continue following with specialist as recommended.

## 2023-11-15 NOTE — Assessment & Plan Note (Signed)
 History of arterial bypasses to right lower extremity.  He is followed by vascular yearly.  Patient is on atorvastatin  and aspirin  along with Xarelto .  Continue following with specialist as recommended continue medications as prescribed

## 2023-11-16 ENCOUNTER — Encounter: Payer: Self-pay | Admitting: Hematology

## 2023-11-16 LAB — URINALYSIS, MICROSCOPIC ONLY

## 2023-11-16 LAB — CBC
HCT: 49.3 % (ref 39.0–52.0)
Hemoglobin: 16.7 g/dL (ref 13.0–17.0)
MCHC: 33.8 g/dL (ref 30.0–36.0)
MCV: 98.9 fl (ref 78.0–100.0)
Platelets: 220 10*3/uL (ref 150.0–400.0)
RBC: 4.99 Mil/uL (ref 4.22–5.81)
RDW: 15 % (ref 11.5–15.5)
WBC: 7.9 10*3/uL (ref 4.0–10.5)

## 2023-11-16 LAB — LIPID PANEL
Cholesterol: 130 mg/dL (ref 0–200)
HDL: 42.9 mg/dL (ref >39.00)
LDL Cholesterol: 26 mg/dL (ref 0–99)
NonHDL: 87.56
Total CHOL/HDL Ratio: 3
Triglycerides: 306 mg/dL — ABNORMAL HIGH (ref 0.0–149.0)
VLDL: 61.2 mg/dL — ABNORMAL HIGH (ref 0.0–40.0)

## 2023-11-16 LAB — COMPREHENSIVE METABOLIC PANEL WITH GFR
ALT: 19 U/L (ref 0–53)
AST: 24 U/L (ref 0–37)
Albumin: 4.3 g/dL (ref 3.5–5.2)
Alkaline Phosphatase: 89 U/L (ref 39–117)
BUN: 10 mg/dL (ref 6–23)
CO2: 30 meq/L (ref 19–32)
Calcium: 9.9 mg/dL (ref 8.4–10.5)
Chloride: 100 meq/L (ref 96–112)
Creatinine, Ser: 1.01 mg/dL (ref 0.40–1.50)
GFR: 78.97 mL/min (ref >60.00)
Glucose, Bld: 91 mg/dL (ref 70–99)
Potassium: 4.6 meq/L (ref 3.5–5.1)
Sodium: 139 meq/L (ref 135–145)
Total Bilirubin: 0.6 mg/dL (ref 0.2–1.2)
Total Protein: 6.9 g/dL (ref 6.0–8.3)

## 2023-11-16 LAB — TSH: TSH: 9.2 u[IU]/mL — ABNORMAL HIGH (ref 0.35–5.50)

## 2023-11-16 LAB — HEMOGLOBIN A1C: Hgb A1c MFr Bld: 5.3 % (ref 4.6–6.5)

## 2023-11-17 ENCOUNTER — Encounter: Payer: Self-pay | Admitting: Nurse Practitioner

## 2023-11-17 ENCOUNTER — Other Ambulatory Visit: Payer: Self-pay | Admitting: Nurse Practitioner

## 2023-11-17 DIAGNOSIS — E039 Hypothyroidism, unspecified: Secondary | ICD-10-CM

## 2023-11-17 MED ORDER — LEVOTHYROXINE SODIUM 50 MCG PO TABS: 50.0000 ug | ORAL_TABLET | Freq: Every day | ORAL | 0 refills | Status: DC

## 2023-11-22 ENCOUNTER — Other Ambulatory Visit: Payer: Self-pay | Admitting: Nurse Practitioner

## 2023-11-22 ENCOUNTER — Ambulatory Visit: Payer: Self-pay

## 2023-11-22 ENCOUNTER — Telehealth: Payer: Self-pay

## 2023-11-22 DIAGNOSIS — Z7189 Other specified counseling: Secondary | ICD-10-CM

## 2023-11-22 NOTE — Telephone Encounter (Signed)
 Antibiotic will not help with swelling unless it is dealing with an infection. We can see him in office to evaluate if he would like

## 2023-11-22 NOTE — Telephone Encounter (Signed)
 Copied from CRM [id number]. Topic: Clinical - Medication Question >> Nov 22, 2023 10:23 AM Carlatta H wrote: Reason for CRM: Please call patient regarding antibiotic for dental surgery that was recently had//He would like prescription called in//Please call to advise:[id number] [place] - [address] [place] [address] Phone: [phone] Fax: [phone] Hours: Not open 24 hours

## 2023-11-22 NOTE — Telephone Encounter (Signed)
 Contacted Oral Surgeon Dr. Daphene Dys. Unable to reach office or leave a voicemail.

## 2023-11-22 NOTE — Telephone Encounter (Signed)
 Contacted pt.  Pt declined OV and states he will just go to urgent care.  No further questions or concerns.

## 2023-11-22 NOTE — Telephone Encounter (Signed)
 Pt complains that he cannot get in touch with oral surgeons office.  Pt states he has been calling all day.  Asked pt if he thinks he has an infection. Pt stated no but is swollen and painful.  Relayed lab results with patient and scheduled lab visit for 01/10/24 @3 :45PM.

## 2023-11-22 NOTE — Telephone Encounter (Signed)
 Called patient reviewed all information and repeated back to me.

## 2023-11-22 NOTE — Telephone Encounter (Signed)
 Copied from CRM [id number]. Topic: Clinical - Medication Refill >> Nov 22, 2023 10:26 AM Felecia Hopper H wrote: Most Recent Primary Care Visit:  Provider: Dorothe Gaster  Department: [facility]  Visit Type: PHYSICAL  Date: 11/15/2023  Medication: metoprolol  succinate (TOPROL -XL) 25 MG 24 hr tablet [[id number]]  Has the patient contacted their pharmacy? No (Agent: If no, request that the patient contact the pharmacy for the refill. If patient does not wish to contact the pharmacy document the reason why and proceed with request.) (Agent: If yes, when and what did the pharmacy advise?)  Is this the correct pharmacy for this prescription? Yes If no, delete pharmacy and type the correct one.  This is the patient's preferred pharmacy:  [name] [place] - [address] [place] [address] Phone: [phone] Fax: [phone]       Has the prescription been filled recently? No  Is the patient out of the medication? Yes  Has the patient been seen for an appointment in the last year OR does the patient have an upcoming appointment? Yes  Can we respond through MyChart? No  Agent: Please be advised that Rx refills may take up to 3 business days. We ask that you follow-up with your pharmacy.

## 2023-11-22 NOTE — Telephone Encounter (Signed)
 Chief Complaint: toothache Symptoms: toothache,dental pain, swelling Frequency: since Thursday afternoon when he had teeth pulled Pertinent Negatives: Patient denies difficulty breathing Disposition: [] ED /[] Urgent Care (no appt availability in office) / [x] Appointment(In office/virtual)/ []  [place] [facility]/ [] Home Care/ [x] Refused Recommended Disposition /[] [facility]/ []  Follow-up with PCP Additional Notes: Pt had requested a refill of metoprolol  and amoxicillin  today. RN called the pt to advise him that he has refills of metoprolol  at the pharmacy. Pt reports lower R sided dental and jaw pain after having 8 teeth removed from that same side at [facility] Thursday afternoon. Pt states he was not prescribed an antibiotic before or after the removal of the teeth. Pt states he has tried to contact his dentist but has been unable to get a hold of anyone. Pt states his lower R jaw is swollen as well. Pt states he has had difficulty swallowing "for a while" but denies difficulty breathing. RN asked pt to rate pain. Pt responded "I'm not hurting bad enough to come into the office." Pt states he was prescribed amoxicillin  last summer and would like to take it again. RN advised pt he should be seen by a HCP but pt declined. RN advised pt RN would relay the concern to the office for follow-up but that the pt should be seen in the Ed or at UC if he worsens. Pt verbalized understanding.    Reason for Disposition  [1] Face is swollen AND [2] no fever  Answer Assessment - Initial Assessment Questions 1. LOCATION: "Which tooth is hurting?"  (e.g., right-side/left-side, upper/lower, front/back)     Bottom R - pt states he is having pain where the teeth were removed 2. ONSET: "When did the toothache start?"  (e.g., hours, days)      Friday 3. SEVERITY: "How bad is the toothache?"  (Scale 1-10; mild, moderate or severe)   - MILD (1-3): doesn't interfere with chewing    - MODERATE (4-7): interferes  with chewing, interferes with normal activities, awakens from sleep     - SEVERE (8-10): unable to eat, unable to do any normal activities, excruciating pain        "I'm not hurting bad enough to come into the office" 4. SWELLING: "Is there any visible swelling of your face?"     Lower R jaw is swollen 5. OTHER SYMPTOMS: "Do you have any other symptoms?" (e.g., fever)     Pt states he had multiple (8) teeth pulled Thursday afternoon and his dentist did not give him an antibiotic [facility]). Pt is requesting an antibiotic. Swelling to the lower R jaw. Pt states she had 3 amoxicillin  pills prescribed last summer, states he took those and needs more. Problems "swallowing for a while"  Protocols used: Toothache-A-AH

## 2023-11-22 NOTE — Telephone Encounter (Signed)
 He can be seen if he feels like he has an infection or contact the oral surgeon for further advice  His metoprolol  was refilled on 11/15/2023 to [facility] for a year

## 2023-11-22 NOTE — Telephone Encounter (Signed)
 See other note that was responded too

## 2023-11-22 NOTE — Telephone Encounter (Signed)
 RN called pt and advised him he has refills of metoprolol  at the pharmacy. Pt states he has already picked it up.

## 2023-11-30 ENCOUNTER — Encounter (HOSPITAL_COMMUNITY): Payer: Medicaid Other

## 2023-11-30 ENCOUNTER — Ambulatory Visit: Payer: Medicaid Other

## 2023-12-02 ENCOUNTER — Telehealth: Payer: Self-pay

## 2023-12-02 NOTE — Telephone Encounter (Signed)
 Scheduled patient's appts. Advised patient to contact us if rescheduling is needed. Provided my direct line.

## 2023-12-06 ENCOUNTER — Other Ambulatory Visit: Payer: Self-pay | Admitting: Nurse Practitioner

## 2023-12-06 DIAGNOSIS — G893 Neoplasm related pain (acute) (chronic): Secondary | ICD-10-CM

## 2023-12-06 DIAGNOSIS — L598 Other specified disorders of the skin and subcutaneous tissue related to radiation: Secondary | ICD-10-CM

## 2023-12-06 DIAGNOSIS — Z515 Encounter for palliative care: Secondary | ICD-10-CM

## 2023-12-06 MED ORDER — OXYCODONE HCL 5 MG PO TABS
5.0000 mg | ORAL_TABLET | Freq: Four times a day (QID) | ORAL | 0 refills | Status: DC | PRN
Start: 2023-12-06 — End: 2024-01-06

## 2023-12-09 ENCOUNTER — Telehealth

## 2023-12-14 ENCOUNTER — Encounter: Payer: Self-pay | Admitting: Emergency Medicine

## 2023-12-23 ENCOUNTER — Telehealth

## 2023-12-27 ENCOUNTER — Encounter: Payer: Self-pay | Admitting: Nurse Practitioner

## 2023-12-27 ENCOUNTER — Inpatient Hospital Stay: Attending: Hematology and Oncology | Admitting: Nurse Practitioner

## 2023-12-27 DIAGNOSIS — C090 Malignant neoplasm of tonsillar fossa: Secondary | ICD-10-CM | POA: Diagnosis not present

## 2023-12-27 DIAGNOSIS — G893 Neoplasm related pain (acute) (chronic): Secondary | ICD-10-CM | POA: Diagnosis not present

## 2023-12-27 DIAGNOSIS — Z515 Encounter for palliative care: Secondary | ICD-10-CM | POA: Diagnosis not present

## 2023-12-27 DIAGNOSIS — R530 Neoplastic (malignant) related fatigue: Secondary | ICD-10-CM | POA: Diagnosis not present

## 2023-12-27 NOTE — Progress Notes (Signed)
 Palliative Medicine [facility]  [phone] [phone]   Name: [name] [name] Date: 12/27/2023 MRN: [id number]  DOB: Sep 16, 1960  Patient Care Team: Dorothe Gaster, NP as PCP - General (Nurse Practitioner) Scotty Cyphers, MD as Consulting Physician (Oncology) Reynold Caves, MD as Consulting Physician (Otolaryngology) Colie Dawes, MD as Attending Physician (Radiation Oncology) Florina Husbands, MD (Inactive) as Consulting Physician (Hematology) Kristy Phenes, RN (Inactive) as Oncology Nurse Navigator Pickenpack-Cousar, Giles Labrum, NP as Nurse Practitioner (Nurse Practitioner)   I connected with [name] [name] on 12/27/23 at  4:00 PM EDT by telephone and verified that I am speaking with the correct person using two identifiers.   I discussed the limitations, risks, security and privacy concerns of performing an evaluation and management service by telemedicine and the availability of in-person appointments. I also discussed with the patient that there may be a patient responsible charge related to this service. The patient expressed understanding and agreed to proceed.   Other persons participating in the visit and their role in the encounter: N/A   Patient's location: Home   Provider's location: [facility]   INTERVAL HISTORY: [name] [name] is a 63 y.o. 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 Palliative ask to see for symptom management.    SOCIAL HISTORY:     reports that he quit smoking about 5 years ago. His smoking use included cigarettes. He started smoking about 35 years ago. He has a 30 pack-year smoking history. He has never been exposed to tobacco smoke. He has never used smokeless tobacco. He reports that he does not currently use alcohol. He reports that he does not currently use drugs after having used the following drugs: Marijuana.  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   Right SFA 05/07/17 while on warfarin INR 2.9   Atrial fibrillation (HCC)    Benign essential HTN 01/04/2012   Chronic anticoagulation 01/02/2013   Dermatofibroma of forearm 01/02/2013   Left side   DVT, lower extremity, proximal (HCC) 05/24/2013   Right leg femoral & popliteal 05/24/13   Hyperlipidemia, mixed 01/04/2012   Hypothyroidism    Pneumonia    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  MEDICATIONS:  Current Outpatient Medications  Medication Sig Dispense Refill   acetaminophen  (TYLENOL ) 325 MG tablet Take 2 tablets (650 mg total) by mouth every 6 (six) hours.     amoxicillin -clavulanate (AUGMENTIN ) 600-42.9 MG/5ML suspension Take 7.5 mLs by mouth 2 (two) times daily. 125 mL 0   aspirin  EC 81 MG EC tablet Take 1 tablet (81 mg total) by mouth daily. Swallow whole. 30 tablet 11   atorvastatin  (LIPITOR) 20 MG tablet Take 1 tablet (20 mg total) by mouth daily. 90 tablet 3   cyclobenzaprine  (FLEXERIL ) 10 MG tablet Take 1 tablet (10 mg total) by mouth 3 (three) times daily as needed  for muscle spasms. 60 tablet 2   gabapentin  (NEURONTIN ) 100 MG capsule Take by mouth.     ibuprofen (ADVIL) 200 MG tablet Take 200 mg by mouth every 6 (six) hours as needed.     levothyroxine  (SYNTHROID ) 50 MCG tablet Take 1 tablet (50 mcg total) by mouth daily. 90 tablet 0   metoprolol  succinate  (TOPROL -XL) 25 MG 24 hr tablet Take 1 tablet (25 mg total) by mouth daily. 90 tablet 3   oxyCODONE  (OXY IR/ROXICODONE ) 5 MG immediate release tablet Take 1 tablet (5 mg total) by mouth every 6 (six) hours as needed for severe pain (pain score 7-10). 90 tablet 0   pantoprazole  (PROTONIX ) 40 MG tablet Take 1 tablet (40 mg total) by mouth 2 (two) times daily. 60 tablet 1   polyethylene glycol (MIRALAX  / GLYCOLAX ) 17 g packet Take 17 g by mouth 2 (two) times daily. 14 each 0   rivaroxaban  (XARELTO ) 20 MG TABS tablet Take 1 tablet (20 mg total) by mouth daily with supper. 90 tablet 2   No current facility-administered medications for this visit.    VITAL SIGNS: There were no vitals taken for this visit. There were no vitals filed for this visit.  Estimated body mass index is 28.89 kg/m as calculated from the following:   Height as of 11/15/23: 6' 3.5" (1.918 m).   Weight as of 11/15/23: 234 lb 3.2 oz (106.2 kg).   PERFORMANCE STATUS (ECOG) : 1 - Symptomatic but completely ambulatory  IMPRESSION:  I connected with [name] [name] by phone for follow-up. No acute distress. Denies nausea, vomiting, constipation, or diarrhea. Appetite is minimal as he continue to undergo teeth extractions and oral interventions via [facility]. Patient reports appointments in the next week for additional extractions. fair. Continues to consume mostly soft diet and liquids.   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 He is currently taking oxycodone  5 mg every 6 hours as needed for pain, Advil in between, and flexeril  as needed for spasms. Does not require oxycodone  daily. We will continue current regimen with close follow-up.  Taking as directed. Refills appropriate with prescription lasting greater than 45 days indicating patient is taking responsibly and as needed only.   We will continue to closely monitor and support as needed.   PLAN: Maintain close follow-up with [facility] s/p  extractions Oxycodone  as needed for pain. Not requiring daily Flexeril  as needed for muscle spasms We will continue to closely monitor and assist with symptom management. I will plan to see patient back in 6-8 weeks.  Patient expressed understanding and was in agreement with this plan. He also understands that He can call the clinic at any time with any questions, concerns, or complaints.      Any controlled substances utilized were prescribed in the context of palliative care. PDMP has been reviewed.   I provided 25 minutes of non face-to-face telephone visit time during this encounter, and > 50% was spent counseling as documented under my assessment & plan. Visit consisted of counseling and education dealing with the complex and emotionally intense issues of symptom management and palliative care in the setting of serious and potentially life-threatening illness.  Dellia Ferguson, AGPCNP-BC  Palliative Medicine Team/[facility]

## 2023-12-28 ENCOUNTER — Ambulatory Visit (HOSPITAL_COMMUNITY)
Admission: RE | Admit: 2023-12-28 | Discharge: 2023-12-28 | Disposition: A | Discharge status: Home / Self Care | Source: Ambulatory Visit | Attending: Vascular Surgery | Admitting: Vascular Surgery

## 2023-12-28 ENCOUNTER — Ambulatory Visit: Admitting: Physician Assistant

## 2023-12-28 VITALS — BP 123/86 | HR 73 | Temp 98.2°F | Ht 75.5 in | Wt 230.0 lb

## 2023-12-28 DIAGNOSIS — I739 Peripheral vascular disease, unspecified: Secondary | ICD-10-CM

## 2023-12-28 DIAGNOSIS — Z95828 Presence of other vascular implants and grafts: Secondary | ICD-10-CM | POA: Diagnosis not present

## 2023-12-28 LAB — VAS US ABI WITH/WO TBI
Left ABI: 1.2
Right ABI: 0.55

## 2023-12-28 NOTE — Progress Notes (Signed)
 Office Note     CC:  follow up Requesting Provider:  Dorothe Gaster, NP  HPI: [name] [name] is a 63 y.o. (May 19, 1961) male who presents for surveillance follow up of PAD. 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 This was performed by Dr. Edgardo Goodwill.               At his last follow up in November he was doing well. He was not having any claudication, rest pain or tissue loss.   Today he presents with his wife. He complains of numbness and tingling in his legs but this is not new. He is on Neurontin  for this. He is not having any pain on ambulation in his legs or at rest. No tissue loss. He denies any back pain or abdominal pain. He does have large ventral hernia which he says he wants repaired. 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 He recently had some of his teeth extracted but is anticipating further surgical intervention for this. He is managed on Xarelto  and is followed by a Hematologist for history of recurrent DVT and history of Afib.  He is medically managed on Statin daily as well. He was taking Aspirin  but says he stopped it.  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   Right SFA 05/07/17 while on warfarin INR 2.9   Atrial fibrillation (HCC)    Benign essential HTN 01/04/2012   Chronic anticoagulation 01/02/2013   Dermatofibroma of forearm 01/02/2013   Left side   DVT, lower extremity, proximal (HCC)  05/24/2013   Right leg femoral & popliteal 05/24/13   Hyperlipidemia, mixed 01/04/2012   Hypothyroidism    Pneumonia    Polycythemia secondary to smoking 01/04/2012   Primary hypercoagulable state (HCC) 10/01/2014   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HCC)    Sinus bradycardia, chronic 01/04/2012   Superficial thrombosis of lower extremity 05/02/2012    Past Surgical History:  Procedure Laterality Date   ABDOMINAL AORTOGRAM W/LOWER EXTREMITY Right 11/10/2021   Procedure: ABDOMINAL AORTOGRAM W/LOWER EXTREMITY;  Surgeon: Adine Hoof, MD;  Location: [facility] INVASIVE CV LAB;  Service: Cardiovascular;  Laterality: Right;   AORTA - BILATERAL FEMORAL ARTERY BYPASS GRAFT Bilateral 11/26/2021   Procedure: AORTO- BIILIAC ARTERY  BYPASS GRAFT USING HEMASHIELD 20 X , RIGHT ILIO-FEMORAL BYPASS  UMBILICAL HERNIA REPAIR;  Surgeon: Carlene Che, MD;  Location: [facility] OR;  Service: Vascular;  Laterality: Bilateral;   APPLICATION OF WOUND VAC Right 11/26/2021   Procedure: APPLICATION OF WOUND VAC;  Surgeon: Carlene Che, MD;  Location: [facility] OR;  Service: Vascular;  Laterality: Right;   DIRECT LARYNGOSCOPY Left 10/19/2018   Procedure: DIRECT LARYNGOSCOPY;  Surgeon: Reynold Caves, MD;  Location: [facility];  Service: ENT;  Laterality: Left;   FEMORAL-TIBIAL BYPASS GRAFT Right 11/26/2021   Procedure: RIGHT FEMORALTIBIAL  ARTERY BYPASS GRAFT USING PROPATEN GRAFT;  Surgeon: Carlene Che, MD;  Location: [facility] OR;  Service: Vascular;  Laterality: Right;   IR IMAGING GUIDED PORT INSERTION  11/04/2018   IR REMOVAL TUN ACCESS W/ PORT W/O FL MOD SED  05/01/2019   TONSILLECTOMY Left 10/19/2018   Procedure: BIOPSY OF LEFT TONSIL;  Surgeon: Reynold Caves, MD;  Location: [facility];  Service: ENT;  Laterality: Left;    Social History   Socioeconomic History   Marital status: Divorced    Spouse name: Not on file   Number of children: 2   Years of education: Not on file   Highest  education level: Bachelor's degree (e.g., BA, AB, BS)  Occupational History   Not on file  Tobacco Use   Smoking status: Former    Current packs/day: 0.00    Average packs/day: 1 pack/day for 30.0 years (30.0 ttl pk-yrs)    Types: Cigarettes    Start date: 10/18/1988    Quit date: 10/19/2018    Years since quitting: 5.1    Passive exposure: Never (1 a week)   Smokeless tobacco: Never  Vaping Use   Vaping status: Former   Substances: Nicotine , Flavoring   Devices: 1 cartridge q2-3 days, he tells me he is not using.   Substance and Sexual Activity   Alcohol use: Not Currently    Comment: occasionally. once a week   Drug use: Not Currently    Types: Marijuana   Sexual activity: Not on file  Other Topics Concern   Not on file  Social History Narrative   Not on file   Social Drivers of Health   Financial Resource Strain: Low Risk  (05/12/2023)   Overall Financial Resource Strain (CARDIA)    Difficulty of Paying Living Expenses: Not very hard  Food Insecurity: No Food Insecurity (05/12/2023)   Hunger Vital Sign    Worried About Running Out of Food in the Last Year: Never true    Ran Out of Food in the Last Year: Never true  Transportation Needs: No Transportation Needs (05/12/2023)   PRAPARE - Administrator, Civil Service (Medical): No    Lack of Transportation (Non-Medical): No  Physical Activity: Not on file  Stress: Not on file  Social Connections: Unknown (05/12/2023)   Social Connection and Isolation Panel [NHANES]    Frequency of Communication with Friends and Family: Twice a week    Frequency of Social Gatherings with Friends and Family: Once a week    Attends Religious Services: Patient declined    Database administrator or Organizations: Patient declined    Attends Banker Meetings: Not on file    Marital Status: Living with partner  Intimate Partner Violence: Not At Risk (10/21/2018)   Humiliation, Afraid, Rape, and Kick questionnaire     Fear of Current or Ex-Partner: No    Emotionally Abused: No    Physically Abused: No    Sexually Abused: No    Family History  Problem Relation Age of Onset   Stroke Father     Current Outpatient Medications  Medication Sig Dispense Refill   acetaminophen  (TYLENOL ) 325 MG tablet Take 2 tablets (650 mg total) by mouth every 6 (six) hours.     amoxicillin -clavulanate (AUGMENTIN ) 600-42.9 MG/5ML suspension Take 7.5 mLs by mouth 2 (two) times daily. 125 mL 0   atorvastatin  (LIPITOR) 20 MG tablet Take 1 tablet (20 mg total) by mouth daily. 90 tablet 3   cyclobenzaprine  (FLEXERIL ) 10 MG tablet Take 1 tablet (10 mg total) by mouth 3 (three) times daily as needed for  muscle spasms. 60 tablet 2   gabapentin  (NEURONTIN ) 100 MG capsule Take by mouth.     ibuprofen (ADVIL) 200 MG tablet Take 200 mg by mouth every 6 (six) hours as needed.     levothyroxine  (SYNTHROID ) 50 MCG tablet Take 1 tablet (50 mcg total) by mouth daily. 90 tablet 0   metoprolol  succinate (TOPROL -XL) 25 MG 24 hr tablet Take 1 tablet (25 mg total) by mouth daily. 90 tablet 3   oxyCODONE  (OXY IR/ROXICODONE ) 5 MG immediate release tablet Take 1 tablet (5 mg total) by mouth every 6 (six) hours as needed for severe pain (pain score 7-10). 90 tablet 0   pantoprazole  (PROTONIX ) 40 MG tablet Take 1 tablet (40 mg total) by mouth 2 (two) times daily. 60 tablet 1   polyethylene glycol (MIRALAX  / GLYCOLAX ) 17 g packet Take 17 g by mouth 2 (two) times daily. 14 each 0   rivaroxaban  (XARELTO ) 20 MG TABS tablet Take 1 tablet (20 mg total) by mouth daily with supper. 90 tablet 2   aspirin  EC 81 MG EC tablet Take 1 tablet (81 mg total) by mouth daily. Swallow whole. (Patient not taking: Reported on 12/28/2023) 30 tablet 11   No current facility-administered medications for this visit.    No Known Allergies   REVIEW OF SYSTEMS:  [X]  denotes positive finding, [ ]  denotes negative finding Cardiac  Comments:  Chest pain or chest pressure:     Shortness of breath upon exertion:    Short of breath when lying flat:    Irregular heart rhythm:        Vascular    Pain in calf, thigh, or hip brought on by ambulation:    Pain in feet at night that wakes you up from your sleep:     Blood clot in your veins:    Leg swelling:         Pulmonary    Oxygen  at home:    Productive cough:     Wheezing:         Neurologic    Sudden weakness in arms or legs:     Sudden numbness in arms or legs:     Sudden onset of difficulty speaking or slurred speech:    Temporary loss of vision in one eye:     Problems with dizziness:         Gastrointestinal    Blood in stool:     Vomited blood:         Genitourinary    Burning when urinating:     Blood in urine:        Psychiatric    Major depression:         Hematologic    Bleeding problems:    Problems with blood clotting too easily:        Skin    Rashes or ulcers:        Constitutional    Fever or chills:      PHYSICAL EXAMINATION:  Vitals:   12/28/23 1524  BP: 123/86  Pulse: 73  Temp: 98.2 F (36.8 C)  TempSrc: Temporal  SpO2: 96%  Weight: 230 lb (104.3 kg)  Height: 6' 3.5" (1.918 m)    General:  WDWN in NAD; vital signs documented above Gait: Not observed HENT: WNL, normocephalic Pulmonary: normal non-labored breathing Cardiac: regular HR Abdomen: soft, large reducible ventral/ incisional hernia Vascular Exam/Pulses: 2+ femoral pulses, Doppler right PT faint, left DP/Pero/ PT signals. Feet warm and well perfused  Extremities: without ischemic changes, without Gangrene , without cellulitis; without open wounds;  Musculoskeletal: no muscle wasting or atrophy  Neurologic: A&O X 3 Psychiatric:  The pt has Normal affect.   Non-Invasive Vascular Imaging:   +-------+-----------+-----------+------------+------------+  ABI/TBIToday's ABIToday's TBIPrevious ABIPrevious TBI  +-------+-----------+-----------+------------+------------+  Right 0.55       0           0.71        0             +-------+-----------+-----------+------------+------------+  Left  1.2        0.8        1.28        1.22          +-------+-----------+-----------+------------+------------+    ASSESSMENT/PLAN:: 63 y.o. male here for surveillance follow up of PAD. 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 This was performed by Dr. Edgardo Goodwill. Overall he is doing very well. He is without any claudication, rest pain or tissue loss. He has neuropathy in both lower extremities however this is not new. Medically managed on Gabapentin . - ABI's today are preserved. The right decreased from prior study 6 months ago but is the same as the ABI's before that  - Continue Xarelto  and statin. Recommend he restart his Aspirin  81 mg  - Recommend he follow up with General surgery regarding his hernia repair - Follow up in 6 months with ABI  Deneen Finical, PA-C [facility] [phone]  Clinic MD:   [facility]

## 2023-12-30 ENCOUNTER — Telehealth: Payer: Self-pay | Admitting: Radiation Oncology

## 2023-12-30 ENCOUNTER — Other Ambulatory Visit: Payer: Self-pay | Admitting: *Deleted

## 2023-12-30 DIAGNOSIS — I739 Peripheral vascular disease, unspecified: Secondary | ICD-10-CM

## 2023-12-30 DIAGNOSIS — Z95828 Presence of other vascular implants and grafts: Secondary | ICD-10-CM

## 2023-12-30 NOTE — Telephone Encounter (Signed)
 Received medical record request from [facility]. Faxed records and forwarded request to [employer].

## 2023-12-31 DIAGNOSIS — Z1211 Encounter for screening for malignant neoplasm of colon: Secondary | ICD-10-CM | POA: Diagnosis not present

## 2024-01-06 ENCOUNTER — Other Ambulatory Visit: Payer: Self-pay

## 2024-01-06 DIAGNOSIS — G893 Neoplasm related pain (acute) (chronic): Secondary | ICD-10-CM

## 2024-01-06 DIAGNOSIS — Z515 Encounter for palliative care: Secondary | ICD-10-CM

## 2024-01-06 DIAGNOSIS — L598 Other specified disorders of the skin and subcutaneous tissue related to radiation: Secondary | ICD-10-CM

## 2024-01-06 DIAGNOSIS — Y842 Radiological procedure and radiotherapy as the cause of abnormal reaction of the patient, or of later complication, without mention of misadventure at the time of the procedure: Secondary | ICD-10-CM

## 2024-01-06 LAB — COLOGUARD: COLOGUARD: POSITIVE — AB

## 2024-01-06 MED ORDER — OXYCODONE HCL 5 MG PO TABS: 5.0000 mg | ORAL_TABLET | Freq: Four times a day (QID) | ORAL | 0 refills | Status: DC | PRN

## 2024-01-06 NOTE — Telephone Encounter (Signed)
 Pt called asking for xarelto  to be sent in, forwarded to Dr.Dorsey.

## 2024-01-06 NOTE — Telephone Encounter (Signed)
 Pt called for medication refill, see associated orders

## 2024-01-07 ENCOUNTER — Ambulatory Visit: Payer: Self-pay | Admitting: Nurse Practitioner

## 2024-01-07 ENCOUNTER — Encounter: Payer: Self-pay | Admitting: Hematology

## 2024-01-07 DIAGNOSIS — R195 Other fecal abnormalities: Secondary | ICD-10-CM

## 2024-01-07 MED ORDER — RIVAROXABAN 20 MG PO TABS: 20.0000 mg | ORAL_TABLET | Freq: Every day | ORAL | 2 refills | Status: AC

## 2024-01-10 ENCOUNTER — Other Ambulatory Visit (INDEPENDENT_AMBULATORY_CARE_PROVIDER_SITE_OTHER)

## 2024-01-10 DIAGNOSIS — E039 Hypothyroidism, unspecified: Secondary | ICD-10-CM

## 2024-01-10 NOTE — Telephone Encounter (Signed)
 It looks like he has an appointment with lab not a provider. The recommendation is to do a colonoscopy. Can we see which area he prefers

## 2024-01-10 NOTE — Telephone Encounter (Signed)
-----   Message from [facility] T sent at 01/07/2024  3:11 PM EDT ----- Called patient reviewed all information and repeated back to me.  Pt states he has an appointment on Monday and will discuss his preference then.  ----- Message ----- From: Wendee Lynwood HERO, NP Sent: 01/07/2024   7:16 AM EDT To: Wendee Gunnels  Notified via My Chart   See below ----- Message ----- From: Interface, Lab In Three [facility] Sent: 11/16/2023  11:30 AM EDT To: Lynwood HERO Wendee, NP

## 2024-01-11 LAB — T3, FREE: T3, Free: 3.7 pg/mL (ref 2.3–4.2)

## 2024-01-11 LAB — T4, FREE: Free T4: 0.85 ng/dL (ref 0.60–1.60)

## 2024-01-11 LAB — TSH: TSH: 10.98 u[IU]/mL — ABNORMAL HIGH (ref 0.35–5.50)

## 2024-01-12 NOTE — Telephone Encounter (Signed)
 Referral placed.

## 2024-01-12 NOTE — Telephone Encounter (Signed)
-----   Message from [facility] T sent at 01/11/2024 11:42 AM EDT ----- Called pt. Pt prefers the [place] location for colonoscopy.  ----- Message ----- From: Wendee Lynwood HERO, NP Sent: 01/10/2024   2:59 PM EDT To: Wendee Gunnels  ----- Message from Lynwood HERO Wendee, NP sent at 01/10/2024  2:59 PM EDT -----   ----- Message ----- From: Sebastian Shu, CMA Sent: 01/07/2024   3:11 PM EDT To: Lynwood HERO Wendee, NP  Called patient reviewed all information and repeated back to me.  Pt states he has an appointment on Monday and will discuss his preference then.  ----- Message ----- From: Wendee Lynwood HERO, NP Sent: 01/07/2024   7:16 AM EDT To: Wendee Gunnels  Notified via My Chart   See below ----- Message ----- From: Interface, Lab In Three Zero One Sent: 11/16/2023  11:30 AM EDT To: Lynwood HERO Wendee, NP

## 2024-01-13 ENCOUNTER — Ambulatory Visit: Payer: Self-pay | Admitting: Nurse Practitioner

## 2024-02-04 ENCOUNTER — Encounter: Payer: Self-pay | Admitting: Advanced Practice Midwife

## 2024-02-07 ENCOUNTER — Other Ambulatory Visit: Payer: Self-pay

## 2024-02-07 DIAGNOSIS — L598 Other specified disorders of the skin and subcutaneous tissue related to radiation: Secondary | ICD-10-CM

## 2024-02-07 DIAGNOSIS — G893 Neoplasm related pain (acute) (chronic): Secondary | ICD-10-CM

## 2024-02-07 DIAGNOSIS — Z515 Encounter for palliative care: Secondary | ICD-10-CM

## 2024-02-07 MED ORDER — OXYCODONE HCL 5 MG PO TABS: 5.0000 mg | ORAL_TABLET | Freq: Four times a day (QID) | ORAL | 0 refills | Status: DC | PRN

## 2024-02-07 NOTE — Telephone Encounter (Signed)
 Pt called for medication refill. See associated orders.

## 2024-02-09 ENCOUNTER — Inpatient Hospital Stay: Attending: Hematology and Oncology | Admitting: Nurse Practitioner

## 2024-02-09 ENCOUNTER — Encounter: Payer: Self-pay | Admitting: Nurse Practitioner

## 2024-02-09 DIAGNOSIS — Z515 Encounter for palliative care: Secondary | ICD-10-CM | POA: Diagnosis not present

## 2024-02-09 DIAGNOSIS — C090 Malignant neoplasm of tonsillar fossa: Secondary | ICD-10-CM

## 2024-02-09 DIAGNOSIS — G893 Neoplasm related pain (acute) (chronic): Secondary | ICD-10-CM

## 2024-02-09 DIAGNOSIS — R530 Neoplastic (malignant) related fatigue: Secondary | ICD-10-CM

## 2024-02-09 NOTE — Progress Notes (Signed)
 Palliative Medicine [facility]  [phone] [phone]   Name: [name] [name] Date: 02/09/2024 MRN: [id number]  DOB: [birth year]  Patient Care Team: Wendee Lynwood HERO, NP as PCP - General (Nurse Practitioner) Freddie Lynwood HERO, MD as Consulting Physician (Oncology) Karis Clunes, MD as Consulting Physician (Otolaryngology) Izell Domino, MD as Attending Physician (Radiation Oncology) Maryelizabeth Callander, MD (Inactive) as Consulting Physician (Hematology) Alray Charlie LABOR, RN (Inactive) as Oncology Nurse Navigator Pickenpack-Cousar, Fannie SAILOR, NP as Nurse Practitioner (Nurse Practitioner)   I connected with [name] [name] on 02/09/24 at  4:00 PM EDT by telephone and verified that I am speaking with the correct person using two identifiers.   I discussed the limitations, risks, security and privacy concerns of performing an evaluation and management service by telemedicine and the availability of in-person appointments. I also discussed with the patient that there may be a patient responsible charge related to this service. The patient expressed understanding and agreed to proceed.   Other persons participating in the visit and their role in the encounter: N/A   Patient's location: Home   Provider's location: [facility]   INTERVAL HISTORY: [name] is a 63 y.o. 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 Palliative ask to see for symptom management.    SOCIAL HISTORY:     reports that he quit smoking about 5 years ago. His smoking use included cigarettes. He started smoking about 35 years ago. He has a 30 pack-year smoking history. He has never been exposed to tobacco smoke. He has never used smokeless tobacco. He reports that he does not currently use alcohol. He reports that he does not currently use drugs after having used the following drugs: Marijuana.  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   Right SFA 05/07/17 while on warfarin INR 2.9   Atrial fibrillation (HCC)    Benign essential HTN 01/04/2012   Chronic anticoagulation 01/02/2013   Dermatofibroma of forearm 01/02/2013   Left side   DVT, lower extremity, proximal (HCC) 05/24/2013   Right leg femoral & popliteal 05/24/13   Hyperlipidemia, mixed 01/04/2012   Hypothyroidism    Pneumonia    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  MEDICATIONS:  Current Outpatient Medications  Medication Sig Dispense Refill   acetaminophen  (TYLENOL ) 325 MG tablet Take 2 tablets (650 mg total) by mouth every 6 (six) hours.     amoxicillin -clavulanate (AUGMENTIN ) 600-42.9 MG/5ML suspension Take 7.5 mLs by mouth 2 (two) times daily. 125 mL 0   aspirin  EC 81 MG EC tablet Take 1 tablet (81 mg total) by mouth daily. Swallow whole. (Patient not taking: Reported on 12/28/2023) 30 tablet 11   atorvastatin  (LIPITOR) 20 MG tablet Take 1 tablet (20 mg total) by mouth daily. 90 tablet 3   cyclobenzaprine  (FLEXERIL ) 10 MG tablet Take 1 tablet (10 mg total) by mouth  3 (three) times daily as needed for muscle spasms. 60 tablet 2   gabapentin  (NEURONTIN ) 100 MG capsule Take by mouth.     ibuprofen (ADVIL) 200 MG tablet Take 200 mg by mouth every 6 (six) hours as needed.     levothyroxine  (SYNTHROID ) 50 MCG tablet Take 1 tablet (50 mcg total) by mouth daily.  90 tablet 0   metoprolol  succinate (TOPROL -XL) 25 MG 24 hr tablet Take 1 tablet (25 mg total) by mouth daily. 90 tablet 3   oxyCODONE  (OXY IR/ROXICODONE ) 5 MG immediate release tablet Take 1 tablet (5 mg total) by mouth every 6 (six) hours as needed for severe pain (pain score 7-10). 90 tablet 0   pantoprazole  (PROTONIX ) 40 MG tablet Take 1 tablet (40 mg total) by mouth 2 (two) times daily. 60 tablet 1   polyethylene glycol (MIRALAX  / GLYCOLAX ) 17 g packet Take 17 g by mouth 2 (two) times daily. 14 each 0   rivaroxaban  (XARELTO ) 20 MG TABS tablet Take 1 tablet (20 mg total) by mouth daily with supper. 90 tablet 2   No current facility-administered medications for this visit.    VITAL SIGNS: There were no vitals taken for this visit. There were no vitals filed for this visit.  Estimated body mass index is 28.37 kg/m as calculated from the following:   Height as of 12/28/23: 6' 3.5 (1.918 m).   Weight as of 12/28/23: 230 lb (104.3 kg).   PERFORMANCE STATUS (ECOG) : 1 - Symptomatic but completely ambulatory  IMPRESSION:  I connected with [name] [name] by phone for follow-up. No acute distress. Denies nausea, vomiting, constipation, or diarrhea. Appetite is minimal as he continue to undergo teeth extractions and oral interventions via [facility].  Appetite is fair.  Continues to consume mostly soft diet and liquids.   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 He is currently taking oxycodone  5 mg every 6 hours as needed for pain, Advil in between, and flexeril  as needed for spasms. Does not require oxycodone  daily. We will continue current regimen with close follow-up.  Taking as directed. Refills appropriate with prescription lasting greater than 45 days indicating patient is taking responsibly and as needed only.   We will continue to closely monitor and support as needed.   PLAN: Maintain close follow-up with [facility] s/p extractions Oxycodone  as needed for  pain. Not requiring daily Flexeril  as needed for muscle spasms We will continue to closely monitor and assist with symptom management. I will plan to see patient back in 6-8 weeks.  Patient expressed understanding and was in agreement with this plan. He also understands that He can call the clinic at any time with any questions, concerns, or complaints.      Any controlled substances utilized were prescribed in the context of palliative care. PDMP has been reviewed.   I provided 25 minutes of non face-to-face telephone visit time during this encounter, and > 50% was spent counseling as documented under my assessment & plan. Visit consisted of counseling and education dealing with the complex and emotionally intense issues of symptom management and palliative care in the setting of serious and potentially life-threatening illness.  Levon Borer, AGPCNP-BC  Palliative Medicine Team/[facility]

## 2024-02-14 ENCOUNTER — Other Ambulatory Visit: Payer: Self-pay | Admitting: Nurse Practitioner

## 2024-02-14 DIAGNOSIS — E039 Hypothyroidism, unspecified: Secondary | ICD-10-CM

## 2024-02-25 ENCOUNTER — Other Ambulatory Visit: Payer: Self-pay | Admitting: Nurse Practitioner

## 2024-02-25 DIAGNOSIS — E039 Hypothyroidism, unspecified: Secondary | ICD-10-CM

## 2024-03-09 ENCOUNTER — Other Ambulatory Visit: Payer: Self-pay

## 2024-03-09 DIAGNOSIS — Y842 Radiological procedure and radiotherapy as the cause of abnormal reaction of the patient, or of later complication, without mention of misadventure at the time of the procedure: Secondary | ICD-10-CM

## 2024-03-09 DIAGNOSIS — Z515 Encounter for palliative care: Secondary | ICD-10-CM

## 2024-03-09 DIAGNOSIS — G893 Neoplasm related pain (acute) (chronic): Secondary | ICD-10-CM

## 2024-03-09 MED ORDER — OXYCODONE HCL 5 MG PO TABS: 5.0000 mg | ORAL_TABLET | Freq: Four times a day (QID) | ORAL | 0 refills | Status: DC | PRN

## 2024-03-09 NOTE — Telephone Encounter (Signed)
 Refill request, please see associated orders.

## 2024-03-14 ENCOUNTER — Other Ambulatory Visit

## 2024-03-15 ENCOUNTER — Other Ambulatory Visit: Payer: Medicaid Other

## 2024-03-15 ENCOUNTER — Ambulatory Visit: Payer: Medicaid Other | Admitting: Hematology and Oncology

## 2024-03-16 ENCOUNTER — Other Ambulatory Visit: Payer: Self-pay | Admitting: Hematology and Oncology

## 2024-03-16 ENCOUNTER — Inpatient Hospital Stay (HOSPITAL_BASED_OUTPATIENT_CLINIC_OR_DEPARTMENT_OTHER): Admitting: Hematology and Oncology

## 2024-03-16 ENCOUNTER — Inpatient Hospital Stay: Attending: Hematology and Oncology

## 2024-03-16 VITALS — BP 132/92 | HR 86 | Temp 97.2°F | Resp 15 | Wt 228.7 lb

## 2024-03-16 DIAGNOSIS — E039 Hypothyroidism, unspecified: Secondary | ICD-10-CM | POA: Insufficient documentation

## 2024-03-16 DIAGNOSIS — F17210 Nicotine dependence, cigarettes, uncomplicated: Secondary | ICD-10-CM | POA: Diagnosis not present

## 2024-03-16 DIAGNOSIS — Z79899 Other long term (current) drug therapy: Secondary | ICD-10-CM | POA: Diagnosis not present

## 2024-03-16 DIAGNOSIS — Z1211 Encounter for screening for malignant neoplasm of colon: Secondary | ICD-10-CM | POA: Diagnosis not present

## 2024-03-16 DIAGNOSIS — Z7901 Long term (current) use of anticoagulants: Secondary | ICD-10-CM | POA: Insufficient documentation

## 2024-03-16 DIAGNOSIS — Z86718 Personal history of other venous thrombosis and embolism: Secondary | ICD-10-CM | POA: Diagnosis not present

## 2024-03-16 DIAGNOSIS — C099 Malignant neoplasm of tonsil, unspecified: Secondary | ICD-10-CM | POA: Insufficient documentation

## 2024-03-16 DIAGNOSIS — C090 Malignant neoplasm of tonsillar fossa: Secondary | ICD-10-CM

## 2024-03-16 LAB — CBC WITH DIFFERENTIAL (CANCER CENTER ONLY)
Abs Immature Granulocytes: 0.02 K/uL (ref 0.00–0.07)
Basophils Absolute: 0 K/uL (ref 0.0–0.1)
Basophils Relative: 1 %
Eosinophils Absolute: 0.2 K/uL (ref 0.0–0.5)
Eosinophils Relative: 4 %
HCT: 50.2 % (ref 39.0–52.0)
Hemoglobin: 17.1 g/dL — ABNORMAL HIGH (ref 13.0–17.0)
Immature Granulocytes: 0 %
Lymphocytes Relative: 22 %
Lymphs Abs: 1.3 K/uL (ref 0.7–4.0)
MCH: 32.8 pg (ref 26.0–34.0)
MCHC: 34.1 g/dL (ref 30.0–36.0)
MCV: 96.2 fL (ref 80.0–100.0)
Monocytes Absolute: 0.6 K/uL (ref 0.1–1.0)
Monocytes Relative: 10 %
Neutro Abs: 3.9 K/uL (ref 1.7–7.7)
Neutrophils Relative %: 63 %
Platelet Count: 194 K/uL (ref 150–400)
RBC: 5.22 MIL/uL (ref 4.22–5.81)
RDW: 13.9 % (ref 11.5–15.5)
WBC Count: 6.1 K/uL (ref 4.0–10.5)
nRBC: 0 % (ref 0.0–0.2)

## 2024-03-16 LAB — CMP (CANCER CENTER ONLY)
ALT: 19 U/L (ref 0–44)
AST: 27 U/L (ref 15–41)
Albumin: 4.2 g/dL (ref 3.5–5.0)
Alkaline Phosphatase: 91 U/L (ref 38–126)
Anion gap: 8 (ref 5–15)
BUN: 9 mg/dL (ref 8–23)
CO2: 27 mmol/L (ref 22–32)
Calcium: 9.6 mg/dL (ref 8.9–10.3)
Chloride: 106 mmol/L (ref 98–111)
Creatinine: 0.97 mg/dL (ref 0.61–1.24)
GFR, Estimated: >60 mL/min (ref >60)
Glucose, Bld: 112 mg/dL — ABNORMAL HIGH (ref 70–99)
Potassium: 3.8 mmol/L (ref 3.5–5.1)
Sodium: 141 mmol/L (ref 135–145)
Total Bilirubin: 0.8 mg/dL (ref 0.0–1.2)
Total Protein: 7 g/dL (ref 6.5–8.1)

## 2024-03-16 MED ORDER — NYSTATIN 100000 UNIT/ML MT SUSP: 5.0000 mL | Freq: Four times a day (QID) | OROMUCOSAL | 2 refills | Status: DC

## 2024-03-16 NOTE — Progress Notes (Signed)
 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 Karis, invasive SCCa, p16+; not a candidate for TORS -Late 10/2018 - 12/2018: definitive chemoradiation with weekly cisplatin   03/2019: end-of-treatment PET showed decrease in FDG avidity in the left tonsil but some residual soft tissue fullness, resolution of left LN disease   -2021:  Diffuse pharyngeal soft tissue thickening at the tongue base on multiple CT's  Necrotic tissue on multiple biopsies at [facility], suggestive of radionecrosis; no evidence of malignancy    #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 -12/2018: acute DVT involving R peroneal vein and L gastrocnemius vein; new arterial thrombosis involving R common femoral artery and popliteal arteries, due to artery aneurysm -Late 12/2018: progression of acute DVT in the LLE from gastrocnemius vein to the femoral, popliteal, posterior tibial and peroneal veins despite being on Eliquis   -Currently on warfarin  11/25/2020: Developed a right upper extremity deep and superficial vein thromboses secondary to line placement.  Started on Lovenox  1 mg/kg  every 12 hours. 02/21/2021: Transition to Xarelto  therapy.  Interval History:  [name] [name] 63 y.o. male returns for a follow up for history of recurrent VTE and history of stage 1 SCC of tonsil. He was last seen on 09/15/2023. He is unaccompanied for this visit.   Mr. Wallen reports he has been okay in the interim since our last visit.  He has had no major trips or travel this summer.  He reports that he has had some worked on the house including fixing the fence and replacing the wood and the deck.  He reports that his energy levels have been okay.  He did have his teeth pulled this summer which was supposed to help some with his jaw.  He reports that his appetite is good but he still mostly on a liquid diet and cannot open his mouth much.  He continues to have pain bilaterally in the jaw.  He reports that he is being followed by dentistry and surgery team at [facility] but has been avoiding the surgery.  He reports that he does continue to have some tightness in his neck but no bumps or lumps concerning for lymphadenopathy.  He continues to take his thyroid  medication as prescribed.  He reports he is not smoking seriously but does smoke a cigarette here and there occasionally.  Otherwise he denies any fevers, chills, sweats, nausea, vomiting or diarrhea.  A full 10 point ROS is otherwise negative.  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   Right SFA 05/07/17 while on warfarin INR 2.9   Arterial embolus and thrombosis of lower extremity (  HCC) 05/25/2017   Right SFA 05/07/17 while on warfarin INR 2.9   Atrial fibrillation (HCC)    Benign essential HTN 01/04/2012   Chronic anticoagulation 01/02/2013   Dermatofibroma of forearm 01/02/2013   Left side   DVT, lower extremity, proximal (HCC) 05/24/2013   Right leg femoral & popliteal  05/24/13   Hyperlipidemia, mixed 01/04/2012   Hypothyroidism    Pneumonia    Polycythemia secondary to smoking 01/04/2012   Primary hypercoagulable state (HCC) 10/01/2014   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HCC)    Sinus bradycardia, chronic 01/04/2012   Superficial thrombosis of lower extremity 05/02/2012    SURGICAL HISTORY: Past Surgical History:  Procedure Laterality Date   ABDOMINAL AORTOGRAM W/LOWER EXTREMITY Right 11/10/2021   Procedure: ABDOMINAL AORTOGRAM W/LOWER EXTREMITY;  Surgeon: Sheree Penne Bruckner, MD;  Location: [facility] INVASIVE CV LAB;  Service: Cardiovascular;  Laterality: Right;   AORTA - BILATERAL FEMORAL ARTERY BYPASS GRAFT Bilateral 11/26/2021   Procedure: AORTO- BIILIAC ARTERY  BYPASS GRAFT USING HEMASHIELD 20 X , RIGHT ILIO-FEMORAL BYPASS  UMBILICAL HERNIA REPAIR;  Surgeon: Magda Debby SAILOR, MD;  Location: [facility] OR;  Service: Vascular;  Laterality: Bilateral;   APPLICATION OF WOUND VAC Right 11/26/2021   Procedure: APPLICATION OF WOUND VAC;  Surgeon: Magda Debby SAILOR, MD;  Location: [facility] OR;  Service: Vascular;  Laterality: Right;   DIRECT LARYNGOSCOPY Left 10/19/2018   Procedure: DIRECT LARYNGOSCOPY;  Surgeon: Karis Clunes, MD;  Location: [facility];  Service: ENT;  Laterality: Left;   FEMORAL-TIBIAL BYPASS GRAFT Right 11/26/2021   Procedure: RIGHT FEMORALTIBIAL  ARTERY BYPASS GRAFT USING PROPATEN GRAFT;  Surgeon: Magda Debby SAILOR, MD;  Location: [facility] OR;  Service: Vascular;  Laterality: Right;   IR IMAGING GUIDED PORT INSERTION  11/04/2018   IR REMOVAL TUN ACCESS W/ PORT W/O FL MOD SED  05/01/2019   TONSILLECTOMY Left 10/19/2018   Procedure: BIOPSY OF LEFT TONSIL;  Surgeon: Karis Clunes, MD;  Location: [facility];  Service: ENT;  Laterality: Left;    SOCIAL HISTORY: Social History   Socioeconomic History   Marital status: Divorced    Spouse name: Not on file   Number of children: 2   Years of education: Not on file   Highest education  level: Bachelor's degree (e.g., BA, AB, BS)  Occupational History   Not on file  Tobacco Use   Smoking status: Former    Current packs/day: 0.00    Average packs/day: 1 pack/day for 30.0 years (30.0 ttl pk-yrs)    Types: Cigarettes    Start date: 10/18/1988    Quit date: 10/19/2018    Years since quitting: 5.4    Passive exposure: Never (1 a week)   Smokeless tobacco: Never  Vaping Use   Vaping status: Former   Substances: Nicotine , Flavoring   Devices: 1 cartridge q2-3 days, he tells me he is not using.   Substance and Sexual Activity   Alcohol use: Not Currently    Comment: occasionally. once a week   Drug use: Not Currently    Types: Marijuana   Sexual activity: Not on file  Other Topics Concern   Not on file  Social History Narrative   Not on file   Social Drivers of Health   Financial Resource Strain: Low Risk  (05/12/2023)   Overall Financial Resource Strain (CARDIA)    Difficulty of Paying Living Expenses: Not very hard  Food Insecurity: No Food Insecurity (05/12/2023)   Hunger Vital Sign    Worried  About Running Out of Food in the Last Year: Never true    Ran Out of Food in the Last Year: Never true  Transportation Needs: No Transportation Needs (05/12/2023)   PRAPARE - Administrator, Civil Service (Medical): No    Lack of Transportation (Non-Medical): No  Physical Activity: Not on file  Stress: Not on file  Social Connections: Unknown (05/12/2023)   Social Connection and Isolation Panel    Frequency of Communication with Friends and Family: Twice a week    Frequency of Social Gatherings with Friends and Family: Once a week    Attends Religious Services: Patient declined    Database administrator or Organizations: Patient declined    Attends Banker Meetings: Not on file    Marital Status: Living with partner  Intimate Partner Violence: Not At Risk (10/21/2018)   Humiliation, Afraid, Rape, and Kick questionnaire    Fear of Current or  Ex-Partner: No    Emotionally Abused: No    Physically Abused: No    Sexually Abused: No    FAMILY HISTORY: Family History  Problem Relation Age of Onset   Stroke Father     ALLERGIES:  has no known allergies.  MEDICATIONS:  Current Outpatient Medications  Medication Sig Dispense Refill   aspirin  EC 81 MG EC tablet Take 1 tablet (81 mg total) by mouth daily. Swallow whole. 30 tablet 11   acetaminophen  (TYLENOL ) 325 MG tablet Take 2 tablets (650 mg total) by mouth every 6 (six) hours.     atorvastatin  (LIPITOR) 20 MG tablet Take 1 tablet (20 mg total) by mouth daily. 90 tablet 3   cyclobenzaprine  (FLEXERIL ) 10 MG tablet Take 1 tablet (10 mg total) by mouth 3 (three) times daily as needed for muscle spasms. 60 tablet 2   gabapentin  (NEURONTIN ) 100 MG capsule Take by mouth.     ibuprofen (ADVIL) 200 MG tablet Take 200 mg by mouth every 6 (six) hours as needed.     levothyroxine  (SYNTHROID ) 50 MCG tablet TAKE 1 TABLET BY MOUTH EVERY DAY 90 tablet 0   metoprolol  succinate (TOPROL -XL) 25 MG 24 hr tablet Take 1 tablet (25 mg total) by mouth daily. 90 tablet 3   oxyCODONE  (OXY IR/ROXICODONE ) 5 MG immediate release tablet Take 1 tablet (5 mg total) by mouth every 6 (six) hours as needed for severe pain (pain score 7-10). 90 tablet 0   pantoprazole  (PROTONIX ) 40 MG tablet Take 1 tablet (40 mg total) by mouth 2 (two) times daily. 60 tablet 1   polyethylene glycol (MIRALAX  / GLYCOLAX ) 17 g packet Take 17 g by mouth 2 (two) times daily. 14 each 0   rivaroxaban  (XARELTO ) 20 MG TABS tablet Take 1 tablet (20 mg total) by mouth daily with supper. 90 tablet 2   No current facility-administered medications for this visit.    REVIEW OF SYSTEMS:   Constitutional: ( - ) fevers, ( - )  chills , ( - ) night sweats Eyes: ( - ) blurriness of vision, ( - ) double vision, ( - ) watery eyes Ears, nose, mouth, throat, and face: ( - ) mucositis, ( - ) sore throat Respiratory: ( - ) cough, ( - ) dyspnea, ( - )  wheezes Cardiovascular: ( - ) palpitation, ( - ) chest discomfort, ( - ) lower extremity swelling Gastrointestinal:  ( - ) nausea, ( - ) heartburn, ( - ) change in bowel habits Skin: ( - ) abnormal skin rashes  Lymphatics: ( - ) new lymphadenopathy, ( - ) easy bruising Neurological: ( - ) numbness, ( - ) tingling, ( - ) new weaknesses Behavioral/Psych: ( - ) mood change, ( - ) new changes  All other systems were reviewed with the patient and are negative.  PHYSICAL EXAMINATION: ECOG PERFORMANCE STATUS: 1 - Symptomatic but completely ambulatory  Vitals:   03/16/24 1519  BP: (!) 132/92  Pulse: 86  Resp: 15  Temp: (!) 97.2 F (36.2 C)  SpO2: 97%    Filed Weights   03/16/24 1519  Weight: 228 lb 11.2 oz (103.7 kg)     GENERAL: well appearing middle aged Caucasian male alert, no distress and comfortable SKIN: skin color, texture, turgor are normal, no rashes or significant lesions EYES: conjunctiva are pink and non-injected, sclera clear OROPHARYNX: exam limited as patient cannot open his mouth wide. Thick secretions note.  LUNGS: clear to auscultation and percussion with normal breathing effort HEART: regular rate & rhythm and no murmurs and no lower extremity edema PSYCH: alert & oriented x 3, fluent speech NEURO: no focal motor/sensory deficits  LABORATORY DATA:  I have reviewed the data as listed    Latest Ref Rng & Units 03/16/2024    2:56 PM 11/15/2023    3:58 PM 09/15/2023    2:09 PM  CBC  WBC 4.0 - 10.5 K/uL 6.1  7.9  6.2   Hemoglobin 13.0 - 17.0 g/dL 82.8  83.2  83.5   Hematocrit 39.0 - 52.0 % 50.2  49.3  48.6   Platelets 150 - 400 K/uL 194  220.0  160        Latest Ref Rng & Units 03/16/2024    2:56 PM 11/15/2023    3:58 PM 09/15/2023    2:09 PM  CMP  Glucose 70 - 99 mg/dL 887  91  887   BUN 8 - 23 mg/dL 9  10  8    Creatinine 0.61 - 1.24 mg/dL 9.02  8.98  8.94   Sodium 135 - 145 mmol/L 141  139  139   Potassium 3.5 - 5.1 mmol/L 3.8  4.6  3.7   Chloride 98 -  111 mmol/L 106  100  104   CO2 22 - 32 mmol/L 27  30  26    Calcium  8.9 - 10.3 mg/dL 9.6  9.9  9.9   Total Protein 6.5 - 8.1 g/dL 7.0  6.9  7.3   Total Bilirubin 0.0 - 1.2 mg/dL 0.8  0.6  0.9   Alkaline Phos 38 - 126 U/L 91  89  106   AST 15 - 41 U/L 27  24  24    ALT 0 - 44 U/L 19  19  17      RADIOGRAPHIC STUDIES: No results found.   ASSESSMENT & PLAN ZAEDYN COVIN 63 y.o. male with medical history significant for squamous cell carcinoma of left tonsil presents for a follow up visit.    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 Thana Rex, ENT at [facility] -Multiple discussions were held with radiation oncology and ENT at [facility].  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 Patient holding on that plan at this time.  --Patient was referred to [facility] ENT for further evaluation. He saw Dr. Barbie on 04/24/2022 who recommended segmental mandibulectomy with left scap tip free flap.  -- Scheduled for dental work next week to begin the process of having his jaw worked on.  #Hypothyroidisim, radiation induced --current on levothyroxine  25 mcg PO daily.  --TSH 12.819, Free T4 normal at last check .Will recheck today.  --Recommend to continue current dose of levothyroxine .  #Recurrent LLE DVT #Acute Right Upper Extremity DVT/SVT --patient had a midline placed for abx, developed a subsequent RUE  DVT --this is considered provoked in the setting of a catheter, though patient is prone to clotting. --previously on therapeutic 1mg /kg lovenox  x 3 months since May 2022. HOLD coumadin . -- Recommended the patient not return to Coumadin  therapy --continue Xarelto  20mg  PO daily.  --Labs today show white blood cell count 6.1, Hgb 17.1, MCV 96.2, Plt 194  --Patient requires indefinite anticoagulation   #Pain Control --Under the care of palliative care NP, Nikki --Regimen includes oxycodone  5mg  q6H PRN and Flexeril  10 mg TID PRN.   Follow up: --Labs and follow up in 12 months  No orders of the defined types were placed in this encounter.   All questions were answered. The patient knows to call the clinic with any problems, questions or concerns.  I have spent a total of 30 minutes minutes of face-to-face and non-face-to-face time, preparing to see the patient, performing a medically appropriate examination, counseling and educating the patient, ordering meds, documenting clinical information in the electronic health record, and care coordination.   Norleen IVAR Kidney, MD Department of Hematology/Oncology [facility] Phone: [phone] Pager: [phone] Email: [email or web address]

## 2024-03-17 LAB — T4: T4, Total: 6.9 ug/dL (ref 4.5–12.0)

## 2024-03-21 LAB — TSH: TSH: 7.81 u[IU]/mL — ABNORMAL HIGH (ref 0.350–4.500)

## 2024-03-27 ENCOUNTER — Encounter: Payer: Self-pay | Admitting: Nurse Practitioner

## 2024-03-27 ENCOUNTER — Inpatient Hospital Stay: Attending: Hematology and Oncology | Admitting: Nurse Practitioner

## 2024-03-27 DIAGNOSIS — C090 Malignant neoplasm of tonsillar fossa: Secondary | ICD-10-CM | POA: Diagnosis not present

## 2024-03-27 DIAGNOSIS — Z515 Encounter for palliative care: Secondary | ICD-10-CM | POA: Diagnosis not present

## 2024-03-27 DIAGNOSIS — G893 Neoplasm related pain (acute) (chronic): Secondary | ICD-10-CM | POA: Diagnosis not present

## 2024-03-27 MED ORDER — NYSTATIN 100000 UNIT/ML MT SUSP: 5.0000 mL | Freq: Four times a day (QID) | OROMUCOSAL | 2 refills | Status: AC

## 2024-03-27 NOTE — Progress Notes (Signed)
 Palliative Medicine [facility]  Telephone:(336) [phone] Fax:(336) [phone]   Name: [name] [name] Date: 03/27/2024 MRN: [id number]  DOB: [birth year]  Patient Care Team: Wendee Lynwood HERO, NP as PCP - General (Nurse Practitioner) Freddie Lynwood HERO, MD as Consulting Physician (Oncology) Karis Clunes, MD as Consulting Physician (Otolaryngology) Izell Domino, MD as Attending Physician (Radiation Oncology) Maryelizabeth Callander, MD (Inactive) as Consulting Physician (Hematology) Pickenpack-Cousar, Fannie SAILOR, NP as Nurse Practitioner (Nurse Practitioner)   I connected with [name] on 03/27/24 at  4:00 PM EDT by telephone and verified that I am speaking with the correct person using two identifiers.   I discussed the limitations, risks, security and privacy concerns of performing an evaluation and management service by telemedicine and the availability of in-person appointments. I also discussed with the patient that there may be a patient responsible charge related to this service. The patient expressed understanding and agreed to proceed.   Other persons participating in the visit and their role in the encounter: N/A   Patient's location: Home   Provider's location: [facility]   INTERVAL HISTORY: [name] [name] is a 63 y.o. 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 Palliative ask to see for symptom management.    SOCIAL HISTORY:     reports that he quit smoking about 5 years ago. His smoking use included cigarettes. He started smoking about 35 years ago. He has a 30 pack-year smoking history. He has never been exposed to tobacco smoke. He has never used smokeless tobacco. He reports that he does not currently use alcohol. He reports that he does not currently use drugs after having used the following drugs: Marijuana.  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   Right SFA 05/07/17 while on warfarin INR 2.9   Atrial fibrillation (HCC)    Benign essential HTN 01/04/2012   Chronic anticoagulation 01/02/2013   Dermatofibroma of forearm 01/02/2013   Left side   DVT, lower extremity, proximal (HCC) 05/24/2013   Right leg femoral & popliteal 05/24/13   Hyperlipidemia, mixed 01/04/2012   Hypothyroidism    Pneumonia    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  MEDICATIONS:  Current Outpatient Medications  Medication Sig Dispense Refill   acetaminophen  (TYLENOL ) 325 MG tablet Take 2 tablets (650 mg total) by mouth every 6 (six) hours.     aspirin  EC 81 MG EC tablet Take 1 tablet (81 mg total) by mouth daily. Swallow whole. 30 tablet 11   atorvastatin  (LIPITOR) 20 MG tablet Take 1 tablet (20 mg total) by mouth daily. 90 tablet 3   cyclobenzaprine  (FLEXERIL ) 10 MG tablet Take 1 tablet (10 mg total) by mouth 3 (three) times daily as needed for muscle spasms. 60 tablet 2   gabapentin  (NEURONTIN ) 100 MG capsule Take by mouth.     ibuprofen (ADVIL) 200 MG tablet Take 200 mg  by mouth every 6 (six) hours as needed.     levothyroxine  (SYNTHROID ) 50 MCG tablet TAKE 1 TABLET BY MOUTH EVERY DAY 90 tablet 0   metoprolol  succinate (TOPROL -XL) 25 MG 24 hr tablet Take 1 tablet (25 mg total) by mouth daily. 90 tablet 3   nystatin  (MYCOSTATIN ) 100000 UNIT/ML suspension Take 5 mLs (500,000 Units total) by mouth 4 (four) times  daily. Swish and spit. Do not swallow. 473 mL 2   oxyCODONE  (OXY IR/ROXICODONE ) 5 MG immediate release tablet Take 1 tablet (5 mg total) by mouth every 6 (six) hours as needed for severe pain (pain score 7-10). 90 tablet 0   pantoprazole  (PROTONIX ) 40 MG tablet Take 1 tablet (40 mg total) by mouth 2 (two) times daily. 60 tablet 1   polyethylene glycol (MIRALAX  / GLYCOLAX ) 17 g packet Take 17 g by mouth 2 (two) times daily. 14 each 0   rivaroxaban  (XARELTO ) 20 MG TABS tablet Take 1 tablet (20 mg total) by mouth daily with supper. 90 tablet 2   No current facility-administered medications for this visit.    VITAL SIGNS: There were no vitals taken for this visit. There were no vitals filed for this visit.  Estimated body mass index is 28.21 kg/m as calculated from the following:   Height as of 12/28/23: 6' 3.5 (1.918 m).   Weight as of 03/16/24: 228 lb 11.2 oz (103.7 kg).   PERFORMANCE STATUS (ECOG) : 1 - Symptomatic but completely ambulatory  IMPRESSION:  I connected with Mr. Benninger by phone for follow-up. No acute distress. Denies nausea, vomiting, constipation, or diarrhea.  Appetite is fair.  Continues to consume mostly soft diet and liquids. Patient seen by Dr. Federico last week and started on nystatin  mouth rinse due to oral yeast. Reports some improvement in symptoms but not completely resolved.   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 He is currently taking oxycodone  5 mg every 6 hours as needed for pain, Advil in between, and flexeril  as needed for spasms. Does not require oxycodone  daily. We will continue current regimen with close follow-up.  Taking as directed. Refills appropriate with prescription lasting greater than 45 days indicating patient is taking responsibly and as needed only.   We will continue to closely monitor and support as needed.   PLAN: Maintain close follow-up with [facility] s/p extractions Oxycodone  as needed for pain.  Not requiring daily Flexeril  as needed for muscle spasms We will continue to closely monitor and assist with symptom management. I will plan to see patient back in 6-8 weeks.  Patient expressed understanding and was in agreement with this plan. He also understands that He can call the clinic at any time with any questions, concerns, or complaints.      Any controlled substances utilized were prescribed in the context of palliative care. PDMP has been reviewed.   I provided 25 minutes of non face-to-face telephone visit time during this encounter, and > 50% was spent counseling as documented under my assessment & plan. Visit consisted of counseling and education dealing with the complex and emotionally intense issues of symptom management and palliative care in the setting of serious and potentially life-threatening illness.  Levon Borer, AGPCNP-BC  Palliative Medicine Team/[facility]

## 2024-04-04 ENCOUNTER — Telehealth

## 2024-04-11 ENCOUNTER — Telehealth: Payer: Self-pay

## 2024-04-11 NOTE — Telephone Encounter (Signed)
 Pt needs to be scheduled for his colonoscopy.  He takes Xarelto  20mg  daily.  History of Afib however he doe not follow a cardiologist for this.  Pt has been advised that prior to scheduling his colonoscopy I will need to obtain clearance from his PCP.

## 2024-04-13 ENCOUNTER — Other Ambulatory Visit: Payer: Self-pay

## 2024-04-13 DIAGNOSIS — G893 Neoplasm related pain (acute) (chronic): Secondary | ICD-10-CM

## 2024-04-13 DIAGNOSIS — Z515 Encounter for palliative care: Secondary | ICD-10-CM

## 2024-04-13 DIAGNOSIS — Y842 Radiological procedure and radiotherapy as the cause of abnormal reaction of the patient, or of later complication, without mention of misadventure at the time of the procedure: Secondary | ICD-10-CM

## 2024-04-17 MED ORDER — OXYCODONE HCL 5 MG PO TABS: 5.0000 mg | ORAL_TABLET | Freq: Four times a day (QID) | ORAL | 0 refills | Status: DC | PRN

## 2024-05-02 ENCOUNTER — Telehealth: Payer: Self-pay | Admitting: Nurse Practitioner

## 2024-05-02 NOTE — Telephone Encounter (Signed)
 Got a clearance from GI for a colonoscopy but I don t see this is scheduled. Can we call and verify if this is still needed?

## 2024-05-03 NOTE — Telephone Encounter (Signed)
 Left voicemail for patient to call the office back.

## 2024-05-05 NOTE — Telephone Encounter (Signed)
 Left voicemail for patient to call the office back.

## 2024-05-08 ENCOUNTER — Inpatient Hospital Stay: Attending: Hematology and Oncology | Admitting: Nurse Practitioner

## 2024-05-08 ENCOUNTER — Encounter: Payer: Self-pay | Admitting: Nurse Practitioner

## 2024-05-08 DIAGNOSIS — Y842 Radiological procedure and radiotherapy as the cause of abnormal reaction of the patient, or of later complication, without mention of misadventure at the time of the procedure: Secondary | ICD-10-CM

## 2024-05-08 DIAGNOSIS — G893 Neoplasm related pain (acute) (chronic): Secondary | ICD-10-CM

## 2024-05-08 DIAGNOSIS — R530 Neoplastic (malignant) related fatigue: Secondary | ICD-10-CM

## 2024-05-08 DIAGNOSIS — C090 Malignant neoplasm of tonsillar fossa: Secondary | ICD-10-CM

## 2024-05-08 DIAGNOSIS — L598 Other specified disorders of the skin and subcutaneous tissue related to radiation: Secondary | ICD-10-CM

## 2024-05-08 DIAGNOSIS — Z515 Encounter for palliative care: Secondary | ICD-10-CM | POA: Diagnosis not present

## 2024-05-08 NOTE — Progress Notes (Signed)
 Palliative Medicine [facility]  Telephone:(336) [phone] Fax:(336) [phone]   Name: [name] [name] Date: 05/08/2024 MRN: [id number]  DOB: May 11, 1961  Patient Care Team: Wendee Lynwood HERO, NP as PCP - General (Nurse Practitioner) Freddie Lynwood HERO, MD as Consulting Physician (Oncology) Karis Clunes, MD as Consulting Physician (Otolaryngology) Izell Domino, MD as Attending Physician (Radiation Oncology) Maryelizabeth Callander, MD (Inactive) as Consulting Physician (Hematology) Pickenpack-Cousar, Fannie SAILOR, NP as Nurse Practitioner (Nurse Practitioner)   I connected with [name] [name] on 05/08/24 at  4:00 PM EDT by telephone and verified that I am speaking with the correct person using two identifiers.   I discussed the limitations, risks, security and privacy concerns of performing an evaluation and management service by telemedicine and the availability of in-person appointments. I also discussed with the patient that there may be a patient responsible charge related to this service. The patient expressed understanding and agreed to proceed.   Other persons participating in the visit and their role in the encounter: N/A   Patient's location: Home   Provider's location: [facility]   INTERVAL HISTORY: [name] [name] is a 63 y.o. 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 Palliative ask to see for symptom management.    SOCIAL HISTORY:     reports that he quit smoking about 5 years ago. His smoking use included cigarettes. He started smoking about 35 years ago. He has a 30 pack-year smoking history. He has never been exposed to tobacco smoke. He has never used smokeless tobacco. He reports that he does not currently use alcohol. He reports that he does not currently use drugs after having used the following drugs: Marijuana.  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   Right SFA 05/07/17 while on warfarin INR 2.9   Atrial fibrillation (HCC)    Benign essential HTN 01/04/2012   Chronic anticoagulation 01/02/2013   Dermatofibroma of forearm 01/02/2013   Left side   DVT, lower extremity, proximal (HCC) 05/24/2013   Right leg femoral & popliteal 05/24/13   Hyperlipidemia, mixed 01/04/2012   Hypothyroidism    Pneumonia    Polycythemia secondary to smoking 01/04/2012   Primary hypercoagulable state 10/01/2014   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HCC)    Sinus bradycardia, chronic 01/04/2012   Superficial thrombosis of lower extremity 05/02/2012    ALLERGIES:  has no known allergies.  MEDICATIONS:  Current Outpatient Medications  Medication Sig Dispense Refill   acetaminophen  (TYLENOL ) 325 MG tablet Take 2 tablets (650 mg total) by mouth every 6 (six) hours.     aspirin  EC 81 MG EC tablet Take 1 tablet (81 mg total) by mouth daily. Swallow whole. 30 tablet 11   atorvastatin  (LIPITOR) 20 MG tablet Take 1 tablet (20 mg total) by mouth daily. 90 tablet 3   cyclobenzaprine  (FLEXERIL ) 10 MG tablet Take 1 tablet (10 mg total) by mouth 3 (three) times daily as needed for muscle spasms. 60 tablet 2   gabapentin  (NEURONTIN ) 100 MG capsule Take by mouth.     ibuprofen (ADVIL) 200 MG tablet Take 200 mg by mouth  every 6 (six) hours as needed.     levothyroxine  (SYNTHROID ) 50 MCG tablet TAKE 1 TABLET BY MOUTH EVERY DAY 90 tablet 0   metoprolol  succinate (TOPROL -XL) 25 MG 24 hr tablet Take 1 tablet (25 mg total) by mouth daily. 90 tablet 3   nystatin  (MYCOSTATIN ) 100000 UNIT/ML suspension Take 5 mLs (500,000 Units total) by mouth 4 (four) times daily. Swish  and spit. Do not swallow. 473 mL 2   oxyCODONE  (OXY IR/ROXICODONE ) 5 MG immediate release tablet Take 1 tablet (5 mg total) by mouth every 6 (six) hours as needed for severe pain (pain score 7-10). 90 tablet 0   pantoprazole  (PROTONIX ) 40 MG tablet Take 1 tablet (40 mg total) by mouth 2 (two) times daily. 60 tablet 1   polyethylene glycol (MIRALAX  / GLYCOLAX ) 17 g packet Take 17 g by mouth 2 (two) times daily. 14 each 0   rivaroxaban  (XARELTO ) 20 MG TABS tablet Take 1 tablet (20 mg total) by mouth daily with supper. 90 tablet 2   No current facility-administered medications for this visit.    VITAL SIGNS: There were no vitals taken for this visit. There were no vitals filed for this visit.  Estimated body mass index is 28.21 kg/m as calculated from the following:   Height as of 12/28/23: 6' 3.5 (1.918 m).   Weight as of 03/16/24: 228 lb 11.2 oz (103.7 kg).   PERFORMANCE STATUS (ECOG) : 1 - Symptomatic but completely ambulatory  IMPRESSION:  I connected with [name] [name] by phone for follow-up. No acute distress. Denies nausea, vomiting, constipation, or diarrhea.  Appetite is fair.  Continues to consume mostly soft diet and liquids. Occasional fatigue. Remains active.   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 He is currently taking oxycodone  5 mg every 6 hours as needed for pain, Advil in between, and flexeril  as needed for spasms. Does not require oxycodone  daily. We will continue current regimen with close follow-up.  Taking as directed. No adjustments to current regimen.   We will continue to closely monitor and support as needed.   PLAN: Maintain close follow-up with [facility] s/p extractions Oxycodone  as needed for pain. Not requiring daily Flexeril  as needed for muscle spasms We will continue to closely monitor and assist with symptom management. I will plan to see patient back in 6-8 weeks.  Patient expressed understanding and was in  agreement with this plan. He also understands that He can call the clinic at any time with any questions, concerns, or complaints.      Any controlled substances utilized were prescribed in the context of palliative care. PDMP has been reviewed.   I provided 25 minutes of non face-to-face telephone visit time during this encounter, and > 50% was spent counseling as documented under my assessment & plan. Visit consisted of counseling and education dealing with the complex and emotionally intense issues of symptom management and palliative care in the setting of serious and potentially life-threatening illness.  Levon Borer, AGPCNP-BC  Palliative Medicine Team/[facility]

## 2024-05-09 NOTE — Telephone Encounter (Signed)
 Called pt. He is not sure if he needs colonoscopy or not. States he will call the office that spoke with him about A-fib. Unlcear.

## 2024-05-18 ENCOUNTER — Other Ambulatory Visit: Payer: Self-pay | Admitting: Nurse Practitioner

## 2024-05-18 ENCOUNTER — Other Ambulatory Visit: Payer: Self-pay

## 2024-05-18 DIAGNOSIS — E039 Hypothyroidism, unspecified: Secondary | ICD-10-CM

## 2024-05-18 DIAGNOSIS — G893 Neoplasm related pain (acute) (chronic): Secondary | ICD-10-CM

## 2024-05-18 DIAGNOSIS — Z515 Encounter for palliative care: Secondary | ICD-10-CM

## 2024-05-18 DIAGNOSIS — L598 Other specified disorders of the skin and subcutaneous tissue related to radiation: Secondary | ICD-10-CM

## 2024-05-18 MED ORDER — OXYCODONE HCL 5 MG PO TABS: 5.0000 mg | ORAL_TABLET | Freq: Four times a day (QID) | ORAL | 0 refills | Status: DC | PRN

## 2024-05-22 NOTE — Telephone Encounter (Signed)
 Colonoscopy referral has been closed.  Since referral he is now receiving palliative care.  Thanks,  [place], CMA

## 2024-06-01 DIAGNOSIS — Y842 Radiological procedure and radiotherapy as the cause of abnormal reaction of the patient, or of later complication, without mention of misadventure at the time of the procedure: Secondary | ICD-10-CM | POA: Diagnosis not present

## 2024-06-01 DIAGNOSIS — M272 Inflammatory conditions of jaws: Secondary | ICD-10-CM | POA: Diagnosis not present

## 2024-06-19 ENCOUNTER — Inpatient Hospital Stay: Attending: Hematology and Oncology | Admitting: Nurse Practitioner

## 2024-06-19 DIAGNOSIS — Y842 Radiological procedure and radiotherapy as the cause of abnormal reaction of the patient, or of later complication, without mention of misadventure at the time of the procedure: Secondary | ICD-10-CM

## 2024-06-19 DIAGNOSIS — G893 Neoplasm related pain (acute) (chronic): Secondary | ICD-10-CM

## 2024-06-19 DIAGNOSIS — Z515 Encounter for palliative care: Secondary | ICD-10-CM | POA: Diagnosis not present

## 2024-06-19 DIAGNOSIS — L598 Other specified disorders of the skin and subcutaneous tissue related to radiation: Secondary | ICD-10-CM | POA: Diagnosis not present

## 2024-06-19 NOTE — Progress Notes (Unsigned)
 Palliative Medicine [facility]  Telephone:(336) [phone] Fax:(336) [phone]   Name: [name] [name] Date: 06/19/2024 MRN: [id number]  DOB: 17-Jun-1961  Patient Care Team: Wendee Lynwood HERO, NP as PCP - General (Nurse Practitioner) Freddie Lynwood HERO, MD as Consulting Physician (Oncology) Karis Clunes, MD as Consulting Physician (Otolaryngology) Izell Domino, MD as Attending Physician (Radiation Oncology) Maryelizabeth Callander, MD (Inactive) as Consulting Physician (Hematology) Pickenpack-Cousar, Fannie SAILOR, NP as Nurse Practitioner (Nurse Practitioner)   I connected with [name] [name] on 06/19/24 at  4:00 PM EST by telephone and verified that I am speaking with the correct person using two identifiers.   I discussed the limitations, risks, security and privacy concerns of performing an evaluation and management service by telemedicine and the availability of in-person appointments. I also discussed with the patient that there may be a patient responsible charge related to this service. The patient expressed understanding and agreed to proceed.   Other persons participating in the visit and their role in the encounter: N/A   Patient's location: Home   Provider's location: [facility]   INTERVAL HISTORY: [name] [name] is a 63 y.o. 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 Palliative ask to see for symptom management.    SOCIAL HISTORY:     reports that he quit smoking about 5 years ago. His smoking use included cigarettes. He started smoking about 35 years ago. He has a 30 pack-year smoking history. He has never been exposed to tobacco smoke. He has never used smokeless tobacco. He reports that he does not currently use alcohol. He reports that he does not currently use drugs after having used the following drugs: Marijuana.  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   Right SFA 05/07/17 while on warfarin INR 2.9   Atrial fibrillation (HCC)    Benign essential HTN 01/04/2012   Chronic anticoagulation 01/02/2013   Dermatofibroma of forearm 01/02/2013   Left side   DVT, lower extremity, proximal (HCC) 05/24/2013   Right leg femoral & popliteal 05/24/13   Hyperlipidemia, mixed 01/04/2012   Hypothyroidism    Pneumonia    Polycythemia secondary to smoking 01/04/2012   Primary hypercoagulable state 10/01/2014   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HCC)    Sinus bradycardia, chronic 01/04/2012   Superficial thrombosis of lower extremity 05/02/2012    ALLERGIES:  has no known allergies.  MEDICATIONS:  Current Outpatient Medications  Medication Sig Dispense Refill   acetaminophen  (TYLENOL ) 325 MG tablet Take 2 tablets (650 mg total) by mouth every 6 (six) hours.     aspirin  EC 81 MG EC tablet Take 1 tablet (81 mg total) by mouth daily. Swallow whole. 30 tablet 11   atorvastatin  (LIPITOR) 20 MG tablet Take 1 tablet (20 mg total) by mouth daily. 90 tablet 3   cyclobenzaprine  (FLEXERIL ) 10 MG tablet Take 1 tablet (10 mg total) by mouth 3 (three) times daily as needed for muscle spasms. 60 tablet 2   gabapentin  (NEURONTIN ) 100 MG capsule Take by mouth.     ibuprofen (ADVIL) 200 MG tablet Take 200 mg by mouth  every 6 (six) hours as needed.     levothyroxine  (SYNTHROID ) 50 MCG tablet TAKE 1 TABLET BY MOUTH EVERY DAY 90 tablet 0   metoprolol  succinate (TOPROL -XL) 25 MG 24 hr tablet Take 1 tablet (25 mg total) by mouth daily. 90 tablet 3   nystatin  (MYCOSTATIN ) 100000 UNIT/ML suspension Take 5 mLs (500,000 Units total) by mouth 4 (four) times daily. Swish  and spit. Do not swallow. 473 mL 2   oxyCODONE  (OXY IR/ROXICODONE ) 5 MG immediate release tablet Take 1 tablet (5 mg total) by mouth every 6 (six) hours as needed for severe pain (pain score 7-10). 90 tablet 0   pantoprazole  (PROTONIX ) 40 MG tablet Take 1 tablet (40 mg total) by mouth 2 (two) times daily. 60 tablet 1   polyethylene glycol (MIRALAX  / GLYCOLAX ) 17 g packet Take 17 g by mouth 2 (two) times daily. 14 each 0   rivaroxaban  (XARELTO ) 20 MG TABS tablet Take 1 tablet (20 mg total) by mouth daily with supper. 90 tablet 2   No current facility-administered medications for this visit.    VITAL SIGNS: There were no vitals taken for this visit. There were no vitals filed for this visit.  Estimated body mass index is 28.21 kg/m as calculated from the following:   Height as of 12/28/23: 6' 3.5 (1.918 m).   Weight as of 03/16/24: 228 lb 11.2 oz (103.7 kg).   PERFORMANCE STATUS (ECOG) : 1 - Symptomatic but completely ambulatory  IMPRESSION:  I connected with Mr. Arvie by phone for follow-up. No acute distress. Denies nausea, vomiting, constipation, or diarrhea.  Appetite is fair.  Continues to consume mostly soft diet and liquids. Occasional fatigue. Remains active.   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 He is currently taking oxycodone  5 mg every 6 hours as needed for pain, Advil in between, and flexeril  as needed for spasms. Does not require oxycodone  daily. We will continue current regimen with close follow-up.  Taking as directed. No adjustments to current regimen.   We will continue to closely monitor and support as needed.   PLAN: Maintain close follow-up with [facility] s/p extractions Oxycodone  as needed for pain. Not requiring daily Flexeril  as needed for muscle spasms We will continue to closely monitor and assist with symptom management. I will plan to see patient back in 6-8 weeks.  Patient expressed understanding and was in  agreement with this plan. He also understands that He can call the clinic at any time with any questions, concerns, or complaints.      Any controlled substances utilized were prescribed in the context of palliative care. PDMP has been reviewed.   I provided 25 minutes of non face-to-face telephone visit time during this encounter, and > 50% was spent counseling as documented under my assessment & plan. Visit consisted of counseling and education dealing with the complex and emotionally intense issues of symptom management and palliative care in the setting of serious and potentially life-threatening illness.  Levon Borer, AGPCNP-BC  Palliative Medicine Team/[facility]

## 2024-06-20 ENCOUNTER — Encounter: Payer: Self-pay | Admitting: Hematology

## 2024-06-20 ENCOUNTER — Encounter: Payer: Self-pay | Admitting: Nurse Practitioner

## 2024-06-20 MED ORDER — OXYCODONE HCL 5 MG PO TABS: 5.0000 mg | ORAL_TABLET | Freq: Four times a day (QID) | ORAL | 0 refills | Status: DC | PRN

## 2024-07-04 ENCOUNTER — Ambulatory Visit

## 2024-07-04 ENCOUNTER — Ambulatory Visit (HOSPITAL_COMMUNITY)
Admission: RE | Admit: 2024-07-04 | Discharge: 2024-07-04 | Disposition: A | Discharge status: Home / Self Care | Source: Ambulatory Visit | Attending: Physician Assistant | Admitting: Physician Assistant

## 2024-07-04 VITALS — BP 118/88 | HR 93 | Temp 97.8°F | Ht 75.5 in | Wt 233.0 lb

## 2024-07-04 DIAGNOSIS — Z95828 Presence of other vascular implants and grafts: Secondary | ICD-10-CM | POA: Diagnosis not present

## 2024-07-04 DIAGNOSIS — I739 Peripheral vascular disease, unspecified: Secondary | ICD-10-CM | POA: Diagnosis not present

## 2024-07-04 LAB — VAS US ABI WITH/WO TBI
Left ABI: 1.14
Right ABI: 0.66

## 2024-07-05 NOTE — Progress Notes (Signed)
 Office Note   History of Present Illness   [name] [name] is a 63 y.o. (07-02-1961) male who presents today for follow-up.  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  His right femoral to posterior tibial artery bypass was known to be occluded by October 2023.  He returns today for follow-up.  He has no complaints at today's office visit.  He denies any claudication, rest pain, or tissue loss.  He has chronic issues of numbness and tingling in his legs, which is managed with Neurontin.  He is on Xarelto  daily for a history of A-fib and prior recurrent DVT.  Current Outpatient Medications  Medication Sig Dispense Refill   acetaminophen  (TYLENOL ) 325 MG tablet Take 2 tablets (650 mg total) by mouth every 6 (six) hours.     aspirin  EC 81 MG EC tablet Take 1 tablet (81 mg total) by mouth daily. Swallow whole. 30 tablet 11   atorvastatin  (LIPITOR) 20 MG tablet Take 1 tablet (20 mg total) by mouth daily. 90 tablet 3   cyclobenzaprine  (FLEXERIL ) 10 MG tablet Take 1 tablet (10 mg total) by mouth 3 (three) times daily as needed for muscle spasms. 60 tablet 2   gabapentin (NEURONTIN) 100 MG capsule Take by mouth.     ibuprofen (ADVIL) 200 MG tablet Take 200 mg by mouth every 6 (six) hours as needed.     levothyroxine  (SYNTHROID ) 50 MCG tablet TAKE 1 TABLET BY MOUTH EVERY DAY 90 tablet 0   metoprolol  succinate (TOPROL -XL) 25 MG 24 hr tablet Take 1 tablet (25 mg total) by mouth daily. 90 tablet 3   nystatin  (MYCOSTATIN ) 100000 UNIT/ML suspension Take 5 mLs (500,000 Units total) by mouth 4 (four) times daily. Swish and spit. Do not swallow. 473 mL 2   oxyCODONE  (OXY IR/ROXICODONE ) 5 MG immediate release tablet Take 1 tablet (5 mg total) by mouth every 6 (six) hours as needed  for severe pain (pain score 7-10). 90 tablet 0   pantoprazole  (PROTONIX ) 40 MG tablet Take 1 tablet (40 mg total) by mouth 2 (two) times daily. 60 tablet 1   polyethylene glycol (MIRALAX  / GLYCOLAX ) 17 g packet Take 17 g by mouth 2 (two) times daily. 14 each 0   rivaroxaban  (XARELTO ) 20 MG TABS tablet Take 1 tablet (20 mg total) by mouth daily with supper. 90 tablet 2   No current facility-administered medications for this visit.    REVIEW OF SYSTEMS (negative unless checked):   Cardiac:  []  Chest pain or chest pressure? []  Shortness of breath upon activity? []  Shortness of breath when lying flat? []  Irregular heart rhythm?  Vascular:  []  Pain in calf, thigh, or hip brought on by walking? []  Pain in feet at night that wakes you up from your sleep? []  Blood clot in your veins? [x]  Leg swelling?  Pulmonary:  []  Oxygen  at home? []  Productive cough? []  Wheezing?  Neurologic:  []  Sudden weakness in arms or legs? []  Sudden numbness in arms or legs? []  Sudden onset of difficult speaking or slurred speech? []  Temporary loss of vision in one eye? []  Problems with dizziness?  Gastrointestinal:  []  Blood in stool? []  Vomited blood?  Genitourinary:  []  Burning when urinating? []  Blood in urine?  Psychiatric:  []  Major depression  Hematologic:  []   Bleeding problems? []  Problems with blood clotting?  Dermatologic:  []  Rashes or ulcers?  Constitutional:  []  Fever or chills?  Ear/Nose/Throat:  []  Change in hearing? []  Nose bleeds? []  Sore throat?  Musculoskeletal:  []  Back pain? []  Joint pain? []  Muscle pain?   Physical Examination   Vitals:   07/04/24 1453  BP: 118/88  Pulse: 93  Temp: 97.8 F (36.6 C)  SpO2: 94%  Weight: 233 lb (105.7 kg)  Height: 6' 3.5 (1.918 m)   Body mass index is 28.74 kg/m.  General:  WDWN in NAD; vital signs documented above Gait: Not observed HENT: WNL, normocephalic Pulmonary: normal non-labored breathing  Cardiac:  Regular Abdomen: soft, NT, no masses Skin: without rashes Vascular Exam/Pulses: Monophasic right PT Doppler signal.  Brisk left DP/PT Doppler signals Extremities: without ischemic changes, without gangrene , without cellulitis; without open wounds;  Musculoskeletal: no muscle wasting or atrophy  Neurologic: A&O X 3;  No focal weakness or paresthesias are detected Psychiatric:  The pt has Normal affect.  Non-Invasive Vascular imaging   ABI (07/04/2024) R:  ABI: 0.66 (0.55),  PT: mono DP: none TBI:  0 L:  ABI: 1.14 (1.2),  PT: tri DP: tri TBI: 0.86  Medical Decision Making   [name] [name] is a 63 y.o. male who presents for surveillance of PAD  Based on the patient's vascular studies, his ABIs on the right appear slightly improved from 0.55 to 0.66.  His ABIs in the left remain stable at 1.14 He has no new complaints at today's office visit.  He denies any claudication, rest pain, or tissue loss.  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 On exam he has a brisk right PT Doppler signal and brisk left DP/PT Doppler signals He reports intermittent bilateral lower extremity edema, with a history of recurrent DVT.  He has dependent purple discoloration of his feet, likely due to venous pooling. He will continue his Xarelto  and statin and follow-up with our office in 1 year with repeat ABIs   Ahmed Holster PA-C [facility] Office: [phone]  Clinic MD: G[place]

## 2024-07-24 ENCOUNTER — Encounter: Payer: Self-pay | Admitting: Nurse Practitioner

## 2024-07-24 ENCOUNTER — Inpatient Hospital Stay: Attending: Hematology and Oncology | Admitting: Nurse Practitioner

## 2024-07-24 DIAGNOSIS — Y842 Radiological procedure and radiotherapy as the cause of abnormal reaction of the patient, or of later complication, without mention of misadventure at the time of the procedure: Secondary | ICD-10-CM

## 2024-07-24 DIAGNOSIS — G893 Neoplasm related pain (acute) (chronic): Secondary | ICD-10-CM | POA: Diagnosis not present

## 2024-07-24 DIAGNOSIS — C099 Malignant neoplasm of tonsil, unspecified: Secondary | ICD-10-CM | POA: Diagnosis not present

## 2024-07-24 DIAGNOSIS — Z515 Encounter for palliative care: Secondary | ICD-10-CM | POA: Diagnosis not present

## 2024-07-24 DIAGNOSIS — F17210 Nicotine dependence, cigarettes, uncomplicated: Secondary | ICD-10-CM | POA: Diagnosis not present

## 2024-07-24 DIAGNOSIS — C090 Malignant neoplasm of tonsillar fossa: Secondary | ICD-10-CM

## 2024-07-24 MED ORDER — OXYCODONE HCL 5 MG PO TABS: 5.0000 mg | ORAL_TABLET | Freq: Four times a day (QID) | ORAL | 0 refills | Status: DC | PRN

## 2024-07-24 NOTE — Progress Notes (Signed)
 "    Palliative Medicine [facility]  [phone] [phone]   Name: [name] [name] Date: 07/24/2024 MRN: [id number]  DOB: 20-Apr-1961  Patient Care Team: Wendee Lynwood HERO, NP as PCP - General (Nurse Practitioner) Freddie Lynwood HERO, MD as Consulting Physician (Oncology) Karis Clunes, MD as Consulting Physician (Otolaryngology) Izell Domino, MD as Attending Physician (Radiation Oncology) Maryelizabeth Callander, MD (Inactive) as Consulting Physician (Hematology) Pickenpack-Cousar, Fannie SAILOR, NP as Nurse Practitioner (Nurse Practitioner)   I connected with [name] [name] on 07/24/2024 at  4:00 PM EST by telephone and verified that I am speaking with the correct person using two identifiers.   I discussed the limitations, risks, security and privacy concerns of performing an evaluation and management service by telemedicine and the availability of in-person appointments. I also discussed with the patient that there may be a patient responsible charge related to this service. The patient expressed understanding and agreed to proceed.   Other persons participating in the visit and their role in the encounter: N/A   Patients location: Home   Providers location: [facility]   INTERVAL HISTORY: [name] [name] is a 64 y.o. 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 Palliative ask to see for symptom management.    SOCIAL HISTORY:     reports that he quit smoking about 5 years ago. His smoking use included cigarettes. He started smoking about 35 years ago. He has a 30 pack-year smoking history. He has never been exposed to tobacco smoke. He has never used smokeless tobacco. He reports that he does not currently use alcohol. He reports that he does not currently use drugs after having used the following drugs: Marijuana.  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   Right SFA 05/07/17 while on warfarin INR 2.9   Atrial fibrillation (HCC)    Benign essential HTN 01/04/2012   Chronic anticoagulation 01/02/2013   Dermatofibroma of forearm 01/02/2013   Left side   DVT, lower extremity, proximal (HCC) 05/24/2013   Right leg femoral & popliteal 05/24/13   Hyperlipidemia, mixed 01/04/2012   Hypothyroidism    Pneumonia    Polycythemia secondary to smoking 01/04/2012   Primary hypercoagulable state 10/01/2014   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HCC)    Sinus bradycardia, chronic 01/04/2012   Superficial thrombosis of lower extremity 05/02/2012    ALLERGIES:  has no known allergies.  MEDICATIONS:  Current Outpatient Medications  Medication Sig Dispense Refill   acetaminophen  (TYLENOL ) 325 MG tablet Take 2 tablets (650 mg total) by mouth every 6 (six) hours.     aspirin  EC 81 MG EC tablet Take 1 tablet (81 mg total) by mouth daily. Swallow whole. 30 tablet 11   atorvastatin  (LIPITOR) 20 MG tablet Take 1 tablet (20 mg total) by mouth daily. 90 tablet 3   cyclobenzaprine  (FLEXERIL ) 10 MG tablet Take 1 tablet (10 mg total) by mouth 3 (three) times daily as needed for muscle spasms. 60 tablet 2   gabapentin (NEURONTIN) 100 MG capsule Take by mouth.     ibuprofen (ADVIL) 200 MG tablet Take 200 mg by  mouth every 6 (six) hours as needed.     levothyroxine  (SYNTHROID ) 50 MCG tablet TAKE 1 TABLET BY MOUTH EVERY DAY 90 tablet 0   metoprolol  succinate (TOPROL -XL) 25 MG 24 hr tablet Take 1 tablet (25 mg total) by mouth daily. 90 tablet 3   nystatin  (MYCOSTATIN ) 100000 UNIT/ML suspension Take 5 mLs (500,000 Units total) by mouth 4 (four) times daily. Swish  and spit. Do not swallow. 473 mL 2   oxyCODONE  (OXY IR/ROXICODONE ) 5 MG immediate release tablet Take 1 tablet (5 mg total) by mouth every 6 (six) hours as needed for severe pain (pain score 7-10). 90 tablet 0   pantoprazole  (PROTONIX ) 40 MG tablet Take 1 tablet (40 mg total) by mouth 2 (two) times daily. 60 tablet 1   polyethylene glycol (MIRALAX  / GLYCOLAX ) 17 g packet Take 17 g by mouth 2 (two) times daily. 14 each 0   rivaroxaban  (XARELTO ) 20 MG TABS tablet Take 1 tablet (20 mg total) by mouth daily with supper. 90 tablet 2   No current facility-administered medications for this visit.    VITAL SIGNS: There were no vitals taken for this visit. There were no vitals filed for this visit.  Estimated body mass index is 28.74 kg/m as calculated from the following:   Height as of 07/04/24: 6' 3.5 (1.918 m).   Weight as of 07/04/24: 233 lb (105.7 kg).   PERFORMANCE STATUS (ECOG) : 1 - Symptomatic but completely ambulatory  IMPRESSION: Discussed the use of AI scribe software for clinical note transcription with the patient, who gave verbal consent to proceed.  History of Present Illness [name] [name] is a 64 year old male who I connected with by phone for follow-up. No acute distress. Denies nausea, vomiting, constipation, or diarrhea.  Appetite is fair.  Continues to consume mostly soft diet and liquids. Occasional fatigue. Remains active.  He has not had any new appointments or changes in his medical condition recently, but he does have an appointment scheduled in a couple of days with [facility]. He is anxious to see what comes of this.    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 He is currently taking oxycodone  5 mg every 6 hours as needed for pain, Advil in between, and flexeril  as needed for spasms. Does not require oxycodone  daily. We will continue current regimen with close follow-up.  Taking as directed. No adjustments to current  regimen.   We will continue to closely monitor and support as needed.   Assessment & Plan Neoplasm related pain Chronic pain management with oxycodone . He is out of medication and requires a refill. Does not require around the clock use.  - Continue oxycodone  5mg  every 6 hours as needed.   -Maintain close follow-up with [facility] s/p extractions -Oxycodone  as needed for pain. Not requiring daily -Flexeril  as needed for muscle spasms -We will continue to closely monitor and assist with symptom management.  Palliative care management Continued management of symptoms and medication needs. - Scheduled follow-up in 6-8  weeks. - Instructed to call if any issues or need for earlier intervention.   Patient expressed understanding and was in agreement with this plan. He also understands that He can call the clinic at any time with any questions, concerns, or complaints.      Any controlled substances utilized were prescribed in the context of palliative care. PDMP has been reviewed.   I provided 25 minutes of non face-to-face telephone visit time  during this encounter, and > 50% was spent counseling as documented under my assessment & plan. Visit consisted of counseling and education dealing with the complex and emotionally intense issues of symptom management and palliative care in the setting of serious and potentially life-threatening illness.  Levon Borer, AGPCNP-BC  Palliative Medicine Team/ Cancer Center         "

## 2024-08-21 ENCOUNTER — Other Ambulatory Visit: Payer: Self-pay | Admitting: Nurse Practitioner

## 2024-08-21 DIAGNOSIS — E039 Hypothyroidism, unspecified: Secondary | ICD-10-CM

## 2024-08-23 ENCOUNTER — Other Ambulatory Visit: Payer: Self-pay

## 2024-08-23 DIAGNOSIS — L598 Other specified disorders of the skin and subcutaneous tissue related to radiation: Secondary | ICD-10-CM

## 2024-08-23 DIAGNOSIS — Z515 Encounter for palliative care: Secondary | ICD-10-CM

## 2024-08-23 DIAGNOSIS — G893 Neoplasm related pain (acute) (chronic): Secondary | ICD-10-CM

## 2024-08-23 MED ORDER — OXYCODONE HCL 5 MG PO TABS: 5.0000 mg | ORAL_TABLET | Freq: Four times a day (QID) | ORAL | 0 refills | Status: AC | PRN

## 2024-09-11 ENCOUNTER — Inpatient Hospital Stay: Attending: Hematology and Oncology

## 2025-03-22 ENCOUNTER — Ambulatory Visit: Admitting: Hematology and Oncology

## 2025-03-22 ENCOUNTER — Other Ambulatory Visit
